# Patient Record
Sex: Male | Born: 1935 | Race: White | Hispanic: No | Marital: Married | Smoking: Former smoker
Health system: Southern US, Community
[De-identification: ages and names within clinical notes are randomized; demographics above are authoritative.]

## PROBLEM LIST (undated history)

## (undated) DIAGNOSIS — M1990 Unspecified osteoarthritis, unspecified site: Secondary | ICD-10-CM

## (undated) DIAGNOSIS — N401 Enlarged prostate with lower urinary tract symptoms: Secondary | ICD-10-CM

## (undated) DIAGNOSIS — F4321 Adjustment disorder with depressed mood: Secondary | ICD-10-CM

## (undated) DIAGNOSIS — E785 Hyperlipidemia, unspecified: Secondary | ICD-10-CM

## (undated) DIAGNOSIS — K254 Chronic or unspecified gastric ulcer with hemorrhage: Secondary | ICD-10-CM

## (undated) DIAGNOSIS — Z8719 Personal history of other diseases of the digestive system: Secondary | ICD-10-CM

## (undated) DIAGNOSIS — G20A1 Parkinson's disease without dyskinesia, without mention of fluctuations: Secondary | ICD-10-CM

## (undated) DIAGNOSIS — M459 Ankylosing spondylitis of unspecified sites in spine: Secondary | ICD-10-CM

## (undated) DIAGNOSIS — K2211 Ulcer of esophagus with bleeding: Secondary | ICD-10-CM

## (undated) DIAGNOSIS — K219 Gastro-esophageal reflux disease without esophagitis: Secondary | ICD-10-CM

## (undated) DIAGNOSIS — K264 Chronic or unspecified duodenal ulcer with hemorrhage: Secondary | ICD-10-CM

## (undated) DIAGNOSIS — Z9289 Personal history of other medical treatment: Secondary | ICD-10-CM

## (undated) DIAGNOSIS — K279 Peptic ulcer, site unspecified, unspecified as acute or chronic, without hemorrhage or perforation: Secondary | ICD-10-CM

## (undated) DIAGNOSIS — R7309 Other abnormal glucose: Secondary | ICD-10-CM

## (undated) DIAGNOSIS — G459 Transient cerebral ischemic attack, unspecified: Secondary | ICD-10-CM

## (undated) DIAGNOSIS — N200 Calculus of kidney: Secondary | ICD-10-CM

## (undated) DIAGNOSIS — I10 Essential (primary) hypertension: Secondary | ICD-10-CM

## (undated) HISTORY — DX: Hyperlipidemia, unspecified: E78.5

## (undated) HISTORY — DX: Transient cerebral ischemic attack, unspecified: G45.9

## (undated) HISTORY — DX: Other abnormal glucose: R73.09

## (undated) HISTORY — DX: Gastro-esophageal reflux disease without esophagitis: K21.9

## (undated) HISTORY — DX: Unspecified osteoarthritis, unspecified site: M19.90

## (undated) HISTORY — DX: Essential (primary) hypertension: I10

## (undated) HISTORY — DX: Peptic ulcer, site unspecified, unspecified as acute or chronic, without hemorrhage or perforation: K27.9

## (undated) HISTORY — DX: Benign prostatic hyperplasia with lower urinary tract symptoms: N40.1

---

## 1986-08-26 DIAGNOSIS — Z9289 Personal history of other medical treatment: Secondary | ICD-10-CM

## 1986-08-26 HISTORY — DX: Personal history of other medical treatment: Z92.89

## 1998-04-17 ENCOUNTER — Ambulatory Visit (HOSPITAL_COMMUNITY): Admission: RE | Admit: 1998-04-17 | Discharge: 1998-04-17 | Payer: Self-pay | Admitting: Gastroenterology

## 2002-01-10 ENCOUNTER — Encounter: Payer: Self-pay | Admitting: Emergency Medicine

## 2002-01-10 ENCOUNTER — Emergency Department (HOSPITAL_COMMUNITY): Admission: EM | Admit: 2002-01-10 | Discharge: 2002-01-10 | Payer: Self-pay | Admitting: Emergency Medicine

## 2002-01-12 ENCOUNTER — Encounter: Payer: Self-pay | Admitting: Neurology

## 2002-01-12 ENCOUNTER — Ambulatory Visit (HOSPITAL_COMMUNITY): Admission: RE | Admit: 2002-01-12 | Discharge: 2002-01-12 | Payer: Self-pay | Admitting: Neurology

## 2003-03-28 ENCOUNTER — Ambulatory Visit (HOSPITAL_COMMUNITY): Admission: RE | Admit: 2003-03-28 | Discharge: 2003-03-28 | Payer: Self-pay | Admitting: Gastroenterology

## 2005-07-12 ENCOUNTER — Ambulatory Visit: Payer: Self-pay | Admitting: Internal Medicine

## 2005-09-02 ENCOUNTER — Ambulatory Visit: Payer: Self-pay | Admitting: Internal Medicine

## 2006-03-25 ENCOUNTER — Ambulatory Visit: Payer: Self-pay | Admitting: Internal Medicine

## 2006-03-31 ENCOUNTER — Ambulatory Visit: Payer: Self-pay | Admitting: Internal Medicine

## 2006-04-24 ENCOUNTER — Ambulatory Visit: Payer: Self-pay | Admitting: Internal Medicine

## 2006-10-17 ENCOUNTER — Ambulatory Visit: Payer: Self-pay | Admitting: Internal Medicine

## 2007-11-04 ENCOUNTER — Ambulatory Visit: Payer: Self-pay | Admitting: Vascular Surgery

## 2007-11-04 ENCOUNTER — Inpatient Hospital Stay (HOSPITAL_COMMUNITY): Admission: EM | Admit: 2007-11-04 | Discharge: 2007-11-04 | Payer: Self-pay | Admitting: Emergency Medicine

## 2007-11-04 ENCOUNTER — Encounter (INDEPENDENT_AMBULATORY_CARE_PROVIDER_SITE_OTHER): Payer: Self-pay | Admitting: Pediatrics

## 2007-11-04 ENCOUNTER — Ambulatory Visit: Payer: Self-pay | Admitting: Cardiology

## 2007-11-17 DIAGNOSIS — Z87442 Personal history of urinary calculi: Secondary | ICD-10-CM | POA: Insufficient documentation

## 2007-11-17 DIAGNOSIS — K279 Peptic ulcer, site unspecified, unspecified as acute or chronic, without hemorrhage or perforation: Secondary | ICD-10-CM

## 2007-11-17 HISTORY — DX: Peptic ulcer, site unspecified, unspecified as acute or chronic, without hemorrhage or perforation: K27.9

## 2007-11-18 ENCOUNTER — Ambulatory Visit: Payer: Self-pay | Admitting: Internal Medicine

## 2007-11-18 DIAGNOSIS — R7309 Other abnormal glucose: Secondary | ICD-10-CM

## 2007-11-18 HISTORY — DX: Other abnormal glucose: R73.09

## 2007-11-20 ENCOUNTER — Ambulatory Visit: Payer: Self-pay | Admitting: Internal Medicine

## 2007-11-23 LAB — CONVERTED CEMR LAB
ALT: 27 units/L (ref 0–53)
AST: 31 units/L (ref 0–37)
Albumin: 4.1 g/dL (ref 3.5–5.2)
Alkaline Phosphatase: 48 units/L (ref 39–117)
BUN: 17 mg/dL (ref 6–23)
Bilirubin, Direct: 0.2 mg/dL (ref 0.0–0.3)
CO2: 30 meq/L (ref 19–32)
Calcium: 9.6 mg/dL (ref 8.4–10.5)
Chloride: 105 meq/L (ref 96–112)
Cholesterol: 177 mg/dL (ref 0–200)
Creatinine, Ser: 1 mg/dL (ref 0.4–1.5)
GFR calc Af Amer: 95 mL/min
GFR calc non Af Amer: 79 mL/min
Glucose, Bld: 100 mg/dL — ABNORMAL HIGH (ref 70–99)
HDL: 33.1 mg/dL — ABNORMAL LOW (ref >39.0)
Hgb A1c MFr Bld: 5.6 % (ref 4.6–6.0)
LDL Cholesterol: 127 mg/dL — ABNORMAL HIGH (ref 0–99)
PSA: 0.27 ng/mL (ref 0.10–4.00)
Potassium: 5 meq/L (ref 3.5–5.1)
Sodium: 140 meq/L (ref 135–145)
Total Bilirubin: 1 mg/dL (ref 0.3–1.2)
Total CHOL/HDL Ratio: 5.3
Total Protein: 6.2 g/dL (ref 6.0–8.3)
Triglycerides: 83 mg/dL (ref 0–149)
VLDL: 17 mg/dL (ref 0–40)

## 2007-12-21 ENCOUNTER — Telehealth (INDEPENDENT_AMBULATORY_CARE_PROVIDER_SITE_OTHER): Payer: Self-pay | Admitting: *Deleted

## 2008-02-08 ENCOUNTER — Ambulatory Visit: Payer: Self-pay | Admitting: Internal Medicine

## 2008-02-08 LAB — CONVERTED CEMR LAB
ALT: 24 units/L (ref 0–53)
AST: 20 units/L (ref 0–37)
Albumin: 4 g/dL (ref 3.5–5.2)
Alkaline Phosphatase: 39 units/L (ref 39–117)
Bilirubin, Direct: 0.2 mg/dL (ref 0.0–0.3)
Cholesterol: 105 mg/dL (ref 0–200)
HDL: 35.4 mg/dL — ABNORMAL LOW (ref >39.0)
LDL Cholesterol: 57 mg/dL (ref 0–99)
Total Bilirubin: 1.2 mg/dL (ref 0.3–1.2)
Total CHOL/HDL Ratio: 3
Total Protein: 5.9 g/dL — ABNORMAL LOW (ref 6.0–8.3)
Triglycerides: 61 mg/dL (ref 0–149)
VLDL: 12 mg/dL (ref 0–40)

## 2008-02-22 ENCOUNTER — Ambulatory Visit: Payer: Self-pay | Admitting: Internal Medicine

## 2008-02-22 DIAGNOSIS — E785 Hyperlipidemia, unspecified: Secondary | ICD-10-CM

## 2008-02-22 DIAGNOSIS — K219 Gastro-esophageal reflux disease without esophagitis: Secondary | ICD-10-CM | POA: Insufficient documentation

## 2008-02-22 HISTORY — DX: Gastro-esophageal reflux disease without esophagitis: K21.9

## 2008-02-22 HISTORY — DX: Hyperlipidemia, unspecified: E78.5

## 2008-02-23 ENCOUNTER — Ambulatory Visit: Payer: Self-pay | Admitting: Internal Medicine

## 2008-07-26 ENCOUNTER — Encounter: Payer: Self-pay | Admitting: Internal Medicine

## 2008-08-09 ENCOUNTER — Ambulatory Visit: Payer: Self-pay | Admitting: Internal Medicine

## 2008-08-09 LAB — CONVERTED CEMR LAB
ALT: 26 units/L (ref 0–53)
AST: 25 units/L (ref 0–37)
Albumin: 4.2 g/dL (ref 3.5–5.2)
Alkaline Phosphatase: 48 units/L (ref 39–117)
BUN: 20 mg/dL (ref 6–23)
Bilirubin, Direct: 0.2 mg/dL (ref 0.0–0.3)
CO2: 31 meq/L (ref 19–32)
Calcium: 9.6 mg/dL (ref 8.4–10.5)
Chloride: 106 meq/L (ref 96–112)
Cholesterol: 120 mg/dL (ref 0–200)
Creatinine, Ser: 1 mg/dL (ref 0.4–1.5)
GFR calc Af Amer: 95 mL/min
GFR calc non Af Amer: 79 mL/min
Glucose, Bld: 98 mg/dL (ref 70–99)
HDL: 45.4 mg/dL (ref >39.0)
Hgb A1c MFr Bld: 5.7 % (ref 4.6–6.0)
LDL Cholesterol: 63 mg/dL (ref 0–99)
Potassium: 4.7 meq/L (ref 3.5–5.1)
Sodium: 143 meq/L (ref 135–145)
Total Bilirubin: 1.1 mg/dL (ref 0.3–1.2)
Total CHOL/HDL Ratio: 2.6
Total Protein: 6.3 g/dL (ref 6.0–8.3)
Triglycerides: 57 mg/dL (ref 0–149)
VLDL: 11 mg/dL (ref 0–40)

## 2008-08-29 ENCOUNTER — Ambulatory Visit: Payer: Self-pay | Admitting: Internal Medicine

## 2008-08-29 LAB — HM COLONOSCOPY

## 2008-09-15 ENCOUNTER — Encounter: Payer: Self-pay | Admitting: Internal Medicine

## 2009-02-28 ENCOUNTER — Ambulatory Visit: Payer: Self-pay | Admitting: Internal Medicine

## 2009-02-28 LAB — CONVERTED CEMR LAB
ALT: 20 units/L (ref 0–53)
AST: 17 units/L (ref 0–37)
Albumin: 4 g/dL (ref 3.5–5.2)
Alkaline Phosphatase: 47 units/L (ref 39–117)
BUN: 15 mg/dL (ref 6–23)
Bilirubin, Direct: 0.2 mg/dL (ref 0.0–0.3)
CO2: 29 meq/L (ref 19–32)
Calcium: 9.2 mg/dL (ref 8.4–10.5)
Chloride: 105 meq/L (ref 96–112)
Cholesterol: 113 mg/dL (ref 0–200)
Creatinine, Ser: 1 mg/dL (ref 0.4–1.5)
GFR calc non Af Amer: 78.21 mL/min (ref >60)
Glucose, Bld: 88 mg/dL (ref 70–99)
HDL: 43.4 mg/dL (ref >39.00)
Hgb A1c MFr Bld: 5.6 % (ref 4.6–6.5)
LDL Cholesterol: 60 mg/dL (ref 0–99)
Potassium: 4 meq/L (ref 3.5–5.1)
Sodium: 140 meq/L (ref 135–145)
Total Bilirubin: 1.5 mg/dL — ABNORMAL HIGH (ref 0.3–1.2)
Total CHOL/HDL Ratio: 3
Total Protein: 6.3 g/dL (ref 6.0–8.3)
Triglycerides: 46 mg/dL (ref 0.0–149.0)
VLDL: 9.2 mg/dL (ref 0.0–40.0)

## 2009-03-08 ENCOUNTER — Ambulatory Visit: Payer: Self-pay | Admitting: Internal Medicine

## 2009-05-26 ENCOUNTER — Telehealth (INDEPENDENT_AMBULATORY_CARE_PROVIDER_SITE_OTHER): Payer: Self-pay | Admitting: *Deleted

## 2009-08-24 ENCOUNTER — Telehealth: Payer: Self-pay | Admitting: Internal Medicine

## 2009-09-07 ENCOUNTER — Ambulatory Visit: Payer: Self-pay | Admitting: Internal Medicine

## 2009-09-07 LAB — CONVERTED CEMR LAB
ALT: 29 units/L (ref 0–53)
AST: 23 units/L (ref 0–37)
Albumin: 4.5 g/dL (ref 3.5–5.2)
Alkaline Phosphatase: 50 units/L (ref 39–117)
BUN: 14 mg/dL (ref 6–23)
Bilirubin, Direct: 0.2 mg/dL (ref 0.0–0.3)
CO2: 29 meq/L (ref 19–32)
Calcium: 9.3 mg/dL (ref 8.4–10.5)
Chloride: 100 meq/L (ref 96–112)
Cholesterol: 116 mg/dL (ref 0–200)
Creatinine, Ser: 1 mg/dL (ref 0.4–1.5)
GFR calc non Af Amer: 78.1 mL/min (ref >60)
Glucose, Bld: 94 mg/dL (ref 70–99)
HDL: 41.7 mg/dL (ref >39.00)
Hgb A1c MFr Bld: 5.5 % (ref 4.6–6.5)
LDL Cholesterol: 61 mg/dL (ref 0–99)
Potassium: 4.3 meq/L (ref 3.5–5.1)
Sodium: 135 meq/L (ref 135–145)
Total Bilirubin: 1.3 mg/dL — ABNORMAL HIGH (ref 0.3–1.2)
Total CHOL/HDL Ratio: 3
Total Protein: 7.1 g/dL (ref 6.0–8.3)
Triglycerides: 67 mg/dL (ref 0.0–149.0)
VLDL: 13.4 mg/dL (ref 0.0–40.0)

## 2009-09-14 ENCOUNTER — Ambulatory Visit: Payer: Self-pay | Admitting: Internal Medicine

## 2009-09-18 ENCOUNTER — Telehealth (INDEPENDENT_AMBULATORY_CARE_PROVIDER_SITE_OTHER): Payer: Self-pay | Admitting: *Deleted

## 2009-09-19 ENCOUNTER — Ambulatory Visit: Payer: Self-pay | Admitting: Cardiovascular Disease

## 2009-09-19 ENCOUNTER — Encounter (HOSPITAL_COMMUNITY): Admission: RE | Admit: 2009-09-19 | Discharge: 2009-11-29 | Payer: Self-pay | Admitting: Internal Medicine

## 2009-09-19 ENCOUNTER — Ambulatory Visit: Payer: Self-pay

## 2009-10-17 ENCOUNTER — Ambulatory Visit: Payer: Self-pay | Admitting: Cardiology

## 2009-10-24 ENCOUNTER — Telehealth: Payer: Self-pay | Admitting: Cardiology

## 2009-10-24 ENCOUNTER — Ambulatory Visit (HOSPITAL_COMMUNITY): Admission: RE | Admit: 2009-10-24 | Discharge: 2009-10-24 | Payer: Self-pay | Admitting: Cardiology

## 2009-10-25 ENCOUNTER — Telehealth: Payer: Self-pay | Admitting: Cardiology

## 2009-11-01 ENCOUNTER — Telehealth: Payer: Self-pay | Admitting: Cardiology

## 2009-11-03 ENCOUNTER — Ambulatory Visit: Payer: Self-pay | Admitting: Cardiology

## 2009-11-03 ENCOUNTER — Ambulatory Visit: Payer: Self-pay

## 2009-11-03 ENCOUNTER — Ambulatory Visit (HOSPITAL_COMMUNITY): Admission: RE | Admit: 2009-11-03 | Discharge: 2009-11-03 | Payer: Self-pay | Admitting: Cardiology

## 2009-11-03 ENCOUNTER — Encounter: Payer: Self-pay | Admitting: Cardiology

## 2009-11-05 LAB — CONVERTED CEMR LAB
BUN: 18 mg/dL (ref 6–23)
CO2: 28 meq/L (ref 19–32)
Calcium: 9.8 mg/dL (ref 8.4–10.5)
Chloride: 105 meq/L (ref 96–112)
Creatinine, Ser: 1.2 mg/dL (ref 0.4–1.5)
GFR calc non Af Amer: 63.25 mL/min (ref >60)
Glucose, Bld: 107 mg/dL — ABNORMAL HIGH (ref 70–99)
Potassium: 4 meq/L (ref 3.5–5.1)
Sodium: 139 meq/L (ref 135–145)

## 2009-11-06 ENCOUNTER — Ambulatory Visit: Payer: Self-pay | Admitting: Internal Medicine

## 2009-11-08 ENCOUNTER — Telehealth: Payer: Self-pay | Admitting: Cardiology

## 2009-11-10 ENCOUNTER — Ambulatory Visit: Payer: Self-pay | Admitting: Cardiology

## 2009-11-10 DIAGNOSIS — I10 Essential (primary) hypertension: Secondary | ICD-10-CM

## 2009-11-10 HISTORY — DX: Essential (primary) hypertension: I10

## 2009-11-23 ENCOUNTER — Telehealth: Payer: Self-pay | Admitting: Cardiology

## 2010-01-15 ENCOUNTER — Ambulatory Visit: Payer: Self-pay | Admitting: Internal Medicine

## 2010-01-15 DIAGNOSIS — N401 Enlarged prostate with lower urinary tract symptoms: Secondary | ICD-10-CM

## 2010-01-15 DIAGNOSIS — N138 Other obstructive and reflux uropathy: Secondary | ICD-10-CM | POA: Insufficient documentation

## 2010-01-15 HISTORY — DX: Other obstructive and reflux uropathy: N13.8

## 2010-01-15 HISTORY — DX: Benign prostatic hyperplasia with lower urinary tract symptoms: N40.1

## 2010-01-15 LAB — CONVERTED CEMR LAB
Bilirubin Urine: NEGATIVE
Blood in Urine, dipstick: NEGATIVE
Glucose, Urine, Semiquant: NEGATIVE
Ketones, urine, test strip: NEGATIVE
Nitrite: NEGATIVE
Protein, U semiquant: NEGATIVE
Specific Gravity, Urine: 1.02
Urobilinogen, UA: 0.2
WBC Urine, dipstick: NEGATIVE
pH: 7

## 2010-01-16 ENCOUNTER — Encounter: Payer: Self-pay | Admitting: Internal Medicine

## 2010-01-16 LAB — CONVERTED CEMR LAB: PSA: 0.38 ng/mL (ref 0.10–4.00)

## 2010-02-16 ENCOUNTER — Ambulatory Visit: Payer: Self-pay | Admitting: Internal Medicine

## 2010-04-02 ENCOUNTER — Encounter: Payer: Self-pay | Admitting: Internal Medicine

## 2010-04-06 ENCOUNTER — Encounter: Admission: RE | Admit: 2010-04-06 | Discharge: 2010-04-06 | Payer: Self-pay | Admitting: Gastroenterology

## 2010-05-01 ENCOUNTER — Encounter: Payer: Self-pay | Admitting: Internal Medicine

## 2010-05-04 ENCOUNTER — Telehealth: Payer: Self-pay | Admitting: Cardiology

## 2010-05-04 ENCOUNTER — Ambulatory Visit: Payer: Self-pay | Admitting: Cardiology

## 2010-06-22 ENCOUNTER — Ambulatory Visit: Payer: Self-pay | Admitting: Internal Medicine

## 2010-06-22 LAB — CONVERTED CEMR LAB
ALT: 20 units/L (ref 0–53)
AST: 19 units/L (ref 0–37)
Albumin: 4.4 g/dL (ref 3.5–5.2)
Alkaline Phosphatase: 50 units/L (ref 39–117)
BUN: 26 mg/dL — ABNORMAL HIGH (ref 6–23)
Bilirubin, Direct: 0.1 mg/dL (ref 0.0–0.3)
CO2: 29 meq/L (ref 19–32)
Calcium: 9.8 mg/dL (ref 8.4–10.5)
Chloride: 103 meq/L (ref 96–112)
Cholesterol: 165 mg/dL (ref 0–200)
Creatinine, Ser: 1.1 mg/dL (ref 0.4–1.5)
GFR calc non Af Amer: 69.08 mL/min (ref >60)
Glucose, Bld: 94 mg/dL (ref 70–99)
HDL: 50.3 mg/dL (ref >39.00)
Hgb A1c MFr Bld: 5.5 % (ref 4.6–6.5)
LDL Cholesterol: 104 mg/dL — ABNORMAL HIGH (ref 0–99)
Potassium: 4.7 meq/L (ref 3.5–5.1)
Sodium: 138 meq/L (ref 135–145)
TSH: 4.64 microintl units/mL (ref 0.35–5.50)
Total Bilirubin: 1.2 mg/dL (ref 0.3–1.2)
Total CHOL/HDL Ratio: 3
Total Protein: 6.8 g/dL (ref 6.0–8.3)
Triglycerides: 55 mg/dL (ref 0.0–149.0)
VLDL: 11 mg/dL (ref 0.0–40.0)

## 2010-09-11 ENCOUNTER — Inpatient Hospital Stay (HOSPITAL_COMMUNITY)
Admission: EM | Admit: 2010-09-11 | Discharge: 2010-09-13 | Payer: Self-pay | Source: Home / Self Care | Attending: Internal Medicine | Admitting: Internal Medicine

## 2010-09-12 ENCOUNTER — Encounter (INDEPENDENT_AMBULATORY_CARE_PROVIDER_SITE_OTHER): Payer: Self-pay | Admitting: Internal Medicine

## 2010-09-12 LAB — COMPREHENSIVE METABOLIC PANEL
ALT: 17 U/L (ref 0–53)
AST: 20 U/L (ref 0–37)
Albumin: 4.3 g/dL (ref 3.5–5.2)
Alkaline Phosphatase: 48 U/L (ref 39–117)
BUN: 15 mg/dL (ref 6–23)
CO2: 27 mEq/L (ref 19–32)
Calcium: 9.8 mg/dL (ref 8.4–10.5)
Chloride: 99 mEq/L (ref 96–112)
Creatinine, Ser: 1.15 mg/dL (ref 0.4–1.5)
GFR calc Af Amer: >60 mL/min (ref >60)
GFR calc non Af Amer: >60 mL/min (ref >60)
Glucose, Bld: 109 mg/dL — ABNORMAL HIGH (ref 70–99)
Potassium: 4 mEq/L (ref 3.5–5.1)
Sodium: 136 mEq/L (ref 135–145)
Total Bilirubin: 1.8 mg/dL — ABNORMAL HIGH (ref 0.3–1.2)
Total Protein: 6.7 g/dL (ref 6.0–8.3)

## 2010-09-12 LAB — POCT CARDIAC MARKERS
CKMB, poc: <1 ng/mL — ABNORMAL LOW (ref 1.0–8.0)
Myoglobin, poc: 70.9 ng/mL (ref 12–200)
Troponin i, poc: <0.05 ng/mL (ref 0.00–0.09)

## 2010-09-12 LAB — DIFFERENTIAL
Basophils Absolute: 0 10*3/uL (ref 0.0–0.1)
Basophils Relative: 0 % (ref 0–1)
Eosinophils Absolute: 0 10*3/uL (ref 0.0–0.7)
Eosinophils Relative: 0 % (ref 0–5)
Lymphocytes Relative: 22 % (ref 12–46)
Lymphs Abs: 1.7 10*3/uL (ref 0.7–4.0)
Monocytes Absolute: 0.6 10*3/uL (ref 0.1–1.0)
Monocytes Relative: 8 % (ref 3–12)
Neutro Abs: 5.2 10*3/uL (ref 1.7–7.7)
Neutrophils Relative %: 69 % (ref 43–77)

## 2010-09-12 LAB — CBC
HCT: 40.5 % (ref 39.0–52.0)
Hemoglobin: 13.8 g/dL (ref 13.0–17.0)
MCH: 32 pg (ref 26.0–34.0)
MCHC: 34.1 g/dL (ref 30.0–36.0)
MCV: 94 fL (ref 78.0–100.0)
Platelets: 224 10*3/uL (ref 150–400)
RBC: 4.31 MIL/uL (ref 4.22–5.81)
RDW: 12.1 % (ref 11.5–15.5)
WBC: 7.5 10*3/uL (ref 4.0–10.5)

## 2010-09-12 LAB — LIPASE, BLOOD: Lipase: 21 U/L (ref 11–59)

## 2010-09-13 ENCOUNTER — Encounter: Payer: Self-pay | Admitting: Internal Medicine

## 2010-09-17 LAB — DIFFERENTIAL
Basophils Absolute: 0 10*3/uL (ref 0.0–0.1)
Basophils Relative: 0 % (ref 0–1)
Eosinophils Absolute: 0 10*3/uL (ref 0.0–0.7)
Eosinophils Relative: 1 % (ref 0–5)
Lymphocytes Relative: 30 % (ref 12–46)
Lymphs Abs: 1.8 10*3/uL (ref 0.7–4.0)
Monocytes Absolute: 0.6 10*3/uL (ref 0.1–1.0)
Monocytes Relative: 10 % (ref 3–12)
Neutro Abs: 3.5 10*3/uL (ref 1.7–7.7)
Neutrophils Relative %: 59 % (ref 43–77)

## 2010-09-17 LAB — CARDIAC PANEL(CRET KIN+CKTOT+MB+TROPI)
CK, MB: 2.5 ng/mL (ref 0.3–4.0)
CK, MB: 2.2 ng/mL (ref 0.3–4.0)
Relative Index: INVALID (ref 0.0–2.5)
Relative Index: INVALID (ref 0.0–2.5)
Total CK: 60 U/L (ref 7–232)
Total CK: 58 U/L (ref 7–232)
Troponin I: 0.01 ng/mL (ref 0.00–0.06)
Troponin I: 0.01 ng/mL (ref 0.00–0.06)

## 2010-09-17 LAB — LIPID PANEL
Cholesterol: 161 mg/dL (ref 0–200)
HDL: 50 mg/dL (ref >39)
LDL Cholesterol: 100 mg/dL — ABNORMAL HIGH (ref 0–99)
Total CHOL/HDL Ratio: 3.2 RATIO
Triglycerides: 55 mg/dL (ref <150)
VLDL: 11 mg/dL (ref 0–40)

## 2010-09-17 LAB — OCCULT BLOOD, POC DEVICE
Fecal Occult Bld: NEGATIVE
Fecal Occult Bld: NEGATIVE

## 2010-09-17 LAB — COMPREHENSIVE METABOLIC PANEL
ALT: 16 U/L (ref 0–53)
AST: 18 U/L (ref 0–37)
Albumin: 3.9 g/dL (ref 3.5–5.2)
Alkaline Phosphatase: 43 U/L (ref 39–117)
BUN: 14 mg/dL (ref 6–23)
CO2: 27 mEq/L (ref 19–32)
Calcium: 9.3 mg/dL (ref 8.4–10.5)
Chloride: 106 mEq/L (ref 96–112)
Creatinine, Ser: 1.06 mg/dL (ref 0.4–1.5)
GFR calc Af Amer: >60 mL/min (ref >60)
GFR calc non Af Amer: >60 mL/min (ref >60)
Glucose, Bld: 91 mg/dL (ref 70–99)
Potassium: 4.4 mEq/L (ref 3.5–5.1)
Sodium: 139 mEq/L (ref 135–145)
Total Bilirubin: 1.9 mg/dL — ABNORMAL HIGH (ref 0.3–1.2)
Total Protein: 6 g/dL (ref 6.0–8.3)

## 2010-09-17 LAB — BASIC METABOLIC PANEL
BUN: 13 mg/dL (ref 6–23)
CO2: 27 mEq/L (ref 19–32)
Calcium: 8.7 mg/dL (ref 8.4–10.5)
Chloride: 109 mEq/L (ref 96–112)
Creatinine, Ser: 1.02 mg/dL (ref 0.4–1.5)
GFR calc Af Amer: >60 mL/min (ref >60)
GFR calc non Af Amer: >60 mL/min (ref >60)
Glucose, Bld: 94 mg/dL (ref 70–99)
Potassium: 4.6 mEq/L (ref 3.5–5.1)
Sodium: 139 mEq/L (ref 135–145)

## 2010-09-17 LAB — CBC
HCT: 38.7 % — ABNORMAL LOW (ref 39.0–52.0)
HCT: 32.7 % — ABNORMAL LOW (ref 39.0–52.0)
Hemoglobin: 13.2 g/dL (ref 13.0–17.0)
Hemoglobin: 11.3 g/dL — ABNORMAL LOW (ref 13.0–17.0)
MCH: 32.1 pg (ref 26.0–34.0)
MCH: 32.8 pg (ref 26.0–34.0)
MCHC: 34.1 g/dL (ref 30.0–36.0)
MCHC: 34.6 g/dL (ref 30.0–36.0)
MCV: 94.2 fL (ref 78.0–100.0)
MCV: 94.8 fL (ref 78.0–100.0)
Platelets: 202 10*3/uL (ref 150–400)
Platelets: 177 10*3/uL (ref 150–400)
RBC: 4.11 MIL/uL — ABNORMAL LOW (ref 4.22–5.81)
RBC: 3.45 MIL/uL — ABNORMAL LOW (ref 4.22–5.81)
RDW: 12.3 % (ref 11.5–15.5)
RDW: 12.6 % (ref 11.5–15.5)
WBC: 5.9 10*3/uL (ref 4.0–10.5)
WBC: 4.7 10*3/uL (ref 4.0–10.5)

## 2010-09-17 LAB — TSH: TSH: 5.587 u[IU]/mL — ABNORMAL HIGH (ref 0.350–4.500)

## 2010-09-17 LAB — HEMOGLOBIN A1C
Hgb A1c MFr Bld: 5.6 % (ref <5.7)
Mean Plasma Glucose: 114 mg/dL (ref <117)

## 2010-09-17 LAB — PHOSPHORUS: Phosphorus: 3.3 mg/dL (ref 2.3–4.6)

## 2010-09-17 LAB — CK TOTAL AND CKMB (NOT AT ARMC)
CK, MB: 2.7 ng/mL (ref 0.3–4.0)
Relative Index: INVALID (ref 0.0–2.5)
Total CK: 68 U/L (ref 7–232)

## 2010-09-17 LAB — MAGNESIUM: Magnesium: 2 mg/dL (ref 1.5–2.5)

## 2010-09-17 LAB — TROPONIN I: Troponin I: <0.01 ng/mL (ref 0.00–0.06)

## 2010-09-22 NOTE — H&P (Signed)
[name], [name]                 ACCOUNT NO.:  [id number]  MEDICAL RECORD NO.:  [id number]          PATIENT TYPE:  EMS  LOCATION:  MAJO                         FACILITY:  [facility]  PHYSICIAN:  Michiel Cowboy, MDDATE OF BIRTH:  08-05-1936  DATE OF ADMISSION:  09/11/2010 DATE OF DISCHARGE:                             HISTORY & PHYSICAL   PRIMARY CARE PROVIDER:  Valetta Mole. Swords, MD  CHIEF COMPLAINT:  Chest pressure.  The patient is a 75 year old gentleman who for the past 1 year having intermittent episodes of nausea, early satiety, dyspepsia that go on for couple of days and then resolve.  This happens every other week or so every couple weeks.  He thinks it is secondary to this.  He has lost almost 30 pounds in the past year.  He was supposed to go see Dr. Corliss Marcus about this and have an appointment scheduled for tomorrow. Regarding his chest pressure, this started only today.  He has not had any shortness of breath.  It is completely resolved now.  It was nonexertional, occurred at rest, and was mild, but it got him scared so he went to the emergency department.  Otherwise, no diarrhea.  Nausea is currently improved, although he does not still have much of an appetite. Otherwise, review of systems is negative.  No fevers, no chills, no chest pain per se.  Otherwise, review of systems is negative.  PAST MEDICAL HISTORY:  Significant for: 1. Hypertension. 2. GERD. 3. Remote GI bleeding in 1988 secondary to peptic ulcer disease and     history of esophagitis.  SOCIAL HISTORY:  The patient used to smoke but quit 30 years ago. Drinks very rarely alcohol.  Does not abuse drugs.  FAMILY HISTORY:  Significant for father with diabetes and colon cancer.  ALLERGIES:  None.  MEDICATIONS: 1. Lisinopril 20 mg daily. 2. Prilosec 20 mg daily. 3. Aspirin 81 mg a day.  PHYSICAL EXAMINATION:  VITAL SIGNS:  Temperature 98.1; blood pressure 165/72; pulse initially was 113, now  down to 94; respirations 20; saturating 94% on room air. GENERAL:  The patient appears to be currently in no acute distress. HEENT:  Head is nontraumatic.  Dry mucous membranes.  Decreased skin turgor. LUNGS:  Good aeration bilaterally.  Clear to auscultation bilaterally. HEART:  Regular rate and rhythm.  No murmurs appreciated. ABDOMEN:  Soft, nontender, and nondistended. EXTREMITIES:  Lower extremities without clubbing, cyanosis, or edema. NEUROLOGIC:  Grossly intact.  LABORATORY DATA:  White blood cell count 7.5 and hemoglobin 13.8. Sodium 136, potassium 4.0, creatinine 1.15.  Total bilirubin slightly elevated at 1.8, but otherwise LFTs within normal limits.  Albumin 4.3 and lipase 21.  Cardiac enzymes unremarkable.  Chest x-ray is not showing abnormalities.  Hemoccult negative.  EKG initially shows heart rate of 122, but no evidence of ischemia.  ASSESSMENT AND PLAN:  This is a 75 year old gentleman with intermittent nausea of unclear etiology and now chest pain.  The patient does appear to be also mildly dehydrated. 1. Chest pressure.  given his age and history of hypertension, we will     cycle cardiac  enzymes.  He has had a stress test done by [facility]     Cardiology in February which he is not sure of results.  He never     had a cardiac catheterization done.  I unfortunately did not have     access to the stress test, can recommend Cardiology consult or     follow up to see if they would like to do more studies.  For now,     we will further risk stratify with fasting lipid panel and     hemoglobin A1c. 2. Nausea, unknown etiology.  This has been recurrent.  I would     recommend for him to follow up with GI as he was put in the past.     He had had a negative colonoscopy.  Right now, nausea is improved.     We will try to continue to control with Zofran.  This course has     been recurrent and self resolving.  It may be diet related.  He has     had some heavy meat intake  in the past few days which may have     initiated this.  It is possible as he may need further GI workup.     For now, as his symptoms are improving, we will hold off. 3. Hypertension.  Continue lisinopril for now.  His blood pressure is     improving. 4. Prophylaxis.  Protonix and SCDs given his history of     gastrointestinal bleed.     Michiel Cowboy, MD     AVD/MEDQ  D:  09/12/2010  T:  09/12/2010  Job:  [id number]  cc:   Valetta Mole. Swords, MD  Electronically Signed by Therisa Doyne MD on 09/22/2010 06:22:12 AM

## 2010-09-23 LAB — CONVERTED CEMR LAB
BUN: 15 mg/dL (ref 6–23)
CO2: 31 meq/L (ref 19–32)
Calcium: 9.8 mg/dL (ref 8.4–10.5)
Chloride: 105 meq/L (ref 96–112)
Creatinine, Ser: 1 mg/dL (ref 0.4–1.5)
GFR calc non Af Amer: 78.07 mL/min (ref >60)
Glucose, Bld: 103 mg/dL — ABNORMAL HIGH (ref 70–99)
Potassium: 4.9 meq/L (ref 3.5–5.1)
Sodium: 142 meq/L (ref 135–145)

## 2010-09-24 NOTE — Discharge Summary (Addendum)
[name], [name]                 ACCOUNT NO.:  [id number]  MEDICAL RECORD NO.:  [id number]          PATIENT TYPE:  INP  LOCATION:  4731                         FACILITY:  [facility]  PHYSICIAN:  Peggye Pitt, M.D. DATE OF BIRTH:  08-08-1936  DATE OF ADMISSION:  09/12/2010 DATE OF DISCHARGE:  09/13/2010                              DISCHARGE SUMMARY   PRIMARY CARE PHYSICIAN:  Valetta Mole. Swords, MD  GASTROENTEROLOGIST:  Dr. Evette Georges  DISCHARGE DIAGNOSES: 1. Chest pressure, ruled out for acute coronary syndrome by the way of     3 sets of negative cardiac enzymes and EKGs.  Believe likely     secondary to gastrointestinal etiology. 2. History of hypertension. 3. Gastroesophageal reflux disease. 4. History of peptic ulcer disease and esophagitis in 1988. 5. Chronic diastolic congestive heart failure.  DISCHARGE MEDICATIONS: 1. Aspirin 81 mg daily. 2. Krill oil over-the-counter 1 tablet daily. 3. Lisinopril 20/hydrochlorothiazide 12.5 mg 1 tablet daily. 4. Multivitamin over-the-counter 1 tablet daily. 5. Prilosec 20 mg twice daily. 6. Vitamin D3 1000 units over-the-counter daily.  DISPOSITION AND FOLLOWUP:  [name] will be discharged home today in stable condition.  He is instructed to follow up with his GI physician and his PCP as previously scheduled.  CONSULTATION THIS HOSPITALIZATION:  None.  IMAGES AND PROCEDURES: 1. Chest x-ray on September 11, 2010, that showed no infiltrate,     congestive heart failure, or pneumothorax. 2. A 2-D echocardiogram on September 12, 2010, that showed mild LVH with     an ejection fraction of 55-60% and grade 2 diastolic dysfunction.  HISTORY OF PRESENT ILLNESS:  For details, please refer to dictation on September 12, 2009, by Dr. Adela Glimpse, but in brief [name] is a very pleasant 75 year old Caucasian gentleman who for the past year has been having episodes of nausea, early satiety, and dyspepsia and has currently been undergoing work up  with Dr. Evette Georges with GI.  He came into the hospital because of chest pressure without shortness of breath that had completely resolved by the time of his arrival to the emergency department.  HOSPITAL COURSE BY PROBLEM: 1. Chest pressure:  He has ruled out for acute coronary syndrome.  I     believe at this point that his chest pressure is most likely     related to a GI etiology.  He has been instructed to follow up with     his GI doctor, Dr. Randa Evens in the outpatient setting for further     diagnosis and treatment.  His only coronary artery disease risk     factor is hypertension, I do not believe at this point that he     requires any further cardiac workup unless his chest pain symptoms     were to continue. 2. Hypertension:  His blood pressure has been well controlled while in     the hospital.  We have continued his home medications. 3. History of GERD and peptic ulcer disease:  He is to continue his     home regimen of Prilosec. 4. Other rest of his chronic conditions have been stable.  VITAL SIGNS ON DAY OF DISCHARGE:  Blood pressure 112/54, heart rate 85, respirations 16, sats of 100% on room air, and a temp of 97.8.     Peggye Pitt, M.D.     EH/MEDQ  D:  09/13/2010  T:  09/14/2010  Job:  [id number]  cc:   Valetta Mole. Swords, MD Dr. Evette Georges  Electronically Signed by Peggye Pitt M.D. on 09/24/2010 07:52:49 AM

## 2010-09-27 NOTE — Progress Notes (Signed)
Summary: FYI-Podiatry referral  Phone Note Call from Patient   Summary of Call: Podiatry referral on 09/15/2008-patient was seen and treated at Dr.Eagerton's office. They will fax a copy of report. Initial call taken by: Darra Lis RMA,  May 26, 2009 1:16 PM

## 2010-09-27 NOTE — Assessment & Plan Note (Signed)
Summary: 6 month rov/njr   Vital Signs:  Patient Profile:   75 Years Old Male Weight:      174 pounds Temp:     98 degrees F Pulse rate:   88 / minute BP sitting:   124 / 66  (left arm)  Vitals Entered By: Gladis Riffle, RN (August 29, 2008 8:51 AM)                 Chief Complaint:  6 month rov and labs done.  History of Present Illness:  Follow-Up Visit      This is a 75 year old man who presents for Follow-up visit.  The patient denies chest pain, palpitations, dizziness, syncope, low blood sugar symptoms, high blood sugar symptoms, edema, SOB, DOE, PND, and orthopnea.  Since the last visit the patient notes no new problems or concerns.  The patient reports taking meds as prescribed.  When questioned about possible medication side effects, the patient notes none.    Past Medical History: Nephrolithiasis, hx of Peptic ulcer disease---led to transfusion Hyperlipidemia GERD  Past Surgical History: blood transfusion from bleeding ulcer Social History: Retired Married Former Smoker Alcohol use-yes Regular exercise-no 3 children 1 son with OSA, obese, 1 son healthy, 1 daughter breast ca age 10 Family History: Father: father died colon ca age 58, DM II Mother: died complications appendicitis age 80 Siblings: 1 brother alive BCCE, 1 sisiter died age 9 lupus Family History of Colon CA 1st degree relative <60 Family History Diabetes 1st degree relative  no other complaints in a complete ROS   Colonoscopy Result Date:  07/26/2008 Colonoscopy Result:  normal-pt's report Colonoscopy Next Due:  5 yr    Updated Prior Medication List: PRILOSEC OTC 20 MG  TBEC (OMEPRAZOLE MAGNESIUM) once daily every morning SIMVASTATIN 40 MG  TABS (SIMVASTATIN) Take 1 tablet by mouth at bedtime ASPIRIN 81 MG TBEC (ASPIRIN) once daily  Current Allergies (reviewed today): No known allergies     Risk Factors:  Colonoscopy History:     Date of Last Colonoscopy:  07/26/2008   Colonoscopy  History:     Date of Last Colonoscopy:  07/26/2008    Results:  normal-pt's report     Physical Exam  General:     Well-developed,well-nourished,in no acute distress; alert,appropriate and cooperative throughout examination Head:     normocephalic and atraumatic.   Eyes:     pupils equal and pupils round.   Ears:     R ear normal and L ear normal.   Neck:     No deformities, masses, or tenderness noted. Chest Wall:     No deformities, masses, tenderness or gynecomastia noted. Lungs:     Normal respiratory effort, chest expands symmetrically. Lungs are clear to auscultation, no crackles or wheezes. Heart:     Normal rate and regular rhythm. S1 and S2 normal without gallop, murmur, click, rub or other extra sounds. Pulses:     R radial normal and L radial normal.   Neurologic:     cranial nerves II-XII intact and gait normal.   Skin:     turgor normal and color normal.    thickened nails bilaterally Psych:     good eye contact and not anxious appearing.      Impression & Recommendations:  Problem # 1:  HYPERLIPIDEMIA (ICD-272.4)  His updated medication list for this problem includes:    Simvastatin 40 Mg Tabs (Simvastatin) .Marland Kitchen... Take 1 tablet by mouth at bedtime  Labs Reviewed:  Chol: 120 (08/09/2008)   HDL: 45.4 (08/09/2008)   LDL: 63 (08/09/2008)   TG: 57 (08/09/2008) SGOT: 25 (08/09/2008)   SGPT: 26 (08/09/2008)   Problem # 2:  GERD (ICD-530.81)  His updated medication list for this problem includes:    Prilosec Otc 20 Mg Tbec (Omeprazole magnesium) ..... 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 Once daily every morning 2)  Simvastatin 40 Mg Tabs (Simvastatin) .... Take 1 tablet by mouth at bedtime 3)  Aspirin 81 Mg Tbec (Aspirin) .... Once  daily   Patient Instructions: 1)  Please schedule a follow-up appointment in 6 months. 2)  labs one week prior to visit 3)  lipids---272.4 4)  lfts-995.2 5)  bmet-995.2 6)  A1C-250.02 7)      ]

## 2010-09-27 NOTE — Assessment & Plan Note (Signed)
Summary: 6 month rov/njr   Vital Signs:  Patient profile:   75 year old male Weight:      166 pounds Temp:     98.4 degrees F Pulse rate:   88 / minute Resp:     14 per minute BP sitting:   150 / 64  (left arm)  Vitals Entered By: Gladis Riffle, RN (March 08, 2009 9:37 AM)  Serial Vital Signs/Assessments:  Time      Position  BP       Pulse  Resp  Temp     By                     125/70                         Birdie Sons MD   History of Present Illness:  Follow-Up Visit      This is a 75 year old man who presents for Follow-up visit.  The patient denies chest pain, palpitations, dizziness, syncope, low blood sugar symptoms, high blood sugar symptoms, edema, SOB, DOE, PND, and orthopnea.  Since the last visit the patient notes no new problems or concerns.  The patient reports taking meds as prescribed.  When questioned about possible medication side effects, the patient notes none.    All other systems reviewed and were negative   Current Problems (verified): 1)  Gerd  (ICD-530.81) 2)  Hyperlipidemia  (ICD-272.4) 3)  Hyperglycemia  (ICD-790.29) 4)  Visual Impairment  (ICD-368.10) 5)  Family History Diabetes 1st Degree Relative  (ICD-V18.0) 6)  Family History of Colon Ca 1st Degree Relative <60  (ICD-V16.0) 7)  Peptic Ulcer Disease  (ICD-533.90) 8)  Nephrolithiasis, Hx of  (ICD-V13.01)  Current Medications (verified): 1)  Prilosec Otc 20 Mg  Tbec (Omeprazole Magnesium) .... Once Daily Every Morning 2)  Simvastatin 40 Mg  Tabs (Simvastatin) .... Take 1 Tablet By Mouth At Bedtime 3)  Aspirin 81 Mg Tbec (Aspirin) .... Once Daily  Allergies (verified): No Known Drug Allergies  Past History:  Past Medical History: Last updated: 02/22/2008 Nephrolithiasis, hx of Peptic ulcer disease---led to transfusion Hyperlipidemia GERD  Past Surgical History: Last updated: 2007-12-13 blood transfusion from bleeding ulcer  Family History: Last updated: 12/13/07 Father: father  died colon ca age 77, DM II Mother: died complications appendicitis age 27 Siblings: 1 brother alive BCCE, 1 sisiter died age 19 lupus Family History of Colon CA 1st degree relative <60 Family History Diabetes 1st degree relative  Social History: Last updated: 13-Dec-2007 Retired Married Former Smoker Alcohol use-yes Regular exercise-no 3 children 1 son with OSA, obese, 1 son healthy, 1 daughter breast ca age 32  Risk Factors: Exercise: no (December 13, 2007)  Risk Factors: Smoking Status: quit (Dec 13, 2007)  Physical Exam  General:  Well-developed,well-nourished,in no acute distress; alert,appropriate and cooperative throughout examination Head:  normocephalic and atraumatic.   Eyes:  pupils equal and pupils round.   Ears:  R ear normal and L ear normal.   Neck:  No deformities, masses, or tenderness noted. Lungs:  normal respiratory effort and no intercostal retractions.   Heart:  regular rhythm and no murmur.   Abdomen:  soft and non-tender.   Genitalia:  scrotal skin tag---perineum Msk:  No deformity or scoliosis noted of thoracic or lumbar spine.   Pulses:  R radial normal and L radial normal.   Neurologic:  cranial nerves II-XII intact and gait normal.   Skin:  turgor normal  and color normal.   Psych:  normally interactive and good eye contact.     Impression & Recommendations:  Problem # 1:  HYPERLIPIDEMIA (ICD-272.4) well controlled continue current medications  His updated medication list for this problem includes:    Simvastatin 40 Mg Tabs (Simvastatin) .Marland Kitchen... Take 1/2  tablet by mouth at bedtime  Labs Reviewed: SGOT: 17 (02/28/2009)   SGPT: 20 (02/28/2009)   HDL:43.40 (02/28/2009), 45.4 (08/09/2008)  LDL:60 (02/28/2009), 63 (08/09/2008)  Chol:113 (02/28/2009), 120 (08/09/2008)  Trig:46.0 (02/28/2009), 57 (08/09/2008)  Problem # 2:  GERD (ICD-530.81) well controlled continue current medications  His updated medication list for this problem includes:    Prilosec  Otc 20 Mg Tbec (Omeprazole magnesium) ..... Once daily every morning  Problem # 3:  VISUAL IMPAIRMENT (ICD-368.10) reviewed record from 2009 (hospital record) He tells me a situation about insurance company and refusal to insure regarding hospitalization. pt given copy of dc summary and mri report  Complete Medication List: 1)  Prilosec Otc 20 Mg Tbec (Omeprazole magnesium) .... Once daily every morning 2)  Simvastatin 40 Mg Tabs (Simvastatin) .... Take 1/2  tablet by mouth at bedtime 3)  Aspirin 81 Mg Tbec (Aspirin) .... Once daily  Patient Instructions: 1)  Please schedule a follow-up appointment in 6 months.

## 2010-09-27 NOTE — Assessment & Plan Note (Signed)
Summary: ?uti/njr   Vital Signs:  Patient profile:   75 year old male Weight:      157 pounds Temp:     98.4 degrees F oral Pulse rate:   74 / minute Pulse rhythm:   regular Resp:     12 per minute BP sitting:   126 / 64  (left arm) Cuff size:   regular  Vitals Entered By: Gladis Riffle, RN (Jan 15, 2010 11:23 AM) CC: c/o episodes of low grade dysuria, interuption of stream, and flank pain--not so much at present Is Patient Diabetic? No Comments discuss UTI and kidney stone   Primary Care Provider:  Dr. Cato Mulligan  CC:  c/o episodes of low grade dysuria, interuption of stream, and and flank pain--not so much at present.  History of Present Illness: 3 month hx of intermittent dysuria and urgency no fever or chills no sweats minimal flank pain nocturia as frequently as q 3 hours.   Hx of kidney stones  Preventive Screening-Counseling & Management  Alcohol-Tobacco     Smoking Status: never     Year Quit: 1950  Current Medications (verified): 1)  Prilosec Otc 20 Mg  Tbec (Omeprazole Magnesium) .... Once Daily Every Morning 2)  Simvastatin 20 Mg Tabs (Simvastatin) .... Take One Tablet Every Other Evening 3)  Aspirin 81 Mg Tbec (Aspirin) .... Once Daily 4)  Nitrostat 0.4 Mg Subl (Nitroglycerin) .Marland Kitchen.. 1 Tablet Under Tongue At Onset of Chest Pain; You May Repeat Every 5 Minutes For Up To 3 Doses. 5)  Lisinopril-Hydrochlorothiazide 20-12.5 Mg Tabs (Lisinopril-Hydrochlorothiazide) .... One Tablet Daily  Allergies (verified): No Known Drug Allergies  Past History:  Past Medical History: Last updated: 11/10/2009 1. Nephrolithiasis, hx of 2. Peptic ulcer disease with upper GI bleed in 2009---led to transfusion 3. Hyperlipidemia 4. GERD 5. transient ischemic attack - 2009 6. hypertension  7. C-spine osteoarthritis 8. Chest pain: ETT-myoview (2/11) pt exercised 4', stopped due to fatigue but had bilateral shoulder pain, BP increased to 206/60.  1 mm ST depression in inferolateral  leads.  Nuclear images with no ischemia or infarction. Unable to do coronary CTA as unable to beta block HR below 70.  ETT-echo was then done, showing hypertenstive BP response and no evidence for ischemia or infarction.  Suspect shoulder pain was not ischemic.   Past Surgical History: Last updated: 11/17/2007 blood transfusion from bleeding ulcer  Family History: Last updated: 10-29-09 Father: father died colon ca age 84, DM II Mother: died complications appendicitis age 6 Siblings: 1 brother alive BCCE, 1 sisiter died age 1 lupus Family History of Colon CA 1st degree relative <60 Family History Diabetes 1st degree relative No premature CAD  Social History: Last updated: 11/17/2007 Retired Married Former Smoker Alcohol use-yes Regular exercise-no 3 children 1 son with OSA, obese, 1 son healthy, 1 daughter breast ca age 60  Risk Factors: Exercise: no (11/17/2007)  Risk Factors: Smoking Status: never (01/15/2010)  Physical Exam  General:  Well-developed,well-nourished,in no acute distress; alert,appropriate and cooperative throughout examination Head:  normocephalic and atraumatic.   Abdomen:  soft and non-tender.  no cva tenderness   Impression & Recommendations:  Problem # 1:  DYSURIA (ICD-788.1) i suspect more bph related check labs Orders: UA Dipstick w/o Micro (automated)  (81003) Venipuncture (16109) T-Culture, Urine ([id number]) TLB-PSA (Prostate Specific Antigen) (84153-PSA)  Problem # 2:  BENIGN PROSTATIC HYPERTROPHY, WITH OBSTRUCTION (ICD-600.01) sxs more consistent with BPH if labs normal will start tamsulosin His updated medication list for this problem includes:  Tamsulosin Hcl 0.4 Mg Caps (Tamsulosin hcl) .Marland Kitchen... Take 1 tab by mouth at bedtime  Complete Medication List: 1)  Prilosec Otc 20 Mg Tbec (Omeprazole magnesium) .... Once daily every morning 2)  Simvastatin 20 Mg Tabs (Simvastatin) .... Take one tablet every other evening 3)  Aspirin  81 Mg Tbec (Aspirin) .... Once daily 4)  Nitrostat 0.4 Mg Subl (Nitroglycerin) .Marland Kitchen.. 1 tablet under tongue at onset of chest pain; you may repeat every 5 minutes for up to 3 doses. 5)  Lisinopril-hydrochlorothiazide 20-12.5 Mg Tabs (Lisinopril-hydrochlorothiazide) .... One tablet daily 6)  Tamsulosin Hcl 0.4 Mg Caps (Tamsulosin hcl) .... Take 1 tab by mouth at bedtime  Patient Instructions: 1)  Please schedule a follow-up appointment in 1 month. Prescriptions: TAMSULOSIN HCL 0.4 MG CAPS (TAMSULOSIN HCL) Take 1 tab by mouth at bedtime  #30 x 3   Entered and Authorized by:   Birdie Sons MD   Signed by:   Birdie Sons MD on 01/15/2010   Method used:   Faxed to ...       Hospital doctor (retail)       [address]       [place], [place]  [address]       Ph: [phone] or [phone]       Fax: [phone]   RxID:   [id number]   Laboratory Results   Urine Tests    Routine Urinalysis   Color: yellow Appearance: Clear Glucose: negative   (Normal Range: Negative) Bilirubin: negative   (Normal Range: Negative) Ketone: negative   (Normal Range: Negative) Spec. Gravity: 1.020   (Normal Range: 1.003-1.035) Blood: negative   (Normal Range: Negative) pH: 7.0   (Normal Range: 5.0-8.0) Protein: negative   (Normal Range: Negative) Urobilinogen: 0.2   (Normal Range: 0-1) Nitrite: negative   (Normal Range: Negative) Leukocyte Esterace: negative   (Normal Range: Negative)    Comments: Rita Ohara  Jan 15, 2010 3:14 PM

## 2010-09-27 NOTE — Progress Notes (Signed)
Summary: B/P readings  Phone Note Outgoing Call   Call placed by: Katina Dung, RN, BSN,  November 23, 2009 1:49 PM Call placed to: Patient Summary of Call: B/P readings  Follow-up for Phone Call        [facility] for pt to call me to get B/P readings from pt --Lisinopril 20/12.5mg  started 11-10-09 Katina Dung, RN, BSN  November 23, 2009 1:59 PM   returning call, please call back @ [phone] or [phone], Migdalia Dk  November 24, 2009 10:51 AM   Additional Follow-up for Phone Call Additional follow up Details #1::        spoke with pt, he reports the following bp readings     BEFORE MEDS                 AFTER MEDS 3/18-126/63  101               102/57     94 3/19-145/62  96                 107/54     90 3/20-131/63  82                 111/51     80 3/21-136/60  86                 120/55     83 3/22-148/62  99                 125/60     92 3/23-121/54  89 3/24-134/57  89                 119/59     82 3/25-130/59  88                 128/59     80 3/26-119/59  86                 124/64     72 3/27-141/72  94                 119/62     82 3/28-118/54  81 3/29-121/60  92 3/30-118/51  81                 112/55     86 3/31-120/57  93                 120/58     86 pt states he is having no problems. he questioned when his follow up will be. will foward to dr Shirlee Latch for his review. Deliah Goody, RN  November 24, 2009 11:39 AM\par     Appended Document: B/P readings labs look good. followup was to be in september  Appended Document: B/P readings pt aware

## 2010-09-27 NOTE — Progress Notes (Signed)
Summary: BP F/U  [facility])  Phone Note Outgoing Call   Call placed by: Sherri Rad, RN, BSN,  November 01, 2009 10:07 AM Call placed to: Patient Summary of Call: I attempted to call the pt at his home # and cell #- I left a message at both numbers to call back regarding BP f/u. Sherri Rad, RN, BSN  November 01, 2009 10:08 AM   Follow-up for Phone Call        returning cal,please call back @ [phone], Migdalia Dk  November 01, 2009 11:30 AM   Additional Follow-up for Phone Call Additional follow up Details #1::        Called patient back and he gave me a list of BP readingsof the following:3/1 830 am151/72 hr77,1015 pm 138/67 hr67,3/2 10am139/69 hr74, 3/3 11am 155/70 hr88, 315 pm 151/75 hr83, 8 pm 141/64 hr72,3/4 11am 144/64 hr87 430pm 143/64 hr83 10pm 142/76 hr72, 3/5 845 am 151/75 hr72 1230pm 130/64 hr72,11pm 123/63 hr73 ,3/6 945 am 143/71 hr79  630pm 143/74 hr87, 3/7 830am 124/63 hr84 8pm 126/64 hr82, 3/8 815am 135/69 hr79 815pm 124/64 hr78. Basically, he started Lisinopril/hctz 20/12.5mg   1 tablet every day on 10/25/2009. Scheduled for stress echo and labs on 3/11. Will foward BP and HR readings to Dr.Nichols Corter. Additional Follow-up by: J REISS RN     Appended Document: BP F/U  stress echo 11-03-09//appt Dr Shirlee Latch 11-10-09 BP seemed to improve as time passed after starting lisinopril.  Will see how pressure is doing at followup.   Appended Document: BP F/U  [facility]) appt with Dr Shirlee Latch 11-10-09

## 2010-09-27 NOTE — Assessment & Plan Note (Signed)
Summary: 4 month rov/njr   Vital Signs:  Patient profile:   75 year old male Weight:      148 pounds Temp:     98 degrees F oral Pulse rate:   78 / minute Pulse rhythm:   regular BP sitting:   114 / 56  Vitals Entered By: Lynann Beaver CMA AAMA (June 22, 2010 9:10 AM) CC: rov Is Patient Diabetic? No Pain Assessment Patient in pain? no        Primary Care Provider:  Dr. Cato Mulligan  CC:  rov.  History of Present Illness:  Follow-Up Visit      This is a 75 year old man who presents for Follow-up visit.  The patient denies chest pain and palpitations.  Since the last visit the patient notes no new problems or concerns.  The patient reports taking meds as prescribed.  When questioned about possible medication side effects, the patient notes none.  Says he is feeling well.  All other systems reviewed and were negative   Current Medications (verified): 1)  Prilosec Otc 20 Mg  Tbec (Omeprazole Magnesium) .... Once Daily Every Morning 2)  Aspirin 81 Mg Tbec (Aspirin) .... Once Daily 3)  Nitrostat 0.4 Mg Subl (Nitroglycerin) .Marland Kitchen.. 1 Tablet Under Tongue At Onset of Chest Pain; You May Repeat Every 5 Minutes For Up To 3 Doses. 4)  Lisinopril-Hydrochlorothiazide 20-12.5 Mg Tabs (Lisinopril-Hydrochlorothiazide) .... One Tablet Daily 5)  Tamsulosin Hcl 0.4 Mg Caps (Tamsulosin Hcl) .... Take 1 Tab By Mouth At Bedtime  Allergies (verified): No Known Drug Allergies  Physical Exam  General:  well-developed well-nourished male in no acute distress. He is thin. HEENT exam atraumatic, normocephalic. Neck is supple. Chest clear to auscultation. Cardiac exam S1-S2 are regular. Abdominal exam active bowel sounds, soft, nontender. Extremities there is no clubbing cyanosis or edema. Neurologic exam is alert gait is normal.   Impression & Recommendations:  Problem # 1:  BENIGN PROSTATIC HYPERTROPHY, WITH OBSTRUCTION (ICD-600.01) sxs resolved His updated medication list for this problem  includes:    Tamsulosin Hcl 0.4 Mg Caps (Tamsulosin hcl) .Marland Kitchen... Take 1 tab by mouth at bedtime  Problem # 2:  HYPERLIPIDEMIA (ICD-272.4)  he self d/cd simvastatin he thinks simvastain caused a lot of GI issues--- note weight loss over past year he blames on simvastatin stopped simvastatin and GI sxs resolved---weight loss stabilized  Orders: TLB-Lipid Panel (80061-LIPID) TLB-Hepatic/Liver Function Pnl (80076-HEPATIC) TLB-TSH (Thyroid Stimulating Hormone) (84443-TSH)  Problem # 3:  GERD (ICD-530.81) controlled continue current medications  His updated medication list for this problem includes:    Prilosec Otc 20 Mg Tbec (Omeprazole magnesium) ..... Once daily every morning  Problem # 4:  HYPERTENSION, UNSPECIFIED (ICD-401.9)  controlled continue current medications  His updated medication list for this problem includes:    Lisinopril-hydrochlorothiazide 20-12.5 Mg Tabs (Lisinopril-hydrochlorothiazide) ..... One tablet daily  BP today: 114/56 Prior BP: 132/74 (05/04/2010)  Labs Reviewed: K+: 4.0 (11/03/2009) Creat: : 1.2 (11/03/2009)   Chol: 116 (09/07/2009)   HDL: 41.70 (09/07/2009)   LDL: 61 (09/07/2009)   TG: 67.0 (09/07/2009)  Orders: Venipuncture ([address]) TLB-BMP (Basic Metabolic Panel-BMET) (80048-METABOL)  Complete Medication List: 1)  Prilosec Otc 20 Mg Tbec (Omeprazole magnesium) .... Once daily every morning 2)  Aspirin 81 Mg Tbec (Aspirin) .... Once daily 3)  Nitrostat 0.4 Mg Subl (Nitroglycerin) .Marland Kitchen.. 1 tablet under tongue at onset of chest pain; you may repeat every 5 minutes for up to 3 doses. 4)  Lisinopril-hydrochlorothiazide 20-12.5 Mg Tabs (Lisinopril-hydrochlorothiazide) .Marland KitchenMarland KitchenMarland Kitchen  One tablet daily 5)  Tamsulosin Hcl 0.4 Mg Caps (Tamsulosin hcl) .... Take 1 tab by mouth at bedtime  Other Orders: TLB-A1C / Hgb A1C (Glycohemoglobin) (83036-A1C)   Orders Added: 1)  Est. Patient Level IV [04540] 2)  Venipuncture [98119] 3)  TLB-BMP (Basic Metabolic  Panel-BMET) [80048-METABOL] 4)  TLB-Lipid Panel [80061-LIPID] 5)  TLB-Hepatic/Liver Function Pnl [80076-HEPATIC] 6)  TLB-TSH (Thyroid Stimulating Hormone) [84443-TSH] 7)  TLB-A1C / Hgb A1C (Glycohemoglobin) [83036-A1C]  Appended Document: Orders Update    Clinical Lists Changes  Orders: Added new Service order of Specimen Handling (14782) - Signed

## 2010-09-27 NOTE — Progress Notes (Signed)
Summary: stress echo  Phone Note Outgoing Call   Call placed by: Katina Dung, RN, BSN,  October 24, 2009 5:53 PM Call placed to: Patient Summary of Call: pt unable to have CTA today  Follow-up for Phone Call        pt unable to have coronary CTA today--Dr Shirlee Latch recommended pt start Lisinopril 20/12.5mg  daily--take and record B/P--I will ask Mess RN call pt 11-01-09 to get B/P readings prior to stress echo 11-03-09-pt to also have BMP 11-03-09 and will see Dr Shirlee Latch 11-10-09    New/Updated Medications: LISINOPRIL-HYDROCHLOROTHIAZIDE 20-12.5 MG TABS (LISINOPRIL-HYDROCHLOROTHIAZIDE) one tablet daily Prescriptions: LISINOPRIL-HYDROCHLOROTHIAZIDE 20-12.5 MG TABS (LISINOPRIL-HYDROCHLOROTHIAZIDE) one tablet daily  #30 x 3   Entered and Authorized by:   Katina Dung, RN, BSN   Signed by:   Katina Dung, RN, BSN on 10/24/2009   Method used:   Print then Give to Patient   RxID:   [id number]    Current Medications (verified): 1)  Prilosec Otc 20 Mg  Tbec (Omeprazole Magnesium) .... Once Daily Every Morning 2)  Simvastatin 20 Mg Tabs (Simvastatin) .... Take One Tablet Every Other Evening 3)  Aspirin 81 Mg Tbec (Aspirin) .... Once Daily 4)  Nitrostat 0.4 Mg Subl (Nitroglycerin) .Marland Kitchen.. 1 Tablet Under Tongue At Onset of Chest Pain; You May Repeat Every 5 Minutes For Up To 3 Doses. 5)  Lisinopril-Hydrochlorothiazide 20-12.5 Mg Tabs (Lisinopril-Hydrochlorothiazide) .... One Tablet Daily  Allergies: No Known Drug Allergies

## 2010-09-27 NOTE — Progress Notes (Signed)
Summary: NEED REFILLS  Phone Note From Pharmacy Call back at [phone]   Caller: [facility] Summary of Call: PHARMACY NEED REFILLS FOR PT Initial call taken by: Judie Grieve,  October 25, 2009 4:58 PM  Follow-up for Phone Call        Called and spoke with pharmacist.  Pharmacist reports the patient already contacted Dr. Shirlee Latch on his cell phone to get authorization for his medications.  Rx filled and picked up.  No follow up needed.   Follow-up by: Judithe Modest CMA,  October 26, 2009 11:17 AM

## 2010-09-27 NOTE — Progress Notes (Signed)
  Phone Note Call from Patient Call back at Home Phone [phone]   Caller: Patient Summary of Call: Pt calling to inform that he has been having an issue with Medicare denying some of his claims because of invalid information Medicare had in their system.  Was advised by Medicare to have provider to resubmit claims after May 8th, if they were previously denied. Initial call taken by: Trixie Dredge,  December 21, 2007 10:56 AM  Follow-up for Phone Call        Advsd pt I would review his account and if any of his claims with Dr. Cato Mulligan had been denied I would notify profee and advise them to resubmit these claims to [facility] on his behalf, after May 8th, as stated. Follow-up by: Trixie Dredge,  December 21, 2007 11:03 AM  Additional Follow-up for Phone Call Additional follow up Details #1::        Reviewed all dos filed with Medicare and they are all still pending,  insurance.  No need to refile any claims at this time. Additional Follow-up by: Trixie Dredge,  Jan 04, 2008 8:21 AM

## 2010-09-27 NOTE — Assessment & Plan Note (Signed)
Summary: np6/chest pain/abn ekg   Primary Provider:  Dr. Cato Mulligan  CC:  new patient.  Chest pain.  Follow up from Stress test.  Pt has an elevated BP today.  He states that it is always elevated at the Drs office and that it is usually in the 130 to 140 range at home for the systolic.  He questions the BP taken in January.  History of Present Illness: 75 yo with history of hyperlipidemia and GERD presents for evaluation of chest pain.  For the last couple of months, [name] has had pain in both shoulders after walking 1/4 to 1/3 of a mile.  If he keeps walking, the pain will actually decrease.  It is also associated with mild shortness of breath.  No pain in his chest.  He does not tend to note symptoms with other activities.  He has fallen on both shoulders before and he has some arthritis at baseline, but this has felt different than past arthritis.  More recently, the symptoms seem to have dissipated somewhat.   Patient had an ETT-myoview done this month.  He exercised 4 minutes and stopped it appears mainly due to hypertensive BP response. He says he was tired but could have kept going a bit.  They stopped him due to high BP.  He did have some pain in his shoulders bilaterally with walking on the treadmill.  There was 1 mm ST depression in the inferolateral leads but the perfusion images were normal with no evidence of ischemia or infarction.   BP today is high at 160/90.   ECG: NSR, left atrial enlargement  Labs (1/11): LDL 61, HDL 42, creatinine 1.0  Current Medications (verified): 1)  Prilosec Otc 20 Mg  Tbec (Omeprazole Magnesium) .... Once Daily Every Morning 2)  Simvastatin 20 Mg Tabs (Simvastatin) .... Take One Tablet Every Other Evening 3)  Aspirin 81 Mg Tbec (Aspirin) .... Once Daily 4)  Nitrostat 0.4 Mg Subl (Nitroglycerin) .Marland Kitchen.. 1 Tablet Under Tongue At Onset of Chest Pain; You May Repeat Every 5 Minutes For Up To 3 Doses. 5)  Metoprolol Tartrate 50 Mg Tabs (Metoprolol Tartrate)  .... Take One Tablet By Mouth The Evening of February 28 and The Morning of March 1  Allergies (verified): No Known Drug Allergies  Past History:  Past Medical History: 1. Nephrolithiasis, hx of 2. Peptic ulcer disease with upper GI bleed in 2009---led to transfusion 3. Hyperlipidemia 4. GERD 5. transient ischemic attack - 2009 6. hypertension  7. C-spine osteoarthritis 8. Chest pain: ETT-myoview (2/11) pt exercised 4', stopped due to fatigue but had bilateral shoulder pain, BP increased to 206/60.  1 mm ST depression in inferolateral leads.  Nuclear images with no ischemia or infarction.   Family History: Father: father died colon ca age 95, DM II Mother: died complications appendicitis age 15 Siblings: 1 brother alive BCCE, 1 sisiter died age 76 lupus Family History of Colon CA 1st degree relative <60 Family History Diabetes 1st degree relative No premature CAD  Social History: Reviewed history from 11/17/2007 and no changes required. Retired Married Former Smoker Alcohol use-yes Regular exercise-no 3 children 1 son with OSA, obese, 1 son healthy, 1 daughter breast ca age 24  Review of Systems       All systems reviewed and negative except as per HPI.   Vital Signs:  Patient profile:   75 year old male Height:      67 inches Weight:      166 pounds BMI:  26.09 Pulse rate:   87 / minute Pulse rhythm:   regular BP sitting:   160 / 90  (left arm) Cuff size:   regular  Vitals Entered By: Judithe Modest CMA (October 17, 2009 11:52 AM)  Physical Exam  General:  Well developed, well nourished, in no acute distress. Head:  normocephalic and atraumatic Nose:  no deformity, discharge, inflammation, or lesions Mouth:  Teeth, gums and palate normal. Oral mucosa normal. Neck:  Neck supple, no JVD. No masses, thyromegaly or abnormal cervical nodes. Lungs:  Clear bilaterally to auscultation and percussion. Heart:  Non-displaced PMI, chest non-tender; regular rate and  rhythm, S1, S2 without rubs or gallops. There was a 1/6 systolic murmur at the RUSB.  Carotid upstroke normal, no bruit.  Pedals normal pulses. No edema, no varicosities. Abdomen:  Bowel sounds positive; abdomen soft and non-tender without masses, organomegaly, or hernias noted. No hepatosplenomegaly. Msk:  Back normal, normal gait. Muscle strength and tone normal. Extremities:  No clubbing or cyanosis. Neurologic:  Alert and oriented x 3. Skin:  Intact without lesions or rashes. Psych:  Normal affect.   Impression & Recommendations:  Problem # 1:  CHEST PAIN (ICD-786.50) Patient gets bilateral shoulder discomfort after walking 1/4-1/3.  He had the same symptoms on the treadmill.  The pain actually seems to improve when he continues to exert himself.  It is associated with shortness of breath.  His myoview showed no infarction or ischemia by perfusion images but the ECG was positive with 1 mm ST depression in several leads in association with shoulder pain and poor exercise tolerance (only 4', but may have been stopped primarily due to high BP).  He has hyperlipidemia but the lipids are well-controlled.  He does not have a definite HTN diagnosis but BP is elevated.  Given his exertional symptoms and somewhat equivocal stress test, we discussed further evaluation.  We talked about both catheterization for definitive diagnosis and further noninvasive evaluation with CT coronary angiography.  [name] opted for a coronary CTA, so I will arrange for this to be done soon. He should continue ASA, and I will give him a prescription for NTG sublingual.    Problem # 2:  ELEVATED BLOOD PRESSURE Patient is not on medication for HTN and review of past appointments with Dr. Cato Mulligan shows that his BP has tended to be within normal range.  It is elevated today, and he had a hypertensive BP response on the treadmill.  He has a home cuff and will check BP daily.  I will go over his readings, and if they tend to be  elevated, will start a BP medication.    Problem # 3:  HYPERLIPIDEMIA (ICD-272.4) Well-controlled when checked in 1/11.   Other Orders: Cardiac CTA (Cardiac CTA) TLB-BMP (Basic Metabolic Panel-BMET) (80048-METABOL)  Patient Instructions: 1)  Your physician has requested that you have a cardiac CT.  Cardiac computed tomography (CT) is a painless test that uses an x-ray machine to take clear, detailed pictures of your heart.  For further information please visit [email or web address]  Please follow instruction sheet as given. 2)  Take Metoprolol 50mg  Monday night February 28 and Tuesday morning March 1 prior to the cardiac CTA March 1 at 1pm. 3)  Your physician has recommended you make the following change in your medication: use Nitroglycerin as needed for chest pain-- 4)  Your physician recommended you take 1 tablet under tongue at onset of chest pain; you may repeat every 5 minutes for up to  3 doses. If 3 or more doses are required, call 911 and proceed to the ER immediately. 5)  Your physician recommends that you have lab work today BMP---786.50 401.9 6)  Your physician recommends that you schedule a follow-up appointment in: 2 weeks with Dr Shirlee Latch Prescriptions: METOPROLOL TARTRATE 50 MG TABS (METOPROLOL TARTRATE) Take one tablet by mouth the evening of February 28 and the morning of March 1  #2 x 0   Entered by:   Katina Dung, RN, BSN   Authorized by:   Marca Ancona, MD   Signed by:   Katina Dung, RN, BSN on 10/17/2009   Method used:   Faxed to ...       [facility]       [address]       [place], [place]  [address]       Ph: [phone] or [phone]       Fax: [phone]   RxID:   [id number] NITROSTAT 0.4 MG SUBL (NITROGLYCERIN) 1 tablet under tongue at onset of chest pain; you may repeat every 5 minutes for up to 3 doses.  #100 x 3   Entered by:   Katina Dung, RN, BSN   Authorized by:   Marca Ancona, MD   Signed  by:   Katina Dung, RN, BSN on 10/17/2009   Method used:   Faxed to ...       Hospital doctor (retail)       [address]       [place], [place]  [address]       Ph: [phone] or [phone]       Fax: [phone]   RxID:   [id number]

## 2010-09-27 NOTE — Letter (Signed)
Summary: [facility]  [facility]   Imported By: Maryln Gottron 04/25/2010 15:43:17  _____________________________________________________________________  External Attachment:    Type:   Image     Comment:   External Document

## 2010-09-27 NOTE — Assessment & Plan Note (Signed)
Summary: 6 mo rov/mm---PT RSC (BMP) // RS   Vital Signs:  Patient profile:   75 year old male Weight:      169 pounds Temp:     97.6 degrees F Pulse rate:   72 / minute BP sitting:   120 / 66  (left arm)  Vitals Entered By: Gladis Riffle, RN (September 14, 2009 10:08 AM)   History of Present Illness:  Follow-Up Visit      This is a 75 year old man who presents for Follow-up visit.  The patientadmits to  chest pain,  but denies palpitations, dizziness, syncope, DOE, PND, and orthopnea.  Since the last visit the patient notes no new problems or concerns---see below.  The patient reports taking meds as prescribed.  When questioned about possible medication side effects, the patient notes none.    he describes bilateral shoulder pain with exertion---resolves with continued exertion  All other systems reviewed and were negative   Preventive Screening-Counseling & Management  Alcohol-Tobacco     Smoking Status: quit     Year Quit: 1950  Allergies (verified): No Known Drug Allergies  Comments:  Nurse/Medical Assistant: 6 month rov, labs done  The patient's medications and allergies were reviewed with the patient and were updated in the Medication and Allergy Lists. Gladis Riffle, RN (September 14, 2009 10:09 AM)  Flu Vaccine Consent Questions     Do you have a history of severe allergic reactions to this vaccine? no    Any prior history of allergic reactions to egg and/or gelatin? no    Do you have a sensitivity to the preservative Thimersol? no    Do you have a past history of Guillan-Barre Syndrome? no    Do you currently have an acute febrile illness? no    Have you ever had a severe reaction to latex? no    Vaccine information given and explained to patient? yes    Are you currently pregnant? no    Lot Number:[id number]   Exp Date:02/22/2010   Site Given  Left Deltoid IM   Past History:  Past Medical History: Last updated: 02/22/2008 Nephrolithiasis, hx of Peptic ulcer  disease---led to transfusion Hyperlipidemia GERD  Past Surgical History: Last updated: 2007/11/18 blood transfusion from bleeding ulcer  Family History: Last updated: November 18, 2007 Father: father died colon ca age 53, DM II Mother: died complications appendicitis age 19 Siblings: 1 brother alive BCCE, 1 sisiter died age 10 lupus Family History of Colon CA 1st degree relative <60 Family History Diabetes 1st degree relative  Social History: Last updated: 11/18/2007 Retired Married Former Smoker Alcohol use-yes Regular exercise-no 3 children 1 son with OSA, obese, 1 son healthy, 1 daughter breast ca age 63  Risk Factors: Exercise: no (11/18/07)  Risk Factors: Smoking Status: quit (09/14/2009)  Review of Systems       All other systems reviewed and were negative   Physical Exam  General:  Well-developed,well-nourished,in no acute distress; alert,appropriate and cooperative throughout examination Head:  normocephalic and atraumatic.   Eyes:  pupils equal and pupils round.   Ears:  R ear normal and L ear normal.   Nose:  no external deformity and no external erythema.   Neck:  No deformities, masses, or tenderness noted. Lungs:  normal respiratory effort and no intercostal retractions.   Heart:  regular rhythm and no murmur.   Abdomen:  soft and non-tender.   Msk:  No deformity or scoliosis noted of thoracic or lumbar spine.   Pulses:  R radial normal and L radial normal.   Neurologic:  cranial nerves II-XII intact and gait normal.   Skin:  turgor normal and color normal.   Psych:  memory intact for recent and remote and good eye contact.     Impression & Recommendations:  Problem # 1:  HYPERLIPIDEMIA (ICD-272.4) controlled continue current medications  His updated medication list for this problem includes:    Simvastatin 20 Mg Tabs (Simvastatin) .Marland Kitchen... 1 by mouth at bedtime  Labs Reviewed: SGOT: 23 (09/07/2009)   SGPT: 29 (09/07/2009)   HDL:41.70 (09/07/2009),  43.40 (02/28/2009)  LDL:61 (09/07/2009), 60 (02/28/2009)  Chol:116 (09/07/2009), 113 (02/28/2009)  Trig:67.0 (09/07/2009), 46.0 (02/28/2009)  Problem # 2:  HYPERGLYCEMIA (ICD-790.29) not a current issue reviewed A1C Labs Reviewed: Creat: 1.0 (09/07/2009)     Problem # 3:  GERD (ICD-530.81) suspcet the cause of chest pain side effects discussed His updated medication list for this problem includes:    Prilosec Otc 20 Mg Tbec (Omeprazole magnesium) ..... Once daily every morning  Problem # 4:  CHEST PAIN (ICD-786.50)  I doubt this is of significance but has risk factors---needs further evaluation  Orders: Cardiolite (Cardiolite)  Complete Medication List: 1)  Prilosec Otc 20 Mg Tbec (Omeprazole magnesium) .... Once daily every morning 2)  Simvastatin 20 Mg Tabs (Simvastatin) .Marland Kitchen.. 1 by mouth at bedtime 3)  Aspirin 81 Mg Tbec (Aspirin) .... Once daily  Other Orders: Flu Vaccine 30yrs + (40347) Administration Flu vaccine - MCR (G0008) TD Toxoids IM 7 YR + (42595) Admin 1st Vaccine (63875) Admin of Any Addtl Vaccine (64332) Prescriptions: PRILOSEC OTC 20 MG  TBEC (OMEPRAZOLE MAGNESIUM) once daily every morning  #90 x 3   Entered and Authorized by:   Birdie Sons MD   Signed by:   Birdie Sons MD on 09/14/2009   Method used:   Print then Give to Patient   RxID:   [id number] SIMVASTATIN 20 MG TABS (SIMVASTATIN) 1 by mouth at bedtime  #90 x 3   Entered and Authorized by:   Birdie Sons MD   Signed by:   Birdie Sons MD on 09/14/2009   Method used:   Print then Give to Patient   RxID:   [id number]    Immunizations Administered:  Tetanus Vaccine:    Vaccine Type: Td    Site: right deltoid    Mfr: Sanofi Pasteur    Dose: 0.5 ml    Route: IM    Given by: Gladis Riffle, RN    Exp. Date: 11/29/2010    Lot #: [id number]

## 2010-09-27 NOTE — Progress Notes (Signed)
Summary: Nuclear Pre-Procedure  Phone Note Outgoing Call Call back at [facility] Phone [phone]   Call placed by: Stanton Kidney, EMT-P,  September 18, 2009 3:28 PM Call placed to: Patient Action Taken: Phone Call Completed Summary of Call: Reviewed information on Myoview Information Sheet (see scanned document for further details).  Spoke with Patient.    Nuclear Med Background Indications for Stress Test: Evaluation for Ischemia   History: CT/MRI, Echo  History Comments: 11/04/07 Echo: EF= 70-75% 11/04/07 CT: (-)  Symptoms: Chest Pain, Chest Pain with Exertion    Nuclear Pre-Procedure Cardiac Risk Factors: History of Smoking, Lipids

## 2010-09-27 NOTE — Progress Notes (Signed)
Summary: b/p issues  with new meds LISINOPRIL-HYDROCHLOROTHIAZIDE   Phone Note Call from Patient Call back at Home Phone [phone]   Caller: Patient Reason for Call: Refill Medication, Talk to Nurse Details for Reason: Per pt called, just started new med LISINOPRIL-HYDROCHLOROTHIAZIDE 20-12.5 MG TABS one tablet daily.c/o115/19 pulse 100. pt haven't taken his med.  Initial call taken by: Lorne Skeens,  November 08, 2009 11:42 AM  Follow-up for Phone Call        BP usually 140s before taking Lisinopril/hctz, today at 10am it was 115/59 wanted to make sure it was ok to take med, reassured pt it was ok, he has a f/u appt w/Dr Shirlee Latch on Fri 3/18 and will bring his BP readings for review Meredith Staggers, RN  November 08, 2009 11:59 AM

## 2010-09-27 NOTE — Progress Notes (Signed)
Summary: Phone Call  Phone Note Call from Patient   Caller: Patient @ [phone] Reason for Call: Talk to Nurse, Talk to Doctor Summary of Call: Pt has OV scheduled for Jan. 20, 2011 @ 9:45am with Dr Cato Mulligan...Marland KitchenMarland Kitchen Pt is wanting to know if he will have labs done prior to appt date/time... Pt was adv that there are no orders in EMR listed from last OV, only to schedule a 6 mth f/u appt.... Pt adv that he normally has bldwrk/labs done prior to his appt.... Can you advise?  Initial call taken by: Debbra Riding,  August 24, 2009 11:41 AM  Follow-up for Phone Call        labs one week prior to visit lipids---272.4 lfts-995.2 bmet-995.2 A1C-250.02    Follow-up by: Birdie Sons MD,  August 24, 2009 2:10 PM  Additional Follow-up for Phone Call Additional follow up Details #1::        Phone Call Completed--------Contacted pt at [phone].... Adv pt of Dr Cato Mulligan orders.... Pt made appt at [facility] for same on Jan. 12, 2011 @ 8:40am.  Additional Follow-up by: Debbra Riding,  August 24, 2009 3:33 PM

## 2010-09-27 NOTE — Letter (Signed)
Summary: [facility]  [facility]   Imported By: Maryln Gottron 05/08/2010 15:07:42  _____________________________________________________________________  External Attachment:    Type:   Image     Comment:   External Document

## 2010-09-27 NOTE — Assessment & Plan Note (Signed)
Summary: 1 month rov/njr   Vital Signs:  Patient profile:   75 year old male Weight:      156 pounds BMI:     24.52 Temp:     97.8 degrees F oral Pulse rate:   70 / minute Pulse rhythm:   regular Resp:     12 per minute BP sitting:   108 / 68  (left arm) Cuff size:   regular  Vitals Entered By: Gladis Riffle, RN (February 16, 2010 11:42 AM) CC: 1 month rov Is Patient Diabetic? No Comments does not check BP at home   Primary Care Provider:  Dr. Cato Mulligan  CC:  1 month rov.  History of Present Illness:  Follow-Up Visit      This is a 75 year old man who presents for Follow-up visit.  The patient denies chest pain, palpitations, and syncope.  Since the last visit the patient notes no new problems or concerns.  The patient reports taking meds as prescribed.  When questioned about possible medication side effects, the patient notes none.   BPH sxs are much better.   All other systems reviewed and were negative   Preventive Screening-Counseling & Management  Alcohol-Tobacco     Smoking Status: never     Year Quit: 1950  Current Medications (verified): 1)  Prilosec Otc 20 Mg  Tbec (Omeprazole Magnesium) .... Once Daily Every Morning 2)  Simvastatin 20 Mg Tabs (Simvastatin) .... Take One Tablet Every Other Evening 3)  Aspirin 81 Mg Tbec (Aspirin) .... Once Daily 4)  Nitrostat 0.4 Mg Subl (Nitroglycerin) .Marland Kitchen.. 1 Tablet Under Tongue At Onset of Chest Pain; You May Repeat Every 5 Minutes For Up To 3 Doses. 5)  Lisinopril-Hydrochlorothiazide 20-12.5 Mg Tabs (Lisinopril-Hydrochlorothiazide) .... One Tablet Daily 6)  Tamsulosin Hcl 0.4 Mg Caps (Tamsulosin Hcl) .... Take 1 Tab By Mouth At Bedtime  Allergies (verified): No Known Drug Allergies  Past History:  Past Medical History: Last updated: 11/10/2009 1. Nephrolithiasis, hx of 2. Peptic ulcer disease with upper GI bleed in 2009---led to transfusion 3. Hyperlipidemia 4. GERD 5. transient ischemic attack - 2009 6. hypertension  7.  C-spine osteoarthritis 8. Chest pain: ETT-myoview (2/11) pt exercised 4', stopped due to fatigue but had bilateral shoulder pain, BP increased to 206/60.  1 mm ST depression in inferolateral leads.  Nuclear images with no ischemia or infarction. Unable to do coronary CTA as unable to beta block HR below 70.  ETT-echo was then done, showing hypertenstive BP response and no evidence for ischemia or infarction.  Suspect shoulder pain was not ischemic.   Past Surgical History: Last updated: 11/17/2007 blood transfusion from bleeding ulcer  Family History: Last updated: Nov 02, 2009 Father: father died colon ca age 5, DM II Mother: died complications appendicitis age 65 Siblings: 1 brother alive BCCE, 1 sisiter died age 66 lupus Family History of Colon CA 1st degree relative <60 Family History Diabetes 1st degree relative No premature CAD  Social History: Last updated: 11/17/2007 Retired Married Former Smoker Alcohol use-yes Regular exercise-no 3 children 1 son with OSA, obese, 1 son healthy, 1 daughter breast ca age 64  Risk Factors: Exercise: no (11/17/2007)  Risk Factors: Smoking Status: never (02/16/2010)  Physical Exam  General:  Well-developed,well-nourished,in no acute distress; alert,appropriate and cooperative throughout examination Head:  normocephalic and atraumatic.   Eyes:  pupils equal and pupils round.   Ears:  R ear normal and L ear normal.   Neck:  No deformities, masses, or tenderness noted. Lungs:  normal respiratory effort and no intercostal retractions.   Heart:  normal rate and regular rhythm.   Abdomen:  soft and non-tender.  no cva tenderness   Impression & Recommendations:  Problem # 1:  BENIGN PROSTATIC HYPERTROPHY, WITH OBSTRUCTION (ICD-600.01) says he is at least 75 % better has had a few nights without any nocturia His updated medication list for this problem includes:    Tamsulosin Hcl 0.4 Mg Caps (Tamsulosin hcl) .Marland Kitchen... Take 1 tab by mouth at  bedtime  Problem # 2:  HYPERTENSION, UNSPECIFIED (ICD-401.9) controlled continue current medications  His updated medication list for this problem includes:    Lisinopril-hydrochlorothiazide 20-12.5 Mg Tabs (Lisinopril-hydrochlorothiazide) ..... One tablet daily  BP today: 108/68 Prior BP: 126/64 (01/15/2010)  Labs Reviewed: K+: 4.0 (11/03/2009) Creat: : 1.2 (11/03/2009)   Chol: 116 (09/07/2009)   HDL: 41.70 (09/07/2009)   LDL: 61 (09/07/2009)   TG: 67.0 (09/07/2009)  Complete Medication List: 1)  Prilosec Otc 20 Mg Tbec (Omeprazole magnesium) .... Once daily every morning 2)  Simvastatin 20 Mg Tabs (Simvastatin) .... Take one tablet every other evening 3)  Aspirin 81 Mg Tbec (Aspirin) .... Once daily 4)  Nitrostat 0.4 Mg Subl (Nitroglycerin) .Marland Kitchen.. 1 tablet under tongue at onset of chest pain; you may repeat every 5 minutes for up to 3 doses. 5)  Lisinopril-hydrochlorothiazide 20-12.5 Mg Tabs (Lisinopril-hydrochlorothiazide) .... One tablet daily 6)  Tamsulosin Hcl 0.4 Mg Caps (Tamsulosin hcl) .... Take 1 tab by mouth at bedtime  Patient Instructions: 1)  Please schedule a follow-up appointment in 4 months.

## 2010-09-27 NOTE — Assessment & Plan Note (Signed)
Summary: f4m   Primary Provider:  Dr. Cato Mulligan   History of Present Illness: 75 yo with history of hyperlipidemia and GERD returns for cardiology followup.  Since last appointment he has had some trouble with GI discomfort: nausea, vomiting, and epigastric pain.  He had a CT of his abdomen, which he tells me showed only some abdominal arterial atherosclerosis. No further chest pain. He did stop his simvastation as he thought that it was causing his GI symptoms.  He does feel better after stopping it. BP is good today.   Labs (1/11): LDL 61, HDL 42, creatinine 1.0 Labs (3/11): creatinine 1.2, K 4  Current Medications (verified): 1)  Prilosec Otc 20 Mg  Tbec (Omeprazole Magnesium) .... Once Daily Every Morning 2)  Aspirin 81 Mg Tbec (Aspirin) .... Once Daily 3)  Nitrostat 0.4 Mg Subl (Nitroglycerin) .Marland Kitchen.. 1 Tablet Under Tongue At Onset of Chest Pain; You May Repeat Every 5 Minutes For Up To 3 Doses. 4)  Lisinopril-Hydrochlorothiazide 20-12.5 Mg Tabs (Lisinopril-Hydrochlorothiazide) .... One Tablet Daily 5)  Tamsulosin Hcl 0.4 Mg Caps (Tamsulosin Hcl) .... Take 1 Tab By Mouth At Bedtime  Allergies (verified): No Known Drug Allergies  Past History:  Past Medical History: Reviewed history from 11/10/2009 and no changes required. 1. Nephrolithiasis, hx of 2. Peptic ulcer disease with upper GI bleed in 2009---led to transfusion 3. Hyperlipidemia 4. GERD 5. transient ischemic attack - 2009 6. hypertension  7. C-spine osteoarthritis 8. Chest pain: ETT-myoview (2/11) pt exercised 4', stopped due to fatigue but had bilateral shoulder pain, BP increased to 206/60.  1 mm ST depression in inferolateral leads.  Nuclear images with no ischemia or infarction. Unable to do coronary CTA as unable to beta block HR below 70.  ETT-echo was then done, showing hypertenstive BP response and no evidence for ischemia or infarction.  Suspect shoulder pain was not ischemic.   Family History: Reviewed history  from 10/17/2009 and no changes required. Father: father died colon ca age 48, DM II Mother: died complications appendicitis age 86 Siblings: 1 brother alive BCCE, 1 sisiter died age 64 lupus Family History of Colon CA 1st degree relative <60 Family History Diabetes 1st degree relative No premature CAD  Social History: Reviewed history from 11/17/2007 and no changes required. Retired Married Former Smoker Alcohol use-yes Regular exercise-no 3 children 1 son with OSA, obese, 1 son healthy, 1 daughter breast ca age 7  Review of Systems       All systems were reviewed and negative except as per HPI.   Vital Signs:  Patient profile:   75 year old male Height:      67 inches Weight:      151 pounds BMI:     23.74 Pulse rate:   78 / minute Resp:     16 per minute BP sitting:   132 / 74  (left arm)  Vitals Entered By: Marrion Coy, CNA (May 04, 2010 8:22 AM)  Physical Exam  General:  Well developed, well nourished, in no acute distress. Neck:  Neck supple, no JVD. No masses, thyromegaly or abnormal cervical nodes. Lungs:  Clear bilaterally to auscultation and percussion. Heart:  Non-displaced PMI, chest non-tender; regular rate and rhythm, S1, S2 without murmurs, rubs or gallops. Carotid upstroke normal, no bruit. Pedals normal pulses. No edema, no varicosities. Abdomen:  Bowel sounds positive; abdomen soft and non-tender without masses, organomegaly, or hernias noted. No hepatosplenomegaly. Extremities:  No clubbing or cyanosis. Neurologic:  Alert and oriented x 3.  Psych:  Normal affect.   Impression & Recommendations:  Problem # 1:  HYPERTENSION, UNSPECIFIED (ICD-401.9) BP seems well-controlled on lisinopril/HCT, continue.   Problem # 2:  CHEST PAIN (ICD-786.50) Patient had atypical chest pain but this seems to have resolved.  Continue ASA 81 mg daily.   Problem # 3:  HYPERLIPIDEMIA (ICD-272.4) I suggested that the patient start pravastatin 20 mg daily (very  favorable side effect profile).  He would like to discuss this with Dr. Cato Mulligan.   Patient Instructions: 1)  Your physician recommends that you continue on your current medications as directed. Please refer to the Current Medication list given to you today. 2)  Your physician wants you to follow-up in:  1 year with Dr Shirlee Latch. You will receive a reminder letter in the mail two months in advance. If you don't receive a letter, please call our office to schedule the follow-up appointment.

## 2010-09-27 NOTE — Assessment & Plan Note (Signed)
Summary: 2WK F/U SL OK PER ANNE   Primary Provider:  Dr. Cato Mulligan  CC:  2 week follow up.  Pt states he has not felt well after walks and he feels it has been due to gastritis.  Pt has not taken Simvastatin in about a week.  History of Present Illness: 75 yo with history of hyperlipidemia and GERD returns for evaluation of chest pain.  For the last couple of months, Mr Osterhout had had pain in both shoulders after walking 1/4 to 1/3 of a mile.  If he kept walking, the pain would actually decrease.  It was also associated with mild shortness of breath.  No pain in his chest.  He did not tend to note symptoms with other activities.    Patient had an ETT-myoview done in 2/11.  He exercised 4 minutes and stopped it appears mainly due to hypertensive BP response. He says he was tired but could have kept going a bit.  They stopped him due to high BP.  He did have some pain in his shoulders bilaterally with walking on the treadmill.  There was 1 mm ST depression in the inferolateral leads but the perfusion images were normal with no evidence of ischemia or infarction. To follow up this equivocal study (positive ECG with symptoms but normal perfusion images), we tried to do a coronary CTA.  Unfortunately, he was very anxious on the day of the CT and his heart rate could not be beta blocked below 70 so we switched him over to a stress echo. Stress echo was done in 3/11 with hypertensive BP response but no evidence for ischemia or infarction.  He was started on lisinopril/HCT for HTN.  Also recently, he has been having periumbilical and epigastric pain.  It was thought that this might be gastritis.  Increasing omeprazole to two times a day seems to have mostly resolved the symptoms.  Additionally, he has not had any more of the shoulder pain recently.    Labs (1/11): LDL 61, HDL 42, creatinine 1.0 Labs (3/11): creatinine 1.2, K 4  Current Medications (verified): 1)  Prilosec Otc 20 Mg  Tbec (Omeprazole Magnesium)  .... Once Daily Every Morning and Evening 2)  Simvastatin 20 Mg Tabs (Simvastatin) .... Take One Tablet Every Other Evening 3)  Aspirin 81 Mg Tbec (Aspirin) .... Once Daily 4)  Nitrostat 0.4 Mg Subl (Nitroglycerin) .Marland Kitchen.. 1 Tablet Under Tongue At Onset of Chest Pain; You May Repeat Every 5 Minutes For Up To 3 Doses. 5)  Lisinopril-Hydrochlorothiazide 20-12.5 Mg Tabs (Lisinopril-Hydrochlorothiazide) .... One Tablet Daily  Allergies (verified): No Known Drug Allergies  Past History:  Past Medical History: 1. Nephrolithiasis, hx of 2. Peptic ulcer disease with upper GI bleed in 2009---led to transfusion 3. Hyperlipidemia 4. GERD 5. transient ischemic attack - 2009 6. hypertension  7. C-spine osteoarthritis 8. Chest pain: ETT-myoview (2/11) pt exercised 4', stopped due to fatigue but had bilateral shoulder pain, BP increased to 206/60.  1 mm ST depression in inferolateral leads.  Nuclear images with no ischemia or infarction. Unable to do coronary CTA as unable to beta block HR below 70.  ETT-echo was then done, showing hypertenstive BP response and no evidence for ischemia or infarction.  Suspect shoulder pain was not ischemic.   Family History: Reviewed history from 10/17/2009 and no changes required. Father: father died colon ca age 32, DM II Mother: died complications appendicitis age 80 Siblings: 1 brother alive BCCE, 1 sisiter died age 50 lupus Family History  of Colon CA 1st degree relative <60 Family History Diabetes 1st degree relative No premature CAD  Social History: Reviewed history from 11/17/2007 and no changes required. Retired Married Former Smoker Alcohol use-yes Regular exercise-no 3 children 1 son with OSA, obese, 1 son healthy, 1 daughter breast ca age 73  Review of Systems       All systems reviewed and negative except as per HPI.   Vital Signs:  Patient profile:   75 year old male Height:      67 inches Weight:      158 pounds BMI:     24.84 Pulse rate:    88 / minute Pulse rhythm:   regular BP sitting:   146 / 68  (left arm) Cuff size:   regular  Vitals Entered By: Judithe Modest CMA (November 10, 2009 9:58 AM)  Physical Exam  General:  Well developed, well nourished, in no acute distress. Neck:  Neck supple, no JVD. No masses, thyromegaly or abnormal cervical nodes. Lungs:  Clear bilaterally to auscultation and percussion. Heart:  Non-displaced PMI, chest non-tender; regular rate and rhythm, S1, S2 without murmurs, rubs or gallops. Carotid upstroke normal, no bruit.  Pedals normal pulses. No edema, no varicosities. Abdomen:  Bowel sounds positive; abdomen soft and non-tender without masses, organomegaly, or hernias noted. No hepatosplenomegaly. Extremities:  No clubbing or cyanosis. Neurologic:  Alert and oriented x 3. Psych:  Normal affect.   Impression & Recommendations:  Problem # 1:  CHEST PAIN (ICD-786.50) Patient had bilateral shoulder pain with exertion.  This has now resolved.  I suspect this was musculoskeletal.  He had some ECG changes with ETT-myoview but normal perfusion images.  As this was somewhat equivocal, I tried to do coronary CTA but was unable to beta block heart rate below 70 (probably due to anxiety) so could not do this study.  Instead, we did a stress echo which was negative for ischemia or infarction.  He should continue risk factor modification and take ASA 81 mg daily and statin.   Problem # 2:  HYPERTENSION, UNSPECIFIED (ICD-401.9) Patient's BP has been high at all recent appointments and he had a hypertensive BP response on both treadmill tests.  I started him on lisinopril/HCT 20/12.5 daily.  BP is mildly elevated today but better than prior.  He will take his BP at home and record it.  We will call him in 2 wks to see what it is running.  If still high, will increase meds.    Patient Instructions: 1)  Take and record your blood pressure and pulse--I will call you in 2 weeks to get the readings  Luana Shu [phone]   2)  Your physician wants you to follow-up in: 6 months with Dr Marca Ancona.  You will receive a reminder letter in the mail two months in advance. If you don't receive a letter, please call our office to schedule the follow-up appointment.

## 2010-09-27 NOTE — Progress Notes (Signed)
Summary: Patient's Vitals  Patient's Vitals   Imported By: Marylou Mccoy 02/02/2010 14:33:07  _____________________________________________________________________  External Attachment:    Type:   Image     Comment:   External Document

## 2010-09-27 NOTE — Letter (Signed)
Summary: Donzetta Kohut, DPM  Podiatry-Kathryn Irving Shows, DPM   Imported By: Maryln Gottron 06/13/2009 09:01:40  _____________________________________________________________________  External Attachment:    Type:   Image     Comment:   External Document

## 2010-09-27 NOTE — Assessment & Plan Note (Signed)
Summary: hospital follow up/et   Vital Signs:  Patient Profile:   75 Years Old Male Weight:      174 pounds Temp:     98.2 degrees F Pulse rate:   84 / minute Resp:     14 per minute BP sitting:   132 / 68                 Chief Complaint:  f/u hosp.  History of Present Illness: ?TIA-patient evaluated in hospital.  Records reviewed.  His symptoms have completely resolved.  He denies headache, neurologic deficits.     Current Allergies: No known allergies   Past Medical History:    Reviewed history from 11/17/2007 and no changes required:       Nephrolithiasis, hx of       Peptic ulcer disease---led to transfusion  Past Surgical History:    Reviewed history from 11/17/2007 and no changes required:       blood transfusion from bleeding ulcer   Family History:    Reviewed history from 11/17/2007 and no changes required:       Father: father died colon ca age 51, DM II       Mother: died complications appendicitis age 51       Siblings: 1 brother alive BCCE, 1 sisiter died age 11 lupus       Family History of Colon CA 1st degree relative <60       Family History Diabetes 1st degree relative  Social History:    Reviewed history from 11/17/2007 and no changes required:       Retired       Married       Former Smoker       Alcohol use-yes       Regular exercise-no       3 children 1 son with OSA, obese, 1 son healthy, 1 daughter breast ca age 11    Review of Systems       no other complaints in a complete ROS    Physical Exam  General:     Well-developed,well-nourished,in no acute distress; alert,appropriate and cooperative throughout examination Head:     normocephalic and atraumatic.   Eyes:     pupils equal and pupils round.   Ears:     R ear normal and L ear normal.   Neck:     No deformities, masses, or tenderness noted. Chest Wall:     No deformities, masses, tenderness or gynecomastia noted. Lungs:     Normal respiratory effort, chest  expands symmetrically. Lungs are clear to auscultation, no crackles or wheezes. Heart:     Normal rate and regular rhythm. S1 and S2 normal without gallop, murmur, click, rub or other extra sounds.    Impression & Recommendations:  Problem # 1:  VISUAL IMPAIRMENT (ICD-368.10) unclear etiology. Possible stroke.  Reviewed hospital evaluation.  Will schedule laboratories.  Complete Medication List: 1)  Prilosec Otc 20 Mg Tbec (Omeprazole magnesium) .... Once daily   Patient Instructions: 1)  schedule fasting labs at pt's convenience 2)  labs one week prior to visit 3)  lipids---272.4 4)  lfts-995.2 5)  bmet-995.2 6)  A1C-790.6 7)       ]  Clinical Data: Episode of blurred vision. High blood pressure.   MRI BRAIN WITHOUT CONTRAST:   Technique:  Multiplanar and multiecho pulse sequences of the brain   and surrounding structures were obtained according to standard   protocol without IV contrast.  Comparison: 01/12/02. Prior CT 11/04/07.   Findings: No acute infarct.  No hydrocephalus. No intracranial mass   detected on this unenhanced exam.   IMPRESSION:   No acute infarct.   MR ANGIOGRAPHY OF HEAD:   Technique:  3-D time of flight pulse sequence was performed to   examine the cerebral vasculature, centered at the circle of Willis,   without IV contrast.  Multiplanar MR image reconstructions were   generated to evaluate the vascular anatomy.   Findings: Suggestion of mild narrowing of the cavernous   segment/supraclinoid segment of the internal carotid arteries   bilaterally that has an appearance suggesting this is artifactual in   origin rather than representing true stenosis.  Left PICA is not well   visualized. Mild ectasia of the vertebrobasilar system. Slight   narrowing mid aspect of the basilar artery.  Mild branch vessel   irregularity may represent limitation of present exam or mild   atherosclerotic type changes.  No aneurysm noted.   IMPRESSION:   No medium  or large sized vessel significant stenosis or occlusion.   Please see above.    Read By:  Fuller Canada,  M.D.   Released By:  Fuller Canada,  M.D.  Additional Information  HL7 RESULT STATUS : F  External image : [id number]  External IF Update Timestamp : 2007-11-07:13:49:[id number]

## 2010-09-27 NOTE — Assessment & Plan Note (Signed)
Summary: Cardiology Nuclear Study  Nuclear Med Background Indications for Stress Test: Evaluation for Ischemia   History: Echo  History Comments: 11/04/07 Echo:EF=70-75%  Symptoms: Chest Pressure, Chest Pressure with Exertion, Diaphoresis  Symptoms Comments: Last episode of CP:2 weeks ago.   Nuclear Pre-Procedure Cardiac Risk Factors: Carotid Disease, History of Smoking, Lipids Caffeine/Decaff Intake: None NPO After: 8:00 PM Lungs: Clear IV 0.9% NS with Angio Cath: 22g     IV Site: (L) Hand IV Started by: Irean Hong RN Chest Size (in) 42     Height (in): 67 Weight (lb): 167 BMI: 26.25  Nuclear Med Study 1 or 2 day study:  1 day     Stress Test Type:  Stress Reading MD:  Charlton Haws, MD     Referring MD:  Birdie Sons, MD Resting Radionuclide:  Technetium 44m Tetrofosmin     Resting Radionuclide Dose:  10.0 mCi  Stress Radionuclide:  Technetium 57m Tetrofosmin     Stress Radionuclide Dose:  33.0 mCi   Stress Protocol Exercise Time (min):  4:00 min     Max HR:  151 bpm     Predicted Max HR:  149 bpm  Max Systolic BP: 206 mm Hg     Percent Max HR:  101.34 %     METS: 4.8 Rate Pressure Product:  84696    Stress Test Technologist:  Rea [employer] CMA-N     Nuclear Technologist:  Burna Mortimer Deal RT-N  Rest Procedure  Myocardial perfusion imaging was performed at rest 45 minutes following the intravenous administration of Myoview Technetium 30m Tetrofosmin.  Stress Procedure  The patient exercised for four minutes.  The patient stopped due to fatigue.  He denied any chest pain, but did c/o slight pressure in both shoulders.  There were ST-T wave changes with exercise.  He had a hypertensive response to exercise, 206/60.  Myoview was injected at peak exercise and myocardial perfusion imaging was performed after a brief delay.  QPS Raw Data Images:  Normal; no motion artifact; normal heart/lung ratio. Stress Images:  NI: Uniform and normal uptake of tracer in all myocardial  segments. Rest Images:  Normal homogeneous uptake in all areas of the myocardium. Subtraction (SDS):  Normal Transient Ischemic Dilatation:  .95  (Normal <1.22)  Lung/Heart Ratio:  .20  (Normal <0.45)  Quantitative Gated Spect Images QGS EDV:  52 ml QGS ESV:  10 ml QGS EF:  82 % QGS cine images:  normal  Findings Low risk nuclear study Clinically Abnormal (chest pain, ST abnormality, hypotension)      Overall Impression  Exercise Capacity: Poor exercise capacity. BP Response: Hypertensive blood pressure response. Clinical Symptoms: Dyspnea ECG Impression: 1mm ST segment depression in inferolateral leads Overall Impression: No ischemia or infarction on nuclear images Overall Impression Comments: Clinically abnormal with poor exercise tolerance, positive ECG, hypertensive response to exercise and dyspnea  Appended Document: Cardiology Nuclear Study call patient. pictures look normal so overall nuclear study is ok BUT poor exercise tolerance and EKG changes with exercise---I'd like him to see cardiology  Appended Document: Cardiology Nuclear Study Left message to call back   Appended Document: Orders Update    Clinical Lists Changes  Orders: Added new Referral order of Cardiology Referral (Cardiology) - Signed      Appended Document: Orders Update    Clinical Lists Changes  Orders: Added new Referral order of Cardiology Referral (Cardiology) - Signed

## 2010-09-27 NOTE — Procedures (Signed)
Summary: Colonoscopy Report/[facility]  Colonoscopy Report/[facility]   Imported By: Maryln Gottron 08/09/2008 15:06:28  _____________________________________________________________________  External Attachment:    Type:   Image     Comment:   External Document

## 2010-09-27 NOTE — Assessment & Plan Note (Signed)
Summary: ab pain//ccm   Vital Signs:  Patient profile:   75 year old male Weight:      160 pounds Temp:     98.5 degrees F oral BP sitting:   130 / 68  (left arm) Cuff size:   regular  Vitals Entered By: Duard Brady LPN (November 06, 2009 8:36 AM) CC: c/o abd pain - on/off x2wks , upper abd and (R)  flank , had stress test fri , after which he had increased abd pain  - last colo 3 yrs ago Is Patient Diabetic? No   Primary Care Provider:  Dr. Cato Mulligan  CC:  c/o abd pain - on/off x2wks , upper abd and (R)  flank , had stress test fri , and after which he had increased abd pain  - last colo 3 yrs ago.  History of Present Illness: 75 year old patient who presents with a 3 week history of epigastric and right upper quadrant discomfort.  He describes a sense of fullness and gaseousness is relieved by belching.  He does have a remote history of peptic ulcer disease and has been on chronic Prilosec.  Has occasional nausea, but no vomiting.  His bowel habits have been stable.  His last colonoscopy was 3 years ago.  He has had a recent cardiac evaluation.  Preventive Screening-Counseling & Management  Alcohol-Tobacco     Smoking Status: never  Allergies (verified): No Known Drug Allergies  Past History:  Past Medical History: Reviewed history from 10/17/2009 and no changes required. 1. Nephrolithiasis, hx of 2. Peptic ulcer disease with upper GI bleed in 2009---led to transfusion 3. Hyperlipidemia 4. GERD 5. transient ischemic attack - 2009 6. hypertension  7. C-spine osteoarthritis 8. Chest pain: ETT-myoview (2/11) pt exercised 4', stopped due to fatigue but had bilateral shoulder pain, BP increased to 206/60.  1 mm ST depression in inferolateral leads.  Nuclear images with no ischemia or infarction.   Social History: Smoking Status:  never  Review of Systems       The patient complains of abdominal pain.  The patient denies anorexia, fever, weight loss, weight gain, vision  loss, decreased hearing, hoarseness, chest pain, syncope, dyspnea on exertion, peripheral edema, prolonged cough, headaches, hemoptysis, melena, hematochezia, severe indigestion/heartburn, hematuria, incontinence, genital sores, muscle weakness, suspicious skin lesions, transient blindness, difficulty walking, depression, unusual weight change, abnormal bleeding, enlarged lymph nodes, angioedema, breast masses, and testicular masses.    Physical Exam  General:  Well-developed,well-nourished,in no acute distress; alert,appropriate and cooperative throughout examination Head:  Normocephalic and atraumatic without obvious abnormalities. No apparent alopecia or balding. Eyes:  No corneal or conjunctival inflammation noted. EOMI. Perrla. Funduscopic exam benign, without hemorrhages, exudates or papilledema. Vision grossly normal. Mouth:  Oral mucosa and oropharynx without lesions or exudates.  Teeth in good repair. Neck:  No deformities, masses, or tenderness noted. Lungs:  Normal respiratory effort, chest expands symmetrically. Lungs are clear to auscultation, no crackles or wheezes. Heart:  Normal rate and regular rhythm. S1 and S2 normal without gallop, murmur, click, rub or other extra sounds. rate 90 to 95 Abdomen:  Bowel sounds positive,abdomen soft and non-tender without masses, organomegaly or hernias noted. Msk:  No deformity or scoliosis noted of thoracic or lumbar spine.     Impression & Recommendations:  Problem # 1:  GERD (ICD-530.81)  His updated medication list for this problem includes:    Prilosec Otc 20 Mg Tbec (Omeprazole magnesium) ..... Once daily every morning and evening  His updated medication list for  this problem includes:    Prilosec Otc 20 Mg Tbec (Omeprazole magnesium) ..... Once daily every morning and evening  Problem # 2:  PEPTIC ULCER DISEASE (ICD-533.90)  His updated medication list for this problem includes:    Prilosec Otc 20 Mg Tbec (Omeprazole magnesium)  ..... Once daily every morning and evening  His updated medication list for this problem includes:    Prilosec Otc 20 Mg Tbec (Omeprazole magnesium) ..... Once daily every morning and evening  Complete Medication List: 1)  Prilosec Otc 20 Mg Tbec (Omeprazole magnesium) .... Once daily every morning and evening 2)  Simvastatin 20 Mg Tabs (Simvastatin) .... Take one tablet every other evening 3)  Aspirin 81 Mg Tbec (Aspirin) .... Once daily 4)  Nitrostat 0.4 Mg Subl (Nitroglycerin) .Marland Kitchen.. 1 tablet under tongue at onset of chest pain; you may repeat every 5 minutes for up to 3 doses. 5)  Lisinopril-hydrochlorothiazide 20-12.5 Mg Tabs (Lisinopril-hydrochlorothiazide) .... One tablet daily  Patient Instructions: 1)  Avoid foods high in acid (tomatoes, citrus juices, spicy foods). Avoid eating within two hours of lying down or before exercising. Do not over eat; try smaller more frequent meals. Elevate head of bed twelve inches when sleeping. Prescriptions: PRILOSEC OTC 20 MG  TBEC (OMEPRAZOLE MAGNESIUM) once daily every morning and evening  #180 x 4   Entered and Authorized by:   Gordy Savers  MD   Signed by:   Gordy Savers  MD on 11/06/2009   Method used:   Print then Give to Patient   RxID:   [id number]

## 2010-09-27 NOTE — Assessment & Plan Note (Signed)
Summary: go over labs/jls   Vital Signs:  Patient Profile:   75 Years Old Male Temp:     98 degrees F Pulse rate:   84 / minute Resp:     16 per minute BP sitting:   124 / 66                 Chief Complaint:  f/u.    Current Allergies: No known allergies   Past Medical History:    Nephrolithiasis, hx of    Peptic ulcer disease---led to transfusion    Hyperlipidemia    GERD        Impression & Recommendations:  Problem # 1:  HYPERGLYCEMIA (ICD-790.29) no need for meds Labs Reviewed: HgBA1c: 5.6 (11/20/2007)   Creat: 1.0 (11/20/2007)      Problem # 2:  GERD (ICD-530.81) continue current meds His updated medication list for this problem includes:    Prilosec Otc 20 Mg Tbec (Omeprazole magnesium) ..... Once daily   Problem # 3:  HYPERLIPIDEMIA (ICD-272.4) great control His updated medication list for this problem includes:    Simvastatin 40 Mg Tabs (Simvastatin) ..... One by mouth daily  Labs Reviewed: Chol: 105 (02/08/2008)   HDL: 35.4 (02/08/2008)   LDL: 57 (02/08/2008)   TG: 61 (02/08/2008) SGOT: 20 (02/08/2008)   SGPT: 24 (02/08/2008)   Complete Medication List: 1)  Prilosec Otc 20 Mg Tbec (Omeprazole magnesium) .... Once daily 2)  Simvastatin 40 Mg Tabs (Simvastatin) .... One by mouth daily   Patient Instructions: 1)  Please schedule a follow-up appointment in 6 months. 2)  labs one week prior to visit 3)  lipids---272.4 4)  lfts-995.2 5)  bmet-995.2 6)  A1C-250.02 7)      Prescriptions: SIMVASTATIN 40 MG  TABS (SIMVASTATIN) one by mouth daily  #90 x 3   Entered and Authorized by:   Birdie Sons MD   Signed by:   Birdie Sons MD on 02/22/2008   Method used:   Print then Give to Patient   RxID:   [id number]  ]

## 2010-09-27 NOTE — Progress Notes (Signed)
Summary: REFILL MEDS  Phone Note Refill Request Call back at Home Phone [phone] Message from:  Patient on May 04, 2010 11:47 AM  Refills Requested: Medication #1:  LISINOPRIL-HYDROCHLOROTHIAZIDE 20-12.5 MG TABS one tablet daily [facility]  [phone]. 90 DAY SUPPLY WITH 3 REFILL    Method Requested: Fax to [facility] Initial call taken by: Lorne Skeens,  May 04, 2010 11:49 AM Caller: Patient  Follow-up for Phone Call       Follow-up by: Judithe Modest CMA,  May 04, 2010 2:29 PM    Prescriptions: LISINOPRIL-HYDROCHLOROTHIAZIDE 20-12.5 MG TABS (LISINOPRIL-HYDROCHLOROTHIAZIDE) one tablet daily  #90 x 3   Entered by:   Judithe Modest CMA   Authorized by:   Marca Ancona, MD   Signed by:   Judithe Modest CMA on 05/04/2010   Method used:   Electronically to        [facility]* (mail-order)       [address]       [place], [place]  [address]       Ph: [phone]       Fax: [phone]   RxID:   [id number]

## 2010-10-03 NOTE — Letter (Signed)
Summary: [facility]  [facility]   Imported By: Maryln Gottron 09/25/2010 12:35:26  _____________________________________________________________________  External Attachment:    Type:   Image     Comment:   External Document

## 2010-10-05 ENCOUNTER — Other Ambulatory Visit: Payer: Self-pay | Admitting: Gastroenterology

## 2010-10-05 DIAGNOSIS — R110 Nausea: Secondary | ICD-10-CM

## 2010-10-09 ENCOUNTER — Ambulatory Visit
Admission: RE | Admit: 2010-10-09 | Discharge: 2010-10-09 | Disposition: A | Discharge status: Home / Self Care | Payer: PRIVATE HEALTH INSURANCE | Source: Ambulatory Visit | Attending: Gastroenterology | Admitting: Gastroenterology

## 2010-10-09 DIAGNOSIS — R110 Nausea: Secondary | ICD-10-CM

## 2010-10-15 ENCOUNTER — Other Ambulatory Visit (HOSPITAL_COMMUNITY): Payer: Self-pay | Admitting: Gastroenterology

## 2010-10-15 DIAGNOSIS — R110 Nausea: Secondary | ICD-10-CM

## 2010-10-24 ENCOUNTER — Ambulatory Visit (HOSPITAL_COMMUNITY)
Admission: RE | Admit: 2010-10-24 | Discharge: 2010-10-24 | Disposition: A | Discharge status: Home / Self Care | Payer: Medicare Other | Source: Ambulatory Visit | Attending: Gastroenterology | Admitting: Gastroenterology

## 2010-10-24 DIAGNOSIS — R110 Nausea: Secondary | ICD-10-CM | POA: Insufficient documentation

## 2010-10-24 DIAGNOSIS — R1011 Right upper quadrant pain: Secondary | ICD-10-CM | POA: Insufficient documentation

## 2010-10-24 MED ORDER — TECHNETIUM TC 99M MEBROFENIN IV KIT
5.0000 | PACK | Freq: Once | INTRAVENOUS | Status: AC | PRN
  Administered 2010-10-24: 5 via INTRAVENOUS

## 2010-11-26 ENCOUNTER — Ambulatory Visit (INDEPENDENT_AMBULATORY_CARE_PROVIDER_SITE_OTHER): Payer: Medicare Other | Admitting: Internal Medicine

## 2010-11-26 ENCOUNTER — Encounter: Payer: Self-pay | Admitting: Internal Medicine

## 2010-11-26 DIAGNOSIS — R5383 Other fatigue: Secondary | ICD-10-CM

## 2010-11-26 DIAGNOSIS — R5381 Other malaise: Secondary | ICD-10-CM

## 2010-11-26 DIAGNOSIS — Z2911 Encounter for prophylactic immunotherapy for respiratory syncytial virus (RSV): Secondary | ICD-10-CM

## 2010-11-26 DIAGNOSIS — R634 Abnormal weight loss: Secondary | ICD-10-CM

## 2010-11-26 DIAGNOSIS — Z0000 Encounter for general adult medical examination without abnormal findings: Secondary | ICD-10-CM

## 2010-11-26 DIAGNOSIS — I10 Essential (primary) hypertension: Secondary | ICD-10-CM

## 2010-11-26 LAB — TSH: TSH: 2.3 u[IU]/mL (ref 0.35–5.50)

## 2010-11-26 NOTE — Progress Notes (Signed)
  Subjective:    Patient ID: [name], male    DOB: [birth year], 75 y.o.   MRN: [id number]  HPI  75 year old patient who is seen today for followup. He has multiple somatic complaints including bitemporal pressure sensation. He is treated hypertension but held his medication yesterday due to 2 a slightly low blood pressure reading. His main complaints are GI related. He has seen Dr. Randa Evens and has had a full GI evaluation. He was hospitalized in January. Complaints include anorexia ongoing weight loss and periods of extreme fatigue and weakness. He is accompanied by his wife today who states that he has periods of approximately 7 days that  he is quite weak and anorexic.    Review of Systems  Constitutional: Positive for fatigue and unexpected weight change. Negative for fever, chills and appetite change.  HENT: Positive for congestion. Negative for hearing loss, ear pain, sore throat, trouble swallowing, neck stiffness, dental problem, voice change and tinnitus.   Eyes: Negative for pain, discharge and visual disturbance.  Respiratory: Negative for cough, chest tightness, wheezing and stridor.   Cardiovascular: Negative for chest pain, palpitations and leg swelling.  Gastrointestinal: Negative for nausea, vomiting, abdominal pain, diarrhea, constipation, blood in stool and abdominal distention.  Genitourinary: Negative for urgency, hematuria, flank pain, discharge, difficulty urinating and genital sores.  Musculoskeletal: Negative for myalgias, back pain, joint swelling, arthralgias and gait problem.  Skin: Negative for rash.  Neurological: Positive for weakness and light-headedness. Negative for dizziness, syncope, speech difficulty, numbness and headaches.  Hematological: Negative for adenopathy. Does not bruise/bleed easily.  Psychiatric/Behavioral: Negative for behavioral problems and dysphoric mood. The patient is not nervous/anxious.        Objective:   Physical Exam    Constitutional: He is oriented to person, place, and time. He appears well-developed.  HENT:  Head: Normocephalic.  Right Ear: External ear normal.  Left Ear: External ear normal.  Eyes: Conjunctivae and EOM are normal.  Neck: Normal range of motion.  Cardiovascular: Normal rate and normal heart sounds.   Pulmonary/Chest: Effort normal and breath sounds normal. No respiratory distress. He has no wheezes.  Abdominal: Soft. Bowel sounds are normal. He exhibits no distension. There is no tenderness. There is no rebound and no guarding.  Musculoskeletal: Normal range of motion. He exhibits no edema and no tenderness.  Neurological: He is alert and oriented to person, place, and time.  Psychiatric: He has a normal mood and affect. His behavior is normal.          Assessment & Plan:  Malaise weight loss.  Patient has had a full GI evaluation. His TSH was borderline elevated we'll recheck and also check a sedimentation rate. Wonder about the possibility of depression. Will followup in one month to consider SSRI at that time Hypertension stable History of gastroesophageal reflux disease and remote peptic ulcer disease. Will continue omeprazole

## 2010-11-26 NOTE — Patient Instructions (Signed)
It is important that you exercise regularly, at least 20 minutes 3 to 4 times per week.  If you develop chest pain or shortness of breath seek  medical attention.  Return in one month for follow-up  For  followup with Dr. Cato Mulligan

## 2010-11-27 LAB — SEDIMENTATION RATE: Sed Rate: 5 mm/hr (ref 0–22)

## 2010-12-24 ENCOUNTER — Telehealth: Payer: Self-pay | Admitting: Internal Medicine

## 2010-12-24 NOTE — Telephone Encounter (Signed)
Pt had to cancel his appt for this Wednesday  because of unforeseen issues. However, he would like for the nurse to call him with his lab results.

## 2010-12-25 NOTE — Telephone Encounter (Signed)
Left message for pt re labs.

## 2010-12-25 NOTE — Telephone Encounter (Signed)
Labs look okay.

## 2010-12-26 ENCOUNTER — Ambulatory Visit: Payer: PRIVATE HEALTH INSURANCE | Admitting: Internal Medicine

## 2011-01-08 NOTE — H&P (Signed)
[name], [name]                 ACCOUNT NO.:  [id number]   MEDICAL RECORD NO.:  [id number]          PATIENT TYPE:  INP   LOCATION:  1827                         FACILITY:  [facility]   PHYSICIAN:  Kela Millin, M.D.DATE OF BIRTH:  09-28-1935   DATE OF ADMISSION:  11/03/2007  DATE OF DISCHARGE:                              HISTORY & PHYSICAL   PRIMARY CARE PHYSICIAN:  Casey Mole. Swords, MD.   CHIEF COMPLAINT:  Transient blurred vision in the left eye.   HISTORY OF PRESENT ILLNESS:  The patient is a 75 year old former smoker  who presents with the above complaints.  He states that he was in his  usual state of health and actually just prior to this episode was  working in his garden moving rocks and thereafter he was grilling and  suddenly noticed that he was unable to see the full length of grill  green rods with his left eye.  He states that this lasted for about 2  hours.  By the time he was seen in the ER, his vision was back to  normal.  He admits to some dizziness/lightheadedness.  He denies focal  weakness, slurred speech, dysphagia, palpitations, cough, fevers, chest  pain, melena, and no dysuria.   The patient was seen in the ER and a CT scan of his brain revealed  increased density in proximal basilar artery that might be an artifact  and was similar in 2007, but per radiologist cannot exclude a clot given  his symptoms.  No cortically-based infarct identified and otherwise  stable brain and chronic small-vessel disease.  His blood pressure on  arrival was 193/93 initially and on recheck 158/85.  He denies any  history of hypertension.  He is admitted for further evaluation and  management.   PAST MEDICAL HISTORY:  History of ulcers.   MEDICATIONS:  Prilosec.   ALLERGIES:  No known drug allergies.   SOCIAL HISTORY:  He quit tobacco 25-30 years ago.  He drinks one to two  glasses of wine per week.   FAMILY HISTORY:  His father had diabetes and he died at age 87  with  colon cancer.   REVIEW OF SYSTEMS:  As per HPI, other review of systems negative.   PHYSICAL EXAM:  GENERAL:  The patient is an elderly white male, in no  apparent distress.  Well-developed, well-nourished.  VITAL SIGNS:  His temperature is 97.8, blood pressure 158/85, initially  193/93, his pulse is 83, respiratory rate 16, O2 saturation of 97%.  HEENT:  PERRL, EOMI, sclerae anicteric, moist mucous membranes.  No oral  exudates.  No facial asymmetry.  NECK:  Supple, no adenopathy and no carotid bruits appreciated.  No  thyromegaly.  LUNGS:  Moderate air movement, no wheezes.  CARDIOVASCULAR:  Regular rate and rhythm.  Normal S1 and S2.  No S3  appreciated.  ABDOMEN:  Soft, bowel sounds present, nontender, nondistended.  No  organomegaly and no masses palpable.  EXTREMITIES:  No cyanosis and no edema.  NEUROLOGIC:  He is alert and oriented x3.  Cranial nerves II-XII are  grossly  intact.  His strength is 5/5 and symmetric.  Sensory grossly  intact.  Nonfocal exam.   LABORATORY DATA:  CT scan of head as above.  His white cell count is 8.8  with a hemoglobin of 15.2, hematocrit of 43.9, platelet count is 206.  Sodium is 138, potassium 4.1, chloride is 105 with a CO2 of 25, glucose  135, BUN of 15, creatinine 0.97, calcium is 10.2, total protein is 7.0,  albumin is 4.5, AST is 28, ALT is 22.  INR is 0.9.   ASSESSMENT AND PLAN:  1. Left blurry vision, question visual field cuts, transient:      Probable transient ischemic attack.  Will obtain MRI/MRA, 2-D echo,      carotid Doppler ultrasound.  Start the patient on aspirin.  Follow      and consider a neuro consult pending above.  2. Elevated blood pressure:  As noted above, no prior history of      hypertension.  Monitor and start antihypertensives if remaining      elevated.  3. History of ulcer:  Continue PPI.      Kela Millin, M.D.  Electronically Signed     ACV/MEDQ  D:  11/04/2007  T:  11/04/2007  Job:   [id number]   cc:   Casey Mole. Swords, MD  [address] [place]  [place] [address]

## 2011-01-10 ENCOUNTER — Ambulatory Visit: Payer: PRIVATE HEALTH INSURANCE | Admitting: Internal Medicine

## 2011-01-11 ENCOUNTER — Ambulatory Visit (INDEPENDENT_AMBULATORY_CARE_PROVIDER_SITE_OTHER): Payer: Medicare Other | Admitting: Internal Medicine

## 2011-01-11 ENCOUNTER — Ambulatory Visit: Payer: PRIVATE HEALTH INSURANCE | Admitting: Internal Medicine

## 2011-01-11 DIAGNOSIS — E785 Hyperlipidemia, unspecified: Secondary | ICD-10-CM

## 2011-01-11 DIAGNOSIS — I10 Essential (primary) hypertension: Secondary | ICD-10-CM

## 2011-01-11 NOTE — Discharge Summary (Signed)
[name], [name]                 ACCOUNT NO.:  [id number]   MEDICAL RECORD NO.:  [id number]          PATIENT TYPE:  INP   LOCATION:  5524                         FACILITY:  [facility]   PHYSICIAN:  Valerie A. Felicity Coyer, MDDATE OF BIRTH:  Aug 29, 1935   DATE OF ADMISSION:  11/03/2007  DATE OF DISCHARGE:  11/04/2007                               DISCHARGE SUMMARY   DISCHARGE DIAGNOSIS:  Transient blurred vision in left eye.   HISTORY OF PRESENT ILLNESS:  [name] is a 75 year old white male, who  presented to the emergency room on the day of admission with transient  blurred vision in the left eye.  The patient reported he was in his  usual state of health and actually working in the garden and outside  grilling just prior to onset of symptoms.  The patient states that he  noticed he was unable to be see full length of the grill with green rod  in his left eye.  The patient reports symptoms lasted approximately 2  hours and had subsided upon evaluation in the ER.  The patient also  admitted to some dizziness and lightheadedness.  He denied any focal  weakness, slurred speech, dysphagia, palpitations, cough, fevers, or  chest pain.  Upon evaluation in the ER, the patient's CT of the head  revealed increased density in proximal basilar artery consistent with  artifact and similar to 2007, however, radiologist could not exclude  clot given the patient's symptoms.  The patient was admitted at that  time for further evaluation and complete stroke workup.   PAST MEDICAL HISTORY:  1. History of gastric ulcers.  2. Former tobacco abuse.   COURSE OF HOSPITALIZATION:  Transient blurred vision of left eye.  As  mentioned above, CT scan could not rule out a clot given the patient's  symptoms.  The patient was admitted to telemetry unit.  MRI/MRA of the  brain was obtained, which was negative for any acute findings.  A 2-D  echo revealed LVEF of 70%-75% consistent with mild diastolic  dysfunction.   Bilateral carotid Dopplers with 40%-60% right ICA stenosis  and no left ICA stenosis.  The patient with no signs or symptoms of  infection.  Cardiac enzymes were negative x1.  Total CK was slightly  elevated at 381, which may be related to the patient's working in the  yard just prior to admission.  As mentioned above, the patient's  symptoms had resolved upon his evaluation in the ER.  He denied any  recurrent headache, eye pain, or vision changes.  Physical exam revealed  no neurological deficits.  Question of whether or not symptoms due to  TIA versus foreign objects in eyes, the patient was working the yard at  the time of incidence.  The patient was not placed on aspirin secondary  to history of peptic ulcer disease with history of GI bleed.   MEDICATIONS:  At the time of discharge, the patient on Prilosec 20 mg  p.o. daily.   LABORATORY DATA:  Pertinent lab work at the time of discharge, white  cell count 8.8, platelets  216, hemoglobin 15.2, hematocrit 43.9.  Sodium  138, potassium 4.1, BUN 15, creatinine 0.97.  ESR 5.  CK-MB and troponin  negative x1.   DISPOSITION:  The patient with vital signs stable throughout  hospitalization.  He is felt medically stable for discharge home.  He is  instructed to follow up with his primary care physician, Dr. Birdie Sons, on November 18, 2007, at 4:15 p.m.  The patient is instructed to  return to the ER or call the office for any recurrent blurred vision,  any focal weakness, unsteady gait, or slurred speech.      Cordelia Pen, NP      Raenette Rover. Felicity Coyer, MD  Electronically Signed    LE/MEDQ  D:  11/30/2007  T:  12/01/2007  Job:  [id number]   cc:   Valetta Mole. Swords, MD

## 2011-01-11 NOTE — Progress Notes (Signed)
  Subjective:    Patient ID: [name], male    DOB: 10/30/1935, 75 y.o.   MRN: [id number]  HPI Sharp pains in legs and knee-he thinks related to travel and caring for ill mother in law---started at least one month ago  Has noted a vein in temporal area---has lost weight (purposeful)  Lesions on scalp---he will see dermatology  Past Medical History  Diagnosis Date  . BENIGN PROSTATIC HYPERTROPHY, WITH OBSTRUCTION 01/15/2010  . GERD 02/22/2008  . HYPERGLYCEMIA 11/18/2007  . HYPERLIPIDEMIA 02/22/2008  . HYPERTENSION, UNSPECIFIED 11/10/2009  . NEPHROLITHIASIS, HX OF 11/17/2007  . PEPTIC ULCER DISEASE 11/17/2007  . TIA (transient ischemic attack)   . Arthritis     osteo - c-spine  . Transfusion history     bleeding ulcer   No past surgical history on file.  reports that he quit smoking about 32 years ago. He has never used smokeless tobacco. He reports that he drinks alcohol. He reports that he does not use illicit drugs. family history is not on file. No Known Allergies    Review of Systems  patient denies chest pain, shortness of breath, orthopnea. Denies lower extremity edema, abdominal pain, change in appetite, change in bowel movements. Patient denies rashes, musculoskeletal complaints. No other specific complaints in a complete review of systems.      Objective:   Physical Exam  well-developed well-nourished male in no acute distress. HEENT exam atraumatic, normocephalic, neck supple without jugular venous distention. Chest clear to auscultation cardiac exam S1-S2 are regular. Abdominal exam overweight with bowel sounds, soft and nontender. Extremities no edema. Neurologic exam is alert with a normal gait.        Assessment & Plan:

## 2011-01-11 NOTE — Assessment & Plan Note (Signed)
Reviewed labs controlled

## 2011-01-11 NOTE — Op Note (Signed)
   NAME:  [name], [name]                           ACCOUNT NO.:  [id number]   MEDICAL RECORD NO.:  [id number]                   PATIENT TYPE:  AMB   LOCATION:  ENDO                                 FACILITY:  [facility]   PHYSICIAN:  James L. Malon Kindle., M.D.          DATE OF BIRTH:  07/12/1936   DATE OF PROCEDURE:  03/28/2003  DATE OF DISCHARGE:                                 OPERATIVE REPORT   PROCEDURE:  Colonoscopy.   MEDICATIONS:  Fentanyl 50 mcg, Versed 5 mg IV.   INDICATIONS:  The patient has a strong family history of colon cancer.  This  is done as a five-year followup.   DESCRIPTION OF PROCEDURE:  The procedure had been explained to the patient  and consent obtained.  With the patient in the left lateral decubitus  position, the Olympus adjustable pediatric colonoscope was inserted and  advanced.  Prep was good.  Using abdominal pressure and position change, we  were able the advance to the cecum.  The ileocecal valve and appendiceal  orifice were seen.  The scope was withdrawn into the cecum, ascending colon,  hepatic flexure, transverse colon, splenic flexure, descending, and sigmoid  colon were seen well upon removal.  No polyps or other lesions were seen.  The rectum was free of polyps.  The scope was withdrawn.  The patient  tolerated the procedure well.   ASSESSMENT:  Strong family history of colon cancer.  There were no polyps on  colonoscopy.   PLAN:  Will recommend repeating in five years.                                                James L. Malon Kindle., M.D.    [name]  D:  03/28/2003  T:  03/28/2003  Job:  [id number]   cc:   Gaynelle Cage, MD  [phone] [address]  [place]  [place] [address]  Fax: [phone]

## 2011-01-11 NOTE — Assessment & Plan Note (Signed)
Fair control Continue current meds 

## 2011-01-29 ENCOUNTER — Encounter: Payer: Self-pay | Admitting: Cardiology

## 2011-02-21 ENCOUNTER — Ambulatory Visit (INDEPENDENT_AMBULATORY_CARE_PROVIDER_SITE_OTHER): Payer: Medicare Other | Admitting: Cardiology

## 2011-02-21 ENCOUNTER — Encounter: Payer: Self-pay | Admitting: Cardiology

## 2011-02-21 DIAGNOSIS — I10 Essential (primary) hypertension: Secondary | ICD-10-CM

## 2011-02-21 DIAGNOSIS — M79606 Pain in leg, unspecified: Secondary | ICD-10-CM

## 2011-02-21 DIAGNOSIS — I739 Peripheral vascular disease, unspecified: Secondary | ICD-10-CM

## 2011-02-21 DIAGNOSIS — M79609 Pain in unspecified limb: Secondary | ICD-10-CM

## 2011-02-21 DIAGNOSIS — E785 Hyperlipidemia, unspecified: Secondary | ICD-10-CM

## 2011-02-21 NOTE — Patient Instructions (Signed)
Your physician recommends that you schedule a follow-up appointment in: YEAR WITH DR [facility]  Your physician recommends that you continue on your current medications as directed. Please refer to the Current Medication list given to you today.  Your physician has requested that you have an ankle brachial index (ABI). During this test an ultrasound and blood pressure cuff are used to evaluate the arteries that supply the arms and legs with blood. Allow thirty minutes for this exam. There are no restrictions or special instructions.  DX CLAUDICATION

## 2011-02-23 DIAGNOSIS — M79606 Pain in leg, unspecified: Secondary | ICD-10-CM | POA: Insufficient documentation

## 2011-02-23 NOTE — Assessment & Plan Note (Signed)
BP well controlled.

## 2011-02-23 NOTE — Assessment & Plan Note (Signed)
Lipids well-controlled when checked in 1/12.

## 2011-02-23 NOTE — Assessment & Plan Note (Signed)
Patient has right thigh pain that is not classic for claudication.  However, he does have a diminished dorsalis pedis pulse on that side.  I will get peripheral arterial dopplers to assess.

## 2011-02-23 NOTE — Progress Notes (Signed)
PCP: Dr. Cato Mulligan  75 yo with history of hyperlipidemia, HTN, and GERD returns for cardiology followup.  BP remains well-controlled, 110/59 today.  No exertional chest pain or shortness of breath. Has had a lot of stress recently: mother-in-law had a prolonged illness and eventually passed away.   He is concerned about the circulation in his right leg as he has been having pain in the right thigh.  This does not seem to be exertional.  ECG: NSR, normal  Labs (1/11): LDL 61, HDL 42, creatinine 1.0 Labs (3/11): creatinine 1.2, K 4 Labs (1/12): LDL 100, HDL 50  Allergies (verified):  No Known Drug Allergies  Past Medical History: 1. Nephrolithiasis, hx of 2. Peptic ulcer disease with upper GI bleed in 2009---led to transfusion 3. Hyperlipidemia 4. GERD 5. transient ischemic attack - 2009 6. hypertension  7. C-spine osteoarthritis 8. Chest pain: ETT-myoview (2/11) pt exercised 4', stopped due to fatigue but had bilateral shoulder pain, BP increased to 206/60.  1 mm ST depression in inferolateral leads.  Nuclear images with no ischemia or infarction. Unable to do coronary CTA as unable to beta block HR below 70.  ETT-echo was then done, showing hypertenstive BP response and no evidence for ischemia or infarction.  Suspect shoulder pain was not ischemic.   Family History: Father: father died colon ca age 47, DM II Mother: died complications appendicitis age 63 Siblings: 1 brother alive BCCE, 1 sisiter died age 17 lupus Family History of Colon CA 1st degree relative <60 Family History Diabetes 1st degree relative No premature CAD  Social History: Retired Married Former Smoker Alcohol use-yes Regular exercise-no 3 children 1 son with OSA, obese, 1 son healthy, 1 daughter breast ca age 56  Current Outpatient Prescriptions  Medication Sig Dispense Refill  . aspirin 81 MG tablet Take 81 mg by mouth daily.        Marland Kitchen lisinopril-hydrochlorothiazide (PRINZIDE,ZESTORETIC) 20-12.5 MG per tablet  Take 1 tablet by mouth daily.        . nitroGLYCERIN (NITROSTAT) 0.4 MG SL tablet Place 0.4 mg under the tongue every 5 (five) minutes as needed.        . pantoprazole (PROTONIX) 40 MG tablet Take 40 mg by mouth daily.          Blood pressure 110/59, pulse 85, height 5\' 7"  (1.702 m), weight 136 lb (61.689 kg). s General: NAD Neck: No JVD, no thyromegaly or thyroid nodule.  Lungs: Clear to auscultation bilaterally with normal respiratory effort. CV: Nondisplaced PMI.  Heart regular S1/S2, no S3/S4, no murmur.  No peripheral edema.  No carotid bruit.  Unable to palpate PT pulse on right.  Bilateral 2+ DPs.  Abdomen: Soft, nontender, no hepatosplenomegaly, no distention.  Neurologic: Alert and oriented x 3.  Psych: Normal affect. Extremities: No clubbing or cyanosis

## 2011-03-08 ENCOUNTER — Encounter (INDEPENDENT_AMBULATORY_CARE_PROVIDER_SITE_OTHER): Payer: Medicare Other | Admitting: *Deleted

## 2011-03-08 DIAGNOSIS — I739 Peripheral vascular disease, unspecified: Secondary | ICD-10-CM

## 2011-03-11 ENCOUNTER — Encounter: Payer: Self-pay | Admitting: Cardiology

## 2011-04-08 ENCOUNTER — Other Ambulatory Visit: Payer: Self-pay | Admitting: Cardiology

## 2011-04-08 IMAGING — NM NM HEPATO W/GB/PHARM/[PERSON_NAME]
2 series · 12 of 12 positions shown · non-contrast
Comparison: none

CLINICAL DATA: Nausea, right upper quadrant pain.  Negative
ultrasound.

[he hepatobiliary · 3.43mm/px · 6 of 45 frames shown (1 of 2)]
[frame 4/45]
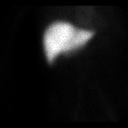
[frame 12/45]
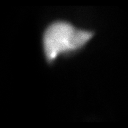
[frame 19/45]
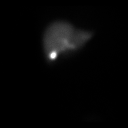
[frame 27/45]
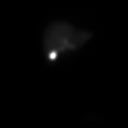
[frame 34/45]
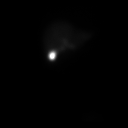
[frame 42/45]
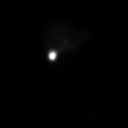

[he hepatobiliary · 3.43mm/px · 6 of 30 frames shown (2 of 2)]
[frame 3/30]
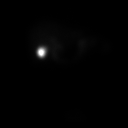
[frame 8/30]
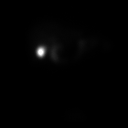
[frame 13/30]
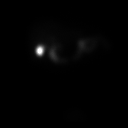
[frame 18/30]
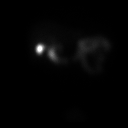
[frame 23/30]
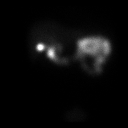
[frame 28/30]
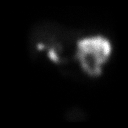

[12 of 12 positions shown; findings below may reference images not displayed]

HEPATOBILIARY SCINTIGRAPHY WITH EJECTION FRACTION

Anterior imaging afterV.Um8i 2c337 Choletec IV. There is prompt
clearance of the radiopharmaceutical from the blood pool. Timely
visualization of activity in central bile ducts, small bowel, and
gallbladder.
After 1 hour,1.23 mcg CCK was infused intravenously and imaging
continued. No symptoms during infusion. The calculated gallbladder
minutes (Ziessman et al., 0225 J. Nuclear Med).

IMPRESSION
1. Patency of cystic and common bile ducts.
2. Normal gallbladder ejection fraction.

## 2011-04-10 MED ORDER — LISINOPRIL-HYDROCHLOROTHIAZIDE 20-12.5 MG PO TABS: 1.0000 | ORAL_TABLET | Freq: Every day | ORAL | Status: DC

## 2011-05-14 ENCOUNTER — Encounter: Payer: Self-pay | Admitting: Internal Medicine

## 2011-05-14 ENCOUNTER — Ambulatory Visit (INDEPENDENT_AMBULATORY_CARE_PROVIDER_SITE_OTHER): Payer: Medicare Other | Admitting: Internal Medicine

## 2011-05-14 VITALS — BP 102/66 | HR 76 | Temp 97.7°F | Ht 67.0 in | Wt 134.0 lb

## 2011-05-14 DIAGNOSIS — R7309 Other abnormal glucose: Secondary | ICD-10-CM

## 2011-05-14 DIAGNOSIS — Z23 Encounter for immunization: Secondary | ICD-10-CM

## 2011-05-14 DIAGNOSIS — E785 Hyperlipidemia, unspecified: Secondary | ICD-10-CM

## 2011-05-14 DIAGNOSIS — I10 Essential (primary) hypertension: Secondary | ICD-10-CM

## 2011-05-20 LAB — DIFFERENTIAL
Basophils Absolute: 0
Basophils Relative: 0
Eosinophils Absolute: 0
Eosinophils Relative: 0
Lymphocytes Relative: 14
Lymphs Abs: 1.2
Monocytes Absolute: 0.3
Monocytes Relative: 4
Neutro Abs: 7.2
Neutrophils Relative %: 82 — ABNORMAL HIGH

## 2011-05-20 LAB — CK TOTAL AND CKMB (NOT AT ARMC)
CK, MB: 8.4 — ABNORMAL HIGH
Relative Index: 2.2
Total CK: 381 — ABNORMAL HIGH

## 2011-05-20 LAB — CBC
HCT: 43.9
Hemoglobin: 15.2
MCHC: 34.5
MCV: 95.9
Platelets: 206
RBC: 4.58
RDW: 12.9
WBC: 8.8

## 2011-05-20 LAB — COMPREHENSIVE METABOLIC PANEL
ALT: 22
AST: 28
Albumin: 4.5
Alkaline Phosphatase: 54
BUN: 15
CO2: 25
Calcium: 10.2
Chloride: 105
Creatinine, Ser: 0.97
GFR calc Af Amer: >60
GFR calc non Af Amer: >60
Glucose, Bld: 135 — ABNORMAL HIGH
Potassium: 4.1
Sodium: 138
Total Bilirubin: 0.8
Total Protein: 7

## 2011-05-20 LAB — PROTIME-INR
INR: 0.9
Prothrombin Time: 12.5

## 2011-05-20 LAB — APTT: aPTT: 29

## 2011-05-20 LAB — TROPONIN I: Troponin I: 0.01

## 2011-05-20 LAB — SEDIMENTATION RATE: Sed Rate: 5

## 2011-05-22 NOTE — Assessment & Plan Note (Signed)
BP Readings from Last 3 Encounters:  05/14/11 102/66  02/21/11 110/59  01/11/11 140/70   Well controlled Continue meds

## 2011-05-22 NOTE — Assessment & Plan Note (Signed)
Has resolved with weight loss Will remove from problem list

## 2011-05-22 NOTE — Progress Notes (Signed)
  Subjective:    Patient ID: [name], male    DOB: [birth year], 75 y.o.   MRN: [id number]  HPI  Patient Active Problem List  Diagnoses  . HYPERLIPIDEMIA---currently no meds  . HYPERTENSION, UNSPECIFIED---no home bps, tolerating meds  . GERD---no sxs on meds  .   .   . HYPERGLYCEMIA---needs f/u  . NEPHROLITHIASIS, HX OF  .     Past Medical History  Diagnosis Date  . BENIGN PROSTATIC HYPERTROPHY, WITH OBSTRUCTION 01/15/2010  . GERD 02/22/2008  . HYPERGLYCEMIA 11/18/2007  . HYPERLIPIDEMIA 02/22/2008  . HYPERTENSION, UNSPECIFIED 11/10/2009  . NEPHROLITHIASIS, HX OF 11/17/2007  . PEPTIC ULCER DISEASE 11/17/2007  . TIA (transient ischemic attack)   . Arthritis     osteo - c-spine  . Transfusion history     bleeding ulcer   Past Surgical History  Procedure Date  . Fetal blood transfusion     from bleeding ulcer    reports that he quit smoking about 32 years ago. He has never used smokeless tobacco. He reports that he drinks alcohol. He reports that he does not use illicit drugs. family history includes Appendicitis in his mother; Colon cancer in his father and other; Diabetes type II in his father; and Lupus in his sister. No Known Allergies  Review of Systems  patient denies chest pain, shortness of breath, orthopnea. Denies lower extremity edema, abdominal pain, change in appetite, change in bowel movements. Patient denies rashes, musculoskeletal complaints. No other specific complaints in a complete review of systems.      Objective:   Physical Exam  well-developed well-nourished male in no acute distress. HEENT exam atraumatic, normocephalic, neck supple without jugular venous distention. Chest clear to auscultation cardiac exam S1-S2 are regular. Abdominal exam overweight with bowel sounds, soft and nontender. Extremities no edema. Neurologic exam is alert with a normal gait.        Assessment & Plan:

## 2011-05-22 NOTE — Assessment & Plan Note (Signed)
Resolved with weight loss.   

## 2011-06-04 ENCOUNTER — Telehealth: Payer: Self-pay | Admitting: Internal Medicine

## 2011-06-04 DIAGNOSIS — R110 Nausea: Secondary | ICD-10-CM

## 2011-06-04 NOTE — Telephone Encounter (Signed)
Pt called and is req to get a GI referral to Dr Asencion Partridge [facility] office. Pt contacted Dr Aurora Mask office and was told that this would require referral from pts pcp and also would need to get a copy of pts medical records sent over to Dr [facility] office, prior to getting the referral.

## 2011-06-06 NOTE — Telephone Encounter (Signed)
LMTCB

## 2011-06-06 NOTE — Telephone Encounter (Signed)
Pt needs a referral for chronic nausea.  Referral order placed, pt aware

## 2011-08-27 HISTORY — PX: CATARACT EXTRACTION W/ INTRAOCULAR LENS  IMPLANT, BILATERAL: SHX1307

## 2011-10-22 DIAGNOSIS — D485 Neoplasm of uncertain behavior of skin: Secondary | ICD-10-CM | POA: Diagnosis not present

## 2011-10-22 DIAGNOSIS — D235 Other benign neoplasm of skin of trunk: Secondary | ICD-10-CM | POA: Diagnosis not present

## 2011-10-22 DIAGNOSIS — L570 Actinic keratosis: Secondary | ICD-10-CM | POA: Diagnosis not present

## 2011-11-11 ENCOUNTER — Ambulatory Visit (INDEPENDENT_AMBULATORY_CARE_PROVIDER_SITE_OTHER): Payer: Medicare Other | Admitting: Internal Medicine

## 2011-11-11 ENCOUNTER — Encounter: Payer: Self-pay | Admitting: Internal Medicine

## 2011-11-11 VITALS — BP 110/64 | Temp 97.8°F | Wt 144.0 lb

## 2011-11-11 DIAGNOSIS — E785 Hyperlipidemia, unspecified: Secondary | ICD-10-CM

## 2011-11-11 DIAGNOSIS — I10 Essential (primary) hypertension: Secondary | ICD-10-CM | POA: Diagnosis not present

## 2011-11-11 DIAGNOSIS — K219 Gastro-esophageal reflux disease without esophagitis: Secondary | ICD-10-CM | POA: Diagnosis not present

## 2011-11-11 LAB — BASIC METABOLIC PANEL
BUN: 17 mg/dL (ref 6–23)
CO2: 28 mEq/L (ref 19–32)
Calcium: 9.6 mg/dL (ref 8.4–10.5)
Chloride: 103 mEq/L (ref 96–112)
Creatinine, Ser: 1 mg/dL (ref 0.4–1.5)
GFR: 77.63 mL/min (ref >60.00)
Glucose, Bld: 82 mg/dL (ref 70–99)
Potassium: 4.3 mEq/L (ref 3.5–5.1)
Sodium: 139 mEq/L (ref 135–145)

## 2011-11-11 LAB — LIPID PANEL
Cholesterol: 181 mg/dL (ref 0–200)
HDL: 67.3 mg/dL (ref >39.00)
LDL Cholesterol: 103 mg/dL — ABNORMAL HIGH (ref 0–99)
Total CHOL/HDL Ratio: 3
Triglycerides: 55 mg/dL (ref 0.0–149.0)
VLDL: 11 mg/dL (ref 0.0–40.0)

## 2011-11-11 MED ORDER — LISINOPRIL-HYDROCHLOROTHIAZIDE 20-12.5 MG PO TABS: 0.5000 | ORAL_TABLET | Freq: Every day | ORAL | Status: DC

## 2011-11-11 NOTE — Progress Notes (Signed)
Patient ID: [name], male   DOB: 02-25-1936, 76 y.o.   MRN: [id number] htn---tolerating meds without difficulty  GERD--well controlled on PPI  Lipids---off simvastatin.   Past Medical History  Diagnosis Date  . BENIGN PROSTATIC HYPERTROPHY, WITH OBSTRUCTION 01/15/2010  . GERD 02/22/2008  . HYPERGLYCEMIA 11/18/2007  . HYPERLIPIDEMIA 02/22/2008  . HYPERTENSION, UNSPECIFIED 11/10/2009  . NEPHROLITHIASIS, HX OF 11/17/2007  . PEPTIC ULCER DISEASE 11/17/2007  . TIA (transient ischemic attack)   . Arthritis     osteo - c-spine  . Transfusion history     bleeding ulcer    History   Social History  . Marital Status: Married    Spouse Name: N/A    Number of Children: N/A  . Years of Education: N/A   Occupational History  . Not on file.   Social History Main Topics  . Smoking status: Former Smoker    Quit date: 08/26/1978  . Smokeless tobacco: Never Used  . Alcohol Use: Yes     rarley  . Drug Use: No  . Sexually Active: Not on file   Other Topics Concern  . Not on file   Social History Narrative   Retired and married.     Past Surgical History  Procedure Date  . Fetal blood transfusion     from bleeding ulcer    Family History  Problem Relation Age of Onset  . Appendicitis Mother     complications  . Colon cancer Father   . Diabetes type II Father   . Lupus Sister   . Colon cancer Other     1st degree relative <60    No Known Allergies  Current Outpatient Prescriptions on File Prior to Visit  Medication Sig Dispense Refill  . aspirin 81 MG tablet Take 81 mg by mouth daily.        . fish oil-omega-3 fatty acids 1000 MG capsule Take 2 g by mouth daily.        Marland Kitchen lisinopril-hydrochlorothiazide (PRINZIDE,ZESTORETIC) 20-12.5 MG per tablet Take 1 tablet by mouth daily.  30 tablet  6  . nitroGLYCERIN (NITROSTAT) 0.4 MG SL tablet Place 0.4 mg under the tongue every 5 (five) minutes as needed.        Marland Kitchen omeprazole (PRILOSEC) 20 MG capsule Take 20 mg by mouth daily.            patient denies chest pain, shortness of breath, orthopnea. Denies lower extremity edema, abdominal pain, change in appetite, change in bowel movements. Patient denies rashes, musculoskeletal complaints. No other specific complaints in a complete review of systems.   BP 110/64  Temp(Src) 97.8 F (36.6 C) (Oral)  Wt 144 lb (65.318 kg) Well-developed male in no acute distress. HEENT exam atraumatic, normocephalic, extraocular muscles are intact. Conjunctivae are pink without exudate. Neck is supple without lymphadenopathy, thyromegaly, jugular venous distention. Chest is clear to auscultation without increased work of breathing. Cardiac exam S1-S2 are regular.

## 2011-11-11 NOTE — Assessment & Plan Note (Signed)
Well controlled on 1/2 of lisinopril/hctz 20/12.5 Continue same dose

## 2011-11-11 NOTE — Assessment & Plan Note (Signed)
Well controlled off meds 

## 2011-11-25 ENCOUNTER — Telehealth: Payer: Self-pay | Admitting: Internal Medicine

## 2011-11-25 DIAGNOSIS — I10 Essential (primary) hypertension: Secondary | ICD-10-CM

## 2011-11-25 MED ORDER — LISINOPRIL-HYDROCHLOROTHIAZIDE 20-12.5 MG PO TABS: 0.5000 | ORAL_TABLET | Freq: Every day | ORAL | Status: DC

## 2011-11-25 NOTE — Telephone Encounter (Signed)
Pt called req lab results. Pls call.  °

## 2011-11-25 NOTE — Telephone Encounter (Signed)
Pt given normal lab results, also wanted lisinopril rx sent in to [facility], sent in electronically and mailed lab results

## 2011-12-09 ENCOUNTER — Other Ambulatory Visit: Payer: Self-pay | Admitting: Cardiology

## 2011-12-09 DIAGNOSIS — I10 Essential (primary) hypertension: Secondary | ICD-10-CM

## 2011-12-09 MED ORDER — LISINOPRIL-HYDROCHLOROTHIAZIDE 20-12.5 MG PO TABS: 0.5000 | ORAL_TABLET | Freq: Every day | ORAL | Status: DC

## 2011-12-09 NOTE — Telephone Encounter (Signed)
Pt needs refill sent in 

## 2012-01-29 DIAGNOSIS — L570 Actinic keratosis: Secondary | ICD-10-CM | POA: Diagnosis not present

## 2012-02-20 ENCOUNTER — Ambulatory Visit (INDEPENDENT_AMBULATORY_CARE_PROVIDER_SITE_OTHER): Payer: Medicare Other | Admitting: Cardiology

## 2012-02-20 ENCOUNTER — Encounter: Payer: Self-pay | Admitting: Cardiology

## 2012-02-20 VITALS — BP 124/66 | HR 68 | Ht 67.0 in | Wt 143.0 lb

## 2012-02-20 DIAGNOSIS — E785 Hyperlipidemia, unspecified: Secondary | ICD-10-CM

## 2012-02-20 DIAGNOSIS — I10 Essential (primary) hypertension: Secondary | ICD-10-CM

## 2012-02-20 DIAGNOSIS — R079 Chest pain, unspecified: Secondary | ICD-10-CM

## 2012-02-20 NOTE — Patient Instructions (Addendum)
Your physician wants you to follow-up in: 1 year with Dr McLean. You will receive a reminder letter in the mail two months in advance. If you don't receive a letter, please call our office to schedule the follow-up appointment.  

## 2012-02-21 DIAGNOSIS — R079 Chest pain, unspecified: Secondary | ICD-10-CM | POA: Insufficient documentation

## 2012-02-21 DIAGNOSIS — E785 Hyperlipidemia, unspecified: Secondary | ICD-10-CM | POA: Insufficient documentation

## 2012-02-21 NOTE — Assessment & Plan Note (Signed)
No further chest pain.  Continue ASA 81 mg daily.

## 2012-02-21 NOTE — Assessment & Plan Note (Signed)
Lipids look good off simvastatin.  Continue to watch diet closely.

## 2012-02-21 NOTE — Progress Notes (Signed)
Patient ID: [name], male   DOB: [birth year], 76 y.o.   MRN: [id number] PCP: Dr. Cato Mulligan  76 yo with history of hyperlipidemia, HTN, and GERD returns for cardiology followup.  BP remains well-controlled.  No exertional chest pain or shortness of breath. He is not on a statin now as he had some GI side effects from simvastatin.  Lipids looked ok in 3/13.  He is walking 2 miles/day with his wife.   ECG: NSR, early repolarization  Labs (1/11): LDL 61, HDL 42, creatinine 1.0 Labs (3/11): creatinine 1.2, K 4 Labs (1/12): LDL 100, HDL 50 Labs (3/13): K 4.3, creatinine 1.0, LDL 103, HDL 67  Allergies (verified):  No Known Drug Allergies  Past Medical History: 1. Nephrolithiasis, hx of 2. Peptic ulcer disease with upper GI bleed in 2009---led to transfusion 3. Hyperlipidemia 4. GERD 5. transient ischemic attack - 2009 6. hypertension  7. C-spine osteoarthritis 8. Chest pain: ETT-myoview (2/11) pt exercised 4', stopped due to fatigue but had bilateral shoulder pain, BP increased to 206/60.  1 mm ST depression in inferolateral leads.  Nuclear images with no ischemia or infarction. Unable to do coronary CTA as unable to beta block HR below 70.  ETT-echo was then done, showing hypertenstive BP response and no evidence for ischemia or infarction.  Suspect shoulder pain was not ischemic.  9. Leg pain: ABIs normal in 7/12.   Family History: Father: father died colon ca age 31, DM II Mother: died complications appendicitis age 35 Siblings: 1 brother alive BCCE, 1 sisiter died age 42 lupus Family History of Colon CA 1st degree relative <60 Family History Diabetes 1st degree relative No premature CAD  Social History: Retired Married Former Smoker Alcohol use-yes Regular exercise-no 3 children 1 son with OSA, obese, 1 son healthy, 1 daughter breast ca age 78  Current Outpatient Prescriptions  Medication Sig Dispense Refill  . aspirin 81 MG tablet Take 81 mg by mouth daily.        . fish  oil-omega-3 fatty acids 1000 MG capsule Take 1 g by mouth daily.       Marland Kitchen lisinopril-hydrochlorothiazide (PRINZIDE,ZESTORETIC) 20-12.5 MG per tablet Take 0.5 tablets by mouth daily.  90 tablet  0  . MULTIPLE VITAMIN PO Take by mouth daily.      . nitroGLYCERIN (NITROSTAT) 0.4 MG SL tablet Place 0.4 mg under the tongue every 5 (five) minutes as needed.        Marland Kitchen DISCONTD: omeprazole (PRILOSEC) 20 MG capsule Take 20 mg by mouth daily.          Blood pressure 124/66, pulse 68, height 5\' 7"  (1.702 m), weight 64.864 kg (143 lb).  General: NAD Neck: No JVD, no thyromegaly or thyroid nodule.  Lungs: Clear to auscultation bilaterally with normal respiratory effort. CV: Nondisplaced PMI.  Heart regular S1/S2, no S3/S4, no murmur.  No peripheral edema.  No carotid bruit.  Bilateral 2+ DPs.  Abdomen: Soft, nontender, no hepatosplenomegaly, no distention.  Neurologic: Alert and oriented x 3.  Psych: Normal affect. Extremities: No clubbing or cyanosis

## 2012-02-21 NOTE — Assessment & Plan Note (Signed)
BP controlled on current regimen.

## 2012-02-26 NOTE — Addendum Note (Signed)
Addended by: Early Chars on: 02/26/2012 09:56 AM   Modules accepted: Orders

## 2012-08-25 ENCOUNTER — Encounter: Payer: Self-pay | Admitting: Family Medicine

## 2012-08-25 ENCOUNTER — Ambulatory Visit (INDEPENDENT_AMBULATORY_CARE_PROVIDER_SITE_OTHER): Payer: Medicare Other | Admitting: Family Medicine

## 2012-08-25 VITALS — BP 118/70 | HR 79 | Temp 97.7°F | Wt 150.0 lb

## 2012-08-25 DIAGNOSIS — T148XXA Other injury of unspecified body region, initial encounter: Secondary | ICD-10-CM | POA: Diagnosis not present

## 2012-08-25 DIAGNOSIS — Z23 Encounter for immunization: Secondary | ICD-10-CM | POA: Diagnosis not present

## 2012-08-25 NOTE — Progress Notes (Signed)
Chief Complaint  Patient presents with  . Fall    x 2 weeks ago; soreness and pain in mid back and lowr back and on right side by ribs     HPI:  Acute visit for rib pain s/p fall: -fell 2 weeks ago - was carrying crate into basement and fell on carpeted stairs -fell backwards and hit low and mid back on the R -hurts some initially - but not too back, but then pain increased over the next week -pain is in lower rib cage area R mid back and R side with certain movements and can be worse at times -breathing is fine, no pain with breathing -concerned about rib fx and wonders if needs xray -1 tylenol taken a few times and helps -did not have much bruising when this happened  ROS: See pertinent positives and negatives per HPI.  Past Medical History  Diagnosis Date  . BENIGN PROSTATIC HYPERTROPHY, WITH OBSTRUCTION 01/15/2010  . GERD 02/22/2008  . HYPERGLYCEMIA 11/18/2007  . HYPERLIPIDEMIA 02/22/2008  . HYPERTENSION, UNSPECIFIED 11/10/2009  . NEPHROLITHIASIS, HX OF 11/17/2007  . PEPTIC ULCER DISEASE 11/17/2007  . TIA (transient ischemic attack)   . Arthritis     osteo - c-spine  . Transfusion history     bleeding ulcer    Family History  Problem Relation Age of Onset  . Appendicitis Mother     complications  . Colon cancer Father   . Diabetes type II Father   . Lupus Sister   . Colon cancer Other     1st degree relative <60    History   Social History  . Marital Status: Married    Spouse Name: N/A    Number of Children: N/A  . Years of Education: N/A   Social History Main Topics  . Smoking status: Former Smoker    Quit date: 08/26/1978  . Smokeless tobacco: Never Used  . Alcohol Use: Yes     Comment: rarley  . Drug Use: No  . Sexually Active: None   Other Topics Concern  . None   Social History Narrative   Retired and married.     Current outpatient prescriptions:aspirin 81 MG tablet, Take 81 mg by mouth daily.  , Disp: , Rfl: ;  fish oil-omega-3 fatty acids  1000 MG capsule, Take 1 g by mouth daily. , Disp: , Rfl: ;  lisinopril-hydrochlorothiazide (PRINZIDE,ZESTORETIC) 20-12.5 MG per tablet, Take 0.5 tablets by mouth daily., Disp: 90 tablet, Rfl: 0;  MULTIPLE VITAMIN PO, Take by mouth daily., Disp: , Rfl:  nitroGLYCERIN (NITROSTAT) 0.4 MG SL tablet, Place 0.4 mg under the tongue every 5 (five) minutes as needed.  , Disp: , Rfl: ;  [DISCONTINUED] omeprazole (PRILOSEC) 20 MG capsule, Take 20 mg by mouth daily.  , Disp: , Rfl:   EXAM:  Filed Vitals:   08/25/12 1007  BP: 118/70  Pulse: 79  Temp: 97.7 F (36.5 C)    There is no height on file to calculate BMI.  GENERAL: vitals reviewed and listed above, alert, oriented, appears well hydrated and in no acute distress  HEENT: atraumatic, conjunttiva clear, no obvious abnormalities on inspection of external nose and ears  MS: moves all extremities without noticeable abnormality -TTP in R thoracic paraspinal muscles that reproduce his pain, NO bony spinal, scapular or rib TTP -no winging scapula -no ecchymosis or swelling  PSYCH: pleasant and cooperative, no obvious depression or anxiety  ASSESSMENT AND PLAN:  Discussed the following assessment and plan:  1. Muscle strain    -Patient advised to return or notify a doctor immediately if symptoms worsen or persist or new concerns arise.  Patient Instructions  Muscle Strain: -tylenol if needed according to instructions  -heat for 15 minutes 2 x daily  -keep moving with gentle activity  -follow up in 3-4 weeks with your doctor,      Kriste Basque R.

## 2012-08-25 NOTE — Patient Instructions (Signed)
Muscle Strain: -tylenol if needed according to instructions  -heat for 15 minutes 2 x daily  -keep moving with gentle activity  -follow up in 3-4 weeks with your doctor,

## 2012-09-02 ENCOUNTER — Other Ambulatory Visit: Payer: Self-pay | Admitting: *Deleted

## 2012-09-02 DIAGNOSIS — I10 Essential (primary) hypertension: Secondary | ICD-10-CM

## 2012-09-02 MED ORDER — LISINOPRIL-HYDROCHLOROTHIAZIDE 20-12.5 MG PO TABS: 0.5000 | ORAL_TABLET | Freq: Every day | ORAL | Status: DC

## 2012-09-15 ENCOUNTER — Ambulatory Visit: Payer: Medicare Other | Admitting: Internal Medicine

## 2012-09-18 DIAGNOSIS — H2510 Age-related nuclear cataract, unspecified eye: Secondary | ICD-10-CM | POA: Diagnosis not present

## 2012-10-22 DIAGNOSIS — H2589 Other age-related cataract: Secondary | ICD-10-CM | POA: Diagnosis not present

## 2012-10-22 DIAGNOSIS — H2510 Age-related nuclear cataract, unspecified eye: Secondary | ICD-10-CM | POA: Diagnosis not present

## 2012-11-12 DIAGNOSIS — H2589 Other age-related cataract: Secondary | ICD-10-CM | POA: Diagnosis not present

## 2012-11-12 DIAGNOSIS — H2510 Age-related nuclear cataract, unspecified eye: Secondary | ICD-10-CM | POA: Diagnosis not present

## 2012-11-18 ENCOUNTER — Ambulatory Visit (INDEPENDENT_AMBULATORY_CARE_PROVIDER_SITE_OTHER): Payer: Medicare Other | Admitting: Internal Medicine

## 2012-11-18 ENCOUNTER — Encounter: Payer: Self-pay | Admitting: Internal Medicine

## 2012-11-18 VITALS — BP 150/70 | HR 78 | Temp 97.8°F | Ht 67.5 in | Wt 149.0 lb

## 2012-11-18 DIAGNOSIS — E785 Hyperlipidemia, unspecified: Secondary | ICD-10-CM | POA: Diagnosis not present

## 2012-11-18 DIAGNOSIS — I10 Essential (primary) hypertension: Secondary | ICD-10-CM | POA: Diagnosis not present

## 2012-11-18 DIAGNOSIS — K219 Gastro-esophageal reflux disease without esophagitis: Secondary | ICD-10-CM | POA: Diagnosis not present

## 2012-11-18 DIAGNOSIS — D649 Anemia, unspecified: Secondary | ICD-10-CM

## 2012-11-18 LAB — FERRITIN: Ferritin: 99 ng/mL (ref 22.0–322.0)

## 2012-11-18 LAB — CBC WITH DIFFERENTIAL/PLATELET
Basophils Absolute: 0 10*3/uL (ref 0.0–0.1)
Basophils Relative: 0.3 % (ref 0.0–3.0)
Eosinophils Absolute: 0 10*3/uL (ref 0.0–0.7)
Eosinophils Relative: 0.7 % (ref 0.0–5.0)
HCT: 41 % (ref 39.0–52.0)
Hemoglobin: 13.6 g/dL (ref 13.0–17.0)
Lymphocytes Relative: 18.5 % (ref 12.0–46.0)
Lymphs Abs: 1.3 10*3/uL (ref 0.7–4.0)
MCHC: 33.2 g/dL (ref 30.0–36.0)
MCV: 98.2 fl (ref 78.0–100.0)
Monocytes Absolute: 0.5 10*3/uL (ref 0.1–1.0)
Monocytes Relative: 7.3 % (ref 3.0–12.0)
Neutro Abs: 5.1 10*3/uL (ref 1.4–7.7)
Neutrophils Relative %: 73.2 % (ref 43.0–77.0)
Platelets: 198 10*3/uL (ref 150.0–400.0)
RBC: 4.17 Mil/uL — ABNORMAL LOW (ref 4.22–5.81)
RDW: 13.7 % (ref 11.5–14.6)
WBC: 7 10*3/uL (ref 4.5–10.5)

## 2012-11-18 LAB — LIPID PANEL
Cholesterol: 171 mg/dL (ref 0–200)
HDL: 56.7 mg/dL (ref >39.00)
LDL Cholesterol: 99 mg/dL (ref 0–99)
Total CHOL/HDL Ratio: 3
Triglycerides: 77 mg/dL (ref 0.0–149.0)
VLDL: 15.4 mg/dL (ref 0.0–40.0)

## 2012-11-18 LAB — BASIC METABOLIC PANEL
BUN: 17 mg/dL (ref 6–23)
CO2: 27 mEq/L (ref 19–32)
Calcium: 10.1 mg/dL (ref 8.4–10.5)
Chloride: 100 mEq/L (ref 96–112)
Creatinine, Ser: 1.1 mg/dL (ref 0.4–1.5)
GFR: 68.63 mL/min (ref >60.00)
Glucose, Bld: 93 mg/dL (ref 70–99)
Potassium: 4.2 mEq/L (ref 3.5–5.1)
Sodium: 139 mEq/L (ref 135–145)

## 2012-11-18 LAB — TSH: TSH: 3.85 u[IU]/mL (ref 0.35–5.50)

## 2012-11-19 NOTE — Progress Notes (Signed)
Patient ID: [name], male   DOB: 1936/06/17, 77 y.o.   MRN: [id number] Patient comes in for evaluation. Patient has known benign prostatic hypertrophy with obstruction. His symptoms are reasonably well controlled. He has nocturia x2.  Patient had gastroesophageal reflux disease which is well-controlled on no medications at this time.  Patient has hypertension and is tolerating medications without difficulty.  Hyperlipidemia currently on no medications.  I have reviewed past medical history, family history, surgical history.  Review of systems patient denies any specific complaints in a complete review of systems.  Physical exam: Thin male in no acute distress. HEENT exam atraumatic, normocephalic symmetric her muscles are intact. Neck is supple without lymphadenopathy or thyromegaly. Chest clear to auscultation cardiac exam S1-S2 are regular. Abdominal exam active bowel sounds, soft and nontender. Muscles: Exam he does appear somewhat kyphotic.

## 2012-11-19 NOTE — Assessment & Plan Note (Signed)
Currently not on any medications. I'll recheck lipid panel.

## 2012-11-19 NOTE — Assessment & Plan Note (Signed)
BP Readings from Last 3 Encounters:  11/18/12 150/70  08/25/12 118/70  02/20/12 124/66   Not as well-controlled as usual. I've asked and monitor his blood pressure at home., His blood pressure is consistently above 140/90.

## 2012-11-19 NOTE — Assessment & Plan Note (Signed)
No symptoms and currently not on any medications.

## 2013-02-17 DIAGNOSIS — B351 Tinea unguium: Secondary | ICD-10-CM | POA: Diagnosis not present

## 2013-02-17 DIAGNOSIS — D235 Other benign neoplasm of skin of trunk: Secondary | ICD-10-CM | POA: Diagnosis not present

## 2013-02-17 DIAGNOSIS — L570 Actinic keratosis: Secondary | ICD-10-CM | POA: Diagnosis not present

## 2013-05-17 DIAGNOSIS — H43819 Vitreous degeneration, unspecified eye: Secondary | ICD-10-CM | POA: Diagnosis not present

## 2013-05-25 ENCOUNTER — Other Ambulatory Visit: Payer: Self-pay | Admitting: Cardiology

## 2013-05-31 DIAGNOSIS — H43819 Vitreous degeneration, unspecified eye: Secondary | ICD-10-CM | POA: Diagnosis not present

## 2013-06-02 ENCOUNTER — Other Ambulatory Visit: Payer: Self-pay | Admitting: *Deleted

## 2013-06-02 DIAGNOSIS — I10 Essential (primary) hypertension: Secondary | ICD-10-CM

## 2013-06-02 MED ORDER — LISINOPRIL-HYDROCHLOROTHIAZIDE 20-12.5 MG PO TABS: 0.5000 | ORAL_TABLET | Freq: Every day | ORAL | Status: DC

## 2013-06-03 DIAGNOSIS — Z23 Encounter for immunization: Secondary | ICD-10-CM | POA: Diagnosis not present

## 2013-06-22 DIAGNOSIS — H43819 Vitreous degeneration, unspecified eye: Secondary | ICD-10-CM | POA: Diagnosis not present

## 2013-06-28 ENCOUNTER — Ambulatory Visit (HOSPITAL_COMMUNITY): Payer: Medicare Other | Attending: Nurse Practitioner

## 2013-06-28 ENCOUNTER — Ambulatory Visit (INDEPENDENT_AMBULATORY_CARE_PROVIDER_SITE_OTHER): Payer: Medicare Other | Admitting: Nurse Practitioner

## 2013-06-28 ENCOUNTER — Encounter: Payer: Self-pay | Admitting: Nurse Practitioner

## 2013-06-28 VITALS — BP 150/74 | HR 82 | Ht 67.0 in | Wt 145.8 lb

## 2013-06-28 DIAGNOSIS — R209 Unspecified disturbances of skin sensation: Secondary | ICD-10-CM | POA: Diagnosis not present

## 2013-06-28 DIAGNOSIS — R0989 Other specified symptoms and signs involving the circulatory and respiratory systems: Secondary | ICD-10-CM

## 2013-06-28 DIAGNOSIS — I10 Essential (primary) hypertension: Secondary | ICD-10-CM | POA: Diagnosis not present

## 2013-06-28 DIAGNOSIS — I6529 Occlusion and stenosis of unspecified carotid artery: Secondary | ICD-10-CM | POA: Diagnosis not present

## 2013-06-28 DIAGNOSIS — E785 Hyperlipidemia, unspecified: Secondary | ICD-10-CM | POA: Insufficient documentation

## 2013-06-28 DIAGNOSIS — Z87891 Personal history of nicotine dependence: Secondary | ICD-10-CM | POA: Insufficient documentation

## 2013-06-28 DIAGNOSIS — Z8673 Personal history of transient ischemic attack (TIA), and cerebral infarction without residual deficits: Secondary | ICD-10-CM | POA: Insufficient documentation

## 2013-06-28 DIAGNOSIS — H53129 Transient visual loss, unspecified eye: Secondary | ICD-10-CM | POA: Insufficient documentation

## 2013-06-28 MED ORDER — LISINOPRIL-HYDROCHLOROTHIAZIDE 20-12.5 MG PO TABS: 0.5000 | ORAL_TABLET | Freq: Every day | ORAL | Status: DC

## 2013-06-28 NOTE — Patient Instructions (Signed)
We need to check a doppler on your carotids  Monitor your blood pressure at home - let us know if your readings are staying above 150 consistently - go back to keeping your BP diary  See Dr. Shirlee Latch in a year  Call the [facility] office at [phone] if you have any questions, problems or concerns.

## 2013-06-28 NOTE — Progress Notes (Signed)
[name] Date of Birth: October 15, 1935 Medical Record #[id number]  History of Present Illness: Mr. Soltys is seen back today for a follow up visit. Seen for Dr. Shirlee Latch. He has HLD, HTN and GERD. Had TIA back in 2009. Negative Myoview for ischemia/infarction in 2011 but had poor exercise tolerance, bilateral shoulder pain and 1 mm ST depression - this was followed by an ETT-echo - showing hypertensive BP response.   Last seen here in June of 2013.   Comes back today. Here alone. Needs medicine refilled. Feels ok. No chest pain. Not short of breath. Enjoys traveling - [place] in [place] 2 months last spring. Has had his cataracts done - now with some floaters - no other neuro symptoms. Has stopped checking his BP at home.   Current Outpatient Prescriptions  Medication Sig Dispense Refill  . aspirin 81 MG tablet Take 81 mg by mouth daily.        . fish oil-omega-3 fatty acids 1000 MG capsule Take 1 g by mouth daily.       Marland Kitchen lisinopril-hydrochlorothiazide (PRINZIDE,ZESTORETIC) 20-12.5 MG per tablet Take 0.5 tablets by mouth daily.  45 tablet  0  . MULTIPLE VITAMIN PO Take by mouth daily.      . nitroGLYCERIN (NITROSTAT) 0.4 MG SL tablet Place 0.4 mg under the tongue every 5 (five) minutes as needed.        . [DISCONTINUED] omeprazole (PRILOSEC) 20 MG capsule Take 20 mg by mouth daily.         No current facility-administered medications for this visit.    No Known Allergies  Past Medical History:  1. Nephrolithiasis, hx of  2. Peptic ulcer disease with upper GI bleed in 2009---led to transfusion  3. Hyperlipidemia  4. GERD  5. transient ischemic attack - 2009  6. hypertension  7. C-spine osteoarthritis  8. Chest pain: ETT-myoview (2/11) pt exercised 4', stopped due to fatigue but had bilateral shoulder pain, BP increased to 206/60. 1 mm ST depression in inferolateral leads. Nuclear images with no ischemia or infarction. Unable to do coronary CTA as unable to beta block HR below  70. ETT-echo was then done, showing hypertenstive BP response and no evidence for ischemia or infarction. Suspect shoulder pain was not ischemic.  9. Leg pain: ABIs normal in 7/12.    Past Medical History  Diagnosis Date  . BENIGN PROSTATIC HYPERTROPHY, WITH OBSTRUCTION 01/15/2010  . GERD 02/22/2008  . HYPERGLYCEMIA 11/18/2007  . HYPERLIPIDEMIA 02/22/2008  . HYPERTENSION, UNSPECIFIED 11/10/2009  . NEPHROLITHIASIS, HX OF 11/17/2007  . PEPTIC ULCER DISEASE 11/17/2007  . TIA (transient ischemic attack)   . Arthritis     osteo - c-spine  . Transfusion history     bleeding ulcer    Past Surgical History  Procedure Laterality Date  . Fetal blood transfusion      from bleeding ulcer  . Cataract extraction, bilateral  2013    History  Smoking status  . Former Smoker  . Quit date: 08/26/1978  Smokeless tobacco  . Never Used    History  Alcohol Use  . Yes    Comment: rarley    Family History  Problem Relation Age of Onset  . Appendicitis Mother     complications  . Colon cancer Father   . Diabetes type II Father   . Lupus Sister   . Colon cancer Other     1st degree relative <60    Review of Systems: The review of  systems is per the HPI.  All other systems were reviewed and are negative.  Physical Exam: BP 150/74  Pulse 82  Ht 5\' 7"  (1.702 m)  Wt 145 lb 12.8 oz (66.134 kg)  BMI 22.83 kg/m2  SpO2 96% Patient is very pleasant and in no acute distress. Skin is warm and dry. Color is normal.  HEENT is unremarkable but has left carotid bruit. Normocephalic/atraumatic. PERRL. Sclera are nonicteric. Neck is supple. No masses. No JVD. Lungs are clear. Cardiac exam shows a regular rate and rhythm. Abdomen is soft. Extremities are without edema. Gait and ROM are intact. No gross neurologic deficits noted.  LABORATORY DATA: Lab Results  Component Value Date   WBC 7.0 11/18/2012   HGB 13.6 11/18/2012   HCT 41.0 11/18/2012   PLT 198.0 11/18/2012   GLUCOSE 93 11/18/2012   CHOL 171  11/18/2012   TRIG 77.0 11/18/2012   HDL 56.70 11/18/2012   LDLCALC 99 11/18/2012   ALT 16 09/12/2010   AST 18 09/12/2010   NA 139 11/18/2012   K 4.2 11/18/2012   CL 100 11/18/2012   CREATININE 1.1 11/18/2012   BUN 17 11/18/2012   CO2 27 11/18/2012   TSH 3.85 11/18/2012   PSA 0.38 01/15/2010   INR 0.9 11/03/2007   HGBA1C  Value: 5.6 (NOTE)                                                                       According to the ADA Clinical Practice Recommendations for 2011, when HbA1c is used as a screening test:   >=6.5%   Diagnostic of Diabetes Mellitus           (if abnormal result  is confirmed)  5.7-6.4%   Increased risk of developing Diabetes Mellitus  References:Diagnosis and Classification of Diabetes Mellitus,Diabetes Care,2011,34(Suppl 1):S62-S69 and Standards of Medical Care in         Diabetes - 2011,Diabetes Care,2011,34  (Suppl 1):S11-S61. 09/12/2010     Assessment / Plan: 1. HTN - BP by me is 170/70 - he says he has some degree of white coat syndrome - not really wanting to increase his medicines back - but agreeable to checking his BP at home - going back to keeping a BP diary - he will call us if he has consistent readings above 150 so we can adjust his medicines  2. Left carotid bruit - complaints of floaters - will check doppler  Tentatively see Dr. Shirlee Latch in a year. Labs are with Dr. Cato Mulligan.   Patient is agreeable to this plan and will call if any problems develop in the interim.   Rosalio Macadamia, RN, ANP-C [facility] [address] [place], [place]  [address]

## 2013-06-29 ENCOUNTER — Other Ambulatory Visit: Payer: Self-pay

## 2013-06-29 DIAGNOSIS — I6523 Occlusion and stenosis of bilateral carotid arteries: Secondary | ICD-10-CM

## 2013-07-01 ENCOUNTER — Other Ambulatory Visit: Payer: Self-pay

## 2013-07-09 ENCOUNTER — Encounter (HOSPITAL_COMMUNITY): Payer: Medicare Other

## 2013-07-20 DIAGNOSIS — H43819 Vitreous degeneration, unspecified eye: Secondary | ICD-10-CM | POA: Diagnosis not present

## 2013-09-14 DIAGNOSIS — Z1211 Encounter for screening for malignant neoplasm of colon: Secondary | ICD-10-CM | POA: Diagnosis not present

## 2013-09-14 DIAGNOSIS — K449 Diaphragmatic hernia without obstruction or gangrene: Secondary | ICD-10-CM | POA: Diagnosis not present

## 2013-09-14 DIAGNOSIS — K3189 Other diseases of stomach and duodenum: Secondary | ICD-10-CM | POA: Diagnosis not present

## 2013-09-14 DIAGNOSIS — Z800 Family history of malignant neoplasm of digestive organs: Secondary | ICD-10-CM | POA: Diagnosis not present

## 2013-09-14 DIAGNOSIS — K219 Gastro-esophageal reflux disease without esophagitis: Secondary | ICD-10-CM | POA: Diagnosis not present

## 2013-09-26 ENCOUNTER — Emergency Department (INDEPENDENT_AMBULATORY_CARE_PROVIDER_SITE_OTHER)
Admission: EM | Admit: 2013-09-26 | Discharge: 2013-09-26 | Disposition: A | Discharge status: Home / Self Care | Payer: Medicare Other | Source: Home / Self Care | Attending: Emergency Medicine | Admitting: Emergency Medicine

## 2013-09-26 ENCOUNTER — Emergency Department (INDEPENDENT_AMBULATORY_CARE_PROVIDER_SITE_OTHER): Payer: Medicare Other

## 2013-09-26 ENCOUNTER — Encounter (HOSPITAL_COMMUNITY): Payer: Self-pay | Admitting: Emergency Medicine

## 2013-09-26 DIAGNOSIS — J111 Influenza due to unidentified influenza virus with other respiratory manifestations: Secondary | ICD-10-CM

## 2013-09-26 DIAGNOSIS — R69 Illness, unspecified: Principal | ICD-10-CM

## 2013-09-26 DIAGNOSIS — J438 Other emphysema: Secondary | ICD-10-CM | POA: Diagnosis not present

## 2013-09-26 LAB — POCT URINALYSIS DIP (DEVICE)
Bilirubin Urine: NEGATIVE
Glucose, UA: NEGATIVE mg/dL
Ketones, ur: NEGATIVE mg/dL
Leukocytes, UA: NEGATIVE
Nitrite: NEGATIVE
Protein, ur: NEGATIVE mg/dL
Specific Gravity, Urine: 1.01 (ref 1.005–1.030)
Urobilinogen, UA: 0.2 mg/dL (ref 0.0–1.0)
pH: 6 (ref 5.0–8.0)

## 2013-09-26 LAB — POCT I-STAT, CHEM 8
BUN: 22 mg/dL (ref 6–23)
Calcium, Ion: 1.2 mmol/L (ref 1.13–1.30)
Chloride: 97 mEq/L (ref 96–112)
Creatinine, Ser: 1.2 mg/dL (ref 0.50–1.35)
Glucose, Bld: 118 mg/dL — ABNORMAL HIGH (ref 70–99)
HCT: 43 % (ref 39.0–52.0)
Hemoglobin: 14.6 g/dL (ref 13.0–17.0)
Potassium: 4 mEq/L (ref 3.7–5.3)
Sodium: 133 mEq/L — ABNORMAL LOW (ref 137–147)
TCO2: 26 mmol/L (ref 0–100)

## 2013-09-26 MED ORDER — ACETAMINOPHEN 325 MG PO TABS
650.0000 mg | ORAL_TABLET | Freq: Once | ORAL | Status: AC
  Administered 2013-09-26: 650 mg via ORAL

## 2013-09-26 MED ORDER — OSELTAMIVIR PHOSPHATE 75 MG PO CAPS: 75.0000 mg | ORAL_CAPSULE | Freq: Two times a day (BID) | ORAL | Status: DC

## 2013-09-26 MED ORDER — GUAIFENESIN-CODEINE 100-10 MG/5ML PO SYRP: 10.0000 mL | ORAL_SOLUTION | Freq: Four times a day (QID) | ORAL | Status: DC | PRN

## 2013-09-26 MED ORDER — ONDANSETRON 8 MG PO TBDP: 8.0000 mg | ORAL_TABLET | Freq: Three times a day (TID) | ORAL | Status: DC | PRN

## 2013-09-26 MED ORDER — ACETAMINOPHEN 325 MG PO TABS
ORAL_TABLET | ORAL | Status: AC
  Filled 2013-09-26: qty 2

## 2013-09-26 NOTE — ED Notes (Signed)
PT   REPORTS     SYMPTOMS  OF   FEVER  BODY  ACHES     CHILLS         SOME  NAUSEA          AND  DIARRHEA  AS  WELL  PT  REPORTS  THE  SYMPTOMS  BEGAN ABOUT  4  DAYS  AGO           HE  HAS  BEEN  WEAK  AS  AS  WELL

## 2013-09-26 NOTE — ED Provider Notes (Signed)
Chief Complaint   Chief Complaint  Patient presents with  . Fever    History of Present Illness   [name] is a 78 year old gentleman who has had a four-day history of nasal congestion, rhinorrhea, weakness, chills, fever of up to 103.1, nausea, diarrhea, frequent urination, cough productive yellow sputum, chest tightness, and sore throat. He denies any wheezing, difficulty breathing, vomiting, abdominal pain, dysuria, or hematuria. He has had no specific sick exposures.  Review of Systems   Other than as noted above, the patient denies any of the following symptoms: Systemic:  No fevers, chills, sweats, or myalgias. Eye:  No redness or discharge. ENT:  No ear pain, headache, nasal congestion, drainage, sinus pressure, or sore throat. Neck:  No neck pain, stiffness, or swollen glands. Lungs:  No cough, sputum production, hemoptysis, wheezing, chest tightness, shortness of breath or chest pain. GI:  No abdominal pain, nausea, vomiting or diarrhea.  [place]   Past medical history, family history, social history, meds, and allergies were reviewed. He has no medication allergies. He takes lisinopril and Pepcid for high blood pressure and gastroesophageal reflux respectively.  Physical exam   Vital signs:  BP 146/62  Pulse 107  Temp(Src) 100.4 F (38 C) (Oral)  Resp 21  SpO2 99% General:  Alert and oriented.  In no distress.  Skin warm and dry. Eye:  No conjunctival injection or drainage. Lids were normal. ENT:  TMs and canals were normal, without erythema or inflammation.  Nasal mucosa was clear and uncongested, without drainage.  Mucous membranes were moist.  Pharynx was clear with no exudate or drainage.  There were no oral ulcerations or lesions. Neck:  Supple, no adenopathy, tenderness or mass. Lungs:  No respiratory distress.  Lungs were clear to auscultation, without wheezes, rales or rhonchi.  Breath sounds were clear and equal bilaterally.  Heart:  Regular rhythm,  without gallops, murmers or rubs. Skin:  Clear, warm, and dry, without rash or lesions.   Labs   Results for orders placed during the hospital encounter of 09/26/13  POCT URINALYSIS DIP (DEVICE)      Result Value Range   Glucose, UA NEGATIVE  NEGATIVE mg/dL   Bilirubin Urine NEGATIVE  NEGATIVE   Ketones, ur NEGATIVE  NEGATIVE mg/dL   Specific Gravity, Urine 1.010  1.005 - 1.030   Hgb urine dipstick SMALL (*) NEGATIVE   pH 6.0  5.0 - 8.0   Protein, ur NEGATIVE  NEGATIVE mg/dL   Urobilinogen, UA 0.2  0.0 - 1.0 mg/dL   Nitrite NEGATIVE  NEGATIVE   Leukocytes, UA NEGATIVE  NEGATIVE  POCT I-STAT, CHEM 8      Result Value Range   Sodium 133 (*) 137 - 147 mEq/L   Potassium 4.0  3.7 - 5.3 mEq/L   Chloride 97  96 - 112 mEq/L   BUN 22  6 - 23 mg/dL   Creatinine, Ser 1.20  0.50 - 1.35 mg/dL   Glucose, Bld 118 (*) 70 - 99 mg/dL   Calcium, Ion 1.20  1.13 - 1.30 mmol/L   TCO2 26  0 - 100 mmol/L   Hemoglobin 14.6  13.0 - 17.0 g/dL   HCT 43.0  39.0 - 52.0 %   The urine was cultured.  Radiology   Dg Chest 2 View  09/26/2013   CLINICAL DATA:  Cough, fever, weakness, hypertension, former smoker  EXAM: CHEST  2 VIEW  COMPARISON:  [facility] hundred [age] 2012  FINDINGS: Upper normal heart size.  Mediastinal  contours and pulmonary vascularity normal.  Emphysematous changes without infiltrate, pleural effusion or pneumothorax.  Calcification of the anterior longitudinal ligament of the thoracic spine.  Minimal scattered endplate spur formation.  IMPRESSION: COPD changes.  No acute abnormalities.   Electronically Signed   By: Lavonia Dana M.D.   On: 09/26/2013 15:21    Course in [facility] [place]   Was given Tylenol 650 mg by mouth and his temperature came down from 101.5 to 100.4.  Assessment     The encounter diagnosis was Influenza-like illness.  He is at somewhat high risk, but there is no indication for hospitalization at this time. He will need to be rechecked in 48 hours. I impressed upon  him and his wife the importance of returning immediately if he should feel like he was getting any worse.  Plan    1.  Meds:  The following meds were prescribed:   Discharge Medication List as of 09/26/2013  3:37 PM    START taking these medications   Details  guaiFENesin-codeine (GUIATUSS AC) 100-10 MG/5ML syrup Take 10 mLs by mouth 4 (four) times daily as needed for cough., Starting 09/26/2013, Until Discontinued, Print    ondansetron (ZOFRAN ODT) 8 MG disintegrating tablet Take 1 tablet (8 mg total) by mouth every 8 (eight) hours as needed for nausea., Starting 09/26/2013, Until Discontinued, Normal    oseltamivir (TAMIFLU) 75 MG capsule Take 1 capsule (75 mg total) by mouth every 12 (twelve) hours., Starting 09/26/2013, Until Discontinued, Normal        2.  Patient Education/Counseling:  The patient was given appropriate handouts, self care instructions, and instructed in symptomatic relief.  Instructed to get extra fluids, rest, and use a cool mist vaporizer.    3.  Follow up:  The patient was told to follow up here if no better in 3 to 4 days, or sooner if becoming worse in any way, and given some red flag symptoms such as increasing fever, difficulty breathing, chest pain, or persistent vomiting which would prompt immediate return.  Follow up with his primary care physician in 48 hours or here if he can't get an appointment.      Harden Mo, MD 09/26/13 [phone]

## 2013-09-26 NOTE — Discharge Instructions (Signed)
Most upper respiratory infections are caused by viruses and do not require antibiotics.  We try to save the antibiotics for when we really need them to prevent bacteria from developing resistance to them.  Here are a few hints about things that can be done at home to help get over an upper respiratory infection quicker: ° °Get extra sleep and extra fluids.  Get 7 to 9 hours of sleep per night and 6 to 8 glasses of water a day.  Getting extra sleep keeps the immune system from getting run down.  Most people with an upper respiratory infection are a little dehydrated.  The extra fluids also keep the secretions liquified and easier to deal with.  Also, get extra vitamin C.  4000 mg per day is the recommended dose. °For the aches, headache, and fever, acetaminophen or ibuprofen are helpful.  These can be alternated every 4 hours.  People with liver disease should avoid large amounts of acetaminophen, and people with ulcer disease, gastroesophageal reflux, gastritis, congestive heart failure, chronic kidney disease, coronary artery disease and the elderly should avoid ibuprofen. °For nasal congestion try Mucinex-D, or if you're having lots of sneezing or clear nasal drainage use Zyrtec-D. People with high blood pressure can take these if their blood pressure is controlled, if not, it's best to avoid the forms with a "D" (decongestants).  You can use the plain Mucinex, Allegra, Claritin, or Zyrtec even if your blood pressure is not controlled.   °A Saline nasal spray such as Ocean Spray can also help.  You can add a decongestant sprays such as Afrin, but you should not use the decongestant sprays for more than 3 or 4 days since they can be habituating.  Breathe Rite nasal strips can also offer a non-drug alternative treatment to nasal congestion, especially at night. °For people with symptoms of sinusitis, sleeping with your head elevated can be helpful.  For sinus pain, moist, hot compresses to the face may provide some  relief.  Many people find that inhaling steam as in a shower or from a pot of steaming water can help. °For any viral infection, zinc containing lozenges such as Cold-Eze or Zicam are helpful.  Zinc helps to fight viral infection.  Hot salt water gargles (8 oz of hot water, 1/2 tsp of table salt, and a pinch of baking soda) can give relief as well as hot beverages such as hot tea.  Sucrets extra strength lozenges will help the sore throat.  °For the cough, take Delsym 2 tsp every 12 hours.  It has also been found recently that Aleve can help control a cough.  The dose is 1 to 2 tablets twice daily with food.  This can be combined with Delsym. (Note, if you are taking ibuprofen, you should not take Aleve as well--take one or the other.) °A cool mist vaporizer will help keep your mucous membranes from drying out.  ° °It's important when you have an upper respiratory infection not to pass the infection to others.  This involves being very careful about the following: ° °Frequent hand washing or use of hand sanitizer, especially after coughing, sneezing, blowing your nose or touching your face, nose or eyes. °Do not shake hands or touch anyone and try to avoid touching surfaces that other people use such as doorknobs, shopping carts, telephones and computer keyboards. °Use tissues and dispose of them properly in a garbage can or ziplock bag. °Cough into your sleeve. °Do not let others eat or   drink after you. ° °It's also important to recognize the signs of serious illness and get evaluated if they occur: °Any respiratory infection that lasts more than 7 to 10 days.  Yellow nasal drainage and sputum are not reliable indicators of a bacterial infection, but if they last for more than 1 week, see your doctor. °Fever and sore throat can indicate strep. °Fever and cough can indicate influenza or pneumonia. °Any kind of severe symptom such as difficulty breathing, intractable vomiting, or severe pain should prompt you to see  a doctor as soon as possible. ° ° °Your body's immune system is really the thing that will get rid of this infection.  Your immune system is comprised of 2 types of specialized cells called T cells and B cells.  T cells coordinate the array of cells in your body that engulf invading bacteria or viruses while B cells orchestrate the production of antibodies that neutralize infection.  Anything we do or any medications we give you, will just strengthen your immune system or help it clear up the infection quicker.  Here are a few helpful hints to improve your immune system to help overcome this illness or to prevent future infections: °· A few vitamins can improve the health of your immune system.  That's why your diet should include plenty of fruits, vegetables, fish, nuts, and whole grains. °· Vitamin A and bet-carotene can increase the cells that fight infections (T cells and B cells).  Vitamin A is abundant in dark greens and orange vegetables such as spinach, greens, sweet potatoes, and carrots. °· Vitamin B6 contributes to the maturation of white blood cells, the cells that fight disease.  Foods with vitamin B6 include cold cereal and bananas. °· Vitamin C is credited with preventing colds because it increases white blood cells and also prevents cellular damage.  Citrus fruits, peaches and green and red bell peppers are all hight in vitamin C. °· Vitamin E is an anti-oxidant that encourages the production of natural killer cells which reject foreign invaders and B cells that produce antibodies.  Foods high in vitamin E include wheat germ, nuts and seeds. °· Foods high in omega-3 fatty acids found in foods like salmon, tuna and mackerel boost your immune system and help cells to engulf and absorb germs. °· Probiotics are good bacteria that increase your T cells.  These can be found in yogurt and are available in supplements such as Culturelle or Align. °· Moderate exercise increases the strength of your immune  system and your ability to recover from illness.  I suggest 3 to 5 moderate intensity 30 minute workouts per week.   °· Sleep is another component of maintaining a strong immune system.  It enables your body to recuperate from the day's activities, stress and work.  My recommendation is to get between 7 and 9 hours of sleep per night. °· If you smoke, try to quit completely or at least cut down.  Drink alcohol only in moderation if at all.  No more than 2 drinks daily for men or 1 for women. °· Get a flu vaccine early in the fall or if you have not gotten one yet, once this illness has run its course.  If you are over 65, a smoker, or an asthmatic, get a pneumococcal vaccine. °· My final recommendation is to maintain a healthy weight.  Excess weight can impair the immune system by interfering with the way the immune system deals with invading viruses or   bacteria. ° ° ° °Influenza, Adult °Influenza ("the flu") is a viral infection of the respiratory tract. It occurs more often in winter months because people spend more time in close contact with one another. Influenza can make you feel very sick. Influenza easily spreads from person to person (contagious). °CAUSES  °Influenza is caused by a virus that infects the respiratory tract. You can catch the virus by breathing in droplets from an infected person's cough or sneeze. You can also catch the virus by touching something that was recently contaminated with the virus and then touching your mouth, nose, or eyes. °SYMPTOMS  °Symptoms typically last 4 to 10 days and may include: °· Fever. °· Chills. °· Headache, body aches, and muscle aches. °· Sore throat. °· Chest discomfort and cough. °· Poor appetite. °· Weakness or feeling tired. °· Dizziness. °· Nausea or vomiting. °DIAGNOSIS  °Diagnosis of influenza is often made based on your history and a physical exam. A nose or throat swab test can be done to confirm the diagnosis. °RISKS AND COMPLICATIONS °You may be at risk  for a more severe case of influenza if you smoke cigarettes, have diabetes, have chronic heart disease (such as heart failure) or lung disease (such as asthma), or if you have a weakened immune system. Elderly people and pregnant women are also at risk for more serious infections. The most common complication of influenza is a lung infection (pneumonia). Sometimes, this complication can require emergency medical care and may be life-threatening. °PREVENTION  °An annual influenza vaccination (flu shot) is the best way to avoid getting influenza. An annual flu shot is now routinely recommended for all adults in the [place] °TREATMENT  °In mild cases, influenza goes away on its own. Treatment is directed at relieving symptoms. For more severe cases, your caregiver may prescribe antiviral medicines to shorten the sickness. Antibiotic medicines are not effective, because the infection is caused by a virus, not by bacteria. °HOME CARE INSTRUCTIONS °· Only take over-the-counter or prescription medicines for pain, discomfort, or fever as directed by your caregiver. °· Use a cool mist humidifier to make breathing easier. °· Get plenty of rest until your temperature returns to normal. This usually takes 3 to 4 days. °· Drink enough fluids to keep your urine clear or pale yellow. °· Cover your mouth and nose when coughing or sneezing, and wash your hands well to avoid spreading the virus. °· Stay home from work or school until your fever has been gone for at least 1 full day. °SEEK MEDICAL CARE IF:  °· You have chest pain or a deep cough that worsens or produces more mucus. °· You have nausea, vomiting, or diarrhea. °SEEK IMMEDIATE MEDICAL CARE IF:  °· You have difficulty breathing, shortness of breath, or your skin or nails turn bluish. °· You have severe neck pain or stiffness. °· You have a severe headache, facial pain, or earache. °· You have a worsening or recurring fever. °· You have nausea or vomiting that cannot be  controlled. °MAKE SURE YOU: °· Understand these instructions. °· Will watch your condition. °· Will get help right away if you are not doing well or get worse. °Document Released: 08/09/2000 Document Revised: 02/11/2012 Document Reviewed: 11/11/2011 °[employer]® Patient Information ©2014 [employer]. ° °

## 2013-09-27 LAB — URINE CULTURE
Colony Count: NO GROWTH
Culture: NO GROWTH
Special Requests: NORMAL

## 2013-09-28 ENCOUNTER — Encounter: Payer: Self-pay | Admitting: Internal Medicine

## 2013-09-28 ENCOUNTER — Ambulatory Visit (INDEPENDENT_AMBULATORY_CARE_PROVIDER_SITE_OTHER): Payer: Medicare Other | Admitting: Internal Medicine

## 2013-09-28 VITALS — BP 126/60 | HR 75 | Temp 97.8°F | Resp 20 | Ht 67.0 in | Wt 148.0 lb

## 2013-09-28 DIAGNOSIS — I10 Essential (primary) hypertension: Secondary | ICD-10-CM

## 2013-09-28 DIAGNOSIS — E785 Hyperlipidemia, unspecified: Secondary | ICD-10-CM | POA: Diagnosis not present

## 2013-09-28 DIAGNOSIS — J069 Acute upper respiratory infection, unspecified: Secondary | ICD-10-CM | POA: Diagnosis not present

## 2013-09-28 DIAGNOSIS — B9789 Other viral agents as the cause of diseases classified elsewhere: Secondary | ICD-10-CM

## 2013-09-28 NOTE — Progress Notes (Signed)
Pre-visit discussion using our clinic review tool. No additional management support is needed unless otherwise documented below in the visit note.  

## 2013-09-28 NOTE — Progress Notes (Signed)
Subjective:    Patient ID: [name], male    DOB: [birth year], 78 y.o.   MRN: [id number]  HPI 78 year old patient who has a history of hypertension and dyslipidemia. He was seen 2 days ago in the urgent care do to an acute febrile illness with productive cough. He was treated for a flulike illness with Tamiflu and has had a very nice clinical response over the past 2 days. His cough has improved he feels better and is now afebrile.  Urgent care records reviewed. This included labs and a chest x-ray  Past Medical History  Diagnosis Date  . BENIGN PROSTATIC HYPERTROPHY, WITH OBSTRUCTION 01/15/2010  . GERD 02/22/2008  . HYPERGLYCEMIA 11/18/2007  . HYPERLIPIDEMIA 02/22/2008  . HYPERTENSION, UNSPECIFIED 11/10/2009  . NEPHROLITHIASIS, HX OF 11/17/2007  . PEPTIC ULCER DISEASE 11/17/2007  . TIA (transient ischemic attack)   . Arthritis     osteo - c-spine  . Transfusion history     bleeding ulcer    History   Social History  . Marital Status: Married    Spouse Name: N/A    Number of Children: N/A  . Years of Education: N/A   Occupational History  . Not on file.   Social History Main Topics  . Smoking status: Former Smoker    Quit date: 08/26/1978  . Smokeless tobacco: Never Used  . Alcohol Use: Yes     Comment: rarley  . Drug Use: No  . Sexual Activity: Yes   Other Topics Concern  . Not on file   Social History Narrative   Retired and married.     Past Surgical History  Procedure Laterality Date  . Fetal blood transfusion      from bleeding ulcer  . Cataract extraction, bilateral  2013    Family History  Problem Relation Age of Onset  . Appendicitis Mother     complications  . Colon cancer Father   . Diabetes type II Father   . Lupus Sister   . Colon cancer Other     1st degree relative <60    No Known Allergies  Current Outpatient Prescriptions on File Prior to Visit  Medication Sig Dispense Refill  . aspirin 81 MG tablet Take 81 mg by mouth daily.         . fish oil-omega-3 fatty acids 1000 MG capsule Take 1 g by mouth daily.       Marland Kitchen guaiFENesin-codeine (GUIATUSS AC) 100-10 MG/5ML syrup Take 10 mLs by mouth 4 (four) times daily as needed for cough.  120 mL  0  . lisinopril-hydrochlorothiazide (PRINZIDE,ZESTORETIC) 20-12.5 MG per tablet Take 0.5 tablets by mouth daily.  45 tablet  3  . MULTIPLE VITAMIN PO Take by mouth daily.      . nitroGLYCERIN (NITROSTAT) 0.4 MG SL tablet Place 0.4 mg under the tongue every 5 (five) minutes as needed.        . ondansetron (ZOFRAN ODT) 8 MG disintegrating tablet Take 1 tablet (8 mg total) by mouth every 8 (eight) hours as needed for nausea.  20 tablet  0  . oseltamivir (TAMIFLU) 75 MG capsule Take 1 capsule (75 mg total) by mouth every 12 (twelve) hours.  10 capsule  0  . [DISCONTINUED] omeprazole (PRILOSEC) 20 MG capsule Take 20 mg by mouth daily.         No current facility-administered medications on file prior to visit.    BP 126/60  Pulse 75  Temp(Src) 97.8 F (36.6 C) (Oral)  Resp 20  Ht 5\' 7"  (1.702 m)  Wt 148 lb (67.132 kg)  BMI 23.17 kg/m2  SpO2 98%         Review of Systems  Constitutional: Positive for fever, activity change, appetite change and fatigue. Negative for chills.  HENT: Negative for congestion, dental problem, ear pain, hearing loss, sore throat, tinnitus, trouble swallowing and voice change.   Eyes: Negative for pain, discharge and visual disturbance.  Respiratory: Positive for cough. Negative for chest tightness, wheezing and stridor.   Cardiovascular: Negative for chest pain, palpitations and leg swelling.  Gastrointestinal: Negative for nausea, vomiting, abdominal pain, diarrhea, constipation, blood in stool and abdominal distention.  Genitourinary: Negative for urgency, hematuria, flank pain, discharge, difficulty urinating and genital sores.  Musculoskeletal: Negative for arthralgias, back pain, gait problem, joint swelling, myalgias and neck stiffness.  Skin:  Negative for rash.  Neurological: Negative for dizziness, syncope, speech difficulty, weakness, numbness and headaches.  Hematological: Negative for adenopathy. Does not bruise/bleed easily.  Psychiatric/Behavioral: Negative for behavioral problems and dysphoric mood. The patient is not nervous/anxious.        Objective:   Physical Exam  Constitutional: He is oriented to person, place, and time. He appears well-developed and well-nourished. No distress.  No distress Afebrile No paroxysms of coughing  HENT:  Head: Normocephalic.  Right Ear: External ear normal.  Left Ear: External ear normal.  Eyes: Conjunctivae and EOM are normal.  Neck: Normal range of motion.  Cardiovascular: Normal rate, regular rhythm and normal heart sounds.   Pulmonary/Chest: Breath sounds normal. No respiratory distress. He has no wheezes. He has no rales.  O2 saturation 98%  Abdominal: Bowel sounds are normal.  Musculoskeletal: Normal range of motion. He exhibits no edema and no tenderness.  Neurological: He is alert and oriented to person, place, and time.  Psychiatric: He has a normal mood and affect. His behavior is normal.          Assessment & Plan:  Status post acute febrile illness. Patient is much improved and now afebrile. Cough has largely resolved. He will continue Tamiflu until completed. He does have a annual exam scheduled with his PCP next month. He has been urged to keep this appointment Hypertension well controlled History of dyslipidemia. This be rechecked next month. CHD risk is 20%. We'll consider statin therapy

## 2013-09-28 NOTE — Patient Instructions (Signed)
Acute bronchitis symptoms for less than 10 days are generally not helped by antibiotics.  Take over-the-counter expectorants and cough medications such as  Mucinex DM.  Call if there is no improvement in 5 to 7 days or if he developed worsening cough, fever, or new symptoms, such as shortness of breath or chest pain.  Office followup next month as scheduled  Call or return to clinic prn if these symptoms worsen or fail to improve as anticipated.

## 2013-09-29 ENCOUNTER — Telehealth: Payer: Self-pay | Admitting: Internal Medicine

## 2013-09-29 NOTE — Telephone Encounter (Signed)
Relevant patient education assigned to patient using Emmi. ° °

## 2013-10-04 DIAGNOSIS — B351 Tinea unguium: Secondary | ICD-10-CM | POA: Diagnosis not present

## 2013-11-01 DIAGNOSIS — H52209 Unspecified astigmatism, unspecified eye: Secondary | ICD-10-CM | POA: Diagnosis not present

## 2013-11-01 DIAGNOSIS — B351 Tinea unguium: Secondary | ICD-10-CM | POA: Diagnosis not present

## 2013-11-01 DIAGNOSIS — H43819 Vitreous degeneration, unspecified eye: Secondary | ICD-10-CM | POA: Diagnosis not present

## 2013-11-01 DIAGNOSIS — Z961 Presence of intraocular lens: Secondary | ICD-10-CM | POA: Diagnosis not present

## 2013-12-06 DIAGNOSIS — K219 Gastro-esophageal reflux disease without esophagitis: Secondary | ICD-10-CM | POA: Diagnosis not present

## 2014-01-03 DIAGNOSIS — B351 Tinea unguium: Secondary | ICD-10-CM | POA: Diagnosis not present

## 2014-01-31 DIAGNOSIS — M47814 Spondylosis without myelopathy or radiculopathy, thoracic region: Secondary | ICD-10-CM | POA: Diagnosis not present

## 2014-01-31 DIAGNOSIS — M546 Pain in thoracic spine: Secondary | ICD-10-CM | POA: Diagnosis not present

## 2014-01-31 DIAGNOSIS — M5030 Other cervical disc degeneration, unspecified cervical region: Secondary | ICD-10-CM | POA: Diagnosis not present

## 2014-01-31 DIAGNOSIS — M542 Cervicalgia: Secondary | ICD-10-CM | POA: Diagnosis not present

## 2014-03-21 ENCOUNTER — Telehealth: Payer: Self-pay | Admitting: Internal Medicine

## 2014-03-21 NOTE — Telephone Encounter (Signed)
Pt scheduled to come in for follow up with pcp on 7/29, requesting to have labs drawn prior to appt if possible.  If not he will wait until appt to get labs.  Pt states she usually gets a bmet.  He also wants to check liver since he was prescribed a medicine from dermatologist for toenail fungas that can effect the liver.  Please advise if orders can be placed today for labs or if pt should wait until appt tomorrow to have labs drawn.

## 2014-03-22 NOTE — Telephone Encounter (Signed)
Pt has already eaten today, he will come in fasting tomorrow

## 2014-03-23 ENCOUNTER — Ambulatory Visit (INDEPENDENT_AMBULATORY_CARE_PROVIDER_SITE_OTHER): Payer: Medicare Other | Admitting: Internal Medicine

## 2014-03-23 ENCOUNTER — Encounter: Payer: Self-pay | Admitting: Internal Medicine

## 2014-03-23 VITALS — BP 154/62 | Temp 98.4°F | Ht 67.0 in | Wt 140.0 lb

## 2014-03-23 DIAGNOSIS — R5381 Other malaise: Secondary | ICD-10-CM | POA: Diagnosis not present

## 2014-03-23 DIAGNOSIS — R110 Nausea: Secondary | ICD-10-CM

## 2014-03-23 DIAGNOSIS — R5383 Other fatigue: Secondary | ICD-10-CM | POA: Diagnosis not present

## 2014-03-23 LAB — POCT URINALYSIS DIPSTICK
Bilirubin, UA: NEGATIVE
Glucose, UA: NEGATIVE
Ketones, UA: NEGATIVE
Leukocytes, UA: NEGATIVE
Nitrite, UA: NEGATIVE
Protein, UA: NEGATIVE
Spec Grav, UA: <=1.005
Urobilinogen, UA: 0.2
pH, UA: 6

## 2014-03-23 LAB — HEPATIC FUNCTION PANEL
ALT: 22 U/L (ref 0–53)
AST: 18 U/L (ref 0–37)
Albumin: 4.7 g/dL (ref 3.5–5.2)
Alkaline Phosphatase: 53 U/L (ref 39–117)
Bilirubin, Direct: 0.3 mg/dL (ref 0.0–0.3)
Total Bilirubin: 1.7 mg/dL — ABNORMAL HIGH (ref 0.2–1.2)
Total Protein: 7.1 g/dL (ref 6.0–8.3)

## 2014-03-23 LAB — CBC WITH DIFFERENTIAL/PLATELET
Basophils Absolute: 0 10*3/uL (ref 0.0–0.1)
Basophils Relative: 0.1 % (ref 0.0–3.0)
Eosinophils Absolute: 0 10*3/uL (ref 0.0–0.7)
Eosinophils Relative: 0.2 % (ref 0.0–5.0)
HCT: 39.4 % (ref 39.0–52.0)
Hemoglobin: 13.3 g/dL (ref 13.0–17.0)
Lymphocytes Relative: 10.1 % — ABNORMAL LOW (ref 12.0–46.0)
Lymphs Abs: 0.8 10*3/uL (ref 0.7–4.0)
MCHC: 33.7 g/dL (ref 30.0–36.0)
MCV: 99.5 fl (ref 78.0–100.0)
Monocytes Absolute: 0.5 10*3/uL (ref 0.1–1.0)
Monocytes Relative: 6.8 % (ref 3.0–12.0)
Neutro Abs: 6.2 10*3/uL (ref 1.4–7.7)
Neutrophils Relative %: 82.8 % — ABNORMAL HIGH (ref 43.0–77.0)
Platelets: 249 10*3/uL (ref 150.0–400.0)
RBC: 3.96 Mil/uL — ABNORMAL LOW (ref 4.22–5.81)
RDW: 13.6 % (ref 11.5–15.5)
WBC: 7.5 10*3/uL (ref 4.0–10.5)

## 2014-03-23 LAB — BASIC METABOLIC PANEL
BUN: 13 mg/dL (ref 6–23)
CO2: 29 mEq/L (ref 19–32)
Calcium: 10.2 mg/dL (ref 8.4–10.5)
Chloride: 102 mEq/L (ref 96–112)
Creatinine, Ser: 1 mg/dL (ref 0.4–1.5)
GFR: 78.95 mL/min (ref >60.00)
Glucose, Bld: 99 mg/dL (ref 70–99)
Potassium: 4 mEq/L (ref 3.5–5.1)
Sodium: 139 mEq/L (ref 135–145)

## 2014-03-23 LAB — SEDIMENTATION RATE: Sed Rate: 5 mm/hr (ref 0–22)

## 2014-03-23 LAB — TSH: TSH: 4.29 u[IU]/mL (ref 0.35–4.50)

## 2014-03-23 NOTE — Progress Notes (Signed)
Pre visit review using our clinic review tool, if applicable. No additional management support is needed unless otherwise documented below in the visit note. 

## 2014-03-23 NOTE — Progress Notes (Signed)
Nausea x 6 months associated with decreased appetite. He has been able to maintain weight.  He has been taking lamisil for his toes.   htn- tolerating meds  Has noted some urinary frequency  Past Medical History  Diagnosis Date  . BENIGN PROSTATIC HYPERTROPHY, WITH OBSTRUCTION 01/15/2010  . GERD 02/22/2008  . HYPERGLYCEMIA 11/18/2007  . HYPERLIPIDEMIA 02/22/2008  . HYPERTENSION, UNSPECIFIED 11/10/2009  . NEPHROLITHIASIS, HX OF 11/17/2007  . PEPTIC ULCER DISEASE 11/17/2007  . TIA (transient ischemic attack)   . Arthritis     osteo - c-spine  . Transfusion history     bleeding ulcer    History   Social History  . Marital Status: Married    Spouse Name: N/A    Number of Children: N/A  . Years of Education: N/A   Occupational History  . Not on file.   Social History Main Topics  . Smoking status: Former Smoker    Quit date: 08/26/1978  . Smokeless tobacco: Never Used  . Alcohol Use: Yes     Comment: rarley  . Drug Use: No  . Sexual Activity: Yes   Other Topics Concern  . Not on file   Social History Narrative   Retired and married.     Past Surgical History  Procedure Laterality Date  . Fetal blood transfusion      from bleeding ulcer  . Cataract extraction, bilateral  2013    Family History  Problem Relation Age of Onset  . Appendicitis Mother     complications  . Colon cancer Father   . Diabetes type II Father   . Lupus Sister   . Colon cancer Other     1st degree relative <60    No Known Allergies  Current Outpatient Prescriptions on File Prior to Visit  Medication Sig Dispense Refill  . aspirin 81 MG tablet Take 81 mg by mouth daily.        . fish oil-omega-3 fatty acids 1000 MG capsule Take 1 g by mouth daily.       Marland Kitchen lisinopril-hydrochlorothiazide (PRINZIDE,ZESTORETIC) 20-12.5 MG per tablet Take 0.5 tablets by mouth daily.  45 tablet  3  . MULTIPLE VITAMIN PO Take by mouth daily.      . nitroGLYCERIN (NITROSTAT) 0.4 MG SL tablet Place 0.4 mg  under the tongue every 5 (five) minutes as needed.        Marland Kitchen guaiFENesin-codeine (GUIATUSS AC) 100-10 MG/5ML syrup Take 10 mLs by mouth 4 (four) times daily as needed for cough.  120 mL  0  . ondansetron (ZOFRAN ODT) 8 MG disintegrating tablet Take 1 tablet (8 mg total) by mouth every 8 (eight) hours as needed for nausea.  20 tablet  0  . [DISCONTINUED] omeprazole (PRILOSEC) 20 MG capsule Take 20 mg by mouth daily.         No current facility-administered medications on file prior to visit.     patient denies chest pain, shortness of breath, orthopnea. Denies lower extremity edema, abdominal pain, change in appetite, change in bowel movements. Patient denies rashes, musculoskeletal complaints. No other specific complaints in a complete review of systems.   BP 154/62  Temp(Src) 98.4 F (36.9 C) (Oral)  Ht 5\' 7"  (1.702 m)  Wt 140 lb (63.504 kg)  BMI 21.92 kg/m2  thin  male in no acute distress. HEENT exam atraumatic, normocephalic, neck supple without jugular venous distention. Chest clear to auscultation cardiac exam S1-S2 are regular. Abdominal exam overweight with bowel sounds, soft and  nontender. Extremities no edema. Neurologic exam is alert with a normal gait.    A/p- fatigue with N/V- ? lamisil related- stop lamisil Check labs

## 2014-03-25 ENCOUNTER — Telehealth: Payer: Self-pay | Admitting: Internal Medicine

## 2014-03-25 DIAGNOSIS — R110 Nausea: Secondary | ICD-10-CM

## 2014-03-25 NOTE — Telephone Encounter (Signed)
Pt requesting to transfer from [facility] to Dr. Raliegh Ip, he wants to continue seeing an internist at [facility].

## 2014-03-25 NOTE — Telephone Encounter (Signed)
Pt is eager to get lab results from earlier this week.  Requesting a callback today.  He is taking a long trip tomorrow and needs to know results before driving.  Advised pt to I would send request for call back today.

## 2014-03-25 NOTE — Telephone Encounter (Signed)
Told pt that Dr Leanne Chang has not reviewed labs yet.  He was wondering about the nausea and what he should do.

## 2014-03-28 NOTE — Telephone Encounter (Signed)
Pt will call back later to schedule appt with Dr. Raliegh Ip.

## 2014-03-28 NOTE — Telephone Encounter (Signed)
ok 

## 2014-03-29 NOTE — Telephone Encounter (Signed)
See result note.  

## 2014-05-17 ENCOUNTER — Other Ambulatory Visit: Payer: Self-pay

## 2014-05-17 MED ORDER — LISINOPRIL-HYDROCHLOROTHIAZIDE 20-12.5 MG PO TABS: 0.5000 | ORAL_TABLET | Freq: Every day | ORAL | Status: DC

## 2014-06-01 DIAGNOSIS — Z23 Encounter for immunization: Secondary | ICD-10-CM | POA: Diagnosis not present

## 2014-06-10 ENCOUNTER — Other Ambulatory Visit: Payer: Self-pay

## 2014-06-16 DIAGNOSIS — D225 Melanocytic nevi of trunk: Secondary | ICD-10-CM | POA: Diagnosis not present

## 2014-06-16 DIAGNOSIS — L570 Actinic keratosis: Secondary | ICD-10-CM | POA: Diagnosis not present

## 2014-06-16 DIAGNOSIS — L821 Other seborrheic keratosis: Secondary | ICD-10-CM | POA: Diagnosis not present

## 2014-06-16 DIAGNOSIS — B351 Tinea unguium: Secondary | ICD-10-CM | POA: Diagnosis not present

## 2014-07-06 ENCOUNTER — Other Ambulatory Visit (HOSPITAL_COMMUNITY): Payer: Self-pay | Admitting: *Deleted

## 2014-07-06 DIAGNOSIS — I6523 Occlusion and stenosis of bilateral carotid arteries: Secondary | ICD-10-CM

## 2014-07-27 ENCOUNTER — Ambulatory Visit (HOSPITAL_COMMUNITY): Payer: Medicare Other | Attending: Cardiology | Admitting: *Deleted

## 2014-07-27 ENCOUNTER — Encounter (HOSPITAL_COMMUNITY): Payer: Medicare Other

## 2014-07-27 DIAGNOSIS — I6523 Occlusion and stenosis of bilateral carotid arteries: Secondary | ICD-10-CM | POA: Diagnosis not present

## 2014-07-27 DIAGNOSIS — I6529 Occlusion and stenosis of unspecified carotid artery: Secondary | ICD-10-CM | POA: Insufficient documentation

## 2014-07-27 DIAGNOSIS — Z87891 Personal history of nicotine dependence: Secondary | ICD-10-CM | POA: Insufficient documentation

## 2014-07-27 DIAGNOSIS — Z8673 Personal history of transient ischemic attack (TIA), and cerebral infarction without residual deficits: Secondary | ICD-10-CM | POA: Diagnosis not present

## 2014-07-27 DIAGNOSIS — E785 Hyperlipidemia, unspecified: Secondary | ICD-10-CM | POA: Insufficient documentation

## 2014-07-27 DIAGNOSIS — I10 Essential (primary) hypertension: Secondary | ICD-10-CM | POA: Diagnosis not present

## 2014-07-27 NOTE — Progress Notes (Signed)
Carotid duplex complete 

## 2014-08-03 ENCOUNTER — Encounter: Payer: Self-pay | Admitting: Cardiology

## 2014-08-03 ENCOUNTER — Encounter: Payer: Self-pay | Admitting: *Deleted

## 2014-08-03 ENCOUNTER — Ambulatory Visit (INDEPENDENT_AMBULATORY_CARE_PROVIDER_SITE_OTHER): Payer: Medicare Other | Admitting: Cardiology

## 2014-08-03 VITALS — BP 124/70 | HR 78 | Ht 67.0 in | Wt 144.0 lb

## 2014-08-03 DIAGNOSIS — R0789 Other chest pain: Secondary | ICD-10-CM | POA: Diagnosis not present

## 2014-08-03 DIAGNOSIS — I6521 Occlusion and stenosis of right carotid artery: Secondary | ICD-10-CM | POA: Diagnosis not present

## 2014-08-03 DIAGNOSIS — I6529 Occlusion and stenosis of unspecified carotid artery: Secondary | ICD-10-CM

## 2014-08-03 DIAGNOSIS — I10 Essential (primary) hypertension: Secondary | ICD-10-CM

## 2014-08-03 DIAGNOSIS — I6523 Occlusion and stenosis of bilateral carotid arteries: Secondary | ICD-10-CM

## 2014-08-03 DIAGNOSIS — E785 Hyperlipidemia, unspecified: Secondary | ICD-10-CM

## 2014-08-03 MED ORDER — ATORVASTATIN CALCIUM 10 MG PO TABS: 10.0000 mg | ORAL_TABLET | Freq: Every day | ORAL | Status: DC

## 2014-08-03 NOTE — Patient Instructions (Signed)
Start lipitor 10mg  daily.  Your physician recommends that you return for a FASTING lipid profile /liver profile in 2 months.   Your physician has requested that you have a carotid duplex. This test is an ultrasound of the carotid arteries in your neck. It looks at blood flow through these arteries that supply the brain with blood. Allow one hour for this exam. There are no restrictions or special instructions. December 2016  Your physician wants you to follow-up in: 1 year with Dr Aundra Dubin. (December 2016). You will receive a reminder letter in the mail two months in advance. If you don't receive a letter, please call our office to schedule the follow-up appointment.

## 2014-08-04 DIAGNOSIS — R079 Chest pain, unspecified: Secondary | ICD-10-CM | POA: Insufficient documentation

## 2014-08-04 DIAGNOSIS — I6529 Occlusion and stenosis of unspecified carotid artery: Secondary | ICD-10-CM | POA: Insufficient documentation

## 2014-08-04 NOTE — Progress Notes (Signed)
Patient ID: [name], male   DOB: 02/09/1936, 78 y.o.   MRN: [id number] PCP: Dr. Leanne Chang  78 yo with history of hyperlipidemia, HTN, and GERD returns for cardiology followup.  BP remains well-controlled.  No exertional chest pain.  Mild dyspnea walking up a hill, no problems on flat ground. He is not on a statin now as he had some GI side effects from simvastatin.  He is walking 2 miles/day with his wife. SBP runs 120s-130s at home.   Labs (1/11): LDL 61, HDL 42, creatinine 1.0 Labs (3/11): creatinine 1.2, K 4 Labs (1/12): LDL 100, HDL 50 Labs (3/13): K 4.3, creatinine 1.0, LDL 103, HDL 67  ECG: NSR, normal  Allergies (verified):  No Known Drug Allergies  Past Medical History: 1. Nephrolithiasis, hx of 2. Peptic ulcer disease with upper GI bleed in 2009---led to transfusion 3. Hyperlipidemia 4. GERD 5. Transient ischemic attack - 2009.  Carotid dopplers (12/15) with 40-59% RICA stenosis.  6. hypertension  7. C-spine osteoarthritis 8. Chest pain: ETT-myoview (2/11) pt exercised 4', stopped due to fatigue but had bilateral shoulder pain, BP increased to 206/60.  1 mm ST depression in inferolateral leads.  Nuclear images with no ischemia or infarction. Unable to do coronary CTA as unable to beta block HR below 70.  ETT-echo was then done, showing hypertensive BP response and no evidence for ischemia or infarction.  Suspect shoulder pain was not ischemic.  9. Leg pain: ABIs normal in 7/12.   Family History: Father: father died colon ca age 80, DM II Mother: died complications appendicitis age 53 Siblings: 22 brother alive BCCE, 1 sisiter died age 63 lupus Family History of Colon CA 1st degree relative <60 Family History Diabetes 1st degree relative No premature CAD  Social History: Retired Married Former Smoker Alcohol use-yes Regular exercise-no 3 children 1 son with OSA, obese, 1 son healthy, 1 daughter breast ca age 54  ROS: All systems reviewed and negative except as per  HPI.   Current Outpatient Prescriptions  Medication Sig Dispense Refill  . aspirin 81 MG tablet Take 81 mg by mouth daily.      . famotidine (PEPCID) 40 MG tablet Take 40 mg by mouth 2 (two) times daily.    . fish oil-omega-3 fatty acids 1000 MG capsule Take 1 g by mouth daily.     Marland Kitchen lisinopril-hydrochlorothiazide (PRINZIDE,ZESTORETIC) 20-12.5 MG per tablet Take 0.5 tablets by mouth daily. 45 tablet 0  . MULTIPLE VITAMIN PO Take by mouth daily.    . nitroGLYCERIN (NITROSTAT) 0.4 MG SL tablet Place 0.4 mg under the tongue every 5 (five) minutes as needed.      . terbinafine (LAMISIL) 250 MG tablet Take 1 tablet by mouth daily.    Marland Kitchen atorvastatin (LIPITOR) 10 MG tablet Take 1 tablet (10 mg total) by mouth daily. 30 tablet 3  . [DISCONTINUED] omeprazole (PRILOSEC) 20 MG capsule Take 20 mg by mouth daily.       No current facility-administered medications for this visit.    Blood pressure 124/70, pulse 78, height 5\' 7"  (1.702 m), weight 144 lb (65.318 kg).  General: NAD Neck: No JVD, no thyromegaly or thyroid nodule.  Lungs: Clear to auscultation bilaterally with normal respiratory effort. CV: Nondisplaced PMI.  Heart regular S1/S2, no S3/S4, no murmur.  No peripheral edema.  No carotid bruit.  Bilateral 2+ DPs.  Abdomen: Soft, nontender, no hepatosplenomegaly, no distention.  Neurologic: Alert and oriented x 3.  Psych: Normal affect. Extremities: No clubbing  or cyanosis  Assessment/Plan: 1. Carotid stenosis: Repeat carotid dopplers to be one in 12/16.  Given carotid disease, he will need to continue ASA 81 daily.  I am also going to have him start a statin, will use atorvastatin 10 mg daily with lipids/LFTs in 2 months.  2. Hyperlipidemia: As above, starting statin.  Goal LDL < 70. He has had trouble with simvastatin in the past so started atorvastatin.  3. HTN: BP is controlled.  4. Chest pain: No recurrence.   Loralie Champagne 08/04/2014

## 2014-08-22 DIAGNOSIS — Z23 Encounter for immunization: Secondary | ICD-10-CM | POA: Diagnosis not present

## 2014-09-01 ENCOUNTER — Other Ambulatory Visit: Payer: Self-pay | Admitting: *Deleted

## 2014-09-01 MED ORDER — LISINOPRIL-HYDROCHLOROTHIAZIDE 20-12.5 MG PO TABS: 0.5000 | ORAL_TABLET | Freq: Every day | ORAL | Status: DC

## 2014-10-04 ENCOUNTER — Other Ambulatory Visit (INDEPENDENT_AMBULATORY_CARE_PROVIDER_SITE_OTHER): Payer: Medicare Other | Admitting: *Deleted

## 2014-10-04 DIAGNOSIS — I6521 Occlusion and stenosis of right carotid artery: Secondary | ICD-10-CM

## 2014-10-04 DIAGNOSIS — E785 Hyperlipidemia, unspecified: Secondary | ICD-10-CM | POA: Diagnosis not present

## 2014-10-04 LAB — LIPID PANEL
Cholesterol: 133 mg/dL (ref 0–200)
HDL: 64.6 mg/dL (ref >39.00)
LDL Cholesterol: 56 mg/dL (ref 0–99)
NonHDL: 68.4
Total CHOL/HDL Ratio: 2
Triglycerides: 61 mg/dL (ref 0.0–149.0)
VLDL: 12.2 mg/dL (ref 0.0–40.0)

## 2014-10-04 LAB — HEPATIC FUNCTION PANEL
ALT: 19 U/L (ref 0–53)
AST: 19 U/L (ref 0–37)
Albumin: 4.4 g/dL (ref 3.5–5.2)
Alkaline Phosphatase: 45 U/L (ref 39–117)
Bilirubin, Direct: 0.2 mg/dL (ref 0.0–0.3)
Total Bilirubin: 0.7 mg/dL (ref 0.2–1.2)
Total Protein: 6.6 g/dL (ref 6.0–8.3)

## 2014-10-12 DIAGNOSIS — M79675 Pain in left toe(s): Secondary | ICD-10-CM | POA: Diagnosis not present

## 2014-10-12 DIAGNOSIS — M79674 Pain in right toe(s): Secondary | ICD-10-CM | POA: Diagnosis not present

## 2014-10-12 DIAGNOSIS — B351 Tinea unguium: Secondary | ICD-10-CM | POA: Diagnosis not present

## 2014-10-25 ENCOUNTER — Other Ambulatory Visit: Payer: Self-pay

## 2014-10-25 ENCOUNTER — Telehealth: Payer: Self-pay | Admitting: Cardiology

## 2014-10-25 DIAGNOSIS — I6521 Occlusion and stenosis of right carotid artery: Secondary | ICD-10-CM

## 2014-10-25 DIAGNOSIS — E785 Hyperlipidemia, unspecified: Secondary | ICD-10-CM

## 2014-10-25 MED ORDER — ATORVASTATIN CALCIUM 10 MG PO TABS: 10.0000 mg | ORAL_TABLET | Freq: Every day | ORAL | Status: DC

## 2014-10-25 NOTE — Telephone Encounter (Signed)
New message      Refill lipitor 10mg  --90day supply to [facility] (new pharmacy)

## 2014-11-07 ENCOUNTER — Ambulatory Visit (INDEPENDENT_AMBULATORY_CARE_PROVIDER_SITE_OTHER): Payer: Medicare Other | Admitting: Internal Medicine

## 2014-11-07 ENCOUNTER — Encounter: Payer: Self-pay | Admitting: Internal Medicine

## 2014-11-07 VITALS — BP 140/70 | HR 77 | Temp 97.6°F | Resp 20 | Ht 66.0 in | Wt 148.0 lb

## 2014-11-07 DIAGNOSIS — E785 Hyperlipidemia, unspecified: Secondary | ICD-10-CM | POA: Diagnosis not present

## 2014-11-07 DIAGNOSIS — I10 Essential (primary) hypertension: Secondary | ICD-10-CM

## 2014-11-07 DIAGNOSIS — K219 Gastro-esophageal reflux disease without esophagitis: Secondary | ICD-10-CM

## 2014-11-07 DIAGNOSIS — I6529 Occlusion and stenosis of unspecified carotid artery: Secondary | ICD-10-CM

## 2014-11-07 DIAGNOSIS — I6521 Occlusion and stenosis of right carotid artery: Secondary | ICD-10-CM

## 2014-11-07 DIAGNOSIS — Z0000 Encounter for general adult medical examination without abnormal findings: Secondary | ICD-10-CM | POA: Diagnosis not present

## 2014-11-07 MED ORDER — NITROGLYCERIN 0.4 MG SL SUBL: 0.4000 mg | SUBLINGUAL_TABLET | SUBLINGUAL | Status: DC | PRN

## 2014-11-07 NOTE — Patient Instructions (Addendum)
Limit your sodium (Salt) intake    It is important that you exercise regularly, at least 20 minutes 3 to 4 times per week.  If you develop chest pain or shortness of breath seek  medical attention.  Return in one year for follow-up   Health Maintenance A healthy lifestyle and preventative care can promote health and wellness.  Maintain regular health, dental, and eye exams.  Eat a healthy diet. Foods like vegetables, fruits, whole grains, low-fat dairy products, and lean protein foods contain the nutrients you need and are low in calories. Decrease your intake of foods high in solid fats, added sugars, and salt. Get information about a proper diet from your health care provider, if necessary.  Regular physical exercise is one of the most important things you can do for your health. Most adults should get at least 150 minutes of moderate-intensity exercise (any activity that increases your heart rate and causes you to sweat) each week. In addition, most adults need muscle-strengthening exercises on 2 or more days a week.   Maintain a healthy weight. The body mass index (BMI) is a screening tool to identify possible weight problems. It provides an estimate of body fat based on height and weight. Your health care provider can find your BMI and can help you achieve or maintain a healthy weight. For males 20 years and older:  A BMI below 18.5 is considered underweight.  A BMI of 18.5 to 24.9 is normal.  A BMI of 25 to 29.9 is considered overweight.  A BMI of 30 and above is considered obese.  Maintain normal blood lipids and cholesterol by exercising and minimizing your intake of saturated fat. Eat a balanced diet with plenty of fruits and vegetables. Blood tests for lipids and cholesterol should begin at age 30 and be repeated every 5 years. If your lipid or cholesterol levels are high, you are over age 39, or you are at high risk for heart disease, you may need your cholesterol levels checked  more frequently.Ongoing high lipid and cholesterol levels should be treated with medicines if diet and exercise are not working.  If you smoke, find out from your health care provider how to quit. If you do not use tobacco, do not start.  Lung cancer screening is recommended for adults aged 54-80 years who are at high risk for developing lung cancer because of a history of smoking. A yearly low-dose CT scan of the lungs is recommended for people who have at least a 30-pack-year history of smoking and are current smokers or have quit within the past 15 years. A pack year of smoking is smoking an average of 1 pack of cigarettes a day for 1 year (for example, a 30-pack-year history of smoking could mean smoking 1 pack a day for 30 years or 2 packs a day for 15 years). Yearly screening should continue until the smoker has stopped smoking for at least 15 years. Yearly screening should be stopped for people who develop a health problem that would prevent them from having lung cancer treatment.  If you choose to drink alcohol, do not have more than 2 drinks per day. One drink is considered to be 12 oz (360 mL) of beer, 5 oz (150 mL) of wine, or 1.5 oz (45 mL) of liquor.  Avoid the use of street drugs. Do not share needles with anyone. Ask for help if you need support or instructions about stopping the use of drugs.  High blood pressure causes  heart disease and increases the risk of stroke. Blood pressure should be checked at least every 1-2 years. Ongoing high blood pressure should be treated with medicines if weight loss and exercise are not effective.  If you are 36-22 years old, ask your health care provider if you should take aspirin to prevent heart disease.  Diabetes screening involves taking a blood sample to check your fasting blood sugar level. This should be done once every 3 years after age 52 if you are at a normal weight and without risk factors for diabetes. Testing should be considered at a  younger age or be carried out more frequently if you are overweight and have at least 1 risk factor for diabetes.  Colorectal cancer can be detected and often prevented. Most routine colorectal cancer screening begins at the age of 43 and continues through age 40. However, your health care provider may recommend screening at an earlier age if you have risk factors for colon cancer. On a yearly basis, your health care provider may provide home test kits to check for hidden blood in the stool. A small camera at the end of a tube may be used to directly examine the colon (sigmoidoscopy or colonoscopy) to detect the earliest forms of colorectal cancer. Talk to your health care provider about this at age 62 when routine screening begins. A direct exam of the colon should be repeated every 5-10 years through age 65, unless early forms of precancerous polyps or small growths are found.  People who are at an increased risk for hepatitis B should be screened for this virus. You are considered at high risk for hepatitis B if:  You were born in a country where hepatitis B occurs often. Talk with your health care provider about which countries are considered high risk.  Your parents were born in a high-risk country and you have not received a shot to protect against hepatitis B (hepatitis B vaccine).  You have HIV or AIDS.  You use needles to inject street drugs.  You live with, or have sex with, someone who has hepatitis B.  You are a man who has sex with other men (MSM).  You get hemodialysis treatment.  You take certain medicines for conditions like cancer, organ transplantation, and autoimmune conditions.  Hepatitis C blood testing is recommended for all people born from [birth year] through 1963 and any individual with known risk factors for hepatitis C.  Healthy men should no longer receive prostate-specific antigen (PSA) blood tests as part of routine cancer screening. Talk to your health care provider  about prostate cancer screening.  Testicular cancer screening is not recommended for adolescents or adult males who have no symptoms. Screening includes self-exam, a health care provider exam, and other screening tests. Consult with your health care provider about any symptoms you have or any concerns you have about testicular cancer.  Practice safe sex. Use condoms and avoid high-risk sexual practices to reduce the spread of sexually transmitted infections (STIs).  You should be screened for STIs, including gonorrhea and chlamydia if:  You are sexually active and are younger than 24 years.  You are older than 24 years, and your health care provider tells you that you are at risk for this type of infection.  Your sexual activity has changed since you were last screened, and you are at an increased risk for chlamydia or gonorrhea. Ask your health care provider if you are at risk.  If you are at risk of being  infected with HIV, it is recommended that you take a prescription medicine daily to prevent HIV infection. This is called pre-exposure prophylaxis (PrEP). You are considered at risk if:  You are a man who has sex with other men (MSM).  You are a heterosexual man who is sexually active with multiple partners.  You take drugs by injection.  You are sexually active with a partner who has HIV.  Talk with your health care provider about whether you are at high risk of being infected with HIV. If you choose to begin PrEP, you should first be tested for HIV. You should then be tested every 3 months for as long as you are taking PrEP.  Use sunscreen. Apply sunscreen liberally and repeatedly throughout the day. You should seek shade when your shadow is shorter than you. Protect yourself by wearing long sleeves, pants, a wide-brimmed hat, and sunglasses year round whenever you are outdoors.  Tell your health care provider of new moles or changes in moles, especially if there is a change in shape  or color. Also, tell your health care provider if a mole is larger than the size of a pencil eraser.  A one-time screening for abdominal aortic aneurysm (AAA) and surgical repair of large AAAs by ultrasound is recommended for men aged 86-75 years who are current or former smokers.  Stay current with your vaccines (immunizations). Document Released: 02/08/2008 Document Revised: 08/17/2013 Document Reviewed: 01/07/2011 [facility] Patient Information 2015 [place], [place]. This information is not intended to replace advice given to you by your health care provider. Make sure you discuss any questions you have with your health care provider.

## 2014-11-07 NOTE — Progress Notes (Signed)
Pre visit review using our clinic review tool, if applicable. No additional management support is needed unless otherwise documented below in the visit note. 

## 2014-11-07 NOTE — Progress Notes (Signed)
Subjective:    Patient ID: [name], male    DOB: 03-Jan-1936, 79 y.o.   MRN: [id number]  HPI  79 year old patient who is seen today for a preventive health examination.  He is followed by cardiology.  He has a history of carotid stenosis and dyslipidemia.  He does quite well.  He has treated hypertension.  He is also followed by GI for GERD.  He is had both upper and lower endoscopy in January 2015.  The last carotid artery Doppler study was December 2015.  Allergies (verified):  No Known Drug Allergies  Past Medical History: 1. Nephrolithiasis, hx of 2. Peptic ulcer disease with upper GI bleed in 2009---led to transfusion;  States he had major upper GI bleed in 1988 3. Hyperlipidemia 4. GERD 5. Transient ischemic attack - 2009. Carotid dopplers (12/15) with 40-59% RICA stenosis.  6. hypertension  7. C-spine osteoarthritis 8. Chest pain: ETT-myoview (2/11) pt exercised 4', stopped due to fatigue but had bilateral shoulder pain, BP increased to 206/60. 1 mm ST depression in inferolateral leads. Nuclear images with no ischemia or infarction. Unable to do coronary CTA as unable to beta block HR below 70. ETT-echo was then done, showing hypertensive BP response and no evidence for ischemia or infarction. Suspect shoulder pain was not ischemic.  9. Leg pain: ABIs normal in 7/12.   Family History: Father: father died colon ca age 63, DM II Mother: died complications appendicitis age 46 Siblings: 75 brother alive BCCE, 1 sisiter died age 64 lupus Family History of Colon CA 1st degree relative <60 Family History Diabetes 1st degree relative No premature CAD  Social History: Retired Married Former Smoker Alcohol use-yes Regular exercise-no 3 children 1 son with OSA, obese, 1 son healthy, 1 daughter breast ca age 76   Past Medical History  Diagnosis Date  . BENIGN PROSTATIC HYPERTROPHY, WITH OBSTRUCTION 01/15/2010  . GERD 02/22/2008  . HYPERGLYCEMIA 11/18/2007  .  HYPERLIPIDEMIA 02/22/2008  . HYPERTENSION, UNSPECIFIED 11/10/2009  . NEPHROLITHIASIS, HX OF 11/17/2007  . PEPTIC ULCER DISEASE 11/17/2007  . TIA (transient ischemic attack)   . Arthritis     osteo - c-spine  . Transfusion history     bleeding ulcer    History   Social History  . Marital Status: Married    Spouse Name: N/A  . Number of Children: N/A  . Years of Education: N/A   Occupational History  . Not on file.   Social History Main Topics  . Smoking status: Former Smoker    Quit date: 08/26/1978  . Smokeless tobacco: Never Used  . Alcohol Use: Yes     Comment: rarley  . Drug Use: No  . Sexual Activity: Yes   Other Topics Concern  . Not on file   Social History Narrative   Retired and married.     Past Surgical History  Procedure Laterality Date  . Fetal blood transfusion      from bleeding ulcer  . Cataract extraction, bilateral  2013    Family History  Problem Relation Age of Onset  . Appendicitis Mother     complications  . Colon cancer Father   . Diabetes type II Father   . Lupus Sister   . Colon cancer Other     1st degree relative <60    No Known Allergies  Current Outpatient Prescriptions on File Prior to Visit  Medication Sig Dispense Refill  . aspirin 81 MG tablet Take 81 mg by mouth daily.      Marland Kitchen  atorvastatin (LIPITOR) 10 MG tablet Take 1 tablet (10 mg total) by mouth daily. 90 tablet 1  . famotidine (PEPCID) 40 MG tablet Take 40 mg by mouth 2 (two) times daily.    . fish oil-omega-3 fatty acids 1000 MG capsule Take 1 g by mouth daily.     Marland Kitchen lisinopril-hydrochlorothiazide (PRINZIDE,ZESTORETIC) 20-12.5 MG per tablet Take 0.5 tablets by mouth daily. 45 tablet 3  . MULTIPLE VITAMIN PO Take by mouth daily.    . [DISCONTINUED] omeprazole (PRILOSEC) 20 MG capsule Take 20 mg by mouth daily.       No current facility-administered medications on file prior to visit.    BP 140/70 mmHg  Pulse 77  Temp(Src) 97.6 F (36.4 C) (Oral)  Resp 20  Ht  5\' 6"  (1.676 m)  Wt 148 lb (67.132 kg)  BMI 23.90 kg/m2  SpO2 98%   1. Risk factors, based on past  M,S,F history.  Cardiovascular risk factors include hypertension , dyslipidemia and peripheral vascular disease. Patient does have a history of carotid artery stenosis. No exertional chest pain  2.  Physical activities: walks daily without exercise limitations  3.  Depression/mood: no history depression or mood disorder  4.  Hearing: no deficits  5.  ADL's: independent in all aspects of daily living  6.  Fall risk: low  7.  Home safety: no problems identified  8.  Height weight, and visual acuity; patient states that he has lost 2 inches in height.  Due to AS.  No change in visual acuity.  Quite pleased with bilateral cataract extraction surgery  9.  Counseling: heart healthy diet regular exercise encouraged  10. Lab orders based on risk factors: laboratory profile reviewed from the past year  78. Referral : follow-up cardiology  12. Care plan: heart healthy diet regular exercise recommended  13. Cognitive assessment:  Alert and oriented with normal affect.  No cognitive dysfunction  14. Screening: we'll continue to have annual health examinations with screening lab Patient was provided with a written and personalized care plan  15. Provider List Update:   Primary care GI ophthalmology, cardiology dermatology    Review of Systems  Constitutional: Negative for fever, chills, activity change, appetite change and fatigue.  HENT: Negative for congestion, dental problem, ear pain, hearing loss, mouth sores, rhinorrhea, sinus pressure, sneezing, tinnitus, trouble swallowing and voice change.   Eyes: Negative for photophobia, pain, redness and visual disturbance.  Respiratory: Negative for apnea, cough, choking, chest tightness, shortness of breath and wheezing.   Cardiovascular: Negative for chest pain, palpitations and leg swelling.  Gastrointestinal: Negative for nausea,  vomiting, abdominal pain, diarrhea, constipation, blood in stool, abdominal distention, anal bleeding and rectal pain.  Genitourinary: Negative for dysuria, urgency, frequency, hematuria, flank pain, decreased urine volume, discharge, penile swelling, scrotal swelling, difficulty urinating, genital sores and testicular pain.  Musculoskeletal: Negative for myalgias, back pain, joint swelling, arthralgias, gait problem, neck pain and neck stiffness.  Skin: Negative for color change, rash and wound.  Neurological: Negative for dizziness, tremors, seizures, syncope, facial asymmetry, speech difficulty, weakness, light-headedness, numbness and headaches.  Hematological: Negative for adenopathy. Does not bruise/bleed easily.  Psychiatric/Behavioral: Negative for suicidal ideas, hallucinations, behavioral problems, confusion, sleep disturbance, self-injury, dysphoric mood, decreased concentration and agitation. The patient is not nervous/anxious.        Objective:   Physical Exam  Constitutional: He appears well-developed and well-nourished.  HENT:  Head: Normocephalic and atraumatic.  Right Ear: External ear normal.  Left Ear: External ear  normal.  Nose: Nose normal.  Mouth/Throat: Oropharynx is clear and moist.  Eyes: Conjunctivae and EOM are normal. Pupils are equal, round, and reactive to light. No scleral icterus.  Neck: Normal range of motion. Neck supple. No JVD present. No thyromegaly present.  Cardiovascular: Regular rhythm and intact distal pulses.  Exam reveals no gallop and no friction rub.   Murmur heard. Grade 2/6 systolic murmur  Dorsalis pedis pulses full.  Posterior tibial pulses not easily palpable   Pulmonary/Chest: Effort normal and breath sounds normal. He exhibits no tenderness.  Abdominal: Soft. Bowel sounds are normal. He exhibits no distension and no mass. There is no tenderness.  Genitourinary: Prostate normal and penis normal. Guaiac negative stool.  Musculoskeletal:  Normal range of motion. He exhibits no edema or tenderness.  Osteoarthritic changes of the small joints of the hands  Lymphadenopathy:    He has no cervical adenopathy.  Neurological: He is alert. He has normal reflexes. No cranial nerve deficit. Coordination normal.  Skin: Skin is warm and dry. No rash noted.  Psychiatric: He has a normal mood and affect. His behavior is normal.          Assessment & Plan:     Preventive health care Hypertension, well-controlled Dyslipidemia.  Continue statin therapy Carotid artery stenosis.  We'll continue serial carotid artery Doppler studies.  Follow-up cardiology  Heart healthy diet regular.  Exercise encouraged Recheck one year or as needed

## 2014-11-08 DIAGNOSIS — H52203 Unspecified astigmatism, bilateral: Secondary | ICD-10-CM | POA: Diagnosis not present

## 2014-11-08 DIAGNOSIS — H1789 Other corneal scars and opacities: Secondary | ICD-10-CM | POA: Diagnosis not present

## 2014-11-08 DIAGNOSIS — Z961 Presence of intraocular lens: Secondary | ICD-10-CM | POA: Diagnosis not present

## 2014-11-28 DIAGNOSIS — L821 Other seborrheic keratosis: Secondary | ICD-10-CM | POA: Diagnosis not present

## 2014-11-28 DIAGNOSIS — L219 Seborrheic dermatitis, unspecified: Secondary | ICD-10-CM | POA: Diagnosis not present

## 2014-11-28 DIAGNOSIS — L570 Actinic keratosis: Secondary | ICD-10-CM | POA: Diagnosis not present

## 2014-11-28 DIAGNOSIS — B351 Tinea unguium: Secondary | ICD-10-CM | POA: Diagnosis not present

## 2015-01-09 DIAGNOSIS — B351 Tinea unguium: Secondary | ICD-10-CM | POA: Diagnosis not present

## 2015-01-09 DIAGNOSIS — M79675 Pain in left toe(s): Secondary | ICD-10-CM | POA: Diagnosis not present

## 2015-01-09 DIAGNOSIS — M79674 Pain in right toe(s): Secondary | ICD-10-CM | POA: Diagnosis not present

## 2015-01-16 ENCOUNTER — Encounter: Payer: Self-pay | Admitting: Internal Medicine

## 2015-01-16 ENCOUNTER — Ambulatory Visit (INDEPENDENT_AMBULATORY_CARE_PROVIDER_SITE_OTHER): Payer: Medicare Other | Admitting: Internal Medicine

## 2015-01-16 VITALS — BP 140/60 | HR 85 | Temp 97.8°F | Resp 18 | Ht 66.0 in | Wt 147.0 lb

## 2015-01-16 DIAGNOSIS — I6521 Occlusion and stenosis of right carotid artery: Secondary | ICD-10-CM | POA: Diagnosis not present

## 2015-01-16 DIAGNOSIS — E785 Hyperlipidemia, unspecified: Secondary | ICD-10-CM

## 2015-01-16 DIAGNOSIS — I10 Essential (primary) hypertension: Secondary | ICD-10-CM

## 2015-01-16 DIAGNOSIS — B029 Zoster without complications: Secondary | ICD-10-CM

## 2015-01-16 MED ORDER — TRIAMCINOLONE ACETONIDE 0.1 % EX CREA: 1.0000 "application " | TOPICAL_CREAM | Freq: Two times a day (BID) | CUTANEOUS | Status: DC

## 2015-01-16 NOTE — Patient Instructions (Signed)
Call or return to clinic prn if these symptoms worsen or fail to improve as anticipated.  Shingles Shingles (herpes zoster) is an infection that is caused by the same virus that causes chickenpox (varicella). The infection causes a painful skin rash and fluid-filled blisters, which eventually break open, crust over, and heal. It may occur in any area of the body, but it usually affects only one side of the body or face. The pain of shingles usually lasts about 1 month. However, some people with shingles may develop long-term (chronic) pain in the affected area of the body. Shingles often occurs many years after the person had chickenpox. It is more common:  In people older than 50 years.  In people with weakened immune systems, such as those with HIV, AIDS, or cancer.  In people taking medicines that weaken the immune system, such as transplant medicines.  In people under great stress. CAUSES  Shingles is caused by the varicella zoster virus (VZV), which also causes chickenpox. After a person is infected with the virus, it can remain in the person's body for years in an inactive state (dormant). To cause shingles, the virus reactivates and breaks out as an infection in a nerve root. The virus can be spread from person to person (contagious) through contact with open blisters of the shingles rash. It will only spread to people who have not had chickenpox. When these people are exposed to the virus, they may develop chickenpox. They will not develop shingles. Once the blisters scab over, the person is no longer contagious and cannot spread the virus to others. SIGNS AND SYMPTOMS  Shingles shows up in stages. The initial symptoms may be pain, itching, and tingling in an area of the skin. This pain is usually described as burning, stabbing, or throbbing.In a few days or weeks, a painful red rash will appear in the area where the pain, itching, and tingling were felt. The rash is usually on one side of  the body in a band or belt-like pattern. Then, the rash usually turns into fluid-filled blisters. They will scab over and dry up in approximately 2-3 weeks. Flu-like symptoms may also occur with the initial symptoms, the rash, or the blisters. These may include:  Fever.  Chills.  Headache.  Upset stomach. DIAGNOSIS  Your health care provider will perform a skin exam to diagnose shingles. Skin scrapings or fluid samples may also be taken from the blisters. This sample will be examined under a microscope or sent to a lab for further testing. TREATMENT  There is no specific cure for shingles. Your health care provider will likely prescribe medicines to help you manage the pain, recover faster, and avoid long-term problems. This may include antiviral drugs, anti-inflammatory drugs, and pain medicines. HOME CARE INSTRUCTIONS   Take a cool bath or apply cool compresses to the area of the rash or blisters as directed. This may help with the pain and itching.   Take medicines only as directed by your health care provider.   Rest as directed by your health care provider.  Keep your rash and blisters clean with mild soap and cool water or as directed by your health care provider.  Do not pick your blisters or scratch your rash. Apply an anti-itch cream or numbing creams to the affected area as directed by your health care provider.  Keep your shingles rash covered with a loose bandage (dressing).  Avoid skin contact with:  Babies.   Pregnant women.   Children  with eczema.   Elderly people with transplants.   People with chronic illnesses, such as leukemia or AIDS.   Wear loose-fitting clothing to help ease the pain of material rubbing against the rash.  Keep all follow-up visits as directed by your health care provider.If the area involved is on your face, you may receive a referral for a specialist, such as an eye doctor (ophthalmologist) or an ear, nose, and throat (ENT)  doctor. Keeping all follow-up visits will help you avoid eye problems, chronic pain, or disability.  SEEK IMMEDIATE MEDICAL CARE IF:   You have facial pain, pain around the eye area, or loss of feeling on one side of your face.  You have ear pain or ringing in your ear.  You have loss of taste.  Your pain is not relieved with prescribed medicines.   Your redness or swelling spreads.   You have more pain and swelling.  Your condition is worsening or has changed.   You have a fever. MAKE SURE YOU:  Understand these instructions.  Will watch your condition.  Will get help right away if you are not doing well or get worse. Document Released: 08/12/2005 Document Revised: 12/27/2013 Document Reviewed: 03/26/2012 [facility] Patient Information 2015 [place], [place]. This information is not intended to replace advice given to you by your health care provider. Make sure you discuss any questions you have with your health care provider.

## 2015-01-16 NOTE — Progress Notes (Signed)
Subjective:    Patient ID: [name], male    DOB: 07-17-1936, 79 y.o.   MRN: [id number]  HPI  79 year old patient who presents with a chief complaint of a rash involving his right chest wall area.  This began 5 days ago.  He does have a prior history of a shingles vaccine.  Rash is not painful and mildly pruritic.  Past Medical History  Diagnosis Date  . BENIGN PROSTATIC HYPERTROPHY, WITH OBSTRUCTION 01/15/2010  . GERD 02/22/2008  . HYPERGLYCEMIA 11/18/2007  . HYPERLIPIDEMIA 02/22/2008  . HYPERTENSION, UNSPECIFIED 11/10/2009  . NEPHROLITHIASIS, HX OF 11/17/2007  . PEPTIC ULCER DISEASE 11/17/2007  . TIA (transient ischemic attack)   . Arthritis     osteo - c-spine  . Transfusion history     bleeding ulcer    History   Social History  . Marital Status: Married    Spouse Name: N/A  . Number of Children: N/A  . Years of Education: N/A   Occupational History  . Not on file.   Social History Main Topics  . Smoking status: Former Smoker    Quit date: 08/26/1978  . Smokeless tobacco: Never Used  . Alcohol Use: Yes     Comment: rarley  . Drug Use: No  . Sexual Activity: Yes   Other Topics Concern  . Not on file   Social History Narrative   Retired and married.     Past Surgical History  Procedure Laterality Date  . Fetal blood transfusion      from bleeding ulcer  . Cataract extraction, bilateral  2013    Family History  Problem Relation Age of Onset  . Appendicitis Mother     complications  . Colon cancer Father   . Diabetes type II Father   . Lupus Sister   . Colon cancer Other     1st degree relative <60    No Known Allergies  Current Outpatient Prescriptions on File Prior to Visit  Medication Sig Dispense Refill  . aspirin 81 MG tablet Take 81 mg by mouth daily.      Marland Kitchen atorvastatin (LIPITOR) 10 MG tablet Take 1 tablet (10 mg total) by mouth daily. 90 tablet 1  . famotidine (PEPCID) 40 MG tablet Take 40 mg by mouth 2 (two) times daily.    . fish  oil-omega-3 fatty acids 1000 MG capsule Take 1 g by mouth daily.     Marland Kitchen lisinopril-hydrochlorothiazide (PRINZIDE,ZESTORETIC) 20-12.5 MG per tablet Take 0.5 tablets by mouth daily. 45 tablet 3  . MULTIPLE VITAMIN PO Take by mouth daily.    . nitroGLYCERIN (NITROSTAT) 0.4 MG SL tablet Place 1 tablet (0.4 mg total) under the tongue every 5 (five) minutes as needed. 10 tablet 1  . [DISCONTINUED] omeprazole (PRILOSEC) 20 MG capsule Take 20 mg by mouth daily.       No current facility-administered medications on file prior to visit.    BP 140/60 mmHg  Pulse 85  Temp(Src) 97.8 F (36.6 C) (Oral)  Resp 18  Ht 5\' 6"  (1.676 m)  Wt 147 lb (66.679 kg)  BMI 23.74 kg/m2  SpO2 98%     Review of Systems  Skin: Positive for rash.       Objective:   Physical Exam  Constitutional: He appears well-developed and well-nourished. No distress.  Blood pressure 126/64  Skin:  Patchy Herpetic rash involving the right chest wall area from the midline anteriorly to the midline posteriorly  Assessment & Plan:   Shingles.  Local wound care discussed.  Doubtful will benefit from antiviral therapy at this late date

## 2015-01-16 NOTE — Progress Notes (Signed)
Pre visit review using our clinic review tool, if applicable. No additional management support is needed unless otherwise documented below in the visit note. 

## 2015-02-24 ENCOUNTER — Other Ambulatory Visit: Payer: Self-pay | Admitting: Cardiology

## 2015-05-29 DIAGNOSIS — M459 Ankylosing spondylitis of unspecified sites in spine: Secondary | ICD-10-CM | POA: Diagnosis not present

## 2015-05-29 DIAGNOSIS — M455 Ankylosing spondylitis of thoracolumbar region: Secondary | ICD-10-CM | POA: Diagnosis not present

## 2015-05-29 DIAGNOSIS — M452 Ankylosing spondylitis of cervical region: Secondary | ICD-10-CM | POA: Diagnosis not present

## 2015-05-31 DIAGNOSIS — M436 Torticollis: Secondary | ICD-10-CM | POA: Diagnosis not present

## 2015-05-31 DIAGNOSIS — M454 Ankylosing spondylitis of thoracic region: Secondary | ICD-10-CM | POA: Diagnosis not present

## 2015-05-31 DIAGNOSIS — I2510 Atherosclerotic heart disease of native coronary artery without angina pectoris: Secondary | ICD-10-CM | POA: Diagnosis not present

## 2015-06-02 ENCOUNTER — Other Ambulatory Visit: Payer: Self-pay | Admitting: *Deleted

## 2015-06-02 MED ORDER — ATORVASTATIN CALCIUM 10 MG PO TABS: ORAL_TABLET | ORAL | Status: DC

## 2015-06-06 ENCOUNTER — Ambulatory Visit: Payer: PRIVATE HEALTH INSURANCE | Admitting: Physical Therapy

## 2015-06-19 ENCOUNTER — Ambulatory Visit: Payer: Medicare Other | Attending: Orthopaedic Surgery | Admitting: Physical Therapy

## 2015-06-19 ENCOUNTER — Ambulatory Visit: Payer: PRIVATE HEALTH INSURANCE | Admitting: Cardiology

## 2015-06-19 DIAGNOSIS — R293 Abnormal posture: Secondary | ICD-10-CM | POA: Diagnosis not present

## 2015-06-19 NOTE — Therapy (Signed)
[facility] [place], [place], [address] Phone: [phone]   Fax:  [phone]  Physical Therapy Evaluation  Patient Details  Name: [name] MRN: [id number] Date of Birth: [birth year] Referring Provider: Dereck Ligas MD.  Encounter Date: 06/19/2015      PT End of Session - 06/19/15 1230    Visit Number 1   Number of Visits 18   Date for PT Re-Evaluation 08/14/15   PT Start Time 1050   PT Stop Time 1129   PT Time Calculation (min) 39 min      Past Medical History  Diagnosis Date  . BENIGN PROSTATIC HYPERTROPHY, WITH OBSTRUCTION 01/15/2010  . GERD 02/22/2008  . HYPERGLYCEMIA 11/18/2007  . HYPERLIPIDEMIA 02/22/2008  . HYPERTENSION, UNSPECIFIED 11/10/2009  . NEPHROLITHIASIS, HX OF 11/17/2007  . PEPTIC ULCER DISEASE 11/17/2007  . TIA (transient ischemic attack)   . Arthritis     osteo - c-spine  . Transfusion history     bleeding ulcer    Past Surgical History  Procedure Laterality Date  . Fetal blood transfusion      from bleeding ulcer  . Cataract extraction, bilateral  2013    There were no vitals filed for this visit.  Visit Diagnosis:  Abnormal posture - Plan: PT plan of care cert/re-cert      Subjective Assessment - 06/19/15 1239    Patient Stated Goals Regain motion.  "Especially my neck."   Currently in Pain? No/denies            [facility] PT Assessment - 06/19/15 0001    Assessment   Medical Diagnosis Ankylosing spondylits.   Referring Provider Dereck Ligas MD.   Onset Date/Surgical Date --  Ongoing.   Precautions   Precautions None   Restrictions   Weight Bearing Restrictions No   Balance Screen   Has the patient fallen in the past 6 months No   Has the patient had a decrease in activity level because of a fear of falling?  No   Is the patient reluctant to leave their home because of a fear of falling?  No   Home Environment   Living Environment Private residence   Prior Function   Level of Independence Independent   Posture/Postural Control   Posture/Postural Control Postural limitations   Postural Limitations Rounded Shoulders;Forward head;Decreased lumbar lordosis;Increased thoracic kyphosis   ROM / Strength   AROM / PROM / Strength AROM;Strength   AROM   Overall AROM Comments Left active cervical rotation= 48 degrees and right= 30 degrees.  Active lumbar extension= 12 degrees and flexion is nearly full range.  Left shoulder elevation= 92 degrees and right= 104 degrees.   Strength   Overall Strength Comments Right ankle weakness and decreased range of motion due to a childhood injury.                   [facility] Adult PT Treatment/Exercise - 06/19/15 0001    Exercises   Exercises Knee/Hip   Knee/Hip Exercises: Aerobic   Nustep Level x 8 minutes.                  PT Short Term Goals - 06/19/15 1244    PT SHORT TERM GOAL #1   Title Ind with an HEP.   Time 3   Period Weeks   Status New           PT Long Term Goals - 06/19/15 1244    PT LONG TERM GOAL #  1   Title Bilateral active cervical rotation= 55-60. degrees so he can turn his head more easiliy while driving.   Time 8   Period Weeks   Status New               Plan - Jul 11, 2015 1239    Clinical Impression Statement The ptient has a long h/o worsenening posture over many years.  He states that his pain is cyclical and is not of concern right now.  He wants to work on imroving his posture.   Pt will benefit from skilled therapeutic intervention in order to improve on the following deficits Pain;Decreased activity tolerance  Anormal posture.   Rehab Potential Good   PT Frequency 2x / week   PT Duration 8 weeks   PT Treatment/Interventions Therapeutic activities;Therapeutic exercise;Patient/family education   PT Next Visit Plan Postural exercise to improve cervical and thoracic ROM.   Consulted and Agree with Plan of Care Patient          G-Codes -  11-Jul-2015 1245    Functional Assessment Tool Used FOTO.   Functional Limitation Mobility: Walking and moving around   Mobility: Walking and Moving Around Current Status [phone]) At least 40 percent but less than 60 percent impaired, limited or restricted   Mobility: Walking and Moving Around Goal Status [phone]) At least 20 percent but less than 40 percent impaired, limited or restricted       Problem List Patient Active Problem List   Diagnosis Date Noted  . Chest pain 08/04/2014  . Carotid stenosis 08/04/2014  . Hyperlipidemia 02/21/2012  . BENIGN PROSTATIC HYPERTROPHY, WITH OBSTRUCTION 01/15/2010  . Essential hypertension 11/10/2009  . GERD 02/22/2008  . NEPHROLITHIASIS, HX OF 11/17/2007    Halford Goetzke, [name] MPT 07/11/15, 12:50 PM  [facility] [address] [place], [place], [address] Phone: [phone]   Fax:  [phone]  Name: [name] MRN: [id number] Date of Birth: [birth year]

## 2015-06-22 ENCOUNTER — Ambulatory Visit: Payer: Medicare Other | Admitting: Physical Therapy

## 2015-06-22 DIAGNOSIS — R293 Abnormal posture: Secondary | ICD-10-CM

## 2015-06-22 NOTE — Therapy (Signed)
[facility] [place], [place], [address] Phone: [phone]   Fax:  [phone]  Physical Therapy Treatment  Patient Details  Name: [name] MRN: [id number] Date of Birth: 1935/10/12 Referring Provider: Dereck Ligas MD.  Encounter Date: 06/22/2015      PT End of Session - 06/22/15 1129    Visit Number 3   Number of Visits 18   Date for PT Re-Evaluation 08/14/15   PT Start Time 1030   PT Stop Time 1122   PT Time Calculation (min) 52 min   Activity Tolerance Patient tolerated treatment well      Past Medical History  Diagnosis Date  . BENIGN PROSTATIC HYPERTROPHY, WITH OBSTRUCTION 01/15/2010  . GERD 02/22/2008  . HYPERGLYCEMIA 11/18/2007  . HYPERLIPIDEMIA 02/22/2008  . HYPERTENSION, UNSPECIFIED 11/10/2009  . NEPHROLITHIASIS, HX OF 11/17/2007  . PEPTIC ULCER DISEASE 11/17/2007  . TIA (transient ischemic attack)   . Arthritis     osteo - c-spine  . Transfusion history     bleeding ulcer    Past Surgical History  Procedure Laterality Date  . Fetal blood transfusion      from bleeding ulcer  . Cataract extraction, bilateral  2013    There were no vitals filed for this visit.  Visit Diagnosis:  Abnormal posture      Subjective Assessment - 06/22/15 1046    Subjective No new complaints.                                   PT Short Term Goals - 06/19/15 1244    PT SHORT TERM GOAL #1   Title Ind with an HEP.   Time 3   Period Weeks   Status New           PT Long Term Goals - 06/19/15 1244    PT LONG TERM GOAL #1   Title Bilateral active cervical rotation= 55-60. degrees so he can turn his head more easiliy while driving.   Time 8   Period Weeks   Status New               Problem List Patient Active Problem List   Diagnosis Date Noted  . Chest pain 08/04/2014  . Carotid stenosis 08/04/2014  . Hyperlipidemia 02/21/2012  . BENIGN PROSTATIC  HYPERTROPHY, WITH OBSTRUCTION 01/15/2010  . Essential hypertension 11/10/2009  . GERD 02/22/2008  . NEPHROLITHIASIS, HX OF 11/17/2007   Treatment: Nustep level 5 x 15 minutes; green XTS scapular retractor and bilateral shoulder extension; then Red T-band scapular retraction and the band was provided for HEP.  Seated chin tucks; gentle extension; bilateral rotation and thoracic extension and shoulder rools into scapular retraction f/b Rockerboard in parallel bars x 7 minutes.  Patient did well with treatment.  Please provide patient with pictures next visit.  Seger Jani, [name] MPT 06/22/2015, 11:30 AM  [facility] [address] [place], [place], [address] Phone: [phone]   Fax:  [phone]  Name: [name] MRN: [id number] Date of Birth: 04-Dec-1935

## 2015-06-27 ENCOUNTER — Encounter: Payer: Self-pay | Admitting: Physical Therapy

## 2015-06-27 ENCOUNTER — Ambulatory Visit: Payer: Medicare Other | Attending: Orthopaedic Surgery | Admitting: Physical Therapy

## 2015-06-27 DIAGNOSIS — R293 Abnormal posture: Secondary | ICD-10-CM | POA: Diagnosis not present

## 2015-06-27 NOTE — Patient Instructions (Signed)
PNF Strengthening: Resisted    Standing with resistive band around each hand, bring right arm up and away, thumb back. Repeat __10__ times per set. Do __3__ sets per session. Do __2__ sessions per day.  http://orth.exer.us/918   Copyright  VHI. All rights reserved.  Rowing: Resisted (Sitting)    Long-sit with resistive band around feet, hands firmly holding ends. Pull elbows back. Repeat __10__ times per set. Do __3__ sets per session. Do ___2_ sessions per day.  http://orth.exer.us/184   Copyright  VHI. All rights reserved.  Flexibility: Neck Retraction    Pull head straight back, keeping eyes and jaw level. Repeat __10__ times per set. Do __3__ sets per session. Do __2__ sessions per day.  http://orth.exer.us/344   Copyright  VHI. All rights reserved.

## 2015-06-27 NOTE — Therapy (Signed)
[facility] [place], [place], [address] Phone: [phone]   Fax:  [phone]  Physical Therapy Treatment  Patient Details  Name: [name] MRN: [id number] Date of Birth: [birth year] Referring Provider: Dereck Ligas MD.  Encounter Date: 06/27/2015      PT End of Session - 06/27/15 1120    Visit Number 4   Number of Visits 18   Date for PT Re-Evaluation 08/14/15   PT Start Time 1116   PT Stop Time 1208   PT Time Calculation (min) 52 min   Activity Tolerance Patient tolerated treatment well   Behavior During Therapy [facility] for tasks assessed/performed      Past Medical History  Diagnosis Date  . BENIGN PROSTATIC HYPERTROPHY, WITH OBSTRUCTION 01/15/2010  . GERD 02/22/2008  . HYPERGLYCEMIA 11/18/2007  . HYPERLIPIDEMIA 02/22/2008  . HYPERTENSION, UNSPECIFIED 11/10/2009  . NEPHROLITHIASIS, HX OF 11/17/2007  . PEPTIC ULCER DISEASE 11/17/2007  . TIA (transient ischemic attack)   . Arthritis     osteo - c-spine  . Transfusion history     bleeding ulcer    Past Surgical History  Procedure Laterality Date  . Fetal blood transfusion      from bleeding ulcer  . Cataract extraction, bilateral  2013    There were no vitals filed for this visit.  Visit Diagnosis:  Abnormal posture      Subjective Assessment - 06/27/15 1120    Subjective No new complaints.   Patient Stated Goals Regain motion.  "Especially my neck."   Currently in Pain? No/denies            [facility] PT Assessment - 06/27/15 0001    Assessment   Medical Diagnosis Ankylosing spondylits.                     [facility] Adult PT Treatment/Exercise - 06/27/15 0001    Exercises   Exercises Neck   Neck Exercises: Theraband   Scapula Retraction Green;Other (comment)  XTS x30 reps   Shoulder Extension Green;Other (comment)  XTS x30 reps   Neck Exercises: Seated   Neck Retraction Other reps (comment)  x30 reps   Cervical Rotation  Both;20 reps   W Back 20 reps   Shoulder Rolls Backwards;Other reps (comment)  x30 reps   Upper Extremity D2 Extension;20 reps;Theraband  BUE   Theraband Level (UE D2) Level 2 (Red)   Knee/Hip Exercises: Aerobic   Nustep L5 x15 min             Balance Exercises - 06/27/15 1210    Balance Exercises: Standing   Standing Eyes Opened Narrow base of support (BOS);Wide (BOA);Foam/compliant surface;Other reps (comment)  x5 min   Rockerboard Anterior/posterior;Other time (comment);Intermittent UE support  x5 min           PT Education - 06/27/15 1143    Education provided Yes   Education Details HEP- PNF strengthening, rows, chin tucks with red therabnd and yellow and wrote in instructions of W back.   Person(s) Educated Patient   Methods Explanation;Demonstration;Verbal cues;Handout   Comprehension Verbalized understanding;Returned demonstration;Verbal cues required          PT Short Term Goals - 06/19/15 1244    PT SHORT TERM GOAL #1   Title Ind with an HEP.   Time 3   Period Weeks   Status New           PT Long Term Goals - 06/19/15 1244    PT  LONG TERM GOAL #1   Title Bilateral active cervical rotation= 55-60. degrees so he can turn his head more easiliy while driving.   Time 8   Period Weeks   Status New               Plan - 06/27/15 1212    Clinical Impression Statement Patient tolerated today's treatment well with no complaints of pain or difficulty. Tolerated new postural exercises well and responded well to demonstration for HEP exercises at home to ensure proper form. Accepted postural HEP without questions and was given red and yellow theraband to progress the resistance at home per PTA and MPT instructions. Patient tolerated balance exercises well with varying degrees of UE support although patient is aware of increased difficulty with no UE support and needing to begin with at least 1 UE for safety.   Pt will benefit from skilled therapeutic  intervention in order to improve on the following deficits Pain;Decreased activity tolerance   Rehab Potential Good   PT Frequency 2x / week   PT Duration 8 weeks   PT Treatment/Interventions Therapeutic activities;Therapeutic exercise;Patient/family education   PT Next Visit Plan Continue postural exercises and balance exercises per MPT POC.   PT Home Exercise Plan W back, B scapular rows, PNF D2 strengthening and chin tucks 06/27/2015   Consulted and Agree with Plan of Care Patient        Problem List Patient Active Problem List   Diagnosis Date Noted  . Chest pain 08/04/2014  . Carotid stenosis 08/04/2014  . Hyperlipidemia 02/21/2012  . BENIGN PROSTATIC HYPERTROPHY, WITH OBSTRUCTION 01/15/2010  . Essential hypertension 11/10/2009  . GERD 02/22/2008  . NEPHROLITHIASIS, HX OF 11/17/2007    Ahmed Prima, PTA 06/27/2015 12:17 PM  [facility] [place], [place], [address] Phone: [phone]   Fax:  [phone]  Name: SHAKIM FAITH MRN: [id number] Date of Birth: [birth year]

## 2015-06-30 ENCOUNTER — Ambulatory Visit: Payer: Medicare Other | Admitting: Physical Therapy

## 2015-06-30 ENCOUNTER — Encounter: Payer: Self-pay | Admitting: Physical Therapy

## 2015-06-30 DIAGNOSIS — R293 Abnormal posture: Secondary | ICD-10-CM | POA: Diagnosis not present

## 2015-06-30 NOTE — Therapy (Signed)
[facility] [place], [place], [address] Phone: [phone]   Fax:  [phone]  Physical Therapy Treatment  Patient Details  Name: [name] MRN: [id number] Date of Birth: [birth year] Referring Provider: Dereck Ligas MD.  Encounter Date: 06/30/2015      PT End of Session - 06/30/15 1035    Visit Number 5   Number of Visits 18   Date for PT Re-Evaluation 08/14/15   PT Start Time 1033   PT Stop Time 1134   PT Time Calculation (min) 61 min   Activity Tolerance Patient tolerated treatment well   Behavior During Therapy [facility] for tasks assessed/performed      Past Medical History  Diagnosis Date  . BENIGN PROSTATIC HYPERTROPHY, WITH OBSTRUCTION 01/15/2010  . GERD 02/22/2008  . HYPERGLYCEMIA 11/18/2007  . HYPERLIPIDEMIA 02/22/2008  . HYPERTENSION, UNSPECIFIED 11/10/2009  . NEPHROLITHIASIS, HX OF 11/17/2007  . PEPTIC ULCER DISEASE 11/17/2007  . TIA (transient ischemic attack)   . Arthritis     osteo - c-spine  . Transfusion history     bleeding ulcer    Past Surgical History  Procedure Laterality Date  . Fetal blood transfusion      from bleeding ulcer  . Cataract extraction, bilateral  2013    There were no vitals filed for this visit.  Visit Diagnosis:  Abnormal posture      Subjective Assessment - 06/30/15 1035    Subjective Reports no significant pain.   Patient Stated Goals Regain motion.  "Especially my neck."   Currently in Pain? No/denies            [facility] PT Assessment - 06/30/15 0001    Assessment   Medical Diagnosis Ankylosing spondylits.                     [facility] Adult PT Treatment/Exercise - 06/30/15 0001    Neck Exercises: Theraband   Scapula Retraction Other (comment)  x30 reps with orange XTS   Shoulder Extension Other (comment)  x30 reps with Orange XTS   Neck Exercises: Standing   UE D2 Limitations Yellow theraband bilaterally x20 reps   Other Standing  Exercises B shoulder ER yellow theraband x20 reps   Other Standing Exercises W backs AROM x30 reps   Neck Exercises: Seated   Neck Retraction Other reps (comment)  x30 reps with tactile feedback with pillow behind neck   Cervical Rotation Both;20 reps   Other Seated Exercise Seated AROM cervical ext and flexion x20 reps   Knee/Hip Exercises: Aerobic   Nustep L5 x17 min             Balance Exercises - 06/30/15 1116    Balance Exercises: Standing   Standing Eyes Opened Narrow base of support (BOS);Wide (BOA);Foam/compliant surface;Other reps (comment)  x4 min; x2 min with head turns   Tandem Stance Eyes open;Intermittent upper extremity support  Bilaterally x2 min each   Rockerboard Anterior/posterior;EO  x4 min             PT Short Term Goals - 06/19/15 1244    PT SHORT TERM GOAL #1   Title Ind with an HEP.   Time 3   Period Weeks   Status New           PT Long Term Goals - 06/19/15 1244    PT LONG TERM GOAL #1   Title Bilateral active cervical rotation= 55-60. degrees so he can turn his head more easiliy while  driving.   Time 8   Period Weeks   Status New               Plan - 06/30/15 1140    Clinical Impression Statement Patient tolerated today's treatment well with no complaints of pain or soreness. Tolerated postural exercises and adjustments well with no complaints regarding pain or difficulty. Requires multiple VCs for erect stance and correct cervical posture with therapeutic exercises as well as balance exercises. Patient did well with balance exercises and has minimal wavering with tandem stance and toes on foam exercise. Close supervision was utilized during balance exercises.   Pt will benefit from skilled therapeutic intervention in order to improve on the following deficits Pain;Decreased activity tolerance   Rehab Potential Good   PT Frequency 2x / week   PT Duration 8 weeks   PT Treatment/Interventions Therapeutic activities;Therapeutic  exercise;Patient/family education   PT Next Visit Plan Continue postural exercises and balance exercises per MPT POC.   PT Home Exercise Plan W back, B scapular rows, PNF D2 strengthening and chin tucks 06/27/2015   Consulted and Agree with Plan of Care Patient        Problem List Patient Active Problem List   Diagnosis Date Noted  . Chest pain 08/04/2014  . Carotid stenosis 08/04/2014  . Hyperlipidemia 02/21/2012  . BENIGN PROSTATIC HYPERTROPHY, WITH OBSTRUCTION 01/15/2010  . Essential hypertension 11/10/2009  . GERD 02/22/2008  . NEPHROLITHIASIS, HX OF 11/17/2007    [name], PTA 06/30/2015, 11:44 AM  [facility] [address] [place], [place], [address] Phone: [phone]   Fax:  [phone]  Name: [name] MRN: [id number] Date of Birth: [birth year]

## 2015-07-03 ENCOUNTER — Ambulatory Visit (INDEPENDENT_AMBULATORY_CARE_PROVIDER_SITE_OTHER): Payer: Medicare Other

## 2015-07-03 ENCOUNTER — Ambulatory Visit: Payer: Medicare Other | Admitting: Physical Therapy

## 2015-07-03 DIAGNOSIS — R293 Abnormal posture: Secondary | ICD-10-CM

## 2015-07-03 DIAGNOSIS — Z23 Encounter for immunization: Secondary | ICD-10-CM | POA: Diagnosis not present

## 2015-07-03 NOTE — Therapy (Signed)
[facility] [place], [place], [address] Phone: [phone]   Fax:  [phone]  Physical Therapy Treatment  Patient Details  Name: [name] MRN: [id number] Date of Birth: 03-31-1936 Referring Provider: Dereck Ligas MD.  Encounter Date: 07/03/2015      PT End of Session - 07/03/15 1040    Visit Number 6   Number of Visits 18   Date for PT Re-Evaluation 08/14/15   PT Start Time 1031   PT Stop Time 1119   PT Time Calculation (min) 48 min   Activity Tolerance Patient tolerated treatment well   Behavior During Therapy [facility] for tasks assessed/performed      Past Medical History  Diagnosis Date  . BENIGN PROSTATIC HYPERTROPHY, WITH OBSTRUCTION 01/15/2010  . GERD 02/22/2008  . HYPERGLYCEMIA 11/18/2007  . HYPERLIPIDEMIA 02/22/2008  . HYPERTENSION, UNSPECIFIED 11/10/2009  . NEPHROLITHIASIS, HX OF 11/17/2007  . PEPTIC ULCER DISEASE 11/17/2007  . TIA (transient ischemic attack)   . Arthritis     osteo - c-spine  . Transfusion history     bleeding ulcer    Past Surgical History  Procedure Laterality Date  . Fetal blood transfusion      from bleeding ulcer  . Cataract extraction, bilateral  2013    There were no vitals filed for this visit.  Visit Diagnosis:  Abnormal posture      Subjective Assessment - 07/03/15 1208    Subjective no new complaints from patient   Patient Stated Goals Regain motion.  "Especially my neck."   Currently in Pain? No/denies                         [facility] Adult PT Treatment/Exercise - 07/03/15 0001    Neck Exercises: Theraband   Scapula Retraction Other (comment)  x30 reps with orange XTS   Shoulder Extension Other (comment)  x30 reps with Orange XTS   Neck Exercises: Standing   UE D2 Limitations Yellow theraband bilaterally x20 reps   Other Standing Exercises B shoulder ER yellow theraband x20 reps   Other Standing Exercises W backs AROM x30 reps   against wall   Neck Exercises: Supine   Other Supine Exercise thoracic extension over rolled towel  with B shoulder flexion, then horizontal ABD yellow TB x 30   Other Supine Exercise Lying on foam roller  to stretch pecs  uncomfortable for patient   Knee/Hip Exercises: Aerobic   Nustep L5 x 15   Neck Exercises: Stretches   Chest Stretch 2 reps;30 seconds  in doorway                PT Education - 07/03/15 1212    Education provided Yes   Education Details HEP   Person(s) Educated Patient   Methods Explanation;Demonstration;Handout   Comprehension Verbalized understanding;Returned demonstration          PT Short Term Goals - 06/19/15 1244    PT SHORT TERM GOAL #1   Title Ind with an HEP.   Time 3   Period Weeks   Status New           PT Long Term Goals - 06/19/15 1244    PT LONG TERM GOAL #1   Title Bilateral active cervical rotation= 55-60. degrees so he can turn his head more easiliy while driving.   Time 8   Period Weeks   Status New  Plan - 07/03/15 1212    Clinical Impression Statement Patient tolerated therex well today including thoracic extension exercises although foam roller became uncomfortable. He catches himself putting head down during TE and self corrects. He felt a good stretch with doorway stretch and will benefit from continued stretching.   PT Next Visit Plan Continue postural exercises and return to balance exercises.   PT Home Exercise Plan doorway stretch and thoracic extension    Consulted and Agree with Plan of Care Patient        Problem List Patient Active Problem List   Diagnosis Date Noted  . Chest pain 08/04/2014  . Carotid stenosis 08/04/2014  . Hyperlipidemia 02/21/2012  . BENIGN PROSTATIC HYPERTROPHY, WITH OBSTRUCTION 01/15/2010  . Essential hypertension 11/10/2009  . GERD 02/22/2008  . NEPHROLITHIASIS, HX OF 11/17/2007    Madelyn Flavors PT  07/03/2015, 12:18 PM  [facility] [address] [place], [place], [address] Phone: [phone]   Fax:  [phone]  Name: [name] MRN: [id number] Date of Birth: 04-26-1936

## 2015-07-06 ENCOUNTER — Ambulatory Visit: Payer: Medicare Other | Admitting: Physical Therapy

## 2015-07-06 ENCOUNTER — Encounter: Payer: Self-pay | Admitting: Physical Therapy

## 2015-07-06 DIAGNOSIS — R293 Abnormal posture: Secondary | ICD-10-CM

## 2015-07-06 NOTE — Therapy (Signed)
[name], [place], [address] Phone: [phone]   Fax:  [phone]  Physical Therapy Treatment  Patient Details  Name: [name] MRN: [id number] Date of Birth: Apr 01, 1936 Referring Provider: Dereck Ligas MD.  Encounter Date: 07/06/2015      PT End of Session - 07/06/15 1032    Visit Number 7   Number of Visits 18   Date for PT Re-Evaluation 08/14/15   PT Start Time 1030   PT Stop Time 1115   PT Time Calculation (min) 45 min   Activity Tolerance Patient tolerated treatment well   Behavior During Therapy [facility] for tasks assessed/performed      Past Medical History  Diagnosis Date  . BENIGN PROSTATIC HYPERTROPHY, WITH OBSTRUCTION 01/15/2010  . GERD 02/22/2008  . HYPERGLYCEMIA 11/18/2007  . HYPERLIPIDEMIA 02/22/2008  . HYPERTENSION, UNSPECIFIED 11/10/2009  . NEPHROLITHIASIS, HX OF 11/17/2007  . PEPTIC ULCER DISEASE 11/17/2007  . TIA (transient ischemic attack)   . Arthritis     osteo - c-spine  . Transfusion history     bleeding ulcer    Past Surgical History  Procedure Laterality Date  . Fetal blood transfusion      from bleeding ulcer  . Cataract extraction, bilateral  2013    There were no vitals filed for this visit.  Visit Diagnosis:  Abnormal posture      Subjective Assessment - 07/06/15 1031    Subjective Reports that he had a little L low back pain Tuesday and Wednesay and thought it may be from use of foam roller.   Patient Stated Goals Regain motion.  "Especially my neck."   Currently in Pain? No/denies            [facility] PT Assessment - 07/06/15 0001    Assessment   Medical Diagnosis Ankylosing spondylits.                     [facility] Adult PT Treatment/Exercise - 07/06/15 0001    Neck Exercises: Theraband   Scapula Retraction Other (comment)  x30 reps orange XTS   Shoulder Extension Other (comment)  x30 reps orange XTS   Neck Exercises: Standing   UE  D2 Limitations Yellow theraband bilaterally x30 reps   Other Standing Exercises B shoulder ER yellow theraband x30 reps   Other Standing Exercises W backs AROM x30 reps   Neck Exercises: Supine   Other Supine Exercise thoracic extension over rolled towel  with B shoulder flexion, then horizontal ABD yellow TB x 30   Knee/Hip Exercises: Aerobic   Nustep L6 x15 min   Neck Exercises: Stretches   Corner Stretch 3 reps;30 seconds             Balance Exercises - 07/06/15 1117    Balance Exercises: Standing   Standing Eyes Opened Narrow base of support (BOS);Foam/compliant surface;Other reps (comment)  x4 min with head turns   Rockerboard Anterior/posterior;EO;Intermittent UE support  x4 min             PT Short Term Goals - 07/06/15 1225    PT SHORT TERM GOAL #1   Title Ind with an HEP.   Time 3   Period Weeks   Status Achieved           PT Long Term Goals - 06/19/15 1244    PT LONG TERM GOAL #1   Title Bilateral active cervical rotation= 55-60. degrees so he can turn his head more easiliy while  driving.   Time 8   Period Weeks   Status New               Plan - 07/06/15 1218    Clinical Impression Statement Patient tolerated today's treatment well although he continues to required frequent VCs for proper head posture during exercises. Demonstrates tightness in pectorals and chest musculature during corner stretch with only leaning forward slightly and feeling a stretch in his chest. Continues to present with thoracic kyphosis, forward head, rounded shoulders. Tolerated thoracic extension exercise better today over foam roller without belt for comfort. Continues to show improvements regarding balance and balance activities with head turns to assist with cervical ROM. Noted he feels a 20-25% improvement since beginning PT. Denied pain following today's treatment.   Pt will benefit from skilled therapeutic intervention in order to improve on the following deficits  Pain;Decreased activity tolerance   Rehab Potential Good   PT Frequency 2x / week   PT Duration 8 weeks   PT Treatment/Interventions Therapeutic activities;Therapeutic exercise;Patient/family education   PT Next Visit Plan Continue postural exercises and return to balance exercises.   Consulted and Agree with Plan of Care Patient        Problem List Patient Active Problem List   Diagnosis Date Noted  . Chest pain 08/04/2014  . Carotid stenosis 08/04/2014  . Hyperlipidemia 02/21/2012  . BENIGN PROSTATIC HYPERTROPHY, WITH OBSTRUCTION 01/15/2010  . Essential hypertension 11/10/2009  . GERD 02/22/2008  . NEPHROLITHIASIS, HX OF 11/17/2007    Kalliopi Coupland, [name] MPT 07/06/2015, 1:32 PM  [facility] [address] [place], [place], [address] Phone: [phone]   Fax:  [phone]  Name: [name] MRN: [id number] Date of Birth: [birth year]

## 2015-07-06 NOTE — Therapy (Signed)
[facility] [place], [place], [address] Phone: [phone]   Fax:  [phone]  Physical Therapy Treatment  Patient Details  Name: [name] MRN: [id number] Date of Birth: [birth year] Referring Provider: Dereck Ligas MD.  Encounter Date: 07/06/2015      PT End of Session - 07/06/15 1032    Visit Number 7   Number of Visits 18   Date for PT Re-Evaluation 08/14/15   PT Start Time 1030   PT Stop Time 1115   PT Time Calculation (min) 45 min   Activity Tolerance Patient tolerated treatment well   Behavior During Therapy [facility] for tasks assessed/performed      Past Medical History  Diagnosis Date  . BENIGN PROSTATIC HYPERTROPHY, WITH OBSTRUCTION 01/15/2010  . GERD 02/22/2008  . HYPERGLYCEMIA 11/18/2007  . HYPERLIPIDEMIA 02/22/2008  . HYPERTENSION, UNSPECIFIED 11/10/2009  . NEPHROLITHIASIS, HX OF 11/17/2007  . PEPTIC ULCER DISEASE 11/17/2007  . TIA (transient ischemic attack)   . Arthritis     osteo - c-spine  . Transfusion history     bleeding ulcer    Past Surgical History  Procedure Laterality Date  . Fetal blood transfusion      from bleeding ulcer  . Cataract extraction, bilateral  2013    There were no vitals filed for this visit.  Visit Diagnosis:  Abnormal posture      Subjective Assessment - 07/06/15 1031    Subjective Reports that he had a little L low back pain Tuesday and Wednesay and thought it may be from use of foam roller.   Patient Stated Goals Regain motion.  "Especially my neck."   Currently in Pain? No/denies            [facility] PT Assessment - 07/06/15 0001    Assessment   Medical Diagnosis Ankylosing spondylits.                     [facility] Adult PT Treatment/Exercise - 07/06/15 0001    Neck Exercises: Theraband   Scapula Retraction Other (comment)  x30 reps orange XTS   Shoulder Extension Other (comment)  x30 reps orange XTS   Neck Exercises: Standing   UE  D2 Limitations Yellow theraband bilaterally x30 reps   Other Standing Exercises B shoulder ER yellow theraband x30 reps   Other Standing Exercises W backs AROM x30 reps   Neck Exercises: Supine   Other Supine Exercise thoracic extension over rolled towel  with B shoulder flexion, then horizontal ABD yellow TB x 30   Knee/Hip Exercises: Aerobic   Nustep L6 x15 min   Neck Exercises: Stretches   Corner Stretch 3 reps;30 seconds             Balance Exercises - 07/06/15 1117    Balance Exercises: Standing   Standing Eyes Opened Narrow base of support (BOS);Foam/compliant surface;Other reps (comment)  x4 min with head turns   Rockerboard Anterior/posterior;EO;Intermittent UE support  x4 min             PT Short Term Goals - 07/06/15 1225    PT SHORT TERM GOAL #1   Title Ind with an HEP.   Time 3   Period Weeks   Status Achieved           PT Long Term Goals - 06/19/15 1244    PT LONG TERM GOAL #1   Title Bilateral active cervical rotation= 55-60. degrees so he can turn his head more easiliy while  driving.   Time 8   Period Weeks   Status New               Plan - 07/06/15 1218    Clinical Impression Statement Patient tolerated today's treatment well although he continues to required frequent VCs for proper head posture during exercises. Demonstrates tightness in pectorals and chest musculature during corner stretch with only leaning forward slightly and feeling a stretch in his chest. Continues to present with thoracic kyphosis, forward head, rounded shoulders. Tolerated thoracic extension exercise better today over foam roller without belt for comfort. Continues to show improvements regarding balance and balance activities with head turns to assist with cervical ROM. Noted he feels a 20-25% improvement since beginning PT. Denied pain following today's treatment.   Pt will benefit from skilled therapeutic intervention in order to improve on the following deficits  Pain;Decreased activity tolerance   Rehab Potential Good   PT Frequency 2x / week   PT Duration 8 weeks   PT Treatment/Interventions Therapeutic activities;Therapeutic exercise;Patient/family education   PT Next Visit Plan Continue postural exercises and return to balance exercises.   Consulted and Agree with Plan of Care Patient        Problem List Patient Active Problem List   Diagnosis Date Noted  . Chest pain 08/04/2014  . Carotid stenosis 08/04/2014  . Hyperlipidemia 02/21/2012  . BENIGN PROSTATIC HYPERTROPHY, WITH OBSTRUCTION 01/15/2010  . Essential hypertension 11/10/2009  . GERD 02/22/2008  . NEPHROLITHIASIS, HX OF 11/17/2007    Ahmed Prima, PTA 07/06/2015 12:27 PM  [facility] [place], [place], [address] Phone: [phone]   Fax:  [phone]  Name: [name] MRN: [id number] Date of Birth: 01-Dec-1935

## 2015-07-09 DIAGNOSIS — W5501XA Bitten by cat, initial encounter: Secondary | ICD-10-CM | POA: Diagnosis not present

## 2015-07-09 DIAGNOSIS — L03011 Cellulitis of right finger: Secondary | ICD-10-CM | POA: Diagnosis not present

## 2015-07-10 ENCOUNTER — Encounter: Payer: PRIVATE HEALTH INSURANCE | Admitting: Physical Therapy

## 2015-07-10 DIAGNOSIS — M452 Ankylosing spondylitis of cervical region: Secondary | ICD-10-CM | POA: Diagnosis not present

## 2015-07-10 DIAGNOSIS — M455 Ankylosing spondylitis of thoracolumbar region: Secondary | ICD-10-CM | POA: Diagnosis not present

## 2015-07-10 DIAGNOSIS — M459 Ankylosing spondylitis of unspecified sites in spine: Secondary | ICD-10-CM | POA: Diagnosis not present

## 2015-07-11 ENCOUNTER — Ambulatory Visit: Payer: Medicare Other | Admitting: Physical Therapy

## 2015-07-11 DIAGNOSIS — R293 Abnormal posture: Secondary | ICD-10-CM | POA: Diagnosis not present

## 2015-07-11 NOTE — Therapy (Signed)
[facility] [place], [place], [address] Phone: [phone]   Fax:  [phone]  Physical Therapy Treatment  Patient Details  Name: [name] MRN: [id number] Date of Birth: [birth year] Referring Provider: Dereck Ligas MD.  Encounter Date: 07/11/2015      PT End of Session - 07/11/15 1328    Visit Number 8   Number of Visits 18   Date for PT Re-Evaluation 08/14/15   PT Start Time 1030   PT Stop Time 1119   PT Time Calculation (min) 49 min   Activity Tolerance Patient tolerated treatment well   Behavior During Therapy [facility] for tasks assessed/performed      Past Medical History  Diagnosis Date  . BENIGN PROSTATIC HYPERTROPHY, WITH OBSTRUCTION 01/15/2010  . GERD 02/22/2008  . HYPERGLYCEMIA 11/18/2007  . HYPERLIPIDEMIA 02/22/2008  . HYPERTENSION, UNSPECIFIED 11/10/2009  . NEPHROLITHIASIS, HX OF 11/17/2007  . PEPTIC ULCER DISEASE 11/17/2007  . TIA (transient ischemic attack)   . Arthritis     osteo - c-spine  . Transfusion history     bleeding ulcer    Past Surgical History  Procedure Laterality Date  . Fetal blood transfusion      from bleeding ulcer  . Cataract extraction, bilateral  2013    There were no vitals filed for this visit.  Visit Diagnosis:  Abnormal posture      Subjective Assessment - 07/11/15 1327    Subjective My cat bitme and I'm on antibiotics.                                   PT Short Term Goals - 07/06/15 1225    PT SHORT TERM GOAL #1   Title Ind with an HEP.   Time 3   Period Weeks   Status Achieved           PT Long Term Goals - 06/19/15 1244    PT LONG TERM GOAL #1   Title Bilateral active cervical rotation= 55-60. degrees so he can turn his head more easiliy while driving.   Time 8   Period Weeks   Status New               Problem List Patient Active Problem List   Diagnosis Date Noted  . Chest pain 08/04/2014  .  Carotid stenosis 08/04/2014  . Hyperlipidemia 02/21/2012  . BENIGN PROSTATIC HYPERTROPHY, WITH OBSTRUCTION 01/15/2010  . Essential hypertension 11/10/2009  . GERD 02/22/2008  . NEPHROLITHIASIS, HX OF 11/17/2007   Nustep x 20 minutes f/b Orange XTS scapular strengthening exercises f/b lat pulldown with 20# wide; mid and narrow grip to fatigue f/b chest press at last chest setting to avoid too much shoulder extension 10# with 3 different grip positions to fatigue f/b Rockerboard and Airex and verbally cueing to look at multiple locations on walls and looking at ceiling and floor and also manual challenges (8 minutes).  APPLEGATE, [name] MPT 07/11/2015, 1:42 PM  [facility] [address] [place], [place], [address] Phone: [phone]   Fax:  [phone]  Name: [name] MRN: [id number] Date of Birth: May 07, 1936

## 2015-07-13 ENCOUNTER — Ambulatory Visit: Payer: Medicare Other | Admitting: Physical Therapy

## 2015-07-13 DIAGNOSIS — R293 Abnormal posture: Secondary | ICD-10-CM

## 2015-07-13 NOTE — Therapy (Signed)
[facility] [place], [place], [address] Phone: [phone]   Fax:  [phone]  Physical Therapy Treatment  Patient Details  Name: [name] MRN: [id number] Date of Birth: August 22, 1936 Referring Provider: Dereck Ligas MD.  Encounter Date: 07/13/2015      PT End of Session - 07/13/15 1035    Visit Number 9   Number of Visits 18   Date for PT Re-Evaluation 08/14/15   PT Start Time [id number]   PT Stop Time 1116   PT Time Calculation (min) 41 min   Activity Tolerance Patient tolerated treatment well   Behavior During Therapy [facility] for tasks assessed/performed      Past Medical History  Diagnosis Date  . BENIGN PROSTATIC HYPERTROPHY, WITH OBSTRUCTION 01/15/2010  . GERD 02/22/2008  . HYPERGLYCEMIA 11/18/2007  . HYPERLIPIDEMIA 02/22/2008  . HYPERTENSION, UNSPECIFIED 11/10/2009  . NEPHROLITHIASIS, HX OF 11/17/2007  . PEPTIC ULCER DISEASE 11/17/2007  . TIA (transient ischemic attack)   . Arthritis     osteo - c-spine  . Transfusion history     bleeding ulcer    Past Surgical History  Procedure Laterality Date  . Fetal blood transfusion      from bleeding ulcer  . Cataract extraction, bilateral  2013    There were no vitals filed for this visit.  Visit Diagnosis:  Abnormal posture      Subjective Assessment - 07/13/15 1036    Subjective I'm still feeling the side effects of the medicine I'm on for my cat bite. I'm a little fatigued.   Currently in Pain? No/denies                         [facility] Adult PT Treatment/Exercise - 07/13/15 0001    Neck Exercises: Machines for Strengthening   Cybex Chest Press 10# 15 reps x 3 positions   Other Machines for Strengthening lats wide to narrow x 30 reps ea 20#   Neck Exercises: Theraband   Rows 15 reps  20#   Knee/Hip Exercises: Aerobic   Nustep L5 x 15 min             Balance Exercises - 07/13/15 1125    Balance Exercises: Standing   Standing Eyes Opened Foam/compliant surface;Head turns  with marching x 2 min each (head turns/no head turns)   Standing Eyes Closed Foam/compliant surface;20 secs  Good!   Rockerboard Anterior/posterior;EO;Intermittent UE support  x3 min   Balance Beam toes on beam working COG             PT Short Term Goals - 07/06/15 1225    PT SHORT TERM GOAL #1   Title Ind with an HEP.   Time 3   Period Weeks   Status Achieved           PT Long Term Goals - 07/13/15 1129    PT LONG TERM GOAL #1   Title Bilateral active cervical rotation= 55-60. degrees so he can turn his head more easiliy while driving.   Period Weeks   Status On-going               Plan - 07/13/15 1217    Clinical Impression Statement Patient did well with therapy today. He still is very limited with cervical rotation. He is requires less VC's this visit for keeping head in neutral. He is progressing with balance activities on compliant surfaces.   Pt will benefit from skilled therapeutic intervention  in order to improve on the following deficits Pain;Decreased activity tolerance   Rehab Potential Good   PT Frequency 2x / week   PT Duration 8 weeks   PT Treatment/Interventions Therapeutic activities;Therapeutic exercise;Patient/family education   PT Next Visit Plan FOTO; progress note to MD; work on cervical rotation; continue Upper body strengthening and challenging COG   Consulted and Agree with Plan of Care Patient        Problem List Patient Active Problem List   Diagnosis Date Noted  . Chest pain 08/04/2014  . Carotid stenosis 08/04/2014  . Hyperlipidemia 02/21/2012  . BENIGN PROSTATIC HYPERTROPHY, WITH OBSTRUCTION 01/15/2010  . Essential hypertension 11/10/2009  . GERD 02/22/2008  . NEPHROLITHIASIS, HX OF 11/17/2007   Madelyn Flavors PT  07/13/2015, 12:21 PM  [facility] [place], [place], [address] Phone: [phone]   Fax:   [phone]  Name: [name] MRN: [id number] Date of Birth: 29-Sep-1935

## 2015-07-17 ENCOUNTER — Other Ambulatory Visit: Payer: Self-pay | Admitting: Cardiology

## 2015-07-17 ENCOUNTER — Ambulatory Visit: Payer: Medicare Other | Admitting: Physical Therapy

## 2015-07-17 DIAGNOSIS — I6523 Occlusion and stenosis of bilateral carotid arteries: Secondary | ICD-10-CM

## 2015-07-17 DIAGNOSIS — R293 Abnormal posture: Secondary | ICD-10-CM | POA: Diagnosis not present

## 2015-07-17 NOTE — Therapy (Signed)
[facility] [place], [place], [address] Phone: [phone]   Fax:  [phone]  Physical Therapy Treatment  Patient Details  Name: [name] MRN: [id number] Date of Birth: Feb 05, 1936 Referring Provider: Jean Rosenthal MD  Encounter Date: 07/17/2015      PT End of Session - 07/17/15 1034    Visit Number 10   Number of Visits 18   Date for PT Re-Evaluation 08/14/15   PT Start Time 1033   PT Stop Time 1126   PT Time Calculation (min) 53 min   Activity Tolerance Patient tolerated treatment well   Behavior During Therapy [facility] for tasks assessed/performed      Past Medical History  Diagnosis Date  . BENIGN PROSTATIC HYPERTROPHY, WITH OBSTRUCTION 01/15/2010  . GERD 02/22/2008  . HYPERGLYCEMIA 11/18/2007  . HYPERLIPIDEMIA 02/22/2008  . HYPERTENSION, UNSPECIFIED 11/10/2009  . NEPHROLITHIASIS, HX OF 11/17/2007  . PEPTIC ULCER DISEASE 11/17/2007  . TIA (transient ischemic attack)   . Arthritis     osteo - c-spine  . Transfusion history     bleeding ulcer    Past Surgical History  Procedure Laterality Date  . Fetal blood transfusion      from bleeding ulcer  . Cataract extraction, bilateral  2013    There were no vitals filed for this visit.  Visit Diagnosis:  Abnormal posture      Subjective Assessment - 07/17/15 1035    Subjective No complaints today.   Currently in Pain? No/denies            [facility] PT Assessment - 07/17/15 0001    Assessment   Medical Diagnosis Ankylosing spondylits.   Referring Provider Jean Rosenthal MD   Precautions   Precautions None   AROM   Overall AROM Comments cervical active Rotation L 52 deg and R 54 deg                     [facility] Adult PT Treatment/Exercise - 07/17/15 0001    Neck Exercises: Machines for Strengthening   Cybex Chest Press 10# 20 reps x 3 positions   Other Machines for Strengthening lats wide to narrow x 30 reps ea 20#   Neck  Exercises: Theraband   Rows Other (comment)  20# x 40 reps   Neck Exercises: Seated   Other Seated Exercise cervical diagonals x 10 B   Knee/Hip Exercises: Aerobic   Nustep L6 x 20 min   Neck Exercises: Stretches   Upper Trapezius Stretch 1 rep;20 seconds             Balance Exercises - 07/17/15 1112    Balance Exercises: Standing   Balance Beam toes on beam working COG  with head turns, arms OH; no LOB             PT Short Term Goals - 07/06/15 1225    PT SHORT TERM GOAL #1   Title Ind with an HEP.   Time 3   Period Weeks   Status Achieved           PT Long Term Goals - 07/17/15 1115    PT LONG TERM GOAL #1   Title Bilateral active cervical rotation= 55-60. degrees so he can turn his head more easiliy while driving.   Time 8   Period Weeks   Status On-going               Plan - 07/17/15 1139    Clinical Impression  Statement Patient did exceptionally well with balance today challenging his COG. No LOB. His cervical ROM has improved and he is close to meeting his goal for this. He is also progressing with upper back strengthening.   Pt will benefit from skilled therapeutic intervention in order to improve on the following deficits Pain;Decreased activity tolerance   Rehab Potential Good   PT Frequency 2x / week   PT Duration 8 weeks   PT Treatment/Interventions Therapeutic activities;Therapeutic exercise;Patient/family education   PT Next Visit Plan continue Upper body strengthening and challenging COG; higher level balance; bosu?   Consulted and Agree with Plan of Care Patient          G-Codes - 2015/08/09 1142    Functional Assessment Tool Used FOTO.  41% limited vs. 51% at intake   Functional Limitation Mobility: Walking and moving around   Mobility: Walking and Moving Around Current Status [phone]) At least 40 percent but less than 60 percent impaired, limited or restricted   Mobility: Walking and Moving Around Goal Status [phone]) At least 40  percent but less than 60 percent impaired, limited or restricted      Problem List Patient Active Problem List   Diagnosis Date Noted  . Chest pain 08/04/2014  . Carotid stenosis 08/04/2014  . Hyperlipidemia 02/21/2012  . BENIGN PROSTATIC HYPERTROPHY, WITH OBSTRUCTION 01/15/2010  . Essential hypertension 11/10/2009  . GERD 02/22/2008  . NEPHROLITHIASIS, HX OF 11/17/2007    Madelyn Flavors PT  2015-08-09, 11:43 AM  [facility] [address] [place], [place], [address] Phone: [phone]   Fax:  [phone]  Name: [name] MRN: [id number] Date of Birth: [birth year]

## 2015-07-25 ENCOUNTER — Encounter: Payer: Self-pay | Admitting: Physical Therapy

## 2015-07-25 ENCOUNTER — Ambulatory Visit: Payer: Medicare Other | Admitting: Physical Therapy

## 2015-07-25 DIAGNOSIS — R293 Abnormal posture: Secondary | ICD-10-CM | POA: Diagnosis not present

## 2015-07-25 NOTE — Therapy (Signed)
[facility] [place], [place], [address] Phone: [phone]   Fax:  [phone]  Physical Therapy Treatment  Patient Details  Name: [name] MRN: [id number] Date of Birth: 09/11/1935 Referring Provider: Jean Rosenthal MD  Encounter Date: 07/25/2015      PT End of Session - 07/25/15 1128    Visit Number 11   Number of Visits 18   Date for PT Re-Evaluation 08/14/15   PT Start Time [id number]   PT Stop Time 1122   PT Time Calculation (min) 47 min   Activity Tolerance Patient tolerated treatment well   Behavior During Therapy [facility] for tasks assessed/performed      Past Medical History  Diagnosis Date  . BENIGN PROSTATIC HYPERTROPHY, WITH OBSTRUCTION 01/15/2010  . GERD 02/22/2008  . HYPERGLYCEMIA 11/18/2007  . HYPERLIPIDEMIA 02/22/2008  . HYPERTENSION, UNSPECIFIED 11/10/2009  . NEPHROLITHIASIS, HX OF 11/17/2007  . PEPTIC ULCER DISEASE 11/17/2007  . TIA (transient ischemic attack)   . Arthritis     osteo - c-spine  . Transfusion history     bleeding ulcer    Past Surgical History  Procedure Laterality Date  . Fetal blood transfusion      from bleeding ulcer  . Cataract extraction, bilateral  2013    There were no vitals filed for this visit.  Visit Diagnosis:  Abnormal posture      Subjective Assessment - 07/25/15 1037    Subjective No complaints today.   Patient Stated Goals Regain motion.  "Especially my neck."   Currently in Pain? No/denies            [facility] PT Assessment - 07/25/15 0001    Assessment   Medical Diagnosis Ankylosing spondylits.   Precautions   Precautions None                     [facility] Adult PT Treatment/Exercise - 07/25/15 0001    Neck Exercises: Machines for Strengthening   Cybex Row 20# x30 reps   Cybex Chest Press 20# low grip and superior vertical grip x30 reps   Other Machines for Strengthening lats wide to narrow x 30 reps ea 20#   Knee/Hip Exercises:  Aerobic   Nustep L7-6 x20 min   Neck Exercises: Stretches   Chest Stretch 3 reps;30 seconds  corner stretch             Balance Exercises - 07/25/15 1110    Balance Exercises: Standing   Tandem Stance Eyes open;Foam/compliant surface;Intermittent upper extremity support;Other reps (comment)  x1 min each LE   Balance Beam Toes on beam x2 min   Sidestepping Foam/compliant support;Head turns;Other reps (comment)  x4 min             PT Short Term Goals - 07/06/15 1225    PT SHORT TERM GOAL #1   Title Ind with an HEP.   Time 3   Period Weeks   Status Achieved           PT Long Term Goals - 07/17/15 1115    PT LONG TERM GOAL #1   Title Bilateral active cervical rotation= 55-60. degrees so he can turn his head more easiliy while driving.   Time 8   Period Weeks   Status On-going               Plan - 07/25/15 1214    Clinical Impression Statement Patient continues to do very well with both therapeutic exercises and balance  exercises in which COG is challenged. Patient was able to tolerate increasing chest press resistance without verbalization of increased pain. Continues to required minimal intermittant UE support with COG challenging exercises. Continues to weightshift well with head turn exercises on compliant surfaces. Goal regarding cervical ROM continues to be on-going at this time.   Pt will benefit from skilled therapeutic intervention in order to improve on the following deficits Pain;Decreased activity tolerance   Rehab Potential Good   PT Frequency 2x / week   PT Duration 8 weeks   PT Treatment/Interventions Therapeutic activities;Therapeutic exercise;Patient/family education;Neuromuscular re-education  NMR added with permission of MPT co-sign   PT Next Visit Plan continue Upper body strengthening and challenging COG; higher level balance; bosu?   PT Home Exercise Plan doorway stretch and thoracic extension    Consulted and Agree with Plan of Care  Patient        Problem List Patient Active Problem List   Diagnosis Date Noted  . Chest pain 08/04/2014  . Carotid stenosis 08/04/2014  . Hyperlipidemia 02/21/2012  . BENIGN PROSTATIC HYPERTROPHY, WITH OBSTRUCTION 01/15/2010  . Essential hypertension 11/10/2009  . GERD 02/22/2008  . NEPHROLITHIASIS, HX OF 11/17/2007    Wynelle Fanny, PTA 07/25/2015, 12:18 PM  [facility] [address] [place], [place], [address] Phone: [phone]   Fax:  [phone]  Name: [name] MRN: [id number] Date of Birth: April 12, 1936

## 2015-07-27 ENCOUNTER — Encounter: Payer: PRIVATE HEALTH INSURANCE | Admitting: Physical Therapy

## 2015-07-28 ENCOUNTER — Ambulatory Visit: Payer: Medicare Other | Attending: Orthopaedic Surgery | Admitting: Physical Therapy

## 2015-07-28 DIAGNOSIS — R293 Abnormal posture: Secondary | ICD-10-CM | POA: Diagnosis not present

## 2015-07-28 NOTE — Therapy (Signed)
[facility] [place], [place], [address] Phone: [phone]   Fax:  [phone]  Physical Therapy Treatment  Patient Details  Name: [name] MRN: [id number] Date of Birth: 06-06-1936 Referring Provider: Jean Rosenthal MD  Encounter Date: 07/28/2015      PT End of Session - 07/28/15 1031    Visit Number 12   Number of Visits 18   Date for PT Re-Evaluation 08/14/15   PT Start Time 1031   PT Stop Time 1116   PT Time Calculation (min) 45 min   Activity Tolerance Patient tolerated treatment well   Behavior During Therapy [facility] for tasks assessed/performed      Past Medical History  Diagnosis Date  . BENIGN PROSTATIC HYPERTROPHY, WITH OBSTRUCTION 01/15/2010  . GERD 02/22/2008  . HYPERGLYCEMIA 11/18/2007  . HYPERLIPIDEMIA 02/22/2008  . HYPERTENSION, UNSPECIFIED 11/10/2009  . NEPHROLITHIASIS, HX OF 11/17/2007  . PEPTIC ULCER DISEASE 11/17/2007  . TIA (transient ischemic attack)   . Arthritis     osteo - c-spine  . Transfusion history     bleeding ulcer    Past Surgical History  Procedure Laterality Date  . Fetal blood transfusion      from bleeding ulcer  . Cataract extraction, bilateral  2013    There were no vitals filed for this visit.  Visit Diagnosis:  Abnormal posture      Subjective Assessment - 07/28/15 1032    Subjective Patient states his neck has improved. Mainly just somewhat limiited to the R.    Patient Stated Goals Regain motion.  "Especially my neck."   Currently in Pain? No/denies            [facility] PT Assessment - 07/28/15 0001    Assessment   Medical Diagnosis Ankylosing spondylits.   AROM   Overall AROM Comments B cerv rot 55 degrees  In standing L rot decreased to 51, R increased to 62 deg                     [facility] Adult PT Treatment/Exercise - 07/28/15 0001    Neck Exercises: Machines for Strengthening   Cybex Row 30#x30 rows ; 20# x 30 shoulder extension   Cybex Chest Press 20# low grip and superior vertical grip x30 reps   Other Machines for Strengthening lats wide to narrow x 30 reps ea 20#   Neck Exercises: Seated   Other Seated Exercise cervical diagonals x 10 B  R rot increased by 5 degrees after diagonals, L no change   Knee/Hip Exercises: Aerobic   Nustep L 7 x 17 min; 1,001 steps   Neck Exercises: Stretches   Chest Stretch 3 reps;30 seconds  corner stretch             Balance Exercises - 07/28/15 1214    Balance Exercises: Standing   Standing Eyes Opened Foam/compliant surface;Head turns  with marching x 2 min each (head turns/no head turns)   Standing Eyes Closed Foam/compliant surface;Head turns           PT Education - 07/28/15 1209    Education provided Yes   Education Details reviewed seated neck stretches   Person(s) Educated Patient   Methods Explanation;Demonstration   Comprehension Verbalized understanding;Returned demonstration          PT Short Term Goals - 07/06/15 1225    PT SHORT TERM GOAL #1   Title Ind with an HEP.   Time 3   Period Weeks  Status Achieved           PT Long Term Goals - 07/28/15 1208    PT LONG TERM GOAL #1   Title Bilateral active cervical rotation= 55-60. degrees so he can turn his head more easiliy while driving.   Time 8   Period Weeks   Status Partially Met               Plan - 07/28/15 1144    Clinical Impression Statement Patient continues to progress with ROM of cervical spine, however it is somewhat inconsistent with sitting vs standing. Patient continues to progress with upper and lower body strengthening.   Pt will benefit from skilled therapeutic intervention in order to improve on the following deficits Pain;Decreased activity tolerance   Rehab Potential Good   PT Frequency 2x / week   PT Duration 8 weeks   PT Treatment/Interventions Therapeutic activities;Therapeutic exercise;Patient/family education;Neuromuscular re-education   PT Next  Visit Plan continue Upper body strengthening and challenging COG; higher level balance; bosu?        Problem List Patient Active Problem List   Diagnosis Date Noted  . Chest pain 08/04/2014  . Carotid stenosis 08/04/2014  . Hyperlipidemia 02/21/2012  . BENIGN PROSTATIC HYPERTROPHY, WITH OBSTRUCTION 01/15/2010  . Essential hypertension 11/10/2009  . GERD 02/22/2008  . NEPHROLITHIASIS, HX OF 11/17/2007   Madelyn Flavors PT  07/28/2015, 12:17 PM  [facility] [address] [place], [place], [address] Phone: [phone]   Fax:  [phone]  Name: EDEM TIEGS MRN: [id number] Date of Birth: 05-24-1936

## 2015-08-01 ENCOUNTER — Encounter: Payer: Self-pay | Admitting: Physical Therapy

## 2015-08-01 ENCOUNTER — Ambulatory Visit: Payer: Medicare Other | Admitting: Physical Therapy

## 2015-08-01 DIAGNOSIS — R293 Abnormal posture: Secondary | ICD-10-CM | POA: Diagnosis not present

## 2015-08-01 NOTE — Therapy (Signed)
[facility] [place], [place], [address] Phone: [phone]   Fax:  [phone]  Physical Therapy Treatment  Patient Details  Name: [name] MRN: [id number] Date of Birth: August 20, 1936 Referring Provider: Jean Rosenthal MD  Encounter Date: 08/01/2015      PT End of Session - 08/01/15 1033    Visit Number 13   Number of Visits 18   Date for PT Re-Evaluation 08/14/15   PT Start Time 1032   PT Stop Time 1117   PT Time Calculation (min) 45 min   Activity Tolerance Patient tolerated treatment well   Behavior During Therapy [facility] for tasks assessed/performed      Past Medical History  Diagnosis Date  . BENIGN PROSTATIC HYPERTROPHY, WITH OBSTRUCTION 01/15/2010  . GERD 02/22/2008  . HYPERGLYCEMIA 11/18/2007  . HYPERLIPIDEMIA 02/22/2008  . HYPERTENSION, UNSPECIFIED 11/10/2009  . NEPHROLITHIASIS, HX OF 11/17/2007  . PEPTIC ULCER DISEASE 11/17/2007  . TIA (transient ischemic attack)   . Arthritis     osteo - c-spine  . Transfusion history     bleeding ulcer    Past Surgical History  Procedure Laterality Date  . Fetal blood transfusion      from bleeding ulcer  . Cataract extraction, bilateral  2013    There were no vitals filed for this visit.  Visit Diagnosis:  Abnormal posture      Subjective Assessment - 08/01/15 1032    Subjective Reported R hand numbness following NuStep today due to "arthritis."   Patient Stated Goals Regain motion.  "Especially my neck."   Currently in Pain? No/denies            [facility] PT Assessment - 08/01/15 0001    Assessment   Medical Diagnosis Ankylosing spondylits.                     [facility] Adult PT Treatment/Exercise - 08/01/15 0001    Neck Exercises: Machines for Strengthening   Cybex Row 30# rows and ext x30 reps   Cybex Chest Press 20# B horizontal grip x15 reps each and B vertical grip x10 reps   Other Machines for Strengthening lats wide to narrow x 30  reps ea 20#   Neck Exercises: Seated   Other Seated Exercise cervical diagonals x 15 B   Knee/Hip Exercises: Aerobic   Nustep L7 x17 min' 1,017 steps             Balance Exercises - 08/01/15 1109    Balance Exercises: Standing   Rockerboard Anterior/posterior;EO;Other time (comment);Intermittent UE support  x2 min   Balance Beam Toes on foam beam with head turns x3 min   Marching Limitations on airex x65mn             PT Short Term Goals - 07/06/15 1225    PT SHORT TERM GOAL #1   Title Ind with an HEP.   Time 3   Period Weeks   Status Achieved           PT Long Term Goals - 07/28/15 1208    PT LONG TERM GOAL #1   Title Bilateral active cervical rotation= 55-60. degrees so he can turn his head more easiliy while driving.   Time 8   Period Weeks   Status Partially Met               Plan - 08/01/15 1122    Clinical Impression Statement Patient continues to tolerate treatments well with upper  body strengthening and balance exericses. Patient did well with all exercises completed but grew fatigued with many of the chest exercises today. Had 2 episodes of posterior LOB but regaied balance by catching himself. Denied pain following today's treatment.   Pt will benefit from skilled therapeutic intervention in order to improve on the following deficits Pain;Decreased activity tolerance   Rehab Potential Good   PT Frequency 2x / week   PT Duration 8 weeks   PT Treatment/Interventions Therapeutic activities;Therapeutic exercise;Patient/family education;Neuromuscular re-education   PT Next Visit Plan continue Upper body strengthening and challenging COG; higher level balance; bosu?   PT Home Exercise Plan doorway stretch and thoracic extension    Consulted and Agree with Plan of Care Patient        Problem List Patient Active Problem List   Diagnosis Date Noted  . Chest pain 08/04/2014  . Carotid stenosis 08/04/2014  . Hyperlipidemia 02/21/2012  .  BENIGN PROSTATIC HYPERTROPHY, WITH OBSTRUCTION 01/15/2010  . Essential hypertension 11/10/2009  . GERD 02/22/2008  . NEPHROLITHIASIS, HX OF 11/17/2007    Wynelle Fanny, PTA 08/01/2015, 11:27 AM  [facility] [address] [place], [place], [address] Phone: [phone]   Fax:  [phone]  Name: [name] MRN: [id number] Date of Birth: Nov 26, 1935

## 2015-08-02 ENCOUNTER — Ambulatory Visit (INDEPENDENT_AMBULATORY_CARE_PROVIDER_SITE_OTHER): Payer: Medicare Other | Admitting: Nurse Practitioner

## 2015-08-02 ENCOUNTER — Encounter: Payer: Self-pay | Admitting: Nurse Practitioner

## 2015-08-02 ENCOUNTER — Ambulatory Visit (HOSPITAL_COMMUNITY)
Admission: RE | Admit: 2015-08-02 | Discharge: 2015-08-02 | Disposition: A | Discharge status: Home / Self Care | Payer: Medicare Other | Source: Ambulatory Visit | Attending: Cardiovascular Disease | Admitting: Cardiovascular Disease

## 2015-08-02 ENCOUNTER — Encounter: Payer: Medicare Other | Admitting: Podiatry

## 2015-08-02 ENCOUNTER — Encounter: Payer: Self-pay | Admitting: Podiatry

## 2015-08-02 ENCOUNTER — Encounter (HOSPITAL_COMMUNITY): Payer: PRIVATE HEALTH INSURANCE

## 2015-08-02 VITALS — BP 122/68 | HR 78 | Ht 67.0 in | Wt 146.0 lb

## 2015-08-02 DIAGNOSIS — E785 Hyperlipidemia, unspecified: Secondary | ICD-10-CM | POA: Diagnosis not present

## 2015-08-02 DIAGNOSIS — I6523 Occlusion and stenosis of bilateral carotid arteries: Secondary | ICD-10-CM

## 2015-08-02 DIAGNOSIS — I10 Essential (primary) hypertension: Secondary | ICD-10-CM

## 2015-08-02 DIAGNOSIS — I6529 Occlusion and stenosis of unspecified carotid artery: Secondary | ICD-10-CM | POA: Diagnosis not present

## 2015-08-02 LAB — CBC
HCT: 38.1 % — ABNORMAL LOW (ref 39.0–52.0)
Hemoglobin: 12.7 g/dL — ABNORMAL LOW (ref 13.0–17.0)
MCH: 32.2 pg (ref 26.0–34.0)
MCHC: 33.3 g/dL (ref 30.0–36.0)
MCV: 96.7 fL (ref 78.0–100.0)
MPV: 9.2 fL (ref 8.6–12.4)
Platelets: 212 10*3/uL (ref 150–400)
RBC: 3.94 MIL/uL — ABNORMAL LOW (ref 4.22–5.81)
RDW: 13.3 % (ref 11.5–15.5)
WBC: 5.7 10*3/uL (ref 4.0–10.5)

## 2015-08-02 LAB — LIPID PANEL
Cholesterol: 127 mg/dL (ref 125–200)
HDL: 74 mg/dL (ref >=40)
LDL Cholesterol: 42 mg/dL (ref <130)
Total CHOL/HDL Ratio: 1.7 Ratio (ref <=5.0)
Triglycerides: 56 mg/dL (ref <150)
VLDL: 11 mg/dL (ref <30)

## 2015-08-02 LAB — BASIC METABOLIC PANEL
BUN: 18 mg/dL (ref 7–25)
CO2: 29 mmol/L (ref 20–31)
Calcium: 9.7 mg/dL (ref 8.6–10.3)
Chloride: 103 mmol/L (ref 98–110)
Creat: 1 mg/dL (ref 0.70–1.18)
Glucose, Bld: 81 mg/dL (ref 65–99)
Potassium: 4.3 mmol/L (ref 3.5–5.3)
Sodium: 139 mmol/L (ref 135–146)

## 2015-08-02 LAB — HEPATIC FUNCTION PANEL
ALT: 18 U/L (ref 9–46)
AST: 19 U/L (ref 10–35)
Albumin: 4.5 g/dL (ref 3.6–5.1)
Alkaline Phosphatase: 56 U/L (ref 40–115)
Bilirubin, Direct: 0.2 mg/dL (ref <=0.2)
Indirect Bilirubin: 0.7 mg/dL (ref 0.2–1.2)
Total Bilirubin: 0.9 mg/dL (ref 0.2–1.2)
Total Protein: 6.5 g/dL (ref 6.1–8.1)

## 2015-08-02 MED ORDER — NITROGLYCERIN 0.4 MG SL SUBL: 0.4000 mg | SUBLINGUAL_TABLET | SUBLINGUAL | Status: DC | PRN

## 2015-08-02 MED ORDER — LISINOPRIL-HYDROCHLOROTHIAZIDE 20-12.5 MG PO TABS: 0.5000 | ORAL_TABLET | Freq: Every day | ORAL | Status: DC

## 2015-08-02 MED ORDER — ATORVASTATIN CALCIUM 10 MG PO TABS: ORAL_TABLET | ORAL | Status: DC

## 2015-08-02 NOTE — Progress Notes (Signed)
CARDIOLOGY OFFICE NOTE  Date:  08/02/2015    [name] Date of Birth: 05/17/1936 Medical Record [id number]  PCP:  Nyoka Cowden, MD  Cardiologist:  Aundra Dubin    Chief Complaint  Patient presents with  . Hyperlipidemia    One year check - seen for Dr. Aundra Dubin  . Hypertension  . Shortness of Breath    History of Present Illness: [name] is a 79 y.o. male who presents today for a one year check. Seen for Dr. Aundra Dubin.  He has a history of hyperlipidemia, HTN, and GERD. He has no known CAD. Negative stress echo in 2011. Does have known carotid disease - last study in 07/2014 with 40 to 59% RICA stenosis.   Last seen last December and was doing well. Does have some chronic dyspnea.   Comes back today. Here alone. Doing ok. Says he is doing ok. No chest pain. Took a 60 day RV trip around the [place] trail this year - that was fun for him. He is having some arthritis issues but trying to remain active. BP is good. He is pleased with how he is doing.   Past Medical History: 1. Nephrolithiasis, hx of 2. Peptic ulcer disease with upper GI bleed in 2009---led to transfusion 3. Hyperlipidemia 4. GERD 5. Transient ischemic attack - 2009. Carotid dopplers (12/15) with 40-59% RICA stenosis.  6. hypertension  7. C-spine osteoarthritis 8. Chest pain: ETT-myoview (2/11) pt exercised 4', stopped due to fatigue but had bilateral shoulder pain, BP increased to 206/60. 1 mm ST depression in inferolateral leads. Nuclear images with no ischemia or infarction. Unable to do coronary CTA as unable to beta block HR below 70. ETT-echo was then done, showing hypertensive BP response and no evidence for ischemia or infarction. Suspect shoulder pain was not ischemic.  9. Leg pain: ABIs normal in 7/12.    Past Medical History  Diagnosis Date  . BENIGN PROSTATIC HYPERTROPHY, WITH OBSTRUCTION 01/15/2010  . GERD 02/22/2008  . HYPERGLYCEMIA 11/18/2007  . HYPERLIPIDEMIA 02/22/2008  .  HYPERTENSION, UNSPECIFIED 11/10/2009  . NEPHROLITHIASIS, HX OF 11/17/2007  . PEPTIC ULCER DISEASE 11/17/2007  . TIA (transient ischemic attack)   . Arthritis     osteo - c-spine  . Transfusion history     bleeding ulcer    Past Surgical History  Procedure Laterality Date  . Fetal blood transfusion      from bleeding ulcer  . Cataract extraction, bilateral  2013     Medications: Current Outpatient Prescriptions  Medication Sig Dispense Refill  . aspirin 81 MG tablet Take 81 mg by mouth daily.      Marland Kitchen atorvastatin (LIPITOR) 10 MG tablet Take 1 tablet by mouth  daily 90 tablet 3  . famotidine (PEPCID) 40 MG tablet Take 40 mg by mouth 2 (two) times daily.    . fish oil-omega-3 fatty acids 1000 MG capsule Take 1 g by mouth daily.     Marland Kitchen lisinopril-hydrochlorothiazide (PRINZIDE,ZESTORETIC) 20-12.5 MG tablet Take 0.5 tablets by mouth daily. 45 tablet 3  . MULTIPLE VITAMIN PO Take 1,000 mg by mouth daily.     . nitroGLYCERIN (NITROSTAT) 0.4 MG SL tablet Place 1 tablet (0.4 mg total) under the tongue every 5 (five) minutes as needed. 25 tablet 6  . triamcinolone cream (KENALOG) 0.1 % Apply 1 application topically 2 (two) times daily. 45 g 2  . [DISCONTINUED] omeprazole (PRILOSEC) 20 MG capsule Take 20 mg by mouth daily.  No current facility-administered medications for this visit.    Allergies: No Known Allergies  Social History: The patient  reports that he quit smoking about 36 years ago. He has never used smokeless tobacco. He reports that he drinks alcohol. He reports that he does not use illicit drugs.   Family History: The patient's family history includes Appendicitis in his mother; Colon cancer in his father and other; Diabetes type II in his father; Lupus in his sister.   Review of Systems: Please see the history of present illness.   Otherwise, the review of systems is positive for back pain, muscle pain, snoring and joint swelling.   All other systems are reviewed and  negative.   Physical Exam: VS:  BP 122/68 mmHg  Pulse 78  Ht 5\' 7"  (1.702 m)  Wt 146 lb (66.225 kg)  BMI 22.86 kg/m2 .  BMI Body mass index is 22.86 kg/(m^2).  Wt Readings from Last 3 Encounters:  08/02/15 146 lb (66.225 kg)  01/16/15 147 lb (66.679 kg)  11/07/14 148 lb (67.132 kg)    General: Pleasant. Well developed, well nourished and in no acute distress.  HEENT: Normal. Neck: Supple, no JVD, carotid bruits, or masses noted.  Cardiac: Regular rate and rhythm. No murmurs, rubs, or gallops. No edema.  Respiratory:  Lungs are clear to auscultation bilaterally with normal work of breathing.  GI: Soft and nontender.  MS: No deformity or atrophy. Gait and ROM intact. Skin: Warm and dry. Color is normal.  Neuro:  Strength and sensation are intact and no gross focal deficits noted.  Psych: Alert, appropriate and with normal affect.   LABORATORY DATA:  EKG:  EKG is ordered today. This demonstrates NSR.  Lab Results  Component Value Date   WBC 7.5 03/23/2014   HGB 13.3 03/23/2014   HCT 39.4 03/23/2014   PLT 249.0 03/23/2014   GLUCOSE 99 03/23/2014   CHOL 133 10/04/2014   TRIG 61.0 10/04/2014   HDL 64.60 10/04/2014   LDLCALC 56 10/04/2014   ALT 19 10/04/2014   AST 19 10/04/2014   NA 139 03/23/2014   K 4.0 03/23/2014   CL 102 03/23/2014   CREATININE 1.0 03/23/2014   BUN 13 03/23/2014   CO2 29 03/23/2014   TSH 4.29 03/23/2014   PSA 0.38 01/15/2010   INR 0.9 11/03/2007   HGBA1C  09/12/2010    5.6 (NOTE)                                                                       According to the ADA Clinical Practice Recommendations for 2011, when HbA1c is used as a screening test:   >=6.5%   Diagnostic of Diabetes Mellitus           (if abnormal result  is confirmed)  5.7-6.4%   Increased risk of developing Diabetes Mellitus  References:Diagnosis and Classification of Diabetes Mellitus,Diabetes [id number] 1):S62-S69 and Standards of Medical Care in         Diabetes -  2011,Diabetes Care,2011,34  (Suppl 1):S11-S61.    BNP (last 3 results) No results for input(s): BNP in the last 8760 hours.  ProBNP (last 3 results) No results for input(s): PROBNP in the last 8760 hours.   Other Studies Reviewed  Today:   Assessment/Plan: 1. Carotid stenosis: for repeat study later today.   2. Hyperlipidemia: on statin therapy  3. HTN: BP is controlled.   4. Chest pain: No recurrence.   5. Chronic dyspnea - unchanged. I do not think he needs further work up at this time.   Current medicines are reviewed with the patient today.  The patient does not have concerns regarding medicines other than what has been noted above.  The following changes have been made:  See above.  Labs/ tests ordered today include:    Orders Placed This Encounter  Procedures  . Basic metabolic panel  . CBC  . Hepatic function panel  . Lipid panel  . EKG 12-Lead     Disposition:   FU with Dr. Aundra Dubin in one year.   Patient is agreeable to this plan and will call if any problems develop in the interim.   Signed: Burtis Junes, RN, ANP-C 08/02/2015 9:41 AM  [facility] [address] [place], [place]  [address] Phone: [phone] Fax: [phone]

## 2015-08-02 NOTE — Patient Instructions (Addendum)
We will be checking the following labs today - BMET, CBC, HPF, lipids   Medication Instructions:    Continue with your current medicines.   I refilled your BP and cholesterol medicine to your mail order  I refilled your NTG to your local drug store.     Testing/Procedures To Be Arranged:  N/A  Follow-Up:   See Dr. Aundra Dubin in one year    Other Special Instructions:   N/A    If you need a refill on your cardiac medications before your next appointment, please call your pharmacy.   Call the [place] office at [phone] if you have any questions, problems or concerns.

## 2015-08-03 ENCOUNTER — Ambulatory Visit: Payer: Medicare Other | Admitting: Physical Therapy

## 2015-08-03 DIAGNOSIS — R293 Abnormal posture: Secondary | ICD-10-CM

## 2015-08-03 NOTE — Therapy (Signed)
[facility] [place], [place], [address] Phone: [phone]   Fax:  [phone]  Physical Therapy Treatment  Patient Details  Name: [name] MRN: [id number] Date of Birth: 11-26-1935 Referring Provider: Jean Rosenthal MD  Encounter Date: 08/03/2015      PT End of Session - 08/03/15 1034    Visit Number 14   Number of Visits 18   Date for PT Re-Evaluation 08/14/15   PT Start Time 1030   PT Stop Time 1117   PT Time Calculation (min) 47 min   Activity Tolerance Patient tolerated treatment well   Behavior During Therapy [facility] for tasks assessed/performed      Past Medical History  Diagnosis Date  . BENIGN PROSTATIC HYPERTROPHY, WITH OBSTRUCTION 01/15/2010  . GERD 02/22/2008  . HYPERGLYCEMIA 11/18/2007  . HYPERLIPIDEMIA 02/22/2008  . HYPERTENSION, UNSPECIFIED 11/10/2009  . NEPHROLITHIASIS, HX OF 11/17/2007  . PEPTIC ULCER DISEASE 11/17/2007  . TIA (transient ischemic attack)   . Arthritis     osteo - c-spine  . Transfusion history     bleeding ulcer    Past Surgical History  Procedure Laterality Date  . Fetal blood transfusion      from bleeding ulcer  . Cataract extraction, bilateral  2013    There were no vitals filed for this visit.  Visit Diagnosis:  Abnormal posture          [facility] PT Assessment - 08/03/15 0001    Assessment   Medical Diagnosis Ankylosing spondylits.                     [facility] Adult PT Treatment/Exercise - 08/03/15 0001    Neck Exercises: Machines for Strengthening   Cybex Chest Press 20# B horizontal grip, superior vertical grip x25 reps, lower vertical grip x10 reps   Other Machines for Strengthening lats wide to narrow x 30 reps ea 30#   Neck Exercises: Standing   Other Standing Exercises B shoulder row, ext Pink XTS x30 reps each   Neck Exercises: Stretches   Chest Stretch 3 reps;30 seconds;Other (comment)  Corner stretch             Balance  Exercises - 08/03/15 1215    Balance Exercises: Standing   Balance Beam Toes on foam beam, DLS stance sideways on beam with head turns x5 min   Marching Limitations on airex x3mn             PT Short Term Goals - 07/06/15 1225    PT SHORT TERM GOAL #1   Title Ind with an HEP.   Time 3   Period Weeks   Status Achieved           PT Long Term Goals - 07/28/15 1208    PT LONG TERM GOAL #1   Title Bilateral active cervical rotation= 55-60. degrees so he can turn his head more easiliy while driving.   Time 8   Period Weeks   Status Partially Met               Plan - 08/03/15 1216    Clinical Impression Statement Patient continues to do very well with treatments regarding upper body strengthening and balance activities as well. Was able to progress without pain to 30# Lat pulldowns but was unable to progress in weights with other chest sxercises. Continues to have to LOB posteriorly during balance exercises although he is able to regain balance with UE support. Denied pain  following today's treatment.   Pt will benefit from skilled therapeutic intervention in order to improve on the following deficits Pain;Decreased activity tolerance   Rehab Potential Good   PT Frequency 2x / week   PT Duration 8 weeks   PT Treatment/Interventions Therapeutic activities;Therapeutic exercise;Patient/family education;Neuromuscular re-education   PT Next Visit Plan continue Upper body strengthening and challenging COG; higher level balance; bosu?   PT Home Exercise Plan doorway stretch and thoracic extension    Consulted and Agree with Plan of Care Patient        Problem List Patient Active Problem List   Diagnosis Date Noted  . Chest pain 08/04/2014  . Carotid stenosis 08/04/2014  . Hyperlipidemia 02/21/2012  . BENIGN PROSTATIC HYPERTROPHY, WITH OBSTRUCTION 01/15/2010  . Essential hypertension 11/10/2009  . GERD 02/22/2008  . NEPHROLITHIASIS, HX OF 11/17/2007    Wynelle Fanny, PTA 08/03/2015, 12:21 PM  [facility] [address] [place], [place], [address] Phone: [phone]   Fax:  [phone]  Name: [name] MRN: [id number] Date of Birth: [birth year]

## 2015-08-08 ENCOUNTER — Ambulatory Visit: Payer: Medicare Other | Admitting: Physical Therapy

## 2015-08-08 DIAGNOSIS — R293 Abnormal posture: Secondary | ICD-10-CM

## 2015-08-08 NOTE — Therapy (Signed)
[facility] [place], [place], [address] Phone: [phone]   Fax:  [phone]  Physical Therapy Treatment  Patient Details  Name: [name] MRN: [id number] Date of Birth: 06-07-1936 Referring Provider: Jean Rosenthal MD  Encounter Date: 08/08/2015      PT End of Session - 08/08/15 1036    Visit Number 15   Number of Visits 18   Date for PT Re-Evaluation 08/14/15   PT Start Time 1033   PT Stop Time 1115   PT Time Calculation (min) 42 min   Activity Tolerance Patient tolerated treatment well   Behavior During Therapy [facility] for tasks assessed/performed      Past Medical History  Diagnosis Date  . BENIGN PROSTATIC HYPERTROPHY, WITH OBSTRUCTION 01/15/2010  . GERD 02/22/2008  . HYPERGLYCEMIA 11/18/2007  . HYPERLIPIDEMIA 02/22/2008  . HYPERTENSION, UNSPECIFIED 11/10/2009  . NEPHROLITHIASIS, HX OF 11/17/2007  . PEPTIC ULCER DISEASE 11/17/2007  . TIA (transient ischemic attack)   . Arthritis     osteo - c-spine  . Transfusion history     bleeding ulcer    Past Surgical History  Procedure Laterality Date  . Fetal blood transfusion      from bleeding ulcer  . Cataract extraction, bilateral  2013    There were no vitals filed for this visit.  Visit Diagnosis:  Abnormal posture      Subjective Assessment - 08/08/15 1037    Subjective Patient reports improvement in R hand numbness.   Currently in Pain? No/denies                         [facility] Adult PT Treatment/Exercise - 08/08/15 0001    Neck Exercises: Machines for Strengthening   Cybex Row 30# rows and ext x30 reps   Cybex Chest Press 20# B horizontal grip, B vertical grip x20 reps   Other Machines for Strengthening lats wide to narrow x 20 reps ea 30#   Knee/Hip Exercises: Aerobic   Nustep L7 x16.5 min' 1,000 steps   Neck Exercises: Stretches   Chest Stretch 4 reps;30 seconds  after Nu step and after cybex               Balance Exercises - 08/08/15 1119    Balance Exercises: Standing   Balance Beam Toes on foam beam (more of foot on beam)   Other Standing Exercises On bosu x 4 minutes with intermittent BUE support; some head turns  difficult             PT Short Term Goals - 07/06/15 1225    PT SHORT TERM GOAL #1   Title Ind with an HEP.   Time 3   Period Weeks   Status Achieved           PT Long Term Goals - 07/28/15 1208    PT LONG TERM GOAL #1   Title Bilateral active cervical rotation= 55-60. degrees so he can turn his head more easiliy while driving.   Time 8   Period Weeks   Status Partially Met               Plan - 08/08/15 1604    Clinical Impression Statement Patient continues to progress with strengthening and balance therex. The bosu work was challenging. He reported some pain with lower vertical chest press.   PT Next Visit Plan continue Upper body strengthening and challenging COG;continue with Bosu balance. 2 more visits then  d/c to hep.   Consulted and Agree with Plan of Care Patient        Problem List Patient Active Problem List   Diagnosis Date Noted  . Chest pain 08/04/2014  . Carotid stenosis 08/04/2014  . Hyperlipidemia 02/21/2012  . BENIGN PROSTATIC HYPERTROPHY, WITH OBSTRUCTION 01/15/2010  . Essential hypertension 11/10/2009  . GERD 02/22/2008  . NEPHROLITHIASIS, HX OF 11/17/2007    Madelyn Flavors PT  08/08/2015, 4:07 PM  [facility] [place], [place], [address] Phone: [phone]   Fax:  [phone]  Name: KEYLEN ECKENRODE MRN: [id number] Date of Birth: [birth year]

## 2015-08-10 ENCOUNTER — Ambulatory Visit: Payer: Medicare Other | Admitting: *Deleted

## 2015-08-10 ENCOUNTER — Encounter (HOSPITAL_COMMUNITY): Payer: Self-pay

## 2015-08-10 ENCOUNTER — Inpatient Hospital Stay (HOSPITAL_COMMUNITY)
Admission: EM | Admit: 2015-08-10 | Discharge: 2015-08-12 | DRG: 069 | Disposition: A | Discharge status: Home Health | Payer: Medicare Other | Attending: Internal Medicine | Admitting: Internal Medicine

## 2015-08-10 ENCOUNTER — Inpatient Hospital Stay (HOSPITAL_COMMUNITY): Payer: Medicare Other

## 2015-08-10 ENCOUNTER — Emergency Department (HOSPITAL_COMMUNITY): Payer: Medicare Other

## 2015-08-10 DIAGNOSIS — Z8711 Personal history of peptic ulcer disease: Secondary | ICD-10-CM

## 2015-08-10 DIAGNOSIS — G454 Transient global amnesia: Secondary | ICD-10-CM | POA: Diagnosis not present

## 2015-08-10 DIAGNOSIS — Z87891 Personal history of nicotine dependence: Secondary | ICD-10-CM | POA: Diagnosis not present

## 2015-08-10 DIAGNOSIS — R0682 Tachypnea, not elsewhere classified: Secondary | ICD-10-CM | POA: Diagnosis not present

## 2015-08-10 DIAGNOSIS — M459 Ankylosing spondylitis of unspecified sites in spine: Secondary | ICD-10-CM | POA: Diagnosis present

## 2015-08-10 DIAGNOSIS — R413 Other amnesia: Secondary | ICD-10-CM

## 2015-08-10 DIAGNOSIS — E785 Hyperlipidemia, unspecified: Secondary | ICD-10-CM | POA: Diagnosis present

## 2015-08-10 DIAGNOSIS — R4182 Altered mental status, unspecified: Secondary | ICD-10-CM

## 2015-08-10 DIAGNOSIS — I10 Essential (primary) hypertension: Secondary | ICD-10-CM | POA: Diagnosis not present

## 2015-08-10 DIAGNOSIS — Z7982 Long term (current) use of aspirin: Secondary | ICD-10-CM | POA: Diagnosis not present

## 2015-08-10 DIAGNOSIS — R000 Tachycardia, unspecified: Secondary | ICD-10-CM | POA: Diagnosis present

## 2015-08-10 DIAGNOSIS — F05 Delirium due to known physiological condition: Secondary | ICD-10-CM | POA: Insufficient documentation

## 2015-08-10 DIAGNOSIS — G459 Transient cerebral ischemic attack, unspecified: Principal | ICD-10-CM | POA: Diagnosis present

## 2015-08-10 DIAGNOSIS — K219 Gastro-esophageal reflux disease without esophagitis: Secondary | ICD-10-CM | POA: Diagnosis present

## 2015-08-10 DIAGNOSIS — Z79899 Other long term (current) drug therapy: Secondary | ICD-10-CM | POA: Diagnosis not present

## 2015-08-10 DIAGNOSIS — R4701 Aphasia: Secondary | ICD-10-CM | POA: Diagnosis not present

## 2015-08-10 DIAGNOSIS — N400 Enlarged prostate without lower urinary tract symptoms: Secondary | ICD-10-CM | POA: Diagnosis present

## 2015-08-10 DIAGNOSIS — R293 Abnormal posture: Secondary | ICD-10-CM

## 2015-08-10 DIAGNOSIS — I6529 Occlusion and stenosis of unspecified carotid artery: Secondary | ICD-10-CM | POA: Diagnosis present

## 2015-08-10 DIAGNOSIS — R410 Disorientation, unspecified: Secondary | ICD-10-CM | POA: Diagnosis not present

## 2015-08-10 DIAGNOSIS — F04 Amnestic disorder due to known physiological condition: Secondary | ICD-10-CM | POA: Diagnosis not present

## 2015-08-10 HISTORY — DX: Chronic or unspecified duodenal ulcer with hemorrhage: K26.4

## 2015-08-10 HISTORY — DX: Unspecified osteoarthritis, unspecified site: M19.90

## 2015-08-10 HISTORY — DX: Personal history of other diseases of the digestive system: Z87.19

## 2015-08-10 HISTORY — DX: Personal history of other medical treatment: Z92.89

## 2015-08-10 HISTORY — DX: Chronic or unspecified gastric ulcer with hemorrhage: K25.4

## 2015-08-10 HISTORY — DX: Adjustment disorder with depressed mood: F43.21

## 2015-08-10 HISTORY — DX: Ankylosing spondylitis of unspecified sites in spine: M45.9

## 2015-08-10 HISTORY — DX: Ulcer of esophagus with bleeding: K22.11

## 2015-08-10 HISTORY — DX: Calculus of kidney: N20.0

## 2015-08-10 LAB — CBC WITH DIFFERENTIAL/PLATELET
Basophils Absolute: 0 10*3/uL (ref 0.0–0.1)
Basophils Relative: 0 %
Eosinophils Absolute: 0 10*3/uL (ref 0.0–0.7)
Eosinophils Relative: 0 %
HCT: 42.3 % (ref 39.0–52.0)
Hemoglobin: 14.1 g/dL (ref 13.0–17.0)
Lymphocytes Relative: 11 %
Lymphs Abs: 0.8 10*3/uL (ref 0.7–4.0)
MCH: 32.6 pg (ref 26.0–34.0)
MCHC: 33.3 g/dL (ref 30.0–36.0)
MCV: 97.9 fL (ref 78.0–100.0)
Monocytes Absolute: 0.4 10*3/uL (ref 0.1–1.0)
Monocytes Relative: 6 %
Neutro Abs: 5.4 10*3/uL (ref 1.7–7.7)
Neutrophils Relative %: 83 %
Platelets: 206 10*3/uL (ref 150–400)
RBC: 4.32 MIL/uL (ref 4.22–5.81)
RDW: 12.9 % (ref 11.5–15.5)
WBC: 6.6 10*3/uL (ref 4.0–10.5)

## 2015-08-10 LAB — COMPREHENSIVE METABOLIC PANEL
ALT: 27 U/L (ref 17–63)
AST: 27 U/L (ref 15–41)
Albumin: 4.4 g/dL (ref 3.5–5.0)
Alkaline Phosphatase: 53 U/L (ref 38–126)
Anion gap: 9 (ref 5–15)
BUN: 16 mg/dL (ref 6–20)
CO2: 27 mmol/L (ref 22–32)
Calcium: 10.2 mg/dL (ref 8.9–10.3)
Chloride: 103 mmol/L (ref 101–111)
Creatinine, Ser: 1.21 mg/dL (ref 0.61–1.24)
GFR calc Af Amer: >60 mL/min (ref >60)
GFR calc non Af Amer: 56 mL/min — ABNORMAL LOW (ref >60)
Glucose, Bld: 117 mg/dL — ABNORMAL HIGH (ref 65–99)
Potassium: 4.3 mmol/L (ref 3.5–5.1)
Sodium: 139 mmol/L (ref 135–145)
Total Bilirubin: 2 mg/dL — ABNORMAL HIGH (ref 0.3–1.2)
Total Protein: 6.7 g/dL (ref 6.5–8.1)

## 2015-08-10 LAB — I-STAT TROPONIN, ED: Troponin i, poc: 0 ng/mL (ref 0.00–0.08)

## 2015-08-10 LAB — URINALYSIS, ROUTINE W REFLEX MICROSCOPIC
Bilirubin Urine: NEGATIVE
Glucose, UA: NEGATIVE mg/dL
Hgb urine dipstick: NEGATIVE
Ketones, ur: NEGATIVE mg/dL
Leukocytes, UA: NEGATIVE
Nitrite: NEGATIVE
Protein, ur: NEGATIVE mg/dL
Specific Gravity, Urine: 1.005 (ref 1.005–1.030)
pH: 6 (ref 5.0–8.0)

## 2015-08-10 MED ORDER — ASPIRIN 81 MG PO TABS: 81.0000 mg | ORAL_TABLET | Freq: Every day | ORAL | Status: DC

## 2015-08-10 MED ORDER — HYDROCHLOROTHIAZIDE 10 MG/ML ORAL SUSPENSION
6.2500 mg | Freq: Every day | ORAL | Status: DC
  Administered 2015-08-11: 6.25 mg via ORAL
  Filled 2015-08-10: qty 1.25

## 2015-08-10 MED ORDER — ATORVASTATIN CALCIUM 10 MG PO TABS
10.0000 mg | ORAL_TABLET | Freq: Every day | ORAL | Status: DC
  Administered 2015-08-11: 10 mg via ORAL
  Filled 2015-08-10: qty 1

## 2015-08-10 MED ORDER — ACETAMINOPHEN 325 MG PO TABS: 650.0000 mg | ORAL_TABLET | Freq: Four times a day (QID) | ORAL | Status: DC | PRN

## 2015-08-10 MED ORDER — ASPIRIN EC 325 MG PO TBEC
325.0000 mg | DELAYED_RELEASE_TABLET | Freq: Every day | ORAL | Status: DC
  Administered 2015-08-11 – 2015-08-12 (×3): 325 mg via ORAL
  Filled 2015-08-10 (×3): qty 1

## 2015-08-10 MED ORDER — OMEGA-3-ACID ETHYL ESTERS 1 G PO CAPS
1.0000 g | ORAL_CAPSULE | Freq: Every day | ORAL | Status: DC
  Administered 2015-08-11 – 2015-08-12 (×2): 1 g via ORAL
  Filled 2015-08-10 (×2): qty 1

## 2015-08-10 MED ORDER — NITROGLYCERIN 0.4 MG SL SUBL
0.4000 mg | SUBLINGUAL_TABLET | SUBLINGUAL | Status: DC | PRN
Start: 2015-08-10 — End: 2015-08-12

## 2015-08-10 MED ORDER — LISINOPRIL 10 MG PO TABS
10.0000 mg | ORAL_TABLET | Freq: Every day | ORAL | Status: DC
  Administered 2015-08-11: 10 mg via ORAL
  Filled 2015-08-10: qty 1

## 2015-08-10 MED ORDER — FAMOTIDINE 20 MG PO TABS
40.0000 mg | ORAL_TABLET | Freq: Two times a day (BID) | ORAL | Status: DC
  Administered 2015-08-11 – 2015-08-12 (×4): 40 mg via ORAL
  Filled 2015-08-10 (×4): qty 2

## 2015-08-10 MED ORDER — KETOROLAC TROMETHAMINE 15 MG/ML IJ SOLN: 15.0000 mg | Freq: Once | INTRAMUSCULAR | Status: DC

## 2015-08-10 MED ORDER — SENNOSIDES-DOCUSATE SODIUM 8.6-50 MG PO TABS: 1.0000 | ORAL_TABLET | Freq: Every evening | ORAL | Status: DC | PRN

## 2015-08-10 MED ORDER — ENOXAPARIN SODIUM 40 MG/0.4ML ~~LOC~~ SOLN
40.0000 mg | Freq: Every day | SUBCUTANEOUS | Status: DC
  Administered 2015-08-11 (×2): 40 mg via SUBCUTANEOUS
  Filled 2015-08-10 (×2): qty 0.4

## 2015-08-10 MED ORDER — PANTOPRAZOLE SODIUM 40 MG PO TBEC
40.0000 mg | DELAYED_RELEASE_TABLET | Freq: Every day | ORAL | Status: DC
  Administered 2015-08-11 – 2015-08-12 (×3): 40 mg via ORAL
  Filled 2015-08-10 (×3): qty 1

## 2015-08-10 MED ORDER — ADULT MULTIVITAMIN W/MINERALS CH
1.0000 | ORAL_TABLET | Freq: Every day | ORAL | Status: DC
  Administered 2015-08-11 – 2015-08-12 (×2): 1 via ORAL
  Filled 2015-08-10 (×2): qty 1

## 2015-08-10 MED ORDER — STROKE: EARLY STAGES OF RECOVERY BOOK
Freq: Once | Status: AC
  Administered 2015-08-11
  Filled 2015-08-10: qty 1

## 2015-08-10 MED ORDER — SODIUM CHLORIDE 0.9 % IJ SOLN
3.0000 mL | Freq: Two times a day (BID) | INTRAMUSCULAR | Status: DC
  Administered 2015-08-11 – 2015-08-12 (×4): 3 mL via INTRAVENOUS

## 2015-08-10 MED ORDER — ALPRAZOLAM 0.5 MG PO TABS
0.2500 mg | ORAL_TABLET | Freq: Once | ORAL | Status: AC
  Administered 2015-08-10: 0.25 mg via ORAL
  Filled 2015-08-10: qty 1

## 2015-08-10 MED ORDER — OMEGA-3 FATTY ACIDS 1000 MG PO CAPS: 1.0000 g | ORAL_CAPSULE | Freq: Every day | ORAL | Status: DC

## 2015-08-10 MED ORDER — LISINOPRIL-HYDROCHLOROTHIAZIDE 20-12.5 MG PO TABS: 0.5000 | ORAL_TABLET | Freq: Every day | ORAL | Status: DC

## 2015-08-10 NOTE — ED Provider Notes (Signed)
CSN: [id number]     Arrival date & time 08/10/15  1445 History   First MD Initiated Contact with Patient 08/10/15 1454     Chief Complaint  Patient presents with  . Altered Mental Status     The history is provided by the patient. No language interpreter was used.   [name] is a 79 y.o. male who presents to the Emergency Department complaining of AMS.  Level V caveat due to mild confusion.  Hx is provided by the patient.  He states he feels slightly confused since performing physical therapy today, he thinks he may have pushed it too far.  Sxs started this morning.  He reports chest congestion for the last several months.  No fevers, chest pain, SOB, abdominal pain, vomiting, diarrhea, dysuria, numbness, weakness.  Sxs are mild and constant.    Past Medical History  Diagnosis Date  . BENIGN PROSTATIC HYPERTROPHY, WITH OBSTRUCTION 01/15/2010  . GERD 02/22/2008  . HYPERGLYCEMIA 11/18/2007  . HYPERLIPIDEMIA 02/22/2008  . HYPERTENSION, UNSPECIFIED 11/10/2009  . NEPHROLITHIASIS, HX OF 11/17/2007  . PEPTIC ULCER DISEASE 11/17/2007  . TIA (transient ischemic attack)   . Arthritis     osteo - c-spine  . Transfusion history     bleeding ulcer   Past Surgical History  Procedure Laterality Date  . Fetal blood transfusion      from bleeding ulcer  . Cataract extraction, bilateral  2013   Family History  Problem Relation Age of Onset  . Appendicitis Mother     complications  . Colon cancer Father   . Diabetes type II Father   . Lupus Sister   . Colon cancer Other     1st degree relative <60   Social History  Substance Use Topics  . Smoking status: Former Smoker    Quit date: 08/26/1978  . Smokeless tobacco: Never Used  . Alcohol Use: Yes     Comment: rarley    Review of Systems  All other systems reviewed and are negative.     Allergies  Review of patient's allergies indicates no known allergies.  Home Medications   Prior to Admission medications   Medication Sig  Start Date End Date Taking? Authorizing Provider  aspirin 81 MG tablet Take 81 mg by mouth daily.     Yes Historical Provider, MD  atorvastatin (LIPITOR) 10 MG tablet Take 1 tablet by mouth  daily 08/02/15  Yes Burtis Junes, NP  famotidine (PEPCID) 40 MG tablet Take 40 mg by mouth 2 (two) times daily.   Yes Historical Provider, MD  fish oil-omega-3 fatty acids 1000 MG capsule Take 1 g by mouth daily.    Yes Historical Provider, MD  lisinopril-hydrochlorothiazide (PRINZIDE,ZESTORETIC) 20-12.5 MG tablet Take 0.5 tablets by mouth daily. 08/02/15  Yes Burtis Junes, NP  Multiple Vitamin (MULTIVITAMIN WITH MINERALS) TABS tablet Take 1 tablet by mouth daily.   Yes Historical Provider, MD  nitroGLYCERIN (NITROSTAT) 0.4 MG SL tablet Place 1 tablet (0.4 mg total) under the tongue every 5 (five) minutes as needed. Patient taking differently: Place 0.4 mg under the tongue every 5 (five) minutes as needed for chest pain.  08/02/15  Yes Burtis Junes, NP  triamcinolone cream (KENALOG) 0.1 % Apply 1 application topically 2 (two) times daily. Patient not taking: Reported on 08/10/2015 01/16/15   Marletta Lor, MD   BP 168/72 mmHg  Pulse 119  Temp(Src) 98.6 F (37 C) (Oral)  Resp 21  SpO2 100% Physical Exam  Constitutional: He is oriented to person, place, and time. He appears well-developed and well-nourished.  HENT:  Head: Normocephalic and atraumatic.  Cardiovascular: Regular rhythm.   No murmur heard. tachycardic  Pulmonary/Chest: Effort normal and breath sounds normal. No respiratory distress.  Abdominal: Soft. There is no tenderness. There is no rebound and no guarding.  Musculoskeletal: He exhibits no edema or tenderness.  Neurological: He is alert and oriented to person, place, and time.  5/5 strength in all four extremities.  Sensation to light touch intact in all four extremities  Skin: Skin is warm and dry.  Psychiatric: He has a normal mood and affect. His behavior is normal.   Nursing note and vitals reviewed.   ED Course  Procedures (including critical care time) Labs Review Labs Reviewed  COMPREHENSIVE METABOLIC PANEL - Abnormal; Notable for the following:    Glucose, Bld 117 (*)    Total Bilirubin 2.0 (*)    GFR calc non Af Amer 56 (*)    All other components within normal limits  URINALYSIS, ROUTINE W REFLEX MICROSCOPIC (NOT AT [facility])  CBC WITH DIFFERENTIAL/PLATELET  Randolm Idol, ED    Imaging Review Dg Chest 2 View  08/10/2015  CLINICAL DATA:  One-day history of altered mental status. EXAM: CHEST  2 VIEW COMPARISON:  09/26/2013. FINDINGS: The cardiac silhouette, mediastinal and hilar contours are within normal limits and stable. There is mild tortuosity of the thoracic aorta. The lungs are clear. No pleural effusion. The bony thorax is intact. Stable degenerative changes involving the thoracic spine. IMPRESSION: No acute cardiopulmonary findings. Electronically Signed   By: Marijo Sanes M.D.   On: 08/10/2015 17:07   Ct Head Wo Contrast  08/10/2015  CLINICAL DATA:  Confusion. EXAM: CT HEAD WITHOUT CONTRAST TECHNIQUE: Contiguous axial images were obtained from the base of the skull through the vertex without intravenous contrast. COMPARISON:  11/04/2007 head CT. FINDINGS: No evidence of parenchymal hemorrhage or extra-axial fluid collection. No mass lesion, mass effect, or midline shift. No CT evidence of acute infarction. Intracranial atherosclerosis. Nonspecific mild subcortical and periventricular white matter hypodensity, most in keeping with chronic small vessel ischemic change. Cerebral volume is age appropriate. No ventriculomegaly. The visualized paranasal sinuses are essentially clear. The mastoid air cells are unopacified. No evidence of calvarial fracture. IMPRESSION: 1. No evidence of acute intracranial abnormality. 2. Intracranial atherosclerosis and mild chronic small vessel ischemic white matter change . Electronically Signed   By: Ilona Sorrel M.D.   On: 08/10/2015 16:26   I have personally reviewed and evaluated these images and lab results as part of my medical decision-making.   EKG Interpretation   Date/Time:  Thursday August 10 2015 15:07:02 EST Ventricular Rate:  116 PR Interval:  179 QRS Duration: 87 QT Interval:  312 QTC Calculation: 433 R Axis:   75 Text Interpretation:  Sinus tachycardia Biatrial enlargement RSR' in V1 or  V2, right VCD or RVH Confirmed by Hazle Coca [phone]) on 08/10/2015 3:21:55  PM      MDM   Final diagnoses:  Acute confusional state   Pt here for acute confusion/memory loss that started some time today while doing PT.  On repeat evaluation in ED pt does not recall initial evaluation and repeats his initial story, adding in that he could not recall the names of the ladies at his PT appointment.  He has a nonfocal neurologic examination.  He does have persistent tachycardia in the ED, no known hx/o tachycardia.  His wife states that all  this behavior is very different for him and quite concerning.  No recent medication changes or missed medication doses.     Quintella Reichert, MD 08/10/15 [phone]

## 2015-08-10 NOTE — ED Notes (Signed)
Phlebotomy at the bedside  

## 2015-08-10 NOTE — ED Notes (Signed)
MD Rees at the bedside  

## 2015-08-10 NOTE — Therapy (Signed)
[facility] [place], [place], [address] Phone: [phone]   Fax:  [phone]  Physical Therapy Treatment  Patient Details  Name: [name] MRN: [id number] Date of Birth: June 09, 1936 Referring Provider: Jean Rosenthal MD  Encounter Date: 08/10/2015      PT End of Session - 08/10/15 1208    Visit Number 16   Number of Visits 18   Date for PT Re-Evaluation 08/14/15   PT Start Time 1030   PT Stop Time 1117   PT Time Calculation (min) 47 min      Past Medical History  Diagnosis Date  . BENIGN PROSTATIC HYPERTROPHY, WITH OBSTRUCTION 01/15/2010  . GERD 02/22/2008  . HYPERGLYCEMIA 11/18/2007  . HYPERLIPIDEMIA 02/22/2008  . HYPERTENSION, UNSPECIFIED 11/10/2009  . NEPHROLITHIASIS, HX OF 11/17/2007  . PEPTIC ULCER DISEASE 11/17/2007  . TIA (transient ischemic attack)   . Arthritis     osteo - c-spine  . Transfusion history     bleeding ulcer    Past Surgical History  Procedure Laterality Date  . Fetal blood transfusion      from bleeding ulcer  . Cataract extraction, bilateral  2013    There were no vitals filed for this visit.  Visit Diagnosis:  Abnormal posture      Subjective Assessment - 08/10/15 1048    Subjective Patient reports improvement in R hand numbness.                         Normal Adult PT Treatment/Exercise - 08/10/15 0001    Neck Exercises: Machines for Strengthening   Cybex Row 30# rows and ext x30 reps   Cybex Chest Press 20# B horizontal grip, B vertical grip x20 reps   Other Machines for Strengthening lats wide to narrow x 20 reps ea 30#   Neck Exercises: Standing   Other Standing Exercises Apollo pulley Rows 30#s x30 high and low pull   Other Standing Exercises Doorway stretcing x 5 hold 30 secs   Knee/Hip Exercises: Aerobic   Nustep L8 x16.5 min' 1,000 steps             Balance Exercises - 08/10/15 1126    Balance Exercises: Standing   Rockerboard  Anterior/posterior;EO;Other time (comment);Intermittent UE support  x2 min   Marching Limitations on airex x58mn,  also performed 3x10 diagonal reaching   Other Standing Exercises On Inverted bosu x 4 minutes with intermittent BUE support; some head turns             PT Short Term Goals - 07/06/15 1225    PT SHORT TERM GOAL #1   Title Ind with an HEP.   Time 3   Period Weeks   Status Achieved           PT Long Term Goals - 07/28/15 1208    PT LONG TERM GOAL #1   Title Bilateral active cervical rotation= 55-60. degrees so he can turn his head more easiliy while driving.   Time 8   Period Weeks   Status Partially Met               Plan - 08/10/15 1253    Clinical Impression Statement Pt did fairly well today and was able to perform all exs and act's. for posture without increased pain. His posture continues to improve with stretching and strengthening.   Pt will benefit from skilled therapeutic intervention in order to improve on the following deficits  Pain;Decreased activity tolerance   Rehab Potential Good   PT Frequency 2x / week   PT Treatment/Interventions Therapeutic activities;Therapeutic exercise;Patient/family education;Neuromuscular re-education   PT Next Visit Plan continue Upper body strengthening and challenging COG;continue with Bosu balance. 2 more visits then d/c to hep.   PT Home Exercise Plan doorway stretch and thoracic extension    Consulted and Agree with Plan of Care Patient        Problem List Patient Active Problem List   Diagnosis Date Noted  . Chest pain 08/04/2014  . Carotid stenosis 08/04/2014  . Hyperlipidemia 02/21/2012  . BENIGN PROSTATIC HYPERTROPHY, WITH OBSTRUCTION 01/15/2010  . Essential hypertension 11/10/2009  . GERD 02/22/2008  . NEPHROLITHIASIS, HX OF 11/17/2007    RAMSEUR,CHRIS, PTA 08/10/2015, 1:03 PM  [facility] [place], [place], [address] Phone:  [phone]   Fax:  [phone]  Name: [name] MRN: [id number] Date of Birth: Jan 18, 1936

## 2015-08-10 NOTE — H&P (Signed)
[facility] History and Physical  [name] [id number] DOB: 1935-10-10 DOA: 08/10/2015  Referring physician: Quintella Reichert, M.D. PCP: Nyoka Cowden, MD   Chief Complaint: Short-term memory trouble.  HPI: [name] is a 79 y.o. male  with a past medical history of ankylosing spondylitis, hypertension, hyperlipidemia, peptic ulcer disease and TIA who came to the emergency department due to confusion, difficulty finding words and short term memory trouble. Per patient and his wife, he went to physical therapy this morning and forgot the names of the two therapists that have been working with him for multiple sessions. He felt very bothered by him, because he usually has a good memory. He states that he continued to have trouble after he went home and he decided to call his wife at work. She went home and found him to be somewhat confused, forgetful of recent events and repeating things multiple times. She states that this is not like him. While we were interviewing the patient, he had some instances where he repeated the same events again. He denies slurred speech, headache, double or blurred vision, numbness, weakness or any history of head trauma or loss consciousness. CT scan is negative for acute events.   Review of Systems:  Constitutional:  No weight loss, night sweats, Fevers, chills, fatigue.  HEENT:  No headaches, Difficulty swallowing,Tooth/dental problems,Sore throat,  No sneezing, itching, ear ache, nasal congestion, post nasal drip,  Cardio-vascular:  No chest pain, Orthopnea, PND, swelling in lower extremities, anasarca, dizziness, palpitations  GI:  No heartburn, indigestion, abdominal pain, nausea, vomiting, diarrhea, change in bowel habits, loss of appetite  Resp:  No shortness of breath with exertion or at rest. No excess mucus, no productive cough, No non-productive cough, No coughing up of blood.No change in color of mucus.No wheezing.No chest  wall deformity  Skin:  no rash or lesions.  GU:  no dysuria, change in color of urine, no urgency or frequency. No flank pain.  Musculoskeletal:  No joint pain or swelling. No decreased range of motion. No back pain.  Psych:  Positive short-term memory loss.   Past Medical History  Diagnosis Date  . BENIGN PROSTATIC HYPERTROPHY, WITH OBSTRUCTION 01/15/2010  . GERD 02/22/2008  . HYPERGLYCEMIA 11/18/2007  . HYPERLIPIDEMIA 02/22/2008  . HYPERTENSION, UNSPECIFIED 11/10/2009  . NEPHROLITHIASIS, HX OF 11/17/2007  . PEPTIC ULCER DISEASE 11/17/2007  . TIA (transient ischemic attack)   . Arthritis     osteo - c-spine  . Transfusion history     bleeding ulcer   Past Surgical History  Procedure Laterality Date  . Fetal blood transfusion      from bleeding ulcer  . Cataract extraction, bilateral  2013   Social History:  reports that he quit smoking about 36 years ago. He has never used smokeless tobacco. He reports that he drinks alcohol. He reports that he does not use illicit drugs.  No Known Allergies  Family History  Problem Relation Age of Onset  . Appendicitis Mother     complications  . Colon cancer Father   . Diabetes type II Father   . Lupus Sister   . Colon cancer Other     1st degree relative <60    Prior to Admission medications   Medication Sig Start Date End Date Taking? Authorizing Provider  aspirin 81 MG tablet Take 81 mg by mouth daily.     Yes Historical Provider, MD  atorvastatin (LIPITOR) 10 MG tablet Take 1 tablet by mouth  daily  08/02/15  Yes Burtis Junes, NP  famotidine (PEPCID) 40 MG tablet Take 40 mg by mouth 2 (two) times daily.   Yes Historical Provider, MD  fish oil-omega-3 fatty acids 1000 MG capsule Take 1 g by mouth daily.    Yes Historical Provider, MD  lisinopril-hydrochlorothiazide (PRINZIDE,ZESTORETIC) 20-12.5 MG tablet Take 0.5 tablets by mouth daily. 08/02/15  Yes Burtis Junes, NP  Multiple Vitamin (MULTIVITAMIN WITH MINERALS) TABS tablet  Take 1 tablet by mouth daily.   Yes Historical Provider, MD  nitroGLYCERIN (NITROSTAT) 0.4 MG SL tablet Place 1 tablet (0.4 mg total) under the tongue every 5 (five) minutes as needed. Patient taking differently: Place 0.4 mg under the tongue every 5 (five) minutes as needed for chest pain.  08/02/15  Yes Burtis Junes, NP  triamcinolone cream (KENALOG) 0.1 % Apply 1 application topically 2 (two) times daily. Patient not taking: Reported on 08/10/2015 01/16/15   Marletta Lor, MD   Physical Exam: Filed Vitals:   08/10/15 1900 08/10/15 1915 08/10/15 1930 08/10/15 1945  BP: 129/57 161/63 174/71 181/85  Pulse: 104 117 114 117  Temp:      TempSrc:      Resp: 19 16 18 21   SpO2: 96% 98% 98% 98%    Wt Readings from Last 3 Encounters:  08/02/15 66.225 kg (146 lb)  01/16/15 66.679 kg (147 lb)  11/07/14 67.132 kg (148 lb)    General:  Appears mildly anxious. Eyes: PERRL, normal lids, irises & conjunctiva ENT: grossly normal hearing, lips & tongue Neck: no LAD, masses or thyromegaly Cardiovascular: Tachycardic, no m/r/g. No LE edema. Telemetry: Sinus tachycardia at 108 bpm  Respiratory: CTA bilaterally, no w/r/r. Normal respiratory effort. Abdomen: soft, ntnd Skin: no rash or induration seen on limited exam Musculoskeletal: grossly normal tone BUE/BLE Psychiatric: grossly normal mood and affect, speech fluent and appropriate Neurologic: Awake alert oriented 3, no cranial nerves, no sensory or motor deficits, normal nose to finger, mild intention tremor.           Labs on Admission:  Basic Metabolic Panel:  Recent Labs Lab 08/10/15 1532  NA 139  K 4.3  CL 103  CO2 27  GLUCOSE 117*  BUN 16  CREATININE 1.21  CALCIUM 10.2   Liver Function Tests:  Recent Labs Lab 08/10/15 1532  AST 27  ALT 27  ALKPHOS 53  BILITOT 2.0*  PROT 6.7  ALBUMIN 4.4   CBC:  Recent Labs Lab 08/10/15 1532  WBC 6.6  NEUTROABS 5.4  HGB 14.1  HCT 42.3  MCV 97.9  PLT 206     Radiological Exams on Admission: Dg Chest 2 View  08/10/2015  CLINICAL DATA:  One-day history of altered mental status. EXAM: CHEST  2 VIEW COMPARISON:  09/26/2013. FINDINGS: The cardiac silhouette, mediastinal and hilar contours are within normal limits and stable. There is mild tortuosity of the thoracic aorta. The lungs are clear. No pleural effusion. The bony thorax is intact. Stable degenerative changes involving the thoracic spine. IMPRESSION: No acute cardiopulmonary findings. Electronically Signed   By: Marijo Sanes M.D.   On: 08/10/2015 17:07   Ct Head Wo Contrast  08/10/2015  CLINICAL DATA:  Confusion. EXAM: CT HEAD WITHOUT CONTRAST TECHNIQUE: Contiguous axial images were obtained from the base of the skull through the vertex without intravenous contrast. COMPARISON:  11/04/2007 head CT. FINDINGS: No evidence of parenchymal hemorrhage or extra-axial fluid collection. No mass lesion, mass effect, or midline shift. No CT evidence of acute infarction.  Intracranial atherosclerosis. Nonspecific mild subcortical and periventricular white matter hypodensity, most in keeping with chronic small vessel ischemic change. Cerebral volume is age appropriate. No ventriculomegaly. The visualized paranasal sinuses are essentially clear. The mastoid air cells are unopacified. No evidence of calvarial fracture. IMPRESSION: 1. No evidence of acute intracranial abnormality. 2. Intracranial atherosclerosis and mild chronic small vessel ischemic white matter change . Electronically Signed   By: Ilona Sorrel M.D.   On: 08/10/2015 16:26    EKG: Independently reviewed. Vent. rate 116 BPM PR interval 179 ms QRS duration 87 ms QT/QTc 312/433 ms P-R-T axes 80 75 68 Sinus tachycardia Biatrial enlargement RSR' in V1 or V2, right VCD or RVH  Assessment/Plan Principal Problem:   TIA (transient ischemic attack) Admit to telemetry. Frequent neuro checks. Check fasting lipids, hemoglobin A1c, TSH and  B12. Check echocardiogram and carotid Doppler. Consult neurology. Will try to get MRI to MRA pre-medicating the patient for anxiety and his back pain secondary to his ankylosing spondylitis prior to this.  Active Problems:   Essential hypertension Continue lisinopril and hydrochlorothiazide. Monitor blood pressure.    GERD Continue famotidine 40 mg by mouth twice a day    Hyperlipidemia Continue atorvastatin 10 mg by mouth daily and monitor LFTs periodically.   Neurology was consulted.   Code Status: Full code. DVT Prophylaxis: Lovenox SQ. Family Communication: His wife was present at bedside. Disposition Plan: Admit to telemetry, consult neurology.  Time spent: Over 70 minutes were spent during the process of this admission.  Reubin Milan [facility] Pager [phone]

## 2015-08-10 NOTE — ED Notes (Signed)
Pt in MRI.

## 2015-08-10 NOTE — Progress Notes (Signed)
Attempted Report. RN will call back.

## 2015-08-10 NOTE — Consult Note (Signed)
Admission H&P    Chief Complaint: Altered mental status with confusion.  HPI: [name] is an 79 y.o. male with a history of ankylosing spondylitis, hypertension, hyperlipidemia, peptic ulcer disease and TIA, presenting with altered mental status with confusion and memory difficulty. Each and had difficulty remembering names of persons with whom he was familiar. His wife indicated that he has repeatedly asked the same questions that the sleep answered. He is also shown loss of memory for events prior to and after arriving in the emergency room. He had some difficulty with word finding. No focal weakness was noted. He's been taking aspirin 81 mg per day. CT scan of his head showed no acute intracranial abnormality. MRI of his brain is pending.  Past Medical History  Diagnosis Date  . BENIGN PROSTATIC HYPERTROPHY, WITH OBSTRUCTION 01/15/2010  . GERD 02/22/2008  . HYPERGLYCEMIA 11/18/2007  . HYPERLIPIDEMIA 02/22/2008  . HYPERTENSION, UNSPECIFIED 11/10/2009  . NEPHROLITHIASIS, HX OF 11/17/2007  . PEPTIC ULCER DISEASE 11/17/2007  . TIA (transient ischemic attack)   . Arthritis     osteo - c-spine  . Transfusion history     bleeding ulcer    Past Surgical History  Procedure Laterality Date  . Fetal blood transfusion      from bleeding ulcer  . Cataract extraction, bilateral  2013    Family History  Problem Relation Age of Onset  . Appendicitis Mother     complications  . Colon cancer Father   . Diabetes type II Father   . Lupus Sister   . Colon cancer Other     1st degree relative <60   Social History:  reports that he quit smoking about 36 years ago. He has never used smokeless tobacco. He reports that he drinks alcohol. He reports that he does not use illicit drugs.  Allergies: No Known Allergies  Medications: Patient's preadmission medications were reviewed by me.  ROS: History obtained from spouse  General ROS: negative for - chills, fatigue, fever, night sweats, weight  gain or weight loss Psychological ROS: negative for - behavioral disorder, hallucinations, memory difficulties, mood swings or suicidal ideation Ophthalmic ROS: negative for - blurry vision, double vision, eye pain or loss of vision ENT ROS: negative for - epistaxis, nasal discharge, oral lesions, sore throat, tinnitus or vertigo Allergy and Immunology ROS: negative for - hives or itchy/watery eyes Hematological and Lymphatic ROS: negative for - bleeding problems, bruising or swollen lymph nodes Endocrine ROS: negative for - galactorrhea, hair pattern changes, polydipsia/polyuria or temperature intolerance Respiratory ROS: negative for - cough, hemoptysis, shortness of breath or wheezing Cardiovascular ROS: negative for - chest pain, dyspnea on exertion, edema or irregular heartbeat Gastrointestinal ROS: negative for - abdominal pain, diarrhea, hematemesis, nausea/vomiting or stool incontinence Genito-Urinary ROS: negative for - dysuria, hematuria, incontinence or urinary frequency/urgency Musculoskeletal ROS: negative for - joint swelling or muscular weakness Neurological ROS: as noted in HPI Dermatological ROS: negative for rash and skin lesion changes  Physical Examination: Blood pressure 128/73, pulse 109, temperature 98.6 F (37 C), temperature source Oral, resp. rate 13, SpO2 98 %.  HEENT-  Normocephalic, no lesions, without obvious abnormality.  Normal external eye and conjunctiva.  Normal TM's bilaterally.  Normal auditory canals and external ears. Normal external nose, mucus membranes and septum.  Normal pharynx. Neck supple with no masses, nodes, nodules or enlargement. Cardiovascular - regular rate and rhythm, S1, S2 normal, no murmur, click, rub or gallop Lungs - chest clear, no wheezing, rales, normal symmetric  air entry Abdomen - soft, non-tender; bowel sounds normal; no masses,  no organomegaly Extremities - no joint deformities, effusion, or inflammation and no  edema  Neurologic Examination: Mental Status: Alert, oriented to time and place, no acute distress.  Short-term memory difficulty noted. Speech fluent without evidence of aphasia. Able to follow commands without difficulty. Cranial Nerves: II-Visual fields were normal. III/IV/VI-Pupils were equal and reacted normally to light. Extraocular movements were full and conjugate.    V/VII-no facial numbness and no facial weakness. VIII-normal. X-normal speech and symmetrical palatal movement. XI: trapezius strength/neck flexion strength normal bilaterally XII-midline tongue extension with normal strength. Motor: 5/5 bilaterally with normal tone and bulk Sensory: Normal throughout. Deep Tendon Reflexes: 1+ and symmetric in upper extremities and absent in lower extremities. Plantars: Mute bilaterally Cerebellar: Normal finger-to-nose testing. Carotid auscultation: Normal  Results for orders placed or performed during the hospital encounter of 08/10/15 (from the past 48 hour(s))  Comprehensive metabolic panel     Status: Abnormal   Collection Time: 08/10/15  3:32 PM  Result Value Ref Range   Sodium 139 135 - 145 mmol/L   Potassium 4.3 3.5 - 5.1 mmol/L   Chloride 103 101 - 111 mmol/L   CO2 27 22 - 32 mmol/L   Glucose, Bld 117 (H) 65 - 99 mg/dL   BUN 16 6 - 20 mg/dL   Creatinine, Ser 1.21 0.61 - 1.24 mg/dL   Calcium 10.2 8.9 - 10.3 mg/dL   Total Protein 6.7 6.5 - 8.1 g/dL   Albumin 4.4 3.5 - 5.0 g/dL   AST 27 15 - 41 U/L   ALT 27 17 - 63 U/L   Alkaline Phosphatase 53 38 - 126 U/L   Total Bilirubin 2.0 (H) 0.3 - 1.2 mg/dL   GFR calc non Af Amer 56 (L) >60 mL/min   GFR calc Af Amer >60 >60 mL/min    Comment: (NOTE) The eGFR has been calculated using the CKD EPI equation. This calculation has not been validated in all clinical situations. eGFR's persistently <60 mL/min signify possible Chronic Kidney Disease.    Anion gap 9 5 - 15  CBC with Differential     Status: None   Collection  Time: 08/10/15  3:32 PM  Result Value Ref Range   WBC 6.6 4.0 - 10.5 K/uL   RBC 4.32 4.22 - 5.81 MIL/uL   Hemoglobin 14.1 13.0 - 17.0 g/dL   HCT 42.3 39.0 - 52.0 %   MCV 97.9 78.0 - 100.0 fL   MCH 32.6 26.0 - 34.0 pg   MCHC 33.3 30.0 - 36.0 g/dL   RDW 12.9 11.5 - 15.5 %   Platelets 206 150 - 400 K/uL   Neutrophils Relative % 83 %   Neutro Abs 5.4 1.7 - 7.7 K/uL   Lymphocytes Relative 11 %   Lymphs Abs 0.8 0.7 - 4.0 K/uL   Monocytes Relative 6 %   Monocytes Absolute 0.4 0.1 - 1.0 K/uL   Eosinophils Relative 0 %   Eosinophils Absolute 0.0 0.0 - 0.7 K/uL   Basophils Relative 0 %   Basophils Absolute 0.0 0.0 - 0.1 K/uL  Urinalysis, Routine w reflex microscopic     Status: None   Collection Time: 08/10/15  3:49 PM  Result Value Ref Range   Color, Urine YELLOW YELLOW   APPearance CLEAR CLEAR   Specific Gravity, Urine 1.005 1.005 - 1.030   pH 6.0 5.0 - 8.0   Glucose, UA NEGATIVE NEGATIVE mg/dL   Hgb urine  dipstick NEGATIVE NEGATIVE   Bilirubin Urine NEGATIVE NEGATIVE   Ketones, ur NEGATIVE NEGATIVE mg/dL   Protein, ur NEGATIVE NEGATIVE mg/dL   Nitrite NEGATIVE NEGATIVE   Leukocytes, UA NEGATIVE NEGATIVE    Comment: MICROSCOPIC NOT DONE ON URINES WITH NEGATIVE PROTEIN, BLOOD, LEUKOCYTES, NITRITE, OR GLUCOSE <1000 mg/dL.  I-stat troponin, ED     Status: None   Collection Time: 08/10/15  3:53 PM  Result Value Ref Range   Troponin i, poc 0.00 0.00 - 0.08 ng/mL   Comment 3            Comment: Due to the release kinetics of cTnI, a negative result within the first hours of the onset of symptoms does not rule out myocardial infarction with certainty. If myocardial infarction is still suspected, repeat the test at appropriate intervals.    Dg Chest 2 View  08/10/2015  CLINICAL DATA:  One-day history of altered mental status. EXAM: CHEST  2 VIEW COMPARISON:  09/26/2013. FINDINGS: The cardiac silhouette, mediastinal and hilar contours are within normal limits and stable. There is  mild tortuosity of the thoracic aorta. The lungs are clear. No pleural effusion. The bony thorax is intact. Stable degenerative changes involving the thoracic spine. IMPRESSION: No acute cardiopulmonary findings. Electronically Signed   By: Marijo Sanes M.D.   On: 08/10/2015 17:07   Ct Head Wo Contrast  08/10/2015  CLINICAL DATA:  Confusion. EXAM: CT HEAD WITHOUT CONTRAST TECHNIQUE: Contiguous axial images were obtained from the base of the skull through the vertex without intravenous contrast. COMPARISON:  11/04/2007 head CT. FINDINGS: No evidence of parenchymal hemorrhage or extra-axial fluid collection. No mass lesion, mass effect, or midline shift. No CT evidence of acute infarction. Intracranial atherosclerosis. Nonspecific mild subcortical and periventricular white matter hypodensity, most in keeping with chronic small vessel ischemic change. Cerebral volume is age appropriate. No ventriculomegaly. The visualized paranasal sinuses are essentially clear. The mastoid air cells are unopacified. No evidence of calvarial fracture. IMPRESSION: 1. No evidence of acute intracranial abnormality. 2. Intracranial atherosclerosis and mild chronic small vessel ischemic white matter change . Electronically Signed   By: Ilona Sorrel M.D.   On: 08/10/2015 16:26    Assessment/Plan 79 year old man presenting with altered mental status with confusion and short-term memory difficulty with features suggestive of transient global amnesia, but significantly less profound than is typical for TGA. TIA somewhat less likely but cannot be completely ruled out.  Recommendations: 1. Continue aspirin 81 mg per day 2. Stroke risk assessment, including MRI and MRA of the brain, carotid Doppler, echocardiogram, hemoglobin A1c and fasting lipid panel. 3. EEG routine adult study X  We will continue to follow this patient with you.  C.R. Nicole Kindred, [place] [facility] [phone]  08/10/2015, 8:50 PM

## 2015-08-10 NOTE — ED Notes (Addendum)
Per EMS, Patient is having increased trouble recalling past occurences. Pt was at rehab for neurological changes and started to have trouble remembering past exercises and people's names. Wife reports this has been going on since 0900. EMS reported pt has no stroke deficits. Reports no pain. Pt has Hx of HTN. Vitals per EMS: 203/112, 114 ST. CBG 145

## 2015-08-10 NOTE — ED Notes (Signed)
MD at bedside. 

## 2015-08-10 NOTE — ED Notes (Signed)
Pt returned from X-ray.  

## 2015-08-10 NOTE — Progress Notes (Signed)
Received report from Rod Holler, RN in ED for transfer of pt to [phone].

## 2015-08-11 ENCOUNTER — Inpatient Hospital Stay (HOSPITAL_COMMUNITY): Payer: Medicare Other

## 2015-08-11 DIAGNOSIS — F05 Delirium due to known physiological condition: Secondary | ICD-10-CM

## 2015-08-11 DIAGNOSIS — I10 Essential (primary) hypertension: Secondary | ICD-10-CM

## 2015-08-11 LAB — COMPREHENSIVE METABOLIC PANEL
ALT: 22 U/L (ref 17–63)
AST: 21 U/L (ref 15–41)
Albumin: 3.6 g/dL (ref 3.5–5.0)
Alkaline Phosphatase: 46 U/L (ref 38–126)
Anion gap: 7 (ref 5–15)
BUN: 15 mg/dL (ref 6–20)
CO2: 28 mmol/L (ref 22–32)
Calcium: 9.3 mg/dL (ref 8.9–10.3)
Chloride: 104 mmol/L (ref 101–111)
Creatinine, Ser: 1.16 mg/dL (ref 0.61–1.24)
GFR calc Af Amer: >60 mL/min (ref >60)
GFR calc non Af Amer: 59 mL/min — ABNORMAL LOW (ref >60)
Glucose, Bld: 90 mg/dL (ref 65–99)
Potassium: 3.8 mmol/L (ref 3.5–5.1)
Sodium: 139 mmol/L (ref 135–145)
Total Bilirubin: 2.2 mg/dL — ABNORMAL HIGH (ref 0.3–1.2)
Total Protein: 5.8 g/dL — ABNORMAL LOW (ref 6.5–8.1)

## 2015-08-11 LAB — CBC
HCT: 37.9 % — ABNORMAL LOW (ref 39.0–52.0)
Hemoglobin: 12.7 g/dL — ABNORMAL LOW (ref 13.0–17.0)
MCH: 33.2 pg (ref 26.0–34.0)
MCHC: 33.5 g/dL (ref 30.0–36.0)
MCV: 99 fL (ref 78.0–100.0)
Platelets: 178 10*3/uL (ref 150–400)
RBC: 3.83 MIL/uL — ABNORMAL LOW (ref 4.22–5.81)
RDW: 13.2 % (ref 11.5–15.5)
WBC: 5.1 10*3/uL (ref 4.0–10.5)

## 2015-08-11 LAB — LIPID PANEL
Cholesterol: 121 mg/dL (ref 0–200)
HDL: 64 mg/dL (ref >40)
LDL Cholesterol: 48 mg/dL (ref 0–99)
Total CHOL/HDL Ratio: 1.9 RATIO
Triglycerides: 43 mg/dL (ref <150)
VLDL: 9 mg/dL (ref 0–40)

## 2015-08-11 LAB — MAGNESIUM: Magnesium: 1.9 mg/dL (ref 1.7–2.4)

## 2015-08-11 LAB — TSH: TSH: 6.699 u[IU]/mL — ABNORMAL HIGH (ref 0.350–4.500)

## 2015-08-11 LAB — VITAMIN B12: Vitamin B-12: 621 pg/mL (ref 180–914)

## 2015-08-11 MED ORDER — SODIUM CHLORIDE 0.9 % IV SOLN
INTRAVENOUS | Status: DC
  Administered 2015-08-11 – 2015-08-12 (×2): via INTRAVENOUS

## 2015-08-11 MED ORDER — SODIUM CHLORIDE 0.9 % IV BOLUS (SEPSIS)
500.0000 mL | Freq: Once | INTRAVENOUS | Status: AC
  Administered 2015-08-11: 500 mL via INTRAVENOUS

## 2015-08-11 NOTE — Progress Notes (Signed)
Echocardiogram 2D Echocardiogram has been performed.  Tresa Res 08/11/2015, 10:54 AM

## 2015-08-11 NOTE — Progress Notes (Signed)
Subjective: Back to baseline.   Exam: Filed Vitals:   08/11/15 0615 08/11/15 0800  BP: 100/55 112/61  Pulse: 75 83  Temp: 97.6 F (36.4 C) 98.3 F (36.8 C)  Resp: 16 16    HEENT-  Normocephalic, no lesions, without obvious abnormality.  Normal external eye and conjunctiva.  Normal TM's bilaterally.  Normal auditory canals and external ears. Normal external nose, mucus membranes and septum.  Normal pharynx. Cardiovascular- S1, S2 normal, pulses palpable throughout   Lungs- chest clear, no wheezing, rales, normal symmetric air entry Abdomen- normal findings: bowel sounds normal Extremities- no edema     Gen: In bed, NAD MS: alert and oriented, speech clear, follows commands.  CN: grossly intact, PERRLA, EOMI, TML, face symmetrical, vision intact.  Motor: moving all extremities 5/5 Sensory: intact throughout DTR: 1+ in UE and KJ no AJ  Pertinent Labs: MRI brain is WNL/MRA brain WNL ECHO/Carotid/ LDL/A1c pending   Impression: Transient global amnesia most likely.  Will finish TIA work up to be complete.    Recommendations: 1) EEG, Carotid doppler, Echo, A1c, LDL 2) if above studies normal no further neurology recommendations and neurology will S/O  Etta Quill PA-C [facility] [phone]   08/11/2015, 8:54 AM Patient seen and examined together with physician assistant and I concur with the assessment and plan.  Dorian Pod, MD

## 2015-08-11 NOTE — Evaluation (Signed)
Speech Language Pathology Evaluation Patient Details Name: [name] MRN: [id number] DOB: 01/03/1936 Today's Date: 08/11/2015 Time: [id number] SLP Time Calculation (min) (ACUTE ONLY): 28 min  Problem List:  Patient Active Problem List   Diagnosis Date Noted  . Acute confusional state   . TIA (transient ischemic attack) 08/10/2015  . Chest pain 08/04/2014  . Carotid stenosis 08/04/2014  . Hyperlipidemia 02/21/2012  . BENIGN PROSTATIC HYPERTROPHY, WITH OBSTRUCTION 01/15/2010  . Essential hypertension 11/10/2009  . GERD 02/22/2008  . NEPHROLITHIASIS, HX OF 11/17/2007   Past Medical History:  Past Medical History  Diagnosis Date  . BENIGN PROSTATIC HYPERTROPHY, WITH OBSTRUCTION 01/15/2010  . GERD 02/22/2008  . HYPERGLYCEMIA 11/18/2007  . HYPERLIPIDEMIA 02/22/2008  . HYPERTENSION, UNSPECIFIED 11/10/2009  . PEPTIC ULCER DISEASE 11/17/2007  . TIA (transient ischemic attack)     "not that I know of in the past; they are trying to determine if I've had one today (08/10/2015)"  . History of blood transfusion 1988    "lost ~ 1/2 of my blood volume from multiple bleeding ulcers"  . Bleeding stomach ulcer   . Bleeding esophageal ulcer   . Bleeding duodenal ulcer   . Kidney stones   . History of hiatal hernia   . Osteoarthritis     c-spine  . Arthritis     "back" (08/10/2015)  . Ankylosing spondylitis ([place]) dx'd ~ 1974  . Situational depression     "son died in [place] 11-Jun-2015"   Past Surgical History:  Past Surgical History  Procedure Laterality Date  . Cataract extraction w/ intraocular lens  implant, bilateral Bilateral 2013   HPI:  79 y.o. male with a past medical history of ankylosing spondylitis, GERD, hypertension, hyperlipidemia, peptic ulcer disease and TIA who came to the emergency department due to confusion, difficulty finding words and short term memory trouble. MRI negative brain MRI with no acute intracranial infarct or other process identified. Pt reports episodes of  sudden memory loss.   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 He states he has episodes of being unable to recall familiar information (declarative or episodic memory). SLP administerd the Cognistat Neuro which he scored within average range on all subtest except memory which fell into the mild impairment range. Once cued with a semantic or choice cue pt recalled word from list. SLP printed information re: TGA for pt and educated/provided written instructions on memory strategies to implement. Wife not present at time of evaluation. Pt receiving PT currently in [place]. Follow up Speech Pathology would be helpful, however not crucial at discharge. Case manager stated she would inquire about ST in [place](this SLP suspects they do not offer).      SLP Assessment  All further Speech Lanaguage Pathology  needs can be addressed in the next venue of care    Follow Up Recommendations   (outpt is available)    Frequency and Duration           SLP Evaluation Prior Functioning  Cognitive/Linguistic Baseline: Baseline deficits Baseline deficit details:  (intermittent difficulty with episodic memory)  Lives With: Spouse Vocation: Retired   Associate Professor  Overall Cognitive Status: History of cognitive impairments - at baseline Arousal/Alertness: Awake/alert Orientation Level: Oriented X4 Attention: Sustained Sustained Attention: Appears intact Memory: Impaired Memory Impairment: Decreased recall of new information;Retrieval deficit Awareness: Appears intact Problem Solving: Appears intact Safety/Judgment: Appears intact    Comprehension  Auditory Comprehension Overall Auditory Comprehension: Appears within functional  limits for tasks assessed Commands: Within Functional Limits Conversation: Complex Interfering Components: Working Curator: Not tested Reading  Comprehension Reading Status: Not tested    Expression Expression Primary Mode of Expression: Verbal Verbal Expression Overall Verbal Expression: Appears within functional limits for tasks assessed Initiation: No impairment Level of Generative/Spontaneous Verbalization: Conversation Repetition: No impairment Naming: No impairment Pragmatics: No impairment Written Expression Dominant Hand: Right Written Expression: Not tested   Oral / Motor Oral Motor/Sensory Function Overall Oral Motor/Sensory Function: Within functional limits Motor Speech Overall Motor Speech: Appears within functional limits for tasks assessed Respiration: Within functional limits Phonation: Normal Resonance: Within functional limits Articulation: Within functional limitis Intelligibility: Intelligible Motor Planning: Witnin functional limits    [name] 08/11/2015, 2:21 PM  [name] M.Ed [employer] [phone]

## 2015-08-11 NOTE — Progress Notes (Signed)
[facility] PROGRESS NOTE  SEDDRICK MORASKI [id number] DOB: Feb 08, 1936 DOA: 08/10/2015 PCP: Nyoka Cowden, MD  Length of stay: 1   Assessment/Plan: Principal Problem:   TIA (transient ischemic attack) Active Problems:   Essential hypertension   GERD   Hyperlipidemia   Carotid stenosis   Acute confusional state   HPI 79 y.o. male with a history of ankylosing spondylitis, hypertension, hyperlipidemia, peptic ulcer disease and TIA, presenting with altered mental status with confusion and memory difficulty. Each and had difficulty remembering names of persons with whom he was familiar. His wife indicated that he has repeatedly asked the same questions that the sleep answered. He is also shown loss of memory for events prior to and after arriving in the emergency room. He had some difficulty with word finding. No focal weakness was noted. He's been taking aspirin 81 mg per day. CT scan of his head showed no acute intracranial abnormality. MRI of his brain is pending.   TIA (transient ischemic attack) Telemetry NSR, EEG pending,echo pending,MRI negative  Stable  neuro checks. Nl  fasting lipids, hemoglobin A1c, TSH  6.7 and B12 621. Neurology follow up.  PT eval pending    :  Essential hypertension Continue lisinopril and DC  Hydrochlorothiazide,soft BP. Monitor blood pressure overnight    GERD Continue famotidine 40 mg by mouth twice a day   Hyperlipidemia Continue atorvastatin 10 mg by mouth daily and monitor LFTs periodically.       DVT prophylaxsis lovenox   Code Status:      Code Status Orders        Start     Ordered   08/10/15 2314  Full code   Continuous     08/10/15 2313    Advance Directive Documentation        Most Recent Value   Type of Advance Directive  Healthcare Power of Attorney, Living will   Pre-existing out of facility DNR order (yellow form or pink MOST form)     "MOST" Form in Place?        Family Communication:  Discussed in detail with the patient, all imaging results, lab results explained to the patient   Disposition Plan:  In am      Consultants:  neurology  Procedures:  None   Antibiotics: Anti-infectives    None         HPI/Subjective: Back to baseline  Objective: Filed Vitals:   08/11/15 0615 08/11/15 0800 08/11/15 1020 08/11/15 1022  BP: 100/55 112/61 106/53 108/58  Pulse: 75 83 84   Temp: 97.6 F (36.4 C) 98.3 F (36.8 C) 97.8 F (36.6 C)   TempSrc: Oral Oral Oral   Resp: 16 16 18    Height:      Weight:      SpO2: 100% 98% 100%     Intake/Output Summary (Last 24 hours) at 08/11/15 1251 Last data filed at 08/11/15 1249  Gross per 24 hour  Intake    363 ml  Output   1000 ml  Net   -637 ml    Exam:  General: No acute respiratory distress Lungs: Clear to auscultation bilaterally without wheezes or crackles Cardiovascular: Regular rate and rhythm without murmur gallop or rub normal S1 and S2 Abdomen: Nontender, nondistended, soft, bowel sounds positive, no rebound, no ascites, no appreciable mass Extremities: No significant cyanosis, clubbing, or edema bilateral lower extremities     Data Review   Micro Results No results found for this  or any previous visit (from the past 240 hour(s)).  Radiology Reports Dg Chest 2 View  08/10/2015  CLINICAL DATA:  One-day history of altered mental status. EXAM: CHEST  2 VIEW COMPARISON:  09/26/2013. FINDINGS: The cardiac silhouette, mediastinal and hilar contours are within normal limits and stable. There is mild tortuosity of the thoracic aorta. The lungs are clear. No pleural effusion. The bony thorax is intact. Stable degenerative changes involving the thoracic spine. IMPRESSION: No acute cardiopulmonary findings. Electronically Signed   By: Marijo Sanes M.D.   On: 08/10/2015 17:07   Ct Head Wo Contrast  08/10/2015  CLINICAL DATA:  Confusion. EXAM: CT HEAD WITHOUT CONTRAST TECHNIQUE: Contiguous axial images  were obtained from the base of the skull through the vertex without intravenous contrast. COMPARISON:  11/04/2007 head CT. FINDINGS: No evidence of parenchymal hemorrhage or extra-axial fluid collection. No mass lesion, mass effect, or midline shift. No CT evidence of acute infarction. Intracranial atherosclerosis. Nonspecific mild subcortical and periventricular white matter hypodensity, most in keeping with chronic small vessel ischemic change. Cerebral volume is age appropriate. No ventriculomegaly. The visualized paranasal sinuses are essentially clear. The mastoid air cells are unopacified. No evidence of calvarial fracture. IMPRESSION: 1. No evidence of acute intracranial abnormality. 2. Intracranial atherosclerosis and mild chronic small vessel ischemic white matter change . Electronically Signed   By: Ilona Sorrel M.D.   On: 08/10/2015 16:26   Mr Jodene Nam Head Wo Contrast  08/10/2015  CLINICAL DATA:  Initial evaluation for acute confusion, memory difficulty. EXAM: MRI HEAD WITHOUT CONTRAST MRA HEAD WITHOUT CONTRAST TECHNIQUE: Multiplanar, multiecho pulse sequences of the brain and surrounding structures were obtained without intravenous contrast. Angiographic images of the head were obtained using MRA technique without contrast. COMPARISON:  Prior CT from earlier the same day. FINDINGS: MRI HEAD FINDINGS Age appropriate cerebral atrophy present. No significant white matter disease present for patient age. No areas of chronic infarction. No abnormal foci of restricted diffusion to suggest acute intracranial infarct. Gray-white matter differentiation maintained. Normal intravascular flow voids are preserved. No acute or chronic intracranial hemorrhage. No mass lesion, midline shift, or mass effect. No hydrocephalus. No extra-axial fluid collection. Craniocervical junction within normal limits. Mild thickening of the tectorial membrane noted without stenosis. Pituitary gland normal. No acute abnormality about  the orbits. Sequela prior bilateral lens extraction noted. Axial mild myopia noted. Paranasal sinuses are clear. Mild scattered opacity within the left mastoid air cells. Right mastoid air cells are clear. Inner ear structures normal. Bone marrow signal intensity within normal limits. No scalp soft tissue abnormality. MRA HEAD FINDINGS ANTERIOR CIRCULATION: Visualized distal cervical segments of the internal carotid arteries are patent with antegrade flow. Petrous, cavernous, and supraclinoid segments are widely patent bilaterally. A1 segments, anterior communicating artery, and anterior cerebral arteries well opacified. M1 segments widely patent without stenosis or occlusion. MCA bifurcations normal. Distal MCA branches well opacified. POSTERIOR CIRCULATION: Vertebral arteries patent to the vertebrobasilar junction. Posterior inferior cerebral arteries patent. Basilar artery well opacified. Superior cerebral arteries and posterior cerebral arteries widely patent. No aneurysm or vascular malformation. IMPRESSION: MRI HEAD IMPRESSION: Negative brain MRI with no acute intracranial infarct or other process identified. MRA HEAD IMPRESSION: Negative intracranial MRA. No large or proximal arterial branch occlusion. No high-grade or correctable stenosis. Electronically Signed   By: Jeannine Boga M.D.   On: 08/10/2015 23:33   Mr Brain Wo Contrast  08/10/2015  CLINICAL DATA:  Initial evaluation for acute confusion, memory difficulty. EXAM: MRI HEAD WITHOUT CONTRAST MRA  HEAD WITHOUT CONTRAST TECHNIQUE: Multiplanar, multiecho pulse sequences of the brain and surrounding structures were obtained without intravenous contrast. Angiographic images of the head were obtained using MRA technique without contrast. COMPARISON:  Prior CT from earlier the same day. FINDINGS: MRI HEAD FINDINGS Age appropriate cerebral atrophy present. No significant white matter disease present for patient age. No areas of chronic infarction.  No abnormal foci of restricted diffusion to suggest acute intracranial infarct. Gray-white matter differentiation maintained. Normal intravascular flow voids are preserved. No acute or chronic intracranial hemorrhage. No mass lesion, midline shift, or mass effect. No hydrocephalus. No extra-axial fluid collection. Craniocervical junction within normal limits. Mild thickening of the tectorial membrane noted without stenosis. Pituitary gland normal. No acute abnormality about the orbits. Sequela prior bilateral lens extraction noted. Axial mild myopia noted. Paranasal sinuses are clear. Mild scattered opacity within the left mastoid air cells. Right mastoid air cells are clear. Inner ear structures normal. Bone marrow signal intensity within normal limits. No scalp soft tissue abnormality. MRA HEAD FINDINGS ANTERIOR CIRCULATION: Visualized distal cervical segments of the internal carotid arteries are patent with antegrade flow. Petrous, cavernous, and supraclinoid segments are widely patent bilaterally. A1 segments, anterior communicating artery, and anterior cerebral arteries well opacified. M1 segments widely patent without stenosis or occlusion. MCA bifurcations normal. Distal MCA branches well opacified. POSTERIOR CIRCULATION: Vertebral arteries patent to the vertebrobasilar junction. Posterior inferior cerebral arteries patent. Basilar artery well opacified. Superior cerebral arteries and posterior cerebral arteries widely patent. No aneurysm or vascular malformation. IMPRESSION: MRI HEAD IMPRESSION: Negative brain MRI with no acute intracranial infarct or other process identified. MRA HEAD IMPRESSION: Negative intracranial MRA. No large or proximal arterial branch occlusion. No high-grade or correctable stenosis. Electronically Signed   By: Jeannine Boga M.D.   On: 08/10/2015 23:33     CBC  Recent Labs Lab 08/10/15 1532 08/11/15 0530  WBC 6.6 5.1  HGB 14.1 12.7*  HCT 42.3 37.9*  PLT 206 178   MCV 97.9 99.0  MCH 32.6 33.2  MCHC 33.3 33.5  RDW 12.9 13.2  LYMPHSABS 0.8  --   MONOABS 0.4  --   EOSABS 0.0  --   BASOSABS 0.0  --     Chemistries   Recent Labs Lab 08/10/15 0026 08/10/15 1532 08/11/15 0530  NA  --  139 139  K  --  4.3 3.8  CL  --  103 104  CO2  --  27 28  GLUCOSE  --  117* 90  BUN  --  16 15  CREATININE  --  1.21 1.16  CALCIUM  --  10.2 9.3  MG 1.9  --   --   AST  --  27 21  ALT  --  27 22  ALKPHOS  --  53 46  BILITOT  --  2.0* 2.2*   ------------------------------------------------------------------------------------------------------------------ estimated creatinine clearance is 48 mL/min (by C-G formula based on Cr of 1.16). ------------------------------------------------------------------------------------------------------------------ No results for input(s): HGBA1C in the last 72 hours. ------------------------------------------------------------------------------------------------------------------  Recent Labs  08/11/15 1045  CHOL 121  HDL 64  LDLCALC 48  TRIG 43  CHOLHDL 1.9   ------------------------------------------------------------------------------------------------------------------  Recent Labs  08/11/15 0700  TSH 6.699*   ------------------------------------------------------------------------------------------------------------------  Recent Labs  08/11/15 0700  VITAMINB12 621    Coagulation profile No results for input(s): INR, PROTIME in the last 168 hours.  No results for input(s): DDIMER in the last 72 hours.  Cardiac Enzymes No results for input(s): CKMB, TROPONINI, MYOGLOBIN in the last  168 hours.  Invalid input(s): CK ------------------------------------------------------------------------------------------------------------------ Invalid input(s): POCBNP   CBG: No results for input(s): GLUCAP in the last 168 hours.     Studies: Dg Chest 2 View  08/10/2015  CLINICAL DATA:  One-day  history of altered mental status. EXAM: CHEST  2 VIEW COMPARISON:  09/26/2013. FINDINGS: The cardiac silhouette, mediastinal and hilar contours are within normal limits and stable. There is mild tortuosity of the thoracic aorta. The lungs are clear. No pleural effusion. The bony thorax is intact. Stable degenerative changes involving the thoracic spine. IMPRESSION: No acute cardiopulmonary findings. Electronically Signed   By: Marijo Sanes M.D.   On: 08/10/2015 17:07   Ct Head Wo Contrast  08/10/2015  CLINICAL DATA:  Confusion. EXAM: CT HEAD WITHOUT CONTRAST TECHNIQUE: Contiguous axial images were obtained from the base of the skull through the vertex without intravenous contrast. COMPARISON:  11/04/2007 head CT. FINDINGS: No evidence of parenchymal hemorrhage or extra-axial fluid collection. No mass lesion, mass effect, or midline shift. No CT evidence of acute infarction. Intracranial atherosclerosis. Nonspecific mild subcortical and periventricular white matter hypodensity, most in keeping with chronic small vessel ischemic change. Cerebral volume is age appropriate. No ventriculomegaly. The visualized paranasal sinuses are essentially clear. The mastoid air cells are unopacified. No evidence of calvarial fracture. IMPRESSION: 1. No evidence of acute intracranial abnormality. 2. Intracranial atherosclerosis and mild chronic small vessel ischemic white matter change . Electronically Signed   By: Ilona Sorrel M.D.   On: 08/10/2015 16:26   Mr Jodene Nam Head Wo Contrast  08/10/2015  CLINICAL DATA:  Initial evaluation for acute confusion, memory difficulty. EXAM: MRI HEAD WITHOUT CONTRAST MRA HEAD WITHOUT CONTRAST TECHNIQUE: Multiplanar, multiecho pulse sequences of the brain and surrounding structures were obtained without intravenous contrast. Angiographic images of the head were obtained using MRA technique without contrast. COMPARISON:  Prior CT from earlier the same day. FINDINGS: MRI HEAD FINDINGS Age  appropriate cerebral atrophy present. No significant white matter disease present for patient age. No areas of chronic infarction. No abnormal foci of restricted diffusion to suggest acute intracranial infarct. Gray-white matter differentiation maintained. Normal intravascular flow voids are preserved. No acute or chronic intracranial hemorrhage. No mass lesion, midline shift, or mass effect. No hydrocephalus. No extra-axial fluid collection. Craniocervical junction within normal limits. Mild thickening of the tectorial membrane noted without stenosis. Pituitary gland normal. No acute abnormality about the orbits. Sequela prior bilateral lens extraction noted. Axial mild myopia noted. Paranasal sinuses are clear. Mild scattered opacity within the left mastoid air cells. Right mastoid air cells are clear. Inner ear structures normal. Bone marrow signal intensity within normal limits. No scalp soft tissue abnormality. MRA HEAD FINDINGS ANTERIOR CIRCULATION: Visualized distal cervical segments of the internal carotid arteries are patent with antegrade flow. Petrous, cavernous, and supraclinoid segments are widely patent bilaterally. A1 segments, anterior communicating artery, and anterior cerebral arteries well opacified. M1 segments widely patent without stenosis or occlusion. MCA bifurcations normal. Distal MCA branches well opacified. POSTERIOR CIRCULATION: Vertebral arteries patent to the vertebrobasilar junction. Posterior inferior cerebral arteries patent. Basilar artery well opacified. Superior cerebral arteries and posterior cerebral arteries widely patent. No aneurysm or vascular malformation. IMPRESSION: MRI HEAD IMPRESSION: Negative brain MRI with no acute intracranial infarct or other process identified. MRA HEAD IMPRESSION: Negative intracranial MRA. No large or proximal arterial branch occlusion. No high-grade or correctable stenosis. Electronically Signed   By: Jeannine Boga M.D.   On: 08/10/2015  23:33   Mr Brain Wo Contrast  08/10/2015  CLINICAL DATA:  Initial evaluation for acute confusion, memory difficulty. EXAM: MRI HEAD WITHOUT CONTRAST MRA HEAD WITHOUT CONTRAST TECHNIQUE: Multiplanar, multiecho pulse sequences of the brain and surrounding structures were obtained without intravenous contrast. Angiographic images of the head were obtained using MRA technique without contrast. COMPARISON:  Prior CT from earlier the same day. FINDINGS: MRI HEAD FINDINGS Age appropriate cerebral atrophy present. No significant white matter disease present for patient age. No areas of chronic infarction. No abnormal foci of restricted diffusion to suggest acute intracranial infarct. Gray-white matter differentiation maintained. Normal intravascular flow voids are preserved. No acute or chronic intracranial hemorrhage. No mass lesion, midline shift, or mass effect. No hydrocephalus. No extra-axial fluid collection. Craniocervical junction within normal limits. Mild thickening of the tectorial membrane noted without stenosis. Pituitary gland normal. No acute abnormality about the orbits. Sequela prior bilateral lens extraction noted. Axial mild myopia noted. Paranasal sinuses are clear. Mild scattered opacity within the left mastoid air cells. Right mastoid air cells are clear. Inner ear structures normal. Bone marrow signal intensity within normal limits. No scalp soft tissue abnormality. MRA HEAD FINDINGS ANTERIOR CIRCULATION: Visualized distal cervical segments of the internal carotid arteries are patent with antegrade flow. Petrous, cavernous, and supraclinoid segments are widely patent bilaterally. A1 segments, anterior communicating artery, and anterior cerebral arteries well opacified. M1 segments widely patent without stenosis or occlusion. MCA bifurcations normal. Distal MCA branches well opacified. POSTERIOR CIRCULATION: Vertebral arteries patent to the vertebrobasilar junction. Posterior inferior cerebral  arteries patent. Basilar artery well opacified. Superior cerebral arteries and posterior cerebral arteries widely patent. No aneurysm or vascular malformation. IMPRESSION: MRI HEAD IMPRESSION: Negative brain MRI with no acute intracranial infarct or other process identified. MRA HEAD IMPRESSION: Negative intracranial MRA. No large or proximal arterial branch occlusion. No high-grade or correctable stenosis. Electronically Signed   By: Jeannine Boga M.D.   On: 08/10/2015 23:33      Lab Results  Component Value Date   HGBA1C  09/12/2010    5.6 (NOTE)                                                                       According to the ADA Clinical Practice Recommendations for 2011, when HbA1c is used as a screening test:   >=6.5%   Diagnostic of Diabetes Mellitus           (if abnormal result  is confirmed)  5.7-6.4%   Increased risk of developing Diabetes Mellitus  References:Diagnosis and Classification of Diabetes Mellitus,Diabetes [id number] 1):S62-S69 and Standards of Medical Care in         Diabetes - 2011,Diabetes [id number]  (Suppl 1):S11-S61.   HGBA1C 5.5 06/22/2010   HGBA1C 5.5 09/07/2009   Lab Results  Component Value Date   LDLCALC 48 08/11/2015   CREATININE 1.16 08/11/2015       Scheduled Meds: . aspirin EC  325 mg Oral Daily  . atorvastatin  10 mg Oral q1800  . enoxaparin (LOVENOX) injection  40 mg Subcutaneous QHS  . famotidine  40 mg Oral BID  . lisinopril  10 mg Oral Daily   And  . hydrochlorothiazide  6.25 mg Oral Daily  . ketorolac  15 mg Intravenous Once  . multivitamin with minerals  1 tablet Oral Daily  . omega-3 acid ethyl esters  1 g Oral Daily  . pantoprazole  40 mg Oral Daily  . sodium chloride  3 mL Intravenous Q12H   Continuous Infusions:   Principal Problem:   TIA (transient ischemic attack) Active Problems:   Essential hypertension   GERD   Hyperlipidemia   Carotid stenosis   Acute confusional state    Time spent: 45  minutes   [facility] Pager [phone]. If 7PM-7AM, please contact night-coverage at [email or web address], password [facility] 08/11/2015, 12:51 PM  LOS: 1 day

## 2015-08-11 NOTE — Progress Notes (Signed)
EEG completed; results pending.    

## 2015-08-11 NOTE — Progress Notes (Addendum)
MD paged about orthostatic VS. Supine 99/55 HR 89 Sitting 83/54 HR 90. Nurse [employer] stated she did not feel comfortable having patient stand for last set of BP check. Patient denied feeling associated symptoms. Patient had recently walked hall with physical therapy and patient stated he did not feel dizzy on ambulation either. Dr. Allyson Sabal made aware.   3:46 PM Orders placed

## 2015-08-11 NOTE — Evaluation (Signed)
Physical Therapy Evaluation Patient Details Name: [name] MRN: [id number] DOB: 01-09-1936 Today's Date: 08/11/2015   History of Present Illness  Pt adm with acute confusion which is now resolved. MRI negative. Pt with possible TIA.  Clinical Impression  Pt at baseline with mobility. No further acute therapy needed.    Follow Up Recommendations Outpatient PT (resume in [place])    Equipment Recommendations       Recommendations for Other Services       Precautions / Restrictions Precautions Precautions: Fall      Mobility  Bed Mobility Overal bed mobility: Modified Independent                Transfers Overall transfer level: Modified independent                  Ambulation/Gait Ambulation/Gait assistance: Supervision Ambulation Distance (Feet): 1000 Feet Assistive device: None Gait Pattern/deviations: Step-through pattern;Decreased step length - left     General Gait Details: Steady gait and no loss of balance  Stairs            Wheelchair Mobility    Modified Rankin (Stroke Patients Only)       Balance Overall balance assessment: Needs assistance         Standing balance support: No upper extremity supported;During functional activity Standing balance-Leahy Scale: Good                               Pertinent Vitals/Pain Pain Assessment: No/denies pain    Home Living Family/patient expects to be discharged to:: Private residence Living Arrangements: Spouse/significant other Available Help at Discharge: Family Type of Home: House Home Access: Stairs to enter     Home Layout: Multi-level;Laundry or work area in basement        Prior Function Level of Independence: Independent         Comments: Participating in [place] for his ankylosing spondylosis     Hand Dominance   Dominant Hand: Right    Extremity/Trunk Assessment   Upper Extremity Assessment: Defer to OT evaluation           Lower  Extremity Assessment: Overall WFL for tasks assessed         Communication   Communication: No difficulties  Cognition Arousal/Alertness: Awake/alert Behavior During Therapy: WFL for tasks assessed/performed Overall Cognitive Status: Within Functional Limits for tasks assessed                      General Comments      Exercises        Assessment/Plan    PT Assessment All further PT needs can be met in the next venue of care  PT Diagnosis Abnormality of gait   PT Problem List Decreased balance;Decreased mobility  PT Treatment Interventions     PT Goals (Current goals can be found in the Care Plan section) Acute Rehab PT Goals PT Goal Formulation: All assessment and education complete, DC therapy    Frequency     Barriers to discharge        Co-evaluation               End of Session   Activity Tolerance: Patient tolerated treatment well Patient left: in bed;with call bell/phone within reach;with family/visitor present Nurse Communication: Mobility status         Time: 1415-1435 PT Time Calculation (min) (ACUTE ONLY): 20 min   Charges:  PT Evaluation $Initial PT Evaluation Tier I: 1 Procedure     PT G Codes:        Sarahi Borland September 09, 2015, 3:05 PM [facility] PT [phone]

## 2015-08-11 NOTE — Progress Notes (Signed)
Utilization review completed. Rosha Cocker, RN, BSN. 

## 2015-08-11 NOTE — Progress Notes (Signed)
NURSING PROGRESS NOTE  [name] [id number] Admission Data: 08/10/15 11:13 PM Attending Provider: Reubin Milan, MD [id number] Pilar Plate, MD Code Status: Full  Allergies:  Review of patient's allergies indicates no known allergies. Past Medical History:   has a past medical history of BENIGN PROSTATIC HYPERTROPHY, WITH OBSTRUCTION (01/15/2010); GERD (02/22/2008); HYPERGLYCEMIA (11/18/2007); HYPERLIPIDEMIA (02/22/2008); HYPERTENSION, UNSPECIFIED (11/10/2009); PEPTIC ULCER DISEASE (11/17/2007); TIA (transient ischemic attack); History of blood transfusion (1988); Bleeding stomach ulcer; Bleeding esophageal ulcer; Bleeding duodenal ulcer; Kidney stones; History of hiatal hernia; Osteoarthritis; Arthritis; Ankylosing spondylitis ([place]) (dx'd ~ 1974); and Situational depression. Past Surgical History:   has past surgical history that includes Cataract extraction w/ intraocular lens  implant, bilateral (Bilateral, 2013). Social History:   reports that he quit smoking about 36 years ago. His smoking use included Cigars and Pipe. He has never used smokeless tobacco. He reports that he drinks alcohol. He reports that he does not use illicit drugs.  [name] is a 79 y.o. male patient admitted from ED:   Last Documented Vital Signs: Blood pressure 151/59, pulse 88, temperature 98.1 F (36.7 C), temperature source Oral, resp. rate 16, SpO2 99 %.  Cardiac Monitoring: Box # 07 in place. Cardiac monitor yields:normal sinus rhythm.  IV Fluids:  IV in place, occlusive dsg intact without redness, IV cath hand left, condition patent and no redness none.   Skin: WDL  Patient/Family orientated to room. Information packet given to patient/family. Admission inpatient armband information verified with patient/family to include name and date of birth and placed on patient arm. Side rails up x 2, fall assessment and education completed with patient/family. Patient/family able to verbalize understanding  of risk associated with falls and verbalized understanding to call for assistance before getting out of bed. Call light within reach. Patient/family able to voice and demonstrate understanding of unit orientation instructions.    Will continue to evaluate and treat per MD orders.   Amaryllis Dyke, RN

## 2015-08-11 NOTE — Procedures (Signed)
ELECTROENCEPHALOGRAM REPORT  Date of Study: 08/11/2015  Patient's Name: [name] MRN: [id number] Date of Birth: 1936/05/03  Referring Provider: Dr. Dorian Pod  Clinical History: This is a 79 year old man with altered mental status with confusion and memory difficulty.   Medications: acetaminophen (TYLENOL) tablet 650 mg aspirin EC tablet 325 mg atorvastatin (LIPITOR) tablet 10 mg enoxaparin (LOVENOX) injection 40 mg famotidine (PEPCID) tablet 40 mg hydrochlorothiazide 10 mg/mL oral suspension 6.25 mg ketorolac (TORADOL) 15 MG/ML injection 15 mg lisinopril (PRINIVIL,ZESTRIL) tablet 10 mg pantoprazole (PROTONIX) EC tablet 40 mg  Technical Summary: A multichannel digital EEG recording measured by the international 10-20 system with electrodes applied with paste and impedances below 5000 ohms performed in our laboratory with EKG monitoring in an awake and asleep patient.  Hyperventilation was not performed. Photic stimulation was performed.  The digital EEG was referentially recorded, reformatted, and digitally filtered in a variety of bipolar and referential montages for optimal display.    Description: The patient is awake and asleep during the recording.  During maximal wakefulness, there is a symmetric, medium voltage 10 Hz posterior dominant rhythm that attenuates with eye opening.  The record is symmetric.  During drowsiness and sleep, there is an increase in theta slowing of the background.  Vertex waves and symmetric sleep spindles were seen. Photic stimulation did not elicit any abnormalities.  There were no epileptiform discharges or electrographic seizures seen.    EKG lead was unremarkable.  Impression: This awake and asleep EEG is normal.    Clinical Correlation: A normal EEG does not exclude a clinical diagnosis of epilepsy. Clinical correlation is advised.   Ellouise Newer, M.D.

## 2015-08-11 NOTE — Care Management Note (Addendum)
Case Management Note  Patient Details  Name: [name] MRN: [id number] Date of Birth: 09-07-1935  Subjective/Objective:      Date: 08/11/15 Spoke with patient at the bedside.  Introduced self as Tourist information centre manager and explained role in discharge planning and how to be reached.  Verified patient lives in [place], alone with spouse, he has been getting outpatient pt at [facility] and would like to resume for pt, ot and st,  NCM notified MD, NCM made referral through epic to resume pt/ot and add st. Expressed potential need for no other DME.  Verified patient anticipates to go home with family, at time of discharge and will have full-time  supervision by family friends neighbors at this time to best of their knowledge.  Patient denied needing help with their medication.  Patient usually  drives but at discharge wife will drive him to  to MD appointments.  Verified patient has PCP Bluford Kaufmann.   Plan: CM will continue to follow for discharge planning and [facility] resources.               Action/Plan:   Expected Discharge Date:                  Expected Discharge Plan:  Home/Self Care  In-House Referral:     Discharge planning Services  CM Consult  Post Acute Care Choice:    Choice offered to:     DME Arranged:    DME Agency:     HH Arranged:    [place] Agency:     Status of Service:  Completed, signed off  Medicare Important Message Given:    Date Medicare IM Given:    Medicare IM give by:    Date Additional Medicare IM Given:    Additional Medicare Important Message give by:     If discussed at [place] of Stay Meetings, dates discussed:    Additional Comments:  Zenon Mayo, RN 08/11/2015, 3:52 PM

## 2015-08-12 DIAGNOSIS — F04 Amnestic disorder due to known physiological condition: Secondary | ICD-10-CM

## 2015-08-12 DIAGNOSIS — I10 Essential (primary) hypertension: Secondary | ICD-10-CM

## 2015-08-12 DIAGNOSIS — G454 Transient global amnesia: Secondary | ICD-10-CM

## 2015-08-12 LAB — COMPREHENSIVE METABOLIC PANEL
ALT: 19 U/L (ref 17–63)
AST: 18 U/L (ref 15–41)
Albumin: 3.2 g/dL — ABNORMAL LOW (ref 3.5–5.0)
Alkaline Phosphatase: 43 U/L (ref 38–126)
Anion gap: 7 (ref 5–15)
BUN: 15 mg/dL (ref 6–20)
CO2: 26 mmol/L (ref 22–32)
Calcium: 9 mg/dL (ref 8.9–10.3)
Chloride: 109 mmol/L (ref 101–111)
Creatinine, Ser: 1.02 mg/dL (ref 0.61–1.24)
GFR calc Af Amer: >60 mL/min (ref >60)
GFR calc non Af Amer: >60 mL/min (ref >60)
Glucose, Bld: 94 mg/dL (ref 65–99)
Potassium: 3.7 mmol/L (ref 3.5–5.1)
Sodium: 142 mmol/L (ref 135–145)
Total Bilirubin: 1.5 mg/dL — ABNORMAL HIGH (ref 0.3–1.2)
Total Protein: 5.1 g/dL — ABNORMAL LOW (ref 6.5–8.1)

## 2015-08-12 LAB — CBC
HCT: 36 % — ABNORMAL LOW (ref 39.0–52.0)
Hemoglobin: 11.7 g/dL — ABNORMAL LOW (ref 13.0–17.0)
MCH: 32.3 pg (ref 26.0–34.0)
MCHC: 32.5 g/dL (ref 30.0–36.0)
MCV: 99.4 fL (ref 78.0–100.0)
Platelets: 170 10*3/uL (ref 150–400)
RBC: 3.62 MIL/uL — ABNORMAL LOW (ref 4.22–5.81)
RDW: 13.1 % (ref 11.5–15.5)
WBC: 4.8 10*3/uL (ref 4.0–10.5)

## 2015-08-12 LAB — HEMOGLOBIN A1C
Hgb A1c MFr Bld: 5.9 % — ABNORMAL HIGH (ref 4.8–5.6)
Mean Plasma Glucose: 123 mg/dL

## 2015-08-12 NOTE — Progress Notes (Signed)
[name] to be D/C'd Home per MD order.  Discussed with the patient and all questions fully answered.  VSS, Skin clean, dry and intact without evidence of skin break down, no evidence of skin tears noted. IV catheter discontinued intact. Site without signs and symptoms of complications. Dressing and pressure applied.  An After Visit Summary was printed and given to the patient. Patient received prescription.  D/c education completed with patient/family including follow up instructions, medication list, d/c activities limitations if indicated, with other d/c instructions as indicated by MD - patient able to verbalize understanding, all questions fully answered.   Patient instructed to return to ED, call 911, or call MD for any changes in condition.   Patient escorted via [employer], and D/C home via private auto.  [name] 08/12/2015 11:27 AM

## 2015-08-12 NOTE — Discharge Summary (Signed)
Physician Discharge Summary  [name] [id number] DOB: [birth year] DOA: 08/10/2015  PCP: Nyoka Cowden, MD  Admit date: 08/10/2015 Discharge date: 08/12/2015  Time spent: >35 minutes  Recommendations for Outpatient Follow-up:  F/u with PCP in 3-7 days as needed  Discharge Diagnoses:  Principal Problem:   TIA (transient ischemic attack) Active Problems:   Essential hypertension   GERD   Hyperlipidemia   Carotid stenosis   Acute confusional state   Discharge Condition: stable   Diet recommendation: low sodium   Filed Weights   08/11/15 0110  Weight: 63.685 kg (140 lb 6.4 oz)    History of present illness:  79 y.o. male with a history of ankylosing spondylitis, hypertension, hyperlipidemia, peptic ulcer disease and TIA, presenting with altered mental status with confusion and memory difficulty. Patient had difficulty remembering names of persons with whom he was familiar. His wife indicated that he has repeatedly asked the same questions that the sleep answered. He is also shown loss of memory for events prior to and after arriving in the emergency room. He had some difficulty with word finding. No focal weakness was noted.  Hospital Course:  1. Probable TIA (transient ischemic attack)Telemetry NSR, EEG: no epileptiform waves, echo-LVEF 60-65%. MRI-no acute infarcts. TSH 6.7 and B12 621. LDL-48. HDL-64. ha1c-5.9 -symptoms have resolved. Neuro exam is non focal. No further or new neurological symptoms. Patient is recommended to cont ASA, statin and outpatient follow up : 2. Essential hypertension. Continue lisinopril, Hydrochlorothiazide. Cont outpatient titration meds as needed    Procedures:  Echo: Study Conclusions  - Left ventricle: The cavity size was normal. Wall thickness was normal. Systolic function was normal. The estimated ejection fraction was in the range of 60% to 65%. - Right ventricle: The cavity size was mildly dilated.  Wall thickness was normal. (i.e. Studies not automatically included, echos, thoracentesis, etc; not x-rays)  Consultations:  Neurology   Discharge Exam: Filed Vitals:   08/12/15 0020 08/12/15 0444  BP: 146/65 114/62  Pulse: 98 89  Temp: 97.6 F (36.4 C) 97.6 F (36.4 C)  Resp: 18 18    General: alert. No distress  Cardiovascular: s1,s2 rrr Respiratory: CTA BL  Discharge Instructions  Discharge Instructions    Diet - low sodium heart healthy    Complete by:  As directed      Discharge instructions    Complete by:  As directed   Please follow up with primary care doctor in 3-7 days as needed     Increase activity slowly    Complete by:  As directed             Medication List    TAKE these medications        aspirin 81 MG tablet  Take 81 mg by mouth daily.     atorvastatin 10 MG tablet  Commonly known as:  LIPITOR  Take 1 tablet by mouth  daily     famotidine 40 MG tablet  Commonly known as:  PEPCID  Take 40 mg by mouth 2 (two) times daily.     fish oil-omega-3 fatty acids 1000 MG capsule  Take 1 g by mouth daily.     lisinopril-hydrochlorothiazide 20-12.5 MG tablet  Commonly known as:  PRINZIDE,ZESTORETIC  Take 0.5 tablets by mouth daily.     multivitamin with minerals Tabs tablet  Take 1 tablet by mouth daily.     nitroGLYCERIN 0.4 MG SL tablet  Commonly known as:  NITROSTAT  Place 1 tablet (0.4 mg  total) under the tongue every 5 (five) minutes as needed.     triamcinolone cream 0.1 %  Commonly known as:  KENALOG  Apply 1 application topically 2 (two) times daily.       No Known Allergies     Follow-up Information    Follow up with [facility].   Specialty:  Rehabilitation   Why:  will need to resume outpatient physical therapy, occupational therapy and speech therapy   Contact information:   [address] [id number] [place] [phone]       The results of  significant diagnostics from this hospitalization (including imaging, microbiology, ancillary and laboratory) are listed below for reference.    Significant Diagnostic Studies: Dg Chest 2 View  08/10/2015  CLINICAL DATA:  One-day history of altered mental status. EXAM: CHEST  2 VIEW COMPARISON:  09/26/2013. FINDINGS: The cardiac silhouette, mediastinal and hilar contours are within normal limits and stable. There is mild tortuosity of the thoracic aorta. The lungs are clear. No pleural effusion. The bony thorax is intact. Stable degenerative changes involving the thoracic spine. IMPRESSION: No acute cardiopulmonary findings. Electronically Signed   By: Marijo Sanes M.D.   On: 08/10/2015 17:07   Ct Head Wo Contrast  08/10/2015  CLINICAL DATA:  Confusion. EXAM: CT HEAD WITHOUT CONTRAST TECHNIQUE: Contiguous axial images were obtained from the base of the skull through the vertex without intravenous contrast. COMPARISON:  11/04/2007 head CT. FINDINGS: No evidence of parenchymal hemorrhage or extra-axial fluid collection. No mass lesion, mass effect, or midline shift. No CT evidence of acute infarction. Intracranial atherosclerosis. Nonspecific mild subcortical and periventricular white matter hypodensity, most in keeping with chronic small vessel ischemic change. Cerebral volume is age appropriate. No ventriculomegaly. The visualized paranasal sinuses are essentially clear. The mastoid air cells are unopacified. No evidence of calvarial fracture. IMPRESSION: 1. No evidence of acute intracranial abnormality. 2. Intracranial atherosclerosis and mild chronic small vessel ischemic white matter change . Electronically Signed   By: Ilona Sorrel M.D.   On: 08/10/2015 16:26   Mr Jodene Nam Head Wo Contrast  08/10/2015  CLINICAL DATA:  Initial evaluation for acute confusion, memory difficulty. EXAM: MRI HEAD WITHOUT CONTRAST MRA HEAD WITHOUT CONTRAST TECHNIQUE: Multiplanar, multiecho pulse sequences of the brain and  surrounding structures were obtained without intravenous contrast. Angiographic images of the head were obtained using MRA technique without contrast. COMPARISON:  Prior CT from earlier the same day. FINDINGS: MRI HEAD FINDINGS Age appropriate cerebral atrophy present. No significant white matter disease present for patient age. No areas of chronic infarction. No abnormal foci of restricted diffusion to suggest acute intracranial infarct. Gray-white matter differentiation maintained. Normal intravascular flow voids are preserved. No acute or chronic intracranial hemorrhage. No mass lesion, midline shift, or mass effect. No hydrocephalus. No extra-axial fluid collection. Craniocervical junction within normal limits. Mild thickening of the tectorial membrane noted without stenosis. Pituitary gland normal. No acute abnormality about the orbits. Sequela prior bilateral lens extraction noted. Axial mild myopia noted. Paranasal sinuses are clear. Mild scattered opacity within the left mastoid air cells. Right mastoid air cells are clear. Inner ear structures normal. Bone marrow signal intensity within normal limits. No scalp soft tissue abnormality. MRA HEAD FINDINGS ANTERIOR CIRCULATION: Visualized distal cervical segments of the internal carotid arteries are patent with antegrade flow. Petrous, cavernous, and supraclinoid segments are widely patent bilaterally. A1 segments, anterior communicating artery, and anterior cerebral arteries well opacified. M1 segments widely patent without stenosis or occlusion.  MCA bifurcations normal. Distal MCA branches well opacified. POSTERIOR CIRCULATION: Vertebral arteries patent to the vertebrobasilar junction. Posterior inferior cerebral arteries patent. Basilar artery well opacified. Superior cerebral arteries and posterior cerebral arteries widely patent. No aneurysm or vascular malformation. IMPRESSION: MRI HEAD IMPRESSION: Negative brain MRI with no acute intracranial infarct or  other process identified. MRA HEAD IMPRESSION: Negative intracranial MRA. No large or proximal arterial branch occlusion. No high-grade or correctable stenosis. Electronically Signed   By: Jeannine Boga M.D.   On: 08/10/2015 23:33   Mr Brain Wo Contrast  08/10/2015  CLINICAL DATA:  Initial evaluation for acute confusion, memory difficulty. EXAM: MRI HEAD WITHOUT CONTRAST MRA HEAD WITHOUT CONTRAST TECHNIQUE: Multiplanar, multiecho pulse sequences of the brain and surrounding structures were obtained without intravenous contrast. Angiographic images of the head were obtained using MRA technique without contrast. COMPARISON:  Prior CT from earlier the same day. FINDINGS: MRI HEAD FINDINGS Age appropriate cerebral atrophy present. No significant white matter disease present for patient age. No areas of chronic infarction. No abnormal foci of restricted diffusion to suggest acute intracranial infarct. Gray-white matter differentiation maintained. Normal intravascular flow voids are preserved. No acute or chronic intracranial hemorrhage. No mass lesion, midline shift, or mass effect. No hydrocephalus. No extra-axial fluid collection. Craniocervical junction within normal limits. Mild thickening of the tectorial membrane noted without stenosis. Pituitary gland normal. No acute abnormality about the orbits. Sequela prior bilateral lens extraction noted. Axial mild myopia noted. Paranasal sinuses are clear. Mild scattered opacity within the left mastoid air cells. Right mastoid air cells are clear. Inner ear structures normal. Bone marrow signal intensity within normal limits. No scalp soft tissue abnormality. MRA HEAD FINDINGS ANTERIOR CIRCULATION: Visualized distal cervical segments of the internal carotid arteries are patent with antegrade flow. Petrous, cavernous, and supraclinoid segments are widely patent bilaterally. A1 segments, anterior communicating artery, and anterior cerebral arteries well opacified.  M1 segments widely patent without stenosis or occlusion. MCA bifurcations normal. Distal MCA branches well opacified. POSTERIOR CIRCULATION: Vertebral arteries patent to the vertebrobasilar junction. Posterior inferior cerebral arteries patent. Basilar artery well opacified. Superior cerebral arteries and posterior cerebral arteries widely patent. No aneurysm or vascular malformation. IMPRESSION: MRI HEAD IMPRESSION: Negative brain MRI with no acute intracranial infarct or other process identified. MRA HEAD IMPRESSION: Negative intracranial MRA. No large or proximal arterial branch occlusion. No high-grade or correctable stenosis. Electronically Signed   By: Jeannine Boga M.D.   On: 08/10/2015 23:33    Microbiology: No results found for this or any previous visit (from the past 240 hour(s)).   Labs: Basic Metabolic Panel:  Recent Labs Lab 08/10/15 0026 08/10/15 1532 08/11/15 0530 08/12/15 0600  NA  --  139 139 142  K  --  4.3 3.8 3.7  CL  --  103 104 109  CO2  --  27 28 26   GLUCOSE  --  117* 90 94  BUN  --  16 15 15   CREATININE  --  1.21 1.16 1.02  CALCIUM  --  10.2 9.3 9.0  MG 1.9  --   --   --    Liver Function Tests:  Recent Labs Lab 08/10/15 1532 08/11/15 0530 08/12/15 0600  AST 27 21 18   ALT 27 22 19   ALKPHOS 53 46 43  BILITOT 2.0* 2.2* 1.5*  PROT 6.7 5.8* 5.1*  ALBUMIN 4.4 3.6 3.2*   No results for input(s): LIPASE, AMYLASE in the last 168 hours. No results for input(s): AMMONIA in the last  168 hours. CBC:  Recent Labs Lab 08/10/15 1532 08/11/15 0530 08/12/15 0600  WBC 6.6 5.1 4.8  NEUTROABS 5.4  --   --   HGB 14.1 12.7* 11.7*  HCT 42.3 37.9* 36.0*  MCV 97.9 99.0 99.4  PLT 206 178 170   Cardiac Enzymes: No results for input(s): CKTOTAL, CKMB, CKMBINDEX, TROPONINI in the last 168 hours. BNP: BNP (last 3 results) No results for input(s): BNP in the last 8760 hours.  ProBNP (last 3 results) No results for input(s): PROBNP in the last 8760  hours.  CBG: No results for input(s): GLUCAP in the last 168 hours.     SignedKinnie Feil  [facility] 08/12/2015, 10:25 AM

## 2015-08-12 NOTE — Progress Notes (Addendum)
OT Eval    08/12/15 1100  OT Visit Information  Last OT Received On 08/12/15  Assistance Needed +1  History of Present Illness Pt adm with acute confusion which is now resolved. MRI negative. Pt with possible TIA.  Precautions  Precautions Fall  Home Living  Family/patient expects to be discharged to: Private residence  Living Arrangements Spouse/significant other  Available Help at Discharge Family  Type of [place] to enter  Home Layout Multi-level;Laundry or work area in [place] unit;Walk-in shower  Bathroom Accessibility Yes  How Accessible Accessible via walker  Home Equipment None  Prior Function  Level of Independence Independent  Comments Participating in [place] for his ankylosing spondylosis  Communication  Communication No difficulties  Cognition  Arousal/Alertness Awake/alert  Behavior During Therapy WFL for tasks assessed/performed  Overall Cognitive Status Within Functional Limits for tasks assessed  Memory Decreased short-term memory  Upper Extremity Assessment  Upper Extremity Assessment Overall WFL for tasks assessed [facility] but limited ROM makes UB dressing difficult at times)  Lower Extremity Assessment  Lower Extremity Assessment Defer to PT evaluation  Cervical / Trunk Assessment  Cervical / Trunk Assessment Other exceptions (spondylosis)  ADL  Overall ADL's  Needs assistance/impaired  Functional mobility during ADLs Supervision/safety  General ADL Comments overall S for ADL. Wife assist as needed and states UB ADL is difficult for her husband at times. Discussed concern over difficulty with memory and pt stated thta his sone waskilled in a car accident this October adn feels that stress could also paly a factor in his current condition. Pt became tearful. Recommended for pt to see counseling to help cope with loss if he felt this was needed. Wife agreed. Also discussed concern over returning to driving.  discussed option of havinga driving specialist assess his ability to safely return to driving if concerns contiinue after 2-3 weeks.   Vision- Assessment  Vision Assessment? No apparent visual deficits  Bed Mobility  Overal bed mobility Modified Independent  Transfers  Overall transfer level Modified independent  Balance  Standing balance-Leahy Scale Good  OT - End of Session  Activity Tolerance Patient tolerated treatment well  Patient left in bed;with call bell/phone within reach;with family/visitor present  Nurse Communication Mobility status  OT Assessment  OT Therapy Diagnosis  Altered mental status  OT Recommendation/Assessment All further OT needs can be met in the next venue of care  OT Problem List Decreased range of motion;Decreased knowledge of use of DME or AE;Impaired UE functional use;Pain  OT Recommendation  Follow Up Recommendations Outpatient OT;Supervision - Intermittent  OT Equipment None recommended by OT  Individuals Consulted  Consulted and Agree with Results and Recommendations Patient;Family member/caregiver  Family Member Consulted wife  Acute Rehab OT Goals  Patient Stated Goal to be independent and deal with loss of son  OT Goal Formulation All assessment and education complete, DC therapy  OT Time Calculation  OT Start Time (ACUTE ONLY) 1130  OT Stop Time (ACUTE ONLY) 1143  OT Time Calculation (min) 13 min  OT General Charges  $OT Visit 1 Procedure  OT Evaluation  $Initial OT Evaluation Tier I 1 Procedure  Written Expression  Dominant Hand Right  Maurie Boettcher, OTR/L  [phone] 08/12/2015

## 2015-08-14 ENCOUNTER — Encounter: Payer: PRIVATE HEALTH INSURANCE | Admitting: Physical Therapy

## 2015-08-15 ENCOUNTER — Encounter: Payer: Self-pay | Admitting: Family Medicine

## 2015-08-15 ENCOUNTER — Ambulatory Visit (INDEPENDENT_AMBULATORY_CARE_PROVIDER_SITE_OTHER): Payer: Medicare Other | Admitting: Family Medicine

## 2015-08-15 VITALS — BP 126/64 | HR 88 | Temp 98.3°F | Ht 67.0 in | Wt 144.8 lb

## 2015-08-15 DIAGNOSIS — E785 Hyperlipidemia, unspecified: Secondary | ICD-10-CM

## 2015-08-15 DIAGNOSIS — I10 Essential (primary) hypertension: Secondary | ICD-10-CM | POA: Diagnosis not present

## 2015-08-15 DIAGNOSIS — F05 Delirium due to known physiological condition: Secondary | ICD-10-CM

## 2015-08-15 DIAGNOSIS — R946 Abnormal results of thyroid function studies: Secondary | ICD-10-CM

## 2015-08-15 DIAGNOSIS — F4321 Adjustment disorder with depressed mood: Secondary | ICD-10-CM | POA: Diagnosis not present

## 2015-08-15 NOTE — Progress Notes (Signed)
Pre visit review using our clinic review tool, if applicable. No additional management support is needed unless otherwise documented below in the visit note. 

## 2015-08-15 NOTE — Progress Notes (Addendum)
HPI:  [name] is a pleasant 79 yo patient of Simonne Martinet here for a transition of care visit following a recent hospitalization. See phone note following hospitalization. Per review of discharge documentation, he was hospitalized 08/10/15-08/12/15 for a brief episode of confusion and word finding difficulty. He had trouble recalling the names of an acquaintance and this was very upsetting and stressful and wife reports he kept asking the same questions.  He had an extensive evaluation with labs, EKG, echo, CT, MRI, carotid dopplers, EEG and per neuro notes most likely transient global amnesia with no further evaluation or treatment advised. He continues his asa, statin and blood pressure medications prescribed by his PCP and cardiologist for HTN, HLD. His labs were stable other then a very mild elevation of TSH. Reports: has been back at baseline since. He and wife wonder why speech outpt PT needed as does not feel has had an residual symptoms. He is dealing with bereavment at the loss of his sone in MVA 2 months ago and he and his wife have both been struggling with this and anxiety. No SI or panic. Denies:Ha, CP, SOB, DOE, any recurrent symptoms.  ROS: See pertinent positives and negatives per HPI.  Past Medical History  Diagnosis Date  . BENIGN PROSTATIC HYPERTROPHY, WITH OBSTRUCTION 01/15/2010  . GERD 02/22/2008  . HYPERGLYCEMIA 11/18/2007  . HYPERLIPIDEMIA 02/22/2008  . HYPERTENSION, UNSPECIFIED 11/10/2009  . PEPTIC ULCER DISEASE 11/17/2007  . TIA (transient ischemic attack)     "not that I know of in the past; they are trying to determine if I've had one today (08/10/2015)"  . History of blood transfusion 1988    "lost ~ 1/2 of my blood volume from multiple bleeding ulcers"  . Bleeding stomach ulcer   . Bleeding esophageal ulcer   . Bleeding duodenal ulcer   . Kidney stones   . History of hiatal hernia   . Osteoarthritis     c-spine  . Arthritis     "back" (08/10/2015)  .  Ankylosing spondylitis ([place]) dx'd ~ 1974  . Situational depression     "son died in [place] 06-05-15"    Past Surgical History  Procedure Laterality Date  . Cataract extraction w/ intraocular lens  implant, bilateral Bilateral 2013    Family History  Problem Relation Age of Onset  . Appendicitis Mother     complications  . Colon cancer Father   . Diabetes type II Father   . Lupus Sister   . Colon cancer Other     1st degree relative <60    Social History   Social History  . Marital Status: Married    Spouse Name: N/A  . Number of Children: N/A  . Years of Education: N/A   Social History Main Topics  . Smoking status: Former Smoker -- 15 years    Types: 23, Pipe    Quit date: 08/26/1978  . Smokeless tobacco: Never Used  . Alcohol Use: Yes     Comment: 08/10/2015 "glass of wine ~ month or so; occasionally a can of beer"  . Drug Use: No  . Sexual Activity: Yes   Other Topics Concern  . None   Social History Narrative   Retired and married.      Current outpatient prescriptions:  .  aspirin 81 MG tablet, Take 81 mg by mouth daily.  , Disp: , Rfl:  .  atorvastatin (LIPITOR) 10 MG tablet, Take 1 tablet by mouth  daily, Disp: 90 tablet, Rfl:  3 .  Cholecalciferol (VITAMIN D PO), Take 1,000 Units by mouth daily., Disp: , Rfl:  .  famotidine (PEPCID) 40 MG tablet, Take 40 mg by mouth 2 (two) times daily., Disp: , Rfl:  .  fish oil-omega-3 fatty acids 1000 MG capsule, Take 1 g by mouth daily. , Disp: , Rfl:  .  lisinopril-hydrochlorothiazide (PRINZIDE,ZESTORETIC) 20-12.5 MG tablet, Take 0.5 tablets by mouth daily., Disp: 45 tablet, Rfl: 3 .  Multiple Vitamin (MULTIVITAMIN WITH MINERALS) TABS tablet, Take 1 tablet by mouth daily., Disp: , Rfl:  .  nitroGLYCERIN (NITROSTAT) 0.4 MG SL tablet, Place 1 tablet (0.4 mg total) under the tongue every 5 (five) minutes as needed. (Patient taking differently: Place 0.4 mg under the tongue every 5 (five) minutes as needed for chest  pain. ), Disp: 25 tablet, Rfl: 6 .  [DISCONTINUED] omeprazole (PRILOSEC) 20 MG capsule, Take 20 mg by mouth daily.  , Disp: , Rfl:   EXAM:  Filed Vitals:   08/15/15 1424  BP: 126/64  Pulse: 88  Temp: 98.3 F (36.8 C)    Body mass index is 22.67 kg/(m^2).  GENERAL: vitals reviewed and listed above, alert, oriented, appears well hydrated and in no acute distress  HEENT: atraumatic, conjunttiva clear, no obvious abnormalities on inspection of external nose and ears  NECK: no obvious masses on inspection  LUNGS: clear to auscultation bilaterally, no wheezes, rales or rhonchi, good air movement  CV: HRRR, no peripheral edema  MS: moves all extremities without noticeable abnormality  PSYCH: pleasant and cooperative,tearful when discussion the loss of his son, somewhat flat affect, speech is somewhat slow though wife reports this is normal for him, thought processing and speech o/w normal, gait normal  ASSESSMENT AND PLAN:  Discussed the following assessment and plan:  Acute confusional state  Essential hypertension  Hyperlipidemia  Grief  Borderline abnormal TFTs  -Lifestyle recs and reductions of CV risks discussed -advised grief counseling and they will consider -continue current medications for reduction CV risks  -follow up with PCP in 3 months, plan on recheck TSH then -Patient advised to return or notify a doctor immediately if symptoms worsen or persist or new concerns arise.  Patient Instructions  BEFORE YOU LEAVE: -schedule follow up with your doctor in 2-3 months  Continue current medications  Please consider grief counseling - thank you for sharing today.     Colin Benton R.

## 2015-08-15 NOTE — Patient Instructions (Signed)
BEFORE YOU LEAVE: -schedule follow up with your doctor in 2-3 months  Continue current medications  Please consider grief counseling - thank you for sharing today.

## 2015-08-22 ENCOUNTER — Ambulatory Visit: Payer: Medicare Other | Admitting: Physical Therapy

## 2015-08-22 DIAGNOSIS — R293 Abnormal posture: Secondary | ICD-10-CM

## 2015-08-22 NOTE — Therapy (Addendum)
[facility] [place], [place], [address] Phone: [phone]   Fax:  [phone]  Physical Therapy Treatment  Patient Details  Name: [name] MRN: [id number] Date of Birth: 1935/11/11 Referring Provider: Jean Rosenthal MD  Encounter Date: 08/22/2015    Past Medical History:  Diagnosis Date  . Ankylosing spondylitis ([place]) dx'd ~ 1974  . Arthritis    "back" (08/10/2015)  . BENIGN PROSTATIC HYPERTROPHY, WITH OBSTRUCTION 01/15/2010  . Bleeding duodenal ulcer   . Bleeding esophageal ulcer   . Bleeding stomach ulcer   . GERD 02/22/2008  . History of blood transfusion 1988   "lost ~ 1/2 of my blood volume from multiple bleeding ulcers"  . History of hiatal hernia   . HYPERGLYCEMIA 11/18/2007  . HYPERLIPIDEMIA 02/22/2008  . HYPERTENSION, UNSPECIFIED 11/10/2009  . Kidney stones   . Osteoarthritis    c-spine  . PEPTIC ULCER DISEASE 11/17/2007  . Situational depression    "son died in [place] June 17, 2015"  . TIA (transient ischemic attack)    "not that I know of in the past; they are trying to determine if I've had one today (08/10/2015)"    Past Surgical History:  Procedure Laterality Date  . CATARACT EXTRACTION W/ INTRAOCULAR LENS  IMPLANT, BILATERAL Bilateral 2013    There were no vitals filed for this visit.  Visit Diagnosis:  Abnormal posture                                 PT Short Term Goals - 07/06/15 1225      PT SHORT TERM GOAL #1   Title Ind with an HEP.   Time 3   Period Weeks   Status Achieved           PT Long Term Goals - 08/22/15 1239      PT LONG TERM GOAL #1   Title Bilateral active cervical rotation= 55-60. degrees so he can turn his head more easiliy while driving.   Time 8   Period Weeks   Status Partially Met               Problem List Patient Active Problem List   Diagnosis Date Noted  . Acute confusional state   . TIA (transient ischemic  attack) 08/10/2015  . Chest pain 08/04/2014  . Carotid stenosis 08/04/2014  . Hyperlipidemia 02/21/2012  . BENIGN PROSTATIC HYPERTROPHY, WITH OBSTRUCTION 01/15/2010  . Essential hypertension 11/10/2009  . GERD 02/22/2008  . NEPHROLITHIASIS, HX OF 11/17/2007   Madelyn Flavors PT  05/21/2016, 10:25 AM  [facility] [address] [place], [place], [address] Phone: [phone]   Fax:  [phone]  Name: [name] MRN: [id number] Date of Birth: 1936-07-04  PHYSICAL THERAPY DISCHARGE SUMMARY  Visits from Start of Care: 17.  Current functional level related to goals / functional outcomes: Please see above.   Remaining deficits: Decreased cervical ROM.   Education / Equipment: HEP. Plan: Patient agrees to discharge.  Patient goals were partially met. Patient is being discharged due to being pleased with the current functional level.  ?????        [name] Applegate MPT

## 2015-08-24 ENCOUNTER — Encounter: Payer: PRIVATE HEALTH INSURANCE | Admitting: Physical Therapy

## 2015-09-15 NOTE — Progress Notes (Signed)
This encounter was created in error - please disregard.

## 2015-10-25 ENCOUNTER — Ambulatory Visit (INDEPENDENT_AMBULATORY_CARE_PROVIDER_SITE_OTHER): Payer: Medicare Other | Admitting: Podiatry

## 2015-10-25 ENCOUNTER — Encounter: Payer: Self-pay | Admitting: Podiatry

## 2015-10-25 DIAGNOSIS — B351 Tinea unguium: Secondary | ICD-10-CM | POA: Diagnosis not present

## 2015-10-25 DIAGNOSIS — M79676 Pain in unspecified toe(s): Secondary | ICD-10-CM

## 2015-10-25 NOTE — Progress Notes (Signed)
Patient ID: [name], male   DOB: [birth year], 80 y.o.   MRN: [id number] Complaint:  Visit Type: Patient returns to my office for continued preventative foot care services. Complaint: Patient states" my nails have grown long and thick and become painful to walk and wear shoes" . The patient presents for preventative foot care services. No changes to ROS  Podiatric Exam: Vascular: dorsalis pedis and posterior tibial pulses are palpable bilateral. Capillary return is immediate. Temperature gradient is WNL. Skin turgor WNL  Sensorium: Normal Semmes Weinstein monofilament test. Normal tactile sensation bilaterally. Nail Exam: Pt has thick disfigured discolored nails with subungual debris noted bilateral entire nail hallux through fifth toenails Ulcer Exam: There is no evidence of ulcer or pre-ulcerative changes or infection. Orthopedic Exam: Muscle tone and strength are WNL. No limitations in general ROM. No crepitus or effusions noted. Foot type and digits show no abnormalities. Bony prominences are unremarkable. Skin: No Porokeratosis. No infection or ulcers  Diagnosis:  Onychomycosis, , Pain in right toe, pain in left toes  Treatment & Plan Procedures and Treatment: Consent by patient was obtained for treatment procedures. The patient understood the discussion of treatment and procedures well. All questions were answered thoroughly reviewed. Debridement of mycotic and hypertrophic toenails, 1 through 5 bilateral and clearing of subungual debris. No ulceration, no infection noted. Patient requests an ankle brace.                   Return Visit-Office Procedure: Patient instructed to return to the office for a follow up visit 3 months for continued evaluation and treatment.    Gardiner Barefoot DPM

## 2015-11-09 ENCOUNTER — Encounter: Payer: PRIVATE HEALTH INSURANCE | Admitting: Internal Medicine

## 2015-11-13 ENCOUNTER — Encounter: Payer: PRIVATE HEALTH INSURANCE | Admitting: Internal Medicine

## 2015-11-13 DIAGNOSIS — H1859 Other hereditary corneal dystrophies: Secondary | ICD-10-CM | POA: Diagnosis not present

## 2015-11-13 DIAGNOSIS — H26493 Other secondary cataract, bilateral: Secondary | ICD-10-CM | POA: Diagnosis not present

## 2015-11-13 DIAGNOSIS — Z961 Presence of intraocular lens: Secondary | ICD-10-CM | POA: Diagnosis not present

## 2015-11-13 DIAGNOSIS — H52203 Unspecified astigmatism, bilateral: Secondary | ICD-10-CM | POA: Diagnosis not present

## 2015-11-15 ENCOUNTER — Encounter: Payer: Self-pay | Admitting: Internal Medicine

## 2015-11-15 ENCOUNTER — Ambulatory Visit (INDEPENDENT_AMBULATORY_CARE_PROVIDER_SITE_OTHER): Payer: Medicare Other | Admitting: Internal Medicine

## 2015-11-15 VITALS — BP 130/70 | HR 78 | Temp 97.9°F | Resp 18 | Ht 66.0 in | Wt 149.0 lb

## 2015-11-15 DIAGNOSIS — I10 Essential (primary) hypertension: Secondary | ICD-10-CM | POA: Diagnosis not present

## 2015-11-15 DIAGNOSIS — Z87442 Personal history of urinary calculi: Secondary | ICD-10-CM

## 2015-11-15 DIAGNOSIS — Z0000 Encounter for general adult medical examination without abnormal findings: Secondary | ICD-10-CM

## 2015-11-15 DIAGNOSIS — E785 Hyperlipidemia, unspecified: Secondary | ICD-10-CM

## 2015-11-15 DIAGNOSIS — I6529 Occlusion and stenosis of unspecified carotid artery: Secondary | ICD-10-CM | POA: Diagnosis not present

## 2015-11-15 MED ORDER — TAMSULOSIN HCL 0.4 MG PO CAPS: 0.4000 mg | ORAL_CAPSULE | Freq: Every day | ORAL | Status: DC

## 2015-11-15 NOTE — Patient Instructions (Signed)
Limit your sodium (Salt) intake  Please check your blood pressure on a regular basis.  If it is consistently greater than 150/90, please make an office appointment.    It is important that you exercise regularly, at least 20 minutes 3 to 4 times per week.  If you develop chest pain or shortness of breath seek  medical attention.  Return in one year for follow-up  

## 2015-11-15 NOTE — Progress Notes (Signed)
Pre visit review using our clinic review tool, if applicable. No additional management support is needed unless otherwise documented below in the visit note. 

## 2015-11-15 NOTE — Progress Notes (Signed)
Subjective:    Patient ID: [name], male    DOB: August 22, 1936, 80 y.o.   MRN: [id number]  HPI  80 year old patient who is seen today for a preventive health examination.  He is followed by cardiology.  He has a history of carotid stenosis and dyslipidemia.  He does quite well.  He has treated hypertension.  He is also followed by GI for GERD.  He is had both upper and lower endoscopy in January 2015.  The last carotid artery Doppler study was December 2016.  The patient was hospitalized mid December 2016 for a TIA associated with an acute confusional state  Allergies (verified):  No Known Drug Allergies  Past Medical History: 1. Nephrolithiasis, hx of 2. Peptic ulcer disease with upper GI bleed in 2009---led to transfusion;  States he had major upper GI bleed in 1988 3. Hyperlipidemia 4. GERD 5. Transient ischemic attack - 2009. Carotid dopplers (12/15) with 40-59% RICA stenosis.  6. hypertension  7. C-spine osteoarthritis 8. Chest pain: ETT-myoview (2/11) pt exercised 4', stopped due to fatigue but had bilateral shoulder pain, BP increased to 206/60. 1 mm ST depression in inferolateral leads. Nuclear images with no ischemia or infarction. Unable to do coronary CTA as unable to beta block HR below 70. ETT-echo was then done, showing hypertensive BP response and no evidence for ischemia or infarction. Suspect shoulder pain was not ischemic.  9. Leg pain: ABIs normal in 7/12.  10.  Ankylosing spondylitis  Family History: Father: father died colon ca age 39, DM II Mother: died complications appendicitis age 42 Siblings: 60 brother alive BCCE, 1 sisiter died age 78 lupus Family History of Colon CA 1st degree relative <60 Family History Diabetes 1st degree relative No premature CAD  Social History: Retired Married Former Smoker Alcohol use-yes Regular exercise-no 3 children 1 son with OSA, obese, 1 son healthy, 1 daughter breast ca age 63   Past Medical History    Diagnosis Date  . BENIGN PROSTATIC HYPERTROPHY, WITH OBSTRUCTION 01/15/2010  . GERD 02/22/2008  . HYPERGLYCEMIA 11/18/2007  . HYPERLIPIDEMIA 02/22/2008  . HYPERTENSION, UNSPECIFIED 11/10/2009  . PEPTIC ULCER DISEASE 11/17/2007  . TIA (transient ischemic attack)     "not that I know of in the past; they are trying to determine if I've had one today (08/10/2015)"  . History of blood transfusion 1988    "lost ~ 1/2 of my blood volume from multiple bleeding ulcers"  . Bleeding stomach ulcer   . Bleeding esophageal ulcer   . Bleeding duodenal ulcer   . Kidney stones   . History of hiatal hernia   . Osteoarthritis     c-spine  . Arthritis     "back" (08/10/2015)  . Ankylosing spondylitis ([place]) dx'd ~ 1974  . Situational depression     "son died in MVA Jun 10, 2015"    Social History   Social History  . Marital Status: Married    Spouse Name: N/A  . Number of Children: N/A  . Years of Education: N/A   Occupational History  . Not on file.   Social History Main Topics  . Smoking status: Former Smoker -- 15 years    Types: 41, Pipe    Quit date: 08/26/1978  . Smokeless tobacco: Never Used  . Alcohol Use: Yes     Comment: 08/10/2015 "glass of wine ~ month or so; occasionally a can of beer"  . Drug Use: No  . Sexual Activity: Yes   Other Topics Concern  .  Not on file   Social History Narrative   Retired and married.     Past Surgical History  Procedure Laterality Date  . Cataract extraction w/ intraocular lens  implant, bilateral Bilateral 2013    Family History  Problem Relation Age of Onset  . Appendicitis Mother     complications  . Colon cancer Father   . Diabetes type II Father   . Lupus Sister   . Colon cancer Other     1st degree relative <60    No Known Allergies  Current Outpatient Prescriptions on File Prior to Visit  Medication Sig Dispense Refill  . aspirin 81 MG tablet Take 81 mg by mouth daily.      Marland Kitchen atorvastatin (LIPITOR) 10 MG tablet Take 1  tablet by mouth  daily 90 tablet 3  . Cholecalciferol (VITAMIN D PO) Take 1,000 Units by mouth daily.    . famotidine (PEPCID) 40 MG tablet Take 40 mg by mouth 2 (two) times daily.    . fish oil-omega-3 fatty acids 1000 MG capsule Take 1 g by mouth daily.     Marland Kitchen lisinopril-hydrochlorothiazide (PRINZIDE,ZESTORETIC) 20-12.5 MG tablet Take 0.5 tablets by mouth daily. 45 tablet 3  . Multiple Vitamin (MULTIVITAMIN WITH MINERALS) TABS tablet Take 1 tablet by mouth daily.    . nitroGLYCERIN (NITROSTAT) 0.4 MG SL tablet Place 1 tablet (0.4 mg total) under the tongue every 5 (five) minutes as needed. (Patient taking differently: Place 0.4 mg under the tongue every 5 (five) minutes as needed for chest pain. ) 25 tablet 6  . [DISCONTINUED] omeprazole (PRILOSEC) 20 MG capsule Take 20 mg by mouth daily.       No current facility-administered medications on file prior to visit.    BP 130/70 mmHg  Pulse 78  Temp(Src) 97.9 F (36.6 C) (Oral)  Resp 18  Ht 5\' 6"  (1.676 m)  Wt 149 lb (67.586 kg)  BMI 24.06 kg/m2  SpO2 99%   1. Risk factors, based on past  M,S,F history.  Cardiovascular risk factors include hypertension , dyslipidemia and peripheral vascular disease. Patient does have a history of carotid artery stenosis. No exertional chest pain  2.  Physical activities: walks daily without exercise limitations;  goes to a rehabilitation center 3 times per week for exercise  3.  Depression/mood: no history depression or mood disorder  4.  Hearing: no deficits  5.  ADL's: independent in all aspects of daily living  6.  Fall risk: low  7.  Home safety: no problems identified  8.  Height weight, and visual acuity; patient states that he has lost 2 inches in height.  Due to AS.  No change in visual acuity.  Quite pleased with bilateral cataract extraction surgery  9.  Counseling: heart healthy diet regular exercise encouraged  10. Lab orders based on risk factors: laboratory profile reviewed from  the past year  42. Referral : follow-up cardiology  12. Care plan: heart healthy diet regular exercise recommended  13. Cognitive assessment:  Alert and oriented with normal affect.  No cognitive dysfunction  14. Screening: we'll continue to have annual health examinations with screening lab Patient was provided with a written and personalized care plan  15. Provider List Update:   Primary care GI ophthalmology, cardiology dermatology.  Orthopedics and podiatry    Review of Systems  Constitutional: Negative for fever, chills, activity change, appetite change and fatigue.  HENT: Negative for congestion, dental problem, ear pain, hearing loss, mouth sores, rhinorrhea,  sinus pressure, sneezing, tinnitus, trouble swallowing and voice change.   Eyes: Negative for photophobia, pain, redness and visual disturbance.  Respiratory: Negative for apnea, cough, choking, chest tightness, shortness of breath and wheezing.   Cardiovascular: Negative for chest pain, palpitations and leg swelling.  Gastrointestinal: Negative for nausea, vomiting, abdominal pain, diarrhea, constipation, blood in stool, abdominal distention, anal bleeding and rectal pain.  Genitourinary: Positive for frequency. Negative for dysuria, urgency, hematuria, flank pain, decreased urine volume, discharge, penile swelling, scrotal swelling, difficulty urinating, genital sores and testicular pain.       Nocturia times 4  Musculoskeletal: Negative for myalgias, back pain, joint swelling, arthralgias, gait problem, neck pain and neck stiffness.  Skin: Negative for color change, rash and wound.  Neurological: Negative for dizziness, tremors, seizures, syncope, facial asymmetry, speech difficulty, weakness, light-headedness, numbness and headaches.  Hematological: Negative for adenopathy. Does not bruise/bleed easily.  Psychiatric/Behavioral: Negative for suicidal ideas, hallucinations, behavioral problems, confusion, sleep disturbance,  self-injury, dysphoric mood, decreased concentration and agitation. The patient is not nervous/anxious.        Objective:   Physical Exam  Constitutional: He appears well-developed and well-nourished.  HENT:  Head: Normocephalic and atraumatic.  Right Ear: External ear normal.  Left Ear: External ear normal.  Nose: Nose normal.  Mouth/Throat: Oropharynx is clear and moist.  Eyes: Conjunctivae and EOM are normal. Pupils are equal, round, and reactive to light. No scleral icterus.  Neck: Normal range of motion. Neck supple. No JVD present. No thyromegaly present.  Cardiovascular: Regular rhythm and intact distal pulses.  Exam reveals no gallop and no friction rub.   Murmur heard. Grade 2/6 systolic murmur  Dorsalis pedis pulses full.  Posterior tibial pulses not easily palpable   Pulmonary/Chest: Effort normal and breath sounds normal. He exhibits no tenderness.  Abdominal: Soft. Bowel sounds are normal. He exhibits no distension and no mass. There is no tenderness.  Genitourinary: Prostate normal and penis normal. Guaiac negative stool.  Musculoskeletal: Normal range of motion. He exhibits no edema or tenderness.  Osteoarthritic changes of the small joints of the hands  Lymphadenopathy:    He has no cervical adenopathy.  Neurological: He is alert. He has normal reflexes. No cranial nerve deficit. Coordination normal.  Skin: Skin is warm and dry. No rash noted.  Psychiatric: He has a normal mood and affect. His behavior is normal.          Assessment & Plan:     Preventive health care Hypertension, well-controlled Dyslipidemia.  Continue statin therapy Carotid artery stenosis.  We'll continue serial carotid artery Doppler studies.  Follow-up cardiology History of TIA.  We'll continue aggressive risk factor modification BPH with urinary frequency and nocturia.  Trial Flomax  Heart healthy diet regular.  Exercise encouraged Recheck one year or as needed   the

## 2015-11-21 DIAGNOSIS — D1801 Hemangioma of skin and subcutaneous tissue: Secondary | ICD-10-CM | POA: Diagnosis not present

## 2015-11-21 DIAGNOSIS — L821 Other seborrheic keratosis: Secondary | ICD-10-CM | POA: Diagnosis not present

## 2015-11-21 DIAGNOSIS — L570 Actinic keratosis: Secondary | ICD-10-CM | POA: Diagnosis not present

## 2016-01-25 ENCOUNTER — Ambulatory Visit: Payer: Medicare Other | Admitting: Podiatry

## 2016-03-14 ENCOUNTER — Other Ambulatory Visit: Payer: Self-pay | Admitting: Nurse Practitioner

## 2016-05-30 DIAGNOSIS — M454 Ankylosing spondylitis of thoracic region: Secondary | ICD-10-CM | POA: Diagnosis not present

## 2016-05-30 DIAGNOSIS — I2510 Atherosclerotic heart disease of native coronary artery without angina pectoris: Secondary | ICD-10-CM | POA: Diagnosis not present

## 2016-05-30 DIAGNOSIS — M436 Torticollis: Secondary | ICD-10-CM | POA: Diagnosis not present

## 2016-06-27 DIAGNOSIS — Z23 Encounter for immunization: Secondary | ICD-10-CM | POA: Diagnosis not present

## 2016-08-12 ENCOUNTER — Telehealth: Payer: Self-pay | Admitting: Cardiology

## 2016-08-12 MED ORDER — ATORVASTATIN CALCIUM 10 MG PO TABS: 10.0000 mg | ORAL_TABLET | Freq: Every day | ORAL | 0 refills | Status: DC

## 2016-08-12 MED ORDER — LISINOPRIL-HYDROCHLOROTHIAZIDE 20-12.5 MG PO TABS: 0.5000 | ORAL_TABLET | Freq: Every day | ORAL | 0 refills | Status: DC

## 2016-08-12 NOTE — Telephone Encounter (Signed)
°*  STAT* If patient is at the pharmacy, call can be transferred to refill team.   1. Which medications need to be refilled? (please list name of each medication and dose if known) Lisinopril and Lipitor  2. Which pharmacy/location (including street and city if local pharmacy) is medication to be sent to? [facility]  3. Do they need a 30 day or 90 day supply?  90 days

## 2016-08-20 ENCOUNTER — Other Ambulatory Visit: Payer: Self-pay | Admitting: Internal Medicine

## 2016-10-03 ENCOUNTER — Encounter: Payer: Self-pay | Admitting: Podiatry

## 2016-10-03 ENCOUNTER — Ambulatory Visit (INDEPENDENT_AMBULATORY_CARE_PROVIDER_SITE_OTHER): Payer: Medicare Other | Admitting: Podiatry

## 2016-10-03 VITALS — Ht 66.0 in | Wt 149.0 lb

## 2016-10-03 DIAGNOSIS — M79676 Pain in unspecified toe(s): Secondary | ICD-10-CM | POA: Diagnosis not present

## 2016-10-03 DIAGNOSIS — B351 Tinea unguium: Secondary | ICD-10-CM | POA: Diagnosis not present

## 2016-10-03 NOTE — Progress Notes (Signed)
Patient ID: [name], male   DOB: 02-15-1936, 81 y.o.   MRN: [id number] Complaint:  Visit Type: Patient returns to my office for continued preventative foot care services. Complaint: Patient states" my nails have grown long and thick and become painful to walk and wear shoes" . The patient presents for preventative foot care services. No changes to ROS  Podiatric Exam: Vascular: dorsalis pedis and posterior tibial pulses are palpable bilateral. Capillary return is immediate. Temperature gradient is WNL. Skin turgor WNL  Sensorium: Normal Semmes Weinstein monofilament test. Normal tactile sensation bilaterally. Nail Exam: Pt has thick disfigured discolored nails with subungual debris noted bilateral entire nail hallux through fifth toenails Ulcer Exam: There is no evidence of ulcer or pre-ulcerative changes or infection. Orthopedic Exam: Muscle tone and strength are WNL. No limitations in general ROM. No crepitus or effusions noted. Foot type and digits show no abnormalities. Bony prominences are unremarkable. Skin: No Porokeratosis. No infection or ulcers  Diagnosis:  Onychomycosis, , Pain in right toe, pain in left toes  Treatment & Plan Procedures and Treatment: Consent by patient was obtained for treatment procedures. The patient understood the discussion of treatment and procedures well. All questions were answered thoroughly reviewed. Debridement of mycotic and hypertrophic toenails, 1 through 5 bilateral and clearing of subungual debris. No ulceration, no infection noted.                     Return Visit-Office Procedure: Patient instructed to return to the office for a follow up visit 3 months for continued evaluation and treatment.    Gardiner Barefoot DPM

## 2016-10-11 ENCOUNTER — Encounter: Payer: Self-pay | Admitting: Cardiology

## 2016-10-16 ENCOUNTER — Ambulatory Visit (INDEPENDENT_AMBULATORY_CARE_PROVIDER_SITE_OTHER): Payer: Medicare Other

## 2016-10-16 ENCOUNTER — Ambulatory Visit (INDEPENDENT_AMBULATORY_CARE_PROVIDER_SITE_OTHER): Payer: Medicare Other | Admitting: Orthopaedic Surgery

## 2016-10-16 DIAGNOSIS — M453 Ankylosing spondylitis of cervicothoracic region: Secondary | ICD-10-CM | POA: Insufficient documentation

## 2016-10-16 DIAGNOSIS — M25511 Pain in right shoulder: Secondary | ICD-10-CM | POA: Diagnosis not present

## 2016-10-16 DIAGNOSIS — M549 Dorsalgia, unspecified: Secondary | ICD-10-CM

## 2016-10-16 DIAGNOSIS — M542 Cervicalgia: Secondary | ICD-10-CM

## 2016-10-16 DIAGNOSIS — G8929 Other chronic pain: Secondary | ICD-10-CM | POA: Diagnosis not present

## 2016-10-16 MED ORDER — MELOXICAM 15 MG PO TABS: 15.0000 mg | ORAL_TABLET | Freq: Every day | ORAL | 0 refills | Status: DC

## 2016-10-16 MED ORDER — LIDOCAINE HCL 1 % IJ SOLN
3.0000 mL | INTRAMUSCULAR | Status: AC | PRN
  Administered 2016-10-16: 3 mL

## 2016-10-16 MED ORDER — METHYLPREDNISOLONE ACETATE 40 MG/ML IJ SUSP
40.0000 mg | INTRAMUSCULAR | Status: AC | PRN
  Administered 2016-10-16: 40 mg via INTRA_ARTICULAR

## 2016-10-16 NOTE — Progress Notes (Signed)
Office Visit Note   Patient: [name]           Date of Birth: [birth year]           MRN: [id number] Visit Date: 10/16/2016              Requested by: Casey Lor, MD [place], [place] [address] PCP: Casey Cowden, MD   Assessment & Plan: Visit Diagnoses:  1. Neck pain   2. Upper back pain   3. Chronic right shoulder pain   4. Ankylosing spondylitis of cervicothoracic region [facility])     Plan: I spoke with him about trying physical therapy for his neck he is agreeing with this. I'm unable try meloxicam as an anti-inflammatory since he's not on any traditional anti-inflammatories. I'll like to see him back in a month to see how is doing overall.  Follow-Up Instructions: Return in about 4 weeks (around 11/13/2016).   Orders:  Orders Placed This Encounter  Procedures  . Large Joint Injection/Arthrocentesis  . XR Cervical Spine 2 or 3 views  . XR Thoracic Spine 2 View   Meds ordered this encounter  Medications  . meloxicam (MOBIC) 15 MG tablet    Sig: Take 1 tablet (15 mg total) by mouth daily.    Dispense:  30 tablet    Refill:  0      Procedures: Large Joint Inj Date/Time: 10/16/2016 1:55 PM Performed by: [name] Authorized by: [name]   Location:  Shoulder Site:  R subacromial bursa Ultrasound Guidance: No   Fluoroscopic Guidance: No   Arthrogram: No   Medications:  3 mL lidocaine 1 %; 40 mg methylPREDNISolone acetate 40 MG/ML     Clinical Data: No additional findings.   Subjective: No chief complaint on file. [name] is a 81 year old gentleman we've seen before. He has a history of ankylosing spondylitis. He only takes Tylenol arthritis for this. He does feel like his neck and dressed spine and slowly worsened in terms of his stiffness and he had does feel like a "hot poker" is bothering his left trapezius areas where he points. He is also has some right shoulder pain with activities. He  denies any nubs and tingling in his hands or weakness in his hands.  HPI  Review of Systems He currently denies any shortness of breath. He denies any fever, chills, nausea, vomiting headache or chest pain  Objective: Vital Signs: There were no vitals taken for this visit.  Physical Exam He is alert and oriented 3 and moves around slowly. Ortho Exam His cervical spine lateral rotation and bending are definitely limited and stiff. His flexion extension is also significantly limited. He had shows signs of impingement on the right shoulder on exam. He has a positive Neer and Hawkins signs and a weak rotator cuff. Specialty Comments:  No specialty comments available.  Imaging: Xr Thoracic Spine 2 View  Result Date: 10/16/2016 2 views of his thoracic spine show evidence of ankylosing spondylitis with bridging osteophytes. He has significant degenerative disc disease and an increase in his kyphosis  Xr Cervical Spine 2 Or 3 Views  Result Date: 10/16/2016 2 views of his cervical spine show mild degenerative changes of the lower aspect of the cervical spine but otherwise an accentuation of his cervical lordosis. There is no evidence of any acute injury    PMFS History: Patient Active Problem List   Diagnosis Date Noted  . Ankylosing spondylitis of cervicothoracic region (  [place]) 10/16/2016  . Acute confusional state   . TIA (transient ischemic attack) 08/10/2015  . Chest pain 08/04/2014  . Carotid stenosis 08/04/2014  . Hyperlipidemia 02/21/2012  . BENIGN PROSTATIC HYPERTROPHY, WITH OBSTRUCTION 01/15/2010  . Essential hypertension 11/10/2009  . GERD 02/22/2008  . NEPHROLITHIASIS, HX OF 11/17/2007   Past Medical History:  Diagnosis Date  . Ankylosing spondylitis ([place]) dx'd ~ 1974  . Arthritis    "back" (08/10/2015)  . BENIGN PROSTATIC HYPERTROPHY, WITH OBSTRUCTION 01/15/2010  . Bleeding duodenal ulcer   . Bleeding esophageal ulcer   . Bleeding stomach ulcer   . GERD 02/22/2008    . History of blood transfusion 1988   "lost ~ 1/2 of my blood volume from multiple bleeding ulcers"  . History of hiatal hernia   . HYPERGLYCEMIA 11/18/2007  . HYPERLIPIDEMIA 02/22/2008  . HYPERTENSION, UNSPECIFIED 11/10/2009  . Kidney stones   . Osteoarthritis    c-spine  . PEPTIC ULCER DISEASE 11/17/2007  . Situational depression    "son died in [place] 07/13/15"  . TIA (transient ischemic attack)    "not that I know of in the past; they are trying to determine if I've had one today (08/10/2015)"    Family History  Problem Relation Age of Onset  . Appendicitis Mother     complications  . Colon cancer Father   . Diabetes type II Father   . Lupus Sister   . Colon cancer Other     1st degree relative <60    Past Surgical History:  Procedure Laterality Date  . CATARACT EXTRACTION W/ INTRAOCULAR LENS  IMPLANT, BILATERAL Bilateral 2013   Social History   Occupational History  . Not on file.   Social History Main Topics  . Smoking status: Former Smoker    Years: 15.00    Types: Cigars, Pipe    Quit date: 08/26/1978  . Smokeless tobacco: Never Used  . Alcohol use Yes     Comment: 08/10/2015 "glass of wine ~ month or so; occasionally a can of beer"  . Drug use: No  . Sexual activity: Yes

## 2016-10-19 NOTE — Progress Notes (Signed)
CARDIOLOGY OFFICE NOTE  Date:  10/21/2016    [name] Date of Birth: [birth year] Medical Record [id number]  PCP:  Nyoka Cowden, MD  Cardiologist:  Peter Martinique MD   Chief Complaint  Patient presents with  . Establish Care    est care, no concerns   . Hypertension  . Hyperlipidemia    History of Present Illness: [name] is a 81 y.o. male who presents today for cardiac follow up. He is a former patient of Dr. Aundra Dubin.  He has a history of hyperlipidemia, HTN, and GERD. He has no known CAD. Negative stress echo in 2011. Does have known carotid disease -  study in 07/2014 with 40 to 59% RICA stenosis. Admitted in December 2016 with possible TIA. Echo was unremarkable. Carotid dopplers then showed only mild disease.  On follow up today he states he is doing well.  He denies any chest pain, SOB, or palpitations. He walks 2 mi/day. He does have neck pain secondary to ankylosing spondylitis and is starting on Mobic. He enjoys traveling in his RV.   Past Medical History: 1. Nephrolithiasis, hx of 2. Peptic ulcer disease with upper GI bleed in 2009---led to transfusion 3. Hyperlipidemia 4. GERD 5. Transient ischemic attack - 2009. Carotid dopplers (12/15) with 40-59% RICA stenosis.  6. hypertension  7. C-spine osteoarthritis 8. Chest pain: ETT-myoview (2/11) pt exercised 4', stopped due to fatigue but had bilateral shoulder pain, BP increased to 206/60. 1 mm ST depression in inferolateral leads. Nuclear images with no ischemia or infarction. Unable to do coronary CTA as unable to beta block HR below 70. ETT-echo was then done, showing hypertensive BP response and no evidence for ischemia or infarction. Suspect shoulder pain was not ischemic.  9. Leg pain: ABIs normal in 7/12.    Past Medical History:  Diagnosis Date  . Ankylosing spondylitis ([place]) dx'd ~ 1974  . Arthritis    "back" (08/10/2015)  . BENIGN PROSTATIC HYPERTROPHY, WITH OBSTRUCTION  01/15/2010  . Bleeding duodenal ulcer   . Bleeding esophageal ulcer   . Bleeding stomach ulcer   . GERD 02/22/2008  . History of blood transfusion 1988   "lost ~ 1/2 of my blood volume from multiple bleeding ulcers"  . History of hiatal hernia   . HYPERGLYCEMIA 11/18/2007  . HYPERLIPIDEMIA 02/22/2008  . HYPERTENSION, UNSPECIFIED 11/10/2009  . Kidney stones   . Osteoarthritis    c-spine  . PEPTIC ULCER DISEASE 11/17/2007  . Situational depression    "son died in [place] 07/10/2015"  . TIA (transient ischemic attack)    "not that I know of in the past; they are trying to determine if I've had one today (08/10/2015)"    Past Surgical History:  Procedure Laterality Date  . CATARACT EXTRACTION W/ INTRAOCULAR LENS  IMPLANT, BILATERAL Bilateral 2013     Medications: Current Outpatient Prescriptions  Medication Sig Dispense Refill  . aspirin 81 MG tablet Take 81 mg by mouth daily.      Marland Kitchen atorvastatin (LIPITOR) 10 MG tablet Take 1 tablet (10 mg total) by mouth daily. 30 tablet 0  . Cholecalciferol (VITAMIN D PO) Take 1,000 Units by mouth daily.    . famotidine (PEPCID) 20 MG tablet Take 20 mg by mouth 2 (two) times daily.     . fish oil-omega-3 fatty acids 1000 MG capsule Take 1 g by mouth daily.     Marland Kitchen lisinopril-hydrochlorothiazide (PRINZIDE,ZESTORETIC) 20-12.5 MG tablet Take 0.5 tablets by mouth daily. 15 tablet  0  . meloxicam (MOBIC) 15 MG tablet Take 1 tablet (15 mg total) by mouth daily. 30 tablet 0  . Multiple Vitamin (MULTIVITAMIN WITH MINERALS) TABS tablet Take 1 tablet by mouth daily.    . nitroGLYCERIN (NITROSTAT) 0.4 MG SL tablet Place 1 tablet (0.4 mg total) under the tongue every 5 (five) minutes as needed. (Patient taking differently: Place 0.4 mg under the tongue every 5 (five) minutes as needed for chest pain. ) 25 tablet 6  . tamsulosin (FLOMAX) 0.4 MG CAPS capsule TAKE 1 CAPSULE BY MOUTH  DAILY 90 capsule 0   No current facility-administered medications for this visit.      Allergies: No Known Allergies  Social History: The patient  reports that he quit smoking about 38 years ago. His smoking use included Cigars and Pipe. He quit after 15.00 years of use. He has never used smokeless tobacco. He reports that he drinks alcohol. He reports that he does not use drugs.   Family History: The patient's family history includes Appendicitis in his mother; Colon cancer in his father and other; Diabetes type II in his father; Lupus in his sister.   Review of Systems: Please see the history of present illness.   Otherwise, the review of systems is positive for back pain, muscle pain, snoring and joint swelling.   All other systems are reviewed and negative.   Physical Exam: VS:  BP 136/66   Pulse 72   Ht 5' 6.5" (1.689 m)   Wt 148 lb 8 oz (67.4 kg)   BMI 23.61 kg/m  .  BMI Body mass index is 23.61 kg/m.  Wt Readings from Last 3 Encounters:  10/21/16 148 lb 8 oz (67.4 kg)  10/03/16 149 lb (67.6 kg)  11/15/15 149 lb (67.6 kg)    General: Pleasant. Well developed, well nourished and in no acute distress.   HEENT: Normal.  Neck: Supple, no JVD, carotid bruits, or masses noted.  Cardiac: Regular rate and rhythm. No murmurs, rubs, or gallops. No edema.  Respiratory:  Lungs are clear to auscultation bilaterally with normal work of breathing.  GI: Soft and nontender.  MS: No deformity or atrophy. Gait and ROM intact. He has a stooped posture with kyphosis. Skin: Warm and dry. Color is normal.  Neuro:  Strength and sensation are intact and no gross focal deficits noted.  Psych: Alert, appropriate and with normal affect.   LABORATORY DATA:  EKG:  EKG is not ordered today.   Lab Results  Component Value Date   WBC 4.8 08/12/2015   HGB 11.7 (L) 08/12/2015   HCT 36.0 (L) 08/12/2015   PLT 170 08/12/2015   GLUCOSE 94 08/12/2015   CHOL 121 08/11/2015   TRIG 43 08/11/2015   HDL 64 08/11/2015   LDLCALC 48 08/11/2015   ALT 19 08/12/2015   AST 18 08/12/2015    NA 142 08/12/2015   K 3.7 08/12/2015   CL 109 08/12/2015   CREATININE 1.02 08/12/2015   BUN 15 08/12/2015   CO2 26 08/12/2015   TSH 6.699 (H) 08/11/2015   PSA 0.38 01/15/2010   INR 0.9 11/03/2007   HGBA1C 5.9 (H) 08/11/2015    BNP (last 3 results) No results for input(s): BNP in the last 8760 hours.  ProBNP (last 3 results) No results for input(s): PROBNP in the last 8760 hours.   Other Studies Reviewed Today:   Assessment/Plan: 1. Carotid stenosis: mild by doppler in 2016.   2. Hyperlipidemia: on statin therapy. Will check  fasting lab today.  3. HTN: BP is controlled.   4.. Ankylosing spondylitis. If renal function Ok then he may try Mobic.   Current medicines are reviewed with the patient today.  The patient does not have concerns regarding medicines other than what has been noted above.  The following changes have been made:  See above.  Labs/ tests ordered today include:    Orders Placed This Encounter  Procedures  . Basic metabolic panel  . Lipid panel  . Hepatic function panel  . CBC w/Diff/Platelet     Signed: Peter Martinique MD, [facility]  10/21/2016 1:07 PM  [place]

## 2016-10-21 ENCOUNTER — Ambulatory Visit (INDEPENDENT_AMBULATORY_CARE_PROVIDER_SITE_OTHER): Payer: Medicare Other | Admitting: Cardiology

## 2016-10-21 ENCOUNTER — Ambulatory Visit: Payer: Medicare Other | Attending: Orthopaedic Surgery | Admitting: Physical Therapy

## 2016-10-21 ENCOUNTER — Encounter: Payer: Self-pay | Admitting: Cardiology

## 2016-10-21 VITALS — BP 136/66 | HR 72 | Ht 66.5 in | Wt 148.5 lb

## 2016-10-21 DIAGNOSIS — E7800 Pure hypercholesterolemia, unspecified: Secondary | ICD-10-CM

## 2016-10-21 DIAGNOSIS — I10 Essential (primary) hypertension: Secondary | ICD-10-CM | POA: Diagnosis not present

## 2016-10-21 DIAGNOSIS — G543 Thoracic root disorders, not elsewhere classified: Secondary | ICD-10-CM | POA: Insufficient documentation

## 2016-10-21 DIAGNOSIS — M542 Cervicalgia: Secondary | ICD-10-CM | POA: Diagnosis not present

## 2016-10-21 DIAGNOSIS — M546 Pain in thoracic spine: Secondary | ICD-10-CM | POA: Diagnosis not present

## 2016-10-21 DIAGNOSIS — I6523 Occlusion and stenosis of bilateral carotid arteries: Secondary | ICD-10-CM

## 2016-10-21 DIAGNOSIS — R293 Abnormal posture: Secondary | ICD-10-CM | POA: Diagnosis not present

## 2016-10-21 NOTE — Therapy (Signed)
[facility] [place], [place], [address] Phone: [phone]   Fax:  [phone]  Physical Therapy Evaluation  Patient Details  Name: [name] MRN: [id number] Date of Birth: 1935-11-21 Referring Provider: Jean Rosenthal MD.  Encounter Date: 10/21/2016      PT End of Session - 10/21/16 1454    Visit Number 1   Number of Visits 16   Date for PT Re-Evaluation 12/20/16   PT Start Time 0230   PT Stop Time 0322   PT Time Calculation (min) 52 min   Activity Tolerance Patient tolerated treatment well   Behavior During Therapy [facility] for tasks assessed/performed      Past Medical History:  Diagnosis Date  . Ankylosing spondylitis ([place]) dx'd ~ 1974  . Arthritis    "back" (08/10/2015)  . BENIGN PROSTATIC HYPERTROPHY, WITH OBSTRUCTION 01/15/2010  . Bleeding duodenal ulcer   . Bleeding esophageal ulcer   . Bleeding stomach ulcer   . GERD 02/22/2008  . History of blood transfusion 1988   "lost ~ 1/2 of my blood volume from multiple bleeding ulcers"  . History of hiatal hernia   . HYPERGLYCEMIA 11/18/2007  . HYPERLIPIDEMIA 02/22/2008  . HYPERTENSION, UNSPECIFIED 11/10/2009  . Kidney stones   . Osteoarthritis    c-spine  . PEPTIC ULCER DISEASE 11/17/2007  . Situational depression    "son died in [place] 07/19/15"  . TIA (transient ischemic attack)    "not that I know of in the past; they are trying to determine if I've had one today (08/10/2015)"    Past Surgical History:  Procedure Laterality Date  . CATARACT EXTRACTION W/ INTRAOCULAR LENS  IMPLANT, BILATERAL Bilateral 2013    There were no vitals filed for this visit.       Subjective Assessment - 10/21/16 1522    Subjective the patient reports worsening neck, mid-back and bilateral shoulder pain (rigth > left) over the last year.  An injection in is rigth shoulder was effective.  His resting pain-level tody is a 4/10 but increases to 6-7/10 with movement.  He  reports that sometimes it feels like a hot poker being "jabbed" into the left side of his neck.  Rest decreases his pain.  His sleep is disturbed by pain.  Patient has headaches on occasions.            [facility] PT Assessment - 10/21/16 0001      Assessment   Medical Diagnosis Ankylosing Spondylitis.   Referring Provider Jean Rosenthal MD.   Onset Date/Surgical Date --  One year+.     Precautions   Precautions None     Restrictions   Weight Bearing Restrictions No     Balance Screen   Has the patient fallen in the past 6 months No   Has the patient had a decrease in activity level because of a fear of falling?  No   Is the patient reluctant to leave their home because of a fear of falling?  No     Home Environment   Living Environment Private residence     Prior Function   Level of Independence Independent     Posture/Postural Control   Posture/Postural Control Postural limitations   Postural Limitations Rounded Shoulders;Forward head;Increased thoracic kyphosis     ROM / Strength   AROM / PROM / Strength AROM;Strength     AROM   Overall AROM Comments Bilateral active shoulder elevation= 90 degrees.  Bilateral active cervical rotation= 50 degrees; bilateral  SB= 5 degrees and extension= 15 degrees.     Strength   Overall Strength Comments Bilateral shoulder strength= 4 to 4+/5 and bilateral elbow strength= 5/5.     Palpation   Palpation comment Diffuse bilateral cervical paraspinal musculature and upper thoracic pain.  He has radiation of pain into bilateral shoulders.     Special Tests    Special Tests --  1+/4+ bil UE DTR's. (-) RT shoulder Impingement test.                   [facility] Adult PT Treatment/Exercise - 10/21/16 0001      Modalities   Modalities Electrical Stimulation;Moist Heat     Moist Heat Therapy   Number Minutes Moist Heat 15 Minutes   Moist Heat Location --  Cervical region.     Civil engineer, contracting Location --  Cervical region.   Electrical Stimulation Action IFC   Electrical Stimulation Parameters 80-150 Hz at 100% scan x 15 minutes.   Electrical Stimulation Goals Pain                  PT Short Term Goals - 10/21/16 1554      PT SHORT TERM GOAL #1   Title STG's=LTG's.           PT Long Term Goals - 10/21/16 1554      PT LONG TERM GOAL #1   Title Bilateral active cervical rotation= 60-65. degrees so he can turn his head more easiliy while driving.   Time 8   Period Weeks   Status New     PT LONG TERM GOAL #2   Title Sleep 6 hours undisturbed.   Time 8   Period Weeks   Status New     PT LONG TERM GOAL #3   Title Perform ADL's with pain not > 3/10.   Time 8   Period Weeks   Status New     PT LONG TERM GOAL #4   Title Eliminate headaches.   Time 8   Period Weeks   Status New               Plan - 10/21/16 1537    Clinical Impression Statement The patient presents with c/o bilateral shoulder and worsening neck and mid-back pain.  He has significant losses of neck and bilateral shoulder range of motion.  The patient's limitations decrease his function and ability to perform ADL's.  She sleep is disturbed by pain.  The patient will benefit from skilled physical therapy to include modalities, possibily dry needling and postural exercises.   Rehab Potential Good   PT Frequency 2x / week   PT Duration 8 weeks   PT Treatment/Interventions ADLs/Self Care Home Management;Cryotherapy;Electrical Stimulation;Moist Heat;Ultrasound;Therapeutic activities;Therapeutic exercise;Patient/family education;Passive range of motion;Manual techniques;Dry needling   PT Next Visit Plan STW/M to thoracic and cervical musculature; Modalites and dry needling PRN; cervical active range of motion; pulleys; postural exercises to include scapular retraction.      Patient will benefit from skilled therapeutic intervention in order to improve the following deficits  and impairments:  Pain, Decreased activity tolerance, Decreased range of motion, Postural dysfunction, Decreased strength  Visit Diagnosis: Pain in thoracic spine - Plan: PT plan of care cert/re-cert  Cervicalgia - Plan: PT plan of care cert/re-cert  Abnormal posture - Plan: PT plan of care cert/re-cert      G-Codes - [id number]    Functional Assessment Tool Used (Outpatient Only) FOTO....47% limitation.  Functional Limitation Other PT primary   Other PT Primary Current Status [id number]) At least 40 percent but less than 60 percent impaired, limited or restricted   Other PT Primary Goal Status [id number]) At least 20 percent but less than 40 percent impaired, limited or restricted       Problem List Patient Active Problem List   Diagnosis Date Noted  . Ankylosing spondylitis of cervicothoracic region ([place]) 10/16/2016  . Acute confusional state   . TIA (transient ischemic attack) 08/10/2015  . Chest pain 08/04/2014  . Carotid stenosis 08/04/2014  . Hyperlipidemia 02/21/2012  . BENIGN PROSTATIC HYPERTROPHY, WITH OBSTRUCTION 01/15/2010  . Essential hypertension 11/10/2009  . GERD 02/22/2008  . NEPHROLITHIASIS, HX OF 11/17/2007    [place], [name] MPT 10/21/2016, 3:58 PM  [facility] [address] [place], [place], [address] Phone: [phone]   Fax:  [phone]  Name: [name] MRN: [id number] Date of Birth: Jul 09, 1936

## 2016-10-21 NOTE — Patient Instructions (Signed)
We will check blood work today  Continue your current therapy  I will see you in one year  

## 2016-10-22 DIAGNOSIS — E7800 Pure hypercholesterolemia, unspecified: Secondary | ICD-10-CM | POA: Diagnosis not present

## 2016-10-22 DIAGNOSIS — I6523 Occlusion and stenosis of bilateral carotid arteries: Secondary | ICD-10-CM | POA: Diagnosis not present

## 2016-10-22 DIAGNOSIS — I10 Essential (primary) hypertension: Secondary | ICD-10-CM | POA: Diagnosis not present

## 2016-10-22 LAB — LIPID PANEL
Cholesterol: 126 mg/dL (ref <200)
HDL: 79 mg/dL (ref >40)
LDL Cholesterol: 39 mg/dL (ref <100)
Total CHOL/HDL Ratio: 1.6 Ratio (ref <5.0)
Triglycerides: 38 mg/dL (ref <150)
VLDL: 8 mg/dL (ref <30)

## 2016-10-22 LAB — CBC WITH DIFFERENTIAL/PLATELET
Basophils Absolute: 0 cells/uL (ref 0–200)
Basophils Relative: 0 %
Eosinophils Absolute: 91 cells/uL (ref 15–500)
Eosinophils Relative: 1 %
HCT: 40.3 % (ref 38.5–50.0)
Hemoglobin: 13.6 g/dL (ref 13.2–17.1)
Lymphocytes Relative: 15 %
Lymphs Abs: 1365 cells/uL (ref 850–3900)
MCH: 32.9 pg (ref 27.0–33.0)
MCHC: 33.7 g/dL (ref 32.0–36.0)
MCV: 97.3 fL (ref 80.0–100.0)
MPV: 9.3 fL (ref 7.5–12.5)
Monocytes Absolute: 637 cells/uL (ref 200–950)
Monocytes Relative: 7 %
Neutro Abs: 7007 cells/uL (ref 1500–7800)
Neutrophils Relative %: 77 %
Platelets: 221 10*3/uL (ref 140–400)
RBC: 4.14 MIL/uL — ABNORMAL LOW (ref 4.20–5.80)
RDW: 13.7 % (ref 11.0–15.0)
WBC: 9.1 10*3/uL (ref 3.8–10.8)

## 2016-10-22 LAB — HEPATIC FUNCTION PANEL
ALT: 18 U/L (ref 9–46)
AST: 18 U/L (ref 10–35)
Albumin: 4.5 g/dL (ref 3.6–5.1)
Alkaline Phosphatase: 60 U/L (ref 40–115)
Bilirubin, Direct: 0.3 mg/dL — ABNORMAL HIGH (ref <=0.2)
Indirect Bilirubin: 0.8 mg/dL (ref 0.2–1.2)
Total Bilirubin: 1.1 mg/dL (ref 0.2–1.2)
Total Protein: 6.9 g/dL (ref 6.1–8.1)

## 2016-10-22 LAB — BASIC METABOLIC PANEL
BUN: 23 mg/dL (ref 7–25)
CO2: 26 mmol/L (ref 20–31)
Calcium: 9.8 mg/dL (ref 8.6–10.3)
Chloride: 101 mmol/L (ref 98–110)
Creat: 1.19 mg/dL — ABNORMAL HIGH (ref 0.70–1.18)
Glucose, Bld: 92 mg/dL (ref 65–99)
Potassium: 4.4 mmol/L (ref 3.5–5.3)
Sodium: 138 mmol/L (ref 135–146)

## 2016-10-23 ENCOUNTER — Telehealth: Payer: Self-pay | Admitting: Cardiology

## 2016-10-23 ENCOUNTER — Ambulatory Visit: Payer: PRIVATE HEALTH INSURANCE | Admitting: Cardiology

## 2016-10-23 MED ORDER — ATORVASTATIN CALCIUM 10 MG PO TABS: 10.0000 mg | ORAL_TABLET | Freq: Every day | ORAL | 3 refills | Status: DC

## 2016-10-23 MED ORDER — LISINOPRIL-HYDROCHLOROTHIAZIDE 20-12.5 MG PO TABS: 0.5000 | ORAL_TABLET | Freq: Every day | ORAL | 1 refills | Status: DC

## 2016-10-23 MED ORDER — LISINOPRIL-HYDROCHLOROTHIAZIDE 20-12.5 MG PO TABS: 0.5000 | ORAL_TABLET | Freq: Every day | ORAL | 3 refills | Status: DC

## 2016-10-23 MED ORDER — ATORVASTATIN CALCIUM 10 MG PO TABS: 10.0000 mg | ORAL_TABLET | Freq: Every day | ORAL | 1 refills | Status: DC

## 2016-10-23 NOTE — Telephone Encounter (Signed)
Per result note:  Chemistries, CBC, and lipids all look very good. Pt notified of results

## 2016-10-23 NOTE — Telephone Encounter (Signed)
F/u Message ° °Pt returning RN call about lab work. Please call back to discuss  °

## 2016-10-23 NOTE — Telephone Encounter (Signed)
Pt's Rx was sent to pt's pharmacy as requested. Confirmation received.  °

## 2016-10-23 NOTE — Telephone Encounter (Signed)
New Message   *STAT* If patient is at the pharmacy, call can be transferred to refill team.   1. Which medications need to be refilled? (please list name of each medication and dose if known) Lisinopril 20-12.5mg .... Atorvastatin 10mg   2. Which pharmacy/location (including street and city if local pharmacy) is medication to be sent to? [facility]  3. Do they need a 30 day or 90 day supply? [place]

## 2016-10-25 ENCOUNTER — Ambulatory Visit: Payer: Medicare Other | Attending: Orthopaedic Surgery | Admitting: Physical Therapy

## 2016-10-25 DIAGNOSIS — M546 Pain in thoracic spine: Secondary | ICD-10-CM

## 2016-10-25 DIAGNOSIS — R293 Abnormal posture: Secondary | ICD-10-CM | POA: Diagnosis not present

## 2016-10-25 DIAGNOSIS — M542 Cervicalgia: Secondary | ICD-10-CM | POA: Diagnosis not present

## 2016-10-25 NOTE — Therapy (Signed)
[facility] [place], [place], [address] Phone: [phone]   Fax:  [phone]  Physical Therapy Treatment  Patient Details  Name: [name] MRN: [id number] Date of Birth: [birth year] Referring Provider: Jean Rosenthal MD.  Encounter Date: 10/25/2016      PT End of Session - 10/25/16 0951    Visit Number 2   Number of Visits 16   Date for PT Re-Evaluation 12/20/16   PT Start Time 0906   PT Stop Time 1003   PT Time Calculation (min) 57 min   Activity Tolerance Patient tolerated treatment well   Behavior During Therapy [facility] for tasks assessed/performed      Past Medical History:  Diagnosis Date  . Ankylosing spondylitis ([place]) dx'd ~ 1974  . Arthritis    "back" (08/10/2015)  . BENIGN PROSTATIC HYPERTROPHY, WITH OBSTRUCTION 01/15/2010  . Bleeding duodenal ulcer   . Bleeding esophageal ulcer   . Bleeding stomach ulcer   . GERD 02/22/2008  . History of blood transfusion 1988   "lost ~ 1/2 of my blood volume from multiple bleeding ulcers"  . History of hiatal hernia   . HYPERGLYCEMIA 11/18/2007  . HYPERLIPIDEMIA 02/22/2008  . HYPERTENSION, UNSPECIFIED 11/10/2009  . Kidney stones   . Osteoarthritis    c-spine  . PEPTIC ULCER DISEASE 11/17/2007  . Situational depression    "son died in [place] Jun 14, 2015"  . TIA (transient ischemic attack)    "not that I know of in the past; they are trying to determine if I've had one today (08/10/2015)"    Past Surgical History:  Procedure Laterality Date  . CATARACT EXTRACTION W/ INTRAOCULAR LENS  IMPLANT, BILATERAL Bilateral 2013    There were no vitals filed for this visit.      Subjective Assessment - 10/25/16 0910    Subjective Patient reports intermittent pain into L shoulder and stiffness in B shoulders.   Limitations Sitting   How long can you sit comfortably? 20 minutes.   Patient Stated Goals Reduce pain and sleep better.   Currently in Pain? No/denies                          [facility] Adult PT Treatment/Exercise - 10/25/16 0001      Exercises   Exercises Neck     Neck Exercises: Machines for Strengthening   UBE (Upper Arm Bike) 120 RPM x 10 min alternating direction every min     Neck Exercises: Seated   Upper Extremity D1 Flexion;Extension;10 reps  B with head turns to follow to increase range; PT assist      Neck Exercises: Supine   Neck Retraction Other (comment)  see below   Neck Retraction Limitations small movement but able to do   Upper Extremity Flexion with Stabilization Flexion;20 reps  using cane; hooklying position   UE Flexion with Stabilization Limitations prolonged hold at end range   Other Supine Exercise Lying in cross position x 10 min for pec stretch and doing chin tucks  for cervical retraction      Modalities   Modalities Electrical Stimulation;Moist Heat     Moist Heat Therapy   Number Minutes Moist Heat 15 Minutes   Moist Heat Location Cervical     Electrical Stimulation   Electrical Stimulation Location cervical   Electrical Stimulation Action IFC   Electrical Stimulation Parameters 80-150 Hz x 15 min   Electrical Stimulation Goals Pain  PT Education - 10/25/16 0951    Education provided Yes   Education Details HeP   Person(s) Educated Patient   Methods Explanation;Demonstration   Comprehension Verbalized understanding;Returned demonstration          PT Short Term Goals - 10/21/16 1554      PT SHORT TERM GOAL #1   Title STG's=LTG's.           PT Long Term Goals - 10/21/16 1554      PT LONG TERM GOAL #1   Title Bilateral active cervical rotation= 60-65. degrees so he can turn his head more easiliy while driving.   Time 8   Period Weeks   Status New     PT LONG TERM GOAL #2   Title Sleep 6 hours undisturbed.   Time 8   Period Weeks   Status New     PT LONG TERM GOAL #3   Title Perform ADL's with pain not > 3/10.   Time 8   Period  Weeks   Status New     PT LONG TERM GOAL #4   Title Eliminate headaches.   Time 8   Period Weeks   Status New               Plan - 10/25/16 6606    Clinical Impression Statement Patient did very well with treatment today. He requested increased time on UBE reporting that it felt it was loosening him up. He tolerated prolonged pec stretching well without increased pain. He will benefit from further full body rotational exercises to increase thoracic extension and neck and spinal rotation. Normal response to modalities. No goals met as only second visit.   PT Treatment/Interventions ADLs/Self Care Home Management;Cryotherapy;Electrical Stimulation;Moist Heat;Ultrasound;Therapeutic activities;Therapeutic exercise;Patient/family education;Passive range of motion;Manual techniques;Dry needling   PT Next Visit Plan STW/M to thoracic and cervical musculature; Modalites and dry needling PRN; cervical active range of motion; pulleys; postural exercises to include scapular retraction.   PT Home Exercise Plan supine lying in cross position with chin tucks      Patient will benefit from skilled therapeutic intervention in order to improve the following deficits and impairments:  Pain, Decreased activity tolerance, Decreased range of motion, Postural dysfunction, Decreased strength  Visit Diagnosis: Cervicalgia  Pain in thoracic spine  Abnormal posture     Problem List Patient Active Problem List   Diagnosis Date Noted  . Ankylosing spondylitis of cervicothoracic region ([place]) 10/16/2016  . Acute confusional state   . TIA (transient ischemic attack) 08/10/2015  . Chest pain 08/04/2014  . Carotid stenosis 08/04/2014  . Hyperlipidemia 02/21/2012  . BENIGN PROSTATIC HYPERTROPHY, WITH OBSTRUCTION 01/15/2010  . Essential hypertension 11/10/2009  . GERD 02/22/2008  . NEPHROLITHIASIS, HX OF 11/17/2007    Madelyn Flavors PT 10/25/2016, 9:57 AM  [facility] [address] [place], [place], [address] Phone: [phone]   Fax:  [phone]  Name: ANDI MAHAFFY MRN: [id number] Date of Birth: October 01, 1935

## 2016-10-28 ENCOUNTER — Encounter: Payer: Self-pay | Admitting: Internal Medicine

## 2016-10-29 ENCOUNTER — Ambulatory Visit: Payer: Medicare Other | Admitting: Physical Therapy

## 2016-10-29 ENCOUNTER — Encounter: Payer: Self-pay | Admitting: Physical Therapy

## 2016-10-29 DIAGNOSIS — R293 Abnormal posture: Secondary | ICD-10-CM

## 2016-10-29 DIAGNOSIS — M542 Cervicalgia: Secondary | ICD-10-CM | POA: Diagnosis not present

## 2016-10-29 DIAGNOSIS — M546 Pain in thoracic spine: Secondary | ICD-10-CM | POA: Diagnosis not present

## 2016-10-29 NOTE — Therapy (Signed)
[facility] [place], [place], [address] Phone: [phone]   Fax:  [phone]  Physical Therapy Treatment  Patient Details  Name: [name] MRN: [id number] Date of Birth: [birth year] Referring Provider: Jean Rosenthal MD.  Encounter Date: 10/29/2016      PT End of Session - 10/29/16 0904    Visit Number 3   Number of Visits 16   Date for PT Re-Evaluation 12/20/16   PT Start Time 0902   PT Stop Time 0946   PT Time Calculation (min) 44 min   Activity Tolerance Patient tolerated treatment well   Behavior During Therapy [facility] for tasks assessed/performed      Past Medical History:  Diagnosis Date  . Ankylosing spondylitis ([place]) dx'd ~ 1974  . Arthritis    "back" (08/10/2015)  . BENIGN PROSTATIC HYPERTROPHY, WITH OBSTRUCTION 01/15/2010  . Bleeding duodenal ulcer   . Bleeding esophageal ulcer   . Bleeding stomach ulcer   . GERD 02/22/2008  . History of blood transfusion 1988   "lost ~ 1/2 of my blood volume from multiple bleeding ulcers"  . History of hiatal hernia   . HYPERGLYCEMIA 11/18/2007  . HYPERLIPIDEMIA 02/22/2008  . HYPERTENSION, UNSPECIFIED 11/10/2009  . Kidney stones   . Osteoarthritis    c-spine  . PEPTIC ULCER DISEASE 11/17/2007  . Situational depression    "son died in [place] 2015/07/08"  . TIA (transient ischemic attack)    "not that I know of in the past; they are trying to determine if I've had one today (08/10/2015)"    Past Surgical History:  Procedure Laterality Date  . CATARACT EXTRACTION W/ INTRAOCULAR LENS  IMPLANT, BILATERAL Bilateral 2013    There were no vitals filed for this visit.      Subjective Assessment - 10/29/16 0903    Subjective Patient reports a sore spot in L flank from overactivity yesterday at home.   Limitations Sitting   How long can you sit comfortably? 20 minutes.   Patient Stated Goals Reduce pain and sleep better.   Currently in Pain? No/denies            [facility] PT Assessment - 10/29/16 0001      Assessment   Medical Diagnosis Ankylosing Spondylitis.     Precautions   Precautions None     Restrictions   Weight Bearing Restrictions No                     [facility] Adult PT Treatment/Exercise - 10/29/16 0001      Exercises   Exercises Neck;Lumbar     Neck Exercises: Machines for Strengthening   UBE (Upper Arm Bike) 120 RPM x5 min with forward and backward     Neck Exercises: Standing   Other Standing Exercises Wall walks red theraband x1 RT     Neck Exercises: Seated   Shoulder ABduction Both;20 reps   Shoulder Abduction Limitations horizontal abduction red theraband    Other Seated Exercise 90/90 shoulder strengtheninig attempted although      Neck Exercises: Supine   Neck Retraction 20 reps;5 secs   Cervical Rotation Both;10 reps   Cervical Rotation Limitations with 5 sec hold   Shoulder Flexion Both;15 reps   Shoulder Flexion Limitations Red theraband     Lumbar Exercises: Aerobic   Stationary Bike NuStep L5 x10 min     Lumbar Exercises: Standing   Row Strengthening;Both;20 reps   Row Limitations Pink XTS     Lumbar  Exercises: Seated   Other Seated Lumbar Exercises Seated B D2 strengthening with trunk rotation 2# x15 reps each                  PT Short Term Goals - 10/21/16 1554      PT SHORT TERM GOAL #1   Title STG's=LTG's.           PT Long Term Goals - 10/21/16 1554      PT LONG TERM GOAL #1   Title Bilateral active cervical rotation= 60-65. degrees so he can turn his head more easiliy while driving.   Time 8   Period Weeks   Status New     PT LONG TERM GOAL #2   Title Sleep 6 hours undisturbed.   Time 8   Period Weeks   Status New     PT LONG TERM GOAL #3   Title Perform ADL's with pain not > 3/10.   Time 8   Period Weeks   Status New     PT LONG TERM GOAL #4   Title Eliminate headaches.   Time 8   Period Weeks   Status New               Plan -  10/29/16 [id number]    Clinical Impression Statement Patient tolerated today's treatment well as he had no reports of any increased back or neck pain today. Patient able to tolerate all postural strengthening exercises completed today. Lack of full cervical rotation is a big concern for patient as he has to drive motor home. Cervical rotation in supine with holds at end range were completed today to assist in improving cervical rotation. Lack of trunk rotation noted today as well especially to the left.   Rehab Potential Good   PT Frequency 2x / week   PT Duration 8 weeks   PT Treatment/Interventions ADLs/Self Care Home Management;Cryotherapy;Electrical Stimulation;Moist Heat;Ultrasound;Therapeutic activities;Therapeutic exercise;Patient/family education;Passive range of motion;Manual techniques;Dry needling   PT Next Visit Plan Continue postural strengthening and stretching as well as cervical and trunk rotation exercises per MPT POC.   PT Home Exercise Plan supine lying in cross position with chin tucks   Consulted and Agree with Plan of Care Patient      Patient will benefit from skilled therapeutic intervention in order to improve the following deficits and impairments:  Pain, Decreased activity tolerance, Decreased range of motion, Postural dysfunction, Decreased strength  Visit Diagnosis: Cervicalgia  Pain in thoracic spine  Abnormal posture     Problem List Patient Active Problem List   Diagnosis Date Noted  . Ankylosing spondylitis of cervicothoracic region ([place]) 10/16/2016  . Acute confusional state   . TIA (transient ischemic attack) 08/10/2015  . Chest pain 08/04/2014  . Carotid stenosis 08/04/2014  . Hyperlipidemia 02/21/2012  . BENIGN PROSTATIC HYPERTROPHY, WITH OBSTRUCTION 01/15/2010  . Essential hypertension 11/10/2009  . GERD 02/22/2008  . NEPHROLITHIASIS, HX OF 11/17/2007    Wynelle Fanny, PTA 10/29/2016, 9:59 AM  [facility] [address] [place], [place], [address] Phone: [phone]   Fax:  [phone]  Name: STEVEN SLEPPY MRN: [id number] Date of Birth: [birth year]

## 2016-10-31 ENCOUNTER — Encounter: Payer: Self-pay | Admitting: Internal Medicine

## 2016-10-31 ENCOUNTER — Ambulatory Visit (INDEPENDENT_AMBULATORY_CARE_PROVIDER_SITE_OTHER): Payer: Medicare Other | Admitting: Internal Medicine

## 2016-10-31 VITALS — BP 142/68 | HR 93 | Temp 97.4°F | Ht 66.5 in | Wt 150.8 lb

## 2016-10-31 DIAGNOSIS — I6529 Occlusion and stenosis of unspecified carotid artery: Secondary | ICD-10-CM

## 2016-10-31 DIAGNOSIS — K219 Gastro-esophageal reflux disease without esophagitis: Secondary | ICD-10-CM | POA: Diagnosis not present

## 2016-10-31 DIAGNOSIS — I10 Essential (primary) hypertension: Secondary | ICD-10-CM

## 2016-10-31 DIAGNOSIS — Z0000 Encounter for general adult medical examination without abnormal findings: Secondary | ICD-10-CM

## 2016-10-31 DIAGNOSIS — E7800 Pure hypercholesterolemia, unspecified: Secondary | ICD-10-CM

## 2016-10-31 DIAGNOSIS — I6523 Occlusion and stenosis of bilateral carotid arteries: Secondary | ICD-10-CM | POA: Diagnosis not present

## 2016-10-31 NOTE — Progress Notes (Signed)
Subjective:    Patient ID: [name], male    DOB: 06/07/1936, 81 y.o.   MRN: [id number]  HPI  43 -year-old patient who is seen today for a preventive health examination.   He is followed by cardiology.  He has a history of carotid stenosis and dyslipidemia.  He does quite well.  He has treated hypertension.   He is also followed by GI for GERD.  He is had both upper and lower endoscopy in January 2015.   The last carotid artery Doppler study was December 2016.  The patient was hospitalized mid December 2016 for a TIA associated with an acute confusional state  Allergies (verified):  No Known Drug Allergies  Past Medical History: 1. Nephrolithiasis, hx of 2. Peptic ulcer disease with upper GI bleed in 2009---led to transfusion;  States he had major upper GI bleed in 1988 3. Hyperlipidemia 4. GERD 5. Transient ischemic attack - 2009. Carotid dopplers (12/15) with 40-59% RICA stenosis.  6. hypertension  7. C-spine osteoarthritis 8. Chest pain: ETT-myoview (2/11) pt exercised 4', stopped due to fatigue but had bilateral shoulder pain, BP increased to 206/60. 1 mm ST depression in inferolateral leads. Nuclear images with no ischemia or infarction. Unable to do coronary CTA as unable to beta block HR below 70. ETT-echo was then done, showing hypertensive BP response and no evidence for ischemia or infarction. Suspect shoulder pain was not ischemic.  9. Leg pain: ABIs normal in 7/12.  10.  Ankylosing spondylitis  Family History: Father: father died colon ca age 48, DM II Mother: died complications appendicitis age 25 Siblings: 54 brother alive BCCE, 1 sisiter died age 52 lupus Family History of Colon CA 1st degree relative <60 Family History Diabetes 1st degree relative No premature CAD  Social History: Retired Married Former Smoker Alcohol use-yes Regular exercise-no 3 children 1 son with OSA, obese, 1 son healthy, 1 daughter breast ca age 76  Review of  Systems  Constitutional: Negative for appetite change, chills, fatigue and fever.  HENT: Negative for congestion, dental problem, ear pain, hearing loss, sore throat, tinnitus, trouble swallowing and voice change.   Eyes: Negative for pain, discharge and visual disturbance.  Respiratory: Negative for cough, chest tightness, wheezing and stridor.   Cardiovascular: Negative for chest pain, palpitations and leg swelling.  Gastrointestinal: Negative for abdominal distention, abdominal pain, blood in stool, constipation, diarrhea, nausea and vomiting.  Genitourinary: Negative for difficulty urinating, discharge, flank pain, genital sores, hematuria and urgency.  Musculoskeletal: Positive for arthralgias, back pain, neck pain and neck stiffness. Negative for gait problem, joint swelling and myalgias.  Skin: Negative for rash.  Neurological: Negative for dizziness, syncope, speech difficulty, weakness, numbness and headaches.  Hematological: Negative for adenopathy. Does not bruise/bleed easily.  Psychiatric/Behavioral: Negative for behavioral problems and dysphoric mood. The patient is not nervous/anxious.    Medicare wellness visit  1. Risk factors, based on past  M,S,F history.  Risk factors include hypertension and dyslipidemia.  He has a history of peripheral vascular disease and carotid artery stenosis.  History of TIA  2.  Physical activities:walks daily.  Exercises at least 3 times per week  3.  Depression/mood:no history of major depression or mood disorder  4.  Hearing:no deficits  5.  ADL's:independent  6.  Fall risk:low  7.  Home safety:no problems identified  8.  Height weight, and visual acuity;height and weight stable no change in visual acuity is planning to see ophthalmology later this month  9.  Counseling:continue heart healthy diet and regular exercise  10. Lab orders based on risk factors:laboratory studies from cardiology reviewed  11. Referral :needs carotid  artery Doppler studies  12. Care plan:continue efforts at aggressive risk factor modification  13. Cognitive assessment: alert and oriented with normal affect no cognitive dysfunction  14. Screening: Patient provided with a written and personalized 5-10 year screening schedule in the AVS.    15. Provider List Update: primary care cardiology, ophthalmology and orthopedics as well as dermatology and podiatry      Objective:   Physical Exam  Constitutional: He appears well-developed and well-nourished.  HENT:  Head: Normocephalic and atraumatic.  Right Ear: External ear normal.  Left Ear: External ear normal.  Nose: Nose normal.  Mouth/Throat: Oropharynx is clear and moist.  Cerumen left canal  Eyes: Conjunctivae and EOM are normal. Pupils are equal, round, and reactive to light. No scleral icterus.  Neck: Neck supple. No JVD present. No thyromegaly present.  Marked decreased range of motion  Cardiovascular: Regular rhythm, normal heart sounds and intact distal pulses.  Exam reveals no gallop and no friction rub.   No murmur heard. Dorsalis pedis pulses intact.  Posterior tibial pulses not easily palpable  Pulmonary/Chest: Effort normal and breath sounds normal. He exhibits no tenderness.  Abdominal: Soft. Bowel sounds are normal. He exhibits no distension and no mass. There is no tenderness.  Genitourinary: Prostate normal and penis normal.  Musculoskeletal: Normal range of motion. He exhibits no edema or tenderness.  Lymphadenopathy:    He has no cervical adenopathy.  Neurological: He is alert. He has normal reflexes. No cranial nerve deficit. Coordination normal.  Skin: Skin is warm and dry. No rash noted.  onychomycotic toenail changes  Psychiatric: He has a normal mood and affect. His behavior is normal.          Assessment & Plan:   Medicare wellness visit Dyslipidemia.  Continue  atorvastatin Essential hypertension, stable BPH Osteoarthritis Ankylosing  spondylitis   Continue home blood pressure monitoring.  Low-salt diet  Cardiology follow-up Recheck here one year  Nyoka Cowden

## 2016-10-31 NOTE — Progress Notes (Signed)
Pre visit review using our clinic review tool, if applicable. No additional management support is needed unless otherwise documented below in the visit note. 

## 2016-10-31 NOTE — Patient Instructions (Signed)
Limit your sodium (Salt) intake    It is important that you exercise regularly, at least 20 minutes 3 to 4 times per week.  If you develop chest pain or shortness of breath seek  medical attention.  Carotid artery Doppler study as discussed  Follow-up here one year or as needed  Please check your blood pressure on a regular basis.  If it is consistently greater than 150/90, please make an office appointment.

## 2016-11-01 ENCOUNTER — Ambulatory Visit: Payer: Medicare Other | Admitting: *Deleted

## 2016-11-01 DIAGNOSIS — M542 Cervicalgia: Secondary | ICD-10-CM | POA: Diagnosis not present

## 2016-11-01 DIAGNOSIS — R293 Abnormal posture: Secondary | ICD-10-CM | POA: Diagnosis not present

## 2016-11-01 DIAGNOSIS — M546 Pain in thoracic spine: Secondary | ICD-10-CM

## 2016-11-01 NOTE — Therapy (Signed)
[facility] [place], [place], [address] Phone: [phone]   Fax:  [phone]  Physical Therapy Treatment  Patient Details  Name: [name] MRN: [id number] Date of Birth: 05-11-1936 Referring Provider: Jean Rosenthal MD.  Encounter Date: 11/01/2016      PT End of Session - 11/01/16 1027    Visit Number 4   Number of Visits 16   Date for PT Re-Evaluation 12/20/16   PT Start Time 0902   PT Stop Time 0952   PT Time Calculation (min) 50 min      Past Medical History:  Diagnosis Date  . Ankylosing spondylitis ([place]) dx'd ~ 1974  . Arthritis    "back" (08/10/2015)  . BENIGN PROSTATIC HYPERTROPHY, WITH OBSTRUCTION 01/15/2010  . Bleeding duodenal ulcer   . Bleeding esophageal ulcer   . Bleeding stomach ulcer   . GERD 02/22/2008  . History of blood transfusion 1988   "lost ~ 1/2 of my blood volume from multiple bleeding ulcers"  . History of hiatal hernia   . HYPERGLYCEMIA 11/18/2007  . HYPERLIPIDEMIA 02/22/2008  . HYPERTENSION, UNSPECIFIED 11/10/2009  . Kidney stones   . Osteoarthritis    c-spine  . PEPTIC ULCER DISEASE 11/17/2007  . Situational depression    "son died in [place] 07/18/15"  . TIA (transient ischemic attack)    "not that I know of in the past; they are trying to determine if I've had one today (08/10/2015)"    Past Surgical History:  Procedure Laterality Date  . CATARACT EXTRACTION W/ INTRAOCULAR LENS  IMPLANT, BILATERAL Bilateral 2013    There were no vitals filed for this visit.      Subjective Assessment - 11/01/16 0906    Subjective Patient reports a sore spot in L flank from overactivity yesterday at home.   Limitations Sitting   How long can you sit comfortably? 20 minutes.   Patient Stated Goals Reduce pain and sleep better.   Currently in Pain? Yes   Pain Score 3    Pain Location Neck   Pain Descriptors / Indicators Aching   Pain Type Chronic pain   Pain Frequency Constant                          [facility] Adult PT Treatment/Exercise - 11/01/16 0001      Exercises   Exercises Neck;Lumbar;Shoulder     Neck Exercises: Machines for Strengthening   UBE (Upper Arm Bike) 120 RPM x5 min with forward and backward     Neck Exercises: Seated   Shoulder ABduction Both;20 reps  horizontal  yellow     Neck Exercises: Supine   Neck Retraction 20 reps;5 secs   Cervical Rotation Both;10 reps   Cervical Rotation Limitations with 5 sec hold     Shoulder Exercises: Standing   External Rotation Strengthening;Both;20 reps  yellow tband same time   Row Strengthening;Both;20 reps;Theraband  yellow     Shoulder Exercises: Pulleys   Flexion --  5 mins     Shoulder Exercises: Stretch   Other Shoulder Stretches Standing posture stretch in door Jam  with UEs reaching back     Modalities   Modalities Electrical Stimulation;Moist Heat     Moist Heat Therapy   Number Minutes Moist Heat 15 Minutes   Moist Heat Location Cervical     Electrical Stimulation   Electrical Stimulation Location cervical  IFC x 15 mins 80-150hz    Electrical Stimulation Goals Pain  PT Short Term Goals - 10/21/16 1554      PT SHORT TERM GOAL #1   Title STG's=LTG's.           PT Long Term Goals - 10/21/16 1554      PT LONG TERM GOAL #1   Title Bilateral active cervical rotation= 60-65. degrees so he can turn his head more easiliy while driving.   Time 8   Period Weeks   Status New     PT LONG TERM GOAL #2   Title Sleep 6 hours undisturbed.   Time 8   Period Weeks   Status New     PT LONG TERM GOAL #3   Title Perform ADL's with pain not > 3/10.   Time 8   Period Weeks   Status New     PT LONG TERM GOAL #4   Title Eliminate headaches.   Time 8   Period Weeks   Status New               Plan - 11/01/16 0920    Clinical Impression Statement Pt arrived today doing a little better with posture awareness and reminds himself  to keep his head up. He was able to perform all posture exs with minimal increase in discomfort. He needs VC's for technique during Exs.   Rehab Potential Good   PT Frequency 2x / week   PT Duration 8 weeks   PT Treatment/Interventions ADLs/Self Care Home Management;Cryotherapy;Electrical Stimulation;Moist Heat;Ultrasound;Therapeutic activities;Therapeutic exercise;Patient/family education;Passive range of motion;Manual techniques;Dry needling   PT Next Visit Plan Continue postural strengthening and stretching as well as cervical and trunk rotation exercises per MPT POC.   PT Home Exercise Plan supine lying in cross position with chin tucks   Consulted and Agree with Plan of Care Patient      Patient will benefit from skilled therapeutic intervention in order to improve the following deficits and impairments:  Pain, Decreased activity tolerance, Decreased range of motion, Postural dysfunction, Decreased strength  Visit Diagnosis: Cervicalgia  Pain in thoracic spine  Abnormal posture     Problem List Patient Active Problem List   Diagnosis Date Noted  . Ankylosing spondylitis of cervicothoracic region ([place]) 10/16/2016  . TIA (transient ischemic attack) 08/10/2015  . Carotid stenosis 08/04/2014  . Hyperlipidemia 02/21/2012  . BENIGN PROSTATIC HYPERTROPHY, WITH OBSTRUCTION 01/15/2010  . Essential hypertension 11/10/2009  . GERD 02/22/2008  . NEPHROLITHIASIS, HX OF 11/17/2007    Amadi Frady,CHRIS, PTA 11/01/2016, 10:28 AM  [facility] [place], [place], [address] Phone: [phone]   Fax:  [phone]  Name: BERNIS SCHREUR MRN: [id number] Date of Birth: 29-Dec-1935

## 2016-11-05 ENCOUNTER — Ambulatory Visit: Payer: Medicare Other | Admitting: Physical Therapy

## 2016-11-05 ENCOUNTER — Encounter: Payer: Self-pay | Admitting: Physical Therapy

## 2016-11-05 ENCOUNTER — Encounter: Payer: Medicare Other | Admitting: *Deleted

## 2016-11-05 DIAGNOSIS — M542 Cervicalgia: Secondary | ICD-10-CM | POA: Diagnosis not present

## 2016-11-05 DIAGNOSIS — M546 Pain in thoracic spine: Secondary | ICD-10-CM

## 2016-11-05 DIAGNOSIS — R293 Abnormal posture: Secondary | ICD-10-CM | POA: Diagnosis not present

## 2016-11-05 NOTE — Therapy (Signed)
[facility] [place], [place], [address] Phone: [phone]   Fax:  [phone]  Physical Therapy Treatment  Patient Details  Name: [name] MRN: [id number] Date of Birth: [birth year] Referring Provider: Jean Rosenthal MD.  Encounter Date: 11/05/2016      PT End of Session - 11/05/16 1559    Visit Number 5   Number of Visits 16   Date for PT Re-Evaluation 12/20/16   PT Start Time 0630   PT Stop Time 1642   PT Time Calculation (min) 48 min   Activity Tolerance Patient tolerated treatment well   Behavior During Therapy [facility] for tasks assessed/performed      Past Medical History:  Diagnosis Date  . Ankylosing spondylitis ([place]) dx'd ~ 1974  . Arthritis    "back" (08/10/2015)  . BENIGN PROSTATIC HYPERTROPHY, WITH OBSTRUCTION 01/15/2010  . Bleeding duodenal ulcer   . Bleeding esophageal ulcer   . Bleeding stomach ulcer   . GERD 02/22/2008  . History of blood transfusion 1988   "lost ~ 1/2 of my blood volume from multiple bleeding ulcers"  . History of hiatal hernia   . HYPERGLYCEMIA 11/18/2007  . HYPERLIPIDEMIA 02/22/2008  . HYPERTENSION, UNSPECIFIED 11/10/2009  . Kidney stones   . Osteoarthritis    c-spine  . PEPTIC ULCER DISEASE 11/17/2007  . Situational depression    "son died in [place] 06/29/2015"  . TIA (transient ischemic attack)    "not that I know of in the past; they are trying to determine if I've had one today (08/10/2015)"    Past Surgical History:  Procedure Laterality Date  . CATARACT EXTRACTION W/ INTRAOCULAR LENS  IMPLANT, BILATERAL Bilateral 2013    There were no vitals filed for this visit.      Subjective Assessment - 11/05/16 1559    Subjective Reports intermittant L sided neck pain with no known cause. Reports greatest limitation with B cervical rotations and cervical extension.   Limitations Sitting   How long can you sit comfortably? 20 minutes.   Patient Stated Goals Reduce pain and  sleep better.   Currently in Pain? No/denies            [facility] PT Assessment - 11/05/16 0001      Assessment   Medical Diagnosis Ankylosing Spondylitis.     Precautions   Precautions None     Restrictions   Weight Bearing Restrictions No                     [facility] Adult PT Treatment/Exercise - 11/05/16 0001      Neck Exercises: Standing   Wall Push Ups 20 reps   Other Standing Exercises Wall walks yellow theraband x1 RT     Neck Exercises: Supine   Cervical Rotation Both;15 reps   Cervical Rotation Limitations with 5 sec hold   Shoulder Flexion Both;15 reps   Shoulder Flexion Limitations Yellow theraband   Upper Extremity Flexion with Stabilization Flexion;20 reps   UE Flexion with Stabilization Limitations prolonged hold at end range     Lumbar Exercises: Aerobic   Stationary Bike NuStep L7 x15 min     Shoulder Exercises: Standing   Horizontal ABduction Strengthening;Both;20 reps;Theraband   Theraband Level (Shoulder Horizontal ABduction) Level 1 (Yellow)   External Rotation Strengthening;Both;20 reps;Theraband   Theraband Level (Shoulder External Rotation) Level 1 (Yellow)     Shoulder Exercises: Stretch   Corner Stretch 3 reps;30 seconds  PT Short Term Goals - 10/21/16 1554      PT SHORT TERM GOAL #1   Title STG's=LTG's.           PT Long Term Goals - 10/21/16 1554      PT LONG TERM GOAL #1   Title Bilateral active cervical rotation= 60-65. degrees so he can turn his head more easiliy while driving.   Time 8   Period Weeks   Status New     PT LONG TERM GOAL #2   Title Sleep 6 hours undisturbed.   Time 8   Period Weeks   Status New     PT LONG TERM GOAL #3   Title Perform ADL's with pain not > 3/10.   Time 8   Period Weeks   Status New     PT LONG TERM GOAL #4   Title Eliminate headaches.   Time 8   Period Weeks   Status New               Plan - 11/05/16 1653    Clinical Impression  Statement Patient tolerated today's treatment well with no reports of any increased pain. Patient was guided through postural strengthening with varying resistance bands. Patient required moderate multimodal cueing for proper exercise technique. Patient encouraged to complete supine cervcial rotation to improve cervical rotation for ADLs such as driving.  Patient demonstrates greater limitation with L cervical rotation than R cervical rotation today.     Rehab Potential Good   PT Frequency 2x / week   PT Duration 8 weeks   PT Treatment/Interventions ADLs/Self Care Home Management;Cryotherapy;Electrical Stimulation;Moist Heat;Ultrasound;Therapeutic activities;Therapeutic exercise;Patient/family education;Passive range of motion;Manual techniques;Dry needling   PT Next Visit Plan Continue postural strengthening and stretching as well as cervical and trunk rotation exercises per MPT POC.   PT Home Exercise Plan supine lying in cross position with chin tucks   Consulted and Agree with Plan of Care Patient      Patient will benefit from skilled therapeutic intervention in order to improve the following deficits and impairments:  Pain, Decreased activity tolerance, Decreased range of motion, Postural dysfunction, Decreased strength  Visit Diagnosis: Cervicalgia  Pain in thoracic spine  Abnormal posture     Problem List Patient Active Problem List   Diagnosis Date Noted  . Ankylosing spondylitis of cervicothoracic region ([place]) 10/16/2016  . TIA (transient ischemic attack) 08/10/2015  . Carotid stenosis 08/04/2014  . Hyperlipidemia 02/21/2012  . BENIGN PROSTATIC HYPERTROPHY, WITH OBSTRUCTION 01/15/2010  . Essential hypertension 11/10/2009  . GERD 02/22/2008  . NEPHROLITHIASIS, HX OF 11/17/2007    Wynelle Fanny, PTA 11/05/2016, 5:07 PM  [facility] [place], [place], [address] Phone: [phone]   Fax:  [phone]  Name:  [name] MRN: [id number] Date of Birth: [birth year]

## 2016-11-08 ENCOUNTER — Ambulatory Visit: Payer: Medicare Other | Admitting: *Deleted

## 2016-11-08 DIAGNOSIS — R293 Abnormal posture: Secondary | ICD-10-CM | POA: Diagnosis not present

## 2016-11-08 DIAGNOSIS — M546 Pain in thoracic spine: Secondary | ICD-10-CM | POA: Diagnosis not present

## 2016-11-08 DIAGNOSIS — M542 Cervicalgia: Secondary | ICD-10-CM | POA: Diagnosis not present

## 2016-11-08 NOTE — Therapy (Signed)
[facility] [place], [place], [address] Phone: [phone]   Fax:  [phone]  Physical Therapy Treatment  Patient Details  Name: [name] MRN: [id number] Date of Birth: [birth year] Referring Provider: Jean Rosenthal MD.  Encounter Date: 11/08/2016      PT End of Session - 11/08/16 0910    Visit Number 6   Number of Visits 16   Date for PT Re-Evaluation 12/20/16   PT Start Time 0906   PT Stop Time 0953   PT Time Calculation (min) 47 min      Past Medical History:  Diagnosis Date  . Ankylosing spondylitis ([place]) dx'd ~ 1974  . Arthritis    "back" (08/10/2015)  . BENIGN PROSTATIC HYPERTROPHY, WITH OBSTRUCTION 01/15/2010  . Bleeding duodenal ulcer   . Bleeding esophageal ulcer   . Bleeding stomach ulcer   . GERD 02/22/2008  . History of blood transfusion 1988   "lost ~ 1/2 of my blood volume from multiple bleeding ulcers"  . History of hiatal hernia   . HYPERGLYCEMIA 11/18/2007  . HYPERLIPIDEMIA 02/22/2008  . HYPERTENSION, UNSPECIFIED 11/10/2009  . Kidney stones   . Osteoarthritis    c-spine  . PEPTIC ULCER DISEASE 11/17/2007  . Situational depression    "son died in [place] Jul 05, 2015"  . TIA (transient ischemic attack)    "not that I know of in the past; they are trying to determine if I've had one today (08/10/2015)"    Past Surgical History:  Procedure Laterality Date  . CATARACT EXTRACTION W/ INTRAOCULAR LENS  IMPLANT, BILATERAL Bilateral 2013    There were no vitals filed for this visit.      Subjective Assessment - 11/08/16 0910    Subjective Reports intermittant L sided neck pain with no known cause. Reports greatest limitation with B cervical rotations and cervical extension. Did good after last Rx   Limitations Sitting   How long can you sit comfortably? 20 minutes.   Patient Stated Goals Reduce pain and sleep better.   Currently in Pain? No/denies                          [facility] Adult PT Treatment/Exercise - 11/08/16 0001      Exercises   Exercises Neck;Lumbar;Shoulder     Neck Exercises: Machines for Strengthening   UBE (Upper Arm Bike) 120 RPM x 6 min with forward and backward     Neck Exercises: Standing   Wall Push Ups --     Neck Exercises: Supine   Cervical Rotation Both;15 reps     Lumbar Exercises: Aerobic   Stationary Bike NuStep L7 x15 min     Lumbar Exercises: Standing   Row Strengthening;Both;20 reps  Pink XTS 4x10  cues for posture     Shoulder Exercises: Standing   Horizontal ABduction Strengthening;Both;20 reps;Theraband;10 reps   Theraband Level (Shoulder Horizontal ABduction) Level 1 (Yellow)   External Rotation Strengthening;Both;20 reps;Theraband;10 reps   Theraband Level (Shoulder External Rotation) Level 1 (Yellow)     Shoulder Exercises: Pulleys   Flexion --  5 mins   focus on posture     Shoulder Exercises: Stretch   Corner Stretch --                  PT Short Term Goals - 10/21/16 1554      PT SHORT TERM GOAL #1   Title STG's=LTG's.  PT Long Term Goals - 11/08/16 8182      PT LONG TERM GOAL #1   Title Bilateral active cervical rotation= 60-65. degrees so he can turn his head more easiliy while driving.   Time 8   Period Weeks   Status On-going     PT LONG TERM GOAL #2   Title Sleep 6 hours undisturbed.   Time 8   Period Weeks   Status On-going     PT LONG TERM GOAL #3   Title Perform ADL's with pain not > 3/10.   Time 8   Period Weeks   Status On-going     PT LONG TERM GOAL #4   Title Eliminate headaches.   Time 8   Period Weeks   Status Achieved               Plan - 11/08/16 0957    Clinical Impression Statement  Pt arrived to clinic today feeling fairly well and doing better. He was able to perform all postural exs without complaints of increased pain.  He continue to  require V/Cs for correct posture during some exs.  His cervical rot ROM was 58 degrees LT  and 55 degrees RT and was unable to meet LTG yet.   Rehab Potential Good   PT Frequency 2x / week   PT Duration 8 weeks   PT Treatment/Interventions ADLs/Self Care Home Management;Cryotherapy;Electrical Stimulation;Moist Heat;Ultrasound;Therapeutic activities;Therapeutic exercise;Patient/family education;Passive range of motion;Manual techniques;Dry needling   PT Next Visit Plan Continue postural strengthening and stretching as well as cervical and trunk rotation exercises per MPT POC.   PT Home Exercise Plan supine lying in cross position with chin tucks   Consulted and Agree with Plan of Care Patient      Patient will benefit from skilled therapeutic intervention in order to improve the following deficits and impairments:  Pain, Decreased activity tolerance, Decreased range of motion, Postural dysfunction, Decreased strength  Visit Diagnosis: Cervicalgia  Pain in thoracic spine  Abnormal posture     Problem List Patient Active Problem List   Diagnosis Date Noted  . Ankylosing spondylitis of cervicothoracic region ([place]) 10/16/2016  . TIA (transient ischemic attack) 08/10/2015  . Carotid stenosis 08/04/2014  . Hyperlipidemia 02/21/2012  . BENIGN PROSTATIC HYPERTROPHY, WITH OBSTRUCTION 01/15/2010  . Essential hypertension 11/10/2009  . GERD 02/22/2008  . NEPHROLITHIASIS, HX OF 11/17/2007    Fuad Forget,CHRIS, PTA 11/08/2016, 10:29 AM  [facility] [place], [place], [address] Phone: [phone]   Fax:  [phone]  Name: TIRTH COTHRON MRN: [id number] Date of Birth: 1936/02/10

## 2016-11-11 ENCOUNTER — Ambulatory Visit: Payer: Medicare Other | Admitting: Physical Therapy

## 2016-11-11 DIAGNOSIS — R293 Abnormal posture: Secondary | ICD-10-CM

## 2016-11-11 DIAGNOSIS — M546 Pain in thoracic spine: Secondary | ICD-10-CM | POA: Diagnosis not present

## 2016-11-11 DIAGNOSIS — M542 Cervicalgia: Secondary | ICD-10-CM | POA: Diagnosis not present

## 2016-11-11 NOTE — Therapy (Signed)
[facility] [place], [place], [address] Phone: [phone]   Fax:  [phone]  Physical Therapy Treatment  Patient Details  Name: [name] MRN: [id number] Date of Birth: 05/21/1936 Referring Provider: Jean Rosenthal MD.  Encounter Date: 11/11/2016      PT End of Session - 11/11/16 1646    Visit Number 7   Number of Visits 16   Date for PT Re-Evaluation 12/20/16   PT Start Time 0400   PT Stop Time 0452   PT Time Calculation (min) 52 min   Activity Tolerance Patient tolerated treatment well   Behavior During Therapy [facility] for tasks assessed/performed      Past Medical History:  Diagnosis Date  . Ankylosing spondylitis ([place]) dx'd ~ 1974  . Arthritis    "back" (08/10/2015)  . BENIGN PROSTATIC HYPERTROPHY, WITH OBSTRUCTION 01/15/2010  . Bleeding duodenal ulcer   . Bleeding esophageal ulcer   . Bleeding stomach ulcer   . GERD 02/22/2008  . History of blood transfusion 1988   "lost ~ 1/2 of my blood volume from multiple bleeding ulcers"  . History of hiatal hernia   . HYPERGLYCEMIA 11/18/2007  . HYPERLIPIDEMIA 02/22/2008  . HYPERTENSION, UNSPECIFIED 11/10/2009  . Kidney stones   . Osteoarthritis    c-spine  . PEPTIC ULCER DISEASE 11/17/2007  . Situational depression    "son died in [place] 07-20-2015"  . TIA (transient ischemic attack)    "not that I know of in the past; they are trying to determine if I've had one today (08/10/2015)"    Past Surgical History:  Procedure Laterality Date  . CATARACT EXTRACTION W/ INTRAOCULAR LENS  IMPLANT, BILATERAL Bilateral 2013    There were no vitals filed for this visit.      Subjective Assessment - 11/11/16 1647    Subjective I'm doing better but I may undome what your doing if I have to lift a compressor at home.  I have a flat tire on my RV that I need to change.   Pain Score 3    Pain Location Neck   Pain Orientation Right;Left   Pain Descriptors / Indicators  Aching   Pain Onset More than a month ago                         [facility] Adult PT Treatment/Exercise - 11/11/16 0001      Exercises   Exercises Shoulder;Knee/Hip     Neck Exercises: Machines for Strengthening   UBE (Upper Arm Bike) 120 RPM's x 8 minutes.     Lumbar Exercises: Aerobic   Stationary Bike Nustep level 7 x 15 minutes.     Shoulder Exercises: Pulleys   Flexion Limitations 5 minutes.     Modalities   Modalities Electrical Stimulation;Moist Heat     Moist Heat Therapy   Number Minutes Moist Heat 19 Minutes   Moist Heat Location --  Upper thoracic and bilateral UT's.     Acupuncturist Location Upper thoracic and cervical.   Electrical Stimulation Action IFC   Electrical Stimulation Parameters 80-150 Hz x 19 minutes.   Electrical Stimulation Goals Pain                  PT Short Term Goals - 10/21/16 1554      PT SHORT TERM GOAL #1   Title STG's=LTG's.           PT Long Term Goals -  11/08/16 0958      PT LONG TERM GOAL #1   Title Bilateral active cervical rotation= 60-65. degrees so he can turn his head more easiliy while driving.   Time 8   Period Weeks   Status On-going     PT LONG TERM GOAL #2   Title Sleep 6 hours undisturbed.   Time 8   Period Weeks   Status On-going     PT LONG TERM GOAL #3   Title Perform ADL's with pain not > 3/10.   Time 8   Period Weeks   Status On-going     PT LONG TERM GOAL #4   Title Eliminate headaches.   Time 8   Period Weeks   Status Achieved               Plan - 11/11/16 1649    Clinical Impression Statement Patient pleased with his progress thus far.      Patient will benefit from skilled therapeutic intervention in order to improve the following deficits and impairments:  Pain, Decreased activity tolerance, Decreased range of motion, Postural dysfunction, Decreased strength  Visit Diagnosis: Cervicalgia  Pain in thoracic  spine  Abnormal posture     Problem List Patient Active Problem List   Diagnosis Date Noted  . Ankylosing spondylitis of cervicothoracic region ([place]) 10/16/2016  . TIA (transient ischemic attack) 08/10/2015  . Carotid stenosis 08/04/2014  . Hyperlipidemia 02/21/2012  . BENIGN PROSTATIC HYPERTROPHY, WITH OBSTRUCTION 01/15/2010  . Essential hypertension 11/10/2009  . GERD 02/22/2008  . NEPHROLITHIASIS, HX OF 11/17/2007    Jakai Onofre, [name] MPT 11/11/2016, 5:05 PM  [facility] [address] [place], [place], [address] Phone: [phone]   Fax:  [phone]  Name: [name] MRN: [id number] Date of Birth: [birth year]

## 2016-11-13 ENCOUNTER — Ambulatory Visit (INDEPENDENT_AMBULATORY_CARE_PROVIDER_SITE_OTHER): Payer: Medicare Other | Admitting: Orthopaedic Surgery

## 2016-11-14 ENCOUNTER — Ambulatory Visit (INDEPENDENT_AMBULATORY_CARE_PROVIDER_SITE_OTHER): Payer: Medicare Other | Admitting: Orthopaedic Surgery

## 2016-11-14 ENCOUNTER — Encounter (INDEPENDENT_AMBULATORY_CARE_PROVIDER_SITE_OTHER): Payer: Self-pay | Admitting: Orthopaedic Surgery

## 2016-11-14 DIAGNOSIS — I6523 Occlusion and stenosis of bilateral carotid arteries: Secondary | ICD-10-CM | POA: Diagnosis not present

## 2016-11-14 DIAGNOSIS — M453 Ankylosing spondylitis of cervicothoracic region: Secondary | ICD-10-CM | POA: Diagnosis not present

## 2016-11-14 MED ORDER — MELOXICAM 15 MG PO TABS: 15.0000 mg | ORAL_TABLET | Freq: Every day | ORAL | 0 refills | Status: DC

## 2016-11-14 NOTE — Progress Notes (Signed)
The patient is well-known to me. He is a 81 year old gentleman with ankylosing spondylitis. We saw him at last visit for his cervical spine. We've had him on meloxicam daily and it's in her to physical therapy. Is been going twice a week. He says that the therapy and meloxicam helped him quite a bit and is doing much better overall.  He does have improved range of motion of the cervical spine. It is certainly stiff due to the disease process of ankylosing spondylitis but is much less so. His pain is minimal.  He would like a 90 day supply of the meloxicam. He understands that he should take this as needed and have days where he gets his body rest from this medication. He is on Pepcid twice a day as well. He would like to consider continuing physical therapy for up to 16 weeks which I think is certainly reasonable given his disease process and how well therapy is done for him. I've told with the therapist know if anything we need to do 2 extending continues physical therapy patient not hesitate to let us know so we can do the appropriate orders for this.

## 2016-11-15 ENCOUNTER — Ambulatory Visit: Payer: Medicare Other | Admitting: Physical Therapy

## 2016-11-15 ENCOUNTER — Encounter: Payer: Self-pay | Admitting: Physical Therapy

## 2016-11-15 DIAGNOSIS — M542 Cervicalgia: Secondary | ICD-10-CM

## 2016-11-15 DIAGNOSIS — R293 Abnormal posture: Secondary | ICD-10-CM

## 2016-11-15 DIAGNOSIS — M546 Pain in thoracic spine: Secondary | ICD-10-CM | POA: Diagnosis not present

## 2016-11-15 NOTE — Therapy (Signed)
[facility] [place], [place], [address] Phone: [phone]   Fax:  [phone]  Physical Therapy Treatment  Patient Details  Name: [name] MRN: [id number] Date of Birth: September 23, 1935 Referring Provider: Jean Rosenthal MD.  Encounter Date: 11/15/2016      PT End of Session - 11/15/16 1257    Visit Number 8   Number of Visits 16   Date for PT Re-Evaluation 12/20/16   PT Start Time 1200   PT Stop Time 1250   PT Time Calculation (min) 50 min   Activity Tolerance Patient tolerated treatment well   Behavior During Therapy [facility] for tasks assessed/performed      Past Medical History:  Diagnosis Date  . Ankylosing spondylitis ([place]) dx'd ~ 1974  . Arthritis    "back" (08/10/2015)  . BENIGN PROSTATIC HYPERTROPHY, WITH OBSTRUCTION 01/15/2010  . Bleeding duodenal ulcer   . Bleeding esophageal ulcer   . Bleeding stomach ulcer   . GERD 02/22/2008  . History of blood transfusion 1988   "lost ~ 1/2 of my blood volume from multiple bleeding ulcers"  . History of hiatal hernia   . HYPERGLYCEMIA 11/18/2007  . HYPERLIPIDEMIA 02/22/2008  . HYPERTENSION, UNSPECIFIED 11/10/2009  . Kidney stones   . Osteoarthritis    c-spine  . PEPTIC ULCER DISEASE 11/17/2007  . Situational depression    "son died in [place] July 03, 2015"  . TIA (transient ischemic attack)    "not that I know of in the past; they are trying to determine if I've had one today (08/10/2015)"    Past Surgical History:  Procedure Laterality Date  . CATARACT EXTRACTION W/ INTRAOCULAR LENS  IMPLANT, BILATERAL Bilateral 2013    There were no vitals filed for this visit.      Subjective Assessment - 11/15/16 1255    Subjective Pt arriving to therapy reportin 2/10 pain in his neck.    Limitations Sitting   How long can you sit comfortably? 20 minutes.   Patient Stated Goals Reduce pain and sleep better.   Pain Score 2    Pain Location Neck   Pain Orientation  Right;Left   Pain Descriptors / Indicators Aching   Pain Type Chronic pain   Pain Onset More than a month ago   Aggravating Factors  sitting, lifting   Pain Relieving Factors after finishing with therapy                         [facility] Adult PT Treatment/Exercise - 11/15/16 0001      Neck Exercises: Machines for Strengthening   UBE (Upper Arm Bike) 120 RPM's x 5 minutes.     Neck Exercises: Standing   Other Standing Exercises Standing Rows with orange cords x 20 reps holding 3 seconds each   Other Standing Exercises wall angles x 5 reps     Neck Exercises: Supine   Other Supine Exercise Lying in open book position holding elbows and arms at 90 degrees for 3 minutes.      Lumbar Exercises: Aerobic   Stationary Bike Nustep level 7 x 15 minutes.     Shoulder Exercises: Pulleys   Flexion Limitations 4 minutes     Modalities   Modalities Electrical Stimulation     Moist Heat Therapy   Number Minutes Moist Heat 19 Minutes   Moist Heat Location Cervical     Electrical Stimulation   Electrical Stimulation Location upper thoracic   Electrical Stimulation Action IFC  Electrical Stimulation Parameters 50-150 Hz x 15 minutes   Electrical Stimulation Goals Pain                PT Education - 11/15/16 1256    Education Details Open book stretch in supine with towel roll under pt's spine   Person(s) Educated Patient   Methods Explanation;Demonstration   Comprehension Verbalized understanding;Returned demonstration          PT Short Term Goals - 10/21/16 1554      PT SHORT TERM GOAL #1   Title STG's=LTG's.           PT Long Term Goals - 11/15/16 1259      PT LONG TERM GOAL #1   Title Bilateral active cervical rotation= 60-65. degrees so he can turn his head more easiliy while driving.   Time 8   Period Weeks   Status On-going     PT LONG TERM GOAL #2   Title Sleep 6 hours undisturbed.   Time 8   Period Weeks   Status On-going     PT  LONG TERM GOAL #3   Title Perform ADL's with pain not > 3/10.   Time 8   Period Weeks   Status On-going     PT LONG TERM GOAL #4   Title Eliminate headaches.   Time 8   Period Weeks   Status Achieved               Plan - 11/15/16 1257    Clinical Impression Statement Patient tolerated therapy session well. Pt was issued a new thoroacic spine open book stretch this visit. Pt requiring no rest breaks during session. Pt tolerated IFC at end of treatment and reported no pain following session.    Rehab Potential Good   PT Frequency 2x / week   PT Duration 8 weeks   PT Treatment/Interventions ADLs/Self Care Home Management;Cryotherapy;Electrical Stimulation;Moist Heat;Ultrasound;Therapeutic activities;Therapeutic exercise;Patient/family education;Passive range of motion;Manual techniques;Dry needling   PT Next Visit Plan Continue postural strengthening and stretching as well as cervical and trunk rotation exercises per MPT POC.   PT Home Exercise Plan supine lying in cross position with chin tucks, open book thoracic stretch with towel roll under spine in supine position   Consulted and Agree with Plan of Care Patient      Patient will benefit from skilled therapeutic intervention in order to improve the following deficits and impairments:  Pain, Decreased activity tolerance, Decreased range of motion, Postural dysfunction, Decreased strength  Visit Diagnosis: Pain in thoracic spine  Cervicalgia  Abnormal posture     Problem List Patient Active Problem List   Diagnosis Date Noted  . Ankylosing spondylitis of cervicothoracic region ([place]) 10/16/2016  . TIA (transient ischemic attack) 08/10/2015  . Carotid stenosis 08/04/2014  . Hyperlipidemia 02/21/2012  . BENIGN PROSTATIC HYPERTROPHY, WITH OBSTRUCTION 01/15/2010  . Essential hypertension 11/10/2009  . GERD 02/22/2008  . NEPHROLITHIASIS, HX OF 11/17/2007    [name], MPT 11/15/2016, 1:01 PM  [facility] [place], [place], [address] Phone: [phone]   Fax:  [phone]  Name: [name] MRN: [id number] Date of Birth: 11/18/1935

## 2016-11-18 ENCOUNTER — Ambulatory Visit: Payer: Medicare Other | Admitting: Physical Therapy

## 2016-11-18 ENCOUNTER — Encounter: Payer: Self-pay | Admitting: Physical Therapy

## 2016-11-18 DIAGNOSIS — R293 Abnormal posture: Secondary | ICD-10-CM | POA: Diagnosis not present

## 2016-11-18 DIAGNOSIS — M546 Pain in thoracic spine: Secondary | ICD-10-CM | POA: Diagnosis not present

## 2016-11-18 DIAGNOSIS — M542 Cervicalgia: Secondary | ICD-10-CM | POA: Diagnosis not present

## 2016-11-18 NOTE — Therapy (Signed)
[facility] [place], [place], [address] Phone: [phone]   Fax:  [phone]  Physical Therapy Treatment  Patient Details  Name: [name] MRN: [id number] Date of Birth: 01/18/1936 Referring Provider: Jean Rosenthal MD.  Encounter Date: 11/18/2016      PT End of Session - 11/18/16 1030    Visit Number 9   Number of Visits 16   Date for PT Re-Evaluation 12/20/16   PT Start Time 0945   PT Stop Time 3382   PT Time Calculation (min) 59 min   Activity Tolerance Patient tolerated treatment well   Behavior During Therapy [facility] for tasks assessed/performed      Past Medical History:  Diagnosis Date  . Ankylosing spondylitis ([place]) dx'd ~ 1974  . Arthritis    "back" (08/10/2015)  . BENIGN PROSTATIC HYPERTROPHY, WITH OBSTRUCTION 01/15/2010  . Bleeding duodenal ulcer   . Bleeding esophageal ulcer   . Bleeding stomach ulcer   . GERD 02/22/2008  . History of blood transfusion 1988   "lost ~ 1/2 of my blood volume from multiple bleeding ulcers"  . History of hiatal hernia   . HYPERGLYCEMIA 11/18/2007  . HYPERLIPIDEMIA 02/22/2008  . HYPERTENSION, UNSPECIFIED 11/10/2009  . Kidney stones   . Osteoarthritis    c-spine  . PEPTIC ULCER DISEASE 11/17/2007  . Situational depression    "son died in [place] 2015-07-12"  . TIA (transient ischemic attack)    "not that I know of in the past; they are trying to determine if I've had one today (08/10/2015)"    Past Surgical History:  Procedure Laterality Date  . CATARACT EXTRACTION W/ INTRAOCULAR LENS  IMPLANT, BILATERAL Bilateral 2013    There were no vitals filed for this visit.      Subjective Assessment - 11/18/16 0954    Subjective Patient reported some stiffness after prolong bending position with RV tire   Limitations Sitting   How long can you sit comfortably? 20 minutes.   Patient Stated Goals Reduce pain and sleep better.   Currently in Pain? No/denies                          [facility] Adult PT Treatment/Exercise - 11/18/16 0001      Neck Exercises: Machines for Strengthening   UBE (Upper Arm Bike) 120 RPM's x 5 minutes.     Neck Exercises: Standing   Other Standing Exercises Standing Rows with orange cords x 20 reps holding 3 seconds each     Neck Exercises: Seated   Other Seated Exercise seated scap retration with soft boulster 2x10   Other Seated Exercise seated cervical rotation self stretch bil sides     Lumbar Exercises: Aerobic   Stationary Bike Nustep level 7 x 15 1/2 1000 steps     Shoulder Exercises: Pulleys   Flexion Limitations 4 minutes     Moist Heat Therapy   Number Minutes Moist Heat 15 Minutes   Moist Heat Location Cervical     Electrical Stimulation   Electrical Stimulation Location upper thoracic   Electrical Stimulation Action IFC   Electrical Stimulation Parameters 80-150hz  x52min   Electrical Stimulation Goals Pain                  PT Short Term Goals - 10/21/16 1554      PT SHORT TERM GOAL #1   Title STG's=LTG's.           PT Long  Term Goals - 11/15/16 1259      PT LONG TERM GOAL #1   Title Bilateral active cervical rotation= 60-65. degrees so he can turn his head more easiliy while driving.   Time 8   Period Weeks   Status On-going     PT LONG TERM GOAL #2   Title Sleep 6 hours undisturbed.   Time 8   Period Weeks   Status On-going     PT LONG TERM GOAL #3   Title Perform ADL's with pain not > 3/10.   Time 8   Period Weeks   Status On-going     PT LONG TERM GOAL #4   Title Eliminate headaches.   Time 8   Period Weeks   Status Achieved               Plan - 11/18/16 1031    Clinical Impression Statement Patient reported good response to therapy thus far. Patient tolerated treatment well overall and improving with activity tolerance. Patient able to complete all exercises with good technique. Patient continues to have lack of range of motion in  cervical spine. Today educated patient on gentle self streches to improve ROM and functional independence. Patient goals progressing yet ongoing due to ROM and posture deficts.    Rehab Potential Good   PT Frequency 2x / week   PT Duration 8 weeks   PT Treatment/Interventions ADLs/Self Care Home Management;Cryotherapy;Electrical Stimulation;Moist Heat;Ultrasound;Therapeutic activities;Therapeutic exercise;Patient/family education;Passive range of motion;Manual techniques;Dry needling   PT Next Visit Plan Continue postural strengthening and stretching as well as cervical and trunk rotation exercises per MPT POC.   Consulted and Agree with Plan of Care Patient      Patient will benefit from skilled therapeutic intervention in order to improve the following deficits and impairments:  Pain, Decreased activity tolerance, Decreased range of motion, Postural dysfunction, Decreased strength  Visit Diagnosis: Pain in thoracic spine  Cervicalgia  Abnormal posture     Problem List Patient Active Problem List   Diagnosis Date Noted  . Ankylosing spondylitis of cervicothoracic region ([place]) 10/16/2016  . TIA (transient ischemic attack) 08/10/2015  . Carotid stenosis 08/04/2014  . Hyperlipidemia 02/21/2012  . BENIGN PROSTATIC HYPERTROPHY, WITH OBSTRUCTION 01/15/2010  . Essential hypertension 11/10/2009  . GERD 02/22/2008  . NEPHROLITHIASIS, HX OF 11/17/2007    Phillips Climes, PTA 11/18/2016, 10:46 AM  [facility] [place], [place], [address] Phone: [phone]   Fax:  [phone]  Name: [name] MRN: [id number] Date of Birth: 10-27-1935

## 2016-11-19 ENCOUNTER — Encounter: Payer: PRIVATE HEALTH INSURANCE | Admitting: Internal Medicine

## 2016-11-19 DIAGNOSIS — Z961 Presence of intraocular lens: Secondary | ICD-10-CM | POA: Diagnosis not present

## 2016-11-19 DIAGNOSIS — H26492 Other secondary cataract, left eye: Secondary | ICD-10-CM | POA: Diagnosis not present

## 2016-11-19 DIAGNOSIS — H52203 Unspecified astigmatism, bilateral: Secondary | ICD-10-CM | POA: Diagnosis not present

## 2016-11-20 DIAGNOSIS — L570 Actinic keratosis: Secondary | ICD-10-CM | POA: Diagnosis not present

## 2016-11-20 DIAGNOSIS — D1801 Hemangioma of skin and subcutaneous tissue: Secondary | ICD-10-CM | POA: Diagnosis not present

## 2016-11-20 DIAGNOSIS — L821 Other seborrheic keratosis: Secondary | ICD-10-CM | POA: Diagnosis not present

## 2016-11-20 DIAGNOSIS — L812 Freckles: Secondary | ICD-10-CM | POA: Diagnosis not present

## 2016-11-20 DIAGNOSIS — D225 Melanocytic nevi of trunk: Secondary | ICD-10-CM | POA: Diagnosis not present

## 2016-11-20 DIAGNOSIS — L959 Vasculitis limited to the skin, unspecified: Secondary | ICD-10-CM | POA: Diagnosis not present

## 2016-11-21 ENCOUNTER — Encounter: Payer: Self-pay | Admitting: Physical Therapy

## 2016-11-21 ENCOUNTER — Ambulatory Visit: Payer: Medicare Other | Admitting: Physical Therapy

## 2016-11-21 DIAGNOSIS — M542 Cervicalgia: Secondary | ICD-10-CM | POA: Diagnosis not present

## 2016-11-21 DIAGNOSIS — M546 Pain in thoracic spine: Secondary | ICD-10-CM

## 2016-11-21 DIAGNOSIS — R293 Abnormal posture: Secondary | ICD-10-CM | POA: Diagnosis not present

## 2016-11-21 NOTE — Therapy (Signed)
[facility] [place], [place], [address] Phone: [phone]   Fax:  [phone]  Physical Therapy Treatment  Patient Details  Name: [name] MRN: [id number] Date of Birth: 08/04/1936 Referring Provider: Jean Rosenthal MD.  Encounter Date: 11/21/2016      PT End of Session - 11/21/16 1049    Visit Number 10   Number of Visits 16   Date for PT Re-Evaluation 12/20/16   PT Start Time 2423   PT Stop Time 1126   PT Time Calculation (min) 57 min   Activity Tolerance Patient tolerated treatment well   Behavior During Therapy [facility] for tasks assessed/performed      Past Medical History:  Diagnosis Date  . Ankylosing spondylitis ([place]) dx'd ~ 1974  . Arthritis    "back" (08/10/2015)  . BENIGN PROSTATIC HYPERTROPHY, WITH OBSTRUCTION 01/15/2010  . Bleeding duodenal ulcer   . Bleeding esophageal ulcer   . Bleeding stomach ulcer   . GERD 02/22/2008  . History of blood transfusion 1988   "lost ~ 1/2 of my blood volume from multiple bleeding ulcers"  . History of hiatal hernia   . HYPERGLYCEMIA 11/18/2007  . HYPERLIPIDEMIA 02/22/2008  . HYPERTENSION, UNSPECIFIED 11/10/2009  . Kidney stones   . Osteoarthritis    c-spine  . PEPTIC ULCER DISEASE 11/17/2007  . Situational depression    "son died in [place] 06/25/2015"  . TIA (transient ischemic attack)    "not that I know of in the past; they are trying to determine if I've had one today (08/10/2015)"    Past Surgical History:  Procedure Laterality Date  . CATARACT EXTRACTION W/ INTRAOCULAR LENS  IMPLANT, BILATERAL Bilateral 2013    There were no vitals filed for this visit.      Subjective Assessment - 11/21/16 1033    Subjective Patient reported doing well after last treatment today and overall good today   Limitations Sitting   How long can you sit comfortably? 20 minutes.   Patient Stated Goals Reduce pain and sleep better.   Currently in Pain? No/denies                          [facility] Adult PT Treatment/Exercise - 11/21/16 0001      Neck Exercises: Machines for Strengthening   UBE (Upper Arm Bike) 120 RPM's x 5 minutes.     Neck Exercises: Standing   Other Standing Exercises Standing Rows with pink XTS x 20 reps holding 3 seconds each   Other Standing Exercises open book stretch with grey ball behing thoracic 3x30sec     Lumbar Exercises: Aerobic   Stationary Bike Nustep level 7 x 15 1/2 1000 steps     Shoulder Exercises: Pulleys   Flexion Limitations 4 minutes     Moist Heat Therapy   Number Minutes Moist Heat 15 Minutes   Moist Heat Location Cervical     Electrical Stimulation   Electrical Stimulation Location upper thoracic   Electrical Stimulation Action IFC   Electrical Stimulation Parameters 80-'150hz'  x88mn   Electrical Stimulation Goals Pain                  PT Short Term Goals - 10/21/16 1554      PT SHORT TERM GOAL #1   Title STG's=LTG's.           PT Long Term Goals - 11/21/16 1114      PT LONG TERM GOAL #1  Title Bilateral active cervical rotation= 60-65. degrees so he can turn his head more easiliy while driving.   Time 8   Period Weeks   Status On-going     PT LONG TERM GOAL #2   Title Sleep 6 hours undisturbed.   Time 8   Period Weeks   Status Achieved     PT LONG TERM GOAL #3   Title Perform ADL's with pain not > 3/10.   Time 8   Period Weeks   Status Achieved     PT LONG TERM GOAL #4   Title Eliminate headaches.   Period Weeks   Status Achieved               Plan - 11/21/16 1104    Clinical Impression Statement Patient continues to progress with little to no pain complaints. Patient able to complete his daily task without a problem. Patient has reported improvement with posture activities. Patient has improved FOTO to 36% limitation (initial 47%) Patient met LTG #2 and #3 today ans other goal ongoing due to cervical rotation limitations.    Rehab  Potential Good   PT Frequency 2x / week   PT Duration 8 weeks   PT Treatment/Interventions ADLs/Self Care Home Management;Cryotherapy;Electrical Stimulation;Moist Heat;Ultrasound;Therapeutic activities;Therapeutic exercise;Patient/family education;Passive range of motion;Manual techniques;Dry needling   PT Next Visit Plan Continue postural strengthening and stretching as well as cervical and trunk rotation exercises per MPT POC.   Consulted and Agree with Plan of Care Patient      Patient will benefit from skilled therapeutic intervention in order to improve the following deficits and impairments:  Pain, Decreased activity tolerance, Decreased range of motion, Postural dysfunction, Decreased strength  Visit Diagnosis: Pain in thoracic spine  Cervicalgia  Abnormal posture     Problem List Patient Active Problem List   Diagnosis Date Noted  . Ankylosing spondylitis of cervicothoracic region ([place]) 10/16/2016  . TIA (transient ischemic attack) 08/10/2015  . Carotid stenosis 08/04/2014  . Hyperlipidemia 02/21/2012  . BENIGN PROSTATIC HYPERTROPHY, WITH OBSTRUCTION 01/15/2010  . Essential hypertension 11/10/2009  . GERD 02/22/2008  . NEPHROLITHIASIS, HX OF 11/17/2007    [name], PTA 11/21/16 11:28 AM  [facility] [place], [place], [address] Phone: [phone]   Fax:  [phone]  Name: [name] MRN: [id number] Date of Birth: Sep 06, 1935

## 2016-11-25 ENCOUNTER — Ambulatory Visit: Payer: Medicare Other | Attending: Orthopaedic Surgery | Admitting: Physical Therapy

## 2016-11-25 ENCOUNTER — Encounter: Payer: Self-pay | Admitting: Physical Therapy

## 2016-11-25 DIAGNOSIS — M542 Cervicalgia: Secondary | ICD-10-CM | POA: Insufficient documentation

## 2016-11-25 DIAGNOSIS — R293 Abnormal posture: Secondary | ICD-10-CM | POA: Diagnosis not present

## 2016-11-25 DIAGNOSIS — M546 Pain in thoracic spine: Secondary | ICD-10-CM | POA: Diagnosis not present

## 2016-11-25 NOTE — Therapy (Signed)
[facility] [place], [place], [address] Phone: [phone]   Fax:  [phone]  Physical Therapy Treatment  Patient Details  Name: [name] MRN: [id number] Date of Birth: 06/09/1936 Referring Provider: Jean Rosenthal MD.  Encounter Date: 11/25/2016      PT End of Session - 11/25/16 1534    Visit Number 11   Number of Visits 16   Date for PT Re-Evaluation 12/20/16   PT Start Time 1528   PT Stop Time 1612   PT Time Calculation (min) 44 min   Activity Tolerance Patient tolerated treatment well   Behavior During Therapy [facility] for tasks assessed/performed      Past Medical History:  Diagnosis Date  . Ankylosing spondylitis ([place]) dx'd ~ 1974  . Arthritis    "back" (08/10/2015)  . BENIGN PROSTATIC HYPERTROPHY, WITH OBSTRUCTION 01/15/2010  . Bleeding duodenal ulcer   . Bleeding esophageal ulcer   . Bleeding stomach ulcer   . GERD 02/22/2008  . History of blood transfusion 1988   "lost ~ 1/2 of my blood volume from multiple bleeding ulcers"  . History of hiatal hernia   . HYPERGLYCEMIA 11/18/2007  . HYPERLIPIDEMIA 02/22/2008  . HYPERTENSION, UNSPECIFIED 11/10/2009  . Kidney stones   . Osteoarthritis    c-spine  . PEPTIC ULCER DISEASE 11/17/2007  . Situational depression    "son died in [place] 07-17-15"  . TIA (transient ischemic attack)    "not that I know of in the past; they are trying to determine if I've had one today (08/10/2015)"    Past Surgical History:  Procedure Laterality Date  . CATARACT EXTRACTION W/ INTRAOCULAR LENS  IMPLANT, BILATERAL Bilateral 2013    There were no vitals filed for this visit.      Subjective Assessment - 11/25/16 1531    Subjective Reports that he was late secondary to having plumbers at his home. Reports only stiffness upon arrival today. Reports seeing some improvement in regards to cervical rotation.   Limitations Sitting   How long can you sit comfortably? 20 minutes.   Patient Stated Goals Reduce pain and sleep better.   Currently in Pain? No/denies            [facility] PT Assessment - 11/25/16 0001      Assessment   Medical Diagnosis Ankylosing Spondylitis.   Next MD Visit PRN     Precautions   Precautions None     Restrictions   Weight Bearing Restrictions No     ROM / Strength   AROM / PROM / Strength AROM     AROM   Overall AROM  Deficits   AROM Assessment Site Cervical   Cervical - Right Rotation 50   Cervical - Left Rotation 50                     [facility] Adult PT Treatment/Exercise - 11/25/16 0001      Neck Exercises: Machines for Strengthening   UBE (Upper Arm Bike) 120 RPM's x 5 minutes.     Neck Exercises: Supine   Cervical Rotation Both;20 reps   Cervical Rotation Limitations 5 sec hold to each side     Lumbar Exercises: Aerobic   Stationary Bike NuStep L7 x16:14 min     Shoulder Exercises: Supine   Other Supine Exercises Supine open book stretch with bolster x3 min, horizontal abduction in open book position yellow theraband x20 reps   Other Supine Exercises B shoulder diagonals yellow  theraband x20 reps each     Shoulder Exercises: Standing   Horizontal ABduction Strengthening;Both;20 reps;Theraband   Horizontal ABduction Limitations Pink XTS   Row Strengthening;Both;20 reps   Row Limitations Pink XTS     Shoulder Exercises: Pulleys   Flexion 3 minutes                  PT Short Term Goals - 10/21/16 1554      PT SHORT TERM GOAL #1   Title STG's=LTG's.           PT Long Term Goals - 11/21/16 1114      PT LONG TERM GOAL #1   Title Bilateral active cervical rotation= 60-65. degrees so he can turn his head more easiliy while driving.   Time 8   Period Weeks   Status On-going     PT LONG TERM GOAL #2   Title Sleep 6 hours undisturbed.   Time 8   Period Weeks   Status Achieved     PT LONG TERM GOAL #3   Title Perform ADL's with pain not > 3/10.   Time 8   Period Weeks    Status Achieved     PT LONG TERM GOAL #4   Title Eliminate headaches.   Period Weeks   Status Achieved               Plan - 11/25/16 1615    Clinical Impression Statement Patient tolerated today's treatment well with only reports of stiffness upon arrival. Patient guided through various postural strengthening exercises with resistance to strengthen into proper erect posture. Open book stretch completed in supine today with bolster to further enhance chest stretch. AROM of B cervical rotation measured at 50 deg each way. Patient interested in dry needling to assess whether it could benefit him.   Rehab Potential Good   PT Frequency 2x / week   PT Duration 8 weeks   PT Treatment/Interventions ADLs/Self Care Home Management;Cryotherapy;Electrical Stimulation;Moist Heat;Ultrasound;Therapeutic activities;Therapeutic exercise;Patient/family education;Passive range of motion;Manual techniques;Dry needling   PT Next Visit Plan Continue postural strengthening and stretching as well as cervical and trunk rotation exercises per MPT POC.   PT Home Exercise Plan supine lying in cross position with chin tucks, open book thoracic stretch with towel roll under spine in supine position   Consulted and Agree with Plan of Care Patient      Patient will benefit from skilled therapeutic intervention in order to improve the following deficits and impairments:  Pain, Decreased activity tolerance, Decreased range of motion, Postural dysfunction, Decreased strength  Visit Diagnosis: Pain in thoracic spine  Cervicalgia  Abnormal posture     Problem List Patient Active Problem List   Diagnosis Date Noted  . Ankylosing spondylitis of cervicothoracic region ([place]) 10/16/2016  . TIA (transient ischemic attack) 08/10/2015  . Carotid stenosis 08/04/2014  . Hyperlipidemia 02/21/2012  . BENIGN PROSTATIC HYPERTROPHY, WITH OBSTRUCTION 01/15/2010  . Essential hypertension 11/10/2009  . GERD 02/22/2008  .  NEPHROLITHIASIS, HX OF 11/17/2007    Wynelle Fanny, PTA 11/25/2016, 4:20 PM  [facility] [address] [place], [place], [address] Phone: [phone]   Fax:  [phone]  Name: [name] MRN: [id number] Date of Birth: 07/05/1936

## 2016-11-28 ENCOUNTER — Encounter: Payer: Self-pay | Admitting: Physical Therapy

## 2016-11-28 ENCOUNTER — Ambulatory Visit: Payer: Medicare Other | Admitting: Physical Therapy

## 2016-11-28 DIAGNOSIS — M546 Pain in thoracic spine: Secondary | ICD-10-CM | POA: Diagnosis not present

## 2016-11-28 DIAGNOSIS — R293 Abnormal posture: Secondary | ICD-10-CM | POA: Diagnosis not present

## 2016-11-28 DIAGNOSIS — M542 Cervicalgia: Secondary | ICD-10-CM

## 2016-11-28 NOTE — Therapy (Signed)
[facility] [place], [place], [address] Phone: [phone]   Fax:  [phone]  Physical Therapy Treatment  Patient Details  Name: [name] MRN: [id number] Date of Birth: 09/25/1935 Referring Provider: Jean Rosenthal MD.  Encounter Date: 11/28/2016      PT End of Session - 11/28/16 0911    Visit Number 12   Number of Visits 16   Date for PT Re-Evaluation 12/20/16   PT Start Time 0906   PT Stop Time 1008   PT Time Calculation (min) 62 min   Activity Tolerance Patient tolerated treatment well   Behavior During Therapy [facility] for tasks assessed/performed      Past Medical History:  Diagnosis Date  . Ankylosing spondylitis ([place]) dx'd ~ 1974  . Arthritis    "back" (08/10/2015)  . BENIGN PROSTATIC HYPERTROPHY, WITH OBSTRUCTION 01/15/2010  . Bleeding duodenal ulcer   . Bleeding esophageal ulcer   . Bleeding stomach ulcer   . GERD 02/22/2008  . History of blood transfusion 1988   "lost ~ 1/2 of my blood volume from multiple bleeding ulcers"  . History of hiatal hernia   . HYPERGLYCEMIA 11/18/2007  . HYPERLIPIDEMIA 02/22/2008  . HYPERTENSION, UNSPECIFIED 11/10/2009  . Kidney stones   . Osteoarthritis    c-spine  . PEPTIC ULCER DISEASE 11/17/2007  . Situational depression    "son died in [place] July 13, 2015"  . TIA (transient ischemic attack)    "not that I know of in the past; they are trying to determine if I've had one today (08/10/2015)"    Past Surgical History:  Procedure Laterality Date  . CATARACT EXTRACTION W/ INTRAOCULAR LENS  IMPLANT, BILATERAL Bilateral 2013    There were no vitals filed for this visit.      Subjective Assessment - 11/28/16 0908    Subjective Reports that he has days where he has no pain and days where he has pain for no known reason.   Limitations Sitting   How long can you sit comfortably? 20 minutes.   Patient Stated Goals Reduce pain and sleep better.   Currently in Pain?  No/denies            [facility] PT Assessment - 11/28/16 0001      Assessment   Medical Diagnosis Ankylosing Spondylitis.   Next MD Visit PRN     Precautions   Precautions None     Restrictions   Weight Bearing Restrictions No                     [facility] Adult PT Treatment/Exercise - 11/28/16 0001      Exercises   Exercises Shoulder;Neck     Neck Exercises: Machines for Strengthening   UBE (Upper Arm Bike) 90 RPM x5 min   Cybex Row 30 x20 reps     Neck Exercises: Standing   Wall Push Ups 20 reps   UE D2 Limitations x20 reps BUE green theraband    Other Standing Exercises Standing Rows, extension with pink XTS x 20 reps holding 3 seconds each   Other Standing Exercises Wall walks green theraband x1 RT     Lumbar Exercises: Aerobic   Stationary Bike NuStep L7 x16:14 min  1000 steps     Shoulder Exercises: Standing   Horizontal ABduction Strengthening;Both;20 reps;Theraband   Theraband Level (Shoulder Horizontal ABduction) Level 3 (Green)   External Rotation Strengthening;Both;20 reps;Theraband   Theraband Level (Shoulder External Rotation) Level 3 (Green)   Flexion Strengthening;Both;20  reps;Weights   Shoulder Flexion Weight (lbs) 4  4# ball with cervical extension     Shoulder Exercises: Pulleys   Flexion Other (comment)  x40 reps     Shoulder Exercises: Stretch   Corner Stretch 3 reps;30 seconds     Modalities   Modalities Social worker Location B UT   Electrical Stimulation Action Pre-mod   Electrical Stimulation Parameters 80-150 hz x15 min   Electrical Stimulation Goals Pain                  PT Short Term Goals - 10/21/16 1554      PT SHORT TERM GOAL #1   Title STG's=LTG's.           PT Long Term Goals - 11/21/16 1114      PT LONG TERM GOAL #1   Title Bilateral active cervical rotation= 60-65. degrees so he can turn his head more easiliy while driving.   Time  8   Period Weeks   Status On-going     PT LONG TERM GOAL #2   Title Sleep 6 hours undisturbed.   Time 8   Period Weeks   Status Achieved     PT LONG TERM GOAL #3   Title Perform ADL's with pain not > 3/10.   Time 8   Period Weeks   Status Achieved     PT LONG TERM GOAL #4   Title Eliminate headaches.   Period Weeks   Status Achieved               Plan - 11/28/16 1018    Clinical Impression Statement Patient tolerated today's treatment well with no reports of any increased pain during treatment. Patient guided through postural and shoulder strengthening exercises with VCs throughout the treatment for erect stance and proper posture. Machine back strengthening initiated today with light weight and tactile cues to avoid lumbar extension for compensation. Electrical stimulation conducted per patient request to B UT with normal response noted following removal of the modality.   Rehab Potential Good   PT Frequency 2x / week   PT Duration 8 weeks   PT Treatment/Interventions ADLs/Self Care Home Management;Cryotherapy;Electrical Stimulation;Moist Heat;Ultrasound;Therapeutic activities;Therapeutic exercise;Patient/family education;Passive range of motion;Manual techniques;Dry needling   PT Next Visit Plan Continue postural strengthening and stretching as well as cervical and trunk rotation exercises per MPT POC.   PT Home Exercise Plan supine lying in cross position with chin tucks, open book thoracic stretch with towel roll under spine in supine position   Consulted and Agree with Plan of Care Patient      Patient will benefit from skilled therapeutic intervention in order to improve the following deficits and impairments:  Pain, Decreased activity tolerance, Decreased range of motion, Postural dysfunction, Decreased strength  Visit Diagnosis: Pain in thoracic spine  Cervicalgia  Abnormal posture     Problem List Patient Active Problem List   Diagnosis Date Noted  .  Ankylosing spondylitis of cervicothoracic region ([place]) 10/16/2016  . TIA (transient ischemic attack) 08/10/2015  . Carotid stenosis 08/04/2014  . Hyperlipidemia 02/21/2012  . BENIGN PROSTATIC HYPERTROPHY, WITH OBSTRUCTION 01/15/2010  . Essential hypertension 11/10/2009  . GERD 02/22/2008  . NEPHROLITHIASIS, HX OF 11/17/2007    Wynelle Fanny, PTA 11/28/2016, 10:26 AM  [facility] [address] [place], [place], [address] Phone: [phone]   Fax:  [phone]  Name: [name] MRN: [id number] Date of  Birth: 08-Jun-1936

## 2016-12-02 ENCOUNTER — Ambulatory Visit: Payer: Medicare Other | Admitting: Physical Therapy

## 2016-12-02 ENCOUNTER — Encounter: Payer: Self-pay | Admitting: Physical Therapy

## 2016-12-02 DIAGNOSIS — R293 Abnormal posture: Secondary | ICD-10-CM | POA: Diagnosis not present

## 2016-12-02 DIAGNOSIS — M542 Cervicalgia: Secondary | ICD-10-CM | POA: Diagnosis not present

## 2016-12-02 DIAGNOSIS — M546 Pain in thoracic spine: Secondary | ICD-10-CM | POA: Diagnosis not present

## 2016-12-02 NOTE — Therapy (Signed)
[facility] [place], [place], [address] Phone: [phone]   Fax:  [phone]  Physical Therapy Treatment  Patient Details  Name: [name] MRN: [id number] Date of Birth: 1936-06-10 Referring Provider: Jean Rosenthal MD.  Encounter Date: 12/02/2016      PT End of Session - 12/02/16 1040    Visit Number 13   Number of Visits 16   Date for PT Re-Evaluation 12/20/16   PT Start Time 1034   PT Stop Time 1129   PT Time Calculation (min) 55 min   Activity Tolerance Patient tolerated treatment well   Behavior During Therapy [facility] for tasks assessed/performed      Past Medical History:  Diagnosis Date  . Ankylosing spondylitis ([place]) dx'd ~ 1974  . Arthritis    "back" (08/10/2015)  . BENIGN PROSTATIC HYPERTROPHY, WITH OBSTRUCTION 01/15/2010  . Bleeding duodenal ulcer   . Bleeding esophageal ulcer   . Bleeding stomach ulcer   . GERD 02/22/2008  . History of blood transfusion 1988   "lost ~ 1/2 of my blood volume from multiple bleeding ulcers"  . History of hiatal hernia   . HYPERGLYCEMIA 11/18/2007  . HYPERLIPIDEMIA 02/22/2008  . HYPERTENSION, UNSPECIFIED 11/10/2009  . Kidney stones   . Osteoarthritis    c-spine  . PEPTIC ULCER DISEASE 11/17/2007  . Situational depression    "son died in [place] 2015/07/24"  . TIA (transient ischemic attack)    "not that I know of in the past; they are trying to determine if I've had one today (08/10/2015)"    Past Surgical History:  Procedure Laterality Date  . CATARACT EXTRACTION W/ INTRAOCULAR LENS  IMPLANT, BILATERAL Bilateral 2013    There were no vitals filed for this visit.      Subjective Assessment - 12/02/16 1038    Subjective Reports that he is has intermittant pain along the L cervical region but not currently.   Limitations Sitting   How long can you sit comfortably? 20 minutes.   Patient Stated Goals Reduce pain and sleep better.   Currently in Pain? No/denies             [facility] PT Assessment - 12/02/16 0001      Assessment   Medical Diagnosis Ankylosing Spondylitis.   Next MD Visit PRN     Precautions   Precautions None     Restrictions   Weight Bearing Restrictions No                     [facility] Adult PT Treatment/Exercise - 12/02/16 0001      Neck Exercises: Machines for Strengthening   UBE (Upper Arm Bike) 90 RPM x5 min   Cybex Row 40# 4x10 reps   Other Machines for Strengthening Lat pulldown 40# x20 reps     Lumbar Exercises: Aerobic   Stationary Bike NuStep L7 x16:14 min     Knee/Hip Exercises: Aerobic   Nustep NuStep L7 x15:43 min for 1000 steps     Shoulder Exercises: Standing   Horizontal ABduction Strengthening;Both;20 reps;Theraband   Theraband Level (Shoulder Horizontal ABduction) Level 2 (Red)   External Rotation Strengthening;Both;20 reps;Theraband   Theraband Level (Shoulder External Rotation) Level 2 (Red)   Row Strengthening;Both;20 reps   Row Limitations Pink XTS; high row     Shoulder Exercises: Pulleys   Flexion 2 minutes     Modalities   Modalities Electrical Stimulation     Electrical Stimulation   Electrical Stimulation Location B  UT   Electrical Stimulation Action Pre-Mod   Electrical Stimulation Parameters 80-150 hz x15 min   Electrical Stimulation Goals Tone;Pain                  PT Short Term Goals - 10/21/16 1554      PT SHORT TERM GOAL #1   Title STG's=LTG's.           PT Long Term Goals - 11/21/16 1114      PT LONG TERM GOAL #1   Title Bilateral active cervical rotation= 60-65. degrees so he can turn his head more easiliy while driving.   Time 8   Period Weeks   Status On-going     PT LONG TERM GOAL #2   Title Sleep 6 hours undisturbed.   Time 8   Period Weeks   Status Achieved     PT LONG TERM GOAL #3   Title Perform ADL's with pain not > 3/10.   Time 8   Period Weeks   Status Achieved     PT LONG TERM GOAL #4   Title Eliminate headaches.    Period Weeks   Status Achieved               Plan - 12/02/16 1115    Clinical Impression Statement Patient tolerated today's treatment well with no reports of increased pain although he reported pain intermittantly in L UT region. Patient requested electrical stimulation secondary to pain and tone.  Patient required VCs and tactile cues to avoid lumbar extension with machine row. Normal electrical stimulation response noted following removal of the modality.    Rehab Potential Good   PT Frequency 2x / week   PT Duration 8 weeks   PT Treatment/Interventions ADLs/Self Care Home Management;Cryotherapy;Electrical Stimulation;Moist Heat;Ultrasound;Therapeutic activities;Therapeutic exercise;Patient/family education;Passive range of motion;Manual techniques;Dry needling   PT Next Visit Plan Continue postural strengthening and stretching as well as cervical and trunk rotation exercises per MPT POC.   PT Home Exercise Plan supine lying in cross position with chin tucks, open book thoracic stretch with towel roll under spine in supine position   Consulted and Agree with Plan of Care Patient      Patient will benefit from skilled therapeutic intervention in order to improve the following deficits and impairments:  Pain, Decreased activity tolerance, Decreased range of motion, Postural dysfunction, Decreased strength  Visit Diagnosis: Pain in thoracic spine  Cervicalgia  Abnormal posture     Problem List Patient Active Problem List   Diagnosis Date Noted  . Ankylosing spondylitis of cervicothoracic region ([place]) 10/16/2016  . TIA (transient ischemic attack) 08/10/2015  . Carotid stenosis 08/04/2014  . Hyperlipidemia 02/21/2012  . BENIGN PROSTATIC HYPERTROPHY, WITH OBSTRUCTION 01/15/2010  . Essential hypertension 11/10/2009  . GERD 02/22/2008  . NEPHROLITHIASIS, HX OF 11/17/2007    Wynelle Fanny, PTA 12/02/2016, 11:35 AM  [facility] [address] [place], [place], [address] Phone: [phone]   Fax:  [phone]  Name: NICHOLIS STEPANEK MRN: [id number] Date of Birth: [birth year]

## 2016-12-03 ENCOUNTER — Ambulatory Visit (HOSPITAL_COMMUNITY)
Admission: RE | Admit: 2016-12-03 | Discharge: 2016-12-03 | Disposition: A | Discharge status: Home / Self Care | Payer: Medicare Other | Source: Ambulatory Visit | Attending: Vascular Surgery | Admitting: Vascular Surgery

## 2016-12-03 DIAGNOSIS — I6523 Occlusion and stenosis of bilateral carotid arteries: Secondary | ICD-10-CM | POA: Diagnosis not present

## 2016-12-05 ENCOUNTER — Encounter: Payer: Self-pay | Admitting: Physical Therapy

## 2016-12-05 ENCOUNTER — Ambulatory Visit: Payer: Medicare Other | Admitting: Physical Therapy

## 2016-12-05 DIAGNOSIS — M542 Cervicalgia: Secondary | ICD-10-CM | POA: Diagnosis not present

## 2016-12-05 DIAGNOSIS — R293 Abnormal posture: Secondary | ICD-10-CM | POA: Diagnosis not present

## 2016-12-05 DIAGNOSIS — M546 Pain in thoracic spine: Secondary | ICD-10-CM

## 2016-12-05 NOTE — Therapy (Signed)
[facility] [place], [place], [address] Phone: [phone]   Fax:  [phone]  Physical Therapy Treatment  Patient Details  Name: [name] MRN: [id number] Date of Birth: 05-Oct-1935 Referring Provider: Jean Rosenthal MD.  Encounter Date: 12/05/2016      PT End of Session - 12/05/16 1054    Visit Number 14   Number of Visits 16   Date for PT Re-Evaluation 12/20/16   PT Start Time 1036   PT Stop Time 1130   PT Time Calculation (min) 54 min   Activity Tolerance Patient tolerated treatment well   Behavior During Therapy [facility] for tasks assessed/performed      Past Medical History:  Diagnosis Date  . Ankylosing spondylitis ([place]) dx'd ~ 1974  . Arthritis    "back" (08/10/2015)  . BENIGN PROSTATIC HYPERTROPHY, WITH OBSTRUCTION 01/15/2010  . Bleeding duodenal ulcer   . Bleeding esophageal ulcer   . Bleeding stomach ulcer   . GERD 02/22/2008  . History of blood transfusion 1988   "lost ~ 1/2 of my blood volume from multiple bleeding ulcers"  . History of hiatal hernia   . HYPERGLYCEMIA 11/18/2007  . HYPERLIPIDEMIA 02/22/2008  . HYPERTENSION, UNSPECIFIED 11/10/2009  . Kidney stones   . Osteoarthritis    c-spine  . PEPTIC ULCER DISEASE 11/17/2007  . Situational depression    "son died in [place] 07/20/2015"  . TIA (transient ischemic attack)    "not that I know of in the past; they are trying to determine if I've had one today (08/10/2015)"    Past Surgical History:  Procedure Laterality Date  . CATARACT EXTRACTION W/ INTRAOCULAR LENS  IMPLANT, BILATERAL Bilateral 2013    There were no vitals filed for this visit.      Subjective Assessment - 12/05/16 1042    Subjective Patient reported ongoing progress overall   Limitations Sitting   How long can you sit comfortably? 20 minutes.   Patient Stated Goals Reduce pain and sleep better.   Currently in Pain? No/denies            [facility] PT Assessment - 12/05/16  0001      AROM   Overall AROM  Deficits   AROM Assessment Site Cervical   Cervical - Right Rotation 45   Cervical - Left Rotation 55                     [facility] Adult PT Treatment/Exercise - 12/05/16 0001      Neck Exercises: Machines for Strengthening   UBE (Upper Arm Bike) 90 RPM x6 min   Other Machines for Strengthening Lat pulldown 40# x20 reps   Other Machines for Strengthening rows 30#x20     Lumbar Exercises: Aerobic   Stationary Bike NuStep L7 x16.66min 1000 steps goal     Shoulder Exercises: Standing   Row Strengthening;Both;20 reps   Row Limitations Pink XTS; high row     Shoulder Exercises: Pulleys   Flexion 3 minutes     Moist Heat Therapy   Number Minutes Moist Heat 15 Minutes   Moist Heat Location Cervical     Electrical Stimulation   Electrical Stimulation Location B UT   Electrical Stimulation Action premod   Electrical Stimulation Parameters 80-150hz  x25min   Electrical Stimulation Goals Tone;Pain                  PT Short Term Goals - 10/21/16 1554      PT SHORT  TERM GOAL #1   Title STG's=LTG's.           PT Long Term Goals - 11/21/16 1114      PT LONG TERM GOAL #1   Title Bilateral active cervical rotation= 60-65. degrees so he can turn his head more easiliy while driving.   Time 8   Period Weeks   Status On-going     PT LONG TERM GOAL #2   Title Sleep 6 hours undisturbed.   Time 8   Period Weeks   Status Achieved     PT LONG TERM GOAL #3   Title Perform ADL's with pain not > 3/10.   Time 8   Period Weeks   Status Achieved     PT LONG TERM GOAL #4   Title Eliminate headaches.   Period Weeks   Status Achieved               Plan - 12/05/16 1055    Clinical Impression Statement Patient tolerated treatment well today. Patient had no reported increased pain during exercises. Patient has somereported discomfort on occasion in left UT area with certain movements. Patient requested modalities after  exercises today. Patient progressing toward goals yet ongoing due to cercvical ROM deficts.    Rehab Potential Good   PT Frequency 2x / week   PT Duration 8 weeks   PT Treatment/Interventions ADLs/Self Care Home Management;Cryotherapy;Electrical Stimulation;Moist Heat;Ultrasound;Therapeutic activities;Therapeutic exercise;Patient/family education;Passive range of motion;Manual techniques;Dry needling   PT Next Visit Plan Continue postural strengthening and stretching as well as cervical and trunk rotation exercises per MPT POC for 2 visits   Consulted and Agree with Plan of Care Patient      Patient will benefit from skilled therapeutic intervention in order to improve the following deficits and impairments:  Pain, Decreased activity tolerance, Decreased range of motion, Postural dysfunction, Decreased strength  Visit Diagnosis: Pain in thoracic spine  Cervicalgia  Abnormal posture     Problem List Patient Active Problem List   Diagnosis Date Noted  . Ankylosing spondylitis of cervicothoracic region ([place]) 10/16/2016  . TIA (transient ischemic attack) 08/10/2015  . Carotid stenosis 08/04/2014  . Hyperlipidemia 02/21/2012  . BENIGN PROSTATIC HYPERTROPHY, WITH OBSTRUCTION 01/15/2010  . Essential hypertension 11/10/2009  . GERD 02/22/2008  . NEPHROLITHIASIS, HX OF 11/17/2007    Phillips Climes, PTA 12/05/2016, 11:38 AM  [facility] [place], [place], [address] Phone: [phone]   Fax:  [phone]  Name: [name] MRN: [id number] Date of Birth: Jan 21, 1936

## 2016-12-16 ENCOUNTER — Ambulatory Visit: Payer: Medicare Other | Admitting: Physical Therapy

## 2016-12-16 ENCOUNTER — Encounter: Payer: Self-pay | Admitting: Physical Therapy

## 2016-12-16 DIAGNOSIS — M542 Cervicalgia: Secondary | ICD-10-CM

## 2016-12-16 DIAGNOSIS — M546 Pain in thoracic spine: Secondary | ICD-10-CM

## 2016-12-16 DIAGNOSIS — R293 Abnormal posture: Secondary | ICD-10-CM | POA: Diagnosis not present

## 2016-12-16 NOTE — Therapy (Addendum)
[facility] [place], [place], [address] Phone: [phone]   Fax:  [phone]  Physical Therapy Treatment  Patient Details  Name: [name] MRN: [id number] Date of Birth: 18-Oct-1935 Referring Provider: Jean Rosenthal MD.  Encounter Date: 12/16/2016      PT End of Session - 12/16/16 1114    Visit Number 15   Number of Visits 16   Date for PT Re-Evaluation 12/20/16   PT Start Time 4801   PT Stop Time 1120   PT Time Calculation (min) 44 min   Activity Tolerance Patient tolerated treatment well   Behavior During Therapy [facility] for tasks assessed/performed      Past Medical History:  Diagnosis Date  . Ankylosing spondylitis ([place]) dx'd ~ 1974  . Arthritis    "back" (08/10/2015)  . BENIGN PROSTATIC HYPERTROPHY, WITH OBSTRUCTION 01/15/2010  . Bleeding duodenal ulcer   . Bleeding esophageal ulcer   . Bleeding stomach ulcer   . GERD 02/22/2008  . History of blood transfusion 1988   "lost ~ 1/2 of my blood volume from multiple bleeding ulcers"  . History of hiatal hernia   . HYPERGLYCEMIA 11/18/2007  . HYPERLIPIDEMIA 02/22/2008  . HYPERTENSION, UNSPECIFIED 11/10/2009  . Kidney stones   . Osteoarthritis    c-spine  . PEPTIC ULCER DISEASE 11/17/2007  . Situational depression    "son died in [place] 2015/06/07"  . TIA (transient ischemic attack)    "not that I know of in the past; they are trying to determine if I've had one today (08/10/2015)"    Past Surgical History:  Procedure Laterality Date  . CATARACT EXTRACTION W/ INTRAOCULAR LENS  IMPLANT, BILATERAL Bilateral 2013    There were no vitals filed for this visit.      Subjective Assessment - 12/16/16 1042    Subjective Patient arrived with pain in right UE after working on RV and air compressor slipped and fell hurting right UE   Limitations Sitting   How long can you sit comfortably? 20 minutes.   Patient Stated Goals Reduce pain and sleep better.   Currently in Pain? Yes   Pain Score 1    Pain Location Neck  right shoulder bicep area   Pain Orientation Right;Left   Pain Descriptors / Indicators Aching;Sore   Pain Type Chronic pain   Pain Onset More than a month ago   Pain Frequency Constant   Aggravating Factors  prolong activity   Pain Relieving Factors rest                         [facility] Adult PT Treatment/Exercise - 12/16/16 0001      Neck Exercises: Machines for Strengthening   UBE (Upper Arm Bike) 120 RPM x6 min (only one direction to avoid soreness in shoulder)     Lumbar Exercises: Aerobic   Stationary Bike NuStep L7 x16 min 1000 steps goal     Shoulder Exercises: Standing   Row Strengthening;Both;20 reps     Shoulder Exercises: Pulleys   Flexion 3 minutes     Moist Heat Therapy   Number Minutes Moist Heat 15 Minutes   Moist Heat Location Cervical     Electrical Stimulation   Electrical Stimulation Location B UT   Electrical Stimulation Action premod   Electrical Stimulation Parameters 80-_0  x72mn   Electrical Stimulation Goals Tone;Pain                  PT Short  Term Goals - 10/21/16 1554      PT SHORT TERM GOAL #1   Title STG's=LTG's.           PT Long Term Goals - 11/21/16 1114      PT LONG TERM GOAL #1   Title Bilateral active cervical rotation= 60-65. degrees so he can turn his head more easiliy while driving.   Time 8   Period Weeks   Status On-going     PT LONG TERM GOAL #2   Title Sleep 6 hours undisturbed.   Time 8   Period Weeks   Status Achieved     PT LONG TERM GOAL #3   Title Perform ADL's with pain not > 3/10.   Time 8   Period Weeks   Status Achieved     PT LONG TERM GOAL #4   Title Eliminate headaches.   Period Weeks   Status Achieved               Plan - 12/16/16 1115    Clinical Impression Statement Patient tolerated treatment well today with some soreness in right UE due to recent injury. Today focused on lower level  exercises to avoid pain in right UE. Monitored patient throughout to avoid pain. Patient remaining goals ongoing due to cervical ROM deficts.   Rehab Potential Good   PT Frequency 2x / week   PT Duration 8 weeks   PT Treatment/Interventions ADLs/Self Care Home Management;Cryotherapy;Electrical Stimulation;Moist Heat;Ultrasound;Therapeutic activities;Therapeutic exercise;Patient/family education;Passive range of motion;Manual techniques;Dry needling   PT Next Visit Plan DC after next visit   Consulted and Agree with Plan of Care Patient      Patient will benefit from skilled therapeutic intervention in order to improve the following deficits and impairments:  Pain, Decreased activity tolerance, Decreased range of motion, Postural dysfunction, Decreased strength  Visit Diagnosis: Pain in thoracic spine  Cervicalgia  Abnormal posture     Problem List Patient Active Problem List   Diagnosis Date Noted  . Ankylosing spondylitis of cervicothoracic region ([place]) 10/16/2016  . TIA (transient ischemic attack) 08/10/2015  . Carotid stenosis 08/04/2014  . Hyperlipidemia 02/21/2012  . BENIGN PROSTATIC HYPERTROPHY, WITH OBSTRUCTION 01/15/2010  . Essential hypertension 11/10/2009  . GERD 02/22/2008  . NEPHROLITHIASIS, HX OF 11/17/2007    Phillips Climes, PTA 12/16/2016, 11:26 AM  [facility] [place], [place], [address] Phone: [phone]   Fax:  [phone]  Name: [name] MRN: [id number] Date of Birth: 1935/12/09  PHYSICAL THERAPY DISCHARGE SUMMARY  Visits from Start of Care: 15.  Current functional level related to goals / functional outcomes: See above.   Remaining deficits: Goal #1 met.   Education / Equipment: HEP. Plan: Patient agrees to discharge.  Patient goals were partially met. Patient is being discharged due to being pleased with the current functional level.  ?????         [name] Applegate  MPT

## 2016-12-19 DIAGNOSIS — H26492 Other secondary cataract, left eye: Secondary | ICD-10-CM | POA: Diagnosis not present

## 2016-12-20 ENCOUNTER — Encounter: Payer: Medicare Other | Admitting: Physical Therapy

## 2016-12-27 ENCOUNTER — Encounter: Payer: Self-pay | Admitting: Podiatry

## 2016-12-27 ENCOUNTER — Ambulatory Visit (INDEPENDENT_AMBULATORY_CARE_PROVIDER_SITE_OTHER): Payer: Medicare Other | Admitting: Podiatry

## 2016-12-27 DIAGNOSIS — B351 Tinea unguium: Secondary | ICD-10-CM

## 2016-12-27 DIAGNOSIS — M79676 Pain in unspecified toe(s): Secondary | ICD-10-CM | POA: Diagnosis not present

## 2016-12-27 NOTE — Progress Notes (Signed)
Patient ID: [name], male   DOB: [birth year], 81 y.o.   MRN: [id number] Complaint:  Visit Type: Patient returns to my office for continued preventative foot care services. Complaint: Patient states" my nails have grown long and thick and become painful to walk and wear shoes" . The patient presents for preventative foot care services. No changes to ROS  Podiatric Exam: Vascular: dorsalis pedis and posterior tibial pulses are palpable bilateral. Capillary return is immediate. Temperature gradient is WNL. Skin turgor WNL  Sensorium: Normal Semmes Weinstein monofilament test. Normal tactile sensation bilaterally. Nail Exam: Pt has thick disfigured discolored nails with subungual debris noted bilateral entire nail hallux through fifth toenails Ulcer Exam: There is no evidence of ulcer or pre-ulcerative changes or infection. Orthopedic Exam: Muscle tone and strength are WNL. No limitations in general ROM. No crepitus or effusions noted. Foot type and digits show no abnormalities. Bony prominences are unremarkable. Skin: No Porokeratosis. No infection or ulcers  Diagnosis:  Onychomycosis, , Pain in right toe, pain in left toes  Treatment & Plan Procedures and Treatment: Consent by patient was obtained for treatment procedures. The patient understood the discussion of treatment and procedures well. All questions were answered thoroughly reviewed. Debridement of mycotic and hypertrophic toenails, 1 through 5 bilateral and clearing of subungual debris. No ulceration, no infection noted.                     Return Visit-Office Procedure: Patient instructed to return to the office for a follow up visit 3 months for continued evaluation and treatment.    Gardiner Barefoot DPM

## 2017-01-02 ENCOUNTER — Other Ambulatory Visit (INDEPENDENT_AMBULATORY_CARE_PROVIDER_SITE_OTHER): Payer: Self-pay | Admitting: Orthopaedic Surgery

## 2017-01-02 ENCOUNTER — Other Ambulatory Visit: Payer: Self-pay | Admitting: Internal Medicine

## 2017-01-06 ENCOUNTER — Telehealth: Payer: Self-pay | Admitting: Internal Medicine

## 2017-01-06 NOTE — Telephone Encounter (Signed)
° °  Pt call to say he had the following test done CAROTID back in April  and never received the results. He would like a call back     [phone]

## 2017-01-07 NOTE — Telephone Encounter (Signed)
Called and left messages on answering machine

## 2017-01-07 NOTE — Telephone Encounter (Signed)
I see where procedure has been done, but I do not see any results. Would you mind reviewing this please?

## 2017-04-02 ENCOUNTER — Encounter: Payer: Self-pay | Admitting: Podiatry

## 2017-04-02 ENCOUNTER — Ambulatory Visit (INDEPENDENT_AMBULATORY_CARE_PROVIDER_SITE_OTHER): Payer: Medicare Other | Admitting: Podiatry

## 2017-04-02 DIAGNOSIS — B351 Tinea unguium: Secondary | ICD-10-CM

## 2017-04-02 DIAGNOSIS — M79676 Pain in unspecified toe(s): Secondary | ICD-10-CM

## 2017-04-02 NOTE — Progress Notes (Signed)
Patient ID: [name], male   DOB: 11/02/1935, 81 y.o.   MRN: [id number] Complaint:  Visit Type: Patient returns to my office for continued preventative foot care services. Complaint: Patient states" my nails have grown long and thick and become painful to walk and wear shoes" . The patient presents for preventative foot care services. No changes to ROS.  Patient is unable to self treat due to ankylosing spondylitis.  Podiatric Exam: Vascular: dorsalis pedis and posterior tibial pulses are palpable bilateral. Capillary return is immediate. Temperature gradient is WNL. Skin turgor WNL  Sensorium: Normal Semmes Weinstein monofilament test. Normal tactile sensation bilaterally. Nail Exam: Pt has thick disfigured discolored nails with subungual debris noted bilateral entire nail hallux through fifth toenails Ulcer Exam: There is no evidence of ulcer or pre-ulcerative changes or infection. Orthopedic Exam: Muscle tone and strength are WNL. No limitations in general ROM. No crepitus or effusions noted. Foot type and digits show no abnormalities. Bony prominences are unremarkable. Skin: No Porokeratosis. No infection or ulcers  Diagnosis:  Onychomycosis, , Pain in right toe, pain in left toes  Treatment & Plan Procedures and Treatment: Consent by patient was obtained for treatment procedures. The patient understood the discussion of treatment and procedures well. All questions were answered thoroughly reviewed. Debridement of mycotic and hypertrophic toenails, 1 through 5 bilateral and clearing of subungual debris. No ulceration, no infection noted.                     Return Visit-Office Procedure: Patient instructed to return to the office for a follow up visit 3 months for continued evaluation and treatment.    Keano Guggenheim DPM 

## 2017-05-15 ENCOUNTER — Encounter: Payer: Self-pay | Admitting: Internal Medicine

## 2017-05-27 ENCOUNTER — Telehealth (INDEPENDENT_AMBULATORY_CARE_PROVIDER_SITE_OTHER): Payer: Self-pay | Admitting: Orthopaedic Surgery

## 2017-05-27 ENCOUNTER — Other Ambulatory Visit (INDEPENDENT_AMBULATORY_CARE_PROVIDER_SITE_OTHER): Payer: Self-pay

## 2017-05-27 MED ORDER — MELOXICAM 7.5 MG PO TABS: 7.5000 mg | ORAL_TABLET | Freq: Two times a day (BID) | ORAL | 3 refills | Status: DC

## 2017-05-27 NOTE — Telephone Encounter (Signed)
Please advise 

## 2017-05-27 NOTE — Telephone Encounter (Signed)
He has been on meloxicam in the past so we should continue that 7.5 mg twice daily as needed. #60 with 6 refills.

## 2017-05-27 NOTE — Telephone Encounter (Signed)
Called into pharmacy

## 2017-05-27 NOTE — Telephone Encounter (Signed)
Pt needs pain med refill

## 2017-05-29 DIAGNOSIS — Z6823 Body mass index (BMI) 23.0-23.9, adult: Secondary | ICD-10-CM | POA: Diagnosis not present

## 2017-05-29 DIAGNOSIS — M436 Torticollis: Secondary | ICD-10-CM | POA: Diagnosis not present

## 2017-05-29 DIAGNOSIS — R5383 Other fatigue: Secondary | ICD-10-CM | POA: Diagnosis not present

## 2017-05-29 DIAGNOSIS — M459 Ankylosing spondylitis of unspecified sites in spine: Secondary | ICD-10-CM | POA: Diagnosis not present

## 2017-05-29 DIAGNOSIS — G5601 Carpal tunnel syndrome, right upper limb: Secondary | ICD-10-CM | POA: Diagnosis not present

## 2017-05-29 DIAGNOSIS — M150 Primary generalized (osteo)arthritis: Secondary | ICD-10-CM | POA: Diagnosis not present

## 2017-05-29 DIAGNOSIS — M545 Low back pain: Secondary | ICD-10-CM | POA: Diagnosis not present

## 2017-06-04 ENCOUNTER — Ambulatory Visit (INDEPENDENT_AMBULATORY_CARE_PROVIDER_SITE_OTHER): Payer: Medicare Other | Admitting: Orthopaedic Surgery

## 2017-06-04 DIAGNOSIS — I6523 Occlusion and stenosis of bilateral carotid arteries: Secondary | ICD-10-CM

## 2017-06-04 DIAGNOSIS — M453 Ankylosing spondylitis of cervicothoracic region: Secondary | ICD-10-CM

## 2017-06-04 DIAGNOSIS — Z23 Encounter for immunization: Secondary | ICD-10-CM | POA: Diagnosis not present

## 2017-06-04 MED ORDER — MELOXICAM 15 MG PO TABS: 15.0000 mg | ORAL_TABLET | Freq: Every day | ORAL | 0 refills | Status: DC

## 2017-06-04 NOTE — Progress Notes (Signed)
Mr. Tober is generally well known to me. He is now 81 years old and has severe ankylosing spondylitis. His rheumatologist is Dr. Lenna Gilford. She is again recommended physical therapy for him. He went to physical therapy last year and this is mainly for his neck and back. Both of [place] feel that he would benefit from guided physical therapy to work on upper and lower extremity strengthening as well as core strengthening. He is on meloxicam 7.5 mg twice a day and probably change that to 15 mg once a day. He has had no other changes noted status of the he's having a harder time getting up from a sitting position and is feeling weak overall. He does feel like his get a tremor in his left hand. He says this only occurs that when he is carrying an object.  On examination he has limited motion of the cervical and thoracic spines. He has good mobility of the shoulders and legs but certainly just weakness in general and his core strength is significantly weak. I can mobilize his joints easily. He has no tremor in his left hand today. He's got good grip and pinch strength bilaterally in his upper extremities and hands.  [facility] to focus on helping him with his activities daily living with helping him be able to get up from a sitting position easier. This will take strengthening of the upper and lower extremities as well as core strengthening. I'll see him back myself in about 6 weeks see how is doing overall. If any of his pain is worsening in his neck I will like to have an AP and lateral of the cervical spine.

## 2017-06-05 ENCOUNTER — Other Ambulatory Visit: Payer: Self-pay | Admitting: Internal Medicine

## 2017-06-16 ENCOUNTER — Ambulatory Visit: Payer: Medicare Other | Attending: Rheumatology | Admitting: Physical Therapy

## 2017-06-16 DIAGNOSIS — R293 Abnormal posture: Secondary | ICD-10-CM | POA: Insufficient documentation

## 2017-06-16 DIAGNOSIS — M546 Pain in thoracic spine: Secondary | ICD-10-CM | POA: Diagnosis not present

## 2017-06-16 DIAGNOSIS — M542 Cervicalgia: Secondary | ICD-10-CM | POA: Diagnosis not present

## 2017-06-16 NOTE — Therapy (Signed)
[facility] [place], [place], [address] Phone: [phone]   Fax:  [phone]  Physical Therapy Evaluation  Patient Details  Name: [name] MRN: [id number] Date of Birth: 1936/04/10 Referring Provider: Gavin Pound MD.  Encounter Date: 06/16/2017      PT End of Session - 06/16/17 1157    Visit Number 1   Number of Visits 16   Date for PT Re-Evaluation 07/28/17   PT Start Time 0955   PT Stop Time 1026   PT Time Calculation (min) 31 min   Activity Tolerance Patient tolerated treatment well   Behavior During Therapy [facility] for tasks assessed/performed      Past Medical History:  Diagnosis Date  . Ankylosing spondylitis ([place]) dx'd ~ 1974  . Arthritis    "back" (08/10/2015)  . BENIGN PROSTATIC HYPERTROPHY, WITH OBSTRUCTION 01/15/2010  . Bleeding duodenal ulcer   . Bleeding esophageal ulcer   . Bleeding stomach ulcer   . GERD 02/22/2008  . History of blood transfusion 1988   "lost ~ 1/2 of my blood volume from multiple bleeding ulcers"  . History of hiatal hernia   . HYPERGLYCEMIA 11/18/2007  . HYPERLIPIDEMIA 02/22/2008  . HYPERTENSION, UNSPECIFIED 11/10/2009  . Kidney stones   . Osteoarthritis    c-spine  . PEPTIC ULCER DISEASE 11/17/2007  . Situational depression    "son died in [place] 07-04-2015"  . TIA (transient ischemic attack)    "not that I know of in the past; they are trying to determine if I've had one today (08/10/2015)"    Past Surgical History:  Procedure Laterality Date  . CATARACT EXTRACTION W/ INTRAOCULAR LENS  IMPLANT, BILATERAL Bilateral 2013    There were no vitals filed for this visit.       Subjective Assessment - 06/16/17 1158    Subjective The patient presents to OPPT with c/o a decrease in mobility.  He wants to move better and get in and out of chairs easier.  he is reporting very little spinal pain today.   Pertinent History OA; TIA.   Limitations Sitting   How long can you sit  comfortably? 20 minutes.   Patient Stated Goals Move better.   Currently in Pain? Yes   Pain Score 1    Pain Location Back   Pain Orientation Right;Left;Mid   Pain Descriptors / Indicators Aching;Dull;Discomfort   Pain Type Chronic pain   Pain Onset More than a month ago   Pain Frequency Intermittent   Aggravating Factors  Prolonged activity.   Pain Relieving Factors rest.            [facility] PT Assessment - 06/16/17 0001      Assessment   Medical Diagnosis Ankylosing Spondylilis   Referring Provider Gavin Pound MD.   Onset Date/Surgical Date --  Ongoing.     Precautions   Precautions None     Restrictions   Weight Bearing Restrictions No     Balance Screen   Has the patient fallen in the past 6 months No   Has the patient had a decrease in activity level because of a fear of falling?  No   Is the patient reluctant to leave their home because of a fear of falling?  No     Home Environment   Living Environment Private residence     Prior Function   Level of Independence Independent     Posture/Postural Control   Postural Limitations Rounded Shoulders;Forward head;Decreased lumbar lordosis;Increased thoracic  kyphosis;Flexed trunk   Posture Comments Forward head 5 cms of rounded shoulders.     ROM / Strength   AROM / PROM / Strength AROM;Strength     AROM   Overall AROM Comments Left shoulder elevation= 100 degrees; right= 107 degrees and bilateral ER= 45 degrees.  10 degrees of lumbar extension and flexion occuraing at hip to 50%.  Active cervical extension= 17 degrees.   Cervical - Right Rotation 45   Cervical - Left Rotation 35     Strength   Overall Strength Comments Bilateral hip and knee strength= 4 to 4+/5; mid-traps and rhomboids= 4+/5; shoulder strength bilaterally= 4/5.     Palpation   Palpation comment No c/o palpable pain today.     Special Tests    Special Tests --  (-) Romberg test.     Ambulation/Gait   Gait Comments Slow and purposeful  with mild shuffle and right LE remarkable for increased toe out.  The patient walks in trunk flexion.            Objective measurements completed on examination: See above findings.                    PT Short Term Goals - 10/21/16 1554      PT SHORT TERM GOAL #1   Title STG's=LTG's.           PT Long Term Goals - 11/21/16 1114      PT LONG TERM GOAL #1   Title Bilateral active cervical rotation= 60-65. degrees so he can turn his head more easiliy while driving.   Time 8   Period Weeks   Status On-going     PT LONG TERM GOAL #2   Title Sleep 6 hours undisturbed.   Time 8   Period Weeks   Status Achieved     PT LONG TERM GOAL #3   Title Perform ADL's with pain not > 3/10.   Time 8   Period Weeks   Status Achieved     PT LONG TERM GOAL #4   Title Eliminate headaches.   Period Weeks   Status Achieved                Plan - 06/16/17 1216    Clinical Impression Statement The patient presents to OPPT with with a loss of strength and range of motion in spine and bilateral shoulders.  His deficits impair his functional mobility. He has multiple postural abnormalites and gait deviations.  Patient will benefit from skilled physcial therapy.    History and Personal Factors relevant to plan of care: OA; TIA.   Clinical Presentation Evolving   Clinical Decision Making Moderate   Rehab Potential Good   PT Frequency 2x / week   PT Duration 8 weeks   PT Treatment/Interventions ADLs/Self Care Home Management;Cryotherapy;Electrical Stimulation;Moist Heat;Ultrasound;Therapeutic activities;Therapeutic exercise;Patient/family education;Passive range of motion;Manual techniques;Dry needling   PT Next Visit Plan Comprehensive strengthening to include core exercises; balance and gait activities.     Consulted and Agree with Plan of Care Patient      Patient will benefit from skilled therapeutic intervention in order to improve the following deficits and  impairments:  Pain, Decreased activity tolerance, Decreased range of motion, Postural dysfunction, Decreased strength  Visit Diagnosis: Pain in thoracic spine - Plan: PT plan of care cert/re-cert  Cervicalgia - Plan: PT plan of care cert/re-cert  Abnormal posture - Plan: PT plan of care cert/re-cert      G-Codes -  06/16/17 1223    Functional Assessment Tool Used (Outpatient Only) FOTO.Marland [email or web address]% limitation.   Functional Limitation Other PT primary   Other PT Primary Current Status (M7867) At least 20 percent but less than 40 percent impaired, limited or restricted   Other PT Primary Goal Status (J4492) At least 20 percent but less than 40 percent impaired, limited or restricted       Problem List Patient Active Problem List   Diagnosis Date Noted  . Ankylosing spondylitis of cervicothoracic region ([place]) 10/16/2016  . TIA (transient ischemic attack) 08/10/2015  . Carotid stenosis 08/04/2014  . Hyperlipidemia 02/21/2012  . BENIGN PROSTATIC HYPERTROPHY, WITH OBSTRUCTION 01/15/2010  . Essential hypertension 11/10/2009  . GERD 02/22/2008  . NEPHROLITHIASIS, HX OF 11/17/2007    APPLEGATE, [name] MPT 06/16/2017, 12:25 PM  [facility] [address] [place], [place], [address] Phone: [phone]   Fax:  [phone]  Name: [name] MRN: [id number] Date of Birth: 1936-01-27

## 2017-06-19 ENCOUNTER — Encounter: Payer: Self-pay | Admitting: Physical Therapy

## 2017-06-19 ENCOUNTER — Ambulatory Visit: Payer: Medicare Other | Admitting: Physical Therapy

## 2017-06-19 DIAGNOSIS — R293 Abnormal posture: Secondary | ICD-10-CM | POA: Diagnosis not present

## 2017-06-19 DIAGNOSIS — M542 Cervicalgia: Secondary | ICD-10-CM

## 2017-06-19 DIAGNOSIS — M546 Pain in thoracic spine: Secondary | ICD-10-CM | POA: Diagnosis not present

## 2017-06-19 NOTE — Therapy (Signed)
[facility] [place], [place], [address] Phone: [phone]   Fax:  [phone]  Physical Therapy Treatment  Patient Details  Name: [name] MRN: [id number] Date of Birth: 01-26-1936 Referring Provider: Gavin Pound MD.  Encounter Date: 06/19/2017      PT End of Session - 06/19/17 1017    Visit Number 2   Number of Visits 16   Date for PT Re-Evaluation 07/28/17   PT Start Time 0949   PT Stop Time 1030   PT Time Calculation (min) 41 min   Activity Tolerance Patient tolerated treatment well   Behavior During Therapy [facility] for tasks assessed/performed      Past Medical History:  Diagnosis Date  . Ankylosing spondylitis ([place]) dx'd ~ 1974  . Arthritis    "back" (08/10/2015)  . BENIGN PROSTATIC HYPERTROPHY, WITH OBSTRUCTION 01/15/2010  . Bleeding duodenal ulcer   . Bleeding esophageal ulcer   . Bleeding stomach ulcer   . GERD 02/22/2008  . History of blood transfusion 1988   "lost ~ 1/2 of my blood volume from multiple bleeding ulcers"  . History of hiatal hernia   . HYPERGLYCEMIA 11/18/2007  . HYPERLIPIDEMIA 02/22/2008  . HYPERTENSION, UNSPECIFIED 11/10/2009  . Kidney stones   . Osteoarthritis    c-spine  . PEPTIC ULCER DISEASE 11/17/2007  . Situational depression    "son died in [place] 07-01-2015"  . TIA (transient ischemic attack)    "not that I know of in the past; they are trying to determine if I've had one today (08/10/2015)"    Past Surgical History:  Procedure Laterality Date  . CATARACT EXTRACTION W/ INTRAOCULAR LENS  IMPLANT, BILATERAL Bilateral 2013    There were no vitals filed for this visit.      Subjective Assessment - 06/19/17 0957    Subjective Patient arrived with little discomfort and would like to get stronger   Pertinent History OA; TIA.   Limitations Sitting   How long can you sit comfortably? 20 minutes.   Patient Stated Goals Move better.   Currently in Pain? Yes   Pain Score 1    Pain Location Back   Pain Orientation Right;Left;Mid   Pain Descriptors / Indicators Discomfort   Pain Type Chronic pain   Pain Onset More than a month ago   Pain Frequency Intermittent   Aggravating Factors  prolong activity   Pain Relieving Factors rest                         [facility] Adult PT Treatment/Exercise - 06/19/17 0001      Lumbar Exercises: Aerobic   Stationary Bike nustep L5 x18min, UE/LE activity with posture focus     Lumbar Exercises: Standing   Other Standing Lumbar Exercises lat pull with pink XTS x20   Other Standing Lumbar Exercises hip flexor stretch bil LE x2 each side     Lumbar Exercises: Supine   Bridge 20 reps;3 seconds     Knee/Hip Exercises: Machines for Strengthening   Cybex Knee Extension 10# 3x10   Cybex Knee Flexion 20# 3x10     Knee/Hip Exercises: Standing   Rocker Board 3 minutes   Other Standing Knee Exercises balance on airex x51min     Shoulder Exercises: Pulleys   Flexion 3 minutes                  PT Short Term Goals - 10/21/16 1554  PT SHORT TERM GOAL #1   Title STG's=LTG's.           PT Long Term Goals - 11/21/16 1114      PT LONG TERM GOAL #1   Title Bilateral active cervical rotation= 60-65. degrees so he can turn his head more easiliy while driving.   Time 8   Period Weeks   Status On-going     PT LONG TERM GOAL #2   Title Sleep 6 hours undisturbed.   Time 8   Period Weeks   Status Achieved     PT LONG TERM GOAL #3   Title Perform ADL's with pain not > 3/10.   Time 8   Period Weeks   Status Achieved     PT LONG TERM GOAL #4   Title Eliminate headaches.   Period Weeks   Status Achieved               Plan - 06/19/17 1030    Clinical Impression Statement Patient tolerated treatment well today. Patient able to progress with all exercises today with focus on activity tolerance, strength, posture/core, and balance. Patient has limitations with ROM in cervical rotation and  bil UE movement. Today SBA for safety. Will progress per patient tolerance.   Rehab Potential Good   PT Frequency 2x / week   PT Duration 8 weeks   PT Treatment/Interventions ADLs/Self Care Home Management;Cryotherapy;Electrical Stimulation;Moist Heat;Ultrasound;Therapeutic activities;Therapeutic exercise;Patient/family education;Passive range of motion;Manual techniques;Dry needling   PT Next Visit Plan cont with POC for Comprehensive strengthening to include core exercises; balance and gait activities.     Consulted and Agree with Plan of Care Patient      Patient will benefit from skilled therapeutic intervention in order to improve the following deficits and impairments:  Pain, Decreased activity tolerance, Decreased range of motion, Postural dysfunction, Decreased strength  Visit Diagnosis: Pain in thoracic spine  Cervicalgia  Abnormal posture     Problem List Patient Active Problem List   Diagnosis Date Noted  . Ankylosing spondylitis of cervicothoracic region ([place]) 10/16/2016  . TIA (transient ischemic attack) 08/10/2015  . Carotid stenosis 08/04/2014  . Hyperlipidemia 02/21/2012  . BENIGN PROSTATIC HYPERTROPHY, WITH OBSTRUCTION 01/15/2010  . Essential hypertension 11/10/2009  . GERD 02/22/2008  . NEPHROLITHIASIS, HX OF 11/17/2007    Phillips Climes, PTA 06/19/2017, 10:34 AM  [facility] [place], [place], [address] Phone: [phone]   Fax:  [phone]  Name: [name] MRN: [id number] Date of Birth: 01-27-1936

## 2017-06-23 ENCOUNTER — Encounter: Payer: Self-pay | Admitting: Physical Therapy

## 2017-06-23 ENCOUNTER — Ambulatory Visit: Payer: Medicare Other | Admitting: Physical Therapy

## 2017-06-23 DIAGNOSIS — M546 Pain in thoracic spine: Secondary | ICD-10-CM | POA: Diagnosis not present

## 2017-06-23 DIAGNOSIS — M542 Cervicalgia: Secondary | ICD-10-CM | POA: Diagnosis not present

## 2017-06-23 DIAGNOSIS — R293 Abnormal posture: Secondary | ICD-10-CM | POA: Diagnosis not present

## 2017-06-23 NOTE — Patient Instructions (Signed)
   Laying on your back: press your head down in the pillow, tucking your chin toward your chest. Hold 5 seconds Repeat 10 times, twice a day    Bring your ear toward your chest, holding with the same side arm Hold 5 seconds, repeat 10 times, twice a day   Hold the side of the chair to keep one shoulder down while you turn your head toward the arm you are holding the chair with, hold 5 seconds, repeat 10 times, twice a day

## 2017-06-23 NOTE — Therapy (Signed)
[facility] [place], [place], [address] Phone: [phone]   Fax:  [phone]  Physical Therapy Treatment  Patient Details  Name: [name] MRN: [id number] Date of Birth: 1936-02-17 Referring Provider: Gavin Pound, MD  Encounter Date: June 27, 2017      PT End of Session - 06/27/17 1001    Visit Number 3   Number of Visits 16   Date for PT Re-Evaluation 01-Aug-2017   Authorization Type G-codes every 10th visit, KX modifier at visit 15   PT Start Time 0945   PT Stop Time 1030   PT Time Calculation (min) 45 min   Activity Tolerance Patient tolerated treatment well   Behavior During Therapy [facility] for tasks assessed/performed      Past Medical History:  Diagnosis Date  . Ankylosing spondylitis ([place]) dx'd ~ 1974  . Arthritis    "back" (08/10/2015)  . BENIGN PROSTATIC HYPERTROPHY, WITH OBSTRUCTION 01/15/2010  . Bleeding duodenal ulcer   . Bleeding esophageal ulcer   . Bleeding stomach ulcer   . GERD 02/22/2008  . History of blood transfusion 1988   "lost ~ 1/2 of my blood volume from multiple bleeding ulcers"  . History of hiatal hernia   . HYPERGLYCEMIA 11/18/2007  . HYPERLIPIDEMIA 02/22/2008  . HYPERTENSION, UNSPECIFIED 11/10/2009  . Kidney stones   . Osteoarthritis    c-spine  . PEPTIC ULCER DISEASE 11/17/2007  . Situational depression    "son died in [place] 06-28-15"  . TIA (transient ischemic attack)    "not that I know of in the past; they are trying to determine if I've had one today (08/10/2015)"    Past Surgical History:  Procedure Laterality Date  . CATARACT EXTRACTION W/ INTRAOCULAR LENS  IMPLANT, BILATERAL Bilateral 2013    There were no vitals filed for this visit.      Subjective Assessment - June 27, 2017 0959    Subjective Pt arriving to therapy with no pain. Still wanting to progress with strength.   Pertinent History OA; TIA.   Limitations Sitting   How long can you sit comfortably? 20 minutes.    Patient Stated Goals Move better.   Currently in Pain? No/denies            [facility] PT Assessment - 2017/06/27 0001      Assessment   Medical Diagnosis Ankylosing Spondylilis   Referring Provider Gavin Pound, MD     Precautions   Precautions None     Restrictions   Weight Bearing Restrictions No     Balance Screen   Has the patient fallen in the past 6 months No                     [facility] Adult PT Treatment/Exercise - June 27, 2017 0001      Lumbar Exercises: Aerobic   Stationary Bike nustep L5 x87min, UE/LE activity with posture focus     Lumbar Exercises: Supine   Bridge 20 reps;3 seconds   Other Supine Lumbar Exercises shoulder flexion with cane x 15 reps   Other Supine Lumbar Exercises Cervical retra     Shoulder Exercises: Supine   Flexion AAROM;15 reps;Limitations   Flexion Limitations with cane pt limited to 110 degrees   Other Supine Exercises shoulder presses with cane bringing elbows to table for pect stretch holding 5 seconds each rep x 15     Shoulder Exercises: Seated   Other Seated Exercises cervical rotation: x 3 each side, holding 10 seconds each, Upper  Trap stretch x 3 reps each side holding 10 seconds     Shoulder Exercises: Pulleys   Flexion 3 minutes     Shoulder Exercises: Stretch   Corner Stretch 2 reps;20 seconds                PT Education - 06/23/17 0959    Education provided Yes   Education Details Reviewed HEP, supine open book stretch with towel roll   Person(s) Educated Patient   Methods Explanation;Demonstration   Comprehension Verbalized understanding;Returned demonstration          PT Short Term Goals - 10/21/16 1554      PT SHORT TERM GOAL #1   Title STG's=LTG's.           PT Long Term Goals - 06/23/17 1003      PT LONG TERM GOAL #1   Title Bilateral active cervical rotation= 60-65. degrees so he can turn his head more easiliy while driving.   Period Weeks   Status New     PT LONG TERM GOAL #2    Title Sleep 6 hours undisturbed.   Time 8   Period Weeks   Status New     PT LONG TERM GOAL #3   Title Perform ADL's with pain not > 3/10.   Time 8   Period Weeks     PT LONG TERM GOAL #4   Title Eliminate headaches.   Time 8   Period Weeks   Status Achieved               Plan - 06/23/17 1002    Clinical Impression Statement Pt tolertating treatment well today. Pt progressing toward strengthening exercises to improve core and balance. Skilled PT needed to progress pt toward his LTG's.    Rehab Potential Good   PT Frequency 2x / week   PT Duration 8 weeks   PT Treatment/Interventions ADLs/Self Care Home Management;Cryotherapy;Electrical Stimulation;Moist Heat;Ultrasound;Therapeutic activities;Therapeutic exercise;Patient/family education;Passive range of motion;Manual techniques;Dry needling   PT Next Visit Plan cont with POC for Comprehensive strengthening to include core exercises; balance and gait activities.     PT Home Exercise Plan supine lying in cross position with chin tucks, open book thoracic stretch with towel roll under spine in supine position   Consulted and Agree with Plan of Care Patient      Patient will benefit from skilled therapeutic intervention in order to improve the following deficits and impairments:  Pain, Decreased activity tolerance, Decreased range of motion, Postural dysfunction, Decreased strength  Visit Diagnosis: Pain in thoracic spine  Cervicalgia  Abnormal posture     Problem List Patient Active Problem List   Diagnosis Date Noted  . Ankylosing spondylitis of cervicothoracic region ([place]) 10/16/2016  . TIA (transient ischemic attack) 08/10/2015  . Carotid stenosis 08/04/2014  . Hyperlipidemia 02/21/2012  . BENIGN PROSTATIC HYPERTROPHY, WITH OBSTRUCTION 01/15/2010  . Essential hypertension 11/10/2009  . GERD 02/22/2008  . NEPHROLITHIASIS, HX OF 11/17/2007    [name], MPT 06/23/2017, 10:50 AM  [facility] [address] [place], [place], [address] Phone: [phone]   Fax:  [phone]  Name: [name] MRN: [id number] Date of Birth: Dec 07, 1935

## 2017-06-26 ENCOUNTER — Ambulatory Visit: Payer: Medicare Other | Attending: Rheumatology | Admitting: Physical Therapy

## 2017-06-26 ENCOUNTER — Encounter: Payer: Self-pay | Admitting: Physical Therapy

## 2017-06-26 DIAGNOSIS — M542 Cervicalgia: Secondary | ICD-10-CM | POA: Insufficient documentation

## 2017-06-26 DIAGNOSIS — R293 Abnormal posture: Secondary | ICD-10-CM | POA: Diagnosis not present

## 2017-06-26 DIAGNOSIS — M546 Pain in thoracic spine: Secondary | ICD-10-CM | POA: Diagnosis not present

## 2017-06-26 NOTE — Therapy (Signed)
[facility] [place], [place], [address] Phone: [phone]   Fax:  [phone]  Physical Therapy Treatment  Patient Details  Name: [name] MRN: [id number] Date of Birth: 1935/11/29 Referring Provider: Gavin Pound, MD  Encounter Date: 06/26/2017      PT End of Session - 06/26/17 1031    Visit Number 4   Number of Visits 16   Date for PT Re-Evaluation 2017/08/04   Authorization Type G-codes every 10th visit, KX modifier at visit 15   PT Start Time 0954   PT Stop Time 1034   PT Time Calculation (min) 40 min   Activity Tolerance Patient tolerated treatment well   Behavior During Therapy [facility] for tasks assessed/performed      Past Medical History:  Diagnosis Date  . Ankylosing spondylitis ([place]) dx'd ~ 1974  . Arthritis    "back" (08/10/2015)  . BENIGN PROSTATIC HYPERTROPHY, WITH OBSTRUCTION 01/15/2010  . Bleeding duodenal ulcer   . Bleeding esophageal ulcer   . Bleeding stomach ulcer   . GERD 02/22/2008  . History of blood transfusion 1988   "lost ~ 1/2 of my blood volume from multiple bleeding ulcers"  . History of hiatal hernia   . HYPERGLYCEMIA 11/18/2007  . HYPERLIPIDEMIA 02/22/2008  . HYPERTENSION, UNSPECIFIED 11/10/2009  . Kidney stones   . Osteoarthritis    c-spine  . PEPTIC ULCER DISEASE 11/17/2007  . Situational depression    "son died in [place] 07/01/15"  . TIA (transient ischemic attack)    "not that I know of in the past; they are trying to determine if I've had one today (08/10/2015)"    Past Surgical History:  Procedure Laterality Date  . CATARACT EXTRACTION W/ INTRAOCULAR LENS  IMPLANT, BILATERAL Bilateral 2013    There were no vitals filed for this visit.      Subjective Assessment - 06/26/17 1001    Subjective Pt arriving to therapy with no pain. Still wanting to progress with strength.   Pertinent History OA; TIA.   Limitations Sitting   How long can you sit comfortably? 20 minutes.   Patient Stated Goals Move better.   Currently in Pain? No/denies                         [facility] Adult PT Treatment/Exercise - 06/26/17 0001      Exercises   Exercises Neck;Shoulder;Lumbar;Knee/Hip     Neck Exercises: Supine   Neck Retraction 20 reps     Lumbar Exercises: Stretches   Prone on Elbows Stretch 10 seconds  x10-15     Lumbar Exercises: Aerobic   Stationary Bike nustep L5-6 x16 min (1000 steps), UE/LE activity with posture focus     Lumbar Exercises: Standing   Other Standing Lumbar Exercises rows with green XTS 2x10     Lumbar Exercises: Supine   Bridge 20 reps;3 seconds     Knee/Hip Exercises: Standing   Hip Extension AROM;Knee straight;2 sets;10 reps  for hip stretch   Rocker Board 3 minutes     Shoulder Exercises: Pulleys   Flexion 3 minutes  with towel roll for posture     Shoulder Exercises: Stretch   Corner Stretch 10 seconds  x15                  PT Short Term Goals - 10/21/16 1554      PT SHORT TERM GOAL #1   Title STG's=LTG's.  PT Long Term Goals - 06/23/17 1003      PT LONG TERM GOAL #1   Title Bilateral active cervical rotation= 60-65. degrees so he can turn his head more easiliy while driving.   Period Weeks   Status New     PT LONG TERM GOAL #2   Title Sleep 6 hours undisturbed.   Time 8   Period Weeks   Status New     PT LONG TERM GOAL #3   Title Perform ADL's with pain not > 3/10.   Time 8   Period Weeks     PT LONG TERM GOAL #4   Title Eliminate headaches.   Time 8   Period Weeks   Status Achieved               Plan - 06/26/17 1032    Clinical Impression Statement Patient tolrated treatment well today. Patient able to progress with upper body stretches, core strengthening and activity tolerance today. Patient reported no pain pre or post treatment. Goals progressing. Patient able to put on jacket after treatment with greater ease today.    Rehab Potential Good   PT  Frequency 2x / week   PT Duration 8 weeks   PT Treatment/Interventions ADLs/Self Care Home Management;Cryotherapy;Electrical Stimulation;Moist Heat;Ultrasound;Therapeutic activities;Therapeutic exercise;Patient/family education;Passive range of motion;Manual techniques;Dry needling   PT Next Visit Plan cont with POC for Comprehensive strengthening/corner stretch/prone press/ core exercises; balance and gait activities.     Consulted and Agree with Plan of Care Patient      Patient will benefit from skilled therapeutic intervention in order to improve the following deficits and impairments:  Pain, Decreased activity tolerance, Decreased range of motion, Postural dysfunction, Decreased strength  Visit Diagnosis: Pain in thoracic spine  Cervicalgia  Abnormal posture     Problem List Patient Active Problem List   Diagnosis Date Noted  . Ankylosing spondylitis of cervicothoracic region ([place]) 10/16/2016  . TIA (transient ischemic attack) 08/10/2015  . Carotid stenosis 08/04/2014  . Hyperlipidemia 02/21/2012  . BENIGN PROSTATIC HYPERTROPHY, WITH OBSTRUCTION 01/15/2010  . Essential hypertension 11/10/2009  . GERD 02/22/2008  . NEPHROLITHIASIS, HX OF 11/17/2007    Phillips Climes, PTA 06/26/2017, 10:36 AM  [facility] [place], [place], [address] Phone: [phone]   Fax:  [phone]  Name: FERAS GARDELLA MRN: [id number] Date of Birth: 1935/09/12

## 2017-06-30 ENCOUNTER — Ambulatory Visit: Payer: Medicare Other | Admitting: Physical Therapy

## 2017-06-30 ENCOUNTER — Encounter: Payer: Self-pay | Admitting: Physical Therapy

## 2017-06-30 DIAGNOSIS — M546 Pain in thoracic spine: Secondary | ICD-10-CM

## 2017-06-30 DIAGNOSIS — R293 Abnormal posture: Secondary | ICD-10-CM | POA: Diagnosis not present

## 2017-06-30 DIAGNOSIS — M542 Cervicalgia: Secondary | ICD-10-CM | POA: Diagnosis not present

## 2017-06-30 NOTE — Therapy (Signed)
[facility] [place], [place], [address] Phone: [phone]   Fax:  [phone]  Physical Therapy Treatment  Patient Details  Name: [name] MRN: [id number] Date of Birth: Nov 04, 1935 Referring Provider: Gavin Pound, MD   Encounter Date: 06/30/2017  PT End of Session - 06/30/17 1002    Visit Number  5    Number of Visits  16    Date for PT Re-Evaluation  07/28/17    Authorization Type  G-codes every 10th visit, KX modifier at visit 15    PT Start Time  0945    PT Stop Time  1030    PT Time Calculation (min)  45 min    Activity Tolerance  Patient tolerated treatment well    Behavior During Therapy  [facility] for tasks assessed/performed       Past Medical History:  Diagnosis Date  . Ankylosing spondylitis ([place]) dx'd ~ 1974  . Arthritis    "back" (08/10/2015)  . BENIGN PROSTATIC HYPERTROPHY, WITH OBSTRUCTION 01/15/2010  . Bleeding duodenal ulcer   . Bleeding esophageal ulcer   . Bleeding stomach ulcer   . GERD 02/22/2008  . History of blood transfusion 1988   "lost ~ 1/2 of my blood volume from multiple bleeding ulcers"  . History of hiatal hernia   . HYPERGLYCEMIA 11/18/2007  . HYPERLIPIDEMIA 02/22/2008  . HYPERTENSION, UNSPECIFIED 11/10/2009  . Kidney stones   . Osteoarthritis    c-spine  . PEPTIC ULCER DISEASE 11/17/2007  . Situational depression    "son died in [place] 2015/07/10"  . TIA (transient ischemic attack)    "not that I know of in the past; they are trying to determine if I've had one today (08/10/2015)"    Past Surgical History:  Procedure Laterality Date  . CATARACT EXTRACTION W/ INTRAOCULAR LENS  IMPLANT, BILATERAL Bilateral 2013    There were no vitals filed for this visit.  Subjective Assessment - 06/30/17 0957    Subjective  Pt arriving today with no pain reported. Pt still reporting weakness.    Pertinent History  OA; TIA.    Patient Stated Goals  Move better.    Currently in Pain?  No/denies          [facility] PT Assessment - 06/30/17 0001      Assessment   Medical Diagnosis  Ankylosing Spondylilis    Referring Provider  Gavin Pound, MD      Precautions   Precautions  None      Restrictions   Weight Bearing Restrictions  No      Balance Screen   Has the patient fallen in the past 6 months  No      ROM / Strength   AROM / PROM / Strength  AROM;Strength      AROM   Overall AROM Comments  forward head limited cervical rotation    Cervical - Right Rotation  45    Cervical - Left Rotation  50                  [facility] Adult PT Treatment/Exercise - 06/30/17 0001      Exercises   Exercises  Neck;Shoulder;Lumbar;Knee/Hip      Neck Exercises: Supine   Other Supine Exercise  supine: open book with towel roll under spine, supine marching with towel roll under spine, attempted shoulder's at 45degrees and ER but pt unable to hold range due to discomfort and ROM limtations.       Lumbar Exercises:  Sidelying   Clam  15 reps;2 seconds    Other Sidelying Lumbar Exercises  Trunk Rotation the pelvis stablization to prevent compensation during stretch, holding 60 seconds x 3 on each side.       Knee/Hip Exercises: Aerobic   Nustep  L6 x 15 minutes (1436 steps      Knee/Hip Exercises: Supine   Straight Leg Raises  Strengthening;15 reps      Shoulder Exercises: Pulleys   Flexion  3 minutes             PT Education - 06/30/17 1000    Education provided  Yes    Education Details  Posture correction at wall and reviewed supine open book stretch    Person(s) Educated  Patient    Methods  Explanation;Demonstration    Comprehension  Verbalized understanding          PT Long Term Goals - 06/30/17 1008      PT LONG TERM GOAL #1   Title  Bilateral active cervical rotation= 60-65. degrees so he can turn his head more easiliy while driving.    Period  Weeks    Status  On-going      PT LONG TERM GOAL #2   Title  Sleep 6 hours undisturbed.    Time  8     Period  Weeks    Status  New      PT LONG TERM GOAL #3   Title  Perform ADL's with pain not > 3/10.    Time  8    Period  Weeks    Status  New      PT LONG TERM GOAL #4   Title  Eliminate headaches.    Time  8    Period  Weeks    Status  Achieved              Patient will benefit from skilled therapeutic intervention in order to improve the following deficits and impairments:     Visit Diagnosis: Pain in thoracic spine  Cervicalgia  Abnormal posture     Problem List Patient Active Problem List   Diagnosis Date Noted  . Ankylosing spondylitis of cervicothoracic region ([place]) 10/16/2016  . TIA (transient ischemic attack) 08/10/2015  . Carotid stenosis 08/04/2014  . Hyperlipidemia 02/21/2012  . BENIGN PROSTATIC HYPERTROPHY, WITH OBSTRUCTION 01/15/2010  . Essential hypertension 11/10/2009  . GERD 02/22/2008  . NEPHROLITHIASIS, HX OF 11/17/2007    [name], MPT 06/30/2017, 11:04 AM  [facility] [address] [place], [place], [address] Phone: [phone]   Fax:  [phone]  Name: LINN GOETZE MRN: [id number] Date of Birth: 1935-11-27

## 2017-07-03 ENCOUNTER — Encounter: Payer: Self-pay | Admitting: Physical Therapy

## 2017-07-03 ENCOUNTER — Ambulatory Visit: Payer: Medicare Other | Admitting: Physical Therapy

## 2017-07-03 DIAGNOSIS — M542 Cervicalgia: Secondary | ICD-10-CM | POA: Diagnosis not present

## 2017-07-03 DIAGNOSIS — R293 Abnormal posture: Secondary | ICD-10-CM | POA: Diagnosis not present

## 2017-07-03 DIAGNOSIS — M546 Pain in thoracic spine: Secondary | ICD-10-CM | POA: Diagnosis not present

## 2017-07-03 NOTE — Therapy (Signed)
[facility] [place], [place], [address] Phone: [phone]   Fax:  [phone]  Physical Therapy Treatment  Patient Details  Name: [name] MRN: [id number] Date of Birth: 07-06-1936 Referring Provider: Gavin Pound, MD   Encounter Date: 07/03/2017  PT End of Session - 07/03/17 1025    Visit Number  6    Number of Visits  16    Date for PT Re-Evaluation  07/28/17    Authorization Type  G-codes every 10th visit, KX modifier at visit 15    PT Start Time  0948    PT Stop Time  1030    PT Time Calculation (min)  42 min    Activity Tolerance  Patient tolerated treatment well    Behavior During Therapy  [facility] for tasks assessed/performed       Past Medical History:  Diagnosis Date  . Ankylosing spondylitis ([place]) dx'd ~ 1974  . Arthritis    "back" (08/10/2015)  . BENIGN PROSTATIC HYPERTROPHY, WITH OBSTRUCTION 01/15/2010  . Bleeding duodenal ulcer   . Bleeding esophageal ulcer   . Bleeding stomach ulcer   . GERD 02/22/2008  . History of blood transfusion 1988   "lost ~ 1/2 of my blood volume from multiple bleeding ulcers"  . History of hiatal hernia   . HYPERGLYCEMIA 11/18/2007  . HYPERLIPIDEMIA 02/22/2008  . HYPERTENSION, UNSPECIFIED 11/10/2009  . Kidney stones   . Osteoarthritis    c-spine  . PEPTIC ULCER DISEASE 11/17/2007  . Situational depression    "son died in [place] 07/07/2015"  . TIA (transient ischemic attack)    "not that I know of in the past; they are trying to determine if I've had one today (08/10/2015)"    Past Surgical History:  Procedure Laterality Date  . CATARACT EXTRACTION W/ INTRAOCULAR LENS  IMPLANT, BILATERAL Bilateral 2013    There were no vitals filed for this visit.  Subjective Assessment - 07/03/17 0956    Subjective  Pt arriving today with no pain reported. Pt still reporting weakness.    Pertinent History  OA; TIA.    Limitations  Sitting    How long can you sit comfortably?  20  minutes.    Patient Stated Goals  Move better.    Currently in Pain?  No/denies                      [facility] Adult PT Treatment/Exercise - 07/03/17 0001      Neck Exercises: Machines for Strengthening   UBE (Upper Arm Bike)  3min at 120rpm      Lumbar Exercises: Aerobic   Stationary Bike  nustep L6 x16 min (1000 steps), UE/LE activity with posture focus      Shoulder Exercises: Seated   Other Seated Exercises  seated scap retrations with boulster 5-10sec hold with cervical rotations      Shoulder Exercises: Prone   Other Prone Exercises  prone press up on elbows 2x10 cues required      Shoulder Exercises: Pulleys   Flexion  3 minutes with towel roll behind back for posture cues   with towel roll behind back for posture cues     Shoulder Exercises: ROM/Strengthening   Wall Pushups  20 reps with cervical rotation   with cervical rotation   Other ROM/Strengthening Exercises  wall angel modified 2x10      Shoulder Exercises: Stretch   Corner Stretch  4 reps;30 seconds  PT Long Term Goals - 06/30/17 1008      PT LONG TERM GOAL #1   Title  Bilateral active cervical rotation= 60-65. degrees so he can turn his head more easiliy while driving.    Period  Weeks    Status  On-going      PT LONG TERM GOAL #2   Title  Sleep 6 hours undisturbed.    Time  8    Period  Weeks    Status  New      PT LONG TERM GOAL #3   Title  Perform ADL's with pain not > 3/10.    Time  8    Period  Weeks    Status  New      PT LONG TERM GOAL #4   Title  Eliminate headaches.    Time  8    Period  Weeks    Status  Achieved            Plan - 07/03/17 1027    Clinical Impression Statement  Patient tolerated treatment well today. Today focused on posture and cervical rotation today. Patient progressing well and feels some improvement with function and posture. Current goals ongoing.     Rehab Potential  Good    PT Frequency  2x / week    PT  Duration  8 weeks    PT Treatment/Interventions  ADLs/Self Care Home Management;Cryotherapy;Electrical Stimulation;Moist Heat;Ultrasound;Therapeutic activities;Therapeutic exercise;Patient/family education;Passive range of motion;Manual techniques;Dry needling    PT Next Visit Plan  cont with POC for Comprehensive strengthening/corner stretch/prone press/ core exercises; balance and gait activities.      Consulted and Agree with Plan of Care  Patient       Patient will benefit from skilled therapeutic intervention in order to improve the following deficits and impairments:  Pain, Decreased activity tolerance, Decreased range of motion, Postural dysfunction, Decreased strength  Visit Diagnosis: Pain in thoracic spine  Cervicalgia  Abnormal posture     Problem List Patient Active Problem List   Diagnosis Date Noted  . Ankylosing spondylitis of cervicothoracic region ([place]) 10/16/2016  . TIA (transient ischemic attack) 08/10/2015  . Carotid stenosis 08/04/2014  . Hyperlipidemia 02/21/2012  . BENIGN PROSTATIC HYPERTROPHY, WITH OBSTRUCTION 01/15/2010  . Essential hypertension 11/10/2009  . GERD 02/22/2008  . NEPHROLITHIASIS, HX OF 11/17/2007    Phillips Climes, PTA 07/03/2017, 10:38 AM  [facility] [place], [place], [address] Phone: [phone]   Fax:  [phone]  Name: KEIYON PLACK MRN: [id number] Date of Birth: 1935/12/11

## 2017-07-07 ENCOUNTER — Encounter: Payer: Self-pay | Admitting: Physical Therapy

## 2017-07-07 ENCOUNTER — Ambulatory Visit: Payer: Medicare Other | Admitting: Physical Therapy

## 2017-07-07 DIAGNOSIS — M546 Pain in thoracic spine: Secondary | ICD-10-CM

## 2017-07-07 DIAGNOSIS — M542 Cervicalgia: Secondary | ICD-10-CM

## 2017-07-07 DIAGNOSIS — R293 Abnormal posture: Secondary | ICD-10-CM

## 2017-07-07 NOTE — Therapy (Signed)
[facility] [place], [place], [address] Phone: [phone]   Fax:  [phone]  Physical Therapy Treatment  Patient Details  Name: [name] MRN: [id number] Date of Birth: 1935-12-25 Referring Provider: Gavin Pound, MD   Encounter Date: 07/07/2017  PT End of Session - 07/07/17 0925    Visit Number  7    Number of Visits  16    Date for PT Re-Evaluation  07/28/17    Authorization Type  G-codes every 10th visit, KX modifier at visit 15    PT Start Time  0900    PT Stop Time  0948    PT Time Calculation (min)  48 min    Activity Tolerance  Patient tolerated treatment well    Behavior During Therapy  [facility] for tasks assessed/performed       Past Medical History:  Diagnosis Date  . Ankylosing spondylitis ([place]) dx'd ~ 1974  . Arthritis    "back" (08/10/2015)  . BENIGN PROSTATIC HYPERTROPHY, WITH OBSTRUCTION 01/15/2010  . Bleeding duodenal ulcer   . Bleeding esophageal ulcer   . Bleeding stomach ulcer   . GERD 02/22/2008  . History of blood transfusion 1988   "lost ~ 1/2 of my blood volume from multiple bleeding ulcers"  . History of hiatal hernia   . HYPERGLYCEMIA 11/18/2007  . HYPERLIPIDEMIA 02/22/2008  . HYPERTENSION, UNSPECIFIED 11/10/2009  . Kidney stones   . Osteoarthritis    c-spine  . PEPTIC ULCER DISEASE 11/17/2007  . Situational depression    "son died in [place] 07-02-2015"  . TIA (transient ischemic attack)    "not that I know of in the past; they are trying to determine if I've had one today (08/10/2015)"    Past Surgical History:  Procedure Laterality Date  . CATARACT EXTRACTION W/ INTRAOCULAR LENS  IMPLANT, BILATERAL Bilateral 2013    There were no vitals filed for this visit.  Subjective Assessment - 07/07/17 0925    Subjective  No new complaints.    Pain Score  1     Pain Location  Back    Pain Orientation  Right;Left;Mid    Pain Onset  More than a month ago                       [facility] Adult PT Treatment/Exercise - 07/07/17 0001      Exercises   Exercises  Knee/Hip      Lumbar Exercises: Aerobic   Stationary Bike  Nustep level 6 x 15 minutes.    UBE (Upper Arm Bike)  In standing 3 minutes forward and 3 minutes backward (6 minutes total).      Lumbar Exercises: Standing   Other Standing Lumbar Exercises  All performed to fatigue x 2:  Green XTS scap squeezes with elbows high; bilateral shoulder extension and rows and then forward facing punches.      Shoulder Exercises: Pulleys   Flexion  3 minutes          Balance Exercises - 07/07/17 0953      Balance Exercises: Standing   Other Standing Exercises  In parallel bars on inverted BOSU ball x 5 minutes.             PT Long Term Goals - 06/30/17 1008      PT LONG TERM GOAL #1   Title  Bilateral active cervical rotation= 60-65. degrees so he can turn his head more easiliy while driving.    Period  Weeks    Status  On-going      PT LONG TERM GOAL #2   Title  Sleep 6 hours undisturbed.    Time  8    Period  Weeks    Status  New      PT LONG TERM GOAL #3   Title  Perform ADL's with pain not > 3/10.    Time  8    Period  Weeks    Status  New      PT LONG TERM GOAL #4   Title  Eliminate headaches.    Time  8    Period  Weeks    Status  Achieved              Patient will benefit from skilled therapeutic intervention in order to improve the following deficits and impairments:     Visit Diagnosis: Pain in thoracic spine  Cervicalgia  Abnormal posture     Problem List Patient Active Problem List   Diagnosis Date Noted  . Ankylosing spondylitis of cervicothoracic region ([place]) 10/16/2016  . TIA (transient ischemic attack) 08/10/2015  . Carotid stenosis 08/04/2014  . Hyperlipidemia 02/21/2012  . BENIGN PROSTATIC HYPERTROPHY, WITH OBSTRUCTION 01/15/2010  . Essential hypertension 11/10/2009  . GERD 02/22/2008  . NEPHROLITHIASIS, HX OF  11/17/2007    APPLEGATE, [name] MPT 07/07/2017, 9:57 AM  [facility] [address] [place], [place], [address] Phone: [phone]   Fax:  [phone]  Name: [name] MRN: [id number] Date of Birth: 01/15/1936

## 2017-07-10 ENCOUNTER — Ambulatory Visit: Payer: Medicare Other | Admitting: Physical Therapy

## 2017-07-10 ENCOUNTER — Encounter: Payer: Self-pay | Admitting: Physical Therapy

## 2017-07-10 DIAGNOSIS — R293 Abnormal posture: Secondary | ICD-10-CM

## 2017-07-10 DIAGNOSIS — M546 Pain in thoracic spine: Secondary | ICD-10-CM

## 2017-07-10 DIAGNOSIS — M542 Cervicalgia: Secondary | ICD-10-CM | POA: Diagnosis not present

## 2017-07-10 NOTE — Therapy (Signed)
[facility] [place], [place], [address] Phone: [phone]   Fax:  [phone]  Physical Therapy Treatment  Patient Details  Name: [name] MRN: [id number] Date of Birth: [birth year] Referring Provider: Gavin Pound, MD   Encounter Date: 07/10/2017  PT End of Session - 07/10/17 1029    Visit Number  8    Number of Visits  16    Date for PT Re-Evaluation  07/28/17    Authorization Type  G-codes every 10th visit, KX modifier at visit 15    PT Start Time  0945    PT Stop Time  1030    PT Time Calculation (min)  45 min       Past Medical History:  Diagnosis Date  . Ankylosing spondylitis ([place]) dx'd ~ 1974  . Arthritis    "back" (08/10/2015)  . BENIGN PROSTATIC HYPERTROPHY, WITH OBSTRUCTION 01/15/2010  . Bleeding duodenal ulcer   . Bleeding esophageal ulcer   . Bleeding stomach ulcer   . GERD 02/22/2008  . History of blood transfusion 1988   "lost ~ 1/2 of my blood volume from multiple bleeding ulcers"  . History of hiatal hernia   . HYPERGLYCEMIA 11/18/2007  . HYPERLIPIDEMIA 02/22/2008  . HYPERTENSION, UNSPECIFIED 11/10/2009  . Kidney stones   . Osteoarthritis    c-spine  . PEPTIC ULCER DISEASE 11/17/2007  . Situational depression    "son died in [place] Jul 13, 2015"  . TIA (transient ischemic attack)    "not that I know of in the past; they are trying to determine if I've had one today (08/10/2015)"    Past Surgical History:  Procedure Laterality Date  . CATARACT EXTRACTION W/ INTRAOCULAR LENS  IMPLANT, BILATERAL Bilateral 2013    There were no vitals filed for this visit.  Subjective Assessment - 07/10/17 1147    Subjective  Pt arriving to therapy reporting no new complaints    Pertinent History  OA; TIA.    Limitations  Sitting    How long can you sit comfortably?  20 minutes.    Patient Stated Goals  Move better.    Currently in Pain?  No/denies    Pain Score  0-No pain         [facility] PT Assessment -  07/10/17 0001      Assessment   Medical Diagnosis  Ankylosing Spondylilis    Referring Provider  Gavin Pound, MD      Precautions   Precautions  None      Restrictions   Weight Bearing Restrictions  No      Balance Screen   Has the patient fallen in the past 6 months  No      AROM   Cervical - Right Rotation  45    Cervical - Left Rotation  50                  [facility] Adult PT Treatment/Exercise - 07/10/17 0001      Exercises   Exercises  Knee/Hip      Neck Exercises: Supine   Neck Retraction  20 reps      Lumbar Exercises: Aerobic   Stationary Bike  Nustep level 6 x 15 minutes.    UBE (Upper Arm Bike)  In standing 3 minutes forward and 3 minutes backward (6 minutes total).      Knee/Hip Exercises: Standing   Heel Raises  Both;15 reps    Heel Raises Limitations  unable to df R foot due  to past injury    Hip ADduction  Strengthening;15 reps    Hip Extension  Stengthening;15 reps      Shoulder Exercises: Pulleys   Flexion  3 minutes      Manual Therapy   Manual Therapy  Other (comment)    Manual therapy comments  supine manual pect stretch x 5 holding 30 seconds on bilateral sides.     Other Manual Therapy  mild overpressure with supine shoulder flexion using a cane holding 10 seocnds each x 5 reps             PT Education - 07/10/17 1149    Education provided  Yes    Education Details  Importance of daily stretching and exercises     Person(s) Educated  Patient    Methods  Explanation    Comprehension  Verbalized understanding          PT Long Term Goals - 07/10/17 1155      PT LONG TERM GOAL #1   Title  Bilateral active cervical rotation= 60-65. degrees so he can turn his head more easiliy while driving.    Time  8    Period  Weeks    Status  On-going      PT LONG TERM GOAL #2   Title  Sleep 6 hours undisturbed.    Time  8    Period  Weeks      PT LONG TERM GOAL #3   Title  Perform ADL's with pain not > 3/10.    Time  8     Period  Weeks      PT LONG TERM GOAL #4   Title  Eliminate headaches.    Time  8    Period  Weeks    Status  Achieved            Plan - 07/10/17 1152    Clinical Impression Statement  Patient tolerated treatment well. Treatment focused on posture. Pt tolerating manual pect stretch well. At end of session pt reporting feeling better after his stretch.     Rehab Potential  Good    PT Frequency  2x / week    PT Duration  8 weeks    PT Treatment/Interventions  ADLs/Self Care Home Management;Cryotherapy;Electrical Stimulation;Moist Heat;Ultrasound;Therapeutic activities;Therapeutic exercise;Patient/family education;Passive range of motion;Manual techniques;Dry needling    PT Next Visit Plan  cont with POC for Comprehensive strengthening/corner stretch/prone press/ core exercises; balance and gait activities.      PT Home Exercise Plan  supine lying in cross position with chin tucks, open book thoracic stretch with towel roll under spine in supine position    Consulted and Agree with Plan of Care  Patient       Patient will benefit from skilled therapeutic intervention in order to improve the following deficits and impairments:  Pain, Decreased activity tolerance, Decreased range of motion, Postural dysfunction, Decreased strength  Visit Diagnosis: Pain in thoracic spine  Cervicalgia  Abnormal posture     Problem List Patient Active Problem List   Diagnosis Date Noted  . Ankylosing spondylitis of cervicothoracic region ([place]) 10/16/2016  . TIA (transient ischemic attack) 08/10/2015  . Carotid stenosis 08/04/2014  . Hyperlipidemia 02/21/2012  . BENIGN PROSTATIC HYPERTROPHY, WITH OBSTRUCTION 01/15/2010  . Essential hypertension 11/10/2009  . GERD 02/22/2008  . NEPHROLITHIASIS, HX OF 11/17/2007    [name], MPT 07/10/2017, 12:03 PM  [facility] [address] [place], [place], [address] Phone: [phone]  Fax:   [phone]  Name: [name] MRN: [id number] Date of Birth: 31-Jan-1936

## 2017-07-14 ENCOUNTER — Ambulatory Visit: Payer: Medicare Other | Admitting: Physical Therapy

## 2017-07-14 DIAGNOSIS — R293 Abnormal posture: Secondary | ICD-10-CM | POA: Diagnosis not present

## 2017-07-14 DIAGNOSIS — M542 Cervicalgia: Secondary | ICD-10-CM

## 2017-07-14 DIAGNOSIS — M546 Pain in thoracic spine: Secondary | ICD-10-CM

## 2017-07-14 NOTE — Therapy (Signed)
[facility] [place], [place], [address] Phone: [phone]   Fax:  [phone]  Physical Therapy Treatment  Patient Details  Name: [name] MRN: [id number] Date of Birth: [birth year] Referring Provider: Gavin Pound, MD   Encounter Date: 07/14/2017  PT End of Session - 07/14/17 1444    Visit Number  9    Number of Visits  16    Date for PT Re-Evaluation  07/28/17    Authorization Type  G-codes every 10th visit, KX modifier at visit 15    PT Start Time  1434    PT Stop Time  1523    PT Time Calculation (min)  49 min    Activity Tolerance  Patient tolerated treatment well    Behavior During Therapy  [facility] for tasks assessed/performed       Past Medical History:  Diagnosis Date  . Ankylosing spondylitis ([place]) dx'd ~ 1974  . Arthritis    "back" (08/10/2015)  . BENIGN PROSTATIC HYPERTROPHY, WITH OBSTRUCTION 01/15/2010  . Bleeding duodenal ulcer   . Bleeding esophageal ulcer   . Bleeding stomach ulcer   . GERD 02/22/2008  . History of blood transfusion 1988   "lost ~ 1/2 of my blood volume from multiple bleeding ulcers"  . History of hiatal hernia   . HYPERGLYCEMIA 11/18/2007  . HYPERLIPIDEMIA 02/22/2008  . HYPERTENSION, UNSPECIFIED 11/10/2009  . Kidney stones   . Osteoarthritis    c-spine  . PEPTIC ULCER DISEASE 11/17/2007  . Situational depression    "son died in [place] 07/15/2015"  . TIA (transient ischemic attack)    "not that I know of in the past; they are trying to determine if I've had one today (08/10/2015)"    Past Surgical History:  Procedure Laterality Date  . CATARACT EXTRACTION W/ INTRAOCULAR LENS  IMPLANT, BILATERAL Bilateral 2013    There were no vitals filed for this visit.  Subjective Assessment - 07/14/17 1439    Subjective  Pt arriving to therapy reporting no new complaints and did well after last treatment    Pertinent History  OA; TIA.    Limitations  Sitting    How long can you sit  comfortably?  20 minutes.    Patient Stated Goals  Move better.    Currently in Pain?  No/denies                      [facility] Adult PT Treatment/Exercise - 07/14/17 0001      Lumbar Exercises: Aerobic   Stationary Bike  Nustep level 7 x 73min    UBE (Upper Arm Bike)   3 minutes forward and 3 minutes backward (6 minutes total).      Knee/Hip Exercises: Machines for Strengthening   Other Machine  scap retractions 30# with cues for stabilization and technique 3x10      Shoulder Exercises: Supine   Other Supine Exercises  lay over towel roll for thoracic stretch      Shoulder Exercises: Prone   Other Prone Exercises  prone press up on elbows 2x10 cues required      Shoulder Exercises: Standing   Other Standing Exercises  red swiss ball behind back with cane UE flexion 2x10      Shoulder Exercises: Pulleys   Flexion  3 minutes with towel roll for behind thoracic      Shoulder Exercises: Stretch   Corner Stretch  4 reps;30 seconds      Manual Therapy  Manual therapy comments  supine manual pect stretch x 5 holding 30 seconds on bilateral sides.                   PT Long Term Goals - 07/10/17 1155      PT LONG TERM GOAL #1   Title  Bilateral active cervical rotation= 60-65. degrees so he can turn his head more easiliy while driving.    Time  8    Period  Weeks    Status  On-going      PT LONG TERM GOAL #2   Title  Sleep 6 hours undisturbed.    Time  8    Period  Weeks      PT LONG TERM GOAL #3   Title  Perform ADL's with pain not > 3/10.    Time  8    Period  Weeks      PT LONG TERM GOAL #4   Title  Eliminate headaches.    Time  8    Period  Weeks    Status  Achieved            Plan - 07/14/17 1509    Clinical Impression Statement  Patient tolerated treatment welll today. Today focused on thoracis stretching followed by posture and strengthening upper back. Patient continues to report improvement and has been doing exercises at home  daily. Patient progressing toward goals.     Rehab Potential  Good    PT Frequency  2x / week    PT Duration  8 weeks    PT Treatment/Interventions  ADLs/Self Care Home Management;Cryotherapy;Electrical Stimulation;Moist Heat;Ultrasound;Therapeutic activities;Therapeutic exercise;Patient/family education;Passive range of motion;Manual techniques;Dry needling    PT Next Visit Plan  cont with POC for Comprehensive strengthening/corner stretch/prone press/ core exercises; balance and gait activities.      Consulted and Agree with Plan of Care  Patient       Patient will benefit from skilled therapeutic intervention in order to improve the following deficits and impairments:  Pain, Decreased activity tolerance, Decreased range of motion, Postural dysfunction, Decreased strength  Visit Diagnosis: Pain in thoracic spine  Cervicalgia  Abnormal posture     Problem List Patient Active Problem List   Diagnosis Date Noted  . Ankylosing spondylitis of cervicothoracic region ([place]) 10/16/2016  . TIA (transient ischemic attack) 08/10/2015  . Carotid stenosis 08/04/2014  . Hyperlipidemia 02/21/2012  . BENIGN PROSTATIC HYPERTROPHY, WITH OBSTRUCTION 01/15/2010  . Essential hypertension 11/10/2009  . GERD 02/22/2008  . NEPHROLITHIASIS, HX OF 11/17/2007    Phillips Climes, PTA 07/14/2017, 3:23 PM  [facility] [place], [place], [address] Phone: [phone]   Fax:  [phone]  Name: [name] MRN: [id number] Date of Birth: 07/20/1936

## 2017-07-16 ENCOUNTER — Ambulatory Visit (INDEPENDENT_AMBULATORY_CARE_PROVIDER_SITE_OTHER): Payer: Medicare Other | Admitting: Orthopaedic Surgery

## 2017-07-21 ENCOUNTER — Ambulatory Visit (INDEPENDENT_AMBULATORY_CARE_PROVIDER_SITE_OTHER): Payer: Medicare Other | Admitting: Orthopaedic Surgery

## 2017-07-21 ENCOUNTER — Encounter (INDEPENDENT_AMBULATORY_CARE_PROVIDER_SITE_OTHER): Payer: Self-pay | Admitting: Orthopaedic Surgery

## 2017-07-21 ENCOUNTER — Ambulatory Visit (INDEPENDENT_AMBULATORY_CARE_PROVIDER_SITE_OTHER): Payer: Medicare Other

## 2017-07-21 ENCOUNTER — Encounter: Payer: Self-pay | Admitting: Physical Therapy

## 2017-07-21 ENCOUNTER — Telehealth (INDEPENDENT_AMBULATORY_CARE_PROVIDER_SITE_OTHER): Payer: Self-pay | Admitting: Orthopaedic Surgery

## 2017-07-21 ENCOUNTER — Ambulatory Visit: Payer: Medicare Other | Admitting: Physical Therapy

## 2017-07-21 ENCOUNTER — Encounter: Payer: Medicare Other | Admitting: Physical Therapy

## 2017-07-21 DIAGNOSIS — M546 Pain in thoracic spine: Secondary | ICD-10-CM

## 2017-07-21 DIAGNOSIS — M453 Ankylosing spondylitis of cervicothoracic region: Secondary | ICD-10-CM

## 2017-07-21 DIAGNOSIS — M542 Cervicalgia: Secondary | ICD-10-CM | POA: Diagnosis not present

## 2017-07-21 DIAGNOSIS — I6523 Occlusion and stenosis of bilateral carotid arteries: Secondary | ICD-10-CM

## 2017-07-21 DIAGNOSIS — R293 Abnormal posture: Secondary | ICD-10-CM | POA: Diagnosis not present

## 2017-07-21 NOTE — Therapy (Signed)
[facility] [place], [place], [address] Phone: [phone]   Fax:  [phone]  Physical Therapy Treatment  Patient Details  Name: [name] MRN: [id number] Date of Birth: 20-Jun-1936 Referring Provider: Gavin Pound, MD   Encounter Date: 07/21/2017  PT End of Session - 07/21/17 0928    Visit Number  10    Number of Visits  16    Date for PT Re-Evaluation  07/28/17    Authorization Type  G-codes every 10th visit, KX modifier at visit 15    PT Start Time  0901    PT Stop Time  0944    PT Time Calculation (min)  43 min    Activity Tolerance  Patient tolerated treatment well    Behavior During Therapy  [facility] for tasks assessed/performed       Past Medical History:  Diagnosis Date  . Ankylosing spondylitis ([place]) dx'd ~ 1974  . Arthritis    "back" (08/10/2015)  . BENIGN PROSTATIC HYPERTROPHY, WITH OBSTRUCTION 01/15/2010  . Bleeding duodenal ulcer   . Bleeding esophageal ulcer   . Bleeding stomach ulcer   . GERD 02/22/2008  . History of blood transfusion 1988   "lost ~ 1/2 of my blood volume from multiple bleeding ulcers"  . History of hiatal hernia   . HYPERGLYCEMIA 11/18/2007  . HYPERLIPIDEMIA 02/22/2008  . HYPERTENSION, UNSPECIFIED 11/10/2009  . Kidney stones   . Osteoarthritis    c-spine  . PEPTIC ULCER DISEASE 11/17/2007  . Situational depression    "son died in [place] 2015-06-12"  . TIA (transient ischemic attack)    "not that I know of in the past; they are trying to determine if I've had one today (08/10/2015)"    Past Surgical History:  Procedure Laterality Date  . CATARACT EXTRACTION W/ INTRAOCULAR LENS  IMPLANT, BILATERAL Bilateral 2013    There were no vitals filed for this visit.  Subjective Assessment - 07/21/17 0908    Subjective  Pt arriving to therapy reporting no new complaints and did well after last treatment    Pertinent History  OA; TIA.    Limitations  Sitting    How long can you sit  comfortably?  20 minutes.    Patient Stated Goals  Move better.    Currently in Pain?  No/denies         [facility] PT Assessment - 07/21/17 0001      AROM   AROM Assessment Site  Cervical    Cervical - Right Rotation  50    Cervical - Left Rotation  40                  [facility] Adult PT Treatment/Exercise - 07/21/17 0001      Neck Exercises: Standing   Other Standing Exercises  scap retractions x20 with red swiss ball for educational cues      Lumbar Exercises: Aerobic   Stationary Bike  Nustep level 7 x 16.20 min (1000 steps)    UBE (Upper Arm Bike)   3 minutes forward and 3 minutes backward (6 minutes total).      Lumbar Exercises: Seated   Other Seated Lumbar Exercises  seated ext for posture x20 with small round bolster      Knee/Hip Exercises: Machines for Strengthening   Other Machine  scap retractions 30# with cues for stabilization and technique 3x10      Shoulder Exercises: Stretch   Corner Stretch  4 reps;30 seconds  Manual Therapy   Manual therapy comments  supine manual pect stretch x 5 holding 30 seconds on bilateral sides.                   PT Long Term Goals - 07/21/17 0939      PT LONG TERM GOAL #1   Title  Bilateral active cervical rotation= 60-65. degrees so he can turn his head more easiliy while driving.    Time  8    Period  Weeks    Status  On-going      PT LONG TERM GOAL #2   Title  Sleep 6 hours undisturbed.    Time  8    Period  Weeks    Status  Achieved      PT LONG TERM GOAL #3   Title  Perform ADL's with pain not > 3/10.    Time  8    Period  Weeks    Status  On-going      PT LONG TERM GOAL #4   Title  Eliminate headaches.    Time  8    Period  Weeks    Status  Achieved            Plan - 07/21/17 5750    Clinical Impression Statement  Patient tolerated treatment well today. Patient progressing with posture/thoracic exercises yet has ongoing cervical rotation limitations. Patient able to perform  ADL's with greater ease yet some limitatuons ongoing. Patient FOTO 29% limitation (initial 38%) Patient able to sleep 6 hours and met goals today. Cervical rotation goal ongoing.      Rehab Potential  Good    PT Frequency  2x / week    PT Duration  8 weeks    PT Treatment/Interventions  ADLs/Self Care Home Management;Cryotherapy;Electrical Stimulation;Moist Heat;Ultrasound;Therapeutic activities;Therapeutic exercise;Patient/family education;Passive range of motion;Manual techniques;Dry needling    PT Next Visit Plan  cont with POC for Comprehensive strengthening/corner stretch/prone press/ core exercises; balance and gait activities.  MD. Ninfa Linden appt (note sent today)    Consulted and Agree with Plan of Care  Patient       Patient will benefit from skilled therapeutic intervention in order to improve the following deficits and impairments:  Pain, Decreased activity tolerance, Decreased range of motion, Postural dysfunction, Decreased strength  Visit Diagnosis: Pain in thoracic spine  Cervicalgia  Abnormal posture     Problem List Patient Active Problem List   Diagnosis Date Noted  . Ankylosing spondylitis of cervicothoracic region ([place]) 10/16/2016  . TIA (transient ischemic attack) 08/10/2015  . Carotid stenosis 08/04/2014  . Hyperlipidemia 02/21/2012  . BENIGN PROSTATIC HYPERTROPHY, WITH OBSTRUCTION 01/15/2010  . Essential hypertension 11/10/2009  . GERD 02/22/2008  . NEPHROLITHIASIS, HX OF 11/17/2007    [name], PTA 07/21/17 10:18 AM  [facility] [place], [place], [address] Phone: [phone]   Fax:  [phone]  Name: [name] MRN: [id number] Date of Birth: [birth year]

## 2017-07-21 NOTE — Telephone Encounter (Signed)
Patient requests copy of X-rays from appt 11/26. Please mail these to patient.   [place], [place] [id number]

## 2017-07-21 NOTE — Telephone Encounter (Signed)
DONE

## 2017-07-21 NOTE — Progress Notes (Signed)
The patient is here with continued follow-up due to known ankylosing spondylitis involving his spine.  We have had him in physical therapy for cervical spine he says that is helped significantly in terms of his mobility of his neck.  He would like to continue therapy for another 4-6 weeks I do feel this is helped him.  He is been having trouble getting in and out of chairs as well.  He denies any worsening pain.  He does report a tremor of his left upper extremity has been going on for a while but when he grips an object more tightly that lessens.  He denies any other acute changes in upper or lower extremities.  On examination of the cervical spine he does have improved lateral rotation lateral bending and flexion and extension.  X-rays of the cervical spine compared to films from earlier this year showed no significant change in his alignment.  The facet joints do not show significant arthritic changes from what I can see.  There is accentuation of his cervical lordosis from his disease of ankylosis spondylitis.  I gave him a note to give a therapy to extend his therapy.  I do not need to see him back for 6 months.  I would like to repeat AP and lateral cervical spine at that visit.

## 2017-07-23 ENCOUNTER — Encounter: Payer: Self-pay | Admitting: Podiatry

## 2017-07-23 ENCOUNTER — Ambulatory Visit (INDEPENDENT_AMBULATORY_CARE_PROVIDER_SITE_OTHER): Payer: Medicare Other | Admitting: Podiatry

## 2017-07-23 DIAGNOSIS — B351 Tinea unguium: Secondary | ICD-10-CM

## 2017-07-23 DIAGNOSIS — M79676 Pain in unspecified toe(s): Secondary | ICD-10-CM | POA: Diagnosis not present

## 2017-07-23 NOTE — Progress Notes (Signed)
Patient ID: [name], male   DOB: Feb 28, 1936, 81 y.o.   MRN: [id number] Complaint:  Visit Type: Patient returns to my office for continued preventative foot care services. Complaint: Patient states" my nails have grown long and thick and become painful to walk and wear shoes" . The patient presents for preventative foot care services. No changes to ROS.  Patient is unable to self treat due to ankylosing spondylitis.  Podiatric Exam: Vascular: dorsalis pedis and posterior tibial pulses are palpable bilateral. Capillary return is immediate. Temperature gradient is WNL. Skin turgor WNL  Sensorium: Normal Semmes Weinstein monofilament test. Normal tactile sensation bilaterally. Nail Exam: Pt has thick disfigured discolored nails with subungual debris noted bilateral entire nail hallux through fifth toenails Ulcer Exam: There is no evidence of ulcer or pre-ulcerative changes or infection. Orthopedic Exam: Muscle tone and strength are WNL. No limitations in general ROM. No crepitus or effusions noted. Foot type and digits show no abnormalities. Bony prominences are unremarkable. Skin: No Porokeratosis. No infection or ulcers  Diagnosis:  Onychomycosis, , Pain in right toe, pain in left toes  Treatment & Plan Procedures and Treatment: Consent by patient was obtained for treatment procedures. The patient understood the discussion of treatment and procedures well. All questions were answered thoroughly reviewed. Debridement of mycotic and hypertrophic toenails, 1 through 5 bilateral and clearing of subungual debris. No ulceration, no infection noted.                     Return Visit-Office Procedure: Patient instructed to return to the office for a follow up visit 3 months for continued evaluation and treatment.    Gardiner Barefoot DPM

## 2017-07-24 ENCOUNTER — Encounter: Payer: Self-pay | Admitting: Physical Therapy

## 2017-07-24 ENCOUNTER — Ambulatory Visit: Payer: Medicare Other | Admitting: Physical Therapy

## 2017-07-24 DIAGNOSIS — M546 Pain in thoracic spine: Secondary | ICD-10-CM

## 2017-07-24 DIAGNOSIS — M542 Cervicalgia: Secondary | ICD-10-CM

## 2017-07-24 DIAGNOSIS — R293 Abnormal posture: Secondary | ICD-10-CM | POA: Diagnosis not present

## 2017-07-24 NOTE — Therapy (Signed)
[facility] [place], [place], [address] Phone: [phone]   Fax:  [phone]  Physical Therapy Treatment  Patient Details  Name: [name] MRN: [id number] Date of Birth: 09-11-1935 Referring Provider: Gavin Pound, MD   Encounter Date: 07/24/2017  PT End of Session - 07/24/17 1055    Visit Number  11    Number of Visits  24    Date for PT Re-Evaluation  09/04/17    Authorization Type  G-codes every 10th visit, KX modifier at visit 15    PT Start Time  1030    PT Stop Time  1114    PT Time Calculation (min)  44 min    Activity Tolerance  Patient tolerated treatment well    Behavior During Therapy  [facility] for tasks assessed/performed       Past Medical History:  Diagnosis Date  . Ankylosing spondylitis ([place]) dx'd ~ 1974  . Arthritis    "back" (08/10/2015)  . BENIGN PROSTATIC HYPERTROPHY, WITH OBSTRUCTION 01/15/2010  . Bleeding duodenal ulcer   . Bleeding esophageal ulcer   . Bleeding stomach ulcer   . GERD 02/22/2008  . History of blood transfusion 1988   "lost ~ 1/2 of my blood volume from multiple bleeding ulcers"  . History of hiatal hernia   . HYPERGLYCEMIA 11/18/2007  . HYPERLIPIDEMIA 02/22/2008  . HYPERTENSION, UNSPECIFIED 11/10/2009  . Kidney stones   . Osteoarthritis    c-spine  . PEPTIC ULCER DISEASE 11/17/2007  . Situational depression    "son died in [place] 2015/07/18"  . TIA (transient ischemic attack)    "not that I know of in the past; they are trying to determine if I've had one today (08/10/2015)"    Past Surgical History:  Procedure Laterality Date  . CATARACT EXTRACTION W/ INTRAOCULAR LENS  IMPLANT, BILATERAL Bilateral 2013    There were no vitals filed for this visit.  Subjective Assessment - 07/24/17 1050    Subjective  Patient arrived and reported he had a fall landed on right wrist and hip right head yet ok today. new order for therapy.    Pertinent History  OA; TIA.    Limitations   Sitting    How long can you sit comfortably?  20 minutes.    Patient Stated Goals  Move better.    Currently in Pain?  No/denies                      [facility] Adult PT Treatment/Exercise - 07/24/17 0001      Neck Exercises: Standing   Other Standing Exercises  cervical isometrics back side to side x10 each standing with grey ball      Neck Exercises: Supine   Neck Retraction  20 reps      Lumbar Exercises: Aerobic   Stationary Bike  Nustep level 7 x 17 min (1000 steps)    UBE (Upper Arm Bike)   4 minutes forward and 4 minutes backward (6 minutes total).      Knee/Hip Exercises: Machines for Strengthening   Other Machine  scap retractions 30# with cues for stabilization and technique 3x10      Shoulder Exercises: Supine   Other Supine Exercises  cervical rotation stretch x10 10sec each    Other Supine Exercises  lay over towel roll for thoracic stretch                  PT Long Term Goals - 07/21/17 5400  PT LONG TERM GOAL #1   Title  Bilateral active cervical rotation= 60-65. degrees so he can turn his head more easiliy while driving.    Time  8    Period  Weeks    Status  On-going      PT LONG TERM GOAL #2   Title  Sleep 6 hours undisturbed.    Time  8    Period  Weeks    Status  Achieved      PT LONG TERM GOAL #3   Title  Perform ADL's with pain not > 3/10.    Time  8    Period  Weeks    Status  On-going      PT LONG TERM GOAL #4   Title  Eliminate headaches.    Time  8    Period  Weeks    Status  Achieved            Plan - 07/24/17 1108    Clinical Impression Statement  Patient tolerated treatment well today. Focused on cervical stretchng/strengthening and posture stretching/strengthening. Patient progressing toward goals with ongoing posture limitations and cervical ROM deficts.     Rehab Potential  Good    PT Frequency  2x / week    PT Duration  8 weeks    PT Treatment/Interventions  ADLs/Self Care Home  Management;Cryotherapy;Electrical Stimulation;Moist Heat;Ultrasound;Therapeutic activities;Therapeutic exercise;Patient/family education;Passive range of motion;Manual techniques;Dry needling    PT Next Visit Plan  cont with POC for Comprehensive strengthening/corner stretch/prone press/ core exercises; balance and gait activities. and C-spine per MD    Consulted and Agree with Plan of Care  Patient       Patient will benefit from skilled therapeutic intervention in order to improve the following deficits and impairments:  Pain, Decreased activity tolerance, Decreased range of motion, Postural dysfunction, Decreased strength  Visit Diagnosis: Pain in thoracic spine  Cervicalgia  Abnormal posture     Problem List Patient Active Problem List   Diagnosis Date Noted  . Ankylosing spondylitis of cervicothoracic region ([place]) 10/16/2016  . TIA (transient ischemic attack) 08/10/2015  . Carotid stenosis 08/04/2014  . Hyperlipidemia 02/21/2012  . BENIGN PROSTATIC HYPERTROPHY, WITH OBSTRUCTION 01/15/2010  . Essential hypertension 11/10/2009  . GERD 02/22/2008  . NEPHROLITHIASIS, HX OF 11/17/2007    Phillips Climes, PTA 07/24/2017, 11:29 AM  [facility] [place], [place], [address] Phone: [phone]   Fax:  [phone]  Name: [name] MRN: [id number] Date of Birth: 13-Feb-1936

## 2017-07-28 ENCOUNTER — Ambulatory Visit: Payer: Medicare Other | Attending: Rheumatology | Admitting: Physical Therapy

## 2017-07-28 DIAGNOSIS — M546 Pain in thoracic spine: Secondary | ICD-10-CM

## 2017-07-28 DIAGNOSIS — M542 Cervicalgia: Secondary | ICD-10-CM | POA: Diagnosis not present

## 2017-07-28 DIAGNOSIS — R293 Abnormal posture: Secondary | ICD-10-CM

## 2017-07-28 NOTE — Therapy (Signed)
[facility] [place], [place], [address] Phone: [phone]   Fax:  [phone]  Physical Therapy Treatment  Patient Details  Name: [name] MRN: [id number] Date of Birth: 1936-04-02 Referring Provider: Gavin Pound, MD   Encounter Date: 07/28/2017  PT End of Session - 07/28/17 1131    Visit Number  12    Number of Visits  24    Date for PT Re-Evaluation  09/04/17    Authorization Type  G-codes every 10th visit, KX modifier at visit 15    PT Start Time  [phone]    PT Stop Time  1032    PT Time Calculation (min)  44 min    Activity Tolerance  Patient tolerated treatment well    Behavior During Therapy  [facility] for tasks assessed/performed       Past Medical History:  Diagnosis Date  . Ankylosing spondylitis ([place]) dx'd ~ 1974  . Arthritis    "back" (08/10/2015)  . BENIGN PROSTATIC HYPERTROPHY, WITH OBSTRUCTION 01/15/2010  . Bleeding duodenal ulcer   . Bleeding esophageal ulcer   . Bleeding stomach ulcer   . GERD 02/22/2008  . History of blood transfusion 1988   "lost ~ 1/2 of my blood volume from multiple bleeding ulcers"  . History of hiatal hernia   . HYPERGLYCEMIA 11/18/2007  . HYPERLIPIDEMIA 02/22/2008  . HYPERTENSION, UNSPECIFIED 11/10/2009  . Kidney stones   . Osteoarthritis    c-spine  . PEPTIC ULCER DISEASE 11/17/2007  . Situational depression    "son died in [place] 2015-07-04"  . TIA (transient ischemic attack)    "not that I know of in the past; they are trying to determine if I've had one today (08/10/2015)"    Past Surgical History:  Procedure Laterality Date  . CATARACT EXTRACTION W/ INTRAOCULAR LENS  IMPLANT, BILATERAL Bilateral 2013    There were no vitals filed for this visit.  Subjective Assessment - 07/28/17 1132    Subjective  No new complaints.    Currently in Pain?  No/denies                                   PT Long Term Goals - 07/21/17 0939      PT LONG TERM  GOAL #1   Title  Bilateral active cervical rotation= 60-65. degrees so he can turn his head more easiliy while driving.    Time  8    Period  Weeks    Status  On-going      PT LONG TERM GOAL #2   Title  Sleep 6 hours undisturbed.    Time  8    Period  Weeks    Status  Achieved      PT LONG TERM GOAL #3   Title  Perform ADL's with pain not > 3/10.    Time  8    Period  Weeks    Status  On-going      PT LONG TERM GOAL #4   Title  Eliminate headaches.    Time  8    Period  Weeks    Status  Achieved            Plan - 07/28/17 1132    Clinical Impression Statement  Excellent jod today.  Patient performed exercises slowly with excellent technqiue.       Patient will benefit from skilled therapeutic intervention in order to  improve the following deficits and impairments:  Pain, Decreased activity tolerance, Decreased range of motion, Postural dysfunction, Decreased strength  Visit Diagnosis: Pain in thoracic spine  Cervicalgia  Abnormal posture     Problem List Patient Active Problem List   Diagnosis Date Noted  . Ankylosing spondylitis of cervicothoracic region ([place]) 10/16/2016  . TIA (transient ischemic attack) 08/10/2015  . Carotid stenosis 08/04/2014  . Hyperlipidemia 02/21/2012  . BENIGN PROSTATIC HYPERTROPHY, WITH OBSTRUCTION 01/15/2010  . Essential hypertension 11/10/2009  . GERD 02/22/2008  . NEPHROLITHIASIS, HX OF 11/17/2007   Treatment:  Nustep level 17 minutes f/b standing UBE at 90 RPM's with draw-in 8 minutes total (4 minutes forward and 4 minutes backward) f/b XTS orange band forward punches and scapular retraction performed to fatigue f/b hip bridges, hip bridges with steps and mini-crunches to fatigue.  Excellent job today. Shlomie Romig, [name]  MPT 07/28/2017, 11:34 AM  [facility] [address] [place], [place], [address] Phone: [phone]   Fax:  [phone]  Name: [name] MRN:  [id number] Date of Birth: [birth year]

## 2017-07-31 ENCOUNTER — Encounter: Payer: Self-pay | Admitting: Physical Therapy

## 2017-07-31 ENCOUNTER — Ambulatory Visit: Payer: Medicare Other | Admitting: Physical Therapy

## 2017-07-31 DIAGNOSIS — M542 Cervicalgia: Secondary | ICD-10-CM

## 2017-07-31 DIAGNOSIS — R293 Abnormal posture: Secondary | ICD-10-CM | POA: Diagnosis not present

## 2017-07-31 DIAGNOSIS — M546 Pain in thoracic spine: Secondary | ICD-10-CM | POA: Diagnosis not present

## 2017-07-31 NOTE — Therapy (Signed)
[facility] [place], [place], [address] Phone: [phone]   Fax:  [phone]  Physical Therapy Treatment  Patient Details  Name: [name] MRN: [id number] Date of Birth: 11/27/1935 Referring Provider: Gavin Pound, MD   Encounter Date: 07/31/2017  PT End of Session - 07/31/17 1026    Visit Number  13    Number of Visits  24    Date for PT Re-Evaluation  09/04/17    Authorization Type  G-codes every 10th visit, KX modifier at visit 15    PT Start Time  0950    PT Stop Time  1040    PT Time Calculation (min)  50 min    Activity Tolerance  Patient tolerated treatment well    Behavior During Therapy  [facility] for tasks assessed/performed       Past Medical History:  Diagnosis Date  . Ankylosing spondylitis ([place]) dx'd ~ 1974  . Arthritis    "back" (08/10/2015)  . BENIGN PROSTATIC HYPERTROPHY, WITH OBSTRUCTION 01/15/2010  . Bleeding duodenal ulcer   . Bleeding esophageal ulcer   . Bleeding stomach ulcer   . GERD 02/22/2008  . History of blood transfusion 1988   "lost ~ 1/2 of my blood volume from multiple bleeding ulcers"  . History of hiatal hernia   . HYPERGLYCEMIA 11/18/2007  . HYPERLIPIDEMIA 02/22/2008  . HYPERTENSION, UNSPECIFIED 11/10/2009  . Kidney stones   . Osteoarthritis    c-spine  . PEPTIC ULCER DISEASE 11/17/2007  . Situational depression    "son died in [place] 2015-07-13"  . TIA (transient ischemic attack)    "not that I know of in the past; they are trying to determine if I've had one today (08/10/2015)"    Past Surgical History:  Procedure Laterality Date  . CATARACT EXTRACTION W/ INTRAOCULAR LENS  IMPLANT, BILATERAL Bilateral 2013    There were no vitals filed for this visit.  Subjective Assessment - 07/31/17 0951    Subjective  No new complaints.    Pertinent History  OA; TIA.    Limitations  Sitting    How long can you sit comfortably?  20 minutes.    Patient Stated Goals  Move better.    Currently in Pain?  No/denies         [facility] PT Assessment - 07/31/17 0001      AROM   AROM Assessment Site  Cervical    Cervical - Right Rotation  40    Cervical - Left Rotation  55                  [facility] Adult PT Treatment/Exercise - 07/31/17 0001      Neck Exercises: Standing   Wall Push Ups  20 reps    Other Standing Exercises  cervical isometrics back side to side x10 each standing with grey ball      Lumbar Exercises: Aerobic   Stationary Bike  Nustep level 7 x 17 min (1000 steps)    UBE (Upper Arm Bike)   standing 3 minutes forward and 3 minutes backward (6 minutes total).      Knee/Hip Exercises: Machines for Strengthening   Other Machine  scap retractions 30# with cues for stabilization and technique 3x10      Knee/Hip Exercises: Standing   Rocker Board  5 minutes    Other Standing Knee Exercises  standing pink XTS for scap retraction and latt pull x25 each      Shoulder Exercises: Standing  Other Standing Exercises  red swiss ball behind back with cane UE flexion 2x10      Shoulder Exercises: Stretch   Corner Stretch  4 reps;30 seconds                  PT Long Term Goals - 07/21/17 0939      PT LONG TERM GOAL #1   Title  Bilateral active cervical rotation= 60-65. degrees so he can turn his head more easiliy while driving.    Time  8    Period  Weeks    Status  On-going      PT LONG TERM GOAL #2   Title  Sleep 6 hours undisturbed.    Time  8    Period  Weeks    Status  Achieved      PT LONG TERM GOAL #3   Title  Perform ADL's with pain not > 3/10.    Time  8    Period  Weeks    Status  On-going      PT LONG TERM GOAL #4   Title  Eliminate headaches.    Time  8    Period  Weeks    Status  Achieved            Plan - 07/31/17 1028    Clinical Impression Statement  Patient reported improvement overall and tolerated treatment well. Patient able to perform ADL's with some greater ease and some increased posture. Patient  comtinues to have difficulty with cervical rotation and goals ongoing.     Rehab Potential  Good    PT Frequency  2x / week    PT Duration  8 weeks    PT Treatment/Interventions  ADLs/Self Care Home Management;Cryotherapy;Electrical Stimulation;Moist Heat;Ultrasound;Therapeutic activities;Therapeutic exercise;Patient/family education;Passive range of motion;Manual techniques;Dry needling    PT Next Visit Plan  cont with POC for Comprehensive strengthening/corner stretch/prone press/ core exercises; balance and gait activities. and C-spine per MD    Consulted and Agree with Plan of Care  Patient       Patient will benefit from skilled therapeutic intervention in order to improve the following deficits and impairments:  Pain, Decreased activity tolerance, Decreased range of motion, Postural dysfunction, Decreased strength  Visit Diagnosis: Pain in thoracic spine  Abnormal posture  Cervicalgia     Problem List Patient Active Problem List   Diagnosis Date Noted  . Ankylosing spondylitis of cervicothoracic region ([place]) 10/16/2016  . TIA (transient ischemic attack) 08/10/2015  . Carotid stenosis 08/04/2014  . Hyperlipidemia 02/21/2012  . BENIGN PROSTATIC HYPERTROPHY, WITH OBSTRUCTION 01/15/2010  . Essential hypertension 11/10/2009  . GERD 02/22/2008  . NEPHROLITHIASIS, HX OF 11/17/2007    Phillips Climes, PTA 07/31/2017, 10:43 AM  [facility] [place], [place], [address] Phone: [phone]   Fax:  [phone]  Name: [name] MRN: [id number] Date of Birth: [birth year]

## 2017-08-04 ENCOUNTER — Encounter: Payer: Medicare Other | Admitting: Physical Therapy

## 2017-08-07 ENCOUNTER — Encounter: Payer: Medicare Other | Admitting: Physical Therapy

## 2017-08-07 ENCOUNTER — Other Ambulatory Visit: Payer: Self-pay | Admitting: Cardiology

## 2017-08-07 ENCOUNTER — Other Ambulatory Visit (INDEPENDENT_AMBULATORY_CARE_PROVIDER_SITE_OTHER): Payer: Self-pay | Admitting: Orthopaedic Surgery

## 2017-08-11 ENCOUNTER — Encounter: Payer: Self-pay | Admitting: Physical Therapy

## 2017-08-11 ENCOUNTER — Ambulatory Visit: Payer: Medicare Other | Admitting: Physical Therapy

## 2017-08-11 DIAGNOSIS — M546 Pain in thoracic spine: Secondary | ICD-10-CM

## 2017-08-11 DIAGNOSIS — M542 Cervicalgia: Secondary | ICD-10-CM | POA: Diagnosis not present

## 2017-08-11 DIAGNOSIS — R293 Abnormal posture: Secondary | ICD-10-CM

## 2017-08-11 NOTE — Therapy (Signed)
[facility] [place], [place], [address] Phone: [phone]   Fax:  [phone]  Physical Therapy Treatment  Patient Details  Name: [name] MRN: [id number] Date of Birth: 1935/10/11 Referring Provider: Gavin Pound, MD   Encounter Date: 08/11/2017  PT End of Session - 08/11/17 1006    Visit Number  14    Number of Visits  24    Date for PT Re-Evaluation  09/04/17    Authorization Type  G-codes every 10th visit, KX modifier at visit 15    PT Start Time  [phone]    PT Stop Time  1031    PT Time Calculation (min)  43 min    Activity Tolerance  Patient tolerated treatment well    Behavior During Therapy  [facility] for tasks assessed/performed       Past Medical History:  Diagnosis Date  . Ankylosing spondylitis ([place]) dx'd ~ 1974  . Arthritis    "back" (08/10/2015)  . BENIGN PROSTATIC HYPERTROPHY, WITH OBSTRUCTION 01/15/2010  . Bleeding duodenal ulcer   . Bleeding esophageal ulcer   . Bleeding stomach ulcer   . GERD 02/22/2008  . History of blood transfusion 1988   "lost ~ 1/2 of my blood volume from multiple bleeding ulcers"  . History of hiatal hernia   . HYPERGLYCEMIA 11/18/2007  . HYPERLIPIDEMIA 02/22/2008  . HYPERTENSION, UNSPECIFIED 11/10/2009  . Kidney stones   . Osteoarthritis    c-spine  . PEPTIC ULCER DISEASE 11/17/2007  . Situational depression    "son died in [place] 2015/07/24"  . TIA (transient ischemic attack)    "not that I know of in the past; they are trying to determine if I've had one today (08/10/2015)"    Past Surgical History:  Procedure Laterality Date  . CATARACT EXTRACTION W/ INTRAOCULAR LENS  IMPLANT, BILATERAL Bilateral 2013    There were no vitals filed for this visit.  Subjective Assessment - 08/11/17 0950    Subjective  Some fatigue due to shoveling snow    Pertinent History  OA; TIA.    Limitations  Sitting    How long can you sit comfortably?  20 minutes.    Patient Stated Goals  Move  better.    Currently in Pain?  No/denies                      [facility] Adult PT Treatment/Exercise - 08/11/17 0001      Neck Exercises: Standing   Wall Push Ups  20 reps    Other Standing Exercises  cervical isometrics back side to side x10 each standing with grey ball      Lumbar Exercises: Aerobic   Stationary Bike  Nustep level 7 x 16 min (1000 steps)    UBE (Upper Arm Bike)   standing 3 minutes forward and 3 minutes backward (6 minutes total).      Knee/Hip Exercises: Standing   Rocker Board  5 minutes    Other Standing Knee Exercises  standing pink XTS for scap retraction and latt pull x30 each      Shoulder Exercises: Standing   Other Standing Exercises  red swiss ball behind back with "W" UE  2x10                  PT Long Term Goals - 07/21/17 5361      PT LONG TERM GOAL #1   Title  Bilateral active cervical rotation= 60-65. degrees so he can  turn his head more easiliy while driving.    Time  8    Period  Weeks    Status  On-going      PT LONG TERM GOAL #2   Title  Sleep 6 hours undisturbed.    Time  8    Period  Weeks    Status  Achieved      PT LONG TERM GOAL #3   Title  Perform ADL's with pain not > 3/10.    Time  8    Period  Weeks    Status  On-going      PT LONG TERM GOAL #4   Title  Eliminate headaches.    Time  8    Period  Weeks    Status  Achieved            Plan - 08/11/17 1020    Clinical Impression Statement  Patient tolerated treatment well today. Patient reported some fatigue due to shoveling snow. Patient has some ongoing posture deficts and ROM limitations. Remaining goals ongoing.     Rehab Potential  Good    PT Frequency  2x / week    PT Duration  8 weeks    PT Treatment/Interventions  ADLs/Self Care Home Management;Cryotherapy;Electrical Stimulation;Moist Heat;Ultrasound;Therapeutic activities;Therapeutic exercise;Patient/family education;Passive range of motion;Manual techniques;Dry needling    PT Next  Visit Plan  cont with POC for Comprehensive strengthening/corner stretch/prone press/ core exercises; balance and gait activities. and C-spine per MD    Consulted and Agree with Plan of Care  Patient       Patient will benefit from skilled therapeutic intervention in order to improve the following deficits and impairments:  Pain, Decreased activity tolerance, Decreased range of motion, Postural dysfunction, Decreased strength  Visit Diagnosis: Pain in thoracic spine  Abnormal posture  Cervicalgia     Problem List Patient Active Problem List   Diagnosis Date Noted  . Ankylosing spondylitis of cervicothoracic region ([place]) 10/16/2016  . TIA (transient ischemic attack) 08/10/2015  . Carotid stenosis 08/04/2014  . Hyperlipidemia 02/21/2012  . BENIGN PROSTATIC HYPERTROPHY, WITH OBSTRUCTION 01/15/2010  . Essential hypertension 11/10/2009  . GERD 02/22/2008  . NEPHROLITHIASIS, HX OF 11/17/2007    Phillips Climes, PTA 08/11/2017, 10:32 AM  [facility] [place], [place], [address] Phone: [phone]   Fax:  [phone]  Name: [name] MRN: [id number] Date of Birth: 1936-02-21

## 2017-08-14 ENCOUNTER — Ambulatory Visit: Payer: Medicare Other | Admitting: Physical Therapy

## 2017-08-14 ENCOUNTER — Encounter: Payer: Self-pay | Admitting: Physical Therapy

## 2017-08-14 DIAGNOSIS — R293 Abnormal posture: Secondary | ICD-10-CM | POA: Diagnosis not present

## 2017-08-14 DIAGNOSIS — M542 Cervicalgia: Secondary | ICD-10-CM

## 2017-08-14 DIAGNOSIS — M546 Pain in thoracic spine: Secondary | ICD-10-CM

## 2017-08-14 NOTE — Therapy (Signed)
[facility] [place], [place], [address] Phone: [phone]   Fax:  [phone]  Physical Therapy Treatment  Patient Details  Name: [name] MRN: [id number] Date of Birth: 1936-07-08 Referring Provider: Gavin Pound, MD   Encounter Date: 08/14/2017  PT End of Session - 08/14/17 1029    Visit Number  15    Number of Visits  24    Date for PT Re-Evaluation  09/04/17    Authorization Type  G-codes every 10th visit, KX modifier at visit 15    PT Start Time  0951    PT Stop Time  1032    PT Time Calculation (min)  41 min    Activity Tolerance  Patient tolerated treatment well    Behavior During Therapy  [facility] for tasks assessed/performed       Past Medical History:  Diagnosis Date  . Ankylosing spondylitis ([place]) dx'd ~ 1974  . Arthritis    "back" (08/10/2015)  . BENIGN PROSTATIC HYPERTROPHY, WITH OBSTRUCTION 01/15/2010  . Bleeding duodenal ulcer   . Bleeding esophageal ulcer   . Bleeding stomach ulcer   . GERD 02/22/2008  . History of blood transfusion 1988   "lost ~ 1/2 of my blood volume from multiple bleeding ulcers"  . History of hiatal hernia   . HYPERGLYCEMIA 11/18/2007  . HYPERLIPIDEMIA 02/22/2008  . HYPERTENSION, UNSPECIFIED 11/10/2009  . Kidney stones   . Osteoarthritis    c-spine  . PEPTIC ULCER DISEASE 11/17/2007  . Situational depression    "son died in [place] 2015-06-29"  . TIA (transient ischemic attack)    "not that I know of in the past; they are trying to determine if I've had one today (08/10/2015)"    Past Surgical History:  Procedure Laterality Date  . CATARACT EXTRACTION W/ INTRAOCULAR LENS  IMPLANT, BILATERAL Bilateral 2013    There were no vitals filed for this visit.  Subjective Assessment - 08/14/17 0954    Subjective  Patient reported some shoulder soreness from shoveling snow    Pertinent History  OA; TIA.    Limitations  Sitting    How long can you sit comfortably?  20 minutes.    Patient Stated Goals  Move better.    Currently in Pain?  No/denies                      [facility] Adult PT Treatment/Exercise - 08/14/17 0001      Neck Exercises: Standing   Other Standing Exercises  cervical rotation with ball for resistance L/R      Lumbar Exercises: Aerobic   Stationary Bike  Nustep level 7 x 17 min (1000 steps)    UBE (Upper Arm Bike)   standing 4 minutes forward and 4 minutes backward (6 minutes total).      Knee/Hip Exercises: Standing   Rocker Board  5 minutes    Other Standing Knee Exercises  standing pink XTS for scap retraction and latt pull x30 each      Shoulder Exercises: Pulleys   Flexion  Other (comment) 34min                  PT Long Term Goals - 07/21/17 0939      PT LONG TERM GOAL #1   Title  Bilateral active cervical rotation= 60-65. degrees so he can turn his head more easiliy while driving.    Time  8    Period  Weeks  Status  On-going      PT LONG TERM GOAL #2   Title  Sleep 6 hours undisturbed.    Time  8    Period  Weeks    Status  Achieved      PT LONG TERM GOAL #3   Title  Perform ADL's with pain not > 3/10.    Time  8    Period  Weeks    Status  On-going      PT LONG TERM GOAL #4   Title  Eliminate headaches.    Time  8    Period  Weeks    Status  Achieved            Plan - 08/14/17 1029    Clinical Impression Statement  Patient tolerated treatment well today. Patient able to complete all exercises today and progressing cervical exercises today. Patient progressing toward goals with cervical limitations.     Rehab Potential  Good    PT Frequency  2x / week    PT Duration  8 weeks    PT Treatment/Interventions  ADLs/Self Care Home Management;Cryotherapy;Electrical Stimulation;Moist Heat;Ultrasound;Therapeutic activities;Therapeutic exercise;Patient/family education;Passive range of motion;Manual techniques;Dry needling    PT Next Visit Plan  cont with POC for Comprehensive  strengthening/corner stretch/prone press/ core exercises; balance and gait activities. and C-spine per MD    Consulted and Agree with Plan of Care  Patient       Patient will benefit from skilled therapeutic intervention in order to improve the following deficits and impairments:  Pain, Decreased activity tolerance, Decreased range of motion, Postural dysfunction, Decreased strength  Visit Diagnosis: Pain in thoracic spine  Abnormal posture  Cervicalgia     Problem List Patient Active Problem List   Diagnosis Date Noted  . Ankylosing spondylitis of cervicothoracic region ([place]) 10/16/2016  . TIA (transient ischemic attack) 08/10/2015  . Carotid stenosis 08/04/2014  . Hyperlipidemia 02/21/2012  . BENIGN PROSTATIC HYPERTROPHY, WITH OBSTRUCTION 01/15/2010  . Essential hypertension 11/10/2009  . GERD 02/22/2008  . NEPHROLITHIASIS, HX OF 11/17/2007    Phillips Climes, PTA 08/14/2017, 10:32 AM  [facility] [place], [place], [address] Phone: [phone]   Fax:  [phone]  Name: [name] MRN: [id number] Date of Birth: 01-19-1936

## 2017-08-21 ENCOUNTER — Ambulatory Visit: Payer: Medicare Other | Admitting: Physical Therapy

## 2017-08-21 ENCOUNTER — Encounter: Payer: Self-pay | Admitting: Physical Therapy

## 2017-08-21 DIAGNOSIS — M546 Pain in thoracic spine: Secondary | ICD-10-CM | POA: Diagnosis not present

## 2017-08-21 DIAGNOSIS — R293 Abnormal posture: Secondary | ICD-10-CM

## 2017-08-21 DIAGNOSIS — M542 Cervicalgia: Secondary | ICD-10-CM

## 2017-08-21 NOTE — Therapy (Signed)
[facility] [place], [place], [address] Phone: [phone]   Fax:  [phone]  Physical Therapy Treatment  Patient Details  Name: [name] MRN: [id number] Date of Birth: 10-17-1935 Referring Provider: Gavin Pound, MD   Encounter Date: 08/21/2017  PT End of Session - 08/21/17 1003    Visit Number  16    Number of Visits  24    Date for PT Re-Evaluation  09/04/17    Authorization Type  G-codes every 10th visit, KX modifier at visit 15    PT Start Time  0957    PT Stop Time  1040    PT Time Calculation (min)  43 min    Activity Tolerance  Patient tolerated treatment well    Behavior During Therapy  [facility] for tasks assessed/performed       Past Medical History:  Diagnosis Date  . Ankylosing spondylitis ([place]) dx'd ~ 1974  . Arthritis    "back" (08/10/2015)  . BENIGN PROSTATIC HYPERTROPHY, WITH OBSTRUCTION 01/15/2010  . Bleeding duodenal ulcer   . Bleeding esophageal ulcer   . Bleeding stomach ulcer   . GERD 02/22/2008  . History of blood transfusion 1988   "lost ~ 1/2 of my blood volume from multiple bleeding ulcers"  . History of hiatal hernia   . HYPERGLYCEMIA 11/18/2007  . HYPERLIPIDEMIA 02/22/2008  . HYPERTENSION, UNSPECIFIED 11/10/2009  . Kidney stones   . Osteoarthritis    c-spine  . PEPTIC ULCER DISEASE 11/17/2007  . Situational depression    "son died in [place] Jun 05, 2015"  . TIA (transient ischemic attack)    "not that I know of in the past; they are trying to determine if I've had one today (08/10/2015)"    Past Surgical History:  Procedure Laterality Date  . CATARACT EXTRACTION W/ INTRAOCULAR LENS  IMPLANT, BILATERAL Bilateral 2013    There were no vitals filed for this visit.  Subjective Assessment - 08/21/17 1001    Subjective  Reports improvement overall but still has trouble finding a comfortable position for his shoulder at night to sleep.    Pertinent History  OA; TIA.    Limitations  Sitting     How long can you sit comfortably?  20 minutes.    Patient Stated Goals  Move better.    Currently in Pain?  Other (Comment) No pain assessment provided by patient upon arrival         [facility] PT Assessment - 08/21/17 0001      Assessment   Medical Diagnosis  Ankylosing Spondylilis    Next MD Visit  09/01/2016      Precautions   Precautions  None      Restrictions   Weight Bearing Restrictions  No      ROM / Strength   AROM / PROM / Strength  AROM      AROM   Overall AROM   Deficits    AROM Assessment Site  Cervical    Cervical - Right Rotation  60    Cervical - Left Rotation  38                  [facility] Adult PT Treatment/Exercise - 08/21/17 0001      Neck Exercises: Machines for Strengthening   UBE (Upper Arm Bike)  6 min at 120 RPM (3 min forward, 3 min backward)      Neck Exercises: Standing   Neck Retraction  15 reps;5 secs with pillow  Neck Exercises: Seated   Cervical Rotation  15 reps;Both with overpressure      Lumbar Exercises: Aerobic   Stationary Bike  Nustep level 8 x 15 min (825 steps)      Shoulder Exercises: Pulleys   Flexion  3 minutes      Shoulder Exercises: Stretch   Corner Stretch  3 reps;30 seconds                  PT Long Term Goals - 08/21/17 1018      PT LONG TERM GOAL #1   Title  Bilateral active cervical rotation= 60-65. degrees so he can turn his head more easiliy while driving.    Time  8    Period  Weeks    Status  Partially Met R rotation 60 deg, L rotation 38 deg 08/21/2017      PT LONG TERM GOAL #2   Title  Sleep 6 hours undisturbed.    Time  8    Period  Weeks    Status  Achieved      PT LONG TERM GOAL #3   Title  Perform ADL's with pain not > 3/10.    Time  8    Period  Weeks    Status  Partially Met Sleeping is painful still per patient report 08/21/2017      PT LONG TERM GOAL #4   Title  Eliminate headaches.    Time  8    Period  Weeks    Status  Achieved            Plan -  08/21/17 1134    Clinical Impression Statement  Patient continues to tolerate treatment well and reports improvement in function with reduced pain although more pain at night and difficulty with comfortable sleeping position. VCs and demo completed throughout treatment for posture correctionduring exercises. AROM of L cervical rotation still limited whereas R cervical rotation within goal status. Cervical rotation exercises completed in sitting and standing with overpressure to increase ROM.    Rehab Potential  Good    PT Frequency  2x / week    PT Duration  8 weeks    PT Treatment/Interventions  ADLs/Self Care Home Management;Cryotherapy;Electrical Stimulation;Moist Heat;Ultrasound;Therapeutic activities;Therapeutic exercise;Patient/family education;Passive range of motion;Manual techniques;Dry needling    PT Next Visit Plan  cont with POC for Comprehensive strengthening/corner stretch/prone press/ core exercises; balance and gait activities. and C-spine per MD    PT Home Exercise Plan  supine lying in cross position with chin tucks, open book thoracic stretch with towel roll under spine in supine position    Consulted and Agree with Plan of Care  Patient       Patient will benefit from skilled therapeutic intervention in order to improve the following deficits and impairments:  Pain, Decreased activity tolerance, Decreased range of motion, Postural dysfunction, Decreased strength  Visit Diagnosis: Pain in thoracic spine  Abnormal posture  Cervicalgia     Problem List Patient Active Problem List   Diagnosis Date Noted  . Ankylosing spondylitis of cervicothoracic region ([place]) 10/16/2016  . TIA (transient ischemic attack) 08/10/2015  . Carotid stenosis 08/04/2014  . Hyperlipidemia 02/21/2012  . BENIGN PROSTATIC HYPERTROPHY, WITH OBSTRUCTION 01/15/2010  . Essential hypertension 11/10/2009  . GERD 02/22/2008  . NEPHROLITHIASIS, HX OF 11/17/2007    Standley Brooking, PTA 08/21/2017,  11:45 AM  [facility] [address] [place], [place], [address] Phone: [phone]   Fax:  [phone]  Name: [name] MRN: [id number] Date of Birth: [birth year]

## 2017-08-28 ENCOUNTER — Ambulatory Visit: Payer: Medicare Other | Attending: Rheumatology | Admitting: Physical Therapy

## 2017-08-28 ENCOUNTER — Encounter: Payer: Self-pay | Admitting: Physical Therapy

## 2017-08-28 DIAGNOSIS — R293 Abnormal posture: Secondary | ICD-10-CM | POA: Diagnosis not present

## 2017-08-28 DIAGNOSIS — M546 Pain in thoracic spine: Secondary | ICD-10-CM

## 2017-08-28 DIAGNOSIS — M542 Cervicalgia: Secondary | ICD-10-CM | POA: Diagnosis not present

## 2017-08-28 NOTE — Therapy (Signed)
[facility] [place], [place], [address] Phone: [phone]   Fax:  [phone]  Physical Therapy Treatment  Patient Details  Name: [name] MRN: [id number] Date of Birth: 07-May-1936 Referring Provider: Gavin Pound, MD   Encounter Date: 08/28/2017  PT End of Session - 08/28/17 1041    Visit Number  17    Number of Visits  24    Date for PT Re-Evaluation  09/04/17    Authorization Type  G-codes every 10th visit, KX modifier at visit 15    PT Start Time  0945    PT Stop Time  1030    PT Time Calculation (min)  45 min    Activity Tolerance  Patient tolerated treatment well    Behavior During Therapy  [facility] for tasks assessed/performed       Past Medical History:  Diagnosis Date  . Ankylosing spondylitis ([place]) dx'd ~ 1974  . Arthritis    "back" (08/10/2015)  . BENIGN PROSTATIC HYPERTROPHY, WITH OBSTRUCTION 01/15/2010  . Bleeding duodenal ulcer   . Bleeding esophageal ulcer   . Bleeding stomach ulcer   . GERD 02/22/2008  . History of blood transfusion 1988   "lost ~ 1/2 of my blood volume from multiple bleeding ulcers"  . History of hiatal hernia   . HYPERGLYCEMIA 11/18/2007  . HYPERLIPIDEMIA 02/22/2008  . HYPERTENSION, UNSPECIFIED 11/10/2009  . Kidney stones   . Osteoarthritis    c-spine  . PEPTIC ULCER DISEASE 11/17/2007  . Situational depression    "son died in [place] 06-08-15"  . TIA (transient ischemic attack)    "not that I know of in the past; they are trying to determine if I've had one today (08/10/2015)"    Past Surgical History:  Procedure Laterality Date  . CATARACT EXTRACTION W/ INTRAOCULAR LENS  IMPLANT, BILATERAL Bilateral 2013    There were no vitals filed for this visit.  Subjective Assessment - 08/28/17 0951    Subjective  Patient reported ongoing improvement with some UE/postural and cervical limitations    Pertinent History  OA; TIA.    Limitations  Sitting    How long can you sit  comfortably?  20 minutes.    Patient Stated Goals  Move better.    Currently in Pain?  No/denies                      [facility] Adult PT Treatment/Exercise - 08/28/17 0001      Neck Exercises: Standing   Wall Push Ups  20 reps      Neck Exercises: Seated   Other Seated Exercise  cervical stretches with towel x3 each way HEP provided      Lumbar Exercises: Aerobic   Stationary Bike  Nustep level 7 x 16 min (1000 steps)    UBE (Upper Arm Bike)   standing 3 minutes forward and 3 minutes backward (6 minutes total).      Knee/Hip Exercises: Standing   Rocker Board  5 minutes    Other Standing Knee Exercises  standing pink XTS for scap retraction and latt pull x30 each      Shoulder Exercises: Pulleys   Flexion  3 minutes      Shoulder Exercises: Stretch   Corner Stretch  3 reps;30 seconds             PT Education - 08/28/17 1037    Education provided  Yes    Education Details  HEP  Person(s) Educated  Patient    Methods  Explanation;Demonstration;Handout    Comprehension  Verbalized understanding;Returned demonstration          PT Long Term Goals - 08/21/17 1018      PT LONG TERM GOAL #1   Title  Bilateral active cervical rotation= 60-65. degrees so he can turn his head more easiliy while driving.    Time  8    Period  Weeks    Status  Partially Met R rotation 60 deg, L rotation 38 deg 08/21/2017      PT LONG TERM GOAL #2   Title  Sleep 6 hours undisturbed.    Time  8    Period  Weeks    Status  Achieved      PT LONG TERM GOAL #3   Title  Perform ADL's with pain not > 3/10.    Time  8    Period  Weeks    Status  Partially Met Sleeping is painful still per patient report 08/21/2017      PT LONG TERM GOAL #4   Title  Eliminate headaches.    Time  8    Period  Weeks    Status  Achieved            Plan - 08/28/17 1042    Clinical Impression Statement  Patient tolerted treatment well today. Patient able to progress with all exercises  today. HEP provided for cervical rotation self stretches. Patient able to reach overhead to hang up coat today with some limitations. Patient would like to DC to gym program and HEP after next week.     Rehab Potential  Good    PT Frequency  2x / week    PT Duration  8 weeks    PT Treatment/Interventions  ADLs/Self Care Home Management;Cryotherapy;Electrical Stimulation;Moist Heat;Ultrasound;Therapeutic activities;Therapeutic exercise;Patient/family education;Passive range of motion;Manual techniques;Dry needling    PT Next Visit Plan  cont with POC for Comprehensive strengthening/corner stretch/prone press/ core exercises; balance and gait activities. and C-spine and may DC after next week    Consulted and Agree with Plan of Care  Patient       Patient will benefit from skilled therapeutic intervention in order to improve the following deficits and impairments:  Pain, Decreased activity tolerance, Decreased range of motion, Postural dysfunction, Decreased strength  Visit Diagnosis: Pain in thoracic spine  Abnormal posture  Cervicalgia     Problem List Patient Active Problem List   Diagnosis Date Noted  . Ankylosing spondylitis of cervicothoracic region ([place]) 10/16/2016  . TIA (transient ischemic attack) 08/10/2015  . Carotid stenosis 08/04/2014  . Hyperlipidemia 02/21/2012  . BENIGN PROSTATIC HYPERTROPHY, WITH OBSTRUCTION 01/15/2010  . Essential hypertension 11/10/2009  . GERD 02/22/2008  . NEPHROLITHIASIS, HX OF 11/17/2007    Phillips Climes, PTA 08/28/2017, 10:46 AM  [facility] [place], [place], [address] Phone: [phone]   Fax:  [phone]  Name: [name] MRN: [id number] Date of Birth: 05-24-1936

## 2017-08-28 NOTE — Patient Instructions (Signed)
  CERVICAL TOWEL ROTATION STRETCH  Hold the ends of a small folded bath towel and wrap it around your head and neck as shown. Place the towel on your face so as to minimize placing pressure on your jaw. Pressure should be placed on the side of your face/cheek bone.   Use your bottom most arm to anchor the towel in place. Use your top most arm to pull the towel to cause a gentle rotational stretch in your neck. Hold, then return to starting position and repeat.      Seated Cervical Rotation  Seated in a chair with upright posture. Turn your head toward your shoulder and add slight overpressure on your chin (as shown above) to increase the stretch.   Perform each stretch 3x with 10-20 sec holds

## 2017-09-01 DIAGNOSIS — G5601 Carpal tunnel syndrome, right upper limb: Secondary | ICD-10-CM | POA: Diagnosis not present

## 2017-09-01 DIAGNOSIS — R5383 Other fatigue: Secondary | ICD-10-CM | POA: Diagnosis not present

## 2017-09-01 DIAGNOSIS — M545 Low back pain: Secondary | ICD-10-CM | POA: Diagnosis not present

## 2017-09-01 DIAGNOSIS — M5382 Other specified dorsopathies, cervical region: Secondary | ICD-10-CM | POA: Diagnosis not present

## 2017-09-01 DIAGNOSIS — M459 Ankylosing spondylitis of unspecified sites in spine: Secondary | ICD-10-CM | POA: Diagnosis not present

## 2017-09-01 DIAGNOSIS — Z6823 Body mass index (BMI) 23.0-23.9, adult: Secondary | ICD-10-CM | POA: Diagnosis not present

## 2017-09-01 DIAGNOSIS — M436 Torticollis: Secondary | ICD-10-CM | POA: Diagnosis not present

## 2017-09-01 DIAGNOSIS — M150 Primary generalized (osteo)arthritis: Secondary | ICD-10-CM | POA: Diagnosis not present

## 2017-09-02 ENCOUNTER — Ambulatory Visit: Payer: Medicare Other | Admitting: *Deleted

## 2017-09-02 DIAGNOSIS — M546 Pain in thoracic spine: Secondary | ICD-10-CM

## 2017-09-02 DIAGNOSIS — M542 Cervicalgia: Secondary | ICD-10-CM

## 2017-09-02 DIAGNOSIS — R293 Abnormal posture: Secondary | ICD-10-CM

## 2017-09-02 NOTE — Therapy (Signed)
[facility] [place], [place], [address] Phone: [phone]   Fax:  [phone]  Physical Therapy Treatment  Patient Details  Name: [name] MRN: [id number] Date of Birth: 1936-02-06 Referring Provider: Gavin Pound, MD   Encounter Date: 09/02/2017  PT End of Session - 09/02/17 1021    Visit Number  18    Number of Visits  24    Date for PT Re-Evaluation  09/04/17    Authorization Type  G-codes every 10th visit, KX modifier at visit 15    PT Start Time  0945    PT Stop Time  1035    PT Time Calculation (min)  50 min       Past Medical History:  Diagnosis Date  . Ankylosing spondylitis ([place]) dx'd ~ 1974  . Arthritis    "back" (08/10/2015)  . BENIGN PROSTATIC HYPERTROPHY, WITH OBSTRUCTION 01/15/2010  . Bleeding duodenal ulcer   . Bleeding esophageal ulcer   . Bleeding stomach ulcer   . GERD 02/22/2008  . History of blood transfusion 1988   "lost ~ 1/2 of my blood volume from multiple bleeding ulcers"  . History of hiatal hernia   . HYPERGLYCEMIA 11/18/2007  . HYPERLIPIDEMIA 02/22/2008  . HYPERTENSION, UNSPECIFIED 11/10/2009  . Kidney stones   . Osteoarthritis    c-spine  . PEPTIC ULCER DISEASE 11/17/2007  . Situational depression    "son died in [place] 06-08-15"  . TIA (transient ischemic attack)    "not that I know of in the past; they are trying to determine if I've had one today (08/10/2015)"    Past Surgical History:  Procedure Laterality Date  . CATARACT EXTRACTION W/ INTRAOCULAR LENS  IMPLANT, BILATERAL Bilateral 2013    There were no vitals filed for this visit.  Subjective Assessment - 09/02/17 0958    Subjective  Patient reported ongoing improvement with some UE/postural and cervical limitations    Pertinent History  OA; TIA.    Limitations  Sitting    How long can you sit comfortably?  20 minutes.    Patient Stated Goals  Move better.    Pain Location  Back    Pain Orientation  Right;Left;Mid     Pain Descriptors / Indicators  Discomfort    Pain Type  Chronic pain    Pain Frequency  Intermittent                      [facility] Adult PT Treatment/Exercise - 09/02/17 0001      Exercises   Exercises  Knee/Hip      Neck Exercises: Standing   Wall Push Ups  20 reps      Neck Exercises: Seated   Cervical Rotation  15 reps;Both with overpressure ball on wall    Other Seated Exercise  cervical stretches with towel x3 each way HEP provided      Lumbar Exercises: Aerobic   Stationary Bike  Nustep level 7 x 16 min (1000 steps)    UBE (Upper Arm Bike)   standing 3 minutes forward and 3 minutes backward (6 minutes total).      Lumbar Exercises: Seated   Sit to Stand  10 reps with use of Bil. UEs      Knee/Hip Exercises: Standing   Rocker Board  5 minutes    Other Standing Knee Exercises  standing pink XTS for scap retraction and latt pull x30 each      Shoulder Exercises: Pulleys  Flexion  3 minutes                  PT Long Term Goals - 08/21/17 1018      PT LONG TERM GOAL #1   Title  Bilateral active cervical rotation= 60-65. degrees so he can turn his head more easiliy while driving.    Time  8    Period  Weeks    Status  Partially Met R rotation 60 deg, L rotation 38 deg 08/21/2017      PT LONG TERM GOAL #2   Title  Sleep 6 hours undisturbed.    Time  8    Period  Weeks    Status  Achieved      PT LONG TERM GOAL #3   Title  Perform ADL's with pain not > 3/10.    Time  8    Period  Weeks    Status  Partially Met Sleeping is painful still per patient report 08/21/2017      PT LONG TERM GOAL #4   Title  Eliminate headaches.    Time  8    Period  Weeks    Status  Achieved            Plan - 09/02/17 1022    Clinical Impression Statement  Pt arrived today doing a little better with movements/ transitions. He reports that going from sit to stand is still very challenging for him at home. This was practiced in the clinic and was given  for HEP as well to practice 30x daily. He did fairly well with other act.'s for shldr and neck stiffness and feels better after Rx.Possible DC after next visit to [facility] program.    Clinical Presentation  Evolving    Clinical Decision Making  Moderate    Rehab Potential  Good    PT Frequency  2x / week    PT Duration  8 weeks    PT Treatment/Interventions  ADLs/Self Care Home Management;Cryotherapy;Electrical Stimulation;Moist Heat;Ultrasound;Therapeutic activities;Therapeutic exercise;Patient/family education;Passive range of motion;Manual techniques;Dry needling    PT Next Visit Plan  cont with POC for Comprehensive strengthening/corner stretch/prone press/ core exercises; balance and gait activities. and C-spine and may DC after next week    PT Home Exercise Plan  supine lying in cross position with chin tucks, open book thoracic stretch with towel roll under spine in supine position    Consulted and Agree with Plan of Care  Patient       Patient will benefit from skilled therapeutic intervention in order to improve the following deficits and impairments:  Pain, Decreased activity tolerance, Decreased range of motion, Postural dysfunction, Decreased strength  Visit Diagnosis: Pain in thoracic spine  Abnormal posture  Cervicalgia     Problem List Patient Active Problem List   Diagnosis Date Noted  . Ankylosing spondylitis of cervicothoracic region ([place]) 10/16/2016  . TIA (transient ischemic attack) 08/10/2015  . Carotid stenosis 08/04/2014  . Hyperlipidemia 02/21/2012  . BENIGN PROSTATIC HYPERTROPHY, WITH OBSTRUCTION 01/15/2010  . Essential hypertension 11/10/2009  . GERD 02/22/2008  . NEPHROLITHIASIS, HX OF 11/17/2007    RAMSEUR,CHRIS, PTA 09/02/2017, 11:49 AM  [facility] [place], [place], [address] Phone: [phone]   Fax:  [phone]  Name: [name] MRN: [id number] Date of Birth: 08-08-1936

## 2017-09-04 ENCOUNTER — Ambulatory Visit: Payer: Medicare Other | Admitting: Physical Therapy

## 2017-09-04 ENCOUNTER — Encounter: Payer: Self-pay | Admitting: Physical Therapy

## 2017-09-04 DIAGNOSIS — R293 Abnormal posture: Secondary | ICD-10-CM

## 2017-09-04 DIAGNOSIS — M546 Pain in thoracic spine: Secondary | ICD-10-CM

## 2017-09-04 DIAGNOSIS — M542 Cervicalgia: Secondary | ICD-10-CM | POA: Diagnosis not present

## 2017-09-04 NOTE — Therapy (Addendum)
[facility] [place], [place], [address] Phone: [phone]   Fax:  [phone]  Physical Therapy Treatment  Patient Details  Name: [name] MRN: [id number] Date of Birth: 09/09/1935 Referring Provider: Gavin Pound, MD   Encounter Date: 09/04/2017  PT End of Session - 09/04/17 0950    Visit Number  19    Number of Visits  24    Date for PT Re-Evaluation  09/04/17    Authorization Type  G-codes every 10th visit, KX modifier at visit 15    PT Start Time  0949    PT Stop Time  1033    PT Time Calculation (min)  44 min    Activity Tolerance  Patient tolerated treatment well    Behavior During Therapy  [facility] for tasks assessed/performed       Past Medical History:  Diagnosis Date  . Ankylosing spondylitis ([place]) dx'd ~ 1974  . Arthritis    "back" (08/10/2015)  . BENIGN PROSTATIC HYPERTROPHY, WITH OBSTRUCTION 01/15/2010  . Bleeding duodenal ulcer   . Bleeding esophageal ulcer   . Bleeding stomach ulcer   . GERD 02/22/2008  . History of blood transfusion 1988   "lost ~ 1/2 of my blood volume from multiple bleeding ulcers"  . History of hiatal hernia   . HYPERGLYCEMIA 11/18/2007  . HYPERLIPIDEMIA 02/22/2008  . HYPERTENSION, UNSPECIFIED 11/10/2009  . Kidney stones   . Osteoarthritis    c-spine  . PEPTIC ULCER DISEASE 11/17/2007  . Situational depression    "son died in [place] 06/04/15"  . TIA (transient ischemic attack)    "not that I know of in the past; they are trying to determine if I've had one today (08/10/2015)"    Past Surgical History:  Procedure Laterality Date  . CATARACT EXTRACTION W/ INTRAOCULAR LENS  IMPLANT, BILATERAL Bilateral 2013    There were no vitals filed for this visit.  Subjective Assessment - 09/04/17 0949    Subjective  Reports continued limitation with sleeping at night as well as cervical extension and B rotations. "I will start the gym program next week."    Pertinent History  OA; TIA.     Limitations  Sitting    How long can you sit comfortably?  20 minutes.    Patient Stated Goals  Move better.    Currently in Pain?  No/denies         [facility] PT Assessment - 09/04/17 0001      Assessment   Medical Diagnosis  Ankylosing Spondylilis      Precautions   Precautions  None      Restrictions   Weight Bearing Restrictions  No      ROM / Strength   AROM / PROM / Strength  AROM      AROM   AROM Assessment Site  Cervical    Cervical - Right Rotation  60    Cervical - Left Rotation  45                  [facility] Adult PT Treatment/Exercise - 09/04/17 0001      Neck Exercises: Standing   Upper Extremity D2  Flexion;10 reps;Theraband    Theraband Level (UE D2)  Level 2 (Red)      Lumbar Exercises: Aerobic   Stationary Bike  Nustep level 7 x 16 min (1000 steps)    UBE (Upper Arm Bike)   standing 3 minutes forward and 3 minutes backward (6 minutes total).  Lumbar Exercises: Seated   Sit to Stand  15 reps with UE support      Shoulder Exercises: Standing   Extension  Strengthening;Both;20 reps;Limitations    Extension Limitations  Pink XTS      Shoulder Exercises: Pulleys   Flexion  Other (comment) x5 min                  PT Long Term Goals - 09/04/17 1024      PT LONG TERM GOAL #1   Title  Bilateral active cervical rotation= 60-65. degrees so he can turn his head more easiliy while driving.    Time  8    Period  Weeks    Status  Partially Met R rotation 60 deg, L rotation 45 deg 09/04/2017      PT LONG TERM GOAL #2   Title  Sleep 6 hours undisturbed.    Time  8    Period  Weeks    Status  Achieved      PT LONG TERM GOAL #3   Title  Perform ADL's with pain not > 3/10.    Time  8    Period  Weeks    Status  Achieved Still has pain at night 09/04/2017      PT LONG TERM GOAL #4   Title  Eliminate headaches.    Time  8    Period  Weeks    Status  Achieved            Plan - 09/04/17 1042    Clinical Impression  Statement  Patient arrived to clinic for discharge from PT and progression to facility's self directed gym program. Patient able to complete exercises as directed with emphasis on UE mobilty and back strengthening for erect stance. Patient limited with cervical extension as well as B cervical rotation but able to achieve all other goals. Final FOTO score of 44%.    Rehab Potential  Good    PT Frequency  2x / week    PT Duration  8 weeks    PT Treatment/Interventions  ADLs/Self Care Home Management;Cryotherapy;Electrical Stimulation;Moist Heat;Ultrasound;Therapeutic activities;Therapeutic exercise;Patient/family education;Passive range of motion;Manual techniques;Dry needling    PT Next Visit Plan  D/C summary required.    PT Home Exercise Plan  supine lying in cross position with chin tucks, open book thoracic stretch with towel roll under spine in supine position    Consulted and Agree with Plan of Care  Patient       Patient will benefit from skilled therapeutic intervention in order to improve the following deficits and impairments:  Pain, Decreased activity tolerance, Decreased range of motion, Postural dysfunction, Decreased strength  Visit Diagnosis: Pain in thoracic spine  Abnormal posture     Problem List Patient Active Problem List   Diagnosis Date Noted  . Ankylosing spondylitis of cervicothoracic region ([place]) 10/16/2016  . TIA (transient ischemic attack) 08/10/2015  . Carotid stenosis 08/04/2014  . Hyperlipidemia 02/21/2012  . BENIGN PROSTATIC HYPERTROPHY, WITH OBSTRUCTION 01/15/2010  . Essential hypertension 11/10/2009  . GERD 02/22/2008  . NEPHROLITHIASIS, HX OF 11/17/2007   Standley Brooking, PTA 09/04/17 10:50 AM   [facility] [address] [place], [place], [address] Phone: [phone]   Fax:  [phone]  Name: [name] MRN: [id number] Date of Birth: [birth year]  PHYSICAL THERAPY DISCHARGE SUMMARY  Visits from  Start of Care: 19.  Current functional level related to goals / functional outcomes: See above.  Remaining deficits: All but goal #1 not met and it was partially met.   Education / Equipment: HEP. Plan: Patient agrees to discharge.  Patient goals were partially met. Patient is being discharged due to being pleased with the current functional level.  ?????         [name] Applegate MPT

## 2017-09-09 ENCOUNTER — Ambulatory Visit: Payer: Self-pay | Admitting: *Deleted

## 2017-09-09 ENCOUNTER — Encounter (HOSPITAL_COMMUNITY): Payer: Self-pay | Admitting: Emergency Medicine

## 2017-09-09 ENCOUNTER — Ambulatory Visit (INDEPENDENT_AMBULATORY_CARE_PROVIDER_SITE_OTHER): Payer: Medicare Other

## 2017-09-09 ENCOUNTER — Ambulatory Visit (HOSPITAL_COMMUNITY)
Admission: EM | Admit: 2017-09-09 | Discharge: 2017-09-09 | Disposition: A | Discharge status: Home / Self Care | Payer: Medicare Other | Attending: Family Medicine | Admitting: Family Medicine

## 2017-09-09 ENCOUNTER — Encounter: Payer: Self-pay | Admitting: *Deleted

## 2017-09-09 ENCOUNTER — Telehealth: Payer: Self-pay | Admitting: Internal Medicine

## 2017-09-09 DIAGNOSIS — M25511 Pain in right shoulder: Secondary | ICD-10-CM | POA: Diagnosis not present

## 2017-09-09 DIAGNOSIS — M25512 Pain in left shoulder: Secondary | ICD-10-CM

## 2017-09-09 DIAGNOSIS — S4991XA Unspecified injury of right shoulder and upper arm, initial encounter: Secondary | ICD-10-CM | POA: Diagnosis not present

## 2017-09-09 DIAGNOSIS — M25551 Pain in right hip: Secondary | ICD-10-CM | POA: Diagnosis not present

## 2017-09-09 DIAGNOSIS — S0990XA Unspecified injury of head, initial encounter: Secondary | ICD-10-CM

## 2017-09-09 DIAGNOSIS — W1800XA Striking against unspecified object with subsequent fall, initial encounter: Secondary | ICD-10-CM

## 2017-09-09 DIAGNOSIS — S4992XA Unspecified injury of left shoulder and upper arm, initial encounter: Secondary | ICD-10-CM | POA: Diagnosis not present

## 2017-09-09 NOTE — Telephone Encounter (Signed)
I spoke with [facility] they were going to advise ER or Urgent care.

## 2017-09-09 NOTE — Telephone Encounter (Signed)
This encounter was created in error - please disregard.

## 2017-09-09 NOTE — Discharge Instructions (Signed)
You may use over the counter ibuprofen or acetaminophen as needed.  Follow up with your primary care doctor or here within 48-72 hours. Do your best to ensure adequate rest. Take acetaminophen (Tylenol) every 4-6 hours as needed for pain. Often individuals will develop a headache associated with mild nausea in the days or hours after a head injury. This is called a concussion and may require further follow up.  Please seek prompt medical care if: You have: A very bad (severe) headache that is not helped by medicine. Trouble walking or weakness in your arms and legs. Clear or bloody fluid coming from your nose or ears. Changes in your seeing (vision). Jerky movements that you cannot control (seizure). You throw up (vomit). Your symptoms get worse. You lose balance. Your speech is slurred. You pass out. You are sleepier and have trouble staying awake. The black centers of your eyes (pupils) change in size.  These symptoms may be an emergency. Do not wait to see if the symptoms will go away. Get medical help right away. Call your local emergency services. Do not drive yourself to the hospital.

## 2017-09-09 NOTE — Telephone Encounter (Signed)
Pt   [name]   While   Walking    And  Putting  On his  Coat     Hit  Head   On  Door  Frame  May  Have  Hit  Floor  As   Well     - He  Also   Reports  Injuring  His  Lower tailbone   As   Well he  Is  Ambulatory   He  Was  At  Physical  Therapy    Today .    He  Is awake   And   Alert  And  Oriented   No   Availability  At  PCP   Today       Reason for Disposition . Dangerous injury (e.g., MVA, diving, trampoline, contact sports, fall > 10 feet or 3 meters) or severe blow from hard object (e.g., golf club or baseball bat)  Answer Assessment - Initial Assessment Questions 1. MECHANISM: "How did the injury happen?" For falls, ask: "What height did you fall from?" and "What surface did you fall against?"       Pt  While  Putting  On jacket  And walking   Fell  From  Standing  posistion         Head  May have  Hit   Carpeted  Floor   Also   Hit tailbone   On    Carpeted        Surface   l   Shoulder  Was  Jarred       2. ONSET: "When did the injury happen?" (Minutes or hours ago)        approx  1  Hour  Ago     3. NEUROLOGIC SYMPTOMS: "Was there any loss of consciousness?" "Are there any other neurological symptoms?"          Does  Not  Think  So    4. MENTAL STATUS: "Does the person know who he is, who you are, and where he is?"           Awake  And  Alert      5. LOCATION: "What part of the head was hit?"            Back  Of  Head    6. SCALP APPEARANCE: "What does the scalp look like? Is it bleeding now?" If so, ask: "Is it difficult to stop?"             No  Obvious    Injury   Red   Spot  At  [facility] of skull    7. SIZE: For cuts, bruises, or swelling, ask: "How large is it?" (e.g., inches or centimeters)                Nothing   8. PAIN: "Is there any pain?" If so, ask: "How bad is it?"  (e.g., Scale 1-10; or mild, moderate, severe)               Moderate pain  9. TETANUS: For any breaks in the skin, ask: "When was the last tetanus booster?"                  Thinks    Within  5  Years   10.  OTHER SYMPTOMS: "Do you have any other symptoms?" (e.g., neck pain, vomiting)               No  11. PREGNANCY: "Is there any chance you are pregnant?" "When was your last menstrual period?"      n/a  Protocols used: HEAD INJURY-A-AH

## 2017-09-09 NOTE — ED Provider Notes (Signed)
[facility]   [id number] 09/09/17 Arrival Time: 1740  ASSESSMENT & PLAN:  1. Acute pain of left shoulder   2. Fall against object   3. Right hip pain   4. Injury of head, initial encounter     Imaging: Dg Shoulder Left  Result Date: 09/09/2017 CLINICAL DATA:  Patient states that he lost balance today and fell backwards injuring his left shoulder and his rt hip. Hx of ankylosis spondylosis. EXAM: LEFT SHOULDER - 2+ VIEW COMPARISON:  None. FINDINGS: No fracture.  No bone lesion.  Bones are demineralized. The glenohumeral and AC joints are normally spaced and aligned. No significant degenerative/arthropathic change. Soft tissues are unremarkable. IMPRESSION: No fracture or dislocation. Electronically Signed   By: Lajean Manes M.D.   On: 09/09/2017 15:47   Dg Hip Unilat With Pelvis 2-3 Views Right  Result Date: 09/09/2017 CLINICAL DATA:  Patient states that he lost balance today and fell backwards injuring his left shoulder and his rt hip. Hx of ankylosis spondylosis. EXAM: DG HIP (WITH OR WITHOUT PELVIS) 2-3V RIGHT COMPARISON:  None. FINDINGS: No fracture.  No bone lesion. The hip joints, SI joints and symphysis pubis are normally spaced and aligned. No significant arthropathic change. There is bony bridging across the superior margin of the symphysis pubis. Soft tissues are unremarkable. IMPRESSION: No fracture, dislocation or acute finding. Electronically Signed   By: Lajean Manes M.D.   On: 09/09/2017 15:48   Prefers OTC analgesics as needed.  Written head injury precautions given. Will f/u with his PCP if not showing improvement over the next several days.  Reviewed expectations re: course of current medical issues. Questions answered. Outlined signs and symptoms indicating need for more acute intervention. Patient verbalized understanding. After Visit Summary given.  SUBJECTIVE: History from: patient.  [name] is a 82 y.o. male who reports:  Pain/Discomfort  of: left shoulder and right hip Frequency: fairly persistent in both areas Onset gradual, approximately several hours ago. Injury/trama: yes, reports falling at his PT office; "fell straight on my backside"; questions if shoulder hit wall; thinks back of head hit wall; no LOC; no neck pain Describes as: L shoulder "dull ache" without radiation; "back of head a little sore" Severity: mild Progression: no worsening Relieved by: nothing in particular Worsened by: movement Associated symptoms: no n/v/visual changes/headache Extremity sensation changes or weakness: none. Self treatment: has not tried OTCs for relief of pain.  History of similar: no  ROS: As per HPI. All other systems negative.   OBJECTIVE:  Vitals:   09/09/17 1504 09/09/17 1507  BP:  (!) 184/63  Pulse:  80  Resp:  16  Temp:  98 F (36.7 C)  TempSrc:  Oral  SpO2:  100%  Weight: 145 lb (65.8 kg)     General appearance: alert; no distress Neck: supple Extremities: no cyanosis or edema; symmetrical with no gross deformities; generalized tenderness over his left shoulder with no swelling and no bruising; ROM: limited by pain; LE with FROM R Hip: FROM; no bony tenderness; more tender towards buttock on R CV: normal extremity capillary refill Skin: warm and dry Neurologic: CN 2-12 grossly intact; normal gait; normal symmetric reflexes in all extremities; normal sensation in all extremities Psychological: alert and cooperative; normal mood and affect  No Known Allergies  Past Medical History:  Diagnosis Date  . Ankylosing spondylitis ([place]) dx'd ~ 1974  . Arthritis    "back" (08/10/2015)  . BENIGN PROSTATIC HYPERTROPHY, WITH OBSTRUCTION 01/15/2010  . Bleeding  duodenal ulcer   . Bleeding esophageal ulcer   . Bleeding stomach ulcer   . GERD 02/22/2008  . History of blood transfusion 1988   "lost ~ 1/2 of my blood volume from multiple bleeding ulcers"  . History of hiatal hernia   . HYPERGLYCEMIA 11/18/2007  .  HYPERLIPIDEMIA 02/22/2008  . HYPERTENSION, UNSPECIFIED 11/10/2009  . Kidney stones   . Osteoarthritis    c-spine  . PEPTIC ULCER DISEASE 11/17/2007  . Situational depression    "son died in [place] 07-13-2015"  . TIA (transient ischemic attack)    "not that I know of in the past; they are trying to determine if I've had one today (08/10/2015)"   Social History   Socioeconomic History  . Marital status: Married    Spouse name: Not on file  . Number of children: Not on file  . Years of education: Not on file  . Highest education level: Not on file  Social Needs  . Financial resource strain: Not on file  . Food insecurity - worry: Not on file  . Food insecurity - inability: Not on file  . Transportation needs - medical: Not on file  . Transportation needs - non-medical: Not on file  Occupational History  . Not on file  Tobacco Use  . Smoking status: Former Smoker    Years: 15.00    Types: Cigars, Pipe    Last attempt to quit: 08/26/1978    Years since quitting: 39.0  . Smokeless tobacco: Never Used  Substance and Sexual Activity  . Alcohol use: Yes    Comment: 08/10/2015 "glass of wine ~ month or so; occasionally a can of beer"  . Drug use: No  . Sexual activity: Yes  Other Topics Concern  . Not on file  Social History Narrative   Retired and married.    Family History  Problem Relation Age of Onset  . Appendicitis Mother        complications  . Colon cancer Father   . Diabetes type II Father   . Lupus Sister   . Colon cancer Other        1st degree relative <60   Past Surgical History:  Procedure Laterality Date  . CATARACT EXTRACTION W/ INTRAOCULAR LENS  IMPLANT, BILATERAL Bilateral 2013      Vanessa Kick, MD 09/13/17 1210

## 2017-09-09 NOTE — Telephone Encounter (Signed)
Patient has arrived in the ER now.

## 2017-09-09 NOTE — Telephone Encounter (Signed)
I spoke with [facility], they are going to advise Urgent Care.

## 2017-09-09 NOTE — Telephone Encounter (Signed)
Monitor for ER or Urgent care arrival.

## 2017-09-09 NOTE — Telephone Encounter (Signed)
Pt fell in P.T..According to therapist pt lost footing and fell backwards, hit head, tailbone and lt shoulder. No LOC, no dizziness r/t fall.  Reports 5/10 pain at shoulder "comes and goes." Tailbone area discomfort, unable to rate or describe. No open areas, bumps, no abrasions. B/P 166/65. Therapist asking if pt should be seen at practice. Note routed high priority to provider.

## 2017-09-09 NOTE — ED Triage Notes (Signed)
PT reports he has been in rehab for a while. PT just started "gym sessions" today. PT used a treadmill earlier. While trying to put on his jacket, he fell to the floor. PT fell on his bottom and believes he hit a door jam with the back of his head. PT reports left shoulder pain as well.   PT reports tailbone pain and hip pain as well.   PT is taking 81 mg ASA per day. PT did not lose consciousness.

## 2017-09-22 ENCOUNTER — Ambulatory Visit (HOSPITAL_COMMUNITY): Payer: Medicare Other | Attending: Rheumatology

## 2017-09-22 ENCOUNTER — Other Ambulatory Visit: Payer: Self-pay

## 2017-09-22 DIAGNOSIS — M25612 Stiffness of left shoulder, not elsewhere classified: Secondary | ICD-10-CM | POA: Insufficient documentation

## 2017-09-22 DIAGNOSIS — R29898 Other symptoms and signs involving the musculoskeletal system: Secondary | ICD-10-CM | POA: Insufficient documentation

## 2017-09-22 DIAGNOSIS — R278 Other lack of coordination: Secondary | ICD-10-CM

## 2017-09-22 DIAGNOSIS — M542 Cervicalgia: Secondary | ICD-10-CM | POA: Insufficient documentation

## 2017-09-22 DIAGNOSIS — M25611 Stiffness of right shoulder, not elsewhere classified: Secondary | ICD-10-CM

## 2017-09-22 NOTE — Progress Notes (Signed)
CARDIOLOGY OFFICE NOTE  Date:  09/22/2017    [name] Date of Birth: 28-Apr-1936 Medical Record #[id number]  PCP:  Marletta Lor, MD  Cardiologist:  Peter Martinique MD   No chief complaint on file.   History of Present Illness: [name] is a 82 y.o. male who presents today for cardiac follow up.   He has a history of hyperlipidemia, HTN, and GERD. He has no known CAD. Negative stress echo in 2011. Does have known carotid disease -  study in 07/2014 with 40 to 59% RICA stenosis. Admitted in December 2016 with possible TIA. Echo was unremarkable. Carotid dopplers then showed only mild disease.  On follow up today he states he is doing OK. Reports BP typically in 140s.   He denies any chest pain, SOB, or palpitations. He walks in the park and goes to [place] in [place].  His biggest problem is ankylosing spondylitis and he is seeing a Rheumatologist.  He enjoys traveling in his RV.    Past Medical History:  Diagnosis Date  . Ankylosing spondylitis ([place]) dx'd ~ 1974  . Arthritis    "back" (08/10/2015)  . BENIGN PROSTATIC HYPERTROPHY, WITH OBSTRUCTION 01/15/2010  . Bleeding duodenal ulcer   . Bleeding esophageal ulcer   . Bleeding stomach ulcer   . GERD 02/22/2008  . History of blood transfusion 1988   "lost ~ 1/2 of my blood volume from multiple bleeding ulcers"  . History of hiatal hernia   . HYPERGLYCEMIA 11/18/2007  . HYPERLIPIDEMIA 02/22/2008  . HYPERTENSION, UNSPECIFIED 11/10/2009  . Kidney stones   . Osteoarthritis    c-spine  . PEPTIC ULCER DISEASE 11/17/2007  . Situational depression    "son died in [place] 14-Jul-2015"  . TIA (transient ischemic attack)    "not that I know of in the past; they are trying to determine if I've had one today (08/10/2015)"    Past Surgical History:  Procedure Laterality Date  . CATARACT EXTRACTION W/ INTRAOCULAR LENS  IMPLANT, BILATERAL Bilateral 2013     Medications: Current Outpatient Medications  Medication Sig Dispense  Refill  . aspirin 81 MG tablet Take 81 mg by mouth daily.      Marland Kitchen atorvastatin (LIPITOR) 10 MG tablet Take 1 tablet (10 mg total) by mouth daily. 90 tablet 3  . atorvastatin (LIPITOR) 10 MG tablet TAKE 1 TABLET BY MOUTH  DAILY 90 tablet 1  . Cholecalciferol (VITAMIN D PO) Take 1,000 Units by mouth daily.    . famotidine (PEPCID) 20 MG tablet Take 20 mg by mouth 2 (two) times daily.     . fish oil-omega-3 fatty acids 1000 MG capsule Take 1 g by mouth daily.     Marland Kitchen lisinopril-hydrochlorothiazide (PRINZIDE,ZESTORETIC) 20-12.5 MG tablet Take 0.5 tablets by mouth daily. 90 tablet 3  . meloxicam (MOBIC) 15 MG tablet TAKE 1 TABLET BY MOUTH  DAILY 90 tablet 0  . Multiple Vitamin (MULTIVITAMIN WITH MINERALS) TABS tablet Take 1 tablet by mouth daily.    . nitroGLYCERIN (NITROSTAT) 0.4 MG SL tablet Place 1 tablet (0.4 mg total) under the tongue every 5 (five) minutes as needed. (Patient taking differently: Place 0.4 mg under the tongue every 5 (five) minutes as needed for chest pain. ) 25 tablet 6  . tamsulosin (FLOMAX) 0.4 MG CAPS capsule TAKE 1 CAPSULE BY MOUTH  DAILY 90 capsule 3   No current facility-administered medications for this visit.     Allergies: No Known Allergies  Social History:  The patient  reports that he quit smoking about 39 years ago. His smoking use included cigars and pipe. He quit after 15.00 years of use. he has never used smokeless tobacco. He reports that he drinks alcohol. He reports that he does not use drugs.   Family History: The patient's family history includes Appendicitis in his mother; Colon cancer in his father and other; Diabetes type II in his father; Lupus in his sister.   Review of Systems: Please see the history of present illness.   All other systems are reviewed and negative.   Physical Exam: VS:  There were no vitals taken for this visit. Marland Kitchen  BMI There is no height or weight on file to calculate BMI.  Wt Readings from Last 3 Encounters:  09/09/17 145 lb  (65.8 kg)  10/31/16 150 lb 12.8 oz (68.4 kg)  10/21/16 148 lb 8 oz (67.4 kg)   GENERAL:  Well appearing WM in NAD HEENT:  PERRL, EOMI, sclera are clear. Oropharynx is clear. NECK:  No jugular venous distention, carotid upstroke brisk and symmetric, no bruits, no thyromegaly or adenopathy LUNGS:  Clear to auscultation bilaterally CHEST:  Unremarkable HEART:  RRR,  PMI not displaced or sustained,S1 and S2 within normal limits, no S3, no S4: no clicks, no rubs, no murmurs ABD:  Soft, nontender. BS +, no masses or bruits. No hepatomegaly, no splenomegaly EXT:  2 + pulses throughout, no edema, no cyanosis no clubbing SKIN:  Warm and dry.  No rashes NEURO:  Alert and oriented x 3. Cranial nerves II through XII intact. PSYCH:  Cognitively intact      LABORATORY DATA:  EKG:  EKG is  ordered today. It shows NSR rate 82. Normal Ecg. I have personally reviewed and interpreted this study.   Lab Results  Component Value Date   WBC 9.1 10/21/2016   HGB 13.6 10/21/2016   HCT 40.3 10/21/2016   PLT 221 10/21/2016   GLUCOSE 92 10/21/2016   CHOL 126 10/21/2016   TRIG 38 10/21/2016   HDL 79 10/21/2016   LDLCALC 39 10/21/2016   ALT 18 10/21/2016   AST 18 10/21/2016   NA 138 10/21/2016   K 4.4 10/21/2016   CL 101 10/21/2016   CREATININE 1.19 (H) 10/21/2016   BUN 23 10/21/2016   CO2 26 10/21/2016   TSH 6.699 (H) 08/11/2015   PSA 0.38 01/15/2010   INR 0.9 11/03/2007   HGBA1C 5.9 (H) 08/11/2015   Dated 05/29/17: creatinine 1.17. Hgb 12.4.   BNP (last 3 results) No results for input(s): BNP in the last 8760 hours.  ProBNP (last 3 results) No results for input(s): PROBNP in the last 8760 hours.   Other Studies Reviewed Today: Carotid dopplers 12/03/16: 1-39% bilateral carotid disease.  Assessment/Plan: 1. Carotid stenosis: mild by doppler in 2018.   2. Hyperlipidemia: on statin therapy. Excellent control last year. Scheduled for lab work next month with primary care.   3. HTN: BP  is elevated today. Will monitor readings at home. If still elevated when he sees PCP next month may need to adjust meds.  4.. Ankylosing spondylitis  Current medicines are reviewed with the patient today.  The patient does not have concerns regarding medicines other than what has been noted above.  The following changes have been made:  See above.  Labs/ tests ordered today include:    No orders of the defined types were placed in this encounter.    Signed: Peter Martinique MD, [facility]  09/22/2017  7:38 AM  [facility]

## 2017-09-22 NOTE — Patient Instructions (Signed)

## 2017-09-23 ENCOUNTER — Encounter (HOSPITAL_COMMUNITY): Payer: Self-pay

## 2017-09-23 NOTE — Therapy (Signed)
[facility] [address] [place], [place], [address] Phone: [phone]   Fax:  [phone]  Occupational Therapy Evaluation  Patient Details  Name: [name] MRN: [id number] Date of Birth: 02-16-1936 Referring Provider: Gavin Pound, MD   Encounter Date: 09/22/2017  OT End of Session - 09/23/17 0939    Visit Number  1    Number of Visits  1    Authorization Type  1) Medicare 2) AARP    OT Start Time  6270    OT Stop Time  1718    OT Time Calculation (min)  73 min    Activity Tolerance  Patient tolerated treatment well    Behavior During Therapy  [facility] for tasks assessed/performed       Past Medical History:  Diagnosis Date  . Ankylosing spondylitis ([place]) dx'd ~ 1974  . Arthritis    "back" (08/10/2015)  . BENIGN PROSTATIC HYPERTROPHY, WITH OBSTRUCTION 01/15/2010  . Bleeding duodenal ulcer   . Bleeding esophageal ulcer   . Bleeding stomach ulcer   . GERD 02/22/2008  . History of blood transfusion 1988   "lost ~ 1/2 of my blood volume from multiple bleeding ulcers"  . History of hiatal hernia   . HYPERGLYCEMIA 11/18/2007  . HYPERLIPIDEMIA 02/22/2008  . HYPERTENSION, UNSPECIFIED 11/10/2009  . Kidney stones   . Osteoarthritis    c-spine  . PEPTIC ULCER DISEASE 11/17/2007  . Situational depression    "son died in [place] 06/02/2015"  . TIA (transient ischemic attack)    "not that I know of in the past; they are trying to determine if I've had one today (08/10/2015)"    Past Surgical History:  Procedure Laterality Date  . CATARACT EXTRACTION W/ INTRAOCULAR LENS  IMPLANT, BILATERAL Bilateral 2013    There were no vitals filed for this visit.  Subjective Assessment - 09/22/17 1619    Subjective   S: I'm having trouble with my buttons and getting my shirt on.    Pertinent History  Patient is a 82 y/o male S/P Ankylosing Spondylitis which is causing progressive neck weakness and and difficulty with ADL tasks. Pt had just finished PT  services at [facility] and was discharged. He met all his goals. Pt is now requesting OT services for difficulty with ADL performance. Dr. Trudie Reed has referred  patient for occupational therapy for evaluation and treatment.     Patient Stated Goals  To be cured.     Currently in Pain?  Yes    Pain Score  3     Pain Location  Shoulder    Pain Orientation  Right;Left    Pain Descriptors / Indicators  Sharp    Pain Type  Chronic pain    Pain Radiating Towards  sometimes up the neck    Pain Onset  Other (comment) less than 2 years ago    Pain Frequency  Intermittent    Aggravating Factors   certain movements, lifting    Pain Relieving Factors  rest, tylenol, heat    Effect of Pain on Daily Activities  Pt tries to push through it.    Multiple Pain Sites  No        [facility] OT Assessment - 09/22/17 1621      Assessment   Medical Diagnosis  Progressive Neck weakness and ADL difficulty    Referring Provider  Gavin Pound, MD    Onset Date/Surgical Date  -- Diagnosed 40 years ago. Experiencing issues <2 years  ago.    Hand Dominance  Right    Next MD Visit  -- 3-4 months    Prior Therapy  Pt was receiving PT for 16 sessions       Precautions   Precautions  None      Restrictions   Weight Bearing Restrictions  No      Balance Screen   Has the patient fallen in the past 6 months  Yes    How many times?  1    Has the patient had a decrease in activity level because of a fear of falling?   No    Is the patient reluctant to leave their home because of a fear of falling?   No      Home  Environment   Family/patient expects to be discharged to:  Private residence    Living Arrangements  Spouse/significant other      Prior Function   Level of [place]  Retired      ADL   ADL comments  Pt reports difficulty completing daily tasks such as donning his shirts/coats, buttons, and occassionally putting socks on.      Mobility   Mobility Status   Independent      Written Expression   Dominant Hand  Right      Vision - History   Baseline Vision  Wears glasses all the time      Cognition   Overall Cognitive Status  Within Functional Limits for tasks assessed      Posture/Postural Control   Posture/Postural Control  Postural limitations    Postural Limitations  Rounded Shoulders;Forward head;Increased lumbar lordosis;Flexed trunk      ROM / Strength   AROM / PROM / Strength  PROM;AROM      AROM   Overall AROM Comments  Assessed seated. IR/er abducted    AROM Assessment Site  Cervical;Shoulder    Right/Left Shoulder  Right;Left    Right Shoulder Flexion  120 Degrees    Right Shoulder ABduction  105 Degrees    Right Shoulder Internal Rotation  -- WFL    Right Shoulder External Rotation  -- [facility]    Left Shoulder Flexion  100 Degrees    Left Shoulder ABduction  80 Degrees    Left Shoulder Internal Rotation  -- decreased    Left Shoulder External Rotation  -- decreased    Cervical Flexion  WFL    Cervical Extension  20    Cervical - Right Side Bend  -- unable    Cervical - Left Side Bend  -- unable    Cervical - Right Rotation  60    Cervical - Left Rotation  45      PROM   Overall PROM Comments  Assessed supine. IR/er abducted    PROM Assessment Site  Shoulder    Right/Left Shoulder  Right;Left    Right Shoulder Flexion  143 Degrees    Right Shoulder ABduction  155 Degrees    Right Shoulder Internal Rotation  90 Degrees    Right Shoulder External Rotation  45 Degrees    Left Shoulder Flexion  120 Degrees    Left Shoulder ABduction  105 Degrees    Left Shoulder Internal Rotation  90 Degrees    Left Shoulder External Rotation  45 Degrees                      OT Education - 09/23/17 9355  Education provided  Yes    Education Details  Scapular strengthening with red theraband. Educated patient on various AE available including: button hook, dressing stick, sock aid, etc. Provide patient with  resources on places to purchase online and in person.     Person(s) Educated  Patient    Methods  Explanation;Demonstration;Tactile cues;Verbal cues;Handout    Comprehension  Returned demonstration;Verbalized understanding;Verbal cues required;Tactile cues required       OT Short Term Goals - 09/23/17 0948      OT SHORT TERM GOAL #1   Title  patient will be educated on available adaptive equipment available and compensatory techniques to increase functional performance during ADL tasks.    Time  1    Period  Weeks    Status  Achieved    Target Date  09/22/17      OT SHORT TERM GOAL #2   Title  Patient will be educated and independent with HEP to focus on scapular strengthening to help increase posture awareness and provide stability to cervical region.    Time  1    Period  Weeks    Status  Achieved    Target Date  09/22/17               Plan - 09/23/17 0940    Clinical Impression Statement  A: Patient is a 82 y/o male S/P ankylosing spondylitis causing increased difficulty with ADL performance due to limited joint mobility from arthritis. Patient recently was discharged from PT services from [facility] in which he met all his goals. Pt requested an OT evaluation due to difficulty with ADL performance. OT spent time going over available AE that is available to increase functional performance during ADL tasks, patient given location of places for purchase both online and in person . patient was given 3 exercises for scapular strengthening with red theraband. All education complete and patient was encouraged to continue completing neck stretches from PT as well as new scapular strengthening exercises while attending the gym in [place]. Discussed that the best thing for this condition is to continue moving for as long as possible. Pt agreed with recommendations.     Occupational Profile and client history currently impacting functional performance  Pt is motivated and eager  for education.    Occupational performance deficits (Please refer to evaluation for details):  ADL's;IADL's;Rest and Sleep    Rehab Potential  Good    Current Impairments/barriers affecting progress:  Chronic progressive condition of ankylosing spondylitis.    OT Frequency  One time visit    OT Treatment/Interventions  Patient/family education    Plan  P: One time visit for AE education and scapular strengthening HEP.    Clinical Decision Making  Several treatment options, min-mod task modification necessary    Consulted and Agree with Plan of Care  Patient       Patient will benefit from skilled therapeutic intervention in order to improve the following deficits and impairments:     Visit Diagnosis: Other symptoms and signs involving the musculoskeletal system - Plan: Ot plan of care cert/re-cert  Other lack of coordination - Plan: Ot plan of care cert/re-cert  Cervicalgia - Plan: Ot plan of care cert/re-cert  Stiffness of left shoulder, not elsewhere classified - Plan: Ot plan of care cert/re-cert  Stiffness of right shoulder, not elsewhere classified - Plan: Ot plan of care cert/re-cert    Problem List Patient Active Problem List   Diagnosis Date Noted  . Ankylosing spondylitis of  cervicothoracic region ([place]) 10/16/2016  . TIA (transient ischemic attack) 08/10/2015  . Carotid stenosis 08/04/2014  . Hyperlipidemia 02/21/2012  . BENIGN PROSTATIC HYPERTROPHY, WITH OBSTRUCTION 01/15/2010  . Essential hypertension 11/10/2009  . GERD 02/22/2008  . NEPHROLITHIASIS, HX OF 11/17/2007   Ailene Ravel, OTR/L,CBIS  [phone]  09/23/2017, 9:59 AM  [facility] [address] [place], [place], [address] Phone: [phone]   Fax:  [phone]  Name: CHRISTPHOR GROFT MRN: [id number] Date of Birth: February 05, 1936

## 2017-09-30 ENCOUNTER — Encounter: Payer: Self-pay | Admitting: Cardiology

## 2017-09-30 ENCOUNTER — Ambulatory Visit (INDEPENDENT_AMBULATORY_CARE_PROVIDER_SITE_OTHER): Payer: Medicare Other | Admitting: Cardiology

## 2017-09-30 VITALS — BP 164/58 | HR 82 | Ht 66.5 in | Wt 148.6 lb

## 2017-09-30 DIAGNOSIS — I10 Essential (primary) hypertension: Secondary | ICD-10-CM | POA: Diagnosis not present

## 2017-09-30 DIAGNOSIS — E7800 Pure hypercholesterolemia, unspecified: Secondary | ICD-10-CM | POA: Diagnosis not present

## 2017-09-30 NOTE — Patient Instructions (Addendum)
Continue your current therapy  I will see you in one year   

## 2017-10-09 ENCOUNTER — Other Ambulatory Visit (INDEPENDENT_AMBULATORY_CARE_PROVIDER_SITE_OTHER): Payer: Self-pay | Admitting: Orthopaedic Surgery

## 2017-10-22 ENCOUNTER — Ambulatory Visit (INDEPENDENT_AMBULATORY_CARE_PROVIDER_SITE_OTHER): Payer: Medicare Other | Admitting: Podiatry

## 2017-10-22 ENCOUNTER — Encounter: Payer: Self-pay | Admitting: Podiatry

## 2017-10-22 DIAGNOSIS — B351 Tinea unguium: Secondary | ICD-10-CM | POA: Diagnosis not present

## 2017-10-22 DIAGNOSIS — M79676 Pain in unspecified toe(s): Secondary | ICD-10-CM

## 2017-10-22 NOTE — Progress Notes (Addendum)
Patient ID: [name], male   DOB: 1936-07-22, 82 y.o.   MRN: [id number] Complaint:  Visit Type: Patient returns to my office for continued preventative foot care services. Complaint: Patient states" my nails have grown long and thick and become painful to walk and wear shoes" . The patient presents for preventative foot care services. No changes to ROS.  Patient is unable to self treat due to ankylosing spondylitis.  Podiatric Exam: Vascular: dorsalis pedis and posterior tibial pulses are palpable bilateral. Capillary return is immediate. Temperature gradient is WNL. Skin turgor WNL  Sensorium: Normal Semmes Weinstein monofilament test. Normal tactile sensation bilaterally. Nail Exam: Pt has thick disfigured discolored nails with subungual debris noted bilateral entire nail hallux through fifth toenails Ulcer Exam: There is no evidence of ulcer or pre-ulcerative changes or infection. Orthopedic Exam: Muscle tone and strength are WNL. No limitations in general ROM. No crepitus or effusions noted. Foot type and digits show no abnormalities. Bony prominences are unremarkable. Skin: No Porokeratosis. No infection or ulcers  Diagnosis:  Onychomycosis, , Pain in right toe, pain in left toes  Treatment & Plan Procedures and Treatment: Consent by patient was obtained for treatment procedures. The patient understood the discussion of treatment and procedures well. All questions were answered thoroughly reviewed. Debridement of mycotic and hypertrophic toenails, 1 through 5 bilateral and clearing of subungual debris. No ulceration, no infection noted.   ABN signed for 2019.                  Return Visit-Office Procedure: Patient instructed to return to the office for a follow up visit 3 months for continued evaluation and treatment.    Gardiner Barefoot DPM

## 2017-11-03 ENCOUNTER — Other Ambulatory Visit: Payer: Self-pay | Admitting: Cardiology

## 2017-11-04 ENCOUNTER — Encounter: Payer: Self-pay | Admitting: Internal Medicine

## 2017-11-04 ENCOUNTER — Ambulatory Visit (INDEPENDENT_AMBULATORY_CARE_PROVIDER_SITE_OTHER): Payer: Medicare Other | Admitting: Internal Medicine

## 2017-11-04 VITALS — BP 138/60 | HR 86 | Temp 97.9°F | Wt 151.0 lb

## 2017-11-04 DIAGNOSIS — I10 Essential (primary) hypertension: Secondary | ICD-10-CM | POA: Diagnosis not present

## 2017-11-04 DIAGNOSIS — M453 Ankylosing spondylitis of cervicothoracic region: Secondary | ICD-10-CM

## 2017-11-04 DIAGNOSIS — E782 Mixed hyperlipidemia: Secondary | ICD-10-CM

## 2017-11-04 DIAGNOSIS — I6529 Occlusion and stenosis of unspecified carotid artery: Secondary | ICD-10-CM | POA: Diagnosis not present

## 2017-11-04 LAB — COMPREHENSIVE METABOLIC PANEL
ALT: 29 U/L (ref 0–53)
AST: 23 U/L (ref 0–37)
Albumin: 4.5 g/dL (ref 3.5–5.2)
Alkaline Phosphatase: 51 U/L (ref 39–117)
BUN: 17 mg/dL (ref 6–23)
CO2: 29 mEq/L (ref 19–32)
Calcium: 10 mg/dL (ref 8.4–10.5)
Chloride: 103 mEq/L (ref 96–112)
Creatinine, Ser: 1.11 mg/dL (ref 0.40–1.50)
GFR: 67.74 mL/min (ref >60.00)
Glucose, Bld: 94 mg/dL (ref 70–99)
Potassium: 4.3 mEq/L (ref 3.5–5.1)
Sodium: 140 mEq/L (ref 135–145)
Total Bilirubin: 1.3 mg/dL — ABNORMAL HIGH (ref 0.2–1.2)
Total Protein: 6.5 g/dL (ref 6.0–8.3)

## 2017-11-04 LAB — TSH: TSH: 5.91 u[IU]/mL — ABNORMAL HIGH (ref 0.35–4.50)

## 2017-11-04 LAB — CBC WITH DIFFERENTIAL/PLATELET
Basophils Absolute: 0 10*3/uL (ref 0.0–0.1)
Basophils Relative: 0.2 % (ref 0.0–3.0)
Eosinophils Absolute: 0.1 10*3/uL (ref 0.0–0.7)
Eosinophils Relative: 1.2 % (ref 0.0–5.0)
HCT: 36 % — ABNORMAL LOW (ref 39.0–52.0)
Hemoglobin: 12.4 g/dL — ABNORMAL LOW (ref 13.0–17.0)
Lymphocytes Relative: 17.8 % (ref 12.0–46.0)
Lymphs Abs: 1 10*3/uL (ref 0.7–4.0)
MCHC: 34.5 g/dL (ref 30.0–36.0)
MCV: 98.1 fl (ref 78.0–100.0)
Monocytes Absolute: 0.4 10*3/uL (ref 0.1–1.0)
Monocytes Relative: 7.9 % (ref 3.0–12.0)
Neutro Abs: 4.1 10*3/uL (ref 1.4–7.7)
Neutrophils Relative %: 72.9 % (ref 43.0–77.0)
Platelets: 189 10*3/uL (ref 150.0–400.0)
RBC: 3.67 Mil/uL — ABNORMAL LOW (ref 4.22–5.81)
RDW: 13.7 % (ref 11.5–15.5)
WBC: 5.6 10*3/uL (ref 4.0–10.5)

## 2017-11-04 LAB — LIPID PANEL
Cholesterol: 118 mg/dL (ref 0–200)
HDL: 68.7 mg/dL (ref >39.00)
LDL Cholesterol: 42 mg/dL (ref 0–99)
NonHDL: 49.14
Total CHOL/HDL Ratio: 2
Triglycerides: 35 mg/dL (ref 0.0–149.0)
VLDL: 7 mg/dL (ref 0.0–40.0)

## 2017-11-04 MED ORDER — LEVOTHYROXINE SODIUM 25 MCG PO TABS: 25.0000 ug | ORAL_TABLET | Freq: Every day | ORAL | 0 refills | Status: DC

## 2017-11-04 NOTE — Progress Notes (Signed)
Subjective:    Patient ID: [name], male    DOB: [birth year], 82 y.o.   MRN: [id number]  HPI 82 year-old patient who is seen today in follow-up.  He has been seen by cardiology recently and has a history of mild bilateral carotid artery stenosis.  He has had a TIA in the past.  Denies any focal neurological symptoms. His main issue is ankylosing spondylitis.  He is followed by rheumatology and is being considered for a new biological agent.  He is trying to get this approved through their patient assistance program due to cost. He has essential hypertension which has been stable.  He has BPH and remains on tamsulosin. His only other complaint is a several month history of chronic cough.  This occurs mainly at night and is associated with sputum production.  Rheumatology has done a chest x-ray and screened  for tuberculosis.  He is a former smoker but discontinued in the early 90s.  Antihypertensive medication does include lisinopril   Past Medical History:  Diagnosis Date  . Ankylosing spondylitis ([place]) dx'd ~ 1974  . Arthritis    "back" (08/10/2015)  . BENIGN PROSTATIC HYPERTROPHY, WITH OBSTRUCTION 01/15/2010  . Bleeding duodenal ulcer   . Bleeding esophageal ulcer   . Bleeding stomach ulcer   . GERD 02/22/2008  . History of blood transfusion 1988   "lost ~ 1/2 of my blood volume from multiple bleeding ulcers"  . History of hiatal hernia   . HYPERGLYCEMIA 11/18/2007  . HYPERLIPIDEMIA 02/22/2008  . HYPERTENSION, UNSPECIFIED 11/10/2009  . Kidney stones   . Osteoarthritis    c-spine  . PEPTIC ULCER DISEASE 11/17/2007  . Situational depression    "son died in [place] July 17, 2015"  . TIA (transient ischemic attack)    "not that I know of in the past; they are trying to determine if I've had one today (08/10/2015)"     Social History   Socioeconomic History  . Marital status: Married    Spouse name: Not on file  . Number of children: Not on file  . Years of education: Not on file  .  Highest education level: Not on file  Social Needs  . Financial resource strain: Not on file  . Food insecurity - worry: Not on file  . Food insecurity - inability: Not on file  . Transportation needs - medical: Not on file  . Transportation needs - non-medical: Not on file  Occupational History  . Not on file  Tobacco Use  . Smoking status: Former Smoker    Years: 15.00    Types: Cigars, Pipe    Last attempt to quit: 08/26/1978    Years since quitting: 39.2  . Smokeless tobacco: Never Used  Substance and Sexual Activity  . Alcohol use: Yes    Comment: 08/10/2015 "glass of wine ~ month or so; occasionally a can of beer"  . Drug use: No  . Sexual activity: Yes  Other Topics Concern  . Not on file  Social History Narrative   Retired and married.     Past Surgical History:  Procedure Laterality Date  . CATARACT EXTRACTION W/ INTRAOCULAR LENS  IMPLANT, BILATERAL Bilateral 2013    Family History  Problem Relation Age of Onset  . Appendicitis Mother        complications  . Colon cancer Father   . Diabetes type II Father   . Lupus Sister   . Colon cancer Other  1st degree relative <60    No Known Allergies  Current Outpatient Medications on File Prior to Visit  Medication Sig Dispense Refill  . aspirin 81 MG tablet Take 81 mg by mouth daily.      Marland Kitchen atorvastatin (LIPITOR) 10 MG tablet Take 1 tablet (10 mg total) by mouth daily. 90 tablet 3  . Cholecalciferol (VITAMIN D PO) Take 1,000 Units by mouth daily.    . famotidine (PEPCID) 20 MG tablet Take 20 mg by mouth 2 (two) times daily.     . fish oil-omega-3 fatty acids 1000 MG capsule Take 1 g by mouth daily.     Marland Kitchen lisinopril-hydrochlorothiazide (PRINZIDE,ZESTORETIC) 20-12.5 MG tablet Take 0.5 tablets by mouth daily. 90 tablet 3  . lisinopril-hydrochlorothiazide (PRINZIDE,ZESTORETIC) 20-12.5 MG tablet TAKE ONE-HALF TABLET BY  MOUTH DAILY 45 tablet 10  . meloxicam (MOBIC) 15 MG tablet TAKE 1 TABLET BY MOUTH  DAILY 90  tablet 0  . Multiple Vitamin (MULTIVITAMIN WITH MINERALS) TABS tablet Take 1 tablet by mouth daily.    . nitroGLYCERIN (NITROSTAT) 0.4 MG SL tablet Place 1 tablet (0.4 mg total) under the tongue every 5 (five) minutes as needed. 25 tablet 6  . tamsulosin (FLOMAX) 0.4 MG CAPS capsule TAKE 1 CAPSULE BY MOUTH  DAILY 90 capsule 3  . [DISCONTINUED] omeprazole (PRILOSEC) 20 MG capsule Take 20 mg by mouth daily.       No current facility-administered medications on file prior to visit.     BP 138/60 (BP Location: Right Arm, Patient Position: Sitting, Cuff Size: Large)   Pulse 86   Temp 97.9 F (36.6 C) (Oral)   Wt 151 lb (68.5 kg)   SpO2 100%   BMI 24.01 kg/m      Review of Systems  Constitutional: Negative for appetite change, chills, fatigue and fever.  HENT: Negative for congestion, dental problem, ear pain, hearing loss, sore throat, tinnitus, trouble swallowing and voice change.   Eyes: Negative for pain, discharge and visual disturbance.  Respiratory: Positive for cough. Negative for chest tightness, wheezing and stridor.   Cardiovascular: Negative for chest pain, palpitations and leg swelling.  Gastrointestinal: Negative for abdominal distention, abdominal pain, blood in stool, constipation, diarrhea, nausea and vomiting.  Genitourinary: Negative for difficulty urinating, discharge, flank pain, genital sores, hematuria and urgency.  Musculoskeletal: Positive for arthralgias, back pain, neck pain and neck stiffness. Negative for gait problem, joint swelling and myalgias.  Skin: Negative for rash.  Neurological: Negative for dizziness, syncope, speech difficulty, weakness, numbness and headaches.  Hematological: Negative for adenopathy. Does not bruise/bleed easily.  Psychiatric/Behavioral: Negative for behavioral problems and dysphoric mood. The patient is not nervous/anxious.        Objective:   Physical Exam  Constitutional: He is oriented to person, place, and time. He  appears well-developed.  Blood pressure 130/64  HENT:  Head: Normocephalic.  Right Ear: External ear normal.  Left Ear: External ear normal.  Eyes: Conjunctivae and EOM are normal.  Neck:  Stooped posture with decreased range of motion of the neck and shoulders  Cardiovascular: Normal rate and normal heart sounds.  Pulmonary/Chest: Breath sounds normal. No respiratory distress. He has no wheezes. He has no rales.  Abdominal: Bowel sounds are normal.  Musculoskeletal: Normal range of motion. He exhibits no edema or tenderness.  Neurological: He is alert and oriented to person, place, and time.  Psychiatric: He has a normal mood and affect. His behavior is normal.  Assessment & Plan:   Chronic cough associated with sputum production.  Patient has had a chest x-ray performed per rheumatology.  This does not sound like an ACE associated cough but is more intermittent and associated with sputum production.   Essential hypertension stable Ankylosing spondylitis.  Follow-up rheumatology BPH.  Continue Flomax  carotid artery stenosis.  This is mild.  Continue low-dose aspirin and statin therapy  Follow-up rheumatology and cardiology Follow-up your 1 year or as needed  Nyoka Cowden

## 2017-11-04 NOTE — Addendum Note (Signed)
Addended by: Gwenyth Ober R on: 11/04/2017 04:47 PM   Modules accepted: Orders

## 2017-11-04 NOTE — Progress Notes (Signed)
Spoke to patient and he verbalized understanding. Patient will call back tomorrow to schedule an appointment.

## 2017-11-04 NOTE — Patient Instructions (Signed)
Limit your sodium (Salt) intake  Please check your blood pressure on a regular basis.  If it is consistently greater than 150/90, please make an office appointment.    It is important that you exercise regularly, at least 20 minutes 3 to 4 times per week.  If you develop chest pain or shortness of breath seek  medical attention.  Return in one year for follow-up  

## 2017-11-19 DIAGNOSIS — L821 Other seborrheic keratosis: Secondary | ICD-10-CM | POA: Diagnosis not present

## 2017-11-19 DIAGNOSIS — L578 Other skin changes due to chronic exposure to nonionizing radiation: Secondary | ICD-10-CM | POA: Diagnosis not present

## 2017-11-19 DIAGNOSIS — L814 Other melanin hyperpigmentation: Secondary | ICD-10-CM | POA: Diagnosis not present

## 2017-11-19 DIAGNOSIS — D1801 Hemangioma of skin and subcutaneous tissue: Secondary | ICD-10-CM | POA: Diagnosis not present

## 2017-11-19 DIAGNOSIS — L570 Actinic keratosis: Secondary | ICD-10-CM | POA: Diagnosis not present

## 2017-12-01 DIAGNOSIS — M545 Low back pain: Secondary | ICD-10-CM | POA: Diagnosis not present

## 2017-12-01 DIAGNOSIS — G5601 Carpal tunnel syndrome, right upper limb: Secondary | ICD-10-CM | POA: Diagnosis not present

## 2017-12-01 DIAGNOSIS — M459 Ankylosing spondylitis of unspecified sites in spine: Secondary | ICD-10-CM | POA: Diagnosis not present

## 2017-12-01 DIAGNOSIS — M5382 Other specified dorsopathies, cervical region: Secondary | ICD-10-CM | POA: Diagnosis not present

## 2017-12-01 DIAGNOSIS — R5383 Other fatigue: Secondary | ICD-10-CM | POA: Diagnosis not present

## 2017-12-01 DIAGNOSIS — Z6824 Body mass index (BMI) 24.0-24.9, adult: Secondary | ICD-10-CM | POA: Diagnosis not present

## 2017-12-01 DIAGNOSIS — M150 Primary generalized (osteo)arthritis: Secondary | ICD-10-CM | POA: Diagnosis not present

## 2017-12-01 DIAGNOSIS — M436 Torticollis: Secondary | ICD-10-CM | POA: Diagnosis not present

## 2017-12-25 ENCOUNTER — Encounter: Payer: Self-pay | Admitting: Internal Medicine

## 2017-12-25 ENCOUNTER — Ambulatory Visit (INDEPENDENT_AMBULATORY_CARE_PROVIDER_SITE_OTHER): Payer: Medicare Other | Admitting: Internal Medicine

## 2017-12-25 VITALS — BP 122/60 | HR 81 | Temp 97.7°F | Wt 148.0 lb

## 2017-12-25 DIAGNOSIS — M453 Ankylosing spondylitis of cervicothoracic region: Secondary | ICD-10-CM | POA: Diagnosis not present

## 2017-12-25 DIAGNOSIS — I6529 Occlusion and stenosis of unspecified carotid artery: Secondary | ICD-10-CM | POA: Diagnosis not present

## 2017-12-25 DIAGNOSIS — I10 Essential (primary) hypertension: Secondary | ICD-10-CM | POA: Diagnosis not present

## 2017-12-25 DIAGNOSIS — L570 Actinic keratosis: Secondary | ICD-10-CM | POA: Diagnosis not present

## 2017-12-25 DIAGNOSIS — E039 Hypothyroidism, unspecified: Secondary | ICD-10-CM | POA: Diagnosis not present

## 2017-12-25 LAB — TSH: TSH: 3.29 u[IU]/mL (ref 0.35–4.50)

## 2017-12-25 NOTE — Progress Notes (Signed)
Subjective:    Patient ID: [name], male    DOB: [birth year], 82 y.o.   MRN: [id number]  HPI  82 year old patient who is followed by rheumatology with ankylosing spondylitis.  He has essential hypertension.  Recent laboratory screen revealed an elevated TSH and he is now on levothyroxine 0.025 mg daily. He has been taking this medicine fasting in the morning. Blood pressure well controlled today  Past Medical History:  Diagnosis Date  . Ankylosing spondylitis ([place]) dx'd ~ 1974  . Arthritis    "back" (08/10/2015)  . BENIGN PROSTATIC HYPERTROPHY, WITH OBSTRUCTION 01/15/2010  . Bleeding duodenal ulcer   . Bleeding esophageal ulcer   . Bleeding stomach ulcer   . GERD 02/22/2008  . History of blood transfusion 1988   "lost ~ 1/2 of my blood volume from multiple bleeding ulcers"  . History of hiatal hernia   . HYPERGLYCEMIA 11/18/2007  . HYPERLIPIDEMIA 02/22/2008  . HYPERTENSION, UNSPECIFIED 11/10/2009  . Kidney stones   . Osteoarthritis    c-spine  . PEPTIC ULCER DISEASE 11/17/2007  . Situational depression    "son died in [place] July 02, 2015"  . TIA (transient ischemic attack)    "not that I know of in the past; they are trying to determine if I've had one today (08/10/2015)"     Social History   Socioeconomic History  . Marital status: Married    Spouse name: Not on file  . Number of children: Not on file  . Years of education: Not on file  . Highest education level: Not on file  Occupational History  . Not on file  Social Needs  . Financial resource strain: Not on file  . Food insecurity:    Worry: Not on file    Inability: Not on file  . Transportation needs:    Medical: Not on file    Non-medical: Not on file  Tobacco Use  . Smoking status: Former Smoker    Years: 15.00    Types: Cigars, Pipe    Last attempt to quit: 08/26/1978    Years since quitting: 39.3  . Smokeless tobacco: Never Used  Substance and Sexual Activity  . Alcohol use: Yes    Comment: 08/10/2015  "glass of wine ~ month or so; occasionally a can of beer"  . Drug use: No  . Sexual activity: Yes  Lifestyle  . Physical activity:    Days per week: Not on file    Minutes per session: Not on file  . Stress: Not on file  Relationships  . Social connections:    Talks on phone: Not on file    Gets together: Not on file    Attends religious service: Not on file    Active member of club or organization: Not on file    Attends meetings of clubs or organizations: Not on file    Relationship status: Not on file  . Intimate partner violence:    Fear of current or ex partner: Not on file    Emotionally abused: Not on file    Physically abused: Not on file    Forced sexual activity: Not on file  Other Topics Concern  . Not on file  Social History Narrative   Retired and married.     Past Surgical History:  Procedure Laterality Date  . CATARACT EXTRACTION W/ INTRAOCULAR LENS  IMPLANT, BILATERAL Bilateral 2013    Family History  Problem Relation Age of Onset  . Appendicitis Mother  complications  . Colon cancer Father   . Diabetes type II Father   . Lupus Sister   . Colon cancer Other        1st degree relative <60    No Known Allergies  Current Outpatient Medications on File Prior to Visit  Medication Sig Dispense Refill  . aspirin 81 MG tablet Take 81 mg by mouth daily.      Marland Kitchen atorvastatin (LIPITOR) 10 MG tablet Take 1 tablet (10 mg total) by mouth daily. 90 tablet 3  . Cholecalciferol (VITAMIN D PO) Take 1,000 Units by mouth daily.    . famotidine (PEPCID) 20 MG tablet Take 20 mg by mouth 2 (two) times daily.     . fish oil-omega-3 fatty acids 1000 MG capsule Take 1 g by mouth daily.     Marland Kitchen levothyroxine (SYNTHROID, LEVOTHROID) 25 MCG tablet Take 1 tablet (25 mcg total) by mouth daily before breakfast. 90 tablet 0  . lisinopril-hydrochlorothiazide (PRINZIDE,ZESTORETIC) 20-12.5 MG tablet Take 0.5 tablets by mouth daily. 90 tablet 3  . meloxicam (MOBIC) 15 MG tablet  TAKE 1 TABLET BY MOUTH  DAILY 90 tablet 0  . Multiple Vitamin (MULTIVITAMIN WITH MINERALS) TABS tablet Take 1 tablet by mouth daily.    . nitroGLYCERIN (NITROSTAT) 0.4 MG SL tablet Place 1 tablet (0.4 mg total) under the tongue every 5 (five) minutes as needed. 25 tablet 6  . tamsulosin (FLOMAX) 0.4 MG CAPS capsule TAKE 1 CAPSULE BY MOUTH  DAILY 90 capsule 3  . [DISCONTINUED] omeprazole (PRILOSEC) 20 MG capsule Take 20 mg by mouth daily.       No current facility-administered medications on file prior to visit.     BP 122/60 (BP Location: Right Arm, Patient Position: Sitting, Cuff Size: Large)   Pulse 81   Temp 97.7 F (36.5 C) (Oral)   Wt 148 lb (67.1 kg)   SpO2 99%   BMI 23.53 kg/m     Review of Systems  Constitutional: Negative for appetite change, chills, fatigue and fever.  HENT: Negative for congestion, dental problem, ear pain, hearing loss, sore throat, tinnitus, trouble swallowing and voice change.   Eyes: Negative for pain, discharge and visual disturbance.  Respiratory: Negative for cough, chest tightness, wheezing and stridor.   Cardiovascular: Negative for chest pain, palpitations and leg swelling.  Gastrointestinal: Negative for abdominal distention, abdominal pain, blood in stool, constipation, diarrhea, nausea and vomiting.  Genitourinary: Negative for difficulty urinating, discharge, flank pain, genital sores, hematuria and urgency.  Musculoskeletal: Positive for back pain and gait problem. Negative for arthralgias, joint swelling, myalgias and neck stiffness.  Skin: Negative for rash.  Neurological: Negative for dizziness, syncope, speech difficulty, weakness, numbness and headaches.  Hematological: Negative for adenopathy. Does not bruise/bleed easily.  Psychiatric/Behavioral: Negative for behavioral problems and dysphoric mood. The patient is not nervous/anxious.        Objective:   Physical Exam  Constitutional: He is oriented to person, place, and time. He  appears well-developed.  HENT:  Head: Normocephalic.  Right Ear: External ear normal.  Left Ear: External ear normal.  Eyes: Conjunctivae and EOM are normal.  Neck: Normal range of motion.  Cardiovascular: Normal rate and normal heart sounds.  Pulmonary/Chest: Breath sounds normal.  Abdominal: Bowel sounds are normal.  Musculoskeletal: Normal range of motion. He exhibits no edema or tenderness.  Neurological: He is alert and oriented to person, place, and time.  Psychiatric: He has a normal mood and affect. His behavior is normal.  Assessment & Plan:   Hypothyroidism.  We will check a follow-up TSH Essential hypertension well-controlled Ankylosing spondylitis.  Follow-up rheumatology  Follow-up here 6 months  [id number],PETER Pilar Plate

## 2017-12-25 NOTE — Patient Instructions (Signed)
Limit your sodium (Salt) intake  Please check your blood pressure on a regular basis.  If it is consistently greater than 150/90, please make an office appointment.  Return in 6 months for follow-up   

## 2018-01-13 DIAGNOSIS — H52203 Unspecified astigmatism, bilateral: Secondary | ICD-10-CM | POA: Diagnosis not present

## 2018-01-13 DIAGNOSIS — Z961 Presence of intraocular lens: Secondary | ICD-10-CM | POA: Diagnosis not present

## 2018-01-13 DIAGNOSIS — H5213 Myopia, bilateral: Secondary | ICD-10-CM | POA: Diagnosis not present

## 2018-01-13 DIAGNOSIS — H26491 Other secondary cataract, right eye: Secondary | ICD-10-CM | POA: Diagnosis not present

## 2018-01-20 ENCOUNTER — Ambulatory Visit (INDEPENDENT_AMBULATORY_CARE_PROVIDER_SITE_OTHER): Payer: Medicare Other

## 2018-01-20 ENCOUNTER — Encounter (INDEPENDENT_AMBULATORY_CARE_PROVIDER_SITE_OTHER): Payer: Self-pay | Admitting: Orthopaedic Surgery

## 2018-01-20 ENCOUNTER — Ambulatory Visit (INDEPENDENT_AMBULATORY_CARE_PROVIDER_SITE_OTHER): Payer: Medicare Other | Admitting: Orthopaedic Surgery

## 2018-01-20 DIAGNOSIS — M542 Cervicalgia: Secondary | ICD-10-CM

## 2018-01-20 DIAGNOSIS — I6529 Occlusion and stenosis of unspecified carotid artery: Secondary | ICD-10-CM | POA: Diagnosis not present

## 2018-01-20 DIAGNOSIS — M453 Ankylosing spondylitis of cervicothoracic region: Secondary | ICD-10-CM | POA: Diagnosis not present

## 2018-01-20 NOTE — Progress Notes (Signed)
The patient has known significant ankylosing spondylitis.  He is now undergoing infusion treatments through his rheumatologist.  He is known to try to work as best as he can on posture with his cervical spine.  He says sometimes if he sitting in certain chairs that it does change his voice.  We wanted to repeat x-rays today and compared today's x-rays to cervical spine films from 6 months ago.  He has had no other acute changes in medical status.  On examination he holds his neck in more forward flexed position.  He does have improved range of motion to therapy in terms of lateral rotation and bending and forward flexion and some extension.  The x-rays are seen today and compared to previous films.  The AP view cannot be visualized as well due to the position of his head to the lateral view shows no significant bridging osteophytes and show Well-Maintained Disc Hts. and Vertebral Body Hts. compared to films from 6 months ago.  The facet joints are also seen from the lateral view and appear well-maintained.  At this point he wants to try some type of collar at the cervical spine that may help just a little bit with his posture.  We will start with a soft collar which he can come in and out as comfort allows.  The next step would be potentially some type of rigid collar.  I will see him back in 4 weeks to see how is doing overall but no x-rays are needed.

## 2018-01-21 ENCOUNTER — Ambulatory Visit: Payer: Medicare Other | Admitting: Podiatry

## 2018-01-22 ENCOUNTER — Other Ambulatory Visit (INDEPENDENT_AMBULATORY_CARE_PROVIDER_SITE_OTHER): Payer: Self-pay | Admitting: Orthopaedic Surgery

## 2018-01-22 ENCOUNTER — Other Ambulatory Visit: Payer: Self-pay | Admitting: Cardiology

## 2018-01-23 ENCOUNTER — Encounter: Payer: Self-pay | Admitting: Podiatry

## 2018-01-23 ENCOUNTER — Ambulatory Visit (INDEPENDENT_AMBULATORY_CARE_PROVIDER_SITE_OTHER): Payer: Medicare Other | Admitting: Podiatry

## 2018-01-23 DIAGNOSIS — M453 Ankylosing spondylitis of cervicothoracic region: Secondary | ICD-10-CM | POA: Diagnosis not present

## 2018-01-23 DIAGNOSIS — M79676 Pain in unspecified toe(s): Secondary | ICD-10-CM | POA: Diagnosis not present

## 2018-01-23 DIAGNOSIS — B351 Tinea unguium: Secondary | ICD-10-CM

## 2018-01-23 NOTE — Progress Notes (Signed)
Patient ID: [name], male   DOB: 1936-08-14, 82 y.o.   MRN: [id number] Complaint:  Visit Type: Patient returns to my office for continued preventative foot care services. Complaint: Patient states" my nails have grown long and thick and become painful to walk and wear shoes" . The patient presents for preventative foot care services. No changes to ROS.  Patient is unable to self treat due to ankylosing spondylitis.  Podiatric Exam: Vascular: dorsalis pedis and posterior tibial pulses are palpable bilateral. Capillary return is immediate. Temperature gradient is WNL. Skin turgor WNL  Sensorium: Normal Semmes Weinstein monofilament test. Normal tactile sensation bilaterally. Nail Exam: Pt has thick disfigured discolored nails with subungual debris noted bilateral entire nail hallux through fifth toenails Ulcer Exam: There is no evidence of ulcer or pre-ulcerative changes or infection. Orthopedic Exam: Muscle tone and strength are WNL. No limitations in general ROM. No crepitus or effusions noted. Foot type and digits show no abnormalities. Bony prominences are unremarkable. Skin: No Porokeratosis. No infection or ulcers  Diagnosis:  Onychomycosis, , Pain in right toe, pain in left toes  Treatment & Plan Procedures and Treatment: Consent by patient was obtained for treatment procedures. The patient understood the discussion of treatment and procedures well. All questions were answered thoroughly reviewed. Debridement of mycotic and hypertrophic toenails, 1 through 5 bilateral and clearing of subungual debris. No ulceration, no infection noted.   ABN signed for 2019.                  Return Visit-Office Procedure: Patient instructed to return to the office for a follow up visit 3 months for continued evaluation and treatment.    Gardiner Barefoot DPM

## 2018-02-19 ENCOUNTER — Ambulatory Visit (INDEPENDENT_AMBULATORY_CARE_PROVIDER_SITE_OTHER): Payer: Medicare Other | Admitting: Orthopaedic Surgery

## 2018-02-19 ENCOUNTER — Encounter (INDEPENDENT_AMBULATORY_CARE_PROVIDER_SITE_OTHER): Payer: Self-pay | Admitting: Orthopaedic Surgery

## 2018-02-19 ENCOUNTER — Other Ambulatory Visit (INDEPENDENT_AMBULATORY_CARE_PROVIDER_SITE_OTHER): Payer: Self-pay | Admitting: Physician Assistant

## 2018-02-19 DIAGNOSIS — M453 Ankylosing spondylitis of cervicothoracic region: Secondary | ICD-10-CM | POA: Diagnosis not present

## 2018-02-19 DIAGNOSIS — I6529 Occlusion and stenosis of unspecified carotid artery: Secondary | ICD-10-CM | POA: Diagnosis not present

## 2018-02-19 MED ORDER — MELOXICAM 15 MG PO TABS: 15.0000 mg | ORAL_TABLET | Freq: Every day | ORAL | 4 refills | Status: DC

## 2018-02-19 NOTE — Progress Notes (Signed)
HPI: Mr. Uhde returns today for follow-up of his neck.  He states that his pain is not any worse but it really has not gotten any better.  He did not wear the soft collar for a week while he was on vacation.  Feels like the Mobic is helping in regards to his neck.  He is having no radicular symptoms down either arm.  He is undergoing infusion treatments with his rheumatologist and is not sure if these are helping as of yet.  Physical exam has full flexion of the cervical spine.  Is good motion from left to right.  Extension he has somewhat limited motion.    Impression: Cervical ankylosis spondylitis   Plan: At this point time we will continue exercises as taught by therapy continue the soft collar as tolerated.  We will continue his Mobic.  He will follow-up with [place] on as-needed basis.  Questions encouraged and answered by Dr. Ninfa Linden and myself today.

## 2018-03-09 DIAGNOSIS — E039 Hypothyroidism, unspecified: Secondary | ICD-10-CM | POA: Diagnosis not present

## 2018-03-09 DIAGNOSIS — M436 Torticollis: Secondary | ICD-10-CM | POA: Diagnosis not present

## 2018-03-09 DIAGNOSIS — Z6823 Body mass index (BMI) 23.0-23.9, adult: Secondary | ICD-10-CM | POA: Diagnosis not present

## 2018-03-09 DIAGNOSIS — M150 Primary generalized (osteo)arthritis: Secondary | ICD-10-CM | POA: Diagnosis not present

## 2018-03-09 DIAGNOSIS — M459 Ankylosing spondylitis of unspecified sites in spine: Secondary | ICD-10-CM | POA: Diagnosis not present

## 2018-03-09 DIAGNOSIS — M545 Low back pain: Secondary | ICD-10-CM | POA: Diagnosis not present

## 2018-03-09 DIAGNOSIS — Z79899 Other long term (current) drug therapy: Secondary | ICD-10-CM | POA: Diagnosis not present

## 2018-03-19 ENCOUNTER — Encounter: Payer: Self-pay | Admitting: Physical Therapy

## 2018-03-19 ENCOUNTER — Other Ambulatory Visit: Payer: Self-pay

## 2018-03-19 ENCOUNTER — Ambulatory Visit: Payer: Medicare Other | Attending: Rheumatology | Admitting: Physical Therapy

## 2018-03-19 DIAGNOSIS — M542 Cervicalgia: Secondary | ICD-10-CM | POA: Insufficient documentation

## 2018-03-19 DIAGNOSIS — R293 Abnormal posture: Secondary | ICD-10-CM

## 2018-03-19 NOTE — Therapy (Signed)
[facility], [place], [address] Phone: [phone]   Fax:  [phone]  Physical Therapy Evaluation  Patient Details  Name: [name] MRN: [id number] Date of Birth: 1935/09/21 Referring Provider: Nancy Nordmann MD   Encounter Date: 03/19/2018  PT End of Session - 03/19/18 1232    Visit Number  1    Number of Visits  3    Date for PT Re-Evaluation  04/30/18    PT Start Time  1030    PT Stop Time  1104    PT Time Calculation (min)  34 min    Activity Tolerance  Patient tolerated treatment well    Behavior During Therapy  [facility] for tasks assessed/performed       Past Medical History:  Diagnosis Date  . Ankylosing spondylitis ([place]) dx'd ~ 1974  . Arthritis    "back" (08/10/2015)  . BENIGN PROSTATIC HYPERTROPHY, WITH OBSTRUCTION 01/15/2010  . Bleeding duodenal ulcer   . Bleeding esophageal ulcer   . Bleeding stomach ulcer   . GERD 02/22/2008  . History of blood transfusion 1988   "lost ~ 1/2 of my blood volume from multiple bleeding ulcers"  . History of hiatal hernia   . HYPERGLYCEMIA 11/18/2007  . HYPERLIPIDEMIA 02/22/2008  . HYPERTENSION, UNSPECIFIED 11/10/2009  . Kidney stones   . Osteoarthritis    c-spine  . PEPTIC ULCER DISEASE 11/17/2007  . Situational depression    "son died in [place] 2015-07-09"  . TIA (transient ischemic attack)    "not that I know of in the past; they are trying to determine if I've had one today (08/10/2015)"    Past Surgical History:  Procedure Laterality Date  . CATARACT EXTRACTION W/ INTRAOCULAR LENS  IMPLANT, BILATERAL Bilateral 2013    There were no vitals filed for this visit.   Subjective Assessment - 03/19/18 1237    Subjective  The patient returns to PT with a referral for 3 visits for neck pain and to work on posture.  Patient in for evaluation today and then will return around mid-August as he will be out of town.  Patient reports no falls but has has "to be careful."   He really hopes to improve his posture a bit so he can turn better while driving.    Pertinent History  Arhtritis; anklosing spondylitis; possible TIA.  Long term right ankle injury.    How long can you walk comfortably?  Short distances around community.    Patient Stated Goals  Improve posture.         [facility] PT Assessment - 03/19/18 0001      Assessment   Medical Diagnosis  Ankylosing spondylitid.    Referring Provider  Nancy Nordmann MD    Onset Date/Surgical Date  -- Ongoing.      Precautions   Precautions  Fall      Restrictions   Weight Bearing Restrictions  No    Other Position/Activity Restrictions  -- 1.      Balance Screen   Has the patient fallen in the past 6 months  Yes    How many times?  -- 1.    Has the patient had a decrease in activity level because of a fear of falling?   Yes    Is the patient reluctant to leave their home because of a fear of falling?   No      Home Film/video editor residence  Prior Function   Level of Independence  Independent      Posture/Postural Control   Posture/Postural Control  Postural limitations    Postural Limitations  Rounded Shoulders;Forward head;Decreased lumbar lordosis;Decreased thoracic kyphosis;Flexed trunk    Posture Comments  Head forward 2 inches of rounded shoulders.      ROM / Strength   AROM / PROM / Strength  AROM;Strength      AROM   Overall AROM Comments  Bilateral active cervical rotation= 45 degrees and bilateral active cervical SBing= 5 degrees.  Left shoulder flexion= 95 degrees and right shoulder flexion= 105 degrees.  Left forearm supination= 30 degrees.      Strength   Overall Strength Comments  Bilateral shoulder strength= 4 to 4+/5.      Palpation   Palpation comment  Great deal of tone diffusely over patient's cervical musculature.  He has a palpable lipoma on the right side of his neck which he states has been there for years.      Special Tests   Other special  tests  (-) Romberg test but needs supervision for safety.      Transfers   Comments  Sit to stand with use of armrests.      Ambulation/Gait   Gait Comments  Slow and cautious gait pattern with mild shuffling and increased right foot toe out.  The patient walks in some spinal flexion.                Objective measurements completed on examination: See above findings.                PT Short Term Goals - 03/19/18 1300      PT SHORT TERM GOAL #1   Title  STG's=LTG's.        PT Long Term Goals - 03/19/18 1300      PT LONG TERM GOAL #1   Title  Bilateral active cervical rotation= 55. degrees so he can turn his head more easiliy while driving.    Time  6    Period  Weeks    Status  New      PT LONG TERM GOAL #2   Title  Patient able to improve forward head posture by 1/2 to 1 inch.    Time  8    Period  Weeks    Status  New             Plan - 03/19/18 1256    Clinical Impression Statement  The patient presents to OPPT with multiple postural abnormalities.  He hopes to improve his posture.  His gait is slow and cautious.  He reports one fall in the last 6 months.  He demonstrated a negative Romberg test today but required supervision at all times when he is on his feet.  Recommended that patient use cane.  Patient expected to improve with skilled PT to improve posture.    History and Personal Factors relevant to plan of care:  Arhtritis; anklosing spondylitis; possible TIA.  Long term right ankle injury.    Clinical Presentation  Evolving    Clinical Decision Making  Moderate    Rehab Potential  Good    PT Frequency  -- 3 visits.    PT Treatment/Interventions  ADLs/Self Care Home Management;Therapeutic activities;Therapeutic exercise;Balance training;Neuromuscular re-education;Patient/family education    PT Next Visit Plan  Work on improving posture; scapular strengthening.    Consulted and Agree with Plan of Care  Patient  Patient will  benefit from skilled therapeutic intervention in order to improve the following deficits and impairments:  Abnormal gait, Decreased activity tolerance, Decreased range of motion, Decreased strength, Postural dysfunction, Decreased balance  Visit Diagnosis: Cervicalgia - Plan: PT plan of care cert/re-cert  Abnormal posture - Plan: PT plan of care cert/re-cert     Problem List Patient Active Problem List   Diagnosis Date Noted  . Ankylosing spondylitis of cervicothoracic region ([place]) 10/16/2016  . TIA (transient ischemic attack) 08/10/2015  . Carotid stenosis 08/04/2014  . Hyperlipidemia 02/21/2012  . BENIGN PROSTATIC HYPERTROPHY, WITH OBSTRUCTION 01/15/2010  . Essential hypertension 11/10/2009  . GERD 02/22/2008  . NEPHROLITHIASIS, HX OF 11/17/2007    [name], [name] MPT 03/19/2018, 1:04 PM  [facility] [address] [place], [place], [address] Phone: [phone]   Fax:  [phone]  Name: [name] MRN: [id number] Date of Birth: 1936-04-05

## 2018-03-26 ENCOUNTER — Telehealth: Payer: Self-pay | Admitting: Internal Medicine

## 2018-03-26 ENCOUNTER — Other Ambulatory Visit: Payer: Self-pay | Admitting: *Deleted

## 2018-03-26 MED ORDER — LEVOTHYROXINE SODIUM 25 MCG PO TABS: 25.0000 ug | ORAL_TABLET | Freq: Every day | ORAL | 0 refills | Status: DC

## 2018-03-26 NOTE — Telephone Encounter (Signed)
Rx refilled per protocol. LOV/lab: 12/25/17

## 2018-03-26 NOTE — Telephone Encounter (Signed)
Copied from [place] [id number]. Topic: Quick Communication - See Telephone Encounter >> Mar 26, 2018 11:51 AM Mylinda Latina, NT wrote: CRM for notification. See Telephone encounter for: 03/26/18. Patient called and states he needs a refill of his levothyroxine (SYNTHROID, LEVOTHROID) 25 MCG tablet   [facility] [place] [place] [address] [phone] (Phone) [phone] (Fax)

## 2018-03-30 ENCOUNTER — Other Ambulatory Visit: Payer: Self-pay

## 2018-04-10 ENCOUNTER — Other Ambulatory Visit (INDEPENDENT_AMBULATORY_CARE_PROVIDER_SITE_OTHER): Payer: Self-pay | Admitting: Orthopaedic Surgery

## 2018-04-15 ENCOUNTER — Ambulatory Visit: Payer: Medicare Other | Attending: Rheumatology | Admitting: Physical Therapy

## 2018-04-15 DIAGNOSIS — M542 Cervicalgia: Secondary | ICD-10-CM | POA: Diagnosis not present

## 2018-04-15 DIAGNOSIS — R293 Abnormal posture: Secondary | ICD-10-CM | POA: Diagnosis not present

## 2018-04-15 NOTE — Therapy (Signed)
[facility] [place], [place], [address] Phone: [phone]   Fax:  [phone]  Physical Therapy Treatment  Patient Details  Name: [name] MRN: [id number] Date of Birth: Jun 20, 1936 Referring Provider: Nancy Nordmann MD   Encounter Date: 04/15/2018  PT End of Session - 04/15/18 1034    Visit Number  2    Number of Visits  3    Date for PT Re-Evaluation  04/30/18    PT Start Time  1030    PT Stop Time  1120    PT Time Calculation (min)  50 min    Activity Tolerance  Patient tolerated treatment well    Behavior During Therapy  [facility] for tasks assessed/performed       Past Medical History:  Diagnosis Date  . Ankylosing spondylitis ([place]) dx'd ~ 1974  . Arthritis    "back" (08/10/2015)  . BENIGN PROSTATIC HYPERTROPHY, WITH OBSTRUCTION 01/15/2010  . Bleeding duodenal ulcer   . Bleeding esophageal ulcer   . Bleeding stomach ulcer   . GERD 02/22/2008  . History of blood transfusion 1988   "lost ~ 1/2 of my blood volume from multiple bleeding ulcers"  . History of hiatal hernia   . HYPERGLYCEMIA 11/18/2007  . HYPERLIPIDEMIA 02/22/2008  . HYPERTENSION, UNSPECIFIED 11/10/2009  . Kidney stones   . Osteoarthritis    c-spine  . PEPTIC ULCER DISEASE 11/17/2007  . Situational depression    "son died in [place] 19-Jul-2015"  . TIA (transient ischemic attack)    "not that I know of in the past; they are trying to determine if I've had one today (08/10/2015)"    Past Surgical History:  Procedure Laterality Date  . CATARACT EXTRACTION W/ INTRAOCULAR LENS  IMPLANT, BILATERAL Bilateral 2013    There were no vitals filed for this visit.                    [place] [facility] Adult PT Treatment/Exercise - 04/15/18 0001      Exercises   Exercises  Neck;Lumbar      Neck Exercises: Machines for Strengthening   UBE (Upper Arm Bike)  120 RPM x8 minutes (x4 fwd, x4 bwd) standing      Neck Exercises: Theraband   Rows  20 reps;Red      Neck Exercises: Seated   Cervical Rotation  Both;20 reps   2" hold with finger overpressure   X to V  20 reps      Lumbar Exercises: Seated   Hip Flexion on Ball  AROM;Right;20 reps   on plinth   Sit to Stand  20 reps    Other Seated Lumbar Exercises  chair crunches x20, followed by thoracic extension x20 with pillow    Other Seated Lumbar Exercises  clam shells x20 red theraband               PT Short Term Goals - 03/19/18 1300      PT SHORT TERM GOAL #1   Title  STG's=LTG's.        PT Long Term Goals - 03/19/18 1300      PT LONG TERM GOAL #1   Title  Bilateral active cervical rotation= 55. degrees so he can turn his head more easiliy while driving.    Time  6    Period  Weeks    Status  New      PT LONG TERM GOAL #2   Title  Patient able to improve forward head  posture by 1/2 to 1 inch.    Time  8    Period  Weeks    Status  New            Plan - 04/15/18 1127    Clinical Impression Statement  Patient was able to tolerate treatment well with minimal reports of muscle fatigue. Patient required intermittent verbal cues for proper form and to prevent leaning backward while performing exercises. Patient was able to sit to stand with minimal loss of balance. Patient provided with HEP of gentle AROM and strengthening exercises to be performed at home. Patient reported understanding.    Clinical Presentation  Evolving    Clinical Decision Making  Moderate    Rehab Potential  Good    PT Frequency  --   3 visits   PT Treatment/Interventions  ADLs/Self Care Home Management;Therapeutic activities;Therapeutic exercise;Balance training;Neuromuscular re-education;Patient/family education    PT Next Visit Plan  Work on improving posture; scapular strengthening.    Consulted and Agree with Plan of Care  Patient       Patient will benefit from skilled therapeutic intervention in order to improve the following deficits and impairments:  Abnormal gait, Decreased  activity tolerance, Decreased range of motion, Decreased strength, Postural dysfunction, Decreased balance  Visit Diagnosis: Cervicalgia  Abnormal posture     Problem List Patient Active Problem List   Diagnosis Date Noted  . Ankylosing spondylitis of cervicothoracic region ([place]) 10/16/2016  . TIA (transient ischemic attack) 08/10/2015  . Carotid stenosis 08/04/2014  . Hyperlipidemia 02/21/2012  . BENIGN PROSTATIC HYPERTROPHY, WITH OBSTRUCTION 01/15/2010  . Essential hypertension 11/10/2009  . GERD 02/22/2008  . NEPHROLITHIASIS, HX OF 11/17/2007   [name], PT, DPT 04/15/2018, 11:59 AM  [facility] [address] [place], [place], [address] Phone: [phone]   Fax:  [phone]  Name: [name] MRN: [id number] Date of Birth: 16-Apr-1936

## 2018-04-20 ENCOUNTER — Ambulatory Visit: Payer: Medicare Other | Admitting: Physical Therapy

## 2018-04-20 ENCOUNTER — Encounter: Payer: Self-pay | Admitting: Physical Therapy

## 2018-04-20 DIAGNOSIS — R293 Abnormal posture: Secondary | ICD-10-CM

## 2018-04-20 DIAGNOSIS — M542 Cervicalgia: Secondary | ICD-10-CM | POA: Diagnosis not present

## 2018-04-20 NOTE — Therapy (Signed)
[facility] [place], [place], [address] Phone: [phone]   Fax:  [phone]  Physical Therapy Treatment  Patient Details  Name: [name] MRN: [id number] Date of Birth: 09-Dec-1935 Referring Provider: Nancy Nordmann MD   Encounter Date: 04/20/2018  PT End of Session - 04/20/18 1047    Visit Number  3    Number of Visits  6    Date for PT Re-Evaluation  05/15/18    PT Start Time  1030    PT Stop Time  1120    PT Time Calculation (min)  50 min    Activity Tolerance  Patient tolerated treatment well    Behavior During Therapy  [facility] for tasks assessed/performed       Past Medical History:  Diagnosis Date  . Ankylosing spondylitis ([place]) dx'd ~ 1974  . Arthritis    "back" (08/10/2015)  . BENIGN PROSTATIC HYPERTROPHY, WITH OBSTRUCTION 01/15/2010  . Bleeding duodenal ulcer   . Bleeding esophageal ulcer   . Bleeding stomach ulcer   . GERD 02/22/2008  . History of blood transfusion 1988   "lost ~ 1/2 of my blood volume from multiple bleeding ulcers"  . History of hiatal hernia   . HYPERGLYCEMIA 11/18/2007  . HYPERLIPIDEMIA 02/22/2008  . HYPERTENSION, UNSPECIFIED 11/10/2009  . Kidney stones   . Osteoarthritis    c-spine  . PEPTIC ULCER DISEASE 11/17/2007  . Situational depression    "son died in [place] Jul 04, 2015"  . TIA (transient ischemic attack)    "not that I know of in the past; they are trying to determine if I've had one today (08/10/2015)"    Past Surgical History:  Procedure Laterality Date  . CATARACT EXTRACTION W/ INTRAOCULAR LENS  IMPLANT, BILATERAL Bilateral 2013    There were no vitals filed for this visit.  Subjective Assessment - 04/20/18 1046    Subjective  Patient reports feeling "stiff" but no pain.    Pertinent History  Arhtritis; anklosing spondylitis; possible TIA.  Long term right ankle injury.    How long can you walk comfortably?  Short distances around community.    Patient Stated Goals   Improve posture.    Currently in Pain?  No/denies         [facility] PT Assessment - 04/20/18 0001      Assessment   Medical Diagnosis  Ankylosing spondylitid.      ROM / Strength   AROM / PROM / Strength  AROM      AROM   AROM Assessment Site  Cervical    Cervical Flexion  40    Cervical Extension  5    Cervical - Right Side Bend  5    Cervical - Left Side Bend  5    Cervical - Right Rotation  40    Cervical - Left Rotation  42                   [facility] Adult PT Treatment/Exercise - 04/20/18 0001      Exercises   Exercises  Neck;Lumbar      Neck Exercises: Machines for Strengthening   UBE (Upper Arm Bike)  120 RPM x8 minutes (x4 fwd, x4 bwd) standing      Neck Exercises: Theraband   Horizontal ABduction  10 reps;Red      Neck Exercises: Standing   Neck Retraction  10 reps;3 secs;Other (comment)   with folded pillow for tactile cue     Neck Exercises: Seated  Cervical Rotation  Both;20 reps   3 second hold   Upper Extremity D2  Flexion;20 reps   following with head, bilaterally     Lumbar Exercises: Seated   Sit to Stand  20 reps   2x10 1 set with 1 UE support, then no UE support   Other Seated Lumbar Exercises  thoracic extension x20 with pillow      Neck Exercises: Stretches   Corner Stretch  30 seconds;5 reps               PT Short Term Goals - 03/19/18 1300      PT SHORT TERM GOAL #1   Title  STG's=LTG's.        PT Long Term Goals - 04/20/18 1047      PT LONG TERM GOAL #1   Title  Bilateral active cervical rotation= 55. degrees so he can turn his head more easiliy while driving.    Time  6    Period  Weeks    Status  On-going   42 degrees right, 40 degreees left     PT LONG TERM GOAL #2   Title  Patient able to improve forward head posture by 1/2 to 1 inch.    Time  8    Period  Weeks    Status  On-going   2 inches     PT LONG TERM GOAL #3   Status  --            Plan - 04/20/18 1102    Clinical Impression  Statement  Patient was able to tolerate treatment well with no reports of decreased stiffness at end of session. Patient required intermittent tactile cues for form. Patient required CG assist to prevent loss of balance during sit to stands without UE support. Patient's goals are ongoing; requesting 3 additional visits to address ongoing deficits and for patient to be independent with HEP. Patient reported understanding.     Clinical Presentation  Evolving    Clinical Decision Making  Moderate    Rehab Potential  Good    PT Frequency  --   3 visits   PT Treatment/Interventions  ADLs/Self Care Home Management;Therapeutic activities;Therapeutic exercise;Balance training;Neuromuscular re-education;Patient/family education    PT Next Visit Plan  Work on improving posture; scapular strengthening.    Consulted and Agree with Plan of Care  Patient       Patient will benefit from skilled therapeutic intervention in order to improve the following deficits and impairments:  Abnormal gait, Decreased activity tolerance, Decreased range of motion, Decreased strength, Postural dysfunction, Decreased balance  Visit Diagnosis: Cervicalgia  Abnormal posture     Problem List Patient Active Problem List   Diagnosis Date Noted  . Ankylosing spondylitis of cervicothoracic region ([place]) 10/16/2016  . TIA (transient ischemic attack) 08/10/2015  . Carotid stenosis 08/04/2014  . Hyperlipidemia 02/21/2012  . BENIGN PROSTATIC HYPERTROPHY, WITH OBSTRUCTION 01/15/2010  . Essential hypertension 11/10/2009  . GERD 02/22/2008  . NEPHROLITHIASIS, HX OF 11/17/2007   Gabriela Eves, PT, DPT 04/20/2018, 12:32 PM  [facility] [address] [place], [place], [address] Phone: [phone]   Fax:  [phone]  Name: [name] MRN: [id number] Date of Birth: 1936/08/06

## 2018-04-30 ENCOUNTER — Encounter: Payer: Self-pay | Admitting: Physical Therapy

## 2018-04-30 ENCOUNTER — Ambulatory Visit: Payer: Medicare Other | Attending: Rheumatology | Admitting: Physical Therapy

## 2018-04-30 DIAGNOSIS — R278 Other lack of coordination: Secondary | ICD-10-CM | POA: Diagnosis not present

## 2018-04-30 DIAGNOSIS — M25612 Stiffness of left shoulder, not elsewhere classified: Secondary | ICD-10-CM | POA: Diagnosis not present

## 2018-04-30 DIAGNOSIS — R29898 Other symptoms and signs involving the musculoskeletal system: Secondary | ICD-10-CM | POA: Diagnosis not present

## 2018-04-30 DIAGNOSIS — M542 Cervicalgia: Secondary | ICD-10-CM | POA: Insufficient documentation

## 2018-04-30 DIAGNOSIS — R293 Abnormal posture: Secondary | ICD-10-CM | POA: Insufficient documentation

## 2018-04-30 DIAGNOSIS — M546 Pain in thoracic spine: Secondary | ICD-10-CM

## 2018-04-30 DIAGNOSIS — M25611 Stiffness of right shoulder, not elsewhere classified: Secondary | ICD-10-CM | POA: Diagnosis not present

## 2018-04-30 NOTE — Therapy (Signed)
[facility] [place], [place], [address] Phone: [phone]   Fax:  [phone]  Physical Therapy Treatment  Patient Details  Name: [name] MRN: [id number] Date of Birth: [birth year] Referring Provider: Nancy Nordmann MD   Encounter Date: 04/30/2018  PT End of Session - 04/30/18 1039    Visit Number  4    Number of Visits  6    Date for PT Re-Evaluation  05/15/18    PT Start Time  1030    PT Stop Time  1115    PT Time Calculation (min)  45 min    Activity Tolerance  Patient tolerated treatment well    Behavior During Therapy  [facility] for tasks assessed/performed       Past Medical History:  Diagnosis Date  . Ankylosing spondylitis ([place]) dx'd ~ 1974  . Arthritis    "back" (08/10/2015)  . BENIGN PROSTATIC HYPERTROPHY, WITH OBSTRUCTION 01/15/2010  . Bleeding duodenal ulcer   . Bleeding esophageal ulcer   . Bleeding stomach ulcer   . GERD 02/22/2008  . History of blood transfusion 1988   "lost ~ 1/2 of my blood volume from multiple bleeding ulcers"  . History of hiatal hernia   . HYPERGLYCEMIA 11/18/2007  . HYPERLIPIDEMIA 02/22/2008  . HYPERTENSION, UNSPECIFIED 11/10/2009  . Kidney stones   . Osteoarthritis    c-spine  . PEPTIC ULCER DISEASE 11/17/2007  . Situational depression    "son died in [place] 06-30-2015"  . TIA (transient ischemic attack)    "not that I know of in the past; they are trying to determine if I've had one today (08/10/2015)"    Past Surgical History:  Procedure Laterality Date  . CATARACT EXTRACTION W/ INTRAOCULAR LENS  IMPLANT, BILATERAL Bilateral 2013    There were no vitals filed for this visit.  Subjective Assessment - 04/30/18 1038    Subjective  No pain reported only stiffness noted in his neck    Pertinent History  Arhtritis; anklosing spondylitis; possible TIA.  Long term right ankle injury.    How long can you walk comfortably?  Short distances around community.    Patient Stated Goals   Improve posture.    Currently in Pain?  No/denies                       [facility] Adult PT Treatment/Exercise - 04/30/18 0001      Exercises   Exercises  Neck;Lumbar      Neck Exercises: Machines for Strengthening   UBE (Upper Arm Bike)  120 RPM x8 minutes (x4 fwd, x4 bwd) standing      Neck Exercises: Theraband   Rows  20 reps;Red    Horizontal ABduction  10 reps;Red      Neck Exercises: Standing   Neck Retraction  10 reps;3 secs;Other (comment)   with folded pillow for tactile cue     Neck Exercises: Seated   Cervical Rotation  Both;20 reps   3 second hold   Upper Extremity D2  Flexion;20 reps   following with head, bilaterally     Lumbar Exercises: Seated   Sit to Stand  20 reps   2x10 1 set with 1 UE support, then no UE support   Other Seated Lumbar Exercises  thoracic extension x20 with pillow      Manual Therapy   Manual Therapy  Soft tissue mobilization;Scapular mobilization    Soft tissue mobilization  IASTM  to levator, medial scapular border  Scapular Mobilization  medial / lateral, anterior/superiorly      Neck Exercises: Stretches   Corner Stretch  5 reps;20 seconds             PT Education - 04/30/18 1039    Education Details  posture correction, corner stretches    Person(s) Educated  Patient    Methods  Explanation;Demonstration    Comprehension  Verbalized understanding;Returned demonstration       PT Short Term Goals - 04/30/18 1041      PT SHORT TERM GOAL #1   Title  STG's=LTG's.        PT Long Term Goals - 04/30/18 1041      PT LONG TERM GOAL #1   Title  Bilateral active cervical rotation= 55. degrees so he can turn his head more easiliy while driving.    Time  6    Period  Weeks    Status  On-going      PT LONG TERM GOAL #2   Title  Patient able to improve forward head posture by 1/2 to 1 inch.    Time  8    Period  Weeks    Status  On-going      PT LONG TERM GOAL #3   Title  Perform ADL's with pain not >  3/10.    Time  8    Period  Weeks    Status  Achieved      PT LONG TERM GOAL #4   Title  Eliminate headaches.    Time  8    Period  Weeks    Status  Achieved            Plan - 04/30/18 1040    Clinical Impression Statement  Pt able to tolerate all treatment well with no reports on increased pain. Pt did report less stiffness at the end of session. Pt still requiring verbal instructions for posture corrections and exercises. Continue skilled PT to progress toward goals set.     Rehab Potential  Good    PT Treatment/Interventions  ADLs/Self Care Home Management;Therapeutic activities;Therapeutic exercise;Balance training;Neuromuscular re-education;Patient/family education    PT Next Visit Plan  Work on improving posture; scapular strengthening.    Consulted and Agree with Plan of Care  Patient       Patient will benefit from skilled therapeutic intervention in order to improve the following deficits and impairments:  Abnormal gait, Decreased activity tolerance, Decreased range of motion, Decreased strength, Postural dysfunction, Decreased balance  Visit Diagnosis: Cervicalgia  Abnormal posture  Other symptoms and signs involving the musculoskeletal system  Other lack of coordination  Stiffness of left shoulder, not elsewhere classified  Stiffness of right shoulder, not elsewhere classified  Pain in thoracic spine     Problem List Patient Active Problem List   Diagnosis Date Noted  . Ankylosing spondylitis of cervicothoracic region ([place]) 10/16/2016  . TIA (transient ischemic attack) 08/10/2015  . Carotid stenosis 08/04/2014  . Hyperlipidemia 02/21/2012  . BENIGN PROSTATIC HYPERTROPHY, WITH OBSTRUCTION 01/15/2010  . Essential hypertension 11/10/2009  . GERD 02/22/2008  . NEPHROLITHIASIS, HX OF 11/17/2007    [name] 04/30/2018, 11:31 AM  [facility] [address] [place], [place], [address] Phone:  [phone]   Fax:  [phone]  Name: [name] MRN: [id number] Date of Birth: [birth year]

## 2018-05-06 ENCOUNTER — Encounter: Payer: Self-pay | Admitting: Physical Therapy

## 2018-05-06 ENCOUNTER — Ambulatory Visit: Payer: Medicare Other | Admitting: Physical Therapy

## 2018-05-06 DIAGNOSIS — M542 Cervicalgia: Secondary | ICD-10-CM | POA: Diagnosis not present

## 2018-05-06 DIAGNOSIS — R293 Abnormal posture: Secondary | ICD-10-CM | POA: Diagnosis not present

## 2018-05-06 DIAGNOSIS — R278 Other lack of coordination: Secondary | ICD-10-CM | POA: Diagnosis not present

## 2018-05-06 DIAGNOSIS — M25612 Stiffness of left shoulder, not elsewhere classified: Secondary | ICD-10-CM | POA: Diagnosis not present

## 2018-05-06 DIAGNOSIS — R29898 Other symptoms and signs involving the musculoskeletal system: Secondary | ICD-10-CM | POA: Diagnosis not present

## 2018-05-06 DIAGNOSIS — M25611 Stiffness of right shoulder, not elsewhere classified: Secondary | ICD-10-CM | POA: Diagnosis not present

## 2018-05-06 NOTE — Therapy (Signed)
[facility] [place], [place], [address] Phone: [phone]   Fax:  [phone]  Physical Therapy Treatment  Patient Details  Name: [name] MRN: [id number] Date of Birth: 1936-01-13 Referring Provider: Nancy Nordmann MD   Encounter Date: 05/06/2018  PT End of Session - 05/06/18 1055    Visit Number  5    Number of Visits  6    Date for PT Re-Evaluation  05/15/18    PT Start Time  1031    PT Stop Time  1113    PT Time Calculation (min)  42 min    Activity Tolerance  Patient tolerated treatment well    Behavior During Therapy  [facility] for tasks assessed/performed       Past Medical History:  Diagnosis Date  . Ankylosing spondylitis ([place]) dx'd ~ 1974  . Arthritis    "back" (08/10/2015)  . BENIGN PROSTATIC HYPERTROPHY, WITH OBSTRUCTION 01/15/2010  . Bleeding duodenal ulcer   . Bleeding esophageal ulcer   . Bleeding stomach ulcer   . GERD 02/22/2008  . History of blood transfusion 1988   "lost ~ 1/2 of my blood volume from multiple bleeding ulcers"  . History of hiatal hernia   . HYPERGLYCEMIA 11/18/2007  . HYPERLIPIDEMIA 02/22/2008  . HYPERTENSION, UNSPECIFIED 11/10/2009  . Kidney stones   . Osteoarthritis    c-spine  . PEPTIC ULCER DISEASE 11/17/2007  . Situational depression    "son died in [place] 2015-07-05"  . TIA (transient ischemic attack)    "not that I know of in the past; they are trying to determine if I've had one today (08/10/2015)"    Past Surgical History:  Procedure Laterality Date  . CATARACT EXTRACTION W/ INTRAOCULAR LENS  IMPLANT, BILATERAL Bilateral 2013    There were no vitals filed for this visit.  Subjective Assessment - 05/06/18 1034    Subjective  some ongoing stiffness per patient    Pertinent History  Arhtritis; anklosing spondylitis; possible TIA.  Long term right ankle injury.    How long can you walk comfortably?  Short distances around community.    Patient Stated Goals  Improve  posture.    Currently in Pain?  No/denies         [facility] PT Assessment - 05/06/18 0001      AROM   AROM Assessment Site  Cervical    Cervical - Right Rotation  42    Cervical - Left Rotation  49                   [facility] Adult PT Treatment/Exercise - 05/06/18 0001      Exercises   Exercises  Neck;Lumbar;Shoulder      Neck Exercises: Machines for Strengthening   UBE (Upper Arm Bike)  120 RPM x8 minutes (x4 fwd, x4 bwd) standing      Neck Exercises: Standing   Neck Retraction  20 reps;Other reps (comment);Limitations;Other (comment)    Neck Retraction Limitations  with towel behind head for cues and scap retraction with exercise      Neck Exercises: Supine   Neck Retraction  Other reps (comment);Limitations;Other (comment)    Neck Retraction Limitations  supine with towel for cues under thoracic and neck for stretch and heat on front shoulders with manual cues and overpressure stretch x59min      Shoulder Exercises: Seated   Other Seated Exercises  seated ext with boulster for cues x30    Other Seated Exercises  seated retraction  with towel for cues x30      Shoulder Exercises: Standing   Horizontal ABduction  Strengthening;Both;20 reps;Limitations;Other (comment)    Theraband Level (Shoulder Horizontal ABduction)  Level 2 (Red)    Horizontal ABduction Limitations  with cervical retraction     Diagonals  Strengthening;Both;20 reps;Theraband;Limitations;Other (comment)    Theraband Level (Shoulder Diagonals)  Level 1 (Yellow)    Diagonals Limitations  with cervical retraction      Shoulder Exercises: ROM/Strengthening   Wall Pushups  20 reps      Shoulder Exercises: Stretch   Corner Stretch  5 reps;10 seconds;3 reps               PT Short Term Goals - 04/30/18 1041      PT SHORT TERM GOAL #1   Title  STG's=LTG's.        PT Long Term Goals - 05/06/18 1054      PT LONG TERM GOAL #1   Title  Bilateral active cervical rotation= 55. degrees so he  can turn his head more easiliy while driving.    Time  6    Period  Weeks    Status  On-going      PT LONG TERM GOAL #2   Title  Patient able to improve forward head posture by 1/2 to 1 inch.    Time  8    Period  Weeks    Status  On-going      PT LONG TERM GOAL #3   Title  Perform ADL's with pain not > 3/10.    Time  8    Period  Weeks    Status  Achieved      PT LONG TERM GOAL #4   Title  Eliminate headaches.    Time  8    Period  Weeks    Status  Achieved            Plan - 05/06/18 1107    Clinical Impression Statement  Patient tolerated treatment well today. Patient able to progress with cervical and posture exercises today. Patient required educational cues for technique. Patient has improved with bil cervical rotation today for right and left. Goals ongoing at this time. DC next visit to HEP.    Rehab Potential  Good    PT Treatment/Interventions  ADLs/Self Care Home Management;Therapeutic activities;Therapeutic exercise;Balance training;Neuromuscular re-education;Patient/family education    PT Next Visit Plan  HEP for improving posture; scapular strengthening and DC to HEP/gym program ( review gym equiptment)    Consulted and Agree with Plan of Care  Patient       Patient will benefit from skilled therapeutic intervention in order to improve the following deficits and impairments:  Abnormal gait, Decreased activity tolerance, Decreased range of motion, Decreased strength, Postural dysfunction, Decreased balance  Visit Diagnosis: Cervicalgia  Abnormal posture     Problem List Patient Active Problem List   Diagnosis Date Noted  . Ankylosing spondylitis of cervicothoracic region ([place]) 10/16/2016  . TIA (transient ischemic attack) 08/10/2015  . Carotid stenosis 08/04/2014  . Hyperlipidemia 02/21/2012  . BENIGN PROSTATIC HYPERTROPHY, WITH OBSTRUCTION 01/15/2010  . Essential hypertension 11/10/2009  . GERD 02/22/2008  . NEPHROLITHIASIS, HX OF 11/17/2007     Phillips Climes, PTA 05/06/2018, 11:17 AM  [facility] [place], [place], [address] Phone: [phone]   Fax:  [phone]  Name: COREON SIMKINS MRN: [id number] Date of Birth: 1936/05/31

## 2018-05-08 ENCOUNTER — Telehealth: Payer: Self-pay | Admitting: Internal Medicine

## 2018-05-08 NOTE — Telephone Encounter (Signed)
Left detailed message informing pt of update. 

## 2018-05-08 NOTE — Telephone Encounter (Signed)
Copied from [place] [id number]. Topic: Quick Communication - Rx Refill/Question >> May 08, 2018  3:50 PM Neva Seat wrote: Pt needing to know if Dr. Raliegh Ip can prescribe him 3 months supply of levothyroxine (SYNTHROID, LEVOTHROID) 25 MCG tablet.  He will not be able to find another doctor until after January. Please call pt back to let him know on this asap.

## 2018-05-11 MED ORDER — LEVOTHYROXINE SODIUM 25 MCG PO TABS: 25.0000 ug | ORAL_TABLET | Freq: Every day | ORAL | 3 refills | Status: DC

## 2018-05-11 NOTE — Telephone Encounter (Signed)
Levothyroxine 0.025 mg #90 refill x3

## 2018-05-11 NOTE — Telephone Encounter (Signed)
Rx sent in and pt notified 

## 2018-05-14 ENCOUNTER — Encounter: Payer: Self-pay | Admitting: Physical Therapy

## 2018-05-14 ENCOUNTER — Ambulatory Visit: Payer: Medicare Other | Admitting: Physical Therapy

## 2018-05-14 DIAGNOSIS — M542 Cervicalgia: Secondary | ICD-10-CM

## 2018-05-14 DIAGNOSIS — R29898 Other symptoms and signs involving the musculoskeletal system: Secondary | ICD-10-CM | POA: Diagnosis not present

## 2018-05-14 DIAGNOSIS — M25612 Stiffness of left shoulder, not elsewhere classified: Secondary | ICD-10-CM | POA: Diagnosis not present

## 2018-05-14 DIAGNOSIS — R278 Other lack of coordination: Secondary | ICD-10-CM | POA: Diagnosis not present

## 2018-05-14 DIAGNOSIS — R293 Abnormal posture: Secondary | ICD-10-CM

## 2018-05-14 DIAGNOSIS — M25611 Stiffness of right shoulder, not elsewhere classified: Secondary | ICD-10-CM | POA: Diagnosis not present

## 2018-05-14 NOTE — Therapy (Signed)
[facility] [place], [place], [address] Phone: [phone]   Fax:  [phone]  Physical Therapy Treatment/Discharge  Patient Details  Name: [name] MRN: [id number] Date of Birth: 17-Mar-1936 Referring Provider: Nancy Nordmann MD   Encounter Date: 05/14/2018  PT End of Session - 05/14/18 1034    Visit Number  6    Number of Visits  6    Date for PT Re-Evaluation  05/15/18    PT Start Time  1033    Activity Tolerance  Patient tolerated treatment well    Behavior During Therapy  [facility] for tasks assessed/performed       Past Medical History:  Diagnosis Date  . Ankylosing spondylitis ([place]) dx'd ~ 1974  . Arthritis    "back" (08/10/2015)  . BENIGN PROSTATIC HYPERTROPHY, WITH OBSTRUCTION 01/15/2010  . Bleeding duodenal ulcer   . Bleeding esophageal ulcer   . Bleeding stomach ulcer   . GERD 02/22/2008  . History of blood transfusion 1988   "lost ~ 1/2 of my blood volume from multiple bleeding ulcers"  . History of hiatal hernia   . HYPERGLYCEMIA 11/18/2007  . HYPERLIPIDEMIA 02/22/2008  . HYPERTENSION, UNSPECIFIED 11/10/2009  . Kidney stones   . Osteoarthritis    c-spine  . PEPTIC ULCER DISEASE 11/17/2007  . Situational depression    "son died in [place] 2015/06/28"  . TIA (transient ischemic attack)    "not that I know of in the past; they are trying to determine if I've had one today (08/10/2015)"    Past Surgical History:  Procedure Laterality Date  . CATARACT EXTRACTION W/ INTRAOCULAR LENS  IMPLANT, BILATERAL Bilateral 2013    There were no vitals filed for this visit.  Subjective Assessment - 05/14/18 1034    Subjective  Patient reported feeling "alright"    Pertinent History  Arhtritis; anklosing spondylitis; possible TIA.  Long term right ankle injury.    How long can you walk comfortably?  Short distances around community.    Patient Stated Goals  Improve posture.    Currently in Pain?  No/denies          [facility] PT Assessment - 05/14/18 0001      Assessment   Medical Diagnosis  Ankylosing spondylitid.      AROM   AROM Assessment Site  Cervical    Cervical - Right Rotation  44    Cervical - Left Rotation  48                   [facility] Adult PT Treatment/Exercise - 05/14/18 0001      Exercises   Exercises  Neck;Lumbar;Shoulder      Neck Exercises: Machines for Strengthening   UBE (Upper Arm Bike)  120 RPM x10 minutes (x4 fwd, x4 bwd) standing      Neck Exercises: Theraband   Rows  20 reps    Rows Limitations  Pink XTS      Shoulder Exercises: ROM/Strengthening   Wall Pushups  20 reps      Shoulder Exercises: Stretch   Other Shoulder Stretches  doorway stretch with arms down 15" hold x10      Neck Exercises: Land  5 reps;Other (comment)   15" hold x10              PT Short Term Goals - 04/30/18 1041      PT SHORT TERM GOAL #1   Title  STG's=LTG's.  PT Long Term Goals - 05/14/18 1055      PT LONG TERM GOAL #1   Title  Bilateral active cervical rotation= 55. degrees so he can turn his head more easiliy while driving.    Time  6    Period  Weeks    Status  Not Met      PT LONG TERM GOAL #2   Title  Patient able to improve forward head posture by 1/2 to 1 inch.    Time  8    Period  Weeks    Status  Not Met            Plan - 05/14/18 1105    Clinical Impression Statement  Patient was able to complete treatment well. Patient was able to perform exercises without cuing. Patient's goals were not met today. Patient instructed to continue HEP to maintain gains from physical therapy. Patient provided with overview of exercise equipment provided in the gym program. Patient reported understanding. Patient discharged at this time.    Clinical Presentation  Evolving    Clinical Decision Making  Moderate    Rehab Potential  Good    PT Treatment/Interventions  ADLs/Self Care Home Management;Therapeutic  activities;Therapeutic exercise;Balance training;Neuromuscular re-education;Patient/family education    PT Next Visit Plan  DC at this time    Consulted and Agree with Plan of Care  Patient       Patient will benefit from skilled therapeutic intervention in order to improve the following deficits and impairments:  Abnormal gait, Decreased activity tolerance, Decreased range of motion, Decreased strength, Postural dysfunction, Decreased balance  Visit Diagnosis: Cervicalgia  Abnormal posture     Problem List Patient Active Problem List   Diagnosis Date Noted  . Ankylosing spondylitis of cervicothoracic region ([place]) 10/16/2016  . TIA (transient ischemic attack) 08/10/2015  . Carotid stenosis 08/04/2014  . Hyperlipidemia 02/21/2012  . BENIGN PROSTATIC HYPERTROPHY, WITH OBSTRUCTION 01/15/2010  . Essential hypertension 11/10/2009  . GERD 02/22/2008  . NEPHROLITHIASIS, HX OF 11/17/2007   PHYSICAL THERAPY DISCHARGE SUMMARY  Visits from Start of Care: 6  Current functional level related to goals / functional outcomes: See above   Remaining deficits: Goals not met   Education / Equipment: HEP Plan: Patient agrees to discharge.  Patient goals were met. Patient is being discharged due to lack of progress.  ?????       Gabriela Eves, PT, DPT 05/14/2018, 12:43 PM  [facility] [address] [place], [place], [address] Phone: [phone]   Fax:  [phone]  Name: [name] MRN: [id number] Date of Birth: 1936/05/02

## 2018-05-21 ENCOUNTER — Other Ambulatory Visit: Payer: Self-pay | Admitting: Internal Medicine

## 2018-07-02 DIAGNOSIS — C4442 Squamous cell carcinoma of skin of scalp and neck: Secondary | ICD-10-CM | POA: Diagnosis not present

## 2018-07-02 DIAGNOSIS — D485 Neoplasm of uncertain behavior of skin: Secondary | ICD-10-CM | POA: Diagnosis not present

## 2018-07-02 DIAGNOSIS — L819 Disorder of pigmentation, unspecified: Secondary | ICD-10-CM | POA: Diagnosis not present

## 2018-07-02 DIAGNOSIS — L570 Actinic keratosis: Secondary | ICD-10-CM | POA: Diagnosis not present

## 2018-07-10 DIAGNOSIS — R1319 Other dysphagia: Secondary | ICD-10-CM | POA: Diagnosis not present

## 2018-07-10 DIAGNOSIS — M150 Primary generalized (osteo)arthritis: Secondary | ICD-10-CM | POA: Diagnosis not present

## 2018-07-10 DIAGNOSIS — M436 Torticollis: Secondary | ICD-10-CM | POA: Diagnosis not present

## 2018-07-10 DIAGNOSIS — Z79899 Other long term (current) drug therapy: Secondary | ICD-10-CM | POA: Diagnosis not present

## 2018-07-10 DIAGNOSIS — Z6823 Body mass index (BMI) 23.0-23.9, adult: Secondary | ICD-10-CM | POA: Diagnosis not present

## 2018-07-10 DIAGNOSIS — M545 Low back pain: Secondary | ICD-10-CM | POA: Diagnosis not present

## 2018-07-10 DIAGNOSIS — M459 Ankylosing spondylitis of unspecified sites in spine: Secondary | ICD-10-CM | POA: Diagnosis not present

## 2018-07-10 DIAGNOSIS — E039 Hypothyroidism, unspecified: Secondary | ICD-10-CM | POA: Diagnosis not present

## 2018-07-10 DIAGNOSIS — R251 Tremor, unspecified: Secondary | ICD-10-CM | POA: Diagnosis not present

## 2018-07-13 DIAGNOSIS — Z23 Encounter for immunization: Secondary | ICD-10-CM | POA: Diagnosis not present

## 2018-08-04 DIAGNOSIS — M79676 Pain in unspecified toe(s): Secondary | ICD-10-CM | POA: Diagnosis not present

## 2018-08-04 DIAGNOSIS — B351 Tinea unguium: Secondary | ICD-10-CM | POA: Diagnosis not present

## 2018-08-28 DIAGNOSIS — H6121 Impacted cerumen, right ear: Secondary | ICD-10-CM | POA: Diagnosis not present

## 2018-08-28 DIAGNOSIS — Z1389 Encounter for screening for other disorder: Secondary | ICD-10-CM | POA: Diagnosis not present

## 2018-08-28 DIAGNOSIS — R05 Cough: Secondary | ICD-10-CM | POA: Diagnosis not present

## 2018-08-28 DIAGNOSIS — K219 Gastro-esophageal reflux disease without esophagitis: Secondary | ICD-10-CM | POA: Diagnosis not present

## 2018-08-28 DIAGNOSIS — M459 Ankylosing spondylitis of unspecified sites in spine: Secondary | ICD-10-CM | POA: Diagnosis not present

## 2018-08-28 DIAGNOSIS — I10 Essential (primary) hypertension: Secondary | ICD-10-CM | POA: Diagnosis not present

## 2018-08-28 DIAGNOSIS — I6523 Occlusion and stenosis of bilateral carotid arteries: Secondary | ICD-10-CM | POA: Diagnosis not present

## 2018-08-28 DIAGNOSIS — Z6827 Body mass index (BMI) 27.0-27.9, adult: Secondary | ICD-10-CM | POA: Diagnosis not present

## 2018-08-28 DIAGNOSIS — E7849 Other hyperlipidemia: Secondary | ICD-10-CM | POA: Diagnosis not present

## 2018-08-28 DIAGNOSIS — N400 Enlarged prostate without lower urinary tract symptoms: Secondary | ICD-10-CM | POA: Diagnosis not present

## 2018-08-31 DIAGNOSIS — L570 Actinic keratosis: Secondary | ICD-10-CM | POA: Diagnosis not present

## 2018-08-31 DIAGNOSIS — L819 Disorder of pigmentation, unspecified: Secondary | ICD-10-CM | POA: Diagnosis not present

## 2018-09-16 ENCOUNTER — Telehealth: Payer: Self-pay | Admitting: Neurology

## 2018-09-16 ENCOUNTER — Encounter

## 2018-09-16 ENCOUNTER — Telehealth: Payer: Self-pay

## 2018-09-16 ENCOUNTER — Encounter: Payer: Self-pay | Admitting: Neurology

## 2018-09-16 ENCOUNTER — Ambulatory Visit (INDEPENDENT_AMBULATORY_CARE_PROVIDER_SITE_OTHER): Payer: Medicare Other | Admitting: Neurology

## 2018-09-16 VITALS — BP 176/77 | HR 81 | Ht 66.5 in | Wt 148.0 lb

## 2018-09-16 DIAGNOSIS — G20C Parkinsonism, unspecified: Secondary | ICD-10-CM

## 2018-09-16 DIAGNOSIS — G20 Parkinson's disease: Secondary | ICD-10-CM

## 2018-09-16 NOTE — Telephone Encounter (Signed)
DAT scan consent for insurance authorization was completed and signed by the pt. Please process as appropriate.

## 2018-09-16 NOTE — Patient Instructions (Addendum)
Fall risk is real! Please remember to stand up slowly and get your bearings first turn slowly, no bending down to pick anything, no heavy lifting, be extra careful at night and first thing in the morning. Also, be careful in the Bathroom and the kitchen, especially at night.  I think you have signs and symptoms of mild parkinsonism, possibly left sided Parkinson's disease.   Please continue with your medications and follow up with rheumatology and your meds through them.   I may ask you to start a new medication for parkinson's, we will await your scan.  I do want to suggest a few things today:  Please be really proactive about constipation issues.   Remember to drink plenty of fluid at least 6 glasses (8 oz each), eat healthy meals and do not skip any meals. Try to eat protein with a every meal and eat a healthy snack such as fruit or nuts in between meals. Try to keep a regular sleep-wake schedule and try to exercise daily, particularly in the form of walking, 20-30 minutes a day, if you can.   Try to stay active physically and mentally. Engage in social activities in your community and with your family and try to keep up with current events by reading the newspaper or watching the news. Try to do word puzzles and you may like to do puzzles and brain games on the computer such as on [email or web address]   As far as diagnostic testing, I would like for your to consider a DaT scan: This is a specialized brain scan designed to help with diagnosis of tremor disorders. A radioactive marker gets injected and the uptake is measured in the brain and compared to normal controls and right side is compared to the left, a change in uptake can help with diagnosis of certain tremor disorders. A brain MRI on the other hand is a brain scan that helps look at the brain structure in more detail overall and look for age-related changes, blood vessel related changes and look for stroke and volume loss which we call atrophy.    You had a brain MRI in the past.   I would like to see you back in 3 months, sooner if we need to. Please call us with any interim questions, concerns, problems, updates or refill requests.  Our phone number is [phone]. We also have an after hours call service for urgent matters and there is a physician on-call for urgent questions, that cannot wait till the next work day. For any emergencies you know to call 911 or go to the nearest emergency room.

## 2018-09-16 NOTE — Progress Notes (Signed)
Subjective:    Patient ID: [name] is a 83 y.o. male.  HPI     Casey Age, MD, PhD [facility] [address], [address] [place], [place] [address]  Dear [name],  I saw your patient, [name], upon your kind request in my neurologic clinic today for initial consultation of his tremors. The patient is accompanied by his wife today. As you know, [name] is a 83 year old right-handed gentleman with an underlying complex medical history of peptic ulcer disease with history of upper GI bleed, TIA, depression, degenerative disc disease in the neck, osteoarthritis, kidney stones, hyperlipidemia, hypertension, history of hiatal hernia, BPH, arthritis, including ankylosing spondylitis, for which he sees you in rheumatology, who reports bilateral hand tremor, left more than right for the past at least one year with some progression noted. His wife has noted significant impairment in his mobility this time. He has a left more than right tremor, the left hand is the most significant. He has not noticed any lower extremity tremor. He does have restriction in his shoulder mobility, left more than right. He has no family history of Parkinson's disease, does not know much about his mother's history her family history, she died at 21, when he was 83 years old. His paternal grandmother lived to be above 78 years old. He had a sister who died young at 31 from lupus, his brother has significant arthritis issues at [name] 88. He injured his right ankle and foot when he was a child and continues to have restricted movements in his right ankle. He has fallen a few times. He fell one or 2 times in the last 6 months, one time he fell in the bathroom and one time he fell in the closet. He has had intermittent numbness in both hands, particularly the right hand, particularly with certain movements or posture, such as while holding the steering wheel.   I reviewed your office note from  07/10/2018, which you kindly included. He has also had some balance issues. For his arthritis he takes Cosentyx injections monthly. Of note, he had a Casey MRI without contrast as well as MRA head without contrast on 08/10/2015 and I reviewed the results: IMPRESSION: MRI HEAD IMPRESSION:   Negative Casey MRI with no acute intracranial infarct or other process identified.   MRA HEAD IMPRESSION:   Negative intracranial MRA. No large or proximal arterial branch occlusion. No high-grade or correctable stenosis.  He is retired, lives with his wife, he quit smoking and drinks alcohol about once a week, caffeine in the form of coffee, 2-3 cups per day on average.   His Past Medical History Is Significant For: Past Medical History:  Diagnosis Date  . Ankylosing spondylitis ([name]) dx'd ~ 1974  . Arthritis    "back" (08/10/2015)  . BENIGN PROSTATIC HYPERTROPHY, WITH OBSTRUCTION 01/15/2010  . Bleeding duodenal ulcer   . Bleeding esophageal ulcer   . Bleeding stomach ulcer   . GERD 02/22/2008  . History of blood transfusion 1988   "lost ~ 1/2 of my blood volume from multiple bleeding ulcers"  . History of hiatal hernia   . HYPERGLYCEMIA 11/18/2007  . HYPERLIPIDEMIA 02/22/2008  . HYPERTENSION, UNSPECIFIED 11/10/2009  . Kidney stones   . Osteoarthritis    c-spine  . PEPTIC ULCER DISEASE 11/17/2007  . Situational depression    "son died in [place] 2015/07/07"  . TIA (transient ischemic attack)    "not that I know of in the past;  they are trying to determine if I've had one today (08/10/2015)"    His Past Surgical History Is Significant For: Past Surgical History:  Procedure Laterality Date  . CATARACT EXTRACTION W/ INTRAOCULAR LENS  IMPLANT, BILATERAL Bilateral 2013    His Family History Is Significant For: Family History  Problem Relation [name] of Onset  . Appendicitis Mother        complications  . Colon cancer Father   . Diabetes type II Father   . Lupus Sister   . Colon cancer Other         1st degree relative <60    His Social History Is Significant For: Social History   Socioeconomic History  . Marital status: Married    Spouse name: Not on file  . Number of children: Not on file  . Years of education: Not on file  . Highest education level: Not on file  Occupational History  . Not on file  Social Needs  . Financial resource strain: Not on file  . Food insecurity:    Worry: Not on file    Inability: Not on file  . Transportation needs:    Medical: Not on file    Non-medical: Not on file  Tobacco Use  . Smoking status: Former Smoker    Years: 15.00    Types: Cigars, Pipe    Last attempt to quit: 08/26/1978    Years since quitting: 40.0  . Smokeless tobacco: Never Used  Substance and Sexual Activity  . Alcohol use: Yes    Comment: 08/10/2015 "glass of wine ~ month or so; occasionally a can of beer"  . Drug use: No  . Sexual activity: Yes  Lifestyle  . Physical activity:    Days per week: Not on file    Minutes per session: Not on file  . Stress: Not on file  Relationships  . Social connections:    Talks on phone: Not on file    Gets together: Not on file    Attends religious service: Not on file    Active member of club or organization: Not on file    Attends meetings of clubs or organizations: Not on file    Relationship status: Not on file  Other Topics Concern  . Not on file  Social History Narrative   Retired and married.     His Allergies Are:  No Known Allergies:   His Current Medications Are:  Outpatient Encounter Medications as of 09/16/2018  Medication Sig  . aspirin 81 MG tablet Take 81 mg by mouth daily.    Marland Kitchen atorvastatin (LIPITOR) 10 MG tablet TAKE 1 TABLET BY MOUTH  DAILY  . Cholecalciferol (VITAMIN D PO) Take 1,000 Units by mouth daily.  . famotidine (PEPCID) 20 MG tablet Take 20 mg by mouth 2 (two) times daily.   . fluorouracil (EFUDEX) 5 % cream Apply topically.  Marland Kitchen levothyroxine (SYNTHROID, LEVOTHROID) 25 MCG tablet Take 1  tablet (25 mcg total) by mouth daily before breakfast.  . lisinopril-hydrochlorothiazide (PRINZIDE,ZESTORETIC) 20-12.5 MG tablet Take 0.5 tablets by mouth daily.  . meloxicam (MOBIC) 15 MG tablet TAKE 1 TABLET BY MOUTH  DAILY  . Multiple Vitamin (MULTIVITAMIN WITH MINERALS) TABS tablet Take 1 tablet by mouth daily.  . nitroGLYCERIN (NITROSTAT) 0.4 MG SL tablet Place 1 tablet (0.4 mg total) under the tongue every 5 (five) minutes as needed.  . Secukinumab (COSENTYX [place]) Inject into the skin.  . [DISCONTINUED] fish oil-omega-3 fatty acids 1000 MG capsule Take 1  g by mouth daily.   . [DISCONTINUED] omeprazole (PRILOSEC) 20 MG capsule Take 20 mg by mouth daily.    . [DISCONTINUED] tamsulosin (FLOMAX) 0.4 MG CAPS capsule TAKE 1 CAPSULE BY MOUTH  DAILY   No facility-administered encounter medications on file as of 09/16/2018.   : Review of Systems:  Out of a complete 14 point review of systems, all are reviewed and negative with the exception of these symptoms as listed below: Review of Systems  Neurological:       Pt presents today to discuss his tremors. Pt is right handed but notices the tremor more on his left hand.     Objective:  Neurological Exam  Physical Exam Physical Examination:   Vitals:   09/16/18 1000  BP: (!) 176/77  Pulse: 81    General Examination: The patient is a very pleasant 83 y.o. male in no acute distress. He appears well-developed and well-nourished and well groomed.   HEENT: Normocephalic, atraumatic, pupils are equal, round and reactive to light and accommodation. Extraocular tracking is preserved fairly well. He has no nystagmus, hearing is mildly impaired. He has perhaps mild facial masking. He has moderate to significant nuchal rigidity. He has restricted range of motion in the neck. He has an intermittent lower jaw tremor. Maybe mild hypophonia. There is sialorrhea. There are no carotid bruits on auscultation. Oropharynx exam reveals: adequate dental hygiene,  tongue protrudes centrally and palate elevates symmetrically.   Chest: Clear to auscultation without wheezing, rhonchi or crackles noted.  Heart: S1+S2+0, regular and normal without murmurs, rubs or gallops noted.   Abdomen: Soft, non-tender and non-distended with normal bowel sounds appreciated on auscultation.  Extremities: There is no pitting edema in the distal lower extremities bilaterally. Pedal pulses are intact.  Skin: Warm and dry without trophic changes noted.  Musculoskeletal: exam reveals lower back stiffness, upper back stiffness, restricted neck mobility, significantly restricted mobility in the left shoulder, arthritic changes in both hands.   Neurologically:  Mental status: The patient is awake, alert and oriented in all 4 spheres. His immediate and remote memory, attention, language skills and fund of knowledge are appropriate. There is no evidence of aphasia, agnosia, apraxia or anomia. Speech is clear with normal prosody and enunciation. Thought process is linear. Mood is normal and affect is normal.  Cranial nerves II - XII are as described above under HEENT exam. In addition: shoulder shrug is normal with equal shoulder height noted. Motor exam: Normal bulk, strength and mildly increased tone noted in the left more than right upper extremity.  On 09/16/2018: on Archimedes spiral drawing he has moderate difficulty with the left hand, mild difficulty with right hand. Handwriting with his dominant, right hand is somewhat difficult to read, on the smaller side, not particularly tremulous. He has a mild but intermittent resting tremor in the left hand, at times it is slight. He has no other resting tremor. He does have a slight postural tremor in the left more than right hand, no significant action tremor. He has no intention tremor. Romberg is not tested due to safety concerns. Reflexes are 1+ in the upper extremities, trace in the knees and absent in the ankles. Fine motor skills  and coordination: on finger taps he has moderate difficulty with the left and mildly so with the right hand, rapid alternating patting is moderately impaired on the left and mildly so on the right. Hand movements are mildly impaired on the left and fairly normal on the right. On foot  agility and foot taps he has difficulty with the right foot because it has limited mobility in the ankle, left foot shows mild difficulty.  intact with normal finger taps, normal hand movements, normal rapid alternating patting, normal foot taps and normal foot agility.  Cerebellar testing: No dysmetria or intention tremor on finger to nose testing. Heel to shin is unremarkable bilaterally. There is no truncal or gait ataxia.  Sensory exam: intact to light touch in the upper and lower extremities.  Gait, station and balance: He stands with difficulty. No veering to one side is noted. No leaning to one side is noted. Posture is moderately stooped. He walks without a walking aid, slowly and cautiously: Decreased arm swing on the left noted. He has a more noticeable tremor in the left hand while walking.  Assessment and Plan:   In summary, [name] is a very pleasant 83 y.o.-year old male with an underlying complex medical history of peptic ulcer disease with history of upper GI bleed, TIA, depression, degenerative disc disease in the neck, osteoarthritis, kidney stones, hyperlipidemia, hypertension, history of hiatal hernia, BPH, arthritis, including ankylosing spondylitis, for which he sees you in rheumatology, who presents for evaluation of his tremor, gait disorder, and balance issues. His history and examination are complicated. He has multiple issues including significant arthritic issues. Nevertheless, there are some findings in keeping with parkinsonism, possibly left-sided predominant Parkinson's disease. I would like to proceed with a nuclear medicine scan called DaT for help with narrowing down the diagnosis. We  may embark on a trial of Sinemet soon. He is agreeable to pursuing the scan first. I provided information and written informed consent was obtained today. He reports that he is in the process of switching over to another blood pressure medication. He is advised to pursue this with his PCP. He had some cough from what I understand with an ACE inhibitor. I suggested a follow-up in about 3 months, we will try to schedule the scan in the interim and also call him with the results in the interim. We talked about the importance of healthy lifestyle, good hydration, good rest with good sleep habits and trying to stay active physically and mentally. I answered all their questions today and the patient and his wife were in agreement I spent 60 minutes in total face-to-face time with the patient, more than 50% of which was spent in counseling and coordination of care, reviewing test results, reviewing medication and discussing or reviewing the diagnosis of PD or Parkinsonism, its prognosis and treatment options. Pertinent laboratory and imaging test results that were available during this visit with the patient were reviewed by me and considered in my medical decision making (see chart for details). Thank you very much for allowing me to participate in the care of this nice patient. If I can be of any further assistance to you please do not hesitate to call me at [phone].  Sincerely,   Casey Age, MD, PhD

## 2018-09-16 NOTE — Telephone Encounter (Signed)
Patient states that his PCP, Bevelyn Buckles MD has requested a copy of the notes from his visit with [place] today.

## 2018-09-16 NOTE — Telephone Encounter (Signed)
Waiting on Dr. Rexene Alberts to finish her notes.

## 2018-09-17 LAB — COMPREHENSIVE METABOLIC PANEL
ALT: 20 IU/L (ref 0–44)
AST: 22 IU/L (ref 0–40)
Albumin/Globulin Ratio: 2.8 — ABNORMAL HIGH (ref 1.2–2.2)
Albumin: 5 g/dL — ABNORMAL HIGH (ref 3.7–4.7)
Alkaline Phosphatase: 69 IU/L (ref 39–117)
BUN/Creatinine Ratio: 22 (ref 10–24)
BUN: 25 mg/dL (ref 8–27)
Bilirubin Total: 1 mg/dL (ref 0.0–1.2)
CO2: 22 mmol/L (ref 20–29)
Calcium: 10.2 mg/dL (ref 8.6–10.2)
Chloride: 102 mmol/L (ref 96–106)
Creatinine, Ser: 1.16 mg/dL (ref 0.76–1.27)
GFR calc Af Amer: 68 mL/min/{1.73_m2} (ref >59)
GFR calc non Af Amer: 59 mL/min/{1.73_m2} — ABNORMAL LOW (ref >59)
Globulin, Total: 1.8 g/dL (ref 1.5–4.5)
Glucose: 98 mg/dL (ref 65–99)
Potassium: 4.9 mmol/L (ref 3.5–5.2)
Sodium: 141 mmol/L (ref 134–144)
Total Protein: 6.8 g/dL (ref 6.0–8.5)

## 2018-09-17 NOTE — Telephone Encounter (Signed)
Jeannine with [facility] gave me the information about the patient insurance. PA not required. Medicare does provide positive coverage for the procedure per national coverage determination #220.12. IN addition, secondary payer is medicare supplement plan and follows medicare guidelines.   I did leave a vmail on [place] phone at [phone] to let her know the Dat Scan is ready to be scheduled.

## 2018-09-17 NOTE — Telephone Encounter (Signed)
Faxed everything to [facility] it is pending.

## 2018-09-21 DIAGNOSIS — L570 Actinic keratosis: Secondary | ICD-10-CM | POA: Diagnosis not present

## 2018-10-02 NOTE — Progress Notes (Signed)
CARDIOLOGY OFFICE NOTE  Date:  10/06/2018    [name] Date of Birth: Jan 02, 1936 Medical Record #[id number]  PCP:  Leanna Battles, MD  Cardiologist:   [place] MD   Chief Complaint  Patient presents with  . Hypertension  . Hyperlipidemia    History of Present Illness: [name] is a 83 y.o. male who presents today for cardiac follow up.   He has a history of hyperlipidemia, HTN, and GERD. He has no known CAD. Negative stress echo in 2011. Does have known carotid disease -  study in 07/2014 with 40 to 59% RICA stenosis. Admitted in December 2016 with possible TIA. Echo was unremarkable. Carotid dopplers then showed only mild disease.   On follow up today he states he is doing fair. He has developed paroxysms of cough several times at night. Minimal sputum. Not as much of an issue during the day. Still limited by ankylosing spondylitis. No chest pain or dyspnea.,    Past Medical History:  Diagnosis Date  . Ankylosing spondylitis ([place]) dx'd ~ 1974  . Arthritis    "back" (08/10/2015)  . BENIGN PROSTATIC HYPERTROPHY, WITH OBSTRUCTION 01/15/2010  . Bleeding duodenal ulcer   . Bleeding esophageal ulcer   . Bleeding stomach ulcer   . GERD 02/22/2008  . History of blood transfusion 1988   "lost ~ 1/2 of my blood volume from multiple bleeding ulcers"  . History of hiatal hernia   . HYPERGLYCEMIA 11/18/2007  . HYPERLIPIDEMIA 02/22/2008  . HYPERTENSION, UNSPECIFIED 11/10/2009  . Kidney stones   . Osteoarthritis    c-spine  . PEPTIC ULCER DISEASE 11/17/2007  . Situational depression    "son died in [place] 17-Jul-2015"  . TIA (transient ischemic attack)    "not that I know of in the past; they are trying to determine if I've had one today (08/10/2015)"    Past Surgical History:  Procedure Laterality Date  . CATARACT EXTRACTION W/ INTRAOCULAR LENS  IMPLANT, BILATERAL Bilateral 2013     Medications: Current Outpatient Medications  Medication Sig Dispense Refill  .  aspirin 81 MG tablet Take 81 mg by mouth daily.      Marland Kitchen atorvastatin (LIPITOR) 10 MG tablet TAKE 1 TABLET BY MOUTH  DAILY 90 tablet 3  . Cholecalciferol (VITAMIN D PO) Take 1,000 Units by mouth daily.    . famotidine (PEPCID) 20 MG tablet Take 20 mg by mouth 2 (two) times daily.     Marland Kitchen levothyroxine (SYNTHROID, LEVOTHROID) 25 MCG tablet Take 1 tablet (25 mcg total) by mouth daily before breakfast. 90 tablet 3  . meloxicam (MOBIC) 15 MG tablet TAKE 1 TABLET BY MOUTH  DAILY 90 tablet 4  . Multiple Vitamin (MULTIVITAMIN WITH MINERALS) TABS tablet Take 1 tablet by mouth daily.    . Secukinumab (COSENTYX [place]) Inject into the skin.    Marland Kitchen nitroGLYCERIN (NITROSTAT) 0.4 MG SL tablet Place 1 tablet (0.4 mg total) under the tongue every 5 (five) minutes as needed. (Patient not taking: Reported on 10/06/2018) 25 tablet 6   No current facility-administered medications for this visit.     Allergies: No Known Allergies  Social History: The patient  reports that he quit smoking about 40 years ago. His smoking use included cigars and pipe. He quit after 15.00 years of use. He has never used smokeless tobacco. He reports current alcohol use. He reports that he does not use drugs.   Family History: The patient's family history includes Appendicitis in his mother;  Colon cancer in his father and another family member; Diabetes type II in his father; Lupus in his sister.   Review of Systems: Please see the history of present illness.   All other systems are reviewed and negative.   Physical Exam: VS:  BP 132/62   Pulse 81   Ht 5' 6.5" (1.689 m)   Wt 146 lb 9.6 oz (66.5 kg)   BMI 23.31 kg/m  .  BMI Body mass index is 23.31 kg/m.  Wt Readings from Last 3 Encounters:  10/06/18 146 lb 9.6 oz (66.5 kg)  09/16/18 148 lb (67.1 kg)  12/25/17 148 lb (67.1 kg)   GENERAL:  Well appearing WM in NAD HEENT:  PERRL, EOMI, sclera are clear. Oropharynx is clear. NECK:  No jugular venous distention, carotid upstroke  brisk and symmetric, no bruits, no thyromegaly or adenopathy LUNGS:  Clear to auscultation bilaterally CHEST:  Kyphotic.  HEART:  RRR,  PMI not displaced or sustained,S1 and S2 within normal limits, no S3, no S4: no clicks, no rubs, no murmurs ABD:  Soft, nontender. BS +, no masses or bruits. No hepatomegaly, no splenomegaly EXT:  2 + pulses throughout, no edema, no cyanosis no clubbing SKIN:  Warm and dry.  No rashes NEURO:  Alert and oriented x 3. Cranial nerves II through XII intact. PSYCH:  Cognitively intact      LABORATORY DATA:  EKG:  EKG is  ordered today. It shows NSR rate 81. Normal Ecg. I have personally reviewed and interpreted this study.   Lab Results  Component Value Date   WBC 5.6 11/04/2017   HGB 12.4 (L) 11/04/2017   HCT 36.0 (L) 11/04/2017   PLT 189.0 11/04/2017   GLUCOSE 98 09/16/2018   CHOL 118 11/04/2017   TRIG 35.0 11/04/2017   HDL 68.70 11/04/2017   LDLCALC 42 11/04/2017   ALT 20 09/16/2018   AST 22 09/16/2018   NA 141 09/16/2018   K 4.9 09/16/2018   CL 102 09/16/2018   CREATININE 1.16 09/16/2018   BUN 25 09/16/2018   CO2 22 09/16/2018   TSH 3.29 12/25/2017   PSA 0.38 01/15/2010   INR 0.9 11/03/2007   HGBA1C 5.9 (H) 08/11/2015   Dated 05/29/17: creatinine 1.17. Hgb 12.4.   BNP (last 3 results) No results for input(s): BNP in the last 8760 hours.  ProBNP (last 3 results) No results for input(s): PROBNP in the last 8760 hours.   Other Studies Reviewed Today: Carotid dopplers 12/03/16: 1-39% bilateral carotid disease.  Assessment/Plan: 1. Carotid stenosis: mild by doppler in 2018.   2. Hyperlipidemia: on statin therapy. Excellent control last year. Scheduled for lab work next week with primary care.   3. HTN: well controlled on lisinopril HCT but now with nocturnal cough. ACEi may be playing a role. Will switch to losartan HCT 50/12.5 mg 1/2 tablet daily.   4.. Ankylosing spondylitis  Current medicines are reviewed with the patient  today.  The patient does not have concerns regarding medicines other than what has been noted above.  The following changes have been made:  See above.  Labs/ tests ordered today include:    No orders of the defined types were placed in this encounter.    Signed:  [place] MD, [facility]  10/06/2018 10:39 AM  [place]

## 2018-10-06 ENCOUNTER — Ambulatory Visit (INDEPENDENT_AMBULATORY_CARE_PROVIDER_SITE_OTHER): Payer: Medicare Other | Admitting: Cardiology

## 2018-10-06 ENCOUNTER — Encounter: Payer: Self-pay | Admitting: Cardiology

## 2018-10-06 VITALS — BP 132/62 | HR 81 | Ht 66.5 in | Wt 146.6 lb

## 2018-10-06 DIAGNOSIS — I10 Essential (primary) hypertension: Secondary | ICD-10-CM | POA: Diagnosis not present

## 2018-10-06 DIAGNOSIS — E7800 Pure hypercholesterolemia, unspecified: Secondary | ICD-10-CM | POA: Diagnosis not present

## 2018-10-06 MED ORDER — LOSARTAN POTASSIUM-HCTZ 50-12.5 MG PO TABS
0.5000 | ORAL_TABLET | Freq: Every day | ORAL | 3 refills | Status: DC
Start: 2018-10-06 — End: 2019-06-11

## 2018-10-06 NOTE — Patient Instructions (Signed)
We will switch lisinopril HCT to losartan HCT 50/12.5 mg take 1/2 tablet daily

## 2018-10-07 DIAGNOSIS — E038 Other specified hypothyroidism: Secondary | ICD-10-CM | POA: Diagnosis not present

## 2018-10-07 DIAGNOSIS — R82998 Other abnormal findings in urine: Secondary | ICD-10-CM | POA: Diagnosis not present

## 2018-10-07 DIAGNOSIS — Z125 Encounter for screening for malignant neoplasm of prostate: Secondary | ICD-10-CM | POA: Diagnosis not present

## 2018-10-07 DIAGNOSIS — E7849 Other hyperlipidemia: Secondary | ICD-10-CM | POA: Diagnosis not present

## 2018-10-07 DIAGNOSIS — I10 Essential (primary) hypertension: Secondary | ICD-10-CM | POA: Diagnosis not present

## 2018-10-09 ENCOUNTER — Other Ambulatory Visit: Payer: Self-pay | Admitting: *Deleted

## 2018-10-15 DIAGNOSIS — E7849 Other hyperlipidemia: Secondary | ICD-10-CM | POA: Diagnosis not present

## 2018-10-15 DIAGNOSIS — Z1339 Encounter for screening examination for other mental health and behavioral disorders: Secondary | ICD-10-CM | POA: Diagnosis not present

## 2018-10-15 DIAGNOSIS — Z8673 Personal history of transient ischemic attack (TIA), and cerebral infarction without residual deficits: Secondary | ICD-10-CM | POA: Diagnosis not present

## 2018-10-15 DIAGNOSIS — R202 Paresthesia of skin: Secondary | ICD-10-CM | POA: Diagnosis not present

## 2018-10-15 DIAGNOSIS — Z0000 Encounter for general adult medical examination without abnormal findings: Secondary | ICD-10-CM | POA: Diagnosis not present

## 2018-10-15 DIAGNOSIS — N183 Chronic kidney disease, stage 3 (moderate): Secondary | ICD-10-CM | POA: Diagnosis not present

## 2018-10-15 DIAGNOSIS — E038 Other specified hypothyroidism: Secondary | ICD-10-CM | POA: Diagnosis not present

## 2018-10-15 DIAGNOSIS — H6123 Impacted cerumen, bilateral: Secondary | ICD-10-CM | POA: Diagnosis not present

## 2018-10-15 DIAGNOSIS — Z6824 Body mass index (BMI) 24.0-24.9, adult: Secondary | ICD-10-CM | POA: Diagnosis not present

## 2018-10-15 DIAGNOSIS — I6523 Occlusion and stenosis of bilateral carotid arteries: Secondary | ICD-10-CM | POA: Diagnosis not present

## 2018-10-15 DIAGNOSIS — N400 Enlarged prostate without lower urinary tract symptoms: Secondary | ICD-10-CM | POA: Diagnosis not present

## 2018-10-15 DIAGNOSIS — I10 Essential (primary) hypertension: Secondary | ICD-10-CM | POA: Diagnosis not present

## 2018-11-05 DIAGNOSIS — M79676 Pain in unspecified toe(s): Secondary | ICD-10-CM | POA: Diagnosis not present

## 2018-11-05 DIAGNOSIS — B351 Tinea unguium: Secondary | ICD-10-CM | POA: Diagnosis not present

## 2018-11-09 ENCOUNTER — Encounter: Payer: Medicare Other | Admitting: Internal Medicine

## 2018-11-09 DIAGNOSIS — Z79899 Other long term (current) drug therapy: Secondary | ICD-10-CM | POA: Diagnosis not present

## 2018-11-09 DIAGNOSIS — M459 Ankylosing spondylitis of unspecified sites in spine: Secondary | ICD-10-CM | POA: Diagnosis not present

## 2018-11-09 DIAGNOSIS — M436 Torticollis: Secondary | ICD-10-CM | POA: Diagnosis not present

## 2018-11-09 DIAGNOSIS — Z6823 Body mass index (BMI) 23.0-23.9, adult: Secondary | ICD-10-CM | POA: Diagnosis not present

## 2018-11-09 DIAGNOSIS — M545 Low back pain: Secondary | ICD-10-CM | POA: Diagnosis not present

## 2018-11-09 DIAGNOSIS — M150 Primary generalized (osteo)arthritis: Secondary | ICD-10-CM | POA: Diagnosis not present

## 2018-11-19 ENCOUNTER — Ambulatory Visit (HOSPITAL_COMMUNITY): Payer: Medicare Other

## 2018-11-19 ENCOUNTER — Other Ambulatory Visit (HOSPITAL_COMMUNITY): Payer: Medicare Other

## 2018-11-29 ENCOUNTER — Other Ambulatory Visit: Payer: Self-pay | Admitting: Cardiology

## 2018-12-15 ENCOUNTER — Telehealth: Payer: Self-pay

## 2018-12-15 ENCOUNTER — Telehealth: Payer: Self-pay | Admitting: Neurology

## 2018-12-15 NOTE — Telephone Encounter (Signed)
Pt consented to a Tele Visit due to not having the equipment to do a Virtual Visit.  Pt understands that although there may be some limitations with this type of visit, we will take all precautions to reduce any security or privacy concerns.  Pt understands that this will be treated like an in office visit and we will file with pt's insurance, and there may be a patient responsible charge related to this service.

## 2018-12-15 NOTE — Telephone Encounter (Signed)
I called pt to discuss converting his appt on 12/23/2018 to a virtual visit. It is unlikely that pt's DaT scan will be completed prior to his appt next week due to the covid restrictions.  Pt is unavailable right now and will call me back. If pt calls back, please discuss this with him and offer him a virtual visit instead, discuss consent as well.

## 2018-12-16 ENCOUNTER — Ambulatory Visit (HOSPITAL_COMMUNITY): Payer: Medicare Other

## 2018-12-22 NOTE — Telephone Encounter (Signed)
Pt returned my call. I offered him a virtual visit. Pt is insistent that he has a smart phone but does not use the camera, etc and is quite adamant that he does not want to do a virtual visit. I offered pt the opportunity to delay his appt but pt feels that delaying his appt is not appropriate because his tremor is getting worse. He is asking for a call from Dr. Rexene Alberts at his appt time tomorrow at his home number listed.  Pt's meds, allergies, and PMH were updated.

## 2018-12-22 NOTE — Addendum Note (Signed)
Addended by: Lester Demorest A on: 12/22/2018 02:56 PM   Modules accepted: Orders

## 2018-12-22 NOTE — Telephone Encounter (Signed)
I called pt to discuss updating his chart. No answer, left a message asking him to call me back.

## 2018-12-23 ENCOUNTER — Ambulatory Visit (INDEPENDENT_AMBULATORY_CARE_PROVIDER_SITE_OTHER): Payer: Medicare Other | Admitting: Neurology

## 2018-12-23 ENCOUNTER — Encounter: Payer: Self-pay | Admitting: Neurology

## 2018-12-23 ENCOUNTER — Other Ambulatory Visit: Payer: Self-pay

## 2018-12-23 DIAGNOSIS — G20 Parkinson's disease: Secondary | ICD-10-CM

## 2018-12-23 MED ORDER — CARBIDOPA-LEVODOPA 25-100 MG PO TABS: ORAL_TABLET | ORAL | 2 refills | Status: DC

## 2018-12-23 NOTE — Progress Notes (Signed)
Interim history:  [name] is a 83 year old right-handed gentleman with an underlying complex medical history of peptic ulcer disease with history of upper GI bleed, TIA, depression, degenerative disc disease in the neck, osteoarthritis, kidney stones, hyperlipidemia, hypertension, history of hiatal hernia, BPH, arthritis, and ankylosing spondylitis, with whom Im conducting virtual, phone based visit in view of a face-to-face visit for follow-up consultation of his parkinsonism. The patient is unaccompanied today and joins from home. I am in my office.  First met him on 09/16/2018 at the request of his rheumatology PA, at which time he reported a bilateral hand tremor for the past at least one year, left side more noticeable than right. He had also increased difficulty with mobility. He had some findings concerning for parkinsonism. I suggested we proceed with a nuclear medicine DaT scan but this was delayed secondary to the coronavirus pandemic.  Today, 12/23/2018: Please also see below for virtual visit documentation.    Previously:   09/16/2018: (He) reports bilateral hand tremor, left more than right for the past at least one year with some progression noted. His wife has noted significant impairment in his mobility this time. He has a left more than right tremor, the left hand is the most significant. He has not noticed any lower extremity tremor. He does have restriction in his shoulder mobility, left more than right. He has no family history of Parkinson's disease, does not know much about his mother's history her family history, she died at 76, when he was 84 years old. His paternal grandmother lived to be above 18 years old. He had a sister who died young at 47 from lupus, his brother has significant arthritis issues at age 53. He injured his right ankle and foot when he was a child and continues to have restricted movements in his right ankle. He has fallen a few times. He fell one or 2 times  in the last 6 months, one time he fell in the bathroom and one time he fell in the closet. He has had intermittent numbness in both hands, particularly the right hand, particularly with certain movements or posture, such as while holding the steering wheel.    I reviewed your office note from 07/10/2018, which you kindly included. He has also had some balance issues. For his arthritis he takes Cosentyx injections monthly. Of note, he had a brain MRI without contrast as well as MRA head without contrast on 08/10/2015 and I reviewed the results: IMPRESSION: MRI HEAD IMPRESSION:   Negative brain MRI with no acute intracranial infarct or other process identified.   MRA HEAD IMPRESSION:   Negative intracranial MRA. No large or proximal arterial branch occlusion. No high-grade or correctable stenosis.   He is retired, lives with his wife, he quit smoking and drinks alcohol about once a week, caffeine in the form of coffee, 2-3 cups per day on average.   His Past Medical History Is Significant For: Past Medical History:  Diagnosis Date   Ankylosing spondylitis ([place]) dx'd ~ 1974   Arthritis    "back" (08/10/2015)   BENIGN PROSTATIC HYPERTROPHY, WITH OBSTRUCTION 01/15/2010   Bleeding duodenal ulcer    Bleeding esophageal ulcer    Bleeding stomach ulcer    GERD 02/22/2008   History of blood transfusion 1988   "lost ~ 1/2 of my blood volume from multiple bleeding ulcers"   History of hiatal hernia    HYPERGLYCEMIA 11/18/2007   HYPERLIPIDEMIA 02/22/2008   HYPERTENSION, UNSPECIFIED 11/10/2009  Kidney stones    Osteoarthritis    c-spine   PEPTIC ULCER DISEASE 11/17/2007   Situational depression    "son died in [place] 2015-06-21"   TIA (transient ischemic attack)    "not that I know of in the past; they are trying to determine if I've had one today (08/10/2015)"    His Past Surgical History Is Significant For: Past Surgical History:  Procedure Laterality Date   CATARACT  EXTRACTION W/ INTRAOCULAR LENS  IMPLANT, BILATERAL Bilateral 2013    His Family History Is Significant For: Family History  Problem Relation Age of Onset   Appendicitis Mother        complications   Colon cancer Father    Diabetes type II Father    Lupus Sister    Colon cancer Other        1st degree relative <60    His Social History Is Significant For: Social History   Socioeconomic History   Marital status: Married    Spouse name: Not on file   Number of children: Not on file   Years of education: Not on file   Highest education level: Not on file  Occupational History   Not on file  Social Needs   Financial resource strain: Not on file   Food insecurity:    Worry: Not on file    Inability: Not on file   Transportation needs:    Medical: Not on file    Non-medical: Not on file  Tobacco Use   Smoking status: Former Smoker    Years: 15.00    Types: Cigars, Pipe    Last attempt to quit: 08/26/1978    Years since quitting: 40.3   Smokeless tobacco: Never Used  Substance and Sexual Activity   Alcohol use: Yes    Comment: 08/10/2015 "glass of wine ~ month or so; occasionally a can of beer"   Drug use: No   Sexual activity: Yes  Lifestyle   Physical activity:    Days per week: Not on file    Minutes per session: Not on file   Stress: Not on file  Relationships   Social connections:    Talks on phone: Not on file    Gets together: Not on file    Attends religious service: Not on file    Active member of club or organization: Not on file    Attends meetings of clubs or organizations: Not on file    Relationship status: Not on file  Other Topics Concern   Not on file  Social History Narrative   Retired and married.     His Allergies Are:  No Known Allergies:   His Current Medications Are:  Outpatient Encounter Medications as of 12/23/2018  Medication Sig   aspirin 81 MG tablet Take 81 mg by mouth daily.     atorvastatin (LIPITOR)  10 MG tablet TAKE 1 TABLET BY MOUTH  DAILY   Cholecalciferol (VITAMIN D PO) Take 1,000 Units by mouth daily.   famotidine (PEPCID) 20 MG tablet Take 20 mg by mouth 2 (two) times daily.    levothyroxine (SYNTHROID, LEVOTHROID) 25 MCG tablet Take 1 tablet (25 mcg total) by mouth daily before breakfast.   losartan-hydrochlorothiazide (HYZAAR) 50-12.5 MG tablet Take 0.5 tablets by mouth daily.   Multiple Vitamin (MULTIVITAMIN WITH MINERALS) TABS tablet Take 1 tablet by mouth daily.   nitroGLYCERIN (NITROSTAT) 0.4 MG SL tablet Place 1 tablet (0.4 mg total) under the tongue every 5 (five) minutes as  needed.   Secukinumab (COSENTYX [place]) Inject into the skin.   sulindac (CLINORIL) 150 MG tablet Take 150 mg by mouth daily as needed.   No facility-administered encounter medications on file as of 12/23/2018.   :  Review of Systems:  Out of a complete 14 point review of systems, all are reviewed and negative with the exception of these symptoms as listed below:  Virtual Visit via Telephone Note on 12/23/18:   I connected with Mr. Cogburn on 12/23/18 at  1:00 PM EDT by telephone and verified that I am speaking with the correct person using two identifiers.   I discussed the limitations, risks, security and privacy concerns of performing an evaluation and management service by telephone and the availability of in person appointments. I also discussed with the patient that there may be a patient responsible charge related to this service. The patient expressed understanding and agreed to proceed.   History of Present Illness: He reports that his left hand tremor is a little worse. He has noticed that sometimes his right upper extremity has involuntary movements particularly when he is waking up from a nap. He has had some flailing movements that he has no warning 4. He has not noticed a significant increase in tremor in the right hand. His balance has become a little worse, he has not fallen, he tries to  walk on a regular basis, tries to walk outside in the park when possible. DaT scan is delayed, he has not heard back about rescheduling.    Observations/Objective: The most recent vital signs available for my review in his chart are from 10/06/2018: Blood pressure 132/62, pulse 81, weight 146.6 pounds for BMI of 23.31. He is in NAD, pleasant, conversant, speech is mildly hypophonic and slightly slow but easy to understand and no voice tremor noted. Comprehension is very good, no obvious language problems.  Assessment and Plan: In summary, JEDIAH HORGER is an 83 year old male with an underlying complex medical history of peptic ulcer disease with history of upper GI bleed, TIA, depression, degenerative disc disease in the neck, osteoarthritis, kidney stones, hyperlipidemia, hypertension, history of hiatal hernia, BPH, arthritis, including ankylosing spondylitis, who presents for Virtual, phone based follow-up consultation of his tremors. His history is complicated. He has multiple issues including significant arthritic issues. Nevertheless, there have been findings concerning for parkinsonism, there was left-sided lateralization upon my examination in January 2020. I suggested we try him on a cautious trial of Sinemet, he will start with 25-100 milligrams strength half a pill twice a day with gradual increase to 1 pill 3 times a day. We talked about potential side effects, he was given verbal instructions but I also will write down his instructions in the after visit summary and he has access to My Chart. The DaT had to be delayed secondary to the virus pandemic and I explained to him that it can be delayed and we would have to just wait if he can have it done in June or July. I suggested a follow-up in about 3 months, we will try to schedule the scan in the interim and also call him with the results in the interim. We talked about the importance of healthy lifestyle, good hydration, good rest with good sleep  habits and trying to stay active physically and mentally, we talked about the importance of fall prevention. I answered all his questions today and the patient was in agreement.   Follow Up Instructions: 1. Start C/L. 2. Schedule  DaT scan when feasible.  3. FU 3 mon.   I discussed the assessment and treatment plan with the patient. The patient was provided an opportunity to ask questions and all were answered. The patient agreed with the plan and demonstrated an understanding of the instructions.   The patient was advised to call back or seek an in-person evaluation if the symptoms worsen or if the condition fails to improve as anticipated.  I provided 25 minutes of non-face-to-face time during this encounter.   Star Age, MD

## 2018-12-23 NOTE — Patient Instructions (Signed)
Given over the phone during today's phone call virtual visit.  1. Start Sinemet (generic name: carbidopa-levodopa) 25/100 mg: Take half a pill twice daily (8 AM and noon) for one week, then half a pill 3 times a day (8 AM, noon, and 4 PM) for one week, then one pill 3 times a day thereafter. Please try to take the medication away from you mealtimes, that is, ideally either one hour before or 2 hours after your meal to ensure optimal absorption. The medication can interfere with the protein content of your meal and trying to the protein in your food and therefore not get fully absorbed.  Common side effects reported are: Nausea, vomiting, sedation, confusion, lightheadedness. Rare side effects include hallucinations, severe nausea or vomiting, diarrhea and significant drop in blood pressure especially when going from lying to standing or from sitting to standing.   2. Follow-up in clinic in 3 months.  3. Schedule nuclear medicine DaT scan when safe to do so, [facility] should call for rescheduling.

## 2019-01-07 ENCOUNTER — Telehealth: Payer: Self-pay

## 2019-01-07 ENCOUNTER — Ambulatory Visit (HOSPITAL_COMMUNITY)
Admission: RE | Admit: 2019-01-07 | Discharge: 2019-01-07 | Disposition: A | Discharge status: Home / Self Care | Payer: Medicare Other | Source: Ambulatory Visit | Attending: Neurology | Admitting: Neurology

## 2019-01-07 ENCOUNTER — Other Ambulatory Visit: Payer: Self-pay

## 2019-01-07 DIAGNOSIS — R2681 Unsteadiness on feet: Secondary | ICD-10-CM | POA: Diagnosis not present

## 2019-01-07 DIAGNOSIS — G20 Parkinson's disease: Secondary | ICD-10-CM | POA: Diagnosis not present

## 2019-01-07 MED ORDER — IODINE STRONG (LUGOLS) 5 % PO SOLN
0.8000 mL | Freq: Once | ORAL | Status: AC
  Administered 2019-01-07: 0.8 mL via ORAL

## 2019-01-07 MED ORDER — IOFLUPANE I 123 185 MBQ/2.5ML IV SOLN
4.8000 | Freq: Once | INTRAVENOUS | Status: AC | PRN
  Administered 2019-01-07: 4.8 via INTRAVENOUS
  Filled 2019-01-07: qty 5

## 2019-01-07 NOTE — Progress Notes (Signed)
Please call pt regarding the recent DaT scan: the radioactive marker uptake pattern was in keeping with parkinsonism, and supports our approach to use medication for  Parkinson's disease. We had a recent VV and started C/L. Follow-up as scheduled. Star Age, MD, PhD [facility])

## 2019-01-07 NOTE — Telephone Encounter (Signed)
I called pt, discussed his DaT scan results with him and Dr. Guadelupe Sabin recommendations. Pt verbalized understanding of results. Pt had no questions at this time but was encouraged to call back if questions arise.

## 2019-01-07 NOTE — Telephone Encounter (Signed)
-----   Message from Star Age, MD sent at 01/07/2019  4:32 PM EDT ----- Please call pt regarding the recent DaT scan: the radioactive marker uptake pattern was in keeping with parkinsonism, and supports our approach to use medication for  Parkinson's disease. We had a recent VV and started C/L. Follow-up as scheduled. Star Age, MD, PhD [facility])

## 2019-01-12 ENCOUNTER — Telehealth: Payer: Self-pay | Admitting: Neurology

## 2019-01-12 MED ORDER — CARBIDOPA-LEVODOPA 25-100 MG PO TABS: 1.0000 | ORAL_TABLET | Freq: Three times a day (TID) | ORAL | 0 refills | Status: DC

## 2019-01-12 NOTE — Telephone Encounter (Signed)
90 day supply of sinemet sent to [facility] per pt request.

## 2019-01-12 NOTE — Addendum Note (Signed)
Addended by: Lester [place] A on: 01/12/2019 11:19 AM   Modules accepted: Orders

## 2019-01-12 NOTE — Telephone Encounter (Signed)
Pt has called re: his carbidopa-levodopa (SINEMET IR) 25-100 MG tablet he is asking that 270(90 days worth) be called into  [place]

## 2019-01-20 DIAGNOSIS — H52203 Unspecified astigmatism, bilateral: Secondary | ICD-10-CM | POA: Diagnosis not present

## 2019-01-20 DIAGNOSIS — Z961 Presence of intraocular lens: Secondary | ICD-10-CM | POA: Diagnosis not present

## 2019-02-15 ENCOUNTER — Other Ambulatory Visit: Payer: Self-pay | Admitting: Neurology

## 2019-02-18 ENCOUNTER — Other Ambulatory Visit: Payer: Self-pay

## 2019-02-18 MED ORDER — CARBIDOPA-LEVODOPA 25-100 MG PO TABS: 1.0000 | ORAL_TABLET | Freq: Three times a day (TID) | ORAL | 0 refills | Status: DC

## 2019-03-24 ENCOUNTER — Ambulatory Visit (INDEPENDENT_AMBULATORY_CARE_PROVIDER_SITE_OTHER): Payer: Medicare Other | Admitting: Neurology

## 2019-03-24 ENCOUNTER — Other Ambulatory Visit: Payer: Self-pay

## 2019-03-24 ENCOUNTER — Encounter: Payer: Self-pay | Admitting: Neurology

## 2019-03-24 VITALS — BP 173/74 | HR 77 | Ht 66.5 in | Wt 149.0 lb

## 2019-03-24 DIAGNOSIS — G20 Parkinson's disease: Secondary | ICD-10-CM

## 2019-03-24 MED ORDER — CARBIDOPA-LEVODOPA 25-100 MG PO TABS: 1.0000 | ORAL_TABLET | Freq: Three times a day (TID) | ORAL | 3 refills | Status: DC

## 2019-03-24 NOTE — Patient Instructions (Signed)
Your exam is fairly stable.  Please continue with Sinemet 1 tablet 3 times a day, I renewed your 90-day prescription with refills.  Please continue to exercise on a regular basis in the form of walking regularly.  Please try to stay hydrated well with water and be mindful of constipation.  Please also try to add in some resistance or strength training and his regimen.Follow-up in 6 months.

## 2019-03-24 NOTE — Progress Notes (Signed)
Subjective:    Patient ID: [name] is a 83 y.o. male.  HPI     Interim history:  [name] is a 83 year old right-handed gentleman with an underlying complex medical history of peptic ulcer disease with history of upper GI bleed, TIA, depression, degenerative disc disease in the neck, osteoarthritis, kidney stones, hyperlipidemia, hypertension, history of hiatal hernia, BPH, arthritis, and ankylosing spondylitis, who presents for follow-up consultation of his parkinsonism.  The patient is accompanied by his wife today.  I last spoke to him on 12/23/2018 via phone visit, at which time we mutually agreed to start Sinemet with gradual titration.  He had to reschedule his DaTscan secondary to the virus pandemic.  He had his DaTscan in the interim on 01/07/2019 and I reviewed the results: IMPRESSION: Decreased activity within the LEFT and RIGHT posterior striata is a pattern typical of Parkinson's syndrome pathology.   We called him with his test results.  Today, 03/24/2019:   He reports being able to tolerate the Sinemet.  He is currently taking 1 pill 3 times daily.  He has noticed a little bit of improvement in his balance, ability to turn his neck side to side and lift his head up a little better, slight improvement in the left hand tremor but not that much as far as the resting tremor goes in his wife thinks that the tremor is about the same.  Thankfully, he has not fallen.  He has no significant issues with constipation.  He tries to walk every day.  He has a good appetite and weight is stable.  He sleeps fairly well, has the occasional sleep talking and has had some rare instances of dream enactment behavior.  He denies any side effects with the Sinemet.   Previously:  I first met him on 09/16/2018 at the request of his rheumatology PA, at which time he reported a bilateral hand tremor for the past at least one year, left side more noticeable than right. He had also increased difficulty with  mobility. He had some findings concerning for parkinsonism. I suggested we proceed with a nuclear medicine DaT scan but this was delayed secondary to the coronavirus pandemic.    09/16/2018: (He) reports bilateral hand tremor, left more than right for the past at least one year with some progression noted. His wife has noted significant impairment in his mobility this time. He has a left more than right tremor, the left hand is the most significant. He has not noticed any lower extremity tremor. He does have restriction in his shoulder mobility, left more than right. He has no family history of Parkinson's disease, does not know much about his mother's history her family history, she died at 22, when he was 83 years old. His paternal grandmother lived to be above 76 years old. He had a sister who died young at 38 from lupus, his brother has significant arthritis issues at age 66. He injured his right ankle and foot when he was a child and continues to have restricted movements in his right ankle. He has fallen a few times. He fell one or 2 times in the last 6 months, one time he fell in the bathroom and one time he fell in the closet. He has had intermittent numbness in both hands, particularly the right hand, particularly with certain movements or posture, such as while holding the steering wheel.    I reviewed your office note from 07/10/2018, which you kindly included. He has also  had some balance issues. For his arthritis he takes Cosentyx injections monthly. Of note, he had a brain MRI without contrast as well as MRA head without contrast on 08/10/2015 and I reviewed the results: IMPRESSION: MRI HEAD IMPRESSION:   Negative brain MRI with no acute intracranial infarct or other process identified.   MRA HEAD IMPRESSION:   Negative intracranial MRA. No large or proximal arterial branch occlusion. No high-grade or correctable stenosis.   He is retired, lives with his wife, he quit smoking and  drinks alcohol about once a week, caffeine in the form of coffee, 2-3 cups per day on average.   His Past Medical History Is Significant For: Past Medical History:  Diagnosis Date  . Ankylosing spondylitis ([place]) dx'd ~ 1974  . Arthritis    "back" (08/10/2015)  . BENIGN PROSTATIC HYPERTROPHY, WITH OBSTRUCTION 01/15/2010  . Bleeding duodenal ulcer   . Bleeding esophageal ulcer   . Bleeding stomach ulcer   . GERD 02/22/2008  . History of blood transfusion 1988   "lost ~ 1/2 of my blood volume from multiple bleeding ulcers"  . History of hiatal hernia   . HYPERGLYCEMIA 11/18/2007  . HYPERLIPIDEMIA 02/22/2008  . HYPERTENSION, UNSPECIFIED 11/10/2009  . Kidney stones   . Osteoarthritis    c-spine  . PEPTIC ULCER DISEASE 11/17/2007  . Situational depression    "son died in [place] 06-22-2015"  . TIA (transient ischemic attack)    "not that I know of in the past; they are trying to determine if I've had one today (08/10/2015)"    His Past Surgical History Is Significant For: Past Surgical History:  Procedure Laterality Date  . CATARACT EXTRACTION W/ INTRAOCULAR LENS  IMPLANT, BILATERAL Bilateral 2013    His Family History Is Significant For: Family History  Problem Relation Age of Onset  . Appendicitis Mother        complications  . Colon cancer Father   . Diabetes type II Father   . Lupus Sister   . Colon cancer Other        1st degree relative <60    His Social History Is Significant For: Social History   Socioeconomic History  . Marital status: Married    Spouse name: Not on file  . Number of children: Not on file  . Years of education: Not on file  . Highest education level: Not on file  Occupational History  . Not on file  Social Needs  . Financial resource strain: Not on file  . Food insecurity    Worry: Not on file    Inability: Not on file  . Transportation needs    Medical: Not on file    Non-medical: Not on file  Tobacco Use  . Smoking status: Former Smoker     Years: 15.00    Types: Cigars, Pipe    Quit date: 08/26/1978    Years since quitting: 40.6  . Smokeless tobacco: Never Used  Substance and Sexual Activity  . Alcohol use: Yes    Comment: 08/10/2015 "glass of wine ~ month or so; occasionally a can of beer"  . Drug use: No  . Sexual activity: Yes  Lifestyle  . Physical activity    Days per week: Not on file    Minutes per session: Not on file  . Stress: Not on file  Relationships  . Social Herbalist on phone: Not on file    Gets together: Not on file    Attends religious  service: Not on file    Active member of club or organization: Not on file    Attends meetings of clubs or organizations: Not on file    Relationship status: Not on file  Other Topics Concern  . Not on file  Social History Narrative   Retired and married.     His Allergies Are:  No Known Allergies:   His Current Medications Are:  Outpatient Encounter Medications as of 03/24/2019  Medication Sig  . aspirin 81 MG tablet Take 81 mg by mouth daily.    Marland Kitchen atorvastatin (LIPITOR) 10 MG tablet TAKE 1 TABLET BY MOUTH  DAILY  . carbidopa-levodopa (SINEMET IR) 25-100 MG tablet Take 1 tablet by mouth 3 (three) times daily.  . Cholecalciferol (VITAMIN D PO) Take 1,000 Units by mouth daily.  . famotidine (PEPCID) 20 MG tablet Take 20 mg by mouth 2 (two) times daily.   Marland Kitchen levothyroxine (SYNTHROID, LEVOTHROID) 25 MCG tablet Take 1 tablet (25 mcg total) by mouth daily before breakfast.  . losartan-hydrochlorothiazide (HYZAAR) 50-12.5 MG tablet Take 0.5 tablets by mouth daily.  . Multiple Vitamin (MULTIVITAMIN WITH MINERALS) TABS tablet Take 1 tablet by mouth daily.  . nitroGLYCERIN (NITROSTAT) 0.4 MG SL tablet Place 1 tablet (0.4 mg total) under the tongue every 5 (five) minutes as needed.  . Secukinumab (COSENTYX Monserrate) Inject into the skin.  Marland Kitchen sulindac (CLINORIL) 150 MG tablet Take 150 mg by mouth daily as needed.   No facility-administered encounter medications on  file as of 03/24/2019.   :  Review of Systems:  Out of a complete 14 point review of systems, all are reviewed and negative with the exception of these symptoms as listed below: Review of Systems  Neurological:       Pt presents today to discuss his PD. Pt reports that he has been stable.    Objective:  Neurological Exam  Physical Exam   Physical Examination:   Vitals:   03/24/19 1039  BP: (!) 173/74  Pulse: 77    General Examination: The patient is a very pleasant 83 y.o. male in no acute distress. He appears well-developed and well-nourished and well groomed.   HEENT: Normocephalic, atraumatic, pupils are equal, round and reactive to light and accommodation. Extraocular tracking is preserved fairly well. He has no nystagmus, hearing is mildly impaired. He has mild facial masking. He has moderate nuchal rigidity. He has restricted range of motion in the neck. He has no obvious lower jaw tremor. Mild hypophonia. There is no sialorrhea. There are no carotid bruits on auscultation. Oropharynx exam reveals: adequate dental hygiene, tongue protrudes centrally and palate elevates symmetrically.   Chest: Clear to auscultation without wheezing, rhonchi or crackles noted.  Heart: S1+S2+0, regular and normal without murmurs, rubs or gallops noted.   Abdomen: Soft, non-tender and non-distended with normal bowel sounds appreciated on auscultation.  Extremities: There is no pitting edema in the distal lower extremities bilaterally.   Skin: Warm and dry without trophic changes noted.  Musculoskeletal: exam reveals lower back stiffness, reduced ROM in neck, restricted mobility in the left shoulder, arthritic changes in both hands. Restricted mobility in the right ankle from a childhood injury.  Neurologically:  Mental status: The patient is awake, alert and oriented in all 4 spheres. His immediate and remote memory, attention, language skills and fund of knowledge are appropriate.  There is no evidence of aphasia, agnosia, apraxia or anomia. Speech is clear with normal prosody and enunciation. Thought process is linear. Mood is  normal and affect is normal.  Cranial nerves II - XII are as described above under HEENT exam.  Motor exam: Normal bulk, strength and mildly increased tone noted in the left more than right upper extremity.   (On 09/16/2018: on Archimedes spiral drawing he has moderate difficulty with the left hand, mild difficulty with right hand. Handwriting with his dominant, right hand is somewhat difficult to read, on the smaller side, not particularly tremulous.)  He has a mild but intermittent resting tremor in the left hand, at times it is slight. He has no other resting tremor. He does have a slight postural tremor in the left more than right hand, no significant action tremor. He has no intention tremor.  Romberg is not tested due to safety concerns. Reflexes are 1+ in the upper extremities, trace in the knees and absent in the ankles. Fine motor skills and coordination: on finger taps he has moderate difficulty with the left and mildly so with the right hand, rapid alternating patting is moderately impaired on the left and mildly so on the right. Hand movements are mildly impaired on the left and fairly normal on the right. On foot agility and foot taps he has difficulty with the right foot because it has limited mobility in the ankle, left foot shows mild difficulty.  intact with normal finger taps, normal hand movements, normal rapid alternating patting, normal foot taps and normal foot agility.  Cerebellar testing: No dysmetria or intention tremor on finger to nose testing. Heel to shin is unremarkable bilaterally. There is no truncal or gait ataxia.  Sensory exam: intact to light touch in the upper and lower extremities.  Gait, station and balance: He stands with difficulty. No veering to one side is noted. No leaning to one side is noted. Posture is  moderately stooped. He walks without a walking aid, slowly and cautiously with decreased arm swing on the left noted. He has a more noticeable tremor in the left hand while walking.  Assessment and Plan:   In summary, [name] is a very pleasant 83 year old male with an underlying complex medical history of peptic ulcer disease with history of upper GI bleed, TIA, depression, degenerative disc disease in the neck, osteoarthritis, kidney stones, hyperlipidemia, hypertension, history of hiatal hernia, BPH, arthritis, including ankylosing spondylitis, for which he sees you in rheumatology, who presents for Follow-up consultation of his parkinsonism, probable left-sided predominant Parkinson's disease with symptoms dating back a few years, may be as long as 3+ years.  He has infrequent dream enactment behavior, no major issues in that regard, sleeps fairly well.  Since our virtual visit in April he has been on Sinemet successfully, has been able to titrate to 1 pill 3 times daily which he tolerates and has noted some benefit. His recent DaTscan from 01/07/2019 was supportive of parkinsonism, showing decreased activity within the LEFT and RIGHT posterior striata is a pattern typical of Parkinson's syndrome pathology.We talked about the importance of pursuing a healthy lifestyle, including physical activity, healthy nutrition, being proactive about constipation issues, and good hydration with water.  He is exercising in the form of walking nearly daily in the park if possible and weather permitting.  He is advised to continue to pursue social distancing and be cautious with travel.  He is advised to continue with Sinemet 1 pill 3 times daily.  I renewed his prescription for 3 months with refills.  I asked him to follow-up routinely in 6 months, sooner if needed.  I answered all their questions today and the patient and his wife are in agreement  I spent 40 minutes in total face-to-face time with the patient,  more than 50% of which was spent in counseling and coordination of care, reviewing test results, reviewing medication and discussing or reviewing the diagnosis of PD, its prognosis and treatment options. Pertinent laboratory and imaging test results that were available during this visit with the patient were reviewed by me and considered in my medical decision making (see chart for details).

## 2019-04-01 DIAGNOSIS — I8312 Varicose veins of left lower extremity with inflammation: Secondary | ICD-10-CM | POA: Diagnosis not present

## 2019-04-01 DIAGNOSIS — L570 Actinic keratosis: Secondary | ICD-10-CM | POA: Diagnosis not present

## 2019-04-01 DIAGNOSIS — L819 Disorder of pigmentation, unspecified: Secondary | ICD-10-CM | POA: Diagnosis not present

## 2019-04-01 DIAGNOSIS — Z85828 Personal history of other malignant neoplasm of skin: Secondary | ICD-10-CM | POA: Diagnosis not present

## 2019-04-13 DIAGNOSIS — I129 Hypertensive chronic kidney disease with stage 1 through stage 4 chronic kidney disease, or unspecified chronic kidney disease: Secondary | ICD-10-CM | POA: Diagnosis not present

## 2019-04-13 DIAGNOSIS — E785 Hyperlipidemia, unspecified: Secondary | ICD-10-CM | POA: Diagnosis not present

## 2019-04-13 DIAGNOSIS — N183 Chronic kidney disease, stage 3 (moderate): Secondary | ICD-10-CM | POA: Diagnosis not present

## 2019-04-15 DIAGNOSIS — Z23 Encounter for immunization: Secondary | ICD-10-CM | POA: Diagnosis not present

## 2019-05-11 DIAGNOSIS — Z6823 Body mass index (BMI) 23.0-23.9, adult: Secondary | ICD-10-CM | POA: Diagnosis not present

## 2019-05-11 DIAGNOSIS — M459 Ankylosing spondylitis of unspecified sites in spine: Secondary | ICD-10-CM | POA: Diagnosis not present

## 2019-05-11 DIAGNOSIS — M436 Torticollis: Secondary | ICD-10-CM | POA: Diagnosis not present

## 2019-05-11 DIAGNOSIS — M545 Low back pain: Secondary | ICD-10-CM | POA: Diagnosis not present

## 2019-05-11 DIAGNOSIS — Z79899 Other long term (current) drug therapy: Secondary | ICD-10-CM | POA: Diagnosis not present

## 2019-05-11 DIAGNOSIS — Z6824 Body mass index (BMI) 24.0-24.9, adult: Secondary | ICD-10-CM | POA: Diagnosis not present

## 2019-05-11 DIAGNOSIS — Z9181 History of falling: Secondary | ICD-10-CM | POA: Diagnosis not present

## 2019-05-11 DIAGNOSIS — M150 Primary generalized (osteo)arthritis: Secondary | ICD-10-CM | POA: Diagnosis not present

## 2019-06-09 ENCOUNTER — Other Ambulatory Visit: Payer: Self-pay | Admitting: Cardiology

## 2019-08-28 ENCOUNTER — Other Ambulatory Visit: Payer: Self-pay | Admitting: Cardiology

## 2019-09-27 ENCOUNTER — Encounter: Payer: Self-pay | Admitting: Neurology

## 2019-09-27 ENCOUNTER — Ambulatory Visit (INDEPENDENT_AMBULATORY_CARE_PROVIDER_SITE_OTHER): Payer: Medicare Other | Admitting: Neurology

## 2019-09-27 ENCOUNTER — Other Ambulatory Visit: Payer: Self-pay

## 2019-09-27 VITALS — BP 154/74 | HR 85 | Temp 97.7°F | Ht 66.5 in | Wt 156.0 lb

## 2019-09-27 DIAGNOSIS — G20 Parkinson's disease: Secondary | ICD-10-CM | POA: Diagnosis not present

## 2019-09-27 MED ORDER — CARBIDOPA-LEVODOPA 25-100 MG PO TABS: 1.0000 | ORAL_TABLET | Freq: Three times a day (TID) | ORAL | 3 refills | Status: DC

## 2019-09-27 NOTE — Progress Notes (Signed)
Subjective:    Patient ID: [name] is a 84 y.o. male.  HPI     Interim history:   Mr. Fish is a 84 year old right-handed gentleman with an underlying complex medical history of peptic ulcer disease with history of upper GI bleed, TIA, depression, degenerative disc disease in the neck, osteoarthritis, kidney stones, hyperlipidemia, hypertension, history of hiatal hernia, BPH, arthritis, and ankylosing spondylitis, who presents for follow-up consultation of his parkinsonism.  The patient is accompanied by his wife today. I last saw him on 03/24/2019, at which time he reported doing okay with Sinemet, he was taking it 3 times a day.  He had noticed some benefit including improvement in his mobility and balance.  He felt that perhaps his left hand tremor was a little better.  Today, 09/27/2019: He reports overall feeling quite stable but he did have a couple of falls in the past 6 months.  1 fall happened when they were walking outside in the park, it was uphill and uneven ground.  He did not injure himself but needed help getting up.  Another fall happened in the basement when he was helping with the laundry.  He tangled over his own feet.  He has had some confusion when he first wakes up, it takes him about a minute or 2 to get his bearings and feel more oriented.  He has not had any sustained issues with confusion or disorientation otherwise or hallucinations.  Constipation is intermittent but not severe.  He tries to hydrate better.  Wife believes that he could do better with his water intake still.  He tries to walk on a regular basis.  He is on Cosentyx.  He is followed by rheumatology.  He is scheduled to have his first Covid shot tomorrow.   : He reports being able to tolerate the Sinemet.  He is currently taking 1 pill 3 times daily.  He has noticed a little bit of improvement in his balance, ability to turn his neck side to side and lift his head up a little better, slight improvement in the  left hand tremor but not that much as far as the resting tremor goes in his wife thinks that the tremor is about the same.  Thankfully, he has not fallen.  He has no significant issues with constipation.  He tries to walk every day.  He has a good appetite and weight is stable.  He sleeps fairly well, has the occasional sleep talking and has had some rare instances of dream enactment behavior.  He denies any side effects with the Sinemet.    Previously:  I spoke to him on 12/23/2018 via phone visit, at which time we mutually agreed to start Sinemet with gradual titration.  He had to reschedule his DaTscan secondary to the virus pandemic.  He had his DaTscan in the interim on 01/07/2019 and I reviewed the results: IMPRESSION: Decreased activity within the LEFT and RIGHT posterior striata is a pattern typical of Parkinson's syndrome pathology.   We called him with his test results.      I first met him on 09/16/2018 at the request of his rheumatology PA, at which time he reported a bilateral hand tremor for the past at least one year, left side more noticeable than right. He had also increased difficulty with mobility. He had some findings concerning for parkinsonism. I suggested we proceed with a nuclear medicine DaT scan but this was delayed secondary to the coronavirus pandemic.  09/16/2018: (He) reports bilateral hand tremor, left more than right for the past at least one year with some progression noted. His wife has noted significant impairment in his mobility this time. He has a left more than right tremor, the left hand is the most significant. He has not noticed any lower extremity tremor. He does have restriction in his shoulder mobility, left more than right. He has no family history of Parkinson's disease, does not know much about his mother's history her family history, she died at 33, when he was 84 years old. His paternal grandmother lived to be above 28 years old. He had a sister who died  young at 31 from lupus, his brother has significant arthritis issues at age 31. He injured his right ankle and foot when he was a child and continues to have restricted movements in his right ankle. He has fallen a few times. He fell one or 2 times in the last 6 months, one time he fell in the bathroom and one time he fell in the closet. He has had intermittent numbness in both hands, particularly the right hand, particularly with certain movements or posture, such as while holding the steering wheel.    I reviewed your office note from 07/10/2018, which you kindly included. He has also had some balance issues. For his arthritis he takes Cosentyx injections monthly. Of note, he had a brain MRI without contrast as well as MRA head without contrast on 08/10/2015 and I reviewed the results: IMPRESSION: MRI HEAD IMPRESSION:   Negative brain MRI with no acute intracranial infarct or other process identified.   MRA HEAD IMPRESSION:   Negative intracranial MRA. No large or proximal arterial branch occlusion. No high-grade or correctable stenosis.   He is retired, lives with his wife, he quit smoking and drinks alcohol about once a week, caffeine in the form of coffee, 2-3 cups per day on average.   His Past Medical History Is Significant For: Past Medical History:  Diagnosis Date  . Ankylosing spondylitis ([place]) dx'd ~ 1974  . Arthritis    "back" (08/10/2015)  . BENIGN PROSTATIC HYPERTROPHY, WITH OBSTRUCTION 01/15/2010  . Bleeding duodenal ulcer   . Bleeding esophageal ulcer   . Bleeding stomach ulcer   . GERD 02/22/2008  . History of blood transfusion 1988   "lost ~ 1/2 of my blood volume from multiple bleeding ulcers"  . History of hiatal hernia   . HYPERGLYCEMIA 11/18/2007  . HYPERLIPIDEMIA 02/22/2008  . HYPERTENSION, UNSPECIFIED 11/10/2009  . Kidney stones   . Osteoarthritis    c-spine  . PEPTIC ULCER DISEASE 11/17/2007  . Situational depression    "son died in [place] 2015-06-28"  . TIA  (transient ischemic attack)    "not that I know of in the past; they are trying to determine if I've had one today (08/10/2015)"    His Past Surgical History Is Significant For: Past Surgical History:  Procedure Laterality Date  . CATARACT EXTRACTION W/ INTRAOCULAR LENS  IMPLANT, BILATERAL Bilateral 2013    His Family History Is Significant For: Family History  Problem Relation Age of Onset  . Appendicitis Mother        complications  . Colon cancer Father   . Diabetes type II Father   . Lupus Sister   . Colon cancer Other        1st degree relative <60    His Social History Is Significant For: Social History   Socioeconomic History  . Marital status:  Married    Spouse name: Not on file  . Number of children: Not on file  . Years of education: Not on file  . Highest education level: Not on file  Occupational History  . Not on file  Tobacco Use  . Smoking status: Former Smoker    Years: 15.00    Types: Cigars, Pipe    Quit date: 08/26/1978    Years since quitting: 41.1  . Smokeless tobacco: Never Used  Substance and Sexual Activity  . Alcohol use: Yes    Comment: 08/10/2015 "glass of wine ~ month or so; occasionally a can of beer"  . Drug use: No  . Sexual activity: Yes  Other Topics Concern  . Not on file  Social History Narrative   Retired and married.    Social Determinants of Health   Financial Resource Strain:   . Difficulty of Paying Living Expenses: Not on file  Food Insecurity:   . Worried About Charity fundraiser in the Last Year: Not on file  . Ran Out of Food in the Last Year: Not on file  Transportation Needs:   . Lack of Transportation (Medical): Not on file  . Lack of Transportation (Non-Medical): Not on file  Physical Activity:   . Days of Exercise per Week: Not on file  . Minutes of Exercise per Session: Not on file  Stress:   . Feeling of Stress : Not on file  Social Connections:   . Frequency of Communication with Friends and Family:  Not on file  . Frequency of Social Gatherings with Friends and Family: Not on file  . Attends Religious Services: Not on file  . Active Member of Clubs or Organizations: Not on file  . Attends Archivist Meetings: Not on file  . Marital Status: Not on file    His Allergies Are:  No Known Allergies:   His Current Medications Are:  Outpatient Encounter Medications as of 09/27/2019  Medication Sig  . aspirin 81 MG tablet Take 81 mg by mouth daily.    Marland Kitchen atorvastatin (LIPITOR) 10 MG tablet TAKE 1 TABLET BY MOUTH  DAILY  . carbidopa-levodopa (SINEMET IR) 25-100 MG tablet Take 1 tablet by mouth 3 (three) times daily.  . Cholecalciferol (VITAMIN D PO) Take 1,000 Units by mouth daily.  . famotidine (PEPCID) 20 MG tablet Take 20 mg by mouth 2 (two) times daily.   Marland Kitchen levothyroxine (SYNTHROID, LEVOTHROID) 25 MCG tablet Take 1 tablet (25 mcg total) by mouth daily before breakfast.  . losartan-hydrochlorothiazide (HYZAAR) 50-12.5 MG tablet TAKE ONE-HALF TABLET BY  MOUTH DAILY  . Multiple Vitamin (MULTIVITAMIN WITH MINERALS) TABS tablet Take 1 tablet by mouth daily.  . nitroGLYCERIN (NITROSTAT) 0.4 MG SL tablet Place 1 tablet (0.4 mg total) under the tongue every 5 (five) minutes as needed.  . Secukinumab (COSENTYX [place]) Inject into the skin.  Marland Kitchen sulindac (CLINORIL) 150 MG tablet Take 150 mg by mouth daily as needed.   No facility-administered encounter medications on file as of 09/27/2019.  :  Review of Systems:  Out of a complete 14 point review of systems, all are reviewed and negative with the exception of these symptoms as listed below:      Review of Systems  Neurological:       Here for 6 month f/u. Reports manuel dexterity has improved since last visit. He does report 2 falls since last visit but no injuries.     Objective:  Neurological Exam  Physical  Exam Physical Examination:   Vitals:   09/27/19 1446  BP: (!) 154/74  Pulse: 85  Temp: 97.7 F (36.5 C)    General  Examination: The patient is a very pleasant 84 y.o. male in no acute distress. He appears well-developed and well-nourished and well groomed.   HEENT:Normocephalic, atraumatic, pupils are equal, round and reactive to light and accommodation. Extraocular tracking is preserved fairly well. He has no nystagmus, hearing is mildly impaired. He has mild facial masking. He has moderate nuchal rigidity. He has restricted range of motion in the neck. He has no obvious lower jaw tremor. Mildhypophonia. There is no sialorrhea.There are no carotid bruits on auscultation. Oropharynx exam reveals:adequatedental hygiene, tongue protrudes centrally and palate elevates symmetrically.   Chest:Clear to auscultation without wheezing, rhonchi or crackles noted.  Heart:S1+S2+0, regular and normal without murmurs, rubs or gallops noted.   Abdomen:Soft, non-tender and non-distended with normal bowel sounds appreciated on auscultation.  Extremities:There isnopitting edema in the distal lower extremities bilaterally.   Skin: Warm and dry without trophic changes noted.  Musculoskeletal: exam revealslower back stiffness, reduced ROM in neck, restricted mobility in the left shoulder, arthritic changes in both hands.Restricted mobility in the right ankle from a childhood injury, all fairly stable.  Neurologically:  Mental status: The patient is awake, alert and oriented in all 4 spheres.Hisimmediate and remote memory, attention, language skills and fund of knowledge are appropriate. There is no evidence of aphasia, agnosia, apraxia or anomia. Speech is clear with normal prosody and enunciation. Thought process is linear. Mood is normaland affect is normal.  Cranial nerves II - XII are as described above under HEENT exam.  Motor exam: Normal bulk, strength andmildly increased tone noted in the left more than right upper extremity.  (On1/22/2020: on Archimedes spiral drawing he has moderate  difficulty with the left hand, mild difficulty with right hand. Handwriting with his dominant, right hand is somewhat difficult to read, on the smaller side, not particularly tremulous.)  He has amild but intermittent resting tremor in the left hand, at times it is slight. He has no other resting tremor. He does have a slight postural tremor in the left more than right hand, no significant action tremor. He has no intention tremor.  Romberg isnot tested due to safety concerns. Fine motor skills and coordination:on finger taps he has moderate difficulty with the left and mildly so with the right hand, rapid alternating patting is moderately impaired on the left and mildly so on the right. Hand movements are mildly impaired on the left and fairly normal on the right. On foot agility and foot taps he has difficulty with the right foot because it has limited mobility in the ankle, left foot shows mild difficulty.  intact with normal finger taps, normal hand movements, normal rapid alternating patting, normal foot taps and normal foot agility.  Cerebellar testing: No dysmetria or intention tremor on finger to nose testing. Heel to shin is unremarkable bilaterally. There is no truncal or gait ataxia.  Sensory exam: intact to light touch in the upper and lower extremities.  Gait, station and balance:Hestands with difficulty. No veering to one side is noted. No leaning to one side is noted. Posture ismoderately stooped. He walks without a walking aid, slowly and cautiously with decreased arm swing on the left noted. He has a more noticeable tremor in the left hand while walking. Gait and posture stable.  Assessmentand Plan:   In summary,Tymar F Mossis a very pleasant 85 year oldmalewith an  underlying complex medical history of peptic ulcer disease with history of upper GI bleed, TIA, depression, degenerative disc disease in the neck, osteoarthritis, kidney stones, hyperlipidemia, hypertension,  history of hiatal hernia, BPH, arthritis, including ankylosing spondylitis (followed by rheumatology), whopresents for Follow-up consultation of his parkinsonism, probable left-sided predominant Parkinson's disease with symptoms dating back a few years, may be as long as 3 to 4 years.  He has infrequent dream enactment behavior, no major issues in that regard, sleeps fairly well, Also intermittent issues with constipation.  He has been on Sinemet for nearly 1 year, has been able to tolerate 1 pill 3 times daily.  He has noted benefit from it.  He had a DaTscan in May 2020 which was supportive of an underlying parkinsonian syndrome with decreased activity within the left and right posterior striata. We talked todayAbout the importance of fall prevention.  He is advised to stay well-hydrated and well rested and stand up slowly and get his bearings first.  He is advised to continue with Sinemet 1 pill 3 times daily.  I renewed his prescription.  He is trying to stay active physically, we may be able to start therapy this summer.  We will talk about it next time.  He is due for his first Covid shot tomorrow.  He is advised to follow-up routinely in 6 months, sooner if needed.  I answered all their questions today and the patient and his wife are in agreement.  I spent 30 minutes in total face-to-face time and in reviewing records during pre-charting, more than 50% of which was spent in counseling and coordination of care, reviewing test results, reviewing medication and discussing or reviewing the diagnosis of PD, the prognosis and treatment options. Pertinent laboratory and imaging test results that were available during this visit with the patient were reviewed by me and considered in my medical decision making (see chart for details).

## 2019-09-27 NOTE — Patient Instructions (Signed)
Fall risk is real! Please remember to stand up slowly and get your bearings first turn slowly, no bending down to pick anything, no heavy lifting, be extra careful at night and first thing in the morning. Also, be careful in the Bathroom and the kitchen. Your exam is stable thankfully.  Please continue with your generic Sinemet 1 pill 3 times a day as you are.  Please follow-up routinely in 6 months, we will think about referral to therapy at the time.

## 2019-10-04 DIAGNOSIS — D485 Neoplasm of uncertain behavior of skin: Secondary | ICD-10-CM | POA: Diagnosis not present

## 2019-10-04 DIAGNOSIS — Z85828 Personal history of other malignant neoplasm of skin: Secondary | ICD-10-CM | POA: Diagnosis not present

## 2019-10-04 DIAGNOSIS — L905 Scar conditions and fibrosis of skin: Secondary | ICD-10-CM | POA: Diagnosis not present

## 2019-10-04 DIAGNOSIS — L218 Other seborrheic dermatitis: Secondary | ICD-10-CM | POA: Diagnosis not present

## 2019-10-04 DIAGNOSIS — L570 Actinic keratosis: Secondary | ICD-10-CM | POA: Diagnosis not present

## 2019-10-13 DIAGNOSIS — E038 Other specified hypothyroidism: Secondary | ICD-10-CM | POA: Diagnosis not present

## 2019-10-13 DIAGNOSIS — Z125 Encounter for screening for malignant neoplasm of prostate: Secondary | ICD-10-CM | POA: Diagnosis not present

## 2019-10-13 DIAGNOSIS — E7849 Other hyperlipidemia: Secondary | ICD-10-CM | POA: Diagnosis not present

## 2019-10-17 NOTE — Progress Notes (Signed)
CARDIOLOGY OFFICE NOTE  Date:  10/20/2019    [name] Date of Birth: Jul 21, 1936 Medical Record [id number]  PCP:  Leanna Battles, MD  Cardiologist:  Ivonne Freeburg Martin[place] MD   No chief complaint on file.   History of Present Illness: [name] is a 84 y.o. male who presents today for cardiac follow up.   He has a history of hyperlipidemia, HTN, and GERD. He has no known CAD. Negative stress echo in 2011. Does have known carotid disease -  study in 07/2014 with 40 to 59% RICA stenosis. Admitted in December 2016 with possible TIA. Echo was unremarkable. Carotid dopplers then showed only mild disease.   On follow up today he states he is doing fair. He still has paroxysms of cough several times at night. This did not improve with stopping ACEi. Dr Philip Aspen has increased his acid reflux therapy. He has been diagnosed with Parkinson's disease and is on Cosentyx.  No chest pain or dyspnea.,    Past Medical History:  Diagnosis Date  . Ankylosing spondylitis ([place]) dx'd ~ 1974  . Arthritis    "back" (08/10/2015)  . BENIGN PROSTATIC HYPERTROPHY, WITH OBSTRUCTION 01/15/2010  . Bleeding duodenal ulcer   . Bleeding esophageal ulcer   . Bleeding stomach ulcer   . GERD 02/22/2008  . History of blood transfusion 1988   "lost ~ 1/2 of my blood volume from multiple bleeding ulcers"  . History of hiatal hernia   . HYPERGLYCEMIA 11/18/2007  . HYPERLIPIDEMIA 02/22/2008  . HYPERTENSION, UNSPECIFIED 11/10/2009  . Kidney stones   . Osteoarthritis    c-spine  . PEPTIC ULCER DISEASE 11/17/2007  . Situational depression    "son died in [place] July 01, 2015"  . TIA (transient ischemic attack)    "not that I know of in the past; they are trying to determine if I've had one today (08/10/2015)"    Past Surgical History:  Procedure Laterality Date  . CATARACT EXTRACTION W/ INTRAOCULAR LENS  IMPLANT, BILATERAL Bilateral 2013     Medications: Current Outpatient Medications  Medication Sig Dispense Refill   . aspirin 81 MG tablet Take 81 mg by mouth daily.      Marland Kitchen atorvastatin (LIPITOR) 10 MG tablet TAKE 1 TABLET BY MOUTH  DAILY 90 tablet 3  . carbidopa-levodopa (SINEMET IR) 25-100 MG tablet Take 1 tablet by mouth 3 (three) times daily. 270 tablet 3  . Cholecalciferol (VITAMIN D PO) Take 1,000 Units by mouth daily.    . famotidine (PEPCID) 20 MG tablet Take 20 mg by mouth 2 (two) times daily.     Marland Kitchen levothyroxine (SYNTHROID, LEVOTHROID) 25 MCG tablet Take 1 tablet (25 mcg total) by mouth daily before breakfast. 90 tablet 3  . losartan-hydrochlorothiazide (HYZAAR) 50-12.5 MG tablet TAKE ONE-HALF TABLET BY  MOUTH DAILY 45 tablet 3  . Multiple Vitamin (MULTIVITAMIN WITH MINERALS) TABS tablet Take 1 tablet by mouth daily.    . nitroGLYCERIN (NITROSTAT) 0.4 MG SL tablet Place 1 tablet (0.4 mg total) under the tongue every 5 (five) minutes as needed. 25 tablet 6  . Secukinumab (COSENTYX [place]) Inject into the skin.    Marland Kitchen sulindac (CLINORIL) 150 MG tablet Take 150 mg by mouth daily as needed.     No current facility-administered medications for this visit.    Allergies: No Known Allergies  Social History: The patient  reports that he quit smoking about 41 years ago. His smoking use included cigars and pipe. He quit after 15.00 years of  use. He has never used smokeless tobacco. He reports current alcohol use. He reports that he does not use drugs.   Family History: The patient's family history includes Appendicitis in his mother; Colon cancer in his father and another family member; Diabetes type II in his father; Lupus in his sister.   Review of Systems: Please see the history of present illness.   All other systems are reviewed and negative.   Physical Exam: VS:  There were no vitals taken for this visit. Marland Kitchen  BMI There is no height or weight on file to calculate BMI.  Wt Readings from Last 3 Encounters:  09/27/19 156 lb (70.8 kg)  03/24/19 149 lb (67.6 kg)  10/06/18 146 lb 9.6 oz (66.5 kg)    GENERAL:  Well appearing WM in NAD HEENT:  PERRL, EOMI, sclera are clear. Oropharynx is clear. NECK:  No jugular venous distention, carotid upstroke brisk and symmetric, no bruits, no thyromegaly or adenopathy LUNGS:  Clear to auscultation bilaterally CHEST:  Kyphotic.  HEART:  RRR,  PMI not displaced or sustained,S1 and S2 within normal limits, no S3, no S4: no clicks, no rubs, no murmurs ABD:  Soft, nontender. BS +, no masses or bruits. No hepatomegaly, no splenomegaly EXT:  2 + pulses throughout, no edema, no cyanosis no clubbing SKIN:  Warm and dry.  No rashes NEURO:  Alert and oriented x 3. Cranial nerves II through XII intact. PSYCH:  Cognitively intact   LABORATORY DATA:  EKG:  EKG is  ordered today. It shows NSR rate 91. Normal Ecg. I have personally reviewed and interpreted this study.   Lab Results  Component Value Date   WBC 5.6 11/04/2017   HGB 12.4 (L) 11/04/2017   HCT 36.0 (L) 11/04/2017   PLT 189.0 11/04/2017   GLUCOSE 98 09/16/2018   CHOL 118 11/04/2017   TRIG 35.0 11/04/2017   HDL 68.70 11/04/2017   LDLCALC 42 11/04/2017   ALT 20 09/16/2018   AST 22 09/16/2018   NA 141 09/16/2018   K 4.9 09/16/2018   CL 102 09/16/2018   CREATININE 1.16 09/16/2018   BUN 25 09/16/2018   CO2 22 09/16/2018   TSH 3.29 12/25/2017   PSA 0.38 01/15/2010   INR 0.9 11/03/2007   HGBA1C 5.9 (H) 08/11/2015   Dated 05/29/17: creatinine 1.17. Hgb 12.4.  Dated 10/13/19: cholesterol 141, triglycerides 76, HDL 59, LDL 67. Creatinine 1.2. otherwise CMET, CBC, TFTs normal.  BNP (last 3 results) No results for input(s): BNP in the last 8760 hours.  ProBNP (last 3 results) No results for input(s): PROBNP in the last 8760 hours.   Other Studies Reviewed Today: Carotid dopplers 12/03/16: 1-39% bilateral carotid disease.  Assessment/Plan: 1. Carotid stenosis: mild by doppler in 2018.   2. Hyperlipidemia: on statin therapy. Excellent control    3. HTN: well controlled on current  medication. Continue.  4.. Ankylosing spondylitis  5. Parkinson's disease. Followed by Neuro.  Current medicines are reviewed with the patient today.  The patient does not have concerns regarding medicines other than what has been noted above.  The following changes have been made:  See above.  Labs/ tests ordered today include:    No orders of the defined types were placed in this encounter.  Follow up in one year  Signed: Jamin Humphries [place] MD, [facility]  10/20/2019 12:47 PM  [place]

## 2019-10-21 ENCOUNTER — Encounter: Payer: Self-pay | Admitting: Cardiology

## 2019-10-21 ENCOUNTER — Ambulatory Visit (INDEPENDENT_AMBULATORY_CARE_PROVIDER_SITE_OTHER): Payer: Medicare Other | Admitting: Cardiology

## 2019-10-21 ENCOUNTER — Other Ambulatory Visit: Payer: Self-pay

## 2019-10-21 VITALS — BP 126/60 | HR 91 | Temp 97.4°F | Ht 66.5 in

## 2019-10-21 DIAGNOSIS — I10 Essential (primary) hypertension: Secondary | ICD-10-CM

## 2019-10-21 DIAGNOSIS — I6523 Occlusion and stenosis of bilateral carotid arteries: Secondary | ICD-10-CM | POA: Diagnosis not present

## 2019-10-21 DIAGNOSIS — Z0000 Encounter for general adult medical examination without abnormal findings: Secondary | ICD-10-CM | POA: Diagnosis not present

## 2019-10-21 DIAGNOSIS — G20 Parkinson's disease: Secondary | ICD-10-CM | POA: Diagnosis not present

## 2019-10-21 DIAGNOSIS — E7800 Pure hypercholesterolemia, unspecified: Secondary | ICD-10-CM | POA: Diagnosis not present

## 2019-10-21 DIAGNOSIS — M459 Ankylosing spondylitis of unspecified sites in spine: Secondary | ICD-10-CM | POA: Diagnosis not present

## 2019-10-21 DIAGNOSIS — E039 Hypothyroidism, unspecified: Secondary | ICD-10-CM | POA: Diagnosis not present

## 2019-10-21 DIAGNOSIS — N1831 Chronic kidney disease, stage 3a: Secondary | ICD-10-CM | POA: Diagnosis not present

## 2019-10-21 DIAGNOSIS — I129 Hypertensive chronic kidney disease with stage 1 through stage 4 chronic kidney disease, or unspecified chronic kidney disease: Secondary | ICD-10-CM | POA: Diagnosis not present

## 2019-10-21 DIAGNOSIS — R05 Cough: Secondary | ICD-10-CM | POA: Diagnosis not present

## 2019-10-21 DIAGNOSIS — K219 Gastro-esophageal reflux disease without esophagitis: Secondary | ICD-10-CM | POA: Diagnosis not present

## 2019-10-26 DIAGNOSIS — D044 Carcinoma in situ of skin of scalp and neck: Secondary | ICD-10-CM | POA: Diagnosis not present

## 2019-11-08 DIAGNOSIS — M150 Primary generalized (osteo)arthritis: Secondary | ICD-10-CM | POA: Diagnosis not present

## 2019-11-08 DIAGNOSIS — E663 Overweight: Secondary | ICD-10-CM | POA: Diagnosis not present

## 2019-11-08 DIAGNOSIS — Z79899 Other long term (current) drug therapy: Secondary | ICD-10-CM | POA: Diagnosis not present

## 2019-11-08 DIAGNOSIS — Z6825 Body mass index (BMI) 25.0-25.9, adult: Secondary | ICD-10-CM | POA: Diagnosis not present

## 2019-11-08 DIAGNOSIS — M2550 Pain in unspecified joint: Secondary | ICD-10-CM | POA: Diagnosis not present

## 2019-11-08 DIAGNOSIS — M459 Ankylosing spondylitis of unspecified sites in spine: Secondary | ICD-10-CM | POA: Diagnosis not present

## 2019-11-10 ENCOUNTER — Encounter: Payer: Self-pay | Admitting: Physical Therapy

## 2019-11-10 ENCOUNTER — Ambulatory Visit: Payer: Medicare Other | Attending: Rheumatology | Admitting: Physical Therapy

## 2019-11-10 ENCOUNTER — Other Ambulatory Visit: Payer: Self-pay

## 2019-11-10 DIAGNOSIS — M542 Cervicalgia: Secondary | ICD-10-CM | POA: Diagnosis not present

## 2019-11-10 DIAGNOSIS — R293 Abnormal posture: Secondary | ICD-10-CM | POA: Insufficient documentation

## 2019-11-10 NOTE — Therapy (Signed)
[facility] [place], [place], [address] Phone: [phone]   Fax:  [phone]  Physical Therapy Evaluation  Patient Details  Name: [name] MRN: [id number] Date of Birth: 04/28/1936 Referring Provider (PT): Gavin Pound, MD   Encounter Date: 11/10/2019  PT End of Session - 11/10/19 1834    Visit Number  1    Number of Visits  6    Date for PT Re-Evaluation  12/29/19    PT Start Time  1030    PT Stop Time  1115    PT Time Calculation (min)  45 min    Activity Tolerance  Patient tolerated treatment well    Behavior During Therapy  [facility] for tasks assessed/performed       Past Medical History:  Diagnosis Date  . Ankylosing spondylitis ([place]) dx'd ~ 1974  . Arthritis    "back" (08/10/2015)  . BENIGN PROSTATIC HYPERTROPHY, WITH OBSTRUCTION 01/15/2010  . Bleeding duodenal ulcer   . Bleeding esophageal ulcer   . Bleeding stomach ulcer   . GERD 02/22/2008  . History of blood transfusion 1988   "lost ~ 1/2 of my blood volume from multiple bleeding ulcers"  . History of hiatal hernia   . HYPERGLYCEMIA 11/18/2007  . HYPERLIPIDEMIA 02/22/2008  . HYPERTENSION, UNSPECIFIED 11/10/2009  . Kidney stones   . Osteoarthritis    c-spine  . PEPTIC ULCER DISEASE 11/17/2007  . Situational depression    "son died in [place] Jun 20, 2015"  . TIA (transient ischemic attack)    "not that I know of in the past; they are trying to determine if I've had one today (08/10/2015)"    Past Surgical History:  Procedure Laterality Date  . CATARACT EXTRACTION W/ INTRAOCULAR LENS  IMPLANT, BILATERAL Bilateral 2013    There were no vitals filed for this visit.   Subjective Assessment - 11/10/19 1829    Subjective  COVID-19 screening performed upon arrival.Patient arrives to physical therapy with reports of cervical and upper thoracic pain and loss of range of motion that limits his ability to perform ADLs. Patient has a longstanding history of cervical  pain due to ankylosing spondylitis. Patient reports increasing "popping and cracking" sensation in cervical spine with motion. Patient reports having an HEP from last therapy session however he is not consistent with performing. Patient reports pain at worst as 7/10 and pain at best as 2/10. Patient's goals are to decrease pain, improve movement, and continue exercise.    Pertinent History  HTN, Parkinson's disease, ankylosing spondylitis    Patient Stated Goals  return to exercising    Currently in Pain?  Yes    Pain Score  5     Pain Location  Neck    Pain Orientation  Posterior    Pain Type  Chronic pain    Pain Onset  More than a month ago    Pain Frequency  Constant    Aggravating Factors   extensive movements    Pain Relieving Factors  rest, hot and cold packs, pain medication         [facility] PT Assessment - 11/10/19 0001      Assessment   Medical Diagnosis  Ankylosing spondylitis    Referring Provider (PT)  Gavin Pound, MD    Onset Date/Surgical Date  --   ongoing   Hand Dominance  Right    Next MD Visit  September 2021    Prior Therapy  yes      Precautions  Precautions  Fall      Restrictions   Weight Bearing Restrictions  No      Balance Screen   Has the patient fallen in the past 6 months  Yes    How many times?  2    Has the patient had a decrease in activity level because of a fear of falling?   No    Is the patient reluctant to leave their home because of a fear of falling?   No      Home Environment   Living Environment  Private residence    Living Arrangements  Spouse/significant other      Prior Function   Level of Independence  Independent      Posture/Postural Control   Posture/Postural Control  Postural limitations    Postural Limitations  Rounded Shoulders;Forward head;Flexed trunk      ROM / Strength   AROM / PROM / Strength  AROM      AROM   AROM Assessment Site  Cervical    Cervical Flexion  45    Cervical Extension  15    Cervical -  Right Side Bend  unable    Cervical - Left Side Bend  unable    Cervical - Right Rotation  44    Cervical - Left Rotation  41      Ambulation/Gait   Gait Pattern  Step-to pattern;Step-through pattern;Decreased stride length;Shuffle;Trunk flexed                Objective measurements completed on examination: See above findings.              PT Education - 11/10/19 1836    Education Details  chin tuck. corner stretch, T stretch no foam roll, doorway stretch, rows, horizontal abduction    Person(s) Educated  Patient    Methods  Explanation;Demonstration;Handout    Comprehension  Verbalized understanding;Returned demonstration          PT Long Term Goals - 11/10/19 1850      PT LONG TERM GOAL #1   Title  Patient will be independent with HEP    Time  6    Period  Weeks    Status  New      PT LONG TERM GOAL #2   Title  Patient will demonstrate 50 degrees or greater of bilateral cervical rotation to improve ability to scan environment    Time  6    Period  Weeks    Status  New      PT LONG TERM GOAL #3   Title  Patient will report ability to perform ADLs with cervical pain less than or equal to 3/10    Time  6    Period  Weeks    Status  New             Plan - 11/10/19 1909    Clinical Impression Statement  Patient is an 84 year old male who presents to physical therapy with decreased cervical motion and cervicothoracic pain secondary to a chronic history of ankylosing spondylitis. Patient noted with forward head, rounded shoulders, and a forward flexed trunk. Patient noted with decreased shoulder AROM as well.  Patient and PT discussed HEP as well as plan of care for skilled physical therapy. Patient and PT discussed looking for a gym to enroll with at discharge as patient was inquiring about our gym program that has closed due to COVID-19.  Patient would benefit from skilled physical therapy to  address deficits and address patient's goals.     Personal Factors and Comorbidities  Age;Comorbidity 3+;Time since onset of injury/illness/exacerbation    Comorbidities  HTN, ankylosing spondylitis, Parkinson's Disease    Examination-Activity Limitations  Lift    Examination-Participation Restrictions  Driving    Stability/Clinical Decision Making  Stable/Uncomplicated    Clinical Decision Making  Low    Rehab Potential  Poor    PT Frequency  1x / week    PT Duration  6 weeks    PT Treatment/Interventions  ADLs/Self Care Home Management;Electrical Stimulation;Moist Heat;Therapeutic exercise;Neuromuscular re-education;Passive range of motion;Therapeutic activities;Functional mobility training;Patient/family education    PT Next Visit Plan  [place], UBE, cervical ROM TEs, postural TEs, stretching    PT Home Exercise Plan  see patient education section    Consulted and Agree with Plan of Care  Patient       Patient will benefit from skilled therapeutic intervention in order to improve the following deficits and impairments:  Decreased activity tolerance, Decreased strength, Decreased range of motion, Pain, Postural dysfunction  Visit Diagnosis: Cervicalgia  Abnormal posture     Problem List Patient Active Problem List   Diagnosis Date Noted  . Ankylosing spondylitis of cervicothoracic region ([place]) 10/16/2016  . TIA (transient ischemic attack) 08/10/2015  . Carotid stenosis 08/04/2014  . Hyperlipidemia 02/21/2012  . BENIGN PROSTATIC HYPERTROPHY, WITH OBSTRUCTION 01/15/2010  . Essential hypertension 11/10/2009  . GERD 02/22/2008  . NEPHROLITHIASIS, HX OF 11/17/2007    Gabriela Eves, PT, DPT 11/10/2019, 8:23 PM  [facility] [place], [place], [address] Phone: [phone]   Fax:  [phone]  Name: [name] MRN: [id number] Date of Birth: [birth year]

## 2019-11-17 ENCOUNTER — Encounter: Payer: Self-pay | Admitting: Physical Therapy

## 2019-11-17 ENCOUNTER — Ambulatory Visit: Payer: Medicare Other | Admitting: Physical Therapy

## 2019-11-17 ENCOUNTER — Other Ambulatory Visit: Payer: Self-pay

## 2019-11-17 DIAGNOSIS — M542 Cervicalgia: Secondary | ICD-10-CM | POA: Diagnosis not present

## 2019-11-17 DIAGNOSIS — R293 Abnormal posture: Secondary | ICD-10-CM

## 2019-11-17 NOTE — Therapy (Signed)
[facility] [place], [place], [address] Phone: [phone]   Fax:  [phone]  Physical Therapy Treatment  Patient Details  Name: [name] MRN: [id number] Date of Birth: July 06, 1936 Referring Provider (PT): Gavin Pound, MD   Encounter Date: 11/17/2019  PT End of Session - 11/17/19 1049    Visit Number  2    Number of Visits  6    Date for PT Re-Evaluation  12/29/19    PT Start Time  1030    PT Stop Time  1114    PT Time Calculation (min)  44 min    Activity Tolerance  Patient tolerated treatment well    Behavior During Therapy  [facility] for tasks assessed/performed       Past Medical History:  Diagnosis Date  . Ankylosing spondylitis ([place]) dx'd ~ 1974  . Arthritis    "back" (08/10/2015)  . BENIGN PROSTATIC HYPERTROPHY, WITH OBSTRUCTION 01/15/2010  . Bleeding duodenal ulcer   . Bleeding esophageal ulcer   . Bleeding stomach ulcer   . GERD 02/22/2008  . History of blood transfusion 1988   "lost ~ 1/2 of my blood volume from multiple bleeding ulcers"  . History of hiatal hernia   . HYPERGLYCEMIA 11/18/2007  . HYPERLIPIDEMIA 02/22/2008  . HYPERTENSION, UNSPECIFIED 11/10/2009  . Kidney stones   . Osteoarthritis    c-spine  . PEPTIC ULCER DISEASE 11/17/2007  . Situational depression    "son died in [place] 2015/06/21"  . TIA (transient ischemic attack)    "not that I know of in the past; they are trying to determine if I've had one today (08/10/2015)"    Past Surgical History:  Procedure Laterality Date  . CATARACT EXTRACTION W/ INTRAOCULAR LENS  IMPLANT, BILATERAL Bilateral 2013    There were no vitals filed for this visit.  Subjective Assessment - 11/17/19 1032    Subjective  COVID-19 screening performed upon arrival. Reports getting a shingles shot yesterday therefore left arm is a little sore.    Pertinent History  HTN, Parkinson's disease, ankylosing spondylitis    Patient Stated Goals  return to exercising     Currently in Pain?  Yes   did not provide number on pain scale        [facility] PT Assessment - 11/17/19 0001      Assessment   Medical Diagnosis  Ankylosing spondylitis    Referring Provider (PT)  Gavin Pound, MD    Hand Dominance  Right    Next MD Visit  September 2021    Prior Therapy  yes      Precautions   Precautions  [facility] Adult PT Treatment/Exercise - 11/17/19 0001      Exercises   Exercises  Neck;Shoulder      Neck Exercises: Machines for Strengthening   UBE (Upper Arm Bike)  120 RPM x8 mins    Nustep  level 4 x10 mins UE and LEs      Neck Exercises: Theraband   Shoulder Extension  20 reps    Shoulder Extension Limitations  Green XTS    Rows  20 reps    Rows Limitations  Green XTS    Other Theraband Exercises  chest press green XTS 2x10      Shoulder Exercises: ROM/Strengthening   "W" Arms  2x10 with ball for chin tuck    X to V Arms  2x10 with ball for chin tuck    Other ROM/Strengthening Exercises  horizontal abduction with ball for chin tuck 2x10      Shoulder Exercises: Stretch   Corner Stretch  3 reps;30 seconds                  PT Long Term Goals - 11/10/19 1850      PT LONG TERM GOAL #1   Title  Patient will be independent with HEP    Time  6    Period  Weeks    Status  New      PT LONG TERM GOAL #2   Title  Patient will demonstrate 50 degrees or greater of bilateral cervical rotation to improve ability to scan environment    Time  6    Period  Weeks    Status  New      PT LONG TERM GOAL #3   Title  Patient will report ability to perform ADLs with cervical pain less than or equal to 3/10    Time  6    Period  Weeks    Status  New            Plan - 11/17/19 1054    Clinical Impression Statement  Patient responded fairly well with therapy session despite pain in left shoulder secondary to shingles vaccine. Patient required verbal and tacile cuing for form and technique. Patient's  carryover of cuing is fair for remaining reps. Patient instructed to continue HEP provided to which he reported understanding.    Personal Factors and Comorbidities  Age;Comorbidity 3+;Time since onset of injury/illness/exacerbation    Comorbidities  HTN, ankylosing spondylitis, Parkinson's Disease    Examination-Activity Limitations  Lift    Examination-Participation Restrictions  Driving    Stability/Clinical Decision Making  Stable/Uncomplicated    Clinical Decision Making  Low    Rehab Potential  Poor    PT Frequency  1x / week    PT Duration  6 weeks    PT Treatment/Interventions  ADLs/Self Care Home Management;Electrical Stimulation;Moist Heat;Therapeutic exercise;Neuromuscular re-education;Passive range of motion;Therapeutic activities;Functional mobility training;Patient/family education    PT Next Visit Plan  Nustep, UBE, cervical ROM TEs, postural TEs, stretching    Consulted and Agree with Plan of Care  Patient       Patient will benefit from skilled therapeutic intervention in order to improve the following deficits and impairments:  Decreased activity tolerance, Decreased strength, Decreased range of motion, Pain, Postural dysfunction  Visit Diagnosis: Cervicalgia  Abnormal posture     Problem List Patient Active Problem List   Diagnosis Date Noted  . Ankylosing spondylitis of cervicothoracic region ([place]) 10/16/2016  . TIA (transient ischemic attack) 08/10/2015  . Carotid stenosis 08/04/2014  . Hyperlipidemia 02/21/2012  . BENIGN PROSTATIC HYPERTROPHY, WITH OBSTRUCTION 01/15/2010  . Essential hypertension 11/10/2009  . GERD 02/22/2008  . NEPHROLITHIASIS, HX OF 11/17/2007    Gabriela Eves, PT, DPT 11/17/2019, 12:47 PM  [facility] [place], [place], [address] Phone: [phone]   Fax:  [phone]  Name: [name] MRN: [id number] Date of Birth: 10-03-1935

## 2019-11-24 ENCOUNTER — Other Ambulatory Visit: Payer: Self-pay

## 2019-11-24 ENCOUNTER — Ambulatory Visit: Payer: Medicare Other | Admitting: Physical Therapy

## 2019-11-24 DIAGNOSIS — R293 Abnormal posture: Secondary | ICD-10-CM | POA: Diagnosis not present

## 2019-11-24 DIAGNOSIS — M542 Cervicalgia: Secondary | ICD-10-CM

## 2019-11-24 NOTE — Therapy (Signed)
[facility] [place], [place], [address] Phone: [phone]   Fax:  [phone]  Physical Therapy Treatment  Patient Details  Name: [name] MRN: [id number] Date of Birth: Oct 11, 1935 Referring Provider (PT): Gavin Pound, MD   Encounter Date: 11/24/2019  PT End of Session - 11/24/19 1107    Visit Number  3    Number of Visits  6    Date for PT Re-Evaluation  12/29/19    PT Start Time  1030    PT Stop Time  1116    PT Time Calculation (min)  46 min    Activity Tolerance  Patient tolerated treatment well    Behavior During Therapy  [facility] for tasks assessed/performed       Past Medical History:  Diagnosis Date  . Ankylosing spondylitis ([place]) dx'd ~ 1974  . Arthritis    "back" (08/10/2015)  . BENIGN PROSTATIC HYPERTROPHY, WITH OBSTRUCTION 01/15/2010  . Bleeding duodenal ulcer   . Bleeding esophageal ulcer   . Bleeding stomach ulcer   . GERD 02/22/2008  . History of blood transfusion 1988   "lost ~ 1/2 of my blood volume from multiple bleeding ulcers"  . History of hiatal hernia   . HYPERGLYCEMIA 11/18/2007  . HYPERLIPIDEMIA 02/22/2008  . HYPERTENSION, UNSPECIFIED 11/10/2009  . Kidney stones   . Osteoarthritis    c-spine  . PEPTIC ULCER DISEASE 11/17/2007  . Situational depression    "son died in [place] 07-01-2015"  . TIA (transient ischemic attack)    "not that I know of in the past; they are trying to determine if I've had one today (08/10/2015)"    Past Surgical History:  Procedure Laterality Date  . CATARACT EXTRACTION W/ INTRAOCULAR LENS  IMPLANT, BILATERAL Bilateral 2013    There were no vitals filed for this visit.  Subjective Assessment - 11/24/19 1055    Subjective  COVID-19 screening performed upon arrival. Patient arrives with no new complaints , with a little bit of pain in the neck    Pertinent History  HTN, Parkinson's disease, ankylosing spondylitis    Patient Stated Goals  return to exercising     Currently in Pain?  Yes   did not provide number on pain scale        [facility] PT Assessment - 11/24/19 0001      Assessment   Medical Diagnosis  Ankylosing spondylitis    Referring Provider (PT)  Gavin Pound, MD    Hand Dominance  Right    Next MD Visit  September 2021    Prior Therapy  yes      Precautions   Precautions  [facility] Adult PT Treatment/Exercise - 11/24/19 0001      Exercises   Exercises  Neck;Shoulder      Neck Exercises: Machines for Strengthening   UBE (Upper Arm Bike)  120 RPM x8 mins    Nustep  level 4 x10 mins UE and LEs      Neck Exercises: Theraband   Shoulder Extension  20 reps;10 reps    Shoulder Extension Limitations  Green XTS 2x15    Rows  20 reps;10 reps    Rows Limitations  Green XTS 2x15    Other Theraband Exercises  chest press green XTS 2x15    Other Theraband Exercises  chop/lift 2x15 each green XTS      Shoulder Exercises:  Standing   Other Standing Exercises  step outs with 4# ball 2x15 in parallel bars for safety      Shoulder Exercises: Pulleys   Flexion  3 minutes                  PT Long Term Goals - 11/10/19 1850      PT LONG TERM GOAL #1   Title  Patient will be independent with HEP    Time  6    Period  Weeks    Status  New      PT LONG TERM GOAL #2   Title  Patient will demonstrate 50 degrees or greater of bilateral cervical rotation to improve ability to scan environment    Time  6    Period  Weeks    Status  New      PT LONG TERM GOAL #3   Title  Patient will report ability to perform ADLs with cervical pain less than or equal to 3/10    Time  6    Period  Weeks    Status  New            Plan - 11/24/19 1131    Clinical Impression Statement  Patient responded fairly well to therapy session. Patient required cuing for form and technique but limited secondary posture. Patient guided through TEs with no complaints. Step outs with 4# medicine ball performed in  parallel bars for safety.    Personal Factors and Comorbidities  Age;Comorbidity 3+;Time since onset of injury/illness/exacerbation    Comorbidities  HTN, ankylosing spondylitis, Parkinson's Disease    Examination-Activity Limitations  Lift    Examination-Participation Restrictions  Driving    Stability/Clinical Decision Making  Stable/Uncomplicated    Clinical Decision Making  Low    Rehab Potential  Poor    PT Frequency  1x / week    PT Duration  6 weeks    PT Treatment/Interventions  ADLs/Self Care Home Management;Electrical Stimulation;Moist Heat;Therapeutic exercise;Neuromuscular re-education;Passive range of motion;Therapeutic activities;Functional mobility training;Patient/family education    PT Next Visit Plan  Nustep, UBE, cervical ROM TEs, postural TEs, stretching    PT Home Exercise Plan  see patient education section    Consulted and Agree with Plan of Care  Patient       Patient will benefit from skilled therapeutic intervention in order to improve the following deficits and impairments:  Decreased activity tolerance, Decreased strength, Decreased range of motion, Pain, Postural dysfunction  Visit Diagnosis: Cervicalgia  Abnormal posture     Problem List Patient Active Problem List   Diagnosis Date Noted  . Ankylosing spondylitis of cervicothoracic region ([place]) 10/16/2016  . TIA (transient ischemic attack) 08/10/2015  . Carotid stenosis 08/04/2014  . Hyperlipidemia 02/21/2012  . BENIGN PROSTATIC HYPERTROPHY, WITH OBSTRUCTION 01/15/2010  . Essential hypertension 11/10/2009  . GERD 02/22/2008  . NEPHROLITHIASIS, HX OF 11/17/2007    Gabriela Eves, PT, DPT 11/24/2019, 11:34 AM  [facility] [address] [place], [place], [address] Phone: [phone]   Fax:  [phone]  Name: [name] MRN: [id number] Date of Birth: Apr 05, 1936

## 2019-12-01 ENCOUNTER — Other Ambulatory Visit: Payer: Self-pay

## 2019-12-01 ENCOUNTER — Encounter: Payer: Self-pay | Admitting: Physical Therapy

## 2019-12-01 ENCOUNTER — Ambulatory Visit: Payer: Medicare Other | Attending: Rheumatology | Admitting: Physical Therapy

## 2019-12-01 DIAGNOSIS — R293 Abnormal posture: Secondary | ICD-10-CM

## 2019-12-01 DIAGNOSIS — M542 Cervicalgia: Secondary | ICD-10-CM | POA: Insufficient documentation

## 2019-12-01 NOTE — Therapy (Signed)
[facility] [place], [place], [address] Phone: [phone]   Fax:  [phone]  Physical Therapy Treatment  Patient Details  Name: [name] MRN: [id number] Date of Birth: December 08, 1935 Referring Provider (PT): Gavin Pound, MD   Encounter Date: 12/01/2019  PT End of Session - 12/01/19 1050    Visit Number  4    Number of Visits  6    Date for PT Re-Evaluation  12/29/19    PT Start Time  1030    PT Stop Time  1116    PT Time Calculation (min)  46 min    Activity Tolerance  Patient tolerated treatment well    Behavior During Therapy  [facility] for tasks assessed/performed       Past Medical History:  Diagnosis Date  . Ankylosing spondylitis ([place]) dx'd ~ 1974  . Arthritis    "back" (08/10/2015)  . BENIGN PROSTATIC HYPERTROPHY, WITH OBSTRUCTION 01/15/2010  . Bleeding duodenal ulcer   . Bleeding esophageal ulcer   . Bleeding stomach ulcer   . GERD 02/22/2008  . History of blood transfusion 1988   "lost ~ 1/2 of my blood volume from multiple bleeding ulcers"  . History of hiatal hernia   . HYPERGLYCEMIA 11/18/2007  . HYPERLIPIDEMIA 02/22/2008  . HYPERTENSION, UNSPECIFIED 11/10/2009  . Kidney stones   . Osteoarthritis    c-spine  . PEPTIC ULCER DISEASE 11/17/2007  . Situational depression    "son died in [place] Jul 03, 2015"  . TIA (transient ischemic attack)    "not that I know of in the past; they are trying to determine if I've had one today (08/10/2015)"    Past Surgical History:  Procedure Laterality Date  . CATARACT EXTRACTION W/ INTRAOCULAR LENS  IMPLANT, BILATERAL Bilateral 2013    There were no vitals filed for this visit.  Subjective Assessment - 12/01/19 1043    Subjective  COVID-19 screening performed upon arrival. Patient reports feeling "fine" today.    Pertinent History  HTN, Parkinson's disease, ankylosing spondylitis    Patient Stated Goals  return to exercising         [facility] PT Assessment - 12/01/19  0001      Assessment   Medical Diagnosis  Ankylosing spondylitis    Referring Provider (PT)  Gavin Pound, MD    Hand Dominance  Right    Next MD Visit  September 2021    Prior Therapy  yes      Precautions   Precautions  [facility] Adult PT Treatment/Exercise - 12/01/19 0001      Exercises   Exercises  Neck;Shoulder      Neck Exercises: Machines for Strengthening   UBE (Upper Arm Bike)  120 RPM x10 mins 5' fwd, 5' bwd    Nustep  level 5 x10 mins UE and LEs      Neck Exercises: Theraband   Shoulder Extension  20 reps;10 reps    Shoulder Extension Limitations  Blue XTS 2x20    Rows  20 reps;10 reps    Rows Limitations  Blue XTS 2x20    Other Theraband Exercises  chest press BlueXTS 2x20    Other Theraband Exercises  chop/lift 2x20 each blueXTS      Shoulder Exercises: Pulleys   Flexion  3 minutes      Shoulder Exercises: ROM/Strengthening   Other ROM/Strengthening Exercises  horizontal abduction with bolster  2x20 yellow theraband                  PT Long Term Goals - 11/10/19 1850      PT LONG TERM GOAL #1   Title  Patient will be independent with HEP    Time  6    Period  Weeks    Status  New      PT LONG TERM GOAL #2   Title  Patient will demonstrate 50 degrees or greater of bilateral cervical rotation to improve ability to scan environment    Time  6    Period  Weeks    Status  New      PT LONG TERM GOAL #3   Title  Patient will report ability to perform ADLs with cervical pain less than or equal to 3/10    Time  6    Period  Weeks    Status  New            Plan - 12/01/19 1105    Clinical Impression Statement  Patient responded well to the progression of reps and resistance. Patient provided with intermittent cuing for form; carryover throughout exercise was fair. Patient responded better to tacile cuing than visual cuing.    Personal Factors and Comorbidities  Age;Comorbidity 3+;Time since onset of  injury/illness/exacerbation    Comorbidities  HTN, ankylosing spondylitis, Parkinson's Disease    Examination-Activity Limitations  Lift    Examination-Participation Restrictions  Driving    Stability/Clinical Decision Making  Stable/Uncomplicated    Clinical Decision Making  Low    Rehab Potential  Poor    PT Frequency  1x / week    PT Duration  6 weeks    PT Treatment/Interventions  ADLs/Self Care Home Management;Electrical Stimulation;Moist Heat;Therapeutic exercise;Neuromuscular re-education;Passive range of motion;Therapeutic activities;Functional mobility training;Patient/family education    PT Next Visit Plan  Nustep, UBE, cervical ROM TEs, postural TEs, stretching    PT Home Exercise Plan  see patient education section    Consulted and Agree with Plan of Care  Patient       Patient will benefit from skilled therapeutic intervention in order to improve the following deficits and impairments:  Decreased activity tolerance, Decreased strength, Decreased range of motion, Pain, Postural dysfunction  Visit Diagnosis: Cervicalgia  Abnormal posture     Problem List Patient Active Problem List   Diagnosis Date Noted  . Ankylosing spondylitis of cervicothoracic region ([place]) 10/16/2016  . TIA (transient ischemic attack) 08/10/2015  . Carotid stenosis 08/04/2014  . Hyperlipidemia 02/21/2012  . BENIGN PROSTATIC HYPERTROPHY, WITH OBSTRUCTION 01/15/2010  . Essential hypertension 11/10/2009  . GERD 02/22/2008  . NEPHROLITHIASIS, HX OF 11/17/2007    Gabriela Eves, PT, DPT 12/01/2019, 11:38 AM  [facility] [address] [place], [place], [address] Phone: [phone]   Fax:  [phone]  Name: [name] MRN: [id number] Date of Birth: 10-03-1935

## 2019-12-07 DIAGNOSIS — L905 Scar conditions and fibrosis of skin: Secondary | ICD-10-CM | POA: Diagnosis not present

## 2019-12-07 DIAGNOSIS — Z85828 Personal history of other malignant neoplasm of skin: Secondary | ICD-10-CM | POA: Diagnosis not present

## 2019-12-08 ENCOUNTER — Other Ambulatory Visit: Payer: Self-pay

## 2019-12-08 ENCOUNTER — Ambulatory Visit: Payer: Medicare Other | Admitting: Physical Therapy

## 2019-12-08 DIAGNOSIS — R293 Abnormal posture: Secondary | ICD-10-CM | POA: Diagnosis not present

## 2019-12-08 DIAGNOSIS — M542 Cervicalgia: Secondary | ICD-10-CM

## 2019-12-08 NOTE — Therapy (Addendum)
[facility] [place], [place], [address] Phone: [phone]   Fax:  [phone]  Physical Therapy Treatment  Patient Details  Name: [name] MRN: [id number] Date of Birth: 1936-03-12 Referring Provider (PT): Gavin Pound, MD   Encounter Date: 12/08/2019  PT End of Session - 12/08/19 1033    Visit Number  5    Number of Visits  6    Date for PT Re-Evaluation  12/29/19    PT Start Time  5170    PT Stop Time  1116    PT Time Calculation (min)  39 min    Activity Tolerance  Patient tolerated treatment well    Behavior During Therapy  [facility] for tasks assessed/performed       Past Medical History:  Diagnosis Date  . Ankylosing spondylitis ([place]) dx'd ~ 1974  . Arthritis    "back" (08/10/2015)  . BENIGN PROSTATIC HYPERTROPHY, WITH OBSTRUCTION 01/15/2010  . Bleeding duodenal ulcer   . Bleeding esophageal ulcer   . Bleeding stomach ulcer   . GERD 02/22/2008  . History of blood transfusion 1988   "lost ~ 1/2 of my blood volume from multiple bleeding ulcers"  . History of hiatal hernia   . HYPERGLYCEMIA 11/18/2007  . HYPERLIPIDEMIA 02/22/2008  . HYPERTENSION, UNSPECIFIED 11/10/2009  . Kidney stones   . Osteoarthritis    c-spine  . PEPTIC ULCER DISEASE 11/17/2007  . Situational depression    "son died in [place] Jun 19, 2015"  . TIA (transient ischemic attack)    "not that I know of in the past; they are trying to determine if I've had one today (08/10/2015)"    Past Surgical History:  Procedure Laterality Date  . CATARACT EXTRACTION W/ INTRAOCULAR LENS  IMPLANT, BILATERAL Bilateral 2013    There were no vitals filed for this visit.  Subjective Assessment - 12/08/19 1409    Subjective  COVID-19 screening performed upon arrival. Patient arrives with no new complaints    Pertinent History  HTN, Parkinson's disease, ankylosing spondylitis    Patient Stated Goals  return to exercising    Currently in Pain?  Yes   did not  provide number on pain scale        [facility] PT Assessment - 12/08/19 0001      Assessment   Medical Diagnosis  Ankylosing spondylitis    Referring Provider (PT)  Gavin Pound, MD    Hand Dominance  Right    Next MD Visit  September 2021    Prior Therapy  yes      Precautions   Precautions  [facility] Adult PT Treatment/Exercise - 12/08/19 0001      Neck Exercises: Machines for Strengthening   UBE (Upper Arm Bike)  90 RPM x10 mins 5' fwd, 5' bwd    Nustep  level 5 x13 mins UE and LEs      Neck Exercises: Theraband   Other Theraband Exercises  chop/lift 2x25 each blueXTS      Shoulder Exercises: Seated   Other Seated Exercises  lat pull down blue XTS 2x20      Shoulder Exercises: Pulleys   Flexion  3 minutes      Shoulder Exercises: ROM/Strengthening   Cybex Press  2 plate;20 reps    Cybex Row  2 plate;20 reps  PT Long Term Goals - 12/08/19 1059      PT LONG TERM GOAL #1   Title  Patient will be independent with HEP    Time  6    Period  Weeks    Status  Achieved      PT LONG TERM GOAL #2   Title  Patient will demonstrate 50 degrees or greater of bilateral cervical rotation to improve ability to scan environment    Time  6    Period  Weeks    Status  Not Met      PT LONG TERM GOAL #3   Title  Patient will report ability to perform ADLs with cervical pain less than or equal to 3/10    Time  6    Period  Weeks    Status  Partially Met            Plan - 12/08/19 1256    Clinical Impression Statement  Patient responded well to therapy with the addition of strengthening machines. Patient provided with intermittent cuing for form and technique. Patient's goals partially met. Patient to be discharged next visit with HEP.    Personal Factors and Comorbidities  Age;Comorbidity 3+;Time since onset of injury/illness/exacerbation    Comorbidities  HTN, ankylosing spondylitis, Parkinson's Disease     Examination-Activity Limitations  Lift    Examination-Participation Restrictions  Driving    Stability/Clinical Decision Making  Stable/Uncomplicated    Clinical Decision Making  Low    Rehab Potential  Poor    PT Frequency  1x / week    PT Duration  6 weeks    PT Treatment/Interventions  ADLs/Self Care Home Management;Electrical Stimulation;Moist Heat;Therapeutic exercise;Neuromuscular re-education;Passive range of motion;Therapeutic activities;Functional mobility training;Patient/family education    PT Next Visit Plan  Review HEP for independent gym program    PT Home Exercise Plan  see patient education section    Consulted and Agree with Plan of Care  Patient       Patient will benefit from skilled therapeutic intervention in order to improve the following deficits and impairments:  Decreased activity tolerance, Decreased strength, Decreased range of motion, Pain, Postural dysfunction  Visit Diagnosis: Cervicalgia  Abnormal posture     Problem List Patient Active Problem List   Diagnosis Date Noted  . Ankylosing spondylitis of cervicothoracic region ([place]) 10/16/2016  . TIA (transient ischemic attack) 08/10/2015  . Carotid stenosis 08/04/2014  . Hyperlipidemia 02/21/2012  . BENIGN PROSTATIC HYPERTROPHY, WITH OBSTRUCTION 01/15/2010  . Essential hypertension 11/10/2009  . GERD 02/22/2008  . NEPHROLITHIASIS, HX OF 11/17/2007    Gabriela Eves, PT, DPT 12/08/2019, 2:10 PM  [facility] [address] [place], [place], [address] Phone: [phone]   Fax:  [phone]  Name: [name] MRN: [id number] Date of Birth: July 12, 1936

## 2019-12-15 ENCOUNTER — Other Ambulatory Visit: Payer: Self-pay

## 2019-12-15 ENCOUNTER — Ambulatory Visit: Payer: Medicare Other | Admitting: Physical Therapy

## 2019-12-15 ENCOUNTER — Encounter: Payer: Self-pay | Admitting: Physical Therapy

## 2019-12-15 DIAGNOSIS — M542 Cervicalgia: Secondary | ICD-10-CM

## 2019-12-15 DIAGNOSIS — R293 Abnormal posture: Secondary | ICD-10-CM | POA: Diagnosis not present

## 2019-12-15 NOTE — Therapy (Addendum)
[facility] [place], [place], [address] Phone: [phone]   Fax:  [phone]  Physical Therapy Treatment  Patient Details  Name: [name] MRN: [id number] Date of Birth: [birth year]  Referring Provider (PT): Gavin Pound, MD    Encounter Date: 12/15/2019  PT End of Session - 12/15/19 1151    Visit Number  6    Number of Visits  6    Date for PT Re-Evaluation  12/29/19    PT Start Time  1150    PT Stop Time  1230    PT Time Calculation (min)  40 min    Activity Tolerance  Patient tolerated treatment well    Behavior During Therapy  [facility] for tasks assessed/performed       Past Medical History:  Diagnosis Date  . Ankylosing spondylitis ([place]) dx'd ~ 1974  . Arthritis    "back" (08/10/2015)  . BENIGN PROSTATIC HYPERTROPHY, WITH OBSTRUCTION 01/15/2010  . Bleeding duodenal ulcer   . Bleeding esophageal ulcer   . Bleeding stomach ulcer   . GERD 02/22/2008  . History of blood transfusion 1988   "lost ~ 1/2 of my blood volume from multiple bleeding ulcers"  . History of hiatal hernia   . HYPERGLYCEMIA 11/18/2007  . HYPERLIPIDEMIA 02/22/2008  . HYPERTENSION, UNSPECIFIED 11/10/2009  . Kidney stones   . Osteoarthritis    c-spine  . PEPTIC ULCER DISEASE 11/17/2007  . Situational depression    "son died in [place] 06/07/2015"  . TIA (transient ischemic attack)    "not that I know of in the past; they are trying to determine if I've had one today (08/10/2015)"    Past Surgical History:  Procedure Laterality Date  . CATARACT EXTRACTION W/ INTRAOCULAR LENS  IMPLANT, BILATERAL Bilateral 2013    There were no vitals filed for this visit.  Subjective Assessment - 12/15/19 1152    Subjective  COVID-19 screening performed upon arrival. Patient arrives feeling good.    Pertinent History  HTN, Parkinson's disease, ankylosing spondylitis    Patient Stated Goals  return to exercising    Currently in Pain?  No/denies          [facility] PT Assessment - 12/15/19 0001      Assessment   Medical Diagnosis  Ankylosing spondylitis    Referring Provider (PT)  Gavin Pound, MD    Hand Dominance  Right    Next MD Visit  September 2021    Prior Therapy  yes      Precautions   Precautions  [facility] Adult PT Treatment/Exercise - 12/15/19 0001      Exercises   Exercises  Neck;Shoulder      Neck Exercises: Machines for Strengthening   UBE (Upper Arm Bike)  60 RPM x10 mins 5' fwd, 5' bwd    Nustep  level 5 x15 mins UE and LEs      Shoulder Exercises: Seated   Other Seated Exercises  lat pull down blue XTS 2x20      Shoulder Exercises: Pulleys   Flexion  3 minutes      Shoulder Exercises: ROM/Strengthening   Cybex Press  2 plate;25 reps    Cybex Row  2 plate;25 reps                  PT Long Term Goals - 12/08/19 1059  PT LONG TERM GOAL #1   Title  Patient will be independent with HEP    Time  6    Period  Weeks    Status  Achieved      PT LONG TERM GOAL #2   Title  Patient will demonstrate 50 degrees or greater of bilateral cervical rotation to improve ability to scan environment    Time  6    Period  Weeks    Status  Not Met      PT LONG TERM GOAL #3   Title  Patient will report ability to perform ADLs with cervical pain less than or equal to 3/10    Time  6    Period  Weeks    Status  Partially Met            Plan - 12/15/19 1228    Clinical Impression Statement  Patient was able to tolerate treatment well with just reports of fatigue. Patient noted with improved form and only need one verbal cue for form and technique. Patient and PT discussed HEP and gym program and the best way to progress program. Patient reported understanding. Patient to be discharged    Personal Factors and Comorbidities  Age;Comorbidity 3+;Time since onset of injury/illness/exacerbation    Comorbidities  HTN, ankylosing spondylitis, Parkinson's Disease     Examination-Activity Limitations  Lift    Examination-Participation Restrictions  Driving    Stability/Clinical Decision Making  Stable/Uncomplicated    Clinical Decision Making  Low    Rehab Potential  Poor    PT Frequency  1x / week    PT Duration  6 weeks    PT Treatment/Interventions  ADLs/Self Care Home Management;Electrical Stimulation;Moist Heat;Therapeutic exercise;Neuromuscular re-education;Passive range of motion;Therapeutic activities;Functional mobility training;Patient/family education    PT Next Visit Plan  DC    PT Home Exercise Plan  see patient education section    Consulted and Agree with Plan of Care  Patient       Patient will benefit from skilled therapeutic intervention in order to improve the following deficits and impairments:  Decreased activity tolerance, Decreased strength, Decreased range of motion, Pain, Postural dysfunction  Visit Diagnosis: Cervicalgia  Abnormal posture     Problem List Patient Active Problem List   Diagnosis Date Noted  . Ankylosing spondylitis of cervicothoracic region ([place]) 10/16/2016  . TIA (transient ischemic attack) 08/10/2015  . Carotid stenosis 08/04/2014  . Hyperlipidemia 02/21/2012  . BENIGN PROSTATIC HYPERTROPHY, WITH OBSTRUCTION 01/15/2010  . Essential hypertension 11/10/2009  . GERD 02/22/2008  . NEPHROLITHIASIS, HX OF 11/17/2007    Gabriela Eves, PT, DPT 12/15/2019, 12:47 PM  [facility] [place], [place], [address] Phone: [phone]   Fax:  [phone]  Name: [name] MRN: [id number] Date of Birth: Nov 06, 1935

## 2020-01-06 DIAGNOSIS — M79676 Pain in unspecified toe(s): Secondary | ICD-10-CM | POA: Diagnosis not present

## 2020-01-06 DIAGNOSIS — B351 Tinea unguium: Secondary | ICD-10-CM | POA: Diagnosis not present

## 2020-01-14 DIAGNOSIS — Z961 Presence of intraocular lens: Secondary | ICD-10-CM | POA: Diagnosis not present

## 2020-01-14 DIAGNOSIS — H52203 Unspecified astigmatism, bilateral: Secondary | ICD-10-CM | POA: Diagnosis not present

## 2020-02-07 DIAGNOSIS — L570 Actinic keratosis: Secondary | ICD-10-CM | POA: Diagnosis not present

## 2020-02-07 DIAGNOSIS — Z85828 Personal history of other malignant neoplasm of skin: Secondary | ICD-10-CM | POA: Diagnosis not present

## 2020-02-07 DIAGNOSIS — L905 Scar conditions and fibrosis of skin: Secondary | ICD-10-CM | POA: Diagnosis not present

## 2020-02-07 DIAGNOSIS — L819 Disorder of pigmentation, unspecified: Secondary | ICD-10-CM | POA: Diagnosis not present

## 2020-02-07 DIAGNOSIS — L298 Other pruritus: Secondary | ICD-10-CM | POA: Diagnosis not present

## 2020-03-20 ENCOUNTER — Other Ambulatory Visit: Payer: Self-pay | Admitting: Cardiology

## 2020-03-27 ENCOUNTER — Ambulatory Visit: Payer: Medicare Other | Admitting: Neurology

## 2020-03-30 DIAGNOSIS — M79676 Pain in unspecified toe(s): Secondary | ICD-10-CM | POA: Diagnosis not present

## 2020-03-30 DIAGNOSIS — B351 Tinea unguium: Secondary | ICD-10-CM | POA: Diagnosis not present

## 2020-04-17 ENCOUNTER — Ambulatory Visit (INDEPENDENT_AMBULATORY_CARE_PROVIDER_SITE_OTHER): Payer: Medicare Other | Admitting: Neurology

## 2020-04-17 ENCOUNTER — Encounter: Payer: Self-pay | Admitting: Neurology

## 2020-04-17 ENCOUNTER — Other Ambulatory Visit: Payer: Self-pay

## 2020-04-17 VITALS — BP 122/64 | HR 89 | Ht 67.0 in | Wt 160.0 lb

## 2020-04-17 DIAGNOSIS — R0681 Apnea, not elsewhere classified: Secondary | ICD-10-CM

## 2020-04-17 DIAGNOSIS — G20 Parkinson's disease: Secondary | ICD-10-CM

## 2020-04-17 DIAGNOSIS — Z820 Family history of epilepsy and other diseases of the nervous system: Secondary | ICD-10-CM

## 2020-04-17 DIAGNOSIS — G4752 REM sleep behavior disorder: Secondary | ICD-10-CM

## 2020-04-17 DIAGNOSIS — R0683 Snoring: Secondary | ICD-10-CM | POA: Diagnosis not present

## 2020-04-17 MED ORDER — CARBIDOPA-LEVODOPA 25-100 MG PO TABS: 1.0000 | ORAL_TABLET | Freq: Four times a day (QID) | ORAL | 3 refills | Status: DC

## 2020-04-17 NOTE — Patient Instructions (Signed)
As discussed, I will order a sleep study to evaluate your sleep for obstructive sleep apnea.  If you have obstructive sleep apnea, you will likely benefit from using a CPAP machine.  We can schedule you at your convenience for the month of September or even October if you like.  We need to get insurance approval for it and we will take care of the authorization process for you.  At this juncture, we will try to increase your Sinemet to 1 pill 4 times daily, namely at 6 AM, 10 AM, 2 PM and 6 PM.  You can take the last dose with some flexibility, about an hour before your dinner time.  Please follow-up routinely in 6 months, sooner if needed.  Please talk to your rheumatologist or primary care physician about your eligibility for your third dose for the Covid vaccine.

## 2020-04-17 NOTE — Progress Notes (Signed)
Subjective:    Patient ID: [name] is a 84 y.o. male.  HPI     Interim history:   [name] is an 84 year old right-handed gentleman with an underlying complex medical history of peptic ulcer disease with history of upper GI bleed, TIA, depression, degenerative disc disease in the neck, osteoarthritis, kidney stones, hyperlipidemia, hypertension, history of hiatal hernia, BPH, arthritis, and ankylosing spondylitis, who presents for follow-up consultation of his parkinsonism.  The patient is accompanied by his wife today. I last saw him on 09/27/2019, at which time he reported 2 falls, he had fallen on uneven ground, thankfully no injuries were sustained.  He reported some confusion when he would first wake up, lasting for a minute or 2.  No significant hallucinations.  He felt that Sinemet was beneficial and he was advised to continue with 1 pill 3 times daily.  Today, 04/17/2020: He reports overall doing fairly well, has had some decline in his mobility, has not been able to walk as much, in part, because of the weather being too hot and also because his wife had to be hospitalized recently.  She is feeling better.  He is trying to get more active again.  He is not sure if he is eligible for the third dose of the Covid vaccine.  He is on Cosentyx.  He has not had any recent falls thankfully.  He does have a history of dream enactment behaviors, typically infrequently.  He also snores, his son had sleep apnea and had a CPAP machine.  His wife has noticed some irregularities in his breathing, particularly in the early morning hours, sometimes he sounds like he is choking and sometimes he sounds like he has excess mucus.  He does report feeling congestion and mucus first thing in the morning.  He tolerates the Sinemet.  He has had some increase in his tremor.  He takes the first dose typically around 630, second dose around 11:30 PM last dose around 530.  Bedtime is generally between 1030 and 11.  They  try to eat well, try to avoid fried food.   Previously:   I saw him on 03/24/2019, at which time he reported doing okay with Sinemet, he was taking it 3 times a day.  He had noticed some benefit including improvement in his mobility and balance.  He felt that perhaps his left hand tremor was a little better.     I spoke to him on 12/23/2018 via phone visit, at which time we mutually agreed to start Sinemet with gradual titration.  He had to reschedule his DaTscan secondary to the virus pandemic.  He had his DaTscan in the interim on 01/07/2019 and I reviewed the results: IMPRESSION: Decreased activity within the LEFT and RIGHT posterior striata is a pattern typical of Parkinson's syndrome pathology.   We called him with his test results.       I first met him on 09/16/2018 at the request of his rheumatology PA, at which time he reported a bilateral hand tremor for the past at least one year, left side more noticeable than right. He had also increased difficulty with mobility. He had some findings concerning for parkinsonism. I suggested we proceed with a nuclear medicine DaT scan but this was delayed secondary to the coronavirus pandemic.    09/16/2018: (He) reports bilateral hand tremor, left more than right for the past at least one year with some progression noted. His wife has noted significant impairment in his mobility  this time. He has a left more than right tremor, the left hand is the most significant. He has not noticed any lower extremity tremor. He does have restriction in his shoulder mobility, left more than right. He has no family history of Parkinson's disease, does not know much about his mother's history her family history, she died at 53, when he was 84 years old. His paternal grandmother lived to be above 45 years old. He had a sister who died young at 74 from lupus, his brother has significant arthritis issues at age 61. He injured his right ankle and foot when he was a child and  continues to have restricted movements in his right ankle. He has fallen a few times. He fell one or 2 times in the last 6 months, one time he fell in the bathroom and one time he fell in the closet. He has had intermittent numbness in both hands, particularly the right hand, particularly with certain movements or posture, such as while holding the steering wheel.    I reviewed your office note from 07/10/2018, which you kindly included. He has also had some balance issues. For his arthritis he takes Cosentyx injections monthly. Of note, he had a brain MRI without contrast as well as MRA head without contrast on 08/10/2015 and I reviewed the results: IMPRESSION: MRI HEAD IMPRESSION:   Negative brain MRI with no acute intracranial infarct or other process identified.   MRA HEAD IMPRESSION:   Negative intracranial MRA. No large or proximal arterial branch occlusion. No high-grade or correctable stenosis.   He is retired, lives with his wife, he quit smoking and drinks alcohol about once a week, caffeine in the form of coffee, 2-3 cups per day on average.    His Past Medical History Is Significant For: Past Medical History:  Diagnosis Date  . Ankylosing spondylitis ([place]) dx'd ~ 1974  . Arthritis    "back" (08/10/2015)  . BENIGN PROSTATIC HYPERTROPHY, WITH OBSTRUCTION 01/15/2010  . Bleeding duodenal ulcer   . Bleeding esophageal ulcer   . Bleeding stomach ulcer   . GERD 02/22/2008  . History of blood transfusion 1988   "lost ~ 1/2 of my blood volume from multiple bleeding ulcers"  . History of hiatal hernia   . HYPERGLYCEMIA 11/18/2007  . HYPERLIPIDEMIA 02/22/2008  . HYPERTENSION, UNSPECIFIED 11/10/2009  . Kidney stones   . Osteoarthritis    c-spine  . PEPTIC ULCER DISEASE 11/17/2007  . Situational depression    "son died in [place] June 21, 2015"  . TIA (transient ischemic attack)    "not that I know of in the past; they are trying to determine if I've had one today (08/10/2015)"    His Past  Surgical History Is Significant For: Past Surgical History:  Procedure Laterality Date  . CATARACT EXTRACTION W/ INTRAOCULAR LENS  IMPLANT, BILATERAL Bilateral 2013    His Family History Is Significant For: Family History  Problem Relation Age of Onset  . Appendicitis Mother        complications  . Colon cancer Father   . Diabetes type II Father   . Lupus Sister   . Colon cancer Other        1st degree relative <60    His Social History Is Significant For: Social History   Socioeconomic History  . Marital status: Married    Spouse name: Not on file  . Number of children: Not on file  . Years of education: Not on file  . Highest education  level: Not on file  Occupational History  . Not on file  Tobacco Use  . Smoking status: Former Smoker    Years: 15.00    Types: Cigars, Pipe    Quit date: 08/26/1978    Years since quitting: 41.6  . Smokeless tobacco: Never Used  Substance and Sexual Activity  . Alcohol use: Yes    Comment: 08/10/2015 "glass of wine ~ month or so; occasionally a can of beer"  . Drug use: No  . Sexual activity: Yes  Other Topics Concern  . Not on file  Social History Narrative   Retired and married.    Social Determinants of Health   Financial Resource Strain:   . Difficulty of Paying Living Expenses: Not on file  Food Insecurity:   . Worried About Charity fundraiser in the Last Year: Not on file  . Ran Out of Food in the Last Year: Not on file  Transportation Needs:   . Lack of Transportation (Medical): Not on file  . Lack of Transportation (Non-Medical): Not on file  Physical Activity:   . Days of Exercise per Week: Not on file  . Minutes of Exercise per Session: Not on file  Stress:   . Feeling of Stress : Not on file  Social Connections:   . Frequency of Communication with Friends and Family: Not on file  . Frequency of Social Gatherings with Friends and Family: Not on file  . Attends Religious Services: Not on file  . Active Member  of Clubs or Organizations: Not on file  . Attends Archivist Meetings: Not on file  . Marital Status: Not on file    His Allergies Are:  No Known Allergies:   His Current Medications Are:  Outpatient Encounter Medications as of 04/17/2020  Medication Sig  . aspirin 81 MG tablet Take 81 mg by mouth daily.    Marland Kitchen atorvastatin (LIPITOR) 10 MG tablet TAKE 1 TABLET BY MOUTH  DAILY  . carbidopa-levodopa (SINEMET IR) 25-100 MG tablet Take 1 tablet by mouth 3 (three) times daily.  . Cholecalciferol (VITAMIN D PO) Take 1,000 Units by mouth daily.  Marland Kitchen levothyroxine (SYNTHROID, LEVOTHROID) 25 MCG tablet Take 1 tablet (25 mcg total) by mouth daily before breakfast.  . losartan-hydrochlorothiazide (HYZAAR) 50-12.5 MG tablet TAKE ONE-HALF TABLET BY  MOUTH DAILY  . Multiple Vitamin (MULTIVITAMIN WITH MINERALS) TABS tablet Take 1 tablet by mouth daily.  . nitroGLYCERIN (NITROSTAT) 0.4 MG SL tablet Place 1 tablet (0.4 mg total) under the tongue every 5 (five) minutes as needed.  . pantoprazole (PROTONIX) 20 MG tablet Take 20 mg by mouth daily.  . Secukinumab (COSENTYX ) Inject into the skin.  Marland Kitchen sulindac (CLINORIL) 150 MG tablet Take 150 mg by mouth daily as needed.  . [DISCONTINUED] famotidine (PEPCID) 20 MG tablet Take 20 mg by mouth 2 (two) times daily.  (Patient not taking: Reported on 04/17/2020)   No facility-administered encounter medications on file as of 04/17/2020.  :  Review of Systems:  Out of a complete 14 point review of systems, all are reviewed and negative with the exception of these symptoms as listed below: Review of Systems  Neurological:       Here for 6 month f/u. Pt reports he has been fairly stable since last visit. Denies any fall. He does state his left handed tremor has increased since his last visit and sometimes he feels unsteady but overall he feels like he has been doing well.  Objective:  Neurological Exam  Physical Exam Physical Examination:   Vitals:    04/17/20 1504  BP: 122/64  Pulse: 89  SpO2: 98%    General Examination: The patient is a very pleasant 84 y.o. male in no acute distress. He appears well-developed and well-nourished and well groomed.   HEENT:Normocephalic, atraumatic, pupils are equal, round and reactive to light, extraocular tracking is preserved fairly well. He has no nystagmus, hearing is mildly impaired. He hasmild facial masking. He has moderate nuchal rigidity. He has restricted range of motion in the neck. He hasno obviouslower jaw tremor. Mildhypophonia. There isnosialorrhea.There are no carotid bruits on auscultation. Oropharynx exam reveals:adequatedental hygiene, tongue protrudes centrally and palate elevates symmetrically.   Chest:Clear to auscultation without wheezing, rhonchi or crackles noted.  Heart:S1+S2+0, regular and normal without murmurs, rubs or gallops noted.   Abdomen:Soft, non-tender and non-distended with normal bowel sounds appreciated on auscultation.  Extremities:There isnopitting edema in the distal lower extremities bilaterally.   Skin: Warm and dry without trophic changes noted.  Musculoskeletal: exam revealslower back stiffness,reduced ROM inneck, restricted mobility in the left shoulder, arthritic changes in both hands.Restricted mobility in the right ankle from a childhood injury, all fairly stable.  Neurologically:  Mental status: The patient is awake, alert and oriented in all 4 spheres.Hisimmediate and remote memory, attention, language skills and fund of knowledge are appropriate. There is no evidence of aphasia, agnosia, apraxia or anomia. Speech is clear with normal prosody and enunciation. Thought process is linear. Mood is normaland affect is normal.  Cranial nerves II - XII are as described above under HEENT exam.  Motor exam: Normal bulk, strength andmildly increased tone noted in the left more than right upper extremity.  (On1/22/2020: on  [name] spiral drawing he has moderate difficulty with the left hand, mild difficulty with right hand. Handwriting with his dominant, right hand is somewhat difficult to read, on the smaller side, not particularly tremulous.)  He has amild but intermittent resting tremor in the left hand, stable. He has no other resting tremor. He does have a slight postural tremor in the left more than right hand, no significant action tremor. He has no intention tremor.  Romberg isnot tested due to safety concerns. Fine motor skills and coordination:on finger taps he has moderate difficulty with the left and mildly so with the right hand, rapid alternating patting is moderately impaired on the left and mildly so on the right. Hand movements are mildly impaired on the left and fairly normal on the right. On foot agility and foot taps he has difficulty with the right foot because it has limited mobility in the ankle, left foot shows mild difficulty.  Cerebellar testing: No dysmetria or intention tremor on finger to nose testing. Heel to shin is unremarkable bilaterally. There is no truncal or gait ataxia.  Sensory exam: intact to light touch in the upper and lower extremities.  Gait, station and balance:Hestands with difficulty. No veering to one side is noted. No leaning to one side is noted. Posture ismoderately stooped. He walks without a walking aid, slowly and cautiouslywith decreased arm swing on the left noted. He has a more noticeable tremor in the left hand while walking. Gait and posture stable.  Assessmentand Plan:   In summary,Randen F Mossis a very pleasant 64 year oldmalewith an underlying complex medical history of peptic ulcer disease with history of upper GI bleed, TIA, depression, degenerative disc disease in the neck, osteoarthritis, kidney stones, hyperlipidemia, hypertension, history of hiatal hernia, BPH,  arthritis, including ankylosing spondylitis (followed by rheumatology),  whopresents forfollow-up consultation of his parkinsonism, probable left-sided predominant Parkinson's disease with symptoms dating back a few years, may be as long as 4 years. He has infrequent dream enactment behavior, no major issues in that regard in the recent past but has had some snoring and breathing irregularities.  Given his son's history of sleep apnea, would like to pursue a sleep study at this time.  We talked about obstructive sleep apnea and treatment options.  He is advised that the #1 treatment for sleep apnea, generally speaking is a CPAP or similar machine.  Has been able to tolerate Sinemet 3 times daily.  He has had some decline in his mobility and at times feels that the tremors worse.  I suggested we increase his Sinemet to 1 pill 4 times daily at 6 AM, 10 AM, 2 PM and 6 PM.  He is advised to take his last dose about an hour before his dinnertime with some fluctuation in the schedule if needed.  He is agreeable to this change.  We will call him to schedule his sleep study hopefully soon.  We can schedule that his convenience in the month of September or October even.  We talked about the importance of healthy lifestyle, good nutrition, good hydration and physical activity.  He has had both Covid shots, he may be eligible for a booster shot given his rheumatological medication.  He is advised to check with his rheumatologist or primary care physician.  He is advised to follow-up routinely in 6 months, sooner if needed.  I answered all their questions today and the patient and his wife were in agreement.  I spent 40 minutes in total face-to-face time and in reviewing records during pre-charting, more than 50% of which was spent in counseling and coordination of care, reviewing test results, reviewing medications and treatment regimen and/or in discussing or reviewing the diagnosis of PD, snoring, sleep apnea, the prognosis and treatment options. Pertinent laboratory and imaging test results  that were available during this visit with the patient were reviewed by me and considered in my medical decision making (see chart for details).

## 2020-04-20 ENCOUNTER — Telehealth: Payer: Self-pay

## 2020-04-20 NOTE — Telephone Encounter (Signed)
LVM for pt to call me back to schedule sleep study  

## 2020-04-26 ENCOUNTER — Other Ambulatory Visit: Payer: Self-pay

## 2020-04-26 ENCOUNTER — Ambulatory Visit (INDEPENDENT_AMBULATORY_CARE_PROVIDER_SITE_OTHER): Payer: Medicare Other | Admitting: Neurology

## 2020-04-26 DIAGNOSIS — G20 Parkinson's disease: Secondary | ICD-10-CM

## 2020-04-26 DIAGNOSIS — G4761 Periodic limb movement disorder: Secondary | ICD-10-CM

## 2020-04-26 DIAGNOSIS — R0681 Apnea, not elsewhere classified: Secondary | ICD-10-CM

## 2020-04-26 DIAGNOSIS — R0683 Snoring: Secondary | ICD-10-CM

## 2020-04-26 DIAGNOSIS — G4720 Circadian rhythm sleep disorder, unspecified type: Secondary | ICD-10-CM

## 2020-04-26 DIAGNOSIS — G4752 REM sleep behavior disorder: Secondary | ICD-10-CM

## 2020-04-26 DIAGNOSIS — G4733 Obstructive sleep apnea (adult) (pediatric): Secondary | ICD-10-CM | POA: Diagnosis not present

## 2020-04-26 DIAGNOSIS — Z820 Family history of epilepsy and other diseases of the nervous system: Secondary | ICD-10-CM

## 2020-05-04 NOTE — Addendum Note (Signed)
Addended by: Star Age on: 05/04/2020 06:35 PM   Modules accepted: Orders

## 2020-05-04 NOTE — Progress Notes (Signed)
Patient last seen for his parkinsonism on 04/17/20 and reported sleep disturbance, including snoring and FHx of OSA. He had a PSG on 04/26/20.   Please call and notify the patient that the recent sleep study showed evidence of moderate obstructive sleep apnea.  I would like to recommend we start him on treatment at home in the form of an AutoPap machine.  I have placed an order in the chart.  Overall, he did not have very good sleep, had a lot of interruptions, achieved very little dream sleep, had some leg twitching at night.  Given his medical history, his sleep-related complaints and evidence of moderate obstructive sleep apnea would recommend treatment.  To that end I recommend treatment for this in the form of autoPAP, which means, that we don't have to bring him back for a second sleep study with CPAP, but will let him try an autoPAP machine at home, through a DME company (of his choice, or as per insurance requirement). The DME representative will educate him on how to use the machine, how to put the mask on, etc. I have placed an order in the chart. Please send referral, talk to patient, send report to referring MD. We will need a FU in sleep clinic for 10 weeks post-PAP set up, please arrange that with me or one of our NPs. Thanks,   Star Age, MD, PhD [facility])

## 2020-05-04 NOTE — Procedures (Signed)
PATIENT'S NAME:  [name], [name] DOB:      05/01/1936      MR#:    [id number]     DATE OF RECORDING: 04/26/2020 REFERRING M.D.:  Dr. Sheran Fava Performed:   Baseline Polysomnogram HISTORY: 84 year old man with a history of peptic ulcer disease with history of upper GI bleed, TIA, depression, degenerative disc disease in the neck, osteoarthritis, kidney stones, hyperlipidemia, hypertension, history of hiatal hernia, BPH, arthritis, and ankylosing spondylitis, and parkinsonism, who reports snoring and dream enactment behaviors, some irregularities in his breathing. The patient's weight 170 pounds with a height of 67 (inches), resulting in a BMI of 26.6 kg/m2.   CURRENT MEDICATIONS: Aspirin, Lipitor, Sinemet, Vitamin D, Synthroid, Hyzaar, Multivitamins, Nitrostat, Protonix, Cosentyx, Clinoril   PROCEDURE:  This is a multichannel digital polysomnogram utilizing the Somnostar 11.2 system.  Electrodes and sensors were applied and monitored per AASM Specifications.   EEG, EOG, Chin and Limb EMG, were sampled at 200 Hz.  ECG, Snore and Nasal Pressure, Thermal Airflow, Respiratory Effort, CPAP Flow and Pressure, Oximetry was sampled at 50 Hz. Digital video and audio were recorded.      BASELINE STUDY  Lights Out was at 21:43 and Lights On at 05:03.  Total recording time (TRT) was 440.5 minutes, with a total sleep time (TST) of 282 minutes.   The patient's sleep latency was 43.5 minutes, which is delayed. REM latency was 209.5 minutes, which is delayed. The sleep efficiency was 64%, which is reduced.     SLEEP ARCHITECTURE: WASO (Wake after sleep onset) was 114 minutes with moderate to severe sleep fragmentation noted. There were 31.5 minutes in Stage N1, 200.5 minutes Stage N2, 32.5 minutes Stage N3 and 17.5 minutes in Stage REM.  The percentage of Stage N1 was 11.2%, which is increased, Stage N2 was 71.1%, which is increased, Stage N3 was 11.5% and Stage R (REM sleep) was 6.2%, which is markedly reduced. The  arousals were noted as: 38 were spontaneous, 0 were associated with PLMs, 60 were associated with respiratory events.  RESPIRATORY ANALYSIS:  There were a total of 86 respiratory events:  82 obstructive apneas, 0 central apneas and 1 mixed apneas with a total of 83 apneas and an apnea index (AI) of 17.7 /hour. There were 3 hypopneas with a hypopnea index of .6 /hour. The patient also had 0 respiratory event related arousals (RERAs).      The total APNEA/HYPOPNEA INDEX (AHI) was 18.3/hour and the total RESPIRATORY DISTURBANCE INDEX was  18.3 /hour.  2 events occurred in REM sleep and 7 events in NREM. The REM AHI was  6.9 /hour, versus a non-REM AHI of 19.1. The patient spent 257.5 minutes of total sleep time in the supine position and 25 minutes in non-supine.. The supine AHI was 19.8 versus a non-supine AHI of 2.4.  OXYGEN SATURATION & C02:  The Wake baseline 02 saturation was 98%, with the lowest being 87%. Time spent below 89% saturation equaled 1 minutes.  PERIODIC LIMB MOVEMENTS: The patient had a total of 87 Periodic Limb Movements.  The Periodic Limb Movement (PLM) index was 18.5 and the PLM Arousal index was 0/hour.  Audio and video analysis did not show any abnormal or unusual movements, behaviors, phonations or vocalizations. No obvious changed in keeping with REM behavior disorder were seen. The patient took 2 bathroom breaks. Mild snoring was noted. The EKG was in keeping with normal sinus rhythm (NSR).  Post-study, the patient indicated that sleep was the same as usual.  IMPRESSION: 1. Obstructive Sleep Apnea (OSA) 2. Periodic Limb Movement Disorder (PLMD) 3. Dysfunctions associated with sleep stages or arousal from sleep  RECOMMENDATIONS: 1. This study demonstrates moderate obstructive sleep apnea, with a total AHI of 18.3/hour, and O2 nadir of 87%. Treatment with positive airway pressure is recommended and can be attempted with autoPAP therapy for now. A full night titration study  can be considered to optimize therapy down the road, if needed. Other treatment options - generally speaking - may include avoidance of supine sleep position along with weight loss, upper airway or jaw surgery in selected patients or the use of an oral appliance in certain patients. ENT evaluation and/or consultation with a maxillofacial surgeon or dentist may be feasible in some instances.    2. Please note that untreated obstructive sleep apnea may carry additional perioperative morbidity. Patients with significant obstructive sleep apnea should receive perioperative PAP therapy and the surgeons and particularly the anesthesiologist should be informed of the diagnosis and the severity of the sleep disordered breathing. 3. Mild PLMs (periodic limb movements of sleep) were noted during this study with no significant arousals; clinical correlation is recommended. PLMs may improve with OSA treatment.  4. This study shows sleep fragmentation and abnormal sleep stage percentages; these are nonspecific findings and per se do not signify an intrinsic sleep disorder or a cause for the patient's sleep-related symptoms. Causes include (but are not limited to) the first night effect of the sleep study, circadian rhythm disturbances, medication effect or an underlying mood disorder or medical problem.  5. The patient should be cautioned not to drive, work at heights, or operate dangerous or heavy equipment when tired or sleepy. Review and reiteration of good sleep hygiene measures should be pursued with any patient. 6. The patient will be seen in follow-up in the sleep clinic at [facility] for discussion of the test results, symptom and treatment compliance review, further management strategies, etc. The referring provider will be notified of the test results.  I certify that I have reviewed the entire raw data recording prior to the issuance of this report in accordance with the Standards of Accreditation of the American  Academy of Sleep Medicine (AASM)  Star Age, MD, PhD Diplomat, American Board of Neurology and Sleep Medicine (Neurology and Sleep Medicine)

## 2020-05-09 ENCOUNTER — Telehealth: Payer: Self-pay

## 2020-05-09 DIAGNOSIS — L298 Other pruritus: Secondary | ICD-10-CM | POA: Diagnosis not present

## 2020-05-09 DIAGNOSIS — L304 Erythema intertrigo: Secondary | ICD-10-CM | POA: Diagnosis not present

## 2020-05-09 DIAGNOSIS — L570 Actinic keratosis: Secondary | ICD-10-CM | POA: Diagnosis not present

## 2020-05-09 DIAGNOSIS — Z85828 Personal history of other malignant neoplasm of skin: Secondary | ICD-10-CM | POA: Diagnosis not present

## 2020-05-09 DIAGNOSIS — L905 Scar conditions and fibrosis of skin: Secondary | ICD-10-CM | POA: Diagnosis not present

## 2020-05-09 NOTE — Telephone Encounter (Signed)
I called the pt back and we discussed the results. Pt has several questions he would like to address directly with the MD before agreeing to start the autopap.   Pt as been scheduled for 05/10/2020 at 1 pm with Dr. Rexene Alberts to review.

## 2020-05-09 NOTE — Telephone Encounter (Signed)
Pt is returning call from this morning with Verlin Grills, RN.

## 2020-05-09 NOTE — Telephone Encounter (Signed)
-----   Message from Star Age, MD sent at 05/04/2020  6:35 PM EDT ----- Patient last seen for his parkinsonism on 04/17/20 and reported sleep disturbance, including snoring and FHx of OSA. He had a PSG on 04/26/20.   Please call and notify the patient that the recent sleep study showed evidence of moderate obstructive sleep apnea.  I would like to recommend we start him on treatment at home in the form of an AutoPap machine.  I have placed an order in the chart.  Overall, he did not have very good sleep, had a lot of interruptions, achieved very little dream sleep, had some leg twitching at night.  Given his medical history, his sleep-related complaints and evidence of moderate obstructive sleep apnea would recommend treatment.  To that end I recommend treatment for this in the form of autoPAP, which means, that we don't have to bring him back for a second sleep study with CPAP, but will let him try an autoPAP machine at home, through a DME company (of his choice, or as per insurance requirement). The DME representative will educate him on how to use the machine, how to put the mask on, etc. I have placed an order in the chart. Please send referral, talk to patient, send report to referring MD. We will need a FU in sleep clinic for 10 weeks post-PAP set up, please arrange that with me or one of our NPs. Thanks,   Star Age, MD, PhD [facility])

## 2020-05-09 NOTE — Telephone Encounter (Signed)
See separate telephone note from 05/09/2020.

## 2020-05-09 NOTE — Telephone Encounter (Signed)
I called the pt and spoke with the pt and his wife. I attempted to go over the sleep studies with both parties and was informed at first the pt would like to have a f/u to discuss results directly with Dr. Rexene Alberts. I advised I could help set this up. Pt then stated he had another doctors appt to get to and would need to call back.  Pt was advised he could call back and ask to speak with the schedulers if he wanted a f/u with the MD or he could call back and ask to speak with me and I would be happy to review his results and the Dr's recommendation. Pt stated he would have to get back with [place] after his appointment this morning.

## 2020-05-10 ENCOUNTER — Ambulatory Visit (INDEPENDENT_AMBULATORY_CARE_PROVIDER_SITE_OTHER): Payer: Medicare Other | Admitting: Neurology

## 2020-05-10 ENCOUNTER — Encounter: Payer: Self-pay | Admitting: Neurology

## 2020-05-10 ENCOUNTER — Other Ambulatory Visit: Payer: Self-pay

## 2020-05-10 VITALS — BP 128/72 | HR 69 | Ht 67.0 in | Wt 159.0 lb

## 2020-05-10 DIAGNOSIS — M2550 Pain in unspecified joint: Secondary | ICD-10-CM | POA: Diagnosis not present

## 2020-05-10 DIAGNOSIS — G4733 Obstructive sleep apnea (adult) (pediatric): Secondary | ICD-10-CM | POA: Diagnosis not present

## 2020-05-10 DIAGNOSIS — E663 Overweight: Secondary | ICD-10-CM | POA: Diagnosis not present

## 2020-05-10 DIAGNOSIS — M150 Primary generalized (osteo)arthritis: Secondary | ICD-10-CM | POA: Diagnosis not present

## 2020-05-10 DIAGNOSIS — Z6825 Body mass index (BMI) 25.0-25.9, adult: Secondary | ICD-10-CM | POA: Diagnosis not present

## 2020-05-10 DIAGNOSIS — G20 Parkinson's disease: Secondary | ICD-10-CM

## 2020-05-10 DIAGNOSIS — M459 Ankylosing spondylitis of unspecified sites in spine: Secondary | ICD-10-CM | POA: Diagnosis not present

## 2020-05-10 DIAGNOSIS — Z79899 Other long term (current) drug therapy: Secondary | ICD-10-CM | POA: Diagnosis not present

## 2020-05-10 NOTE — Patient Instructions (Signed)
We will set you up at home with a so called autoPAP machine for treatment of your obstructive sleep apnea.  As discussed, you will need a follow up appointment in about 2 to 3 months to see how you are doing with the machine and how you are numbers are looking, we will pull a so-called compliance download at the time.  Please schedule with the nurse practitioner on your way out.

## 2020-05-10 NOTE — Progress Notes (Signed)
Subjective:    Patient ID: [name] is a 84 y.o. male.  HPI     Interim history:   [name] is an 84 year old right-handed gentleman with an underlying complex medical history of peptic ulcer disease with history of upper GI bleed, TIA, depression, degenerative disc disease in the neck, osteoarthritis, kidney stones, hyperlipidemia, hypertension, history of hiatal hernia, BPH, arthritis, and ankylosing spondylitis, and parkinsonism, who presents for follow-up of his sleep disturbance after recent sleep testing.  The patient is accompanied by his wife today.  I last saw him about 3 weeks ago on 04/17/2020, at which time he reported some decline in his mobility.  His wife reported that he had some irregularities in his breathing while asleep, also some snoring, he reported congestion and feeling of excess mucus.  He was advised to proceed with a sleep study.  He had a baseline sleep study on 04/26/2020 and we reviewed test results today.  Sleep efficiency was reduced at 64%, sleep latency 43.5 minutes, REM latency 209.5 minutes.  He had a significantly reduced percentage of REM sleep at 6.2, he had moderate to severe sleep fragmentation, wake after sleep onset was 140 minutes.  Total AHI was 18.3/h.  Supine AHI was 19.8/h.  Average oxygen saturation was 98%, nadir was 87%.  He had mild PLM's no significant arousals.  He was advised to proceed with treatment at home in the form of AutoPap.  He requested a follow-up appointment to discuss.  Today, 05/10/2020: He reports no new symptoms.  He has several questions regarding his sleep study.  I answered all his questions to the best of my abilities.  He did report difficulty falling asleep at the time of the sleep study due to noise.  He was wondering what the noise from was from.  I explained to him that it could be 1 of several issues including noise from other patients, noise from the computer station, also noise from the road.  I reassured him that we try  to keep the sleep lab as quiet as possible.  He has not had any telltale symptoms of restless legs, very rare instances of yelling out while asleep.  His snoring per wife's report can fluctuate from very quiet now.  Sometimes she cannot sleep in the same bed despite using ear plugs.  Previously:   I saw him on 03/24/2019, at which time he reported doing okay with Sinemet, he was taking it 3 times a day.  He had noticed some benefit including improvement in his mobility and balance.  He felt that perhaps his left hand tremor was a little better.     I spoke to him on 12/23/2018 via phone visit, at which time we mutually agreed to start Sinemet with gradual titration.  He had to reschedule his DaTscan secondary to the virus pandemic.  He had his DaTscan in the interim on 01/07/2019 and I reviewed the results: IMPRESSION: Decreased activity within the LEFT and RIGHT posterior striata is a pattern typical of Parkinson's syndrome pathology.   We called him with his test results.       I first met him on 09/16/2018 at the request of his rheumatology PA, at which time he reported a bilateral hand tremor for the past at least one year, left side more noticeable than right. He had also increased difficulty with mobility. He had some findings concerning for parkinsonism. I suggested we proceed with a nuclear medicine DaT scan but this was delayed secondary  to the coronavirus pandemic.    09/16/2018: (He) reports bilateral hand tremor, left more than right for the past at least one year with some progression noted. His wife has noted significant impairment in his mobility this time. He has a left more than right tremor, the left hand is the most significant. He has not noticed any lower extremity tremor. He does have restriction in his shoulder mobility, left more than right. He has no family history of Parkinson's disease, does not know much about his mother's history her family history, she died at 86, when he  was 84 years old. His paternal grandmother lived to be above 68 years old. He had a sister who died young at 53 from lupus, his brother has significant arthritis issues at age 33. He injured his right ankle and foot when he was a child and continues to have restricted movements in his right ankle. He has fallen a few times. He fell one or 2 times in the last 6 months, one time he fell in the bathroom and one time he fell in the closet. He has had intermittent numbness in both hands, particularly the right hand, particularly with certain movements or posture, such as while holding the steering wheel.    I reviewed your office note from 07/10/2018, which you kindly included. He has also had some balance issues. For his arthritis he takes Cosentyx injections monthly. Of note, he had a brain MRI without contrast as well as MRA head without contrast on 08/10/2015 and I reviewed the results: IMPRESSION: MRI HEAD IMPRESSION:   Negative brain MRI with no acute intracranial infarct or other process identified.   MRA HEAD IMPRESSION:   Negative intracranial MRA. No large or proximal arterial branch occlusion. No high-grade or correctable stenosis.   He is retired, lives with his wife, he quit smoking and drinks alcohol about once a week, caffeine in the form of coffee, 2-3 cups per day on average.   His Past Medical History Is Significant For: Past Medical History:  Diagnosis Date  . Ankylosing spondylitis ([place]) dx'd ~ 1974  . Arthritis    "back" (08/10/2015)  . BENIGN PROSTATIC HYPERTROPHY, WITH OBSTRUCTION 01/15/2010  . Bleeding duodenal ulcer   . Bleeding esophageal ulcer   . Bleeding stomach ulcer   . GERD 02/22/2008  . History of blood transfusion 1988   "lost ~ 1/2 of my blood volume from multiple bleeding ulcers"  . History of hiatal hernia   . HYPERGLYCEMIA 11/18/2007  . HYPERLIPIDEMIA 02/22/2008  . HYPERTENSION, UNSPECIFIED 11/10/2009  . Kidney stones   . Osteoarthritis    c-spine  .  PEPTIC ULCER DISEASE 11/17/2007  . Situational depression    "son died in [place] 06-28-15"  . TIA (transient ischemic attack)    "not that I know of in the past; they are trying to determine if I've had one today (08/10/2015)"    His Past Surgical History Is Significant For: Past Surgical History:  Procedure Laterality Date  . CATARACT EXTRACTION W/ INTRAOCULAR LENS  IMPLANT, BILATERAL Bilateral 2013    His Family History Is Significant For: Family History  Problem Relation Age of Onset  . Appendicitis Mother        complications  . Colon cancer Father   . Diabetes type II Father   . Lupus Sister   . Colon cancer Other        1st degree relative <60    His Social History Is Significant For: Social History  Socioeconomic History  . Marital status: Married    Spouse name: Not on file  . Number of children: Not on file  . Years of education: Not on file  . Highest education level: Not on file  Occupational History  . Not on file  Tobacco Use  . Smoking status: Former Smoker    Years: 15.00    Types: Cigars, Pipe    Quit date: 08/26/1978    Years since quitting: 41.7  . Smokeless tobacco: Never Used  Substance and Sexual Activity  . Alcohol use: Yes    Comment: 08/10/2015 "glass of wine ~ month or so; occasionally a can of beer"  . Drug use: No  . Sexual activity: Yes  Other Topics Concern  . Not on file  Social History Narrative   Retired and married.    Social Determinants of Health   Financial Resource Strain:   . Difficulty of Paying Living Expenses: Not on file  Food Insecurity:   . Worried About Charity fundraiser in the Last Year: Not on file  . Ran Out of Food in the Last Year: Not on file  Transportation Needs:   . Lack of Transportation (Medical): Not on file  . Lack of Transportation (Non-Medical): Not on file  Physical Activity:   . Days of Exercise per Week: Not on file  . Minutes of Exercise per Session: Not on file  Stress:   . Feeling of  Stress : Not on file  Social Connections:   . Frequency of Communication with Friends and Family: Not on file  . Frequency of Social Gatherings with Friends and Family: Not on file  . Attends Religious Services: Not on file  . Active Member of Clubs or Organizations: Not on file  . Attends Archivist Meetings: Not on file  . Marital Status: Not on file    His Allergies Are:  No Known Allergies:   His Current Medications Are:  Outpatient Encounter Medications as of 05/10/2020  Medication Sig  . aspirin 81 MG tablet Take 81 mg by mouth daily.    Marland Kitchen atorvastatin (LIPITOR) 10 MG tablet TAKE 1 TABLET BY MOUTH  DAILY  . carbidopa-levodopa (SINEMET IR) 25-100 MG tablet Take 1 tablet by mouth 4 (four) times daily.  . Cholecalciferol (VITAMIN D PO) Take 1,000 Units by mouth daily.  Marland Kitchen levothyroxine (SYNTHROID, LEVOTHROID) 25 MCG tablet Take 1 tablet (25 mcg total) by mouth daily before breakfast.  . losartan-hydrochlorothiazide (HYZAAR) 50-12.5 MG tablet TAKE ONE-HALF TABLET BY  MOUTH DAILY  . Multiple Vitamin (MULTIVITAMIN WITH MINERALS) TABS tablet Take 1 tablet by mouth daily.  . nitroGLYCERIN (NITROSTAT) 0.4 MG SL tablet Place 1 tablet (0.4 mg total) under the tongue every 5 (five) minutes as needed.  . pantoprazole (PROTONIX) 20 MG tablet Take 20 mg by mouth daily.  . Secukinumab (COSENTYX [place]) Inject into the skin.  Marland Kitchen sulindac (CLINORIL) 150 MG tablet Take 150 mg by mouth daily as needed.   No facility-administered encounter medications on file as of 05/10/2020.  :  Review of Systems:  Out of a complete 14 point review of systems, all are reviewed and negative with the exception of these symptoms as listed below: Review of Systems  Neurological:       Here to f/u on sleep study pt reports he has several questions for the MD before starting autopap therapy.     Objective:  Neurological Exam  Physical Exam Physical Examination:   Vitals:  05/10/20 1257  BP: 128/72   Pulse: 69    General Examination: The patient is a very pleasant 84 y.o. male in no acute distress. He appears well-developed and well-nourished and well groomed.   HEENT:Normocephalic, atraumatic, pupils are equal, round and reactive to light, extraocular tracking is preserved fairly well. He has no nystagmus, hearing is mildly impaired. He hasmild facial masking. Mildhypophonia. There isnosialorrhea.There are no carotid bruits on auscultation. Oropharynx exam reveals:adequatedental hygiene, tongue protrudes centrally and palate elevates symmetrically.  Small airway entry, overall mild airway crowding.  Chest:Clear to auscultation without wheezing, rhonchi or crackles noted.  Heart:S1+S2+0, regular and normal without murmurs, rubs or gallops noted.   Abdomen:Soft, non-tender and non-distended with normal bowel sounds appreciated on auscultation.  Extremities:There isnopitting edema in the distal lower extremities bilaterally.   Skin: Warm and dry without trophic changes noted.  Musculoskeletal: exam revealslower back stiffness,reduced ROM inneck, restricted mobility in the left shoulder, arthritic changes in both hands.Restricted mobility in the right ankle from a childhood injury, all fairly stable.  Neurologically:  Mental status: The patient is awake, alert and oriented in all 4 spheres.Hisimmediate and remote memory, attention, language skills and fund of knowledge are appropriate. There is no evidence of aphasia, agnosia, apraxia or anomia.  Cranial nerves II - XII are as described above under HEENT exam.  Motor exam: no focal atrophy.  (On1/22/2020: on Archimedes spiral drawing he has moderate difficulty with the left hand, mild difficulty with right hand. Handwriting with his dominant, right hand is somewhat difficult to read, on the smaller side, not particularly tremulous.)  He has amild but intermittent resting tremor in the left hand, stable. He  has no other resting tremor.   Cerebellar testing: No dysmetria or intention tremor.  Sensory exam: intact to light touch.  Gait, station and balance:Hestands with difficulty. No veering to one side is noted. No leaning to one side is noted. Posture ismoderately stooped. He walks without a walking aid, slowly and cautiously.  Assessmentand Plan:   In summary,Makar F Mossis a very pleasant 39 year oldmalewith an underlying complex medical history of peptic ulcer disease with history of upper GI bleed, TIA, depression, degenerative disc disease in the neck, osteoarthritis, kidney stones, hyperlipidemia, hypertension, history of hiatal hernia, BPH, arthritis, including ankylosing spondylitis(followed byrheumatology), and parkinsonism, with complete left-sided findings, who presents for follow-up consultation of his sleep disturbance, after a diagnostic sleep study on 04/26/2020.  His sleep study showed moderate obstructive sleep apnea by a number of events with an AHI of 18.3/h, but milder desaturations, O2 nadir of 87%.  We talked about treatment options.  His sleep study also showed mild.  Neck leg movements without significant arousals, no telltale evidence of REM behavior disorder.  He is willing to proceed with AutoPap therapy.  We can also consider bringing him in for second sleep study in the future if the need arises to optimize treatment with CPAP.  For now, he is agreeable to proceeding with AutoPap at home, explained this option to them and the difference between AutoPap with CPAP.  He is advised to follow-up in the sleep clinic in about 3 months to see the nurse practitioner, this is to ensure that he is doing well with AutoPap therapy and that he has improved symptoms and that his apnea scores and compliance scores look good.  I answered all the questions today and the patient and his wife were in agreement.   I spent 30 minutes in total face-to-face time and in  reviewing records  during pre-charting, more than 50% of which was spent in counseling and coordination of care, reviewing test results, reviewing medications and treatment regimen and/or in discussing or reviewing the diagnosis of OSA, the prognosis and treatment options. Pertinent laboratory and imaging test results that were available during this visit with the patient were reviewed by me and considered in my medical decision making (see chart for details).

## 2020-05-11 ENCOUNTER — Telehealth: Payer: Self-pay | Admitting: Neurology

## 2020-05-11 NOTE — Telephone Encounter (Signed)
Pt called concerned appointment scheduled  by front desk, yesterday maybe out of timeframe for Medicare or [place] to cover auto pap equipment. Would like a call from the nurse.

## 2020-05-11 NOTE — Telephone Encounter (Signed)
I called the pt and advised the 08/23/2020 appt should be appropriate to keep. Pt was advised once he received his start date from the DME we would need to see him 31-90 days after starting his machine. Pt verbalized understanding and will keep appt as scheduled.  Pt was advised we could always move his appt up or back if need be.

## 2020-05-24 ENCOUNTER — Other Ambulatory Visit: Payer: Self-pay | Admitting: Cardiology

## 2020-05-25 ENCOUNTER — Encounter: Payer: Self-pay | Admitting: Physical Therapy

## 2020-05-25 ENCOUNTER — Ambulatory Visit: Payer: Medicare Other | Attending: Physician Assistant | Admitting: Physical Therapy

## 2020-05-25 ENCOUNTER — Other Ambulatory Visit: Payer: Self-pay

## 2020-05-25 DIAGNOSIS — R293 Abnormal posture: Secondary | ICD-10-CM | POA: Insufficient documentation

## 2020-05-25 DIAGNOSIS — M542 Cervicalgia: Secondary | ICD-10-CM | POA: Diagnosis not present

## 2020-05-25 NOTE — Patient Instructions (Signed)
[facility]  DRY NEEDLING CONSENT FORM   Trigger point dry needling is a physical therapy approach to treat Myofascial Pain and Dysfunction.  Dry Needling (DN) is a valuable and effective way to deactivate myofascial trigger points (muscle knots/pain). It is skilled intervention that uses a thin filiform needle to penetrate the skin and stimulate underlying myofascial trigger points, muscular, and connective tissues for the management of neuromusculoskeletal pain and movement impairments.  A local twitch response (LTR) will be elicited.  This can sometimes feel like a deep ache in the muscle during the procedure. Multiple trigger points in multiple muscles can be treated during each treatment.  No medication of any kind is injected.   As with any medical treatment and procedure, there are possible adverse events.  While significant adverse events are uncommon, they do sometimes occur and must be considered prior to giving consent.  1. Dry needling often causes a "post needling soreness".  There can be an increase in pain from a couple of hours to 2-3 days, followed by an improvement in the overall pain state. 2. Any time a needle is used there is a risk of infection.  However, we are using new, sterile, and disposable needles; infections are extremely rare. 3. There is a possibility that you may bleed or bruise.  You may feel tired and some nausea following treatment. 4. There is a rare possibility of a pneumothorax (air in the chest cavity). 5. Allergic reaction to nickel in the stainless steel needle. 6. If a nerve is touched, it may cause paresthesia (a prickling/shock sensation) which is usually brief, but may continue for a couple of days.  Following treatment stay hydrated.  Continue regular activities but not too vigorous initially after treatment for 24-48 hours.  Dry Needling is best when combined with other physical therapy interventions such as  strengthening, stretching and other therapeutic modalities.   PLEASE ANSWER THE FOLLOWING QUESTIONS:  Do you have a lack of sensation?   Y/N  Do you have a phobia or fear of needles  Y/N  Are you pregnant?    Y/N If yes:  How many weeks? __________ Do you have any implanted devices?  Y/N If yes:  Pacemaker/Spinal Cord Stimulator/Deep Brain Stimulator/Insulin Pump/Other: ________________ Do you have any implants?  Y/N If yes: Breast/Facial/Pecs/Buttocks/Calves/Hip  Replacement/ Knee Replacement/Other: _________ Do you take any blood thinners?   Y/N If yes: Coumadin (Warfarin)/Other: ___________________ Do you have a bleeding disorder?   Y/N If yes: What kind: _________________________________ Do you take any immunosuppressants?  Y/N If yes:   What kind: _________________________________ Do you take anti-inflammatories?   Y/N If yes: What kind: Advil/Aspirin/Other: ________________ Have you ever been diagnosed with Scoliosis? Y/N Have you had back surgery?   Y/N If yes:  Laminectomy/Fusion/Other: ___________________   I have read, or had read to me, the above.  I have had the opportunity to ask any questions.  All of my questions have been answered to my satisfaction and I understand the risks involved with dry needling.  I consent to [facility] and treatment [facility], including dry needling, of any and all of my involved and affected muscles.  

## 2020-05-25 NOTE — Therapy (Signed)
[facility] [place], [place], [address] Phone: [phone]   Fax:  [phone]  Physical Therapy Evaluation  Patient Details  Name: [name] MRN: [id number] Date of Birth: [birth year] Referring Provider (PT): Rosita Kea PA-C.   Encounter Date: 05/25/2020   PT End of Session - 05/25/20 1016    Visit Number 1    Number of Visits 16    Date for PT Re-Evaluation 08/23/20    PT Start Time 0900    PT Stop Time 1000    PT Time Calculation (min) 60 min           Past Medical History:  Diagnosis Date  . Ankylosing spondylitis ([place]) dx'd ~ 1974  . Arthritis    "back" (08/10/2015)  . BENIGN PROSTATIC HYPERTROPHY, WITH OBSTRUCTION 01/15/2010  . Bleeding duodenal ulcer   . Bleeding esophageal ulcer   . Bleeding stomach ulcer   . GERD 02/22/2008  . History of blood transfusion 1988   "lost ~ 1/2 of my blood volume from multiple bleeding ulcers"  . History of hiatal hernia   . HYPERGLYCEMIA 11/18/2007  . HYPERLIPIDEMIA 02/22/2008  . HYPERTENSION, UNSPECIFIED 11/10/2009  . Kidney stones   . Osteoarthritis    c-spine  . PEPTIC ULCER DISEASE 11/17/2007  . Situational depression    "son died in [place] 06/26/15"  . TIA (transient ischemic attack)    "not that I know of in the past; they are trying to determine if I've had one today (08/10/2015)"    Past Surgical History:  Procedure Laterality Date  . CATARACT EXTRACTION W/ INTRAOCULAR LENS  IMPLANT, BILATERAL Bilateral 2013    There were no vitals filed for this visit.    Subjective Assessment - 05/25/20 1019    Subjective COVID-19 screen performed prior to patient entering clinic.  The patient presents to physical therapy today with c/o upper back and neck pain and a desire to improve his general mobility.  He states that since last seen he has been diagnosed with Parkinsonism.  His neck/upper pain-level is rated at a 6/10 today.  Exercise makes him feel better.     Pertinent History OA, HTN, Right ankle injury.    How long can you walk comfortably? Been walking about a mile recently.    Patient Stated Goals Decrease pain and move better.    Currently in Pain? Yes    Pain Score 6     Pain Location Neck    Pain Orientation Right;Left    Pain Descriptors / Indicators Sore    Pain Type Chronic pain    Pain Onset More than a month ago    Pain Frequency Constant              [facility] PT Assessment - 05/25/20 0001      Assessment   Medical Diagnosis Ankylosing Spondylitis.    Referring Provider (PT) Rosita Kea PA-C.    Onset Date/Surgical Date --   Ongoing.     Precautions   Precaution Comments Monitored gait for additional safety.      Restrictions   Weight Bearing Restrictions No      Balance Screen   Has the patient fallen in the past 6 months No    Has the patient had a decrease in activity level because of a fear of falling?  No    Is the patient reluctant to leave their home because of a fear of falling?  No  [place] residence      Prior Function   Level of Independence Independent      Posture/Postural Control   Posture/Postural Control Postural limitations    Postural Limitations Rounded Shoulders;Forward head;Increased lumbar lordosis;Increased thoracic kyphosis      Deep Tendon Reflexes   DTR Assessment Site Biceps;Brachioradialis;Triceps;Patella;Achilles    Biceps DTR 0    Brachioradialis DTR --   RT= 1+/4+; 0/4+.   Triceps DTR 0    Patella DTR 0    Achilles DTR 0      ROM / Strength   AROM / PROM / Strength AROM;Strength      AROM   Overall AROM Comments Right active shoulder elevation= 110 degrees and left= 105 degrees.  Right active cervical rotation= 48 degrees and left rotation= 55 degrees.  Right hip flexion assessed in supine= 100 degrees and left= 90 degrees.        Strength   Overall Strength Comments Bilateral deltoid strength= 4+/5.  Bilateral hip abduction  strength is 4- to 4/5.  Normal bilateral quadriceps strength.      Palpation   Palpation comment Tender to palpation over bilateral UT's that also exhibit a great deal of tone.  He also reports pain in his upper thoracic region.  He reports some generalized pain across his lower lumbar region as well.      Special Tests   Other special tests (-) Romberg test though supervision recommended.      Transfers   Comments Sit to stand easily perfomed with UE usage.      Ambulation/Gait   Gait Pattern Decreased step length - left;Decreased stride length;Trunk flexed                      Objective measurements completed on examination: See above findings.       [facility] Adult PT Treatment/Exercise - 05/25/20 0001      Modalities   Modalities Electrical Stimulation;Moist Heat      Moist Heat Therapy   Number Minutes Moist Heat 20 Minutes    Moist Heat Location Cervical      Electrical Stimulation   Electrical Stimulation Location UT and upper thoracic    Electrical Stimulation Action IFC at 80-150 Hz x 20 minutes on 100% scan    Electrical Stimulation Goals Tone;Pain                       PT Long Term Goals - 05/25/20 1258      PT LONG TERM GOAL #1   Title Patient will be independent with HEP    Time 8    Period Weeks    Status New      PT LONG TERM GOAL #2   Title Patient will demonstrate 60 degrees or greater of bilateral cervical rotation to improve ability to scan environment    Time 8    Period Weeks    Status New      PT LONG TERM GOAL #3   Title Patient will report ability to perform ADLs with cervical pain less than or equal to 3/10    Time 8    Period Weeks    Status New                  Plan - 05/25/20 1251    Clinical Impression Statement The patient presenst to OPPT with c/o neck and upper thoracic pain and a desire to improve  his mobility.  He has been trying to stay active at home and has been walking daily with his wife.   His cervical range of motion is limited and his bilateral UT's present with a significant amount of tone.  He has bilateral hip abduction weakness.  His gait is slow and cautious with decreased step and stride lengths.  Patient will benefit from skilled physical therapy intervention to address deficits and pain.    Personal Factors and Comorbidities Comorbidity 1;Comorbidity 2    Comorbidities OA, HTN, Right ankle injury.    Examination-Activity Limitations Locomotion Level;Other    Examination-Participation Restrictions Other    Stability/Clinical Decision Making Evolving/Moderate complexity    Clinical Decision Making Low    Rehab Potential Fair    PT Frequency 2x / week    PT Duration 8 weeks    PT Treatment/Interventions ADLs/Self Care Home Management;Cryotherapy;Electrical Stimulation;Ultrasound;Moist Heat;Therapeutic activities;Therapeutic exercise;Manual techniques;Patient/family education;Passive range of motion;Dry needling    PT Next Visit Plan UBE, Nustep, pulleys, generalized U and LE ther ex, modalities as need to neck and upper back, dry needling.    Consulted and Agree with Plan of Care Patient           Patient will benefit from skilled therapeutic intervention in order to improve the following deficits and impairments:  Pain, Abnormal gait, Increased muscle spasms, Postural dysfunction, Decreased strength, Decreased mobility, Decreased activity tolerance, Decreased range of motion  Visit Diagnosis: Cervicalgia - Plan: PT plan of care cert/re-cert  Abnormal posture - Plan: PT plan of care cert/re-cert     Problem List Patient Active Problem List   Diagnosis Date Noted  . Ankylosing spondylitis of cervicothoracic region ([place]) 10/16/2016  . TIA (transient ischemic attack) 08/10/2015  . Carotid stenosis 08/04/2014  . Hyperlipidemia 02/21/2012  . BENIGN PROSTATIC HYPERTROPHY, WITH OBSTRUCTION 01/15/2010  . Essential hypertension 11/10/2009  . GERD 02/22/2008  .  NEPHROLITHIASIS, HX OF 11/17/2007    Elic Vencill, [name] MPT 05/25/2020, 1:01 PM  [facility] [address] [place], [place], [address] Phone: [phone]   Fax:  [phone]  Name: SUVAN STCYR MRN: [id number] Date of Birth: Jul 26, 1936

## 2020-05-29 ENCOUNTER — Encounter: Payer: Self-pay | Admitting: Physical Therapy

## 2020-05-29 ENCOUNTER — Ambulatory Visit: Payer: Medicare Other | Attending: Physician Assistant | Admitting: Physical Therapy

## 2020-05-29 ENCOUNTER — Other Ambulatory Visit: Payer: Self-pay

## 2020-05-29 DIAGNOSIS — M542 Cervicalgia: Secondary | ICD-10-CM | POA: Diagnosis not present

## 2020-05-29 DIAGNOSIS — R293 Abnormal posture: Secondary | ICD-10-CM | POA: Diagnosis not present

## 2020-05-29 NOTE — Therapy (Signed)
[name], [place], [address] Phone: [phone]   Fax:  [phone]  Physical Therapy Treatment  Patient Details  Name: [name] MRN: [id number] Date of Birth: August 20, 1936 Referring Provider (PT): Rosita Kea PA-C.   Encounter Date: 05/29/2020   PT End of Session - 05/29/20 1207    Visit Number 2    Number of Visits 16    Date for PT Re-Evaluation 08/23/20    PT Start Time 1115    PT Stop Time 1201    PT Time Calculation (min) 46 min    Activity Tolerance Patient tolerated treatment well    Behavior During Therapy [facility] for tasks assessed/performed           Past Medical History:  Diagnosis Date  . Ankylosing spondylitis ([place]) dx'd ~ 1974  . Arthritis    "back" (08/10/2015)  . BENIGN PROSTATIC HYPERTROPHY, WITH OBSTRUCTION 01/15/2010  . Bleeding duodenal ulcer   . Bleeding esophageal ulcer   . Bleeding stomach ulcer   . GERD 02/22/2008  . History of blood transfusion 1988   "lost ~ 1/2 of my blood volume from multiple bleeding ulcers"  . History of hiatal hernia   . HYPERGLYCEMIA 11/18/2007  . HYPERLIPIDEMIA 02/22/2008  . HYPERTENSION, UNSPECIFIED 11/10/2009  . Kidney stones   . Osteoarthritis    c-spine  . PEPTIC ULCER DISEASE 11/17/2007  . Situational depression    "son died in [place] 06/22/2015"  . TIA (transient ischemic attack)    "not that I know of in the past; they are trying to determine if I've had one today (08/10/2015)"    Past Surgical History:  Procedure Laterality Date  . CATARACT EXTRACTION W/ INTRAOCULAR LENS  IMPLANT, BILATERAL Bilateral 2013    There were no vitals filed for this visit.   Subjective Assessment - 05/29/20 1124    Subjective COVID-19 screen performed prior to patient entering clinic. Patient arrives with ongoing upper back and neck pain.    Pertinent History OA, HTN, Right ankle injury.    How long can you walk comfortably? Been walking about a mile recently.     Patient Stated Goals Decrease pain and move better.    Currently in Pain? Yes    Pain Score 4     Pain Location Neck    Pain Orientation Right;Left;Lower    Pain Descriptors / Indicators Sore    Pain Type Chronic pain    Pain Onset More than a month ago    Pain Frequency Constant              [facility] PT Assessment - 05/29/20 0001      Assessment   Medical Diagnosis Ankylosing Spondylitis.    Referring Provider (PT) Mar[place]e Staff                         [facility] Adult PT Treatment/Exercise - 05/29/20 0001      Exercises   Exercises Shoulder;Knee/Hip      Shoulder Exercises: Seated   Horizontal ABduction Strengthening;Both;20 reps;Theraband    Theraband Level (Shoulder Horizontal ABduction) Level 2 (Red)      Shoulder Exercises: Pulleys   Flexion 5 minutes      Shoulder Exercises: ROM/Strengthening   UBE (Upper Arm Bike) 120 RPM 8 mins standing      Shoulder Exercises: Stretch   Corner Stretch 3 reps;30 seconds      Modalities   Modalities Electrical  Stimulation;Moist Heat      Moist Heat Therapy   Number Minutes Moist Heat 10 Minutes    Moist Heat Location Cervical      Electrical Stimulation   Electrical Stimulation Location UT and upper thoracic    Electrical Stimulation Action IFC    Electrical Stimulation Parameters 80-150 hz x10 mins    Electrical Stimulation Goals Tone;Pain                       PT Long Term Goals - 05/25/20 1258      PT LONG TERM GOAL #1   Title Patient will be independent with HEP    Time 8    Period Weeks    Status New      PT LONG TERM GOAL #2   Title Patient will demonstrate 60 degrees or greater of bilateral cervical rotation to improve ability to scan environment    Time 8    Period Weeks    Status New      PT LONG TERM GOAL #3   Title Patient will report ability to perform ADLs with cervical pain less than or equal to 3/10    Time 8    Period Weeks    Status New                  Plan - 05/29/20 1207    Clinical Impression Statement Patient responded well to therapy session with reports of fatigue. Patient provided with verbal cuing for technique with improved form for remaining reps. Patient and PT discussed slow progression to 15 mins on nustep and 10 mins on UBE to prevent over exertion and increase muscle fatigue. Patient reported understanding. No adverse affects upon removal of modalities. Dry needling contraindicated due to medications, [name] Applegate, MPT discussed removing dry needling from plan of care with patient.    Personal Factors and Comorbidities Comorbidity 1;Comorbidity 2    Comorbidities OA, HTN, Right ankle injury.    Examination-Activity Limitations Locomotion Level;Other    Examination-Participation Restrictions Other    Stability/Clinical Decision Making Evolving/Moderate complexity    Clinical Decision Making Low    Rehab Potential Fair    PT Frequency 2x / week    PT Duration 8 weeks    PT Treatment/Interventions ADLs/Self Care Home Management;Cryotherapy;Electrical Stimulation;Ultrasound;Moist Heat;Therapeutic activities;Therapeutic exercise;Manual techniques;Patient/family education;Passive range of motion;Dry needling    PT Next Visit Plan UBE, Nustep, pulleys, generalized U and LE ther ex, modalities as need to neck and upper back.    Consulted and Agree with Plan of Care Patient           Patient will benefit from skilled therapeutic intervention in order to improve the following deficits and impairments:  Pain, Abnormal gait, Increased muscle spasms, Postural dysfunction, Decreased strength, Decreased mobility, Decreased activity tolerance, Decreased range of motion  Visit Diagnosis: Cervicalgia  Abnormal posture     Problem List Patient Active Problem List   Diagnosis Date Noted  . Ankylosing spondylitis of cervicothoracic region ([place]) 10/16/2016  . TIA (transient ischemic attack) 08/10/2015  . Carotid  stenosis 08/04/2014  . Hyperlipidemia 02/21/2012  . BENIGN PROSTATIC HYPERTROPHY, WITH OBSTRUCTION 01/15/2010  . Essential hypertension 11/10/2009  . GERD 02/22/2008  . NEPHROLITHIASIS, HX OF 11/17/2007    [name], PT, DPT 05/29/2020, 12:13 PM  [facility] [address] [place], [place], [address] Phone: [phone]   Fax:  [phone]  Name: KACY CONELY MRN: [id number] Date of Birth: Oct 21, 1935

## 2020-05-31 ENCOUNTER — Other Ambulatory Visit: Payer: Self-pay

## 2020-05-31 ENCOUNTER — Ambulatory Visit: Payer: Medicare Other | Admitting: Physical Therapy

## 2020-05-31 DIAGNOSIS — R293 Abnormal posture: Secondary | ICD-10-CM | POA: Diagnosis not present

## 2020-05-31 DIAGNOSIS — M542 Cervicalgia: Secondary | ICD-10-CM

## 2020-05-31 NOTE — Therapy (Signed)
[facility] [place], [place], [address] Phone: [phone]   Fax:  [phone]  Physical Therapy Treatment  Patient Details  Name: [name] MRN: [id number] Date of Birth: 1936/01/18 Referring Provider (PT): Rosita Kea PA-C.   Encounter Date: 05/31/2020   PT End of Session - 05/31/20 1155    Visit Number 3    Number of Visits 16    Date for PT Re-Evaluation 08/23/20    PT Start Time 1030    PT Stop Time 1123    PT Time Calculation (min) 53 min    Activity Tolerance Patient tolerated treatment well    Behavior During Therapy WFL for tasks assessed/performed           Past Medical History:  Diagnosis Date  . Ankylosing spondylitis ([place]) dx'd ~ 1974  . Arthritis    "back" (08/10/2015)  . BENIGN PROSTATIC HYPERTROPHY, WITH OBSTRUCTION 01/15/2010  . Bleeding duodenal ulcer   . Bleeding esophageal ulcer   . Bleeding stomach ulcer   . GERD 02/22/2008  . History of blood transfusion 1988   "lost ~ 1/2 of my blood volume from multiple bleeding ulcers"  . History of hiatal hernia   . HYPERGLYCEMIA 11/18/2007  . HYPERLIPIDEMIA 02/22/2008  . HYPERTENSION, UNSPECIFIED 11/10/2009  . Kidney stones   . Osteoarthritis    c-spine  . PEPTIC ULCER DISEASE 11/17/2007  . Situational depression    "son died in [place] 06-30-15"  . TIA (transient ischemic attack)    "not that I know of in the past; they are trying to determine if I've had one today (08/10/2015)"    Past Surgical History:  Procedure Laterality Date  . CATARACT EXTRACTION W/ INTRAOCULAR LENS  IMPLANT, BILATERAL Bilateral 2013    There were no vitals filed for this visit.   Subjective Assessment - 05/31/20 1109    Subjective COVID-19 screen performed prior to patient entering clinic.  Neck pain in a moderate range today.    Pertinent History OA, HTN, Right ankle injury.    How long can you walk comfortably? Been walking about a mile recently.    Patient Stated  Goals Decrease pain and move better.    Currently in Pain? Yes    Pain Location Neck    Pain Orientation Right;Left    Pain Descriptors / Indicators Sore    Pain Type Chronic pain    Pain Onset More than a month ago                             [facility] Adult PT Treatment/Exercise - 05/31/20 0001      Exercises   Exercises Shoulder      Shoulder Exercises: ROM/Strengthening   UBE (Upper Arm Bike) 120 RPM's x 8 minutes with patient performing while standing.      Modalities   Modalities Electrical Stimulation;Moist Heat;Ultrasound      Moist Heat Therapy   Number Minutes Moist Heat 19 Minutes    Moist Heat Location Cervical      Electrical Stimulation   Electrical Stimulation Location Bilateral UT's.    Electrical Stimulation Action IFC    Electrical Stimulation Parameters 80-150 Hz x 19 minutes.    Electrical Stimulation Goals Tone;Pain      Ultrasound   Ultrasound Location Bilateral     Ultrasound Parameters Combo e'stim/US at 1.50 W/CM2 x 8 minutes.      Manual Therapy   Manual  Therapy Soft tissue mobilization    Soft tissue mobilization STW/M x 7 minutes to patient's bilateral UT's to redue tone.                       PT Long Term Goals - 05/25/20 1258      PT LONG TERM GOAL #1   Title Patient will be independent with HEP    Time 8    Period Weeks    Status New      PT LONG TERM GOAL #2   Title Patient will demonstrate 60 degrees or greater of bilateral cervical rotation to improve ability to scan environment    Time 8    Period Weeks    Status New      PT LONG TERM GOAL #3   Title Patient will report ability to perform ADLs with cervical pain less than or equal to 3/10    Time 8    Period Weeks    Status New                 Plan - 05/31/20 1112    Clinical Impression Statement The patient is very motivated to improve.  Focused treatment on neck pain today.  Patient did well with treatment today.    Personal Factors  and Comorbidities Comorbidity 1;Comorbidity 2    Comorbidities OA, HTN, Right ankle injury.    Examination-Activity Limitations Locomotion Level;Other    Examination-Participation Restrictions Other    Stability/Clinical Decision Making Evolving/Moderate complexity    Rehab Potential Fair    PT Frequency 2x / week    PT Duration 8 weeks    PT Treatment/Interventions ADLs/Self Care Home Management;Cryotherapy;Electrical Stimulation;Ultrasound;Moist Heat;Therapeutic activities;Therapeutic exercise;Manual techniques;Patient/family education;Passive range of motion;Dry needling    PT Next Visit Plan UBE, Nustep, pulleys, generalized U and LE ther ex, modalities as need to neck and upper back.    Consulted and Agree with Plan of Care Patient           Patient will benefit from skilled therapeutic intervention in order to improve the following deficits and impairments:  Pain, Abnormal gait, Increased muscle spasms, Postural dysfunction, Decreased strength, Decreased mobility, Decreased activity tolerance, Decreased range of motion  Visit Diagnosis: Cervicalgia  Abnormal posture     Problem List Patient Active Problem List   Diagnosis Date Noted  . Ankylosing spondylitis of cervicothoracic region ([place]) 10/16/2016  . TIA (transient ischemic attack) 08/10/2015  . Carotid stenosis 08/04/2014  . Hyperlipidemia 02/21/2012  . BENIGN PROSTATIC HYPERTROPHY, WITH OBSTRUCTION 01/15/2010  . Essential hypertension 11/10/2009  . GERD 02/22/2008  . NEPHROLITHIASIS, HX OF 11/17/2007    Tajana Crotteau, [name] MPT 05/31/2020, 11:56 AM  [facility] [address] [place], [place], [address] Phone: [phone]   Fax:  [phone]  Name: [name] MRN: [id number] Date of Birth: Nov 13, 1935

## 2020-06-05 ENCOUNTER — Ambulatory Visit: Payer: Medicare Other | Admitting: Physical Therapy

## 2020-06-05 ENCOUNTER — Other Ambulatory Visit: Payer: Self-pay

## 2020-06-05 DIAGNOSIS — M542 Cervicalgia: Secondary | ICD-10-CM

## 2020-06-05 DIAGNOSIS — R293 Abnormal posture: Secondary | ICD-10-CM

## 2020-06-05 NOTE — Therapy (Signed)
[facility] [place], [place], [address] Phone: [phone]   Fax:  [phone]  Physical Therapy Treatment  Patient Details  Name: [name] MRN: [id number] Date of Birth: [birth year] Referring Provider (PT): Rosita Kea PA-C.   Encounter Date: 06/05/2020   PT End of Session - 06/05/20 1032    Visit Number 4    Number of Visits 16    Date for PT Re-Evaluation 08/23/20    PT Start Time 1030    PT Stop Time 1123    PT Time Calculation (min) 53 min    Activity Tolerance Patient tolerated treatment well    Behavior During Therapy WFL for tasks assessed/performed           Past Medical History:  Diagnosis Date  . Ankylosing spondylitis ([place]) dx'd ~ 1974  . Arthritis    "back" (08/10/2015)  . BENIGN PROSTATIC HYPERTROPHY, WITH OBSTRUCTION 01/15/2010  . Bleeding duodenal ulcer   . Bleeding esophageal ulcer   . Bleeding stomach ulcer   . GERD 02/22/2008  . History of blood transfusion 1988   "lost ~ 1/2 of my blood volume from multiple bleeding ulcers"  . History of hiatal hernia   . HYPERGLYCEMIA 11/18/2007  . HYPERLIPIDEMIA 02/22/2008  . HYPERTENSION, UNSPECIFIED 11/10/2009  . Kidney stones   . Osteoarthritis    c-spine  . PEPTIC ULCER DISEASE 11/17/2007  . Situational depression    "son died in [place] 06/25/15"  . TIA (transient ischemic attack)    "not that I know of in the past; they are trying to determine if I've had one today (08/10/2015)"    Past Surgical History:  Procedure Laterality Date  . CATARACT EXTRACTION W/ INTRAOCULAR LENS  IMPLANT, BILATERAL Bilateral 2013    There were no vitals filed for this visit.   Subjective Assessment - 06/05/20 1034    Subjective COVID-19 screening performed upon arrival. Reports feeling stiff today. "Moderate" pain    Pertinent History OA, HTN, Right ankle injury.    How long can you walk comfortably? Been walking about a mile recently.    Patient Stated Goals  Decrease pain and move better.    Currently in Pain? Yes    Pain Score --   "moderate pain" no number provided on pain scale             [facility] PT Assessment - 06/05/20 0001      Assessment   Medical Diagnosis Ankylosing Spondylitis.    Referring Provider (PT) Marvene Staff                         [facility] Adult PT Treatment/Exercise - 06/05/20 0001      Exercises   Exercises Shoulder      Knee/Hip Exercises: Aerobic   Nustep level 4 x10 mins with UE support      Shoulder Exercises: Pulleys   Flexion 5 minutes      Shoulder Exercises: ROM/Strengthening   UBE (Upper Arm Bike) 120 RPM's x 10 minutes standing.      Modalities   Modalities Electrical Stimulation;Moist Heat;Ultrasound      Moist Heat Therapy   Number Minutes Moist Heat 10 Minutes    Moist Heat Location Cervical      Electrical Stimulation   Electrical Stimulation Location Bilateral UT's.    Electrical Stimulation Action IFC    Electrical Stimulation Parameters 80-150 hz x10 mins    Electrical Stimulation Goals  Tone;Pain      Ultrasound   Ultrasound Location Bilateral UTs    Ultrasound Parameters combo e-stim/US 1.5 w/cm2 50mhz x8 mins    Ultrasound Goals Pain                       PT Long Term Goals - 05/25/20 1258      PT LONG TERM GOAL #1   Title Patient will be independent with HEP    Time 8    Period Weeks    Status New      PT LONG TERM GOAL #2   Title Patient will demonstrate 60 degrees or greater of bilateral cervical rotation to improve ability to scan environment    Time 8    Period Weeks    Status New      PT LONG TERM GOAL #3   Title Patient will report ability to perform ADLs with cervical pain less than or equal to 3/10    Time 8    Period Weeks    Status New                 Plan - 06/05/20 1114    Clinical Impression Statement Patient responded well to therapy session. Patient noted a decrease in stiffness in cervical spine after TEs  and a decrease in pain after combo US/E-stim. Patient and PT discussed plan of care of both TEs for strengthening and mobility and modalities for pain relief. Patient reported understanding. No adverse affects upon removal of modalities.    Personal Factors and Comorbidities Comorbidity 1;Comorbidity 2    Comorbidities OA, HTN, Right ankle injury.    Examination-Activity Limitations Locomotion Level;Other    Examination-Participation Restrictions Other    Stability/Clinical Decision Making Evolving/Moderate complexity    Clinical Decision Making Low    Rehab Potential Fair    PT Frequency 2x / week    PT Duration 8 weeks    PT Treatment/Interventions ADLs/Self Care Home Management;Cryotherapy;Electrical Stimulation;Ultrasound;Moist Heat;Therapeutic activities;Therapeutic exercise;Manual techniques;Patient/family education;Passive range of motion;Dry needling    PT Next Visit Plan UBE, Nustep, pulleys, generalized U and LE ther ex, modalities as need to neck and upper back.    Consulted and Agree with Plan of Care Patient           Patient will benefit from skilled therapeutic intervention in order to improve the following deficits and impairments:  Pain, Abnormal gait, Increased muscle spasms, Postural dysfunction, Decreased strength, Decreased mobility, Decreased activity tolerance, Decreased range of motion  Visit Diagnosis: Cervicalgia  Abnormal posture     Problem List Patient Active Problem List   Diagnosis Date Noted  . Ankylosing spondylitis of cervicothoracic region ([place]) 10/16/2016  . TIA (transient ischemic attack) 08/10/2015  . Carotid stenosis 08/04/2014  . Hyperlipidemia 02/21/2012  . BENIGN PROSTATIC HYPERTROPHY, WITH OBSTRUCTION 01/15/2010  . Essential hypertension 11/10/2009  . GERD 02/22/2008  . NEPHROLITHIASIS, HX OF 11/17/2007    Gabriela Eves, PT, DPT 06/05/2020, 12:25 PM  [facility] [address] [place], [place], [address] Phone: [phone]   Fax:  [phone]  Name: BADR PIEDRA MRN: [id number] Date of Birth: 1936-05-07

## 2020-06-12 ENCOUNTER — Other Ambulatory Visit: Payer: Self-pay

## 2020-06-12 ENCOUNTER — Ambulatory Visit: Payer: Medicare Other | Admitting: Physical Therapy

## 2020-06-12 DIAGNOSIS — M542 Cervicalgia: Secondary | ICD-10-CM | POA: Diagnosis not present

## 2020-06-12 DIAGNOSIS — R293 Abnormal posture: Secondary | ICD-10-CM | POA: Diagnosis not present

## 2020-06-12 NOTE — Therapy (Signed)
[facility] [place], [place], [address] Phone: [phone]   Fax:  [phone]  Physical Therapy Treatment  Patient Details  Name: [name] MRN: [id number] Date of Birth: 02/06/1936 Referring Provider (PT): Rosita Kea PA-C.   Encounter Date: 06/12/2020   PT End of Session - 06/12/20 1038    Visit Number 5    Number of Visits 16    Date for PT Re-Evaluation 08/23/20    PT Start Time 1031    PT Stop Time 1124    PT Time Calculation (min) 53 min    Activity Tolerance Patient tolerated treatment well    Behavior During Therapy WFL for tasks assessed/performed           Past Medical History:  Diagnosis Date  . Ankylosing spondylitis ([place]) dx'd ~ 1974  . Arthritis    "back" (08/10/2015)  . BENIGN PROSTATIC HYPERTROPHY, WITH OBSTRUCTION 01/15/2010  . Bleeding duodenal ulcer   . Bleeding esophageal ulcer   . Bleeding stomach ulcer   . GERD 02/22/2008  . History of blood transfusion 1988   "lost ~ 1/2 of my blood volume from multiple bleeding ulcers"  . History of hiatal hernia   . HYPERGLYCEMIA 11/18/2007  . HYPERLIPIDEMIA 02/22/2008  . HYPERTENSION, UNSPECIFIED 11/10/2009  . Kidney stones   . Osteoarthritis    c-spine  . PEPTIC ULCER DISEASE 11/17/2007  . Situational depression    "son died in [place] 07-13-15"  . TIA (transient ischemic attack)    "not that I know of in the past; they are trying to determine if I've had one today (08/10/2015)"    Past Surgical History:  Procedure Laterality Date  . CATARACT EXTRACTION W/ INTRAOCULAR LENS  IMPLANT, BILATERAL Bilateral 2013    There were no vitals filed for this visit.   Subjective Assessment - 06/12/20 1032    Subjective COVID-19 screen performed prior to patient entering clinic.  No new complaints.    Pertinent History OA, HTN, Right ankle injury.    How long can you walk comfortably? Been walking about a mile recently.    Patient Stated Goals Decrease  pain and move better.    Pain Location Neck    Pain Orientation Left    Pain Descriptors / Indicators Sore    Pain Onset More than a month ago                             [facility] Adult PT Treatment/Exercise - 06/12/20 0001      Exercises   Exercises Knee/Hip      Knee/Hip Exercises: Aerobic   Nustep Level 5 x 10 minutes.      Shoulder Exercises: Pulleys   Flexion 5 minutes      Shoulder Exercises: ROM/Strengthening   UBE (Upper Arm Bike) 90 RPM's x 10 minutes.      Modalities   Modalities Electrical Stimulation;Moist Heat      Moist Heat Therapy   Number Minutes Moist Heat 20 Minutes    Moist Heat Location Cervical      Electrical Stimulation   Electrical Stimulation Location Bil UT's.    Electrical Stimulation Action IFC    Electrical Stimulation Parameters 80-150 Hz at 100% scan x 20 minutes.    Electrical Stimulation Goals Tone                       PT Long Term Goals -  05/25/20 1258      PT LONG TERM GOAL #1   Title Patient will be independent with HEP    Time 8    Period Weeks    Status New      PT LONG TERM GOAL #2   Title Patient will demonstrate 60 degrees or greater of bilateral cervical rotation to improve ability to scan environment    Time 8    Period Weeks    Status New      PT LONG TERM GOAL #3   Title Patient will report ability to perform ADLs with cervical pain less than or equal to 3/10    Time 8    Period Weeks    Status New                 Plan - 06/12/20 1106    Clinical Impression Statement Patient highly motivated.  Progressed exercise with UBE at 90 RPM's and level 5 on Nustep without complaint.    Personal Factors and Comorbidities Comorbidity 1;Comorbidity 2    Comorbidities OA, HTN, Right ankle injury.    Examination-Activity Limitations Locomotion Level;Other    Examination-Participation Restrictions Other    Stability/Clinical Decision Making Evolving/Moderate complexity    Rehab  Potential Fair    PT Frequency 2x / week    PT Duration 8 weeks    PT Treatment/Interventions ADLs/Self Care Home Management;Cryotherapy;Electrical Stimulation;Ultrasound;Moist Heat;Therapeutic activities;Therapeutic exercise;Manual techniques;Patient/family education;Passive range of motion;Dry needling    PT Next Visit Plan UBE, Nustep, pulleys, generalized U and LE ther ex, modalities as need to neck and upper back.    Consulted and Agree with Plan of Care Patient           Patient will benefit from skilled therapeutic intervention in order to improve the following deficits and impairments:     Visit Diagnosis: Cervicalgia  Abnormal posture     Problem List Patient Active Problem List   Diagnosis Date Noted  . Ankylosing spondylitis of cervicothoracic region ([place]) 10/16/2016  . TIA (transient ischemic attack) 08/10/2015  . Carotid stenosis 08/04/2014  . Hyperlipidemia 02/21/2012  . BENIGN PROSTATIC HYPERTROPHY, WITH OBSTRUCTION 01/15/2010  . Essential hypertension 11/10/2009  . GERD 02/22/2008  . NEPHROLITHIASIS, HX OF 11/17/2007    Darlinda Bellows, [name] MPT 06/12/2020, 11:28 AM  [facility] [address] [place], [place], [address] Phone: [phone]   Fax:  [phone]  Name: [name] MRN: [id number] Date of Birth: 1936-02-09

## 2020-06-15 DIAGNOSIS — M79676 Pain in unspecified toe(s): Secondary | ICD-10-CM | POA: Diagnosis not present

## 2020-06-15 DIAGNOSIS — B351 Tinea unguium: Secondary | ICD-10-CM | POA: Diagnosis not present

## 2020-06-16 DIAGNOSIS — Z23 Encounter for immunization: Secondary | ICD-10-CM | POA: Diagnosis not present

## 2020-06-19 ENCOUNTER — Ambulatory Visit: Payer: Medicare Other | Admitting: Physical Therapy

## 2020-06-19 ENCOUNTER — Other Ambulatory Visit: Payer: Self-pay

## 2020-06-19 ENCOUNTER — Encounter: Payer: Self-pay | Admitting: Physical Therapy

## 2020-06-19 DIAGNOSIS — R293 Abnormal posture: Secondary | ICD-10-CM

## 2020-06-19 DIAGNOSIS — M542 Cervicalgia: Secondary | ICD-10-CM

## 2020-06-19 NOTE — Therapy (Signed)
[facility] [place], [place], [address] Phone: [phone]   Fax:  [phone]  Physical Therapy Treatment  Patient Details  Name: [name] MRN: [id number] Date of Birth: 08/12/1936 Referring Provider (PT): Rosita Kea PA-C.   Encounter Date: 06/19/2020   PT End of Session - 06/19/20 1108    Visit Number 6    Number of Visits 16    Date for PT Re-Evaluation 08/23/20    PT Start Time 1030    PT Stop Time 1119    PT Time Calculation (min) 49 min    Activity Tolerance Patient tolerated treatment well    Behavior During Therapy WFL for tasks assessed/performed           Past Medical History:  Diagnosis Date  . Ankylosing spondylitis ([place]) dx'd ~ 1974  . Arthritis    "back" (08/10/2015)  . BENIGN PROSTATIC HYPERTROPHY, WITH OBSTRUCTION 01/15/2010  . Bleeding duodenal ulcer   . Bleeding esophageal ulcer   . Bleeding stomach ulcer   . GERD 02/22/2008  . History of blood transfusion 1988   "lost ~ 1/2 of my blood volume from multiple bleeding ulcers"  . History of hiatal hernia   . HYPERGLYCEMIA 11/18/2007  . HYPERLIPIDEMIA 02/22/2008  . HYPERTENSION, UNSPECIFIED 11/10/2009  . Kidney stones   . Osteoarthritis    c-spine  . PEPTIC ULCER DISEASE 11/17/2007  . Situational depression    "son died in [place] 2015/07/04"  . TIA (transient ischemic attack)    "not that I know of in the past; they are trying to determine if I've had one today (08/10/2015)"    Past Surgical History:  Procedure Laterality Date  . CATARACT EXTRACTION W/ INTRAOCULAR LENS  IMPLANT, BILATERAL Bilateral 2013    There were no vitals filed for this visit.   Subjective Assessment - 06/19/20 1051    Subjective COVID-19 screen performed prior to patient entering clinic.  Patient arrives doing fairly well.    Pertinent History OA, HTN, Right ankle injury.    How long can you walk comfortably? Been walking about a mile recently.    Patient Stated  Goals Decrease pain and move better.    Currently in Pain? Yes   did not provide number on pain scale             [facility] PT Assessment - 06/19/20 0001      Assessment   Medical Diagnosis Ankylosing Spondylitis.    Referring Provider (PT) Marvene Staff                         [facility] Adult PT Treatment/Exercise - 06/19/20 0001      Exercises   Exercises Shoulder      Knee/Hip Exercises: Aerobic   Nustep Level 4-5 x 10 minutes.      Shoulder Exercises: Standing   Diagonals Strengthening;Both;20 reps   Blue XTS     Shoulder Exercises: Pulleys   Flexion 5 minutes      Shoulder Exercises: ROM/Strengthening   UBE (Upper Arm Bike) 90 RPM's x 10 mins standing      Modalities   Modalities Electrical Stimulation;Moist Heat      Moist Heat Therapy   Number Minutes Moist Heat 15 Minutes    Moist Heat Location Cervical      Electrical Stimulation   Electrical Stimulation Location bilateral UTs    Electrical Stimulation Action IFC    Electrical Stimulation Parameters  80-150 hz x 15 mins    Electrical Stimulation Goals Tone                       PT Long Term Goals - 05/25/20 1258      PT LONG TERM GOAL #1   Title Patient will be independent with HEP    Time 8    Period Weeks    Status New      PT LONG TERM GOAL #2   Title Patient will demonstrate 60 degrees or greater of bilateral cervical rotation to improve ability to scan environment    Time 8    Period Weeks    Status New      PT LONG TERM GOAL #3   Title Patient will report ability to perform ADLs with cervical pain less than or equal to 3/10    Time 8    Period Weeks    Status New                 Plan - 06/19/20 1100    Clinical Impression Statement Patient responded well to therapy session with no reports of increased pain, just fatigue. Chop/lift diagnonal performed which required verbal cuing for technique. Patient demonstrated good form for about 85% of the reps.  Patient with difficulties with eccentric control. No adverse affects upon removal of modalities.    Personal Factors and Comorbidities Comorbidity 1;Comorbidity 2    Comorbidities OA, HTN, Right ankle injury.    Examination-Activity Limitations Locomotion Level;Other    Examination-Participation Restrictions Other    Stability/Clinical Decision Making Evolving/Moderate complexity    Clinical Decision Making Low    Rehab Potential Fair    PT Frequency 2x / week    PT Duration 8 weeks    PT Treatment/Interventions ADLs/Self Care Home Management;Cryotherapy;Electrical Stimulation;Ultrasound;Moist Heat;Therapeutic activities;Therapeutic exercise;Manual techniques;Patient/family education;Passive range of motion;Dry needling    PT Next Visit Plan UBE, Nustep, pulleys, generalized U and LE ther ex, modalities as need to neck and upper back.    Consulted and Agree with Plan of Care Patient           Patient will benefit from skilled therapeutic intervention in order to improve the following deficits and impairments:  Pain, Abnormal gait, Increased muscle spasms, Postural dysfunction, Decreased strength, Decreased mobility, Decreased activity tolerance, Decreased range of motion  Visit Diagnosis: Cervicalgia  Abnormal posture     Problem List Patient Active Problem List   Diagnosis Date Noted  . Ankylosing spondylitis of cervicothoracic region ([place]) 10/16/2016  . TIA (transient ischemic attack) 08/10/2015  . Carotid stenosis 08/04/2014  . Hyperlipidemia 02/21/2012  . BENIGN PROSTATIC HYPERTROPHY, WITH OBSTRUCTION 01/15/2010  . Essential hypertension 11/10/2009  . GERD 02/22/2008  . NEPHROLITHIASIS, HX OF 11/17/2007    Gabriela Eves, PT, DPT 06/19/2020, 11:10 AM  [facility] [address] [place], [place], [address] Phone: [phone]   Fax:  [phone]  Name: [name] MRN: [id number] Date of Birth: September 04, 1935

## 2020-06-21 ENCOUNTER — Other Ambulatory Visit: Payer: Self-pay

## 2020-06-21 ENCOUNTER — Ambulatory Visit: Payer: Medicare Other | Admitting: Physical Therapy

## 2020-06-21 DIAGNOSIS — M542 Cervicalgia: Secondary | ICD-10-CM

## 2020-06-21 DIAGNOSIS — R293 Abnormal posture: Secondary | ICD-10-CM | POA: Diagnosis not present

## 2020-06-21 NOTE — Therapy (Signed)
[facility] [place], [place], [address] Phone: [phone]   Fax:  [phone]  Physical Therapy Treatment  Patient Details  Name: [name] MRN: [id number] Date of Birth: 20-Aug-1936 Referring Provider (PT): Rosita Kea PA-C.   Encounter Date: 06/21/2020   PT End of Session - 06/21/20 1135    Visit Number 7    Number of Visits 16    Date for PT Re-Evaluation 08/23/20    PT Start Time 1030    PT Stop Time 1117    PT Time Calculation (min) 47 min    Activity Tolerance Patient tolerated treatment well    Behavior During Therapy [facility] for tasks assessed/performed           Past Medical History:  Diagnosis Date  . Ankylosing spondylitis ([place]) dx'd ~ 1974  . Arthritis    "back" (08/10/2015)  . BENIGN PROSTATIC HYPERTROPHY, WITH OBSTRUCTION 01/15/2010  . Bleeding duodenal ulcer   . Bleeding esophageal ulcer   . Bleeding stomach ulcer   . GERD 02/22/2008  . History of blood transfusion 1988   "lost ~ 1/2 of my blood volume from multiple bleeding ulcers"  . History of hiatal hernia   . HYPERGLYCEMIA 11/18/2007  . HYPERLIPIDEMIA 02/22/2008  . HYPERTENSION, UNSPECIFIED 11/10/2009  . Kidney stones   . Osteoarthritis    c-spine  . PEPTIC ULCER DISEASE 11/17/2007  . Situational depression    "son died in [place] 2015/07/10"  . TIA (transient ischemic attack)    "not that I know of in the past; they are trying to determine if I've had one today (08/10/2015)"    Past Surgical History:  Procedure Laterality Date  . CATARACT EXTRACTION W/ INTRAOCULAR LENS  IMPLANT, BILATERAL Bilateral 2013    There were no vitals filed for this visit.   Subjective Assessment - 06/21/20 1107    Subjective COVID-19 screen performed prior to patient entering clinic.  No new complaints.    Pertinent History OA, HTN, Right ankle injury.    How long can you walk comfortably? Been walking about a mile recently.    Patient Stated Goals Decrease  pain and move better.    Currently in Pain? Yes    Pain Score --   moderate.   Pain Location Neck    Pain Orientation Right;Left    Pain Descriptors / Indicators Sore    Pain Type Chronic pain    Pain Onset More than a month ago                             [facility] Adult PT Treatment/Exercise - 06/21/20 0001      Exercises   Exercises Knee/Hip      Knee/Hip Exercises: Aerobic   Nustep Level 5 x 10 minutes.      Shoulder Exercises: Standing   Other Standing Exercises Blue XTS scap retraction with low and then high elbows f/b forward punches to fatigue.      Shoulder Exercises: Pulleys   Flexion 5 minutes      Shoulder Exercises: ROM/Strengthening   UBE (Upper Arm Bike) 90 RPM's in standing x 8 minutes.      Moist Heat Therapy   Number Minutes Moist Heat 15 Minutes    Moist Heat Location Cervical      Electrical Stimulation   Electrical Stimulation Location Bil UT's.    Electrical Stimulation Action IFC    Electrical Stimulation Parameters 80-150  Hz x 15 minutes on 100% scan.    Electrical Stimulation Goals Tone;Pain                       PT Long Term Goals - 05/25/20 1258      PT LONG TERM GOAL #1   Title Patient will be independent with HEP    Time 8    Period Weeks    Status New      PT LONG TERM GOAL #2   Title Patient will demonstrate 60 degrees or greater of bilateral cervical rotation to improve ability to scan environment    Time 8    Period Weeks    Status New      PT LONG TERM GOAL #3   Title Patient will report ability to perform ADLs with cervical pain less than or equal to 3/10    Time 8    Period Weeks    Status New                 Plan - 06/21/20 1159    Clinical Impression Statement Patient remains very motivated during his therapy sessions.  Good form and technique with ther ex.    Personal Factors and Comorbidities Comorbidity 1;Comorbidity 2    Comorbidities OA, HTN, Right ankle injury.     Examination-Activity Limitations Locomotion Level;Other    Examination-Participation Restrictions Other    Stability/Clinical Decision Making Evolving/Moderate complexity    Rehab Potential Fair    PT Frequency 2x / week    PT Duration 8 weeks    PT Treatment/Interventions ADLs/Self Care Home Management;Cryotherapy;Electrical Stimulation;Ultrasound;Moist Heat;Therapeutic activities;Therapeutic exercise;Manual techniques;Patient/family education;Passive range of motion;Dry needling    PT Next Visit Plan UBE, Nustep, pulleys, generalized U and LE ther ex, modalities as need to neck and upper back.    Consulted and Agree with Plan of Care Patient           Patient will benefit from skilled therapeutic intervention in order to improve the following deficits and impairments:  Pain, Abnormal gait, Increased muscle spasms, Postural dysfunction, Decreased strength, Decreased mobility, Decreased activity tolerance, Decreased range of motion  Visit Diagnosis: Cervicalgia  Abnormal posture     Problem List Patient Active Problem List   Diagnosis Date Noted  . Ankylosing spondylitis of cervicothoracic region ([place]) 10/16/2016  . TIA (transient ischemic attack) 08/10/2015  . Carotid stenosis 08/04/2014  . Hyperlipidemia 02/21/2012  . BENIGN PROSTATIC HYPERTROPHY, WITH OBSTRUCTION 01/15/2010  . Essential hypertension 11/10/2009  . GERD 02/22/2008  . NEPHROLITHIASIS, HX OF 11/17/2007    Roseline Ebarb, [name] MPT 06/21/2020, 12:02 PM  [facility] [address] [place], [place], [address] Phone: [phone]   Fax:  [phone]  Name: [name] MRN: [id number] Date of Birth: 1936/01/23

## 2020-06-26 ENCOUNTER — Ambulatory Visit: Payer: Medicare Other | Attending: Physician Assistant | Admitting: Physical Therapy

## 2020-06-26 ENCOUNTER — Other Ambulatory Visit: Payer: Self-pay

## 2020-06-26 DIAGNOSIS — R293 Abnormal posture: Secondary | ICD-10-CM | POA: Insufficient documentation

## 2020-06-26 DIAGNOSIS — M542 Cervicalgia: Secondary | ICD-10-CM | POA: Diagnosis not present

## 2020-06-26 NOTE — Therapy (Signed)
[facility] [place], [place], [address] Phone: [phone]   Fax:  [phone]  Physical Therapy Treatment  Patient Details  Name: [name] MRN: [id number] Date of Birth: 06-16-1936 Referring Provider (PT): Rosita Kea PA-C.   Encounter Date: 06/26/2020   PT End of Session - 06/26/20 1114    Visit Number 8    Number of Visits 16    Date for PT Re-Evaluation 08/23/20    PT Start Time 1030    PT Stop Time 1125    PT Time Calculation (min) 55 min    Activity Tolerance Patient tolerated treatment well    Behavior During Therapy WFL for tasks assessed/performed           Past Medical History:  Diagnosis Date  . Ankylosing spondylitis ([place]) dx'd ~ 1974  . Arthritis    "back" (08/10/2015)  . BENIGN PROSTATIC HYPERTROPHY, WITH OBSTRUCTION 01/15/2010  . Bleeding duodenal ulcer   . Bleeding esophageal ulcer   . Bleeding stomach ulcer   . GERD 02/22/2008  . History of blood transfusion 1988   "lost ~ 1/2 of my blood volume from multiple bleeding ulcers"  . History of hiatal hernia   . HYPERGLYCEMIA 11/18/2007  . HYPERLIPIDEMIA 02/22/2008  . HYPERTENSION, UNSPECIFIED 11/10/2009  . Kidney stones   . Osteoarthritis    c-spine  . PEPTIC ULCER DISEASE 11/17/2007  . Situational depression    "son died in [place] 18-Jun-2015"  . TIA (transient ischemic attack)    "not that I know of in the past; they are trying to determine if I've had one today (08/10/2015)"    Past Surgical History:  Procedure Laterality Date  . CATARACT EXTRACTION W/ INTRAOCULAR LENS  IMPLANT, BILATERAL Bilateral 2013    There were no vitals filed for this visit.   Subjective Assessment - 06/26/20 1059    Subjective COVID-19 screen performed prior to patient entering clinic.  No new complaints.    Pertinent History OA, HTN, Right ankle injury.    How long can you walk comfortably? Been walking about a mile recently.    Patient Stated Goals Decrease pain  and move better.    Currently in Pain? Yes    Pain Score --   Moderate.   Pain Location Neck    Pain Orientation Right;Left    Pain Descriptors / Indicators Sore    Pain Type Chronic pain    Pain Onset More than a month ago                             [facility] Adult PT Treatment/Exercise - 06/26/20 0001      Exercises   Exercises Shoulder;Knee/Hip      Knee/Hip Exercises: Aerobic   Nustep Level 5 x 10 minutes.      Shoulder Exercises: Standing   Other Standing Exercises Blue XTS lat PD, scap retraction low elbows and high elbows      Shoulder Exercises: Pulleys   Flexion 5 minutes      Shoulder Exercises: ROM/Strengthening   UBE (Upper Arm Bike) 90 RPM's x 8 minutes.      Modalities   Modalities Electrical Stimulation;Moist Heat      Moist Heat Therapy   Number Minutes Moist Heat 20 Minutes    Moist Heat Location Cervical      Electrical Stimulation   Electrical Stimulation Location Bilateral cervical musculature.    Electrical Stimulation Action IFC  Electrical Stimulation Parameters 80-150 Hz x 20 minutes.    Electrical Stimulation Goals Tone;Pain                       PT Long Term Goals - 05/25/20 1258      PT LONG TERM GOAL #1   Title Patient will be independent with HEP    Time 8    Period Weeks    Status New      PT LONG TERM GOAL #2   Title Patient will demonstrate 60 degrees or greater of bilateral cervical rotation to improve ability to scan environment    Time 8    Period Weeks    Status New      PT LONG TERM GOAL #3   Title Patient will report ability to perform ADLs with cervical pain less than or equal to 3/10    Time 8    Period Weeks    Status New                 Plan - 06/26/20 1111    Clinical Impression Statement The patient is highly motivated and does his exercises with excellent technique.    Personal Factors and Comorbidities Comorbidity 1;Comorbidity 2    Comorbidities OA, HTN, Right ankle  injury.    Examination-Activity Limitations Locomotion Level;Other    Examination-Participation Restrictions Other    Stability/Clinical Decision Making Evolving/Moderate complexity    Rehab Potential Fair    PT Frequency 2x / week    PT Duration 8 weeks    PT Treatment/Interventions ADLs/Self Care Home Management;Cryotherapy;Electrical Stimulation;Ultrasound;Moist Heat;Therapeutic activities;Therapeutic exercise;Manual techniques;Patient/family education;Passive range of motion;Dry needling    PT Next Visit Plan UBE, Nustep, pulleys, generalized U and LE ther ex, modalities as need to neck and upper back.    Consulted and Agree with Plan of Care Patient           Patient will benefit from skilled therapeutic intervention in order to improve the following deficits and impairments:  Pain, Abnormal gait, Increased muscle spasms, Postural dysfunction, Decreased strength, Decreased mobility, Decreased activity tolerance, Decreased range of motion  Visit Diagnosis: Cervicalgia  Abnormal posture     Problem List Patient Active Problem List   Diagnosis Date Noted  . Ankylosing spondylitis of cervicothoracic region ([place]) 10/16/2016  . TIA (transient ischemic attack) 08/10/2015  . Carotid stenosis 08/04/2014  . Hyperlipidemia 02/21/2012  . BENIGN PROSTATIC HYPERTROPHY, WITH OBSTRUCTION 01/15/2010  . Essential hypertension 11/10/2009  . GERD 02/22/2008  . NEPHROLITHIASIS, HX OF 11/17/2007    Baldo Hufnagle, [name] MPT 06/26/2020, 11:25 AM  [facility] [address] [place], [place], [address] Phone: [phone]   Fax:  [phone]  Name: [name] MRN: [id number] Date of Birth: 27-Jun-1936

## 2020-06-28 ENCOUNTER — Ambulatory Visit: Payer: Medicare Other | Admitting: Physical Therapy

## 2020-06-28 ENCOUNTER — Other Ambulatory Visit: Payer: Self-pay

## 2020-06-28 DIAGNOSIS — M542 Cervicalgia: Secondary | ICD-10-CM

## 2020-06-28 DIAGNOSIS — R293 Abnormal posture: Secondary | ICD-10-CM

## 2020-06-28 NOTE — Therapy (Signed)
[facility] [place], [place], [address] Phone: [phone]   Fax:  [phone]  Physical Therapy Treatment  Patient Details  Name: [name] MRN: [id number] Date of Birth: 01-Dec-1935 Referring Provider (PT): Rosita Kea PA-C.   Encounter Date: 06/28/2020   PT End of Session - 06/28/20 1154    Visit Number 9    Number of Visits 16    Date for PT Re-Evaluation 08/23/20    PT Start Time 1030    PT Stop Time 1125    PT Time Calculation (min) 55 min    Activity Tolerance Patient tolerated treatment well    Behavior During Therapy WFL for tasks assessed/performed           Past Medical History:  Diagnosis Date  . Ankylosing spondylitis ([place]) dx'd ~ 1974  . Arthritis    "back" (08/10/2015)  . BENIGN PROSTATIC HYPERTROPHY, WITH OBSTRUCTION 01/15/2010  . Bleeding duodenal ulcer   . Bleeding esophageal ulcer   . Bleeding stomach ulcer   . GERD 02/22/2008  . History of blood transfusion 1988   "lost ~ 1/2 of my blood volume from multiple bleeding ulcers"  . History of hiatal hernia   . HYPERGLYCEMIA 11/18/2007  . HYPERLIPIDEMIA 02/22/2008  . HYPERTENSION, UNSPECIFIED 11/10/2009  . Kidney stones   . Osteoarthritis    c-spine  . PEPTIC ULCER DISEASE 11/17/2007  . Situational depression    "son died in [place] 2015/07/13"  . TIA (transient ischemic attack)    "not that I know of in the past; they are trying to determine if I've had one today (08/10/2015)"    Past Surgical History:  Procedure Laterality Date  . CATARACT EXTRACTION W/ INTRAOCULAR LENS  IMPLANT, BILATERAL Bilateral 2013    There were no vitals filed for this visit.   Subjective Assessment - 06/28/20 1155    Subjective COVID-19 screen performed prior to patient entering clinic.  Doing well.    Pertinent History OA, HTN, Right ankle injury.    How long can you walk comfortably? Been walking about a mile recently.    Patient Stated Goals Decrease pain and  move better.    Currently in Pain? Yes    Pain Score --   Moderate.   Pain Location Neck    Pain Orientation Right;Left    Pain Descriptors / Indicators Sore    Pain Type Chronic pain    Pain Onset More than a month ago                             [facility] Adult PT Treatment/Exercise - 06/28/20 0001      Exercises   Exercises Shoulder;Knee/Hip      Knee/Hip Exercises: Aerobic   Nustep Level 5 x 10 minutes.      Shoulder Exercises: Standing   Other Standing Exercises Blue XTS bilateral "wood chops", seated lat PD, low and high elbow scapular retraction to fatigue for all exercises.      Shoulder Exercises: Pulleys   Flexion 5 minutes      Shoulder Exercises: ROM/Strengthening   UBE (Upper Arm Bike) 90 RPM's x 8 minutes.      Modalities   Modalities Electrical Stimulation;Moist Heat      Moist Heat Therapy   Number Minutes Moist Heat 20 Minutes    Moist Heat Location Cervical      Electrical Stimulation   Electrical Stimulation Location Bilateral cervical musculature.  Electrical Stimulation Action IFC    Electrical Stimulation Parameters 80-150 Hz x 20 minutes.    Electrical Stimulation Goals Tone;Pain                       PT Long Term Goals - 05/25/20 1258      PT LONG TERM GOAL #1   Title Patient will be independent with HEP    Time 8    Period Weeks    Status New      PT LONG TERM GOAL #2   Title Patient will demonstrate 60 degrees or greater of bilateral cervical rotation to improve ability to scan environment    Time 8    Period Weeks    Status New      PT LONG TERM GOAL #3   Title Patient will report ability to perform ADLs with cervical pain less than or equal to 3/10    Time 8    Period Weeks    Status New                 Plan - 06/28/20 1158    Clinical Impression Statement Patient doing very well with ther ex today.  Performed '[place]" today.    Personal Factors and Comorbidities Comorbidity  1;Comorbidity 2    Comorbidities OA, HTN, Right ankle injury.    Examination-Activity Limitations Locomotion Level;Other    Examination-Participation Restrictions Other    Stability/Clinical Decision Making Evolving/Moderate complexity    Rehab Potential Fair    PT Frequency 2x / week    PT Duration 8 weeks    PT Treatment/Interventions ADLs/Self Care Home Management;Cryotherapy;Electrical Stimulation;Ultrasound;Moist Heat;Therapeutic activities;Therapeutic exercise;Manual techniques;Patient/family education;Passive range of motion;Dry needling    PT Next Visit Plan UBE, Nustep, pulleys, generalized U and LE ther ex, modalities as need to neck and upper back.    Consulted and Agree with Plan of Care Patient           Patient will benefit from skilled therapeutic intervention in order to improve the following deficits and impairments:  Pain, Abnormal gait, Increased muscle spasms, Postural dysfunction, Decreased strength, Decreased mobility, Decreased activity tolerance, Decreased range of motion  Visit Diagnosis: Cervicalgia  Abnormal posture     Problem List Patient Active Problem List   Diagnosis Date Noted  . Ankylosing spondylitis of cervicothoracic region ([place]) 10/16/2016  . TIA (transient ischemic attack) 08/10/2015  . Carotid stenosis 08/04/2014  . Hyperlipidemia 02/21/2012  . BENIGN PROSTATIC HYPERTROPHY, WITH OBSTRUCTION 01/15/2010  . Essential hypertension 11/10/2009  . GERD 02/22/2008  . NEPHROLITHIASIS, HX OF 11/17/2007    Bayler Nehring, [name] MPT 06/28/2020, 12:00 PM  [facility] [address] [place], [place], [address] Phone: [phone]   Fax:  [phone]  Name: [name] MRN: [id number] Date of Birth: 1936/05/28

## 2020-07-03 ENCOUNTER — Ambulatory Visit: Payer: Medicare Other | Admitting: Physical Therapy

## 2020-07-03 ENCOUNTER — Other Ambulatory Visit: Payer: Self-pay

## 2020-07-03 DIAGNOSIS — R293 Abnormal posture: Secondary | ICD-10-CM

## 2020-07-03 DIAGNOSIS — M542 Cervicalgia: Secondary | ICD-10-CM | POA: Diagnosis not present

## 2020-07-03 NOTE — Therapy (Signed)
[facility] [place], [place], [address] Phone: [phone]   Fax:  [phone]  Physical Therapy Treatment  Patient Details  Name: [name] MRN: [id number] Date of Birth: 06-Jan-1936 Referring Provider (PT): Rosita Kea PA-C.   Encounter Date: 07/03/2020   PT End of Session - 07/03/20 1112    Visit Number 10    Number of Visits 16    Date for PT Re-Evaluation 08/23/20    PT Start Time 1030    PT Stop Time 1126    PT Time Calculation (min) 56 min    Activity Tolerance Patient tolerated treatment well    Behavior During Therapy WFL for tasks assessed/performed           Past Medical History:  Diagnosis Date  . Ankylosing spondylitis ([place]) dx'd ~ 1974  . Arthritis    "back" (08/10/2015)  . BENIGN PROSTATIC HYPERTROPHY, WITH OBSTRUCTION 01/15/2010  . Bleeding duodenal ulcer   . Bleeding esophageal ulcer   . Bleeding stomach ulcer   . GERD 02/22/2008  . History of blood transfusion 1988   "lost ~ 1/2 of my blood volume from multiple bleeding ulcers"  . History of hiatal hernia   . HYPERGLYCEMIA 11/18/2007  . HYPERLIPIDEMIA 02/22/2008  . HYPERTENSION, UNSPECIFIED 11/10/2009  . Kidney stones   . Osteoarthritis    c-spine  . PEPTIC ULCER DISEASE 11/17/2007  . Situational depression    "son died in [place] June 18, 2015"  . TIA (transient ischemic attack)    "not that I know of in the past; they are trying to determine if I've had one today (08/10/2015)"    Past Surgical History:  Procedure Laterality Date  . CATARACT EXTRACTION W/ INTRAOCULAR LENS  IMPLANT, BILATERAL Bilateral 2013    There were no vitals filed for this visit.   Subjective Assessment - 07/03/20 1037    Subjective COVID-19 screen performed prior to patient entering clinic.  No new complaints.    Pertinent History OA, HTN, Right ankle injury.    How long can you walk comfortably? Been walking about a mile recently.    Currently in Pain? Yes    Pain  Score --   Moderate.   Pain Location Neck    Pain Orientation Right;Left    Pain Descriptors / Indicators Sore    Pain Onset More than a month ago                             [facility] Adult PT Treatment/Exercise - 07/03/20 0001      Exercises   Exercises Shoulder;Knee/Hip      Knee/Hip Exercises: Aerobic   Nustep Level 5 x 10 minutes.      Shoulder Exercises: Seated   Other Seated Exercises Lat PD with 20#, chest press with 20# and scap retraction with 20# on Appollo machine to fatigue.      Shoulder Exercises: Pulleys   Flexion 5 minutes      Shoulder Exercises: ROM/Strengthening   UBE (Upper Arm Bike) 90 RPM's x 10 minutes.      Modalities   Modalities Electrical Stimulation;Moist Heat      Moist Heat Therapy   Number Minutes Moist Heat 20 Minutes    Moist Heat Location Cervical      Electrical Stimulation   Electrical Stimulation Location Bil Cervical    Electrical Stimulation Action IFC    Electrical Stimulation Parameters 80-150 Hz x 20 minutes on  100% scan.    Electrical Stimulation Goals Tone;Pain                       PT Long Term Goals - 05/25/20 1258      PT LONG TERM GOAL #1   Title Patient will be independent with HEP    Time 8    Period Weeks    Status New      PT LONG TERM GOAL #2   Title Patient will demonstrate 60 degrees or greater of bilateral cervical rotation to improve ability to scan environment    Time 8    Period Weeks    Status New      PT LONG TERM GOAL #3   Title Patient will report ability to perform ADLs with cervical pain less than or equal to 3/10    Time 8    Period Weeks    Status New                 Plan - 07/03/20 1110    Clinical Impression Statement patient is very motivated and he did a great job on resisted weight exercises with excellent technique and no complaint.    Personal Factors and Comorbidities Comorbidity 1;Comorbidity 2    Comorbidities OA, HTN, Right ankle injury.     Examination-Activity Limitations Locomotion Level;Other    Examination-Participation Restrictions Other    Stability/Clinical Decision Making Evolving/Moderate complexity    Rehab Potential Fair    PT Frequency 2x / week    PT Duration 8 weeks    PT Treatment/Interventions ADLs/Self Care Home Management;Cryotherapy;Electrical Stimulation;Ultrasound;Moist Heat;Therapeutic activities;Therapeutic exercise;Manual techniques;Patient/family education;Passive range of motion;Dry needling    PT Next Visit Plan UBE, Nustep, pulleys, generalized U and LE ther ex, modalities as need to neck and upper back.    Consulted and Agree with Plan of Care Patient           Patient will benefit from skilled therapeutic intervention in order to improve the following deficits and impairments:  Pain, Abnormal gait, Increased muscle spasms, Postural dysfunction, Decreased strength, Decreased mobility, Decreased activity tolerance, Decreased range of motion  Visit Diagnosis: Cervicalgia  Abnormal posture     Problem List Patient Active Problem List   Diagnosis Date Noted  . Ankylosing spondylitis of cervicothoracic region ([place]) 10/16/2016  . TIA (transient ischemic attack) 08/10/2015  . Carotid stenosis 08/04/2014  . Hyperlipidemia 02/21/2012  . BENIGN PROSTATIC HYPERTROPHY, WITH OBSTRUCTION 01/15/2010  . Essential hypertension 11/10/2009  . GERD 02/22/2008  . NEPHROLITHIASIS, HX OF 11/17/2007   Progress Note Reporting Period 05/25/20 to 07/03/20  See note below for Objective Data and Assessment of Progress/Goals. Patient progressing with there ex and able to perform low resistance weight machine exercises today with excellent technique and no complaints.     [name], [name] MPT 07/03/2020, 11:57 AM  [facility] [address] [place], [place], [address] Phone: [phone]   Fax:  [phone]  Name: [name] MRN: [id number] Date of Birth:  [birth year]

## 2020-07-05 ENCOUNTER — Other Ambulatory Visit: Payer: Self-pay

## 2020-07-05 ENCOUNTER — Ambulatory Visit: Payer: Medicare Other | Admitting: Physical Therapy

## 2020-07-05 DIAGNOSIS — M542 Cervicalgia: Secondary | ICD-10-CM

## 2020-07-05 DIAGNOSIS — R293 Abnormal posture: Secondary | ICD-10-CM | POA: Diagnosis not present

## 2020-07-05 NOTE — Therapy (Signed)
[facility] [place], [place], [address] Phone: [phone]   Fax:  [phone]  Physical Therapy Treatment  Patient Details  Name: [name] MRN: [id number] Date of Birth: 09-04-1935 Referring Provider (PT): Rosita Kea PA-C.   Encounter Date: 07/05/2020   PT End of Session - 07/05/20 1221    Visit Number 11    Number of Visits 16    Date for PT Re-Evaluation 08/23/20    PT Start Time 1115    PT Stop Time 1212    PT Time Calculation (min) 57 min    Activity Tolerance Patient tolerated treatment well    Behavior During Therapy [facility] for tasks assessed/performed           Past Medical History:  Diagnosis Date  . Ankylosing spondylitis ([place]) dx'd ~ 1974  . Arthritis    "back" (08/10/2015)  . BENIGN PROSTATIC HYPERTROPHY, WITH OBSTRUCTION 01/15/2010  . Bleeding duodenal ulcer   . Bleeding esophageal ulcer   . Bleeding stomach ulcer   . GERD 02/22/2008  . History of blood transfusion 1988   "lost ~ 1/2 of my blood volume from multiple bleeding ulcers"  . History of hiatal hernia   . HYPERGLYCEMIA 11/18/2007  . HYPERLIPIDEMIA 02/22/2008  . HYPERTENSION, UNSPECIFIED 11/10/2009  . Kidney stones   . Osteoarthritis    c-spine  . PEPTIC ULCER DISEASE 11/17/2007  . Situational depression    "son died in [place] July 14, 2015"  . TIA (transient ischemic attack)    "not that I know of in the past; they are trying to determine if I've had one today (08/10/2015)"    Past Surgical History:  Procedure Laterality Date  . CATARACT EXTRACTION W/ INTRAOCULAR LENS  IMPLANT, BILATERAL Bilateral 2013    There were no vitals filed for this visit.   Subjective Assessment - 07/05/20 1222    Subjective COVID-19 screen performed prior to patient entering clinic.  Neck pain about a 3/10.    Pertinent History OA, HTN, Right ankle injury.    How long can you walk comfortably? Been walking about a mile recently.    Patient Stated Goals  Decrease pain and move better.    Currently in Pain? Yes    Pain Score 3     Pain Location Neck    Pain Orientation Right;Left    Pain Descriptors / Indicators Sore    Pain Type Chronic pain    Pain Onset More than a month ago                             [facility] Adult PT Treatment/Exercise - 07/05/20 0001      Exercises   Exercises Shoulder;Knee/Hip      Knee/Hip Exercises: Aerobic   Nustep Level 5 x 10 minutes.      Shoulder Exercises: Seated   Other Seated Exercises Lat PD with 20#, chest press, scap retraction and then low elbow scap retraction on cable system with 20#      Shoulder Exercises: Pulleys   Flexion 5 minutes      Shoulder Exercises: ROM/Strengthening   UBE (Upper Arm Bike) 90 RPM's x 10 minutes.      Modalities   Modalities Electrical Stimulation;Moist Heat      Moist Heat Therapy   Number Minutes Moist Heat 20 Minutes    Moist Heat Location Cervical      Electrical Stimulation   Electrical Stimulation Location Bil  cervical.    Electrical Stimulation Action IFC    Electrical Stimulation Parameters 80-150 Hz x 20 minutes.    Electrical Stimulation Goals Tone;Pain                       PT Long Term Goals - 05/25/20 1258      PT LONG TERM GOAL #1   Title Patient will be independent with HEP    Time 8    Period Weeks    Status New      PT LONG TERM GOAL #2   Title Patient will demonstrate 60 degrees or greater of bilateral cervical rotation to improve ability to scan environment    Time 8    Period Weeks    Status New      PT LONG TERM GOAL #3   Title Patient will report ability to perform ADLs with cervical pain less than or equal to 3/10    Time 8    Period Weeks    Status New                 Plan - 07/05/20 1222    Clinical Impression Statement Patient continues to do very well.  Added cable system exercise today which is tolerated without complaint.    Personal Factors and Comorbidities Comorbidity  1;Comorbidity 2    Comorbidities OA, HTN, Right ankle injury.    Examination-Activity Limitations Locomotion Level;Other    Examination-Participation Restrictions Other    Stability/Clinical Decision Making Evolving/Moderate complexity    Rehab Potential Fair    PT Frequency 2x / week    PT Duration 8 weeks    PT Treatment/Interventions ADLs/Self Care Home Management;Cryotherapy;Electrical Stimulation;Ultrasound;Moist Heat;Therapeutic activities;Therapeutic exercise;Manual techniques;Patient/family education;Passive range of motion;Dry needling    PT Next Visit Plan UBE, Nustep, pulleys, generalized U and LE ther ex, modalities as need to neck and upper back.    Consulted and Agree with Plan of Care Patient           Patient will benefit from skilled therapeutic intervention in order to improve the following deficits and impairments:  Pain, Abnormal gait, Increased muscle spasms, Postural dysfunction, Decreased strength, Decreased mobility, Decreased activity tolerance, Decreased range of motion  Visit Diagnosis: Cervicalgia  Abnormal posture     Problem List Patient Active Problem List   Diagnosis Date Noted  . Ankylosing spondylitis of cervicothoracic region ([place]) 10/16/2016  . TIA (transient ischemic attack) 08/10/2015  . Carotid stenosis 08/04/2014  . Hyperlipidemia 02/21/2012  . BENIGN PROSTATIC HYPERTROPHY, WITH OBSTRUCTION 01/15/2010  . Essential hypertension 11/10/2009  . GERD 02/22/2008  . NEPHROLITHIASIS, HX OF 11/17/2007    Hafsah Hendler, [name] MPT 07/05/2020, 12:25 PM  [facility] [address] [place], [place], [address] Phone: [phone]   Fax:  [phone]  Name: [name] MRN: [id number] Date of Birth: 04/26/1936

## 2020-07-10 ENCOUNTER — Other Ambulatory Visit: Payer: Self-pay

## 2020-07-10 ENCOUNTER — Ambulatory Visit: Payer: Medicare Other | Admitting: Physical Therapy

## 2020-07-10 DIAGNOSIS — R293 Abnormal posture: Secondary | ICD-10-CM

## 2020-07-10 DIAGNOSIS — M542 Cervicalgia: Secondary | ICD-10-CM | POA: Diagnosis not present

## 2020-07-10 NOTE — Therapy (Signed)
[facility] [place], [place], [address] Phone: [phone]   Fax:  [phone]  Physical Therapy Treatment  Patient Details  Name: [name] MRN: [id number] Date of Birth: Jan 21, 1936 Referring Provider (PT): Rosita Kea PA-C.   Encounter Date: 07/10/2020   PT End of Session - 07/10/20 1128    Visit Number 12    Number of Visits 16    Date for PT Re-Evaluation 08/23/20    PT Start Time 1030    PT Stop Time 1126    PT Time Calculation (min) 56 min    Activity Tolerance Patient tolerated treatment well    Behavior During Therapy WFL for tasks assessed/performed           Past Medical History:  Diagnosis Date  . Ankylosing spondylitis ([place]) dx'd ~ 1974  . Arthritis    "back" (08/10/2015)  . BENIGN PROSTATIC HYPERTROPHY, WITH OBSTRUCTION 01/15/2010  . Bleeding duodenal ulcer   . Bleeding esophageal ulcer   . Bleeding stomach ulcer   . GERD 02/22/2008  . History of blood transfusion 1988   "lost ~ 1/2 of my blood volume from multiple bleeding ulcers"  . History of hiatal hernia   . HYPERGLYCEMIA 11/18/2007  . HYPERLIPIDEMIA 02/22/2008  . HYPERTENSION, UNSPECIFIED 11/10/2009  . Kidney stones   . Osteoarthritis    c-spine  . PEPTIC ULCER DISEASE 11/17/2007  . Situational depression    "son died in [place] June 30, 2015"  . TIA (transient ischemic attack)    "not that I know of in the past; they are trying to determine if I've had one today (08/10/2015)"    Past Surgical History:  Procedure Laterality Date  . CATARACT EXTRACTION W/ INTRAOCULAR LENS  IMPLANT, BILATERAL Bilateral 2013    There were no vitals filed for this visit.   Subjective Assessment - 07/10/20 1035    Subjective COVID-19 screen performed prior to patient entering clinic.  No new complaints.    Pertinent History OA, HTN, Right ankle injury.    How long can you walk comfortably? Been walking about a mile recently.    Patient Stated Goals Decrease  pain and move better.    Currently in Pain? Yes    Pain Score 3     Pain Location Neck    Pain Orientation Right;Left    Pain Descriptors / Indicators Sore    Pain Type Chronic pain    Pain Onset More than a month ago                             [facility] Adult PT Treatment/Exercise - 07/10/20 0001      Exercises   Exercises Shoulder;Knee/Hip      Knee/Hip Exercises: Aerobic   Nustep Level 5 x 10 minutes.      Shoulder Exercises: Seated   Other Seated Exercises Lat PD with 20# and scap retraction with 20#.      Shoulder Exercises: Pulleys   Flexion 5 minutes      Shoulder Exercises: ROM/Strengthening   UBE (Upper Arm Bike) 90 RPM's x 10 minutes.      Modalities   Modalities Electrical Stimulation;Moist Heat      Moist Heat Therapy   Number Minutes Moist Heat 20 Minutes    Moist Heat Location Cervical      Electrical Stimulation   Electrical Stimulation Location Bil cervical    Electrical Stimulation Action IFC on 40% scan  Electrical Stimulation Parameters 80-150 Hz x 20 minutes.    Electrical Stimulation Goals Tone;Pain                       PT Long Term Goals - 07/10/20 1057      PT LONG TERM GOAL #1   Title Patient will be independent with HEP    Time 8    Period Weeks    Status Achieved      PT LONG TERM GOAL #3   Title Patient will report ability to perform ADLs with cervical pain less than or equal to 3/10    Time 8    Period Weeks    Status Achieved                 Plan - 07/10/20 1125    Clinical Impression Statement Patient's active cervical rotation measured at 53 degrees today.    Personal Factors and Comorbidities Comorbidity 1;Comorbidity 2    Comorbidities OA, HTN, Right ankle injury.    Examination-Activity Limitations Locomotion Level;Other    Examination-Participation Restrictions Other    Stability/Clinical Decision Making Evolving/Moderate complexity    Rehab Potential Fair    PT Frequency 2x /  week    PT Duration 8 weeks    PT Treatment/Interventions ADLs/Self Care Home Management;Cryotherapy;Electrical Stimulation;Ultrasound;Moist Heat;Therapeutic activities;Therapeutic exercise;Manual techniques;Patient/family education;Passive range of motion;Dry needling    PT Next Visit Plan UBE, Nustep, pulleys, generalized U and LE ther ex, modalities as need to neck and upper back.           Patient will benefit from skilled therapeutic intervention in order to improve the following deficits and impairments:  Pain, Abnormal gait, Increased muscle spasms, Postural dysfunction, Decreased strength, Decreased mobility, Decreased activity tolerance, Decreased range of motion  Visit Diagnosis: Cervicalgia  Abnormal posture     Problem List Patient Active Problem List   Diagnosis Date Noted  . Ankylosing spondylitis of cervicothoracic region ([place]) 10/16/2016  . TIA (transient ischemic attack) 08/10/2015  . Carotid stenosis 08/04/2014  . Hyperlipidemia 02/21/2012  . BENIGN PROSTATIC HYPERTROPHY, WITH OBSTRUCTION 01/15/2010  . Essential hypertension 11/10/2009  . GERD 02/22/2008  . NEPHROLITHIASIS, HX OF 11/17/2007    Bexley Laubach, [name] MPT 07/10/2020, 11:30 AM  [facility] [address] [place], [place], [address] Phone: [phone]   Fax:  [phone]  Name: [name] MRN: [id number] Date of Birth: May 22, 1936

## 2020-07-11 DIAGNOSIS — Z23 Encounter for immunization: Secondary | ICD-10-CM | POA: Diagnosis not present

## 2020-07-12 ENCOUNTER — Other Ambulatory Visit: Payer: Self-pay

## 2020-07-12 ENCOUNTER — Ambulatory Visit: Payer: Medicare Other | Admitting: Physical Therapy

## 2020-07-12 DIAGNOSIS — R293 Abnormal posture: Secondary | ICD-10-CM | POA: Diagnosis not present

## 2020-07-12 DIAGNOSIS — M542 Cervicalgia: Secondary | ICD-10-CM

## 2020-07-12 NOTE — Therapy (Signed)
[facility] [place], [place], [address] Phone: [phone]   Fax:  [phone]  Physical Therapy Treatment  Patient Details  Name: [name] MRN: [id number] Date of Birth: December 18, 1935 Referring Provider (PT): Rosita Kea PA-C.   Encounter Date: 07/12/2020   PT End of Session - 07/12/20 1114    Visit Number 13    Number of Visits 16    Date for PT Re-Evaluation 08/23/20    PT Start Time 1030    PT Stop Time 1127    PT Time Calculation (min) 57 min    Activity Tolerance Patient tolerated treatment well    Behavior During Therapy WFL for tasks assessed/performed           Past Medical History:  Diagnosis Date  . Ankylosing spondylitis ([place]) dx'd ~ 1974  . Arthritis    "back" (08/10/2015)  . BENIGN PROSTATIC HYPERTROPHY, WITH OBSTRUCTION 01/15/2010  . Bleeding duodenal ulcer   . Bleeding esophageal ulcer   . Bleeding stomach ulcer   . GERD 02/22/2008  . History of blood transfusion 1988   "lost ~ 1/2 of my blood volume from multiple bleeding ulcers"  . History of hiatal hernia   . HYPERGLYCEMIA 11/18/2007  . HYPERLIPIDEMIA 02/22/2008  . HYPERTENSION, UNSPECIFIED 11/10/2009  . Kidney stones   . Osteoarthritis    c-spine  . PEPTIC ULCER DISEASE 11/17/2007  . Situational depression    "son died in [place] July 08, 2015"  . TIA (transient ischemic attack)    "not that I know of in the past; they are trying to determine if I've had one today (08/10/2015)"    Past Surgical History:  Procedure Laterality Date  . CATARACT EXTRACTION W/ INTRAOCULAR LENS  IMPLANT, BILATERAL Bilateral 2013    There were no vitals filed for this visit.   Subjective Assessment - 07/12/20 1113    Subjective COVID-19 screen performed prior to patient entering clinic.  No new complaints.    Pertinent History OA, HTN, Right ankle injury.    How long can you walk comfortably? Been walking about a mile recently.    Patient Stated Goals Decrease  pain and move better.    Pain Score 3     Pain Location Neck    Pain Orientation Right;Left    Pain Descriptors / Indicators Sore    Pain Type Chronic pain    Pain Onset More than a month ago    Pain Frequency Constant                             [facility] Adult PT Treatment/Exercise - 07/12/20 0001      Exercises   Exercises Shoulder;Knee/Hip      Knee/Hip Exercises: Aerobic   Nustep Level 5 x 10 minutes.      Shoulder Exercises: Seated   Other Seated Exercises Lat PD, scap retraction and chest press with 20# to fatigue.      Shoulder Exercises: Pulleys   Flexion --   4 minutes.     Shoulder Exercises: ROM/Strengthening   UBE (Upper Arm Bike) 90 RPM's x 10 minutes.      Modalities   Modalities Electrical Stimulation      Moist Heat Therapy   Number Minutes Moist Heat 20 Minutes    Moist Heat Location Cervical      Electrical Stimulation   Electrical Stimulation Location Bil Cervical musculature.    Electrical Stimulation Action IFC at  100% scan x 20 minutes.    Electrical Stimulation Goals Tone;Pain                       PT Long Term Goals - 07/10/20 1057      PT LONG TERM GOAL #1   Title Patient will be independent with HEP    Time 8    Period Weeks    Status Achieved      PT LONG TERM GOAL #3   Title Patient will report ability to perform ADLs with cervical pain less than or equal to 3/10    Time 8    Period Weeks    Status Achieved                  Patient will benefit from skilled therapeutic intervention in order to improve the following deficits and impairments:     Visit Diagnosis: Cervicalgia  Abnormal posture     Problem List Patient Active Problem List   Diagnosis Date Noted  . Ankylosing spondylitis of cervicothoracic region ([place]) 10/16/2016  . TIA (transient ischemic attack) 08/10/2015  . Carotid stenosis 08/04/2014  . Hyperlipidemia 02/21/2012  . BENIGN PROSTATIC HYPERTROPHY, WITH OBSTRUCTION  01/15/2010  . Essential hypertension 11/10/2009  . GERD 02/22/2008  . NEPHROLITHIASIS, HX OF 11/17/2007    Derricka Mertz, [name] MPT 07/12/2020, 12:44 PM  [facility] [address] [place], [place], [address] Phone: [phone]   Fax:  [phone]  Name: [name] MRN: [id number] Date of Birth: 1936/04/23

## 2020-07-17 ENCOUNTER — Other Ambulatory Visit: Payer: Self-pay

## 2020-07-17 ENCOUNTER — Ambulatory Visit: Payer: Medicare Other | Admitting: Physical Therapy

## 2020-07-17 DIAGNOSIS — R293 Abnormal posture: Secondary | ICD-10-CM | POA: Diagnosis not present

## 2020-07-17 DIAGNOSIS — M542 Cervicalgia: Secondary | ICD-10-CM

## 2020-07-17 NOTE — Therapy (Signed)
[facility] [place], [place], [address] Phone: [phone]   Fax:  [phone]  Physical Therapy Treatment  Patient Details  Name: [name] MRN: [id number] Date of Birth: 05-Nov-1935 Referring Provider (PT): Rosita Kea PA-C.   Encounter Date: 07/17/2020   PT End of Session - 07/17/20 0955    Visit Number 14    Number of Visits 16    Date for PT Re-Evaluation 08/23/20    PT Start Time 0945    PT Stop Time 6270    PT Time Calculation (min) 62 min    Activity Tolerance Patient tolerated treatment well    Behavior During Therapy [facility] for tasks assessed/performed           Past Medical History:  Diagnosis Date  . Ankylosing spondylitis ([place]) dx'd ~ 1974  . Arthritis    "back" (08/10/2015)  . BENIGN PROSTATIC HYPERTROPHY, WITH OBSTRUCTION 01/15/2010  . Bleeding duodenal ulcer   . Bleeding esophageal ulcer   . Bleeding stomach ulcer   . GERD 02/22/2008  . History of blood transfusion 1988   "lost ~ 1/2 of my blood volume from multiple bleeding ulcers"  . History of hiatal hernia   . HYPERGLYCEMIA 11/18/2007  . HYPERLIPIDEMIA 02/22/2008  . HYPERTENSION, UNSPECIFIED 11/10/2009  . Kidney stones   . Osteoarthritis    c-spine  . PEPTIC ULCER DISEASE 11/17/2007  . Situational depression    "son died in [place] 2015/07/16"  . TIA (transient ischemic attack)    "not that I know of in the past; they are trying to determine if I've had one today (08/10/2015)"    Past Surgical History:  Procedure Laterality Date  . CATARACT EXTRACTION W/ INTRAOCULAR LENS  IMPLANT, BILATERAL Bilateral 2013    There were no vitals filed for this visit.   Subjective Assessment - 07/17/20 0946    Subjective COVID-19 screen performed prior to patient entering clinic.  Patient tolerated treatment well last treatment and doing well today    Pertinent History OA, HTN, Right ankle injury.    How long can you walk comfortably? Been walking about a  mile recently.    Patient Stated Goals Decrease pain and move better.    Currently in Pain? Yes    Pain Score 3     Pain Location Neck    Pain Orientation Right;Left    Pain Descriptors / Indicators Sore;Discomfort    Pain Type Chronic pain    Pain Onset More than a month ago    Pain Frequency Constant    Aggravating Factors  certain movements    Pain Relieving Factors rest and heat              [facility] PT Assessment - 07/17/20 0001      ROM / Strength   AROM / PROM / Strength AROM      AROM   Overall AROM  Deficits    AROM Assessment Site Cervical    Cervical - Right Rotation 48    Cervical - Left Rotation 46                         [facility] Adult PT Treatment/Exercise - 07/17/20 0001      Knee/Hip Exercises: Aerobic   Nustep Level 5 x 11 1/2 min with 20-30SPM posture focus      Shoulder Exercises: Seated   Other Seated Exercises Lat PD, scap retraction and chest press with 20# to  fatigue.    Other Seated Exercises seated scap retraction with chin tuck using grey round boulster for tactiile cues 3x10      Shoulder Exercises: Pulleys   Flexion 3 minutes;1 minute   slow and controlled     Shoulder Exercises: ROM/Strengthening   UBE (Upper Arm Bike) 90 RPM's x 10 min posture focus      Moist Heat Therapy   Number Minutes Moist Heat 20 Minutes    Moist Heat Location Cervical      Electrical Stimulation   Electrical Stimulation Location Bil Cervical musculature.    Electrical Stimulation Action IFC    Electrical Stimulation Parameters 80-150hz  x15min    Electrical Stimulation Goals Tone;Pain                       PT Long Term Goals - 07/17/20 0955      PT LONG TERM GOAL #1   Title Patient will be independent with HEP    Time 8    Period Weeks    Status Achieved      PT LONG TERM GOAL #2   Title Patient will demonstrate 60 degrees or greater of bilateral cervical rotation to improve ability to scan environment    Baseline 48RT/46LT  07/17/20    Time 8    Period Weeks    Status On-going      PT LONG TERM GOAL #3   Title Patient will report ability to perform ADLs with cervical pain less than or equal to 3/10    Time 8    Period Weeks    Status Achieved                 Plan - 07/17/20 1018    Clinical Impression Statement Patient tolerated treatment well today. Patient has ongoing discomfort in cervical spine and some increased tightness with cervical rotation today. Patient continues to do well with all exercises and has reported doing HEP a few times a week. Patient current goals ongoing.    Personal Factors and Comorbidities Comorbidity 1;Comorbidity 2    Comorbidities OA, HTN, Right ankle injury.    Examination-Activity Limitations Locomotion Level;Other    Examination-Participation Restrictions Other    Stability/Clinical Decision Making Evolving/Moderate complexity    Rehab Potential Fair    PT Frequency 2x / week    PT Duration 8 weeks    PT Treatment/Interventions ADLs/Self Care Home Management;Cryotherapy;Electrical Stimulation;Ultrasound;Moist Heat;Therapeutic activities;Therapeutic exercise;Manual techniques;Patient/family education;Passive range of motion;Dry needling    PT Next Visit Plan cont with POC with generalized U and LE ther ex, modalities as need to neck and upper back.    Consulted and Agree with Plan of Care Patient           Patient will benefit from skilled therapeutic intervention in order to improve the following deficits and impairments:  Pain, Abnormal gait, Increased muscle spasms, Postural dysfunction, Decreased strength, Decreased mobility, Decreased activity tolerance, Decreased range of motion  Visit Diagnosis: Cervicalgia  Abnormal posture     Problem List Patient Active Problem List   Diagnosis Date Noted  . Ankylosing spondylitis of cervicothoracic region ([place]) 10/16/2016  . TIA (transient ischemic attack) 08/10/2015  . Carotid stenosis 08/04/2014  .  Hyperlipidemia 02/21/2012  . BENIGN PROSTATIC HYPERTROPHY, WITH OBSTRUCTION 01/15/2010  . Essential hypertension 11/10/2009  . GERD 02/22/2008  . NEPHROLITHIASIS, HX OF 11/17/2007    DUNFORD, CHRISTINA P, PTA 07/17/2020, 10:55 AM  [facility] [place]  [place], [place], [address] Phone: [phone]   Fax:  [phone]  Name: [name] MRN: [id number] Date of Birth: 09-Jun-1936

## 2020-07-19 ENCOUNTER — Telehealth: Payer: Self-pay | Admitting: Neurology

## 2020-07-19 NOTE — Telephone Encounter (Signed)
Received this notice from [employer] manager: "Currently we do not have any Reamed. To the beat of my knowledge there will be some coming in but l don't know the exact date. I will tell you if you find any company that has [place] l will be shocked. Most all companies on have alternative paps to [place]. When Resmed machines come in he will be scheduled right away. Thats all l can do sorry."  I called [facility]. They are booking about 2 weeks out for ResMed machines and have adequate inventory. They will be on the lookout for this pt if he decides to switch to them and get him set up asap. He should ask to speak to [facility]  I called pt and discussed this with him. He would like to use [facility]. I will send the referral to [facility]. He will follow up with [facility] next week and I gave him their phone number. Pt verbalized understanding.

## 2020-07-19 NOTE — Telephone Encounter (Signed)
I called pt. He had an appt with [facility] to get his cpap but it was cancelled because they don't have ResMeds. He not interested in the [facility] because it is does not have wireless capability and he lives [place] hour away. I offered to switch DMEs for him because there may be a DME that can get him set up sooner but for now pt declined. He wanted to make sure Dr. Rexene Alberts is aware of the delay in the start of his cpap. I advised him that his is an issue for nearly all of our [facility] patients waiting for cpap since there is a Producer, television/film/video. There is not anything our office can do change the supply of the cpaps. I will reach out to [facility]'s manager to see if there is anything they can do on their end but it may be worth reaching out to another DME to see if they can get a cpap sooner. I will let the pt know what I hear back. Pt verbalized understanding.

## 2020-07-19 NOTE — Telephone Encounter (Signed)
Pt. requests a call from Dr. for advice on appropriate CPAP machine. Please advise.

## 2020-07-24 ENCOUNTER — Ambulatory Visit: Payer: Medicare Other | Admitting: Physical Therapy

## 2020-07-26 NOTE — Telephone Encounter (Signed)
I called pt. He heard from [facility] to get his cpap set up but he does not have an appointment yet but they are working with insurance auth now. The appt for 12/29 was cancelled and we will keep the appt for 10/18/20 with Dr. Rexene Alberts. Pt will let me know if he has questions or concerns.

## 2020-07-31 ENCOUNTER — Other Ambulatory Visit: Payer: Self-pay

## 2020-07-31 ENCOUNTER — Ambulatory Visit: Payer: Medicare Other | Attending: Physician Assistant | Admitting: Physical Therapy

## 2020-07-31 DIAGNOSIS — R293 Abnormal posture: Secondary | ICD-10-CM | POA: Insufficient documentation

## 2020-07-31 DIAGNOSIS — M542 Cervicalgia: Secondary | ICD-10-CM | POA: Insufficient documentation

## 2020-07-31 NOTE — Therapy (Addendum)
[facility] [place], [place], [address] Phone: [phone]   Fax:  [phone]  Physical Therapy Treatment  Patient Details  Name: [name] MRN: [id number] Date of Birth: 30-Nov-1935 Referring Provider (PT): Rosita Kea PA-C.   Encounter Date: 07/31/2020   PT End of Session - 07/31/20 1220     Visit Number 15    Number of Visits 16    Date for PT Re-Evaluation 08/23/20    PT Start Time 1030    PT Stop Time 1127    PT Time Calculation (min) 57 min    Activity Tolerance Patient tolerated treatment well    Behavior During Therapy WFL for tasks assessed/performed             Past Medical History:  Diagnosis Date   Ankylosing spondylitis ([place]) dx'd ~ 1974   Arthritis    "back" (08/10/2015)   BENIGN PROSTATIC HYPERTROPHY, WITH OBSTRUCTION 01/15/2010   Bleeding duodenal ulcer    Bleeding esophageal ulcer    Bleeding stomach ulcer    GERD 02/22/2008   History of blood transfusion 1988   "lost ~ 1/2 of my blood volume from multiple bleeding ulcers"   History of hiatal hernia    HYPERGLYCEMIA 11/18/2007   HYPERLIPIDEMIA 02/22/2008   HYPERTENSION, UNSPECIFIED 11/10/2009   Kidney stones    Osteoarthritis    c-spine   PEPTIC ULCER DISEASE 11/17/2007   Situational depression    "son died in [place] 05/30/2015"   TIA (transient ischemic attack)    "not that I know of in the past; they are trying to determine if I've had one today (08/10/2015)"    Past Surgical History:  Procedure Laterality Date   CATARACT EXTRACTION W/ INTRAOCULAR LENS  IMPLANT, BILATERAL Bilateral 2013    There were no vitals filed for this visit.   Subjective Assessment - 07/31/20 1111     Subjective COVID-19 screen performed prior to patient entering clinic.  Pain staying around a 3.    Pertinent History OA, HTN, Right ankle injury.    How long can you walk comfortably? Been walking about a mile recently.    Patient Stated Goals Decrease pain  and move better.    Currently in Pain? Yes    Pain Score 3     Pain Location Neck    Pain Orientation Right;Left    Pain Descriptors / Indicators Sore;Discomfort    Pain Type Chronic pain    Pain Onset More than a month ago    Pain Frequency Constant                               [facility] Adult PT Treatment/Exercise - 07/31/20 0001       Exercises   Exercises Shoulder;Knee/Hip      Knee/Hip Exercises: Aerobic   Nustep Level 5 x 10 minutes.      Shoulder Exercises: Seated   Other Seated Exercises Lat PD, Chest press and scap retraction with 20# to fatigue.      Shoulder Exercises: Pulleys   Flexion 5 minutes      Shoulder Exercises: ROM/Strengthening   UBE (Upper Arm Bike) 90 RPM's x 8 minutes.      Modalities   Modalities Electrical Stimulation;Moist Heat      Moist Heat Therapy   Number Minutes Moist Heat 20 Minutes    Moist Heat Location Cervical      Electrical Stimulation  Electrical Stimulation Location Bil C-spine musculature.    Electrical Stimulation Action IFC    Electrical Stimulation Parameters 80-150 Hz x 20 minutes at 40% scan.    Electrical Stimulation Goals Tone;Pain                         PT Long Term Goals - 07/17/20 0955       PT LONG TERM GOAL #1   Title Patient will be independent with HEP    Time 8    Period Weeks    Status Achieved      PT LONG TERM GOAL #2   Title Patient will demonstrate 60 degrees or greater of bilateral cervical rotation to improve ability to scan environment    Baseline 48RT/46LT 07/17/20    Time 8    Period Weeks    Status On-going      PT LONG TERM GOAL #3   Title Patient will report ability to perform ADLs with cervical pain less than or equal to 3/10    Time 8    Period Weeks    Status Achieved                   Plan - 07/31/20 1220     Clinical Impression Statement Patient is very motivated and work hard in therapy.  Cervical spinepain-level staying around  a 3.    Personal Factors and Comorbidities Comorbidity 1;Comorbidity 2    Comorbidities OA, HTN, Right ankle injury.    Examination-Activity Limitations Locomotion Level;Other    Examination-Participation Restrictions Other    Stability/Clinical Decision Making Evolving/Moderate complexity    Rehab Potential Fair    PT Frequency 2x / week    PT Duration 8 weeks    PT Treatment/Interventions ADLs/Self Care Home Management;Cryotherapy;Electrical Stimulation;Ultrasound;Moist Heat;Therapeutic activities;Therapeutic exercise;Manual techniques;Patient/family education;Passive range of motion;Dry needling    PT Next Visit Plan cont with POC with generalized U and LE ther ex, modalities as need to neck and upper back.    Consulted and Agree with Plan of Care Patient             Patient will benefit from skilled therapeutic intervention in order to improve the following deficits and impairments:  Pain, Abnormal gait, Increased muscle spasms, Postural dysfunction, Decreased strength, Decreased mobility, Decreased activity tolerance, Decreased range of motion  Visit Diagnosis: Cervicalgia  Abnormal posture     Problem List Patient Active Problem List   Diagnosis Date Noted   Ankylosing spondylitis of cervicothoracic region ([place]) 10/16/2016   TIA (transient ischemic attack) 08/10/2015   Carotid stenosis 08/04/2014   Hyperlipidemia 02/21/2012   BENIGN PROSTATIC HYPERTROPHY, WITH OBSTRUCTION 01/15/2010   Essential hypertension 11/10/2009   GERD 02/22/2008   NEPHROLITHIASIS, HX OF 11/17/2007   PHYSICAL THERAPY DISCHARGE SUMMARY  Visits from Start of Care: 15.  Current functional level related to goals / functional outcomes: See above.    Remaining deficits: See goal section.   Education / Equipment: HEP.   Patient agrees to discharge. Patient goals were partially met. Patient is being discharged due to being pleased with the current functional level.  [name], [name]  MPT 07/31/2020, 12:22 PM  [facility] [address] [place], [place], [address] Phone: [phone]   Fax:  [phone]  Name: [name] MRN: [id number] Date of Birth: 03-08-1936

## 2020-08-23 ENCOUNTER — Ambulatory Visit: Payer: Medicare Other | Admitting: Family Medicine

## 2020-08-24 DIAGNOSIS — B351 Tinea unguium: Secondary | ICD-10-CM | POA: Diagnosis not present

## 2020-08-24 DIAGNOSIS — M79676 Pain in unspecified toe(s): Secondary | ICD-10-CM | POA: Diagnosis not present

## 2020-10-04 DIAGNOSIS — Z23 Encounter for immunization: Secondary | ICD-10-CM | POA: Diagnosis not present

## 2020-10-04 DIAGNOSIS — S61210A Laceration without foreign body of right index finger without damage to nail, initial encounter: Secondary | ICD-10-CM | POA: Diagnosis not present

## 2020-10-18 ENCOUNTER — Ambulatory Visit (INDEPENDENT_AMBULATORY_CARE_PROVIDER_SITE_OTHER): Payer: Medicare Other | Admitting: Neurology

## 2020-10-18 ENCOUNTER — Encounter: Payer: Self-pay | Admitting: Neurology

## 2020-10-18 VITALS — BP 132/84 | HR 97 | Ht 67.0 in | Wt 159.0 lb

## 2020-10-18 DIAGNOSIS — G20 Parkinson's disease: Secondary | ICD-10-CM

## 2020-10-18 DIAGNOSIS — G4733 Obstructive sleep apnea (adult) (pediatric): Secondary | ICD-10-CM

## 2020-10-18 NOTE — Progress Notes (Signed)
Subjective:    Patient ID: [name] is a 85 y.o. male.  HPI     Interim history:   [name] is an 85 year old right-handed gentleman with an underlying complex medical history of peptic ulcer disease with history of upper GI bleed, TIA, depression, degenerative disc disease in the neck, osteoarthritis, kidney stones, hyperlipidemia, hypertension, history of hiatal hernia, BPH, arthritis, and ankylosing spondylitis, and parkinsonism, who presents for follow-up of his sleep disturbance after recent sleep testing.  The patient is accompanied by his wife again today.  I last saw him on 05/10/2020, at which time we went over his sleep study results.  He was agreeable to starting AutoPap therapy.    Today, 10/18/2020: He reports  feeling stable with regards to his parkinsonism.  He tolerates the Sinemet 1 pill 4 times a day and finds it helpful.  He has not had any side effects.  He has not been set up yet with AutoPap therapy.  He would like to go back to [facility] for this.  He would be willing to start a machine other than the ResMed model. He had one fall about a month ago, he tripped over a chair leg.  He bumped his left knee.  He has had some stressors work with his wife's medical condition.  Thankfully, this has been stabilized.   Previously:   I saw him on 04/17/2020, at which time he reported some decline in his mobility.  He was advised to increase C/L to 4 times a day.  His wife reported that he had some irregularities in his breathing while asleep, also some snoring, he reported congestion and feeling of excess mucus.  He was advised to proceed with a sleep study.  He had a baseline sleep study on 04/26/2020 and we reviewed test results today.  Sleep efficiency was reduced at 64%, sleep latency 43.5 minutes, REM latency 209.5 minutes.  He had a significantly reduced percentage of REM sleep at 6.2, he had moderate to severe sleep fragmentation, wake after sleep onset was 140 minutes.  Total  AHI was 18.3/h.  Supine AHI was 19.8/h.  Average oxygen saturation was 98%, nadir was 87%.  He had mild PLM's no significant arousals.  He was advised to proceed with treatment at home in the form of AutoPap.  He requested a follow-up appointment to discuss.      I saw him on 03/24/2019, at which time he reported doing okay with Sinemet, he was taking it 3 times a day.  He had noticed some benefit including improvement in his mobility and balance.  He felt that perhaps his left hand tremor was a little better.     I spoke to him on 12/23/2018 via phone visit, at which time we mutually agreed to start Sinemet with gradual titration.  He had to reschedule his DaTscan secondary to the virus pandemic.  He had his DaTscan in the interim on 01/07/2019 and I reviewed the results: IMPRESSION: Decreased activity within the LEFT and RIGHT posterior striata is a pattern typical of Parkinson's syndrome pathology.   We called him with his test results.       I first met him on 09/16/2018 at the request of his rheumatology PA, at which time he reported a bilateral hand tremor for the past at least one year, left side more noticeable than right. He had also increased difficulty with mobility. He had some findings concerning for parkinsonism. I suggested we proceed with a nuclear medicine  DaT scan but this was delayed secondary to the coronavirus pandemic.    09/16/2018: (He) reports bilateral hand tremor, left more than right for the past at least one year with some progression noted. His wife has noted significant impairment in his mobility this time. He has a left more than right tremor, the left hand is the most significant. He has not noticed any lower extremity tremor. He does have restriction in his shoulder mobility, left more than right. He has no family history of Parkinson's disease, does not know much about his mother's history her family history, she died at 20, when he was 85 years old. His paternal  grandmother lived to be above 71 years old. He had a sister who died young at 98 from lupus, his brother has significant arthritis issues at age 56. He injured his right ankle and foot when he was a child and continues to have restricted movements in his right ankle. He has fallen a few times. He fell one or 2 times in the last 6 months, one time he fell in the bathroom and one time he fell in the closet. He has had intermittent numbness in both hands, particularly the right hand, particularly with certain movements or posture, such as while holding the steering wheel.    I reviewed your office note from 07/10/2018, which you kindly included. He has also had some balance issues. For his arthritis he takes Cosentyx injections monthly. Of note, he had a brain MRI without contrast as well as MRA head without contrast on 08/10/2015 and I reviewed the results: IMPRESSION: MRI HEAD IMPRESSION:   Negative brain MRI with no acute intracranial infarct or other process identified.   MRA HEAD IMPRESSION:   Negative intracranial MRA. No large or proximal arterial branch occlusion. No high-grade or correctable stenosis.   He is retired, lives with his wife, he quit smoking and drinks alcohol about once a week, caffeine in the form of coffee, 2-3 cups per day on average.   His Past Medical History Is Significant For: Past Medical History:  Diagnosis Date  . Ankylosing spondylitis ([place]) dx'd ~ 1974  . Arthritis    "back" (08/10/2015)  . BENIGN PROSTATIC HYPERTROPHY, WITH OBSTRUCTION 01/15/2010  . Bleeding duodenal ulcer   . Bleeding esophageal ulcer   . Bleeding stomach ulcer   . GERD 02/22/2008  . History of blood transfusion 1988   "lost ~ 1/2 of my blood volume from multiple bleeding ulcers"  . History of hiatal hernia   . HYPERGLYCEMIA 11/18/2007  . HYPERLIPIDEMIA 02/22/2008  . HYPERTENSION, UNSPECIFIED 11/10/2009  . Kidney stones   . Osteoarthritis    c-spine  . PEPTIC ULCER DISEASE 11/17/2007   . Situational depression    "son died in [place] 2015/06/22"  . TIA (transient ischemic attack)    "not that I know of in the past; they are trying to determine if I've had one today (08/10/2015)"    His Past Surgical History Is Significant For: Past Surgical History:  Procedure Laterality Date  . CATARACT EXTRACTION W/ INTRAOCULAR LENS  IMPLANT, BILATERAL Bilateral 2013    His Family History Is Significant For: Family History  Problem Relation Age of Onset  . Appendicitis Mother        complications  . Colon cancer Father   . Diabetes type II Father   . Lupus Sister   . Colon cancer Other        1st degree relative <60    His Social  History Is Significant For: Social History   Socioeconomic History  . Marital status: Married    Spouse name: Not on file  . Number of children: Not on file  . Years of education: Not on file  . Highest education level: Not on file  Occupational History  . Not on file  Tobacco Use  . Smoking status: Former Smoker    Years: 15.00    Types: Cigars, Pipe    Quit date: 08/26/1978    Years since quitting: 42.1  . Smokeless tobacco: Never Used  Substance and Sexual Activity  . Alcohol use: Yes    Comment: 08/10/2015 "glass of wine ~ month or so; occasionally a can of beer"  . Drug use: No  . Sexual activity: Yes  Other Topics Concern  . Not on file  Social History Narrative   Retired and married.    Social Determinants of Health   Financial Resource Strain: Not on file  Food Insecurity: Not on file  Transportation Needs: Not on file  Physical Activity: Not on file  Stress: Not on file  Social Connections: Not on file    His Allergies Are:  No Known Allergies:   His Current Medications Are:  Outpatient Encounter Medications as of 10/18/2020  Medication Sig  . aspirin 81 MG tablet Take 81 mg by mouth daily.    Marland Kitchen atorvastatin (LIPITOR) 10 MG tablet TAKE 1 TABLET BY MOUTH  DAILY  . carbidopa-levodopa (SINEMET IR) 25-100 MG tablet Take  1 tablet by mouth 4 (four) times daily.  . Cholecalciferol (VITAMIN D PO) Take 1,000 Units by mouth daily.  Marland Kitchen levothyroxine (SYNTHROID, LEVOTHROID) 25 MCG tablet Take 1 tablet (25 mcg total) by mouth daily before breakfast.  . losartan-hydrochlorothiazide (HYZAAR) 50-12.5 MG tablet TAKE ONE-HALF TABLET BY  MOUTH DAILY  . Multiple Vitamin (MULTIVITAMIN WITH MINERALS) TABS tablet Take 1 tablet by mouth daily.  . nitroGLYCERIN (NITROSTAT) 0.4 MG SL tablet Place 1 tablet (0.4 mg total) under the tongue every 5 (five) minutes as needed.  . pantoprazole (PROTONIX) 20 MG tablet Take 20 mg by mouth daily.  . Secukinumab (COSENTYX [place]) Inject into the skin.  Marland Kitchen sulindac (CLINORIL) 150 MG tablet Take 150 mg by mouth daily as needed.   No facility-administered encounter medications on file as of 10/18/2020.  :  Review of Systems:  Out of a complete 14 point review of systems, all are reviewed and negative with the exception of these symptoms as listed below: Review of Systems  Neurological:       Here for 6 month f/u, pt has not started on new pap machine yet. Pt does want to start the [place] machine prefers the resmed machine.     Objective:  Neurological Exam  Physical Exam Physical Examination:   Vitals:   10/18/20 1048  BP: 132/84  Pulse: 97  SpO2: 98%   General Examination: The patient is a very pleasant 85 y.o. male in no acute distress. He appears well-developed and well-nourished and well groomed.   HEENT:Normocephalic, atraumatic, pupils are equal, round and reactive to light, extraocular tracking is preserved fairly well. He has no nystagmus, hearing is mildly impaired. He hasmild facial masking and mildhypophonia, stable. There are no carotid bruits on auscultation. Oropharynx exam reveals:adequatedental hygiene, tongue protrudes centrally and palate elevates symmetrically.  Small airway entry, overall mild airway crowding.  Chest:Clear to auscultation without wheezing, rhonchi  or crackles noted.  Heart:S1+S2+0, regular and normal without murmurs, rubs or gallops noted.  Abdomen:Soft, non-tender and non-distended.  Extremities:There isnopitting edema in the distal lower extremities bilaterally.   Skin: Warm and dry without trophic changes noted.  Musculoskeletal: exam revealslower back stiffness,reduced ROM inneck, restricted mobility in the left shoulder, arthritic changes in both hands.Restricted mobility in the right ankle from a childhood injury, all fairly stable.  Neurologically:  Mental status: The patient is awake, alert and oriented in all 4 spheres.Hisimmediate and remote memory, attention, language skills and fund of knowledge are appropriate. There is no evidence of aphasia, agnosia, apraxia or anomia.  Cranial nerves II - XII are as described above under HEENT exam.  Motor exam: no focal atrophy.  Strength is well-preserved, mild increased tone in the left upper extremity only.  (On1/22/2020: on Archimedes spiral drawing he has moderate difficulty with the left hand, mild difficulty with right hand. Handwriting with his dominant, right hand is somewhat difficult to read, on the smaller side, not particularly tremulous.)  He has amild but intermittent resting tremor in the left hand,better.He has no other resting tremor.  Fine motor skills are mildly impaired globally. Cerebellar testing: No dysmetria or intention tremor.  Sensory exam: intact to light touch.  Gait, station and balance:Hestands with difficulty. No veering to one side is noted. No leaning to one side is noted. Posture ismoderately stooped with increase in upper back kyphosis noted. He walks without a walking aid, slowly and cautiously.  Assessmentand Plan:   In summary,Aarian F Mossis an 33 year oldmalewith an underlying complex medical history of peptic ulcer disease with history of upper GI bleed, TIA, depression, degenerative disc disease in the  neck, osteoarthritis, kidney stones, hyperlipidemia, hypertension, history of hiatal hernia, BPH, arthritis, including ankylosing spondylitis(followed byrheumatology), and parkinsonism, with left-sided predominance noted, who presents for follow-up consultation of his parkinsonism as well as sleep apnea.  His DaTscan from 01/07/2019 was supportive for an underlying parkinsonian syndrome.  His diagnostic sleep study on 04/26/2020 showed moderate obstructive sleep apnea by a number of events with an AHI of 18.3/h, but milder desaturations, O2 nadir of 87%.  We previously went over his test results in detail in September 2021.  He also had some leg movements without significant arousals. He has not yet started AutoPap therapy but is willing to start, we will contact [facility] again, he is willing to pursue a machine other than the ResMed model.  He is advised to continue with Sinemet 1 pill 4 times a day, we increased this from 3 times daily in August 2021.  His exam is stable in that regard.  He is advised to follow-up routinely to see one of our nurse practitioners in 3 months, sooner if needed, we may also have to delay the appointment if there is an additional delay in starting AutoPap therapy.  I answered all the questions today and the patient and his wife were in agreement. I spent 20 minutes in total face-to-face time and in reviewing records during pre-charting, more than 50% of which was spent in counseling and coordination of care, reviewing test results, reviewing medications and treatment regimen and/or in discussing or reviewing the diagnosis of PD, OSA, the prognosis and treatment options. Pertinent laboratory and imaging test results that were available during this visit with the patient were reviewed by me and considered in my medical decision making (see chart for details).

## 2020-10-18 NOTE — Patient Instructions (Signed)
We will reach out to your DME company for update on your AutoPap machine, if you are agreeable to starting with the [facility], we can probably get you started within the next month.  As per your request, since you most recently talked to [facility], we will pursue the AutoPap set up through [facility].  Please follow-up in 3 months to see one of our nurse practitioners.  If there is an additional delay in getting you set up with AutoPap within the next month and a half, we may have to push out your follow-up appointment accordingly.  Please continue with Sinemet 1 pill 4 times a day.

## 2020-10-19 DIAGNOSIS — Z125 Encounter for screening for malignant neoplasm of prostate: Secondary | ICD-10-CM | POA: Diagnosis not present

## 2020-10-19 DIAGNOSIS — E039 Hypothyroidism, unspecified: Secondary | ICD-10-CM | POA: Diagnosis not present

## 2020-10-19 DIAGNOSIS — E785 Hyperlipidemia, unspecified: Secondary | ICD-10-CM | POA: Diagnosis not present

## 2020-10-26 DIAGNOSIS — I10 Essential (primary) hypertension: Secondary | ICD-10-CM | POA: Diagnosis not present

## 2020-10-26 DIAGNOSIS — E785 Hyperlipidemia, unspecified: Secondary | ICD-10-CM | POA: Diagnosis not present

## 2020-10-26 DIAGNOSIS — K219 Gastro-esophageal reflux disease without esophagitis: Secondary | ICD-10-CM | POA: Diagnosis not present

## 2020-10-26 DIAGNOSIS — I6523 Occlusion and stenosis of bilateral carotid arteries: Secondary | ICD-10-CM | POA: Diagnosis not present

## 2020-10-26 DIAGNOSIS — R0982 Postnasal drip: Secondary | ICD-10-CM | POA: Diagnosis not present

## 2020-10-26 DIAGNOSIS — R82998 Other abnormal findings in urine: Secondary | ICD-10-CM | POA: Diagnosis not present

## 2020-10-26 DIAGNOSIS — Z0000 Encounter for general adult medical examination without abnormal findings: Secondary | ICD-10-CM | POA: Diagnosis not present

## 2020-10-26 DIAGNOSIS — E039 Hypothyroidism, unspecified: Secondary | ICD-10-CM | POA: Diagnosis not present

## 2020-10-26 DIAGNOSIS — M459 Ankylosing spondylitis of unspecified sites in spine: Secondary | ICD-10-CM | POA: Diagnosis not present

## 2020-10-26 DIAGNOSIS — N1831 Chronic kidney disease, stage 3a: Secondary | ICD-10-CM | POA: Diagnosis not present

## 2020-10-26 DIAGNOSIS — M25512 Pain in left shoulder: Secondary | ICD-10-CM | POA: Diagnosis not present

## 2020-10-26 DIAGNOSIS — R17 Unspecified jaundice: Secondary | ICD-10-CM | POA: Diagnosis not present

## 2020-10-26 DIAGNOSIS — G20 Parkinson's disease: Secondary | ICD-10-CM | POA: Diagnosis not present

## 2020-10-30 ENCOUNTER — Telehealth: Payer: Self-pay | Admitting: Neurology

## 2020-10-30 ENCOUNTER — Other Ambulatory Visit: Payer: Self-pay | Admitting: Internal Medicine

## 2020-10-30 DIAGNOSIS — R17 Unspecified jaundice: Secondary | ICD-10-CM

## 2020-10-30 NOTE — Telephone Encounter (Signed)
I called [name] at [facility] and she verified pt should be starting machine soon did not have a definite date though. I sent pt a my chart message and relayed this information.

## 2020-10-30 NOTE — Telephone Encounter (Signed)
Pt called, checking the status on my CPAP machine. Have not received a call from anyone to get my CPAP machine. Would like a call from the nurse.

## 2020-11-07 DIAGNOSIS — M545 Low back pain, unspecified: Secondary | ICD-10-CM | POA: Diagnosis not present

## 2020-11-07 DIAGNOSIS — Z79899 Other long term (current) drug therapy: Secondary | ICD-10-CM | POA: Diagnosis not present

## 2020-11-07 DIAGNOSIS — M150 Primary generalized (osteo)arthritis: Secondary | ICD-10-CM | POA: Diagnosis not present

## 2020-11-07 DIAGNOSIS — M459 Ankylosing spondylitis of unspecified sites in spine: Secondary | ICD-10-CM | POA: Diagnosis not present

## 2020-11-07 DIAGNOSIS — E663 Overweight: Secondary | ICD-10-CM | POA: Diagnosis not present

## 2020-11-07 DIAGNOSIS — M2550 Pain in unspecified joint: Secondary | ICD-10-CM | POA: Diagnosis not present

## 2020-11-07 DIAGNOSIS — Z6825 Body mass index (BMI) 25.0-25.9, adult: Secondary | ICD-10-CM | POA: Diagnosis not present

## 2020-11-07 DIAGNOSIS — R252 Cramp and spasm: Secondary | ICD-10-CM | POA: Diagnosis not present

## 2020-11-08 ENCOUNTER — Telehealth: Payer: Self-pay | Admitting: Neurology

## 2020-11-08 NOTE — Telephone Encounter (Signed)
I have sent a my chart message about this.

## 2020-11-08 NOTE — Telephone Encounter (Signed)
Pt called to inform RN that he spoke with Ivin Booty @ [facility] and was told they are working on orders from November & have no idea when they will have one for him.  Please call

## 2020-11-08 NOTE — Progress Notes (Signed)
CARDIOLOGY OFFICE NOTE  Date:  11/08/2020    [name] Date of Birth: Mar 15, 1936 Medical Record #[id number]  PCP:  Casey Battles, MD  Cardiologist:  Casey Chason Martinique MD   No chief complaint on file.   History of Present Illness: [name] is a 85 y.o. male who presents today for cardiac follow up.   He has a history of hyperlipidemia, HTN, and GERD. He has no known CAD. Negative stress echo in 2011. Does have known carotid disease -  study in 07/2014 with 40 to 59% RICA stenosis. Admitted in December 2016 with possible TIA. Echo was unremarkable. Carotid dopplers then showed only mild disease.   On follow up today he states he is doing well. Some seasonal allergy.  He has been diagnosed with Parkinson's disease and ankylosing spondilitis is on Cosentyx.  No chest pain or dyspnea.,    Past Medical History:  Diagnosis Date  . Ankylosing spondylitis ([place]) dx'd ~ 1974  . Arthritis    "back" (08/10/2015)  . BENIGN PROSTATIC HYPERTROPHY, WITH OBSTRUCTION 01/15/2010  . Bleeding duodenal ulcer   . Bleeding esophageal ulcer   . Bleeding stomach ulcer   . GERD 02/22/2008  . History of blood transfusion 1988   "lost ~ 1/2 of my blood volume from multiple bleeding ulcers"  . History of hiatal hernia   . HYPERGLYCEMIA 11/18/2007  . HYPERLIPIDEMIA 02/22/2008  . HYPERTENSION, UNSPECIFIED 11/10/2009  . Kidney stones   . Osteoarthritis    c-spine  . PEPTIC ULCER DISEASE 11/17/2007  . Situational depression    "son died in [place] July 06, 2015"  . TIA (transient ischemic attack)    "not that I know of in the past; they are trying to determine if I've had one today (08/10/2015)"    Past Surgical History:  Procedure Laterality Date  . CATARACT EXTRACTION W/ INTRAOCULAR LENS  IMPLANT, BILATERAL Bilateral 2013     Medications: Current Outpatient Medications  Medication Sig Dispense Refill  . aspirin 81 MG tablet Take 81 mg by mouth daily.      Marland Kitchen atorvastatin (LIPITOR) 10 MG tablet TAKE  1 TABLET BY MOUTH  DAILY 90 tablet 3  . carbidopa-levodopa (SINEMET IR) 25-100 MG tablet Take 1 tablet by mouth 4 (four) times daily. 360 tablet 3  . Cholecalciferol (VITAMIN D PO) Take 1,000 Units by mouth daily.    Marland Kitchen levothyroxine (SYNTHROID, LEVOTHROID) 25 MCG tablet Take 1 tablet (25 mcg total) by mouth daily before breakfast. 90 tablet 3  . losartan-hydrochlorothiazide (HYZAAR) 50-12.5 MG tablet TAKE ONE-HALF TABLET BY  MOUTH DAILY 45 tablet 3  . Multiple Vitamin (MULTIVITAMIN WITH MINERALS) TABS tablet Take 1 tablet by mouth daily.    . nitroGLYCERIN (NITROSTAT) 0.4 MG SL tablet Place 1 tablet (0.4 mg total) under the tongue every 5 (five) minutes as needed. 25 tablet 6  . pantoprazole (PROTONIX) 20 MG tablet Take 20 mg by mouth daily.    . Secukinumab (COSENTYX [place]) Inject into the skin.    Marland Kitchen sulindac (CLINORIL) 150 MG tablet Take 150 mg by mouth daily as needed.     No current facility-administered medications for this visit.    Allergies: No Known Allergies  Social History: The patient  reports that he quit smoking about 42 years ago. His smoking use included cigars and pipe. He quit after 15.00 years of use. He has never used smokeless tobacco. He reports current alcohol use. He reports that he does not use drugs.   Family  History: The patient's family history includes Appendicitis in his mother; Colon cancer in his father and another family member; Diabetes type II in his father; Lupus in his sister.   Review of Systems: Please see the history of present illness.   All other systems are reviewed and negative.   Physical Exam: VS:  There were no vitals taken for this visit. Marland Kitchen  BMI There is no height or weight on file to calculate BMI.  Wt Readings from Last 3 Encounters:  10/18/20 159 lb (72.1 kg)  05/10/20 159 lb (72.1 kg)  04/17/20 160 lb (72.6 kg)   GENERAL:  Well appearing WM in NAD HEENT:  PERRL, EOMI, sclera are clear. Oropharynx is clear. NECK:  No jugular venous  distention, carotid upstroke brisk and symmetric, no bruits, no thyromegaly or adenopathy LUNGS:  Clear to auscultation bilaterally CHEST:  Kyphotic.  HEART:  RRR,  PMI not displaced or sustained,S1 and S2 within normal limits, no S3, no S4: no clicks, no rubs, no murmurs ABD:  Soft, nontender. BS +, no masses or bruits. No hepatomegaly, no splenomegaly EXT:  2 + pulses throughout, no edema, no cyanosis no clubbing SKIN:  Warm and dry.  No rashes NEURO:  Alert and oriented x 3. Cranial nerves II through XII intact. PSYCH:  Cognitively intact   LABORATORY DATA:  EKG:  EKG is  ordered today. It shows NSR rate 83. Normal Ecg. I have personally reviewed and interpreted this study.   Lab Results  Component Value Date   WBC 5.6 11/04/2017   HGB 12.4 (L) 11/04/2017   HCT 36.0 (L) 11/04/2017   PLT 189.0 11/04/2017   GLUCOSE 98 09/16/2018   CHOL 118 11/04/2017   TRIG 35.0 11/04/2017   HDL 68.70 11/04/2017   LDLCALC 42 11/04/2017   ALT 20 09/16/2018   AST 22 09/16/2018   NA 141 09/16/2018   K 4.9 09/16/2018   CL 102 09/16/2018   CREATININE 1.16 09/16/2018   BUN 25 09/16/2018   CO2 22 09/16/2018   TSH 3.29 12/25/2017   PSA 0.38 01/15/2010   INR 0.9 11/03/2007   HGBA1C 5.9 (H) 08/11/2015   Dated 05/29/17: creatinine 1.17. Hgb 12.4.  Dated 10/13/19: cholesterol 141, triglycerides 76, HDL 59, LDL 67. Creatinine 1.2. otherwise CMET, CBC, TFTs normal. Dated 10/19/20: cholesterol 133, triglycerides 71, HDL 60, LDL 59. Chemistries and TSH normal  BNP (last 3 results) No results for input(s): BNP in the last 8760 hours.  ProBNP (last 3 results) No results for input(s): PROBNP in the last 8760 hours.   Other Studies Reviewed Today: Carotid dopplers 12/03/16: 1-39% bilateral carotid disease.  Assessment/Plan: 1. Carotid stenosis: mild nonobstructive by doppler in 2018.   2. Hyperlipidemia: on statin therapy. Excellent control    3. HTN: well controlled on current medication.  Continue.  4.. Ankylosing spondylitis  5. Parkinson's disease. Followed by Neuro.  Current medicines are reviewed with the patient today.  The patient does not have concerns regarding medicines other than what has been noted above.  The following changes have been made:  See above.  Labs/ tests ordered today include:    No orders of the defined types were placed in this encounter.  Follow up in one year  Signed: Jaaron Oleson Martinique MD, [facility]  11/08/2020 9:04 AM  [place]

## 2020-11-09 DIAGNOSIS — B351 Tinea unguium: Secondary | ICD-10-CM | POA: Diagnosis not present

## 2020-11-09 DIAGNOSIS — M79676 Pain in unspecified toe(s): Secondary | ICD-10-CM | POA: Diagnosis not present

## 2020-11-10 ENCOUNTER — Encounter: Payer: Self-pay | Admitting: Cardiology

## 2020-11-10 ENCOUNTER — Ambulatory Visit (INDEPENDENT_AMBULATORY_CARE_PROVIDER_SITE_OTHER): Payer: Medicare Other | Admitting: Cardiology

## 2020-11-10 ENCOUNTER — Other Ambulatory Visit: Payer: Self-pay

## 2020-11-10 VITALS — BP 148/76 | HR 83 | Ht 66.0 in | Wt 157.2 lb

## 2020-11-10 DIAGNOSIS — E7800 Pure hypercholesterolemia, unspecified: Secondary | ICD-10-CM

## 2020-11-10 DIAGNOSIS — I10 Essential (primary) hypertension: Secondary | ICD-10-CM | POA: Diagnosis not present

## 2020-11-14 DIAGNOSIS — D1801 Hemangioma of skin and subcutaneous tissue: Secondary | ICD-10-CM | POA: Diagnosis not present

## 2020-11-14 DIAGNOSIS — L814 Other melanin hyperpigmentation: Secondary | ICD-10-CM | POA: Diagnosis not present

## 2020-11-14 DIAGNOSIS — L905 Scar conditions and fibrosis of skin: Secondary | ICD-10-CM | POA: Diagnosis not present

## 2020-11-14 DIAGNOSIS — L0109 Other impetigo: Secondary | ICD-10-CM | POA: Diagnosis not present

## 2020-11-14 DIAGNOSIS — L819 Disorder of pigmentation, unspecified: Secondary | ICD-10-CM | POA: Diagnosis not present

## 2020-11-14 DIAGNOSIS — Z85828 Personal history of other malignant neoplasm of skin: Secondary | ICD-10-CM | POA: Diagnosis not present

## 2020-11-14 DIAGNOSIS — L821 Other seborrheic keratosis: Secondary | ICD-10-CM | POA: Diagnosis not present

## 2020-11-14 DIAGNOSIS — L570 Actinic keratosis: Secondary | ICD-10-CM | POA: Diagnosis not present

## 2020-11-14 DIAGNOSIS — I8393 Asymptomatic varicose veins of bilateral lower extremities: Secondary | ICD-10-CM | POA: Diagnosis not present

## 2020-11-15 ENCOUNTER — Ambulatory Visit
Admission: RE | Admit: 2020-11-15 | Discharge: 2020-11-15 | Disposition: A | Discharge status: Home / Self Care | Payer: Medicare Other | Source: Ambulatory Visit | Attending: Internal Medicine | Admitting: Internal Medicine

## 2020-11-15 DIAGNOSIS — R17 Unspecified jaundice: Secondary | ICD-10-CM

## 2020-11-15 DIAGNOSIS — K7689 Other specified diseases of liver: Secondary | ICD-10-CM | POA: Diagnosis not present

## 2020-12-16 ENCOUNTER — Ambulatory Visit
Admission: EM | Admit: 2020-12-16 | Discharge: 2020-12-16 | Disposition: A | Discharge status: Home / Self Care | Payer: Medicare Other | Attending: Family Medicine | Admitting: Family Medicine

## 2020-12-16 ENCOUNTER — Other Ambulatory Visit: Payer: Self-pay

## 2020-12-16 ENCOUNTER — Encounter: Payer: Self-pay | Admitting: Emergency Medicine

## 2020-12-16 DIAGNOSIS — J0140 Acute pansinusitis, unspecified: Secondary | ICD-10-CM

## 2020-12-16 MED ORDER — AMOXICILLIN 500 MG PO TABS: 500.0000 mg | ORAL_TABLET | Freq: Two times a day (BID) | ORAL | 0 refills | Status: AC

## 2020-12-16 MED ORDER — FLUTICASONE PROPIONATE 50 MCG/ACT NA SUSP: 2.0000 | Freq: Every day | NASAL | 2 refills | Status: DC

## 2020-12-16 NOTE — ED Triage Notes (Signed)
Rapid test on Tuesday was negative.  Has been triple vaccinated.

## 2020-12-16 NOTE — ED Triage Notes (Signed)
Pt has had congestion for about 2 weeks, mucous is bright yellow in color.  No fevers.  Has had some nausea last few days.  Tried zyrtec for about 4 days.

## 2020-12-16 NOTE — ED Provider Notes (Signed)
[facility]   [id number] 12/16/20 Arrival Time: 1108  ZY:SAYT THROAT  SUBJECTIVE: History from: patient.  [name] is a 85 y.o. male who presents with abrupt onset of nasal congestion, headache, fatigue for the last 2 weeks.  Denies sick exposure to Covid, strep, flu or mono, or precipitating event. Has negative history of Covid. Has had Covid vaccines, as well as booster.  Has completed flu vaccine this season has tried Zyrtec without relief.  Reports purulent nasal discharge.  There are no aggravating symptoms. Denies previous symptoms in the past.     Denies fever, chills, ear pain, cough, SOB, wheezing, chest pain, nausea, rash, changes in bowel or bladder habits.    ROS: As per HPI.  All other pertinent ROS negative.     Past Medical History:  Diagnosis Date  . Ankylosing spondylitis ([place]) dx'd ~ 1974  . Arthritis    "back" (08/10/2015)  . BENIGN PROSTATIC HYPERTROPHY, WITH OBSTRUCTION 01/15/2010  . Bleeding duodenal ulcer   . Bleeding esophageal ulcer   . Bleeding stomach ulcer   . GERD 02/22/2008  . History of blood transfusion 1988   "lost ~ 1/2 of my blood volume from multiple bleeding ulcers"  . History of hiatal hernia   . HYPERGLYCEMIA 11/18/2007  . HYPERLIPIDEMIA 02/22/2008  . HYPERTENSION, UNSPECIFIED 11/10/2009  . Kidney stones   . Osteoarthritis    c-spine  . PEPTIC ULCER DISEASE 11/17/2007  . Situational depression    "son died in [place] 06/14/15"  . TIA (transient ischemic attack)    "not that I know of in the past; they are trying to determine if I've had one today (08/10/2015)"   Past Surgical History:  Procedure Laterality Date  . CATARACT EXTRACTION W/ INTRAOCULAR LENS  IMPLANT, BILATERAL Bilateral 2013   No Known Allergies No current facility-administered medications on file prior to encounter.   Current Outpatient Medications on File Prior to Encounter  Medication Sig Dispense Refill  . aspirin 81 MG tablet Take 81 mg by mouth daily.       Marland Kitchen atorvastatin (LIPITOR) 10 MG tablet TAKE 1 TABLET BY MOUTH  DAILY 90 tablet 3  . carbidopa-levodopa (SINEMET IR) 25-100 MG tablet Take 1 tablet by mouth 4 (four) times daily. 360 tablet 3  . Cholecalciferol (VITAMIN D PO) Take 1,000 Units by mouth daily.    Marland Kitchen levothyroxine (SYNTHROID, LEVOTHROID) 25 MCG tablet Take 1 tablet (25 mcg total) by mouth daily before breakfast. 90 tablet 3  . losartan-hydrochlorothiazide (HYZAAR) 50-12.5 MG tablet TAKE ONE-HALF TABLET BY  MOUTH DAILY 45 tablet 3  . Multiple Vitamin (MULTIVITAMIN WITH MINERALS) TABS tablet Take 1 tablet by mouth daily.    . nitroGLYCERIN (NITROSTAT) 0.4 MG SL tablet Place 1 tablet (0.4 mg total) under the tongue every 5 (five) minutes as needed. 25 tablet 6  . pantoprazole (PROTONIX) 20 MG tablet Take 20 mg by mouth daily.    . Secukinumab (COSENTYX [place]) Inject into the skin.    Marland Kitchen sulindac (CLINORIL) 150 MG tablet Take 150 mg by mouth daily as needed.     Social History   Socioeconomic History  . Marital status: Married    Spouse name: Not on file  . Number of children: Not on file  . Years of education: Not on file  . Highest education level: Not on file  Occupational History  . Not on file  Tobacco Use  . Smoking status: Former Smoker    Years: 15.00    Types:  Cigars, Pipe    Quit date: 08/26/1978    Years since quitting: 42.3  . Smokeless tobacco: Never Used  Substance and Sexual Activity  . Alcohol use: Yes    Comment: 08/10/2015 "glass of wine ~ month or so; occasionally a can of beer"  . Drug use: No  . Sexual activity: Yes  Other Topics Concern  . Not on file  Social History Narrative   Retired and married.    Social Determinants of Health   Financial Resource Strain: Not on file  Food Insecurity: Not on file  Transportation Needs: Not on file  Physical Activity: Not on file  Stress: Not on file  Social Connections: Not on file  Intimate Partner Violence: Not on file   Family History  Problem Relation  Age of Onset  . Appendicitis Mother        complications  . Colon cancer Father   . Diabetes type II Father   . Lupus Sister   . Colon cancer Other        1st degree relative <60    OBJECTIVE:  Vitals:   12/16/20 1131 12/16/20 1132  BP: 130/65   Pulse: 86   Resp: 16   Temp: 97.7 F (36.5 C)   TempSrc: Oral   SpO2: 97%   Weight:  155 lb (70.3 kg)     General appearance: alert; appears fatigued, but nontoxic, speaking in full sentences and managing own secretions HEENT: NCAT; Ears: EACs clear, TMs pearly gray with visible cone of light, without erythema; Eyes: PERRL, EOMI grossly; Nose: no obvious rhinorrhea; Throat: oropharynx clear, tonsils 1+ and mildly erythematous without white tonsillar exudates, uvula midline; Sinuses: tender to palpation Neck: supple without LAD Lungs: CTA bilaterally without adventitious breath sounds; cough absent Heart: regular rate and rhythm.  Radial pulses 2+ symmetrical bilaterally Skin: warm and dry Psychological: alert and cooperative; normal mood and affect  LABS: No results found for this or any previous visit (from the past 24 hour(s)).   ASSESSMENT & PLAN:  1. Acute non-recurrent pansinusitis     Meds ordered this encounter  Medications  . amoxicillin (AMOXIL) 500 MG tablet    Sig: Take 1 tablet (500 mg total) by mouth 2 (two) times daily for 7 days.    Dispense:  14 tablet    Refill:  0    Order Specific Question:   Supervising Provider    Answer:   Chase Picket [id number]  . fluticasone (FLONASE) 50 MCG/ACT nasal spray    Sig: Place 2 sprays into both nostrils daily.    Dispense:  9.9 mL    Refill:  2    Order Specific Question:   Supervising Provider    Answer:   Chase Picket [[id number]]    Acute Sinusitis Push fluids and get rest Prescribed amoxicillin twice daily x7 days Prescribed Flonase Take as directed and to completion.  Drink warm or cool liquids, use throat lozenges, or popsicles to help alleviate  symptoms Take OTC ibuprofen or tylenol as needed for pain May use Zyrtec D and flonase to help alleviate symptoms Follow up with PCP if symptoms persist Return or go to ER if you have any new or worsening symptoms such as fever, chills, nausea, vomiting, worsening sore throat, cough, abdominal pain, chest pain, changes in bowel or bladder habits.   Reviewed expectations re: course of current medical issues. Questions answered. Outlined signs and symptoms indicating need for more acute intervention. Patient verbalized understanding. After Visit Summary  given.          Faustino Congress, NP 12/16/20 1149

## 2020-12-16 NOTE — Discharge Instructions (Addendum)
I have sent in Augmentin for you to take twice a day for 7 days.  May use flonase as needed for congestion  Follow up with this office or with primary care if symptoms are persisting.  Follow up in the ER for high fever, trouble swallowing, trouble breathing, other concerning symptoms.

## 2020-12-20 DIAGNOSIS — E039 Hypothyroidism, unspecified: Secondary | ICD-10-CM | POA: Diagnosis not present

## 2020-12-25 DIAGNOSIS — Z23 Encounter for immunization: Secondary | ICD-10-CM | POA: Diagnosis not present

## 2021-01-16 DIAGNOSIS — Z961 Presence of intraocular lens: Secondary | ICD-10-CM | POA: Diagnosis not present

## 2021-01-16 DIAGNOSIS — H52203 Unspecified astigmatism, bilateral: Secondary | ICD-10-CM | POA: Diagnosis not present

## 2021-01-18 DIAGNOSIS — M79676 Pain in unspecified toe(s): Secondary | ICD-10-CM | POA: Diagnosis not present

## 2021-01-18 DIAGNOSIS — B351 Tinea unguium: Secondary | ICD-10-CM | POA: Diagnosis not present

## 2021-01-23 NOTE — Patient Instructions (Addendum)
Please continue using your CPAP regularly. While your insurance requires that you use CPAP at least 4 hours each night on 70% of the nights, I recommend, that you not skip any nights and use it throughout the night if you can. Getting used to CPAP and staying with the treatment long term does take time and patience and discipline. Untreated obstructive sleep apnea when it is moderate to severe can have an adverse impact on cardiovascular health and raise her risk for heart disease, arrhythmias, hypertension, congestive heart failure, stroke and diabetes. Untreated obstructive sleep apnea causes sleep disruption, nonrestorative sleep, and sleep deprivation. This can have an impact on your day to day functioning and cause daytime sleepiness and impairment of cognitive function, memory loss, mood disturbance, and problems focussing. Using CPAP regularly can improve these symptoms.  We will ask the DME company to check on your mask. You are doing great! Follow up in 3 months   Sleep Apnea Sleep apnea affects breathing during sleep. It causes breathing to stop for a short time or to become shallow. It can also increase the risk of:  Heart attack.  Stroke.  Being very overweight (obese).  Diabetes.  Heart failure.  Irregular heartbeat. The goal of treatment is to help you breathe normally again. What are the causes? There are three kinds of sleep apnea:  Obstructive sleep apnea. This is caused by a blocked or collapsed airway.  Central sleep apnea. This happens when the brain does not send the right signals to the muscles that control breathing.  Mixed sleep apnea. This is a combination of obstructive and central sleep apnea. The most common cause of this condition is a collapsed or blocked airway. This can happen if:  Your throat muscles are too relaxed.  Your tongue and tonsils are too large.  You are overweight.  Your airway is too small.   What increases the risk?  Being  overweight.  Smoking.  Having a small airway.  Being older.  Being male.  Drinking alcohol.  Taking medicines to calm yourself (sedatives or tranquilizers).  Having family members with the condition. What are the signs or symptoms?  Trouble staying asleep.  Being sleepy or tired during the day.  Getting angry a lot.  Loud snoring.  Headaches in the morning.  Not being able to focus your mind (concentrate).  Forgetting things.  Less interest in sex.  Mood swings.  Personality changes.  Feelings of sadness (depression).  Waking up a lot during the night to pee (urinate).  Dry mouth.  Sore throat. How is this diagnosed?  Your medical history.  A physical exam.  A test that is done when you are sleeping (sleep study). The test is most often done in a sleep lab but may also be done at home. How is this treated?  Sleeping on your side.  Using a medicine to get rid of mucus in your nose (decongestant).  Avoiding the use of alcohol, medicines to help you relax, or certain pain medicines (narcotics).  Losing weight, if needed.  Changing your diet.  Not smoking.  Using a machine to open your airway while you sleep, such as: ? An oral appliance. This is a mouthpiece that shifts your lower jaw forward. ? A CPAP device. This device blows air through a mask when you breathe out (exhale). ? An EPAP device. This has valves that you put in each nostril. ? A BPAP device. This device blows air through a mask when you breathe in (  inhale) and breathe out.  Having surgery if other treatments do not work. It is important to get treatment for sleep apnea. Without treatment, it can lead to:  High blood pressure.  Coronary artery disease.  In men, not being able to have an erection (impotence).  Reduced thinking ability.   Follow these instructions at home: Lifestyle  Make changes that your doctor recommends.  Eat a healthy diet.  Lose weight if  needed.  Avoid alcohol, medicines to help you relax, and some pain medicines.  Do not use any products that contain nicotine or tobacco, such as cigarettes, e-cigarettes, and chewing tobacco. If you need help quitting, ask your doctor. General instructions  Take over-the-counter and prescription medicines only as told by your doctor.  If you were given a machine to use while you sleep, use it only as told by your doctor.  If you are having surgery, make sure to tell your doctor you have sleep apnea. You may need to bring your device with you.  Keep all follow-up visits as told by your doctor. This is important. Contact a doctor if:  The machine that you were given to use during sleep bothers you or does not seem to be working.  You do not get better.  You get worse. Get help right away if:  Your chest hurts.  You have trouble breathing in enough air.  You have an uncomfortable feeling in your back, arms, or stomach.  You have trouble talking.  One side of your body feels weak.  A part of your face is hanging down. These symptoms may be an emergency. Do not wait to see if the symptoms will go away. Get medical help right away. Call your local emergency services (911 in the [place] Do not drive yourself to the hospital. Summary  This condition affects breathing during sleep.  The most common cause is a collapsed or blocked airway.  The goal of treatment is to help you breathe normally while you sleep. This information is not intended to replace advice given to you by your health care provider. Make sure you discuss any questions you have with your health care provider. Document Revised: 05/29/2018 Document Reviewed: 04/07/2018 Elsevier Patient Education  2021 [place] Disease Parkinson's disease causes problems with movements. It is a long-term condition. It gets worse over time (is progressive). It affects each person in different ways. It makes it  harder for you to:  Control how your body moves.  Move your body normally. The condition can range from mild to very bad (advanced). What are the causes? This condition results from a loss of brain cells called neurons. These brain cells make a chemical called dopamine, which is needed to control body movement. As the condition gets worse, the brain cells make less dopamine. This makes it hard to move or control your movements. The exact cause of this condition is not known. What increases the risk?  Being male.  Being age 58 or older.  Having family members who had Parkinson's disease.  Having had an injury to the brain.  Being very sad (depressed).  Being around things that are harmful or poisonous. What are the signs or symptoms? Symptoms of this condition can vary. The main symptoms have to do with movement. These include:  A tremor or shaking while you are resting that you cannot control.  Stiffness in your neck, arms, and legs.  Slowing of movement. This may include: ? Losing expressions of  the face. ? Having trouble making small movements that are needed to button your clothing or brush your teeth.  Walking in a way that is not normal. You may walk with short, shuffling steps.  Loss of balance when standing. You may sway, fall backward, or have trouble making turns. Other symptoms include:  Being very sad, worried, or confused.  Seeing or hearing things that are not real.  Losing thinking abilities (dementia).  Trouble speaking or swallowing.  Having a hard time pooping (constipation).  Needing to pee right away, peeing often, or not being able to control when you pee or poop.  Sleep problems.   How is this treated? There is no cure. The goal of treatment is to manage your symptoms. Treatment may include:  Medicines.  Therapy to help with talking or movement.  Surgery to reduce shaking and other movements that you cannot control. Follow these  instructions at home: Medicines  Take over-the-counter and prescription medicines only as told by your doctor.  Avoid taking pain or sleeping medicines. Eating and drinking  Follow instructions from your doctor about what you cannot eat or drink.  Do not drink alcohol. Activity  Talk with your doctor about if it is safe for you to drive.  Do exercises as told by your doctor. Lifestyle  Put in grab bars and railings in your home. These help to prevent falls.  Do not use any products that contain nicotine or tobacco, such as cigarettes, e-cigarettes, and chewing tobacco. If you need help quitting, ask your doctor.  Join a support group.      General instructions  Talk with your doctor about what you need help with and what your safety needs are.  Keep all follow-up visits as told by your doctor, including any therapy visits to help with talking or moving. This is important. Contact a doctor if:  Medicines do not help your symptoms.  You feel off-balance.  You fall at home.  You need more help at home.  You have trouble swallowing.  You have a very hard time pooping.  You have a lot of side effects from your medicines.  You feel very sad, worried, or confused. Get help right away if:  You were hurt in a fall.  You see or hear things that are not real.  You cannot swallow without choking.  You have chest pain or trouble breathing.  You do not feel safe at home.  You have thoughts about hurting yourself or others. If you ever feel like you may hurt yourself or others, or have thoughts about taking your own life, get help right away. You can go to your nearest emergency department or call:  Your local emergency services (911 in the [place]  A suicide crisis helpline, such as the [facility] at 601-841-1535. This is open 24 hours a day. Summary  This condition causes problems with movements.  It is a long-term condition. It gets  worse over time.  There is no cure. Treatment focuses on managing your symptoms.  Talk with your doctor about what you need help with and what your safety needs are.  Keep all follow-up visits as told by your doctor. This is important. This information is not intended to replace advice given to you by your health care provider. Make sure you discuss any questions you have with your health care provider. Document Revised: 10/29/2018 Document Reviewed: 10/29/2018 Elsevier Patient Education  [place].

## 2021-01-23 NOTE — Progress Notes (Addendum)
Chief Complaint  Patient presents with   Follow-up    RM 2, with wife [name]. Pt states his CPAP therapies have been helping. States he's getting an error about his mask and has shuts off middle of night.      HISTORY OF PRESENT ILLNESS: 01/25/21 ALL:  [name] is a 85 y.o. male here today for follow up for PD and OSA recently started on AutoPAP. He was last seen in follow up with Dr Rexene Alberts 09/2020 and doing well on carbidopa/levodopa 1 tablet QID. He denies new or worsening symptoms. He is staying active. He has recently returned to the gym and exercising regularly. BP is usually 140's/60-70's at home. Mrs Corman feels like it may be elevated more often than usual. They do have BP cuff at home to monitor.   He is doing fairly well on AutoPAP. He does report feeling better rested and less fatigued on therapy. He has had some difficulty with a high air leak. He is using a nasal pillow and seems to have trouble with mask leaking with position changes. He states that his machine will cut off due to leak in the middle of the night. Otherwise, he is doing very well.     HISTORY (copied from Dr Guadelupe Sabin previous note)  [name] is an 85 year old right-handed gentleman with an underlying complex medical history of peptic ulcer disease with history of upper GI bleed, TIA, depression, degenerative disc disease in the neck, osteoarthritis, kidney stones, hyperlipidemia, hypertension, history of hiatal hernia, BPH, arthritis, and ankylosing spondylitis, and parkinsonism, who presents for follow-up of his sleep disturbance after recent sleep testing.  The patient is accompanied by his wife again today.  I last saw him on 05/10/2020, at which time we went over his sleep study results.  He was agreeable to starting AutoPap therapy.     Today, 10/18/2020: He reports  feeling stable with regards to his parkinsonism.  He tolerates the Sinemet 1 pill 4 times a day and finds it helpful.  He has not had any side  effects.  He has not been set up yet with AutoPap therapy.  He would like to go back to aero care for this.  He would be willing to start a machine other than the ResMed model. He had one fall about a month ago, he tripped over a chair leg.  He bumped his left knee.  He has had some stressors work with his wife's medical condition.  Thankfully, this has been stabilized.   Previously:   I saw him on 04/17/2020, at which time he reported some decline in his mobility.  He was advised to increase C/L to 4 times a day.  His wife reported that he had some irregularities in his breathing while asleep, also some snoring, he reported congestion and feeling of excess mucus.  He was advised to proceed with a sleep study.  He had a baseline sleep study on 04/26/2020 and we reviewed test results today.  Sleep efficiency was reduced at 64%, sleep latency 43.5 minutes, REM latency 209.5 minutes.  He had a significantly reduced percentage of REM sleep at 6.2, he had moderate to severe sleep fragmentation, wake after sleep onset was 140 minutes.  Total AHI was 18.3/h.  Supine AHI was 19.8/h.  Average oxygen saturation was 98%, nadir was 87%.  He had mild PLM's no significant arousals.  He was advised to proceed with treatment at home in the form of AutoPap.  He requested  a follow-up appointment to discuss.    I saw him on 03/24/2019, at which time he reported doing okay with Sinemet, he was taking it 3 times a day.  He had noticed some benefit including improvement in his mobility and balance.  He felt that perhaps his left hand tremor was a little better.   I spoke to him on 12/23/2018 via phone visit, at which time we mutually agreed to start Sinemet with gradual titration.  He had to reschedule his DaTscan secondary to the virus pandemic.  He had his DaTscan in the interim on 01/07/2019 and I reviewed the results: IMPRESSION: Decreased activity within the LEFT and RIGHT posterior striata is a pattern typical of Parkinson's  syndrome pathology.   We called him with his test results.   REVIEW OF SYSTEMS: Out of a complete 14 system review of symptoms, the patient complains only of the following symptoms, fatigue and all other reviewed systems are negative.  ESS: 11 FSS: 35   ALLERGIES: No Known Allergies   HOME MEDICATIONS: Outpatient Medications Prior to Visit  Medication Sig Dispense Refill   aspirin 81 MG tablet Take 81 mg by mouth daily.       atorvastatin (LIPITOR) 10 MG tablet TAKE 1 TABLET BY MOUTH  DAILY 90 tablet 3   carbidopa-levodopa (SINEMET IR) 25-100 MG tablet Take 1 tablet by mouth 4 (four) times daily. 360 tablet 3   Cholecalciferol (VITAMIN D PO) Take 1,000 Units by mouth daily.     cholecalciferol (VITAMIN D3) 25 MCG (1000 UNIT) tablet Take 1 tablet by mouth daily.     losartan-hydrochlorothiazide (HYZAAR) 50-12.5 MG tablet TAKE ONE-HALF TABLET BY  MOUTH DAILY 45 tablet 3   Multiple Vitamin (MULTIVITAMIN WITH MINERALS) TABS tablet Take 1 tablet by mouth daily.     nitroGLYCERIN (NITROSTAT) 0.4 MG SL tablet Place 1 tablet (0.4 mg total) under the tongue every 5 (five) minutes as needed. 25 tablet 6   pantoprazole (PROTONIX) 20 MG tablet Take 20 mg by mouth daily.     Secukinumab (COSENTYX [place]) Inject into the skin.     sulindac (CLINORIL) 150 MG tablet Take 150 mg by mouth daily as needed.     fluticasone (FLONASE) 50 MCG/ACT nasal spray Place 2 sprays into both nostrils daily. 9.9 mL 2   levothyroxine (SYNTHROID, LEVOTHROID) 25 MCG tablet Take 1 tablet (25 mcg total) by mouth daily before breakfast. 90 tablet 3   levothyroxine (SYNTHROID) 50 MCG tablet Take 50 mcg by mouth daily.     No facility-administered medications prior to visit.     PAST MEDICAL HISTORY: Past Medical History:  Diagnosis Date   Ankylosing spondylitis ([place]) dx'd ~ 1974   Arthritis    "back" (08/10/2015)   BENIGN PROSTATIC HYPERTROPHY, WITH OBSTRUCTION 01/15/2010   Bleeding duodenal ulcer    Bleeding  esophageal ulcer    Bleeding stomach ulcer    GERD 02/22/2008   History of blood transfusion 1988   "lost ~ 1/2 of my blood volume from multiple bleeding ulcers"   History of hiatal hernia    HYPERGLYCEMIA 11/18/2007   HYPERLIPIDEMIA 02/22/2008   HYPERTENSION, UNSPECIFIED 11/10/2009   Kidney stones    Osteoarthritis    c-spine   PEPTIC ULCER DISEASE 11/17/2007   Situational depression    "son died in [place] 07-06-2015"   TIA (transient ischemic attack)    "not that I know of in the past; they are trying to determine if I've had one today (08/10/2015)"  PAST SURGICAL HISTORY: Past Surgical History:  Procedure Laterality Date   CATARACT EXTRACTION W/ INTRAOCULAR LENS  IMPLANT, BILATERAL Bilateral 2013     FAMILY HISTORY: Family History  Problem Relation Age of Onset   Appendicitis Mother        complications   Colon cancer Father    Diabetes type II Father    Lupus Sister    Colon cancer Other        1st degree relative <60     SOCIAL HISTORY: Social History   Socioeconomic History   Marital status: Married    Spouse name: Not on file   Number of children: Not on file   Years of education: Not on file   Highest education level: Not on file  Occupational History   Not on file  Tobacco Use   Smoking status: Former Smoker    Years: 15.00    Types: Cigars, Pipe    Quit date: 08/26/1978    Years since quitting: 42.4   Smokeless tobacco: Never Used  Substance and Sexual Activity   Alcohol use: Yes    Comment: 08/10/2015 "glass of wine ~ month or so; occasionally a can of beer"   Drug use: No   Sexual activity: Yes  Other Topics Concern   Not on file  Social History Narrative   Retired and married.    Social Determinants of Health   Financial Resource Strain: Not on file  Food Insecurity: Not on file  Transportation Needs: Not on file  Physical Activity: Not on file  Stress: Not on file  Social Connections: Not on file  Intimate Partner Violence: Not on file       PHYSICAL EXAM  Vitals:   01/25/21 0941  BP: (!) 169/60  Pulse: 84  Weight: 157 lb 3.2 oz (71.3 kg)  Height: 5\' 6"  (1.676 m)   Body mass index is 25.37 kg/m.   Generalized: Well developed, in no acute distress  Cardiology: normal rate and rhythm, no murmur auscultated  Respiratory: clear to auscultation bilaterally    Neurological examination  Mentation: Alert oriented to time, place, history taking. Follows all commands speech and language fluent Cranial nerve II-XII: Pupils were equal round reactive to light. Extraocular movements were full, visual field were full on confrontational test. Facial sensation and strength were normal. Uvula tongue midline. Head turning and shoulder shrug  were normal and symmetric. Motor: The motor testing reveals 5 over 5 strength of all 4 extremities. No tremor noted on exam, today. Gait and station: Gait is slow and cautious but stable without assistive device. Stooped posture     DIAGNOSTIC DATA (LABS, IMAGING, TESTING) - I reviewed patient records, labs, notes, testing and imaging myself where available.  Lab Results  Component Value Date   WBC 5.6 11/04/2017   HGB 12.4 (L) 11/04/2017   HCT 36.0 (L) 11/04/2017   MCV 98.1 11/04/2017   PLT 189.0 11/04/2017      Component Value Date/Time   NA 141 09/16/2018 1115   K 4.9 09/16/2018 1115   CL 102 09/16/2018 1115   CO2 22 09/16/2018 1115   GLUCOSE 98 09/16/2018 1115   GLUCOSE 94 11/04/2017 0934   BUN 25 09/16/2018 1115   CREATININE 1.16 09/16/2018 1115   CREATININE 1.19 (H) 10/21/2016 0953   CALCIUM 10.2 09/16/2018 1115   PROT 6.8 09/16/2018 1115   ALBUMIN 5.0 (H) 09/16/2018 1115   AST 22 09/16/2018 1115   ALT 20 09/16/2018 1115   ALKPHOS 69  09/16/2018 1115   BILITOT 1.0 09/16/2018 1115   GFRNONAA 59 (L) 09/16/2018 1115   GFRAA 68 09/16/2018 1115   Lab Results  Component Value Date   CHOL 118 11/04/2017   HDL 68.70 11/04/2017   LDLCALC 42 11/04/2017   TRIG 35.0  11/04/2017   CHOLHDL 2 11/04/2017   Lab Results  Component Value Date   HGBA1C 5.9 (H) 08/11/2015   Lab Results  Component Value Date   SHFWYOVZ85 885 08/11/2015   Lab Results  Component Value Date   TSH 3.29 12/25/2017    No flowsheet data found.   No flowsheet data found.   ASSESSMENT AND PLAN  85 y.o. year old male  has a past medical history of Ankylosing spondylitis ([place]) (dx'd ~ 1974), Arthritis, BENIGN PROSTATIC HYPERTROPHY, WITH OBSTRUCTION (01/15/2010), Bleeding duodenal ulcer, Bleeding esophageal ulcer, Bleeding stomach ulcer, GERD (02/22/2008), History of blood transfusion (1988), History of hiatal hernia, HYPERGLYCEMIA (11/18/2007), HYPERLIPIDEMIA (02/22/2008), HYPERTENSION, UNSPECIFIED (11/10/2009), Kidney stones, Osteoarthritis, PEPTIC ULCER DISEASE (11/17/2007), Situational depression, and TIA (transient ischemic attack). here with     Primary parkinsonism ([place])  OSA on CPAP - Plan: For home use only DME continuous positive airway pressure (CPAP)  Mr Tipps is doing well, today. He will continue carbidopa/levodopa 25/100mg  1 tablet every 4 hours. Compliance report shows excellent compliance. He does have a significant air leak. I will ask DME to assist with mask refitting and reeducate Mr Trumpower on appropriate fit. He will continue to monitor at home. He was encouraged to continue regular physical and mental activity. Healthy lifestyle habits advised. He will monitor BP closely at home. Fortunately, he is asymptomatic. He was advised to follow up closely with PCP if readings remain elevated. Wife requests follow up with Dr Rexene Alberts in 3 months, last seen in 09/2020. He will call sooner if needed.    Orders Placed This Encounter  Procedures   For home use only DME continuous positive airway pressure (CPAP)    Supplies    Order Specific Question:   Length of Need    Answer:   Lifetime    Order Specific Question:   Patient has OSA or probable OSA    Answer:   Yes    Order  Specific Question:   Is the patient currently using CPAP in the home    Answer:   Yes    Order Specific Question:   Settings    Answer:   Other see comments    Order Specific Question:   CPAP supplies needed    Answer:   Mask, headgear, cushions, filters, heated tubing and water chamber     No orders of the defined types were placed in this encounter.     Debbora Presto, MSN, FNP-C 01/25/2021, 10:11 AM  [facility] [address], [place], [place] [address] [phone]  I reviewed the above note and documentation by the Nurse Practitioner and agree with the history, exam, assessment and plan as outlined above. I was available for consultation. Star Age, MD, PhD [facility])

## 2021-01-25 ENCOUNTER — Encounter: Payer: Self-pay | Admitting: Family Medicine

## 2021-01-25 ENCOUNTER — Ambulatory Visit (INDEPENDENT_AMBULATORY_CARE_PROVIDER_SITE_OTHER): Payer: Medicare Other | Admitting: Family Medicine

## 2021-01-25 VITALS — BP 169/60 | HR 84 | Ht 66.0 in | Wt 157.2 lb

## 2021-01-25 DIAGNOSIS — G4733 Obstructive sleep apnea (adult) (pediatric): Secondary | ICD-10-CM | POA: Diagnosis not present

## 2021-01-25 DIAGNOSIS — Z9989 Dependence on other enabling machines and devices: Secondary | ICD-10-CM | POA: Diagnosis not present

## 2021-01-25 DIAGNOSIS — G20 Parkinson's disease: Secondary | ICD-10-CM

## 2021-01-30 NOTE — Progress Notes (Signed)
I have faxed CPAP order to [facility] and received fax confirmation.

## 2021-02-25 ENCOUNTER — Other Ambulatory Visit: Payer: Self-pay | Admitting: Neurology

## 2021-02-25 DIAGNOSIS — G20 Parkinson's disease: Secondary | ICD-10-CM

## 2021-02-25 DIAGNOSIS — G4752 REM sleep behavior disorder: Secondary | ICD-10-CM

## 2021-03-07 ENCOUNTER — Telehealth: Payer: Self-pay | Admitting: Family Medicine

## 2021-03-07 ENCOUNTER — Other Ambulatory Visit: Payer: Self-pay | Admitting: Neurology

## 2021-03-07 DIAGNOSIS — G4733 Obstructive sleep apnea (adult) (pediatric): Secondary | ICD-10-CM

## 2021-03-07 NOTE — Telephone Encounter (Signed)
Pt called stating he is having issues with his CPAP machine hasn't been able to use it. Pt requesting a call back.

## 2021-03-07 NOTE — Telephone Encounter (Signed)
Called the pt back and he states that he has noticed there are nights where the machine will randomly cut off. He states that he was cleaning the machine and a message appeared high mask leaks. In reviewing the data the patient is experiencing mask leaks. Informed that he should contact [facility] to see if they can schedule an apt where they bring the patient in for a mask refit. Pt verbalized understanding. Provided the number for the patient and advised an order will be forwarded for the patient. Pt verbalized understanding.

## 2021-03-15 ENCOUNTER — Other Ambulatory Visit: Payer: Self-pay | Admitting: Cardiology

## 2021-03-29 DIAGNOSIS — M79676 Pain in unspecified toe(s): Secondary | ICD-10-CM | POA: Diagnosis not present

## 2021-03-29 DIAGNOSIS — B351 Tinea unguium: Secondary | ICD-10-CM | POA: Diagnosis not present

## 2021-05-01 ENCOUNTER — Encounter: Payer: Self-pay | Admitting: Neurology

## 2021-05-03 ENCOUNTER — Ambulatory Visit (INDEPENDENT_AMBULATORY_CARE_PROVIDER_SITE_OTHER): Payer: Medicare Other | Admitting: Neurology

## 2021-05-03 ENCOUNTER — Encounter: Payer: Self-pay | Admitting: Neurology

## 2021-05-03 ENCOUNTER — Other Ambulatory Visit: Payer: Self-pay

## 2021-05-03 VITALS — BP 166/76 | HR 85 | Ht 67.0 in | Wt 153.8 lb

## 2021-05-03 DIAGNOSIS — Z9989 Dependence on other enabling machines and devices: Secondary | ICD-10-CM

## 2021-05-03 DIAGNOSIS — Z789 Other specified health status: Secondary | ICD-10-CM | POA: Diagnosis not present

## 2021-05-03 DIAGNOSIS — G4733 Obstructive sleep apnea (adult) (pediatric): Secondary | ICD-10-CM | POA: Diagnosis not present

## 2021-05-03 DIAGNOSIS — G20 Parkinson's disease: Secondary | ICD-10-CM | POA: Diagnosis not present

## 2021-05-03 NOTE — Progress Notes (Signed)
Subjective:    Patient ID: [name] is a 85 y.o. male.  HPI    Interim history:  Mr. [name] is an 85 year old right-handed gentleman with an underlying complex medical history of peptic ulcer disease with history of upper GI bleed, TIA, depression, degenerative disc disease in the neck, osteoarthritis, kidney stones, hyperlipidemia, hypertension, history of hiatal hernia, BPH, arthritis, and ankylosing spondylitis, and parkinsonism, who presents for follow-up of his OSA and parkinsonism. The patient is accompanied by his wife again today.  I last saw him on 10/18/2020, at which time he reported feeling stable with regards to his parkinsonian symptoms and he was tolerating Sinemet 1 pill 4 times a day, felt that he benefited from it.  He was willing to start AutoPap therapy.  He had an interim appointment with Debbora Presto, NP on 01/25/2021, at which time he was compliant with AutoPap therapy and felt that it was beneficial.   Today, 05/03/21: I reviewed his AutoPap compliance data from 04/02/2021 through 05/01/2021, which is a total of 30 days, during which time he used his machine every night with percent use days greater than 4 hours at 67%, indicating slightly suboptimal compliance with an average usage of 4 hours and 39 minutes, residual AHI elevated at 10.8/h, 95th percentile of pressure at 9.6 cm with a range of 6 to 11 cm, leak rather high consistently with the 95th percentile at 60.1 L/min.  He reports doing fairly well from the Parkinson's standpoint, sometimes his tremor flares up, he knows that it tends to be worse when he is anxious about things.  He is trying to exercise on a regular basis, they have joined a gym that recently opened in their area and they will plan to go about 3 days out of the week.  He is working with a treadmill which is going well.  He is still on Cosentyx.  Constipation is a problem intermittently, generally he has it under control, he tries to hydrate well with water.  He is  still struggling with his AutoPap.  Sometimes the mask is dislodges easily.  The chinstrap did not help.  He has a nasal mask, wife has noticed that it tends to leak consistently the moment he moves.  He has not had a mask refit appointment through his DME provider but would be willing to explore other options.  He is somewhat apprehensive about trying a full facemask but I explained to him that there are different styles for those mouth and nose options as well.  He admits to having fallen a couple of times.  He did not injure himself thankfully.  He does not typically use a walking aid.  He does have some trouble turning.  Previously:   I saw him on 05/10/2020, at which time we went over his sleep study results.  He was agreeable to starting AutoPap therapy.       I saw him on 04/17/2020, at which time he reported some decline in his mobility.  He was advised to increase C/L to 4 times a day.  His wife reported that he had some irregularities in his breathing while asleep, also some snoring, he reported congestion and feeling of excess mucus.  He was advised to proceed with a sleep study.  He had a baseline sleep study on 04/26/2020 and we reviewed test results today.  Sleep efficiency was reduced at 64%, sleep latency 43.5 minutes, REM latency 209.5 minutes.  He had a significantly reduced percentage of REM  sleep at 6.2, he had moderate to severe sleep fragmentation, wake after sleep onset was 140 minutes.  Total AHI was 18.3/h.  Supine AHI was 19.8/h.  Average oxygen saturation was 98%, nadir was 87%.  He had mild PLM's no significant arousals.  He was advised to proceed with treatment at home in the form of AutoPap.  He requested a follow-up appointment to discuss.      I saw him on 03/24/2019, at which time he reported doing okay with Sinemet, he was taking it 3 times a day.  He had noticed some benefit including improvement in his mobility and balance.  He felt that perhaps his left hand tremor was a  little better.     I spoke to him on 12/23/2018 via phone visit, at which time we mutually agreed to start Sinemet with gradual titration.  He had to reschedule his DaTscan secondary to the virus pandemic.  He had his DaTscan in the interim on 01/07/2019 and I reviewed the results: IMPRESSION: Decreased activity within the LEFT and RIGHT posterior striata is a pattern typical of Parkinson's syndrome pathology.   We called him with his test results.       I first met him on 09/16/2018 at the request of his rheumatology PA, at which time he reported a bilateral hand tremor for the past at least one year, left side more noticeable than right. He had also increased difficulty with mobility. He had some findings concerning for parkinsonism. I suggested we proceed with a nuclear medicine DaT scan but this was delayed secondary to the coronavirus pandemic.    09/16/2018: (He) reports bilateral hand tremor, left more than right for the past at least one year with some progression noted. His wife has noted significant impairment in his mobility this time. He has a left more than right tremor, the left hand is the most significant. He has not noticed any lower extremity tremor. He does have restriction in his shoulder mobility, left more than right. He has no family history of Parkinson's disease, does not know much about his mother's history her family history, she died at 68, when he was 85 years old. His paternal grandmother lived to be above 65 years old. He had a sister who died young at 104 from lupus, his brother has significant arthritis issues at age 89. He injured his right ankle and foot when he was a child and continues to have restricted movements in his right ankle. He has fallen a few times. He fell one or 2 times in the last 6 months, one time he fell in the bathroom and one time he fell in the closet. He has had intermittent numbness in both hands, particularly the right hand, particularly with  certain movements or posture, such as while holding the steering wheel.    I reviewed your office note from 07/10/2018, which you kindly included. He has also had some balance issues. For his arthritis he takes Cosentyx injections monthly. Of note, he had a brain MRI without contrast as well as MRA head without contrast on 08/10/2015 and I reviewed the results: IMPRESSION: MRI HEAD IMPRESSION:   Negative brain MRI with no acute intracranial infarct or other process identified.   MRA HEAD IMPRESSION:   Negative intracranial MRA. No large or proximal arterial branch occlusion. No high-grade or correctable stenosis.   He is retired, lives with his wife, he quit smoking and drinks alcohol about once a week, caffeine in the form of coffee,  2-3 cups per day on average.   His Past Medical History Is Significant For: Past Medical History:  Diagnosis Date   Ankylosing spondylitis ([place]) dx'd ~ 1974   Arthritis    "back" (08/10/2015)   BENIGN PROSTATIC HYPERTROPHY, WITH OBSTRUCTION 01/15/2010   Bleeding duodenal ulcer    Bleeding esophageal ulcer    Bleeding stomach ulcer    GERD 02/22/2008   History of blood transfusion 1988   "lost ~ 1/2 of my blood volume from multiple bleeding ulcers"   History of hiatal hernia    HYPERGLYCEMIA 11/18/2007   HYPERLIPIDEMIA 02/22/2008   HYPERTENSION, UNSPECIFIED 11/10/2009   Kidney stones    Osteoarthritis    c-spine   PEPTIC ULCER DISEASE 11/17/2007   Situational depression    "son died in [place] June 25, 2015"   TIA (transient ischemic attack)    "not that I know of in the past; they are trying to determine if I've had one today (08/10/2015)"    His Past Surgical History Is Significant For: Past Surgical History:  Procedure Laterality Date   CATARACT EXTRACTION W/ INTRAOCULAR LENS  IMPLANT, BILATERAL Bilateral 2013    His Family History Is Significant For: Family History  Problem Relation Age of Onset   Appendicitis Mother        complications   Colon  cancer Father    Diabetes type II Father    Lupus Sister    Colon cancer Other        1st degree relative <60   Parkinson's disease Neg Hx     His Social History Is Significant For: Social History   Socioeconomic History   Marital status: Married    Spouse name: Not on file   Number of children: Not on file   Years of education: Not on file   Highest education level: Not on file  Occupational History   Not on file  Tobacco Use   Smoking status: Former    Types: Cigars, Pipe    Quit date: 08/26/1978    Years since quitting: 42.7   Smokeless tobacco: Never  Vaping Use   Vaping Use: Never used  Substance and Sexual Activity   Alcohol use: Yes    Comment: 08/10/2015 "glass of wine ~ month or so; occasionally a can of beer"   Drug use: No   Sexual activity: Yes  Other Topics Concern   Not on file  Social History Narrative   Retired and married.    Social Determinants of Health   Financial Resource Strain: Not on file  Food Insecurity: Not on file  Transportation Needs: Not on file  Physical Activity: Not on file  Stress: Not on file  Social Connections: Not on file    His Allergies Are:  No Known Allergies:   His Current Medications Are:  Outpatient Encounter Medications as of 05/03/2021  Medication Sig   aspirin 81 MG tablet Take 81 mg by mouth daily.     atorvastatin (LIPITOR) 10 MG tablet TAKE 1 TABLET BY MOUTH  DAILY   carbidopa-levodopa (SINEMET IR) 25-100 MG tablet TAKE 1 TABLET BY MOUTH 4  TIMES DAILY   Cholecalciferol (VITAMIN D PO) Take 1,000 Units by mouth daily.   cholecalciferol (VITAMIN D3) 25 MCG (1000 UNIT) tablet Take 1 tablet by mouth daily.   levothyroxine (SYNTHROID) 50 MCG tablet Take 50 mcg by mouth daily.   losartan-hydrochlorothiazide (HYZAAR) 50-12.5 MG tablet TAKE ONE-HALF TABLET BY  MOUTH DAILY   Multiple Vitamin (MULTIVITAMIN WITH MINERALS) TABS tablet  Take 1 tablet by mouth daily.   pantoprazole (PROTONIX) 20 MG tablet Take 20 mg by  mouth daily.   Secukinumab (COSENTYX [place]) Inject into the skin.   sulindac (CLINORIL) 150 MG tablet Take 150 mg by mouth daily as needed.   nitroGLYCERIN (NITROSTAT) 0.4 MG SL tablet Place 1 tablet (0.4 mg total) under the tongue every 5 (five) minutes as needed. (Patient not taking: Reported on 05/03/2021)   No facility-administered encounter medications on file as of 05/03/2021.  :  Review of Systems:  Out of a complete 14 point review of systems, all are reviewed and negative with the exception of these symptoms as listed below:  Review of Systems  Neurological:        Pt is here for parkinson's follow up. Pt states he does have tremors and gait is off also. Pt states tremors are in his left hand. Pt is saying his right foot hurts when he walks on it . Pt states its part of a childhood accident    Objective:  Neurological Exam  Physical Exam Physical Examination:   Vitals:   05/03/21 1025  BP: (!) 166/76  Pulse: 85    General Examination: The patient is a very pleasant 85 y.o. male in no acute distress. He appears well-developed and well-nourished and well groomed.   HEENT: Normocephalic, atraumatic, pupils are equal, round and reactive to light, extraocular tracking is preserved fairly well. He has no nystagmus, hearing is mildly impaired. He has mild facial masking and mild hypophonia, stable. There are no carotid bruits on auscultation. Oropharynx exam reveals: adequate dental hygiene, tongue protrudes centrally and palate elevates symmetrically.  Small airway entry, overall mild airway crowding.   Chest: Clear to auscultation without wheezing, rhonchi or crackles noted.   Heart: S1+S2+0, regular and normal without murmurs, rubs or gallops noted.    Abdomen: Soft, non-tender and non-distended.   Extremities: There is no pitting edema in the distal lower extremities bilaterally.    Skin: Warm and dry without trophic changes noted.   Musculoskeletal: exam reveals lower back  stiffness, reduced ROM in neck, restricted mobility in the left shoulder, arthritic changes in both hands. Restricted mobility in the right ankle from a childhood injury, all appear to be stable.   Neurologically:  Mental status: The patient is awake, alert and oriented in all 4 spheres. His immediate and remote memory, attention, language skills and fund of knowledge are appropriate. There is no evidence of aphasia, agnosia, apraxia or anomia.  Cranial nerves II - XII are as described above under HEENT exam.  Motor exam: no focal atrophy.  Strength is well-preserved, mild increased tone in the left upper extremity only.   (On 09/16/2018: on Archimedes spiral drawing he has moderate difficulty with the left hand, mild difficulty with right hand. Handwriting with his dominant, right hand is somewhat difficult to read, on the smaller side, not particularly tremulous.)   He has a mild but intermittent resting tremor in the left hand. He has no other resting tremor.  Fine motor skills are mildly impaired globally, more so on the left side. Cerebellar testing: No dysmetria or intention tremor.  Sensory exam: intact to light touch.  Gait, station and balance: He stands with difficulty. No veering to one side is noted. No leaning to one side is noted. Posture is moderately stooped with increase in upper back kyphosis noted. He walks without a walking aid, slowly and cautiously.  Slightly more insecure walking today.  No freezing.  No obvious shuffling, but has smaller steps.   Assessment and Plan:    In summary, PAARTH CROPPER is an 85 year old male with an underlying complex medical history of peptic ulcer disease with history of upper GI bleed, TIA, depression, degenerative disc disease in the neck, osteoarthritis, kidney stones, hyperlipidemia, hypertension, history of hiatal hernia, BPH, arthritis, including ankylosing spondylitis (followed by rheumatology), and parkinsonism, with left-sided predominance  noted, who presents for follow-up consultation of his parkinsonism as well as sleep apnea.  His DaTscan from 01/07/2019 was supportive for an underlying parkinsonian syndrome.  His diagnostic sleep study on 04/26/2020 showed moderate obstructive sleep apnea by a number of events with an AHI of 18.3/h, but milder desaturations, O2 nadir of 87%. He has been on AutoPap therapy since 12/07/2020 and is generally compliant with treatment, however, his AHI is suboptimal.  In the past 90 days his AHI has been around 9.5/h, leak has been consistently high.  He has been using a nasal mask.  I requested a mask fit appointment through his DME provider.  He is agreeable to trying a different style of mask, a chinstrap did not help.  He is commended for his treatment adherence.  From the Parkinson's standpoint he is advised to continue with Sinemet 1 pill 4 times daily.  He did not need a refill today.  We talked about the importance of fall prevention.  We talked about the importance of maintaining a healthy lifestyle, exercising on a regular basis, staying well-hydrated and being proactive about constipation control.  He is advised to follow-up to see Debbora Presto, NP in about 3 to 4 months and I will see him in my clinic in about 6 months.  I answered all the questions today and the patient and his wife were in agreement. I spent 40 minutes in total face-to-face time and in reviewing records during pre-charting, more than 50% of which was spent in counseling and coordination of care, reviewing test results, reviewing medications and treatment regimen and/or in discussing or reviewing the diagnosis of PD, OSA, the prognosis and treatment options. Pertinent laboratory and imaging test results that were available during this visit with the patient were reviewed by me and considered in my medical decision making (see chart for details).

## 2021-05-03 NOTE — Progress Notes (Signed)
Fax confirmation received for new cpap orders to [facility] [phone].

## 2021-05-03 NOTE — Patient Instructions (Signed)
It was nice to see you both again today. I think it is a good idea for you to join the gym and work on mild cardio and resistance training. Please continue to stay well-hydrated.  We will keep your Sinemet the same at 1 pill 4 times a day, your prescription is up-to-date. Please be mindful of your fall risk, stand up slowly and get your bearings first.  Please turn slowly and be especially careful in the kitchen and at night, try to have some night lights to you can see better especially if you have to get up to use the bathroom at night. You are compliant with your AutoPap machine but the leak from the mask is too high.  I have requested a mask refit appointment through your DME company, [facility]. Please follow-up to see Debbora Presto, NP in about 3 to 4 months and I will see you in about 6 months from now.

## 2021-05-13 DIAGNOSIS — X58XXXA Exposure to other specified factors, initial encounter: Secondary | ICD-10-CM | POA: Diagnosis not present

## 2021-05-13 DIAGNOSIS — S61411A Laceration without foreign body of right hand, initial encounter: Secondary | ICD-10-CM | POA: Diagnosis not present

## 2021-05-14 DIAGNOSIS — Z79899 Other long term (current) drug therapy: Secondary | ICD-10-CM | POA: Diagnosis not present

## 2021-05-14 DIAGNOSIS — M545 Low back pain, unspecified: Secondary | ICD-10-CM | POA: Diagnosis not present

## 2021-05-14 DIAGNOSIS — M150 Primary generalized (osteo)arthritis: Secondary | ICD-10-CM | POA: Diagnosis not present

## 2021-05-14 DIAGNOSIS — Z6824 Body mass index (BMI) 24.0-24.9, adult: Secondary | ICD-10-CM | POA: Diagnosis not present

## 2021-05-14 DIAGNOSIS — M459 Ankylosing spondylitis of unspecified sites in spine: Secondary | ICD-10-CM | POA: Diagnosis not present

## 2021-05-14 DIAGNOSIS — M542 Cervicalgia: Secondary | ICD-10-CM | POA: Diagnosis not present

## 2021-05-14 DIAGNOSIS — M2550 Pain in unspecified joint: Secondary | ICD-10-CM | POA: Diagnosis not present

## 2021-05-17 DIAGNOSIS — L578 Other skin changes due to chronic exposure to nonionizing radiation: Secondary | ICD-10-CM | POA: Diagnosis not present

## 2021-05-17 DIAGNOSIS — L218 Other seborrheic dermatitis: Secondary | ICD-10-CM | POA: Diagnosis not present

## 2021-05-17 DIAGNOSIS — L570 Actinic keratosis: Secondary | ICD-10-CM | POA: Diagnosis not present

## 2021-06-01 DIAGNOSIS — Z23 Encounter for immunization: Secondary | ICD-10-CM | POA: Diagnosis not present

## 2021-06-05 ENCOUNTER — Other Ambulatory Visit: Payer: Self-pay | Admitting: Cardiology

## 2021-06-07 DIAGNOSIS — M79676 Pain in unspecified toe(s): Secondary | ICD-10-CM | POA: Diagnosis not present

## 2021-06-07 DIAGNOSIS — B351 Tinea unguium: Secondary | ICD-10-CM | POA: Diagnosis not present

## 2021-06-19 DIAGNOSIS — Z23 Encounter for immunization: Secondary | ICD-10-CM | POA: Diagnosis not present

## 2021-07-31 ENCOUNTER — Telehealth: Payer: Self-pay | Admitting: Neurology

## 2021-07-31 NOTE — Telephone Encounter (Signed)
FYI- Pt called state he is having a procedure done on his scalp therefore he will not be using his CPAP for a few weeks. His procedure is scheduled for this Thursday and he will stop using his machine today because he does not want the strap to irritate the area of is scalp. Pt states he will call back once he starts back using his CPAP machine.

## 2021-08-01 NOTE — Telephone Encounter (Signed)
Noted, thank you

## 2021-08-02 DIAGNOSIS — L570 Actinic keratosis: Secondary | ICD-10-CM | POA: Diagnosis not present

## 2021-08-16 ENCOUNTER — Ambulatory Visit: Payer: Medicare Other | Admitting: Family Medicine

## 2021-08-23 DIAGNOSIS — B351 Tinea unguium: Secondary | ICD-10-CM | POA: Diagnosis not present

## 2021-08-23 DIAGNOSIS — M79676 Pain in unspecified toe(s): Secondary | ICD-10-CM | POA: Diagnosis not present

## 2021-09-10 DIAGNOSIS — L821 Other seborrheic keratosis: Secondary | ICD-10-CM | POA: Diagnosis not present

## 2021-09-10 DIAGNOSIS — L819 Disorder of pigmentation, unspecified: Secondary | ICD-10-CM | POA: Diagnosis not present

## 2021-09-10 DIAGNOSIS — L570 Actinic keratosis: Secondary | ICD-10-CM | POA: Diagnosis not present

## 2021-09-10 DIAGNOSIS — L708 Other acne: Secondary | ICD-10-CM | POA: Diagnosis not present

## 2021-09-26 ENCOUNTER — Ambulatory Visit (INDEPENDENT_AMBULATORY_CARE_PROVIDER_SITE_OTHER): Payer: Medicare Other | Admitting: Neurology

## 2021-09-26 VITALS — BP 168/72 | HR 82 | Ht 68.0 in | Wt 158.2 lb

## 2021-09-26 DIAGNOSIS — Z9989 Dependence on other enabling machines and devices: Secondary | ICD-10-CM | POA: Diagnosis not present

## 2021-09-26 DIAGNOSIS — G4733 Obstructive sleep apnea (adult) (pediatric): Secondary | ICD-10-CM

## 2021-09-26 DIAGNOSIS — G20 Parkinson's disease: Secondary | ICD-10-CM

## 2021-09-26 DIAGNOSIS — G4752 REM sleep behavior disorder: Secondary | ICD-10-CM

## 2021-09-26 MED ORDER — CARBIDOPA-LEVODOPA 25-100 MG PO TABS: 1.0000 | ORAL_TABLET | Freq: Four times a day (QID) | ORAL | 3 refills | Status: DC

## 2021-09-26 MED ORDER — CARBIDOPA-LEVODOPA ER 50-200 MG PO TBCR: 1.0000 | EXTENDED_RELEASE_TABLET | Freq: Every day | ORAL | 3 refills | Status: DC

## 2021-09-26 NOTE — Progress Notes (Signed)
Subjective:    Patient ID: [name] is a 86 y.o. male.  HPI    Star Age, MD, PhD [facility] [address] [place], [place] [address]  [name] is an 86 year old right-handed gentleman with an underlying complex medical history of peptic ulcer disease with history of upper GI bleed, TIA, depression, degenerative disc disease in the neck, osteoarthritis, kidney stones, hyperlipidemia, hypertension, history of hiatal hernia, BPH, arthritis, and ankylosing spondylitis, and parkinsonism, who presents for follow-up of [name] OSA and parkinsonism. The patient is accompanied by [name] wife again today.  I last saw [name] on 05/03/21, at which time [name] was slightly suboptimal with [name] AutoPap.  [name] AHI was also elevated around 10/h.  [name] was advised to make an appointment with [name] DME provider for mask refit as [name] leak was also high.  We talked about the importance of staying active and about fall prevention.  [name] was advised to continue with Sinemet 1 pill 4 times daily.  Today, 09/26/2021: [name] reports that [name] tremor is a little worse.  [name] has had a couple of falls, [name] fell shortly after our appointment in September, [name] reports that [name] bumped [name] right hip and skinned [name] right hand, [name] went to urgent care and needed laceration repaired on [name] hand.  [name] has medicine.  [name] has joined a class once a week and also exercises on [name] own.  [name] has had some freezing spells.  [name] takes [name] medication for the first time around 6 AM and then approximately around 3 PM and sometimes at bedtime.  [name] has had some foot numbness.  [name] has had constipation intermittently but not severe.  I reviewed [name] AutoPap compliance data from 08/26/2021 through 09/24/2021, total of 30 days, during which time [name] used [name] machine every night with percent use days greater than 4 hours at 73%, indicating adequate compliance, average usage of 4 hours and 43 minutes, residual AHI elevated at 18.5 with no obvious  significant central component.  Average pressure for the 95th percentile at 7.7 cm with a very high leak consistently at 108 L/min, pressure range of 6 to 11 cm with EPR.    Previously:   I saw [name] on 10/18/2020, at which time [name] reported feeling stable with regards to [name] parkinsonian symptoms and [name] was tolerating Sinemet 1 pill 4 times a day, felt that [name] benefited from it.  [name] was willing to start AutoPap therapy.   [name] had an interim appointment with Debbora Presto, NP on 01/25/2021, at which time [name] was compliant with AutoPap therapy and felt that it was beneficial.   I reviewed [name] AutoPap compliance data from 04/02/2021 through 05/01/2021, which is a total of 30 days, during which time [name] used [name] machine every night with percent use days greater than 4 hours at 67%, indicating slightly suboptimal compliance with an average usage of 4 hours and 39 minutes, residual AHI elevated at 10.8/h, 95th percentile of pressure at 9.6 cm with a range of 6 to 11 cm, leak rather high consistently with the 95th percentile at 60.1 L/min.   I saw [name] on 05/10/2020, at which time we went over [name] sleep study results.  [name] was agreeable to starting AutoPap therapy.       I saw [name] on 04/17/2020, at which time [name] reported some decline in [name] mobility.  [name] was advised to increase C/L to 4 times a day.  [name] wife reported that  [name] had some irregularities in [name] breathing while asleep, also some snoring, [name] reported congestion and feeling of excess mucus.  [name] was advised to proceed with a sleep study.  [name] had a baseline sleep study on 04/26/2020 and we reviewed test results today.  Sleep efficiency was reduced at 64%, sleep latency 43.5 minutes, REM latency 209.5 minutes.  [name] had a significantly reduced percentage of REM sleep at 6.2, [name] had moderate to severe sleep fragmentation, wake after sleep onset was 140 minutes.  Total AHI was 18.3/h.  Supine AHI was 19.8/h.  Average oxygen saturation was 98%, nadir was 87%.  [name] had mild PLM's no  significant arousals.  [name] was advised to proceed with treatment at home in the form of AutoPap.  [name] requested a follow-up appointment to discuss.      I saw [name] on 03/24/2019, at which time [name] reported doing okay with Sinemet, [name] was taking it 3 times a day.  [name] had noticed some benefit including improvement in [name] mobility and balance.  [name] felt that perhaps [name] left hand tremor was a little better.     I spoke to [name] on 12/23/2018 via phone visit, at which time we mutually agreed to start Sinemet with gradual titration.  [name] had to reschedule [name] DaTscan secondary to the virus pandemic.  [name] had [name] DaTscan in the interim on 01/07/2019 and I reviewed the results: IMPRESSION: Decreased activity within the LEFT and RIGHT posterior striata is a pattern typical of Parkinson's syndrome pathology.   We called [name] with [name] test results.       I first met [name] on 09/16/2018 at the request of [name] rheumatology PA, at which time [name] reported a bilateral hand tremor for the past at least one year, left side more noticeable than right. [name] had also increased difficulty with mobility. [name] had some findings concerning for parkinsonism. I suggested we proceed with a nuclear medicine DaT scan but this was delayed secondary to the coronavirus pandemic.    09/16/2018: ([name]) reports bilateral hand tremor, left more than right for the past at least one year with some progression noted. [name] wife has noted significant impairment in [name] mobility this time. [name] has a left more than right tremor, the left hand is the most significant. [name] has not noticed any lower extremity tremor. [name] does have restriction in [name] shoulder mobility, left more than right. [name] has no family history of Parkinson's disease, does not know much about [name] mother's history her family history, she died at 39, when [name] was 86 years old. [name] paternal grandmother lived to be above 52 years old. [name] had a sister who died young at 38 from lupus, [name] brother has  significant arthritis issues at age 87. [name] injured [name] right ankle and foot when [name] was a child and continues to have restricted movements in [name] right ankle. [name] has fallen a few times. [name] fell one or 2 times in the last 6 months, one time [name] fell in the bathroom and one time [name] fell in the closet. [name] has had intermittent numbness in both hands, particularly the right hand, particularly with certain movements or posture, such as while holding the steering wheel.    I reviewed your office note from 07/10/2018, which you kindly included. [name] has also had some balance issues. For [name] arthritis [name] takes Cosentyx injections monthly. Of note, [name] had a brain MRI without contrast as well as MRA head without contrast on 08/10/2015 and  I reviewed the results: IMPRESSION: MRI HEAD IMPRESSION:   Negative brain MRI with no acute intracranial infarct or other process identified.   MRA HEAD IMPRESSION:   Negative intracranial MRA. No large or proximal arterial branch occlusion. No high-grade or correctable stenosis.   [name] is retired, lives with [name] wife, [name] quit smoking and drinks alcohol about once a week, caffeine in the form of coffee, 2-3 cups per day on average.    [name] Past Medical History Is Significant For: Past Medical History:  Diagnosis Date   Ankylosing spondylitis ([place]) dx'd ~ 1974   Arthritis    "back" (08/10/2015)   BENIGN PROSTATIC HYPERTROPHY, WITH OBSTRUCTION 01/15/2010   Bleeding duodenal ulcer    Bleeding esophageal ulcer    Bleeding stomach ulcer    GERD 02/22/2008   History of blood transfusion 1988   "lost ~ 1/2 of my blood volume from multiple bleeding ulcers"   History of hiatal hernia    HYPERGLYCEMIA 11/18/2007   HYPERLIPIDEMIA 02/22/2008   HYPERTENSION, UNSPECIFIED 11/10/2009   Kidney stones    Osteoarthritis    c-spine   PEPTIC ULCER DISEASE 11/17/2007   Situational depression    "son died in [place] 2015/06/07"   TIA (transient ischemic attack)    "not that I know of in the  past; they are trying to determine if I've had one today (08/10/2015)"    [name] Past Surgical History Is Significant For: Past Surgical History:  Procedure Laterality Date   CATARACT EXTRACTION W/ INTRAOCULAR LENS  IMPLANT, BILATERAL Bilateral 2013    [name] Family History Is Significant For: Family History  Problem Relation Age of Onset   Appendicitis Mother        complications   Colon cancer Father    Diabetes type II Father    Lupus Sister    Colon cancer Other        1st degree relative <60   Parkinson's disease Neg Hx     [name] Social History Is Significant For: Social History   Socioeconomic History   Marital status: Married    Spouse name: Not on file   Number of children: Not on file   Years of education: Not on file   Highest education level: Not on file  Occupational History   Not on file  Tobacco Use   Smoking status: Former    Types: Cigars, Pipe    Quit date: 08/26/1978    Years since quitting: 43.1   Smokeless tobacco: Never  Vaping Use   Vaping Use: Never used  Substance and Sexual Activity   Alcohol use: Yes    Comment: 08/10/2015 "glass of wine ~ month or so; occasionally a can of beer"   Drug use: No   Sexual activity: Yes  Other Topics Concern   Not on file  Social History Narrative   Retired and married.    Social Determinants of Health   Financial Resource Strain: Not on file  Food Insecurity: Not on file  Transportation Needs: Not on file  Physical Activity: Not on file  Stress: Not on file  Social Connections: Not on file    [name] Allergies Are:  No Known Allergies:   [name] Current Medications Are:  Outpatient Encounter Medications as of 09/26/2021  Medication Sig   aspirin 81 MG tablet Take 81 mg by mouth daily.     atorvastatin (LIPITOR) 10 MG tablet TAKE 1 TABLET BY MOUTH  DAILY   carbidopa-levodopa (SINEMET IR) 25-100 MG tablet TAKE 1 TABLET  BY MOUTH 4  TIMES DAILY   cholecalciferol (VITAMIN D3) 25 MCG (1000 UNIT) tablet Take 1 tablet  by mouth daily.   levothyroxine (SYNTHROID) 50 MCG tablet Take 50 mcg by mouth daily.   losartan-hydrochlorothiazide (HYZAAR) 50-12.5 MG tablet TAKE ONE-HALF TABLET BY  MOUTH DAILY   Multiple Vitamin (MULTIVITAMIN WITH MINERALS) TABS tablet Take 1 tablet by mouth daily.   nitroGLYCERIN (NITROSTAT) 0.4 MG SL tablet Place 1 tablet (0.4 mg total) under the tongue every 5 (five) minutes as needed.   pantoprazole (PROTONIX) 20 MG tablet Take 20 mg by mouth daily.   Secukinumab (COSENTYX [place]) Inject into the skin.   sulindac (CLINORIL) 150 MG tablet Take 150 mg by mouth daily as needed.   [DISCONTINUED] Cholecalciferol (VITAMIN D PO) Take 1,000 Units by mouth daily.   No facility-administered encounter medications on file as of 09/26/2021.  :   Review of Systems:  Out of a complete 14 point review of systems, all are reviewed and negative with the exception of these symptoms as listed below:  Review of Systems  Neurological:        Tremors worse.  Has had 2 falls, (one landed on R hip and R ahnd.  R foot Leg/ foot numbness/neuropathy. Has a hardtime getting up.     Objective:  Neurological Exam  Physical Exam Physical Examination:   Vitals:   09/26/21 1328  BP: (!) 168/72  Pulse: 82    General Examination: The patient is a very pleasant 86 y.o. male in no acute distress. [name] appears well-developed and well-nourished and well groomed.   HEENT: Normocephalic, atraumatic, pupils are equal, round and reactive to light, extraocular tracking is preserved fairly well. [name] has no nystagmus, hearing is mildly impaired. [name] has mild facial masking and mild hypophonia, stable. There are no carotid bruits on auscultation. Oropharynx exam reveals: Mild to moderate mouth dryness, adequate dental hygiene, tongue protrudes centrally and palate elevates symmetrically.  Small airway entry, overall mild airway crowding.   Chest: Clear to auscultation without wheezing, rhonchi or crackles noted.   Heart:  S1+S2+0, regular and normal without murmurs, rubs or gallops noted.    Abdomen: Soft, non-tender and non-distended.   Extremities: There is no pitting edema in the distal lower extremities bilaterally.    Skin: Warm and dry without trophic changes noted.   Musculoskeletal: exam reveals lower back stiffness, reduced ROM in neck, restricted mobility in the left shoulder, arthritic changes in both hands. Restricted mobility in the right ankle from a childhood injury, all appear to be stable.   Neurologically:  Mental status: The patient is awake, alert and oriented in all 4 spheres. [name] immediate and remote memory, attention, language skills and fund of knowledge are appropriate. There is no evidence of aphasia, agnosia, apraxia or anomia.  Cranial nerves II - XII are as described above under HEENT exam.  Motor exam: no focal atrophy.  Strength is well-preserved, mild increased tone in the left upper extremity only.   (On 09/16/2018: on Archimedes spiral drawing [name] has moderate difficulty with the left hand, mild difficulty with right hand. Handwriting with [name] dominant, right hand is somewhat difficult to read, on the smaller side, not particularly tremulous.)   [name] has a mild but intermittent resting tremor in the left hand. [name] has no other resting tremor.  Fine motor skills are mild to moderately impaired, more pronounced on the left.  Cerebellar testing: No dysmetria or intention tremor.  Sensory exam: intact to light touch.  Gait, station and balance: [name] stands with difficulty. No veering to one side is noted. No leaning to one side is noted. Posture is moderately stooped with increase in upper back kyphosis noted. [name] walks without a walking aid, slowly and cautiously.  Balance is mildly impaired. Assessment and Plan:    In summary, [name] is an 86 year old male with an underlying complex medical history of peptic ulcer disease with history of upper GI bleed, TIA, depression,  degenerative disc disease in the neck, osteoarthritis, kidney stones, hyperlipidemia, hypertension, history of hiatal hernia, BPH, arthritis, including ankylosing spondylitis (followed by rheumatology), and parkinsonism,with left-sided predominance noted, who presents for follow-up consultation of [name] parkinsonism as well as sleep apnea.  [name] DaTscan from 01/07/2019 was supportive for an underlying parkinsonian syndrome.  [name] diagnostic sleep study on 04/26/2020 showed moderate obstructive sleep apnea by a number of events with an AHI of 18.3/h, but milder desaturations, O2 nadir of 87%. [name] has been on AutoPap therapy since 12/07/2020 and is generally compliant with treatment, however, [name] AHI is suboptimal.  In the past 90 days [name] AHI has been around 9.5/h, leak has been consistently high.  [name] has been using a nasal mask.  I requested a mask fit appointment through [name] DME provider.  [name] is agreeable to trying a different style of mask, a chinstrap did not help.  [name] is commended for [name] treatment adherence.  From the Parkinson's standpoint [name] is advised to continue with Sinemet 1 pill 4 times daily.  [name] would like to add a long-acting Sinemet at bedtime, Sinemet CR generic 50-200 mg, around 10 PM [name] did not need a refill today.  We talked about the importance of fall prevention and the importance of maintaining a healthy lifestyle, exercising on a regular basis, staying well-hydrated and being proactive about constipation control.  [name] is advised to stay a little bit better hydrated with water.  [name] has been exercising on a regular basis.  [name] is advised to continue with [name] AutoPap.  Follow-up in this clinic in about 3 to 4 months.  I provided written instructions and a new prescription for the Sinemet CR as well as a refill for the generic Sinemet IR.  I answered all their questions today and they were in agreement. I spent 30 minutes in total face-to-face time and in reviewing records during pre-charting, more than  50% of which was spent in counseling and coordination of care, reviewing test results, reviewing medications and treatment regimen and/or in discussing or reviewing the diagnosis of PD, OSA, the prognosis and treatment options. Pertinent laboratory and imaging test results that were available during this visit with the patient were reviewed by me and considered in my medical decision making (see chart for details).

## 2021-09-26 NOTE — Patient Instructions (Signed)
Please continue with generic Sinemet 25-100 mg 4 times a day at 6 AM, 10 AM, 2 PM and 6 PM daily.   We will add a long-acting Sinemet 50-200 mg strength at bedtime around 10 PM daily.  Please try to stay well-hydrated with water and exercise on a regular basis, continue with your AutoPap.  You will likely need a different or new mask from the mask is very high.  Please contact your DME provider.

## 2021-10-01 ENCOUNTER — Encounter: Payer: Self-pay | Admitting: Neurology

## 2021-10-29 ENCOUNTER — Telehealth: Payer: Self-pay | Admitting: Neurology

## 2021-10-29 NOTE — Telephone Encounter (Signed)
Please ask him to discontinue the long-acting Sinemet 50-200 mg strength and maintain his regimen on the immediate release for now.  We may try the long-acting at a lower dose in the future but for now we will just have him stop it due to side effects. ?

## 2021-10-29 NOTE — Telephone Encounter (Signed)
Need to discuss side effect of nausea from taking Carbidopa/Levodopa 50-200. Pt would like a call from the nurse. ?

## 2021-10-29 NOTE — Telephone Encounter (Signed)
I returned the patient's call.  He is concerned about nausea and dizziness that has been bothersome since he started the carbidopa/levodopa 50-200 mg tablet.  He states the dizziness may be associated with standing too fast but it is more-so a constant feeling.  He feels nauseous even now.  Last night he skipped the fourth IR dose but took the bedtime 50-200 mg dose.  He would like to see what Dr. Rexene Alberts recommends as yesterday he did not even feel like going to church.  He said he may have noticed dizziness or nausea on occasion with being strictly on the IR regimen, but nothing that affected him to this degree since starting the 50-200 mg.  I let him I would call back as soon as I have a recommendation from Dr Rexene Alberts.  ?

## 2021-10-29 NOTE — Telephone Encounter (Signed)
Spoke with patient and advised, per Dr. Rexene Alberts, to discontinue the long-acting Sinemet 50-200 mg strength and maintain his regimen on the immediate release 25-100 mg tablet 4 times a day for now.  Patient aware that Dr Rexene Alberts might have him try the long-acting at a lower dose in the future but for now we will have him stop it due to side effects.  His questions were answered.  He also mentioned he has been experiencing some anxiety for the past month.  I advised him to speak with primary care, Dr. Philip Aspen, about the anxiety.  Also asked him to follow-up if stopping the long-acting Sinemet does not improve his dizziness and nausea. ?

## 2021-11-06 ENCOUNTER — Ambulatory Visit: Payer: Medicare Other | Admitting: Neurology

## 2021-11-13 DIAGNOSIS — M545 Low back pain, unspecified: Secondary | ICD-10-CM | POA: Diagnosis not present

## 2021-11-13 DIAGNOSIS — M1991 Primary osteoarthritis, unspecified site: Secondary | ICD-10-CM | POA: Diagnosis not present

## 2021-11-13 DIAGNOSIS — Z125 Encounter for screening for malignant neoplasm of prostate: Secondary | ICD-10-CM | POA: Diagnosis not present

## 2021-11-13 DIAGNOSIS — E785 Hyperlipidemia, unspecified: Secondary | ICD-10-CM | POA: Diagnosis not present

## 2021-11-13 DIAGNOSIS — R5383 Other fatigue: Secondary | ICD-10-CM | POA: Diagnosis not present

## 2021-11-13 DIAGNOSIS — M542 Cervicalgia: Secondary | ICD-10-CM | POA: Diagnosis not present

## 2021-11-13 DIAGNOSIS — I10 Essential (primary) hypertension: Secondary | ICD-10-CM | POA: Diagnosis not present

## 2021-11-13 DIAGNOSIS — Z79899 Other long term (current) drug therapy: Secondary | ICD-10-CM | POA: Diagnosis not present

## 2021-11-13 DIAGNOSIS — M459 Ankylosing spondylitis of unspecified sites in spine: Secondary | ICD-10-CM | POA: Diagnosis not present

## 2021-11-13 DIAGNOSIS — Z6824 Body mass index (BMI) 24.0-24.9, adult: Secondary | ICD-10-CM | POA: Diagnosis not present

## 2021-11-13 DIAGNOSIS — E039 Hypothyroidism, unspecified: Secondary | ICD-10-CM | POA: Diagnosis not present

## 2021-11-15 DIAGNOSIS — B351 Tinea unguium: Secondary | ICD-10-CM | POA: Diagnosis not present

## 2021-11-15 DIAGNOSIS — M79676 Pain in unspecified toe(s): Secondary | ICD-10-CM | POA: Diagnosis not present

## 2021-11-20 DIAGNOSIS — E785 Hyperlipidemia, unspecified: Secondary | ICD-10-CM | POA: Diagnosis not present

## 2021-11-20 DIAGNOSIS — G609 Hereditary and idiopathic neuropathy, unspecified: Secondary | ICD-10-CM | POA: Diagnosis not present

## 2021-11-20 DIAGNOSIS — Z1331 Encounter for screening for depression: Secondary | ICD-10-CM | POA: Diagnosis not present

## 2021-11-20 DIAGNOSIS — G20 Parkinson's disease: Secondary | ICD-10-CM | POA: Diagnosis not present

## 2021-11-20 DIAGNOSIS — N1831 Chronic kidney disease, stage 3a: Secondary | ICD-10-CM | POA: Diagnosis not present

## 2021-11-20 DIAGNOSIS — E039 Hypothyroidism, unspecified: Secondary | ICD-10-CM | POA: Diagnosis not present

## 2021-11-20 DIAGNOSIS — R82998 Other abnormal findings in urine: Secondary | ICD-10-CM | POA: Diagnosis not present

## 2021-11-20 DIAGNOSIS — I129 Hypertensive chronic kidney disease with stage 1 through stage 4 chronic kidney disease, or unspecified chronic kidney disease: Secondary | ICD-10-CM | POA: Diagnosis not present

## 2021-11-20 DIAGNOSIS — K219 Gastro-esophageal reflux disease without esophagitis: Secondary | ICD-10-CM | POA: Diagnosis not present

## 2021-11-20 DIAGNOSIS — Z1339 Encounter for screening examination for other mental health and behavioral disorders: Secondary | ICD-10-CM | POA: Diagnosis not present

## 2021-11-20 DIAGNOSIS — Z0000 Encounter for general adult medical examination without abnormal findings: Secondary | ICD-10-CM | POA: Diagnosis not present

## 2021-12-04 DIAGNOSIS — M459 Ankylosing spondylitis of unspecified sites in spine: Secondary | ICD-10-CM | POA: Diagnosis not present

## 2021-12-04 DIAGNOSIS — Z79899 Other long term (current) drug therapy: Secondary | ICD-10-CM | POA: Diagnosis not present

## 2021-12-04 DIAGNOSIS — R5383 Other fatigue: Secondary | ICD-10-CM | POA: Diagnosis not present

## 2021-12-18 NOTE — Progress Notes (Signed)
? ? ? ?CARDIOLOGY OFFICE NOTE ? ?Date:  12/20/2021  ? ? ?[name] ?Date of Birth: 1936/03/29 ?Medical Record #[id number] ? ?PCP:  Donnajean Lopes, MD  ?Cardiologist:  Soumya Colson Martinique MD  ? ?Chief Complaint  ?Patient presents with  ? Hypertension  ? Hyperlipidemia  ? ? ?History of Present Illness: ?[name] is a 86 y.o. male who presents today for cardiac follow up.   He has a history of hyperlipidemia, HTN, and GERD. He has no known CAD. Negative stress echo in 2011. Does have known carotid disease -  study in 07/2014 with 40 to 59% RICA stenosis. Admitted in December 2016 with possible TIA. Echo was unremarkable. Carotid dopplers then showed only mild disease.  ? ?On follow up today he states he is doing well.   He has  Parkinson's disease and ankylosing spondilitis is on Cosentyx.  And Sinemet. No chest pain or dyspnea. He does walk regularly when the weather is good. Goes to the gym and doing balance exercises.  ? ? ?Past Medical History:  ?Diagnosis Date  ? Ankylosing spondylitis ([place]) dx'd ~ 1974  ? Arthritis   ? "back" (08/10/2015)  ? BENIGN PROSTATIC HYPERTROPHY, WITH OBSTRUCTION 01/15/2010  ? Bleeding duodenal ulcer   ? Bleeding esophageal ulcer   ? Bleeding stomach ulcer   ? GERD 02/22/2008  ? History of blood transfusion 1988  ? "lost ~ 1/2 of my blood volume from multiple bleeding ulcers"  ? History of hiatal hernia   ? HYPERGLYCEMIA 11/18/2007  ? HYPERLIPIDEMIA 02/22/2008  ? HYPERTENSION, UNSPECIFIED 11/10/2009  ? Kidney stones   ? Osteoarthritis   ? c-spine  ? PEPTIC ULCER DISEASE 11/17/2007  ? Situational depression   ? "son died in [place] 2015-06-20"  ? TIA (transient ischemic attack)   ? "not that I know of in the past; they are trying to determine if I've had one today (08/10/2015)"  ? ? ?Past Surgical History:  ?Procedure Laterality Date  ? CATARACT EXTRACTION W/ INTRAOCULAR LENS  IMPLANT, BILATERAL Bilateral 2013  ? ? ? ?Medications: ?Current Outpatient Medications  ?Medication Sig Dispense Refill  ?  aspirin 81 MG tablet Take 81 mg by mouth daily.      ? atorvastatin (LIPITOR) 10 MG tablet TAKE 1 TABLET BY MOUTH  DAILY 90 tablet 3  ? carbidopa-levodopa (SINEMET IR) 25-100 MG tablet Take 1 tablet by mouth 4 (four) times daily. 360 tablet 3  ? cholecalciferol (VITAMIN D3) 25 MCG (1000 UNIT) tablet Take 1 tablet by mouth daily.    ? levothyroxine (SYNTHROID) 50 MCG tablet Take 50 mcg by mouth daily.    ? losartan-hydrochlorothiazide (HYZAAR) 50-12.5 MG tablet TAKE ONE-HALF TABLET BY  MOUTH DAILY 45 tablet 3  ? Multiple Vitamin (MULTIVITAMIN WITH MINERALS) TABS tablet Take 1 tablet by mouth daily.    ? nitroGLYCERIN (NITROSTAT) 0.4 MG SL tablet Place 1 tablet (0.4 mg total) under the tongue every 5 (five) minutes as needed. 25 tablet 6  ? pantoprazole (PROTONIX) 20 MG tablet Take 20 mg by mouth daily.    ? Secukinumab (COSENTYX [place]) Inject into the skin.    ? sulindac (CLINORIL) 150 MG tablet Take 150 mg by mouth daily as needed.    ? ?No current facility-administered medications for this visit.  ? ? ?Allergies: ?No Known Allergies ? ?Social History: ?The patient  reports that he quit smoking about 43 years ago. His smoking use included cigars and pipe. He has never used smokeless tobacco. He reports  current alcohol use. He reports that he does not use drugs. ?  ?Family History: ?The patient's family history includes Appendicitis in his mother; Colon cancer in his father and another family member; Diabetes type II in his father; Lupus in his sister.  ? ?Review of Systems: ?Please see the history of present illness.   All other systems are reviewed and negative.  ? ?Physical Exam: ?VS:  BP (!) 142/56 (BP Location: Right Arm, Patient Position: Sitting, Cuff Size: Normal)   Pulse 83   Ht '5\' 7"'$  (1.702 m)   Wt 153 lb 3.2 oz (69.5 kg)   BMI 23.99 kg/m?  Marland Kitchen  BMI Body mass index is 23.99 kg/m?. ? ?Wt Readings from Last 3 Encounters:  ?12/20/21 153 lb 3.2 oz (69.5 kg)  ?09/26/21 158 lb 3.2 oz (71.8 kg)  ?05/03/21 153 lb  12.8 oz (69.8 kg)  ? ?GENERAL:  Well appearing WM in NAD ?HEENT:  PERRL, EOMI, sclera are clear. Oropharynx is clear. ?NECK:  No jugular venous distention, carotid upstroke brisk and symmetric, no bruits, no thyromegaly or adenopathy ?LUNGS:  Clear to auscultation bilaterally ?CHEST:  Kyphotic.  ?HEART:  RRR,  PMI not displaced or sustained,S1 and S2 within normal limits, no S3, no S4: no clicks, no rubs, no murmurs ?ABD:  Soft, nontender. BS +, no masses or bruits. No hepatomegaly, no splenomegaly ?EXT:  2 + pulses throughout, no edema, no cyanosis no clubbing ?SKIN:  Warm and dry.  No rashes ?NEURO:  Alert and oriented x 3. Cranial nerves II through XII intact. ?PSYCH:  Cognitively intact ? ? ?LABORATORY DATA: ? ?EKG:  EKG is  ordered today. NSR rate 83. Normal. I have personally reviewed and interpreted this study. ? ? ? ?Lab Results  ?Component Value Date  ? WBC 5.6 11/04/2017  ? HGB 12.4 (L) 11/04/2017  ? HCT 36.0 (L) 11/04/2017  ? PLT 189.0 11/04/2017  ? GLUCOSE 98 09/16/2018  ? CHOL 118 11/04/2017  ? TRIG 35.0 11/04/2017  ? HDL 68.70 11/04/2017  ? [place] 42 11/04/2017  ? ALT 20 09/16/2018  ? AST 22 09/16/2018  ? NA 141 09/16/2018  ? K 4.9 09/16/2018  ? CL 102 09/16/2018  ? CREATININE 1.16 09/16/2018  ? BUN 25 09/16/2018  ? CO2 22 09/16/2018  ? TSH 3.29 12/25/2017  ? PSA 0.38 01/15/2010  ? INR 0.9 11/03/2007  ? HGBA1C 5.9 (H) 08/11/2015  ? ?Dated 05/29/17: creatinine 1.17. Hgb 12.4.  ?Dated 10/13/19: cholesterol 141, triglycerides 76, HDL 59, LDL 67. Creatinine 1.2. otherwise CMET, CBC, TFTs normal. ?Dated 10/19/20: cholesterol 133, triglycerides 71, HDL 60, LDL 59. Chemistries and TSH normal ?Dated 11/13/21: cholesterol 130, triglycerides 55, HDL 63, LDL 56. LFTs and TSH normal.  ? ?BNP (last 3 results) ?No results for input(s): BNP in the last 8760 hours. ? ?ProBNP (last 3 results) ?No results for input(s): PROBNP in the last 8760 hours. ? ? ?Other Studies Reviewed Today: ?Carotid dopplers 12/03/16: 1-39%  bilateral carotid disease. ? ?Assessment/Plan: ?1. Carotid stenosis: mild nonobstructive by doppler in 2018.  ? ?2. Hyperlipidemia: on statin therapy. Excellent control   ? ?3. HTN: well controlled on current medication. Continue. ? ?4.Marland Kitchen Ankylosing spondylitis ? ?5. Parkinson's disease. Followed by Neuro. ? ?Current medicines are reviewed with the patient today.  The patient does not have concerns regarding medicines other than what has been noted above. ? ?The following changes have been made:  See above. ? ?Labs/ tests ordered today include:  ? ? ?No orders of  the defined types were placed in this encounter. ? ?Follow up in one year ? ?Signed: ?Shiane Wenberg Martin[place] MD, [facility] ? ?12/20/2021 1:51 PM ? ?[place] ? ?    ?

## 2021-12-20 ENCOUNTER — Encounter: Payer: Self-pay | Admitting: Cardiology

## 2021-12-20 ENCOUNTER — Ambulatory Visit (INDEPENDENT_AMBULATORY_CARE_PROVIDER_SITE_OTHER): Payer: Medicare Other | Admitting: Cardiology

## 2021-12-20 VITALS — BP 142/56 | HR 83 | Ht 67.0 in | Wt 153.2 lb

## 2021-12-20 DIAGNOSIS — I10 Essential (primary) hypertension: Secondary | ICD-10-CM

## 2021-12-20 DIAGNOSIS — E7800 Pure hypercholesterolemia, unspecified: Secondary | ICD-10-CM | POA: Diagnosis not present

## 2022-01-18 DIAGNOSIS — H5203 Hypermetropia, bilateral: Secondary | ICD-10-CM | POA: Diagnosis not present

## 2022-01-18 DIAGNOSIS — H35371 Puckering of macula, right eye: Secondary | ICD-10-CM | POA: Diagnosis not present

## 2022-01-18 DIAGNOSIS — H26491 Other secondary cataract, right eye: Secondary | ICD-10-CM | POA: Diagnosis not present

## 2022-01-18 DIAGNOSIS — Z961 Presence of intraocular lens: Secondary | ICD-10-CM | POA: Diagnosis not present

## 2022-01-24 ENCOUNTER — Ambulatory Visit (INDEPENDENT_AMBULATORY_CARE_PROVIDER_SITE_OTHER): Payer: Medicare Other | Admitting: Neurology

## 2022-01-24 ENCOUNTER — Encounter: Payer: Self-pay | Admitting: Neurology

## 2022-01-24 VITALS — BP 138/72 | HR 77 | Ht 67.0 in | Wt 153.8 lb

## 2022-01-24 DIAGNOSIS — G20 Parkinson's disease: Secondary | ICD-10-CM | POA: Diagnosis not present

## 2022-01-24 DIAGNOSIS — Z9989 Dependence on other enabling machines and devices: Secondary | ICD-10-CM | POA: Diagnosis not present

## 2022-01-24 DIAGNOSIS — G4733 Obstructive sleep apnea (adult) (pediatric): Secondary | ICD-10-CM | POA: Diagnosis not present

## 2022-01-24 NOTE — Progress Notes (Signed)
Subjective:    Casey [name] ID: Casey [name] is a 86 y.o. male.  HPI    Interim history:   Casey [name] is an 85 year old right-handed Casey [name] with an underlying complex medical history of peptic ulcer disease with history of upper GI bleed, TIA, depression, degenerative disc disease in Casey neck, osteoarthritis, kidney stones, hyperlipidemia, hypertension, history of hiatal hernia, BPH, arthritis, and ankylosing spondylitis, and parkinsonism, who presents for follow-up of Casey [name] OSA and parkinsonism. Casey [name] is accompanied by Casey [name] wife again [name].  I last saw him on 09/26/2021, at which time Casey [name] reported that Casey [name] tremor was worse.  Casey [name] reported having fallen.  Casey [name] was compliant with Casey [name] AutoPap but Casey [name] leak was high.  I requested a mask fit appointment through Casey [name] DME company.  We added Sinemet CR at bedtime and kept him on Sinemet IR 1 pill 4 times a day during Casey day.   Casey [name] had side effects from Casey CR and we discontinued Casey C/L ER in March 2023.  [name], 01/24/2022: I reviewed Casey [name] AutoPap compliance data from 12/24/2021 through 01/22/2022, which is a total of 30 days, during which time Casey [name] used Casey [name] machine every night with percent use days greater than 4 hours at 83%, indicating very good compliance with an average usage of 5 hours and 10 minutes, residual AHI slightly better than last time at 15.8/h but still elevated in Casey moderate range, average pressure for Casey 95th percentile at 8.9 cm with a range of 6 to 11 cm with EPR.  Leak still high consistently, perhaps slightly better compared to last time at 93.7 L/min for Casey 95th percentile.  Casey [name] reports doing okay with Casey [name] AutoPap.  Casey [name] still struggling with mask seal.  Casey [name] is taking Sinemet 4 times a day, generally first dose around 6 AM.  It is difficult for him to keep it on a strict schedule.  Casey [name] had trouble tolerating Casey C/L ER, including nervousness, dizziness, nausea.  Previously:  I saw him on 05/03/21, at which time Casey [name] was slightly suboptimal with Casey [name]  AutoPap.  Casey [name] AHI was also elevated around 10/h.  Casey [name] was advised to make an appointment with Casey [name] DME provider for mask refit as Casey [name] leak was also high.  We talked about Casey importance of staying active and about fall prevention.  Casey [name] was advised to continue with Sinemet 1 pill 4 times daily.    I reviewed Casey [name] AutoPap compliance data from 08/26/2021 through 09/24/2021, total of 30 days, during which time Casey [name] used Casey [name] machine every night with percent use days greater than 4 hours at 73%, indicating adequate compliance, average usage of 4 hours and 43 minutes, residual AHI elevated at 18.5 with no obvious significant central component.  Average pressure for Casey 95th percentile at 7.7 cm with a very high leak consistently at 108 L/min, pressure range of 6 to 11 cm with EPR.     I saw him on 10/18/2020, at which time Casey [name] reported feeling stable with regards to Casey [name] parkinsonian symptoms and Casey [name] was tolerating Sinemet 1 pill 4 times a day, felt that Casey [name] benefited from it.  Casey [name] was willing to start AutoPap therapy.   Casey [name] had an interim appointment with Debbora Presto, NP on 01/25/2021, at which time Casey [name] was compliant with AutoPap therapy and felt that it was beneficial.   I reviewed Casey [name] AutoPap compliance data from 04/02/2021 through 05/01/2021, which is a total of 30 days, during which time Casey [name] used Casey [name] machine every night with percent  use days greater than 4 hours at 67%, indicating slightly suboptimal compliance with an average usage of 4 hours and 39 minutes, residual AHI elevated at 10.8/h, 95th percentile of pressure at 9.6 cm with a range of 6 to 11 cm, leak rather high consistently with Casey 95th percentile at 60.1 L/min.   I saw him on 05/10/2020, at which time we went over Casey [name] sleep study results.  Casey [name] was agreeable to starting AutoPap therapy.       I saw him on 04/17/2020, at which time Casey [name] reported some decline in Casey [name] mobility.  Casey [name] was advised to increase C/L to 4 times a day.  Casey [name] wife reported that Casey [name] had some  irregularities in Casey [name] breathing while asleep, also some snoring, Casey [name] reported congestion and feeling of excess mucus.  Casey [name] was advised to proceed with a sleep study.  Casey [name] had a baseline sleep study on 04/26/2020 and we reviewed test results [name].  Sleep efficiency was reduced at 64%, sleep latency 43.5 minutes, REM latency 209.5 minutes.  Casey [name] had a significantly reduced percentage of REM sleep at 6.2, Casey [name] had moderate to severe sleep fragmentation, wake after sleep onset was 140 minutes.  Total AHI was 18.3/h.  Supine AHI was 19.8/h.  Average oxygen saturation was 98%, nadir was 87%.  Casey [name] had mild PLM's no significant arousals.  Casey [name] was advised to proceed with treatment at home in Casey form of AutoPap.  Casey [name] requested a follow-up appointment to discuss.      I saw him on 03/24/2019, at which time Casey [name] reported doing okay with Sinemet, Casey [name] was taking it 3 times a day.  Casey [name] had noticed some benefit including improvement in Casey [name] mobility and balance.  Casey [name] felt that perhaps Casey [name] left hand tremor was a little better.     I spoke to him on 12/23/2018 via phone visit, at which time we mutually agreed to start Sinemet with gradual titration.  Casey [name] had to reschedule Casey [name] DaTscan secondary to Casey virus pandemic.  Casey [name] had Casey [name] DaTscan in Casey interim on 01/07/2019 and I reviewed Casey results: IMPRESSION: Decreased activity within Casey LEFT and RIGHT posterior striata is a pattern typical of Parkinson's syndrome pathology.   We called him with Casey [name] test results.       I first met him on 09/16/2018 at Casey request of Casey [name] rheumatology PA, at which time Casey [name] reported a bilateral hand tremor for Casey past at least one year, left side more noticeable than right. Casey [name] had also increased difficulty with mobility. Casey [name] had some findings concerning for parkinsonism. I suggested we proceed with a nuclear medicine DaT scan but this was delayed secondary to Casey coronavirus pandemic.    09/16/2018: (Casey [name]) reports bilateral hand tremor, left more than right for Casey  past at least one year with some progression noted. Casey [name] wife has noted significant impairment in Casey [name] mobility this time. Casey [name] has a left more than right tremor, Casey left hand is Casey most significant. Casey [name] has not noticed any lower extremity tremor. Casey [name] does have restriction in Casey [name] shoulder mobility, left more than right. Casey [name] has no family history of Parkinson's disease, does not know much about Casey [name] mother's history her family history, she died at 33, when Casey [name] was 86 years old. Casey [name] paternal grandmother lived to be above 87 years old. Casey [name] had a sister who died young at 33 from lupus, Casey [name] brother has significant arthritis issues at age 71. Casey [name] injured Casey [name] right ankle and foot when Casey [name] was a  child and continues to have restricted movements in Casey [name] right ankle. Casey [name] has fallen a few times. Casey [name] fell one or 2 times in Casey last 6 months, one time Casey [name] fell in Casey bathroom and one time Casey [name] fell in Casey closet. Casey [name] has had intermittent numbness in both hands, particularly Casey right hand, particularly with certain movements or posture, such as while holding Casey steering wheel.    I reviewed your office note from 07/10/2018, which you kindly included. Casey [name] has also had some balance issues. For Casey [name] arthritis Casey [name] takes Cosentyx injections monthly. Of note, Casey [name] had a brain MRI without contrast as well as MRA head without contrast on 08/10/2015 and I reviewed Casey results: IMPRESSION: MRI HEAD IMPRESSION:   Negative brain MRI with no acute intracranial infarct or other process identified.   MRA HEAD IMPRESSION:   Negative intracranial MRA. No large or proximal arterial branch occlusion. No high-grade or correctable stenosis.   Casey [name] is retired, lives with Casey [name] wife, Casey [name] quit smoking and drinks alcohol about once a week, caffeine in Casey form of coffee, 2-3 cups per day on average.     Casey [name] Past Medical History Is Significant For: Past Medical History:  Diagnosis Date   Ankylosing spondylitis ([place]) dx'd ~ 1974   Arthritis    "back"  (08/10/2015)   BENIGN PROSTATIC HYPERTROPHY, WITH OBSTRUCTION 01/15/2010   Bleeding duodenal ulcer    Bleeding esophageal ulcer    Bleeding stomach ulcer    GERD 02/22/2008   History of blood transfusion 1988   "lost ~ 1/2 of my blood volume from multiple bleeding ulcers"   History of hiatal hernia    HYPERGLYCEMIA 11/18/2007   HYPERLIPIDEMIA 02/22/2008   HYPERTENSION, UNSPECIFIED 11/10/2009   Kidney stones    Osteoarthritis    c-spine   PEPTIC ULCER DISEASE 11/17/2007   Situational depression    "son died in [place] 07/10/2015"   TIA (transient ischemic attack)    "not that I know of in Casey past; they are trying to determine if I've had one [name] (08/10/2015)"    Casey [name] Past Surgical History Is Significant For: Past Surgical History:  Procedure Laterality Date   CATARACT EXTRACTION W/ INTRAOCULAR LENS  IMPLANT, BILATERAL Bilateral 2013    Casey [name] Family History Is Significant For: Family History  Problem Relation Age of Onset   Appendicitis Mother        complications   Colon cancer Father    Diabetes type II Father    Lupus Sister    Colon cancer Other        1st degree relative <60   Parkinson's disease Neg Hx     Casey [name] Social History Is Significant For: Social History   Socioeconomic History   Marital status: Married    Spouse name: Not on file   Number of children: Not on file   Years of education: Not on file   Highest education level: Not on file  Occupational History   Not on file  Tobacco Use   Smoking status: Former    Types: Cigars, Pipe    Quit date: 08/26/1978    Years since quitting: 43.4   Smokeless tobacco: Never  Vaping Use   Vaping Use: Never used  Substance and Sexual Activity   Alcohol use: Yes    Comment: 08/10/2015 "glass of wine ~ month or so; occasionally a can of beer"   Drug use: No   Sexual activity: Yes  Other Topics Concern   Not on file  Social History Narrative   Retired and married.    Social Determinants of Health   Financial Resource  Strain: Not on file  Food Insecurity: Not on file  Transportation Needs: Not on file  Physical Activity: Not on file  Stress: Not on file  Social Connections: Not on file    Casey [name] Allergies Are:  No Known Allergies:   Casey [name] Current Medications Are:  Outpatient Encounter Medications as of 01/24/2022  Medication Sig   aspirin 81 MG tablet Take 81 mg by mouth daily.     atorvastatin (LIPITOR) 10 MG tablet TAKE 1 TABLET BY MOUTH  DAILY   carbidopa-levodopa (SINEMET IR) 25-100 MG tablet Take 1 tablet by mouth 4 (four) times daily.   cholecalciferol (VITAMIN D3) 25 MCG (1000 UNIT) tablet Take 1 tablet by mouth daily.   levothyroxine (SYNTHROID) 50 MCG tablet Take 50 mcg by mouth daily.   losartan-hydrochlorothiazide (HYZAAR) 50-12.5 MG tablet TAKE ONE-HALF TABLET BY  MOUTH DAILY   Multiple Vitamin (MULTIVITAMIN WITH MINERALS) TABS tablet Take 1 tablet by mouth daily.   pantoprazole (PROTONIX) 20 MG tablet Take 20 mg by mouth daily.   Secukinumab (COSENTYX [place]) Inject into Casey skin.   sulindac (CLINORIL) 150 MG tablet Take 150 mg by mouth daily as needed.   nitroGLYCERIN (NITROSTAT) 0.4 MG SL tablet Place 1 tablet (0.4 mg total) under Casey tongue every 5 (five) minutes as needed.   No facility-administered encounter medications on file as of 01/24/2022.  :  Review of Systems:  Out of a complete 14 point review of systems, all are reviewed and negative with Casey exception of these symptoms as listed below:    Review of Systems  Neurological:        Pt is here for parkinson's follow up   Pt states Casey [name] has no questions or concerns for this visit .   Objective:  Neurological Exam  Physical Exam Physical Examination:   Vitals:   01/24/22 1258  BP: 138/72  Pulse: 77    General Examination: Casey [name] is a very pleasant 86 y.o. male in no acute distress. Casey [name] appears well-developed and well-nourished and well groomed.   HEENT: Normocephalic, atraumatic, pupils are equal, round and reactive  to light, extraocular tracking is preserved fairly well. Casey [name] has no nystagmus, hearing is mildly impaired. Casey [name] has mild facial masking and mild hypophonia, stable. There are no carotid bruits on auscultation. Oropharynx exam reveals: Mild to moderate mouth dryness, adequate dental hygiene, tongue protrudes centrally and palate elevates symmetrically.  Small airway entry, overall mild airway crowding.   Chest: Clear to auscultation without wheezing, rhonchi or crackles noted.   Heart: S1+S2+0, regular and normal without murmurs, rubs or gallops noted.    Abdomen: Soft, non-tender and non-distended.   Extremities: There is no obvious edema.      Skin: Warm and dry without trophic changes noted.   Musculoskeletal: exam reveals lower back stiffness, reduced ROM in neck, restricted mobility in Casey left shoulder, arthritic changes in both hands. Restricted mobility in Casey right ankle from a childhood injury, all appear to be stable.   Neurologically:  Mental status: Casey [name] is awake, alert and oriented in all 4 spheres. Casey [name] immediate and remote memory, attention, language skills and fund of knowledge are appropriate. There is no evidence of aphasia, agnosia, apraxia or anomia.  Cranial nerves II - XII are as described above under HEENT exam.  Motor exam: no focal atrophy.  Strength is globally well-preserved, mild increased tone in  Casey left upper extremity only.   (On 09/16/2018: on Archimedes spiral drawing Casey [name] has moderate difficulty with Casey left hand, mild difficulty with right hand. Handwriting with Casey [name] dominant, right hand is somewhat difficult to read, on Casey smaller side, not particularly tremulous.)   Casey [name] has a mild but intermittent resting tremor in Casey left hand. Casey [name] has no other resting tremor.  Fine motor skills are mild to moderately impaired, more pronounced on Casey left.  Cerebellar testing: No dysmetria or intention tremor.  Sensory exam: intact to light touch.  Gait, station and  balance: Casey [name] stands with difficulty. Posture is moderately stooped with increase in upper back kyphosis noted. Casey [name] walks without a walking aid, slowly and cautiously. Balance is mildly impaired. Assessment and Plan:    In summary, Casey [name] is an 86 year old male with an underlying complex medical history of peptic ulcer disease with history of upper GI bleed, TIA, depression, degenerative disc disease in Casey neck, osteoarthritis, kidney stones, hyperlipidemia, hypertension, history of hiatal hernia, BPH, arthritis, including ankylosing spondylitis (followed by rheumatology), and parkinsonism,with left-sided predominance noted, who presents for follow-up consultation of Casey [name] parkinsonism as well as sleep apnea.  Casey [name] DaTscan from 01/07/2019 was supportive for an underlying parkinsonian syndrome.  Casey [name] diagnostic sleep study on 04/26/2020 showed moderate obstructive sleep apnea by a number of events with an AHI of 18.3/h, but milder desaturations, O2 nadir of 87%. Casey [name] has been on AutoPap therapy since 12/07/2020 and is generally compliant with treatment, however, Casey [name] AHI continues to be suboptimal and leak from Casey mask has been high. Casey [name] is commended for Casey [name] treatment adherence.  From Casey Parkinson's standpoint Casey [name] is advised to continue with Sinemet 1 pill 4 times daily.  Casey [name] tried long-acting Sinemet at bedtime in February, but could not tolerate it, Casey [name] stopped it in March 2023. Casey [name] is advised to follow-up in this clinic in about 6 months.  I answered all their questions [name] and they were in agreement. I spent 30 minutes in total face-to-face time and in reviewing records during pre-charting, more than 50% of which was spent in counseling and coordination of care, reviewing test results, reviewing medications and treatment regimen and/or in discussing or reviewing Casey diagnosis of PD, OSA, Casey prognosis and treatment options. Pertinent laboratory and imaging test results that were available during this visit with Casey  Casey [name] were reviewed by me and considered in my medical decision making (see chart for details).

## 2022-01-29 DIAGNOSIS — M79676 Pain in unspecified toe(s): Secondary | ICD-10-CM | POA: Diagnosis not present

## 2022-01-29 DIAGNOSIS — B351 Tinea unguium: Secondary | ICD-10-CM | POA: Diagnosis not present

## 2022-02-03 ENCOUNTER — Other Ambulatory Visit: Payer: Self-pay | Admitting: Cardiology

## 2022-02-06 ENCOUNTER — Other Ambulatory Visit: Payer: Self-pay | Admitting: Cardiology

## 2022-04-02 ENCOUNTER — Other Ambulatory Visit: Payer: Self-pay | Admitting: Cardiology

## 2022-04-11 DIAGNOSIS — L570 Actinic keratosis: Secondary | ICD-10-CM | POA: Diagnosis not present

## 2022-04-11 DIAGNOSIS — L814 Other melanin hyperpigmentation: Secondary | ICD-10-CM | POA: Diagnosis not present

## 2022-04-11 DIAGNOSIS — D044 Carcinoma in situ of skin of scalp and neck: Secondary | ICD-10-CM | POA: Diagnosis not present

## 2022-04-11 DIAGNOSIS — D225 Melanocytic nevi of trunk: Secondary | ICD-10-CM | POA: Diagnosis not present

## 2022-04-11 DIAGNOSIS — L821 Other seborrheic keratosis: Secondary | ICD-10-CM | POA: Diagnosis not present

## 2022-04-18 DIAGNOSIS — B351 Tinea unguium: Secondary | ICD-10-CM | POA: Diagnosis not present

## 2022-04-18 DIAGNOSIS — M79676 Pain in unspecified toe(s): Secondary | ICD-10-CM | POA: Diagnosis not present

## 2022-05-16 DIAGNOSIS — Z6824 Body mass index (BMI) 24.0-24.9, adult: Secondary | ICD-10-CM | POA: Diagnosis not present

## 2022-05-16 DIAGNOSIS — M1991 Primary osteoarthritis, unspecified site: Secondary | ICD-10-CM | POA: Diagnosis not present

## 2022-05-16 DIAGNOSIS — M459 Ankylosing spondylitis of unspecified sites in spine: Secondary | ICD-10-CM | POA: Diagnosis not present

## 2022-05-16 DIAGNOSIS — M545 Low back pain, unspecified: Secondary | ICD-10-CM | POA: Diagnosis not present

## 2022-05-16 DIAGNOSIS — M542 Cervicalgia: Secondary | ICD-10-CM | POA: Diagnosis not present

## 2022-05-16 DIAGNOSIS — Z79899 Other long term (current) drug therapy: Secondary | ICD-10-CM | POA: Diagnosis not present

## 2022-05-25 DIAGNOSIS — Z23 Encounter for immunization: Secondary | ICD-10-CM | POA: Diagnosis not present

## 2022-06-06 ENCOUNTER — Emergency Department (HOSPITAL_COMMUNITY): Payer: Medicare Other

## 2022-06-06 ENCOUNTER — Emergency Department (HOSPITAL_COMMUNITY)
Admission: EM | Admit: 2022-06-06 | Discharge: 2022-06-06 | Disposition: A | Discharge status: Home / Self Care | Payer: Medicare Other | Attending: Emergency Medicine | Admitting: Emergency Medicine

## 2022-06-06 ENCOUNTER — Other Ambulatory Visit: Payer: Self-pay

## 2022-06-06 ENCOUNTER — Encounter (HOSPITAL_COMMUNITY): Payer: Self-pay

## 2022-06-06 DIAGNOSIS — Y92481 Parking lot as the place of occurrence of the external cause: Secondary | ICD-10-CM | POA: Insufficient documentation

## 2022-06-06 DIAGNOSIS — Y9301 Activity, walking, marching and hiking: Secondary | ICD-10-CM | POA: Diagnosis not present

## 2022-06-06 DIAGNOSIS — S0101XA Laceration without foreign body of scalp, initial encounter: Secondary | ICD-10-CM | POA: Insufficient documentation

## 2022-06-06 DIAGNOSIS — S0990XA Unspecified injury of head, initial encounter: Secondary | ICD-10-CM | POA: Diagnosis present

## 2022-06-06 DIAGNOSIS — G20A1 Parkinson's disease without dyskinesia, without mention of fluctuations: Secondary | ICD-10-CM | POA: Insufficient documentation

## 2022-06-06 DIAGNOSIS — Z7982 Long term (current) use of aspirin: Secondary | ICD-10-CM | POA: Insufficient documentation

## 2022-06-06 DIAGNOSIS — W01198A Fall on same level from slipping, tripping and stumbling with subsequent striking against other object, initial encounter: Secondary | ICD-10-CM | POA: Insufficient documentation

## 2022-06-06 DIAGNOSIS — I10 Essential (primary) hypertension: Secondary | ICD-10-CM | POA: Diagnosis not present

## 2022-06-06 DIAGNOSIS — W19XXXA Unspecified fall, initial encounter: Secondary | ICD-10-CM

## 2022-06-06 DIAGNOSIS — R52 Pain, unspecified: Secondary | ICD-10-CM | POA: Diagnosis not present

## 2022-06-06 LAB — CBC WITH DIFFERENTIAL/PLATELET
Abs Immature Granulocytes: 0.04 10*3/uL (ref 0.00–0.07)
Basophils Absolute: 0 10*3/uL (ref 0.0–0.1)
Basophils Relative: 0 %
Eosinophils Absolute: 0 10*3/uL (ref 0.0–0.5)
Eosinophils Relative: 0 %
HCT: 35.7 % — ABNORMAL LOW (ref 39.0–52.0)
Hemoglobin: 12.3 g/dL — ABNORMAL LOW (ref 13.0–17.0)
Immature Granulocytes: 0 %
Lymphocytes Relative: 10 %
Lymphs Abs: 1 10*3/uL (ref 0.7–4.0)
MCH: 34.5 pg — ABNORMAL HIGH (ref 26.0–34.0)
MCHC: 34.5 g/dL (ref 30.0–36.0)
MCV: 100 fL (ref 80.0–100.0)
Monocytes Absolute: 0.7 10*3/uL (ref 0.1–1.0)
Monocytes Relative: 7 %
Neutro Abs: 8.6 10*3/uL — ABNORMAL HIGH (ref 1.7–7.7)
Neutrophils Relative %: 83 %
Platelets: 239 10*3/uL (ref 150–400)
RBC: 3.57 MIL/uL — ABNORMAL LOW (ref 4.22–5.81)
RDW: 12.5 % (ref 11.5–15.5)
WBC: 10.3 10*3/uL (ref 4.0–10.5)
nRBC: 0 % (ref 0.0–0.2)

## 2022-06-06 LAB — BASIC METABOLIC PANEL
Anion gap: 8 (ref 5–15)
BUN: 22 mg/dL (ref 8–23)
CO2: 26 mmol/L (ref 22–32)
Calcium: 9.5 mg/dL (ref 8.9–10.3)
Chloride: 101 mmol/L (ref 98–111)
Creatinine, Ser: 1.13 mg/dL (ref 0.61–1.24)
GFR, Estimated: >60 mL/min (ref >60)
Glucose, Bld: 116 mg/dL — ABNORMAL HIGH (ref 70–99)
Potassium: 3.8 mmol/L (ref 3.5–5.1)
Sodium: 135 mmol/L (ref 135–145)

## 2022-06-06 MED ORDER — CEFADROXIL 500 MG PO CAPS: 500.0000 mg | ORAL_CAPSULE | Freq: Two times a day (BID) | ORAL | 0 refills | Status: DC

## 2022-06-06 NOTE — ED Notes (Signed)
Pt was okay with giving his wife his key fob. The cell phone cannot be found. Wife does not have it either. EMS at bedside and reports he never saw it.

## 2022-06-06 NOTE — ED Notes (Signed)
Pt needed assistance using urinal. This nurse tech assisted pt with use of urinal. Pt states "is my pants going with me to CT. If not id like security to lock up my key fob and phone." This nurse tech informed secretary to call security to lock up pts belongings. Nurse informed at this time.

## 2022-06-06 NOTE — ED Triage Notes (Signed)
Patient BIB EMS after falling in parking lot and striking head on pavement. States that he tripped and fell while out for daily walk. EMS reports that patient laid on pavement for 30 minutes or more with moderate blood loss noted. Pressure dressing applied to head with bleeding controlled at this time. C-Collar placed by EMS. Patient complaining of headache. Denies LOC.

## 2022-06-06 NOTE — Discharge Instructions (Signed)
I would like for you to follow-up with your primary care doctor early next week for further evaluation.  I will also write you for a short course of antibiotics as we discussed.  Please keep the wound clean and dry.  You may shower as normal.  Gentle scrubbing over the area.  Tylenol for pain.  I would recommend getting some help for this weekend like we talked about.  Please return to the emergency room for any worsening symptoms or concerns you might have.

## 2022-06-06 NOTE — ED Provider Notes (Signed)
[facility] EMERGENCY DEPARTMENT Provider Note   CSN: [id number] Arrival date & time: 06/06/22  1409     History Chief Complaint  Patient presents with   [name] is a 86 y.o. male with history of Parkinson's and ankylosing spondylitis who presents to the emergency department today for further evaluation of a fall that occurred just prior to arrival.  Patient states [place] walking at the park when he was getting back on the pavement he lost his balance and tripped and fell forward striking his head against the concrete.  He laid there for approximately 30 minutes before EMS arrival.  He does not take any anticoagulation.  Denies any other injury.  Unknown whether or not the patient lost consciousness.   Fall       Home Medications Prior to Admission medications   Medication Sig Start Date End Date Taking? Authorizing Provider  aspirin 81 MG tablet Take 81 mg by mouth daily.     Yes [provider]  atorvastatin (LIPITOR) 10 MG tablet TAKE 1 TABLET BY MOUTH  DAILY 04/03/22  Yes Martinique, Peter M, MD  carbidopa-levodopa (SINEMET CR) 50-200 MG tablet Take 1 tablet by mouth 4 (four) times daily. 06/02/22  Yes [provider]  cefadroxil (DURICEF) 500 MG capsule Take 1 capsule (500 mg total) by mouth 2 (two) times daily. 06/06/22  Yes Raul Del, Jolette Lana M, PA-C  cholecalciferol (VITAMIN D3) 25 MCG (1000 UNIT) tablet Take 1 tablet by mouth daily. 04/13/19  Yes [provider]  levothyroxine (SYNTHROID) 50 MCG tablet Take 50 mcg by mouth daily. 01/23/21  Yes [provider]  losartan-hydrochlorothiazide (HYZAAR) 50-12.5 MG tablet TAKE ONE-HALF TABLET BY  MOUTH DAILY 02/04/22  Yes Martinique, Peter M, MD  Multiple Vitamin (MULTIVITAMIN WITH MINERALS) TABS tablet Take 1 tablet by mouth daily.   Yes [provider]  nitroGLYCERIN (NITROSTAT) 0.4 MG SL tablet Place 1 tablet (0.4 mg total) under the tongue every 5 (five) minutes as needed. 08/02/15   Yes Burtis Junes, NP  pantoprazole (PROTONIX) 20 MG tablet Take 20 mg by mouth daily. 03/09/20  Yes [provider]  Secukinumab (COSENTYX [place]) Inject into the skin every 30 (thirty) days.   Yes [provider]  sulindac (CLINORIL) 150 MG tablet Take 150 mg by mouth daily as needed.   Yes [provider]  famotidine (PEPCID) 40 MG tablet 1 tablet Orally Once a day    [provider]  Secukinumab (COSENTYX) 150 MG/ML SOSY 1 ml Subcutaneous one injection monthly for 90 days    [provider]      Allergies    Patient has no known allergies.    Review of Systems   Review of Systems  All other systems reviewed and are negative.   Physical Exam Updated Vital Signs BP (!) 173/69   Pulse (!) 106   Temp 97.7 F (36.5 C) (Oral)   Resp 18   Ht '5\' 7"'$  (1.702 m)   Wt 69.4 kg   SpO2 100%   BMI 23.96 kg/m  Physical Exam Vitals and nursing note reviewed.  Constitutional:      General: He is not in acute distress.    Appearance: Normal appearance.  HENT:     Head: Normocephalic.   Eyes:     General:        Right eye: No discharge.        Left eye: No discharge.  Cardiovascular:  Comments: Regular rate and rhythm.  S1/S2 are distinct without any evidence of murmur, rubs, or gallops.  Radial pulses are 2+ bilaterally.  Dorsalis pedis pulses are 2+ bilaterally.  No evidence of pedal edema. Pulmonary:     Comments: Clear to auscultation bilaterally.  Normal effort.  No respiratory distress.  No evidence of wheezes, rales, or rhonchi heard throughout. Abdominal:     General: Abdomen is flat. Bowel sounds are normal. There is no distension.     Tenderness: There is no abdominal tenderness. There is no guarding or rebound.  Musculoskeletal:        General: Normal range of motion.     Cervical back: Neck supple.  Skin:    General: Skin is warm and dry.     Findings: No rash.  Neurological:     General: No focal deficit present.      Mental Status: He is alert.  Psychiatric:        Mood and Affect: Mood normal.        Behavior: Behavior normal.     ED Results / Procedures / Treatments   Labs (all labs ordered are listed, but only abnormal results are displayed) Labs Reviewed  CBC WITH DIFFERENTIAL/PLATELET - Abnormal; Notable for the following components:      Result Value   RBC 3.57 (*)    Hemoglobin 12.3 (*)    HCT 35.7 (*)    MCH 34.5 (*)    Neutro Abs 8.6 (*)    All other components within normal limits  BASIC METABOLIC PANEL - Abnormal; Notable for the following components:   Glucose, Bld 116 (*)    All other components within normal limits    EKG None  Radiology CT Head Wo Contrast  Result Date: 06/06/2022 CLINICAL DATA:  Fall from standing with head and neck pain. EXAM: CT HEAD WITHOUT CONTRAST CT CERVICAL SPINE WITHOUT CONTRAST TECHNIQUE: Multidetector CT imaging of the head and cervical spine was performed following the standard protocol without intravenous contrast. Multiplanar CT image reconstructions of the cervical spine were also generated. RADIATION DOSE REDUCTION: This exam was performed according to the departmental dose-optimization program which includes automated exposure control, adjustment of the mA and/or kV according to patient size and/or use of iterative reconstruction technique. COMPARISON:  Cervical spine radiographs dated 01/20/2018 and CT head dated 08/10/2015. FINDINGS: CT HEAD FINDINGS Brain: No evidence of acute infarction, hemorrhage, hydrocephalus, extra-axial collection or mass lesion/mass effect. There is mild cerebral volume loss with associated ex vacuo dilatation. Periventricular white matter hypoattenuation likely represents chronic small vessel ischemic disease. Vascular: There are vascular calcifications in the carotid siphons. Skull: Normal. Negative for fracture or focal lesion. Sinuses/Orbits: No acute finding. Other: There is a scalp soft tissue laceration overlying  the right frontal bone. CT CERVICAL SPINE FINDINGS Alignment: Normal. Skull base and vertebrae: No acute fracture. No primary bone lesion or focal pathologic process. Soft tissues and spinal canal: No prevertebral fluid or swelling. No visible canal hematoma. Disc levels:  Multilevel degenerative disc and joint disease. Upper chest: Negative. Other: None. IMPRESSION: 1. No acute intracranial process. 2. No acute osseous injury in the cervical spine. Electronically Signed   By: Zerita Boers M.D.   On: 06/06/2022 16:40   CT Cervical Spine Wo Contrast  Result Date: 06/06/2022 CLINICAL DATA:  Fall from standing with head and neck pain. EXAM: CT HEAD WITHOUT CONTRAST CT CERVICAL SPINE WITHOUT CONTRAST TECHNIQUE: Multidetector CT imaging of the head and cervical spine was performed following  the standard protocol without intravenous contrast. Multiplanar CT image reconstructions of the cervical spine were also generated. RADIATION DOSE REDUCTION: This exam was performed according to the departmental dose-optimization program which includes automated exposure control, adjustment of the mA and/or kV according to patient size and/or use of iterative reconstruction technique. COMPARISON:  Cervical spine radiographs dated 01/20/2018 and CT head dated 08/10/2015. FINDINGS: CT HEAD FINDINGS Brain: No evidence of acute infarction, hemorrhage, hydrocephalus, extra-axial collection or mass lesion/mass effect. There is mild cerebral volume loss with associated ex vacuo dilatation. Periventricular white matter hypoattenuation likely represents chronic small vessel ischemic disease. Vascular: There are vascular calcifications in the carotid siphons. Skull: Normal. Negative for fracture or focal lesion. Sinuses/Orbits: No acute finding. Other: There is a scalp soft tissue laceration overlying the right frontal bone. CT CERVICAL SPINE FINDINGS Alignment: Normal. Skull base and vertebrae: No acute fracture. No primary bone lesion  or focal pathologic process. Soft tissues and spinal canal: No prevertebral fluid or swelling. No visible canal hematoma. Disc levels:  Multilevel degenerative disc and joint disease. Upper chest: Negative. Other: None. IMPRESSION: 1. No acute intracranial process. 2. No acute osseous injury in the cervical spine. Electronically Signed   By: Zerita Boers M.D.   On: 06/06/2022 16:40    Procedures .Marland KitchenLaceration Repair  Date/Time: 06/06/2022 3:23 PM  Performed by: [name] Limes, PA-C Authorized by: [name] Limes, PA-C   Consent:    Consent obtained:  Emergent situation   Consent given by:  Patient   Risks discussed:  Infection Universal protocol:    Procedure explained and questions answered to patient or proxy's satisfaction: yes     Relevant documents present and verified: yes     Test results available: no     Imaging studies available: no     Required blood products, implants, devices, and special equipment available: yes     Site/side marked: yes     Immediately prior to procedure, a time out was called: no     Patient identity confirmed:  Arm band Anesthesia:    Anesthesia method:  Local infiltration   Local anesthetic:  Lidocaine 1% WITH epi Laceration details:    Location:  Scalp   Scalp location:  R parietal   Length (cm):  18   Depth (mm):  8 Pre-procedure details:    Preparation:  Patient was prepped and draped in usual sterile fashion Exploration:    Limited defect created (wound extended): no     Hemostasis achieved with:  Epinephrine and tied off vessels   Imaging outcome: foreign body not noted     Wound exploration: wound explored through full range of motion and entire depth of wound visualized     Wound extent: areolar tissue violated, fascia violated and vascular damage     Wound extent: no foreign bodies/material noted, no muscle damage noted, no nerve damage noted and no tendon damage noted     Contaminated: yes   Treatment:    Area cleansed with:   Povidone-iodine   Amount of cleaning:  Standard   Debridement:  None   Undermining:  None   Layers/structures repaired:  Deep subcutaneous Deep subcutaneous:    Suture size:  3-0   Suture material:  Vicryl   Suture technique:  Simple interrupted   Number of sutures:  3 Skin repair:    Repair method:  Sutures   Suture size:  5-0   Suture material:  Nylon   Suture technique:  Simple interrupted  Number of sutures:  12 Approximation:    Approximation:  Close Repair type:    Repair type:  Complex Post-procedure details:    Dressing:  Bulky dressing   Procedure completion:  Tolerated well, no immediate complications Comments:     There were 5 small arterial bleeds within the laceration.  2 of which stop bleeding with lidocaine and epinephrine.  3 had to be sutured using a figure 8 stitch.     Medications Ordered in ED Medications - No data to display  ED Course/ Medical Decision Making/ A&P Clinical Course as of 06/06/22 1814  Thu Jun 06, 2022  1807 I had a long conversation with the patient and wife at the bedside.  He is quite concerned after this large fall that he will have more frequent falls.  I reassured him that all of his labs and imaging were normal today.  I also educated the patient extensively on wound care and when he should follow-up with his primary care doctor which will be Monday or Tuesday of next week.  In addition, he did mention that he has a daughter that lives in [place] who could likely come help this weekend.  Patient and wife at the bedside are comfortable with him going home today.  I think this is a reasonable plan.  I educated him on when the sutures need to come out. [CF]  1808 CBC with Differential(!) Mild anemia but seems to be at the patient's baseline.  No evidence of leukocytosis. [CF]  0630 Basic metabolic panel(!) Normal. [CF]  1808 I personally ordered imaging over the head and neck.  I do not see any evidence of intracranial hemorrhage.  I  do not see any evidence of neck fracture.  I do agree with the radiologist interpretation. [CF]    Clinical Course User Index [CF] [name] Limes, PA-C                           Medical Decision Making [name] is a 86 y.o. male patient who presents to the emergency department today for further evaluation of fall and subsequent head injury.  Patient arrived with a head wrap over the right parietal scalp laceration.  Upon removing the headwrap there were several small arterial bleeders.  Myself and Dr. Regenia Skeeter closed the arterial bleeds at the bedside with lidocaine and epinephrine in addition to suture material.  I then personally repaired the laceration at the bedside which is highlighted in the procedure note.  I then ordered CT scans of the head and neck.  Also ordered labs as well.  Overall, patient is well-appearing and in no acute distress.  As highlighted in ED course, I will send patient home with antibiotics and follow-up with his primary care doctor.  Patient and wife are amendable to this plan.  All questions or concerns addressed.  Strict return precautions were discussed.  He is safe for discharge.   Amount and/or Complexity of Data Reviewed Labs: ordered. Decision-making details documented in ED Course. Radiology: ordered.  Risk Prescription drug management.   Final Clinical Impression(s) / ED Diagnoses Final diagnoses:  Fall, initial encounter  Laceration of scalp, initial encounter    Rx / DC Orders ED Discharge Orders          Ordered    cefadroxil (DURICEF) 500 MG capsule  2 times daily        06/06/22 1811  Myna Bright Goodenow, PA-C 06/06/22 1814    Sherwood Gambler, MD 06/07/22 1505

## 2022-06-10 DIAGNOSIS — S0511XA Contusion of eyeball and orbital tissues, right eye, initial encounter: Secondary | ICD-10-CM | POA: Diagnosis not present

## 2022-06-11 DIAGNOSIS — G609 Hereditary and idiopathic neuropathy, unspecified: Secondary | ICD-10-CM | POA: Diagnosis not present

## 2022-06-11 DIAGNOSIS — R269 Unspecified abnormalities of gait and mobility: Secondary | ICD-10-CM | POA: Diagnosis not present

## 2022-06-11 DIAGNOSIS — G20A1 Parkinson's disease without dyskinesia, without mention of fluctuations: Secondary | ICD-10-CM | POA: Diagnosis not present

## 2022-06-11 DIAGNOSIS — W19XXXA Unspecified fall, initial encounter: Secondary | ICD-10-CM | POA: Diagnosis not present

## 2022-06-11 DIAGNOSIS — S0003XA Contusion of scalp, initial encounter: Secondary | ICD-10-CM | POA: Diagnosis not present

## 2022-06-13 DIAGNOSIS — R269 Unspecified abnormalities of gait and mobility: Secondary | ICD-10-CM | POA: Diagnosis not present

## 2022-06-13 DIAGNOSIS — G20A1 Parkinson's disease without dyskinesia, without mention of fluctuations: Secondary | ICD-10-CM | POA: Diagnosis not present

## 2022-06-13 DIAGNOSIS — S0003XA Contusion of scalp, initial encounter: Secondary | ICD-10-CM | POA: Diagnosis not present

## 2022-06-17 ENCOUNTER — Telehealth: Payer: Self-pay | Admitting: Neurology

## 2022-06-17 NOTE — Telephone Encounter (Signed)
I called pt back.  He had fall at the end of mile long walk (using hiking stick). Fell and had scalp laceration requiring stiches, seen in ED at [facility].  R foot caught (feesl realted to PD) mobility issue.  Taking sinemet 25/100 tabs 0600-1000-1500-qhs.  Was not able to tolerate the 50-200 CT tab (questioning taking a 1/2 tab (not sure if scored tab).  Had additional questions about up 4-7 x night urinating, the last 2 months (told to see pcp to make sure not UTI or other issue).  Suggestion for another pcp different from [facility].  I relayed will address the medication, other issues may wait until his appt 07-10-2022.

## 2022-06-17 NOTE — Telephone Encounter (Signed)
I called pt and relayed that per Dr. Rexene Alberts to see him at appt prior to changing medications.  See pcp for urinary frequency. Pt verbalized understanding.  I relayed to use walker, mobility devices to prevent falls.

## 2022-06-17 NOTE — Telephone Encounter (Signed)
Pt is calling. Stated he would like to talk to a nurse about medication carbidopa-levodopa (SINEMET CR) 50-200 MG tablet. Pt said she fell at the park a week ago and feels like he is having issues with his Parkinson. Pt is requesting a call-back from the nurse.

## 2022-06-17 NOTE — Telephone Encounter (Signed)
We will address his PD medications at the next appointment. He will need to see PCP for frequent urination.

## 2022-06-21 DIAGNOSIS — R350 Frequency of micturition: Secondary | ICD-10-CM | POA: Diagnosis not present

## 2022-06-27 DIAGNOSIS — B351 Tinea unguium: Secondary | ICD-10-CM | POA: Diagnosis not present

## 2022-06-27 DIAGNOSIS — M79676 Pain in unspecified toe(s): Secondary | ICD-10-CM | POA: Diagnosis not present

## 2022-06-27 DIAGNOSIS — Z23 Encounter for immunization: Secondary | ICD-10-CM | POA: Diagnosis not present

## 2022-07-01 ENCOUNTER — Encounter: Payer: Self-pay | Admitting: Neurology

## 2022-07-03 ENCOUNTER — Encounter: Payer: Self-pay | Admitting: Neurology

## 2022-07-03 ENCOUNTER — Telehealth: Payer: Self-pay | Admitting: Neurology

## 2022-07-03 ENCOUNTER — Ambulatory Visit (INDEPENDENT_AMBULATORY_CARE_PROVIDER_SITE_OTHER): Payer: Medicare Other | Admitting: Neurology

## 2022-07-03 VITALS — BP 146/75 | HR 89 | Ht 67.0 in | Wt 153.2 lb

## 2022-07-03 DIAGNOSIS — R351 Nocturia: Secondary | ICD-10-CM | POA: Diagnosis not present

## 2022-07-03 DIAGNOSIS — Z9181 History of falling: Secondary | ICD-10-CM | POA: Diagnosis not present

## 2022-07-03 DIAGNOSIS — K5909 Other constipation: Secondary | ICD-10-CM

## 2022-07-03 DIAGNOSIS — G20C Parkinsonism, unspecified: Secondary | ICD-10-CM | POA: Diagnosis not present

## 2022-07-03 DIAGNOSIS — N318 Other neuromuscular dysfunction of bladder: Secondary | ICD-10-CM | POA: Diagnosis not present

## 2022-07-03 DIAGNOSIS — Z789 Other specified health status: Secondary | ICD-10-CM | POA: Diagnosis not present

## 2022-07-03 MED ORDER — CARBIDOPA-LEVODOPA 25-100 MG PO TABS: 1.0000 | ORAL_TABLET | Freq: Every day | ORAL | 3 refills | Status: DC

## 2022-07-03 NOTE — Patient Instructions (Signed)
I am sorry to hear that you have had more trouble with mobility and falls.  As discussed, I will make a referral to neuro rehab at [facility] for outpatient OT and PT.  I will also make a referral to urology.  We will continue with carbidopa-levodopa 1 pill 5 times a day at 6 AM, 10 AM, 2 PM, 6 PM and 9 PM daily.  Please use your walker at all times and try to stay well-hydrated.  MiraLAX every other day or daily if need be for constipation control.  Please follow-up to see Amy Lomax in about 3 months.

## 2022-07-03 NOTE — Telephone Encounter (Signed)
Referral for Urology fax to [facility]. Phone: [phone],  Fax: [phone]

## 2022-07-03 NOTE — Progress Notes (Signed)
Subjective:    Patient ID: [name] is a 86 y.o. male.  HPI    Interim history:   [name] is an 86 year old right-handed gentleman with an underlying complex medical history of peptic ulcer disease with history of upper GI bleed, TIA, depression, degenerative disc disease in the neck, osteoarthritis, kidney stones, hyperlipidemia, hypertension, history of hiatal hernia, BPH, arthritis, and ankylosing spondylitis, and parkinsonism, who presents for follow-up of [name] OSA and parkinsonism. The patient is accompanied by [name] wife again today and presents for a sooner than scheduled appointment after a recent fall with injury.  I last saw [name] on 01/24/2022, at which time he was compliant with [name] AutoPap therapy, residual AHI was still elevated and he was still having trouble with mask seal.  He was using Sinemet 4 times a day.  Today, 07/03/2022: He reports feeling okay, reports feeling weak and having difficulty standing and walking at times, he has had trouble with nighttime urination.  He is currently sleeping in a recliner.  He has not been able to use [name] AutoPap because of the forehead laceration and healing scar which is bothered by the headgear.  [name] wife provides additional details of [name] history and reports that since [name] fall in mid October he has had mobility decline, needing more help with activities of daily living including dressing which was previously independent.  He uses a 2 wheeled walker but sometimes has trouble standing.  He has fallen in the past few weeks a few times, no serious injuries with the exception of the forehead laceration last month.  He had follow-up at the primary care for the stitches, they did not check [name] for [name] frequent urination but he did have an urgent care visit and was checked for urinary tract infection which was negative per wife.  She would like for [name] to see a urologist.  She would like for [name] to have PT and OT.  Of note, he presented to the emergency  room on 06/06/2022 after a fall, he sustained a scalp 3 through the right side, needing stitches.  I reviewed the emergency room records from [facility], ED department from 06/06/2022.  He had a head CT without contrast and cervical spine CT without contrast on 06/06/2022 and I reviewed the results:  IMPRESSION: 1. No acute intracranial process. 2. No acute osseous injury in the cervical spine.  Previously:  I saw [name] on 09/26/2021, at which time he reported that [name] tremor was worse.  He reported having fallen.  He was compliant with [name] AutoPap but [name] leak was high.  I requested a mask fit appointment through [name] D[employer] company.  We added Sinemet CR at bedtime and kept [name] on Sinemet IR 1 pill 4 times a day during the day.    He had side effects from the CR and we discontinued the C/L ER in March 2023.   I reviewed [name] AutoPap compliance data from 12/24/2021 through 01/22/2022, which is a total of 30 days, during which time he used [name] machine every night with percent use days greater than 4 hours at 83%, indicating very good compliance with an average usage of 5 hours and 10 minutes, residual AHI slightly better than last time at 15.8/h but still elevated in the moderate range, average pressure for the 95th percentile at 8.9 cm with a range of 6 to 11 cm with EPR.  Leak still high consistently, perhaps slightly better compared to last time at 93.7 L/min for  the 95th percentile.    I saw [name] on 05/03/21, at which time he was slightly suboptimal with [name] AutoPap.  [name] AHI was also elevated around 10/h.  He was advised to make an appointment with [name] DME provider for mask refit as [name] leak was also high.  We talked about the importance of staying active and about fall prevention.  He was advised to continue with Sinemet 1 pill 4 times daily.     I reviewed [name] AutoPap compliance data from 08/26/2021 through 09/24/2021, total of 30 days, during which time he used [name] machine every night with percent use days  greater than 4 hours at 73%, indicating adequate compliance, average usage of 4 hours and 43 minutes, residual AHI elevated at 18.5 with no obvious significant central component.  Average pressure for the 95th percentile at 7.7 cm with a very high leak consistently at 108 L/min, pressure range of 6 to 11 cm with EPR.     I saw [name] on 10/18/2020, at which time he reported feeling stable with regards to [name] parkinsonian symptoms and he was tolerating Sinemet 1 pill 4 times a day, felt that he benefited from it.  He was willing to start AutoPap therapy.   He had an interim appointment with Debbora Presto, NP on 01/25/2021, at which time he was compliant with AutoPap therapy and felt that it was beneficial.   I reviewed [name] AutoPap compliance data from 04/02/2021 through 05/01/2021, which is a total of 30 days, during which time he used [name] machine every night with percent use days greater than 4 hours at 67%, indicating slightly suboptimal compliance with an average usage of 4 hours and 39 minutes, residual AHI elevated at 10.8/h, 95th percentile of pressure at 9.6 cm with a range of 6 to 11 cm, leak rather high consistently with the 95th percentile at 60.1 L/min.   I saw [name] on 05/10/2020, at which time we went over [name] sleep study results.  He was agreeable to starting AutoPap therapy.       I saw [name] on 04/17/2020, at which time he reported some decline in [name] mobility.  He was advised to increase C/L to 4 times a day.  [name] wife reported that he had some irregularities in [name] breathing while asleep, also some snoring, he reported congestion and feeling of excess mucus.  He was advised to proceed with a sleep study.  He had a baseline sleep study on 04/26/2020 and we reviewed test results today.  Sleep efficiency was reduced at 64%, sleep latency 43.5 minutes, REM latency 209.5 minutes.  He had a significantly reduced percentage of REM sleep at 6.2, he had moderate to severe sleep fragmentation, wake after sleep onset was  140 minutes.  Total AHI was 18.3/h.  Supine AHI was 19.8/h.  Average oxygen saturation was 98%, nadir was 87%.  He had mild PLM's no significant arousals.  He was advised to proceed with treatment at home in the form of AutoPap.  He requested a follow-up appointment to discuss.      I saw [name] on 03/24/2019, at which time he reported doing okay with Sinemet, he was taking it 3 times a day.  He had noticed some benefit including improvement in [name] mobility and balance.  He felt that perhaps [name] left hand tremor was a little better.     I spoke to [name] on 12/23/2018 via phone visit, at which time we mutually agreed to start Sinemet with gradual titration.  He had to reschedule [name] DaTscan secondary to the virus pandemic.  He had [name] DaTscan in the interim on 01/07/2019 and I reviewed the results: IMPRESSION: Decreased activity within the LEFT and RIGHT posterior striata is a pattern typical of Parkinson's syndrome pathology.   We called [name] with [name] test results.       I first met [name] on 09/16/2018 at the request of [name] rheumatology PA, at which time he reported a bilateral hand tremor for the past at least one year, left side more noticeable than right. He had also increased difficulty with mobility. He had some findings concerning for parkinsonism. I suggested we proceed with a nuclear medicine DaT scan but this was delayed secondary to the coronavirus pandemic.    09/16/2018: (He) reports bilateral hand tremor, left more than right for the past at least one year with some progression noted. [name] wife has noted significant impairment in [name] mobility this time. He has a left more than right tremor, the left hand is the most significant. He has not noticed any lower extremity tremor. He does have restriction in [name] shoulder mobility, left more than right. He has no family history of Parkinson's disease, does not know much about [name] mother's history her family history, she died at 54, when he was 86 years  old. [name] paternal grandmother lived to be above 79 years old. He had a sister who died young at 33 from lupus, [name] brother has significant arthritis issues at age 80. He injured [name] right ankle and foot when he was a child and continues to have restricted movements in [name] right ankle. He has fallen a few times. He fell one or 2 times in the last 6 months, one time he fell in the bathroom and one time he fell in the closet. He has had intermittent numbness in both hands, particularly the right hand, particularly with certain movements or posture, such as while holding the steering wheel.    I reviewed your office note from 07/10/2018, which you kindly included. He has also had some balance issues. For [name] arthritis he takes Cosentyx injections monthly. Of note, he had a brain MRI without contrast as well as MRA head without contrast on 08/10/2015 and I reviewed the results: IMPRESSION: MRI HEAD IMPRESSION:   Negative brain MRI with no acute intracranial infarct or other process identified.   MRA HEAD IMPRESSION:   Negative intracranial MRA. No large or proximal arterial branch occlusion. No high-grade or correctable stenosis.   He is retired, lives with [name] wife, he quit smoking and drinks alcohol about once a week, caffeine in the form of coffee, 2-3 cups per day on average.       [name] Past Medical History Is Significant For: Past Medical History:  Diagnosis Date   Ankylosing spondylitis ([place]) dx'd ~ 1974   Arthritis    "back" (08/10/2015)   BENIGN PROSTATIC HYPERTROPHY, WITH OBSTRUCTION 01/15/2010   Bleeding duodenal ulcer    Bleeding esophageal ulcer    Bleeding stomach ulcer    GERD 02/22/2008   History of blood transfusion 1988   "lost ~ 1/2 of my blood volume from multiple bleeding ulcers"   History of hiatal hernia    HYPERGLYCEMIA 11/18/2007   HYPERLIPIDEMIA 02/22/2008   HYPERTENSION, UNSPECIFIED 11/10/2009   Kidney stones    Osteoarthritis    c-spine   PEPTIC ULCER DISEASE  11/17/2007   Situational depression    "son died in [place] 07-07-2015"   TIA (transient ischemic  attack)    "not that I know of in the past; they are trying to determine if I've had one today (08/10/2015)"    [name] Past Surgical History Is Significant For: Past Surgical History:  Procedure Laterality Date   CATARACT EXTRACTION W/ INTRAOCULAR LENS  IMPLANT, BILATERAL Bilateral 2013    [name] Family History Is Significant For: Family History  Problem Relation Age of Onset   Appendicitis Mother        complications   Colon cancer Father    Diabetes type II Father    Lupus Sister    Colon cancer Other        1st degree relative <60   Parkinson's disease Neg Hx     [name] Social History Is Significant For: Social History   Socioeconomic History   Marital status: Married    Spouse name: Not on file   Number of children: Not on file   Years of education: Not on file   Highest education level: Not on file  Occupational History   Not on file  Tobacco Use   Smoking status: Former    Types: Cigars, Pipe    Quit date: 08/26/1978    Years since quitting: 43.8   Smokeless tobacco: Never  Vaping Use   Vaping Use: Never used  Substance and Sexual Activity   Alcohol use: Yes    Comment: 08/10/2015 "glass of wine ~ month or so; occasionally a can of beer"   Drug use: No   Sexual activity: Yes  Other Topics Concern   Not on file  Social History Narrative   Retired and married.    Social Determinants of Health   Financial Resource Strain: Not on file  Food Insecurity: Not on file  Transportation Needs: Not on file  Physical Activity: Not on file  Stress: Not on file  Social Connections: Not on file    [name] Allergies Are:  No Known Allergies:   [name] Current Medications Are:  Outpatient Encounter Medications as of 07/03/2022  Medication Sig   aspirin 81 MG tablet Take 81 mg by mouth daily.     atorvastatin (LIPITOR) 10 MG tablet TAKE 1 TABLET BY MOUTH  DAILY   cefadroxil (DURICEF) 500  MG capsule Take 1 capsule (500 mg total) by mouth 2 (two) times daily.   cholecalciferol (VITAMIN D3) 25 MCG (1000 UNIT) tablet Take 1 tablet by mouth daily.   famotidine (PEPCID) 40 MG tablet 1 tablet Orally Once a day   levothyroxine (SYNTHROID) 50 MCG tablet Take 50 mcg by mouth daily.   losartan-hydrochlorothiazide (HYZAAR) 50-12.5 MG tablet TAKE ONE-HALF TABLET BY  MOUTH DAILY   Multiple Vitamin (MULTIVITAMIN WITH MINERALS) TABS tablet Take 1 tablet by mouth daily.   nitroGLYCERIN (NITROSTAT) 0.4 MG SL tablet Place 1 tablet (0.4 mg total) under the tongue every 5 (five) minutes as needed.   pantoprazole (PROTONIX) 20 MG tablet Take 20 mg by mouth daily.   Secukinumab (COSENTYX [place]) Inject into the skin every 30 (thirty) days.   Secukinumab (COSENTYX) 150 MG/ML SOSY 1 ml Subcutaneous one injection monthly for 90 days   sulindac (CLINORIL) 150 MG tablet Take 150 mg by mouth daily as needed.   [DISCONTINUED] carbidopa-levodopa (SINEMET IR) 25-100 MG tablet 4 (four) times daily. Pt takes 4 tablets daily and takes additional tablet at bedtime   carbidopa-levodopa (SINEMET IR) 25-100 MG tablet Take 1 tablet by mouth 5 (five) times daily. Take at 6, 10, 2 PM, 6 PM, 9 PM daily.   [  DISCONTINUED] carbidopa-levodopa (SINEMET CR) 50-200 MG tablet Take 1 tablet by mouth 4 (four) times daily. (Patient not taking: Reported on 07/03/2022)   No facility-administered encounter medications on file as of 07/03/2022.  :  Review of Systems:  Out of a complete 14 point review of systems, all are reviewed and negative with the exception of these symptoms as listed below:  Review of Systems  Neurological:        Pt here for Parkinson's  f/u   Wife states patient fell last month . Pt hit head . Laceration across forehead  Wife states pt has declined after fall  Pt states is gait is off  Pt states  increased left hand tremors . Wife states patient is very dependent. On her for everything . Pt and wife  states this is  a big change     Objective:  Neurological Exam  Physical Exam Physical Examination:   Vitals:   07/03/22 1319  BP: (!) 146/75  Pulse: 89    General Examination: The patient is a very pleasant 86 y.o. male in no acute distress. He appears frail, well-groomed.   HEENT: Normocephalic, atraumatic, with the exception of a healing large scar right forehead, no sutures any longer.  Pupils are equal, round and reactive to light, extraocular tracking is difficult today.  Hearing is mildly impaired. He has mild to moderate facial masking and mild to moderate hypophonia, very slight dysarthria at times. There are no carotid bruits on auscultation.    Chest: Clear to auscultation without wheezing, rhonchi or crackles noted.   Heart: S1+S2+0, regular and normal without murmurs, rubs or gallops noted.    Abdomen: Soft, non-tender and non-distended.   Extremities: There is trace edema around the ankles.      Skin: Warm and dry without trophic changes noted.   Musculoskeletal: exam reveals lower back stiffness, reduced ROM in neck, restricted mobility in the left shoulder, arthritic changes in both hands. Restricted mobility in the right ankle from a childhood injury, all appear to be stable.   Neurologically:  Mental status: The patient is awake, alert and oriented but history is in large part provided by [name] wife today.  Cranial nerves II - XII are as described above under HEENT exam.  Motor exam: no focal atrophy.  Strength is globally mildly weak, mild increased tone in the left upper extremity only.   (On 09/16/2018: on Archimedes spiral drawing he has moderate difficulty with the left hand, mild difficulty with right hand. Handwriting with [name] dominant, right hand is somewhat difficult to read, on the smaller side, not particularly tremulous.)   He has a mild but intermittent resting tremor in the left hand. He has no other resting tremor.  Fine motor skills are mild to moderately  impaired, more pronounced on the left.  Cerebellar testing: No dysmetria or intention tremor.  Sensory exam: intact to light touch.  Gait, station and balance: He stands with difficulty and needs a little assistance with standing. Posture is moderately stooped with increase in upper back kyphosis noted. He walks with a 2 wheeled walker, slowly and cautiously. Balance is impaired.  Assessment and Plan:    In summary, RASUL DECOLA is an 86 year old male with an underlying complex medical history of peptic ulcer disease with history of upper GI bleed, TIA, depression, degenerative disc disease in the neck, osteoarthritis, kidney stones, hyperlipidemia, hypertension, history of hiatal hernia, BPH, arthritis, including ankylosing spondylitis (followed by rheumatology), and parkinsonism, with left-sided predominance,  as well as sleep apnea on AutoPap therapy, who presents for follow-up consultation of [name] parkinsonism after a recent fall.  He sustained a laceration to the forehead in October 2023, he has fallen a few times in the recent past.  He has had motor decline and difficulty with [name] balance and weakness since the fall.  Head CT was negative for intracranial hemorrhage thankfully.  [name] DaTscan from 01/07/2019 was supportive for an underlying parkinsonian syndrome.  [name] diagnostic sleep study on 04/26/2020 showed moderate obstructive sleep apnea by a number of events with an AHI of 18.3/h, but milder desaturations, O2 nadir of 87%. He has been on AutoPap therapy since 12/07/2020 and has been compliant with treatment generally speaking but has had suboptimal apnea control.  Since he sustained a forehead injury, he has not been able to use [name] AutoPap because the headgear irritates the scar.  He is advised to go back to using AutoPap therapy when he can.   He is advised to continue with a 1 pill 5 times a day at 6 AM, 10 AM, 2 PM, 6 PM and 9 PM daily.  I adjusted [name] prescription in that regard.  He is advised  to use [name] walker at all times.  He is advised to stay well-hydrated and proactive about constipation control, take MiraLAX every other day or daily if needed.  He has not seen a urologist for bladder hyperactivity or frequent nighttime urination.  I suggested a referral to urology.  I also made a referral to neuro rehab for outpatient OT and PT.  He tried long-acting Sinemet in the past but could not tolerate it, he stopped it in March 2023. He is advised to follow-up in this clinic in about 3 months to see Debbora Presto, NP I answered all their questions today and they were in agreement. I spent 40 minutes in total face-to-face time and in reviewing records during pre-charting, more than 50% of which was spent in counseling and coordination of care, reviewing test results, reviewing medications and treatment regimen and/or in discussing or reviewing the diagnosis of PD, the prognosis and treatment options. Pertinent laboratory and imaging test results that were available during this visit with the patient were reviewed by me and considered in my medical decision making (see chart for details).

## 2022-07-08 NOTE — Telephone Encounter (Signed)
Appt. 08/09/22 at 1:30pm

## 2022-07-10 ENCOUNTER — Ambulatory Visit: Payer: Medicare Other | Admitting: Neurology

## 2022-07-16 ENCOUNTER — Ambulatory Visit: Payer: Medicare Other | Attending: Neurology

## 2022-07-16 ENCOUNTER — Ambulatory Visit: Payer: Medicare Other | Admitting: Occupational Therapy

## 2022-07-16 ENCOUNTER — Other Ambulatory Visit: Payer: Self-pay

## 2022-07-16 DIAGNOSIS — M6281 Muscle weakness (generalized): Secondary | ICD-10-CM | POA: Insufficient documentation

## 2022-07-16 DIAGNOSIS — R41840 Attention and concentration deficit: Secondary | ICD-10-CM | POA: Diagnosis not present

## 2022-07-16 DIAGNOSIS — R278 Other lack of coordination: Secondary | ICD-10-CM | POA: Diagnosis not present

## 2022-07-16 DIAGNOSIS — R29818 Other symptoms and signs involving the nervous system: Secondary | ICD-10-CM | POA: Diagnosis not present

## 2022-07-16 DIAGNOSIS — R2681 Unsteadiness on feet: Secondary | ICD-10-CM | POA: Insufficient documentation

## 2022-07-16 DIAGNOSIS — R293 Abnormal posture: Secondary | ICD-10-CM | POA: Insufficient documentation

## 2022-07-16 DIAGNOSIS — R2689 Other abnormalities of gait and mobility: Secondary | ICD-10-CM | POA: Diagnosis not present

## 2022-07-16 DIAGNOSIS — R262 Difficulty in walking, not elsewhere classified: Secondary | ICD-10-CM | POA: Insufficient documentation

## 2022-07-16 NOTE — Therapy (Signed)
OUTPATIENT OCCUPATIONAL THERAPY NEURO EVALUATION  Patient Name: [name] MRN: [id number] DOB:04-21-1936, 86 y.o., male Today's Date: 07/16/2022  PCP: Donnajean Lopes, MD REFERRING PROVIDER: Star Age, MD  END OF SESSION:  OT End of Session - 07/16/22 1400     Visit Number 1    Number of Visits 17    Date for OT Re-Evaluation 09/13/22    Authorization Type Medicare A &B    OT Start Time 1104    OT Stop Time 1146    OT Time Calculation (min) 42 min             Past Medical History:  Diagnosis Date   Ankylosing spondylitis ([place]) dx'd ~ 1974   Arthritis    "back" (08/10/2015)   BENIGN PROSTATIC HYPERTROPHY, WITH OBSTRUCTION 01/15/2010   Bleeding duodenal ulcer    Bleeding esophageal ulcer    Bleeding stomach ulcer    GERD 02/22/2008   History of blood transfusion 1988   "lost ~ 1/2 of my blood volume from multiple bleeding ulcers"   History of hiatal hernia    HYPERGLYCEMIA 11/18/2007   HYPERLIPIDEMIA 02/22/2008   HYPERTENSION, UNSPECIFIED 11/10/2009   Kidney stones    Osteoarthritis    c-spine   PEPTIC ULCER DISEASE 11/17/2007   Situational depression    "son died in [place] 2015/06/22"   TIA (transient ischemic attack)    "not that I know of in the past; they are trying to determine if I've had one today (08/10/2015)"   Past Surgical History:  Procedure Laterality Date   CATARACT EXTRACTION W/ INTRAOCULAR LENS  IMPLANT, BILATERAL Bilateral 2013   Patient Active Problem List   Diagnosis Date Noted   Ankylosing spondylitis of cervicothoracic region ([place]) 10/16/2016   TIA (transient ischemic attack) 08/10/2015   Carotid stenosis 08/04/2014   Hyperlipidemia 02/21/2012   BENIGN PROSTATIC HYPERTROPHY, WITH OBSTRUCTION 01/15/2010   Essential hypertension 11/10/2009   GERD 02/22/2008   NEPHROLITHIASIS, HX OF 11/17/2007    ONSET DATE: referral date 07/04/22   REFERRING DIAG: G20.C (ICD-10-CM) - Parkinsonism, unspecified Z91.81 (ICD-10-CM) - History of  falling  THERAPY DIAG:  Other symptoms and signs involving the nervous system  Muscle weakness (generalized)  Attention and concentration deficit  Other lack of coordination  Unsteadiness on feet  Abnormal posture  Rationale for Evaluation and Treatment: Rehabilitation  SUBJECTIVE:   SUBJECTIVE STATEMENT: Pt states: "If you were to ask me those questions (independence/level of assist needed for ADLs/IADLs) a month ago, the answers would be different.  I had a bad fall in Jun 22, 2023 at [place] after a 1 mile walk." Pt and spouse report noticing general balance issues, possible increase in PD specific issues.  Prior to the fall, he was totally independent except for occasional buttons and getting jacket on/off.  He would use a hiking stick when hiking trails, but otherwise Independent with mobility without AD.  Pt accompanied by: self and significant other  PERTINENT HISTORY: history of Parkinson's and ankylosing spondylitis   PRECAUTIONS: Fall  WEIGHT BEARING RESTRICTIONS: No  PAIN:  Are you having pain? Yes: NPRS scale: 5/10 Pain location: neck and shoulders Pain description: arthritis pain Aggravating factors: varies Relieving factors: "bed buddy" at night time  FALLS: Has patient fallen in last 6 months? Yes. Number of falls 6 (3-4 since "big fall" in Jun 22, 2023)  LIVING ENVIRONMENT: Lives with: lives with their spouse Lives in: House/apartment Stairs: Yes: External: 2-3 steps; has had a ramp built just in last week Internal: steps  to basement and steps to loft - however pt does not need to go up/down those at this time. Has following equipment at home: Gilford Rile - 2 wheeled, bed side commode, Ramped entry, and transport chair  PLOF: Independent  PATIENT GOALS: to be able to feel confident in any sort of movement to resume prior activities  OBJECTIVE:   HAND DOMINANCE: Right  ADLs: Overall ADLs: Prior to fall in October 2023, pt Independent with all ADLs and IADLs  without use of AE/DME, even completing shower in standing in walk-in shower. Transfers/ambulation related to ADLs: fluctuates with mobility; now using RW for all mobility, Supervision with all mobility Eating: Mod I Grooming: wife will assist with opening items; otherwise setup and supervision due to balance UB Dressing: Min-mod assist, buttons/zippers are quite challenging LB Dressing: Mod assist, is able to don socks/shoes with increased time Toileting: Max assist Bathing: sponge baths since fall, max assist from wife at this time Tub Shower transfers: has walk-in shower, but does not feel safe in shower since fall in Oct Equipment: bed side commode  IADLs: Meal Prep: since fall in October 2023, pt has been assisting with 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  Pt currently cutting foods for meal prep in sitting position. Medication management: wife assisting Handwriting:  TBA  MOBILITY STATUS: Needs Assist: CGA with mobility with RW and Hx of falls  POSTURE COMMENTS:  rounded shoulders and forward head  FUNCTIONAL OUTCOME MEASURES: Physical performance test: TBD at next session* Upper Extremity Functional Scale (UEFS): TBD at next session Functional reach: TBD at future session  UPPER EXTREMITY ROM:  all movements are slowed, decreased shoulder flexion d/t ankylosing spondylitis  Active ROM Right eval Left eval  Shoulder flexion 90 90  Shoulder abduction    Shoulder adduction    Shoulder extension    Shoulder internal rotation [facility]  Shoulder external rotation [facility] WFL  Elbow flexion WFL WFL  Elbow extension [facility] WFL  Wrist flexion    Wrist extension    Wrist ulnar deviation    Wrist radial deviation    Wrist pronation    Wrist supination    (Blank rows = not tested)  UPPER EXTREMITY MMT:     MMT Right eval Left eval  Shoulder flexion 4/5 4/5  Shoulder abduction    Shoulder adduction    Shoulder extension    Shoulder internal rotation    Shoulder external  rotation    Middle trapezius    Lower trapezius    Elbow flexion 4/5 4/5  Elbow extension 4/5 4/5  Wrist flexion    Wrist extension    Wrist ulnar deviation    Wrist radial deviation    Wrist pronation    Wrist supination    (Blank rows = not tested)  HAND FUNCTION: TBD  COORDINATION: Slowed movements, TBA at next session  COGNITION: Overall cognitive status:  Pt's spouse reports that his thinking seems slower, but attributes it to stress from all issues presenting s/p fall in October  VISION: Subjective report: glasses broke with recent fall Baseline vision: Wears glasses all the time Visual history: cataracts  VISION ASSESSMENT: Not tested  Patient has difficulty with following activities due to following visual impairments: currently difficult to assess as pt does not have glasses with him, as they broke during fall in October 2023.  OBSERVATIONS: Pt with slowed response time and movements during transfers to/from treatment space and during assessment.   TODAY'S TREATMENT:    Educated on bathroom  DME/AE with plans to further practice and recommend, based on pt needs.                                                                                 PATIENT EDUCATION: Education details: Educated on role and purpose of OT as well as potential interventions and goals for therapy based on initial evaluation findings. Person educated: Patient and Spouse Education method: Explanation Education comprehension: verbalized understanding and needs further education  HOME EXERCISE PROGRAM: TBD   GOALS: Goals reviewed with patient? No  SHORT TERM GOALS: Target date: 08/16/22  Pt and wife will report understanding of adaptive techniques and/or potential DME/AE needs to increase ease, safety, and independence w/ ADLs. Baseline: Goal status: INITIAL  2.  Pt will perform dynamic standing task for 5 mins as needed for simple IADLs w/o LOB using DME and/or countertop support prn   Baseline: decreased standing tolerance Goal status: INITIAL  3.  Pt will demonstrate improved ease with sit > stand to get up from toilet or other seating options in home by improving score on sit > stand to decreased fall risk. Baseline: time TBD Goal status: INITIAL  4.  Pt will complete UB dressing (except clothing fasteners) with supervision. Baseline: wife assists with buttons occasionally prior to fall Goal status: INITIAL   LONG TERM GOALS: Target date: 09/13/22  Pt will demonstrate shower transfers with DME PRN at supervision level. Baseline: currently sponge bathing due to fearfulness of showering, but wants to return to showers.   Goal status: INITIAL  2.  Pt will report increased participation in IADLs (such as cooking) to increase independence with functional IADLs.  Baseline:  Goal status: INITIAL  3.  Pt will complete UB dressing, to include clothing fasteners, at Mod I level with use of AE and/or alternative strategies PRN. Baseline:  Goal status: INITIAL  4.  Pt will complete LB dressing with Supervision/setup with use of AE and/or alternative strategies PRN. Baseline:  Goal status: INITIAL  5.  Pt will demonstrate improved dynamic standing balance for 15 mins as needed to engage in ADLs/IADLs with increased safety/endurance.   Baseline:  Goal status: INITIAL  6. Pt will demonstrate increased ease with dressing as evidenced by decreasing PPT#4(don/ doff jacket) by 10 secs or more.  Baseline: TBD  Goal status: INITIAL  ASSESSMENT:  CLINICAL IMPRESSION: Patient is a 86 y.o. male who was seen today for occupational therapy evaluation for impairments due to PD, recent fall in Oct 2023 exacerbating issues with balance and independence with ADLs/IADLs. Prior to fall in Oct 2023, pt completely independent with ADLs/IADLs and walking without AD.  Pt would occasionally require assistance with buttons and getting jacket on/off.  Pt lives with spouse in home where he  can reside on main floor with 2-3 steps to enter.  Recently had ramp installed and would like to be able to get down to basement for laundry.  Pt and wife report "overwhelmed" with change in pt independence and mobility, lacking information of DME/AE to increase pt independence.  Pt will benefit from skilled occupational therapy services to address strength and coordination, ROM, pain management, balance, GM/FM control, cognition, safety awareness, introduction of  compensatory strategies/AE prn, and implementation of an HEP to improve participation and safety during ADLs and IADLs.   PERFORMANCE DEFICITS: in functional skills including ADLs, IADLs, ROM, strength, pain, flexibility, Fine motor control, Gross motor control, mobility, balance, body mechanics, endurance, decreased knowledge of precautions, decreased knowledge of use of DME, and UE functional use, cognitive skills including attention, memory, safety awareness, sequencing, and thought, and psychosocial skills including environmental adaptation and routines and behaviors.   IMPAIRMENTS: are limiting patient from ADLs and IADLs.   CO-MORBIDITIES: may have co-morbidities  that affects occupational performance. Patient will benefit from skilled OT to address above impairments and improve overall function.  MODIFICATION OR ASSISTANCE TO COMPLETE EVALUATION: Min-Moderate modification of tasks or assist with assess necessary to complete an evaluation.  OT OCCUPATIONAL PROFILE AND HISTORY: Detailed assessment: Review of records and additional review of physical, cognitive, psychosocial history related to current functional performance.  CLINICAL DECISION MAKING: Moderate - several treatment options, min-mod task modification necessary  REHAB POTENTIAL: Good  EVALUATION COMPLEXITY: Moderate    PLAN:  OT FREQUENCY: 2x/week  OT DURATION: 8 weeks  PLANNED INTERVENTIONS: self care/ADL training, therapeutic exercise, therapeutic activity,  neuromuscular re-education, manual therapy, balance training, functional mobility training, ultrasound, biofeedback, compression bandaging, moist heat, cryotherapy, patient/family education, cognitive remediation/compensation, visual/perceptual remediation/compensation, psychosocial skills training, energy conservation, coping strategies training, and DME and/or AE instructions  RECOMMENDED OTHER SERVICES: N/A  CONSULTED AND AGREED WITH PLAN OF CARE: Patient and family member/caregiver  PLAN FOR NEXT SESSION: Complete PPT and functional reach assessments.  Begin educating on DME/AE for bathroom transfers and safety/independence with ADLs/IADLs.   Simonne Come, OTR/L 07/16/2022, 2:02 PM

## 2022-07-16 NOTE — Therapy (Signed)
OUTPATIENT PHYSICAL THERAPY NEURO EVALUATION   Patient Name: [name] MRN: [id number] DOB:[birth year], 86 y.o., male Today's Date: 07/16/2022   PCP: Donnajean Lopes, MD REFERRING PROVIDER: Star Age, MD  END OF SESSION:  PT End of Session - 07/16/22 1153     Visit Number 1    Number of Visits 16    Date for PT Re-Evaluation 09/10/22    Authorization Type Medicare    Progress Note Due on Visit 10    PT Start Time 4081    PT Stop Time 1230    PT Time Calculation (min) 45 min             Past Medical History:  Diagnosis Date   Ankylosing spondylitis ([place]) dx'd ~ 1974   Arthritis    "back" (08/10/2015)   BENIGN PROSTATIC HYPERTROPHY, WITH OBSTRUCTION 01/15/2010   Bleeding duodenal ulcer    Bleeding esophageal ulcer    Bleeding stomach ulcer    GERD 02/22/2008   History of blood transfusion 1988   "lost ~ 1/2 of my blood volume from multiple bleeding ulcers"   History of hiatal hernia    HYPERGLYCEMIA 11/18/2007   HYPERLIPIDEMIA 02/22/2008   HYPERTENSION, UNSPECIFIED 11/10/2009   Kidney stones    Osteoarthritis    c-spine   PEPTIC ULCER DISEASE 11/17/2007   Situational depression    "son died in [place] 06/24/15"   TIA (transient ischemic attack)    "not that I know of in the past; they are trying to determine if I've had one today (08/10/2015)"   Past Surgical History:  Procedure Laterality Date   CATARACT EXTRACTION W/ INTRAOCULAR LENS  IMPLANT, BILATERAL Bilateral 2013   Patient Active Problem List   Diagnosis Date Noted   Ankylosing spondylitis of cervicothoracic region ([place]) 10/16/2016   TIA (transient ischemic attack) 08/10/2015   Carotid stenosis 08/04/2014   Hyperlipidemia 02/21/2012   BENIGN PROSTATIC HYPERTROPHY, WITH OBSTRUCTION 01/15/2010   Essential hypertension 11/10/2009   GERD 02/22/2008   NEPHROLITHIASIS, HX OF 11/17/2007    ONSET DATE: Jun 23, 2022, PD for 4 years  REFERRING DIAG: Z91.81 (ICD-10-CM) - History of recent fall G20.C  (ICD-10-CM) - Primary parkinsonism  THERAPY DIAG:  Unsteadiness on feet  Other abnormalities of gait and mobility  Muscle weakness (generalized)  Difficulty in walking, not elsewhere classified  Abnormal posture  Rationale for Evaluation and Treatment: Rehabilitation  SUBJECTIVE:                                                                                                                                                                                             SUBJECTIVE  STATEMENT: Walking at [facility] in October and suffered a fall and striking right forehead resulting in laceration, imaging does not reveal any abnormalities to brain/c-spine.  Has had a significant decline in function and mobility since this hx of falling. since his fall in mid October he has had mobility decline, needing more help with activities of daily living including dressing which was previously independent. He uses a 2 wheeled walker since this incident Pt accompanied by: significant other  PERTINENT HISTORY: PD, complex medical history of peptic ulcer disease with history of upper GI bleed, TIA, depression, degenerative disc disease in the neck, osteoarthritis, kidney stones, hyperlipidemia, hypertension, history of hiatal hernia, BPH, arthritis, and ankylosing spondylitis, and parkinsonism  PAIN:  Are you having pain? No, none more than usual  PRECAUTIONS: None  WEIGHT BEARING RESTRICTIONS: No  FALLS: Has patient fallen in last 6 months? Yes. Number of falls "several"  LIVING ENVIRONMENT: Lives with: lives with their spouse Lives in: House/apartment Stairs:  stairs inside home to loft, ramp to enter/exit home Has following equipment at home: Gilford Rile - 2 wheeled, since the fall in October  PLOF: [place]: return to PLOF  OBJECTIVE:   DIAGNOSTIC FINDINGS: no intracranial or osseous injuries  COGNITION: Overall cognitive status: Within functional limits for tasks  assessed   SENSATION: WFL  COORDINATION: Alternating movements impaired, right ankle limitations pre-morbid from hx of crush injury in childhood Heel to shin mildly impaired right > left Finger to nose WNL Finger opposition: unable to perform bilaterally  EDEMA:  none  MUSCLE TONE: NT  MUSCLE LENGTH: Presents with bilateral knee flexion contractures, approx 5-10 degrees  DTRs:    POSTURE: rounded shoulders, forward head, and increased thoracic kyphosis  LOWER EXTREMITY ROM:     Active  Right Eval Left Eval  Hip flexion    Hip extension    Hip abduction    Hip adduction    Hip internal rotation    Hip external rotation    Knee flexion    Knee extension -10 -10  Ankle dorsiflexion 5 10  Ankle plantarflexion    Ankle inversion    Ankle eversion     (Blank rows = not tested)  LOWER EXTREMITY MMT:  assessed in sitting  MMT Right Eval Left Eval  Hip flexion 4 4  Hip extension    Hip abduction 3+ 3+  Hip adduction 4- 4-  Hip internal rotation    Hip external rotation    Knee flexion 4 4  Knee extension 4- 4-  Ankle dorsiflexion 2+ 3+  Ankle plantarflexion    Ankle inversion    Ankle eversion    (Blank rows = not tested)  BED MOBILITY:  Mod A  TRANSFERS: Assistive device utilized: Environmental consultant - 2 wheeled  Sit to stand: CGA and Min A Stand to sit: SBA Chair to chair: CGA and Min A Floor:  DNT  RAMP:  NT  CURB:  Level of Assistance: CGA Assistive device utilized: Environmental consultant - 2 wheeled Curb Comments:   STAIRS: NT  GAIT: Gait pattern:  right knee extensor thrust in stance, shuffling, and trunk flexed, deficits during turning Distance walked:  Assistive device utilized: Environmental consultant - 2 wheeled Level of assistance: CGA and Min A Comments: unsteady, deficits during turns  FUNCTIONAL TESTS:  5 times sit to stand: unable to complete, retro-pulsion Timed up and go (TUG): 29 sec Push and release test (# of steps): Anterior: absent  Posterior:  absent  M-CTSIB  Condition 1:  Firm Surface, EO 30 Sec, Mild Sway  Condition 2: Firm Surface, EC 30 Sec, Mild and Moderate Sway  Condition 3: Foam Surface, EO 0 Sec,  unable  Sway  Condition 4: Foam Surface, EC - Sec,  -  Sway     VESTIBULAR ASSESSMENT   GENERAL OBSERVATION: right eyebrow ptosis?    SYMPTOM BEHAVIOR:   Subjective history: fall and strike to anterior right forehead   Non-Vestibular symptoms: denies   Type of dizziness: Imbalance (Disequilibrium) and Lightheadedness/Faint   Frequency: "depends"   Duration: "whenever"   Aggravating factors: No known aggravating factors   Relieving factors: no known relieving factors   Progression of symptoms: unchanged   OCULOMOTOR EXAM:   Ocular Alignment: right eye ptosis   Ocular ROM: No Limitations   Spontaneous Nystagmus: absent   Gaze-Induced Nystagmus: age appropriate nystagmus at end range   Smooth Pursuits: saccades   Saccades: hypometric/undershoots, extra eye movements, and slow   Convergence/Divergence: 10 inches      VESTIBULAR - OCULAR REFLEX:    Slow VOR: Comment: difficulty in tracking   VOR Cancellation: Comment: NT   Head-Impulse Test: NT   Dynamic Visual Acuity: Not able to be assessed    POSITIONAL TESTING: unable to test due to time limitation    MOTION SENSITIVITY:    Motion Sensitivity Quotient  Intensity: 0 = none, 1 = Lightheaded, 2 = Mild, 3 = Moderate, 4 = Severe, 5 = Vomiting  Intensity  1. Sitting to supine   2. Supine to L side   3. Supine to R side   4. Supine to sitting   5. L Hallpike-Dix   6. Up from L    7. R Hallpike-Dix   8. Up from R    9. Sitting, head  tipped to L knee   10. Head up from L  knee   11. Sitting, head  tipped to R knee   12. Head up from R  knee   13. Sitting head turns x5   14.Sitting head nods x5   15. In stance, 180  turn to L    16. In stance, 180  turn to R     OTHOSTATICS: not done  FUNCTIONAL GAIT:    VESTIBULAR  TREATMENT:  [place]al[place] Repositioning:   TBD Gaze Adaptation:   TBD Habituation:   TBD Other:  PATIENT SURVEYS:    PATIENT EDUCATION: Education details: assessment findings/interpretations Person educated: Patient and Spouse Education method: Explanation Education comprehension: verbalized understanding  HOME EXERCISE PROGRAM: Access Code: [email or web address] URL: [email or web address] Date: 07/16/2022 Prepared by: Sherlyn Lees  Exercises - Standing Balance in [place]  - 1 x daily - 7 x weekly - 3 sets - 10-30 sec hold   GOALS: Goals reviewed with patient? Yes  SHORT TERM GOALS: Target date: 08/13/2022    Patient will perform HEP with family/caregiver supervision for improved strength, balance, transfers, gaze adaptation, habituation, balance, and gait  Baseline: Goal status: INITIAL  2.  Demonstrate functional transfers with supervision level with or without AD to improve safety with functional mobility Baseline: CGA-min A Goal status: INITIAL  3.  Manifest improved BLE strength as evidenced by ability to complete 5xSTS test in 25 sec to improve safety with mobility Baseline: unable Goal status: INITIAL   LONG TERM GOALS: Target date: 09/10/2022    Demonstrate reduce risk for falls per time 15 sec TUG test with least restrictive AD Baseline: 29 sec w/ RW Goal status: INITIAL  2.  Demo ability to  transfer and ambulate over various surfaces x 300 ft with modified independence to reduce level of assistance from caregiver Baseline: CGA-min A w/ RW Goal status: INITIAL  3.  Demonstrate improved reactive balance as evidenced by ability to enact 2-3 steps with push-release test to facilitate righting reactions and reduce risk for falls Baseline: Absent, LOB Goal status: INITIAL   ASSESSMENT:  CLINICAL IMPRESSION: Patient is a 86 y.o. male who was seen today for physical therapy evaluation and treatment for mobility, balance, and gait deficits and impairments.  Patient exhibits involvement of multiple systems with significant risk for falls and need for physical assistance in functional mobility and ADL performance.  Patient has had significant decline in functional mobility and activity tolerance indicating need for PT intervention to provide for corrective, compensatory, and adaptive techniques to enhance self-efficacy and reduce need for caregiver assistance and reduce risk for falls to improve safety.   OBJECTIVE IMPAIRMENTS: Abnormal gait, decreased activity tolerance, decreased balance, decreased coordination, decreased knowledge of use of DME, difficulty walking, decreased ROM, decreased strength, dizziness, impaired flexibility, improper body mechanics, and postural dysfunction.   ACTIVITY LIMITATIONS: carrying, lifting, bending, standing, squatting, stairs, transfers, bed mobility, bathing, dressing, reach over head, and locomotion level  PARTICIPATION LIMITATIONS: meal prep, cleaning, laundry, shopping, community activity, and yard work  PERSONAL FACTORS: Age, Time since onset of injury/illness/exacerbation, and 3+ comorbidities: see medical hx  are also affecting patient's functional outcome.   REHAB POTENTIAL: Good  CLINICAL DECISION MAKING: Evolving/moderate complexity  EVALUATION COMPLEXITY: Moderate  PLAN:  PT FREQUENCY: 2x/week  PT DURATION: 8 weeks  PLANNED INTERVENTIONS: Therapeutic exercises, Therapeutic activity, Neuromuscular re-education, Balance training, Gait training, Patient/Family education, Self Care, Joint mobilization, Stair training, Vestibular training, Canalith repositioning, DME instructions, Aquatic Therapy, Dry Needling, Electrical stimulation, Wheelchair mobility training, Spinal mobilization, Cryotherapy, Moist heat, Taping, and Manual therapy  PLAN FOR NEXT SESSION: re-assess for positional dizziness, re-assess oculomotor movements, corner balance, sit-stand from elevated surface   3:33 PM, 07/16/22 M.  Sherlyn Lees, PT, DPT Physical Therapist- [place] Office Number: [phone]

## 2022-07-23 NOTE — Therapy (Signed)
OUTPATIENT PHYSICAL THERAPY NEURO TREATMENT   Patient Name: [name] MRN: [id number] DOB:[birth year], 86 y.o., male Today's Date: 07/24/2022   PCP: Donnajean Lopes, MD REFERRING PROVIDER: Star Age, MD  END OF SESSION:  PT End of Session - 07/24/22 1605     Visit Number 2    Number of Visits 16    Date for PT Re-Evaluation 09/10/22    Authorization Type Medicare    Progress Note Due on Visit 10    PT Start Time 1449    PT Stop Time 1534    PT Time Calculation (min) 45 min    Equipment Utilized During Treatment Gait belt    Activity Tolerance Patient tolerated treatment well    Behavior During Therapy WFL for tasks assessed/performed              Past Medical History:  Diagnosis Date   Ankylosing spondylitis ([place]) dx'd ~ 1974   Arthritis    "back" (08/10/2015)   BENIGN PROSTATIC HYPERTROPHY, WITH OBSTRUCTION 01/15/2010   Bleeding duodenal ulcer    Bleeding esophageal ulcer    Bleeding stomach ulcer    GERD 02/22/2008   History of blood transfusion 1988   "lost ~ 1/2 of my blood volume from multiple bleeding ulcers"   History of hiatal hernia    HYPERGLYCEMIA 11/18/2007   HYPERLIPIDEMIA 02/22/2008   HYPERTENSION, UNSPECIFIED 11/10/2009   Kidney stones    Osteoarthritis    c-spine   PEPTIC ULCER DISEASE 11/17/2007   Situational depression    "son died in [place] 06/23/2015"   TIA (transient ischemic attack)    "not that I know of in the past; they are trying to determine if I've had one today (08/10/2015)"   Past Surgical History:  Procedure Laterality Date   CATARACT EXTRACTION W/ INTRAOCULAR LENS  IMPLANT, BILATERAL Bilateral 2013   Patient Active Problem List   Diagnosis Date Noted   Ankylosing spondylitis of cervicothoracic region ([place]) 10/16/2016   TIA (transient ischemic attack) 08/10/2015   Carotid stenosis 08/04/2014   Hyperlipidemia 02/21/2012   BENIGN PROSTATIC HYPERTROPHY, WITH OBSTRUCTION 01/15/2010   Essential hypertension 11/10/2009   GERD  02/22/2008   NEPHROLITHIASIS, HX OF 11/17/2007    ONSET DATE: June 22, 2022, PD for 4 years  REFERRING DIAG: Z91.81 (ICD-10-CM) - History of recent fall G20.C (ICD-10-CM) - Primary parkinsonism  THERAPY DIAG:  Other symptoms and signs involving the nervous system  Muscle weakness (generalized)  Unsteadiness on feet  Abnormal posture  Other abnormalities of gait and mobility  Difficulty in walking, not elsewhere classified  Rationale for Evaluation and Treatment: Rehabilitation  SUBJECTIVE:  SUBJECTIVE STATEMENT: Had a fall on Friday night, fell backwards. Bruised his back but did not hit his head. Feels a little dizzy when getting out of bed and standing up.     Pt accompanied by: significant other  PERTINENT HISTORY: PD, complex medical history of peptic ulcer disease with history of upper GI bleed, TIA, depression, degenerative disc disease in the neck, osteoarthritis, kidney stones, hyperlipidemia, hypertension, history of hiatal hernia, BPH, arthritis, and ankylosing spondylitis, and parkinsonism  PAIN:  Are you having pain? No, none more than usual  PRECAUTIONS: None  WEIGHT BEARING RESTRICTIONS: No  FALLS: Has patient fallen in last 6 months? Yes. Number of falls "several"  LIVING ENVIRONMENT: Lives with: lives with their spouse Lives in: House/apartment Stairs:  stairs inside home to loft, ramp to enter/exit home Has following equipment at home: Gilford Rile - 2 wheeled, since the fall in October  PLOF: [place]: return to PLOF  OBJECTIVE:    TODAY'S TREATMENT: 07/24/22  COORDINATION Alternating pronation/supination: B mod-severe bradykinesia and dysmetria Alternating toe tap: R LE dysmetria Finger to nose: intact B   VESTIBULAR - OCULAR REFLEX:     VOR Cancellation: corrective saccades B   Head-Impulse Test: unable to effectively test d/t guarding and trouble following instruction      POSITIONAL TESTING:  Right Sidelying: negative Left Sidelying: negative    Activity Comments  Gait training with RW from waiting room and within treatment room  Cueing to increased L>R step length; min A required to maneuver walker  Standing with walker in front, wall behind: Standing balance 30" Romberg 30" Standing EC 30" Standing wt shifts forward 5x Cueing to shift wt over toes, push belly button forward to correct retropulsion   STS with walker in front 6x Cues to scoot forward, tuck feet behind, hand placement; CGA- min A; tendency for retropulsion    HOME EXERCISE PROGRAM Last updated: 07/24/22 Access Code: [email or web address] URL: [email or web address] Date: 07/24/2022 Prepared by: [facility]  Program Notes perform with gait belt and wife's supervision  Exercises - Standing Balance in Corner  - 1 x daily - 7 x weekly - 3 sets - 30 sec hold - Narrow Stance with Counter Support  - 1 x daily - 5 x weekly - 2 sets - 10 reps - 30 sec hold - Standing Balance in Corner with Eyes Closed  - 1 x daily - 5 x weekly - 2 sets - 10 reps - 30 sec hold - Sit to Stand with Counter Support  - 1 x daily - 5 x weekly - 2 sets - 5-10 reps    PATIENT EDUCATION: Education details: review/clarification and update to HEP- to be performed with gait belt, wife's supervision, walker in front, and wall behind for max safety Person educated: Patient and Spouse Education method: Explanation, Demonstration, Tactile cues, Verbal cues, and Handouts Education comprehension: verbalized understanding and returned demonstration    Below measures were taken at time of initial evaluation unless otherwise specified:   DIAGNOSTIC FINDINGS: no intracranial or osseous injuries  COGNITION: Overall cognitive status:  Within functional limits for tasks assessed   SENSATION: WFL  COORDINATION: Alternating movements impaired, right ankle limitations pre-morbid from hx of crush injury in childhood Heel to shin mildly impaired right > left Finger to nose WNL Finger opposition: unable to perform bilaterally  EDEMA:  none  MUSCLE TONE: NT  MUSCLE LENGTH: Presents with bilateral knee flexion contractures, approx 5-10 degrees  DTRs:  POSTURE: rounded shoulders, forward head, and increased thoracic kyphosis  LOWER EXTREMITY ROM:     Active  Right Eval Left Eval  Hip flexion    Hip extension    Hip abduction    Hip adduction    Hip internal rotation    Hip external rotation    Knee flexion    Knee extension -10 -10  Ankle dorsiflexion 5 10  Ankle plantarflexion    Ankle inversion    Ankle eversion     (Blank rows = not tested)  LOWER EXTREMITY MMT:  assessed in sitting  MMT Right Eval Left Eval  Hip flexion 4 4  Hip extension    Hip abduction 3+ 3+  Hip adduction 4- 4-  Hip internal rotation    Hip external rotation    Knee flexion 4 4  Knee extension 4- 4-  Ankle dorsiflexion 2+ 3+  Ankle plantarflexion    Ankle inversion    Ankle eversion    (Blank rows = not tested)  BED MOBILITY:  Mod A  TRANSFERS: Assistive device utilized: Environmental consultant - 2 wheeled  Sit to stand: CGA and Min A Stand to sit: SBA Chair to chair: CGA and Min A Floor:  DNT  RAMP:  NT  CURB:  Level of Assistance: CGA Assistive device utilized: Environmental consultant - 2 wheeled Curb Comments:   STAIRS: NT  GAIT: Gait pattern:  right knee extensor thrust in stance, shuffling, and trunk flexed, deficits during turning Distance walked:  Assistive device utilized: Environmental consultant - 2 wheeled Level of assistance: CGA and Min A Comments: unsteady, deficits during turns  FUNCTIONAL TESTS:  5 times sit to stand: unable to complete, retro-pulsion Timed up and go (TUG): 29 sec Push and release test (# of steps):  Anterior: absent  Posterior: absent  M-CTSIB  Condition 1: Firm Surface, EO 30 Sec, Mild Sway  Condition 2: Firm Surface, EC 30 Sec, Mild and Moderate Sway  Condition 3: Foam Surface, EO 0 Sec,  unable  Sway  Condition 4: Foam Surface, EC - Sec,  -  Sway     VESTIBULAR ASSESSMENT   GENERAL OBSERVATION: right eyebrow ptosis?    SYMPTOM BEHAVIOR:   Subjective history: fall and strike to anterior right forehead   Non-Vestibular symptoms: denies   Type of dizziness: Imbalance (Disequilibrium) and Lightheadedness/Faint   Frequency: "depends"   Duration: "whenever"   Aggravating factors: No known aggravating factors   Relieving factors: no known relieving factors   Progression of symptoms: unchanged   OCULOMOTOR EXAM:   Ocular Alignment: right eye ptosis   Ocular ROM: No Limitations   Spontaneous Nystagmus: absent   Gaze-Induced Nystagmus: age appropriate nystagmus at end range   Smooth Pursuits: saccades   Saccades: hypometric/undershoots, extra eye movements, and slow   Convergence/Divergence: 10 inches      VESTIBULAR - OCULAR REFLEX:    Slow VOR: Comment: difficulty in tracking   VOR Cancellation: Comment: NT   Head-Impulse Test: NT   Dynamic Visual Acuity: Not able to be assessed    POSITIONAL TESTING: unable to test due to time limitation    MOTION SENSITIVITY:    Motion Sensitivity Quotient  Intensity: 0 = none, 1 = Lightheaded, 2 = Mild, 3 = Moderate, 4 = Severe, 5 = Vomiting  Intensity  1. Sitting to supine   2. Supine to L side   3. Supine to R side   4. Supine to sitting   5. L Hallpike-Dix   6. Up from L  7. R Hallpike-Dix   8. Up from R    9. Sitting, head  tipped to L knee   10. Head up from L  knee   11. Sitting, head  tipped to R knee   12. Head up from R  knee   13. Sitting head turns x5   14.Sitting head nods x5   15. In stance, 180  turn to L    16. In stance, 180  turn to R     OTHOSTATICS: not done  FUNCTIONAL GAIT:     VESTIBULAR TREATMENT:  Canalith Repositioning:   TBD Gaze Adaptation:   TBD Habituation:   TBD Other:  PATIENT SURVEYS:    PATIENT EDUCATION: Education details: assessment findings/interpretations Person educated: Patient and Spouse Education method: Explanation Education comprehension: verbalized understanding  HOME EXERCISE PROGRAM: Access Code: [email or web address] URL: [email or web address] Date: 07/16/2022 Prepared by: Sherlyn Lees  Exercises - Standing Balance in [place]  - 1 x daily - 7 x weekly - 3 sets - 10-30 sec hold   GOALS: Goals reviewed with patient? Yes  SHORT TERM GOALS: Target date: 08/13/2022    Patient will perform HEP with family/caregiver supervision for improved strength, balance, transfers, gaze adaptation, habituation, balance, and gait  Baseline: Goal status: IN PROGRESS  2.  Demonstrate functional transfers with supervision level with or without AD to improve safety with functional mobility Baseline: CGA-min A Goal status: IN PROGRESS  3.  Manifest improved BLE strength as evidenced by ability to complete 5xSTS test in 25 sec to improve safety with mobility Baseline: unable Goal status: IN PROGRESS   LONG TERM GOALS: Target date: 09/10/2022    Demonstrate reduce risk for falls per time 15 sec TUG test with least restrictive AD Baseline: 29 sec w/ RW Goal status: IN PROGRESS  2.  Demo ability to transfer and ambulate over various surfaces x 300 ft with modified independence to reduce level of assistance from caregiver Baseline: CGA-min A w/ RW Goal status: IN PROGRESS  3.  Demonstrate improved reactive balance as evidenced by ability to enact 2-3 steps with push-release test to facilitate righting reactions and reduce risk for falls Baseline: Absent, LOB Goal status: IN PROGRESS   ASSESSMENT:  CLINICAL IMPRESSION: Patient arrived to session with report of falling backwards on Friday, bruising his back but without other  injuries. Completed vestibular assessment with oculomotor exam revealing corrective saccades B with VOR cancellation, and HIT inconclusive d/t guarding and poor patient understanding. Positional testing was negative. Coordination testing revealed R LE and B UE dysmetria. Since patient did have clear head imaging after his fall with head trauma, will keep abnormal testing in mind if symptoms worsen as they could have been present at baseline. Worked on standing static balance activities and STS with cueing required to correct retropulsion. Patient and wife received thorough edu on safety with HEP. Both reported understanding and without complaints at end of session.   OBJECTIVE IMPAIRMENTS: Abnormal gait, decreased activity tolerance, decreased balance, decreased coordination, decreased knowledge of use of DME, difficulty walking, decreased ROM, decreased strength, dizziness, impaired flexibility, improper body mechanics, and postural dysfunction.   ACTIVITY LIMITATIONS: carrying, lifting, bending, standing, squatting, stairs, transfers, bed mobility, bathing, dressing, reach over head, and locomotion level  PARTICIPATION LIMITATIONS: meal prep, cleaning, laundry, shopping, community activity, and yard work  PERSONAL FACTORS: Age, Time since onset of injury/illness/exacerbation, and 3+ comorbidities: see medical hx  are also affecting patient's functional outcome.   REHAB POTENTIAL: Good  CLINICAL DECISION MAKING: Evolving/moderate  complexity  EVALUATION COMPLEXITY: Moderate  PLAN:  PT FREQUENCY: 2x/week  PT DURATION: 8 weeks  PLANNED INTERVENTIONS: Therapeutic exercises, Therapeutic activity, Neuromuscular re-education, Balance training, Gait training, Patient/Family education, Self Care, Joint mobilization, Stair training, Vestibular training, Canalith repositioning, DME instructions, Aquatic Therapy, Dry Needling, Electrical stimulation, Wheelchair mobility training, Spinal mobilization,  Cryotherapy, Moist heat, Taping, and Manual therapy  PLAN FOR NEXT SESSION: corner balance, sit-stand from elevated surface; address retropulsion and balance reactions     Janene Harvey, PT, DPT 07/24/22 4:16 PM  [facility] at [facility] [address], [place], [place] [address] Phone # [phone] Fax # [phone]

## 2022-07-24 ENCOUNTER — Ambulatory Visit: Payer: Medicare Other | Admitting: Physical Therapy

## 2022-07-24 ENCOUNTER — Encounter: Payer: Self-pay | Admitting: Physical Therapy

## 2022-07-24 ENCOUNTER — Ambulatory Visit: Payer: Medicare Other | Admitting: Occupational Therapy

## 2022-07-24 DIAGNOSIS — R2689 Other abnormalities of gait and mobility: Secondary | ICD-10-CM

## 2022-07-24 DIAGNOSIS — M6281 Muscle weakness (generalized): Secondary | ICD-10-CM

## 2022-07-24 DIAGNOSIS — R2681 Unsteadiness on feet: Secondary | ICD-10-CM

## 2022-07-24 DIAGNOSIS — R29818 Other symptoms and signs involving the nervous system: Secondary | ICD-10-CM

## 2022-07-24 DIAGNOSIS — R293 Abnormal posture: Secondary | ICD-10-CM | POA: Diagnosis not present

## 2022-07-24 DIAGNOSIS — R262 Difficulty in walking, not elsewhere classified: Secondary | ICD-10-CM | POA: Diagnosis not present

## 2022-07-24 DIAGNOSIS — R41840 Attention and concentration deficit: Secondary | ICD-10-CM

## 2022-07-24 NOTE — Therapy (Signed)
OUTPATIENT OCCUPATIONAL THERAPY Treatment Session  Patient Name: [name] MRN: [id number] DOB:1936/03/24, 86 y.o., male Today's Date: 07/24/2022  PCP: Donnajean Lopes, MD REFERRING PROVIDER: Star Age, MD  END OF SESSION:  OT End of Session - 07/24/22 1632     Visit Number 2    Number of Visits 17    Date for OT Re-Evaluation 09/13/22    Authorization Type Medicare A &B    OT Start Time 63    OT Stop Time 4967    OT Time Calculation (min) 44 min              Past Medical History:  Diagnosis Date   Ankylosing spondylitis ([place]) dx'd ~ 1974   Arthritis    "back" (08/10/2015)   BENIGN PROSTATIC HYPERTROPHY, WITH OBSTRUCTION 01/15/2010   Bleeding duodenal ulcer    Bleeding esophageal ulcer    Bleeding stomach ulcer    GERD 02/22/2008   History of blood transfusion 1988   "lost ~ 1/2 of my blood volume from multiple bleeding ulcers"   History of hiatal hernia    HYPERGLYCEMIA 11/18/2007   HYPERLIPIDEMIA 02/22/2008   HYPERTENSION, UNSPECIFIED 11/10/2009   Kidney stones    Osteoarthritis    c-spine   PEPTIC ULCER DISEASE 11/17/2007   Situational depression    "son died in [place] June 25, 2015"   TIA (transient ischemic attack)    "not that I know of in the past; they are trying to determine if I've had one today (08/10/2015)"   Past Surgical History:  Procedure Laterality Date   CATARACT EXTRACTION W/ INTRAOCULAR LENS  IMPLANT, BILATERAL Bilateral 2013   Patient Active Problem List   Diagnosis Date Noted   Ankylosing spondylitis of cervicothoracic region ([place]) 10/16/2016   TIA (transient ischemic attack) 08/10/2015   Carotid stenosis 08/04/2014   Hyperlipidemia 02/21/2012   BENIGN PROSTATIC HYPERTROPHY, WITH OBSTRUCTION 01/15/2010   Essential hypertension 11/10/2009   GERD 02/22/2008   NEPHROLITHIASIS, HX OF 11/17/2007    ONSET DATE: referral date 07/04/22   REFERRING DIAG: G20.C (ICD-10-CM) - Parkinsonism, unspecified Z91.81 (ICD-10-CM) - History of  falling  THERAPY DIAG:  Other symptoms and signs involving the nervous system  Muscle weakness (generalized)  Attention and concentration deficit  Unsteadiness on feet  Rationale for Evaluation and Treatment: Rehabilitation  SUBJECTIVE:   SUBJECTIVE STATEMENT: Pt's spouse reports that he did better for a couple days, but seems to be more tired today.  Pt reports that it seems like pattern "comes and goes" and stability fluctuates.  Pt accompanied by: self and significant other  PERTINENT HISTORY: history of Parkinson's and ankylosing spondylitis   PRECAUTIONS: Fall  WEIGHT BEARING RESTRICTIONS: No  PAIN:  Are you having pain? Yes: NPRS scale: 3/10 Pain location: L lower back Pain description: arthritis pain Aggravating factors: varies Relieving factors: "bed buddy" at night time  FALLS: Has patient fallen in last 6 months? Yes. Number of falls 6 (3-4 since "big fall" in Jun 25, 2023)  LIVING ENVIRONMENT: Lives with: lives with their spouse Lives in: House/apartment Stairs: Yes: External: 2-3 steps; has had a ramp built just in last week Internal: steps to basement and steps to loft - however pt does not need to go up/down those at this time. Has following equipment at home: Walker - 2 wheeled, bed side commode, Ramped entry, and transport chair  PLOF: Independent  PATIENT GOALS: to be able to feel confident in any sort of movement to resume prior activities  OBJECTIVE:   HAND DOMINANCE:  Right  ADLs: Overall ADLs: Prior to fall in October 2023, pt Independent with all ADLs and IADLs without use of AE/DME, even completing shower in standing in walk-in shower. Transfers/ambulation related to ADLs: fluctuates with mobility; now using RW for all mobility, Supervision with all mobility Eating: Mod I Grooming: wife will assist with opening items; otherwise setup and supervision due to balance UB Dressing: Min-mod assist, buttons/zippers are quite challenging LB Dressing: Mod  assist, is able to don socks/shoes with increased time Toileting: Max assist Bathing: sponge baths since fall, max assist from wife at this time Tub Shower transfers: has walk-in shower, but does not feel safe in shower since fall in Oct Equipment: bed side commode  IADLs: Meal Prep: since fall in October 2023, pt has been assisting with 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  Pt currently cutting foods for meal prep in sitting position. Medication management: wife assisting Handwriting:  TBA  MOBILITY STATUS: Needs Assist: CGA with mobility with RW and Hx of falls  POSTURE COMMENTS:  rounded shoulders and forward head  FUNCTIONAL OUTCOME MEASURES: Physical performance test: PPT #4 donning/doffing jacket: unable to complete due to LOB* 5 time sit > stand:  1:05.94 with up to mod assist and cues to scoot forward towards edge of chair and needing to push up from RW.  UPPER EXTREMITY ROM:  all movements are slowed, decreased shoulder flexion d/t ankylosing spondylitis  Active ROM Right eval Left eval  Shoulder flexion 90 90  Shoulder abduction    Shoulder adduction    Shoulder extension    Shoulder internal rotation [facility]  Shoulder external rotation [facility]  Elbow flexion [facility] WFL  Elbow extension [facility] WFL  Wrist flexion    Wrist extension    Wrist ulnar deviation    Wrist radial deviation    Wrist pronation    Wrist supination    (Blank rows = not tested)  UPPER EXTREMITY MMT:     MMT Right eval Left eval  Shoulder flexion 4/5 4/5  Shoulder abduction    Shoulder adduction    Shoulder extension    Shoulder internal rotation    Shoulder external rotation    Middle trapezius    Lower trapezius    Elbow flexion 4/5 4/5  Elbow extension 4/5 4/5  Wrist flexion    Wrist extension    Wrist ulnar deviation    Wrist radial deviation    Wrist pronation    Wrist supination    (Blank rows = not tested)  HAND FUNCTION: TBD  COORDINATION: Slowed movements, TBA at future  session  COGNITION: Overall cognitive status:  Pt's spouse reports that his thinking seems slower, but attributes it to stress from all issues presenting s/p fall in October  VISION: Subjective report: glasses broke with recent fall Baseline vision: Wears glasses all the time Visual history: cataracts  VISION ASSESSMENT: Not tested  Patient has difficulty with following activities due to following visual impairments: currently difficult to assess as pt does not have glasses with him, as they broke during fall in October 2023.  OBSERVATIONS: Pt with slowed response time and movements during transfers to/from treatment space and during assessment.   TODAY'S TREATMENT:    07/24/22 5 time sit > stand: 1:05.94 with up to mod assist and cues to scoot forward towards edge of chair and needing to push up from RW.  OT provided cues and instruction for hand placement and anterior weight shift during sit > stand to decrease  tendency to push up on RW.  Pt completed with massed practice and decreasing assist to close supervision without arm rests or pushing up from RW. Jacket: Attempted to complete #4 of PPT however pt requiring > 26 seconds before OT stopping timer.  Pt able to thread single UE before losing balance while attempting to thread LUE.  OT providing mod assist to lower pt to chair. Reviewed AE/DME: discussed with pt and wife recommendations for grab bars in bathroom.  Discussed installed grab bars vs suction cup grab bars for increased safety.  Pt's spouse reports bathroom shower may need to be remodeled to allow for installed grab bars.  Discussed shower chair with/without arm rests vs BSC vs tub bench for seat in walk-in shower.  OT provided pt and spouse with pictures of options and encouraged them to look at options and take measurements of shower to allow for appropriate equipment and practice during future sessions.   Eval Educated on bathroom DME/AE with plans to further practice and  recommend, based on pt needs.                                               PATIENT EDUCATION: Education details: Educated on role and purpose of OT as well as potential interventions and goals for therapy based on initial evaluation findings. Person educated: Patient and Spouse Education method: Explanation Education comprehension: verbalized understanding and needs further education  HOME EXERCISE PROGRAM: TBD   GOALS: Goals reviewed with patient? Yes  SHORT TERM GOALS: Target date: 08/16/22  Pt and wife will report understanding of adaptive techniques and/or potential DME/AE needs to increase ease, safety, and independence w/ ADLs. Baseline: Goal status: IN PROGRESS  2.  Pt will perform dynamic standing task for 5 mins as needed for simple IADLs w/o LOB using DME and/or countertop support prn  Baseline: decreased standing tolerance Goal status: IN PROGRESS  3.  Pt will demonstrate improved ease with sit > stand to get up from toilet or other seating options in home by improving score on sit > stand to decreased fall risk. Baseline: 1:05.94 Goal status: IN PROGRESS  4.  Pt will complete UB dressing (except clothing fasteners) with supervision. Baseline: wife assists with buttons occasionally prior to fall Goal status: IN PROGRESS   LONG TERM GOALS: Target date: 09/13/22  Pt will demonstrate shower transfers with DME PRN at supervision level. Baseline: currently sponge bathing due to fearfulness of showering, but wants to return to showers.   Goal status: IN PROGRESS  2.  Pt will report increased participation in IADLs (such as cooking) to increase independence with functional IADLs.  Baseline:  Goal status: IN PROGRESS  3.  Pt will complete UB dressing, to include clothing fasteners, at Mod I level with use of AE and/or alternative strategies PRN. Baseline:  Goal status: INITIAL  4.  Pt will complete LB dressing with Supervision/setup with use of AE and/or alternative  strategies PRN. Baseline:  Goal status: IN PROGRESS  5.  Pt will demonstrate improved dynamic standing balance for 15 mins as needed to engage in ADLs/IADLs with increased safety/endurance.   Baseline:  Goal status: IN PROGRESS  6. Pt will demonstrate increased ease with dressing as evidenced by decreasing PPT#4(don/ doff jacket) by 10 secs or more.  Baseline: TBD  Goal status: IN PROGRESS  ASSESSMENT:  CLINICAL IMPRESSION: Pt present  for first therapy session s/p initial evaluation.  OT reiterated OT POC and discussed goals with pt and pt's spouse, both in agreement.  Pt demonstrating good carryover of education on hand placement and anterior weight shift during sit > stand progressing from mod to CGA for sit > stand without reliance on pushing up through RW.  Pt and spouse receptive to safety concerns with pushing up on RW.  Pt with LOB when attempting to don simulated jacket, requiring assistance to steady and lower to sitting in chair.  Pt will benefit from continued practice to reinforce carryover of education on hand placement during sit > stand and to progress safety and independence with self-care tasks.    PERFORMANCE DEFICITS: in functional skills including ADLs, IADLs, ROM, strength, pain, flexibility, Fine motor control, Gross motor control, mobility, balance, body mechanics, endurance, decreased knowledge of precautions, decreased knowledge of use of DME, and UE functional use, cognitive skills including attention, memory, safety awareness, sequencing, and thought, and psychosocial skills including environmental adaptation and routines and behaviors.   IMPAIRMENTS: are limiting patient from ADLs and IADLs.   CO-MORBIDITIES: may have co-morbidities  that affects occupational performance. Patient will benefit from skilled OT to address above impairments and improve overall function.  MODIFICATION OR ASSISTANCE TO COMPLETE EVALUATION: Min-Moderate modification of tasks or assist with  assess necessary to complete an evaluation.  OT OCCUPATIONAL PROFILE AND HISTORY: Detailed assessment: Review of records and additional review of physical, cognitive, psychosocial history related to current functional performance.  CLINICAL DECISION MAKING: Moderate - several treatment options, min-mod task modification necessary  REHAB POTENTIAL: Good  EVALUATION COMPLEXITY: Moderate    PLAN:  OT FREQUENCY: 2x/week  OT DURATION: 8 weeks  PLANNED INTERVENTIONS: self care/ADL training, therapeutic exercise, therapeutic activity, neuromuscular re-education, manual therapy, balance training, functional mobility training, ultrasound, biofeedback, compression bandaging, moist heat, cryotherapy, patient/family education, cognitive remediation/compensation, visual/perceptual remediation/compensation, psychosocial skills training, energy conservation, coping strategies training, and DME and/or AE instructions  RECOMMENDED OTHER SERVICES: N/A  CONSULTED AND AGREED WITH PLAN OF CARE: Patient and family member/caregiver  PLAN FOR NEXT SESSION:  Reiterate DME/AE for bathroom transfers and safety/independence with ADLs/IADLs. Practice shower transfers with stepping over shower ledge and continue to discuss DME/AE for bathroom transfers and dressing tasks.   Simonne Come, OTR/L 07/24/2022, 4:32 PM

## 2022-07-27 ENCOUNTER — Other Ambulatory Visit: Payer: Self-pay | Admitting: Neurology

## 2022-07-29 ENCOUNTER — Ambulatory Visit: Payer: Medicare Other

## 2022-07-29 ENCOUNTER — Ambulatory Visit: Payer: Medicare Other | Attending: Neurology | Admitting: Occupational Therapy

## 2022-07-29 DIAGNOSIS — R278 Other lack of coordination: Secondary | ICD-10-CM | POA: Insufficient documentation

## 2022-07-29 DIAGNOSIS — M6281 Muscle weakness (generalized): Secondary | ICD-10-CM | POA: Diagnosis not present

## 2022-07-29 DIAGNOSIS — R2689 Other abnormalities of gait and mobility: Secondary | ICD-10-CM

## 2022-07-29 DIAGNOSIS — R29818 Other symptoms and signs involving the nervous system: Secondary | ICD-10-CM | POA: Diagnosis not present

## 2022-07-29 DIAGNOSIS — R262 Difficulty in walking, not elsewhere classified: Secondary | ICD-10-CM | POA: Insufficient documentation

## 2022-07-29 DIAGNOSIS — R293 Abnormal posture: Secondary | ICD-10-CM | POA: Diagnosis not present

## 2022-07-29 DIAGNOSIS — R41840 Attention and concentration deficit: Secondary | ICD-10-CM | POA: Diagnosis not present

## 2022-07-29 DIAGNOSIS — R2681 Unsteadiness on feet: Secondary | ICD-10-CM | POA: Insufficient documentation

## 2022-07-29 NOTE — Therapy (Signed)
OUTPATIENT OCCUPATIONAL THERAPY Treatment Session  Patient Name: [name] MRN: [id number] DOB:04/20/1936, 86 y.o., male Today's Date: 07/29/2022  PCP: Donnajean Lopes, MD REFERRING PROVIDER: Star Age, MD  END OF SESSION:  OT End of Session - 07/29/22 1641     Visit Number 3    Number of Visits 17    Date for OT Re-Evaluation 09/13/22    Authorization Type Medicare A &B    OT Start Time 7829    OT Stop Time 5621    OT Time Calculation (min) 40 min               Past Medical History:  Diagnosis Date   Ankylosing spondylitis ([place]) dx'd ~ 1974   Arthritis    "back" (08/10/2015)   BENIGN PROSTATIC HYPERTROPHY, WITH OBSTRUCTION 01/15/2010   Bleeding duodenal ulcer    Bleeding esophageal ulcer    Bleeding stomach ulcer    GERD 02/22/2008   History of blood transfusion 1988   "lost ~ 1/2 of my blood volume from multiple bleeding ulcers"   History of hiatal hernia    HYPERGLYCEMIA 11/18/2007   HYPERLIPIDEMIA 02/22/2008   HYPERTENSION, UNSPECIFIED 11/10/2009   Kidney stones    Osteoarthritis    c-spine   PEPTIC ULCER DISEASE 11/17/2007   Situational depression    "son died in [place] July 07, 2015"   TIA (transient ischemic attack)    "not that I know of in the past; they are trying to determine if I've had one today (08/10/2015)"   Past Surgical History:  Procedure Laterality Date   CATARACT EXTRACTION W/ INTRAOCULAR LENS  IMPLANT, BILATERAL Bilateral 2013   Patient Active Problem List   Diagnosis Date Noted   Ankylosing spondylitis of cervicothoracic region ([place]) 10/16/2016   TIA (transient ischemic attack) 08/10/2015   Carotid stenosis 08/04/2014   Hyperlipidemia 02/21/2012   BENIGN PROSTATIC HYPERTROPHY, WITH OBSTRUCTION 01/15/2010   Essential hypertension 11/10/2009   GERD 02/22/2008   NEPHROLITHIASIS, HX OF 11/17/2007    ONSET DATE: referral date 07/04/22   REFERRING DIAG: G20.C (ICD-10-CM) - Parkinsonism, unspecified Z91.81 (ICD-10-CM) - History of  falling  THERAPY DIAG:  Other symptoms and signs involving the nervous system  Muscle weakness (generalized)  Unsteadiness on feet  Abnormal posture  Rationale for Evaluation and Treatment: Rehabilitation  SUBJECTIVE:   SUBJECTIVE STATEMENT: Pt reports that his endurance and strength varies day to day and based on certain movements.    Pt accompanied by: self and significant other  PERTINENT HISTORY: history of Parkinson's and ankylosing spondylitis   PRECAUTIONS: Fall  WEIGHT BEARING RESTRICTIONS: No  PAIN:  Are you having pain? Yes: NPRS scale: 3/10 Pain location: L hamstrings Pain description: tight, cramping Aggravating factors: crossing leg into figure 4  Relieving factors: uncrossing leg  FALLS: Has patient fallen in last 6 months? Yes. Number of falls 6 (3-4 since "big fall" in July 07, 2023)  LIVING ENVIRONMENT: Lives with: lives with their spouse Lives in: House/apartment Stairs: Yes: External: 2-3 steps; has had a ramp built just in last week Internal: steps to basement and steps to loft - however pt does not need to go up/down those at this time. Has following equipment at home: [employer] Rile - 2 wheeled, bed side commode, Ramped entry, and transport chair  PLOF: Independent  PATIENT GOALS: to be able to feel confident in any sort of movement to resume prior activities  OBJECTIVE:   HAND DOMINANCE: Right  ADLs: Overall ADLs: Prior to fall in 2022/07/06, pt Independent with  all ADLs and IADLs without use of AE/DME, even completing shower in standing in walk-in shower. Transfers/ambulation related to ADLs: fluctuates with mobility; now using RW for all mobility, Supervision with all mobility Eating: Mod I Grooming: wife will assist with opening items; otherwise setup and supervision due to balance UB Dressing: Min-mod assist, buttons/zippers are quite challenging LB Dressing: Mod assist, is able to don socks/shoes with increased time Toileting: Max assist Bathing:  sponge baths since fall, max assist from wife at this time Tub Shower transfers: has walk-in shower, but does not feel safe in shower since fall in Oct Equipment: bed side commode  IADLs: Meal Prep: since fall in October 2023, pt has been assisting with 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  Pt currently cutting foods for meal prep in sitting position. Medication management: wife assisting Handwriting:  TBA  MOBILITY STATUS: Needs Assist: CGA with mobility with RW and Hx of falls  POSTURE COMMENTS:  rounded shoulders and forward head  FUNCTIONAL OUTCOME MEASURES: Physical performance test: PPT #4 donning/doffing jacket: unable to complete due to LOB* 5 time sit > stand:  1:05.94 with up to mod assist and cues to scoot forward towards edge of chair and needing to push up from RW.  UPPER EXTREMITY ROM:  all movements are slowed, decreased shoulder flexion d/t ankylosing spondylitis  Active ROM Right eval Left eval  Shoulder flexion 90 90  Shoulder abduction    Shoulder adduction    Shoulder extension    Shoulder internal rotation [facility]  Shoulder external rotation [facility]  Elbow flexion [facility] WFL  Elbow extension [facility] WFL  Wrist flexion    Wrist extension    Wrist ulnar deviation    Wrist radial deviation    Wrist pronation    Wrist supination    (Blank rows = not tested)  UPPER EXTREMITY MMT:     MMT Right eval Left eval  Shoulder flexion 4/5 4/5  Shoulder abduction    Shoulder adduction    Shoulder extension    Shoulder internal rotation    Shoulder external rotation    Middle trapezius    Lower trapezius    Elbow flexion 4/5 4/5  Elbow extension 4/5 4/5  Wrist flexion    Wrist extension    Wrist ulnar deviation    Wrist radial deviation    Wrist pronation    Wrist supination    (Blank rows = not tested)  HAND FUNCTION: TBD  COORDINATION: Slowed movements, TBA at future session  COGNITION: Overall cognitive status:  Pt's spouse reports that his thinking  seems slower, but attributes it to stress from all issues presenting s/p fall in October  VISION: Subjective report: glasses broke with recent fall Baseline vision: Wears glasses all the time Visual history: cataracts  VISION ASSESSMENT: Not tested  Patient has difficulty with following activities due to following visual impairments: currently difficult to assess as pt does not have glasses with him, as they broke during fall in October 2023.  OBSERVATIONS: Pt with slowed response time and movements during transfers to/from treatment space and during assessment.   TODAY'S TREATMENT:    07/29/22 Transitional movements: Pt completing all sit <> stand without pulling up on RW this session, with significant improvements from prior session.  OT educating pt on RW placement with transfers as he has tendency to push it too far forward and away during transfer to chair.  OT educated on fall risk and recommending pt maintain RW in front of  self as he turns to sit in chair/toilet, etc.  Pt demonstrating with mod cues to ensure proper technique. LB dressing: OT educated on figure 4 positioning as well as use of reacher for LB dressing.  Pt completed simulated LB dressing with gait belt and then trash bag to practice sequencing.  Pt continues to benefit from mod cues for sequencing and technique, however with repetition pt should demonstrate increased technique and ease.  Pt reports tightness in hamstrings in figure 4 position, but able to complete with min effort.  OT educated on long handled sponge/loofah for bathing, recommending for increased independence and ease with washing legs and back.  Pt demonstrated understanding.   07/24/22 5 time sit > stand: 1:05.94 with up to mod assist and cues to scoot forward towards edge of chair and needing to push up from RW.  OT provided cues and instruction for hand placement and anterior weight shift during sit > stand to decrease tendency to push up on RW.  Pt  completed with massed practice and decreasing assist to close supervision without arm rests or pushing up from RW. Jacket: Attempted to complete #4 of PPT however pt requiring > 26 seconds before OT stopping timer.  Pt able to thread single UE before losing balance while attempting to thread LUE.  OT providing mod assist to lower pt to chair. Reviewed AE/DME: discussed with pt and wife recommendations for grab bars in bathroom.  Discussed installed grab bars vs suction cup grab bars for increased safety.  Pt's spouse reports bathroom shower may need to be remodeled to allow for installed grab bars.  Discussed shower chair with/without arm rests vs BSC vs tub bench for seat in walk-in shower.  OT provided pt and spouse with pictures of options and encouraged them to look at options and take measurements of shower to allow for appropriate equipment and practice during future sessions.   Eval Educated on bathroom DME/AE with plans to further practice and recommend, based on pt needs.                                               PATIENT EDUCATION: Education details: Educated on role and purpose of OT as well as potential interventions and goals for therapy based on initial evaluation findings. Person educated: Patient and Spouse Education method: Explanation Education comprehension: verbalized understanding and needs further education  HOME EXERCISE PROGRAM: TBD   GOALS: Goals reviewed with patient? Yes  SHORT TERM GOALS: Target date: 08/16/22  Pt and wife will report understanding of adaptive techniques and/or potential DME/AE needs to increase ease, safety, and independence w/ ADLs. Baseline: Goal status: IN PROGRESS  2.  Pt will perform dynamic standing task for 5 mins as needed for simple IADLs w/o LOB using DME and/or countertop support prn  Baseline: decreased standing tolerance Goal status: IN PROGRESS  3.  Pt will demonstrate improved ease with sit > stand to get up from toilet or  other seating options in home by improving score on sit > stand to decreased fall risk. Baseline: 1:05.94 Goal status: IN PROGRESS  4.  Pt will complete UB dressing (except clothing fasteners) with supervision. Baseline: wife assists with buttons occasionally prior to fall Goal status: IN PROGRESS   LONG TERM GOALS: Target date: 09/13/22  Pt will demonstrate shower transfers with DME PRN at supervision level. Baseline: currently  sponge bathing due to fearfulness of showering, but wants to return to showers.   Goal status: IN PROGRESS  2.  Pt will report increased participation in IADLs (such as cooking) to increase independence with functional IADLs.  Baseline:  Goal status: IN PROGRESS  3.  Pt will complete UB dressing, to include clothing fasteners, at Mod I level with use of AE and/or alternative strategies PRN. Baseline:  Goal status: INITIAL  4.  Pt will complete LB dressing with Supervision/setup with use of AE and/or alternative strategies PRN. Baseline:  Goal status: IN PROGRESS  5.  Pt will demonstrate improved dynamic standing balance for 15 mins as needed to engage in ADLs/IADLs with increased safety/endurance.   Baseline:  Goal status: IN PROGRESS  6. Pt will demonstrate increased ease with dressing as evidenced by decreasing PPT#4(don/ doff jacket) by 10 secs or more.  Baseline: TBD  Goal status: IN PROGRESS  ASSESSMENT:  CLINICAL IMPRESSION: Pt demonstrating good carryover of education on hand placement and anterior weight shift during sit > stand by standing without any cues or use of RW this session.  Pt and spouse receptive to AE to increase independence and ease with LB bathing and dressing.  Pt will continue to benefit from practice in clinic and at home to facilitate increased carryover and independence.  PERFORMANCE DEFICITS: in functional skills including ADLs, IADLs, ROM, strength, pain, flexibility, Fine motor control, Gross motor control, mobility,  balance, body mechanics, endurance, decreased knowledge of precautions, decreased knowledge of use of DME, and UE functional use, cognitive skills including attention, memory, safety awareness, sequencing, and thought, and psychosocial skills including environmental adaptation and routines and behaviors.   IMPAIRMENTS: are limiting patient from ADLs and IADLs.   CO-MORBIDITIES: may have co-morbidities  that affects occupational performance. Patient will benefit from skilled OT to address above impairments and improve overall function.  MODIFICATION OR ASSISTANCE TO COMPLETE EVALUATION: Min-Moderate modification of tasks or assist with assess necessary to complete an evaluation.  OT OCCUPATIONAL PROFILE AND HISTORY: Detailed assessment: Review of records and additional review of physical, cognitive, psychosocial history related to current functional performance.  CLINICAL DECISION MAKING: Moderate - several treatment options, min-mod task modification necessary  REHAB POTENTIAL: Good  EVALUATION COMPLEXITY: Moderate    PLAN:  OT FREQUENCY: 2x/week  OT DURATION: 8 weeks  PLANNED INTERVENTIONS: self care/ADL training, therapeutic exercise, therapeutic activity, neuromuscular re-education, manual therapy, balance training, functional mobility training, ultrasound, biofeedback, compression bandaging, moist heat, cryotherapy, patient/family education, cognitive remediation/compensation, visual/perceptual remediation/compensation, psychosocial skills training, energy conservation, coping strategies training, and DME and/or AE instructions  RECOMMENDED OTHER SERVICES: N/A  CONSULTED AND AGREED WITH PLAN OF CARE: Patient and family member/caregiver  PLAN FOR NEXT SESSION:  Reiterate DME/AE for bathroom transfers and safety/independence with ADLs/IADLs. Practice shower transfers with stepping over shower ledge and continue to discuss DME/AE for bathroom transfers and dressing tasks.   Simonne Come, OTR/L 07/29/2022, 4:42 PM

## 2022-07-29 NOTE — Therapy (Signed)
OUTPATIENT PHYSICAL THERAPY NEURO TREATMENT   Patient Name: [name] MRN: [id number] DOB:07/03/1936, 86 y.o., male Today's Date: 07/29/2022   PCP: Donnajean Lopes, MD REFERRING PROVIDER: Star Age, MD  END OF SESSION:  PT End of Session - 07/29/22 1451     Visit Number 3    Number of Visits 16    Date for PT Re-Evaluation 09/10/22    Authorization Type Medicare    Progress Note Due on Visit 10    PT Start Time 1445    PT Stop Time 3968    PT Time Calculation (min) 45 min    Equipment Utilized During Treatment Gait belt    Activity Tolerance Patient tolerated treatment well    Behavior During Therapy WFL for tasks assessed/performed              Past Medical History:  Diagnosis Date   Ankylosing spondylitis ([place]) dx'd ~ 1974   Arthritis    "back" (08/10/2015)   BENIGN PROSTATIC HYPERTROPHY, WITH OBSTRUCTION 01/15/2010   Bleeding duodenal ulcer    Bleeding esophageal ulcer    Bleeding stomach ulcer    GERD 02/22/2008   History of blood transfusion 1988   "lost ~ 1/2 of my blood volume from multiple bleeding ulcers"   History of hiatal hernia    HYPERGLYCEMIA 11/18/2007   HYPERLIPIDEMIA 02/22/2008   HYPERTENSION, UNSPECIFIED 11/10/2009   Kidney stones    Osteoarthritis    c-spine   PEPTIC ULCER DISEASE 11/17/2007   Situational depression    "son died in [place] 2015-06-17"   TIA (transient ischemic attack)    "not that I know of in the past; they are trying to determine if I've had one today (08/10/2015)"   Past Surgical History:  Procedure Laterality Date   CATARACT EXTRACTION W/ INTRAOCULAR LENS  IMPLANT, BILATERAL Bilateral 2013   Patient Active Problem List   Diagnosis Date Noted   Ankylosing spondylitis of cervicothoracic region ([place]) 10/16/2016   TIA (transient ischemic attack) 08/10/2015   Carotid stenosis 08/04/2014   Hyperlipidemia 02/21/2012   BENIGN PROSTATIC HYPERTROPHY, WITH OBSTRUCTION 01/15/2010   Essential hypertension 11/10/2009   GERD  02/22/2008   NEPHROLITHIASIS, HX OF 11/17/2007    ONSET DATE: 2022/06/16, PD for 4 years  REFERRING DIAG: Z91.81 (ICD-10-CM) - History of recent fall G20.C (ICD-10-CM) - Primary parkinsonism  THERAPY DIAG:  Muscle weakness (generalized)  Unsteadiness on feet  Abnormal posture  Other abnormalities of gait and mobility  Difficulty in walking, not elsewhere classified  Rationale for Evaluation and Treatment: Rehabilitation  SUBJECTIVE:  SUBJECTIVE STATEMENT: Had a fall on Friday night, fell backwards. Bruised his back but did not hit his head. Feels a little dizzy when getting out of bed and standing up.     Pt accompanied by: significant other  PERTINENT HISTORY: PD, complex medical history of peptic ulcer disease with history of upper GI bleed, TIA, depression, degenerative disc disease in the neck, osteoarthritis, kidney stones, hyperlipidemia, hypertension, history of hiatal hernia, BPH, arthritis, and ankylosing spondylitis, and parkinsonism  PAIN:  Are you having pain? No, none more than usual  PRECAUTIONS: None  WEIGHT BEARING RESTRICTIONS: No  FALLS: Has patient fallen in last 6 months? Yes. Number of falls "several"  LIVING ENVIRONMENT: Lives with: lives with their spouse Lives in: House/apartment Stairs:  stairs inside home to loft, ramp to enter/exit home Has following equipment at home: Gilford Rile - 2 wheeled, since the fall in October  PLOF: [place]: return to PLOF  OBJECTIVE:   TODAY'S TREATMENT: 07/29/22 Activity Comments  Figure 4 ROM in supine For piriformis stretch  DKTC stretch Good ROM and tolerance  Supine PWR! moves -twist 10x -step 2x (experiencing right hamstring cramp -bridge 1x5 to correct form (hamstring cramp)  Supine posture stretch  -thoracic extension mobilization  Seated Shoulder Horizontal Abduction with Resistance 2x10 w/ red band, frequent cues for isolation and form        TODAY'S TREATMENT: 07/24/22  COORDINATION Alternating pronation/supination: B mod-severe bradykinesia and dysmetria Alternating toe tap: R LE dysmetria Finger to nose: intact B   VESTIBULAR - OCULAR REFLEX:    VOR Cancellation: corrective saccades B   Head-Impulse Test: unable to effectively test d/t guarding and trouble following instruction      POSITIONAL TESTING:  Right Sidelying: negative Left Sidelying: negative    Activity Comments  Gait training with RW from waiting room and within treatment room  Cueing to increased L>R step length; min A required to maneuver walker  Standing with walker in front, wall behind: Standing balance 30" Romberg 30" Standing EC 30" Standing wt shifts forward 5x Cueing to shift wt over toes, push belly button forward to correct retropulsion   STS with walker in front 6x Cues to scoot forward, tuck feet behind, hand placement; CGA- min A; tendency for retropulsion    HOME EXERCISE PROGRAM Last updated: 07/24/22 Access Code: [email or web address] URL: [email or web address] Date: 07/24/2022 Prepared by: [facility]  Program Notes perform with gait belt and wife's supervision  Exercises - Standing Balance in Corner  - 1 x daily - 7 x weekly - 3 sets - 30 sec hold - Narrow Stance with Counter Support  - 1 x daily - 5 x weekly - 2 sets - 10 reps - 30 sec hold - Standing Balance in Corner with Eyes Closed  - 1 x daily - 5 x weekly - 2 sets - 10 reps - 30 sec hold - Sit to Stand with Counter Support  - 1 x daily - 5 x weekly - 2 sets - 5-10 reps - Supine Lower Trunk Rotation  - 1 x daily - 7 x weekly - 3 sets - 10 reps - Supine Bridge  - 1 x daily - 7 x weekly - 3 sets - 10 reps - Thoracic Extension Mobilization on Foam Roll  - 1 x daily - 7 x weekly - 3  sets - 10 reps - 3 sec hold - Seated Shoulder Horizontal Abduction with Resistance  - 1 x daily -  7 x weekly - 3 sets - 10 reps    PATIENT EDUCATION: Education details: review/clarification and update to HEP- to be performed with gait belt, wife's supervision, walker in front, and wall behind for max safety Person educated: Patient and Spouse Education method: Explanation, Demonstration, Tactile cues, Verbal cues, and Handouts Education comprehension: verbalized understanding and returned demonstration    Below measures were taken at time of initial evaluation unless otherwise specified:   DIAGNOSTIC FINDINGS: no intracranial or osseous injuries  COGNITION: Overall cognitive status: Within functional limits for tasks assessed   SENSATION: WFL  COORDINATION: Alternating movements impaired, right ankle limitations pre-morbid from hx of crush injury in childhood Heel to shin mildly impaired right > left Finger to nose WNL Finger opposition: unable to perform bilaterally  EDEMA:  none  MUSCLE TONE: NT  MUSCLE LENGTH: Presents with bilateral knee flexion contractures, approx 5-10 degrees  DTRs:    POSTURE: rounded shoulders, forward head, and increased thoracic kyphosis  LOWER EXTREMITY ROM:     Active  Right Eval Left Eval  Hip flexion    Hip extension    Hip abduction    Hip adduction    Hip internal rotation    Hip external rotation    Knee flexion    Knee extension -10 -10  Ankle dorsiflexion 5 10  Ankle plantarflexion    Ankle inversion    Ankle eversion     (Blank rows = not tested)  LOWER EXTREMITY MMT:  assessed in sitting  MMT Right Eval Left Eval  Hip flexion 4 4  Hip extension    Hip abduction 3+ 3+  Hip adduction 4- 4-  Hip internal rotation    Hip external rotation    Knee flexion 4 4  Knee extension 4- 4-  Ankle dorsiflexion 2+ 3+  Ankle plantarflexion    Ankle inversion    Ankle eversion    (Blank rows = not tested)  BED  MOBILITY:  Mod A  TRANSFERS: Assistive device utilized: Environmental consultant - 2 wheeled  Sit to stand: CGA and Min A Stand to sit: SBA Chair to chair: CGA and Min A Floor:  DNT  RAMP:  NT  CURB:  Level of Assistance: CGA Assistive device utilized: Environmental consultant - 2 wheeled Curb Comments:   STAIRS: NT  GAIT: Gait pattern:  right knee extensor thrust in stance, shuffling, and trunk flexed, deficits during turning Distance walked:  Assistive device utilized: Environmental consultant - 2 wheeled Level of assistance: CGA and Min A Comments: unsteady, deficits during turns  FUNCTIONAL TESTS:  5 times sit to stand: unable to complete, retro-pulsion Timed up and go (TUG): 29 sec Push and release test (# of steps): Anterior: absent  Posterior: absent  M-CTSIB  Condition 1: Firm Surface, EO 30 Sec, Mild Sway  Condition 2: Firm Surface, EC 30 Sec, Mild and Moderate Sway  Condition 3: Foam Surface, EO 0 Sec,  unable  Sway  Condition 4: Foam Surface, EC - Sec,  -  Sway     VESTIBULAR ASSESSMENT   GENERAL OBSERVATION: right eyebrow ptosis?    SYMPTOM BEHAVIOR:   Subjective history: fall and strike to anterior right forehead   Non-Vestibular symptoms: denies   Type of dizziness: Imbalance (Disequilibrium) and Lightheadedness/Faint   Frequency: "depends"   Duration: "whenever"   Aggravating factors: No known aggravating factors   Relieving factors: no known relieving factors   Progression of symptoms: unchanged   OCULOMOTOR EXAM:   Ocular Alignment: right eye ptosis  Ocular ROM: No Limitations   Spontaneous Nystagmus: absent   Gaze-Induced Nystagmus: age appropriate nystagmus at end range   Smooth Pursuits: saccades   Saccades: hypometric/undershoots, extra eye movements, and slow   Convergence/Divergence: 10 inches      VESTIBULAR - OCULAR REFLEX:    Slow VOR: Comment: difficulty in tracking   VOR Cancellation: Comment: NT   Head-Impulse Test: NT   Dynamic Visual Acuity: Not able to be  assessed    POSITIONAL TESTING: unable to test due to time limitation    MOTION SENSITIVITY:    Motion Sensitivity Quotient  Intensity: 0 = none, 1 = Lightheaded, 2 = Mild, 3 = Moderate, 4 = Severe, 5 = Vomiting  Intensity  1. Sitting to supine   2. Supine to L side   3. Supine to R side   4. Supine to sitting   5. L Hallpike-Dix   6. Up from L    7. R Hallpike-Dix   8. Up from R    9. Sitting, head  tipped to L knee   10. Head up from L  knee   11. Sitting, head  tipped to R knee   12. Head up from R  knee   13. Sitting head turns x5   14.Sitting head nods x5   15. In stance, 180  turn to L    16. In stance, 180  turn to R     OTHOSTATICS: not done  FUNCTIONAL GAIT:    VESTIBULAR TREATMENT:  Canalith Repositioning:   TBD Gaze Adaptation:   TBD Habituation:   TBD Other:  PATIENT SURVEYS:    PATIENT EDUCATION: Education details: assessment findings/interpretations Person educated: Patient and Spouse Education method: Explanation Education comprehension: verbalized understanding  HOME EXERCISE PROGRAM: Access Code: [email or web address] URL: [email or web address] Date: 07/16/2022 Prepared by: Sherlyn Lees  Exercises - Standing Balance in [place]  - 1 x daily - 7 x weekly - 3 sets - 10-30 sec hold   GOALS: Goals reviewed with patient? Yes  SHORT TERM GOALS: Target date: 08/13/2022    Patient will perform HEP with family/caregiver supervision for improved strength, balance, transfers, gaze adaptation, habituation, balance, and gait  Baseline: Goal status: IN PROGRESS  2.  Demonstrate functional transfers with supervision level with or without AD to improve safety with functional mobility Baseline: CGA-min A Goal status: IN PROGRESS  3.  Manifest improved BLE strength as evidenced by ability to complete 5xSTS test in 25 sec to improve safety with mobility Baseline: unable Goal status: IN PROGRESS   LONG TERM GOALS: Target date:  09/10/2022    Demonstrate reduce risk for falls per time 15 sec TUG test with least restrictive AD Baseline: 29 sec w/ RW Goal status: IN PROGRESS  2.  Demo ability to transfer and ambulate over various surfaces x 300 ft with modified independence to reduce level of assistance from caregiver Baseline: CGA-min A w/ RW Goal status: IN PROGRESS  3.  Demonstrate improved reactive balance as evidenced by ability to enact 2-3 steps with push-release test to facilitate righting reactions and reduce risk for falls Baseline: Absent, LOB Goal status: IN PROGRESS   ASSESSMENT:  CLINICAL IMPRESSION: Activities focus on improving strength, motor control, and coordination with emphasis on positional changes as pt notes difficulty with transitioning in/out of bed due to freezing. Initiated supine activities to address this but difficulty in maintaining activity due to persistent hamstring cramping. Continued with postural re-education activities to improve thoracic extension ROM and then followed with  postural strengthening to improve upright capabilities/posture for improve balance. Continued sessions to address deficits and limitations  OBJECTIVE IMPAIRMENTS: Abnormal gait, decreased activity tolerance, decreased balance, decreased coordination, decreased knowledge of use of DME, difficulty walking, decreased ROM, decreased strength, dizziness, impaired flexibility, improper body mechanics, and postural dysfunction.   ACTIVITY LIMITATIONS: carrying, lifting, bending, standing, squatting, stairs, transfers, bed mobility, bathing, dressing, reach over head, and locomotion level  PARTICIPATION LIMITATIONS: meal prep, cleaning, laundry, shopping, community activity, and yard work  PERSONAL FACTORS: Age, Time since onset of injury/illness/exacerbation, and 3+ comorbidities: see medical hx  are also affecting patient's functional outcome.   REHAB POTENTIAL: Good  CLINICAL DECISION MAKING:  Evolving/moderate complexity  EVALUATION COMPLEXITY: Moderate  PLAN:  PT FREQUENCY: 2x/week  PT DURATION: 8 weeks  PLANNED INTERVENTIONS: Therapeutic exercises, Therapeutic activity, Neuromuscular re-education, Balance training, Gait training, Patient/Family education, Self Care, Joint mobilization, Stair training, Vestibular training, Canalith repositioning, DME instructions, Aquatic Therapy, Dry Needling, Electrical stimulation, Wheelchair mobility training, Spinal mobilization, Cryotherapy, Moist heat, Taping, and Manual therapy  PLAN FOR NEXT SESSION: supine/seated PWR! Moves, corner balance trials    3:47 PM, 07/29/22 M. Sherlyn Lees, PT, DPT Physical Therapist- [place] Office Number: [phone]   [facility] [address] [place], [place] [address] Phone # [phone] Fax # [phone]

## 2022-07-31 ENCOUNTER — Ambulatory Visit: Payer: Medicare Other | Admitting: Occupational Therapy

## 2022-07-31 ENCOUNTER — Ambulatory Visit: Payer: Medicare Other

## 2022-07-31 DIAGNOSIS — R2689 Other abnormalities of gait and mobility: Secondary | ICD-10-CM | POA: Diagnosis not present

## 2022-07-31 DIAGNOSIS — R293 Abnormal posture: Secondary | ICD-10-CM | POA: Diagnosis not present

## 2022-07-31 DIAGNOSIS — M6281 Muscle weakness (generalized): Secondary | ICD-10-CM

## 2022-07-31 DIAGNOSIS — R262 Difficulty in walking, not elsewhere classified: Secondary | ICD-10-CM

## 2022-07-31 DIAGNOSIS — R29818 Other symptoms and signs involving the nervous system: Secondary | ICD-10-CM | POA: Diagnosis not present

## 2022-07-31 DIAGNOSIS — R2681 Unsteadiness on feet: Secondary | ICD-10-CM | POA: Diagnosis not present

## 2022-07-31 NOTE — Therapy (Signed)
OUTPATIENT OCCUPATIONAL THERAPY Treatment Session  Patient Name: [name] MRN: [id number] DOB:June 03, 1936, 86 y.o., male Today's Date: 07/31/2022  PCP: Donnajean Lopes, MD REFERRING PROVIDER: Star Age, MD  END OF SESSION:  OT End of Session - 07/31/22 1623     Visit Number 4    Number of Visits 17    Date for OT Re-Evaluation 09/13/22    Authorization Type Medicare A &B    OT Start Time 1448    OT Stop Time 4481    OT Time Calculation (min) 42 min                Past Medical History:  Diagnosis Date   Ankylosing spondylitis ([place]) dx'd ~ 1974   Arthritis    "back" (08/10/2015)   BENIGN PROSTATIC HYPERTROPHY, WITH OBSTRUCTION 01/15/2010   Bleeding duodenal ulcer    Bleeding esophageal ulcer    Bleeding stomach ulcer    GERD 02/22/2008   History of blood transfusion 1988   "lost ~ 1/2 of my blood volume from multiple bleeding ulcers"   History of hiatal hernia    HYPERGLYCEMIA 11/18/2007   HYPERLIPIDEMIA 02/22/2008   HYPERTENSION, UNSPECIFIED 11/10/2009   Kidney stones    Osteoarthritis    c-spine   PEPTIC ULCER DISEASE 11/17/2007   Situational depression    "son died in [place] 06/23/2015"   TIA (transient ischemic attack)    "not that I know of in the past; they are trying to determine if I've had one today (08/10/2015)"   Past Surgical History:  Procedure Laterality Date   CATARACT EXTRACTION W/ INTRAOCULAR LENS  IMPLANT, BILATERAL Bilateral 2013   Patient Active Problem List   Diagnosis Date Noted   Ankylosing spondylitis of cervicothoracic region ([place]) 10/16/2016   TIA (transient ischemic attack) 08/10/2015   Carotid stenosis 08/04/2014   Hyperlipidemia 02/21/2012   BENIGN PROSTATIC HYPERTROPHY, WITH OBSTRUCTION 01/15/2010   Essential hypertension 11/10/2009   GERD 02/22/2008   NEPHROLITHIASIS, HX OF 11/17/2007    ONSET DATE: referral date 07/04/22   REFERRING DIAG: G20.C (ICD-10-CM) - Parkinsonism, unspecified Z91.81 (ICD-10-CM) - History of  falling  THERAPY DIAG:  Muscle weakness (generalized)  Unsteadiness on feet  Abnormal posture  Other abnormalities of gait and mobility  Rationale for Evaluation and Treatment: Rehabilitation  SUBJECTIVE:   SUBJECTIVE STATEMENT: Pt reports difficulty getting in/out of bed and turning over in bed.  Pt accompanied by: self and significant other  PERTINENT HISTORY: history of Parkinson's and ankylosing spondylitis   PRECAUTIONS: Fall  WEIGHT BEARING RESTRICTIONS: No  PAIN:  Are you having pain? Yes: NPRS scale: 3/10 Pain location: L hamstrings Pain description: tight, cramping Aggravating factors: crossing leg into figure 4  Relieving factors: uncrossing leg  FALLS: Has patient fallen in last 6 months? Yes. Number of falls 6 (3-4 since "big fall" in 23-Jun-2023)  LIVING ENVIRONMENT: Lives with: lives with their spouse Lives in: House/apartment Stairs: Yes: External: 2-3 steps; has had a ramp built just in last week Internal: steps to basement and steps to loft - however pt does not need to go up/down those at this time. Has following equipment at home: Gil[employer] Rile - 2 wheeled, bed side commode, Ramped entry, and transport chair  PLOF: Independent  PATIENT GOALS: to be able to feel confident in any sort of movement to resume prior activities  OBJECTIVE:   HAND DOMINANCE: Right  ADLs: Overall ADLs: Prior to fall in June 22, 2022, pt Independent with all ADLs and IADLs without use of  AE/DME, even completing shower in standing in walk-in shower. Transfers/ambulation related to ADLs: fluctuates with mobility; now using RW for all mobility, Supervision with all mobility Eating: Mod I Grooming: wife will assist with opening items; otherwise setup and supervision due to balance UB Dressing: Min-mod assist, buttons/zippers are quite challenging LB Dressing: Mod assist, is able to don socks/shoes with increased time Toileting: Max assist Bathing: sponge baths since fall, max assist from  wife at this time Tub Shower transfers: has walk-in shower, but does not feel safe in shower since fall in Oct Equipment: bed side commode  IADLs: Meal Prep: since fall in October 2023, pt has been assisting with 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  Pt currently cutting foods for meal prep in sitting position. Medication management: wife assisting Handwriting:  TBA  MOBILITY STATUS: Needs Assist: CGA with mobility with RW and Hx of falls  POSTURE COMMENTS:  rounded shoulders and forward head  FUNCTIONAL OUTCOME MEASURES: Physical performance test: PPT #4 donning/doffing jacket: unable to complete due to LOB* 5 time sit > stand:  1:05.94 with up to mod assist and cues to scoot forward towards edge of chair and needing to push up from RW.  UPPER EXTREMITY ROM:  all movements are slowed, decreased shoulder flexion d/t ankylosing spondylitis  Active ROM Right eval Left eval  Shoulder flexion 90 90  Shoulder abduction    Shoulder adduction    Shoulder extension    Shoulder internal rotation [facility]  Shoulder external rotation [facility]  Elbow flexion [facility] WFL  Elbow extension [facility] WFL  Wrist flexion    Wrist extension    Wrist ulnar deviation    Wrist radial deviation    Wrist pronation    Wrist supination    (Blank rows = not tested)  UPPER EXTREMITY MMT:     MMT Right eval Left eval  Shoulder flexion 4/5 4/5  Shoulder abduction    Shoulder adduction    Shoulder extension    Shoulder internal rotation    Shoulder external rotation    Middle trapezius    Lower trapezius    Elbow flexion 4/5 4/5  Elbow extension 4/5 4/5  Wrist flexion    Wrist extension    Wrist ulnar deviation    Wrist radial deviation    Wrist pronation    Wrist supination    (Blank rows = not tested)  HAND FUNCTION: TBD  COORDINATION: Slowed movements, TBA at future session  COGNITION: Overall cognitive status:  Pt's spouse reports that his thinking seems slower, but attributes it to stress  from all issues presenting s/p fall in October  VISION: Subjective report: glasses broke with recent fall Baseline vision: Wears glasses all the time Visual history: cataracts  VISION ASSESSMENT: Not tested  Patient has difficulty with following activities due to following visual impairments: currently difficult to assess as pt does not have glasses with him, as they broke during fall in October 2023.  OBSERVATIONS: Pt with slowed response time and movements during transfers to/from treatment space and during assessment.   TODAY'S TREATMENT:    07/31/22 Engaged in bed mobility with focus on large amplitude movements with swinging legs in to bed, bridging and scooting hips prior to rolling over, positioning prior to transitioning from sit > supine in bed.  OT providing demonstration and mod verbal and tactile cues for sequencing.  Pt requiring tactile cues at legs for large amplitude stepping/lifting pattern when bringing legs in to bed and when positioning  in bed prior to rolling on to side.  Engaged in bridging and scooting hips R and L prior to rolling to facilitate increased alignment prior to rolling.  Pt demonstrating improved sequencing of sit <> sidelying as well as scooting over in bed with min cues and repetition. AE: discussed 3 in 1 vs shower chair with arm rests for walk-in shower at home.  Pt would benefit form shower hair with arm rests as neither pt nor spouse need to be moving 3 in 1 in/out of shower due to fall risk.  OT demonstrated and educated on side stepping method for shower transfers, will benefit from practice with stepping over ~5 inch ledge.   07/29/22 Transitional movements: Pt completing all sit <> stand without pulling up on RW this session, with significant improvements from prior session.  OT educating pt on RW placement with transfers as he has tendency to push it too far forward and away during transfer to chair.  OT educated on fall risk and recommending pt  maintain RW in front of self as he turns to sit in chair/toilet, etc.  Pt demonstrating with mod cues to ensure proper technique. LB dressing: OT educated on figure 4 positioning as well as use of reacher for LB dressing.  Pt completed simulated LB dressing with gait belt and then trash bag to practice sequencing.  Pt continues to benefit from mod cues for sequencing and technique, however with repetition pt should demonstrate increased technique and ease.  Pt reports tightness in hamstrings in figure 4 position, but able to complete with min effort.  OT educated on long handled sponge/loofah for bathing, recommending for increased independence and ease with washing legs and back.  Pt demonstrated understanding.   07/24/22 5 time sit > stand: 1:05.94 with up to mod assist and cues to scoot forward towards edge of chair and needing to push up from RW.  OT provided cues and instruction for hand placement and anterior weight shift during sit > stand to decrease tendency to push up on RW.  Pt completed with massed practice and decreasing assist to close supervision without arm rests or pushing up from RW. Jacket: Attempted to complete #4 of PPT however pt requiring > 26 seconds before OT stopping timer.  Pt able to thread single UE before losing balance while attempting to thread LUE.  OT providing mod assist to lower pt to chair. Reviewed AE/DME: discussed with pt and wife recommendations for grab bars in bathroom.  Discussed installed grab bars vs suction cup grab bars for increased safety.  Pt's spouse reports bathroom shower may need to be remodeled to allow for installed grab bars.  Discussed shower chair with/without arm rests vs BSC vs tub bench for seat in walk-in shower.  OT provided pt and spouse with pictures of options and encouraged them to look at options and take measurements of shower to allow for appropriate equipment and practice during future sessions.                                           PATIENT EDUCATION: Education details: Educated on large amplitude movements during transitional movements and bed mobility Person educated: Patient and Spouse Education method: Consulting civil engineer, Media planner, and Verbal cues Education comprehension: verbalized understanding and needs further education  HOME EXERCISE PROGRAM: TBD   GOALS: Goals reviewed with patient? Yes  SHORT TERM GOALS: Target date:  08/16/22  Pt and wife will report understanding of adaptive techniques and/or potential DME/AE needs to increase ease, safety, and independence w/ ADLs. Baseline: Goal status: IN PROGRESS  2.  Pt will perform dynamic standing task for 5 mins as needed for simple IADLs w/o LOB using DME and/or countertop support prn  Baseline: decreased standing tolerance Goal status: IN PROGRESS  3.  Pt will demonstrate improved ease with sit > stand to get up from toilet or other seating options in home by improving score on sit > stand to decreased fall risk. Baseline: 1:05.94 Goal status: IN PROGRESS  4.  Pt will complete UB dressing (except clothing fasteners) with supervision. Baseline: wife assists with buttons occasionally prior to fall Goal status: IN PROGRESS   LONG TERM GOALS: Target date: 09/13/22  Pt will demonstrate shower transfers with DME PRN at supervision level. Baseline: currently sponge bathing due to fearfulness of showering, but wants to return to showers.   Goal status: IN PROGRESS  2.  Pt will report increased participation in IADLs (such as cooking) to increase independence with functional IADLs.  Baseline:  Goal status: IN PROGRESS  3.  Pt will complete UB dressing, to include clothing fasteners, at Mod I level with use of AE and/or alternative strategies PRN. Baseline:  Goal status: INITIAL  4.  Pt will complete LB dressing with Supervision/setup with use of AE and/or alternative strategies PRN. Baseline:  Goal status: IN PROGRESS  5.  Pt will demonstrate  improved dynamic standing balance for 15 mins as needed to engage in ADLs/IADLs with increased safety/endurance.   Baseline:  Goal status: IN PROGRESS  6. Pt will demonstrate increased ease with dressing as evidenced by decreasing PPT#4(don/ doff jacket) by 10 secs or more.  Baseline: TBD  Goal status: IN PROGRESS  ASSESSMENT:  CLINICAL IMPRESSION: Pt demonstrating good carryover of education on hand placement and anterior weight shift during sit > stand by standing without any cues or use of RW this session. Pt does demonstrate WB preference through LLE, therefore demonstrating some hesitancy and slippage of RLE during sit > stand due to decreased weight shifting to R. Pt benefiting from massed practice with bed mobility with focus on large amplitude movements when repositioning and advancing legs in to bed.  Pt will continue to benefit from practice in clinic and at home to facilitate increased carryover and independence.  PERFORMANCE DEFICITS: in functional skills including ADLs, IADLs, ROM, strength, pain, flexibility, Fine motor control, Gross motor control, mobility, balance, body mechanics, endurance, decreased knowledge of precautions, decreased knowledge of use of DME, and UE functional use, cognitive skills including attention, memory, safety awareness, sequencing, and thought, and psychosocial skills including environmental adaptation and routines and behaviors.   IMPAIRMENTS: are limiting patient from ADLs and IADLs.   CO-MORBIDITIES: may have co-morbidities  that affects occupational performance. Patient will benefit from skilled OT to address above impairments and improve overall function.  MODIFICATION OR ASSISTANCE TO COMPLETE EVALUATION: Min-Moderate modification of tasks or assist with assess necessary to complete an evaluation.  OT OCCUPATIONAL PROFILE AND HISTORY: Detailed assessment: Review of records and additional review of physical, cognitive, psychosocial history related  to current functional performance.  CLINICAL DECISION MAKING: Moderate - several treatment options, min-mod task modification necessary  REHAB POTENTIAL: Good  EVALUATION COMPLEXITY: Moderate    PLAN:  OT FREQUENCY: 2x/week  OT DURATION: 8 weeks  PLANNED INTERVENTIONS: self care/ADL training, therapeutic exercise, therapeutic activity, neuromuscular re-education, manual therapy, balance training, functional mobility training, ultrasound,  biofeedback, compression bandaging, moist heat, cryotherapy, patient/family education, cognitive remediation/compensation, visual/perceptual remediation/compensation, psychosocial skills training, energy conservation, coping strategies training, and DME and/or AE instructions  RECOMMENDED OTHER SERVICES: N/A  CONSULTED AND AGREED WITH PLAN OF CARE: Patient and family member/caregiver  PLAN FOR NEXT SESSION:  Reiterate DME/AE for bathroom transfers and safety/independence with ADLs/IADLs. Practice shower transfers with stepping over shower ledge and continue to discuss DME/AE for bathroom transfers and dressing tasks.   Simonne Come, OTR/L 07/31/2022, 4:24 PM

## 2022-07-31 NOTE — Therapy (Signed)
OUTPATIENT PHYSICAL THERAPY NEURO TREATMENT   Patient Name: [name] MRN: [id number] DOB:02-May-1936, 86 y.o., male Today's Date: 07/31/2022   PCP: Donnajean Lopes, MD REFERRING PROVIDER: Star Age, MD  END OF SESSION:  PT End of Session - 07/31/22 1409     Visit Number 4    Number of Visits 16    Date for PT Re-Evaluation 09/10/22    Authorization Type Medicare    Progress Note Due on Visit 10    PT Start Time 1400    PT Stop Time 7829    PT Time Calculation (min) 45 min    Equipment Utilized During Treatment Gait belt    Activity Tolerance Patient tolerated treatment well    Behavior During Therapy WFL for tasks assessed/performed              Past Medical History:  Diagnosis Date   Ankylosing spondylitis ([place]) dx'd ~ 1974   Arthritis    "back" (08/10/2015)   BENIGN PROSTATIC HYPERTROPHY, WITH OBSTRUCTION 01/15/2010   Bleeding duodenal ulcer    Bleeding esophageal ulcer    Bleeding stomach ulcer    GERD 02/22/2008   History of blood transfusion 1988   "lost ~ 1/2 of my blood volume from multiple bleeding ulcers"   History of hiatal hernia    HYPERGLYCEMIA 11/18/2007   HYPERLIPIDEMIA 02/22/2008   HYPERTENSION, UNSPECIFIED 11/10/2009   Kidney stones    Osteoarthritis    c-spine   PEPTIC ULCER DISEASE 11/17/2007   Situational depression    "son died in [place] 06/25/15"   TIA (transient ischemic attack)    "not that I know of in the past; they are trying to determine if I've had one today (08/10/2015)"   Past Surgical History:  Procedure Laterality Date   CATARACT EXTRACTION W/ INTRAOCULAR LENS  IMPLANT, BILATERAL Bilateral 2013   Patient Active Problem List   Diagnosis Date Noted   Ankylosing spondylitis of cervicothoracic region ([place]) 10/16/2016   TIA (transient ischemic attack) 08/10/2015   Carotid stenosis 08/04/2014   Hyperlipidemia 02/21/2012   BENIGN PROSTATIC HYPERTROPHY, WITH OBSTRUCTION 01/15/2010   Essential hypertension 11/10/2009   GERD  02/22/2008   NEPHROLITHIASIS, HX OF 11/17/2007    ONSET DATE: 06-24-22, PD for 4 years  REFERRING DIAG: Z91.81 (ICD-10-CM) - History of recent fall G20.C (ICD-10-CM) - Primary parkinsonism  THERAPY DIAG:  Muscle weakness (generalized)  Unsteadiness on feet  Abnormal posture  Other abnormalities of gait and mobility  Difficulty in walking, not elsewhere classified  Rationale for Evaluation and Treatment: Rehabilitation  SUBJECTIVE:  SUBJECTIVE STATEMENT: Had a fall on Friday night, fell backwards. Bruised his back but did not hit his head. Feels a little dizzy when getting out of bed and standing up.     Pt accompanied by: significant other  PERTINENT HISTORY: PD, complex medical history of peptic ulcer disease with history of upper GI bleed, TIA, depression, degenerative disc disease in the neck, osteoarthritis, kidney stones, hyperlipidemia, hypertension, history of hiatal hernia, BPH, arthritis, and ankylosing spondylitis, and parkinsonism  PAIN:  Are you having pain? No, none more than usual  PRECAUTIONS: None  WEIGHT BEARING RESTRICTIONS: No  FALLS: Has patient fallen in last 6 months? Yes. Number of falls "several"  LIVING ENVIRONMENT: Lives with: lives with their spouse Lives in: House/apartment Stairs:  stairs inside home to loft, ramp to enter/exit home Has following equipment at home: Gilford Rile - 2 wheeled, since the fall in October  PLOF: [place]: return to PLOF  OBJECTIVE:   TODAY'S TREATMENT: 07/31/22 Activity Comments  NU-step resistance intervals x 8 min. Cues and assist in right foot position 1:1, descending resist level to improve alternating movements and activity tolerance   Seated PWR! Moves 1x10 -Up, Rock, Twist, Step--hand-over-hand cues  for rock and twist to facilitate coordination  Gait training SBA with RW and verbal cues and visual point for destination and cue to, "make it from here to there in X number of steps" to promote large step length  Strength training Standing high cable row 15# 2x10, need for physical guidance for proper form to improve postural strength and shoulder extension recruitment           TODAY'S TREATMENT: 07/29/22 Activity Comments  Figure 4 ROM in supine For piriformis stretch  DKTC stretch Good ROM and tolerance  Supine PWR! moves -twist 10x -step 2x (experiencing right hamstring cramp -bridge 1x5 to correct form (hamstring cramp)  Supine posture stretch -thoracic extension mobilization  Seated Shoulder Horizontal Abduction with Resistance 2x10 w/ red band, frequent cues for isolation and form          HOME EXERCISE PROGRAM Last updated: 07/24/22 Access Code: [email or web address] URL: [email or web address] Date: 07/24/2022 Prepared by: [facility]  Program Notes perform with gait belt and wife's supervision  Exercises - Standing Balance in Corner  - 1 x daily - 7 x weekly - 3 sets - 30 sec hold - Narrow Stance with Counter Support  - 1 x daily - 5 x weekly - 2 sets - 10 reps - 30 sec hold - Standing Balance in Corner with Eyes Closed  - 1 x daily - 5 x weekly - 2 sets - 10 reps - 30 sec hold - Sit to Stand with Counter Support  - 1 x daily - 5 x weekly - 2 sets - 5-10 reps - Supine Lower Trunk Rotation  - 1 x daily - 7 x weekly - 3 sets - 10 reps - Supine Bridge  - 1 x daily - 7 x weekly - 3 sets - 10 reps - Thoracic Extension Mobilization on Foam Roll  - 1 x daily - 7 x weekly - 3 sets - 10 reps - 3 sec hold - Seated Shoulder Horizontal Abduction with Resistance  - 1 x daily - 7 x weekly - 3 sets - 10 reps    PATIENT EDUCATION: Education details: review/clarification and update to HEP- to be performed with gait belt, wife's supervision,  walker in front, and wall behind for max  safety Person educated: Patient and Spouse Education method: Explanation, Demonstration, Tactile cues, Verbal cues, and Handouts Education comprehension: verbalized understanding and returned demonstration    Below measures were taken at time of initial evaluation unless otherwise specified:   DIAGNOSTIC FINDINGS: no intracranial or osseous injuries  COGNITION: Overall cognitive status: Within functional limits for tasks assessed   SENSATION: WFL  COORDINATION: Alternating movements impaired, right ankle limitations pre-morbid from hx of crush injury in childhood Heel to shin mildly impaired right > left Finger to nose WNL Finger opposition: unable to perform bilaterally  EDEMA:  none  MUSCLE TONE: NT  MUSCLE LENGTH: Presents with bilateral knee flexion contractures, approx 5-10 degrees  DTRs:    POSTURE: rounded shoulders, forward head, and increased thoracic kyphosis  LOWER EXTREMITY ROM:     Active  Right Eval Left Eval  Hip flexion    Hip extension    Hip abduction    Hip adduction    Hip internal rotation    Hip external rotation    Knee flexion    Knee extension -10 -10  Ankle dorsiflexion 5 10  Ankle plantarflexion    Ankle inversion    Ankle eversion     (Blank rows = not tested)  LOWER EXTREMITY MMT:  assessed in sitting  MMT Right Eval Left Eval  Hip flexion 4 4  Hip extension    Hip abduction 3+ 3+  Hip adduction 4- 4-  Hip internal rotation    Hip external rotation    Knee flexion 4 4  Knee extension 4- 4-  Ankle dorsiflexion 2+ 3+  Ankle plantarflexion    Ankle inversion    Ankle eversion    (Blank rows = not tested)  BED MOBILITY:  Mod A  TRANSFERS: Assistive device utilized: Environmental consultant - 2 wheeled  Sit to stand: CGA and Min A Stand to sit: SBA Chair to chair: CGA and Min A Floor:  DNT  RAMP:  NT  CURB:  Level of Assistance: CGA Assistive device utilized: Environmental consultant - 2  wheeled Curb Comments:   STAIRS: NT  GAIT: Gait pattern:  right knee extensor thrust in stance, shuffling, and trunk flexed, deficits during turning Distance walked:  Assistive device utilized: Environmental consultant - 2 wheeled Level of assistance: CGA and Min A Comments: unsteady, deficits during turns  FUNCTIONAL TESTS:  5 times sit to stand: unable to complete, retro-pulsion Timed up and go (TUG): 29 sec Push and release test (# of steps): Anterior: absent  Posterior: absent  M-CTSIB  Condition 1: Firm Surface, EO 30 Sec, Mild Sway  Condition 2: Firm Surface, EC 30 Sec, Mild and Moderate Sway  Condition 3: Foam Surface, EO 0 Sec,  unable  Sway  Condition 4: Foam Surface, EC - Sec,  -  Sway     VESTIBULAR ASSESSMENT   GENERAL OBSERVATION: right eyebrow ptosis?    SYMPTOM BEHAVIOR:   Subjective history: fall and strike to anterior right forehead   Non-Vestibular symptoms: denies   Type of dizziness: Imbalance (Disequilibrium) and Lightheadedness/Faint   Frequency: "depends"   Duration: "whenever"   Aggravating factors: No known aggravating factors   Relieving factors: no known relieving factors   Progression of symptoms: unchanged   OCULOMOTOR EXAM:   Ocular Alignment: right eye ptosis   Ocular ROM: No Limitations   Spontaneous Nystagmus: absent   Gaze-Induced Nystagmus: age appropriate nystagmus at end range   Smooth Pursuits: saccades   Saccades: hypometric/undershoots, extra eye movements, and slow   Convergence/Divergence: 10  inches      VESTIBULAR - OCULAR REFLEX:    Slow VOR: Comment: difficulty in tracking   VOR Cancellation: Comment: NT   Head-Impulse Test: NT   Dynamic Visual Acuity: Not able to be assessed    POSITIONAL TESTING: unable to test due to time limitation    MOTION SENSITIVITY:    Motion Sensitivity Quotient  Intensity: 0 = none, 1 = Lightheaded, 2 = Mild, 3 = Moderate, 4 = Severe, 5 = Vomiting  Intensity  1. Sitting to supine   2. Supine to L  side   3. Supine to R side   4. Supine to sitting   5. L Hallpike-Dix   6. Up from L    7. R Hallpike-Dix   8. Up from R    9. Sitting, head  tipped to L knee   10. Head up from L  knee   11. Sitting, head  tipped to R knee   12. Head up from R  knee   13. Sitting head turns x5   14.Sitting head nods x5   15. In stance, 180  turn to L    16. In stance, 180  turn to R     OTHOSTATICS: not done  FUNCTIONAL GAIT:    VESTIBULAR TREATMENT:  Canalith Repositioning:   TBD Gaze Adaptation:   TBD Habituation:   TBD Other:  PATIENT SURVEYS:    PATIENT EDUCATION: Education details: assessment findings/interpretations Person educated: Patient and Spouse Education method: Explanation Education comprehension: verbalized understanding  HOME EXERCISE PROGRAM: Access Code: [email or web address] URL: [email or web address] Date: 07/16/2022 Prepared by: Sherlyn Lees  Exercises - Standing Balance in [place]  - 1 x daily - 7 x weekly - 3 sets - 10-30 sec hold   GOALS: Goals reviewed with patient? Yes  SHORT TERM GOALS: Target date: 08/13/2022    Patient will perform HEP with family/caregiver supervision for improved strength, balance, transfers, gaze adaptation, habituation, balance, and gait  Baseline: Goal status: IN PROGRESS  2.  Demonstrate functional transfers with supervision level with or without AD to improve safety with functional mobility Baseline: CGA-min A Goal status: IN PROGRESS  3.  Manifest improved BLE strength as evidenced by ability to complete 5xSTS test in 25 sec to improve safety with mobility Baseline: unable Goal status: IN PROGRESS   LONG TERM GOALS: Target date: 09/10/2022    Demonstrate reduce risk for falls per time 15 sec TUG test with least restrictive AD Baseline: 29 sec w/ RW Goal status: IN PROGRESS  2.  Demo ability to transfer and ambulate over various surfaces x 300 ft with modified independence to reduce level of  assistance from caregiver Baseline: CGA-min A w/ RW Goal status: IN PROGRESS  3.  Demonstrate improved reactive balance as evidenced by ability to enact 2-3 steps with push-release test to facilitate righting reactions and reduce risk for falls Baseline: Absent, LOB Goal status: IN PROGRESS   ASSESSMENT:  CLINICAL IMPRESSION: Focus on improving coordination, alternating movements, and large amplitude movements to improve motor planning/coordination to enhance functional mobility and reduce level of assistance and/or risk for falls. Difficulty in sequence and coordination often requiring hand-over-hand cues for proper form and execution.  Continued sessions to advance POC details and provide for additional pt/caregiver training in requisite intervention strategies.  OBJECTIVE IMPAIRMENTS: Abnormal gait, decreased activity tolerance, decreased balance, decreased coordination, decreased knowledge of use of DME, difficulty walking, decreased ROM, decreased strength, dizziness, impaired flexibility, improper body mechanics, and postural dysfunction.  ACTIVITY LIMITATIONS: carrying, lifting, bending, standing, squatting, stairs, transfers, bed mobility, bathing, dressing, reach over head, and locomotion level  PARTICIPATION LIMITATIONS: meal prep, cleaning, laundry, shopping, community activity, and yard work  PERSONAL FACTORS: Age, Time since onset of injury/illness/exacerbation, and 3+ comorbidities: see medical hx  are also affecting patient's functional outcome.   REHAB POTENTIAL: Good  CLINICAL DECISION MAKING: Evolving/moderate complexity  EVALUATION COMPLEXITY: Moderate  PLAN:  PT FREQUENCY: 2x/week  PT DURATION: 8 weeks  PLANNED INTERVENTIONS: Therapeutic exercises, Therapeutic activity, Neuromuscular re-education, Balance training, Gait training, Patient/Family education, Self Care, Joint mobilization, Stair training, Vestibular training, Canalith repositioning, DME  instructions, Aquatic Therapy, Dry Needling, Electrical stimulation, Wheelchair mobility training, Spinal mobilization, Cryotherapy, Moist heat, Taping, and Manual therapy  PLAN FOR NEXT SESSION: supine/seated PWR! Moves, corner balance trials    2:09 PM, 07/31/22 M. Sherlyn Lees, PT, DPT Physical Therapist- [place] Office Number: [phone]   [place] [facility] [address] [place], [place] [address] Phone # [phone] Fax # [phone]

## 2022-08-01 ENCOUNTER — Ambulatory Visit: Payer: Medicare Other | Admitting: Neurology

## 2022-08-05 ENCOUNTER — Ambulatory Visit: Payer: Medicare Other | Admitting: Occupational Therapy

## 2022-08-05 ENCOUNTER — Ambulatory Visit: Payer: Medicare Other

## 2022-08-05 DIAGNOSIS — R2681 Unsteadiness on feet: Secondary | ICD-10-CM | POA: Diagnosis not present

## 2022-08-05 DIAGNOSIS — R2689 Other abnormalities of gait and mobility: Secondary | ICD-10-CM

## 2022-08-05 DIAGNOSIS — M6281 Muscle weakness (generalized): Secondary | ICD-10-CM

## 2022-08-05 DIAGNOSIS — R262 Difficulty in walking, not elsewhere classified: Secondary | ICD-10-CM

## 2022-08-05 DIAGNOSIS — R29818 Other symptoms and signs involving the nervous system: Secondary | ICD-10-CM | POA: Diagnosis not present

## 2022-08-05 DIAGNOSIS — R293 Abnormal posture: Secondary | ICD-10-CM

## 2022-08-05 DIAGNOSIS — R41840 Attention and concentration deficit: Secondary | ICD-10-CM

## 2022-08-05 NOTE — Therapy (Signed)
OUTPATIENT OCCUPATIONAL THERAPY Treatment Session  Patient Name: [name] MRN: [id number] DOB:May 09, 1936, 86 y.o., male Today's Date: 08/05/2022  PCP: Donnajean Lopes, MD REFERRING PROVIDER: Star Age, MD  END OF SESSION:  OT End of Session - 08/05/22 1419     Visit Number 5    Number of Visits 17    Date for OT Re-Evaluation 09/13/22    Authorization Type Medicare A &B    OT Start Time 1406    OT Stop Time 3016    OT Time Calculation (min) 42 min                 Past Medical History:  Diagnosis Date   Ankylosing spondylitis ([place]) dx'd ~ 1974   Arthritis    "back" (08/10/2015)   BENIGN PROSTATIC HYPERTROPHY, WITH OBSTRUCTION 01/15/2010   Bleeding duodenal ulcer    Bleeding esophageal ulcer    Bleeding stomach ulcer    GERD 02/22/2008   History of blood transfusion 1988   "lost ~ 1/2 of my blood volume from multiple bleeding ulcers"   History of hiatal hernia    HYPERGLYCEMIA 11/18/2007   HYPERLIPIDEMIA 02/22/2008   HYPERTENSION, UNSPECIFIED 11/10/2009   Kidney stones    Osteoarthritis    c-spine   PEPTIC ULCER DISEASE 11/17/2007   Situational depression    "son died in [place] 2015-06-20"   TIA (transient ischemic attack)    "not that I know of in the past; they are trying to determine if I've had one today (08/10/2015)"   Past Surgical History:  Procedure Laterality Date   CATARACT EXTRACTION W/ INTRAOCULAR LENS  IMPLANT, BILATERAL Bilateral 2013   Patient Active Problem List   Diagnosis Date Noted   Ankylosing spondylitis of cervicothoracic region ([place]) 10/16/2016   TIA (transient ischemic attack) 08/10/2015   Carotid stenosis 08/04/2014   Hyperlipidemia 02/21/2012   BENIGN PROSTATIC HYPERTROPHY, WITH OBSTRUCTION 01/15/2010   Essential hypertension 11/10/2009   GERD 02/22/2008   NEPHROLITHIASIS, HX OF 11/17/2007    ONSET DATE: referral date 07/04/22   REFERRING DIAG: G20.C (ICD-10-CM) - Parkinsonism, unspecified Z91.81 (ICD-10-CM) - History of  falling  THERAPY DIAG:  Muscle weakness (generalized)  Abnormal posture  Unsteadiness on feet  Other abnormalities of gait and mobility  Other symptoms and signs involving the nervous system  Attention and concentration deficit  Rationale for Evaluation and Treatment: Rehabilitation  SUBJECTIVE:   SUBJECTIVE STATEMENT: Pt reports noticing some improvements with getting in/out of bed.  Pt accompanied by: self  PERTINENT HISTORY: history of Parkinson's and ankylosing spondylitis   PRECAUTIONS: Fall  WEIGHT BEARING RESTRICTIONS: No  PAIN:  Are you having pain? No  FALLS: Has patient fallen in last 6 months? Yes. Number of falls 6 (3-4 since "big fall" in Jun 20, 2023)  LIVING ENVIRONMENT: Lives with: lives with their spouse Lives in: House/apartment Stairs: Yes: External: 2-3 steps; has had a ramp built just in last week Internal: steps to basement and steps to loft - however pt does not need to go up/down those at this time. Has following equipment at home: Gil[employer] Rile - 2 wheeled, bed side commode, Ramped entry, and transport chair  PLOF: Independent  PATIENT GOALS: to be able to feel confident in any sort of movement to resume prior activities  OBJECTIVE:   HAND DOMINANCE: Right  ADLs: Overall ADLs: Prior to fall in 2022-06-19, pt Independent with all ADLs and IADLs without use of AE/DME, even completing shower in standing in walk-in shower. Transfers/ambulation related to ADLs:  fluctuates with mobility; now using RW for all mobility, Supervision with all mobility Eating: Mod I Grooming: wife will assist with opening items; otherwise setup and supervision due to balance UB Dressing: Min-mod assist, buttons/zippers are quite challenging LB Dressing: Mod assist, is able to don socks/shoes with increased time Toileting: Max assist Bathing: sponge baths since fall, max assist from wife at this time Tub Shower transfers: has walk-in shower, but does not feel safe in shower  since fall in Oct Equipment: bed side commode  IADLs: Meal Prep: since fall in October 2023, pt has been assisting with 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  Pt currently cutting foods for meal prep in sitting position. Medication management: wife assisting Handwriting:  TBA  MOBILITY STATUS: Needs Assist: CGA with mobility with RW and Hx of falls  POSTURE COMMENTS:  rounded shoulders and forward head  FUNCTIONAL OUTCOME MEASURES: Physical performance test: PPT #4 donning/doffing jacket: unable to complete due to LOB* 5 time sit > stand:  1:05.94 with up to mod assist and cues to scoot forward towards edge of chair and needing to push up from RW.  UPPER EXTREMITY ROM:  all movements are slowed, decreased shoulder flexion d/t ankylosing spondylitis  Active ROM Right eval Left eval  Shoulder flexion 90 90  Shoulder abduction    Shoulder adduction    Shoulder extension    Shoulder internal rotation [facility]  Shoulder external rotation [facility]  Elbow flexion [facility] WFL  Elbow extension [facility] WFL  Wrist flexion    Wrist extension    Wrist ulnar deviation    Wrist radial deviation    Wrist pronation    Wrist supination    (Blank rows = not tested)  UPPER EXTREMITY MMT:     MMT Right eval Left eval  Shoulder flexion 4/5 4/5  Shoulder abduction    Shoulder adduction    Shoulder extension    Shoulder internal rotation    Shoulder external rotation    Middle trapezius    Lower trapezius    Elbow flexion 4/5 4/5  Elbow extension 4/5 4/5  Wrist flexion    Wrist extension    Wrist ulnar deviation    Wrist radial deviation    Wrist pronation    Wrist supination    (Blank rows = not tested)  HAND FUNCTION: TBD  COORDINATION: Slowed movements, TBA at future session  COGNITION: Overall cognitive status:  Pt's spouse reports that his thinking seems slower, but attributes it to stress from all issues presenting s/p fall in October  VISION: Subjective report: glasses broke  with recent fall Baseline vision: Wears glasses all the time Visual history: cataracts  VISION ASSESSMENT: Not tested  Patient has difficulty with following activities due to following visual impairments: currently difficult to assess as pt does not have glasses with him, as they broke during fall in October 2023.  OBSERVATIONS: Pt with slowed response time and movements during transfers to/from treatment space and during assessment.   TODAY'S TREATMENT:    08/05/22 5x sit > stand: pt attempted sit > stand x5 with pt able to come up to full standing with UE on thighs and/or on chair demonstrating improvements in sit > stand from initial treatment sessions when pt pulling up on RW.  OT providing CGA for sit > stand vs up to mod assist initially.  Pt is demonstrating improved technique and improvements with sit > stand, however still requiring ~1 min due to instability and inability to complete  5 in a row.   Dynamic standing: completed table top task with pt standing 3-4 mins without UE support or assistance.  Pt demonstrating mild sway in all directions but no LOB.   Box and Blocks: R: 37 blocks and L: 38 blocks Pipe tree: completed additional standing task while completing pipe tree puzzle to increase standing tolerance as needed to increase engagement in IADLs.  Pt requiring max multimodal cues for correct sequencing with pipe tree puzzle replication.  Pt tolerated standing 8 mins without LOB or need for rest break. Energy conservation: OT educating on energy conservation strategies for ADLs and IADLs providing specifics for bathing, dressing, meal prep.   Pt able to recognize areas in which he could improve.  OT provided with handout from OT toolkit to take home to discuss further with wife.   07/31/22 Engaged in bed mobility with focus on large amplitude movements with swinging legs in to bed, bridging and scooting hips prior to rolling over, positioning prior to transitioning from sit >  supine in bed.  OT providing demonstration and mod verbal and tactile cues for sequencing.  Pt requiring tactile cues at legs for large amplitude stepping/lifting pattern when bringing legs in to bed and when positioning in bed prior to rolling on to side.  Engaged in bridging and scooting hips R and L prior to rolling to facilitate increased alignment prior to rolling.  Pt demonstrating improved sequencing of sit <> sidelying as well as scooting over in bed with min cues and repetition. AE: discussed 3 in 1 vs shower chair with arm rests for walk-in shower at home.  Pt would benefit form shower hair with arm rests as neither pt nor spouse need to be moving 3 in 1 in/out of shower due to fall risk.  OT demonstrated and educated on side stepping method for shower transfers, will benefit from practice with stepping over ~5 inch ledge.   07/29/22 Transitional movements: Pt completing all sit <> stand without pulling up on RW this session, with significant improvements from prior session.  OT educating pt on RW placement with transfers as he has tendency to push it too far forward and away during transfer to chair.  OT educated on fall risk and recommending pt maintain RW in front of self as he turns to sit in chair/toilet, etc.  Pt demonstrating with mod cues to ensure proper technique. LB dressing: OT educated on figure 4 positioning as well as use of reacher for LB dressing.  Pt completed simulated LB dressing with gait belt and then trash bag to practice sequencing.  Pt continues to benefit from mod cues for sequencing and technique, however with repetition pt should demonstrate increased technique and ease.  Pt reports tightness in hamstrings in figure 4 position, but able to complete with min effort.  OT educated on long handled sponge/loofah for bathing, recommending for increased independence and ease with washing legs and back.  Pt demonstrated understanding.   07/24/22 5 time sit > stand: 1:05.94 with  up to mod assist and cues to scoot forward towards edge of chair and needing to push up from RW.  OT provided cues and instruction for hand placement and anterior weight shift during sit > stand to decrease tendency to push up on RW.  Pt completed with massed practice and decreasing assist to close supervision without arm rests or pushing up from RW. Jacket: Attempted to complete #4 of PPT however pt requiring > 26 seconds before OT stopping timer.  Pt able to thread single UE before losing balance while attempting to thread LUE.  OT providing mod assist to lower pt to chair. Reviewed AE/DME: discussed with pt and wife recommendations for grab bars in bathroom.  Discussed installed grab bars vs suction cup grab bars for increased safety.  Pt's spouse reports bathroom shower may need to be remodeled to allow for installed grab bars.  Discussed shower chair with/without arm rests vs BSC vs tub bench for seat in walk-in shower.  OT provided pt and spouse with pictures of options and encouraged them to look at options and take measurements of shower to allow for appropriate equipment and practice during future sessions.                                          PATIENT EDUCATION: Education details: Educated on energy conservation strategies Person educated: Patient and Spouse Education method: Explanation, Media planner, and Verbal cues Education comprehension: verbalized understanding and needs further education  HOME EXERCISE PROGRAM: TBD   GOALS: Goals reviewed with patient? Yes  SHORT TERM GOALS: Target date: 08/16/22  Pt and wife will report understanding of adaptive techniques and/or potential DME/AE needs to increase ease, safety, and independence w/ ADLs. Baseline: Goal status: IN PROGRESS  2.  Pt will perform dynamic standing task for 5 mins as needed for simple IADLs w/o LOB using DME and/or countertop support prn  Baseline: decreased standing tolerance Goal status: IN PROGRESS  3.   Pt will demonstrate improved ease with sit > stand to get up from toilet or other seating options in home by improving score on sit > stand to decreased fall risk. Baseline: 1:05.94 Goal status: IN PROGRESS  4.  Pt will complete UB dressing (except clothing fasteners) with supervision. Baseline: wife assists with buttons occasionally prior to fall Goal status: IN PROGRESS   LONG TERM GOALS: Target date: 09/13/22  Pt will demonstrate shower transfers with DME PRN at supervision level. Baseline: currently sponge bathing due to fearfulness of showering, but wants to return to showers.   Goal status: IN PROGRESS  2.  Pt will report increased participation in IADLs (such as cooking) to increase independence with functional IADLs.  Baseline:  Goal status: IN PROGRESS  3.  Pt will complete UB dressing, to include clothing fasteners, at Mod I level with use of AE and/or alternative strategies PRN. Baseline:  Goal status: INITIAL  4.  Pt will complete LB dressing with Supervision/setup with use of AE and/or alternative strategies PRN. Baseline:  Goal status: IN PROGRESS  5.  Pt will demonstrate improved dynamic standing balance for 15 mins as needed to engage in ADLs/IADLs with increased safety/endurance.   Baseline:  Goal status: IN PROGRESS  6. Pt will demonstrate increased ease with dressing as evidenced by decreasing PPT#4(don/ doff jacket) by 10 secs or more.  Baseline: TBD  Goal status: IN PROGRESS  ASSESSMENT:  CLINICAL IMPRESSION: Pt demonstrating good carryover of education on hand placement and anterior weight shift during sit > stand by standing without any cues or use of RW this session. Pt demonstrating improved dynamic standing tolerance while completing table top task.  Pt does require increased multimodal cues for multi-step sequencing with pipe tree task.  PERFORMANCE DEFICITS: in functional skills including ADLs, IADLs, ROM, strength, pain, flexibility, Fine motor  control, Gross motor control, mobility, balance, body mechanics, endurance, decreased knowledge of precautions,  decreased knowledge of use of DME, and UE functional use, cognitive skills including attention, memory, safety awareness, sequencing, and thought, and psychosocial skills including environmental adaptation and routines and behaviors.   IMPAIRMENTS: are limiting patient from ADLs and IADLs.   CO-MORBIDITIES: may have co-morbidities  that affects occupational performance. Patient will benefit from skilled OT to address above impairments and improve overall function.  MODIFICATION OR ASSISTANCE TO COMPLETE EVALUATION: Min-Moderate modification of tasks or assist with assess necessary to complete an evaluation.  OT OCCUPATIONAL PROFILE AND HISTORY: Detailed assessment: Review of records and additional review of physical, cognitive, psychosocial history related to current functional performance.  CLINICAL DECISION MAKING: Moderate - several treatment options, min-mod task modification necessary  REHAB POTENTIAL: Good  EVALUATION COMPLEXITY: Moderate    PLAN:  OT FREQUENCY: 2x/week  OT DURATION: 8 weeks  PLANNED INTERVENTIONS: self care/ADL training, therapeutic exercise, therapeutic activity, neuromuscular re-education, manual therapy, balance training, functional mobility training, ultrasound, biofeedback, compression bandaging, moist heat, cryotherapy, patient/family education, cognitive remediation/compensation, visual/perceptual remediation/compensation, psychosocial skills training, energy conservation, coping strategies training, and DME and/or AE instructions  RECOMMENDED OTHER SERVICES: N/A  CONSULTED AND AGREED WITH PLAN OF CARE: Patient and family member/caregiver  PLAN FOR NEXT SESSION:  Reiterate DME/AE for bathroom transfers and safety/independence with ADLs/IADLs. Practice shower transfers with stepping over shower ledge and continue to discuss DME/AE for bathroom  transfers and dressing tasks.  Dynamic standing balance/endurance as well as energy conservation strategies.   Simonne Come, OTR/L 08/05/2022, 4:35 PM

## 2022-08-05 NOTE — Therapy (Signed)
OUTPATIENT PHYSICAL THERAPY NEURO TREATMENT   Patient Name: [name] MRN: [id number] DOB:11/14/1935, 86 y.o., male Today's Date: 08/05/2022   PCP: Donnajean Lopes, MD REFERRING PROVIDER: Star Age, MD  END OF SESSION:  PT End of Session - 08/05/22 1450     Visit Number 5    Number of Visits 16    Date for PT Re-Evaluation 09/10/22    Authorization Type Medicare    Progress Note Due on Visit 10    PT Start Time 1445    PT Stop Time 5361    PT Time Calculation (min) 45 min    Equipment Utilized During Treatment Gait belt    Activity Tolerance Patient tolerated treatment well    Behavior During Therapy WFL for tasks assessed/performed              Past Medical History:  Diagnosis Date   Ankylosing spondylitis ([place]) dx'd ~ 1974   Arthritis    "back" (08/10/2015)   BENIGN PROSTATIC HYPERTROPHY, WITH OBSTRUCTION 01/15/2010   Bleeding duodenal ulcer    Bleeding esophageal ulcer    Bleeding stomach ulcer    GERD 02/22/2008   History of blood transfusion 1988   "lost ~ 1/2 of my blood volume from multiple bleeding ulcers"   History of hiatal hernia    HYPERGLYCEMIA 11/18/2007   HYPERLIPIDEMIA 02/22/2008   HYPERTENSION, UNSPECIFIED 11/10/2009   Kidney stones    Osteoarthritis    c-spine   PEPTIC ULCER DISEASE 11/17/2007   Situational depression    "son died in [place] 06/25/2015"   TIA (transient ischemic attack)    "not that I know of in the past; they are trying to determine if I've had one today (08/10/2015)"   Past Surgical History:  Procedure Laterality Date   CATARACT EXTRACTION W/ INTRAOCULAR LENS  IMPLANT, BILATERAL Bilateral 2013   Patient Active Problem List   Diagnosis Date Noted   Ankylosing spondylitis of cervicothoracic region ([place]) 10/16/2016   TIA (transient ischemic attack) 08/10/2015   Carotid stenosis 08/04/2014   Hyperlipidemia 02/21/2012   BENIGN PROSTATIC HYPERTROPHY, WITH OBSTRUCTION 01/15/2010   Essential hypertension 11/10/2009   GERD  02/22/2008   NEPHROLITHIASIS, HX OF 11/17/2007    ONSET DATE: June 24, 2022, PD for 4 years  REFERRING DIAG: Z91.81 (ICD-10-CM) - History of recent fall G20.C (ICD-10-CM) - Primary parkinsonism  THERAPY DIAG:  Muscle weakness (generalized)  Abnormal posture  Unsteadiness on feet  Other abnormalities of gait and mobility  Difficulty in walking, not elsewhere classified  Rationale for Evaluation and Treatment: Rehabilitation  SUBJECTIVE:  SUBJECTIVE STATEMENT: "No falls per say, but did have some episodes of bouncing off the walls"    Pt accompanied by: significant other  PERTINENT HISTORY: PD, complex medical history of peptic ulcer disease with history of upper GI bleed, TIA, depression, degenerative disc disease in the neck, osteoarthritis, kidney stones, hyperlipidemia, hypertension, history of hiatal hernia, BPH, arthritis, and ankylosing spondylitis, and parkinsonism  PAIN:  Are you having pain? No, none more than usual  PRECAUTIONS: None  WEIGHT BEARING RESTRICTIONS: No  FALLS: Has patient fallen in last 6 months? Yes. Number of falls "several"  LIVING ENVIRONMENT: Lives with: lives with their spouse Lives in: House/apartment Stairs:  stairs inside home to loft, ramp to enter/exit home Has following equipment at home: Gilford Rile - 2 wheeled, since the fall in October  PLOF: [place]: return to PLOF  OBJECTIVE:   TODAY'S TREATMENT: 08/05/22 Activity Comments  Seated PWR! moves Up, rock, twist--difficulty with leaving midline with twist  Cable rows High pull 2x10 15# Low pull 2x10 15# To improve postural strength and improve pulling power  LAQ 3x10 5#  Seated hip flexion 2x10 5#  Standing with weight stack support with chained belt at 50# -weight shift  post-ant 10x -standing hip flexion 2x10 5# ankle weights -postural re-education standing tall 3x30 sec eyes closed  Gait  training Verbal/visual cues for improving step length     TODAY'S TREATMENT: 07/31/22 Activity Comments  NU-step resistance intervals x 8 min. Cues and assist in right foot position 1:1, descending resist level to improve alternating movements and activity tolerance   Seated PWR! Moves 1x10 -Up, Rock, Twist, Step--hand-over-hand cues for rock and twist to facilitate coordination  Gait training SBA with RW and verbal cues and visual point for destination and cue to, "make it from here to there in X number of steps" to promote large step length  Strength training Standing high cable row 15# 2x10, need for physical guidance for proper form to improve postural strength and shoulder extension recruitment           TODAY'S TREATMENT: 07/29/22 Activity Comments  Figure 4 ROM in supine For piriformis stretch  DKTC stretch Good ROM and tolerance  Supine PWR! moves -twist 10x -step 2x (experiencing right hamstring cramp -bridge 1x5 to correct form (hamstring cramp)  Supine posture stretch -thoracic extension mobilization  Seated Shoulder Horizontal Abduction with Resistance 2x10 w/ red band, frequent cues for isolation and form          HOME EXERCISE PROGRAM Last updated: 07/24/22 Access Code: [email or web address] URL: [email or web address] Date: 07/24/2022 Prepared by: [facility]  Program Notes perform with gait belt and wife's supervision  Exercises - Standing Balance in Corner  - 1 x daily - 7 x weekly - 3 sets - 30 sec hold - Narrow Stance with Counter Support  - 1 x daily - 5 x weekly - 2 sets - 10 reps - 30 sec hold - Standing Balance in Corner with Eyes Closed  - 1 x daily - 5 x weekly - 2 sets - 10 reps - 30 sec hold - Sit to Stand with Counter Support  - 1 x daily - 5 x weekly - 2 sets - 5-10 reps - Supine Lower  Trunk Rotation  - 1 x daily - 7 x weekly - 3 sets - 10 reps - Supine Bridge  - 1 x daily - 7 x weekly - 3 sets - 10 reps - Thoracic  Extension Mobilization on Foam Roll  - 1 x daily - 7 x weekly - 3 sets - 10 reps - 3 sec hold - Seated Shoulder Horizontal Abduction with Resistance  - 1 x daily - 7 x weekly - 3 sets - 10 reps    PATIENT EDUCATION: Education details: review/clarification and update to HEP- to be performed with gait belt, wife's supervision, walker in front, and wall behind for max safety Person educated: Patient and Spouse Education method: Explanation, Demonstration, Tactile cues, Verbal cues, and Handouts Education comprehension: verbalized understanding and returned demonstration    Below measures were taken at time of initial evaluation unless otherwise specified:   DIAGNOSTIC FINDINGS: no intracranial or osseous injuries  COGNITION: Overall cognitive status: Within functional limits for tasks assessed   SENSATION: WFL  COORDINATION: Alternating movements impaired, right ankle limitations pre-morbid from hx of crush injury in childhood Heel to shin mildly impaired right > left Finger to nose WNL Finger opposition: unable to perform bilaterally  EDEMA:  none  MUSCLE TONE: NT  MUSCLE LENGTH: Presents with bilateral knee flexion contractures, approx 5-10 degrees  DTRs:    POSTURE: rounded shoulders, forward head, and increased thoracic kyphosis  LOWER EXTREMITY ROM:     Active  Right Eval Left Eval  Hip flexion    Hip extension    Hip abduction    Hip adduction    Hip internal rotation    Hip external rotation    Knee flexion    Knee extension -10 -10  Ankle dorsiflexion 5 10  Ankle plantarflexion    Ankle inversion    Ankle eversion     (Blank rows = not tested)  LOWER EXTREMITY MMT:  assessed in sitting  MMT Right Eval Left Eval  Hip flexion 4 4  Hip extension    Hip abduction 3+ 3+  Hip adduction 4- 4-  Hip internal rotation     Hip external rotation    Knee flexion 4 4  Knee extension 4- 4-  Ankle dorsiflexion 2+ 3+  Ankle plantarflexion    Ankle inversion    Ankle eversion    (Blank rows = not tested)  BED MOBILITY:  Mod A  TRANSFERS: Assistive device utilized: Environmental consultant - 2 wheeled  Sit to stand: CGA and Min A Stand to sit: SBA Chair to chair: CGA and Min A Floor:  DNT  RAMP:  NT  CURB:  Level of Assistance: CGA Assistive device utilized: Environmental consultant - 2 wheeled Curb Comments:   STAIRS: NT  GAIT: Gait pattern:  right knee extensor thrust in stance, shuffling, and trunk flexed, deficits during turning Distance walked:  Assistive device utilized: Environmental consultant - 2 wheeled Level of assistance: CGA and Min A Comments: unsteady, deficits during turns  FUNCTIONAL TESTS:  5 times sit to stand: unable to complete, retro-pulsion Timed up and go (TUG): 29 sec Push and release test (# of steps): Anterior: absent  Posterior: absent  M-CTSIB  Condition 1: Firm Surface, EO 30 Sec, Mild Sway  Condition 2: Firm Surface, EC 30 Sec, Mild and Moderate Sway  Condition 3: Foam Surface, EO 0 Sec,  unable  Sway  Condition 4: Foam Surface, EC - Sec,  -  Sway     VESTIBULAR ASSESSMENT   GENERAL OBSERVATION: right eyebrow ptosis?    SYMPTOM BEHAVIOR:   Subjective history: fall and strike to anterior right forehead   Non-Vestibular symptoms: denies   Type of dizziness: Imbalance (Disequilibrium) and Lightheadedness/Faint   Frequency: "depends"  Duration: "whenever"   Aggravating factors: No known aggravating factors   Relieving factors: no known relieving factors   Progression of symptoms: unchanged   OCULOMOTOR EXAM:   Ocular Alignment: right eye ptosis   Ocular ROM: No Limitations   Spontaneous Nystagmus: absent   Gaze-Induced Nystagmus: age appropriate nystagmus at end range   Smooth Pursuits: saccades   Saccades: hypometric/undershoots, extra eye movements, and slow   Convergence/Divergence: 10 inches       VESTIBULAR - OCULAR REFLEX:    Slow VOR: Comment: difficulty in tracking   VOR Cancellation: Comment: NT   Head-Impulse Test: NT   Dynamic Visual Acuity: Not able to be assessed    POSITIONAL TESTING: unable to test due to time limitation    MOTION SENSITIVITY:    Motion Sensitivity Quotient  Intensity: 0 = none, 1 = Lightheaded, 2 = Mild, 3 = Moderate, 4 = Severe, 5 = Vomiting  Intensity  1. Sitting to supine   2. Supine to L side   3. Supine to R side   4. Supine to sitting   5. L Hallpike-Dix   6. Up from L    7. R Hallpike-Dix   8. Up from R    9. Sitting, head  tipped to L knee   10. Head up from L  knee   11. Sitting, head  tipped to R knee   12. Head up from R  knee   13. Sitting head turns x5   14.Sitting head nods x5   15. In stance, 180  turn to L    16. In stance, 180  turn to R     OTHOSTATICS: not done  FUNCTIONAL GAIT:    VESTIBULAR TREATMENT:  Canalith Repositioning:   TBD Gaze Adaptation:   TBD Habituation:   TBD Other:  PATIENT SURVEYS:    PATIENT EDUCATION: Education details: assessment findings/interpretations Person educated: Patient and Spouse Education method: Explanation Education comprehension: verbalized understanding  HOME EXERCISE PROGRAM: Access Code: [email or web address] URL: [email or web address] Date: 07/16/2022 Prepared by: Sherlyn Lees  Exercises - Standing Balance in [place]  - 1 x daily - 7 x weekly - 3 sets - 10-30 sec hold   GOALS: Goals reviewed with patient? Yes  SHORT TERM GOALS: Target date: 08/13/2022    Patient will perform HEP with family/caregiver supervision for improved strength, balance, transfers, gaze adaptation, habituation, balance, and gait  Baseline: Goal status: IN PROGRESS  2.  Demonstrate functional transfers with supervision level with or without AD to improve safety with functional mobility Baseline: CGA-min A Goal status: IN PROGRESS  3.  Manifest improved BLE  strength as evidenced by ability to complete 5xSTS test in 25 sec to improve safety with mobility Baseline: unable Goal status: IN PROGRESS   LONG TERM GOALS: Target date: 09/10/2022    Demonstrate reduce risk for falls per time 15 sec TUG test with least restrictive AD Baseline: 29 sec w/ RW Goal status: IN PROGRESS  2.  Demo ability to transfer and ambulate over various surfaces x 300 ft with modified independence to reduce level of assistance from caregiver Baseline: CGA-min A w/ RW Goal status: IN PROGRESS  3.  Demonstrate improved reactive balance as evidenced by ability to enact 2-3 steps with push-release test to facilitate righting reactions and reduce risk for falls Baseline: Absent, LOB Goal status: IN PROGRESS   ASSESSMENT:  CLINICAL IMPRESSION: Continued with focus for large amplitude movements and initiated strength training with large ROM to improve strength and motor  control.  Multi-modal cues for desired movement and external support to promote trunk extension and confidence to enable this. Increased cues for facilitating LLE strength in single limb support. Difficulty with gait today demonstrating bradykinetic pattern and very short, shuffled steps with limited success for counting steps. Difficulty with coordination in PWR moves and difficulty leaving from midline with twisting movements. Continued sessions to progress with balance and motor control/coordination  OBJECTIVE IMPAIRMENTS: Abnormal gait, decreased activity tolerance, decreased balance, decreased coordination, decreased knowledge of use of DME, difficulty walking, decreased ROM, decreased strength, dizziness, impaired flexibility, improper body mechanics, and postural dysfunction.   ACTIVITY LIMITATIONS: carrying, lifting, bending, standing, squatting, stairs, transfers, bed mobility, bathing, dressing, reach over head, and locomotion level  PARTICIPATION LIMITATIONS: meal prep, cleaning, laundry, shopping,  community activity, and yard work  PERSONAL FACTORS: Age, Time since onset of injury/illness/exacerbation, and 3+ comorbidities: see medical hx  are also affecting patient's functional outcome.   REHAB POTENTIAL: Good  CLINICAL DECISION MAKING: Evolving/moderate complexity  EVALUATION COMPLEXITY: Moderate  PLAN:  PT FREQUENCY: 2x/week  PT DURATION: 8 weeks  PLANNED INTERVENTIONS: Therapeutic exercises, Therapeutic activity, Neuromuscular re-education, Balance training, Gait training, Patient/Family education, Self Care, Joint mobilization, Stair training, Vestibular training, Canalith repositioning, DME instructions, Aquatic Therapy, Dry Needling, Electrical stimulation, Wheelchair mobility training, Spinal mobilization, Cryotherapy, Moist heat, Taping, and Manual therapy  PLAN FOR NEXT SESSION: supine/seated PWR! Moves, corner balance trials    2:50 PM, 08/05/22 M. Sherlyn Lees, PT, DPT Physical Therapist- [place] Office Number: [phone]   [facility] [address] [place], [place] [address] Phone # [phone] Fax # [phone]

## 2022-08-07 ENCOUNTER — Ambulatory Visit: Payer: Medicare Other | Admitting: Occupational Therapy

## 2022-08-07 ENCOUNTER — Ambulatory Visit: Payer: Medicare Other

## 2022-08-07 DIAGNOSIS — M6281 Muscle weakness (generalized): Secondary | ICD-10-CM | POA: Diagnosis not present

## 2022-08-07 DIAGNOSIS — R2681 Unsteadiness on feet: Secondary | ICD-10-CM

## 2022-08-07 DIAGNOSIS — R262 Difficulty in walking, not elsewhere classified: Secondary | ICD-10-CM

## 2022-08-07 DIAGNOSIS — R2689 Other abnormalities of gait and mobility: Secondary | ICD-10-CM

## 2022-08-07 DIAGNOSIS — R293 Abnormal posture: Secondary | ICD-10-CM

## 2022-08-07 DIAGNOSIS — R29818 Other symptoms and signs involving the nervous system: Secondary | ICD-10-CM

## 2022-08-07 NOTE — Therapy (Signed)
OUTPATIENT OCCUPATIONAL THERAPY Treatment Session  Patient Name: [name] MRN: [id number] DOB:02/16/1936, 86 y.o., male Today's Date: 08/07/2022  PCP: Donnajean Lopes, MD REFERRING PROVIDER: Star Age, MD  END OF SESSION:  OT End of Session - 08/07/22 1626     Visit Number 6    Number of Visits 17    Date for OT Re-Evaluation 09/13/22    Authorization Type Medicare A &B    OT Start Time 2706    OT Stop Time 1448    OT Time Calculation (min) 43 min                  Past Medical History:  Diagnosis Date   Ankylosing spondylitis ([place]) dx'd ~ 1974   Arthritis    "back" (08/10/2015)   BENIGN PROSTATIC HYPERTROPHY, WITH OBSTRUCTION 01/15/2010   Bleeding duodenal ulcer    Bleeding esophageal ulcer    Bleeding stomach ulcer    GERD 02/22/2008   History of blood transfusion 1988   "lost ~ 1/2 of my blood volume from multiple bleeding ulcers"   History of hiatal hernia    HYPERGLYCEMIA 11/18/2007   HYPERLIPIDEMIA 02/22/2008   HYPERTENSION, UNSPECIFIED 11/10/2009   Kidney stones    Osteoarthritis    c-spine   PEPTIC ULCER DISEASE 11/17/2007   Situational depression    "son died in [place] 13-Jun-2015"   TIA (transient ischemic attack)    "not that I know of in the past; they are trying to determine if I've had one today (08/10/2015)"   Past Surgical History:  Procedure Laterality Date   CATARACT EXTRACTION W/ INTRAOCULAR LENS  IMPLANT, BILATERAL Bilateral 2013   Patient Active Problem List   Diagnosis Date Noted   Ankylosing spondylitis of cervicothoracic region ([place]) 10/16/2016   TIA (transient ischemic attack) 08/10/2015   Carotid stenosis 08/04/2014   Hyperlipidemia 02/21/2012   BENIGN PROSTATIC HYPERTROPHY, WITH OBSTRUCTION 01/15/2010   Essential hypertension 11/10/2009   GERD 02/22/2008   NEPHROLITHIASIS, HX OF 11/17/2007    ONSET DATE: referral date 07/04/22   REFERRING DIAG: G20.C (ICD-10-CM) - Parkinsonism, unspecified Z91.81 (ICD-10-CM) - History  of falling  THERAPY DIAG:  Muscle weakness (generalized)  Other symptoms and signs involving the nervous system  Abnormal posture  Unsteadiness on feet  Rationale for Evaluation and Treatment: Rehabilitation  SUBJECTIVE:   SUBJECTIVE STATEMENT: Pt reports that he "tumbled" and fell to the floor when he was trying to get up from recliner to go to bed.    Pt accompanied by: self  PERTINENT HISTORY: history of Parkinson's and ankylosing spondylitis   PRECAUTIONS: Fall  WEIGHT BEARING RESTRICTIONS: No  PAIN:  Are you having pain? No  FALLS: Has patient fallen in last 6 months? Yes. Number of falls 6 (3-4 since "big fall" in 06-13-23)  LIVING ENVIRONMENT: Lives with: lives with their spouse Lives in: House/apartment Stairs: Yes: External: 2-3 steps; has had a ramp built just in last week Internal: steps to basement and steps to loft - however pt does not need to go up/down those at this time. Has following equipment at home: Gilford Rile - 2 wheeled, bed side commode, Ramped entry, and transport chair  PLOF: Independent  PATIENT GOALS: to be able to feel confident in any sort of movement to resume prior activities  OBJECTIVE:   HAND DOMINANCE: Right  ADLs: Overall ADLs: Prior to fall in 2022-06-12, pt Independent with all ADLs and IADLs without use of AE/DME, even completing shower in standing in walk-in shower.  Transfers/ambulation related to ADLs: fluctuates with mobility; now using RW for all mobility, Supervision with all mobility Eating: Mod I Grooming: wife will assist with opening items; otherwise setup and supervision due to balance UB Dressing: Min-mod assist, buttons/zippers are quite challenging LB Dressing: Mod assist, is able to don socks/shoes with increased time Toileting: Max assist Bathing: sponge baths since fall, max assist from wife at this time Tub Shower transfers: has walk-in shower, but does not feel safe in shower since fall in Oct Equipment: bed side  commode  IADLs: Meal Prep: since fall in October 2023, pt has been assisting with 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  Pt currently cutting foods for meal prep in sitting position. Medication management: wife assisting Handwriting:  TBA  MOBILITY STATUS: Needs Assist: CGA with mobility with RW and Hx of falls  POSTURE COMMENTS:  rounded shoulders and forward head  FUNCTIONAL OUTCOME MEASURES: Physical performance test: PPT #4 donning/doffing jacket: unable to complete due to LOB* 5 time sit > stand:  1:05.94 with up to mod assist and cues to scoot forward towards edge of chair and needing to push up from RW.  UPPER EXTREMITY ROM:  all movements are slowed, decreased shoulder flexion d/t ankylosing spondylitis  Active ROM Right eval Left eval  Shoulder flexion 90 90  Shoulder abduction    Shoulder adduction    Shoulder extension    Shoulder internal rotation [facility]  Shoulder external rotation [facility]  Elbow flexion [facility] WFL  Elbow extension [facility] WFL  Wrist flexion    Wrist extension    Wrist ulnar deviation    Wrist radial deviation    Wrist pronation    Wrist supination    (Blank rows = not tested)  UPPER EXTREMITY MMT:     MMT Right eval Left eval  Shoulder flexion 4/5 4/5  Shoulder abduction    Shoulder adduction    Shoulder extension    Shoulder internal rotation    Shoulder external rotation    Middle trapezius    Lower trapezius    Elbow flexion 4/5 4/5  Elbow extension 4/5 4/5  Wrist flexion    Wrist extension    Wrist ulnar deviation    Wrist radial deviation    Wrist pronation    Wrist supination    (Blank rows = not tested)  HAND FUNCTION: TBD  COORDINATION: Slowed movements, TBA at future session  COGNITION: Overall cognitive status:  Pt's spouse reports that his thinking seems slower, but attributes it to stress from all issues presenting s/p fall in October  VISION: Subjective report: glasses broke with recent fall Baseline vision: Wears  glasses all the time Visual history: cataracts  VISION ASSESSMENT: Not tested  Patient has difficulty with following activities due to following visual impairments: currently difficult to assess as pt does not have glasses with him, as they broke during fall in October 2023.  OBSERVATIONS: Pt with slowed response time and movements during transfers to/from treatment space and during assessment.   TODAY'S TREATMENT:    08/07/22 Bag exercises: Attempted to engage in bag exercises with focus on large amplitude UE movements to carry over to functional tasks of bathing and dressing.  Engaged in [place] reaching forward and overhead to simulate donning shirt.  Pt demonstrating decreased B shoulder ROM, L > R, therefore transitioned to single UE ROM.   Large amplitude: OT providing cues for upright posture to facilitate increased UE ROM.  Engaged in forward reach, overhead reach, and  reaching out to side with LUE, RUE, and attempts at [place] with reaching forward and overhead.  Pt benefiting from demonstration, mod-max verbal cues, and increased time for processing. Functional transfers: pt reports fall at home, therefore reiterated RW placement prior to and during transitional movements.  Pt reports that he got tangled up in his feet and/or RW when trying to get up and move from his recliner.  Upon further assessment, pt may have been attempting to stand and then reach across body to retreive RW causing self to lose balance.  Blocked practice with sit <> stand and ambulatory transfers with RW placement, pt still requiring question cues for recall of RW placement.   08/05/22 5x sit > stand: pt attempted sit > stand x5 with pt able to come up to full standing with UE on thighs and/or on chair demonstrating improvements in sit > stand from initial treatment sessions when pt pulling up on RW.  OT providing CGA for sit > stand vs up to mod assist initially.  Pt is demonstrating improved technique and improvements  with sit > stand, however still requiring ~1 min due to instability and inability to complete 5 in a row.   Dynamic standing: completed table top task with pt standing 3-4 mins without UE support or assistance.  Pt demonstrating mild sway in all directions but no LOB.   Box and Blocks: R: 37 blocks and L: 38 blocks Pipe tree: completed additional standing task while completing pipe tree puzzle to increase standing tolerance as needed to increase engagement in IADLs.  Pt requiring max multimodal cues for correct sequencing with pipe tree puzzle replication.  Pt tolerated standing 8 mins without LOB or need for rest break. Energy conservation: OT educating on energy conservation strategies for ADLs and IADLs providing specifics for bathing, dressing, meal prep.   Pt able to recognize areas in which he could improve.  OT provided with handout from OT toolkit to take home to discuss further with wife.   07/31/22 Engaged in bed mobility with focus on large amplitude movements with swinging legs in to bed, bridging and scooting hips prior to rolling over, positioning prior to transitioning from sit > supine in bed.  OT providing demonstration and mod verbal and tactile cues for sequencing.  Pt requiring tactile cues at legs for large amplitude stepping/lifting pattern when bringing legs in to bed and when positioning in bed prior to rolling on to side.  Engaged in bridging and scooting hips R and L prior to rolling to facilitate increased alignment prior to rolling.  Pt demonstrating improved sequencing of sit <> sidelying as well as scooting over in bed with min cues and repetition. AE: discussed 3 in 1 vs shower chair with arm rests for walk-in shower at home.  Pt would benefit form shower hair with arm rests as neither pt nor spouse need to be moving 3 in 1 in/out of shower due to fall risk.  OT demonstrated and educated on side stepping method for shower transfers, will benefit from practice with stepping over  ~5 inch ledge.                                          PATIENT EDUCATION: Education details: Educated on therapeutic exercises for ROM and RW placement during transitional movements. Person educated: Patient and Spouse Education method: Explanation, Demonstration, and Verbal cues Education comprehension: verbalized  understanding and needs further education  HOME EXERCISE PROGRAM: TBD   GOALS: Goals reviewed with patient? Yes  SHORT TERM GOALS: Target date: 08/16/22  Pt and wife will report understanding of adaptive techniques and/or potential DME/AE needs to increase ease, safety, and independence w/ ADLs. Baseline: Goal status: IN PROGRESS  2.  Pt will perform dynamic standing task for 5 mins as needed for simple IADLs w/o LOB using DME and/or countertop support prn  Baseline: decreased standing tolerance Goal status: IN PROGRESS  3.  Pt will demonstrate improved ease with sit > stand to get up from toilet or other seating options in home by improving score on sit > stand to decreased fall risk. Baseline: 1:05.94 Goal status: IN PROGRESS  4.  Pt will complete UB dressing (except clothing fasteners) with supervision. Baseline: wife assists with buttons occasionally prior to fall Goal status: IN PROGRESS   LONG TERM GOALS: Target date: 09/13/22  Pt will demonstrate shower transfers with DME PRN at supervision level. Baseline: currently sponge bathing due to fearfulness of showering, but wants to return to showers.   Goal status: IN PROGRESS  2.  Pt will report increased participation in IADLs (such as cooking) to increase independence with functional IADLs.  Baseline:  Goal status: IN PROGRESS  3.  Pt will complete UB dressing, to include clothing fasteners, at Mod I level with use of AE and/or alternative strategies PRN. Baseline:  Goal status: INITIAL  4.  Pt will complete LB dressing with Supervision/setup with use of AE and/or alternative strategies PRN. Baseline:   Goal status: IN PROGRESS  5.  Pt will demonstrate improved dynamic standing balance for 15 mins as needed to engage in ADLs/IADLs with increased safety/endurance.   Baseline:  Goal status: IN PROGRESS  6. Pt will demonstrate increased ease with dressing as evidenced by decreasing PPT#4(don/ doff jacket) by 10 secs or more.  Baseline: TBD  Goal status: IN PROGRESS  ASSESSMENT:  CLINICAL IMPRESSION: Pt benefiting from multimodal cues with demonstration, verbal cues, and tactile cues for improved carryover of ROM exercises as well as increased wait time and question cues for safety with RW placement prior to and during transitional movements.  Pt will benefit from continued practice and cues due to decreased carryover.  PERFORMANCE DEFICITS: in functional skills including ADLs, IADLs, ROM, strength, pain, flexibility, Fine motor control, Gross motor control, mobility, balance, body mechanics, endurance, decreased knowledge of precautions, decreased knowledge of use of DME, and UE functional use, cognitive skills including attention, memory, safety awareness, sequencing, and thought, and psychosocial skills including environmental adaptation and routines and behaviors.   IMPAIRMENTS: are limiting patient from ADLs and IADLs.   CO-MORBIDITIES: may have co-morbidities  that affects occupational performance. Patient will benefit from skilled OT to address above impairments and improve overall function.  MODIFICATION OR ASSISTANCE TO COMPLETE EVALUATION: Min-Moderate modification of tasks or assist with assess necessary to complete an evaluation.  OT OCCUPATIONAL PROFILE AND HISTORY: Detailed assessment: Review of records and additional review of physical, cognitive, psychosocial history related to current functional performance.  CLINICAL DECISION MAKING: Moderate - several treatment options, min-mod task modification necessary  REHAB POTENTIAL: Good  EVALUATION COMPLEXITY:  Moderate    PLAN:  OT FREQUENCY: 2x/week  OT DURATION: 8 weeks  PLANNED INTERVENTIONS: self care/ADL training, therapeutic exercise, therapeutic activity, neuromuscular re-education, manual therapy, balance training, functional mobility training, ultrasound, biofeedback, compression bandaging, moist heat, cryotherapy, patient/family education, cognitive remediation/compensation, visual/perceptual remediation/compensation, psychosocial skills training, energy conservation, coping strategies training,  and DME and/or AE instructions  RECOMMENDED OTHER SERVICES: N/A  CONSULTED AND AGREED WITH PLAN OF CARE: Patient and family member/caregiver  PLAN FOR NEXT SESSION:  BUE ROM for dressing with focus on large amplitude and posture; Reiterate DME/AE for bathroom transfers and safety/independence with ADLs/IADLs. Practice shower transfers with stepping over shower ledge and continue to discuss DME/AE for bathroom transfers and dressing tasks.  Dynamic standing balance/endurance as well as energy conservation strategies.   Simonne Come, OTR/L 08/07/2022, 4:27 PM

## 2022-08-07 NOTE — Therapy (Signed)
OUTPATIENT PHYSICAL THERAPY NEURO TREATMENT   Patient Name: [name] MRN: [id number] DOB:1935-09-02, 86 y.o., male Today's Date: 08/07/2022   PCP: Donnajean Lopes, MD REFERRING PROVIDER: Star Age, MD  END OF SESSION:  PT End of Session - 08/07/22 1442     Visit Number 6    Number of Visits 16    Date for PT Re-Evaluation 09/10/22    Authorization Type Medicare    Progress Note Due on Visit 10    PT Start Time 1445    PT Stop Time 7616    PT Time Calculation (min) 45 min    Equipment Utilized During Treatment Gait belt    Activity Tolerance Patient tolerated treatment well    Behavior During Therapy WFL for tasks assessed/performed              Past Medical History:  Diagnosis Date   Ankylosing spondylitis ([place]) dx'd ~ 1974   Arthritis    "back" (08/10/2015)   BENIGN PROSTATIC HYPERTROPHY, WITH OBSTRUCTION 01/15/2010   Bleeding duodenal ulcer    Bleeding esophageal ulcer    Bleeding stomach ulcer    GERD 02/22/2008   History of blood transfusion 1988   "lost ~ 1/2 of my blood volume from multiple bleeding ulcers"   History of hiatal hernia    HYPERGLYCEMIA 11/18/2007   HYPERLIPIDEMIA 02/22/2008   HYPERTENSION, UNSPECIFIED 11/10/2009   Kidney stones    Osteoarthritis    c-spine   PEPTIC ULCER DISEASE 11/17/2007   Situational depression    "son died in [place] 06/19/15"   TIA (transient ischemic attack)    "not that I know of in the past; they are trying to determine if I've had one today (08/10/2015)"   Past Surgical History:  Procedure Laterality Date   CATARACT EXTRACTION W/ INTRAOCULAR LENS  IMPLANT, BILATERAL Bilateral 2013   Patient Active Problem List   Diagnosis Date Noted   Ankylosing spondylitis of cervicothoracic region ([place]) 10/16/2016   TIA (transient ischemic attack) 08/10/2015   Carotid stenosis 08/04/2014   Hyperlipidemia 02/21/2012   BENIGN PROSTATIC HYPERTROPHY, WITH OBSTRUCTION 01/15/2010   Essential hypertension 11/10/2009   GERD  02/22/2008   NEPHROLITHIASIS, HX OF 11/17/2007    ONSET DATE: 18-Jun-2022, PD for 4 years  REFERRING DIAG: Z91.81 (ICD-10-CM) - History of recent fall G20.C (ICD-10-CM) - Primary parkinsonism  THERAPY DIAG:  Muscle weakness (generalized)  Abnormal posture  Unsteadiness on feet  Other abnormalities of gait and mobility  Difficulty in walking, not elsewhere classified  Rationale for Evaluation and Treatment: Rehabilitation  SUBJECTIVE:  SUBJECTIVE STATEMENT: "No falls per say, but did have some episodes of bouncing off the walls"    Pt accompanied by: significant other  PERTINENT HISTORY: PD, complex medical history of peptic ulcer disease with history of upper GI bleed, TIA, depression, degenerative disc disease in the neck, osteoarthritis, kidney stones, hyperlipidemia, hypertension, history of hiatal hernia, BPH, arthritis, and ankylosing spondylitis, and parkinsonism  PAIN:  Are you having pain? No, none more than usual  PRECAUTIONS: None  WEIGHT BEARING RESTRICTIONS: No  FALLS: Has patient fallen in last 6 months? Yes. Number of falls "several"  LIVING ENVIRONMENT: Lives with: lives with their spouse Lives in: House/apartment Stairs:  stairs inside home to loft, ramp to enter/exit home Has following equipment at home: Gilford Rile - 2 wheeled, since the fall in October  PLOF: [place]: return to PLOF  OBJECTIVE:   TODAY'S TREATMENT: 08/07/22 Activity Comments  High row 2x10 15# For postural strength and posterior shoulder strength  Low row 1x10 15#,20#,25#, better form and stability  Right reactions -holding BUE, single UE, no UE support and performing hip extension (step foot back) to colored dot for target 1x10  -push-release posterior 10x--cues for large  amplitude and reactive balance strategies  Standing large split stance to stretch hip flex/adductor/gastroc 4x15 sec  Gait training W/ RW and cues for large step length and target for number of steps        TODAY'S TREATMENT: 08/05/22 Activity Comments  Seated PWR! moves Up, rock, twist--difficulty with leaving midline with twist  Cable rows High pull 2x10 15# Low pull 2x10 15# To improve postural strength and improve pulling power  LAQ 3x10 5#  Seated hip flexion 2x10 5#  Standing with weight stack support with chained belt at 50# -weight shift post-ant 10x -standing hip flexion 2x10 5# ankle weights -postural re-education standing tall 3x30 sec eyes closed  Gait  training Verbal/visual cues for improving step length         HOME EXERCISE PROGRAM Last updated: 07/24/22 Access Code: [email or web address] URL: [email or web address] Date: 07/24/2022 Prepared by: [facility]  Program Notes perform with gait belt and wife's supervision  Exercises - Standing Balance in Corner  - 1 x daily - 7 x weekly - 3 sets - 30 sec hold - Narrow Stance with Counter Support  - 1 x daily - 5 x weekly - 2 sets - 10 reps - 30 sec hold - Standing Balance in Corner with Eyes Closed  - 1 x daily - 5 x weekly - 2 sets - 10 reps - 30 sec hold - Sit to Stand with Counter Support  - 1 x daily - 5 x weekly - 2 sets - 5-10 reps - Supine Lower Trunk Rotation  - 1 x daily - 7 x weekly - 3 sets - 10 reps - Supine Bridge  - 1 x daily - 7 x weekly - 3 sets - 10 reps - Thoracic Extension Mobilization on Foam Roll  - 1 x daily - 7 x weekly - 3 sets - 10 reps - 3 sec hold - Seated Shoulder Horizontal Abduction with Resistance  - 1 x daily - 7 x weekly - 3 sets - 10 reps    PATIENT EDUCATION: Education details: review/clarification and update to HEP- to be performed with gait belt, wife's supervision, walker in front, and wall behind for max safety Person educated:  Patient and Spouse Education method: Explanation, Demonstration, Tactile cues, Verbal cues, and  Handouts Education comprehension: verbalized understanding and returned demonstration    Below measures were taken at time of initial evaluation unless otherwise specified:   DIAGNOSTIC FINDINGS: no intracranial or osseous injuries  COGNITION: Overall cognitive status: Within functional limits for tasks assessed   SENSATION: WFL  COORDINATION: Alternating movements impaired, right ankle limitations pre-morbid from hx of crush injury in childhood Heel to shin mildly impaired right > left Finger to nose WNL Finger opposition: unable to perform bilaterally  EDEMA:  none  MUSCLE TONE: NT  MUSCLE LENGTH: Presents with bilateral knee flexion contractures, approx 5-10 degrees  DTRs:    POSTURE: rounded shoulders, forward head, and increased thoracic kyphosis  LOWER EXTREMITY ROM:     Active  Right Eval Left Eval  Hip flexion    Hip extension    Hip abduction    Hip adduction    Hip internal rotation    Hip external rotation    Knee flexion    Knee extension -10 -10  Ankle dorsiflexion 5 10  Ankle plantarflexion    Ankle inversion    Ankle eversion     (Blank rows = not tested)  LOWER EXTREMITY MMT:  assessed in sitting  MMT Right Eval Left Eval  Hip flexion 4 4  Hip extension    Hip abduction 3+ 3+  Hip adduction 4- 4-  Hip internal rotation    Hip external rotation    Knee flexion 4 4  Knee extension 4- 4-  Ankle dorsiflexion 2+ 3+  Ankle plantarflexion    Ankle inversion    Ankle eversion    (Blank rows = not tested)  BED MOBILITY:  Mod A  TRANSFERS: Assistive device utilized: Environmental consultant - 2 wheeled  Sit to stand: CGA and Min A Stand to sit: SBA Chair to chair: CGA and Min A Floor:  DNT  RAMP:  NT  CURB:  Level of Assistance: CGA Assistive device utilized: Environmental consultant - 2 wheeled Curb Comments:   STAIRS: NT  GAIT: Gait pattern:  right knee  extensor thrust in stance, shuffling, and trunk flexed, deficits during turning Distance walked:  Assistive device utilized: Environmental consultant - 2 wheeled Level of assistance: CGA and Min A Comments: unsteady, deficits during turns  FUNCTIONAL TESTS:  5 times sit to stand: unable to complete, retro-pulsion Timed up and go (TUG): 29 sec Push and release test (# of steps): Anterior: absent  Posterior: absent  M-CTSIB  Condition 1: Firm Surface, EO 30 Sec, Mild Sway  Condition 2: Firm Surface, EC 30 Sec, Mild and Moderate Sway  Condition 3: Foam Surface, EO 0 Sec,  unable  Sway  Condition 4: Foam Surface, EC - Sec,  -  Sway     VESTIBULAR ASSESSMENT   GENERAL OBSERVATION: right eyebrow ptosis?    SYMPTOM BEHAVIOR:   Subjective history: fall and strike to anterior right forehead   Non-Vestibular symptoms: denies   Type of dizziness: Imbalance (Disequilibrium) and Lightheadedness/Faint   Frequency: "depends"   Duration: "whenever"   Aggravating factors: No known aggravating factors   Relieving factors: no known relieving factors   Progression of symptoms: unchanged   OCULOMOTOR EXAM:   Ocular Alignment: right eye ptosis   Ocular ROM: No Limitations   Spontaneous Nystagmus: absent   Gaze-Induced Nystagmus: age appropriate nystagmus at end range   Smooth Pursuits: saccades   Saccades: hypometric/undershoots, extra eye movements, and slow   Convergence/Divergence: 10 inches      VESTIBULAR - OCULAR REFLEX:    Slow VOR:  Comment: difficulty in tracking   VOR Cancellation: Comment: NT   Head-Impulse Test: NT   Dynamic Visual Acuity: Not able to be assessed    POSITIONAL TESTING: unable to test due to time limitation    MOTION SENSITIVITY:    Motion Sensitivity Quotient  Intensity: 0 = none, 1 = Lightheaded, 2 = Mild, 3 = Moderate, 4 = Severe, 5 = Vomiting  Intensity  1. Sitting to supine   2. Supine to L side   3. Supine to R side   4. Supine to sitting   5. L Hallpike-Dix    6. Up from L    7. R Hallpike-Dix   8. Up from R    9. Sitting, head  tipped to L knee   10. Head up from L  knee   11. Sitting, head  tipped to R knee   12. Head up from R  knee   13. Sitting head turns x5   14.Sitting head nods x5   15. In stance, 180  turn to L    16. In stance, 180  turn to R     OTHOSTATICS: not done  FUNCTIONAL GAIT:    VESTIBULAR TREATMENT:  Canalith Repositioning:   TBD Gaze Adaptation:   TBD Habituation:   TBD Other:  PATIENT SURVEYS:    PATIENT EDUCATION: Education details: assessment findings/interpretations Person educated: Patient and Spouse Education method: Explanation Education comprehension: verbalized understanding   GOALS: Goals reviewed with patient? Yes  SHORT TERM GOALS: Target date: 08/13/2022    Patient will perform HEP with family/caregiver supervision for improved strength, balance, transfers, gaze adaptation, habituation, balance, and gait  Baseline: Goal status: IN PROGRESS  2.  Demonstrate functional transfers with supervision level with or without AD to improve safety with functional mobility Baseline: CGA-min A Goal status: IN PROGRESS  3.  Manifest improved BLE strength as evidenced by ability to complete 5xSTS test in 25 sec to improve safety with mobility Baseline: unable Goal status: IN PROGRESS   LONG TERM GOALS: Target date: 09/10/2022    Demonstrate reduce risk for falls per time 15 sec TUG test with least restrictive AD Baseline: 29 sec w/ RW Goal status: IN PROGRESS  2.  Demo ability to transfer and ambulate over various surfaces x 300 ft with modified independence to reduce level of assistance from caregiver Baseline: CGA-min A w/ RW Goal status: IN PROGRESS  3.  Demonstrate improved reactive balance as evidenced by ability to enact 2-3 steps with push-release test to facilitate righting reactions and reduce risk for falls Baseline: Absent, LOB Goal status: IN  PROGRESS   ASSESSMENT:  CLINICAL IMPRESSION: Activities to improve motor control and large amplitude movements and promote hip extension to facilitate stepping strategy to improve righting reactions to reduce risk for falls. Able to perform activities with tapered feedback initially requiring hand-over-hand cues progressing to verbal/visual instruction. Continued difficulty with large amplitude stepping strategy. Continued sessions to advance balance, strength, motor control, and postural re-education  OBJECTIVE IMPAIRMENTS: Abnormal gait, decreased activity tolerance, decreased balance, decreased coordination, decreased knowledge of use of DME, difficulty walking, decreased ROM, decreased strength, dizziness, impaired flexibility, improper body mechanics, and postural dysfunction.   ACTIVITY LIMITATIONS: carrying, lifting, bending, standing, squatting, stairs, transfers, bed mobility, bathing, dressing, reach over head, and locomotion level  PARTICIPATION LIMITATIONS: meal prep, cleaning, laundry, shopping, community activity, and yard work  PERSONAL FACTORS: Age, Time since onset of injury/illness/exacerbation, and 3+ comorbidities: see medical hx  are also affecting patient's  functional outcome.   REHAB POTENTIAL: Good  CLINICAL DECISION MAKING: Evolving/moderate complexity  EVALUATION COMPLEXITY: Moderate  PLAN:  PT FREQUENCY: 2x/week  PT DURATION: 8 weeks  PLANNED INTERVENTIONS: Therapeutic exercises, Therapeutic activity, Neuromuscular re-education, Balance training, Gait training, Patient/Family education, Self Care, Joint mobilization, Stair training, Vestibular training, Canalith repositioning, DME instructions, Aquatic Therapy, Dry Needling, Electrical stimulation, Wheelchair mobility training, Spinal mobilization, Cryotherapy, Moist heat, Taping, and Manual therapy  PLAN FOR NEXT SESSION: supine/seated PWR! Moves, corner balance trials    2:42 PM, 08/07/22 M. Sherlyn Lees, PT, DPT Physical Therapist- [place] Office Number: [phone]   [facility] [address], [place], [place] [address] Phone # [phone] Fax # [phone]

## 2022-08-09 DIAGNOSIS — R339 Retention of urine, unspecified: Secondary | ICD-10-CM | POA: Diagnosis not present

## 2022-08-09 DIAGNOSIS — R300 Dysuria: Secondary | ICD-10-CM | POA: Diagnosis not present

## 2022-08-09 DIAGNOSIS — N401 Enlarged prostate with lower urinary tract symptoms: Secondary | ICD-10-CM | POA: Diagnosis not present

## 2022-08-09 DIAGNOSIS — N138 Other obstructive and reflux uropathy: Secondary | ICD-10-CM | POA: Diagnosis not present

## 2022-08-09 DIAGNOSIS — R3589 Other polyuria: Secondary | ICD-10-CM | POA: Diagnosis not present

## 2022-08-12 ENCOUNTER — Ambulatory Visit: Payer: Medicare Other

## 2022-08-12 ENCOUNTER — Ambulatory Visit: Payer: Medicare Other | Admitting: Occupational Therapy

## 2022-08-12 DIAGNOSIS — M6281 Muscle weakness (generalized): Secondary | ICD-10-CM

## 2022-08-12 DIAGNOSIS — R293 Abnormal posture: Secondary | ICD-10-CM

## 2022-08-12 DIAGNOSIS — R2681 Unsteadiness on feet: Secondary | ICD-10-CM | POA: Diagnosis not present

## 2022-08-12 DIAGNOSIS — R2689 Other abnormalities of gait and mobility: Secondary | ICD-10-CM

## 2022-08-12 DIAGNOSIS — R29818 Other symptoms and signs involving the nervous system: Secondary | ICD-10-CM | POA: Diagnosis not present

## 2022-08-12 DIAGNOSIS — R262 Difficulty in walking, not elsewhere classified: Secondary | ICD-10-CM

## 2022-08-12 NOTE — Therapy (Signed)
OUTPATIENT PHYSICAL THERAPY NEURO TREATMENT   Patient Name: [name] MRN: [id number] DOB:1935-10-19, 86 y.o., male Today's Date: 08/12/2022   PCP: Donnajean Lopes, MD REFERRING PROVIDER: Star Age, MD  END OF SESSION:  PT End of Session - 08/12/22 1452     Visit Number 7    Number of Visits 16    Date for PT Re-Evaluation 09/10/22    Authorization Type Medicare    Progress Note Due on Visit 10    PT Start Time 1445    PT Stop Time 4462    PT Time Calculation (min) 45 min    Equipment Utilized During Treatment Gait belt    Activity Tolerance Patient tolerated treatment well    Behavior During Therapy WFL for tasks assessed/performed              Past Medical History:  Diagnosis Date   Ankylosing spondylitis ([place]) dx'd ~ 1974   Arthritis    "back" (08/10/2015)   BENIGN PROSTATIC HYPERTROPHY, WITH OBSTRUCTION 01/15/2010   Bleeding duodenal ulcer    Bleeding esophageal ulcer    Bleeding stomach ulcer    GERD 02/22/2008   History of blood transfusion 1988   "lost ~ 1/2 of my blood volume from multiple bleeding ulcers"   History of hiatal hernia    HYPERGLYCEMIA 11/18/2007   HYPERLIPIDEMIA 02/22/2008   HYPERTENSION, UNSPECIFIED 11/10/2009   Kidney stones    Osteoarthritis    c-spine   PEPTIC ULCER DISEASE 11/17/2007   Situational depression    "son died in [place] 06/23/15"   TIA (transient ischemic attack)    "not that I know of in the past; they are trying to determine if I've had one today (08/10/2015)"   Past Surgical History:  Procedure Laterality Date   CATARACT EXTRACTION W/ INTRAOCULAR LENS  IMPLANT, BILATERAL Bilateral 2013   Patient Active Problem List   Diagnosis Date Noted   Ankylosing spondylitis of cervicothoracic region ([place]) 10/16/2016   TIA (transient ischemic attack) 08/10/2015   Carotid stenosis 08/04/2014   Hyperlipidemia 02/21/2012   BENIGN PROSTATIC HYPERTROPHY, WITH OBSTRUCTION 01/15/2010   Essential hypertension 11/10/2009   GERD  02/22/2008   NEPHROLITHIASIS, HX OF 11/17/2007    ONSET DATE: 06-22-2022, PD for 4 years  REFERRING DIAG: Z91.81 (ICD-10-CM) - History of recent fall G20.C (ICD-10-CM) - Primary parkinsonism  THERAPY DIAG:  Muscle weakness (generalized)  Unsteadiness on feet  Abnormal posture  Other abnormalities of gait and mobility  Difficulty in walking, not elsewhere classified  Rationale for Evaluation and Treatment: Rehabilitation  SUBJECTIVE:  SUBJECTIVE STATEMENT: Feeling pretty good today.     Pt accompanied by: significant other  PERTINENT HISTORY: PD, complex medical history of peptic ulcer disease with history of upper GI bleed, TIA, depression, degenerative disc disease in the neck, osteoarthritis, kidney stones, hyperlipidemia, hypertension, history of hiatal hernia, BPH, arthritis, and ankylosing spondylitis, and parkinsonism  PAIN:  Are you having pain? No, none more than usual  PRECAUTIONS: None  WEIGHT BEARING RESTRICTIONS: No  FALLS: Has patient fallen in last 6 months? Yes. Number of falls "several"  LIVING ENVIRONMENT: Lives with: lives with their spouse Lives in: House/apartment Stairs:  stairs inside home to loft, ramp to enter/exit home Has following equipment at home: Gilford Rile - 2 wheeled, since the fall in October  PLOF: [place]: return to PLOF  OBJECTIVE:   TODAY'S TREATMENT: 08/12/22 Activity Comments  Seated rows with bar 3x12 reps 15-25#  PWR! Up between rows Cues for sequence and amplitude and verbal counting  Standing single arm row 2x10 15# for arm strength/posture correct  Sidestep x 60 sec Along counter, tactile cues for hip alignment  Retrowalk x 60 sec  Along counter, cues for step length  March x 2 min Along counter, cues for step  height  Foot on step Elevated on aerobics step and climbing finger ladder  Gait training -use of visual cues for large step length and negotiating turns with larger/continuous steps x 3 min -retrowalking x 3 min cues for large step for hip extension        TODAY'S TREATMENT: 08/07/22 Activity Comments  High row 2x10 15# For postural strength and posterior shoulder strength  Low row 1x10 15#,20#,25#, better form and stability  Right reactions -holding BUE, single UE, no UE support and performing hip extension (step foot back) to colored dot for target 1x10  -push-release posterior 10x--cues for large amplitude and reactive balance strategies  Standing large split stance to stretch hip flex/adductor/gastroc 4x15 sec  Gait training W/ RW and cues for large step length and target for number of steps         HOME EXERCISE PROGRAM Last updated: 07/24/22 Access Code: [email or web address] URL: [email or web address] Date: 07/24/2022 Prepared by: [facility]  Program Notes perform with gait belt and wife's supervision  Exercises - Standing Balance in Corner  - 1 x daily - 7 x weekly - 3 sets - 30 sec hold - Narrow Stance with Counter Support  - 1 x daily - 5 x weekly - 2 sets - 10 reps - 30 sec hold - Standing Balance in Corner with Eyes Closed  - 1 x daily - 5 x weekly - 2 sets - 10 reps - 30 sec hold - Sit to Stand with Counter Support  - 1 x daily - 5 x weekly - 2 sets - 5-10 reps - Supine Lower Trunk Rotation  - 1 x daily - 7 x weekly - 3 sets - 10 reps - Supine Bridge  - 1 x daily - 7 x weekly - 3 sets - 10 reps - Thoracic Extension Mobilization on Foam Roll  - 1 x daily - 7 x weekly - 3 sets - 10 reps - 3 sec hold - Seated Shoulder Horizontal Abduction with Resistance  - 1 x daily - 7 x weekly - 3 sets - 10 reps    PATIENT EDUCATION: Education details: review/clarification and update to HEP- to be performed with gait belt, wife's  supervision, walker in front,  and wall behind for max safety Person educated: Patient and Spouse Education method: Explanation, Demonstration, Tactile cues, Verbal cues, and Handouts Education comprehension: verbalized understanding and returned demonstration    Below measures were taken at time of initial evaluation unless otherwise specified:   DIAGNOSTIC FINDINGS: no intracranial or osseous injuries  COGNITION: Overall cognitive status: Within functional limits for tasks assessed   SENSATION: WFL  COORDINATION: Alternating movements impaired, right ankle limitations pre-morbid from hx of crush injury in childhood Heel to shin mildly impaired right > left Finger to nose WNL Finger opposition: unable to perform bilaterally  EDEMA:  none  MUSCLE TONE: NT  MUSCLE LENGTH: Presents with bilateral knee flexion contractures, approx 5-10 degrees  DTRs:    POSTURE: rounded shoulders, forward head, and increased thoracic kyphosis  LOWER EXTREMITY ROM:     Active  Right Eval Left Eval  Hip flexion    Hip extension    Hip abduction    Hip adduction    Hip internal rotation    Hip external rotation    Knee flexion    Knee extension -10 -10  Ankle dorsiflexion 5 10  Ankle plantarflexion    Ankle inversion    Ankle eversion     (Blank rows = not tested)  LOWER EXTREMITY MMT:  assessed in sitting  MMT Right Eval Left Eval  Hip flexion 4 4  Hip extension    Hip abduction 3+ 3+  Hip adduction 4- 4-  Hip internal rotation    Hip external rotation    Knee flexion 4 4  Knee extension 4- 4-  Ankle dorsiflexion 2+ 3+  Ankle plantarflexion    Ankle inversion    Ankle eversion    (Blank rows = not tested)  BED MOBILITY:  Mod A  TRANSFERS: Assistive device utilized: Environmental consultant - 2 wheeled  Sit to stand: CGA and Min A Stand to sit: SBA Chair to chair: CGA and Min A Floor:  DNT  RAMP:  NT  CURB:  Level of Assistance: CGA Assistive device utilized: Environmental consultant -  2 wheeled Curb Comments:   STAIRS: NT  GAIT: Gait pattern:  right knee extensor thrust in stance, shuffling, and trunk flexed, deficits during turning Distance walked:  Assistive device utilized: Environmental consultant - 2 wheeled Level of assistance: CGA and Min A Comments: unsteady, deficits during turns  FUNCTIONAL TESTS:  5 times sit to stand: unable to complete, retro-pulsion Timed up and go (TUG): 29 sec Push and release test (# of steps): Anterior: absent  Posterior: absent  M-CTSIB  Condition 1: Firm Surface, EO 30 Sec, Mild Sway  Condition 2: Firm Surface, EC 30 Sec, Mild and Moderate Sway  Condition 3: Foam Surface, EO 0 Sec,  unable  Sway  Condition 4: Foam Surface, EC - Sec,  -  Sway     VESTIBULAR ASSESSMENT   GENERAL OBSERVATION: right eyebrow ptosis?    SYMPTOM BEHAVIOR:   Subjective history: fall and strike to anterior right forehead   Non-Vestibular symptoms: denies   Type of dizziness: Imbalance (Disequilibrium) and Lightheadedness/Faint   Frequency: "depends"   Duration: "whenever"   Aggravating factors: No known aggravating factors   Relieving factors: no known relieving factors   Progression of symptoms: unchanged   OCULOMOTOR EXAM:   Ocular Alignment: right eye ptosis   Ocular ROM: No Limitations   Spontaneous Nystagmus: absent   Gaze-Induced Nystagmus: age appropriate nystagmus at end range   Smooth Pursuits: saccades   Saccades: hypometric/undershoots, extra eye movements, and  slow   Convergence/Divergence: 10 inches      VESTIBULAR - OCULAR REFLEX:    Slow VOR: Comment: difficulty in tracking   VOR Cancellation: Comment: NT   Head-Impulse Test: NT   Dynamic Visual Acuity: Not able to be assessed    POSITIONAL TESTING: unable to test due to time limitation    MOTION SENSITIVITY:    Motion Sensitivity Quotient  Intensity: 0 = none, 1 = Lightheaded, 2 = Mild, 3 = Moderate, 4 = Severe, 5 = Vomiting  Intensity  1. Sitting to supine   2. Supine to L  side   3. Supine to R side   4. Supine to sitting   5. L Hallpike-Dix   6. Up from L    7. R Hallpike-Dix   8. Up from R    9. Sitting, head  tipped to L knee   10. Head up from L  knee   11. Sitting, head  tipped to R knee   12. Head up from R  knee   13. Sitting head turns x5   14.Sitting head nods x5   15. In stance, 180  turn to L    16. In stance, 180  turn to R     OTHOSTATICS: not done  FUNCTIONAL GAIT:    VESTIBULAR TREATMENT:  Canalith Repositioning:   TBD Gaze Adaptation:   TBD Habituation:   TBD Other:  PATIENT SURVEYS:    PATIENT EDUCATION: Education details: assessment findings/interpretations Person educated: Patient and Spouse Education method: Explanation Education comprehension: verbalized understanding   GOALS: Goals reviewed with patient? Yes  SHORT TERM GOALS: Target date: 08/13/2022    Patient will perform HEP with family/caregiver supervision for improved strength, balance, transfers, gaze adaptation, habituation, balance, and gait  Baseline: Goal status: IN PROGRESS  2.  Demonstrate functional transfers with supervision level with or without AD to improve safety with functional mobility Baseline: CGA-min A Goal status: IN PROGRESS  3.  Manifest improved BLE strength as evidenced by ability to complete 5xSTS test in 25 sec to improve safety with mobility Baseline: unable Goal status: IN PROGRESS   LONG TERM GOALS: Target date: 09/10/2022    Demonstrate reduce risk for falls per time 15 sec TUG test with least restrictive AD Baseline: 29 sec w/ RW Goal status: IN PROGRESS  2.  Demo ability to transfer and ambulate over various surfaces x 300 ft with modified independence to reduce level of assistance from caregiver Baseline: CGA-min A w/ RW Goal status: IN PROGRESS  3.  Demonstrate improved reactive balance as evidenced by ability to enact 2-3 steps with push-release test to facilitate righting reactions and reduce risk  for falls Baseline: Absent, LOB Goal status: IN PROGRESS   ASSESSMENT:  CLINICAL IMPRESSION: Emphasis on postural strengthening and interspersing large amplitude PWR movements between sets requiring hand-over-hand guidance for sequence and maintenance. Continued with activities for improved step length/height to improve gait cycle. Improved carryover by end of session although difficulty with negotiating 180 degree turns with step length. Continued sessions to advance motor control, balance, and gait  training ot reduce risk for falls  OBJECTIVE IMPAIRMENTS: Abnormal gait, decreased activity tolerance, decreased balance, decreased coordination, decreased knowledge of use of DME, difficulty walking, decreased ROM, decreased strength, dizziness, impaired flexibility, improper body mechanics, and postural dysfunction.   ACTIVITY LIMITATIONS: carrying, lifting, bending, standing, squatting, stairs, transfers, bed mobility, bathing, dressing, reach over head, and locomotion level  PARTICIPATION LIMITATIONS: meal prep, cleaning, laundry, shopping, community activity,  and yard work  PERSONAL FACTORS: Age, Time since onset of injury/illness/exacerbation, and 3+ comorbidities: see medical hx  are also affecting patient's functional outcome.   REHAB POTENTIAL: Good  CLINICAL DECISION MAKING: Evolving/moderate complexity  EVALUATION COMPLEXITY: Moderate  PLAN:  PT FREQUENCY: 2x/week  PT DURATION: 8 weeks  PLANNED INTERVENTIONS: Therapeutic exercises, Therapeutic activity, Neuromuscular re-education, Balance training, Gait training, Patient/Family education, Self Care, Joint mobilization, Stair training, Vestibular training, Canalith repositioning, DME instructions, Aquatic Therapy, Dry Needling, Electrical stimulation, Wheelchair mobility training, Spinal mobilization, Cryotherapy, Moist heat, Taping, and Manual therapy  PLAN FOR NEXT SESSION: Practice steps!    2:53 PM, 08/12/22 M. Sherlyn Lees, PT, DPT Physical Therapist- [facility] Office Number: [phone]   [place] [facility] [address], [place], [place] [address] Phone # [phone] Fax # [phone]

## 2022-08-12 NOTE — Therapy (Signed)
OUTPATIENT OCCUPATIONAL THERAPY Treatment Session  Patient Name: [name] MRN: [id number] DOB:1936/03/04, 86 y.o., male Today's Date: 08/12/2022  PCP: Donnajean Lopes, MD REFERRING PROVIDER: Star Age, MD  END OF SESSION:  OT End of Session - 08/12/22 1401     Visit Number 7    Number of Visits 17    Date for OT Re-Evaluation 09/13/22    Authorization Type Medicare A &B    OT Start Time 8101    OT Stop Time 7510    OT Time Calculation (min) 40 min                   Past Medical History:  Diagnosis Date   Ankylosing spondylitis ([place]) dx'd ~ 1974   Arthritis    "back" (08/10/2015)   BENIGN PROSTATIC HYPERTROPHY, WITH OBSTRUCTION 01/15/2010   Bleeding duodenal ulcer    Bleeding esophageal ulcer    Bleeding stomach ulcer    GERD 02/22/2008   History of blood transfusion 1988   "lost ~ 1/2 of my blood volume from multiple bleeding ulcers"   History of hiatal hernia    HYPERGLYCEMIA 11/18/2007   HYPERLIPIDEMIA 02/22/2008   HYPERTENSION, UNSPECIFIED 11/10/2009   Kidney stones    Osteoarthritis    c-spine   PEPTIC ULCER DISEASE 11/17/2007   Situational depression    "son died in [place] Jul 05, 2015"   TIA (transient ischemic attack)    "not that I know of in the past; they are trying to determine if I've had one today (08/10/2015)"   Past Surgical History:  Procedure Laterality Date   CATARACT EXTRACTION W/ INTRAOCULAR LENS  IMPLANT, BILATERAL Bilateral 2013   Patient Active Problem List   Diagnosis Date Noted   Ankylosing spondylitis of cervicothoracic region ([place]) 10/16/2016   TIA (transient ischemic attack) 08/10/2015   Carotid stenosis 08/04/2014   Hyperlipidemia 02/21/2012   BENIGN PROSTATIC HYPERTROPHY, WITH OBSTRUCTION 01/15/2010   Essential hypertension 11/10/2009   GERD 02/22/2008   NEPHROLITHIASIS, HX OF 11/17/2007    ONSET DATE: referral date 07/04/22   REFERRING DIAG: G20.C (ICD-10-CM) - Parkinsonism, unspecified Z91.81 (ICD-10-CM) - History  of falling  THERAPY DIAG:  Other symptoms and signs involving the nervous system  Muscle weakness (generalized)  Unsteadiness on feet  Abnormal posture  Rationale for Evaluation and Treatment: Rehabilitation  SUBJECTIVE:   SUBJECTIVE STATEMENT: Pt's spouse reports that she has been looking at shower seat equipment, but has not made any decisions yet.  Pt accompanied by: self  PERTINENT HISTORY: history of Parkinson's and ankylosing spondylitis   PRECAUTIONS: Fall  WEIGHT BEARING RESTRICTIONS: No  PAIN:  Are you having pain? No  FALLS: Has patient fallen in last 6 months? Yes. Number of falls 6 (3-4 since "big fall" in 07/05/2023)  LIVING ENVIRONMENT: Lives with: lives with their spouse Lives in: House/apartment Stairs: Yes: External: 2-3 steps; has had a ramp built just in last week Internal: steps to basement and steps to loft - however pt does not need to go up/down those at this time. Has following equipment at home: [place] Rile - 2 wheeled, bed side commode, Ramped entry, and transport chair  PLOF: Independent  PATIENT GOALS: to be able to feel confident in any sort of movement to resume prior activities  OBJECTIVE:   HAND DOMINANCE: Right  ADLs: Overall ADLs: Prior to fall in July 04, 2022, pt Independent with all ADLs and IADLs without use of AE/DME, even completing shower in standing in walk-in shower. Transfers/ambulation related to ADLs: fluctuates  with mobility; now using RW for all mobility, Supervision with all mobility Eating: Mod I Grooming: wife will assist with opening items; otherwise setup and supervision due to balance UB Dressing: Min-mod assist, buttons/zippers are quite challenging LB Dressing: Mod assist, is able to don socks/shoes with increased time Toileting: Max assist Bathing: sponge baths since fall, max assist from wife at this time Tub Shower transfers: has walk-in shower, but does not feel safe in shower since fall in Oct Equipment: bed side  commode  IADLs: Meal Prep: since fall in October 2023, pt has been assisting with 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  Pt currently cutting foods for meal prep in sitting position. Medication management: wife assisting Handwriting:  TBA  MOBILITY STATUS: Needs Assist: CGA with mobility with RW and Hx of falls  POSTURE COMMENTS:  rounded shoulders and forward head  FUNCTIONAL OUTCOME MEASURES: Physical performance test: PPT #4 donning/doffing jacket: unable to complete due to LOB* 5 time sit > stand:  1:05.94 with up to mod assist and cues to scoot forward towards edge of chair and needing to push up from RW.  UPPER EXTREMITY ROM:  all movements are slowed, decreased shoulder flexion d/t ankylosing spondylitis  Active ROM Right eval Left eval  Shoulder flexion 90 90  Shoulder abduction    Shoulder adduction    Shoulder extension    Shoulder internal rotation [facility]  Shoulder external rotation [facility]  Elbow flexion [facility] WFL  Elbow extension [facility] WFL  Wrist flexion    Wrist extension    Wrist ulnar deviation    Wrist radial deviation    Wrist pronation    Wrist supination    (Blank rows = not tested)  UPPER EXTREMITY MMT:     MMT Right eval Left eval  Shoulder flexion 4/5 4/5  Shoulder abduction    Shoulder adduction    Shoulder extension    Shoulder internal rotation    Shoulder external rotation    Middle trapezius    Lower trapezius    Elbow flexion 4/5 4/5  Elbow extension 4/5 4/5  Wrist flexion    Wrist extension    Wrist ulnar deviation    Wrist radial deviation    Wrist pronation    Wrist supination    (Blank rows = not tested)  HAND FUNCTION: TBD  COORDINATION: Slowed movements, TBA at future session  COGNITION: Overall cognitive status:  Pt's spouse reports that his thinking seems slower, but attributes it to stress from all issues presenting s/p fall in October  VISION: Subjective report: glasses broke with recent fall Baseline vision: Wears  glasses all the time Visual history: cataracts  VISION ASSESSMENT: Not tested  Patient has difficulty with following activities due to following visual impairments: currently difficult to assess as pt does not have glasses with him, as they broke during fall in October 2023.  OBSERVATIONS: Pt with slowed response time and movements during transfers to/from treatment space and during assessment.   TODAY'S TREATMENT:    08/12/22 DME/AE: engaged in discussion, showing pictures and options for shower seat.  Pt's spouse feels that pt would benefit from seat with B arm rests, therefore showed options. Simulated shower transfers: engaged in sidestepping over 6" hurdle to simulate sidestepping in/out of walk-in shower.  OT set up shower seat to simulate home setup.  Pt requiring CGA and max multimodal cues for sequencing.  Pt demonstrating decreased step length, inconsistent hand placement, and decreased sequencing requiring massed practice and demonstration,  hand over hand, and verbal cues for sequencing.  Discussed sidestepping in/out of shower and option of sidestepping in and then stepping forward out of shower.  Pt will continue to benefit from practice in clinic prior to attempting in dry shower at home.     08/07/22 Bag exercises: Attempted to engage in bag exercises with focus on large amplitude UE movements to carry over to functional tasks of bathing and dressing.  Engaged in [place] reaching forward and overhead to simulate donning shirt.  Pt demonstrating decreased B shoulder ROM, L > R, therefore transitioned to single UE ROM.   Large amplitude: OT providing cues for upright posture to facilitate increased UE ROM.  Engaged in forward reach, overhead reach, and reaching out to side with LUE, RUE, and attempts at [place] with reaching forward and overhead.  Pt benefiting from demonstration, mod-max verbal cues, and increased time for processing. Functional transfers: pt reports fall at home, therefore  reiterated RW placement prior to and during transitional movements.  Pt reports that he got tangled up in his feet and/or RW when trying to get up and move from his recliner.  Upon further assessment, pt may have been attempting to stand and then reach across body to retreive RW causing self to lose balance.  Blocked practice with sit <> stand and ambulatory transfers with RW placement, pt still requiring question cues for recall of RW placement.   08/05/22 5x sit > stand: pt attempted sit > stand x5 with pt able to come up to full standing with UE on thighs and/or on chair demonstrating improvements in sit > stand from initial treatment sessions when pt pulling up on RW.  OT providing CGA for sit > stand vs up to mod assist initially.  Pt is demonstrating improved technique and improvements with sit > stand, however still requiring ~1 min due to instability and inability to complete 5 in a row.   Dynamic standing: completed table top task with pt standing 3-4 mins without UE support or assistance.  Pt demonstrating mild sway in all directions but no LOB.   Box and Blocks: R: 37 blocks and L: 38 blocks Pipe tree: completed additional standing task while completing pipe tree puzzle to increase standing tolerance as needed to increase engagement in IADLs.  Pt requiring max multimodal cues for correct sequencing with pipe tree puzzle replication.  Pt tolerated standing 8 mins without LOB or need for rest break. Energy conservation: OT educating on energy conservation strategies for ADLs and IADLs providing specifics for bathing, dressing, meal prep.   Pt able to recognize areas in which he could improve.  OT provided with handout from OT toolkit to take home to discuss further with wife.                                          PATIENT EDUCATION: Education details: Educated on RW placement during transitional movements, shower transfers and DME. Person educated: Patient and Spouse Education method:  Explanation, Demonstration, and Verbal cues Education comprehension: verbalized understanding and needs further education  HOME EXERCISE PROGRAM: TBD   GOALS: Goals reviewed with patient? Yes  SHORT TERM GOALS: Target date: 08/16/22  Pt and wife will report understanding of adaptive techniques and/or potential DME/AE needs to increase ease, safety, and independence w/ ADLs. Baseline: Goal status: IN PROGRESS  2.  Pt will perform dynamic standing task for  5 mins as needed for simple IADLs w/o LOB using DME and/or countertop support prn  Baseline: decreased standing tolerance Goal status: IN PROGRESS  3.  Pt will demonstrate improved ease with sit > stand to get up from toilet or other seating options in home by improving score on sit > stand to decreased fall risk. Baseline: 1:05.94 Goal status: IN PROGRESS  4.  Pt will complete UB dressing (except clothing fasteners) with supervision. Baseline: wife assists with buttons occasionally prior to fall Goal status: IN PROGRESS   LONG TERM GOALS: Target date: 09/13/22  Pt will demonstrate shower transfers with DME PRN at supervision level. Baseline: currently sponge bathing due to fearfulness of showering, but wants to return to showers.   Goal status: IN PROGRESS  2.  Pt will report increased participation in IADLs (such as cooking) to increase independence with functional IADLs.  Baseline:  Goal status: IN PROGRESS  3.  Pt will complete UB dressing, to include clothing fasteners, at Mod I level with use of AE and/or alternative strategies PRN. Baseline:  Goal status: IN PROGRESS  4.  Pt will complete LB dressing with Supervision/setup with use of AE and/or alternative strategies PRN. Baseline:  Goal status: IN PROGRESS  5.  Pt will demonstrate improved dynamic standing balance for 15 mins as needed to engage in ADLs/IADLs with increased safety/endurance.   Baseline:  Goal status: IN PROGRESS  6. Pt will demonstrate  increased ease with dressing as evidenced by decreasing PPT#4(don/ doff jacket) by 10 secs or more.  Baseline: TBD  Goal status: IN PROGRESS  ASSESSMENT:  CLINICAL IMPRESSION: Pt demonstrating decreased step length, inconsistent hand placement, and decreased sequencing during simulated shower transfers; requiring massed practice and demonstration, hand over hand, and verbal cues for sequencing.  Pt will continue to benefit from practice in clinic prior to attempting in dry shower at home.    PERFORMANCE DEFICITS: in functional skills including ADLs, IADLs, ROM, strength, pain, flexibility, Fine motor control, Gross motor control, mobility, balance, body mechanics, endurance, decreased knowledge of precautions, decreased knowledge of use of DME, and UE functional use, cognitive skills including attention, memory, safety awareness, sequencing, and thought, and psychosocial skills including environmental adaptation and routines and behaviors.   IMPAIRMENTS: are limiting patient from ADLs and IADLs.   CO-MORBIDITIES: may have co-morbidities  that affects occupational performance. Patient will benefit from skilled OT to address above impairments and improve overall function.  MODIFICATION OR ASSISTANCE TO COMPLETE EVALUATION: Min-Moderate modification of tasks or assist with assess necessary to complete an evaluation.  OT OCCUPATIONAL PROFILE AND HISTORY: Detailed assessment: Review of records and additional review of physical, cognitive, psychosocial history related to current functional performance.  CLINICAL DECISION MAKING: Moderate - several treatment options, min-mod task modification necessary  REHAB POTENTIAL: Good  EVALUATION COMPLEXITY: Moderate    PLAN:  OT FREQUENCY: 2x/week  OT DURATION: 8 weeks  PLANNED INTERVENTIONS: self care/ADL training, therapeutic exercise, therapeutic activity, neuromuscular re-education, manual therapy, balance training, functional mobility training,  ultrasound, biofeedback, compression bandaging, moist heat, cryotherapy, patient/family education, cognitive remediation/compensation, visual/perceptual remediation/compensation, psychosocial skills training, energy conservation, coping strategies training, and DME and/or AE instructions  RECOMMENDED OTHER SERVICES: N/A  CONSULTED AND AGREED WITH PLAN OF CARE: Patient and family member/caregiver  PLAN FOR NEXT SESSION:  BUE ROM for dressing with focus on large amplitude and posture; Reiterate DME/AE for bathroom transfers and safety/independence with ADLs/IADLs. Practice shower transfers with stepping over shower ledge and continue to discuss DME/AE for bathroom transfers  and dressing tasks.  Dynamic standing balance/endurance as well as energy conservation strategies.   Simonne Come, OTR/L 08/12/2022, 2:09 PM

## 2022-08-14 ENCOUNTER — Ambulatory Visit: Payer: Medicare Other | Admitting: Occupational Therapy

## 2022-08-14 ENCOUNTER — Ambulatory Visit: Payer: Medicare Other

## 2022-08-14 DIAGNOSIS — R2689 Other abnormalities of gait and mobility: Secondary | ICD-10-CM | POA: Diagnosis not present

## 2022-08-14 DIAGNOSIS — M6281 Muscle weakness (generalized): Secondary | ICD-10-CM | POA: Diagnosis not present

## 2022-08-14 DIAGNOSIS — R293 Abnormal posture: Secondary | ICD-10-CM

## 2022-08-14 DIAGNOSIS — R29818 Other symptoms and signs involving the nervous system: Secondary | ICD-10-CM | POA: Diagnosis not present

## 2022-08-14 DIAGNOSIS — R262 Difficulty in walking, not elsewhere classified: Secondary | ICD-10-CM | POA: Diagnosis not present

## 2022-08-14 DIAGNOSIS — R2681 Unsteadiness on feet: Secondary | ICD-10-CM | POA: Diagnosis not present

## 2022-08-14 NOTE — Therapy (Signed)
OUTPATIENT PHYSICAL THERAPY NEURO TREATMENT   Patient Name: [name] MRN: [id number] DOB:[birth year], 86 y.o., male Today's Date: 08/14/2022   PCP: Donnajean Lopes, MD REFERRING PROVIDER: Star Age, MD  END OF SESSION:  PT End of Session - 08/14/22 1454     Visit Number 8    Number of Visits 16    Date for PT Re-Evaluation 09/10/22    Authorization Type Medicare    Progress Note Due on Visit 10    PT Start Time 1445    PT Stop Time 2426    PT Time Calculation (min) 45 min    Equipment Utilized During Treatment Gait belt    Activity Tolerance Patient tolerated treatment well    Behavior During Therapy WFL for tasks assessed/performed              Past Medical History:  Diagnosis Date   Ankylosing spondylitis ([place]) dx'd ~ 1974   Arthritis    "back" (08/10/2015)   BENIGN PROSTATIC HYPERTROPHY, WITH OBSTRUCTION 01/15/2010   Bleeding duodenal ulcer    Bleeding esophageal ulcer    Bleeding stomach ulcer    GERD 02/22/2008   History of blood transfusion 1988   "lost ~ 1/2 of my blood volume from multiple bleeding ulcers"   History of hiatal hernia    HYPERGLYCEMIA 11/18/2007   HYPERLIPIDEMIA 02/22/2008   HYPERTENSION, UNSPECIFIED 11/10/2009   Kidney stones    Osteoarthritis    c-spine   PEPTIC ULCER DISEASE 11/17/2007   Situational depression    "son died in [place] 07-04-2015"   TIA (transient ischemic attack)    "not that I know of in the past; they are trying to determine if I've had one today (08/10/2015)"   Past Surgical History:  Procedure Laterality Date   CATARACT EXTRACTION W/ INTRAOCULAR LENS  IMPLANT, BILATERAL Bilateral 2013   Patient Active Problem List   Diagnosis Date Noted   Ankylosing spondylitis of cervicothoracic region ([place]) 10/16/2016   TIA (transient ischemic attack) 08/10/2015   Carotid stenosis 08/04/2014   Hyperlipidemia 02/21/2012   BENIGN PROSTATIC HYPERTROPHY, WITH OBSTRUCTION 01/15/2010   Essential hypertension 11/10/2009   GERD  02/22/2008   NEPHROLITHIASIS, HX OF 11/17/2007    ONSET DATE: 07-03-2022, PD for 4 years  REFERRING DIAG: Z91.81 (ICD-10-CM) - History of recent fall G20.C (ICD-10-CM) - Primary parkinsonism  THERAPY DIAG:  Muscle weakness (generalized)  Unsteadiness on feet  Abnormal posture  Other abnormalities of gait and mobility  Difficulty in walking, not elsewhere classified  Rationale for Evaluation and Treatment: Rehabilitation  SUBJECTIVE:  SUBJECTIVE STATEMENT: Feeling pretty good today.     Pt accompanied by: significant other  PERTINENT HISTORY: PD, complex medical history of peptic ulcer disease with history of upper GI bleed, TIA, depression, degenerative disc disease in the neck, osteoarthritis, kidney stones, hyperlipidemia, hypertension, history of hiatal hernia, BPH, arthritis, and ankylosing spondylitis, and parkinsonism  PAIN:  Are you having pain? No, none more than usual  PRECAUTIONS: None  WEIGHT BEARING RESTRICTIONS: No  FALLS: Has patient fallen in last 6 months? Yes. Number of falls "several"  LIVING ENVIRONMENT: Lives with: lives with their spouse Lives in: House/apartment Stairs:  stairs inside home to loft, ramp to enter/exit home Has following equipment at home: Gilford Rile - 2 wheeled, since the fall in October  PLOF: [place]: return to PLOF  OBJECTIVE:   TODAY'S TREATMENT: 08/14/22 Activity Comments  NU-step x 8 min For fast, alternating movements  Standing cable row 20# 3x12 reps, floor dots for foot placement  Gait training -assisted reciprocal arm swing -gait with large arm swing -attempted UE/LE large amplitude -retrowalk 2x25 ft -sidestep 2x25 ft  Push-release for righting reactions Ant, post, lateral           TODAY'S TREATMENT:  08/12/22 Activity Comments  Seated rows with bar 3x12 reps 15-25#  PWR! Up between rows Cues for sequence and amplitude and verbal counting  Standing single arm row 2x10 15# for arm strength/posture correct  Sidestep x 60 sec Along counter, tactile cues for hip alignment  Retrowalk x 60 sec  Along counter, cues for step length  March x 2 min Along counter, cues for step height  Foot on step Elevated on aerobics step and climbing finger ladder  Gait training -use of visual cues for large step length and negotiating turns with larger/continuous steps x 3 min -retrowalking x 3 min cues for large step for hip extension  Stair ambulation SBA-CGA, cues for step-to pattern when descending due to right ankle deficits          HOME EXERCISE PROGRAM Last updated: 07/24/22 Access Code: [email or web address] URL: [email or web address] Date: 07/24/2022 Prepared by: [facility]  Program Notes perform with gait belt and wife's supervision  Exercises - Standing Balance in Corner  - 1 x daily - 7 x weekly - 3 sets - 30 sec hold - Narrow Stance with Counter Support  - 1 x daily - 5 x weekly - 2 sets - 10 reps - 30 sec hold - Standing Balance in Corner with Eyes Closed  - 1 x daily - 5 x weekly - 2 sets - 10 reps - 30 sec hold - Sit to Stand with Counter Support  - 1 x daily - 5 x weekly - 2 sets - 5-10 reps - Supine Lower Trunk Rotation  - 1 x daily - 7 x weekly - 3 sets - 10 reps - Supine Bridge  - 1 x daily - 7 x weekly - 3 sets - 10 reps - Thoracic Extension Mobilization on Foam Roll  - 1 x daily - 7 x weekly - 3 sets - 10 reps - 3 sec hold - Seated Shoulder Horizontal Abduction with Resistance  - 1 x daily - 7 x weekly - 3 sets - 10 reps    PATIENT EDUCATION: Education details: review/clarification and update to HEP- to be performed with gait belt, wife's supervision, walker in front, and wall behind for max safety Person educated: Patient and  Spouse Education method:  Explanation, Demonstration, Tactile cues, Verbal cues, and Handouts Education comprehension: verbalized understanding and returned demonstration    Below measures were taken at time of initial evaluation unless otherwise specified:   DIAGNOSTIC FINDINGS: no intracranial or osseous injuries  COGNITION: Overall cognitive status: Within functional limits for tasks assessed   SENSATION: WFL  COORDINATION: Alternating movements impaired, right ankle limitations pre-morbid from hx of crush injury in childhood Heel to shin mildly impaired right > left Finger to nose WNL Finger opposition: unable to perform bilaterally  EDEMA:  none  MUSCLE TONE: NT  MUSCLE LENGTH: Presents with bilateral knee flexion contractures, approx 5-10 degrees  DTRs:    POSTURE: rounded shoulders, forward head, and increased thoracic kyphosis  LOWER EXTREMITY ROM:     Active  Right Eval Left Eval  Hip flexion    Hip extension    Hip abduction    Hip adduction    Hip internal rotation    Hip external rotation    Knee flexion    Knee extension -10 -10  Ankle dorsiflexion 5 10  Ankle plantarflexion    Ankle inversion    Ankle eversion     (Blank rows = not tested)  LOWER EXTREMITY MMT:  assessed in sitting  MMT Right Eval Left Eval  Hip flexion 4 4  Hip extension    Hip abduction 3+ 3+  Hip adduction 4- 4-  Hip internal rotation    Hip external rotation    Knee flexion 4 4  Knee extension 4- 4-  Ankle dorsiflexion 2+ 3+  Ankle plantarflexion    Ankle inversion    Ankle eversion    (Blank rows = not tested)  BED MOBILITY:  Mod A  TRANSFERS: Assistive device utilized: Environmental consultant - 2 wheeled  Sit to stand: CGA and Min A Stand to sit: SBA Chair to chair: CGA and Min A Floor:  DNT  RAMP:  NT  CURB:  Level of Assistance: CGA Assistive device utilized: Environmental consultant - 2 wheeled Curb Comments:   STAIRS: NT  GAIT: Gait pattern:  right knee extensor  thrust in stance, shuffling, and trunk flexed, deficits during turning Distance walked:  Assistive device utilized: Environmental consultant - 2 wheeled Level of assistance: CGA and Min A Comments: unsteady, deficits during turns  FUNCTIONAL TESTS:  5 times sit to stand: unable to complete, retro-pulsion Timed up and go (TUG): 29 sec Push and release test (# of steps): Anterior: absent  Posterior: absent  M-CTSIB  Condition 1: Firm Surface, EO 30 Sec, Mild Sway  Condition 2: Firm Surface, EC 30 Sec, Mild and Moderate Sway  Condition 3: Foam Surface, EO 0 Sec,  unable  Sway  Condition 4: Foam Surface, EC - Sec,  -  Sway     VESTIBULAR ASSESSMENT   GENERAL OBSERVATION: right eyebrow ptosis?    SYMPTOM BEHAVIOR:   Subjective history: fall and strike to anterior right forehead   Non-Vestibular symptoms: denies   Type of dizziness: Imbalance (Disequilibrium) and Lightheadedness/Faint   Frequency: "depends"   Duration: "whenever"   Aggravating factors: No known aggravating factors   Relieving factors: no known relieving factors   Progression of symptoms: unchanged   OCULOMOTOR EXAM:   Ocular Alignment: right eye ptosis   Ocular ROM: No Limitations   Spontaneous Nystagmus: absent   Gaze-Induced Nystagmus: age appropriate nystagmus at end range   Smooth Pursuits: saccades   Saccades: hypometric/undershoots, extra eye movements, and slow   Convergence/Divergence: 10 inches      VESTIBULAR -  OCULAR REFLEX:    Slow VOR: Comment: difficulty in tracking   VOR Cancellation: Comment: NT   Head-Impulse Test: NT   Dynamic Visual Acuity: Not able to be assessed    POSITIONAL TESTING: unable to test due to time limitation    MOTION SENSITIVITY:    Motion Sensitivity Quotient  Intensity: 0 = none, 1 = Lightheaded, 2 = Mild, 3 = Moderate, 4 = Severe, 5 = Vomiting  Intensity  1. Sitting to supine   2. Supine to L side   3. Supine to R side   4. Supine to sitting   5. L Hallpike-Dix   6. Up from  L    7. R Hallpike-Dix   8. Up from R    9. Sitting, head  tipped to L knee   10. Head up from L  knee   11. Sitting, head  tipped to R knee   12. Head up from R  knee   13. Sitting head turns x5   14.Sitting head nods x5   15. In stance, 180  turn to L    16. In stance, 180  turn to R     OTHOSTATICS: not done  FUNCTIONAL GAIT:    VESTIBULAR TREATMENT:  Canalith Repositioning:   TBD Gaze Adaptation:   TBD Habituation:   TBD Other:  PATIENT SURVEYS:    PATIENT EDUCATION: Education details: assessment findings/interpretations Person educated: Patient and Spouse Education method: Explanation Education comprehension: verbalized understanding   GOALS: Goals reviewed with patient? Yes  SHORT TERM GOALS: Target date: 08/13/2022    Patient will perform HEP with family/caregiver supervision for improved strength, balance, transfers, gaze adaptation, habituation, balance, and gait  Baseline: Goal status: IN PROGRESS  2.  Demonstrate functional transfers with supervision level with or without AD to improve safety with functional mobility Baseline: CGA-min A Goal status: IN PROGRESS  3.  Manifest improved BLE strength as evidenced by ability to complete 5xSTS test in 25 sec to improve safety with mobility Baseline: unable Goal status: IN PROGRESS   LONG TERM GOALS: Target date: 09/10/2022    Demonstrate reduce risk for falls per time 15 sec TUG test with least restrictive AD Baseline: 29 sec w/ RW Goal status: IN PROGRESS  2.  Demo ability to transfer and ambulate over various surfaces x 300 ft with modified independence to reduce level of assistance from caregiver Baseline: CGA-min A w/ RW Goal status: IN PROGRESS  3.  Demonstrate improved reactive balance as evidenced by ability to enact 2-3 steps with push-release test to facilitate righting reactions and reduce risk for falls Baseline: Absent, LOB Goal status: IN  PROGRESS   ASSESSMENT:  CLINICAL IMPRESSION: Continued with activities for improved balance and coordination as well as facilitation of righting reactions. Difficulty with large amplitude movements and maintaining coordination for reicprocal pattern. Stair ambulation performed with cues for sequence and safety in descending due to right ankle deficits  OBJECTIVE IMPAIRMENTS: Abnormal gait, decreased activity tolerance, decreased balance, decreased coordination, decreased knowledge of use of DME, difficulty walking, decreased ROM, decreased strength, dizziness, impaired flexibility, improper body mechanics, and postural dysfunction.   ACTIVITY LIMITATIONS: carrying, lifting, bending, standing, squatting, stairs, transfers, bed mobility, bathing, dressing, reach over head, and locomotion level  PARTICIPATION LIMITATIONS: meal prep, cleaning, laundry, shopping, community activity, and yard work  PERSONAL FACTORS: Age, Time since onset of injury/illness/exacerbation, and 3+ comorbidities: see medical hx  are also affecting patient's functional outcome.   REHAB POTENTIAL: Good  CLINICAL DECISION  MAKING: Evolving/moderate complexity  EVALUATION COMPLEXITY: Moderate  PLAN:  PT FREQUENCY: 2x/week  PT DURATION: 8 weeks  PLANNED INTERVENTIONS: Therapeutic exercises, Therapeutic activity, Neuromuscular re-education, Balance training, Gait training, Patient/Family education, Self Care, Joint mobilization, Stair training, Vestibular training, Canalith repositioning, DME instructions, Aquatic Therapy, Dry Needling, Electrical stimulation, Wheelchair mobility training, Spinal mobilization, Cryotherapy, Moist heat, Taping, and Manual therapy  PLAN FOR NEXT SESSION: Practice steps!    2:55 PM, 08/14/22 M. Sherlyn Lees, PT, DPT Physical Therapist- [place] Office Number: [phone]   [facility] [address], [place], [place]  [address] Phone # [phone] Fax # [phone]

## 2022-08-14 NOTE — Therapy (Signed)
OUTPATIENT OCCUPATIONAL THERAPY Treatment Session  Patient Name: [name] MRN: [id number] DOB:Oct 29, 1935, 86 y.o., male Today's Date: 08/14/2022  PCP: Donnajean Lopes, MD REFERRING PROVIDER: Star Age, MD  END OF SESSION:  OT End of Session - 08/14/22 1411     Visit Number 8    Number of Visits 17    Date for OT Re-Evaluation 09/13/22    Authorization Type Medicare A &B    OT Start Time 9892    OT Stop Time 1194    OT Time Calculation (min) 40 min                    Past Medical History:  Diagnosis Date   Ankylosing spondylitis ([place]) dx'd ~ 1974   Arthritis    "back" (08/10/2015)   BENIGN PROSTATIC HYPERTROPHY, WITH OBSTRUCTION 01/15/2010   Bleeding duodenal ulcer    Bleeding esophageal ulcer    Bleeding stomach ulcer    GERD 02/22/2008   History of blood transfusion 1988   "lost ~ 1/2 of my blood volume from multiple bleeding ulcers"   History of hiatal hernia    HYPERGLYCEMIA 11/18/2007   HYPERLIPIDEMIA 02/22/2008   HYPERTENSION, UNSPECIFIED 11/10/2009   Kidney stones    Osteoarthritis    c-spine   PEPTIC ULCER DISEASE 11/17/2007   Situational depression    "son died in [place] 06-18-2015"   TIA (transient ischemic attack)    "not that I know of in the past; they are trying to determine if I've had one today (08/10/2015)"   Past Surgical History:  Procedure Laterality Date   CATARACT EXTRACTION W/ INTRAOCULAR LENS  IMPLANT, BILATERAL Bilateral 2013   Patient Active Problem List   Diagnosis Date Noted   Ankylosing spondylitis of cervicothoracic region ([place]) 10/16/2016   TIA (transient ischemic attack) 08/10/2015   Carotid stenosis 08/04/2014   Hyperlipidemia 02/21/2012   BENIGN PROSTATIC HYPERTROPHY, WITH OBSTRUCTION 01/15/2010   Essential hypertension 11/10/2009   GERD 02/22/2008   NEPHROLITHIASIS, HX OF 11/17/2007    ONSET DATE: referral date 07/04/22   REFERRING DIAG: G20.C (ICD-10-CM) - Parkinsonism, unspecified Z91.81 (ICD-10-CM) -  History of falling  THERAPY DIAG:  Muscle weakness (generalized)  Unsteadiness on feet  Abnormal posture  Rationale for Evaluation and Treatment: Rehabilitation  SUBJECTIVE:   SUBJECTIVE STATEMENT: Pt reports that he is awaiting a medication to arrive via [employer] for his ankylospondilitis.    Pt accompanied by: self  PERTINENT HISTORY: history of Parkinson's and ankylosing spondylitis   PRECAUTIONS: Fall  WEIGHT BEARING RESTRICTIONS: No  PAIN:  Are you having pain? No  FALLS: Has patient fallen in last 6 months? Yes. Number of falls 6 (3-4 since "big fall" in June 18, 2023)  LIVING ENVIRONMENT: Lives with: lives with their spouse Lives in: House/apartment Stairs: Yes: External: 2-3 steps; has had a ramp built just in last week Internal: steps to basement and steps to loft - however pt does not need to go up/down those at this time. Has following equipment at home: [employer] Rile - 2 wheeled, bed side commode, Ramped entry, and transport chair  PLOF: Independent  PATIENT GOALS: to be able to feel confident in any sort of movement to resume prior activities  OBJECTIVE:   HAND DOMINANCE: Right  ADLs: Overall ADLs: Prior to fall in 06-17-2022, pt Independent with all ADLs and IADLs without use of AE/DME, even completing shower in standing in walk-in shower. Transfers/ambulation related to ADLs: fluctuates with mobility; now using RW for all mobility, Supervision with  all mobility Eating: Mod I Grooming: wife will assist with opening items; otherwise setup and supervision due to balance UB Dressing: Min-mod assist, buttons/zippers are quite challenging LB Dressing: Mod assist, is able to don socks/shoes with increased time Toileting: Max assist Bathing: sponge baths since fall, max assist from wife at this time Tub Shower transfers: has walk-in shower, but does not feel safe in shower since fall in Oct Equipment: bed side commode  IADLs: Meal Prep: since fall in October 2023, pt has  been assisting with 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  Pt currently cutting foods for meal prep in sitting position. Medication management: wife assisting Handwriting:  TBA  MOBILITY STATUS: Needs Assist: CGA with mobility with RW and Hx of falls  POSTURE COMMENTS:  rounded shoulders and forward head  FUNCTIONAL OUTCOME MEASURES: Physical performance test: PPT #4 donning/doffing jacket: unable to complete due to LOB* 5 time sit > stand:  1:05.94 with up to mod assist and cues to scoot forward towards edge of chair and needing to push up from RW.  UPPER EXTREMITY ROM:  all movements are slowed, decreased shoulder flexion d/t ankylosing spondylitis  Active ROM Right eval Left eval  Shoulder flexion 90 90  Shoulder abduction    Shoulder adduction    Shoulder extension    Shoulder internal rotation [facility]  Shoulder external rotation [facility]  Elbow flexion [facility] WFL  Elbow extension [facility] WFL  Wrist flexion    Wrist extension    Wrist ulnar deviation    Wrist radial deviation    Wrist pronation    Wrist supination    (Blank rows = not tested)  UPPER EXTREMITY MMT:     MMT Right eval Left eval  Shoulder flexion 4/5 4/5  Shoulder abduction    Shoulder adduction    Shoulder extension    Shoulder internal rotation    Shoulder external rotation    Middle trapezius    Lower trapezius    Elbow flexion 4/5 4/5  Elbow extension 4/5 4/5  Wrist flexion    Wrist extension    Wrist ulnar deviation    Wrist radial deviation    Wrist pronation    Wrist supination    (Blank rows = not tested)  HAND FUNCTION: TBD  COORDINATION: Slowed movements, TBA at future session  COGNITION: Overall cognitive status:  Pt's spouse reports that his thinking seems slower, but attributes it to stress from all issues presenting s/p fall in October  VISION: Subjective report: glasses broke with recent fall Baseline vision: Wears glasses all the time Visual history: cataracts  VISION  ASSESSMENT: Not tested  Patient has difficulty with following activities due to following visual impairments: currently difficult to assess as pt does not have glasses with him, as they broke during fall in October 2023.  OBSERVATIONS: Pt with slowed response time and movements during transfers to/from treatment space and during assessment.   TODAY'S TREATMENT:    08/14/22 5x sit > stand: 43.94 seconds.  Pt continues to demonstrate posterior bias, impacting ability to stand fully uprights on first attempt each time. Pt is able to complete sit > stand without use of UE and no longer pulling up on RW!  OT providing min cues for anterior weight shift and scooting forward prior to standing. ADL: Pt reports still needing assistance with getting shirt on sometimes, however spouse reports noticing significant improvements.  Wife reports that sometimes shirt is still getting caught on shoulders.  Engaged in simulated  UB dressing with hospital gown.  Pt demonstrating difficulty getting second sleeve around body during first attempt, however with improvements on second attempt.  OT providing min cues and assistance for donning simulated button up shirt.  OT providing demonstration and verbal cues for large amplitude movement and cues for hand placement to increase success with doffing shirt.  Discussed and demonstrated hand placement to increase ease with donning pull over shirt.   Transitional movements: pt demonstrating improved sequencing and RW placement during stand pivot transfers mat <> chair. Dynamic standing:  Pt tolerated standing 5 mins during pipe tree puzzle to challenge use of BUE during standing activity.  Pt with no LOB and good endurance during table top tasks.  Pt did require mod cues for sequencing of tasks, discussed other puzzle activities to challenge cognition.   08/12/22 DME/AE: engaged in discussion, showing pictures and options for shower seat.  Pt's spouse feels that pt would  benefit from seat with B arm rests, therefore showed options. Simulated shower transfers: engaged in sidestepping over 6" hurdle to simulate sidestepping in/out of walk-in shower.  OT set up shower seat to simulate home setup.  Pt requiring CGA and max multimodal cues for sequencing.  Pt demonstrating decreased step length, inconsistent hand placement, and decreased sequencing requiring massed practice and demonstration, hand over hand, and verbal cues for sequencing.  Discussed sidestepping in/out of shower and option of sidestepping in and then stepping forward out of shower.  Pt will continue to benefit from practice in clinic prior to attempting in dry shower at home.     08/07/22 Bag exercises: Attempted to engage in bag exercises with focus on large amplitude UE movements to carry over to functional tasks of bathing and dressing.  Engaged in Early reaching forward and overhead to simulate donning shirt.  Pt demonstrating decreased B shoulder ROM, L > R, therefore transitioned to single UE ROM.   Large amplitude: OT providing cues for upright posture to facilitate increased UE ROM.  Engaged in forward reach, overhead reach, and reaching out to side with LUE, RUE, and attempts at [place] with reaching forward and overhead.  Pt benefiting from demonstration, mod-max verbal cues, and increased time for processing. Functional transfers: pt reports fall at home, therefore reiterated RW placement prior to and during transitional movements.  Pt reports that he got tangled up in his feet and/or RW when trying to get up and move from his recliner.  Upon further assessment, pt may have been attempting to stand and then reach across body to retreive RW causing self to lose balance.  Blocked practice with sit <> stand and ambulatory transfers with RW placement, pt still requiring question cues for recall of RW placement.  PATIENT EDUCATION: Education details: ongoing condition specific education.   Person educated:  Patient and Spouse Education method: Explanation, Demonstration, and Verbal cues Education comprehension: verbalized understanding and needs further education  HOME EXERCISE PROGRAM: TBD   GOALS: Goals reviewed with patient? Yes  SHORT TERM GOALS: Target date: 08/16/22  Pt and wife will report understanding of adaptive techniques and/or potential DME/AE needs to increase ease, safety, and independence w/ ADLs. Baseline: Goal status: MET - spouse looking in to shower chair options to return to bathing at shower level on 08/14/22  2.  Pt will perform dynamic standing task for 5 mins as needed for simple IADLs w/o LOB using DME and/or countertop support prn  Baseline: decreased standing tolerance Goal status: MET - 08/14/22  3.  Pt will  demonstrate improved ease with sit > stand to get up from toilet or other seating options in home by improving score on sit > stand to decreased fall risk. Baseline: 1:05.94 Goal status: MET - 43.94 sec on 08/14/22  4.  Pt will complete UB dressing (except clothing fasteners) with supervision. Baseline: wife assists with buttons occasionally prior to fall Goal status: NOT MET - wife still needing to assist some days, but not other days   LONG TERM GOALS: Target date: 09/13/22  Pt will demonstrate shower transfers with DME PRN at supervision level. Baseline: currently sponge bathing due to fearfulness of showering, but wants to return to showers.   Goal status: IN PROGRESS  2.  Pt will report increased participation in IADLs (such as cooking) to increase independence with functional IADLs.  Baseline:  Goal status: IN PROGRESS  3.  Pt will complete UB dressing, to include clothing fasteners, at Mod I level with use of AE and/or alternative strategies PRN. Baseline:  Goal status: IN PROGRESS  4.  Pt will complete LB dressing with Supervision/setup with use of AE and/or alternative strategies PRN. Baseline:  Goal status: IN PROGRESS  5.  Pt  will demonstrate improved dynamic standing balance for 15 mins as needed to engage in ADLs/IADLs with increased safety/endurance.   Baseline:  Goal status: IN PROGRESS  6. Pt will demonstrate increased ease with dressing as evidenced by decreasing PPT#4(don/ doff jacket) by 10 secs or more.  Baseline: TBD  Goal status: IN PROGRESS  ASSESSMENT:  CLINICAL IMPRESSION: Pt demonstrating posterior bias with sit > stand requiring demonstration and verbal cues for anterior weight shift during transitional movements. Pt continues to demonstrate difficulty with pull over and button up shirts due to decreased trunk and shoulder mobility.   PERFORMANCE DEFICITS: in functional skills including ADLs, IADLs, ROM, strength, pain, flexibility, Fine motor control, Gross motor control, mobility, balance, body mechanics, endurance, decreased knowledge of precautions, decreased knowledge of use of DME, and UE functional use, cognitive skills including attention, memory, safety awareness, sequencing, and thought, and psychosocial skills including environmental adaptation and routines and behaviors.   IMPAIRMENTS: are limiting patient from ADLs and IADLs.   CO-MORBIDITIES: may have co-morbidities  that affects occupational performance. Patient will benefit from skilled OT to address above impairments and improve overall function.  MODIFICATION OR ASSISTANCE TO COMPLETE EVALUATION: Min-Moderate modification of tasks or assist with assess necessary to complete an evaluation.  OT OCCUPATIONAL PROFILE AND HISTORY: Detailed assessment: Review of records and additional review of physical, cognitive, psychosocial history related to current functional performance.  CLINICAL DECISION MAKING: Moderate - several treatment options, min-mod task modification necessary  REHAB POTENTIAL: Good  EVALUATION COMPLEXITY: Moderate    PLAN:  OT FREQUENCY: 2x/week  OT DURATION: 8 weeks  PLANNED INTERVENTIONS: self care/ADL  training, therapeutic exercise, therapeutic activity, neuromuscular re-education, manual therapy, balance training, functional mobility training, ultrasound, biofeedback, compression bandaging, moist heat, cryotherapy, patient/family education, cognitive remediation/compensation, visual/perceptual remediation/compensation, psychosocial skills training, energy conservation, coping strategies training, and DME and/or AE instructions  RECOMMENDED OTHER SERVICES: N/A  CONSULTED AND AGREED WITH PLAN OF CARE: Patient and family member/caregiver  PLAN FOR NEXT SESSION:  BUE ROM for dressing with focus on large amplitude and posture; Reiterate DME/AE for bathroom transfers and safety/independence with ADLs/IADLs. Practice shower transfers with stepping over shower ledge and continue to discuss DME/AE for bathroom transfers and dressing tasks.  Dynamic standing balance/endurance as well as energy conservation strategies.   Simonne Come, OTR/L 08/14/2022, 2:12 PM

## 2022-08-21 ENCOUNTER — Ambulatory Visit: Payer: Medicare Other | Admitting: Occupational Therapy

## 2022-08-21 ENCOUNTER — Ambulatory Visit: Payer: Medicare Other

## 2022-08-21 DIAGNOSIS — R262 Difficulty in walking, not elsewhere classified: Secondary | ICD-10-CM

## 2022-08-21 DIAGNOSIS — R2689 Other abnormalities of gait and mobility: Secondary | ICD-10-CM

## 2022-08-21 DIAGNOSIS — R2681 Unsteadiness on feet: Secondary | ICD-10-CM | POA: Diagnosis not present

## 2022-08-21 DIAGNOSIS — R293 Abnormal posture: Secondary | ICD-10-CM

## 2022-08-21 DIAGNOSIS — M6281 Muscle weakness (generalized): Secondary | ICD-10-CM

## 2022-08-21 DIAGNOSIS — R278 Other lack of coordination: Secondary | ICD-10-CM

## 2022-08-21 DIAGNOSIS — R29818 Other symptoms and signs involving the nervous system: Secondary | ICD-10-CM | POA: Diagnosis not present

## 2022-08-21 NOTE — Therapy (Signed)
OUTPATIENT OCCUPATIONAL THERAPY Treatment Session  Patient Name: [name] MRN: [id number] DOB:08/13/1936, 86 y.o., male Today's Date: 08/21/2022  PCP: Donnajean Lopes, MD REFERRING PROVIDER: Star Age, MD  END OF SESSION:  OT End of Session - 08/21/22 1401     Visit Number 9    Number of Visits 17    Date for OT Re-Evaluation 09/13/22    Authorization Type Medicare A &B    OT Start Time 1401    OT Stop Time 7017    OT Time Calculation (min) 45 min                     Past Medical History:  Diagnosis Date   Ankylosing spondylitis ([place]) dx'd ~ 1974   Arthritis    "back" (08/10/2015)   BENIGN PROSTATIC HYPERTROPHY, WITH OBSTRUCTION 01/15/2010   Bleeding duodenal ulcer    Bleeding esophageal ulcer    Bleeding stomach ulcer    GERD 02/22/2008   History of blood transfusion 1988   "lost ~ 1/2 of my blood volume from multiple bleeding ulcers"   History of hiatal hernia    HYPERGLYCEMIA 11/18/2007   HYPERLIPIDEMIA 02/22/2008   HYPERTENSION, UNSPECIFIED 11/10/2009   Kidney stones    Osteoarthritis    c-spine   PEPTIC ULCER DISEASE 11/17/2007   Situational depression    "son died in [place] 01-Jul-2015"   TIA (transient ischemic attack)    "not that I know of in the past; they are trying to determine if I've had one today (08/10/2015)"   Past Surgical History:  Procedure Laterality Date   CATARACT EXTRACTION W/ INTRAOCULAR LENS  IMPLANT, BILATERAL Bilateral 2013   Patient Active Problem List   Diagnosis Date Noted   Ankylosing spondylitis of cervicothoracic region ([place]) 10/16/2016   TIA (transient ischemic attack) 08/10/2015   Carotid stenosis 08/04/2014   Hyperlipidemia 02/21/2012   BENIGN PROSTATIC HYPERTROPHY, WITH OBSTRUCTION 01/15/2010   Essential hypertension 11/10/2009   GERD 02/22/2008   NEPHROLITHIASIS, HX OF 11/17/2007    ONSET DATE: referral date 07/04/22   REFERRING DIAG: G20.C (ICD-10-CM) - Parkinsonism, unspecified Z91.81 (ICD-10-CM) -  History of falling  THERAPY DIAG:  Other symptoms and signs involving the nervous system  Muscle weakness (generalized)  Unsteadiness on feet  Abnormal posture  Other lack of coordination  Rationale for Evaluation and Treatment: Rehabilitation  SUBJECTIVE:   SUBJECTIVE STATEMENT: Pt's spouse reports that she has gotten him to the gym a few times.    Pt accompanied by: self  PERTINENT HISTORY: history of Parkinson's and ankylosing spondylitis   PRECAUTIONS: Fall  WEIGHT BEARING RESTRICTIONS: No  PAIN:  Are you having pain? No  FALLS: Has patient fallen in last 6 months? Yes. Number of falls 6 (3-4 since "big fall" in 07-01-23)  LIVING ENVIRONMENT: Lives with: lives with their spouse Lives in: House/apartment Stairs: Yes: External: 2-3 steps; has had a ramp built just in last week Internal: steps to basement and steps to loft - however pt does not need to go up/down those at this time. Has following equipment at home: [employer] Rile - 2 wheeled, bed side commode, Ramped entry, and transport chair  PLOF: Independent  PATIENT GOALS: to be able to feel confident in any sort of movement to resume prior activities  OBJECTIVE:   HAND DOMINANCE: Right  ADLs: Overall ADLs: Prior to fall in 30-Jun-2022, pt Independent with all ADLs and IADLs without use of AE/DME, even completing shower in standing in walk-in shower. Transfers/ambulation  related to ADLs: fluctuates with mobility; now using RW for all mobility, Supervision with all mobility Eating: Mod I Grooming: wife will assist with opening items; otherwise setup and supervision due to balance UB Dressing: Min-mod assist, buttons/zippers are quite challenging LB Dressing: Mod assist, is able to don socks/shoes with increased time Toileting: Max assist Bathing: sponge baths since fall, max assist from wife at this time Tub Shower transfers: has walk-in shower, but does not feel safe in shower since fall in Oct Equipment: bed side  commode  IADLs: Meal Prep: since fall in October 2023, pt has been assisting with 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  Pt currently cutting foods for meal prep in sitting position. Medication management: wife assisting Handwriting:  TBA  MOBILITY STATUS: Needs Assist: CGA with mobility with RW and Hx of falls  POSTURE COMMENTS:  rounded shoulders and forward head  FUNCTIONAL OUTCOME MEASURES: Physical performance test: PPT #4 donning/doffing jacket: unable to complete due to LOB* 5 time sit > stand:  1:05.94 with up to mod assist and cues to scoot forward towards edge of chair and needing to push up from RW.  UPPER EXTREMITY ROM:  all movements are slowed, decreased shoulder flexion d/t ankylosing spondylitis  Active ROM Right eval Left eval  Shoulder flexion 90 90  Shoulder abduction    Shoulder adduction    Shoulder extension    Shoulder internal rotation [facility]  Shoulder external rotation [facility]  Elbow flexion [facility] WFL  Elbow extension [facility] WFL  Wrist flexion    Wrist extension    Wrist ulnar deviation    Wrist radial deviation    Wrist pronation    Wrist supination    (Blank rows = not tested)  UPPER EXTREMITY MMT:     MMT Right eval Left eval  Shoulder flexion 4/5 4/5  Shoulder abduction    Shoulder adduction    Shoulder extension    Shoulder internal rotation    Shoulder external rotation    Middle trapezius    Lower trapezius    Elbow flexion 4/5 4/5  Elbow extension 4/5 4/5  Wrist flexion    Wrist extension    Wrist ulnar deviation    Wrist radial deviation    Wrist pronation    Wrist supination    (Blank rows = not tested)  HAND FUNCTION: TBD  COORDINATION: Slowed movements, TBA at future session  COGNITION: Overall cognitive status:  Pt's spouse reports that his thinking seems slower, but attributes it to stress from all issues presenting s/p fall in October  VISION: Subjective report: glasses broke with recent fall Baseline vision: Wears  glasses all the time Visual history: cataracts  VISION ASSESSMENT: Not tested  Patient has difficulty with following activities due to following visual impairments: currently difficult to assess as pt does not have glasses with him, as they broke during fall in October 2023.  OBSERVATIONS: Pt with slowed response time and movements during transfers to/from treatment space and during assessment.   TODAY'S TREATMENT:    08/21/22 Buttons: Pt reports difficulty with buttons sometimes, however today he did them without issue when getting dressed.  Pt demonstrating good technique when buttoning, OT still educating on button hook for times when buttons are more challenging.  OT providing hand over hand, demonstration, and verbal cues for technique with use of button hook. Pt may benefit from additional practice with button hook (as he was interested for more difficult days), however did well without it this  session.  LB dressing: simulate LB dressing with use of gait belt. Pt verbalizing good technique with dressing L side first.  Pt demonstrating increased difficulty when attempting to complete in simulated manner.  OT providing demonstration and verbal cues, pt then demonstrating increased understanding and demonstration of LB dressing.  Wife reports that pt sometimes attempts donning pants in standing, educated on fall risk and recommendation to complete at sit > stand level at this time. Bed mobility: Pt demonstrating good technique with getting in/out of bed from mat table, however reports still having difficulty with getting tangled up in sheets.  Discussed untucking foot of bed and/or being more cognizant of blanket placement prior to exiting/entering bed to decrease getting tangled up.    08/14/22 5x sit > stand: 43.94 seconds.  Pt continues to demonstrate posterior bias, impacting ability to stand fully uprights on first attempt each time. Pt is able to complete sit > stand without use of UE  and no longer pulling up on RW!  OT providing min cues for anterior weight shift and scooting forward prior to standing. ADL: Pt reports still needing assistance with getting shirt on sometimes, however spouse reports noticing significant improvements.  Wife reports that sometimes shirt is still getting caught on shoulders.  Engaged in simulated UB dressing with hospital gown.  Pt demonstrating difficulty getting second sleeve around body during first attempt, however with improvements on second attempt.  OT providing min cues and assistance for donning simulated button up shirt.  OT providing demonstration and verbal cues for large amplitude movement and cues for hand placement to increase success with doffing shirt.  Discussed and demonstrated hand placement to increase ease with donning pull over shirt.   Transitional movements: pt demonstrating improved sequencing and RW placement during stand pivot transfers mat <> chair. Dynamic standing:  Pt tolerated standing 5 mins during pipe tree puzzle to challenge use of BUE during standing activity.  Pt with no LOB and good endurance during table top tasks.  Pt did require mod cues for sequencing of tasks, discussed other puzzle activities to challenge cognition.   08/12/22 DME/AE: engaged in discussion, showing pictures and options for shower seat.  Pt's spouse feels that pt would benefit from seat with B arm rests, therefore showed options. Simulated shower transfers: engaged in sidestepping over 6" hurdle to simulate sidestepping in/out of walk-in shower.  OT set up shower seat to simulate home setup.  Pt requiring CGA and max multimodal cues for sequencing.  Pt demonstrating decreased step length, inconsistent hand placement, and decreased sequencing requiring massed practice and demonstration, hand over hand, and verbal cues for sequencing.  Discussed sidestepping in/out of shower and option of sidestepping in and then stepping forward out of shower.  Pt  will continue to benefit from practice in clinic prior to attempting in dry shower at home.     PATIENT EDUCATION: Education details: ongoing condition specific education.   Person educated: Patient and Spouse Education method: Explanation, Demonstration, and Verbal cues Education comprehension: verbalized understanding and needs further education  HOME EXERCISE PROGRAM: TBD   GOALS: Goals reviewed with patient? Yes  SHORT TERM GOALS: Target date: 08/16/22  Pt and wife will report understanding of adaptive techniques and/or potential DME/AE needs to increase ease, safety, and independence w/ ADLs. Baseline: Goal status: MET - spouse looking in to shower chair options to return to bathing at shower level on 08/14/22  2.  Pt will perform dynamic standing task for 5 mins as needed for  simple IADLs w/o LOB using DME and/or countertop support prn  Baseline: decreased standing tolerance Goal status: MET - 08/14/22  3.  Pt will demonstrate improved ease with sit > stand to get up from toilet or other seating options in home by improving score on sit > stand to decreased fall risk. Baseline: 1:05.94 Goal status: MET - 43.94 sec on 08/14/22  4.  Pt will complete UB dressing (except clothing fasteners) with supervision. Baseline: wife assists with buttons occasionally prior to fall Goal status: NOT MET - wife still needing to assist some days, but not other days   LONG TERM GOALS: Target date: 09/13/22  Pt will demonstrate shower transfers with DME PRN at supervision level. Baseline: currently sponge bathing due to fearfulness of showering, but wants to return to showers.   Goal status: IN PROGRESS  2.  Pt will report increased participation in IADLs (such as cooking) to increase independence with functional IADLs.  Baseline:  Goal status: IN PROGRESS  3.  Pt will complete UB dressing, to include clothing fasteners, at Mod I level with use of AE and/or alternative strategies  PRN. Baseline:  Goal status: IN PROGRESS  4.  Pt will complete LB dressing with Supervision/setup with use of AE and/or alternative strategies PRN. Baseline:  Goal status: IN PROGRESS  5.  Pt will demonstrate improved dynamic standing balance for 15 mins as needed to engage in ADLs/IADLs with increased safety/endurance.   Baseline:  Goal status: IN PROGRESS  6. Pt will demonstrate increased ease with dressing as evidenced by decreasing PPT#4(don/ doff jacket) by 10 secs or more.  Baseline: TBD  Goal status: IN PROGRESS  ASSESSMENT:  CLINICAL IMPRESSION: Pt requiring hand over hand, demonstration, and verbal cues for novel tasks this session, benefiting from massed practice for carryover.  Pt demonstrating improvements with supine <> sit on mat table, however still requiring cues for problem solving not getting caught in blankets in bed.  PERFORMANCE DEFICITS: in functional skills including ADLs, IADLs, ROM, strength, pain, flexibility, Fine motor control, Gross motor control, mobility, balance, body mechanics, endurance, decreased knowledge of precautions, decreased knowledge of use of DME, and UE functional use, cognitive skills including attention, memory, safety awareness, sequencing, and thought, and psychosocial skills including environmental adaptation and routines and behaviors.   IMPAIRMENTS: are limiting patient from ADLs and IADLs.   CO-MORBIDITIES: may have co-morbidities  that affects occupational performance. Patient will benefit from skilled OT to address above impairments and improve overall function.  MODIFICATION OR ASSISTANCE TO COMPLETE EVALUATION: Min-Moderate modification of tasks or assist with assess necessary to complete an evaluation.  OT OCCUPATIONAL PROFILE AND HISTORY: Detailed assessment: Review of records and additional review of physical, cognitive, psychosocial history related to current functional performance.  CLINICAL DECISION MAKING: Moderate -  several treatment options, min-mod task modification necessary  REHAB POTENTIAL: Good  EVALUATION COMPLEXITY: Moderate    PLAN:  OT FREQUENCY: 2x/week  OT DURATION: 8 weeks  PLANNED INTERVENTIONS: self care/ADL training, therapeutic exercise, therapeutic activity, neuromuscular re-education, manual therapy, balance training, functional mobility training, ultrasound, biofeedback, compression bandaging, moist heat, cryotherapy, patient/family education, cognitive remediation/compensation, visual/perceptual remediation/compensation, psychosocial skills training, energy conservation, coping strategies training, and DME and/or AE instructions  RECOMMENDED OTHER SERVICES: N/A  CONSULTED AND AGREED WITH PLAN OF CARE: Patient and family member/caregiver  PLAN FOR NEXT SESSION:  BUE ROM for dressing with focus on large amplitude and posture; Reiterate DME/AE for bathroom transfers and safety/independence with ADLs/IADLs. Practice shower transfers with stepping over shower  ledge and continue to discuss DME/AE for bathroom transfers and dressing tasks.  Dynamic standing balance/endurance as well as energy conservation strategies.  Pt to report more concerning/problem areas to focus remaining sessions on.   Simonne Come, OTR/L 08/21/2022, 3:01 PM

## 2022-08-21 NOTE — Therapy (Signed)
OUTPATIENT PHYSICAL THERAPY NEURO TREATMENT   Patient Name: [name] MRN: [id number] DOB:[birth year], 86 y.o., male Today's Date: 08/21/2022   PCP: Donnajean Lopes, MD REFERRING PROVIDER: Star Age, MD  END OF SESSION:  PT End of Session - 08/21/22 1449     Visit Number 9    Number of Visits 16    Date for PT Re-Evaluation 09/10/22    Authorization Type Medicare    Progress Note Due on Visit 10    PT Start Time 1445    PT Stop Time 8250    PT Time Calculation (min) 45 min    Equipment Utilized During Treatment Gait belt    Activity Tolerance Patient tolerated treatment well    Behavior During Therapy WFL for tasks assessed/performed              Past Medical History:  Diagnosis Date   Ankylosing spondylitis ([place]) dx'd ~ 1974   Arthritis    "back" (08/10/2015)   BENIGN PROSTATIC HYPERTROPHY, WITH OBSTRUCTION 01/15/2010   Bleeding duodenal ulcer    Bleeding esophageal ulcer    Bleeding stomach ulcer    GERD 02/22/2008   History of blood transfusion 1988   "lost ~ 1/2 of my blood volume from multiple bleeding ulcers"   History of hiatal hernia    HYPERGLYCEMIA 11/18/2007   HYPERLIPIDEMIA 02/22/2008   HYPERTENSION, UNSPECIFIED 11/10/2009   Kidney stones    Osteoarthritis    c-spine   PEPTIC ULCER DISEASE 11/17/2007   Situational depression    "son died in [place] 07-05-15"   TIA (transient ischemic attack)    "not that I know of in the past; they are trying to determine if I've had one today (08/10/2015)"   Past Surgical History:  Procedure Laterality Date   CATARACT EXTRACTION W/ INTRAOCULAR LENS  IMPLANT, BILATERAL Bilateral 2013   Patient Active Problem List   Diagnosis Date Noted   Ankylosing spondylitis of cervicothoracic region ([place]) 10/16/2016   TIA (transient ischemic attack) 08/10/2015   Carotid stenosis 08/04/2014   Hyperlipidemia 02/21/2012   BENIGN PROSTATIC HYPERTROPHY, WITH OBSTRUCTION 01/15/2010   Essential hypertension 11/10/2009   GERD  02/22/2008   NEPHROLITHIASIS, HX OF 11/17/2007    ONSET DATE: 04-Jul-2022, PD for 4 years  REFERRING DIAG: Z91.81 (ICD-10-CM) - History of recent fall G20.C (ICD-10-CM) - Primary parkinsonism  THERAPY DIAG:  Muscle weakness (generalized)  Unsteadiness on feet  Abnormal posture  Difficulty in walking, not elsewhere classified  Other abnormalities of gait and mobility  Rationale for Evaluation and Treatment: Rehabilitation  SUBJECTIVE:  SUBJECTIVE STATEMENT:  Did well navigating the steps at recent event  Pt accompanied by: significant other  PERTINENT HISTORY: PD, complex medical history of peptic ulcer disease with history of upper GI bleed, TIA, depression, degenerative disc disease in the neck, osteoarthritis, kidney stones, hyperlipidemia, hypertension, history of hiatal hernia, BPH, arthritis, and ankylosing spondylitis, and parkinsonism  PAIN:  Are you having pain? No, none more than usual  PRECAUTIONS: None  WEIGHT BEARING RESTRICTIONS: No  FALLS: Has patient fallen in last 6 months? Yes. Number of falls "several"  LIVING ENVIRONMENT: Lives with: lives with their spouse Lives in: House/apartment Stairs:  stairs inside home to loft, ramp to enter/exit home Has following equipment at home: Gilford Rile - 2 wheeled, since the fall in October  PLOF: Lily: return to PLOF  OBJECTIVE:   TODAY'S TREATMENT: 08/21/22 Activity Comments  PRW moves supine -step -twist  Seated cable row 1x10 10#, 15#, 20#  Sidestepping around mat table Cues for maintaining hip position for abduction  Step ups 8" box Lateral with vertical assist rail to mimic stepping on truck siderail  Sit to stand to PF 5x CGA for sequence        TODAY'S TREATMENT: 08/14/22 Activity Comments   NU-step x 8 min For fast, alternating movements  Standing cable row 20# 3x12 reps, floor dots for foot placement  Gait training -assisted reciprocal arm swing -gait with large arm swing -attempted UE/LE large amplitude -retrowalk 2x25 ft -sidestep 2x25 ft  Push-release for righting reactions Ant, post, lateral           TODAY'S TREATMENT: 08/12/22 Activity Comments  Seated rows with bar 3x12 reps 15-25#  PWR! Up between rows Cues for sequence and amplitude and verbal counting  Standing single arm row 2x10 15# for arm strength/posture correct  Sidestep x 60 sec Along counter, tactile cues for hip alignment  Retrowalk x 60 sec  Along counter, cues for step length  March x 2 min Along counter, cues for step height  Foot on step Elevated on aerobics step and climbing finger ladder  Gait training -use of visual cues for large step length and negotiating turns with larger/continuous steps x 3 min -retrowalking x 3 min cues for large step for hip extension  Stair ambulation SBA-CGA, cues for step-to pattern when descending due to right ankle deficits          HOME EXERCISE PROGRAM Last updated: 07/24/22 Access Code: [email or web address] URL: [email or web address] Date: 07/24/2022 Prepared by: [facility]  Program Notes perform with gait belt and wife's supervision  Exercises - Standing Balance in Corner  - 1 x daily - 7 x weekly - 3 sets - 30 sec hold - Narrow Stance with Counter Support  - 1 x daily - 5 x weekly - 2 sets - 10 reps - 30 sec hold - Standing Balance in Corner with Eyes Closed  - 1 x daily - 5 x weekly - 2 sets - 10 reps - 30 sec hold - Sit to Stand with Counter Support  - 1 x daily - 5 x weekly - 2 sets - 5-10 reps - Supine Lower Trunk Rotation  - 1 x daily - 7 x weekly - 3 sets - 10 reps - Supine Bridge  - 1 x daily - 7 x weekly - 3 sets - 10 reps - Thoracic Extension Mobilization on Foam Roll  - 1 x daily - 7 x weekly -  3  sets - 10 reps - 3 sec hold - Seated Shoulder Horizontal Abduction with Resistance  - 1 x daily - 7 x weekly - 3 sets - 10 reps    PATIENT EDUCATION: Education details: review/clarification and update to HEP- to be performed with gait belt, wife's supervision, walker in front, and wall behind for max safety Person educated: Patient and Spouse Education method: Explanation, Demonstration, Tactile cues, Verbal cues, and Handouts Education comprehension: verbalized understanding and returned demonstration    Below measures were taken at time of initial evaluation unless otherwise specified:   DIAGNOSTIC FINDINGS: no intracranial or osseous injuries  COGNITION: Overall cognitive status: Within functional limits for tasks assessed   SENSATION: WFL  COORDINATION: Alternating movements impaired, right ankle limitations pre-morbid from hx of crush injury in childhood Heel to shin mildly impaired right > left Finger to nose WNL Finger opposition: unable to perform bilaterally  EDEMA:  none  MUSCLE TONE: NT  MUSCLE LENGTH: Presents with bilateral knee flexion contractures, approx 5-10 degrees  DTRs:    POSTURE: rounded shoulders, forward head, and increased thoracic kyphosis  LOWER EXTREMITY ROM:     Active  Right Eval Left Eval  Hip flexion    Hip extension    Hip abduction    Hip adduction    Hip internal rotation    Hip external rotation    Knee flexion    Knee extension -10 -10  Ankle dorsiflexion 5 10  Ankle plantarflexion    Ankle inversion    Ankle eversion     (Blank rows = not tested)  LOWER EXTREMITY MMT:  assessed in sitting  MMT Right Eval Left Eval  Hip flexion 4 4  Hip extension    Hip abduction 3+ 3+  Hip adduction 4- 4-  Hip internal rotation    Hip external rotation    Knee flexion 4 4  Knee extension 4- 4-  Ankle dorsiflexion 2+ 3+  Ankle plantarflexion    Ankle inversion    Ankle eversion    (Blank rows = not tested)  BED  MOBILITY:  Mod A  TRANSFERS: Assistive device utilized: Environmental consultant - 2 wheeled  Sit to stand: CGA and Min A Stand to sit: SBA Chair to chair: CGA and Min A Floor:  DNT  RAMP:  NT  CURB:  Level of Assistance: CGA Assistive device utilized: Environmental consultant - 2 wheeled Curb Comments:   STAIRS: NT  GAIT: Gait pattern:  right knee extensor thrust in stance, shuffling, and trunk flexed, deficits during turning Distance walked:  Assistive device utilized: Environmental consultant - 2 wheeled Level of assistance: CGA and Min A Comments: unsteady, deficits during turns  FUNCTIONAL TESTS:  5 times sit to stand: unable to complete, retro-pulsion Timed up and go (TUG): 29 sec Push and release test (# of steps): Anterior: absent  Posterior: absent  M-CTSIB  Condition 1: Firm Surface, EO 30 Sec, Mild Sway  Condition 2: Firm Surface, EC 30 Sec, Mild and Moderate Sway  Condition 3: Foam Surface, EO 0 Sec,  unable  Sway  Condition 4: Foam Surface, EC - Sec,  -  Sway     VESTIBULAR ASSESSMENT   GENERAL OBSERVATION: right eyebrow ptosis?    SYMPTOM BEHAVIOR:   Subjective history: fall and strike to anterior right forehead   Non-Vestibular symptoms: denies   Type of dizziness: Imbalance (Disequilibrium) and Lightheadedness/Faint   Frequency: "depends"   Duration: "whenever"   Aggravating factors: No known aggravating factors   Relieving factors: no known  relieving factors   Progression of symptoms: unchanged   OCULOMOTOR EXAM:   Ocular Alignment: right eye ptosis   Ocular ROM: No Limitations   Spontaneous Nystagmus: absent   Gaze-Induced Nystagmus: age appropriate nystagmus at end range   Smooth Pursuits: saccades   Saccades: hypometric/undershoots, extra eye movements, and slow   Convergence/Divergence: 10 inches      VESTIBULAR - OCULAR REFLEX:    Slow VOR: Comment: difficulty in tracking   VOR Cancellation: Comment: NT   Head-Impulse Test: NT   Dynamic Visual Acuity: Not able to be  assessed    POSITIONAL TESTING: unable to test due to time limitation    MOTION SENSITIVITY:    Motion Sensitivity Quotient  Intensity: 0 = none, 1 = Lightheaded, 2 = Mild, 3 = Moderate, 4 = Severe, 5 = Vomiting  Intensity  1. Sitting to supine   2. Supine to L side   3. Supine to R side   4. Supine to sitting   5. L Hallpike-Dix   6. Up from L    7. R Hallpike-Dix   8. Up from R    9. Sitting, head  tipped to L knee   10. Head up from L  knee   11. Sitting, head  tipped to R knee   12. Head up from R  knee   13. Sitting head turns x5   14.Sitting head nods x5   15. In stance, 180  turn to L    16. In stance, 180  turn to R     OTHOSTATICS: not done  FUNCTIONAL GAIT:    VESTIBULAR TREATMENT:  Canalith Repositioning:   TBD Gaze Adaptation:   TBD Habituation:   TBD Other:  PATIENT SURVEYS:    PATIENT EDUCATION: Education details: assessment findings/interpretations Person educated: Patient and Spouse Education method: Explanation Education comprehension: verbalized understanding   GOALS: Goals reviewed with patient? Yes  SHORT TERM GOALS: Target date: 08/13/2022    Patient will perform HEP with family/caregiver supervision for improved strength, balance, transfers, gaze adaptation, habituation, balance, and gait  Baseline: Goal status: IN PROGRESS  2.  Demonstrate functional transfers with supervision level with or without AD to improve safety with functional mobility Baseline: CGA-min A Goal status: IN PROGRESS  3.  Manifest improved BLE strength as evidenced by ability to complete 5xSTS test in 25 sec to improve safety with mobility Baseline: unable Goal status: IN PROGRESS   LONG TERM GOALS: Target date: 09/10/2022    Demonstrate reduce risk for falls per time 15 sec TUG test with least restrictive AD Baseline: 29 sec w/ RW Goal status: IN PROGRESS  2.  Demo ability to transfer and ambulate over various surfaces x 300 ft with  modified independence to reduce level of assistance from caregiver Baseline: CGA-min A w/ RW Goal status: IN PROGRESS  3.  Demonstrate improved reactive balance as evidenced by ability to enact 2-3 steps with push-release test to facilitate righting reactions and reduce risk for falls Baseline: Absent, LOB Goal status: IN PROGRESS   ASSESSMENT:  CLINICAL IMPRESSION: Continued wth activities to promote increase amplitude of movement and compound movements to improve coordination and recruitment. Frequent tactile and visual cues for sequence throughout activities. Improving in strength and speed of sit to stand transfers relying less on UE assist. Continued sessions to progress mobility, balance, and coordination to enhance mobility and reduce risk fro falls  OBJECTIVE IMPAIRMENTS: Abnormal gait, decreased activity tolerance, decreased balance, decreased coordination, decreased knowledge of use  of DME, difficulty walking, decreased ROM, decreased strength, dizziness, impaired flexibility, improper body mechanics, and postural dysfunction.   ACTIVITY LIMITATIONS: carrying, lifting, bending, standing, squatting, stairs, transfers, bed mobility, bathing, dressing, reach over head, and locomotion level  PARTICIPATION LIMITATIONS: meal prep, cleaning, laundry, shopping, community activity, and yard work  PERSONAL FACTORS: Age, Time since onset of injury/illness/exacerbation, and 3+ comorbidities: see medical hx  are also affecting patient's functional outcome.   REHAB POTENTIAL: Good  CLINICAL DECISION MAKING: Evolving/moderate complexity  EVALUATION COMPLEXITY: Moderate  PLAN:  PT FREQUENCY: 2x/week  PT DURATION: 8 weeks  PLANNED INTERVENTIONS: Therapeutic exercises, Therapeutic activity, Neuromuscular re-education, Balance training, Gait training, Patient/Family education, Self Care, Joint mobilization, Stair training, Vestibular training, Canalith repositioning, DME instructions,  Aquatic Therapy, Dry Needling, Electrical stimulation, Wheelchair mobility training, Spinal mobilization, Cryotherapy, Moist heat, Taping, and Manual therapy  PLAN FOR NEXT SESSION: Practice steps!    2:50 PM, 08/21/22 M. Sherlyn Lees, PT, DPT Physical Therapist- [place] Office Number: [phone]   [place] at [facility] [address] [place], [place] [address] Phone # [phone] Fax # [phone]

## 2022-08-22 NOTE — Therapy (Signed)
OUTPATIENT PHYSICAL THERAPY NEURO PROGRESS NOTE   Patient Name: [name] MRN: [id number] DOB:[birth year], 86 y.o., male Today's Date: 08/23/2022   PCP: Donnajean Lopes, MD REFERRING PROVIDER: Star Age, MD   Progress Note Reporting Period 07/16/22 to 08/23/22  See note below for Objective Data and Assessment of Progress/Goals.     END OF SESSION:  PT End of Session - 08/23/22 1153     Visit Number 10    Number of Visits 16    Date for PT Re-Evaluation 09/10/22    Authorization Type Medicare    Progress Note Due on Visit 20    PT Start Time 1023   pt in bathroom   PT Stop Time 1054    PT Time Calculation (min) 31 min    Equipment Utilized During Treatment Gait belt    Activity Tolerance Patient tolerated treatment well    Behavior During Therapy WFL for tasks assessed/performed               Past Medical History:  Diagnosis Date   Ankylosing spondylitis ([place]) dx'd ~ 1974   Arthritis    "back" (08/10/2015)   BENIGN PROSTATIC HYPERTROPHY, WITH OBSTRUCTION 01/15/2010   Bleeding duodenal ulcer    Bleeding esophageal ulcer    Bleeding stomach ulcer    GERD 02/22/2008   History of blood transfusion 1988   "lost ~ 1/2 of my blood volume from multiple bleeding ulcers"   History of hiatal hernia    HYPERGLYCEMIA 11/18/2007   HYPERLIPIDEMIA 02/22/2008   HYPERTENSION, UNSPECIFIED 11/10/2009   Kidney stones    Osteoarthritis    c-spine   PEPTIC ULCER DISEASE 11/17/2007   Situational depression    "son died in [place] 2015/06/26"   TIA (transient ischemic attack)    "not that I know of in the past; they are trying to determine if I've had one today (08/10/2015)"   Past Surgical History:  Procedure Laterality Date   CATARACT EXTRACTION W/ INTRAOCULAR LENS  IMPLANT, BILATERAL Bilateral 2013   Patient Active Problem List   Diagnosis Date Noted   Ankylosing spondylitis of cervicothoracic region ([place]) 10/16/2016   TIA (transient ischemic attack) 08/10/2015    Carotid stenosis 08/04/2014   Hyperlipidemia 02/21/2012   BENIGN PROSTATIC HYPERTROPHY, WITH OBSTRUCTION 01/15/2010   Essential hypertension 11/10/2009   GERD 02/22/2008   NEPHROLITHIASIS, HX OF 11/17/2007    ONSET DATE: Jun 25, 2022, PD for 4 years  REFERRING DIAG: Z91.81 (ICD-10-CM) - History of recent fall G20.C (ICD-10-CM) - Primary parkinsonism  THERAPY DIAG:  Muscle weakness (generalized)  Unsteadiness on feet  Abnormal posture  Difficulty in walking, not elsewhere classified  Other abnormalities of gait and mobility  Other symptoms and signs involving the nervous system  Rationale for Evaluation and Treatment: Rehabilitation  SUBJECTIVE:  SUBJECTIVE STATEMENT: Wife reports "he has gotten a lot better" but today is moving slow.  Patient reports working on HEP "some" d/t being busy. Denies questions/concerns on HEP. Reports that he would like to return to driving. Reports that he has not been doing the grocery shopping since [place]- his wife has taken over that errand.   Pt accompanied by: significant other  PERTINENT HISTORY: PD, complex medical history of peptic ulcer disease with history of upper GI bleed, TIA, depression, degenerative disc disease in the neck, osteoarthritis, kidney stones, hyperlipidemia, hypertension, history of hiatal hernia, BPH, arthritis, and ankylosing spondylitis, and parkinsonism  PAIN:  Are you having pain? No PRECAUTIONS: None  WEIGHT BEARING RESTRICTIONS: No  FALLS: Has patient fallen in last 6 months? Yes. Number of falls "several"  LIVING ENVIRONMENT: Lives with: lives with their spouse Lives in: House/apartment Stairs:  stairs inside home to loft, ramp to enter/exit home Has following equipment at home: Gilford Rile - 2 wheeled, since the fall in  October  PLOF: [place]: return to PLOF  OBJECTIVE:     TODAY'S TREATMENT: 08/23/22 Activity Comments  Transfers into/out of chair with RW Required cues to keep walker with him until ready to sit and to reach back for seat before sitting down  5xSTS 20.51 sec with B armrest; posterior LOB on last rep  TUG  31.11 sec with RW; small freezing episode upon turning   Gait training with RW 323 feet with RW completed in 4 min  13 sec; reported pretty mild fatigue; R foot slap and shortened L step length   Push and release in all directions    Multiple steps required to regain balance; required min A in backwards direction; unable to take a step to the L        PATIENT EDUCATION: Education details: discussion on progress towards goals and remaining impairments  Person educated: Patient Education method: Explanation, Demonstration, Tactile cues, and Verbal cues Education comprehension: verbalized understanding    HOME EXERCISE PROGRAM Last updated: 07/24/22 Access Code: [email or web address] URL: [email or web address] Date: 07/24/2022 Prepared by: [facility]  Program Notes perform with gait belt and wife's supervision  Exercises - Standing Balance in Corner  - 1 x daily - 7 x weekly - 3 sets - 30 sec hold - Narrow Stance with Counter Support  - 1 x daily - 5 x weekly - 2 sets - 10 reps - 30 sec hold - Standing Balance in Corner with Eyes Closed  - 1 x daily - 5 x weekly - 2 sets - 10 reps - 30 sec hold - Sit to Stand with Counter Support  - 1 x daily - 5 x weekly - 2 sets - 5-10 reps - Supine Lower Trunk Rotation  - 1 x daily - 7 x weekly - 3 sets - 10 reps - Supine Bridge  - 1 x daily - 7 x weekly - 3 sets - 10 reps - Thoracic Extension Mobilization on Foam Roll  - 1 x daily - 7 x weekly - 3 sets - 10 reps - 3 sec hold - Seated Shoulder Horizontal Abduction with Resistance  - 1 x daily - 7 x weekly - 3 sets - 10  reps   Below measures were taken at time of initial evaluation unless otherwise specified:   DIAGNOSTIC FINDINGS: no intracranial or osseous injuries  COGNITION: Overall cognitive status: Within functional limits for tasks assessed   SENSATION: WFL  COORDINATION:  Alternating movements impaired, right ankle limitations pre-morbid from hx of crush injury in childhood Heel to shin mildly impaired right > left Finger to nose WNL Finger opposition: unable to perform bilaterally  EDEMA:  none  MUSCLE TONE: NT  MUSCLE LENGTH: Presents with bilateral knee flexion contractures, approx 5-10 degrees  DTRs:    POSTURE: rounded shoulders, forward head, and increased thoracic kyphosis  LOWER EXTREMITY ROM:     Active  Right Eval Left Eval  Hip flexion    Hip extension    Hip abduction    Hip adduction    Hip internal rotation    Hip external rotation    Knee flexion    Knee extension -10 -10  Ankle dorsiflexion 5 10  Ankle plantarflexion    Ankle inversion    Ankle eversion     (Blank rows = not tested)  LOWER EXTREMITY MMT:  assessed in sitting  MMT Right Eval Left Eval  Hip flexion 4 4  Hip extension    Hip abduction 3+ 3+  Hip adduction 4- 4-  Hip internal rotation    Hip external rotation    Knee flexion 4 4  Knee extension 4- 4-  Ankle dorsiflexion 2+ 3+  Ankle plantarflexion    Ankle inversion    Ankle eversion    (Blank rows = not tested)  BED MOBILITY:  Mod A  TRANSFERS: Assistive device utilized: Environmental consultant - 2 wheeled  Sit to stand: CGA and Min A Stand to sit: SBA Chair to chair: CGA and Min A Floor:  DNT  RAMP:  NT  CURB:  Level of Assistance: CGA Assistive device utilized: Environmental consultant - 2 wheeled Curb Comments:   STAIRS: NT  GAIT: Gait pattern:  right knee extensor thrust in stance, shuffling, and trunk flexed, deficits during turning Distance walked:  Assistive device utilized: Environmental consultant - 2 wheeled Level of assistance: CGA and Min  A Comments: unsteady, deficits during turns  FUNCTIONAL TESTS:  5 times sit to stand: unable to complete, retro-pulsion Timed up and go (TUG): 29 sec Push and release test (# of steps): Anterior: absent  Posterior: absent  M-CTSIB  Condition 1: Firm Surface, EO 30 Sec, Mild Sway  Condition 2: Firm Surface, EC 30 Sec, Mild and Moderate Sway  Condition 3: Foam Surface, EO 0 Sec,  unable  Sway  Condition 4: Foam Surface, EC - Sec,  -  Sway     VESTIBULAR ASSESSMENT   GENERAL OBSERVATION: right eyebrow ptosis?    SYMPTOM BEHAVIOR:   Subjective history: fall and strike to anterior right forehead   Non-Vestibular symptoms: denies   Type of dizziness: Imbalance (Disequilibrium) and Lightheadedness/Faint   Frequency: "depends"   Duration: "whenever"   Aggravating factors: No known aggravating factors   Relieving factors: no known relieving factors   Progression of symptoms: unchanged   OCULOMOTOR EXAM:   Ocular Alignment: right eye ptosis   Ocular ROM: No Limitations   Spontaneous Nystagmus: absent   Gaze-Induced Nystagmus: age appropriate nystagmus at end range   Smooth Pursuits: saccades   Saccades: hypometric/undershoots, extra eye movements, and slow   Convergence/Divergence: 10 inches      VESTIBULAR - OCULAR REFLEX:    Slow VOR: Comment: difficulty in tracking   VOR Cancellation: Comment: NT   Head-Impulse Test: NT   Dynamic Visual Acuity: Not able to be assessed    POSITIONAL TESTING: unable to test due to time limitation    MOTION SENSITIVITY:    Motion Sensitivity Quotient  Intensity: 0 = none, 1 = Lightheaded, 2 = Mild, 3 = Moderate, 4 = Severe, 5 = Vomiting  Intensity  1. Sitting to supine   2. Supine to L side   3. Supine to R side   4. Supine to sitting   5. L Hallpike-Dix   6. Up from L    7. R Hallpike-Dix   8. Up from R    9. Sitting, head  tipped to L knee   10. Head up from L  knee   11. Sitting, head  tipped to R knee   12. Head up from R   knee   13. Sitting head turns x5   14.Sitting head nods x5   15. In stance, 180  turn to L    16. In stance, 180  turn to R     OTHOSTATICS: not done  FUNCTIONAL GAIT:    VESTIBULAR TREATMENT:  Canalith Repositioning:   TBD Gaze Adaptation:   TBD Habituation:   TBD Other:  PATIENT SURVEYS:    PATIENT EDUCATION: Education details: assessment findings/interpretations Person educated: Patient and Spouse Education method: Explanation Education comprehension: verbalized understanding   GOALS: Goals reviewed with patient? Yes  SHORT TERM GOALS: Target date: 08/13/2022    Patient will perform HEP with family/caregiver supervision for improved strength, balance, transfers, gaze adaptation, habituation, balance, and gait  Baseline: Goal status: MET   2.  Demonstrate functional transfers with supervision level with or without AD to improve safety with functional mobility Baseline: CGA-min A; supervision/verbal cueing 08/23/22 Goal status: MET 08/23/22  3.  Manifest improved BLE strength as evidenced by ability to complete 5xSTS test in 25 sec to improve safety with mobility Baseline: unable; 20.51 sec 08/23/22 Goal status: MET 08/23/22   LONG TERM GOALS: Target date: 09/10/2022    Demonstrate reduce risk for falls per time 15 sec TUG test with least restrictive AD Baseline: 29 sec w/ RW; 31.11 sec with RW 08/23/22 Goal status: IN PROGRESS 08/23/22  2.  Demo ability to transfer and ambulate over various surfaces x 300 ft with modified independence to reduce level of assistance from caregiver Baseline: CGA-min A w/ RW; completed with CGA 08/23/22 Goal status: MET 08/23/22  3.  Demonstrate improved reactive balance as evidenced by ability to enact 2-3 steps with push-release test to facilitate righting reactions and reduce risk for falls Baseline: Absent, LOB; multiple steps required- min A in posterior direction, unable to the L Goal status: IN PROGRESS 08/23/22     ASSESSMENT:  CLINICAL IMPRESSION: Patient arrived to session with wife who reports that the patient is moving slowly today. Patient denies questions/concerns on HEP. Reports that he would like to return to driving. Patient able to perform stand pivot transfers with RW with supervision/verbal cueing for safety. Score on 5xSTS has improved since initial assessment. Still demonstrating decreased gait speed with TUG testing today. Able to walk 347f of with RW today with minimal fatigue. Push and release test revealed considerable difficulty regaining balance in all directions. At this time patient has met several goals. Would benefit from additional skilled PT services to address gait speed and balance recovery.   OBJECTIVE IMPAIRMENTS: Abnormal gait, decreased activity tolerance, decreased balance, decreased coordination, decreased knowledge of use of DME, difficulty walking, decreased ROM, decreased strength, dizziness, impaired flexibility, improper body mechanics, and postural dysfunction.   ACTIVITY LIMITATIONS: carrying, lifting, bending, standing, squatting, stairs, transfers, bed mobility, bathing, dressing, reach over head, and locomotion level  PARTICIPATION LIMITATIONS: meal prep,  cleaning, laundry, shopping, community activity, and yard work  PERSONAL FACTORS: Age, Time since onset of injury/illness/exacerbation, and 3+ comorbidities: see medical hx  are also affecting patient's functional outcome.   REHAB POTENTIAL: Good  CLINICAL DECISION MAKING: Evolving/moderate complexity  EVALUATION COMPLEXITY: Moderate  PLAN:  PT FREQUENCY: 2x/week  PT DURATION: 8 weeks  PLANNED INTERVENTIONS: Therapeutic exercises, Therapeutic activity, Neuromuscular re-education, Balance training, Gait training, Patient/Family education, Self Care, Joint mobilization, Stair training, Vestibular training, Canalith repositioning, DME instructions, Aquatic Therapy, Dry Needling, Electrical stimulation,  Wheelchair mobility training, Spinal mobilization, Cryotherapy, Moist heat, Taping, and Manual therapy  PLAN FOR NEXT SESSION: Practice steps; work on improving gait speed and balance recovery    Janene Harvey, PT, DPT 08/23/22 11:56 AM  [facility] [address] [place], [place] [address] Phone # [phone] Fax # [phone]

## 2022-08-23 ENCOUNTER — Ambulatory Visit: Payer: Medicare Other | Admitting: Occupational Therapy

## 2022-08-23 ENCOUNTER — Ambulatory Visit: Payer: Medicare Other | Admitting: Physical Therapy

## 2022-08-23 ENCOUNTER — Encounter: Payer: Self-pay | Admitting: Physical Therapy

## 2022-08-23 DIAGNOSIS — R29818 Other symptoms and signs involving the nervous system: Secondary | ICD-10-CM | POA: Diagnosis not present

## 2022-08-23 DIAGNOSIS — R2681 Unsteadiness on feet: Secondary | ICD-10-CM

## 2022-08-23 DIAGNOSIS — M6281 Muscle weakness (generalized): Secondary | ICD-10-CM

## 2022-08-23 DIAGNOSIS — R2689 Other abnormalities of gait and mobility: Secondary | ICD-10-CM | POA: Diagnosis not present

## 2022-08-23 DIAGNOSIS — R293 Abnormal posture: Secondary | ICD-10-CM

## 2022-08-23 DIAGNOSIS — R262 Difficulty in walking, not elsewhere classified: Secondary | ICD-10-CM

## 2022-08-23 NOTE — Therapy (Signed)
OUTPATIENT OCCUPATIONAL THERAPY Treatment Session & Progress Note  Patient Name: BIRD SWETZ MRN: [id number] DOB:1935/10/15, 86 y.o., male Today's Date: 08/23/2022  PCP: Donnajean Lopes, MD REFERRING PROVIDER: Star Age, MD  Occupational Therapy Progress Note  Dates of Reporting Period: 07/16/22 to 08/23/22  Objective Reports of Subjective Statement: Pt is demonstrating improvements in sit > stand, with decreased reliance on RW with transitional movements.  Pt is demonstrating carryover of education on RW placement during transfers, especially when approaching chair or mat table.  Pt continues to demonstrate decreased speed and technique with UB and LB dressing, however is demonstrating min improvements with modifications to technique.  Pt will benefit from repetition in therapy sessions as well as at home.  Reason Skilled Services are Required: Pt continues to demonstrate decreased coordination and motor control impacting ease with bed mobility, UB and LB dressing, clothing fasteners, and bathroom transfers.  Pt will benefit from repetition in therapy sessions as well as at home.   END OF SESSION:  OT End of Session - 08/23/22 1103     Visit Number 10    Number of Visits 17    Date for OT Re-Evaluation 09/13/22    Authorization Type Medicare A &B    OT Start Time 16    OT Stop Time 3810    OT Time Calculation (min) 44 min                      Past Medical History:  Diagnosis Date   Ankylosing spondylitis ([facility]) dx'd ~ 1974   Arthritis    "back" (08/10/2015)   BENIGN PROSTATIC HYPERTROPHY, WITH OBSTRUCTION 01/15/2010   Bleeding duodenal ulcer    Bleeding esophageal ulcer    Bleeding stomach ulcer    GERD 02/22/2008   History of blood transfusion 1988   "lost ~ 1/2 of my blood volume from multiple bleeding ulcers"   History of hiatal hernia    HYPERGLYCEMIA 11/18/2007   HYPERLIPIDEMIA 02/22/2008   HYPERTENSION, UNSPECIFIED 11/10/2009   Kidney stones     Osteoarthritis    c-spine   PEPTIC ULCER DISEASE 11/17/2007   Situational depression    "son died in [place] 06-28-2015"   TIA (transient ischemic attack)    "not that I know of in the past; they are trying to determine if I've had one today (08/10/2015)"   Past Surgical History:  Procedure Laterality Date   CATARACT EXTRACTION W/ INTRAOCULAR LENS  IMPLANT, BILATERAL Bilateral 2013   Patient Active Problem List   Diagnosis Date Noted   Ankylosing spondylitis of cervicothoracic region ([place]) 10/16/2016   TIA (transient ischemic attack) 08/10/2015   Carotid stenosis 08/04/2014   Hyperlipidemia 02/21/2012   BENIGN PROSTATIC HYPERTROPHY, WITH OBSTRUCTION 01/15/2010   Essential hypertension 11/10/2009   GERD 02/22/2008   NEPHROLITHIASIS, HX OF 11/17/2007    ONSET DATE: referral date 07/04/22   REFERRING DIAG: G20.C (ICD-10-CM) - Parkinsonism, unspecified Z91.81 (ICD-10-CM) - History of falling  THERAPY DIAG:  Muscle weakness (generalized)  Other symptoms and signs involving the nervous system  Unsteadiness on feet  Abnormal posture  Rationale for Evaluation and Treatment: Rehabilitation  SUBJECTIVE:   SUBJECTIVE STATEMENT: Pt reports sometimes having to change pants due to difficulty with donning certain pants.  Pt accompanied by: self  PERTINENT HISTORY: history of Parkinson's and ankylosing spondylitis   PRECAUTIONS: Fall  WEIGHT BEARING RESTRICTIONS: No  PAIN:  Are you having pain? No  FALLS: Has patient fallen in last 6 months? Yes.  Number of falls 6 (3-4 since "big fall" in Oct)  LIVING ENVIRONMENT: Lives with: lives with their spouse Lives in: House/apartment Stairs: Yes: External: 2-3 steps; has had a ramp built just in last week Internal: steps to basement and steps to loft - however pt does not need to go up/down those at this time. Has following equipment at home: [employer] Rile - 2 wheeled, bed side commode, Ramped entry, and transport chair  PLOF:  Independent  PATIENT GOALS: to be able to feel confident in any sort of movement to resume prior activities  OBJECTIVE:   HAND DOMINANCE: Right  ADLs: Overall ADLs: Prior to fall in October 2023, pt Independent with all ADLs and IADLs without use of AE/DME, even completing shower in standing in walk-in shower. Transfers/ambulation related to ADLs: fluctuates with mobility; now using RW for all mobility, Supervision with all mobility Eating: Mod I Grooming: wife will assist with opening items; otherwise setup and supervision due to balance UB Dressing: Min-mod assist, buttons/zippers are quite challenging LB Dressing: Mod assist, is able to don socks/shoes with increased time Toileting: Max assist Bathing: sponge baths since fall, max assist from wife at this time Tub Shower transfers: has walk-in shower, but does not feel safe in shower since fall in Oct Equipment: bed side commode  IADLs: Meal Prep: since fall in October 2023, pt has been assisting with 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  Pt currently cutting foods for meal prep in sitting position. Medication management: wife assisting Handwriting:  TBA  MOBILITY STATUS: Needs Assist: CGA with mobility with RW and Hx of falls  POSTURE COMMENTS:  rounded shoulders and forward head  FUNCTIONAL OUTCOME MEASURES: Physical performance test: PPT #4 donning/doffing jacket: unable to complete due to LOB* 5 time sit > stand:  1:05.94 with up to mod assist and cues to scoot forward towards edge of chair and needing to push up from RW.  UPPER EXTREMITY ROM:  all movements are slowed, decreased shoulder flexion d/t ankylosing spondylitis  Active ROM Right eval Left eval  Shoulder flexion 90 90  Shoulder abduction    Shoulder adduction    Shoulder extension    Shoulder internal rotation [facility]  Shoulder external rotation [facility]  Elbow flexion [facility] WFL  Elbow extension [facility] WFL  Wrist flexion    Wrist extension    Wrist ulnar  deviation    Wrist radial deviation    Wrist pronation    Wrist supination    (Blank rows = not tested)  UPPER EXTREMITY MMT:     MMT Right eval Left eval  Shoulder flexion 4/5 4/5  Shoulder abduction    Shoulder adduction    Shoulder extension    Shoulder internal rotation    Shoulder external rotation    Middle trapezius    Lower trapezius    Elbow flexion 4/5 4/5  Elbow extension 4/5 4/5  Wrist flexion    Wrist extension    Wrist ulnar deviation    Wrist radial deviation    Wrist pronation    Wrist supination    (Blank rows = not tested)  HAND FUNCTION: TBD  COORDINATION: Slowed movements, TBA at future session  COGNITION: Overall cognitive status:  Pt's spouse reports that his thinking seems slower, but attributes it to stress from all issues presenting s/p fall in October  VISION: Subjective report: glasses broke with recent fall Baseline vision: Wears glasses all the time Visual history: cataracts  VISION ASSESSMENT: Not tested  Patient  has difficulty with following activities due to following visual impairments: currently difficult to assess as pt does not have glasses with him, as they broke during fall in October 2023.  OBSERVATIONS: Pt with slowed response time and movements during transfers to/from treatment space and during assessment.   TODAY'S TREATMENT:    08/23/22 5x sit > stand: 41.75 sec.  Pt still requires multiple attempts 1-2 before coming to full standing with each stand.  Pt demonstrating improvements in carryover of anterior weight shift. Jacket: 57.25 sec.  Pt still with difficulty pulling jacket over shoulders once on BUE (getting caught at midback). Engaged in simulated UB dressing with hospital gown.  OT providing verbal cues and demonstration for hand placement to increase positioning of sleeve on shoulder to allow for decreased reach behind self to obtain other sleeve.  Utilized [employer], engineering for visual feedback for carryover of technique.   OT providing demonstration and verbal cues for large amplitude movement and cues for hand placement to increase success with donning/doffing shirt and jacket.      08/21/22 Buttons: Pt reports difficulty with buttons sometimes, however today he did them without issue when getting dressed.  Pt demonstrating good technique when buttoning, OT still educating on button hook for times when buttons are more challenging.  OT providing hand over hand, demonstration, and verbal cues for technique with use of button hook. Pt may benefit from additional practice with button hook (as he was interested for more difficult days), however did well without it this session.  LB dressing: simulate LB dressing with use of gait belt. Pt verbalizing good technique with dressing L side first.  Pt demonstrating increased difficulty when attempting to complete in simulated manner.  OT providing demonstration and verbal cues, pt then demonstrating increased understanding and demonstration of LB dressing.  Wife reports that pt sometimes attempts donning pants in standing, educated on fall risk and recommendation to complete at sit > stand level at this time. Bed mobility: Pt demonstrating good technique with getting in/out of bed from mat table, however reports still having difficulty with getting tangled up in sheets.  Discussed untucking foot of bed and/or being more cognizant of blanket placement prior to exiting/entering bed to decrease getting tangled up.    08/14/22 5x sit > stand: 43.94 seconds.  Pt continues to demonstrate posterior bias, impacting ability to stand fully uprights on first attempt each time. Pt is able to complete sit > stand without use of UE and no longer pulling up on RW!  OT providing min cues for anterior weight shift and scooting forward prior to standing. ADL: Pt reports still needing assistance with getting shirt on sometimes, however spouse reports noticing significant improvements.  Wife reports  that sometimes shirt is still getting caught on shoulders.  Engaged in simulated UB dressing with hospital gown.  Pt demonstrating difficulty getting second sleeve around body during first attempt, however with improvements on second attempt.  OT providing min cues and assistance for donning simulated button up shirt.  OT providing demonstration and verbal cues for large amplitude movement and cues for hand placement to increase success with doffing shirt.  Discussed and demonstrated hand placement to increase ease with donning pull over shirt.   Transitional movements: pt demonstrating improved sequencing and RW placement during stand pivot transfers mat <> chair. Dynamic standing:  Pt tolerated standing 5 mins during pipe tree puzzle to challenge use of BUE during standing activity.  Pt with no LOB and good endurance during table top  tasks.  Pt did require mod cues for sequencing of tasks, discussed other puzzle activities to challenge cognition.   PATIENT EDUCATION: Education details: ongoing condition specific education.   Person educated: Patient and Spouse Education method: Explanation, Demonstration, and Verbal cues Education comprehension: verbalized understanding and needs further education  HOME EXERCISE PROGRAM: TBD   GOALS: Goals reviewed with patient? Yes  SHORT TERM GOALS: Target date: 08/16/22  Pt and wife will report understanding of adaptive techniques and/or potential DME/AE needs to increase ease, safety, and independence w/ ADLs. Baseline: Goal status: MET - spouse looking in to shower chair options to return to bathing at shower level on 08/14/22  2.  Pt will perform dynamic standing task for 5 mins as needed for simple IADLs w/o LOB using DME and/or countertop support prn  Baseline: decreased standing tolerance Goal status: MET - 08/14/22  3.  Pt will demonstrate improved ease with sit > stand to get up from toilet or other seating options in home by improving score  on sit > stand to decreased fall risk. Baseline: 1:05.94 Goal status: MET - 43.94 sec on 08/14/22  4.  Pt will complete UB dressing (except clothing fasteners) with supervision. Baseline: wife assists with buttons occasionally prior to fall Goal status: NOT MET - wife still needing to assist some days, but not other days   LONG TERM GOALS: Target date: 09/13/22  Pt will demonstrate shower transfers with DME PRN at supervision level. Baseline: currently sponge bathing due to fearfulness of showering, but wants to return to showers.   Goal status: IN PROGRESS  2.  Pt will report increased participation in IADLs (such as cooking) to increase independence with functional IADLs.  Baseline:  Goal status: IN PROGRESS  3.  Pt will complete UB dressing, to include clothing fasteners, at Mod I level with use of AE and/or alternative strategies PRN. Baseline:  Goal status: IN PROGRESS  4.  Pt will complete LB dressing with Supervision/setup with use of AE and/or alternative strategies PRN. Baseline:  Goal status: IN PROGRESS  5.  Pt will demonstrate improved dynamic standing balance for 15 mins as needed to engage in ADLs/IADLs with increased safety/endurance.   Baseline:  Goal status: IN PROGRESS  6. Pt will demonstrate increased ease with dressing as evidenced by decreasing PPT#4(don/ doff jacket) by 10 secs or more.  Baseline: 57.25 sec on 08/23/22  Goal status: IN PROGRESS  ASSESSMENT:  CLINICAL IMPRESSION: Pt continues to benefit from hand over hand, demonstration, and verbal cues for UB dressing tasks this session, benefiting from massed practice for carryover.  Pt benefiting from use of mirror for additional visual feedback on hand placement during UB dressing. OT also providing cues for pt to reset, as he would get overwhelmed with too many steps or increased cues.  PERFORMANCE DEFICITS: in functional skills including ADLs, IADLs, ROM, strength, pain, flexibility, Fine motor  control, Gross motor control, mobility, balance, body mechanics, endurance, decreased knowledge of precautions, decreased knowledge of use of DME, and UE functional use, cognitive skills including attention, memory, safety awareness, sequencing, and thought, and psychosocial skills including environmental adaptation and routines and behaviors.   IMPAIRMENTS: are limiting patient from ADLs and IADLs.   CO-MORBIDITIES: may have co-morbidities  that affects occupational performance. Patient will benefit from skilled OT to address above impairments and improve overall function.  MODIFICATION OR ASSISTANCE TO COMPLETE EVALUATION: Min-Moderate modification of tasks or assist with assess necessary to complete an evaluation.  OT OCCUPATIONAL PROFILE AND HISTORY:  Detailed assessment: Review of records and additional review of physical, cognitive, psychosocial history related to current functional performance.  CLINICAL DECISION MAKING: Moderate - several treatment options, min-mod task modification necessary  REHAB POTENTIAL: Good  EVALUATION COMPLEXITY: Moderate    PLAN:  OT FREQUENCY: 2x/week  OT DURATION: 8 weeks  PLANNED INTERVENTIONS: self care/ADL training, therapeutic exercise, therapeutic activity, neuromuscular re-education, manual therapy, balance training, functional mobility training, ultrasound, biofeedback, compression bandaging, moist heat, cryotherapy, patient/family education, cognitive remediation/compensation, visual/perceptual remediation/compensation, psychosocial skills training, energy conservation, coping strategies training, and DME and/or AE instructions  RECOMMENDED OTHER SERVICES: N/A  CONSULTED AND AGREED WITH PLAN OF CARE: Patient and family member/caregiver  PLAN FOR NEXT SESSION:  BUE ROM for dressing with focus on large amplitude and posture; Reiterate DME/AE for bathroom transfers and safety/independence with ADLs/IADLs. Practice shower transfers with stepping  over shower ledge and continue to discuss DME/AE for bathroom transfers and dressing tasks.  Dynamic standing balance/endurance as well as energy conservation strategies.  Pt to report more concerning/problem areas to focus remaining sessions on.   Sherral Dirocco, [place], OTR/L 08/23/2022, 11:03 AM

## 2022-08-27 ENCOUNTER — Ambulatory Visit: Payer: Medicare Other

## 2022-08-27 ENCOUNTER — Ambulatory Visit: Payer: Medicare Other | Attending: Neurology | Admitting: Occupational Therapy

## 2022-08-27 DIAGNOSIS — R293 Abnormal posture: Secondary | ICD-10-CM

## 2022-08-27 DIAGNOSIS — R262 Difficulty in walking, not elsewhere classified: Secondary | ICD-10-CM

## 2022-08-27 DIAGNOSIS — R2689 Other abnormalities of gait and mobility: Secondary | ICD-10-CM

## 2022-08-27 DIAGNOSIS — R29818 Other symptoms and signs involving the nervous system: Secondary | ICD-10-CM | POA: Diagnosis not present

## 2022-08-27 DIAGNOSIS — R278 Other lack of coordination: Secondary | ICD-10-CM | POA: Diagnosis not present

## 2022-08-27 DIAGNOSIS — R2681 Unsteadiness on feet: Secondary | ICD-10-CM

## 2022-08-27 DIAGNOSIS — M6281 Muscle weakness (generalized): Secondary | ICD-10-CM

## 2022-08-27 NOTE — Therapy (Signed)
OUTPATIENT PHYSICAL THERAPY NEURO TREATMENT   Patient Name: [name] MRN: [id number] DOB:02/05/1936, 87 y.o., male Today's Date: 08/27/2022   PCP: Donnajean Lopes, MD REFERRING PROVIDER: Star Age, MD     END OF SESSION:  PT End of Session - 08/27/22 1441     Visit Number 11    Number of Visits 16    Date for PT Re-Evaluation 09/10/22    Authorization Type Medicare    Progress Note Due on Visit 54    PT Start Time 1445    PT Stop Time 8832    PT Time Calculation (min) 45 min    Equipment Utilized During Treatment Gait belt    Activity Tolerance Patient tolerated treatment well    Behavior During Therapy WFL for tasks assessed/performed               Past Medical History:  Diagnosis Date   Ankylosing spondylitis ([place]) dx'd ~ 1974   Arthritis    "back" (08/10/2015)   BENIGN PROSTATIC HYPERTROPHY, WITH OBSTRUCTION 01/15/2010   Bleeding duodenal ulcer    Bleeding esophageal ulcer    Bleeding stomach ulcer    GERD 02/22/2008   History of blood transfusion 1988   "lost ~ 1/2 of my blood volume from multiple bleeding ulcers"   History of hiatal hernia    HYPERGLYCEMIA 11/18/2007   HYPERLIPIDEMIA 02/22/2008   HYPERTENSION, UNSPECIFIED 11/10/2009   Kidney stones    Osteoarthritis    c-spine   PEPTIC ULCER DISEASE 11/17/2007   Situational depression    "son died in [place] June 14, 2015"   TIA (transient ischemic attack)    "not that I know of in the past; they are trying to determine if I've had one today (08/10/2015)"   Past Surgical History:  Procedure Laterality Date   CATARACT EXTRACTION W/ INTRAOCULAR LENS  IMPLANT, BILATERAL Bilateral 2013   Patient Active Problem List   Diagnosis Date Noted   Ankylosing spondylitis of cervicothoracic region ([place]) 10/16/2016   TIA (transient ischemic attack) 08/10/2015   Carotid stenosis 08/04/2014   Hyperlipidemia 02/21/2012   BENIGN PROSTATIC HYPERTROPHY, WITH OBSTRUCTION 01/15/2010   Essential hypertension 11/10/2009    GERD 02/22/2008   NEPHROLITHIASIS, HX OF 11/17/2007    ONSET DATE: June 13, 2022, PD for 4 years  REFERRING DIAG: Z91.81 (ICD-10-CM) - History of recent fall G20.C (ICD-10-CM) - Primary parkinsonism  THERAPY DIAG:  Muscle weakness (generalized)  Other symptoms and signs involving the nervous system  Unsteadiness on feet  Abnormal posture  Difficulty in walking, not elsewhere classified  Other abnormalities of gait and mobility  Rationale for Evaluation and Treatment: Rehabilitation  SUBJECTIVE:  SUBJECTIVE STATEMENT: Wife reports "he has gotten a lot better" but today is moving slow.  Patient reports working on HEP "some" d/t being busy. Denies questions/concerns on HEP. Reports that he would like to return to driving. Reports that he has not been doing the grocery shopping since [place]- his wife has taken over that errand.   Pt accompanied by: significant other  PERTINENT HISTORY: PD, complex medical history of peptic ulcer disease with history of upper GI bleed, TIA, depression, degenerative disc disease in the neck, osteoarthritis, kidney stones, hyperlipidemia, hypertension, history of hiatal hernia, BPH, arthritis, and ankylosing spondylitis, and parkinsonism  PAIN:  Are you having pain? No PRECAUTIONS: None  WEIGHT BEARING RESTRICTIONS: No  FALLS: Has patient fallen in last 6 months? Yes. Number of falls "several"  LIVING ENVIRONMENT: Lives with: lives with their spouse Lives in: House/apartment Stairs:  stairs inside home to loft, ramp to enter/exit home Has following equipment at home: Gilford Rile - 2 wheeled, since the fall in October  PLOF: [place]: return to PLOF  OBJECTIVE:   TODAY'S TREATMENT: 08/27/22 Activity Comments  Large pivot step 2x10  Transferring cones from counter to mat table and back again turning clockwise/counter  Sidestepping 2x2 min Visual targets  "Stepping stones" Visual targets for step length forwards, side-side, backwards  Gait training Following stepping stone activity to generalize large step length  Standing hip abd and march 2x10 at [employer] rows 3x10 For upper body strength for postural correction         TODAY'S TREATMENT: 08/23/22 Activity Comments  Transfers into/out of chair with RW Required cues to keep walker with him until ready to sit and to reach back for seat before sitting down  5xSTS 20.51 sec with B armrest; posterior LOB on last rep  TUG  31.11 sec with RW; small freezing episode upon turning   Gait training with RW 323 feet with RW completed in 4 min  13 sec; reported pretty mild fatigue; R foot slap and shortened L step length   Push and release in all directions    Multiple steps required to regain balance; required min A in backwards direction; unable to take a step to the L        PATIENT EDUCATION: Education details: discussion on progress towards goals and remaining impairments  Person educated: Patient Education method: Explanation, Demonstration, Tactile cues, and Verbal cues Education comprehension: verbalized understanding    HOME EXERCISE PROGRAM Last updated: 07/24/22 Access Code: [email or web address] URL: [email or web address] Date: 07/24/2022 Prepared by: [facility]  Program Notes perform with gait belt and wife's supervision  Exercises - Standing Balance in Corner  - 1 x daily - 7 x weekly - 3 sets - 30 sec hold - Narrow Stance with Counter Support  - 1 x daily - 5 x weekly - 2 sets - 10 reps - 30 sec hold - Standing Balance in Corner with Eyes Closed  - 1 x daily - 5 x weekly - 2 sets - 10 reps - 30 sec hold - Sit to Stand with Counter Support  - 1 x daily - 5 x weekly - 2 sets - 5-10 reps - Supine Lower  Trunk Rotation  - 1 x daily - 7 x weekly - 3 sets - 10 reps - Supine Bridge  - 1 x daily - 7 x weekly - 3 sets - 10 reps - Thoracic Extension Mobilization on Foam Roll  - 1 x  daily - 7 x weekly - 3 sets - 10 reps - 3 sec hold - Seated Shoulder Horizontal Abduction with Resistance  - 1 x daily - 7 x weekly - 3 sets - 10 reps   Below measures were taken at time of initial evaluation unless otherwise specified:   DIAGNOSTIC FINDINGS: no intracranial or osseous injuries  COGNITION: Overall cognitive status: Within functional limits for tasks assessed   SENSATION: WFL  COORDINATION: Alternating movements impaired, right ankle limitations pre-morbid from hx of crush injury in childhood Heel to shin mildly impaired right > left Finger to nose WNL Finger opposition: unable to perform bilaterally  EDEMA:  none  MUSCLE TONE: NT  MUSCLE LENGTH: Presents with bilateral knee flexion contractures, approx 5-10 degrees  DTRs:    POSTURE: rounded shoulders, forward head, and increased thoracic kyphosis  LOWER EXTREMITY ROM:     Active  Right Eval Left Eval  Hip flexion    Hip extension    Hip abduction    Hip adduction    Hip internal rotation    Hip external rotation    Knee flexion    Knee extension -10 -10  Ankle dorsiflexion 5 10  Ankle plantarflexion    Ankle inversion    Ankle eversion     (Blank rows = not tested)  LOWER EXTREMITY MMT:  assessed in sitting  MMT Right Eval Left Eval  Hip flexion 4 4  Hip extension    Hip abduction 3+ 3+  Hip adduction 4- 4-  Hip internal rotation    Hip external rotation    Knee flexion 4 4  Knee extension 4- 4-  Ankle dorsiflexion 2+ 3+  Ankle plantarflexion    Ankle inversion    Ankle eversion    (Blank rows = not tested)  BED MOBILITY:  Mod A  TRANSFERS: Assistive device utilized: Environmental consultant - 2 wheeled  Sit to stand: CGA and Min A Stand to sit: SBA Chair to chair: CGA and Min A Floor:  DNT  RAMP:  NT  CURB:   Level of Assistance: CGA Assistive device utilized: Environmental consultant - 2 wheeled Curb Comments:   STAIRS: NT  GAIT: Gait pattern:  right knee extensor thrust in stance, shuffling, and trunk flexed, deficits during turning Distance walked:  Assistive device utilized: Environmental consultant - 2 wheeled Level of assistance: CGA and Min A Comments: unsteady, deficits during turns  FUNCTIONAL TESTS:  5 times sit to stand: unable to complete, retro-pulsion Timed up and go (TUG): 29 sec Push and release test (# of steps): Anterior: absent  Posterior: absent  M-CTSIB  Condition 1: Firm Surface, EO 30 Sec, Mild Sway  Condition 2: Firm Surface, EC 30 Sec, Mild and Moderate Sway  Condition 3: Foam Surface, EO 0 Sec,  unable  Sway  Condition 4: Foam Surface, EC - Sec,  -  Sway     VESTIBULAR ASSESSMENT   GENERAL OBSERVATION: right eyebrow ptosis?    SYMPTOM BEHAVIOR:   Subjective history: fall and strike to anterior right forehead   Non-Vestibular symptoms: denies   Type of dizziness: Imbalance (Disequilibrium) and Lightheadedness/Faint   Frequency: "depends"   Duration: "whenever"   Aggravating factors: No known aggravating factors   Relieving factors: no known relieving factors   Progression of symptoms: unchanged   OCULOMOTOR EXAM:   Ocular Alignment: right eye ptosis   Ocular ROM: No Limitations   Spontaneous Nystagmus: absent   Gaze-Induced Nystagmus: age appropriate nystagmus at end range   Smooth Pursuits: saccades  Saccades: hypometric/undershoots, extra eye movements, and slow   Convergence/Divergence: 10 inches      VESTIBULAR - OCULAR REFLEX:    Slow VOR: Comment: difficulty in tracking   VOR Cancellation: Comment: NT   Head-Impulse Test: NT   Dynamic Visual Acuity: Not able to be assessed    POSITIONAL TESTING: unable to test due to time limitation    MOTION SENSITIVITY:    Motion Sensitivity Quotient  Intensity: 0 = none, 1 = Lightheaded, 2 = Mild, 3 = Moderate, 4 = Severe, 5  = Vomiting  Intensity  1. Sitting to supine   2. Supine to L side   3. Supine to R side   4. Supine to sitting   5. L Hallpike-Dix   6. Up from L    7. R Hallpike-Dix   8. Up from R    9. Sitting, head  tipped to L knee   10. Head up from L  knee   11. Sitting, head  tipped to R knee   12. Head up from R  knee   13. Sitting head turns x5   14.Sitting head nods x5   15. In stance, 180  turn to L    16. In stance, 180  turn to R     OTHOSTATICS: not done  FUNCTIONAL GAIT:    VESTIBULAR TREATMENT:  Canalith Repositioning:   TBD Gaze Adaptation:   TBD Habituation:   TBD Other:  PATIENT SURVEYS:    PATIENT EDUCATION: Education details: assessment findings/interpretations Person educated: Patient and Spouse Education method: Explanation Education comprehension: verbalized understanding   GOALS: Goals reviewed with patient? Yes  SHORT TERM GOALS: Target date: 08/13/2022    Patient will perform HEP with family/caregiver supervision for improved strength, balance, transfers, gaze adaptation, habituation, balance, and gait  Baseline: Goal status: MET   2.  Demonstrate functional transfers with supervision level with or without AD to improve safety with functional mobility Baseline: CGA-min A; supervision/verbal cueing 08/23/22 Goal status: MET 08/23/22  3.  Manifest improved BLE strength as evidenced by ability to complete 5xSTS test in 25 sec to improve safety with mobility Baseline: unable; 20.51 sec 08/23/22 Goal status: MET 08/23/22   LONG TERM GOALS: Target date: 09/10/2022    Demonstrate reduce risk for falls per time 15 sec TUG test with least restrictive AD Baseline: 29 sec w/ RW; 31.11 sec with RW 08/23/22 Goal status: IN PROGRESS 08/23/22  2.  Demo ability to transfer and ambulate over various surfaces x 300 ft with modified independence to reduce level of assistance from caregiver Baseline: CGA-min A w/ RW; completed with CGA  08/23/22 Goal status: MET 08/23/22  3.  Demonstrate improved reactive balance as evidenced by ability to enact 2-3 steps with push-release test to facilitate righting reactions and reduce risk for falls Baseline: Absent, LOB; multiple steps required- min A in posterior direction, unable to the L Goal status: IN PROGRESS 08/23/22    ASSESSMENT:  CLINICAL IMPRESSION: Good ability to generalize large step length following practice with visual targets. Difficulty in maintaining step length through turns but improved on straight path.  Facilitation/assist for form in sidestepping and retrowalking requiring tactile cues for form, balance, and stability. Good return demonstrtaion on pivoting activity with emphasis on quick change of direction but requiring several, small steps for 180 degree turns. Continued sessions to advance balance, motor control, and gait training to reduce risk for falls and improve safety with ambulation  OBJECTIVE IMPAIRMENTS: Abnormal gait, decreased activity tolerance,  decreased balance, decreased coordination, decreased knowledge of use of DME, difficulty walking, decreased ROM, decreased strength, dizziness, impaired flexibility, improper body mechanics, and postural dysfunction.   ACTIVITY LIMITATIONS: carrying, lifting, bending, standing, squatting, stairs, transfers, bed mobility, bathing, dressing, reach over head, and locomotion level  PARTICIPATION LIMITATIONS: meal prep, cleaning, laundry, shopping, community activity, and yard work  PERSONAL FACTORS: Age, Time since onset of injury/illness/exacerbation, and 3+ comorbidities: see medical hx  are also affecting patient's functional outcome.   REHAB POTENTIAL: Good  CLINICAL DECISION MAKING: Evolving/moderate complexity  EVALUATION COMPLEXITY: Moderate  PLAN:  PT FREQUENCY: 2x/week  PT DURATION: 8 weeks  PLANNED INTERVENTIONS: Therapeutic exercises, Therapeutic activity, Neuromuscular re-education, Balance  training, Gait training, Patient/Family education, Self Care, Joint mobilization, Stair training, Vestibular training, Canalith repositioning, DME instructions, Aquatic Therapy, Dry Needling, Electrical stimulation, Wheelchair mobility training, Spinal mobilization, Cryotherapy, Moist heat, Taping, and Manual therapy  PLAN FOR NEXT SESSION: Practice steps; work on improving gait speed and balance recovery   3:47 PM, 08/27/22 M. Sherlyn Lees, PT, DPT Physical Therapist- [place] Office Number: [phone]   [facility] [address], [place], [place] [address] Phone # [phone] Fax # [phone]

## 2022-08-27 NOTE — Patient Instructions (Signed)
Bag Exercises:  Small trash bag or towel works best.  For all exercises, sit with big posture (sit up tall with head up, at edge of chair) and use big movements. **It may be helpful to sit in front of a mirror or place a target on a wall to increase upright posture.**  Perform the following exercises 1 times per day.  Hold bag in one hand. Stretch both arms/hands out to the side as big as you can. Then, pass bag from one hand to the other IN FRONT of you. Stretch arms back out big after each pass. Repeat 10 times. Hold bag in one hand. Stretch both arms/hands out to the side as big as you can. Then, pass bag from one hand to the other BEHIND you. Stretch arms back out big after each pass. Repeat 10 times. Hold bag in both hands in front of you with hands/arms shoulder length apart. Move bag behind your head. Repeat 10 times. Hold bag in both hands in front of you with hands/arms shoulder length apart. Lift leg and move bag completely under each foot and back. Repeat 10 times on each side. Hold bag in right hand. Move right hand to reach behind shoulder. Then, reach behind back with left hand to pass bag from right hand to left hand. Switch sides. Repeat 10 times on each side.

## 2022-08-27 NOTE — Therapy (Signed)
OUTPATIENT OCCUPATIONAL THERAPY Treatment Session  Patient Name: [name] MRN: [id number] DOB:[birth year], 87 y.o., male Today's Date: 08/27/2022  PCP: Donnajean Lopes, MD REFERRING PROVIDER: Star Age, MD     END OF SESSION:  OT End of Session - 08/27/22 1406     Visit Number 11    Number of Visits 17    Date for OT Re-Evaluation 09/13/22    Authorization Type Medicare A &B    OT Start Time 6468    OT Stop Time 0321    OT Time Calculation (min) 42 min                       Past Medical History:  Diagnosis Date   Ankylosing spondylitis ([place]) dx'd ~ 1974   Arthritis    "back" (08/10/2015)   BENIGN PROSTATIC HYPERTROPHY, WITH OBSTRUCTION 01/15/2010   Bleeding duodenal ulcer    Bleeding esophageal ulcer    Bleeding stomach ulcer    GERD 02/22/2008   History of blood transfusion 1988   "lost ~ 1/2 of my blood volume from multiple bleeding ulcers"   History of hiatal hernia    HYPERGLYCEMIA 11/18/2007   HYPERLIPIDEMIA 02/22/2008   HYPERTENSION, UNSPECIFIED 11/10/2009   Kidney stones    Osteoarthritis    c-spine   PEPTIC ULCER DISEASE 11/17/2007   Situational depression    "son died in [place] 2015/06/26"   TIA (transient ischemic attack)    "not that I know of in the past; they are trying to determine if I've had one today (08/10/2015)"   Past Surgical History:  Procedure Laterality Date   CATARACT EXTRACTION W/ INTRAOCULAR LENS  IMPLANT, BILATERAL Bilateral 2013   Patient Active Problem List   Diagnosis Date Noted   Ankylosing spondylitis of cervicothoracic region ([place]) 10/16/2016   TIA (transient ischemic attack) 08/10/2015   Carotid stenosis 08/04/2014   Hyperlipidemia 02/21/2012   BENIGN PROSTATIC HYPERTROPHY, WITH OBSTRUCTION 01/15/2010   Essential hypertension 11/10/2009   GERD 02/22/2008   NEPHROLITHIASIS, HX OF 11/17/2007    ONSET DATE: referral date 07/04/22   REFERRING DIAG: G20.C (ICD-10-CM) - Parkinsonism, unspecified Z91.81  (ICD-10-CM) - History of falling  THERAPY DIAG:  Muscle weakness (generalized)  Other symptoms and signs involving the nervous system  Unsteadiness on feet  Abnormal posture  Rationale for Evaluation and Treatment: Rehabilitation  SUBJECTIVE:   SUBJECTIVE STATEMENT: Pt reports going for a walk at a [employer] park.    Pt accompanied by: self  PERTINENT HISTORY: history of Parkinson's and ankylosing spondylitis   PRECAUTIONS: Fall  WEIGHT BEARING RESTRICTIONS: No  PAIN:  Are you having pain? Yes: NPRS scale: 2/10 Pain location: neck Pain description: stiff Aggravating factors: reaching Relieving factors: rest  FALLS: Has patient fallen in last 6 months? Yes. Number of falls 6 (3-4 since "big fall" in June 26, 2023)  LIVING ENVIRONMENT: Lives with: lives with their spouse Lives in: House/apartment Stairs: Yes: External: 2-3 steps; has had a ramp built just in last week Internal: steps to basement and steps to loft - however pt does not need to go up/down those at this time. Has following equipment at home: [employer] Rile - 2 wheeled, bed side commode, Ramped entry, and transport chair  PLOF: Independent  PATIENT GOALS: to be able to feel confident in any sort of movement to resume prior activities  OBJECTIVE:   HAND DOMINANCE: Right  ADLs: Overall ADLs: Prior to fall in Jun 25, 2022, pt Independent with all ADLs and IADLs  without use of AE/DME, even completing shower in standing in walk-in shower. Transfers/ambulation related to ADLs: fluctuates with mobility; now using RW for all mobility, Supervision with all mobility Eating: Mod I Grooming: wife will assist with opening items; otherwise setup and supervision due to balance UB Dressing: Min-mod assist, buttons/zippers are quite challenging LB Dressing: Mod assist, is able to don socks/shoes with increased time Toileting: Max assist Bathing: sponge baths since fall, max assist from wife at this time Tub Shower transfers:  has walk-in shower, but does not feel safe in shower since fall in Oct Equipment: bed side commode  IADLs: Meal Prep: since fall in October 2023, pt has been assisting with 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  Pt currently cutting foods for meal prep in sitting position. Medication management: wife assisting Handwriting:  TBA  MOBILITY STATUS: Needs Assist: CGA with mobility with RW and Hx of falls  POSTURE COMMENTS:  rounded shoulders and forward head  FUNCTIONAL OUTCOME MEASURES: Physical performance test: PPT #4 donning/doffing jacket: unable to complete due to LOB* 5 time sit > stand:  1:05.94 with up to mod assist and cues to scoot forward towards edge of chair and needing to push up from RW.  UPPER EXTREMITY ROM:  all movements are slowed, decreased shoulder flexion d/t ankylosing spondylitis  Active ROM Right eval Left eval  Shoulder flexion 90 90  Shoulder abduction    Shoulder adduction    Shoulder extension    Shoulder internal rotation [facility]  Shoulder external rotation [facility]  Elbow flexion [facility] WFL  Elbow extension [facility] WFL  Wrist flexion    Wrist extension    Wrist ulnar deviation    Wrist radial deviation    Wrist pronation    Wrist supination    (Blank rows = not tested)  UPPER EXTREMITY MMT:     MMT Right eval Left eval  Shoulder flexion 4/5 4/5  Shoulder abduction    Shoulder adduction    Shoulder extension    Shoulder internal rotation    Shoulder external rotation    Middle trapezius    Lower trapezius    Elbow flexion 4/5 4/5  Elbow extension 4/5 4/5  Wrist flexion    Wrist extension    Wrist ulnar deviation    Wrist radial deviation    Wrist pronation    Wrist supination    (Blank rows = not tested)  HAND FUNCTION: TBD  COORDINATION: Slowed movements, TBA at future session  COGNITION: Overall cognitive status:  Pt's spouse reports that his thinking seems slower, but attributes it to stress from all issues presenting s/p fall in  October  VISION: Subjective report: glasses broke with recent fall Baseline vision: Wears glasses all the time Visual history: cataracts  VISION ASSESSMENT: Not tested  Patient has difficulty with following activities due to following visual impairments: currently difficult to assess as pt does not have glasses with him, as they broke during fall in October 2023.  OBSERVATIONS: Pt with slowed response time and movements during transfers to/from treatment space and during assessment.   TODAY'S TREATMENT:    08/27/22 Shower: Pt reports that he does not plan to shower until he gets the appropriate equipment.  Spouse reports increased time to get dressed is impacting their ability to get out to the store to look at recommended equipment.   Bag/towel exercises: engaged in [place] reaching overhead and back to simulate donning/doffing jacket as well as reaching behind shoulder with RUE and then  reach behind back with left hand to pass bag from right hand to left hand, then switch sides.  Pt requiring mod multimodal cues for sequencing, benefiting from demonstration and intermittent tactile cues at hip or trunk to facilitate upright sitting posture.  OT placed mirror infront of pt to further facilitate upright sitting posture with cues to may eye contact with self with moving towel/bag behind head and shoulders to increase upright posture.   Large amplitude reaching: engaged in reaching behind self and in front with focus on large amplitude/full opening of arms between sets.  OT providing intermittent tactile cues and mod verbal cues for upright posture and technique.  Utilized ball for this task to carry over to more familiar task from exercise class.    08/23/22 5x sit > stand: 41.75 sec.  Pt still requires multiple attempts 1-2 before coming to full standing with each stand.  Pt demonstrating improvements in carryover of anterior weight shift. Jacket: 57.25 sec.  Pt still with difficulty pulling  jacket over shoulders once on BUE (getting caught at midback). Engaged in simulated UB dressing with hospital gown.  OT providing verbal cues and demonstration for hand placement to increase positioning of sleeve on shoulder to allow for decreased reach behind self to obtain other sleeve.  Utilized Geologist, engineering for visual feedback for carryover of technique.  OT providing demonstration and verbal cues for large amplitude movement and cues for hand placement to increase success with donning/doffing shirt and jacket.      08/21/22 Buttons: Pt reports difficulty with buttons sometimes, however today he did them without issue when getting dressed.  Pt demonstrating good technique when buttoning, OT still educating on button hook for times when buttons are more challenging.  OT providing hand over hand, demonstration, and verbal cues for technique with use of button hook. Pt may benefit from additional practice with button hook (as he was interested for more difficult days), however did well without it this session.  LB dressing: simulate LB dressing with use of gait belt. Pt verbalizing good technique with dressing L side first.  Pt demonstrating increased difficulty when attempting to complete in simulated manner.  OT providing demonstration and verbal cues, pt then demonstrating increased understanding and demonstration of LB dressing.  Wife reports that pt sometimes attempts donning pants in standing, educated on fall risk and recommendation to complete at sit > stand level at this time. Bed mobility: Pt demonstrating good technique with getting in/out of bed from mat table, however reports still having difficulty with getting tangled up in sheets.  Discussed untucking foot of bed and/or being more cognizant of blanket placement prior to exiting/entering bed to decrease getting tangled up.   PATIENT EDUCATION: Education details: ongoing condition specific education.   Person educated: Patient and  Spouse Education method: Explanation, Demonstration, and Verbal cues Education comprehension: verbalized understanding and needs further education  HOME EXERCISE PROGRAM: TBD   GOALS: Goals reviewed with patient? Yes  SHORT TERM GOALS: Target date: 08/16/22  Pt and wife will report understanding of adaptive techniques and/or potential DME/AE needs to increase ease, safety, and independence w/ ADLs. Baseline: Goal status: MET - spouse looking in to shower chair options to return to bathing at shower level on 08/14/22  2.  Pt will perform dynamic standing task for 5 mins as needed for simple IADLs w/o LOB using DME and/or countertop support prn  Baseline: decreased standing tolerance Goal status: MET - 08/14/22  3.  Pt will demonstrate improved ease with  sit > stand to get up from toilet or other seating options in home by improving score on sit > stand to decreased fall risk. Baseline: 1:05.94 Goal status: MET - 43.94 sec on 08/14/22  4.  Pt will complete UB dressing (except clothing fasteners) with supervision. Baseline: wife assists with buttons occasionally prior to fall Goal status: NOT MET - wife still needing to assist some days, but not other days   LONG TERM GOALS: Target date: 09/13/22  Pt will demonstrate shower transfers with DME PRN at supervision level. Baseline: currently sponge bathing due to fearfulness of showering, but wants to return to showers.   Goal status: IN PROGRESS  2.  Pt will report increased participation in IADLs (such as cooking) to increase independence with functional IADLs.  Baseline:  Goal status: IN PROGRESS  3.  Pt will complete UB dressing, to include clothing fasteners, at Mod I level with use of AE and/or alternative strategies PRN. Baseline:  Goal status: IN PROGRESS  4.  Pt will complete LB dressing with Supervision/setup with use of AE and/or alternative strategies PRN. Baseline:  Goal status: IN PROGRESS  5.  Pt will  demonstrate improved dynamic standing balance for 15 mins as needed to engage in ADLs/IADLs with increased safety/endurance.   Baseline:  Goal status: IN PROGRESS  6. Pt will demonstrate increased ease with dressing as evidenced by decreasing PPT#4(don/ doff jacket) by 10 secs or more.  Baseline: 57.25 sec on 08/23/22  Goal status: IN PROGRESS  ASSESSMENT:  CLINICAL IMPRESSION: Pt continues to benefit from hand over hand, demonstration, and verbal cues for exercises to simulate UB bathing/dressing, benefiting from massed practice for carryover.  Utilized Geologist, engineering for additional visual feedback on body positioning/posture. Tactile cues utilized for upright trunk posture as pt easily distracted by increased verbal cues.  PERFORMANCE DEFICITS: in functional skills including ADLs, IADLs, ROM, strength, pain, flexibility, Fine motor control, Gross motor control, mobility, balance, body mechanics, endurance, decreased knowledge of precautions, decreased knowledge of use of DME, and UE functional use, cognitive skills including attention, memory, safety awareness, sequencing, and thought, and psychosocial skills including environmental adaptation and routines and behaviors.   IMPAIRMENTS: are limiting patient from ADLs and IADLs.   CO-MORBIDITIES: may have co-morbidities  that affects occupational performance. Patient will benefit from skilled OT to address above impairments and improve overall function.  MODIFICATION OR ASSISTANCE TO COMPLETE EVALUATION: Min-Moderate modification of tasks or assist with assess necessary to complete an evaluation.  OT OCCUPATIONAL PROFILE AND HISTORY: Detailed assessment: Review of records and additional review of physical, cognitive, psychosocial history related to current functional performance.  CLINICAL DECISION MAKING: Moderate - several treatment options, min-mod task modification necessary  REHAB POTENTIAL: Good  EVALUATION COMPLEXITY:  Moderate    PLAN:  OT FREQUENCY: 2x/week  OT DURATION: 8 weeks  PLANNED INTERVENTIONS: self care/ADL training, therapeutic exercise, therapeutic activity, neuromuscular re-education, manual therapy, balance training, functional mobility training, ultrasound, biofeedback, compression bandaging, moist heat, cryotherapy, patient/family education, cognitive remediation/compensation, visual/perceptual remediation/compensation, psychosocial skills training, energy conservation, coping strategies training, and DME and/or AE instructions  RECOMMENDED OTHER SERVICES: N/A  CONSULTED AND AGREED WITH PLAN OF CARE: Patient and family member/caregiver  PLAN FOR NEXT SESSION:  BUE ROM for dressing with focus on large amplitude and posture; Reiterate DME/AE for bathroom transfers and safety/independence with ADLs/IADLs. Practice shower transfers with stepping over shower ledge and continue to discuss DME/AE for bathroom transfers and dressing tasks.  Dynamic standing balance/endurance as well as energy conservation strategies.  Pt to report more concerning/problem areas to focus remaining sessions on.   Simonne Come, OTR/L 08/27/2022, 3:12 PM

## 2022-08-29 ENCOUNTER — Ambulatory Visit: Payer: Medicare Other | Admitting: Occupational Therapy

## 2022-08-29 ENCOUNTER — Ambulatory Visit: Payer: Medicare Other

## 2022-08-29 DIAGNOSIS — M6281 Muscle weakness (generalized): Secondary | ICD-10-CM | POA: Diagnosis not present

## 2022-08-29 DIAGNOSIS — R278 Other lack of coordination: Secondary | ICD-10-CM | POA: Diagnosis not present

## 2022-08-29 DIAGNOSIS — R2689 Other abnormalities of gait and mobility: Secondary | ICD-10-CM | POA: Diagnosis not present

## 2022-08-29 DIAGNOSIS — R2681 Unsteadiness on feet: Secondary | ICD-10-CM

## 2022-08-29 DIAGNOSIS — R262 Difficulty in walking, not elsewhere classified: Secondary | ICD-10-CM

## 2022-08-29 DIAGNOSIS — R293 Abnormal posture: Secondary | ICD-10-CM

## 2022-08-29 DIAGNOSIS — R29818 Other symptoms and signs involving the nervous system: Secondary | ICD-10-CM | POA: Diagnosis not present

## 2022-08-29 NOTE — Therapy (Signed)
OUTPATIENT PHYSICAL THERAPY NEURO TREATMENT   Patient Name: [name] MRN: [id number] DOB:April 11, 1936, 87 y.o., male Today's Date: 08/29/2022   PCP: Donnajean Lopes, MD REFERRING PROVIDER: Star Age, MD     END OF SESSION:  PT End of Session - 08/29/22 1452     Visit Number 12    Number of Visits 16    Date for PT Re-Evaluation 09/10/22    Authorization Type Medicare    Progress Note Due on Visit 55    PT Start Time 1445    PT Stop Time 3606    PT Time Calculation (min) 45 min    Equipment Utilized During Treatment Gait belt    Activity Tolerance Patient tolerated treatment well    Behavior During Therapy WFL for tasks assessed/performed               Past Medical History:  Diagnosis Date   Ankylosing spondylitis ([place]) dx'd ~ 1974   Arthritis    "back" (08/10/2015)   BENIGN PROSTATIC HYPERTROPHY, WITH OBSTRUCTION 01/15/2010   Bleeding duodenal ulcer    Bleeding esophageal ulcer    Bleeding stomach ulcer    GERD 02/22/2008   History of blood transfusion 1988   "lost ~ 1/2 of my blood volume from multiple bleeding ulcers"   History of hiatal hernia    HYPERGLYCEMIA 11/18/2007   HYPERLIPIDEMIA 02/22/2008   HYPERTENSION, UNSPECIFIED 11/10/2009   Kidney stones    Osteoarthritis    c-spine   PEPTIC ULCER DISEASE 11/17/2007   Situational depression    "son died in [place] 15-Jun-2015"   TIA (transient ischemic attack)    "not that I know of in the past; they are trying to determine if I've had one today (08/10/2015)"   Past Surgical History:  Procedure Laterality Date   CATARACT EXTRACTION W/ INTRAOCULAR LENS  IMPLANT, BILATERAL Bilateral 2013   Patient Active Problem List   Diagnosis Date Noted   Ankylosing spondylitis of cervicothoracic region ([place]) 10/16/2016   TIA (transient ischemic attack) 08/10/2015   Carotid stenosis 08/04/2014   Hyperlipidemia 02/21/2012   BENIGN PROSTATIC HYPERTROPHY, WITH OBSTRUCTION 01/15/2010   Essential hypertension 11/10/2009    GERD 02/22/2008   NEPHROLITHIASIS, HX OF 11/17/2007    ONSET DATE: 06/14/2022, PD for 4 years  REFERRING DIAG: Z91.81 (ICD-10-CM) - History of recent fall G20.C (ICD-10-CM) - Primary parkinsonism  THERAPY DIAG:  Muscle weakness (generalized)  Other symptoms and signs involving the nervous system  Unsteadiness on feet  Abnormal posture  Difficulty in walking, not elsewhere classified  Other abnormalities of gait and mobility  Rationale for Evaluation and Treatment: Rehabilitation  SUBJECTIVE:  SUBJECTIVE STATEMENT: Feeling pretty tired today  Pt accompanied by: significant other  PERTINENT HISTORY: PD, complex medical history of peptic ulcer disease with history of upper GI bleed, TIA, depression, degenerative disc disease in the neck, osteoarthritis, kidney stones, hyperlipidemia, hypertension, history of hiatal hernia, BPH, arthritis, and ankylosing spondylitis, and parkinsonism  PAIN:  Are you having pain? No PRECAUTIONS: None  WEIGHT BEARING RESTRICTIONS: No  FALLS: Has patient fallen in last 6 months? Yes. Number of falls "several"  LIVING ENVIRONMENT: Lives with: lives with their spouse Lives in: House/apartment Stairs:  stairs inside home to loft, ramp to enter/exit home Has following equipment at home: Gilford Rile - 2 wheeled, since the fall in October  PLOF: [place]: return to PLOF  OBJECTIVE:   TODAY'S TREATMENT: 08/29/22 Activity Comments  Supine PRE -SAQ 3x10 5# - hip add iso 3x10 -PWR! Twist 2x10-tactile faciliatoin PWR! Step 2x5- physical assist for cues -prone hamstring curls 1x10  Transfer/mobility -quadruped on mat table to standing bedside -sit to stand 3x5 from EOM w/ 5# -heavy unilateral cable rows 3x12 reps 20# to improve opening doors                 TODAY'S TREATMENT: 08/27/22 Activity Comments  Large pivot step 2x10 Transferring cones from counter to mat table and back again turning clockwise/counter  Sidestepping 2x2 min Visual targets  "Stepping stones" Visual targets for step length forwards, side-side, backwards  Gait training Following stepping stone activity to generalize large step length  Standing hip abd and march 2x10 at [employer] rows 3x10 For upper body strength for postural correction         TODAY'S TREATMENT: 08/23/22 Activity Comments  Transfers into/out of chair with RW Required cues to keep walker with him until ready to sit and to reach back for seat before sitting down  5xSTS 20.51 sec with B armrest; posterior LOB on last rep  TUG  31.11 sec with RW; small freezing episode upon turning   Gait training with RW 323 feet with RW completed in 4 min  13 sec; reported pretty mild fatigue; R foot slap and shortened L step length   Push and release in all directions    Multiple steps required to regain balance; required min A in backwards direction; unable to take a step to the L        PATIENT EDUCATION: Education details: discussion on progress towards goals and remaining impairments  Person educated: Patient Education method: Explanation, Demonstration, Tactile cues, and Verbal cues Education comprehension: verbalized understanding    HOME EXERCISE PROGRAM Last updated: 07/24/22 Access Code: [email or web address] URL: [email or web address] Date: 07/24/2022 Prepared by: [facility]  Program Notes perform with gait belt and wife's supervision  Exercises - Standing Balance in Corner  - 1 x daily - 7 x weekly - 3 sets - 30 sec hold - Narrow Stance with Counter Support  - 1 x daily - 5 x weekly - 2 sets - 10 reps - 30 sec hold - Standing Balance in Corner with Eyes Closed  - 1 x daily - 5 x weekly - 2 sets - 10 reps - 30 sec hold - Sit to  Stand with Counter Support  - 1 x daily - 5 x weekly - 2 sets - 5-10 reps - Supine Lower Trunk Rotation  - 1 x daily - 7 x weekly - 3 sets - 10 reps - Supine Bridge  - 1 x  daily - 7 x weekly - 3 sets - 10 reps - Thoracic Extension Mobilization on Foam Roll  - 1 x daily - 7 x weekly - 3 sets - 10 reps - 3 sec hold - Seated Shoulder Horizontal Abduction with Resistance  - 1 x daily - 7 x weekly - 3 sets - 10 reps   Below measures were taken at time of initial evaluation unless otherwise specified:   DIAGNOSTIC FINDINGS: no intracranial or osseous injuries  COGNITION: Overall cognitive status: Within functional limits for tasks assessed   SENSATION: WFL  COORDINATION: Alternating movements impaired, right ankle limitations pre-morbid from hx of crush injury in childhood Heel to shin mildly impaired right > left Finger to nose WNL Finger opposition: unable to perform bilaterally  EDEMA:  none  MUSCLE TONE: NT  MUSCLE LENGTH: Presents with bilateral knee flexion contractures, approx 5-10 degrees  DTRs:    POSTURE: rounded shoulders, forward head, and increased thoracic kyphosis  LOWER EXTREMITY ROM:     Active  Right Eval Left Eval  Hip flexion    Hip extension    Hip abduction    Hip adduction    Hip internal rotation    Hip external rotation    Knee flexion    Knee extension -10 -10  Ankle dorsiflexion 5 10  Ankle plantarflexion    Ankle inversion    Ankle eversion     (Blank rows = not tested)  LOWER EXTREMITY MMT:  assessed in sitting  MMT Right Eval Left Eval  Hip flexion 4 4  Hip extension    Hip abduction 3+ 3+  Hip adduction 4- 4-  Hip internal rotation    Hip external rotation    Knee flexion 4 4  Knee extension 4- 4-  Ankle dorsiflexion 2+ 3+  Ankle plantarflexion    Ankle inversion    Ankle eversion    (Blank rows = not tested)  BED MOBILITY:  Mod A  TRANSFERS: Assistive device utilized: Environmental consultant - 2 wheeled  Sit to stand: CGA and  Min A Stand to sit: SBA Chair to chair: CGA and Min A Floor:  DNT  RAMP:  NT  CURB:  Level of Assistance: CGA Assistive device utilized: Environmental consultant - 2 wheeled Curb Comments:   STAIRS: NT  GAIT: Gait pattern:  right knee extensor thrust in stance, shuffling, and trunk flexed, deficits during turning Distance walked:  Assistive device utilized: Environmental consultant - 2 wheeled Level of assistance: CGA and Min A Comments: unsteady, deficits during turns  FUNCTIONAL TESTS:  5 times sit to stand: unable to complete, retro-pulsion Timed up and go (TUG): 29 sec Push and release test (# of steps): Anterior: absent  Posterior: absent  M-CTSIB  Condition 1: Firm Surface, EO 30 Sec, Mild Sway  Condition 2: Firm Surface, EC 30 Sec, Mild and Moderate Sway  Condition 3: Foam Surface, EO 0 Sec,  unable  Sway  Condition 4: Foam Surface, EC - Sec,  -  Sway     VESTIBULAR ASSESSMENT   GENERAL OBSERVATION: right eyebrow ptosis?    SYMPTOM BEHAVIOR:   Subjective history: fall and strike to anterior right forehead   Non-Vestibular symptoms: denies   Type of dizziness: Imbalance (Disequilibrium) and Lightheadedness/Faint   Frequency: "depends"   Duration: "whenever"   Aggravating factors: No known aggravating factors   Relieving factors: no known relieving factors   Progression of symptoms: unchanged   OCULOMOTOR EXAM:   Ocular Alignment: right eye ptosis   Ocular ROM:  No Limitations   Spontaneous Nystagmus: absent   Gaze-Induced Nystagmus: age appropriate nystagmus at end range   Smooth Pursuits: saccades   Saccades: hypometric/undershoots, extra eye movements, and slow   Convergence/Divergence: 10 inches      VESTIBULAR - OCULAR REFLEX:    Slow VOR: Comment: difficulty in tracking   VOR Cancellation: Comment: NT   Head-Impulse Test: NT   Dynamic Visual Acuity: Not able to be assessed    POSITIONAL TESTING: unable to test due to time limitation    MOTION SENSITIVITY:    Motion  Sensitivity Quotient  Intensity: 0 = none, 1 = Lightheaded, 2 = Mild, 3 = Moderate, 4 = Severe, 5 = Vomiting  Intensity  1. Sitting to supine   2. Supine to L side   3. Supine to R side   4. Supine to sitting   5. L Hallpike-Dix   6. Up from L    7. R Hallpike-Dix   8. Up from R    9. Sitting, head  tipped to L knee   10. Head up from L  knee   11. Sitting, head  tipped to R knee   12. Head up from R  knee   13. Sitting head turns x5   14.Sitting head nods x5   15. In stance, 180  turn to L    16. In stance, 180  turn to R     OTHOSTATICS: not done  FUNCTIONAL GAIT:    VESTIBULAR TREATMENT:  Canalith Repositioning:   TBD Gaze Adaptation:   TBD Habituation:   TBD Other:  PATIENT SURVEYS:    PATIENT EDUCATION: Education details: assessment findings/interpretations Person educated: Patient and Spouse Education method: Explanation Education comprehension: verbalized understanding   GOALS: Goals reviewed with patient? Yes  SHORT TERM GOALS: Target date: 08/13/2022    Patient will perform HEP with family/caregiver supervision for improved strength, balance, transfers, gaze adaptation, habituation, balance, and gait  Baseline: Goal status: MET   2.  Demonstrate functional transfers with supervision level with or without AD to improve safety with functional mobility Baseline: CGA-min A; supervision/verbal cueing 08/23/22 Goal status: MET 08/23/22  3.  Manifest improved BLE strength as evidenced by ability to complete 5xSTS test in 25 sec to improve safety with mobility Baseline: unable; 20.51 sec 08/23/22 Goal status: MET 08/23/22   LONG TERM GOALS: Target date: 09/10/2022    Demonstrate reduce risk for falls per time 15 sec TUG test with least restrictive AD Baseline: 29 sec w/ RW; 31.11 sec with RW 08/23/22 Goal status: IN PROGRESS 08/23/22  2.  Demo ability to transfer and ambulate over various surfaces x 300 ft with modified independence to  reduce level of assistance from caregiver Baseline: CGA-min A w/ RW; completed with CGA 08/23/22 Goal status: MET 08/23/22  3.  Demonstrate improved reactive balance as evidenced by ability to enact 2-3 steps with push-release test to facilitate righting reactions and reduce risk for falls Baseline: Absent, LOB; multiple steps required- min A in posterior direction, unable to the L Goal status: IN PROGRESS 08/23/22    ASSESSMENT:  CLINICAL IMPRESSION: Activities initiated with focused, isolated strength to improve activity tolerance and coordination requiring cues for full ROM/effort and facilitation of sequence. Mobility training to improve repertoire of maneuvers from different start positions, e.g. supine to sidelying to prone to quadruped to improve functional mobility and fall recovery abilities. Tolerated tx session well and is observed ambulating with reciprocal step length throughout session today  OBJECTIVE IMPAIRMENTS:  Abnormal gait, decreased activity tolerance, decreased balance, decreased coordination, decreased knowledge of use of DME, difficulty walking, decreased ROM, decreased strength, dizziness, impaired flexibility, improper body mechanics, and postural dysfunction.   ACTIVITY LIMITATIONS: carrying, lifting, bending, standing, squatting, stairs, transfers, bed mobility, bathing, dressing, reach over head, and locomotion level  PARTICIPATION LIMITATIONS: meal prep, cleaning, laundry, shopping, community activity, and yard work  PERSONAL FACTORS: Age, Time since onset of injury/illness/exacerbation, and 3+ comorbidities: see medical hx  are also affecting patient's functional outcome.   REHAB POTENTIAL: Good  CLINICAL DECISION MAKING: Evolving/moderate complexity  EVALUATION COMPLEXITY: Moderate  PLAN:  PT FREQUENCY: 2x/week  PT DURATION: 8 weeks  PLANNED INTERVENTIONS: Therapeutic exercises, Therapeutic activity, Neuromuscular re-education, Balance training, Gait  training, Patient/Family education, Self Care, Joint mobilization, Stair training, Vestibular training, Canalith repositioning, DME instructions, Aquatic Therapy, Dry Needling, Electrical stimulation, Wheelchair mobility training, Spinal mobilization, Cryotherapy, Moist heat, Taping, and Manual therapy  PLAN FOR NEXT SESSION: Practice steps; work on improving gait speed and balance recovery   2:52 PM, 08/29/22 M. Sherlyn Lees, PT, DPT Physical Therapist- [place] Office Number: [phone]   [place] [facility] [address], [place], [place] [address] Phone # [phone] Fax # [phone]

## 2022-08-29 NOTE — Therapy (Signed)
OUTPATIENT OCCUPATIONAL THERAPY Treatment Session  Patient Name: [name] MRN: [id number] DOB:August 20, 1936, 87 y.o., male Today's Date: 08/29/2022  PCP: Donnajean Lopes, MD REFERRING PROVIDER: Star Age, MD     END OF SESSION:  OT End of Session - 08/29/22 1439     Visit Number 12    Number of Visits 17    Date for OT Re-Evaluation 09/13/22    Authorization Type Medicare A &B    OT Start Time 5465    OT Stop Time 6812    OT Time Calculation (min) 42 min                       Past Medical History:  Diagnosis Date   Ankylosing spondylitis ([place]) dx'd ~ 1974   Arthritis    "back" (08/10/2015)   BENIGN PROSTATIC HYPERTROPHY, WITH OBSTRUCTION 01/15/2010   Bleeding duodenal ulcer    Bleeding esophageal ulcer    Bleeding stomach ulcer    GERD 02/22/2008   History of blood transfusion 1988   "lost ~ 1/2 of my blood volume from multiple bleeding ulcers"   History of hiatal hernia    HYPERGLYCEMIA 11/18/2007   HYPERLIPIDEMIA 02/22/2008   HYPERTENSION, UNSPECIFIED 11/10/2009   Kidney stones    Osteoarthritis    c-spine   PEPTIC ULCER DISEASE 11/17/2007   Situational depression    "son died in [place] 06/21/2015"   TIA (transient ischemic attack)    "not that I know of in the past; they are trying to determine if I've had one today (08/10/2015)"   Past Surgical History:  Procedure Laterality Date   CATARACT EXTRACTION W/ INTRAOCULAR LENS  IMPLANT, BILATERAL Bilateral 2013   Patient Active Problem List   Diagnosis Date Noted   Ankylosing spondylitis of cervicothoracic region ([place]) 10/16/2016   TIA (transient ischemic attack) 08/10/2015   Carotid stenosis 08/04/2014   Hyperlipidemia 02/21/2012   BENIGN PROSTATIC HYPERTROPHY, WITH OBSTRUCTION 01/15/2010   Essential hypertension 11/10/2009   GERD 02/22/2008   NEPHROLITHIASIS, HX OF 11/17/2007    ONSET DATE: referral date 07/04/22   REFERRING DIAG: G20.C (ICD-10-CM) - Parkinsonism, unspecified Z91.81  (ICD-10-CM) - History of falling  THERAPY DIAG:  Muscle weakness (generalized)  Other symptoms and signs involving the nervous system  Unsteadiness on feet  Abnormal posture  Rationale for Evaluation and Treatment: Rehabilitation  SUBJECTIVE:   SUBJECTIVE STATEMENT: Pt's spouse reports doing "A little" workout at the gym yesterday.  Pt accompanied by: self  PERTINENT HISTORY: history of Parkinson's and ankylosing spondylitis   PRECAUTIONS: Fall  WEIGHT BEARING RESTRICTIONS: No  PAIN:  Are you having pain? No  FALLS: Has patient fallen in last 6 months? Yes. Number of falls 6 (3-4 since "big fall" in June 21, 2023)  LIVING ENVIRONMENT: Lives with: lives with their spouse Lives in: House/apartment Stairs: Yes: External: 2-3 steps; has had a ramp built just in last week Internal: steps to basement and steps to loft - however pt does not need to go up/down those at this time. Has following equipment at home: [employer] Rile - 2 wheeled, bed side commode, Ramped entry, and transport chair  PLOF: Independent  PATIENT GOALS: to be able to feel confident in any sort of movement to resume prior activities  OBJECTIVE:   HAND DOMINANCE: Right  ADLs: Overall ADLs: Prior to fall in Jun 20, 2022, pt Independent with all ADLs and IADLs without use of AE/DME, even completing shower in standing in walk-in shower. Transfers/ambulation related to ADLs: fluctuates with  mobility; now using RW for all mobility, Supervision with all mobility Eating: Mod I Grooming: wife will assist with opening items; otherwise setup and supervision due to balance UB Dressing: Min-mod assist, buttons/zippers are quite challenging LB Dressing: Mod assist, is able to don socks/shoes with increased time Toileting: Max assist Bathing: sponge baths since fall, max assist from wife at this time Tub Shower transfers: has walk-in shower, but does not feel safe in shower since fall in Oct Equipment: bed side  commode  IADLs: Meal Prep: since fall in October 2023, pt has been assisting with 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  Pt currently cutting foods for meal prep in sitting position. Medication management: wife assisting Handwriting:  TBA  MOBILITY STATUS: Needs Assist: CGA with mobility with RW and Hx of falls  POSTURE COMMENTS:  rounded shoulders and forward head  FUNCTIONAL OUTCOME MEASURES: Physical performance test: PPT #4 donning/doffing jacket: unable to complete due to LOB* 5 time sit > stand:  1:05.94 with up to mod assist and cues to scoot forward towards edge of chair and needing to push up from RW.  UPPER EXTREMITY ROM:  all movements are slowed, decreased shoulder flexion d/t ankylosing spondylitis  Active ROM Right eval Left eval  Shoulder flexion 90 90  Shoulder abduction    Shoulder adduction    Shoulder extension    Shoulder internal rotation [facility]  Shoulder external rotation [facility]  Elbow flexion [facility] WFL  Elbow extension [facility] WFL  Wrist flexion    Wrist extension    Wrist ulnar deviation    Wrist radial deviation    Wrist pronation    Wrist supination    (Blank rows = not tested)  UPPER EXTREMITY MMT:     MMT Right eval Left eval  Shoulder flexion 4/5 4/5  Shoulder abduction    Shoulder adduction    Shoulder extension    Shoulder internal rotation    Shoulder external rotation    Middle trapezius    Lower trapezius    Elbow flexion 4/5 4/5  Elbow extension 4/5 4/5  Wrist flexion    Wrist extension    Wrist ulnar deviation    Wrist radial deviation    Wrist pronation    Wrist supination    (Blank rows = not tested)  HAND FUNCTION: TBD  COORDINATION: Slowed movements, TBA at future session  COGNITION: Overall cognitive status:  Pt's spouse reports that his thinking seems slower, but attributes it to stress from all issues presenting s/p fall in October  VISION: Subjective report: glasses broke with recent fall Baseline vision: Wears  glasses all the time Visual history: cataracts  VISION ASSESSMENT: Not tested  Patient has difficulty with following activities due to following visual impairments: currently difficult to assess as pt does not have glasses with him, as they broke during fall in October 2023.  OBSERVATIONS: Pt with slowed response time and movements during transfers to/from treatment space and during assessment.   TODAY'S TREATMENT:    08/29/22 NMR exercises in supine for increased scapular support and trunk elongation to allow for increased shoulder ROM in gravity-minimized position.  Engaged in shoulder flexion with overhead reaching and chest press while holding ball to facilitate increased symmetry.  OT providing mod verbal cues, demonstration, and tactile cues for target to facilitate increased ROM. Transitioned to sitting upright with focus on carryover of technique in upright sitting.  Pt returns to posterior pelvic tilt and rounded back, benefiting from tactile cues from  OT to facilitate upright sitting posture.  Pt benefits from tactile cues, increased time for processing, and repetition for carryover of all movements (in supine and sitting). Supine <> sit: OT providing mod cues for large amplitude leg swing when transitioning from sitting to supine and to orient self straight on mat in supine.  OT providing mod cues for technique with supine > sit going through sideline for increased amplitude and fluidity of movement.    08/27/22 Shower: Pt reports that he does not plan to shower until he gets the appropriate equipment.  Spouse reports increased time to get dressed is impacting their ability to get out to the store to look at recommended equipment.   Bag/towel exercises: engaged in [place] reaching overhead and back to simulate donning/doffing jacket as well as reaching behind shoulder with RUE and then reach behind back with left hand to pass bag from right hand to left hand, then switch sides.  Pt requiring  mod multimodal cues for sequencing, benefiting from demonstration and intermittent tactile cues at hip or trunk to facilitate upright sitting posture.  OT placed mirror infront of pt to further facilitate upright sitting posture with cues to may eye contact with self with moving towel/bag behind head and shoulders to increase upright posture.   Large amplitude reaching: engaged in reaching behind self and in front with focus on large amplitude/full opening of arms between sets.  OT providing intermittent tactile cues and mod verbal cues for upright posture and technique.  Utilized ball for this task to carry over to more familiar task from exercise class.    08/23/22 5x sit > stand: 41.75 sec.  Pt still requires multiple attempts 1-2 before coming to full standing with each stand.  Pt demonstrating improvements in carryover of anterior weight shift. Jacket: 57.25 sec.  Pt still with difficulty pulling jacket over shoulders once on BUE (getting caught at midback). Engaged in simulated UB dressing with hospital gown.  OT providing verbal cues and demonstration for hand placement to increase positioning of sleeve on shoulder to allow for decreased reach behind self to obtain other sleeve.  Utilized [employer], engineering for visual feedback for carryover of technique.  OT providing demonstration and verbal cues for large amplitude movement and cues for hand placement to increase success with donning/doffing shirt and jacket.     PATIENT EDUCATION: Education details: ongoing condition specific education.   Person educated: Patient and Spouse Education method: Explanation, Demonstration, and Verbal cues Education comprehension: verbalized understanding and needs further education  HOME EXERCISE PROGRAM: TBD   GOALS: Goals reviewed with patient? Yes  SHORT TERM GOALS: Target date: 08/16/22  Pt and wife will report understanding of adaptive techniques and/or potential DME/AE needs to increase ease, safety, and  independence w/ ADLs. Baseline: Goal status: MET - spouse looking in to shower chair options to return to bathing at shower level on 08/14/22  2.  Pt will perform dynamic standing task for 5 mins as needed for simple IADLs w/o LOB using DME and/or countertop support prn  Baseline: decreased standing tolerance Goal status: MET - 08/14/22  3.  Pt will demonstrate improved ease with sit > stand to get up from toilet or other seating options in home by improving score on sit > stand to decreased fall risk. Baseline: 1:05.94 Goal status: MET - 43.94 sec on 08/14/22  4.  Pt will complete UB dressing (except clothing fasteners) with supervision. Baseline: wife assists with buttons occasionally prior to fall Goal status: NOT MET -  wife still needing to assist some days, but not other days   LONG TERM GOALS: Target date: 09/13/22  Pt will demonstrate shower transfers with DME PRN at supervision level. Baseline: currently sponge bathing due to fearfulness of showering, but wants to return to showers.   Goal status: IN PROGRESS  2.  Pt will report increased participation in IADLs (such as cooking) to increase independence with functional IADLs.  Baseline:  Goal status: IN PROGRESS  3.  Pt will complete UB dressing, to include clothing fasteners, at Mod I level with use of AE and/or alternative strategies PRN. Baseline:  Goal status: IN PROGRESS  4.  Pt will complete LB dressing with Supervision/setup with use of AE and/or alternative strategies PRN. Baseline:  Goal status: IN PROGRESS  5.  Pt will demonstrate improved dynamic standing balance for 15 mins as needed to engage in ADLs/IADLs with increased safety/endurance.   Baseline:  Goal status: IN PROGRESS  6. Pt will demonstrate increased ease with dressing as evidenced by decreasing PPT#4(don/ doff jacket) by 10 secs or more.  Baseline: 57.25 sec on 08/23/22  Goal status: IN PROGRESS  ASSESSMENT:  CLINICAL IMPRESSION: Pt  continues to require increased time for processing and initiation of movements.  Pt benefits from hand over hand, demonstration, and verbal cues for exercises for trunk elongation and shoulder ROM to aide in UB dressing.  OT providing verbal and tactile cues for increased feedback on body positioning/posture. Tactile cues utilized for upright trunk posture as pt easily distracted by increased verbal cues.  PERFORMANCE DEFICITS: in functional skills including ADLs, IADLs, ROM, strength, pain, flexibility, Fine motor control, Gross motor control, mobility, balance, body mechanics, endurance, decreased knowledge of precautions, decreased knowledge of use of DME, and UE functional use, cognitive skills including attention, memory, safety awareness, sequencing, and thought, and psychosocial skills including environmental adaptation and routines and behaviors.   IMPAIRMENTS: are limiting patient from ADLs and IADLs.   CO-MORBIDITIES: may have co-morbidities  that affects occupational performance. Patient will benefit from skilled OT to address above impairments and improve overall function.  MODIFICATION OR ASSISTANCE TO COMPLETE EVALUATION: Min-Moderate modification of tasks or assist with assess necessary to complete an evaluation.  OT OCCUPATIONAL PROFILE AND HISTORY: Detailed assessment: Review of records and additional review of physical, cognitive, psychosocial history related to current functional performance.  CLINICAL DECISION MAKING: Moderate - several treatment options, min-mod task modification necessary  REHAB POTENTIAL: Good  EVALUATION COMPLEXITY: Moderate    PLAN:  OT FREQUENCY: 2x/week  OT DURATION: 8 weeks  PLANNED INTERVENTIONS: self care/ADL training, therapeutic exercise, therapeutic activity, neuromuscular re-education, manual therapy, balance training, functional mobility training, ultrasound, biofeedback, compression bandaging, moist heat, cryotherapy, patient/family  education, cognitive remediation/compensation, visual/perceptual remediation/compensation, psychosocial skills training, energy conservation, coping strategies training, and DME and/or AE instructions  RECOMMENDED OTHER SERVICES: N/A  CONSULTED AND AGREED WITH PLAN OF CARE: Patient and family member/caregiver  PLAN FOR NEXT SESSION:  BUE ROM for dressing with focus on large amplitude and posture; Reiterate DME/AE for bathroom transfers and safety/independence with ADLs/IADLs. Practice shower transfers with stepping over shower ledge and continue to discuss DME/AE for bathroom transfers and dressing tasks.  Dynamic standing balance/endurance as well as energy conservation strategies.  Pt to report more concerning/problem areas to focus remaining sessions on.   Simonne Come, OTR/L 08/29/2022, 2:39 PM

## 2022-09-03 ENCOUNTER — Encounter: Payer: Medicare Other | Admitting: Occupational Therapy

## 2022-09-03 ENCOUNTER — Ambulatory Visit: Payer: Medicare Other

## 2022-09-05 ENCOUNTER — Ambulatory Visit: Payer: Medicare Other

## 2022-09-05 ENCOUNTER — Ambulatory Visit: Payer: Medicare Other | Admitting: Occupational Therapy

## 2022-09-05 DIAGNOSIS — R262 Difficulty in walking, not elsewhere classified: Secondary | ICD-10-CM

## 2022-09-05 DIAGNOSIS — R29818 Other symptoms and signs involving the nervous system: Secondary | ICD-10-CM

## 2022-09-05 DIAGNOSIS — M6281 Muscle weakness (generalized): Secondary | ICD-10-CM

## 2022-09-05 DIAGNOSIS — B351 Tinea unguium: Secondary | ICD-10-CM | POA: Diagnosis not present

## 2022-09-05 DIAGNOSIS — R293 Abnormal posture: Secondary | ICD-10-CM | POA: Diagnosis not present

## 2022-09-05 DIAGNOSIS — R2681 Unsteadiness on feet: Secondary | ICD-10-CM | POA: Diagnosis not present

## 2022-09-05 DIAGNOSIS — R278 Other lack of coordination: Secondary | ICD-10-CM | POA: Diagnosis not present

## 2022-09-05 DIAGNOSIS — M79676 Pain in unspecified toe(s): Secondary | ICD-10-CM | POA: Diagnosis not present

## 2022-09-05 DIAGNOSIS — R2689 Other abnormalities of gait and mobility: Secondary | ICD-10-CM

## 2022-09-05 NOTE — Therapy (Signed)
OUTPATIENT PHYSICAL THERAPY NEURO TREATMENT   Patient Name: [name] MRN: [id number] DOB:06-20-1936, 87 y.o., male Today's Date: 09/05/2022   PCP: Donnajean Lopes, MD REFERRING PROVIDER: Star Age, MD     END OF SESSION:  PT End of Session - 09/05/22 1451     Visit Number 13    Number of Visits 16    Date for PT Re-Evaluation 09/10/22    Authorization Type Medicare    Progress Note Due on Visit 60    PT Start Time 1445    PT Stop Time [id number]    PT Time Calculation (min) 45 min    Equipment Utilized During Treatment Gait belt    Activity Tolerance Patient tolerated treatment well    Behavior During Therapy WFL for tasks assessed/performed               Past Medical History:  Diagnosis Date   Ankylosing spondylitis ([place]) dx'd ~ 1974   Arthritis    "back" (08/10/2015)   BENIGN PROSTATIC HYPERTROPHY, WITH OBSTRUCTION 01/15/2010   Bleeding duodenal ulcer    Bleeding esophageal ulcer    Bleeding stomach ulcer    GERD 02/22/2008   History of blood transfusion 1988   "lost ~ 1/2 of my blood volume from multiple bleeding ulcers"   History of hiatal hernia    HYPERGLYCEMIA 11/18/2007   HYPERLIPIDEMIA 02/22/2008   HYPERTENSION, UNSPECIFIED 11/10/2009   Kidney stones    Osteoarthritis    c-spine   PEPTIC ULCER DISEASE 11/17/2007   Situational depression    "son died in [place] 06-27-2015"   TIA (transient ischemic attack)    "not that I know of in the past; they are trying to determine if I've had one today (08/10/2015)"   Past Surgical History:  Procedure Laterality Date   CATARACT EXTRACTION W/ INTRAOCULAR LENS  IMPLANT, BILATERAL Bilateral 2013   Patient Active Problem List   Diagnosis Date Noted   Ankylosing spondylitis of cervicothoracic region ([place]) 10/16/2016   TIA (transient ischemic attack) 08/10/2015   Carotid stenosis 08/04/2014   Hyperlipidemia 02/21/2012   BENIGN PROSTATIC HYPERTROPHY, WITH OBSTRUCTION 01/15/2010   Essential hypertension 11/10/2009    GERD 02/22/2008   NEPHROLITHIASIS, HX OF 11/17/2007    ONSET DATE: 06-26-2022, PD for 4 years  REFERRING DIAG: Z91.81 (ICD-10-CM) - History of recent fall G20.C (ICD-10-CM) - Primary parkinsonism  THERAPY DIAG:  Muscle weakness (generalized)  Other symptoms and signs involving the nervous system  Unsteadiness on feet  Abnormal posture  Difficulty in walking, not elsewhere classified  Other abnormalities of gait and mobility  Rationale for Evaluation and Treatment: Rehabilitation  SUBJECTIVE:  SUBJECTIVE STATEMENT: Feeling pretty good today, good walking day  Pt accompanied by: significant other  PERTINENT HISTORY: PD, complex medical history of peptic ulcer disease with history of upper GI bleed, TIA, depression, degenerative disc disease in the neck, osteoarthritis, kidney stones, hyperlipidemia, hypertension, history of hiatal hernia, BPH, arthritis, and ankylosing spondylitis, and parkinsonism  PAIN:  Are you having pain? No PRECAUTIONS: None  WEIGHT BEARING RESTRICTIONS: No  FALLS: Has patient fallen in last 6 months? Yes. Number of falls "several"  LIVING ENVIRONMENT: Lives with: lives with their spouse Lives in: House/apartment Stairs:  stairs inside home to loft, ramp to enter/exit home Has following equipment at home: Gilford Rile - 2 wheeled, since the fall in October  PLOF: [place]: return to PLOF  OBJECTIVE:    TODAY'S TREATMENT: 09/05/22 Activity Comments  NU-step resistance levels x 9 min Heavy:slow, light:fast; 1:1 to improve response to varied   Stepping strategy -lateral over half roll -forwrad/backward over half roll  Standing on foam -EO/EC 2x30 sec -trunk twists 2x10 -alt taps BUE, single UE support, no UE support  Strength training  -seated cable row 4x12 reps 25# -standing hip abd 2x12 reps  Backwards walk 2x25 ft CGA          PATIENT EDUCATION: Education details: discussion on progress towards goals and remaining impairments  Person educated: Patient Education method: Explanation, Demonstration, Tactile cues, and Verbal cues Education comprehension: verbalized understanding    HOME EXERCISE PROGRAM Last updated: 07/24/22 Access Code: [email or web address] URL: [email or web address] Date: 07/24/2022 Prepared by: [facility]  Program Notes perform with gait belt and wife's supervision  Exercises - Standing Balance in Corner  - 1 x daily - 7 x weekly - 3 sets - 30 sec hold - Narrow Stance with Counter Support  - 1 x daily - 5 x weekly - 2 sets - 10 reps - 30 sec hold - Standing Balance in Corner with Eyes Closed  - 1 x daily - 5 x weekly - 2 sets - 10 reps - 30 sec hold - Sit to Stand with Counter Support  - 1 x daily - 5 x weekly - 2 sets - 5-10 reps - Supine Lower Trunk Rotation  - 1 x daily - 7 x weekly - 3 sets - 10 reps - Supine Bridge  - 1 x daily - 7 x weekly - 3 sets - 10 reps - Thoracic Extension Mobilization on Foam Roll  - 1 x daily - 7 x weekly - 3 sets - 10 reps - 3 sec hold - Seated Shoulder Horizontal Abduction with Resistance  - 1 x daily - 7 x weekly - 3 sets - 10 reps   Below measures were taken at time of initial evaluation unless otherwise specified:   DIAGNOSTIC FINDINGS: no intracranial or osseous injuries  COGNITION: Overall cognitive status: Within functional limits for tasks assessed   SENSATION: WFL  COORDINATION: Alternating movements impaired, right ankle limitations pre-morbid from hx of crush injury in childhood Heel to shin mildly impaired right > left Finger to nose WNL Finger opposition: unable to perform bilaterally  EDEMA:  none  MUSCLE TONE: NT  MUSCLE LENGTH: Presents with bilateral knee flexion contractures,  approx 5-10 degrees  DTRs:    POSTURE: rounded shoulders, forward head, and increased thoracic kyphosis  LOWER EXTREMITY ROM:     Active  Right Eval Left Eval  Hip flexion    Hip extension    Hip abduction  Hip adduction    Hip internal rotation    Hip external rotation    Knee flexion    Knee extension -10 -10  Ankle dorsiflexion 5 10  Ankle plantarflexion    Ankle inversion    Ankle eversion     (Blank rows = not tested)  LOWER EXTREMITY MMT:  assessed in sitting  MMT Right Eval Left Eval  Hip flexion 4 4  Hip extension    Hip abduction 3+ 3+  Hip adduction 4- 4-  Hip internal rotation    Hip external rotation    Knee flexion 4 4  Knee extension 4- 4-  Ankle dorsiflexion 2+ 3+  Ankle plantarflexion    Ankle inversion    Ankle eversion    (Blank rows = not tested)  BED MOBILITY:  Mod A  TRANSFERS: Assistive device utilized: Environmental consultant - 2 wheeled  Sit to stand: CGA and Min A Stand to sit: SBA Chair to chair: CGA and Min A Floor:  DNT  RAMP:  NT  CURB:  Level of Assistance: CGA Assistive device utilized: Environmental consultant - 2 wheeled Curb Comments:   STAIRS: NT  GAIT: Gait pattern:  right knee extensor thrust in stance, shuffling, and trunk flexed, deficits during turning Distance walked:  Assistive device utilized: Environmental consultant - 2 wheeled Level of assistance: CGA and Min A Comments: unsteady, deficits during turns  FUNCTIONAL TESTS:  5 times sit to stand: unable to complete, retro-pulsion Timed up and go (TUG): 29 sec Push and release test (# of steps): Anterior: absent  Posterior: absent  M-CTSIB  Condition 1: Firm Surface, EO 30 Sec, Mild Sway  Condition 2: Firm Surface, EC 30 Sec, Mild and Moderate Sway  Condition 3: Foam Surface, EO 0 Sec,  unable  Sway  Condition 4: Foam Surface, EC - Sec,  -  Sway     VESTIBULAR ASSESSMENT   GENERAL OBSERVATION: right eyebrow ptosis?    SYMPTOM BEHAVIOR:   Subjective history: fall and strike to anterior  right forehead   Non-Vestibular symptoms: denies   Type of dizziness: Imbalance (Disequilibrium) and Lightheadedness/Faint   Frequency: "depends"   Duration: "whenever"   Aggravating factors: No known aggravating factors   Relieving factors: no known relieving factors   Progression of symptoms: unchanged   OCULOMOTOR EXAM:   Ocular Alignment: right eye ptosis   Ocular ROM: No Limitations   Spontaneous Nystagmus: absent   Gaze-Induced Nystagmus: age appropriate nystagmus at end range   Smooth Pursuits: saccades   Saccades: hypometric/undershoots, extra eye movements, and slow   Convergence/Divergence: 10 inches      VESTIBULAR - OCULAR REFLEX:    Slow VOR: Comment: difficulty in tracking   VOR Cancellation: Comment: NT   Head-Impulse Test: NT   Dynamic Visual Acuity: Not able to be assessed    POSITIONAL TESTING: unable to test due to time limitation    MOTION SENSITIVITY:    Motion Sensitivity Quotient  Intensity: 0 = none, 1 = Lightheaded, 2 = Mild, 3 = Moderate, 4 = Severe, 5 = Vomiting  Intensity  1. Sitting to supine   2. Supine to L side   3. Supine to R side   4. Supine to sitting   5. L Hallpike-Dix   6. Up from L    7. R Hallpike-Dix   8. Up from R    9. Sitting, head  tipped to L knee   10. Head up from L  knee   11. Sitting, head  tipped  to R knee   12. Head up from R  knee   13. Sitting head turns x5   14.Sitting head nods x5   15. In stance, 180  turn to L    16. In stance, 180  turn to R     OTHOSTATICS: not done  FUNCTIONAL GAIT:    VESTIBULAR TREATMENT:  Canalith Repositioning:   TBD Gaze Adaptation:   TBD Habituation:   TBD Other:  PATIENT SURVEYS:    PATIENT EDUCATION: Education details: assessment findings/interpretations Person educated: Patient and Spouse Education method: Explanation Education comprehension: verbalized understanding   GOALS: Goals reviewed with patient? Yes  SHORT TERM GOALS: Target date:  08/13/2022    Patient will perform HEP with family/caregiver supervision for improved strength, balance, transfers, gaze adaptation, habituation, balance, and gait  Baseline: Goal status: MET   2.  Demonstrate functional transfers with supervision level with or without AD to improve safety with functional mobility Baseline: CGA-min A; supervision/verbal cueing 08/23/22 Goal status: MET 08/23/22  3.  Manifest improved BLE strength as evidenced by ability to complete 5xSTS test in 25 sec to improve safety with mobility Baseline: unable; 20.51 sec 08/23/22 Goal status: MET 08/23/22   LONG TERM GOALS: Target date: 09/10/2022    Demonstrate reduce risk for falls per time 15 sec TUG test with least restrictive AD Baseline: 29 sec w/ RW; 31.11 sec with RW 08/23/22 Goal status: IN PROGRESS 08/23/22  2.  Demo ability to transfer and ambulate over various surfaces x 300 ft with modified independence to reduce level of assistance from caregiver Baseline: CGA-min A w/ RW; completed with CGA 08/23/22 Goal status: MET 08/23/22  3.  Demonstrate improved reactive balance as evidenced by ability to enact 2-3 steps with push-release test to facilitate righting reactions and reduce risk for falls Baseline: Absent, LOB; multiple steps required- min A in posterior direction, unable to the L Goal status: IN PROGRESS 08/23/22    ASSESSMENT:  CLINICAL IMPRESSION: Progressed activities to promote large amplitude movements and facilitate righting reactions and large step length to promote clearing obstacles and reduce risk for tripping hazards. Work on compliant surfaces and eyes closed conditions for improved safety in dark environs. Increased resistance for upper body rowing motion to improve upper back strength and improved postural stability. Difficulty with retrowalking utilizing small steps.   OBJECTIVE IMPAIRMENTS: Abnormal gait, decreased activity tolerance, decreased balance, decreased  coordination, decreased knowledge of use of DME, difficulty walking, decreased ROM, decreased strength, dizziness, impaired flexibility, improper body mechanics, and postural dysfunction.   ACTIVITY LIMITATIONS: carrying, lifting, bending, standing, squatting, stairs, transfers, bed mobility, bathing, dressing, reach over head, and locomotion level  PARTICIPATION LIMITATIONS: meal prep, cleaning, laundry, shopping, community activity, and yard work  PERSONAL FACTORS: Age, Time since onset of injury/illness/exacerbation, and 3+ comorbidities: see medical hx  are also affecting patient's functional outcome.   REHAB POTENTIAL: Good  CLINICAL DECISION MAKING: Evolving/moderate complexity  EVALUATION COMPLEXITY: Moderate  PLAN:  PT FREQUENCY: 2x/week  PT DURATION: 8 weeks  PLANNED INTERVENTIONS: Therapeutic exercises, Therapeutic activity, Neuromuscular re-education, Balance training, Gait training, Patient/Family education, Self Care, Joint mobilization, Stair training, Vestibular training, Canalith repositioning, DME instructions, Aquatic Therapy, Dry Needling, Electrical stimulation, Wheelchair mobility training, Spinal mobilization, Cryotherapy, Moist heat, Taping, and Manual therapy  PLAN FOR NEXT SESSION: Practice steps; work on improving gait speed and balance recovery   2:51 PM, 09/05/22 M. Sherlyn Lees, PT, DPT Physical Therapist- [place] Office Number: [phone]   [facility]  [place] [address], [place], [place] [address] Phone # [phone] Fax # [phone]
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
## 2022-09-10 ENCOUNTER — Ambulatory Visit: Payer: Medicare Other

## 2022-09-10 ENCOUNTER — Ambulatory Visit: Payer: Medicare Other | Admitting: Occupational Therapy

## 2022-09-10 DIAGNOSIS — R2689 Other abnormalities of gait and mobility: Secondary | ICD-10-CM | POA: Diagnosis not present

## 2022-09-10 DIAGNOSIS — R29818 Other symptoms and signs involving the nervous system: Secondary | ICD-10-CM | POA: Diagnosis not present

## 2022-09-10 DIAGNOSIS — R2681 Unsteadiness on feet: Secondary | ICD-10-CM | POA: Diagnosis not present

## 2022-09-10 DIAGNOSIS — M6281 Muscle weakness (generalized): Secondary | ICD-10-CM | POA: Diagnosis not present

## 2022-09-10 DIAGNOSIS — R293 Abnormal posture: Secondary | ICD-10-CM

## 2022-09-10 DIAGNOSIS — R278 Other lack of coordination: Secondary | ICD-10-CM

## 2022-09-10 DIAGNOSIS — R262 Difficulty in walking, not elsewhere classified: Secondary | ICD-10-CM

## 2022-09-10 NOTE — Therapy (Signed)
OUTPATIENT OCCUPATIONAL THERAPY Treatment Session  Patient Name: [name] MRN: [id number] DOB:09/01/1935, 87 y.o., male Today's Date: 09/10/2022  PCP: Donnajean Lopes, MD REFERRING PROVIDER: Star Age, MD     END OF SESSION:  OT End of Session - 09/10/22 1559     Visit Number 14    Number of Visits 17    Date for OT Re-Evaluation 09/13/22    Authorization Type Medicare A &B    OT Start Time 2878   pt in bathroom at beginning of session   OT Stop Time 6767    OT Time Calculation (min) 40 min                         Past Medical History:  Diagnosis Date   Ankylosing spondylitis ([place]) dx'd ~ 1974   Arthritis    "back" (08/10/2015)   BENIGN PROSTATIC HYPERTROPHY, WITH OBSTRUCTION 01/15/2010   Bleeding duodenal ulcer    Bleeding esophageal ulcer    Bleeding stomach ulcer    GERD 02/22/2008   History of blood transfusion 1988   "lost ~ 1/2 of my blood volume from multiple bleeding ulcers"   History of hiatal hernia    HYPERGLYCEMIA 11/18/2007   HYPERLIPIDEMIA 02/22/2008   HYPERTENSION, UNSPECIFIED 11/10/2009   Kidney stones    Osteoarthritis    c-spine   PEPTIC ULCER DISEASE 11/17/2007   Situational depression    "son died in [place] 06/27/2015"   TIA (transient ischemic attack)    "not that I know of in the past; they are trying to determine if I've had one today (08/10/2015)"   Past Surgical History:  Procedure Laterality Date   CATARACT EXTRACTION W/ INTRAOCULAR LENS  IMPLANT, BILATERAL Bilateral 2013   Patient Active Problem List   Diagnosis Date Noted   Ankylosing spondylitis of cervicothoracic region ([place]) 10/16/2016   TIA (transient ischemic attack) 08/10/2015   Carotid stenosis 08/04/2014   Hyperlipidemia 02/21/2012   BENIGN PROSTATIC HYPERTROPHY, WITH OBSTRUCTION 01/15/2010   Essential hypertension 11/10/2009   GERD 02/22/2008   NEPHROLITHIASIS, HX OF 11/17/2007    ONSET DATE: referral date 07/04/22   REFERRING DIAG: G20.C  (ICD-10-CM) - Parkinsonism, unspecified Z91.81 (ICD-10-CM) - History of falling  THERAPY DIAG:  Other symptoms and signs involving the nervous system  Other lack of coordination  Other abnormalities of gait and mobility  Abnormal posture  Unsteadiness on feet  Muscle weakness (generalized)  Rationale for Evaluation and Treatment: Rehabilitation  SUBJECTIVE:   SUBJECTIVE STATEMENT: Pt's spouse asking about recommendations for pill box options.  Pt accompanied by: self and significant other  PERTINENT HISTORY: history of Parkinson's and ankylosing spondylitis   PRECAUTIONS: Fall  WEIGHT BEARING RESTRICTIONS: No  PAIN:  Are you having pain? No  FALLS: Has patient fallen in last 6 months? Yes. Number of falls 6 (3-4 since "big fall" in 2023-06-27)  LIVING ENVIRONMENT: Lives with: lives with their spouse Lives in: House/apartment Stairs: Yes: External: 2-3 steps; has had a ramp built just in last week Internal: steps to basement and steps to loft - however pt does not need to go up/down those at this time. Has following equipment at home: Gil[employer] Rile - 2 wheeled, bed side commode, Ramped entry, and transport chair  PLOF: Independent  PATIENT GOALS: to be able to feel confident in any sort of movement to resume prior activities  OBJECTIVE:   HAND DOMINANCE: Right  ADLs: Overall ADLs: Prior to fall in 2022-06-26, pt Independent  with all ADLs and IADLs without use of AE/DME, even completing shower in standing in walk-in shower. Transfers/ambulation related to ADLs: fluctuates with mobility; now using RW for all mobility, Supervision with all mobility Eating: Mod I Grooming: wife will assist with opening items; otherwise setup and supervision due to balance UB Dressing: Min-mod assist, buttons/zippers are quite challenging LB Dressing: Mod assist, is able to don socks/shoes with increased time Toileting: Max assist Bathing: sponge baths since fall, max assist from wife at this  time Tub Shower transfers: has walk-in shower, but does not feel safe in shower since fall in Oct Equipment: bed side commode  IADLs: Meal Prep: since fall in October 2023, pt has been assisting with 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  Pt currently cutting foods for meal prep in sitting position. Medication management: wife assisting Handwriting:  TBA  MOBILITY STATUS: Needs Assist: CGA with mobility with RW and Hx of falls  POSTURE COMMENTS:  rounded shoulders and forward head  FUNCTIONAL OUTCOME MEASURES: Physical performance test: PPT #4 donning/doffing jacket: unable to complete due to LOB* 5 time sit > stand:  1:05.94 with up to mod assist and cues to scoot forward towards edge of chair and needing to push up from RW.  UPPER EXTREMITY ROM:  all movements are slowed, decreased shoulder flexion d/t ankylosing spondylitis  Active ROM Right eval Left eval  Shoulder flexion 90 90  Shoulder abduction    Shoulder adduction    Shoulder extension    Shoulder internal rotation [facility]  Shoulder external rotation [facility]  Elbow flexion [facility] WFL  Elbow extension [facility] WFL  Wrist flexion    Wrist extension    Wrist ulnar deviation    Wrist radial deviation    Wrist pronation    Wrist supination    (Blank rows = not tested)  UPPER EXTREMITY MMT:     MMT Right eval Left eval  Shoulder flexion 4/5 4/5  Shoulder abduction    Shoulder adduction    Shoulder extension    Shoulder internal rotation    Shoulder external rotation    Middle trapezius    Lower trapezius    Elbow flexion 4/5 4/5  Elbow extension 4/5 4/5  Wrist flexion    Wrist extension    Wrist ulnar deviation    Wrist radial deviation    Wrist pronation    Wrist supination    (Blank rows = not tested)  HAND FUNCTION: TBD  COORDINATION: Slowed movements, TBA at future session  COGNITION: Overall cognitive status:  Pt's spouse reports that his thinking seems slower, but attributes it to stress from all  issues presenting s/p fall in October  VISION: Subjective report: glasses broke with recent fall Baseline vision: Wears glasses all the time Visual history: cataracts  VISION ASSESSMENT: Not tested  Patient has difficulty with following activities due to following visual impairments: currently difficult to assess as pt does not have glasses with him, as they broke during fall in October 2023.  OBSERVATIONS: Pt with slowed response time and movements during transfers to/from treatment space and during assessment.   TODAY'S TREATMENT:    09/10/22 Medication management: discussed variety of pill box styles with focus on safe closure and pt ability to open.  Discussed push top vs flip top as well as AM/PM vs one spot pill boxes.  Pt trialed the styles in clinic and OT showing additional options while focusing on ease but security of open/close feature. Buttons: pt continues to  demonstrate difficulty with managing buttons.  Pt even reports pants buttons to be challenging which can be an issue with urgency with toileting needs.  Pt demonstrating improved sequencing and technique with managing buttons on own shirt vs shirt placed in front.  Pt demonstrating carryover of education of hand placement and technique when buttoning shirt.  OT demonstrating hand placement and simulating fastening/unfastening button on pants with focus on pull at button hole to open hole and then pushing button down through hole.  Pt benefited from additional cues and demonstration and will benefit from practice at home.   09/05/22 UE exercises: Engaged in UE exercises in sitting at mirror for visual feedback for more upright posture, as able.  Engaged in reaching around back with 2# medicine ball to pass from hand to hand.  Pt able to complete clockwise and counter clockwise with min cues for upright posture.  Attempted to reach around neck as above, however unable to complete, therefore transitioned to reaching up over head  with focus on shoulder flexion and elbow extension. Shower transfers: Pt's spouse reports purchasing shower chair and placing it in shower.  They have completed 2 trial runs with transfers in/out of walk-in shower with pt able to complete with increased ability to step over shower ledge without shoes on.  Pt reports taking a shower yesterday and it went well.  They plan to purchase a grab bar for the shower to aid in transfer in/out. Coordination: w/ LUE/RUE and BUEs as appropriate, including: rotating small ball w/ fingertips, tossing/catching ball, picking up various small objects/coins, picking up coins and stacking them.  Pt with difficulty picking up coins from table, therefore educated pt and spouse on downgrading task to attempting with buttons or checker pieces for increased thickness.  Discussed carryover of ball exercises to larger amplitude and postural control.   08/29/22 NMR exercises in supine for increased scapular support and trunk elongation to allow for increased shoulder ROM in gravity-minimized position.  Engaged in shoulder flexion with overhead reaching and chest press while holding ball to facilitate increased symmetry.  OT providing mod verbal cues, demonstration, and tactile cues for target to facilitate increased ROM. Transitioned to sitting upright with focus on carryover of technique in upright sitting.  Pt returns to posterior pelvic tilt and rounded back, benefiting from tactile cues from OT to facilitate upright sitting posture.  Pt benefits from tactile cues, increased time for processing, and repetition for carryover of all movements (in supine and sitting). Supine <> sit: OT providing mod cues for large amplitude leg swing when transitioning from sitting to supine and to orient self straight on mat in supine.  OT providing mod cues for technique with supine > sit going through sideline for increased amplitude and fluidity of movement.   PATIENT EDUCATION: Education  details: ongoing condition specific education.   Person educated: Patient and Spouse Education method: Explanation, Demonstration, and Verbal cues Education comprehension: verbalized understanding and needs further education  HOME EXERCISE PROGRAM: TBD   GOALS: Goals reviewed with patient? Yes  SHORT TERM GOALS: Target date: 08/16/22  Pt and wife will report understanding of adaptive techniques and/or potential DME/AE needs to increase ease, safety, and independence w/ ADLs. Baseline: Goal status: MET - spouse looking in to shower chair options to return to bathing at shower level on 08/14/22  2.  Pt will perform dynamic standing task for 5 mins as needed for simple IADLs w/o LOB using DME and/or countertop support prn  Baseline: decreased standing tolerance  Goal status: MET - 08/14/22  3.  Pt will demonstrate improved ease with sit > stand to get up from toilet or other seating options in home by improving score on sit > stand to decreased fall risk. Baseline: 1:05.94 Goal status: MET - 43.94 sec on 08/14/22  4.  Pt will complete UB dressing (except clothing fasteners) with supervision. Baseline: wife assists with buttons occasionally prior to fall Goal status: NOT MET - wife still needing to assist some days, but not other days   LONG TERM GOALS: Target date: 09/13/22  Pt will demonstrate shower transfers with DME PRN at supervision level. Baseline: currently sponge bathing due to fearfulness of showering, but wants to return to showers.   Goal status: IN PROGRESS - HHA with transfers out of walk-in shower 09/10/22  2.  Pt will report increased participation in IADLs (such as cooking) to increase independence with functional IADLs.  Baseline:  Goal status: MET - requires increased time, but he is engaging in more 09/10/22  3.  Pt will complete UB dressing, to include clothing fasteners, at Mod I level with use of AE and/or alternative strategies PRN. Baseline:  Goal status:  IN PROGRESS  4.  Pt will complete LB dressing with Supervision/setup with use of AE and/or alternative strategies PRN. Baseline:  Goal status: IN PROGRESS  5.  Pt will demonstrate improved dynamic standing balance for 15 mins as needed to engage in ADLs/IADLs with increased safety/endurance.   Baseline:  Goal status: IN PROGRESS  6. Pt will demonstrate increased ease with dressing as evidenced by decreasing PPT#4(don/ doff jacket) by 10 secs or more.  Baseline: 57.25 sec on 08/23/22  Goal status: IN PROGRESS  ASSESSMENT:  CLINICAL IMPRESSION: Discussed approaching end of certification, with pt and spouse expressing desire to continue on with therapy if warranted.  OT encouraged pt and spouse to observe daily tasks/routines at home and report back next visit areas that are still of concern.  Pt continues to report and demonstrate difficulty with buttons, especially pants buttons.  Pt also with difficulty opening current pill containers, therefore receptive to options with plan to pursue other options based on suggestions during session.   PERFORMANCE DEFICITS: in functional skills including ADLs, IADLs, ROM, strength, pain, flexibility, Fine motor control, Gross motor control, mobility, balance, body mechanics, endurance, decreased knowledge of precautions, decreased knowledge of use of DME, and UE functional use, cognitive skills including attention, memory, safety awareness, sequencing, and thought, and psychosocial skills including environmental adaptation and routines and behaviors.   IMPAIRMENTS: are limiting patient from ADLs and IADLs.   CO-MORBIDITIES: may have co-morbidities  that affects occupational performance. Patient will benefit from skilled OT to address above impairments and improve overall function.  MODIFICATION OR ASSISTANCE TO COMPLETE EVALUATION: Min-Moderate modification of tasks or assist with assess necessary to complete an evaluation.  OT OCCUPATIONAL PROFILE AND  HISTORY: Detailed assessment: Review of records and additional review of physical, cognitive, psychosocial history related to current functional performance.  CLINICAL DECISION MAKING: Moderate - several treatment options, min-mod task modification necessary  REHAB POTENTIAL: Good  EVALUATION COMPLEXITY: Moderate    PLAN:  OT FREQUENCY: 2x/week  OT DURATION: 8 weeks  PLANNED INTERVENTIONS: self care/ADL training, therapeutic exercise, therapeutic activity, neuromuscular re-education, manual therapy, balance training, functional mobility training, ultrasound, biofeedback, compression bandaging, moist heat, cryotherapy, patient/family education, cognitive remediation/compensation, visual/perceptual remediation/compensation, psychosocial skills training, energy conservation, coping strategies training, and DME and/or AE instructions  RECOMMENDED OTHER SERVICES: N/A  CONSULTED AND AGREED  WITH PLAN OF CARE: Patient and family member/caregiver  PLAN FOR NEXT SESSION:  BUE ROM for dressing with focus on large amplitude and posture; Dynamic standing balance/endurance as well as energy conservation strategies.  Coordination activities.  Re-cert if warranted (most likely will be)   Nakiyah Beverley, [place], OTR/L 09/10/2022, 4:00 PM

## 2022-09-10 NOTE — Therapy (Signed)
OUTPATIENT PHYSICAL THERAPY NEURO TREATMENT   Patient Name: [name] MRN: [id number] DOB:1936/02/09, 87 y.o., male Today's Date: 09/10/2022   PCP: Donnajean Lopes, MD REFERRING PROVIDER: Star Age, MD     END OF SESSION:  PT End of Session - 09/10/22 1348     Visit Number 14    Number of Visits 16    Date for PT Re-Evaluation 09/10/22    Authorization Type Medicare    Progress Note Due on Visit 18    PT Start Time 1400    PT Stop Time 1445    PT Time Calculation (min) 45 min    Equipment Utilized During Treatment Gait belt    Activity Tolerance Patient tolerated treatment well    Behavior During Therapy WFL for tasks assessed/performed               Past Medical History:  Diagnosis Date   Ankylosing spondylitis ([place]) dx'd ~ 1974   Arthritis    "back" (08/10/2015)   BENIGN PROSTATIC HYPERTROPHY, WITH OBSTRUCTION 01/15/2010   Bleeding duodenal ulcer    Bleeding esophageal ulcer    Bleeding stomach ulcer    GERD 02/22/2008   History of blood transfusion 1988   "lost ~ 1/2 of my blood volume from multiple bleeding ulcers"   History of hiatal hernia    HYPERGLYCEMIA 11/18/2007   HYPERLIPIDEMIA 02/22/2008   HYPERTENSION, UNSPECIFIED 11/10/2009   Kidney stones    Osteoarthritis    c-spine   PEPTIC ULCER DISEASE 11/17/2007   Situational depression    "son died in [place] 06/24/15"   TIA (transient ischemic attack)    "not that I know of in the past; they are trying to determine if I've had one today (08/10/2015)"   Past Surgical History:  Procedure Laterality Date   CATARACT EXTRACTION W/ INTRAOCULAR LENS  IMPLANT, BILATERAL Bilateral 2013   Patient Active Problem List   Diagnosis Date Noted   Ankylosing spondylitis of cervicothoracic region ([place]) 10/16/2016   TIA (transient ischemic attack) 08/10/2015   Carotid stenosis 08/04/2014   Hyperlipidemia 02/21/2012   BENIGN PROSTATIC HYPERTROPHY, WITH OBSTRUCTION 01/15/2010   Essential hypertension 11/10/2009    GERD 02/22/2008   NEPHROLITHIASIS, HX OF 11/17/2007    ONSET DATE: Jun 23, 2022, PD for 4 years  REFERRING DIAG: Z91.81 (ICD-10-CM) - History of recent fall G20.C (ICD-10-CM) - Primary parkinsonism  THERAPY DIAG:  Muscle weakness (generalized)  Other symptoms and signs involving the nervous system  Unsteadiness on feet  Abnormal posture  Difficulty in walking, not elsewhere classified  Other abnormalities of gait and mobility  Rationale for Evaluation and Treatment: Rehabilitation  SUBJECTIVE:  SUBJECTIVE STATEMENT: Pt notes a backwards fall about a week ago resulting in skin tear left wrist.   Pt accompanied by: significant other  PERTINENT HISTORY: PD, complex medical history of peptic ulcer disease with history of upper GI bleed, TIA, depression, degenerative disc disease in the neck, osteoarthritis, kidney stones, hyperlipidemia, hypertension, history of hiatal hernia, BPH, arthritis, and ankylosing spondylitis, and parkinsonism  PAIN:  Are you having pain? No PRECAUTIONS: None  WEIGHT BEARING RESTRICTIONS: No  FALLS: Has patient fallen in last 6 months? Yes. Number of falls "several"  LIVING ENVIRONMENT: Lives with: lives with their spouse Lives in: House/apartment Stairs:  stairs inside home to loft, ramp to enter/exit home Has following equipment at home: Gilford Rile - 2 wheeled, since the fall in October  PLOF: [place]: return to PLOF  OBJECTIVE:   TODAY'S TREATMENT: 09/10/22 Activity Comments  NU-step level 4 x 5 min For coordination/fast alternating movement  LAQ 3x10 4# Cues for counting out loud  Sit to stand 3x5 Elevated EOM, arms outstretched (wide, large amp)  Sit to stand w/ march 3x5 Elevated EOM, 4# weights for SLS  Gait training -large  amplitude step length with visual target -retrowalk with CGA for support to afford large steps -fast walk with count down and count up timer, "make it from x to x in # seconds" -walking with cog dual tasking--procedural recall and abstract reasoning        TODAY'S TREATMENT: 09/05/22 Activity Comments  NU-step resistance levels x 9 min Heavy:slow, light:fast; 1:1 to improve response to varied   Stepping strategy -lateral over half roll -forwrad/backward over half roll  Standing on foam -EO/EC 2x30 sec -trunk twists 2x10 -alt taps BUE, single UE support, no UE support  Strength training -seated cable row 4x12 reps 25# -standing hip abd 2x12 reps  Backwards walk 2x25 ft CGA          PATIENT EDUCATION: Education details: discussion on progress towards goals and remaining impairments  Person educated: Patient Education method: Explanation, Demonstration, Tactile cues, and Verbal cues Education comprehension: verbalized understanding    HOME EXERCISE PROGRAM Last updated: 07/24/22 Access Code: [email or web address] URL: [email or web address] Date: 07/24/2022 Prepared by: [facility]  Program Notes perform with gait belt and wife's supervision  Exercises - Standing Balance in Corner  - 1 x daily - 7 x weekly - 3 sets - 30 sec hold - Narrow Stance with Counter Support  - 1 x daily - 5 x weekly - 2 sets - 10 reps - 30 sec hold - Standing Balance in Corner with Eyes Closed  - 1 x daily - 5 x weekly - 2 sets - 10 reps - 30 sec hold - Sit to Stand with Counter Support  - 1 x daily - 5 x weekly - 2 sets - 5-10 reps - Supine Lower Trunk Rotation  - 1 x daily - 7 x weekly - 3 sets - 10 reps - Supine Bridge  - 1 x daily - 7 x weekly - 3 sets - 10 reps - Thoracic Extension Mobilization on Foam Roll  - 1 x daily - 7 x weekly - 3 sets - 10 reps - 3 sec hold - Seated Shoulder Horizontal Abduction with Resistance  - 1 x daily - 7 x weekly - 3 sets - 10  reps   Below measures were taken at time of initial evaluation unless otherwise specified:   DIAGNOSTIC FINDINGS: no intracranial or osseous  injuries  COGNITION: Overall cognitive status: Within functional limits for tasks assessed   SENSATION: WFL  COORDINATION: Alternating movements impaired, right ankle limitations pre-morbid from hx of crush injury in childhood Heel to shin mildly impaired right > left Finger to nose WNL Finger opposition: unable to perform bilaterally  EDEMA:  none  MUSCLE TONE: NT  MUSCLE LENGTH: Presents with bilateral knee flexion contractures, approx 5-10 degrees  DTRs:    POSTURE: rounded shoulders, forward head, and increased thoracic kyphosis  LOWER EXTREMITY ROM:     Active  Right Eval Left Eval  Hip flexion    Hip extension    Hip abduction    Hip adduction    Hip internal rotation    Hip external rotation    Knee flexion    Knee extension -10 -10  Ankle dorsiflexion 5 10  Ankle plantarflexion    Ankle inversion    Ankle eversion     (Blank rows = not tested)  LOWER EXTREMITY MMT:  assessed in sitting  MMT Right Eval Left Eval  Hip flexion 4 4  Hip extension    Hip abduction 3+ 3+  Hip adduction 4- 4-  Hip internal rotation    Hip external rotation    Knee flexion 4 4  Knee extension 4- 4-  Ankle dorsiflexion 2+ 3+  Ankle plantarflexion    Ankle inversion    Ankle eversion    (Blank rows = not tested)  BED MOBILITY:  Mod A  TRANSFERS: Assistive device utilized: Environmental consultant - 2 wheeled  Sit to stand: CGA and Min A Stand to sit: SBA Chair to chair: CGA and Min A Floor:  DNT  RAMP:  NT  CURB:  Level of Assistance: CGA Assistive device utilized: Environmental consultant - 2 wheeled Curb Comments:   STAIRS: NT  GAIT: Gait pattern:  right knee extensor thrust in stance, shuffling, and trunk flexed, deficits during turning Distance walked:  Assistive device utilized: Environmental consultant - 2 wheeled Level of assistance: CGA and Min  A Comments: unsteady, deficits during turns  FUNCTIONAL TESTS:  5 times sit to stand: unable to complete, retro-pulsion Timed up and go (TUG): 29 sec Push and release test (# of steps): Anterior: absent  Posterior: absent  M-CTSIB  Condition 1: Firm Surface, EO 30 Sec, Mild Sway  Condition 2: Firm Surface, EC 30 Sec, Mild and Moderate Sway  Condition 3: Foam Surface, EO 0 Sec,  unable  Sway  Condition 4: Foam Surface, EC - Sec,  -  Sway     VESTIBULAR ASSESSMENT   GENERAL OBSERVATION: right eyebrow ptosis?    SYMPTOM BEHAVIOR:   Subjective history: fall and strike to anterior right forehead   Non-Vestibular symptoms: denies   Type of dizziness: Imbalance (Disequilibrium) and Lightheadedness/Faint   Frequency: "depends"   Duration: "whenever"   Aggravating factors: No known aggravating factors   Relieving factors: no known relieving factors   Progression of symptoms: unchanged   OCULOMOTOR EXAM:   Ocular Alignment: right eye ptosis   Ocular ROM: No Limitations   Spontaneous Nystagmus: absent   Gaze-Induced Nystagmus: age appropriate nystagmus at end range   Smooth Pursuits: saccades   Saccades: hypometric/undershoots, extra eye movements, and slow   Convergence/Divergence: 10 inches      VESTIBULAR - OCULAR REFLEX:    Slow VOR: Comment: difficulty in tracking   VOR Cancellation: Comment: NT   Head-Impulse Test: NT   Dynamic Visual Acuity: Not able to be assessed    POSITIONAL TESTING: unable  to test due to time limitation    MOTION SENSITIVITY:    Motion Sensitivity Quotient  Intensity: 0 = none, 1 = Lightheaded, 2 = Mild, 3 = Moderate, 4 = Severe, 5 = Vomiting  Intensity  1. Sitting to supine   2. Supine to L side   3. Supine to R side   4. Supine to sitting   5. L Hallpike-Dix   6. Up from L    7. R Hallpike-Dix   8. Up from R    9. Sitting, head  tipped to L knee   10. Head up from L  knee   11. Sitting, head  tipped to R knee   12. Head up from R   knee   13. Sitting head turns x5   14.Sitting head nods x5   15. In stance, 180  turn to L    16. In stance, 180  turn to R     OTHOSTATICS: not done  FUNCTIONAL GAIT:    VESTIBULAR TREATMENT:  Canalith Repositioning:   TBD Gaze Adaptation:   TBD Habituation:   TBD Other:  PATIENT SURVEYS:    PATIENT EDUCATION: Education details: assessment findings/interpretations Person educated: Patient and Spouse Education method: Explanation Education comprehension: verbalized understanding   GOALS: Goals reviewed with patient? Yes  SHORT TERM GOALS: Target date: 08/13/2022    Patient will perform HEP with family/caregiver supervision for improved strength, balance, transfers, gaze adaptation, habituation, balance, and gait  Baseline: Goal status: MET   2.  Demonstrate functional transfers with supervision level with or without AD to improve safety with functional mobility Baseline: CGA-min A; supervision/verbal cueing 08/23/22 Goal status: MET 08/23/22  3.  Manifest improved BLE strength as evidenced by ability to complete 5xSTS test in 25 sec to improve safety with mobility Baseline: unable; 20.51 sec 08/23/22 Goal status: MET 08/23/22   LONG TERM GOALS: Target date: 09/10/2022    Demonstrate reduce risk for falls per time 15 sec TUG test with least restrictive AD Baseline: 29 sec w/ RW; 31.11 sec with RW 08/23/22 Goal status: IN PROGRESS 08/23/22  2.  Demo ability to transfer and ambulate over various surfaces x 300 ft with modified independence to reduce level of assistance from caregiver Baseline: CGA-min A w/ RW; completed with CGA 08/23/22 Goal status: MET 08/23/22  3.  Demonstrate improved reactive balance as evidenced by ability to enact 2-3 steps with push-release test to facilitate righting reactions and reduce risk for falls Baseline: Absent, LOB; multiple steps required- min A in posterior direction, unable to the L Goal status: IN PROGRESS 08/23/22     ASSESSMENT:  CLINICAL IMPRESSION: Large amplitude activities for dynamic stability and coordination requiring consistent cues/feedback for sequence and technique 10% error correction. Proceeded with activities to enhance coordination and balance with emphasis on single lib support and performing dual task for added challenge and to promote coordination. Able to maintain walking albeit slower with procedual recall vs abstract which caused a complete cessation of movement. Improved gait speed carryover with walking task using counted number of steps or count timer  OBJECTIVE IMPAIRMENTS: Abnormal gait, decreased activity tolerance, decreased balance, decreased coordination, decreased knowledge of use of DME, difficulty walking, decreased ROM, decreased strength, dizziness, impaired flexibility, improper body mechanics, and postural dysfunction.   ACTIVITY LIMITATIONS: carrying, lifting, bending, standing, squatting, stairs, transfers, bed mobility, bathing, dressing, reach over head, and locomotion level  PARTICIPATION LIMITATIONS: meal prep, cleaning, laundry, shopping, community activity, and yard work  PERSONAL FACTORS: Age,  Time since onset of injury/illness/exacerbation, and 3+ comorbidities: see medical hx  are also affecting patient's functional outcome.   REHAB POTENTIAL: Good  CLINICAL DECISION MAKING: Evolving/moderate complexity  EVALUATION COMPLEXITY: Moderate  PLAN:  PT FREQUENCY: 2x/week  PT DURATION: 8 weeks  PLANNED INTERVENTIONS: Therapeutic exercises, Therapeutic activity, Neuromuscular re-education, Balance training, Gait training, Patient/Family education, Self Care, Joint mobilization, Stair training, Vestibular training, Canalith repositioning, DME instructions, Aquatic Therapy, Dry Needling, Electrical stimulation, Wheelchair mobility training, Spinal mobilization, Cryotherapy, Moist heat, Taping, and Manual therapy  PLAN FOR NEXT SESSION: Practice steps; work on  improving gait speed and balance recovery   1:49 PM, 09/10/22 M. Sherlyn Lees, PT, DPT Physical Therapist- [place] Office Number: [phone]   [place] [facility] [address] [place], [place] [address] Phone # [phone] Fax # [phone]

## 2022-09-12 ENCOUNTER — Ambulatory Visit: Payer: Medicare Other

## 2022-09-12 ENCOUNTER — Ambulatory Visit: Payer: Medicare Other | Admitting: Occupational Therapy

## 2022-09-12 DIAGNOSIS — R2681 Unsteadiness on feet: Secondary | ICD-10-CM | POA: Diagnosis not present

## 2022-09-12 DIAGNOSIS — R2689 Other abnormalities of gait and mobility: Secondary | ICD-10-CM

## 2022-09-12 DIAGNOSIS — R262 Difficulty in walking, not elsewhere classified: Secondary | ICD-10-CM

## 2022-09-12 DIAGNOSIS — R278 Other lack of coordination: Secondary | ICD-10-CM | POA: Diagnosis not present

## 2022-09-12 DIAGNOSIS — M6281 Muscle weakness (generalized): Secondary | ICD-10-CM | POA: Diagnosis not present

## 2022-09-12 DIAGNOSIS — R293 Abnormal posture: Secondary | ICD-10-CM

## 2022-09-12 DIAGNOSIS — R29818 Other symptoms and signs involving the nervous system: Secondary | ICD-10-CM | POA: Diagnosis not present

## 2022-09-12 NOTE — Therapy (Signed)
OUTPATIENT OCCUPATIONAL THERAPY Treatment Session  Patient Name: [name] MRN: [id number] DOB:Nov 22, 1935, 87 y.o., male Today's Date: 09/12/2022  PCP: Donnajean Lopes, MD REFERRING PROVIDER: Star Age, MD     END OF SESSION:  OT End of Session - 09/12/22 1327     Visit Number 15    Number of Visits 25    Date for OT Re-Evaluation 10/25/22    Authorization Type Medicare A &B    OT Start Time 2330    OT Stop Time 0762    OT Time Calculation (min) 40 min                         Past Medical History:  Diagnosis Date   Ankylosing spondylitis ([place]) dx'd ~ 1974   Arthritis    "back" (08/10/2015)   BENIGN PROSTATIC HYPERTROPHY, WITH OBSTRUCTION 01/15/2010   Bleeding duodenal ulcer    Bleeding esophageal ulcer    Bleeding stomach ulcer    GERD 02/22/2008   History of blood transfusion 1988   "lost ~ 1/2 of my blood volume from multiple bleeding ulcers"   History of hiatal hernia    HYPERGLYCEMIA 11/18/2007   HYPERLIPIDEMIA 02/22/2008   HYPERTENSION, UNSPECIFIED 11/10/2009   Kidney stones    Osteoarthritis    c-spine   PEPTIC ULCER DISEASE 11/17/2007   Situational depression    "son died in [place] 15-Jun-2015"   TIA (transient ischemic attack)    "not that I know of in the past; they are trying to determine if I've had one today (08/10/2015)"   Past Surgical History:  Procedure Laterality Date   CATARACT EXTRACTION W/ INTRAOCULAR LENS  IMPLANT, BILATERAL Bilateral 2013   Patient Active Problem List   Diagnosis Date Noted   Ankylosing spondylitis of cervicothoracic region ([place]) 10/16/2016   TIA (transient ischemic attack) 08/10/2015   Carotid stenosis 08/04/2014   Hyperlipidemia 02/21/2012   BENIGN PROSTATIC HYPERTROPHY, WITH OBSTRUCTION 01/15/2010   Essential hypertension 11/10/2009   GERD 02/22/2008   NEPHROLITHIASIS, HX OF 11/17/2007    ONSET DATE: referral date 07/04/22   REFERRING DIAG: G20.C (ICD-10-CM) - Parkinsonism, unspecified Z91.81  (ICD-10-CM) - History of falling  THERAPY DIAG:  Other symptoms and signs involving the nervous system  Other lack of coordination  Other abnormalities of gait and mobility  Muscle weakness (generalized)  Rationale for Evaluation and Treatment: Rehabilitation  SUBJECTIVE:   SUBJECTIVE STATEMENT: Pt reporting having difficulty with manipulation of small items.  Pt accompanied by: self and significant other  PERTINENT HISTORY: history of Parkinson's and ankylosing spondylitis   PRECAUTIONS: Fall  WEIGHT BEARING RESTRICTIONS: No  PAIN:  Are you having pain? No  FALLS: Has patient fallen in last 6 months? Yes. Number of falls 6 (3-4 since "big fall" in 2023-06-15)  LIVING ENVIRONMENT: Lives with: lives with their spouse Lives in: House/apartment Stairs: Yes: External: 2-3 steps; has had a ramp built just in last week Internal: steps to basement and steps to loft - however pt does not need to go up/down those at this time. Has following equipment at home: [employer] Rile - 2 wheeled, bed side commode, Ramped entry, and transport chair  PLOF: Independent  PATIENT GOALS: to be able to feel confident in any sort of movement to resume prior activities  OBJECTIVE:   HAND DOMINANCE: Right  ADLs: Overall ADLs: Prior to fall in 06-14-2022, pt Independent with all ADLs and IADLs without use of AE/DME, even completing shower in standing in  walk-in shower. Transfers/ambulation related to ADLs: fluctuates with mobility; now using RW for all mobility, Supervision with all mobility Eating: Mod I Grooming: wife will assist with opening items; otherwise setup and supervision due to balance UB Dressing: Min-mod assist, buttons/zippers are quite challenging LB Dressing: Mod assist, is able to don socks/shoes with increased time Toileting: Max assist Bathing: sponge baths since fall, max assist from wife at this time Tub Shower transfers: has walk-in shower, but does not feel safe in shower since  fall in Oct Equipment: bed side commode  IADLs: Meal Prep: since fall in October 2023, pt has been assisting with 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  Pt currently cutting foods for meal prep in sitting position. Medication management: wife assisting Handwriting:  TBA  MOBILITY STATUS: Needs Assist: CGA with mobility with RW and Hx of falls  POSTURE COMMENTS:  rounded shoulders and forward head  FUNCTIONAL OUTCOME MEASURES: Physical performance test: PPT #4 donning/doffing jacket: unable to complete due to LOB* 5 time sit > stand:  1:05.94 with up to mod assist and cues to scoot forward towards edge of chair and needing to push up from RW.  UPPER EXTREMITY ROM:  all movements are slowed, decreased shoulder flexion d/t ankylosing spondylitis  Active ROM Right eval Left eval  Shoulder flexion 90 90  Shoulder abduction    Shoulder adduction    Shoulder extension    Shoulder internal rotation [facility]  Shoulder external rotation [facility]  Elbow flexion [facility] WFL  Elbow extension [facility] WFL  Wrist flexion    Wrist extension    Wrist ulnar deviation    Wrist radial deviation    Wrist pronation    Wrist supination    (Blank rows = not tested)  UPPER EXTREMITY MMT:     MMT Right eval Left eval  Shoulder flexion 4/5 4/5  Shoulder abduction    Shoulder adduction    Shoulder extension    Shoulder internal rotation    Shoulder external rotation    Middle trapezius    Lower trapezius    Elbow flexion 4/5 4/5  Elbow extension 4/5 4/5  Wrist flexion    Wrist extension    Wrist ulnar deviation    Wrist radial deviation    Wrist pronation    Wrist supination    (Blank rows = not tested)  HAND FUNCTION: TBD  COORDINATION: 09/12/22 9 Hole Peg test: Right: 50.47 sec; Left: able to place 7 pegs in 2 mins time limit ** sec Box and Blocks:  Right 41blocks, Left 36 blocks hitting barrier x2 on RUE and x6 on LUE  COGNITION: Overall cognitive status:  Pt's spouse reports that his  thinking seems slower, but attributes it to stress from all issues presenting s/p fall in October  VISION: Subjective report: glasses broke with recent fall Baseline vision: Wears glasses all the time Visual history: cataracts  VISION ASSESSMENT: Not tested  Patient has difficulty with following activities due to following visual impairments: currently difficult to assess as pt does not have glasses with him, as they broke during fall in October 2023.  OBSERVATIONS: Pt with slowed response time and movements during transfers to/from treatment space and during assessment.   TODAY'S TREATMENT:    09/12/22 UB dressing: Pt able to fasten 50-75% of buttons, especially when wearing larger/looser button shirts.  Pt's wife will assist with top button and/or cuffs, especially as he fatigues.  Pt reports occasional swelling impacting motor control. LB dressing: Pt reports  difficulty with tying shoe laces.  Pt demonstrating difficulty with crossing LLE over opposite knee and difficulty with bending forward.  OT recommended use of step stool to decrease need to bend forward as far.  Pt demonstrating improved ability to reach towards foot when on 6" step.  OT educated on taking upright sitting rest break between tying shoe and completing double knot to allow for "re-set" of posture and allow for deeper reach.   Completed assessment of BUE fine and gross motor control with 9 hole peg test and Box and Blocks (see above coordination section).  Pt demonstration impairments bilaterally L >R.  Discussed functional impact of decreased fine and gross motor control and impact of PD on amplitude of movement (as noted with Box and blocks).     09/10/22 Medication management: discussed variety of pill box styles with focus on safe closure and pt ability to open.  Discussed push top vs flip top as well as AM/PM vs one spot pill boxes.  Pt trialed the styles in clinic and OT showing additional options while focusing on  ease but security of open/close feature. Buttons: pt continues to demonstrate difficulty with managing buttons.  Pt even reports pants buttons to be challenging which can be an issue with urgency with toileting needs.  Pt demonstrating improved sequencing and technique with managing buttons on own shirt vs shirt placed in front.  Pt demonstrating carryover of education of hand placement and technique when buttoning shirt.  OT demonstrating hand placement and simulating fastening/unfastening button on pants with focus on pull at button hole to open hole and then pushing button down through hole.  Pt benefited from additional cues and demonstration and will benefit from practice at home.   09/05/22 UE exercises: Engaged in UE exercises in sitting at mirror for visual feedback for more upright posture, as able.  Engaged in reaching around back with 2# medicine ball to pass from hand to hand.  Pt able to complete clockwise and counter clockwise with min cues for upright posture.  Attempted to reach around neck as above, however unable to complete, therefore transitioned to reaching up over head with focus on shoulder flexion and elbow extension. Shower transfers: Pt's spouse reports purchasing shower chair and placing it in shower.  They have completed 2 trial runs with transfers in/out of walk-in shower with pt able to complete with increased ability to step over shower ledge without shoes on.  Pt reports taking a shower yesterday and it went well.  They plan to purchase a grab bar for the shower to aid in transfer in/out. Coordination: w/ LUE/RUE and BUEs as appropriate, including: rotating small ball w/ fingertips, tossing/catching ball, picking up various small objects/coins, picking up coins and stacking them.  Pt with difficulty picking up coins from table, therefore educated pt and spouse on downgrading task to attempting with buttons or checker pieces for increased thickness.  Discussed carryover of ball  exercises to larger amplitude and postural control.   PATIENT EDUCATION: Education details: ongoing condition specific education.   Person educated: Patient and Spouse Education method: Explanation, Demonstration, and Verbal cues Education comprehension: verbalized understanding and needs further education  HOME EXERCISE PROGRAM: TBD   GOALS: Goals reviewed with patient? Yes  SHORT TERM GOALS: Target date: 08/16/22  Pt and wife will report understanding of adaptive techniques and/or potential DME/AE needs to increase ease, safety, and independence w/ ADLs. Baseline: Goal status: MET - spouse looking in to shower chair options to return to bathing  at shower level on 08/14/22  2.  Pt will perform dynamic standing task for 5 mins as needed for simple IADLs w/o LOB using DME and/or countertop support prn  Baseline: decreased standing tolerance Goal status: MET - 08/14/22  3.  Pt will demonstrate improved ease with sit > stand to get up from toilet or other seating options in home by improving score on sit > stand to decreased fall risk. Baseline: 1:05.94 Goal status: MET - 43.94 sec on 08/14/22  4.  Pt will complete UB dressing (except clothing fasteners) with supervision. Baseline: wife assists with buttons occasionally prior to fall Goal status: NOT MET - wife still needing to assist some days, but not other days  NEW Short Term Goals: 10/04/22 Pt will be independent in full body HEP with focus on large amplitude movements as needed to increase independence with ADLs. Baseline: Goal status: INITIAL   2.  Pt will complete UB dressing (except clothing fasteners) with supervision. Baseline: wife assists with buttons occasionally prior to fall Goal status: INITIAL   LONG TERM GOALS: Target date: 09/13/22  Pt will demonstrate shower transfers with DME PRN at supervision level. Baseline: currently sponge bathing due to fearfulness of showering, but wants to return to showers.    Goal status: NOT MET - HHA with transfers out of walk-in shower 09/10/22  2.  Pt will report increased participation in IADLs (such as cooking) to increase independence with functional IADLs.  Baseline:  Goal status: MET - requires increased time, but he is engaging in more 09/10/22  3.  Pt will complete UB dressing, to include clothing fasteners, at Mod I level with use of AE and/or alternative strategies PRN. Baseline:  Goal status: NOT MET - wife still assists with buttons almost daily  4.  Pt will complete LB dressing with Supervision/setup with use of AE and/or alternative strategies PRN. Baseline:  Goal status: NOT MET - still having difficulty with pants and tying shoes  5.  Pt will demonstrate improved dynamic standing balance for 15 mins as needed to engage in ADLs/IADLs with increased safety/endurance.   Baseline:  Goal status: NOT MET  6. Pt will demonstrate increased ease with dressing as evidenced by decreasing PPT#4(don/ doff jacket) by 10 secs or more.  Baseline: 57.25 sec on 08/23/22  Goal status: NOT MET  NEW Long Term Goals: 10/25/22 1. Pt will demonstrate/report shower transfers with DME PRN at supervision level. Baseline: Pt is now bathing at shower level with use of shower chair, still requiring HHA with transfers out of walk-in shower Goal status: INITIAL  2.  Pt will complete UB dressing, to include clothing fasteners, at Mod I level with use of AE and/or alternative strategies PRN. Baseline: requiring assist for shirt around shoulders occasionally and assist with buttons 25- 50% of time Goal status: INITIAL  3.  Pt will complete LB dressing Mod I with use of AE and/or alternative strategies PRN. Baseline:  still having difficulty with pants and tying shoes Goal status: INITIAL  4. Pt will demonstrate improved UE functional use for ADLs as evidenced by increasing box/ blocks score by 4 blocks with LUE. Baseline: Right 41blocks, Left 36 blocks hitting barrier  x2 on RUE and x6 on LUE Goal status: INITIAL  5.  Pt will demonstrate improved dynamic standing balance for 15 mins as needed to engage in ADLs/IADLs with increased safety/endurance.   Baseline:  Goal status: INITIAL  6. Pt will demonstrate increased ease with dressing as evidenced by decreasing  PPT#4(don/ doff jacket) by 10 secs or more.  Baseline: 57.25 sec on 08/23/22  Goal status: INITIAL  ASSESSMENT:  CLINICAL IMPRESSION: Pt continues to report and demonstrate difficulty with buttons, especially pants buttons, limited UE ROM as needed for UB dressing and LB dressing.  Pt also with difficulty with coordination impacting clothing fasteners and managing medications.  Pt will benefit from continued OT services to focus on FMC/GMC in addition to functional dressing skills.  Plan to educate on FMC/GMC coordination as well as education on AE to increase ease with functional tasks.  Pt initially with global concerns impacting mobility and safety in home, focus has been placed on these areas with noted improvements.  Plan to continue to address self-care tasks, bathroom transfers, recommended DME/AE, while also increase focus on FMC/GMC to increase independence and ease with more fine motor control tasks as well as large amplitude for dressing and reaching tasks.  PERFORMANCE DEFICITS: in functional skills including ADLs, IADLs, ROM, strength, pain, flexibility, Fine motor control, Gross motor control, mobility, balance, body mechanics, endurance, decreased knowledge of precautions, decreased knowledge of use of DME, and UE functional use, cognitive skills including attention, memory, safety awareness, sequencing, and thought, and psychosocial skills including environmental adaptation and routines and behaviors.   IMPAIRMENTS: are limiting patient from ADLs and IADLs.   CO-MORBIDITIES: may have co-morbidities  that affects occupational performance. Patient will benefit from skilled OT to address  above impairments and improve overall function.  MODIFICATION OR ASSISTANCE TO COMPLETE EVALUATION: Min-Moderate modification of tasks or assist with assess necessary to complete an evaluation.  OT OCCUPATIONAL PROFILE AND HISTORY: Detailed assessment: Review of records and additional review of physical, cognitive, psychosocial history related to current functional performance.  CLINICAL DECISION MAKING: Moderate - several treatment options, min-mod task modification necessary  REHAB POTENTIAL: Good  EVALUATION COMPLEXITY: Moderate    PLAN:  OT FREQUENCY: 2x/week  OT DURATION: 8 weeks  PLANNED INTERVENTIONS: self care/ADL training, therapeutic exercise, therapeutic activity, neuromuscular re-education, manual therapy, balance training, functional mobility training, ultrasound, biofeedback, compression bandaging, moist heat, cryotherapy, patient/family education, cognitive remediation/compensation, visual/perceptual remediation/compensation, psychosocial skills training, energy conservation, coping strategies training, and DME and/or AE instructions  RECOMMENDED OTHER SERVICES: N/A  CONSULTED AND AGREED WITH PLAN OF CARE: Patient and family member/caregiver  PLAN FOR NEXT SESSION:  BUE ROM for dressing with focus on large amplitude and posture; Modifications of positioning/alternative strategies for LB dressing, Dynamic standing balance/endurance as well as energy conservation strategies.  Initiate coordination activities and large amplitude movements.   Simonne Come, OTR/L 09/12/2022, 2:13 PM

## 2022-09-12 NOTE — Therapy (Signed)
OUTPATIENT PHYSICAL THERAPY NEURO TREATMENT, Progress Note and Recertification   Patient Name: [name] MRN: [id number] DOB:1936-05-17, 87 y.o., male Today's Date: 09/12/2022   PCP: Donnajean Lopes, MD REFERRING PROVIDER: Star Age, MD   Progress Note Reporting Period 08/23/22 to 09/12/22  See note below for Objective Data and Assessment of Progress/Goals.      END OF SESSION:  PT End of Session - 09/12/22 1353     Visit Number 15    Number of Visits 30    Date for PT Re-Evaluation 10/31/22    Authorization Type Medicare    Progress Note Due on Visit 25    PT Start Time 1400    PT Stop Time 1445    PT Time Calculation (min) 45 min    Equipment Utilized During Treatment Gait belt    Activity Tolerance Patient tolerated treatment well    Behavior During Therapy WFL for tasks assessed/performed               Past Medical History:  Diagnosis Date   Ankylosing spondylitis ([place]) dx'd ~ 1974   Arthritis    "back" (08/10/2015)   BENIGN PROSTATIC HYPERTROPHY, WITH OBSTRUCTION 01/15/2010   Bleeding duodenal ulcer    Bleeding esophageal ulcer    Bleeding stomach ulcer    GERD 02/22/2008   History of blood transfusion 1988   "lost ~ 1/2 of my blood volume from multiple bleeding ulcers"   History of hiatal hernia    HYPERGLYCEMIA 11/18/2007   HYPERLIPIDEMIA 02/22/2008   HYPERTENSION, UNSPECIFIED 11/10/2009   Kidney stones    Osteoarthritis    c-spine   PEPTIC ULCER DISEASE 11/17/2007   Situational depression    "son died in [place] 07-Jul-2015"   TIA (transient ischemic attack)    "not that I know of in the past; they are trying to determine if I've had one today (08/10/2015)"   Past Surgical History:  Procedure Laterality Date   CATARACT EXTRACTION W/ INTRAOCULAR LENS  IMPLANT, BILATERAL Bilateral 2013   Patient Active Problem List   Diagnosis Date Noted   Ankylosing spondylitis of cervicothoracic region ([place]) 10/16/2016   TIA (transient ischemic attack)  08/10/2015   Carotid stenosis 08/04/2014   Hyperlipidemia 02/21/2012   BENIGN PROSTATIC HYPERTROPHY, WITH OBSTRUCTION 01/15/2010   Essential hypertension 11/10/2009   GERD 02/22/2008   NEPHROLITHIASIS, HX OF 11/17/2007    ONSET DATE: July 06, 2022, PD for 4 years  REFERRING DIAG: Z91.81 (ICD-10-CM) - History of recent fall G20.C (ICD-10-CM) - Primary parkinsonism  THERAPY DIAG:  Other abnormalities of gait and mobility  Abnormal posture  Unsteadiness on feet  Muscle weakness (generalized)  Difficulty in walking, not elsewhere classified  Rationale for Evaluation and Treatment: Rehabilitation  SUBJECTIVE:  SUBJECTIVE STATEMENT: Feeling improved overall since beginning PT/OT and note improved mobility and independence has been gained since experiencing fall.  Pt accompanied by: significant other  PERTINENT HISTORY: PD, complex medical history of peptic ulcer disease with history of upper GI bleed, TIA, depression, degenerative disc disease in the neck, osteoarthritis, kidney stones, hyperlipidemia, hypertension, history of hiatal hernia, BPH, arthritis, and ankylosing spondylitis, and parkinsonism  PAIN:  Are you having pain? No PRECAUTIONS: None  WEIGHT BEARING RESTRICTIONS: No  FALLS: Has patient fallen in last 6 months? Yes. Number of falls "several"  LIVING ENVIRONMENT: Lives with: lives with their spouse Lives in: House/apartment Stairs:  stairs inside home to loft, ramp to enter/exit home Has following equipment at home: Gilford Rile - 2 wheeled, since the fall in October  PLOF: [place]: return to PLOF  OBJECTIVE:    TODAY'S TREATMENT: 09/12/22 Activity Comments  TUG test Trial 1: 27.82 w/ RW Trial 2: 24.81 sec w/ RW Trial 3: 20.12 w/out AD  Push-release  test 4-6 steps in posterior, 1-2 large amplitude anterior  5xSTS  26 sec  Berg Balance Test 43/56  M-CTSIB Condition 1: normal-mild Condition 2: mild-mod Condition 3: mod Condition 4: mod-severe x 5 sec  Knee extension ROM Full knee extension (some lack from knee OA but not significant)  /pt education -cervical spine posture      PATIENT EDUCATION: Education details: discussion on recertification and new STG/LTG Person educated: Patient Education method: Explanation, Demonstration, Tactile cues, and Verbal cues Education comprehension: verbalized understanding    HOME EXERCISE PROGRAM Last updated: 07/24/22 Access Code: [email or web address] URL: [email or web address] Date: 07/24/2022 Prepared by: [facility]  Program Notes perform with gait belt and wife's supervision  Exercises - Standing Balance in Corner  - 1 x daily - 7 x weekly - 3 sets - 30 sec hold - Narrow Stance with Counter Support  - 1 x daily - 5 x weekly - 2 sets - 10 reps - 30 sec hold - Standing Balance in Corner with Eyes Closed  - 1 x daily - 5 x weekly - 2 sets - 10 reps - 30 sec hold - Sit to Stand with Counter Support  - 1 x daily - 5 x weekly - 2 sets - 5-10 reps - Supine Lower Trunk Rotation  - 1 x daily - 7 x weekly - 3 sets - 10 reps - Supine Bridge  - 1 x daily - 7 x weekly - 3 sets - 10 reps - Thoracic Extension Mobilization on Foam Roll  - 1 x daily - 7 x weekly - 3 sets - 10 reps - 3 sec hold - Seated Shoulder Horizontal Abduction with Resistance  - 1 x daily - 7 x weekly - 3 sets - 10 reps   Below measures were taken at time of initial evaluation unless otherwise specified:   DIAGNOSTIC FINDINGS: no intracranial or osseous injuries  COGNITION: Overall cognitive status: Within functional limits for tasks assessed   SENSATION: WFL  COORDINATION: Alternating movements impaired, right ankle limitations pre-morbid from hx of crush injury in  childhood Heel to shin mildly impaired right > left Finger to nose WNL Finger opposition: unable to perform bilaterally    MUSCLE TONE: NT  MUSCLE LENGTH: Presents with bilateral knee flexion contractures, approx 5-10 degrees  DTRs:    POSTURE: rounded shoulders, forward head, and increased thoracic kyphosis  LOWER EXTREMITY ROM:     Active  Right Eval Left  Eval  Hip flexion    Hip extension    Hip abduction    Hip adduction    Hip internal rotation    Hip external rotation    Knee flexion    Knee extension 0 0  Ankle dorsiflexion 5 10  Ankle plantarflexion    Ankle inversion    Ankle eversion     (Blank rows = not tested)  LOWER EXTREMITY MMT:  assessed in sitting  MMT Right Eval Left Eval  Hip flexion 4 4  Hip extension    Hip abduction 3+ 3+  Hip adduction 4 4  Hip internal rotation    Hip external rotation    Knee flexion 4 4  Knee extension 4 4  Ankle dorsiflexion 3 3+  Ankle plantarflexion    Ankle inversion    Ankle eversion    (Blank rows = not tested)  BED MOBILITY:  Modified Indep  TRANSFERS: Modified indep to set-up level  Floor:  DNT  RAMP:  NT  CURB:  Level of Assistance: CGA-SBA Assistive device utilized: Environmental consultant - 2 wheeled Curb Comments:   STAIRS: SBA-CGA with HR  GAIT: Gait pattern:  right knee extensor thrust in stance, shuffling, and trunk flexed, deficits during turning Distance walked:  Assistive device utilized: Environmental consultant - 2 wheeled Level of assistance: Modified independence and SBA Comments: unsteady, deficits during turns  FUNCTIONAL TESTS:  5 times sit to stand: 25 sec Timed up and go (TUG): 20-27 sec Push and release test (# of steps): Anterior: 1-2; Posterior multiple small steps lead to LOB   Berg Balance Test: 43/56    GOALS: Goals reviewed with patient? Yes  SHORT TERM GOALS: Target date: 10/03/2022      Patient will perform advanced HEP with caregiver supervision to address balance, gait, postural  deviations, and functional strength deficits Baseline: met initial HEP Goal status: IN PROGRESS   2.  Demonstrate/report independence in household ambulation (no AD) to restore capabilities to PLOF Baseline: modified indep w/ RW household distances Goal status: IN PROGRESS  3.  Demo improved safety with ambulation and reduce risk for falls per TUG test 18 sec w/ or without AD Baseline: 20-27 sec Goal status: IN PROGRESS   LONG TERM GOALS: Target date: 10/31/2022      Demonstrate reduce risk for falls per time 15 sec TUG test with least restrictive AD Baseline: Goal status: IN PROGRESS  2.  Demo/report ability to ambulate uneven surfaces with walking stick in order to return to walking activity with spouse Baseline:  Goal status: IN PROGRESS  3.  Demonstrate improved balance and low risk for falls per [employer] Test score of 49/56 Baseline: 43/56 Goal status: IN PROGRESS     ASSESSMENT:  CLINICAL IMPRESSION: STG/LTG addressed today and demonstrates global improvements since start of care and pt and spouse endorse greater functional independence as he has improved bed mobility to a modified independent level and transfers to an independent or set-up assist level as compared to previous min-mod A needed from caregiver for assistance.  Pt able to meet STG and meet or progress with LTG. Recertification completed with outline of new STG/LTG to progress functional status and hopefully restore capabilities to PLOF where he was ambulatory at an independent level in the household and able to perform hiking distances with hiking stick and spouse supervision.  Berg Balance Test performed today and reveals high risk for falls per 43/56 score and able to perform TUG test with and without AD with times  of 20-27 sec with improved time not using AD.  Pt would benefit from continued PT sessions to address new suite of STG/LTG to improve functional status and reduce risk for falls  OBJECTIVE  IMPAIRMENTS: Abnormal gait, decreased activity tolerance, decreased balance, decreased coordination, decreased knowledge of use of DME, difficulty walking, decreased ROM, decreased strength, dizziness, impaired flexibility, improper body mechanics, and postural dysfunction.   ACTIVITY LIMITATIONS: carrying, lifting, bending, standing, squatting, stairs, transfers, bed mobility, bathing, dressing, reach over head, and locomotion level  PARTICIPATION LIMITATIONS: meal prep, cleaning, laundry, shopping, community activity, and yard work  PERSONAL FACTORS: Age, Time since onset of injury/illness/exacerbation, and 3+ comorbidities: see medical hx  are also affecting patient's functional outcome.   REHAB POTENTIAL: Good  CLINICAL DECISION MAKING: Evolving/moderate complexity  EVALUATION COMPLEXITY: Moderate  PLAN:  PT FREQUENCY: 2x/week  PT DURATION: other: 7 weeks  recommend additional 15 sessions  PLANNED INTERVENTIONS: Therapeutic exercises, Therapeutic activity, Neuromuscular re-education, Balance training, Gait training, Patient/Family education, Self Care, Joint mobilization, Stair training, Vestibular training, Canalith repositioning, DME instructions, Aquatic Therapy, Dry Needling, Electrical stimulation, Wheelchair mobility training, Spinal mobilization, Cryotherapy, Moist heat, Taping, and Manual therapy  PLAN FOR NEXT SESSION: HEP updates for postural re-education (e.g. supine chin retractions), reactive balance strategies  3:06 PM, 09/12/22 M. Sherlyn Lees, PT, DPT Physical Therapist- [place] Office Number: [phone]   [facility] [address], [place], [place] [address] Phone # [phone] Fax # [phone]

## 2022-09-17 ENCOUNTER — Ambulatory Visit: Payer: Medicare Other | Admitting: Occupational Therapy

## 2022-09-17 ENCOUNTER — Encounter: Payer: Self-pay | Admitting: Physical Therapy

## 2022-09-17 ENCOUNTER — Ambulatory Visit: Payer: Medicare Other | Admitting: Physical Therapy

## 2022-09-17 DIAGNOSIS — R278 Other lack of coordination: Secondary | ICD-10-CM | POA: Diagnosis not present

## 2022-09-17 DIAGNOSIS — M6281 Muscle weakness (generalized): Secondary | ICD-10-CM

## 2022-09-17 DIAGNOSIS — R293 Abnormal posture: Secondary | ICD-10-CM | POA: Diagnosis not present

## 2022-09-17 DIAGNOSIS — R29818 Other symptoms and signs involving the nervous system: Secondary | ICD-10-CM

## 2022-09-17 DIAGNOSIS — R2689 Other abnormalities of gait and mobility: Secondary | ICD-10-CM

## 2022-09-17 DIAGNOSIS — R2681 Unsteadiness on feet: Secondary | ICD-10-CM | POA: Diagnosis not present

## 2022-09-17 NOTE — Therapy (Signed)
OUTPATIENT OCCUPATIONAL THERAPY Treatment Session  Patient Name: [name] MRN: [id number] DOB:10/12/1935, 87 y.o., male Today's Date: 09/17/2022  PCP: Donnajean Lopes, MD REFERRING PROVIDER: Star Age, MD     END OF SESSION:  OT End of Session - 09/17/22 1104     Visit Number 16    Number of Visits 25    Date for OT Re-Evaluation 10/25/22    Authorization Type Medicare A &B    OT Start Time 1104    OT Stop Time 9357    OT Time Calculation (min) 45 min                          Past Medical History:  Diagnosis Date   Ankylosing spondylitis ([place]) dx'd ~ 1974   Arthritis    "back" (08/10/2015)   BENIGN PROSTATIC HYPERTROPHY, WITH OBSTRUCTION 01/15/2010   Bleeding duodenal ulcer    Bleeding esophageal ulcer    Bleeding stomach ulcer    GERD 02/22/2008   History of blood transfusion 1988   "lost ~ 1/2 of my blood volume from multiple bleeding ulcers"   History of hiatal hernia    HYPERGLYCEMIA 11/18/2007   HYPERLIPIDEMIA 02/22/2008   HYPERTENSION, UNSPECIFIED 11/10/2009   Kidney stones    Osteoarthritis    c-spine   PEPTIC ULCER DISEASE 11/17/2007   Situational depression    "son died in [place] 2015-07-01"   TIA (transient ischemic attack)    "not that I know of in the past; they are trying to determine if I've had one today (08/10/2015)"   Past Surgical History:  Procedure Laterality Date   CATARACT EXTRACTION W/ INTRAOCULAR LENS  IMPLANT, BILATERAL Bilateral 2013   Patient Active Problem List   Diagnosis Date Noted   Ankylosing spondylitis of cervicothoracic region ([place]) 10/16/2016   TIA (transient ischemic attack) 08/10/2015   Carotid stenosis 08/04/2014   Hyperlipidemia 02/21/2012   BENIGN PROSTATIC HYPERTROPHY, WITH OBSTRUCTION 01/15/2010   Essential hypertension 11/10/2009   GERD 02/22/2008   NEPHROLITHIASIS, HX OF 11/17/2007    ONSET DATE: referral date 07/04/22   REFERRING DIAG: G20.C (ICD-10-CM) - Parkinsonism, unspecified Z91.81  (ICD-10-CM) - History of falling  THERAPY DIAG:  Other symptoms and signs involving the nervous system  Other lack of coordination  Other abnormalities of gait and mobility  Muscle weakness (generalized)  Abnormal posture  Rationale for Evaluation and Treatment: Rehabilitation  SUBJECTIVE:   SUBJECTIVE STATEMENT: Pt reports that they did not have heat over the weekend.  Pt accompanied by: self and significant other  PERTINENT HISTORY: history of Parkinson's and ankylosing spondylitis   PRECAUTIONS: Fall  WEIGHT BEARING RESTRICTIONS: No  PAIN:  Are you having pain? Yes: NPRS scale: 2/10 Pain location: back Pain description: spasms Aggravating factors: with movement Relieving factors: rest, repositioning  FALLS: Has patient fallen in last 6 months? Yes. Number of falls 6 (3-4 since "big fall" in 01-Jul-2023)  LIVING ENVIRONMENT: Lives with: lives with their spouse Lives in: House/apartment Stairs: Yes: External: 2-3 steps; has had a ramp built just in last week Internal: steps to basement and steps to loft - however pt does not need to go up/down those at this time. Has following equipment at home: Walker - 2 wheeled, bed side commode, Ramped entry, and transport chair  PLOF: Independent  PATIENT GOALS: to be able to feel confident in any sort of movement to resume prior activities  OBJECTIVE:   HAND DOMINANCE: Right  ADLs: Overall ADLs:  Prior to fall in October 2023, pt Independent with all ADLs and IADLs without use of AE/DME, even completing shower in standing in walk-in shower. Transfers/ambulation related to ADLs: fluctuates with mobility; now using RW for all mobility, Supervision with all mobility Eating: Mod I Grooming: wife will assist with opening items; otherwise setup and supervision due to balance UB Dressing: Min-mod assist, buttons/zippers are quite challenging LB Dressing: Mod assist, is able to don socks/shoes with increased time Toileting: Max  assist Bathing: sponge baths since fall, max assist from wife at this time Tub Shower transfers: has walk-in shower, but does not feel safe in shower since fall in Oct Equipment: bed side commode  IADLs: Meal Prep: since fall in October 2023, pt has been assisting with 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  Pt currently cutting foods for meal prep in sitting position. Medication management: wife assisting Handwriting:  TBA  MOBILITY STATUS: Needs Assist: CGA with mobility with RW and Hx of falls  POSTURE COMMENTS:  rounded shoulders and forward head  FUNCTIONAL OUTCOME MEASURES: Physical performance test: PPT #4 donning/doffing jacket: unable to complete due to LOB* 5 time sit > stand:  1:05.94 with up to mod assist and cues to scoot forward towards edge of chair and needing to push up from RW.  UPPER EXTREMITY ROM:  all movements are slowed, decreased shoulder flexion d/t ankylosing spondylitis  Active ROM Right eval Left eval  Shoulder flexion 90 90  Shoulder abduction    Shoulder adduction    Shoulder extension    Shoulder internal rotation [facility]  Shoulder external rotation [facility]  Elbow flexion [facility] WFL  Elbow extension [facility] WFL  Wrist flexion    Wrist extension    Wrist ulnar deviation    Wrist radial deviation    Wrist pronation    Wrist supination    (Blank rows = not tested)  UPPER EXTREMITY MMT:     MMT Right eval Left eval  Shoulder flexion 4/5 4/5  Shoulder abduction    Shoulder adduction    Shoulder extension    Shoulder internal rotation    Shoulder external rotation    Middle trapezius    Lower trapezius    Elbow flexion 4/5 4/5  Elbow extension 4/5 4/5  Wrist flexion    Wrist extension    Wrist ulnar deviation    Wrist radial deviation    Wrist pronation    Wrist supination    (Blank rows = not tested)  HAND FUNCTION: TBD  COORDINATION: 09/12/22 9 Hole Peg test: Right: 50.47 sec; Left: able to place 7 pegs in 2 mins time limit ** sec Box  and Blocks:  Right 41blocks, Left 36 blocks hitting barrier x2 on RUE and x6 on LUE  COGNITION: Overall cognitive status:  Pt's spouse reports that his thinking seems slower, but attributes it to stress from all issues presenting s/p fall in October  VISION: Subjective report: glasses broke with recent fall Baseline vision: Wears glasses all the time Visual history: cataracts  VISION ASSESSMENT: Not tested  Patient has difficulty with following activities due to following visual impairments: currently difficult to assess as pt does not have glasses with him, as they broke during fall in October 2023.  OBSERVATIONS: Pt with slowed response time and movements during transfers to/from treatment space and during assessment.   TODAY'S TREATMENT:    09/17/22 Coordination: w/ LUE/RUE/each UE as appropriate, including: flipping cards one at a time off a deck, picking up  a small object and translating palm-to-fingertips, picking up coins and stacking them, and picking up 5-10 coins 1 at a time and translating palm to fingertips to place in coin slot.  OT providing demonstration and mod verbal cues for large amplitude movement and supination during card flipping activity.  OT placed towel on table top to allow for increased friction and ability to pick coins up from table top.  Initially attempted translation with stones however pt with increased difficulty therefore switched to coins for more purposeful task and pt demonstrating improved understanding and technique with coins.      09/12/22 UB dressing: Pt able to fasten 50-75% of buttons, especially when wearing larger/looser button shirts.  Pt's wife will assist with top button and/or cuffs, especially as he fatigues.  Pt reports occasional swelling impacting motor control. LB dressing: Pt reports difficulty with tying shoe laces.  Pt demonstrating difficulty with crossing LLE over opposite knee and difficulty with bending forward.  OT recommended  use of step stool to decrease need to bend forward as far.  Pt demonstrating improved ability to reach towards foot when on 6" step.  OT educated on taking upright sitting rest break between tying shoe and completing double knot to allow for "re-set" of posture and allow for deeper reach.   Completed assessment of BUE fine and gross motor control with 9 hole peg test and Box and Blocks (see above coordination section).  Pt demonstration impairments bilaterally L >R.  Discussed functional impact of decreased fine and gross motor control and impact of PD on amplitude of movement (as noted with Box and blocks).     09/10/22 Medication management: discussed variety of pill box styles with focus on safe closure and pt ability to open.  Discussed push top vs flip top as well as AM/PM vs one spot pill boxes.  Pt trialed the styles in clinic and OT showing additional options while focusing on ease but security of open/close feature. Buttons: pt continues to demonstrate difficulty with managing buttons.  Pt even reports pants buttons to be challenging which can be an issue with urgency with toileting needs.  Pt demonstrating improved sequencing and technique with managing buttons on own shirt vs shirt placed in front.  Pt demonstrating carryover of education of hand placement and technique when buttoning shirt.  OT demonstrating hand placement and simulating fastening/unfastening button on pants with focus on pull at button hole to open hole and then pushing button down through hole.  Pt benefited from additional cues and demonstration and will benefit from practice at home.   PATIENT EDUCATION: Education details: ongoing condition specific education.   Person educated: Patient and Spouse Education method: Explanation, Demonstration, Verbal cues, and Handouts Education comprehension: verbalized understanding and needs further education  HOME EXERCISE PROGRAM: Coordination HEP (see pt  instructions)   GOALS: Goals reviewed with patient? Yes  SHORT TERM GOALS: Target date 10/04/22  Pt will be independent in full body HEP with focus on large amplitude movements as needed to increase independence with ADLs. Baseline: Goal status: IN PROGRESS   2.  Pt will complete UB dressing (except clothing fasteners) with supervision. Baseline: wife assists with buttons occasionally prior to fall Goal status: IN PROGRESS   LONG TERM GOALS: Target date: 10/25/22  1. Pt will demonstrate/report shower transfers with DME PRN at supervision level. Baseline: Pt is now bathing at shower level with use of shower chair, still requiring HHA with transfers out of walk-in shower Goal status: IN PROGRESS  2.  Pt will complete UB dressing, to include clothing fasteners, at Mod I level with use of AE and/or alternative strategies PRN. Baseline: requiring assist for shirt around shoulders occasionally and assist with buttons 25- 50% of time Goal status: IN PROGRESS  3.  Pt will complete LB dressing Mod I with use of AE and/or alternative strategies PRN. Baseline:  still having difficulty with pants and tying shoes Goal status: IN PROGRESS  4. Pt will demonstrate improved UE functional use for ADLs as evidenced by increasing box/ blocks score by 4 blocks with LUE. Baseline: Right 41blocks, Left 36 blocks hitting barrier x2 on RUE and x6 on LUE Goal status: IN PROGRESS  5.  Pt will demonstrate improved dynamic standing balance for 15 mins as needed to engage in ADLs/IADLs with increased safety/endurance.   Baseline:  Goal status: IN PROGRESS  6. Pt will demonstrate increased ease with dressing as evidenced by decreasing PPT#4(don/ doff jacket) by 10 secs or more.  Baseline: 57.25 sec on 08/23/22  Goal status: IN PROGRESS  ASSESSMENT:  CLINICAL IMPRESSION: Pt demonstrating mod-max difficulty initially when picking up coins from table top, therefore OT placed towel on table top to allow for  increased friction/resistance to allow pt to pick up coins without sliding off edge of table.  OT educated pt and wife on use of towel on table top as needed to allow for focus on technique of task vs precise challenge.  Pt benefiting from multimodal cues for technique with translation as well as large amplitude movements and supination during card flipping activity.    PERFORMANCE DEFICITS: in functional skills including ADLs, IADLs, ROM, strength, pain, flexibility, Fine motor control, Gross motor control, mobility, balance, body mechanics, endurance, decreased knowledge of precautions, decreased knowledge of use of DME, and UE functional use, cognitive skills including attention, memory, safety awareness, sequencing, and thought, and psychosocial skills including environmental adaptation and routines and behaviors.   IMPAIRMENTS: are limiting patient from ADLs and IADLs.   CO-MORBIDITIES: may have co-morbidities  that affects occupational performance. Patient will benefit from skilled OT to address above impairments and improve overall function.  MODIFICATION OR ASSISTANCE TO COMPLETE EVALUATION: Min-Moderate modification of tasks or assist with assess necessary to complete an evaluation.  OT OCCUPATIONAL PROFILE AND HISTORY: Detailed assessment: Review of records and additional review of physical, cognitive, psychosocial history related to current functional performance.  CLINICAL DECISION MAKING: Moderate - several treatment options, min-mod task modification necessary  REHAB POTENTIAL: Good  EVALUATION COMPLEXITY: Moderate    PLAN:  OT FREQUENCY: 2x/week  OT DURATION: 8 weeks  PLANNED INTERVENTIONS: self care/ADL training, therapeutic exercise, therapeutic activity, neuromuscular re-education, manual therapy, balance training, functional mobility training, ultrasound, biofeedback, compression bandaging, moist heat, cryotherapy, patient/family education, cognitive  remediation/compensation, visual/perceptual remediation/compensation, psychosocial skills training, energy conservation, coping strategies training, and DME and/or AE instructions  RECOMMENDED OTHER SERVICES: N/A  CONSULTED AND AGREED WITH PLAN OF CARE: Patient and family member/caregiver  PLAN FOR NEXT SESSION:  BUE ROM for dressing with focus on large amplitude and posture; Modifications of positioning/alternative strategies for LB dressing, Dynamic standing balance/endurance as well as energy conservation strategies.  Initiate coordination activities and large amplitude movements.   Simonne Come, OTR/L 09/17/2022, 12:02 PM

## 2022-09-17 NOTE — Patient Instructions (Signed)
Coordination Exercises  Perform the following exercises for 15 minutes 1 times per day. Perform with both hand(s). Perform using big movements.  Flipping Cards: Place deck of cards on the table. Flip cards over by opening your hand big to grasp and then turn your palm up big. Deal cards: Hold 1/2 or whole deck in your hand. Use thumb to push card off top of deck with one big push.  Pick up coins and place in coin bank or container: Pick up with big, intentional movements. Do not drag coin to the edge. Pick up coins and stack one at a time: Pick up with big, intentional movements. Do not drag coin to the edge. (5-10 in a stack) Pick up 5-10 coins one at a time and hold in palm. Then, move coins from palm to fingertips one at time and place in coin bank/container. Pick up 5-10 coins one at a time and hold in palm. Then, move coins from palm to fingertips one at a time to stack.  Pick up various small objects that are different shapes/sizes and place in container. (paperclips, buttons, keys, dried beans/pasta, coins, screws, nuts/bolts, washers, board game pieces, etc.) Fasten nuts/bolts or put on bottle caps: Turn as much/as big as you can with each turn.

## 2022-09-17 NOTE — Therapy (Signed)
OUTPATIENT PHYSICAL THERAPY NEURO TREATMENT NOTE   Patient Name: [name] MRN: [id number] DOB:1936-07-18, 87 y.o., male Today's Date: 09/17/2022   PCP: Donnajean Lopes, MD REFERRING PROVIDER: Star Age, MD       END OF SESSION:  PT End of Session - 09/17/22 1145     Visit Number 16    Number of Visits 30    Date for PT Re-Evaluation 10/31/22    Authorization Type Medicare    Progress Note Due on Visit 25    PT Start Time 1152    PT Stop Time 1234    PT Time Calculation (min) 42 min    Equipment Utilized During Treatment Gait belt    Activity Tolerance Patient tolerated treatment well    Behavior During Therapy WFL for tasks assessed/performed               Past Medical History:  Diagnosis Date   Ankylosing spondylitis ([place]) dx'd ~ 1974   Arthritis    "back" (08/10/2015)   BENIGN PROSTATIC HYPERTROPHY, WITH OBSTRUCTION 01/15/2010   Bleeding duodenal ulcer    Bleeding esophageal ulcer    Bleeding stomach ulcer    GERD 02/22/2008   History of blood transfusion 1988   "lost ~ 1/2 of my blood volume from multiple bleeding ulcers"   History of hiatal hernia    HYPERGLYCEMIA 11/18/2007   HYPERLIPIDEMIA 02/22/2008   HYPERTENSION, UNSPECIFIED 11/10/2009   Kidney stones    Osteoarthritis    c-spine   PEPTIC ULCER DISEASE 11/17/2007   Situational depression    "son died in [place] 2015-06-21"   TIA (transient ischemic attack)    "not that I know of in the past; they are trying to determine if I've had one today (08/10/2015)"   Past Surgical History:  Procedure Laterality Date   CATARACT EXTRACTION W/ INTRAOCULAR LENS  IMPLANT, BILATERAL Bilateral 2013   Patient Active Problem List   Diagnosis Date Noted   Ankylosing spondylitis of cervicothoracic region ([place]) 10/16/2016   TIA (transient ischemic attack) 08/10/2015   Carotid stenosis 08/04/2014   Hyperlipidemia 02/21/2012   BENIGN PROSTATIC HYPERTROPHY, WITH OBSTRUCTION 01/15/2010   Essential hypertension  11/10/2009   GERD 02/22/2008   NEPHROLITHIASIS, HX OF 11/17/2007    ONSET DATE: 06-20-22, PD for 4 years  REFERRING DIAG: Z91.81 (ICD-10-CM) - History of recent fall G20.C (ICD-10-CM) - Primary parkinsonism  THERAPY DIAG:  Other abnormalities of gait and mobility  Muscle weakness (generalized)  Unsteadiness on feet  Rationale for Evaluation and Treatment: Rehabilitation  SUBJECTIVE:  SUBJECTIVE STATEMENT: Feel like I take 2 steps forward and 1 step back.  Just a slow process getting back to where I want to be.  Bothers me when I have a balance disturbance.  Per wife:  Had a fall in the middle of the night last Thursday, due to getting caught in the backwards LOB.   Pt accompanied by: significant other  PERTINENT HISTORY: PD, complex medical history of peptic ulcer disease with history of upper GI bleed, TIA, depression, degenerative disc disease in the neck, osteoarthritis, kidney stones, hyperlipidemia, hypertension, history of hiatal hernia, BPH, arthritis, and ankylosing spondylitis, and parkinsonism  PAIN:  Are you having pain? No  PRECAUTIONS: None  WEIGHT BEARING RESTRICTIONS: No  FALLS: Has patient fallen in last 6 months? Yes. Number of falls "several"  LIVING ENVIRONMENT: Lives with: lives with their spouse Lives in: House/apartment Stairs:  stairs inside home to loft, ramp to enter/exit home Has following equipment at home: Gilford Rile - 2 wheeled, since the fall in October  PLOF: [place]: return to PLOF  OBJECTIVE:    TODAY'S TREATMENT: 09/17/2022 Activity Comments  Sit to stand 2 x 5 reps, from mat surface Occasional cues for increased forward lean and cues for wider BOS; several episodes of retropulsion  Stagger stance forward/back rocking, at counter,  x 10 Cues for technique and for weightshift/"hinge" at hips  Posterior step and weigthshift x 10 reps each side on solid surface, then standing on A[place]x Increased posterior lean and near LOB while on Airex  Short distance gait no device 20 ft x 2 reps, with cues for increased step length Min guard/min assist  In parallel bars:  forward/back walking, x 3 reps In posterior direction, cues for wider BOS and foot clearance  Standing on solid surface, then on incline, forward/back weightshift through ankles/hips Facilitation to increase hip excursion and cues to widen BOS  Standing on incline, forward/back step and weightshift Facilitation to increase hip excursion      PATIENT EDUCATION: Education details: widened BOS for standing, for sit to stand; stagger stance/wide BOS position to allow more hip motion to help offset posterior LOB. Person educated: Patient and Spouse Education method: Explanation, Demonstration, Tactile cues, and Verbal cues Education comprehension: verbalized understanding, returned demonstration, verbal cues required, and needs further education    HOME EXERCISE PROGRAM Last updated: 07/24/22 Access Code: [email or web address] URL: [email or web address] Date: 07/24/2022 Prepared by: [facility]  Program Notes perform with gait belt and wife's supervision  Exercises - Standing Balance in Corner  - 1 x daily - 7 x weekly - 3 sets - 30 sec hold - Narrow Stance with Counter Support  - 1 x daily - 5 x weekly - 2 sets - 10 reps - 30 sec hold - Standing Balance in Corner with Eyes Closed  - 1 x daily - 5 x weekly - 2 sets - 10 reps - 30 sec hold - Sit to Stand with Counter Support  - 1 x daily - 5 x weekly - 2 sets - 5-10 reps - Supine Lower Trunk Rotation  - 1 x daily - 7 x weekly - 3 sets - 10 reps - Supine Bridge  - 1 x daily - 7 x weekly - 3 sets - 10 reps - Thoracic Extension Mobilization on Foam Roll  - 1 x daily - 7 x weekly -  3 sets - 10 reps - 3 sec hold - Seated Shoulder Horizontal Abduction with  Resistance  - 1 x daily - 7 x weekly - 3 sets - 10 reps   Below measures were taken at time of initial evaluation unless otherwise specified:   DIAGNOSTIC FINDINGS: no intracranial or osseous injuries  COGNITION: Overall cognitive status: Within functional limits for tasks assessed   SENSATION: WFL  COORDINATION: Alternating movements impaired, right ankle limitations pre-morbid from hx of crush injury in childhood Heel to shin mildly impaired right > left Finger to nose WNL Finger opposition: unable to perform bilaterally    MUSCLE TONE: NT  MUSCLE LENGTH: Presents with bilateral knee flexion contractures, approx 5-10 degrees  DTRs:    POSTURE: rounded shoulders, forward head, and increased thoracic kyphosis  LOWER EXTREMITY ROM:     Active  Right Eval Left Eval  Hip flexion    Hip extension    Hip abduction    Hip adduction    Hip internal rotation    Hip external rotation    Knee flexion    Knee extension 0 0  Ankle dorsiflexion 5 10  Ankle plantarflexion    Ankle inversion    Ankle eversion     (Blank rows = not tested)  LOWER EXTREMITY MMT:  assessed in sitting  MMT Right Eval Left Eval  Hip flexion 4 4  Hip extension    Hip abduction 3+ 3+  Hip adduction 4 4  Hip internal rotation    Hip external rotation    Knee flexion 4 4  Knee extension 4 4  Ankle dorsiflexion 3 3+  Ankle plantarflexion    Ankle inversion    Ankle eversion    (Blank rows = not tested)  BED MOBILITY:  Modified Indep  TRANSFERS: Modified indep to set-up level  Floor:  DNT  RAMP:  NT  CURB:  Level of Assistance: CGA-SBA Assistive device utilized: Environmental consultant - 2 wheeled Curb Comments:   STAIRS: SBA-CGA with HR  GAIT: Gait pattern:  right knee extensor thrust in stance, shuffling, and trunk flexed, deficits during turning Distance walked:  Assistive device utilized: Environmental consultant - 2  wheeled Level of assistance: Modified independence and SBA Comments: unsteady, deficits during turns  FUNCTIONAL TESTS:  5 times sit to stand: 25 sec Timed up and go (TUG): 20-27 sec Push and release test (# of steps): Anterior: 1-2; Posterior multiple small steps lead to LOB   Berg Balance Test: 43/56    GOALS: Goals reviewed with patient? Yes  SHORT TERM GOALS: Target date: 10/03/2022      Patient will perform advanced HEP with caregiver supervision to address balance, gait, postural deviations, and functional strength deficits Baseline: met initial HEP Goal status: IN PROGRESS   2.  Demonstrate/report independence in household ambulation (no AD) to restore capabilities to PLOF Baseline: modified indep w/ RW household distances Goal status: IN PROGRESS  3.  Demo improved safety with ambulation and reduce risk for falls per TUG test 18 sec w/ or without AD Baseline: 20-27 sec Goal status: IN PROGRESS   LONG TERM GOALS: Target date: 10/31/2022      Demonstrate reduce risk for falls per time 15 sec TUG test with least restrictive AD Baseline: Goal status: IN PROGRESS  2.  Demo/report ability to ambulate uneven surfaces with walking stick in order to return to walking activity with spouse Baseline:  Goal status: IN PROGRESS  3.  Demonstrate improved balance and low risk for falls per [employer] Balance Test score of 49/56 Baseline: 43/56 Goal status: IN PROGRESS  ASSESSMENT:  CLINICAL IMPRESSION: Skilled PT session today working on balance strategies including weightshifting to incorporate more ankle/hip strategy to lessen posterior tendency for LOB.  Pt has tendency for narrowed BOS and to have strong posterior push through upper body, and needs verbal, demo, tactile cues for use of "hinge" at hip motion and for wider BOS to lessen these posterior tendencies.  He will continue to benefit from further skilled PT towards goals for improved overall functional mobility and  decreased fall risk.   OBJECTIVE IMPAIRMENTS: Abnormal gait, decreased activity tolerance, decreased balance, decreased coordination, decreased knowledge of use of DME, difficulty walking, decreased ROM, decreased strength, dizziness, impaired flexibility, improper body mechanics, and postural dysfunction.   ACTIVITY LIMITATIONS: carrying, lifting, bending, standing, squatting, stairs, transfers, bed mobility, bathing, dressing, reach over head, and locomotion level  PARTICIPATION LIMITATIONS: meal prep, cleaning, laundry, shopping, community activity, and yard work  PERSONAL FACTORS: Age, Time since onset of injury/illness/exacerbation, and 3+ comorbidities: see medical hx  are also affecting patient's functional outcome.   REHAB POTENTIAL: Good  CLINICAL DECISION MAKING: Evolving/moderate complexity  EVALUATION COMPLEXITY: Moderate  PLAN:  PT FREQUENCY: 2x/week  PT DURATION: other: 7 weeks  recommend additional 15 sessions  PLANNED INTERVENTIONS: Therapeutic exercises, Therapeutic activity, Neuromuscular re-education, Balance training, Gait training, Patient/Family education, Self Care, Joint mobilization, Stair training, Vestibular training, Canalith repositioning, DME instructions, Aquatic Therapy, Dry Needling, Electrical stimulation, Wheelchair mobility training, Spinal mobilization, Cryotherapy, Moist heat, Taping, and Manual therapy  PLAN FOR NEXT SESSION: Continue balance strategies, but work on wider BOS, staggered stance positions and use of hip strategy, step strategies for balance.  HEP updates for postural re-education (e.g. supine chin retractions)  Mady Haagensen, PT 09/17/22 12:40 PM Phone: [phone] Fax: [phone]  [facility] at [facility] [place], [address] [place],  [place] [address] Phone # [phone] Fax # [phone]

## 2022-09-18 NOTE — Therapy (Signed)
OUTPATIENT PHYSICAL THERAPY NEURO TREATMENT NOTE   Patient Name: [name] MRN: [id number] DOB:06-21-1936, 87 y.o., male Today's Date: 09/19/2022   PCP: Donnajean Lopes, MD REFERRING PROVIDER: Star Age, MD       END OF SESSION:  PT End of Session - 09/19/22 1615     Visit Number 17    Number of Visits 30    Date for PT Re-Evaluation 10/31/22    Authorization Type Medicare    Progress Note Due on Visit 25    PT Start Time 1533    PT Stop Time 1615    PT Time Calculation (min) 42 min    Equipment Utilized During Treatment Gait belt    Activity Tolerance Patient tolerated treatment well    Behavior During Therapy WFL for tasks assessed/performed                Past Medical History:  Diagnosis Date   Ankylosing spondylitis ([place]) dx'd ~ 1974   Arthritis    "back" (08/10/2015)   BENIGN PROSTATIC HYPERTROPHY, WITH OBSTRUCTION 01/15/2010   Bleeding duodenal ulcer    Bleeding esophageal ulcer    Bleeding stomach ulcer    GERD 02/22/2008   History of blood transfusion 1988   "lost ~ 1/2 of my blood volume from multiple bleeding ulcers"   History of hiatal hernia    HYPERGLYCEMIA 11/18/2007   HYPERLIPIDEMIA 02/22/2008   HYPERTENSION, UNSPECIFIED 11/10/2009   Kidney stones    Osteoarthritis    c-spine   PEPTIC ULCER DISEASE 11/17/2007   Situational depression    "son died in [place] 06-10-15"   TIA (transient ischemic attack)    "not that I know of in the past; they are trying to determine if I've had one today (08/10/2015)"   Past Surgical History:  Procedure Laterality Date   CATARACT EXTRACTION W/ INTRAOCULAR LENS  IMPLANT, BILATERAL Bilateral 2013   Patient Active Problem List   Diagnosis Date Noted   Ankylosing spondylitis of cervicothoracic region ([place]) 10/16/2016   TIA (transient ischemic attack) 08/10/2015   Carotid stenosis 08/04/2014   Hyperlipidemia 02/21/2012   BENIGN PROSTATIC HYPERTROPHY, WITH OBSTRUCTION 01/15/2010   Essential hypertension  11/10/2009   GERD 02/22/2008   NEPHROLITHIASIS, HX OF 11/17/2007    ONSET DATE: 09-Jun-2022, PD for 4 years  REFERRING DIAG: Z91.81 (ICD-10-CM) - History of recent fall G20.C (ICD-10-CM) - Primary parkinsonism  THERAPY DIAG:  Other abnormalities of gait and mobility  Muscle weakness (generalized)  Unsteadiness on feet  Rationale for Evaluation and Treatment: Rehabilitation  SUBJECTIVE:  SUBJECTIVE STATEMENT: No falls in the last week or so. Had a fall last Thursday coming back to bed from the bathroom and fell backwards, scraping the L forearm.   Pt accompanied by: significant other  PERTINENT HISTORY: PD, complex medical history of peptic ulcer disease with history of upper GI bleed, TIA, depression, degenerative disc disease in the neck, osteoarthritis, kidney stones, hyperlipidemia, hypertension, history of hiatal hernia, BPH, arthritis, and ankylosing spondylitis, and parkinsonism  PAIN:  Are you having pain? No  PRECAUTIONS: None  WEIGHT BEARING RESTRICTIONS: No  FALLS: Has patient fallen in last 6 months? Yes. Number of falls "several"  LIVING ENVIRONMENT: Lives with: lives with their spouse Lives in: House/apartment Stairs:  stairs inside home to loft, ramp to enter/exit home Has following equipment at home: Gilford Rile - 2 wheeled, since the fall in October  PLOF: [place]: return to PLOF  OBJECTIVE:     TODAY'S TREATMENT: 09/19/22 Activity Comments  STS from 21 in mat, then with anterior trunk lean before each stand 10x Min A occasionally; cues to scoot forward; encouraged ant trunk lean  Sitting anterior trunk lean to target then coming up tall in sitting x8 Good ability   staggered fwd/back wt shifts  Heavy cueing for increased amplitude; looking straight  ahead   wide stance fwd/back wt shifts Difficulty sequencing with verbal and manual cues  Lateral wt shift Improved amplitude   Backwards walking along counter top x8 1 UE on counter; cueing to stand tall; tried to complete in 8-10 steps at slower pace; improved with practice   PATIENT EDUCATION: Education details: spoke with pt and spouse on creative ways to perform HEP at home despite limited space Person educated: Patient and Spouse Education method: Explanation, Demonstration, Tactile cues, and Verbal cues Education comprehension: verbalized understanding and returned demonstration    HOME EXERCISE PROGRAM Last updated: 07/24/22 Access Code: [email or web address] URL: [email or web address] Date: 07/24/2022 Prepared by: [facility]  Program Notes perform with gait belt and wife's supervision  Exercises - Standing Balance in Corner  - 1 x daily - 7 x weekly - 3 sets - 30 sec hold - Narrow Stance with Counter Support  - 1 x daily - 5 x weekly - 2 sets - 10 reps - 30 sec hold - Standing Balance in Corner with Eyes Closed  - 1 x daily - 5 x weekly - 2 sets - 10 reps - 30 sec hold - Sit to Stand with Counter Support  - 1 x daily - 5 x weekly - 2 sets - 5-10 reps - Supine Lower Trunk Rotation  - 1 x daily - 7 x weekly - 3 sets - 10 reps - Supine Bridge  - 1 x daily - 7 x weekly - 3 sets - 10 reps - Thoracic Extension Mobilization on Foam Roll  - 1 x daily - 7 x weekly - 3 sets - 10 reps - 3 sec hold - Seated Shoulder Horizontal Abduction with Resistance  - 1 x daily - 7 x weekly - 3 sets - 10 reps   Below measures were taken at time of initial evaluation unless otherwise specified:   DIAGNOSTIC FINDINGS: no intracranial or osseous injuries  COGNITION: Overall cognitive status: Within functional limits for tasks assessed   SENSATION: WFL  COORDINATION: Alternating movements impaired, right ankle limitations pre-morbid from hx of crush  injury in childhood Heel to shin mildly impaired right > left Finger to nose WNL  Finger opposition: unable to perform bilaterally    MUSCLE TONE: NT  MUSCLE LENGTH: Presents with bilateral knee flexion contractures, approx 5-10 degrees  DTRs:    POSTURE: rounded shoulders, forward head, and increased thoracic kyphosis  LOWER EXTREMITY ROM:     Active  Right Eval Left Eval  Hip flexion    Hip extension    Hip abduction    Hip adduction    Hip internal rotation    Hip external rotation    Knee flexion    Knee extension 0 0  Ankle dorsiflexion 5 10  Ankle plantarflexion    Ankle inversion    Ankle eversion     (Blank rows = not tested)  LOWER EXTREMITY MMT:  assessed in sitting  MMT Right Eval Left Eval  Hip flexion 4 4  Hip extension    Hip abduction 3+ 3+  Hip adduction 4 4  Hip internal rotation    Hip external rotation    Knee flexion 4 4  Knee extension 4 4  Ankle dorsiflexion 3 3+  Ankle plantarflexion    Ankle inversion    Ankle eversion    (Blank rows = not tested)  BED MOBILITY:  Modified Indep  TRANSFERS: Modified indep to set-up level  Floor:  DNT  RAMP:  NT  CURB:  Level of Assistance: CGA-SBA Assistive device utilized: Environmental consultant - 2 wheeled Curb Comments:   STAIRS: SBA-CGA with HR  GAIT: Gait pattern:  right knee extensor thrust in stance, shuffling, and trunk flexed, deficits during turning Distance walked:  Assistive device utilized: Environmental consultant - 2 wheeled Level of assistance: Modified independence and SBA Comments: unsteady, deficits during turns  FUNCTIONAL TESTS:  5 times sit to stand: 25 sec Timed up and go (TUG): 20-27 sec Push and release test (# of steps): Anterior: 1-2; Posterior multiple small steps lead to LOB   Berg Balance Test: 43/56    GOALS: Goals reviewed with patient? Yes  SHORT TERM GOALS: Target date: 10/03/2022      Patient will perform advanced HEP with caregiver supervision to address balance,  gait, postural deviations, and functional strength deficits Baseline: met initial HEP Goal status: IN PROGRESS   2.  Demonstrate/report independence in household ambulation (no AD) to restore capabilities to PLOF Baseline: modified indep w/ RW household distances Goal status: IN PROGRESS  3.  Demo improved safety with ambulation and reduce risk for falls per TUG test 18 sec w/ or without AD Baseline: 20-27 sec Goal status: IN PROGRESS   LONG TERM GOALS: Target date: 10/31/2022      Demonstrate reduce risk for falls per time 15 sec TUG test with least restrictive AD Baseline: Goal status: IN PROGRESS  2.  Demo/report ability to ambulate uneven surfaces with walking stick in order to return to walking activity with spouse Baseline:  Goal status: IN PROGRESS  3.  Demonstrate improved balance and low risk for falls per [employer] Test score of 49/56 Baseline: 43/56 Goal status: IN PROGRESS     ASSESSMENT:  CLINICAL IMPRESSION: Patient arrived to session with report of a fall backwards last Thursday when coming back to bed from the bathroom, hurting the R forearm. Heavy instruction provided to encourage increased anterior trunk lean with STS transfers today. Patient demonstrated improved form and carryover after practice. Required practice and encouragement to perform larger amplitude weight shifts with balance activities. Able to elicit larger steps with use of a target number of steps with walking trials. Patient reported understanding of  all edu provided and without complaints at end of session.    OBJECTIVE IMPAIRMENTS: Abnormal gait, decreased activity tolerance, decreased balance, decreased coordination, decreased knowledge of use of DME, difficulty walking, decreased ROM, decreased strength, dizziness, impaired flexibility, improper body mechanics, and postural dysfunction.   ACTIVITY LIMITATIONS: carrying, lifting, bending, standing, squatting, stairs, transfers, bed  mobility, bathing, dressing, reach over head, and locomotion level  PARTICIPATION LIMITATIONS: meal prep, cleaning, laundry, shopping, community activity, and yard work  PERSONAL FACTORS: Age, Time since onset of injury/illness/exacerbation, and 3+ comorbidities: see medical hx  are also affecting patient's functional outcome.   REHAB POTENTIAL: Good  CLINICAL DECISION MAKING: Evolving/moderate complexity  EVALUATION COMPLEXITY: Moderate  PLAN:  PT FREQUENCY: 2x/week  PT DURATION: other: 7 weeks  recommend additional 15 sessions  PLANNED INTERVENTIONS: Therapeutic exercises, Therapeutic activity, Neuromuscular re-education, Balance training, Gait training, Patient/Family education, Self Care, Joint mobilization, Stair training, Vestibular training, Canalith repositioning, DME instructions, Aquatic Therapy, Dry Needling, Electrical stimulation, Wheelchair mobility training, Spinal mobilization, Cryotherapy, Moist heat, Taping, and Manual therapy  PLAN FOR NEXT SESSION: Continue balance strategies, but work on wider BOS, staggered stance positions and use of hip strategy, step strategies for balance.  HEP updates for postural re-education (e.g. supine chin retractions)   Janene Harvey, PT, DPT 09/19/22 4:19 PM  [facility] at [facility] [place], [place] [place], [place] [address] Phone # [phone] Fax # [phone]

## 2022-09-19 ENCOUNTER — Encounter: Payer: Self-pay | Admitting: Physical Therapy

## 2022-09-19 ENCOUNTER — Ambulatory Visit: Payer: Medicare Other | Admitting: Occupational Therapy

## 2022-09-19 ENCOUNTER — Ambulatory Visit: Payer: Medicare Other | Admitting: Physical Therapy

## 2022-09-19 DIAGNOSIS — R2681 Unsteadiness on feet: Secondary | ICD-10-CM

## 2022-09-19 DIAGNOSIS — M6281 Muscle weakness (generalized): Secondary | ICD-10-CM | POA: Diagnosis not present

## 2022-09-19 DIAGNOSIS — R2689 Other abnormalities of gait and mobility: Secondary | ICD-10-CM

## 2022-09-19 DIAGNOSIS — R293 Abnormal posture: Secondary | ICD-10-CM

## 2022-09-19 DIAGNOSIS — R29818 Other symptoms and signs involving the nervous system: Secondary | ICD-10-CM | POA: Diagnosis not present

## 2022-09-19 DIAGNOSIS — R278 Other lack of coordination: Secondary | ICD-10-CM | POA: Diagnosis not present

## 2022-09-19 NOTE — Therapy (Signed)
OUTPATIENT OCCUPATIONAL THERAPY Treatment Session  Patient Name: [name] MRN: [id number] DOB:1935/10/31, 87 y.o., male Today's Date: 09/19/2022  PCP: Donnajean Lopes, MD REFERRING PROVIDER: Star Age, MD     END OF SESSION:  OT End of Session - 09/19/22 1409     Visit Number 17    Number of Visits 25    Date for OT Re-Evaluation 10/25/22    Authorization Type Medicare A &B    OT Start Time 8466    OT Stop Time 5993    OT Time Calculation (min) 40 min                           Past Medical History:  Diagnosis Date   Ankylosing spondylitis ([place]) dx'd ~ 1974   Arthritis    "back" (08/10/2015)   BENIGN PROSTATIC HYPERTROPHY, WITH OBSTRUCTION 01/15/2010   Bleeding duodenal ulcer    Bleeding esophageal ulcer    Bleeding stomach ulcer    GERD 02/22/2008   History of blood transfusion 1988   "lost ~ 1/2 of my blood volume from multiple bleeding ulcers"   History of hiatal hernia    HYPERGLYCEMIA 11/18/2007   HYPERLIPIDEMIA 02/22/2008   HYPERTENSION, UNSPECIFIED 11/10/2009   Kidney stones    Osteoarthritis    c-spine   PEPTIC ULCER DISEASE 11/17/2007   Situational depression    "son died in [place] June 20, 2015"   TIA (transient ischemic attack)    "not that I know of in the past; they are trying to determine if I've had one today (08/10/2015)"   Past Surgical History:  Procedure Laterality Date   CATARACT EXTRACTION W/ INTRAOCULAR LENS  IMPLANT, BILATERAL Bilateral 2013   Patient Active Problem List   Diagnosis Date Noted   Ankylosing spondylitis of cervicothoracic region ([place]) 10/16/2016   TIA (transient ischemic attack) 08/10/2015   Carotid stenosis 08/04/2014   Hyperlipidemia 02/21/2012   BENIGN PROSTATIC HYPERTROPHY, WITH OBSTRUCTION 01/15/2010   Essential hypertension 11/10/2009   GERD 02/22/2008   NEPHROLITHIASIS, HX OF 11/17/2007    ONSET DATE: referral date 07/04/22   REFERRING DIAG: G20.C (ICD-10-CM) - Parkinsonism, unspecified Z91.81  (ICD-10-CM) - History of falling  THERAPY DIAG:  Other abnormalities of gait and mobility  Muscle weakness (generalized)  Unsteadiness on feet  Other lack of coordination  Abnormal posture  Rationale for Evaluation and Treatment: Rehabilitation  SUBJECTIVE:   SUBJECTIVE STATEMENT: Pt's spouse reports that he was able to dress himself completely today in a reasonable amount of time.  Pt accompanied by: self and significant other  PERTINENT HISTORY: history of Parkinson's and ankylosing spondylitis   PRECAUTIONS: Fall  WEIGHT BEARING RESTRICTIONS: No  PAIN:  Are you having pain? No  FALLS: Has patient fallen in last 6 months? Yes. Number of falls 6 (3-4 since "big fall" in 06/20/23)  LIVING ENVIRONMENT: Lives with: lives with their spouse Lives in: House/apartment Stairs: Yes: External: 2-3 steps; has had a ramp built just in last week Internal: steps to basement and steps to loft - however pt does not need to go up/down those at this time. Has following equipment at home: Gilford Rile - 2 wheeled, bed side commode, Ramped entry, and transport chair  PLOF: Independent  PATIENT GOALS: to be able to feel confident in any sort of movement to resume prior activities  OBJECTIVE:   HAND DOMINANCE: Right  ADLs: Overall ADLs: Prior to fall in Jun 19, 2022, pt Independent with all ADLs and IADLs without  use of AE/DME, even completing shower in standing in walk-in shower. Transfers/ambulation related to ADLs: fluctuates with mobility; now using RW for all mobility, Supervision with all mobility Eating: Mod I Grooming: wife will assist with opening items; otherwise setup and supervision due to balance UB Dressing: Min-mod assist, buttons/zippers are quite challenging LB Dressing: Mod assist, is able to don socks/shoes with increased time Toileting: Max assist Bathing: sponge baths since fall, max assist from wife at this time Tub Shower transfers: has walk-in shower, but does not feel  safe in shower since fall in Oct Equipment: bed side commode  IADLs: Meal Prep: since fall in October 2023, pt has been assisting with 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  Pt currently cutting foods for meal prep in sitting position. Medication management: wife assisting Handwriting:  TBA  MOBILITY STATUS: Needs Assist: CGA with mobility with RW and Hx of falls  POSTURE COMMENTS:  rounded shoulders and forward head  FUNCTIONAL OUTCOME MEASURES: Physical performance test: PPT #4 donning/doffing jacket: unable to complete due to LOB* 5 time sit > stand:  1:05.94 with up to mod assist and cues to scoot forward towards edge of chair and needing to push up from RW.  UPPER EXTREMITY ROM:  all movements are slowed, decreased shoulder flexion d/t ankylosing spondylitis  Active ROM Right eval Left eval  Shoulder flexion 90 90  Shoulder abduction    Shoulder adduction    Shoulder extension    Shoulder internal rotation [facility]  Shoulder external rotation [facility]  Elbow flexion [facility] WFL  Elbow extension [facility] WFL  Wrist flexion    Wrist extension    Wrist ulnar deviation    Wrist radial deviation    Wrist pronation    Wrist supination    (Blank rows = not tested)  UPPER EXTREMITY MMT:     MMT Right eval Left eval  Shoulder flexion 4/5 4/5  Shoulder abduction    Shoulder adduction    Shoulder extension    Shoulder internal rotation    Shoulder external rotation    Middle trapezius    Lower trapezius    Elbow flexion 4/5 4/5  Elbow extension 4/5 4/5  Wrist flexion    Wrist extension    Wrist ulnar deviation    Wrist radial deviation    Wrist pronation    Wrist supination    (Blank rows = not tested)  HAND FUNCTION: TBD  COORDINATION: 09/12/22 9 Hole Peg test: Right: 50.47 sec; Left: able to place 7 pegs in 2 mins time limit ** sec Box and Blocks:  Right 41blocks, Left 36 blocks hitting barrier x2 on RUE and x6 on LUE  COGNITION: Overall cognitive status:  Pt's  spouse reports that his thinking seems slower, but attributes it to stress from all issues presenting s/p fall in October  VISION: Subjective report: glasses broke with recent fall Baseline vision: Wears glasses all the time Visual history: cataracts  VISION ASSESSMENT: Not tested  Patient has difficulty with following activities due to following visual impairments: currently difficult to assess as pt does not have glasses with him, as they broke during fall in October 2023.  OBSERVATIONS: Pt with slowed response time and movements during transfers to/from treatment space and during assessment.   TODAY'S TREATMENT:    09/19/22 Large amplitude UE exercises: engaged in forearm supination/pronation and wrist flexion/extension.  Pt demonstrating limited ROM with supination, especially on LUE.  Discussed impact of ankylosing spondylitis and importance of maintaining ROM  as able. Resistance Clothespins 2,4,6# with RUE and LUE for high functional reaching and sustained pinch. Pt required mod cues for movement pattern and maintaining good upright posture when reaching.  Pt demonstrating increased spontaneous upright posture with more functional task. PNF pattern reaching with visual cue for increased upright posture. OT providing target for pt to reach up towards with bilateral diagonals.    09/17/22 Coordination: w/ LUE/RUE/each UE as appropriate, including: flipping cards one at a time off a deck, picking up a small object and translating palm-to-fingertips, picking up coins and stacking them, and picking up 5-10 coins 1 at a time and translating palm to fingertips to place in coin slot.  OT providing demonstration and mod verbal cues for large amplitude movement and supination during card flipping activity.  OT placed towel on table top to allow for increased friction and ability to pick coins up from table top.  Initially attempted translation with stones however pt with increased difficulty  therefore switched to coins for more purposeful task and pt demonstrating improved understanding and technique with coins.      09/12/22 UB dressing: Pt able to fasten 50-75% of buttons, especially when wearing larger/looser button shirts.  Pt's wife will assist with top button and/or cuffs, especially as he fatigues.  Pt reports occasional swelling impacting motor control. LB dressing: Pt reports difficulty with tying shoe laces.  Pt demonstrating difficulty with crossing LLE over opposite knee and difficulty with bending forward.  OT recommended use of step stool to decrease need to bend forward as far.  Pt demonstrating improved ability to reach towards foot when on 6" step.  OT educated on taking upright sitting rest break between tying shoe and completing double knot to allow for "re-set" of posture and allow for deeper reach.   Completed assessment of BUE fine and gross motor control with 9 hole peg test and Box and Blocks (see above coordination section).  Pt demonstration impairments bilaterally L >R.  Discussed functional impact of decreased fine and gross motor control and impact of PD on amplitude of movement (as noted with Box and blocks).     09/10/22 Medication management: discussed variety of pill box styles with focus on safe closure and pt ability to open.  Discussed push top vs flip top as well as AM/PM vs one spot pill boxes.  Pt trialed the styles in clinic and OT showing additional options while focusing on ease but security of open/close feature. Buttons: pt continues to demonstrate difficulty with managing buttons.  Pt even reports pants buttons to be challenging which can be an issue with urgency with toileting needs.  Pt demonstrating improved sequencing and technique with managing buttons on own shirt vs shirt placed in front.  Pt demonstrating carryover of education of hand placement and technique when buttoning shirt.  OT demonstrating hand placement and simulating  fastening/unfastening button on pants with focus on pull at button hole to open hole and then pushing button down through hole.  Pt benefited from additional cues and demonstration and will benefit from practice at home.   PATIENT EDUCATION: Education details: ongoing condition specific education.   Person educated: Patient and Spouse Education method: Explanation, Demonstration, Verbal cues, and Handouts Education comprehension: verbalized understanding and needs further education  HOME EXERCISE PROGRAM: Coordination HEP (see pt instructions)  Access Code: [email or web address] URL: [email or web address] Date: 09/19/2022 Prepared by: [facility]  Exercises - Shoulder PNF D2 Extension  - 1 x daily -  3 x weekly - 2 sets - 10 reps - Standing Shoulder Internal Rotation AROM Behind Back  - 1 x daily - 3 x weekly - 2 sets - 10 reps - Open Book Chest Stretch on Towel Roll  - 1 x daily - 3 x weekly - 2 sets - 10 reps   GOALS: Goals reviewed with patient? Yes  SHORT TERM GOALS: Target date 10/04/22  Pt will be independent in full body HEP with focus on large amplitude movements as needed to increase independence with ADLs. Baseline: Goal status: IN PROGRESS   2.  Pt will complete UB dressing (except clothing fasteners) with supervision. Baseline: wife assists with buttons occasionally prior to fall Goal status: IN PROGRESS   LONG TERM GOALS: Target date: 10/25/22  1. Pt will demonstrate/report shower transfers with DME PRN at supervision level. Baseline: Pt is now bathing at shower level with use of shower chair, still requiring HHA with transfers out of walk-in shower Goal status: IN PROGRESS  2.  Pt will complete UB dressing, to include clothing fasteners, at Mod I level with use of AE and/or alternative strategies PRN. Baseline: requiring assist for shirt around shoulders occasionally and assist with buttons 25- 50% of time Goal status: IN  PROGRESS  3.  Pt will complete LB dressing Mod I with use of AE and/or alternative strategies PRN. Baseline:  still having difficulty with pants and tying shoes Goal status: IN PROGRESS  4. Pt will demonstrate improved UE functional use for ADLs as evidenced by increasing box/ blocks score by 4 blocks with LUE. Baseline: Right 41blocks, Left 36 blocks hitting barrier x2 on RUE and x6 on LUE Goal status: IN PROGRESS  5.  Pt will demonstrate improved dynamic standing balance for 15 mins as needed to engage in ADLs/IADLs with increased safety/endurance.   Baseline:  Goal status: IN PROGRESS  6. Pt will demonstrate increased ease with dressing as evidenced by decreasing PPT#4(don/ doff jacket) by 10 secs or more.  Baseline: 57.25 sec on 08/23/22  Goal status: IN PROGRESS  ASSESSMENT:  CLINICAL IMPRESSION: Pt benefiting from more functional reaching with clothespins over PNF pattern reaching with ball.  Pt demonstrating improved upright posture when provided with target to reach for or functional task.  OT educated pt and wife on engaging in functional tasks of putting away dishes or hanging clothes in closet for increased functional reach and upright posture.  Pt benefiting from multimodal cues for technique.  PERFORMANCE DEFICITS: in functional skills including ADLs, IADLs, ROM, strength, pain, flexibility, Fine motor control, Gross motor control, mobility, balance, body mechanics, endurance, decreased knowledge of precautions, decreased knowledge of use of DME, and UE functional use, cognitive skills including attention, memory, safety awareness, sequencing, and thought, and psychosocial skills including environmental adaptation and routines and behaviors.   IMPAIRMENTS: are limiting patient from ADLs and IADLs.   CO-MORBIDITIES: may have co-morbidities  that affects occupational performance. Patient will benefit from skilled OT to address above impairments and improve overall  function.  MODIFICATION OR ASSISTANCE TO COMPLETE EVALUATION: Min-Moderate modification of tasks or assist with assess necessary to complete an evaluation.  OT OCCUPATIONAL PROFILE AND HISTORY: Detailed assessment: Review of records and additional review of physical, cognitive, psychosocial history related to current functional performance.  CLINICAL DECISION MAKING: Moderate - several treatment options, min-mod task modification necessary  REHAB POTENTIAL: Good  EVALUATION COMPLEXITY: Moderate    PLAN:  OT FREQUENCY: 2x/week  OT DURATION: 8 weeks  PLANNED INTERVENTIONS: self care/ADL training,  therapeutic exercise, therapeutic activity, neuromuscular re-education, manual therapy, balance training, functional mobility training, ultrasound, biofeedback, compression bandaging, moist heat, cryotherapy, patient/family education, cognitive remediation/compensation, visual/perceptual remediation/compensation, psychosocial skills training, energy conservation, coping strategies training, and DME and/or AE instructions  RECOMMENDED OTHER SERVICES: N/A  CONSULTED AND AGREED WITH PLAN OF CARE: Patient and family member/caregiver  PLAN FOR NEXT SESSION:  BUE ROM for dressing with focus on large amplitude and posture; Modifications of positioning/alternative strategies for LB dressing, Dynamic standing balance/endurance as well as energy conservation strategies.  Initiate coordination activities and large amplitude movements.   Khamiya Varin, [place], OTR/L 09/19/2022, 2:10 PM

## 2022-09-20 NOTE — Therapy (Signed)
OUTPATIENT PHYSICAL THERAPY NEURO TREATMENT NOTE   Patient Name: [name] MRN: [id number] DOB:10-30-1935, 87 y.o., male Today's Date: 09/23/2022   PCP: Donnajean Lopes, MD REFERRING PROVIDER: Star Age, MD       END OF SESSION:  PT End of Session - 09/23/22 1236     Visit Number 18    Number of Visits 30    Date for PT Re-Evaluation 10/31/22    Authorization Type Medicare    Progress Note Due on Visit 25    PT Start Time 1149    PT Stop Time 1232    PT Time Calculation (min) 43 min    Equipment Utilized During Treatment Gait belt    Activity Tolerance Patient tolerated treatment well    Behavior During Therapy WFL for tasks assessed/performed                 Past Medical History:  Diagnosis Date   Ankylosing spondylitis ([place]) dx'd ~ 1974   Arthritis    "back" (08/10/2015)   BENIGN PROSTATIC HYPERTROPHY, WITH OBSTRUCTION 01/15/2010   Bleeding duodenal ulcer    Bleeding esophageal ulcer    Bleeding stomach ulcer    GERD 02/22/2008   History of blood transfusion 1988   "lost ~ 1/2 of my blood volume from multiple bleeding ulcers"   History of hiatal hernia    HYPERGLYCEMIA 11/18/2007   HYPERLIPIDEMIA 02/22/2008   HYPERTENSION, UNSPECIFIED 11/10/2009   Kidney stones    Osteoarthritis    c-spine   PEPTIC ULCER DISEASE 11/17/2007   Situational depression    "son died in [place] 2015/07/03"   TIA (transient ischemic attack)    "not that I know of in the past; they are trying to determine if I've had one today (08/10/2015)"   Past Surgical History:  Procedure Laterality Date   CATARACT EXTRACTION W/ INTRAOCULAR LENS  IMPLANT, BILATERAL Bilateral 2013   Patient Active Problem List   Diagnosis Date Noted   Ankylosing spondylitis of cervicothoracic region ([place]) 10/16/2016   TIA (transient ischemic attack) 08/10/2015   Carotid stenosis 08/04/2014   Hyperlipidemia 02/21/2012   BENIGN PROSTATIC HYPERTROPHY, WITH OBSTRUCTION 01/15/2010   Essential  hypertension 11/10/2009   GERD 02/22/2008   NEPHROLITHIASIS, HX OF 11/17/2007    ONSET DATE: 2022/07/02, PD for 4 years  REFERRING DIAG: Z91.81 (ICD-10-CM) - History of recent fall G20.C (ICD-10-CM) - Primary parkinsonism  THERAPY DIAG:  Other abnormalities of gait and mobility  Muscle weakness (generalized)  Unsteadiness on feet  Rationale for Evaluation and Treatment: Rehabilitation  SUBJECTIVE:  SUBJECTIVE STATEMENT: Got rained out this weekend. Tried some of the sit to stands at home. Wife reports that he went to the gym and did a little exercise.   Pt accompanied by: significant other  PERTINENT HISTORY: PD, complex medical history of peptic ulcer disease with history of upper GI bleed, TIA, depression, degenerative disc disease in the neck, osteoarthritis, kidney stones, hyperlipidemia, hypertension, history of hiatal hernia, BPH, arthritis, and ankylosing spondylitis, and parkinsonism  PAIN:  Are you having pain? No  PRECAUTIONS: None  WEIGHT BEARING RESTRICTIONS: No  FALLS: Has patient fallen in last 6 months? Yes. Number of falls "several"  LIVING ENVIRONMENT: Lives with: lives with their spouse Lives in: House/apartment Stairs:  stairs inside home to loft, ramp to enter/exit home Has following equipment at home: Gilford Rile - 2 wheeled, since the fall in October  PLOF: [place]: return to PLOF  OBJECTIVE:      TODAY'S TREATMENT: 09/23/22 Activity Comments  Sitting reaching to touch stool 10x    STS reaching for target in front from 21 in mat10x  Heavy verbal and manual cueing to encourarage anterior translation of chest forward; occasional min A  supine chin retractions into towel 10x Very limited ROM  Sitting horizontal ABD TB Discontinued d/t poor form   Sitting scapular retraction 10x Required verbal and manual cueing for proper form   pec stretch in doorway 30" each side Very unsteady and requiring assist for positioning/safety   sidestepping with big steps 6x length of counter Cueing to maintain hips neutral and complete in 6-8 steps   backwards walking with big steps 6x length of counter Encouraged larger steps throughout  Alt posterior step with 1 UE on counter Heavy verbal cueing to maintain alternating pattern throughout      [place] Last updated: 07/24/22 Access Code: [email or web address] URL: [email or web address] Date: 07/24/2022 Prepared by: [facility]  Program Notes perform with gait belt and wife's supervision  Exercises - Standing Balance in Corner  - 1 x daily - 7 x weekly - 3 sets - 30 sec hold - Narrow Stance with Counter Support  - 1 x daily - 5 x weekly - 2 sets - 10 reps - 30 sec hold - Standing Balance in Corner with Eyes Closed  - 1 x daily - 5 x weekly - 2 sets - 10 reps - 30 sec hold - Sit to Stand with Counter Support  - 1 x daily - 5 x weekly - 2 sets - 5-10 reps - Supine Lower Trunk Rotation  - 1 x daily - 7 x weekly - 3 sets - 10 reps - Supine Bridge  - 1 x daily - 7 x weekly - 3 sets - 10 reps - Thoracic Extension Mobilization on Foam Roll  - 1 x daily - 7 x weekly - 3 sets - 10 reps - 3 sec hold - Seated Shoulder Horizontal Abduction with Resistance  - 1 x daily - 7 x weekly - 3 sets - 10 reps   Below measures were taken at time of initial evaluation unless otherwise specified:   DIAGNOSTIC FINDINGS: no intracranial or osseous injuries  COGNITION: Overall cognitive status: Within functional limits for tasks assessed   SENSATION: WFL  COORDINATION: Alternating movements impaired, right ankle limitations pre-morbid from hx of crush injury in childhood Heel to shin mildly impaired right > left Finger to nose WNL Finger opposition: unable to  perform bilaterally  MUSCLE TONE: NT  MUSCLE LENGTH: Presents with bilateral knee flexion contractures, approx 5-10 degrees  DTRs:    POSTURE: rounded shoulders, forward head, and increased thoracic kyphosis  LOWER EXTREMITY ROM:     Active  Right Eval Left Eval  Hip flexion    Hip extension    Hip abduction    Hip adduction    Hip internal rotation    Hip external rotation    Knee flexion    Knee extension 0 0  Ankle dorsiflexion 5 10  Ankle plantarflexion    Ankle inversion    Ankle eversion     (Blank rows = not tested)  LOWER EXTREMITY MMT:  assessed in sitting  MMT Right Eval Left Eval  Hip flexion 4 4  Hip extension    Hip abduction 3+ 3+  Hip adduction 4 4  Hip internal rotation    Hip external rotation    Knee flexion 4 4  Knee extension 4 4  Ankle dorsiflexion 3 3+  Ankle plantarflexion    Ankle inversion    Ankle eversion    (Blank rows = not tested)  BED MOBILITY:  Modified Indep  TRANSFERS: Modified indep to set-up level  Floor:  DNT  RAMP:  NT  CURB:  Level of Assistance: CGA-SBA Assistive device utilized: Environmental consultant - 2 wheeled Curb Comments:   STAIRS: SBA-CGA with HR  GAIT: Gait pattern:  right knee extensor thrust in stance, shuffling, and trunk flexed, deficits during turning Distance walked:  Assistive device utilized: Environmental consultant - 2 wheeled Level of assistance: Modified independence and SBA Comments: unsteady, deficits during turns  FUNCTIONAL TESTS:  5 times sit to stand: 25 sec Timed up and go (TUG): 20-27 sec Push and release test (# of steps): Anterior: 1-2; Posterior multiple small steps lead to LOB   Berg Balance Test: 43/56    GOALS: Goals reviewed with patient? Yes  SHORT TERM GOALS: Target date: 10/03/2022      Patient will perform advanced HEP with caregiver supervision to address balance, gait, postural deviations, and functional strength deficits Baseline: met initial HEP Goal status: IN PROGRESS    2.  Demonstrate/report independence in household ambulation (no AD) to restore capabilities to PLOF Baseline: modified indep w/ RW household distances Goal status: IN PROGRESS  3.  Demo improved safety with ambulation and reduce risk for falls per TUG test 18 sec w/ or without AD Baseline: 20-27 sec Goal status: IN PROGRESS   LONG TERM GOALS: Target date: 10/31/2022      Demonstrate reduce risk for falls per time 15 sec TUG test with least restrictive AD Baseline: Goal status: IN PROGRESS  2.  Demo/report ability to ambulate uneven surfaces with walking stick in order to return to walking activity with spouse Baseline:  Goal status: IN PROGRESS  3.  Demonstrate improved balance and low risk for falls per [employer] Test score of 49/56 Baseline: 43/56 Goal status: IN PROGRESS     ASSESSMENT:  CLINICAL IMPRESSION: Patient arrived to session with partial compliance with HEP sine last session. Reviewed STS transfers with focus on anterior translation of trunk which was practiced last session; patient with poor carryover and slightly more difficulty performing today compared to last session. Worked on postural activities with cueing for proper movement pattern and encouraging larger amplitude throughout. Patient required cueing throughout alternating stepping activity today d/t difficulty coordinating this pattern. No complaints at end of session.     OBJECTIVE IMPAIRMENTS: Abnormal gait, decreased activity tolerance, decreased  balance, decreased coordination, decreased knowledge of use of DME, difficulty walking, decreased ROM, decreased strength, dizziness, impaired flexibility, improper body mechanics, and postural dysfunction.   ACTIVITY LIMITATIONS: carrying, lifting, bending, standing, squatting, stairs, transfers, bed mobility, bathing, dressing, reach over head, and locomotion level  PARTICIPATION LIMITATIONS: meal prep, cleaning, laundry, shopping, community activity, and  yard work  PERSONAL FACTORS: Age, Time since onset of injury/illness/exacerbation, and 3+ comorbidities: see medical hx  are also affecting patient's functional outcome.   REHAB POTENTIAL: Good  CLINICAL DECISION MAKING: Evolving/moderate complexity  EVALUATION COMPLEXITY: Moderate  PLAN:  PT FREQUENCY: 2x/week  PT DURATION: other: 7 weeks  recommend additional 15 sessions  PLANNED INTERVENTIONS: Therapeutic exercises, Therapeutic activity, Neuromuscular re-education, Balance training, Gait training, Patient/Family education, Self Care, Joint mobilization, Stair training, Vestibular training, Canalith repositioning, DME instructions, Aquatic Therapy, Dry Needling, Electrical stimulation, Wheelchair mobility training, Spinal mobilization, Cryotherapy, Moist heat, Taping, and Manual therapy  PLAN FOR NEXT SESSION: Continue balance strategies, but work on wider BOS, staggered stance positions and use of hip strategy, step strategies for balance.  HEP updates for postural re-education (e.g. supine chin retractions)   Janene Harvey, PT, DPT 09/23/22 12:40 PM  [facility] [address], [address] [place], [place] [address] Phone # [phone] Fax # [phone]

## 2022-09-23 ENCOUNTER — Ambulatory Visit: Payer: Medicare Other | Admitting: Occupational Therapy

## 2022-09-23 ENCOUNTER — Encounter: Payer: Self-pay | Admitting: Physical Therapy

## 2022-09-23 ENCOUNTER — Ambulatory Visit: Payer: Medicare Other | Admitting: Physical Therapy

## 2022-09-23 DIAGNOSIS — R293 Abnormal posture: Secondary | ICD-10-CM | POA: Diagnosis not present

## 2022-09-23 DIAGNOSIS — R2681 Unsteadiness on feet: Secondary | ICD-10-CM | POA: Diagnosis not present

## 2022-09-23 DIAGNOSIS — R2689 Other abnormalities of gait and mobility: Secondary | ICD-10-CM

## 2022-09-23 DIAGNOSIS — R29818 Other symptoms and signs involving the nervous system: Secondary | ICD-10-CM | POA: Diagnosis not present

## 2022-09-23 DIAGNOSIS — R278 Other lack of coordination: Secondary | ICD-10-CM | POA: Diagnosis not present

## 2022-09-23 DIAGNOSIS — M6281 Muscle weakness (generalized): Secondary | ICD-10-CM | POA: Diagnosis not present

## 2022-09-23 NOTE — Therapy (Signed)
OUTPATIENT OCCUPATIONAL THERAPY Treatment Session  Patient Name: [name] MRN: [id number] DOB:Aug 05, 1936, 87 y.o., male Today's Date: 09/23/2022  PCP: Donnajean Lopes, MD REFERRING PROVIDER: Star Age, MD     END OF SESSION:  OT End of Session - 09/23/22 1110     Visit Number 18    Number of Visits 25    Date for OT Re-Evaluation 10/25/22    Authorization Type Medicare A &B    OT Start Time 1106    OT Stop Time 6644    OT Time Calculation (min) 40 min                            Past Medical History:  Diagnosis Date   Ankylosing spondylitis ([place]) dx'd ~ 1974   Arthritis    "back" (08/10/2015)   BENIGN PROSTATIC HYPERTROPHY, WITH OBSTRUCTION 01/15/2010   Bleeding duodenal ulcer    Bleeding esophageal ulcer    Bleeding stomach ulcer    GERD 02/22/2008   History of blood transfusion 1988   "lost ~ 1/2 of my blood volume from multiple bleeding ulcers"   History of hiatal hernia    HYPERGLYCEMIA 11/18/2007   HYPERLIPIDEMIA 02/22/2008   HYPERTENSION, UNSPECIFIED 11/10/2009   Kidney stones    Osteoarthritis    c-spine   PEPTIC ULCER DISEASE 11/17/2007   Situational depression    "son died in [place] 07-07-15"   TIA (transient ischemic attack)    "not that I know of in the past; they are trying to determine if I've had one today (08/10/2015)"   Past Surgical History:  Procedure Laterality Date   CATARACT EXTRACTION W/ INTRAOCULAR LENS  IMPLANT, BILATERAL Bilateral 2013   Patient Active Problem List   Diagnosis Date Noted   Ankylosing spondylitis of cervicothoracic region ([place]) 10/16/2016   TIA (transient ischemic attack) 08/10/2015   Carotid stenosis 08/04/2014   Hyperlipidemia 02/21/2012   BENIGN PROSTATIC HYPERTROPHY, WITH OBSTRUCTION 01/15/2010   Essential hypertension 11/10/2009   GERD 02/22/2008   NEPHROLITHIASIS, HX OF 11/17/2007    ONSET DATE: referral date 07/04/22   REFERRING DIAG: G20.C (ICD-10-CM) - Parkinsonism, unspecified  Z91.81 (ICD-10-CM) - History of falling  THERAPY DIAG:  Other abnormalities of gait and mobility  Muscle weakness (generalized)  Unsteadiness on feet  Other lack of coordination  Abnormal posture  Rationale for Evaluation and Treatment: Rehabilitation  SUBJECTIVE:   SUBJECTIVE STATEMENT: Pt's spouse reports that he returned to the exercise class on Friday to a round of applause.  Pt accompanied by: self and significant other  PERTINENT HISTORY: history of Parkinson's and ankylosing spondylitis   PRECAUTIONS: Fall  WEIGHT BEARING RESTRICTIONS: No  PAIN:  Are you having pain? No  FALLS: Has patient fallen in last 6 months? Yes. Number of falls 6 (3-4 since "big fall" in Jul 07, 2023)  LIVING ENVIRONMENT: Lives with: lives with their spouse Lives in: House/apartment Stairs: Yes: External: 2-3 steps; has had a ramp built just in last week Internal: steps to basement and steps to loft - however pt does not need to go up/down those at this time. Has following equipment at home: Gilford Rile - 2 wheeled, bed side commode, Ramped entry, and transport chair  PLOF: Independent  PATIENT GOALS: to be able to feel confident in any sort of movement to resume prior activities  OBJECTIVE:   HAND DOMINANCE: Right  ADLs: Overall ADLs: Prior to fall in Jul 06, 2022, pt Independent with all ADLs and IADLs without  use of AE/DME, even completing shower in standing in walk-in shower. Transfers/ambulation related to ADLs: fluctuates with mobility; now using RW for all mobility, Supervision with all mobility Eating: Mod I Grooming: wife will assist with opening items; otherwise setup and supervision due to balance UB Dressing: Min-mod assist, buttons/zippers are quite challenging LB Dressing: Mod assist, is able to don socks/shoes with increased time Toileting: Max assist Bathing: sponge baths since fall, max assist from wife at this time Tub Shower transfers: has walk-in shower, but does not feel  safe in shower since fall in Oct Equipment: bed side commode  IADLs: Meal Prep: since fall in October 2023, pt has been assisting with 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  Pt currently cutting foods for meal prep in sitting position. Medication management: wife assisting Handwriting:  TBA  MOBILITY STATUS: Needs Assist: CGA with mobility with RW and Hx of falls  POSTURE COMMENTS:  rounded shoulders and forward head  FUNCTIONAL OUTCOME MEASURES: Physical performance test: PPT #4 donning/doffing jacket: unable to complete due to LOB* 5 time sit > stand:  1:05.94 with up to mod assist and cues to scoot forward towards edge of chair and needing to push up from RW.  UPPER EXTREMITY ROM:  all movements are slowed, decreased shoulder flexion d/t ankylosing spondylitis  Active ROM Right eval Left eval  Shoulder flexion 90 90  Shoulder abduction    Shoulder adduction    Shoulder extension    Shoulder internal rotation [facility]  Shoulder external rotation [facility]  Elbow flexion [facility] WFL  Elbow extension [facility] WFL  Wrist flexion    Wrist extension    Wrist ulnar deviation    Wrist radial deviation    Wrist pronation    Wrist supination    (Blank rows = not tested)  UPPER EXTREMITY MMT:     MMT Right eval Left eval  Shoulder flexion 4/5 4/5  Shoulder abduction    Shoulder adduction    Shoulder extension    Shoulder internal rotation    Shoulder external rotation    Middle trapezius    Lower trapezius    Elbow flexion 4/5 4/5  Elbow extension 4/5 4/5  Wrist flexion    Wrist extension    Wrist ulnar deviation    Wrist radial deviation    Wrist pronation    Wrist supination    (Blank rows = not tested)  HAND FUNCTION: TBD  COORDINATION: 09/12/22 9 Hole Peg test: Right: 50.47 sec; Left: able to place 7 pegs in 2 mins time limit ** sec Box and Blocks:  Right 41blocks, Left 36 blocks hitting barrier x2 on RUE and x6 on LUE  COGNITION: Overall cognitive status:  Pt's  spouse reports that his thinking seems slower, but attributes it to stress from all issues presenting s/p fall in October  VISION: Subjective report: glasses broke with recent fall Baseline vision: Wears glasses all the time Visual history: cataracts  VISION ASSESSMENT: Not tested  Patient has difficulty with following activities due to following visual impairments: currently difficult to assess as pt does not have glasses with him, as they broke during fall in October 2023.  OBSERVATIONS: Pt with slowed response time and movements during transfers to/from treatment space and during assessment.   TODAY'S TREATMENT:    09/23/22 FMC: small peg board pattern replication with use of LUE and RUE.  Pt demonstrating significant difficulty with use of LUE with small pegs.  Pt demonstrating difficulty with use of RUE  as well, however L > R. Discussed fluctuations in use of BUE with fine motor control tasks.  Pt's spouse also reports that his hands are more swollen some times than others, voicing possible diet impact. Buttons: Pt attempting to fasten buttons on shirt with significantly increased time.  OT provided instruction on use of button hook with pt demonstrating improvements initially, however continuing to require mod cues and demonstration for success.  Pt to attempt trials at home with button hook and report back at future therapy session.   Community resources: Pt's spouse asking about [employer] Moves classes at [employer].  OT discussed requirement of 0-1 falls in last 6 months, which pt has had more than 1 fall in 6 months.  OT provided education on chair class vs standard class as well as PT guided exercises on [facility] at [facility] vs exercises at [employer] if/when pt a candidate.  OT provided family with OT community and virtual resources to facilitate increased carryover to movement at home.  Pt currently has resumed in person exercise class as well as has had a couple one on one sessions with  instructor at their local gym.   09/19/22 Large amplitude UE exercises: engaged in forearm supination/pronation and wrist flexion/extension.  Pt demonstrating limited ROM with supination, especially on LUE.  Discussed impact of ankylosing spondylitis and importance of maintaining ROM as able. Resistance Clothespins 2,4,6# with RUE and LUE for high functional reaching and sustained pinch. Pt required mod cues for movement pattern and maintaining good upright posture when reaching.  Pt demonstrating increased spontaneous upright posture with more functional task. PNF pattern reaching with visual cue for increased upright posture. OT providing target for pt to reach up towards with bilateral diagonals.    09/17/22 Coordination: w/ LUE/RUE/each UE as appropriate, including: flipping cards one at a time off a deck, picking up a small object and translating palm-to-fingertips, picking up coins and stacking them, and picking up 5-10 coins 1 at a time and translating palm to fingertips to place in coin slot.  OT providing demonstration and mod verbal cues for large amplitude movement and supination during card flipping activity.  OT placed towel on table top to allow for increased friction and ability to pick coins up from table top.  Initially attempted translation with stones however pt with increased difficulty therefore switched to coins for more purposeful task and pt demonstrating improved understanding and technique with coins.     PATIENT EDUCATION: Education details: ongoing condition specific education.  Community resources for PD. Person educated: Patient and Spouse Education method: Explanation, Demonstration, Verbal cues, and Handouts Education comprehension: verbalized understanding and needs further education  HOME EXERCISE PROGRAM: Coordination HEP (see pt instructions)  Access Code: [email or web address] URL: [email or web address] Date: 09/19/2022 Prepared by: [facility]  Exercises - Shoulder PNF D2 Extension  - 1 x daily - 3 x weekly - 2 sets - 10 reps - Standing Shoulder Internal Rotation AROM Behind Back  - 1 x daily - 3 x weekly - 2 sets - 10 reps - Open Book Chest Stretch on Towel Roll  - 1 x daily - 3 x weekly - 2 sets - 10 reps   GOALS: Goals reviewed with patient? Yes  SHORT TERM GOALS: Target date 10/04/22  Pt will be independent in full body HEP with focus on large amplitude movements as needed to increase independence with ADLs. Baseline: Goal status: IN PROGRESS   2.  Pt will complete  UB dressing (except clothing fasteners) with supervision. Baseline: wife assists with buttons occasionally prior to fall Goal status: IN PROGRESS   LONG TERM GOALS: Target date: 10/25/22  1. Pt will demonstrate/report shower transfers with DME PRN at supervision level. Baseline: Pt is now bathing at shower level with use of shower chair, still requiring HHA with transfers out of walk-in shower Goal status: IN PROGRESS  2.  Pt will complete UB dressing, to include clothing fasteners, at Mod I level with use of AE and/or alternative strategies PRN. Baseline: requiring assist for shirt around shoulders occasionally and assist with buttons 25- 50% of time Goal status: IN PROGRESS  3.  Pt will complete LB dressing Mod I with use of AE and/or alternative strategies PRN. Baseline:  still having difficulty with pants and tying shoes Goal status: IN PROGRESS  4. Pt will demonstrate improved UE functional use for ADLs as evidenced by increasing box/ blocks score by 4 blocks with LUE. Baseline: Right 41blocks, Left 36 blocks hitting barrier x2 on RUE and x6 on LUE Goal status: IN PROGRESS  5.  Pt will demonstrate improved dynamic standing balance for 15 mins as needed to engage in ADLs/IADLs with increased safety/endurance.   Baseline:  Goal status: IN PROGRESS  6. Pt will demonstrate increased ease with dressing as evidenced by  decreasing PPT#4(don/ doff jacket) by 10 secs or more.  Baseline: 57.25 sec on 08/23/22  Goal status: IN PROGRESS  ASSESSMENT:  CLINICAL IMPRESSION: Pt demonstrating increased difficulty with coordination with pegs and buttons this session.  Pt with inconsistent results with use of button hook, plan to continue to practice at home to allow for increased ease and independence with UB dressing tasks/clothing fasteners.  Pt making inconsistent progress towards goals, however is beginning to resume participation in exercises classes in local gym.  Pt would benefit from PD specific exercise classes as able.    PERFORMANCE DEFICITS: in functional skills including ADLs, IADLs, ROM, strength, pain, flexibility, Fine motor control, Gross motor control, mobility, balance, body mechanics, endurance, decreased knowledge of precautions, decreased knowledge of use of DME, and UE functional use, cognitive skills including attention, memory, safety awareness, sequencing, and thought, and psychosocial skills including environmental adaptation and routines and behaviors.   IMPAIRMENTS: are limiting patient from ADLs and IADLs.   CO-MORBIDITIES: may have co-morbidities  that affects occupational performance. Patient will benefit from skilled OT to address above impairments and improve overall function.  MODIFICATION OR ASSISTANCE TO COMPLETE EVALUATION: Min-Moderate modification of tasks or assist with assess necessary to complete an evaluation.  OT OCCUPATIONAL PROFILE AND HISTORY: Detailed assessment: Review of records and additional review of physical, cognitive, psychosocial history related to current functional performance.  CLINICAL DECISION MAKING: Moderate - several treatment options, min-mod task modification necessary  REHAB POTENTIAL: Good  EVALUATION COMPLEXITY: Moderate    PLAN:  OT FREQUENCY: 2x/week  OT DURATION: 8 weeks  PLANNED INTERVENTIONS: self care/ADL training, therapeutic  exercise, therapeutic activity, neuromuscular re-education, manual therapy, balance training, functional mobility training, ultrasound, biofeedback, compression bandaging, moist heat, cryotherapy, patient/family education, cognitive remediation/compensation, visual/perceptual remediation/compensation, psychosocial skills training, energy conservation, coping strategies training, and DME and/or AE instructions  RECOMMENDED OTHER SERVICES: N/A  CONSULTED AND AGREED WITH PLAN OF CARE: Patient and family member/caregiver  PLAN FOR NEXT SESSION:  BUE ROM for dressing with focus on large amplitude and posture; Modifications of positioning/alternative strategies for LB dressing, Incorporate coordination activities and large amplitude movements.   Faithe Ariola, [place], OTR/L 09/23/2022, 11:10 AM

## 2022-09-24 NOTE — Therapy (Signed)
OUTPATIENT PHYSICAL THERAPY NEURO TREATMENT NOTE   Patient Name: [name] MRN: [id number] DOB:Jun 06, 1936, 87 y.o., male Today's Date: 09/25/2022   PCP: Donnajean Lopes, MD REFERRING PROVIDER: Star Age, MD       END OF SESSION:  PT End of Session - 09/25/22 1324     Visit Number 19    Number of Visits 30    Date for PT Re-Evaluation 10/31/22    Authorization Type Medicare    Progress Note Due on Visit 25    PT Start Time 1234   pt in bathroom   PT Stop Time 1322    PT Time Calculation (min) 48 min    Equipment Utilized During Treatment Gait belt    Activity Tolerance Patient tolerated treatment well    Behavior During Therapy WFL for tasks assessed/performed                  Past Medical History:  Diagnosis Date   Ankylosing spondylitis ([place]) dx'd ~ 1974   Arthritis    "back" (08/10/2015)   BENIGN PROSTATIC HYPERTROPHY, WITH OBSTRUCTION 01/15/2010   Bleeding duodenal ulcer    Bleeding esophageal ulcer    Bleeding stomach ulcer    GERD 02/22/2008   History of blood transfusion 1988   "lost ~ 1/2 of my blood volume from multiple bleeding ulcers"   History of hiatal hernia    HYPERGLYCEMIA 11/18/2007   HYPERLIPIDEMIA 02/22/2008   HYPERTENSION, UNSPECIFIED 11/10/2009   Kidney stones    Osteoarthritis    c-spine   PEPTIC ULCER DISEASE 11/17/2007   Situational depression    "son died in [place] June 29, 2015"   TIA (transient ischemic attack)    "not that I know of in the past; they are trying to determine if I've had one today (08/10/2015)"   Past Surgical History:  Procedure Laterality Date   CATARACT EXTRACTION W/ INTRAOCULAR LENS  IMPLANT, BILATERAL Bilateral 2013   Patient Active Problem List   Diagnosis Date Noted   Ankylosing spondylitis of cervicothoracic region ([place]) 10/16/2016   TIA (transient ischemic attack) 08/10/2015   Carotid stenosis 08/04/2014   Hyperlipidemia 02/21/2012   BENIGN PROSTATIC HYPERTROPHY, WITH OBSTRUCTION 01/15/2010    Essential hypertension 11/10/2009   GERD 02/22/2008   NEPHROLITHIASIS, HX OF 11/17/2007    ONSET DATE: 2022-06-28, PD for 4 years  REFERRING DIAG: Z91.81 (ICD-10-CM) - History of recent fall G20.C (ICD-10-CM) - Primary parkinsonism  THERAPY DIAG:  Other abnormalities of gait and mobility  Muscle weakness (generalized)  Unsteadiness on feet  Rationale for Evaluation and Treatment: Rehabilitation  SUBJECTIVE:  SUBJECTIVE STATEMENT: Wife requests extra copy of HEP. Wife reports that patient felt somewhat upset after last session d/t feeling like he was not doing well.   Pt accompanied by: significant other  PERTINENT HISTORY: PD, complex medical history of peptic ulcer disease with history of upper GI bleed, TIA, depression, degenerative disc disease in the neck, osteoarthritis, kidney stones, hyperlipidemia, hypertension, history of hiatal hernia, BPH, arthritis, and ankylosing spondylitis, and parkinsonism  PAIN:  Are you having pain? No  PRECAUTIONS: None  WEIGHT BEARING RESTRICTIONS: No  FALLS: Has patient fallen in last 6 months? Yes. Number of falls "several"  LIVING ENVIRONMENT: Lives with: lives with their spouse Lives in: House/apartment Stairs:  stairs inside home to loft, ramp to enter/exit home Has following equipment at home: Gilford Rile - 2 wheeled, since the fall in October  PLOF: [place]: return to PLOF  OBJECTIVE:     TODAY'S TREATMENT: 09/25/22 Activity Comments  STS reaching for target in front x13 Cueing for set up; slightly improved ability to stand today; 20 inch  lateral wt shifts at counter  Manual cueing to increase amplitude  ant/pos wt shifts at counter Some hesitation in forward directions- cueing to increase amplitude   alt toe tap on  step under sink Able to wean UE support to 1 arm   gait with RW + U turns Focus on large effort long steps; occasional min A d/t posterior imbalance d/t not pushing RW far enough forward    PATIENT EDUCATION: Education details: spoke to wife and patient about remainder of POC and encouraged them to think of activities that can maintain fitness post-DC; provided extra copy of HEP Person educated: Patient and Spouse Education method: Explanation, Demonstration, Tactile cues, Verbal cues, and Handouts Education comprehension: verbalized understanding and returned demonstration   HOME EXERCISE PROGRAM Last updated: 07/24/22 Access Code: [email or web address] URL: [email or web address] Date: 07/24/2022 Prepared by: [facility]  Program Notes perform with gait belt and wife's supervision  Exercises - Standing Balance in Corner  - 1 x daily - 7 x weekly - 3 sets - 30 sec hold - Narrow Stance with Counter Support  - 1 x daily - 5 x weekly - 2 sets - 10 reps - 30 sec hold - Standing Balance in Corner with Eyes Closed  - 1 x daily - 5 x weekly - 2 sets - 10 reps - 30 sec hold - Sit to Stand with Counter Support  - 1 x daily - 5 x weekly - 2 sets - 5-10 reps - Supine Lower Trunk Rotation  - 1 x daily - 7 x weekly - 3 sets - 10 reps - Supine Bridge  - 1 x daily - 7 x weekly - 3 sets - 10 reps - Thoracic Extension Mobilization on Foam Roll  - 1 x daily - 7 x weekly - 3 sets - 10 reps - 3 sec hold - Seated Shoulder Horizontal Abduction with Resistance  - 1 x daily - 7 x weekly - 3 sets - 10 reps    Below measures were taken at time of initial evaluation unless otherwise specified:   DIAGNOSTIC FINDINGS: no intracranial or osseous injuries  COGNITION: Overall cognitive status: Within functional limits for tasks assessed   SENSATION: WFL  COORDINATION: Alternating movements impaired, right ankle limitations pre-morbid from hx of crush injury in  childhood Heel to shin mildly impaired right > left Finger to nose WNL Finger opposition: unable to perform  bilaterally    MUSCLE TONE: NT  MUSCLE LENGTH: Presents with bilateral knee flexion contractures, approx 5-10 degrees  DTRs:    POSTURE: rounded shoulders, forward head, and increased thoracic kyphosis  LOWER EXTREMITY ROM:     Active  Right Eval Left Eval  Hip flexion    Hip extension    Hip abduction    Hip adduction    Hip internal rotation    Hip external rotation    Knee flexion    Knee extension 0 0  Ankle dorsiflexion 5 10  Ankle plantarflexion    Ankle inversion    Ankle eversion     (Blank rows = not tested)  LOWER EXTREMITY MMT:  assessed in sitting  MMT Right Eval Left Eval  Hip flexion 4 4  Hip extension    Hip abduction 3+ 3+  Hip adduction 4 4  Hip internal rotation    Hip external rotation    Knee flexion 4 4  Knee extension 4 4  Ankle dorsiflexion 3 3+  Ankle plantarflexion    Ankle inversion    Ankle eversion    (Blank rows = not tested)  BED MOBILITY:  Modified Indep  TRANSFERS: Modified indep to set-up level  Floor:  DNT  RAMP:  NT  CURB:  Level of Assistance: CGA-SBA Assistive device utilized: Environmental consultant - 2 wheeled Curb Comments:   STAIRS: SBA-CGA with HR  GAIT: Gait pattern:  right knee extensor thrust in stance, shuffling, and trunk flexed, deficits during turning Distance walked:  Assistive device utilized: Environmental consultant - 2 wheeled Level of assistance: Modified independence and SBA Comments: unsteady, deficits during turns  FUNCTIONAL TESTS:  5 times sit to stand: 25 sec Timed up and go (TUG): 20-27 sec Push and release test (# of steps): Anterior: 1-2; Posterior multiple small steps lead to LOB   Berg Balance Test: 43/56    GOALS: Goals reviewed with patient? Yes  SHORT TERM GOALS: Target date: 10/03/2022      Patient will perform advanced HEP with caregiver supervision to address balance, gait, postural  deviations, and functional strength deficits Baseline: met initial HEP Goal status: IN PROGRESS   2.  Demonstrate/report independence in household ambulation (no AD) to restore capabilities to PLOF Baseline: modified indep w/ RW household distances Goal status: IN PROGRESS  3.  Demo improved safety with ambulation and reduce risk for falls per TUG test 18 sec w/ or without AD Baseline: 20-27 sec Goal status: IN PROGRESS   LONG TERM GOALS: Target date: 10/31/2022      Demonstrate reduce risk for falls per time 15 sec TUG test with least restrictive AD Baseline: Goal status: IN PROGRESS  2.  Demo/report ability to ambulate uneven surfaces with walking stick in order to return to walking activity with spouse Baseline:  Goal status: IN PROGRESS  3.  Demonstrate improved balance and low risk for falls per [employer] Test score of 49/56 Baseline: 43/56 Goal status: IN PROGRESS     ASSESSMENT:  CLINICAL IMPRESSION: Patient arrived to session without complaints. Worked on review of STS transfers, again with focus on anterior trunk lean with slightly improved ability today from lower surface. Weight shifting activities responded to manual and verbal cues to increase amplitude and control movements with use of ankle strategy. Gait training focused on large effortful steps with cueing for safe handling of walker to avoid posterior LOB. Education provided on continuing fitness after D/C. Patient reported understanding of all edu provided and without complaints at  end of session.     OBJECTIVE IMPAIRMENTS: Abnormal gait, decreased activity tolerance, decreased balance, decreased coordination, decreased knowledge of use of DME, difficulty walking, decreased ROM, decreased strength, dizziness, impaired flexibility, improper body mechanics, and postural dysfunction.   ACTIVITY LIMITATIONS: carrying, lifting, bending, standing, squatting, stairs, transfers, bed mobility, bathing, dressing,  reach over head, and locomotion level  PARTICIPATION LIMITATIONS: meal prep, cleaning, laundry, shopping, community activity, and yard work  PERSONAL FACTORS: Age, Time since onset of injury/illness/exacerbation, and 3+ comorbidities: see medical hx  are also affecting patient's functional outcome.   REHAB POTENTIAL: Good  CLINICAL DECISION MAKING: Evolving/moderate complexity  EVALUATION COMPLEXITY: Moderate  PLAN:  PT FREQUENCY: 2x/week  PT DURATION: other: 7 weeks  recommend additional 15 sessions  PLANNED INTERVENTIONS: Therapeutic exercises, Therapeutic activity, Neuromuscular re-education, Balance training, Gait training, Patient/Family education, Self Care, Joint mobilization, Stair training, Vestibular training, Canalith repositioning, DME instructions, Aquatic Therapy, Dry Needling, Electrical stimulation, Wheelchair mobility training, Spinal mobilization, Cryotherapy, Moist heat, Taping, and Manual therapy  PLAN FOR NEXT SESSION: start to talk to patient and wife about gym equipment that pt can use safely and wife requesting ideas on equipment that may be used at home. Continue balance strategies, but work on wider BOS, staggered stance positions and use of hip strategy, step strategies for balance.  HEP updates for postural re-education (e.g. supine chin retractions)   Janene Harvey, PT, DPT 09/25/22 1:27 PM  [facility] [address] [place], [place], [place] [address] Phone # [phone] Fax # [phone]

## 2022-09-25 ENCOUNTER — Ambulatory Visit: Payer: Medicare Other | Admitting: Physical Therapy

## 2022-09-25 ENCOUNTER — Ambulatory Visit: Payer: Medicare Other | Admitting: Occupational Therapy

## 2022-09-25 ENCOUNTER — Encounter: Payer: Self-pay | Admitting: Physical Therapy

## 2022-09-25 DIAGNOSIS — R278 Other lack of coordination: Secondary | ICD-10-CM | POA: Diagnosis not present

## 2022-09-25 DIAGNOSIS — R293 Abnormal posture: Secondary | ICD-10-CM | POA: Diagnosis not present

## 2022-09-25 DIAGNOSIS — R2681 Unsteadiness on feet: Secondary | ICD-10-CM

## 2022-09-25 DIAGNOSIS — R29818 Other symptoms and signs involving the nervous system: Secondary | ICD-10-CM

## 2022-09-25 DIAGNOSIS — M6281 Muscle weakness (generalized): Secondary | ICD-10-CM | POA: Diagnosis not present

## 2022-09-25 DIAGNOSIS — R2689 Other abnormalities of gait and mobility: Secondary | ICD-10-CM

## 2022-09-25 NOTE — Therapy (Signed)
OUTPATIENT OCCUPATIONAL THERAPY Treatment Session  Patient Name: NAYSON TRAWEEK MRN: [id number] DOB:16-Feb-1936, 87 y.o., male Today's Date: 09/25/2022  PCP: Donnajean Lopes, MD REFERRING PROVIDER: Star Age, MD     END OF SESSION:  OT End of Session - 09/25/22 1151     Visit Number 19    Number of Visits 25    Date for OT Re-Evaluation 10/25/22    Authorization Type Medicare A &B    OT Start Time 2458    OT Stop Time 0998    OT Time Calculation (min) 42 min                             Past Medical History:  Diagnosis Date   Ankylosing spondylitis ([place]) dx'd ~ 1974   Arthritis    "back" (08/10/2015)   BENIGN PROSTATIC HYPERTROPHY, WITH OBSTRUCTION 01/15/2010   Bleeding duodenal ulcer    Bleeding esophageal ulcer    Bleeding stomach ulcer    GERD 02/22/2008   History of blood transfusion 1988   "lost ~ 1/2 of my blood volume from multiple bleeding ulcers"   History of hiatal hernia    HYPERGLYCEMIA 11/18/2007   HYPERLIPIDEMIA 02/22/2008   HYPERTENSION, UNSPECIFIED 11/10/2009   Kidney stones    Osteoarthritis    c-spine   PEPTIC ULCER DISEASE 11/17/2007   Situational depression    "son died in [place] 06-21-15"   TIA (transient ischemic attack)    "not that I know of in the past; they are trying to determine if I've had one today (08/10/2015)"   Past Surgical History:  Procedure Laterality Date   CATARACT EXTRACTION W/ INTRAOCULAR LENS  IMPLANT, BILATERAL Bilateral 2013   Patient Active Problem List   Diagnosis Date Noted   Ankylosing spondylitis of cervicothoracic region ([place]) 10/16/2016   TIA (transient ischemic attack) 08/10/2015   Carotid stenosis 08/04/2014   Hyperlipidemia 02/21/2012   BENIGN PROSTATIC HYPERTROPHY, WITH OBSTRUCTION 01/15/2010   Essential hypertension 11/10/2009   GERD 02/22/2008   NEPHROLITHIASIS, HX OF 11/17/2007    ONSET DATE: referral date 07/04/22   REFERRING DIAG: G20.C (ICD-10-CM) - Parkinsonism, unspecified  Z91.81 (ICD-10-CM) - History of falling  THERAPY DIAG:  Other abnormalities of gait and mobility  Muscle weakness (generalized)  Unsteadiness on feet  Other lack of coordination  Abnormal posture  Other symptoms and signs involving the nervous system  Rationale for Evaluation and Treatment: Rehabilitation  SUBJECTIVE:   SUBJECTIVE STATEMENT: Pt reports no changes.  Pt accompanied by: self and significant other  PERTINENT HISTORY: history of Parkinson's and ankylosing spondylitis   PRECAUTIONS: Fall  WEIGHT BEARING RESTRICTIONS: No  PAIN:  Are you having pain? No  FALLS: Has patient fallen in last 6 months? Yes. Number of falls 6 (3-4 since "big fall" in 06-21-2023)  LIVING ENVIRONMENT: Lives with: lives with their spouse Lives in: House/apartment Stairs: Yes: External: 2-3 steps; has had a ramp built just in last week Internal: steps to basement and steps to loft - however pt does not need to go up/down those at this time. Has following equipment at home: Gilford Rile - 2 wheeled, bed side commode, Ramped entry, and transport chair  PLOF: Independent  PATIENT GOALS: to be able to feel confident in any sort of movement to resume prior activities  OBJECTIVE:   HAND DOMINANCE: Right  ADLs: Overall ADLs: Prior to fall in 2022-06-20, pt Independent with all ADLs and IADLs without use of AE/DME,  even completing shower in standing in walk-in shower. Transfers/ambulation related to ADLs: fluctuates with mobility; now using RW for all mobility, Supervision with all mobility Eating: Mod I Grooming: wife will assist with opening items; otherwise setup and supervision due to balance UB Dressing: Min-mod assist, buttons/zippers are quite challenging LB Dressing: Mod assist, is able to don socks/shoes with increased time Toileting: Max assist Bathing: sponge baths since fall, max assist from wife at this time Tub Shower transfers: has walk-in shower, but does not feel safe in shower  since fall in Oct Equipment: bed side commode  IADLs: Meal Prep: since fall in October 2023, pt has been assisting with 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  Pt currently cutting foods for meal prep in sitting position. Medication management: wife assisting Handwriting:  TBA  MOBILITY STATUS: Needs Assist: CGA with mobility with RW and Hx of falls  POSTURE COMMENTS:  rounded shoulders and forward head  FUNCTIONAL OUTCOME MEASURES: Physical performance test: PPT #4 donning/doffing jacket: unable to complete due to LOB* 5 time sit > stand:  1:05.94 with up to mod assist and cues to scoot forward towards edge of chair and needing to push up from RW.  UPPER EXTREMITY ROM:  all movements are slowed, decreased shoulder flexion d/t ankylosing spondylitis  Active ROM Right eval Left eval  Shoulder flexion 90 90  Shoulder abduction    Shoulder adduction    Shoulder extension    Shoulder internal rotation [facility]  Shoulder external rotation [facility]  Elbow flexion [facility] Rhonda W WFL  Elbow extension [facility] WFL  Wrist flexion    Wrist extension    Wrist ulnar deviation    Wrist radial deviation    Wrist pronation    Wrist supination    (Blank rows = not tested)  UPPER EXTREMITY MMT:     MMT Right eval Left eval  Shoulder flexion 4/5 4/5  Shoulder abduction    Shoulder adduction    Shoulder extension    Shoulder internal rotation    Shoulder external rotation    Middle trapezius    Lower trapezius    Elbow flexion 4/5 4/5  Elbow extension 4/5 4/5  Wrist flexion    Wrist extension    Wrist ulnar deviation    Wrist radial deviation    Wrist pronation    Wrist supination    (Blank rows = not tested)  HAND FUNCTION: TBD  COORDINATION: 09/12/22 9 Hole Peg test: Right: 50.47 sec; Left: able to place 7 pegs in 2 mins time limit ** sec Box and Blocks:  Right 41blocks, Left 36 blocks hitting barrier x2 on RUE and x6 on LUE  COGNITION: Overall cognitive status:  Pt's spouse reports that  his thinking seems slower, but attributes it to stress from all issues presenting s/p fall in October  VISION: Subjective report: glasses broke with recent fall Baseline vision: Wears glasses all the time Visual history: cataracts  VISION ASSESSMENT: Not tested  Patient has difficulty with following activities due to following visual impairments: currently difficult to assess as pt does not have glasses with him, as they broke during fall in October 2023.  OBSERVATIONS: Pt with slowed response time and movements during transfers to/from treatment space and during assessment.   TODAY'S TREATMENT:    09/25/22 Therapeutic exercise: engaged in supine PNF pattern diagonals and open book stretch with focus on increased arm opening and extension.  OT providing mod-max multimodal cues for positioning and technique.  OT providing cues to  relate exercise to function to increase carryover.  Progressed to upright sitting exercises with focus on upright sitting posture with OT providing support at back to simulate sitting in straight back chair.  Pt completing chest press with 1# medicine ball reaching towards target to facilitate increased technique.  Pt continues to require multimodal cues and increased time during exercises, despite repetition.  Box and blocks: RUE: 38 blocks and LUE: 38 blocks    09/23/22 FMC: small peg board pattern replication with use of LUE and RUE.  Pt demonstrating significant difficulty with use of LUE with small pegs.  Pt demonstrating difficulty with use of RUE as well, however L > R. Discussed fluctuations in use of BUE with fine motor control tasks.  Pt's spouse also reports that his hands are more swollen some times than others, voicing possible diet impact. Buttons: Pt attempting to fasten buttons on shirt with significantly increased time.  OT provided instruction on use of button hook with pt demonstrating improvements initially, however continuing to require mod cues and  demonstration for success.  Pt to attempt trials at home with button hook and report back at future therapy session.   Community resources: Pt's spouse asking about [employer] Moves classes at [employer].  OT discussed requirement of 0-1 falls in last 6 months, which pt has had more than 1 fall in 6 months.  OT provided education on chair class vs standard class as well as PT guided exercises on [place] at [facility] vs exercises at [employer] if/when pt a candidate.  OT provided family with OT community and virtual resources to facilitate increased carryover to movement at home.  Pt currently has resumed in person exercise class as well as has had a couple one on one sessions with instructor at their local gym.   09/19/22 Large amplitude UE exercises: engaged in forearm supination/pronation and wrist flexion/extension.  Pt demonstrating limited ROM with supination, especially on LUE.  Discussed impact of ankylosing spondylitis and importance of maintaining ROM as able. Resistance Clothespins 2,4,6# with RUE and LUE for high functional reaching and sustained pinch. Pt required mod cues for movement pattern and maintaining good upright posture when reaching.  Pt demonstrating increased spontaneous upright posture with more functional task. PNF pattern reaching with visual cue for increased upright posture. OT providing target for pt to reach up towards with bilateral diagonals.   PATIENT EDUCATION: Education details: ongoing condition specific education.  Community resources for PD. Person educated: Patient and Spouse Education method: Explanation, Demonstration, Verbal cues, and Handouts Education comprehension: verbalized understanding and needs further education  HOME EXERCISE PROGRAM: Coordination HEP (see pt instructions)  Access Code: [email or web address] URL: [email or web address] Date: 09/25/2022 Prepared by: [facility]  Program Notes Use a target to  facilitate increased reach.  Also if you can relate exercises to functional tasks or even complete functional tasks in similar movement pattern.  Exercises - Open Book Chest Stretch on Towel Roll  - 1 x daily - 3 x weekly - 2 sets - 10 reps - Supine PNF D2  - 2 x daily - 3 x weekly - 3 sets - 10 reps - Shoulder PNF D2 Extension  - 1 x daily - 3 x weekly - 2 sets - 10 reps - Standing Shoulder Internal Rotation AROM Behind Back  - 1 x daily - 3 x weekly - 2 sets - 10 reps - Seated Chest Press  - 1 x daily - 3 x weekly -  2 sets - 10 reps   GOALS: Goals reviewed with patient? Yes  SHORT TERM GOALS: Target date 10/04/22  Pt will be independent in full body HEP with focus on large amplitude movements as needed to increase independence with ADLs. Baseline: Goal status: IN PROGRESS   2.  Pt will complete UB dressing (except clothing fasteners) with supervision. Baseline: wife assists with buttons occasionally prior to fall Goal status: IN PROGRESS   LONG TERM GOALS: Target date: 10/25/22  1. Pt will demonstrate/report shower transfers with DME PRN at supervision level. Baseline: Pt is now bathing at shower level with use of shower chair, still requiring HHA with transfers out of walk-in shower Goal status: IN PROGRESS  2.  Pt will complete UB dressing, to include clothing fasteners, at Mod I level with use of AE and/or alternative strategies PRN. Baseline: requiring assist for shirt around shoulders occasionally and assist with buttons 25- 50% of time Goal status: IN PROGRESS  3.  Pt will complete LB dressing Mod I with use of AE and/or alternative strategies PRN. Baseline:  still having difficulty with pants and tying shoes Goal status: IN PROGRESS  4. Pt will demonstrate improved UE functional use for ADLs as evidenced by increasing box/ blocks score by 4 blocks with LUE. Baseline: Right 41blocks, Left 36 blocks hitting barrier x2 on RUE and x6 on LUE Goal status: IN PROGRESS  5.  Pt  will demonstrate improved dynamic standing balance for 15 mins as needed to engage in ADLs/IADLs with increased safety/endurance.   Baseline:  Goal status: IN PROGRESS  6. Pt will demonstrate increased ease with dressing as evidenced by decreasing PPT#4(don/ doff jacket) by 10 secs or more.  Baseline: 57.25 sec on 08/23/22  Goal status: IN PROGRESS  ASSESSMENT:  CLINICAL IMPRESSION: Pt continues to require mod-max multimodal cues for positioning and technique as well as cues to relate exercise to function to increase carryover.  Pt continues to require increased cues during exercises, despite repetition.  Discussed sitting in upright, straight back chair for increased posture and opening during reaching exercises.   PERFORMANCE DEFICITS: in functional skills including ADLs, IADLs, ROM, strength, pain, flexibility, Fine motor control, Gross motor control, mobility, balance, body mechanics, endurance, decreased knowledge of precautions, decreased knowledge of use of DME, and UE functional use, cognitive skills including attention, memory, safety awareness, sequencing, and thought, and psychosocial skills including environmental adaptation and routines and behaviors.   IMPAIRMENTS: are limiting patient from ADLs and IADLs.   CO-MORBIDITIES: may have co-morbidities  that affects occupational performance. Patient will benefit from skilled OT to address above impairments and improve overall function.  MODIFICATION OR ASSISTANCE TO COMPLETE EVALUATION: Min-Moderate modification of tasks or assist with assess necessary to complete an evaluation.  OT OCCUPATIONAL PROFILE AND HISTORY: Detailed assessment: Review of records and additional review of physical, cognitive, psychosocial history related to current functional performance.  CLINICAL DECISION MAKING: Moderate - several treatment options, min-mod task modification necessary  REHAB POTENTIAL: Good  EVALUATION COMPLEXITY:  Moderate    PLAN:  OT FREQUENCY: 2x/week  OT DURATION: 8 weeks  PLANNED INTERVENTIONS: self care/ADL training, therapeutic exercise, therapeutic activity, neuromuscular re-education, manual therapy, balance training, functional mobility training, ultrasound, biofeedback, compression bandaging, moist heat, cryotherapy, patient/family education, cognitive remediation/compensation, visual/perceptual remediation/compensation, psychosocial skills training, energy conservation, coping strategies training, and DME and/or AE instructions  RECOMMENDED OTHER SERVICES: N/A  CONSULTED AND AGREED WITH PLAN OF CARE: Patient and family member/caregiver  PLAN FOR NEXT SESSION:  BUE ROM  for dressing with focus on large amplitude and posture; Modifications of positioning/alternative strategies for LB dressing, Incorporate coordination activities and large amplitude movements.  Trial button hook.   Simonne Come, OTR/L 09/25/2022, 12:46 PM

## 2022-09-30 ENCOUNTER — Encounter: Payer: Self-pay | Admitting: Physical Therapy

## 2022-09-30 ENCOUNTER — Ambulatory Visit: Payer: Medicare Other | Admitting: Occupational Therapy

## 2022-09-30 ENCOUNTER — Ambulatory Visit: Payer: Medicare Other | Attending: Neurology | Admitting: Physical Therapy

## 2022-09-30 DIAGNOSIS — R293 Abnormal posture: Secondary | ICD-10-CM | POA: Diagnosis not present

## 2022-09-30 DIAGNOSIS — R29818 Other symptoms and signs involving the nervous system: Secondary | ICD-10-CM

## 2022-09-30 DIAGNOSIS — R278 Other lack of coordination: Secondary | ICD-10-CM

## 2022-09-30 DIAGNOSIS — M6281 Muscle weakness (generalized): Secondary | ICD-10-CM | POA: Diagnosis not present

## 2022-09-30 DIAGNOSIS — R2689 Other abnormalities of gait and mobility: Secondary | ICD-10-CM | POA: Insufficient documentation

## 2022-09-30 DIAGNOSIS — R41841 Cognitive communication deficit: Secondary | ICD-10-CM | POA: Diagnosis not present

## 2022-09-30 DIAGNOSIS — G20A2 Parkinson's disease without dyskinesia, with fluctuations: Secondary | ICD-10-CM | POA: Insufficient documentation

## 2022-09-30 DIAGNOSIS — R471 Dysarthria and anarthria: Secondary | ICD-10-CM | POA: Diagnosis not present

## 2022-09-30 DIAGNOSIS — R2681 Unsteadiness on feet: Secondary | ICD-10-CM | POA: Insufficient documentation

## 2022-09-30 DIAGNOSIS — R262 Difficulty in walking, not elsewhere classified: Secondary | ICD-10-CM | POA: Diagnosis not present

## 2022-09-30 NOTE — Therapy (Signed)
OUTPATIENT PHYSICAL THERAPY NEURO TREATMENT NOTE   Patient Name: [name] MRN: [id number] DOB:05/10/1936, 87 y.o., male Today's Date: 10/01/2022   PCP: Donnajean Lopes, MD REFERRING PROVIDER: Star Age, MD       END OF SESSION:  PT End of Session - 09/30/22 1453     Visit Number 20    Number of Visits 30    Date for PT Re-Evaluation 10/31/22    Authorization Type Medicare    Progress Note Due on Visit 25    PT Start Time 1451    PT Stop Time 1534    PT Time Calculation (min) 43 min    Equipment Utilized During Treatment Gait belt    Activity Tolerance Patient tolerated treatment well    Behavior During Therapy WFL for tasks assessed/performed                   Past Medical History:  Diagnosis Date   Ankylosing spondylitis ([place]) dx'd ~ 1974   Arthritis    "back" (08/10/2015)   BENIGN PROSTATIC HYPERTROPHY, WITH OBSTRUCTION 01/15/2010   Bleeding duodenal ulcer    Bleeding esophageal ulcer    Bleeding stomach ulcer    GERD 02/22/2008   History of blood transfusion 1988   "lost ~ 1/2 of my blood volume from multiple bleeding ulcers"   History of hiatal hernia    HYPERGLYCEMIA 11/18/2007   HYPERLIPIDEMIA 02/22/2008   HYPERTENSION, UNSPECIFIED 11/10/2009   Kidney stones    Osteoarthritis    c-spine   PEPTIC ULCER DISEASE 11/17/2007   Situational depression    "son died in [place] 06/16/15"   TIA (transient ischemic attack)    "not that I know of in the past; they are trying to determine if I've had one today (08/10/2015)"   Past Surgical History:  Procedure Laterality Date   CATARACT EXTRACTION W/ INTRAOCULAR LENS  IMPLANT, BILATERAL Bilateral 2013   Patient Active Problem List   Diagnosis Date Noted   Ankylosing spondylitis of cervicothoracic region ([place]) 10/16/2016   TIA (transient ischemic attack) 08/10/2015   Carotid stenosis 08/04/2014   Hyperlipidemia 02/21/2012   BENIGN PROSTATIC HYPERTROPHY, WITH OBSTRUCTION 01/15/2010   Essential  hypertension 11/10/2009   GERD 02/22/2008   NEPHROLITHIASIS, HX OF 11/17/2007    ONSET DATE: 06-15-2022, PD for 4 years  REFERRING DIAG: Z91.81 (ICD-10-CM) - History of recent fall G20.C (ICD-10-CM) - Primary parkinsonism  THERAPY DIAG:  Other abnormalities of gait and mobility  Unsteadiness on feet  Muscle weakness (generalized)  Rationale for Evaluation and Treatment: Rehabilitation  SUBJECTIVE:  SUBJECTIVE STATEMENT: Feel like my biggest problem is the Parkinson's is pulling me backwards.  Have been trying to do some walking without the walker.  Pt accompanied by: significant other  PERTINENT HISTORY: PD, complex medical history of peptic ulcer disease with history of upper GI bleed, TIA, depression, degenerative disc disease in the neck, osteoarthritis, kidney stones, hyperlipidemia, hypertension, history of hiatal hernia, BPH, arthritis, and ankylosing spondylitis, and parkinsonism  PAIN:  Are you having pain? No  Slight residual pain in R ribs  PRECAUTIONS: None  WEIGHT BEARING RESTRICTIONS: No  FALLS: Has patient fallen in last 6 months? Yes. Number of falls "several"  LIVING ENVIRONMENT: Lives with: lives with their spouse Lives in: House/apartment Stairs:  stairs inside home to loft, ramp to enter/exit home Has following equipment at home: Gilford Rile - 2 wheeled, since the fall in October  PLOF: [place]: return to PLOF  OBJECTIVE:    TODAY'S TREATMENT: 09/30/2022 Activity Comments  STS at mat, reaching forward to target 10 reps, then additional 5 reps, CGA Cues for foot position, increased forward lean  Standing at chair with mirror in front:  -Wide BOS lateral weightshifting, progressing to: -lateral weightshift with single arm lift -lateral weightshift  with lift one leg from the floor to improve SLS -Side step together R and L Cues for posture, cues for technique Pt does a good job Tax inspector for visual feedback with posture  Stagger stance forward/back weightshifting with chair support Cues to hinge at hips for decreased retropulsion   TUG activity with no device, 4 reps with CGA Initial good step length, decreased foot clearance with turns  Gait at end of session 40 ft, 50 ft with RW Cues for increased step length:  to stay within BOS of walker, cues to count steps for increased step length and foot clearance         PATIENT EDUCATION: Education details: ways to take longer step length:  positioning in walker, counting steps; cues to continue to use walker primarily, for safety Person educated: Patient and Spouse Education method: Explanation, Demonstration, Tactile cues, Verbal cues, and Handouts Education comprehension: verbalized understanding and returned demonstration   HOME EXERCISE PROGRAM Last updated: 07/24/22 Access Code: [email or web address] URL: [email or web address] Date: 07/24/2022 Prepared by: [facility]  Program Notes perform with gait belt and wife's supervision  Exercises - Standing Balance in Corner  - 1 x daily - 7 x weekly - 3 sets - 30 sec hold - Narrow Stance with Counter Support  - 1 x daily - 5 x weekly - 2 sets - 10 reps - 30 sec hold - Standing Balance in Corner with Eyes Closed  - 1 x daily - 5 x weekly - 2 sets - 10 reps - 30 sec hold - Sit to Stand with Counter Support  - 1 x daily - 5 x weekly - 2 sets - 5-10 reps - Supine Lower Trunk Rotation  - 1 x daily - 7 x weekly - 3 sets - 10 reps - Supine Bridge  - 1 x daily - 7 x weekly - 3 sets - 10 reps - Thoracic Extension Mobilization on Foam Roll  - 1 x daily - 7 x weekly - 3 sets - 10 reps - 3 sec hold - Seated Shoulder Horizontal Abduction with Resistance  - 1 x daily - 7 x weekly - 3 sets - 10  reps    Below measures were taken at time  of initial evaluation unless otherwise specified:   DIAGNOSTIC FINDINGS: no intracranial or osseous injuries  COGNITION: Overall cognitive status: Within functional limits for tasks assessed   SENSATION: WFL  COORDINATION: Alternating movements impaired, right ankle limitations pre-morbid from hx of crush injury in childhood Heel to shin mildly impaired right > left Finger to nose WNL Finger opposition: unable to perform bilaterally    MUSCLE TONE: NT  MUSCLE LENGTH: Presents with bilateral knee flexion contractures, approx 5-10 degrees  DTRs:    POSTURE: rounded shoulders, forward head, and increased thoracic kyphosis  LOWER EXTREMITY ROM:     Active  Right Eval Left Eval  Hip flexion    Hip extension    Hip abduction    Hip adduction    Hip internal rotation    Hip external rotation    Knee flexion    Knee extension 0 0  Ankle dorsiflexion 5 10  Ankle plantarflexion    Ankle inversion    Ankle eversion     (Blank rows = not tested)  LOWER EXTREMITY MMT:  assessed in sitting  MMT Right Eval Left Eval  Hip flexion 4 4  Hip extension    Hip abduction 3+ 3+  Hip adduction 4 4  Hip internal rotation    Hip external rotation    Knee flexion 4 4  Knee extension 4 4  Ankle dorsiflexion 3 3+  Ankle plantarflexion    Ankle inversion    Ankle eversion    (Blank rows = not tested)  BED MOBILITY:  Modified Indep  TRANSFERS: Modified indep to set-up level  Floor:  DNT  RAMP:  NT  CURB:  Level of Assistance: CGA-SBA Assistive device utilized: Environmental consultant - 2 wheeled Curb Comments:   STAIRS: SBA-CGA with HR  GAIT: Gait pattern:  right knee extensor thrust in stance, shuffling, and trunk flexed, deficits during turning Distance walked:  Assistive device utilized: Environmental consultant - 2 wheeled Level of assistance: Modified independence and SBA Comments: unsteady, deficits during turns  FUNCTIONAL TESTS:  5  times sit to stand: 25 sec Timed up and go (TUG): 20-27 sec Push and release test (# of steps): Anterior: 1-2; Posterior multiple small steps lead to LOB   Berg Balance Test: 43/56    GOALS: Goals reviewed with patient? Yes  SHORT TERM GOALS: Target date: 10/03/2022      Patient will perform advanced HEP with caregiver supervision to address balance, gait, postural deviations, and functional strength deficits Baseline: met initial HEP Goal status: IN PROGRESS   2.  Demonstrate/report independence in household ambulation (no AD) to restore capabilities to PLOF Baseline: modified indep w/ RW household distances Goal status: IN PROGRESS  3.  Demo improved safety with ambulation and reduce risk for falls per TUG test 18 sec w/ or without AD Baseline: 20-27 sec Goal status: IN PROGRESS   LONG TERM GOALS: Target date: 10/31/2022      Demonstrate reduce risk for falls per time 15 sec TUG test with least restrictive AD Baseline: Goal status: IN PROGRESS  2.  Demo/report ability to ambulate uneven surfaces with walking stick in order to return to walking activity with spouse Baseline:  Goal status: IN PROGRESS  3.  Demonstrate improved balance and low risk for falls per [employer] Test score of 49/56 Baseline: 43/56 Goal status: IN PROGRESS     ASSESSMENT:  CLINICAL IMPRESSION: Skilled PT session today focused on sit to stand transfers as well as standing weightshifting activities.  Pt appears to  have less posterior lean today with transfers and with standing activities.  Wife and pt report using a wider BOS and staggered foot position are helping some with slightly less posterior tendency at home.  He responds well to visual cues of mirror for posture feedback.  He continues to benefit from use of RW to help with optimal stability with gait and turns.    OBJECTIVE IMPAIRMENTS: Abnormal gait, decreased activity tolerance, decreased balance, decreased coordination, decreased  knowledge of use of DME, difficulty walking, decreased ROM, decreased strength, dizziness, impaired flexibility, improper body mechanics, and postural dysfunction.   ACTIVITY LIMITATIONS: carrying, lifting, bending, standing, squatting, stairs, transfers, bed mobility, bathing, dressing, reach over head, and locomotion level  PARTICIPATION LIMITATIONS: meal prep, cleaning, laundry, shopping, community activity, and yard work  PERSONAL FACTORS: Age, Time since onset of injury/illness/exacerbation, and 3+ comorbidities: see medical hx  are also affecting patient's functional outcome.   REHAB POTENTIAL: Good  CLINICAL DECISION MAKING: Evolving/moderate complexity  EVALUATION COMPLEXITY: Moderate  PLAN:  PT FREQUENCY: 2x/week  PT DURATION: other: 7 weeks  recommend additional 15 sessions  PLANNED INTERVENTIONS: Therapeutic exercises, Therapeutic activity, Neuromuscular re-education, Balance training, Gait training, Patient/Family education, Self Care, Joint mobilization, Stair training, Vestibular training, Canalith repositioning, DME instructions, Aquatic Therapy, Dry Needling, Electrical stimulation, Wheelchair mobility training, Spinal mobilization, Cryotherapy, Moist heat, Taping, and Manual therapy  PLAN FOR NEXT SESSION: Continue to follow up on conversation to patient and wife about gym equipment that pt can use safely and wife requesting ideas on equipment that may be used at home.  Continue balance strategies, but work on wider BOS, staggered stance positions and use of hip strategy, step strategies for balance.  HEP updates for postural re-education and standing weightshifting.  Mady Haagensen, PT 10/01/22 8:03 AM Phone: [phone] Fax: [phone]   [facility] [place], [address] [place], [place] [address] Phone # [phone] Fax # [phone]

## 2022-09-30 NOTE — Patient Instructions (Signed)
Dressing strategies: When putting on underwear/pants, dress weaker leg first.  Try utilizing step stool when threading pant legs one at a time. Attempt rocking foot forward to lift heel to advance pants over heel.   When putting on button up shirt, try buttoning lower portion of buttons first then putting shirt on like pull over shirt. When putting on pull over shirt, put both arms in first and then pull shirt over head.

## 2022-09-30 NOTE — Therapy (Signed)
OUTPATIENT OCCUPATIONAL THERAPY Treatment Session  Patient Name: [name] MRN: [id number] DOB:[birth year], 87 y.o., male Today's Date: 09/30/2022  PCP: Donnajean Lopes, MD REFERRING PROVIDER: Star Age, MD  Occupational Therapy Progress Note  Dates of Reporting Period: 08/23/22 to 09/30/22  Goal Update: Pt continues to demonstrate decreased fine and gross motor control impacting ease and independence with ADLs.  Pt is limited in ROM due to ankylosing spondylitis in addition to PD impacting quality of movement to allow for increased ease and independence with aspects of dressing.  Pt continues to require repetition, mod verbal and demonstration cues, and functional tasks to increase carryover.  Pt has attempted adaptive techniques and use of AE to increase ease with dressing tasks, however due to novelty requires increased time and cues.  Plan: Plan to focus on exercise program and transition back to gym while focusing on quality of movement, adaptive technique/AE, and repetition to increase carry over to home with ADLs.     END OF SESSION:  OT End of Session - 09/30/22 1528     Visit Number 20    Number of Visits 25    Date for OT Re-Evaluation 10/25/22    Authorization Type Medicare A &B    OT Start Time 70    OT Stop Time 0630    OT Time Calculation (min) 43 min                              Past Medical History:  Diagnosis Date   Ankylosing spondylitis ([place]) dx'd ~ 1974   Arthritis    "back" (08/10/2015)   BENIGN PROSTATIC HYPERTROPHY, WITH OBSTRUCTION 01/15/2010   Bleeding duodenal ulcer    Bleeding esophageal ulcer    Bleeding stomach ulcer    GERD 02/22/2008   History of blood transfusion 1988   "lost ~ 1/2 of my blood volume from multiple bleeding ulcers"   History of hiatal hernia    HYPERGLYCEMIA 11/18/2007   HYPERLIPIDEMIA 02/22/2008   HYPERTENSION, UNSPECIFIED 11/10/2009   Kidney stones    Osteoarthritis    c-spine   PEPTIC ULCER  DISEASE 11/17/2007   Situational depression    "son died in [place] 04-Jul-2015"   TIA (transient ischemic attack)    "not that I know of in the past; they are trying to determine if I've had one today (08/10/2015)"   Past Surgical History:  Procedure Laterality Date   CATARACT EXTRACTION W/ INTRAOCULAR LENS  IMPLANT, BILATERAL Bilateral 2013   Patient Active Problem List   Diagnosis Date Noted   Ankylosing spondylitis of cervicothoracic region ([place]) 10/16/2016   TIA (transient ischemic attack) 08/10/2015   Carotid stenosis 08/04/2014   Hyperlipidemia 02/21/2012   BENIGN PROSTATIC HYPERTROPHY, WITH OBSTRUCTION 01/15/2010   Essential hypertension 11/10/2009   GERD 02/22/2008   NEPHROLITHIASIS, HX OF 11/17/2007    ONSET DATE: referral date 07/04/22   REFERRING DIAG: G20.C (ICD-10-CM) - Parkinsonism, unspecified Z91.81 (ICD-10-CM) - History of falling  THERAPY DIAG:  Other symptoms and signs involving the nervous system  Abnormal posture  Other lack of coordination  Muscle weakness (generalized)  Unsteadiness on feet  Other abnormalities of gait and mobility  Rationale for Evaluation and Treatment: Rehabilitation  SUBJECTIVE:   SUBJECTIVE STATEMENT: Pt reports if he moves the wrong way, he gets pain in the lower ribcage area.    Pt accompanied by: self and significant other  PERTINENT HISTORY: history of Parkinson's and ankylosing spondylitis  PRECAUTIONS: Fall  WEIGHT BEARING RESTRICTIONS: No  PAIN:  Are you having pain? No  FALLS: Has patient fallen in last 6 months? Yes. Number of falls 6 (3-4 since "big fall" in Oct)  LIVING ENVIRONMENT: Lives with: lives with their spouse Lives in: House/apartment Stairs: Yes: External: 2-3 steps; has had a ramp built just in last week Internal: steps to basement and steps to loft - however pt does not need to go up/down those at this time. Has following equipment at home: Gil[employer] Rile - 2 wheeled, bed side commode, Ramped entry, and  transport chair  PLOF: Independent  PATIENT GOALS: to be able to feel confident in any sort of movement to resume prior activities  OBJECTIVE:   HAND DOMINANCE: Right  ADLs: Overall ADLs: Prior to fall in October 2023, pt Independent with all ADLs and IADLs without use of AE/DME, even completing shower in standing in walk-in shower. Transfers/ambulation related to ADLs: fluctuates with mobility; now using RW for all mobility, Supervision with all mobility Eating: Mod I Grooming: wife will assist with opening items; otherwise setup and supervision due to balance UB Dressing: Min-mod assist, buttons/zippers are quite challenging LB Dressing: Mod assist, is able to don socks/shoes with increased time Toileting: Max assist Bathing: sponge baths since fall, max assist from wife at this time Tub Shower transfers: has walk-in shower, but does not feel safe in shower since fall in Oct Equipment: bed side commode  IADLs: Meal Prep: since fall in October 2023, pt has been assisting with 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  Pt currently cutting foods for meal prep in sitting position. Medication management: wife assisting Handwriting:  TBA  MOBILITY STATUS: Needs Assist: CGA with mobility with RW and Hx of falls  POSTURE COMMENTS:  rounded shoulders and forward head  FUNCTIONAL OUTCOME MEASURES: Physical performance test: PPT #4 donning/doffing jacket: unable to complete due to LOB* 5 time sit > stand:  1:05.94 with up to mod assist and cues to scoot forward towards edge of chair and needing to push up from RW.  UPPER EXTREMITY ROM:  all movements are slowed, decreased shoulder flexion d/t ankylosing spondylitis  Active ROM Right eval Left eval  Shoulder flexion 90 90  Shoulder abduction    Shoulder adduction    Shoulder extension    Shoulder internal rotation [facility]  Shoulder external rotation [facility]  Elbow flexion [facility] WFL  Elbow extension [facility] WFL  Wrist flexion    Wrist  extension    Wrist ulnar deviation    Wrist radial deviation    Wrist pronation    Wrist supination    (Blank rows = not tested)  UPPER EXTREMITY MMT:     MMT Right eval Left eval  Shoulder flexion 4/5 4/5  Shoulder abduction    Shoulder adduction    Shoulder extension    Shoulder internal rotation    Shoulder external rotation    Middle trapezius    Lower trapezius    Elbow flexion 4/5 4/5  Elbow extension 4/5 4/5  Wrist flexion    Wrist extension    Wrist ulnar deviation    Wrist radial deviation    Wrist pronation    Wrist supination    (Blank rows = not tested)  HAND FUNCTION: TBD  COORDINATION: 09/12/22 9 Hole Peg test: Right: 50.47 sec; Left: able to place 7 pegs in 2 mins time limit ** sec Box and Blocks:  Right 41blocks, Left 36 blocks hitting barrier x2 on  RUE and x6 on LUE  COGNITION: Overall cognitive status:  Pt's spouse reports that his thinking seems slower, but attributes it to stress from all issues presenting s/p fall in October  VISION: Subjective report: glasses broke with recent fall Baseline vision: Wears glasses all the time Visual history: cataracts  VISION ASSESSMENT: Not tested  Patient has difficulty with following activities due to following visual impairments: currently difficult to assess as pt does not have glasses with him, as they broke during fall in October 2023.  OBSERVATIONS: Pt with slowed response time and movements during transfers to/from treatment space and during assessment.   TODAY'S TREATMENT:    09/30/22 Dynamic standing: with reaching for clothespins, incorporating reaching down lower as well as across midline to challenge balance and balance reactions as pt with recent fall when reaching to pick an item off the floor.  Pt with initial LOB when crossing midline to L, however able to utilize UE support on RW and maintain upright posture.  Pt requiring mod cues for sequencing to incorporate crossing midline, but  physically able to complete with supervision. UB dressing: pt continues to verbalize difficulty with fastening buttons with or without use of button hook.  OT educated on donning button up shirt with pull over technique to increase independence and decrease need for buttoning.  OT providing demonstration and written instructions for pt to attempt at home.  Discussed threading BUE prior to pulling over head to allow for increased ease/independence due to limited UE ROM. LB dressing: Pt reports utilizing stool for putting on socks/shoes and occasionally with pants.  Pt continues to demonstrate decreased sequencing with use of step stool for simulated LB dressing.  Pt will continue to benefit from repetition, practice at home, and BUE on pants to increase reach and symmetry.  OT reiterated recommendation to complete LB dressing from seated position due to high fall risk.   09/25/22 Therapeutic exercise: engaged in supine PNF pattern diagonals and open book stretch with focus on increased arm opening and extension.  OT providing mod-max multimodal cues for positioning and technique.  OT providing cues to relate exercise to function to increase carryover.  Progressed to upright sitting exercises with focus on upright sitting posture with OT providing support at back to simulate sitting in straight back chair.  Pt completing chest press with 1# medicine ball reaching towards target to facilitate increased technique.  Pt continues to require multimodal cues and increased time during exercises, despite repetition.  Box and blocks: RUE: 38 blocks and LUE: 38 blocks    09/23/22 FMC: small peg board pattern replication with use of LUE and RUE.  Pt demonstrating significant difficulty with use of LUE with small pegs.  Pt demonstrating difficulty with use of RUE as well, however L > R. Discussed fluctuations in use of BUE with fine motor control tasks.  Pt's spouse also reports that his hands are more swollen some  times than others, voicing possible diet impact. Buttons: Pt attempting to fasten buttons on shirt with significantly increased time.  OT provided instruction on use of button hook with pt demonstrating improvements initially, however continuing to require mod cues and demonstration for success.  Pt to attempt trials at home with button hook and report back at future therapy session.   Community resources: Pt's spouse asking about [employer]oves classes at [employer].  OT discussed requirement of 0-1 falls in last 6 months, which pt has had more than 1 fall in 6 months.  OT provided education  on chair class vs standard class as well as PT guided exercises on [place] at [facility] vs exercises at [facility] if/when pt a candidate.  OT provided family with OT community and virtual resources to facilitate increased carryover to movement at home.  Pt currently has resumed in person exercise class as well as has had a couple one on one sessions with instructor at their local gym.   PATIENT EDUCATION: Education details: ongoing condition specific education.  Provided handout for recommendations for dressing technique. Person educated: Patient and Spouse Education method: Explanation, Demonstration, Verbal cues, and Handouts Education comprehension: verbalized understanding and needs further education  HOME EXERCISE PROGRAM: Coordination HEP (see pt instructions)  Access Code: [email or web address] URL: [email or web address] Date: 09/25/2022 Prepared by: [facility]  Program Notes Use a target to facilitate increased reach.  Also if you can relate exercises to functional tasks or even complete functional tasks in similar movement pattern.  Exercises - Open Book Chest Stretch on Towel Roll  - 1 x daily - 3 x weekly - 2 sets - 10 reps - Supine PNF D2  - 2 x daily - 3 x weekly - 3 sets - 10 reps - Shoulder PNF D2 Extension  - 1 x daily - 3 x weekly - 2 sets - 10 reps -  Standing Shoulder Internal Rotation AROM Behind Back  - 1 x daily - 3 x weekly - 2 sets - 10 reps - Seated Chest Press  - 1 x daily - 3 x weekly - 2 sets - 10 reps   GOALS: Goals reviewed with patient? Yes  SHORT TERM GOALS: Target date 10/04/22  Pt will be independent in full body HEP with focus on large amplitude movements as needed to increase independence with ADLs. Baseline: Goal status: IN PROGRESS   2.  Pt will complete UB dressing (except clothing fasteners) with supervision. Baseline: wife assists with buttons occasionally prior to fall Goal status: IN PROGRESS   LONG TERM GOALS: Target date: 10/25/22  1. Pt will demonstrate/report shower transfers with DME PRN at supervision level. Baseline: Pt is now bathing at shower level with use of shower chair, still requiring HHA with transfers out of walk-in shower Goal status: IN PROGRESS  2.  Pt will complete UB dressing, to include clothing fasteners, at Mod I level with use of AE and/or alternative strategies PRN. Baseline: requiring assist for shirt around shoulders occasionally and assist with buttons 25- 50% of time Goal status: IN PROGRESS  3.  Pt will complete LB dressing Mod I with use of AE and/or alternative strategies PRN. Baseline:  still having difficulty with pants and tying shoes Goal status: IN PROGRESS  4. Pt will demonstrate improved UE functional use for ADLs as evidenced by increasing box/ blocks score by 4 blocks with LUE. Baseline: Right 41blocks, Left 36 blocks hitting barrier x2 on RUE and x6 on LUE Goal status: IN PROGRESS  5.  Pt will demonstrate improved dynamic standing balance for 15 mins as needed to engage in ADLs/IADLs with increased safety/endurance.   Baseline:  Goal status: IN PROGRESS  6. Pt will demonstrate increased ease with dressing as evidenced by decreasing PPT#4(don/ doff jacket) by 10 secs or more.  Baseline: 57.25 sec on 08/23/22  Goal status: IN  PROGRESS  ASSESSMENT:  CLINICAL IMPRESSION: Pt continues to require mod multimodal cues for technique with constant verbal cues fading to min cues for sequencing with therapeutic activity.  Pt with limited  carryover of adaptive techniques to increase ease/independence at home.  OT provided with written instructions to facilitate increased carryover.  PERFORMANCE DEFICITS: in functional skills including ADLs, IADLs, ROM, strength, pain, flexibility, Fine motor control, Gross motor control, mobility, balance, body mechanics, endurance, decreased knowledge of precautions, decreased knowledge of use of DME, and UE functional use, cognitive skills including attention, memory, safety awareness, sequencing, and thought, and psychosocial skills including environmental adaptation and routines and behaviors.   IMPAIRMENTS: are limiting patient from ADLs and IADLs.   CO-MORBIDITIES: may have co-morbidities  that affects occupational performance. Patient will benefit from skilled OT to address above impairments and improve overall function.  MODIFICATION OR ASSISTANCE TO COMPLETE EVALUATION: Min-Moderate modification of tasks or assist with assess necessary to complete an evaluation.  OT OCCUPATIONAL PROFILE AND HISTORY: Detailed assessment: Review of records and additional review of physical, cognitive, psychosocial history related to current functional performance.  CLINICAL DECISION MAKING: Moderate - several treatment options, min-mod task modification necessary  REHAB POTENTIAL: Good  EVALUATION COMPLEXITY: Moderate    PLAN:  OT FREQUENCY: 2x/week  OT DURATION: 8 weeks  PLANNED INTERVENTIONS: self care/ADL training, therapeutic exercise, therapeutic activity, neuromuscular re-education, manual therapy, balance training, functional mobility training, ultrasound, biofeedback, compression bandaging, moist heat, cryotherapy, patient/family education, cognitive remediation/compensation,  visual/perceptual remediation/compensation, psychosocial skills training, energy conservation, coping strategies training, and DME and/or AE instructions  RECOMMENDED OTHER SERVICES: N/A  CONSULTED AND AGREED WITH PLAN OF CARE: Patient and family member/caregiver  PLAN FOR NEXT SESSION:  BUE ROM for dressing with focus on large amplitude and posture; Modifications of positioning/alternative strategies for LB dressing, Incorporate coordination activities and large amplitude movements.  Trial button hook.   Simonne Come, OTR/L 09/30/2022, 3:29 PM

## 2022-10-01 ENCOUNTER — Encounter: Payer: Self-pay | Admitting: Physical Therapy

## 2022-10-01 NOTE — Therapy (Signed)
OUTPATIENT PHYSICAL THERAPY NEURO TREATMENT NOTE   Patient Name: [name] MRN: [id number] DOB:1936-05-28, 87 y.o., male Today's Date: 10/02/2022   PCP: Donnajean Lopes, MD REFERRING PROVIDER: Star Age, MD       END OF SESSION:  PT End of Session - 10/02/22 1537     Visit Number 21    Number of Visits 30    Date for PT Re-Evaluation 10/31/22    Authorization Type Medicare    Progress Note Due on Visit 25    PT Start Time 1446    PT Stop Time 1531    PT Time Calculation (min) 45 min    Equipment Utilized During Treatment Gait belt    Activity Tolerance Patient tolerated treatment well    Behavior During Therapy WFL for tasks assessed/performed                    Past Medical History:  Diagnosis Date   Ankylosing spondylitis ([place]) dx'd ~ 1974   Arthritis    "back" (08/10/2015)   BENIGN PROSTATIC HYPERTROPHY, WITH OBSTRUCTION 01/15/2010   Bleeding duodenal ulcer    Bleeding esophageal ulcer    Bleeding stomach ulcer    GERD 02/22/2008   History of blood transfusion 1988   "lost ~ 1/2 of my blood volume from multiple bleeding ulcers"   History of hiatal hernia    HYPERGLYCEMIA 11/18/2007   HYPERLIPIDEMIA 02/22/2008   HYPERTENSION, UNSPECIFIED 11/10/2009   Kidney stones    Osteoarthritis    c-spine   PEPTIC ULCER DISEASE 11/17/2007   Situational depression    "son died in [place] 06/29/2015"   TIA (transient ischemic attack)    "not that I know of in the past; they are trying to determine if I've had one today (08/10/2015)"   Past Surgical History:  Procedure Laterality Date   CATARACT EXTRACTION W/ INTRAOCULAR LENS  IMPLANT, BILATERAL Bilateral 2013   Patient Active Problem List   Diagnosis Date Noted   Ankylosing spondylitis of cervicothoracic region ([place]) 10/16/2016   TIA (transient ischemic attack) 08/10/2015   Carotid stenosis 08/04/2014   Hyperlipidemia 02/21/2012   BENIGN PROSTATIC HYPERTROPHY, WITH OBSTRUCTION 01/15/2010   Essential  hypertension 11/10/2009   GERD 02/22/2008   NEPHROLITHIASIS, HX OF 11/17/2007    ONSET DATE: Jun 28, 2022, PD for 4 years  REFERRING DIAG: Z91.81 (ICD-10-CM) - History of recent fall G20.C (ICD-10-CM) - Primary parkinsonism  THERAPY DIAG:  Other symptoms and signs involving the nervous system  Abnormal posture  Muscle weakness (generalized)  Unsteadiness on feet  Rationale for Evaluation and Treatment: Rehabilitation  SUBJECTIVE:  SUBJECTIVE STATEMENT: Reports that the walker is catching as he walks- this started this week. Denies falls since last session. Reports that wife sees some bruised spots on the R side of the ribs/bottom since his last fall.   Pt accompanied by: significant other  PERTINENT HISTORY: PD, complex medical history of peptic ulcer disease with history of upper GI bleed, TIA, depression, degenerative disc disease in the neck, osteoarthritis, kidney stones, hyperlipidemia, hypertension, history of hiatal hernia, BPH, arthritis, and ankylosing spondylitis, and parkinsonism  PAIN:  Are you having pain? No  Slight residual pain in R ribs  PRECAUTIONS: None  WEIGHT BEARING RESTRICTIONS: No  FALLS: Has patient fallen in last 6 months? Yes. Number of falls "several"  LIVING ENVIRONMENT: Lives with: lives with their spouse Lives in: House/apartment Stairs:  stairs inside home to loft, ramp to enter/exit home Has following equipment at home: Gilford Rile - 2 wheeled, since the fall in October  PLOF: [place]: return to PLOF  OBJECTIVE:      TODAY'S TREATMENT: 10/02/22 Activity Comments  1 foot on 4" step lift offs 10x each Able to wean to no UE or 2 fingertip support; more difficulty on R LE  1 foot on 4" step alt UE raise Unable to maintain balance with R  foot stabilizing; cueing for larger amplitude stretch with arm movements with L foot stabilizing   step ups 5x each LE 1 UE support; continuous verbal cueing to maintain proper foot sequencing; cueing for posture   standing on incline Difficulty reversing posterior qt shift; pt fearful; improved slightly after practice on level surface with foam roll  Standing forward wt shifts to touch belly to foam roll  Good understanding    PATIENT EDUCATION: Education details: edu on benefits of ST; update to HEP- to be performed at counter and with wife's supervsion  Person educated: Patient and Spouse Education method: Explanation, Demonstration, Tactile cues, Verbal cues, and Handouts Education comprehension: verbalized understanding and returned demonstration   HOME EXERCISE PROGRAM Last updated: 10/02/21 Access Code: [email or web address] URL: [email or web address] Date: 10/02/2022 Prepared by: [facility]  Program Notes perform with gait belt and wife's supervision  Exercises - Standing Balance in Corner  - 1 x daily - 7 x weekly - 3 sets - 30 sec hold - Narrow Stance with Counter Support  - 1 x daily - 5 x weekly - 2 sets - 10 reps - 30 sec hold - Standing Balance in Corner with Eyes Closed  - 1 x daily - 5 x weekly - 2 sets - 10 reps - 30 sec hold - Sit to Stand with Counter Support  - 1 x daily - 5 x weekly - 2 sets - 5-10 reps - Supine Lower Trunk Rotation  - 1 x daily - 7 x weekly - 3 sets - 10 reps - Supine Bridge  - 1 x daily - 7 x weekly - 3 sets - 10 reps - Thoracic Extension Mobilization on Foam Roll  - 1 x daily - 7 x weekly - 3 sets - 10 reps - 3 sec hold - Seated Shoulder Horizontal Abduction with Resistance  - 1 x daily - 7 x weekly - 3 sets - 10 reps - Forward Backward Weight Shift with Counter Support  - 1 x daily - 5 x weekly - 2 sets - 10 reps    Below measures were taken at time of initial evaluation unless otherwise  specified:  DIAGNOSTIC FINDINGS: no intracranial or osseous injuries  COGNITION: Overall cognitive status: Within functional limits for tasks assessed   SENSATION: WFL  COORDINATION: Alternating movements impaired, right ankle limitations pre-morbid from hx of crush injury in childhood Heel to shin mildly impaired right > left Finger to nose WNL Finger opposition: unable to perform bilaterally    MUSCLE TONE: NT  MUSCLE LENGTH: Presents with bilateral knee flexion contractures, approx 5-10 degrees  DTRs:    POSTURE: rounded shoulders, forward head, and increased thoracic kyphosis  LOWER EXTREMITY ROM:     Active  Right Eval Left Eval  Hip flexion    Hip extension    Hip abduction    Hip adduction    Hip internal rotation    Hip external rotation    Knee flexion    Knee extension 0 0  Ankle dorsiflexion 5 10  Ankle plantarflexion    Ankle inversion    Ankle eversion     (Blank rows = not tested)  LOWER EXTREMITY MMT:  assessed in sitting  MMT Right Eval Left Eval  Hip flexion 4 4  Hip extension    Hip abduction 3+ 3+  Hip adduction 4 4  Hip internal rotation    Hip external rotation    Knee flexion 4 4  Knee extension 4 4  Ankle dorsiflexion 3 3+  Ankle plantarflexion    Ankle inversion    Ankle eversion    (Blank rows = not tested)  BED MOBILITY:  Modified Indep  TRANSFERS: Modified indep to set-up level  Floor:  DNT  RAMP:  NT  CURB:  Level of Assistance: CGA-SBA Assistive device utilized: Environmental consultant - 2 wheeled Curb Comments:   STAIRS: SBA-CGA with HR  GAIT: Gait pattern:  right knee extensor thrust in stance, shuffling, and trunk flexed, deficits during turning Distance walked:  Assistive device utilized: Environmental consultant - 2 wheeled Level of assistance: Modified independence and SBA Comments: unsteady, deficits during turns  FUNCTIONAL TESTS:  5 times sit to stand: 25 sec Timed up and go (TUG): 20-27 sec Push and release test (# of  steps): Anterior: 1-2; Posterior multiple small steps lead to LOB   Berg Balance Test: 43/56    GOALS: Goals reviewed with patient? Yes  SHORT TERM GOALS: Target date: 10/03/2022      Patient will perform advanced HEP with caregiver supervision to address balance, gait, postural deviations, and functional strength deficits Baseline: met initial HEP Goal status: IN PROGRESS   2.  Demonstrate/report independence in household ambulation (no AD) to restore capabilities to PLOF Baseline: modified indep w/ RW household distances Goal status: IN PROGRESS  3.  Demo improved safety with ambulation and reduce risk for falls per TUG test 18 sec w/ or without AD Baseline: 20-27 sec Goal status: IN PROGRESS   LONG TERM GOALS: Target date: 10/31/2022      Demonstrate reduce risk for falls per time 15 sec TUG test with least restrictive AD Baseline: Goal status: IN PROGRESS  2.  Demo/report ability to ambulate uneven surfaces with walking stick in order to return to walking activity with spouse Baseline:  Goal status: IN PROGRESS  3.  Demonstrate improved balance and low risk for falls per [employer] Test score of 49/56 Baseline: 43/56 Goal status: IN PROGRESS     ASSESSMENT:  CLINICAL IMPRESSION: Patient tolerated session today which focused on SLS activities and weight shifting. Patient responded well to consistent verbal cueing and occasional manual cueing to maintain proper sequencing with  activities. Some hesitancy evident with activities that brought on increased sway/LOB such as standing on incline today. Some improvement in ability to transfer weight anteriorly after practice. Patient and wife agreeable to ST referral as wife reports progressively low voice volume when speaking.  Patient reported understanding of all edu provided and without complaints at end of session.    OBJECTIVE IMPAIRMENTS: Abnormal gait, decreased activity tolerance, decreased balance, decreased  coordination, decreased knowledge of use of DME, difficulty walking, decreased ROM, decreased strength, dizziness, impaired flexibility, improper body mechanics, and postural dysfunction.   ACTIVITY LIMITATIONS: carrying, lifting, bending, standing, squatting, stairs, transfers, bed mobility, bathing, dressing, reach over head, and locomotion level  PARTICIPATION LIMITATIONS: meal prep, cleaning, laundry, shopping, community activity, and yard work  PERSONAL FACTORS: Age, Time since onset of injury/illness/exacerbation, and 3+ comorbidities: see medical hx  are also affecting patient's functional outcome.   REHAB POTENTIAL: Good  CLINICAL DECISION MAKING: Evolving/moderate complexity  EVALUATION COMPLEXITY: Moderate  PLAN:  PT FREQUENCY: 2x/week  PT DURATION: other: 7 weeks  recommend additional 15 sessions  PLANNED INTERVENTIONS: Therapeutic exercises, Therapeutic activity, Neuromuscular re-education, Balance training, Gait training, Patient/Family education, Self Care, Joint mobilization, Stair training, Vestibular training, Canalith repositioning, DME instructions, Aquatic Therapy, Dry Needling, Electrical stimulation, Wheelchair mobility training, Spinal mobilization, Cryotherapy, Moist heat, Taping, and Manual therapy  PLAN FOR NEXT SESSION: Continue to follow up on conversation to patient and wife about gym equipment that pt can use safely and wife requesting ideas on equipment that may be used at home.  Continue balance strategies, but work on wider BOS, staggered stance positions and use of hip strategy, step strategies for balance.  HEP updates for postural re-education and standing weightshifting.   Janene Harvey, PT, DPT 10/02/22 3:42 PM  [facility] [address] [place], [place], [place] [address] Phone # [phone] Fax # [phone]

## 2022-10-02 ENCOUNTER — Ambulatory Visit: Payer: Medicare Other | Admitting: Occupational Therapy

## 2022-10-02 ENCOUNTER — Telehealth: Payer: Self-pay | Admitting: Physical Therapy

## 2022-10-02 ENCOUNTER — Encounter: Payer: Self-pay | Admitting: Physical Therapy

## 2022-10-02 ENCOUNTER — Ambulatory Visit: Payer: Medicare Other | Admitting: Physical Therapy

## 2022-10-02 DIAGNOSIS — R2681 Unsteadiness on feet: Secondary | ICD-10-CM

## 2022-10-02 DIAGNOSIS — R278 Other lack of coordination: Secondary | ICD-10-CM

## 2022-10-02 DIAGNOSIS — M6281 Muscle weakness (generalized): Secondary | ICD-10-CM

## 2022-10-02 DIAGNOSIS — R29818 Other symptoms and signs involving the nervous system: Secondary | ICD-10-CM

## 2022-10-02 DIAGNOSIS — R293 Abnormal posture: Secondary | ICD-10-CM | POA: Diagnosis not present

## 2022-10-02 DIAGNOSIS — R498 Other voice and resonance disorders: Secondary | ICD-10-CM

## 2022-10-02 DIAGNOSIS — R2689 Other abnormalities of gait and mobility: Secondary | ICD-10-CM | POA: Diagnosis not present

## 2022-10-02 DIAGNOSIS — G20A2 Parkinson's disease without dyskinesia, with fluctuations: Secondary | ICD-10-CM

## 2022-10-02 DIAGNOSIS — R262 Difficulty in walking, not elsewhere classified: Secondary | ICD-10-CM | POA: Diagnosis not present

## 2022-10-02 NOTE — Telephone Encounter (Signed)
Referral placed to ST at [facility] location.

## 2022-10-02 NOTE — Therapy (Signed)
OUTPATIENT OCCUPATIONAL THERAPY Treatment Session  Patient Name: [name] MRN: [id number] DOB:01/04/1936, 87 y.o., male Today's Date: 10/02/2022  PCP: Donnajean Lopes, MD REFERRING PROVIDER: Star Age, MD      END OF SESSION:  OT End of Session - 10/02/22 1608     Visit Number 21    Number of Visits 25    Date for OT Re-Evaluation 10/25/22    Authorization Type Medicare A &B    OT Start Time 5465    OT Stop Time 0354    OT Time Calculation (min) 42 min                               Past Medical History:  Diagnosis Date   Ankylosing spondylitis ([place]) dx'd ~ 1974   Arthritis    "back" (08/10/2015)   BENIGN PROSTATIC HYPERTROPHY, WITH OBSTRUCTION 01/15/2010   Bleeding duodenal ulcer    Bleeding esophageal ulcer    Bleeding stomach ulcer    GERD 02/22/2008   History of blood transfusion 1988   "lost ~ 1/2 of my blood volume from multiple bleeding ulcers"   History of hiatal hernia    HYPERGLYCEMIA 11/18/2007   HYPERLIPIDEMIA 02/22/2008   HYPERTENSION, UNSPECIFIED 11/10/2009   Kidney stones    Osteoarthritis    c-spine   PEPTIC ULCER DISEASE 11/17/2007   Situational depression    "son died in [place] 07/03/2015"   TIA (transient ischemic attack)    "not that I know of in the past; they are trying to determine if I've had one today (08/10/2015)"   Past Surgical History:  Procedure Laterality Date   CATARACT EXTRACTION W/ INTRAOCULAR LENS  IMPLANT, BILATERAL Bilateral 2013   Patient Active Problem List   Diagnosis Date Noted   Ankylosing spondylitis of cervicothoracic region ([place]) 10/16/2016   TIA (transient ischemic attack) 08/10/2015   Carotid stenosis 08/04/2014   Hyperlipidemia 02/21/2012   BENIGN PROSTATIC HYPERTROPHY, WITH OBSTRUCTION 01/15/2010   Essential hypertension 11/10/2009   GERD 02/22/2008   NEPHROLITHIASIS, HX OF 11/17/2007    ONSET DATE: referral date 07/04/22   REFERRING DIAG: G20.C (ICD-10-CM) - Parkinsonism,  unspecified Z91.81 (ICD-10-CM) - History of falling  THERAPY DIAG:  Other symptoms and signs involving the nervous system  Abnormal posture  Other lack of coordination  Muscle weakness (generalized)  Unsteadiness on feet  Rationale for Evaluation and Treatment: Rehabilitation  SUBJECTIVE:   SUBJECTIVE STATEMENT: Pt reports they have been having sewer issues at home, so he had not been able to dedicate time to new dressing strategies.  Pt accompanied by: self and significant other  PERTINENT HISTORY: history of Parkinson's and ankylosing spondylitis   PRECAUTIONS: Fall  WEIGHT BEARING RESTRICTIONS: No  PAIN:  Are you having pain? No  FALLS: Has patient fallen in last 6 months? Yes. Number of falls 6 (3-4 since "big fall" in 07/03/2023)  LIVING ENVIRONMENT: Lives with: lives with their spouse Lives in: House/apartment Stairs: Yes: External: 2-3 steps; has had a ramp built just in last week Internal: steps to basement and steps to loft - however pt does not need to go up/down those at this time. Has following equipment at home: Walker - 2 wheeled, bed side commode, Ramped entry, and transport chair  PLOF: Independent  PATIENT GOALS: to be able to feel confident in any sort of movement to resume prior activities  OBJECTIVE:   HAND DOMINANCE: Right  ADLs: Overall ADLs: Prior to  fall in October 2023, pt Independent with all ADLs and IADLs without use of AE/DME, even completing shower in standing in walk-in shower. Transfers/ambulation related to ADLs: fluctuates with mobility; now using RW for all mobility, Supervision with all mobility Eating: Mod I Grooming: wife will assist with opening items; otherwise setup and supervision due to balance UB Dressing: Min-mod assist, buttons/zippers are quite challenging LB Dressing: Mod assist, is able to don socks/shoes with increased time Toileting: Max assist Bathing: sponge baths since fall, max assist from wife at this time Tub  Shower transfers: has walk-in shower, but does not feel safe in shower since fall in Oct Equipment: bed side commode  IADLs: Meal Prep: since fall in October 2023, pt has been assisting with 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  Pt currently cutting foods for meal prep in sitting position. Medication management: wife assisting Handwriting:  TBA  MOBILITY STATUS: Needs Assist: CGA with mobility with RW and Hx of falls  POSTURE COMMENTS:  rounded shoulders and forward head  FUNCTIONAL OUTCOME MEASURES: Physical performance test: PPT #4 donning/doffing jacket: unable to complete due to LOB* 5 time sit > stand:  1:05.94 with up to mod assist and cues to scoot forward towards edge of chair and needing to push up from RW.  UPPER EXTREMITY ROM:  all movements are slowed, decreased shoulder flexion d/t ankylosing spondylitis  Active ROM Right eval Left eval  Shoulder flexion 90 90  Shoulder abduction    Shoulder adduction    Shoulder extension    Shoulder internal rotation [facility]  Shoulder external rotation [facility]  Elbow flexion [facility] WFL  Elbow extension [facility] WFL  Wrist flexion    Wrist extension    Wrist ulnar deviation    Wrist radial deviation    Wrist pronation    Wrist supination    (Blank rows = not tested)  UPPER EXTREMITY MMT:     MMT Right eval Left eval  Shoulder flexion 4/5 4/5  Shoulder abduction    Shoulder adduction    Shoulder extension    Shoulder internal rotation    Shoulder external rotation    Middle trapezius    Lower trapezius    Elbow flexion 4/5 4/5  Elbow extension 4/5 4/5  Wrist flexion    Wrist extension    Wrist ulnar deviation    Wrist radial deviation    Wrist pronation    Wrist supination    (Blank rows = not tested)  HAND FUNCTION: TBD  COORDINATION: 09/12/22 9 Hole Peg test: Right: 50.47 sec; Left: able to place 7 pegs in 2 mins time limit ** sec Box and Blocks:  Right 41blocks, Left 36 blocks hitting barrier x2 on RUE and x6 on  LUE  COGNITION: Overall cognitive status:  Pt's spouse reports that his thinking seems slower, but attributes it to stress from all issues presenting s/p fall in October  VISION: Subjective report: glasses broke with recent fall Baseline vision: Wears glasses all the time Visual history: cataracts  VISION ASSESSMENT: Not tested  Patient has difficulty with following activities due to following visual impairments: currently difficult to assess as pt does not have glasses with him, as they broke during fall in October 2023.  OBSERVATIONS: Pt with slowed response time and movements during transfers to/from treatment space and during assessment.   TODAY'S TREATMENT:    10/02/22 LB dressing: OT reiterating multiple options for LB dressing with use of step stool, modified circle sitting with one leg  on EOB, use of reacher, and/or figure 4 position.  Pt continues to demonstrate decreased figure 4 with LLE and decreased sequencing and problem solving with attempts to incorporate AE into task.  Encouraged pt and spouse to attempt various strategies to assess which works best for him. Box and blocks: RUE 37 (no hitting barrier), LUE: 35 (hitting barrier x1).  Pt demonstrating slowing bilaterally, however improved quality of movement with decreased hitting of barrier during task.  Discussed functional implications of improved quality of movement while sacrificing quantity.      09/30/22 Dynamic standing: with reaching for clothespins, incorporating reaching down lower as well as across midline to challenge balance and balance reactions as pt with recent fall when reaching to pick an item off the floor.  Pt with initial LOB when crossing midline to L, however able to utilize UE support on RW and maintain upright posture.  Pt requiring mod cues for sequencing to incorporate crossing midline, but physically able to complete with supervision. UB dressing: pt continues to verbalize difficulty with fastening  buttons with or without use of button hook.  OT educated on donning button up shirt with pull over technique to increase independence and decrease need for buttoning.  OT providing demonstration and written instructions for pt to attempt at home.  Discussed threading BUE prior to pulling over head to allow for increased ease/independence due to limited UE ROM. LB dressing: Pt reports utilizing stool for putting on socks/shoes and occasionally with pants.  Pt continues to demonstrate decreased sequencing with use of step stool for simulated LB dressing.  Pt will continue to benefit from repetition, practice at home, and BUE on pants to increase reach and symmetry.  OT reiterated recommendation to complete LB dressing from seated position due to high fall risk.   09/25/22 Therapeutic exercise: engaged in supine PNF pattern diagonals and open book stretch with focus on increased arm opening and extension.  OT providing mod-max multimodal cues for positioning and technique.  OT providing cues to relate exercise to function to increase carryover.  Progressed to upright sitting exercises with focus on upright sitting posture with OT providing support at back to simulate sitting in straight back chair.  Pt completing chest press with 1# medicine ball reaching towards target to facilitate increased technique.  Pt continues to require multimodal cues and increased time during exercises, despite repetition.  Box and blocks: RUE: 38 blocks and LUE: 38 blocks   PATIENT EDUCATION: Education details: ongoing condition specific education.  Provided handout for recommendations for dressing technique. Person educated: Patient and Spouse Education method: Explanation, Demonstration, Verbal cues, and Handouts Education comprehension: verbalized understanding and needs further education  HOME EXERCISE PROGRAM: Coordination HEP (see pt instructions)  Access Code: [email or web address] URL: [email or web address] Date:  09/25/2022 Prepared by: [facility]  Program Notes Use a target to facilitate increased reach.  Also if you can relate exercises to functional tasks or even complete functional tasks in similar movement pattern.  Exercises - Open Book Chest Stretch on Towel Roll  - 1 x daily - 3 x weekly - 2 sets - 10 reps - Supine PNF D2  - 2 x daily - 3 x weekly - 3 sets - 10 reps - Shoulder PNF D2 Extension  - 1 x daily - 3 x weekly - 2 sets - 10 reps - Standing Shoulder Internal Rotation AROM Behind Back  - 1 x daily - 3 x weekly - 2 sets -  10 reps - Seated Chest Press  - 1 x daily - 3 x weekly - 2 sets - 10 reps   GOALS: Goals reviewed with patient? Yes  SHORT TERM GOALS: Target date 10/04/22  Pt will be independent in full body HEP with focus on large amplitude movements as needed to increase independence with ADLs. Baseline: Goal status: Met - 10/02/22   2.  Pt will complete UB dressing (except clothing fasteners) with supervision. Baseline: wife assists with buttons occasionally prior to fall Goal status: Met - 10/02/22   LONG TERM GOALS: Target date: 10/25/22  1. Pt will demonstrate/report shower transfers with DME PRN at supervision level. Baseline: Pt is now bathing at shower level with use of shower chair, still requiring HHA with transfers out of walk-in shower Goal status: IN PROGRESS  2.  Pt will complete UB dressing, to include clothing fasteners, at Mod I level with use of AE and/or alternative strategies PRN. Baseline: requiring assist for shirt around shoulders occasionally and assist with buttons 25- 50% of time Goal status: IN PROGRESS  3.  Pt will complete LB dressing Mod I with use of AE and/or alternative strategies PRN. Baseline:  still having difficulty with pants and tying shoes Goal status: IN PROGRESS  4. Pt will demonstrate improved UE functional use for ADLs as evidenced by increasing box/ blocks score by 4 blocks with  LUE. Baseline: Right 41blocks, Left 36 blocks hitting barrier x2 on RUE and x6 on LUE Goal status: IN PROGRESS  5.  Pt will demonstrate improved dynamic standing balance for 15 mins as needed to engage in ADLs/IADLs with increased safety/endurance.   Baseline:  Goal status: IN PROGRESS  6. Pt will demonstrate increased ease with dressing as evidenced by decreasing PPT#4(don/ doff jacket) by 10 secs or more.  Baseline: 57.25 sec on 08/23/22  Goal status: IN PROGRESS  ASSESSMENT:  CLINICAL IMPRESSION: Pt continues to require mod multimodal cues for LB dressing with novel technique, requiring constant verbal cues fading to min cues for sequencing.  Pt continues with limited carryover of adaptive techniques to increase ease/independence at home, will benefit from additional practice to assess best practice.  Pt demonstrating improved quality of movements with Box and blocks assessment this session, with decreased hitting of barrier, however movements slower overall.  Discussed importance of quality of movements to aid in ADLs and IADLs.  PERFORMANCE DEFICITS: in functional skills including ADLs, IADLs, ROM, strength, pain, flexibility, Fine motor control, Gross motor control, mobility, balance, body mechanics, endurance, decreased knowledge of precautions, decreased knowledge of use of DME, and UE functional use, cognitive skills including attention, memory, safety awareness, sequencing, and thought, and psychosocial skills including environmental adaptation and routines and behaviors.   IMPAIRMENTS: are limiting patient from ADLs and IADLs.   CO-MORBIDITIES: may have co-morbidities  that affects occupational performance. Patient will benefit from skilled OT to address above impairments and improve overall function.  MODIFICATION OR ASSISTANCE TO COMPLETE EVALUATION: Min-Moderate modification of tasks or assist with assess necessary to complete an evaluation.  OT OCCUPATIONAL PROFILE AND  HISTORY: Detailed assessment: Review of records and additional review of physical, cognitive, psychosocial history related to current functional performance.  CLINICAL DECISION MAKING: Moderate - several treatment options, min-mod task modification necessary  REHAB POTENTIAL: Good  EVALUATION COMPLEXITY: Moderate    PLAN:  OT FREQUENCY: 2x/week  OT DURATION: 8 weeks  PLANNED INTERVENTIONS: self care/ADL training, therapeutic exercise, therapeutic activity, neuromuscular re-education, manual therapy, balance training, functional mobility training, ultrasound, biofeedback,  compression bandaging, moist heat, cryotherapy, patient/family education, cognitive remediation/compensation, visual/perceptual remediation/compensation, psychosocial skills training, energy conservation, coping strategies training, and DME and/or AE instructions  RECOMMENDED OTHER SERVICES: N/A  CONSULTED AND AGREED WITH PLAN OF CARE: Patient and family member/caregiver  PLAN FOR NEXT SESSION:  BUE ROM for dressing with focus on large amplitude and posture; Modifications of positioning/alternative strategies for LB dressing, Incorporate coordination activities and large amplitude movements.     Simonne Come, OTR/L 10/02/2022, 4:08 PM

## 2022-10-02 NOTE — Telephone Encounter (Signed)
Dr. Rexene Alberts,  [name] is being seen in OPPT for falls/Parkinson's per your referral. The patient would benefit from ST evaluation for hypophonia.    If you agree, please place an order in [email or web address] in [facility] or fax the order to [phone].  Thank you,  Janene Harvey, PT, DPT 10/02/22 3:44 PM  [facility] at [facility] [address] [place], [place], [place] [address] Phone # [phone] Fax # [phone]

## 2022-10-02 NOTE — Addendum Note (Signed)
Addended by: Star Age on: 10/02/2022 04:03 PM   Modules accepted: Orders

## 2022-10-07 DIAGNOSIS — N401 Enlarged prostate with lower urinary tract symptoms: Secondary | ICD-10-CM | POA: Diagnosis not present

## 2022-10-07 DIAGNOSIS — N138 Other obstructive and reflux uropathy: Secondary | ICD-10-CM | POA: Diagnosis not present

## 2022-10-08 ENCOUNTER — Ambulatory Visit: Payer: Medicare Other | Admitting: Physical Therapy

## 2022-10-08 ENCOUNTER — Encounter: Payer: Self-pay | Admitting: Physical Therapy

## 2022-10-08 ENCOUNTER — Ambulatory Visit: Payer: Medicare Other | Admitting: Occupational Therapy

## 2022-10-08 DIAGNOSIS — R2681 Unsteadiness on feet: Secondary | ICD-10-CM

## 2022-10-08 DIAGNOSIS — R293 Abnormal posture: Secondary | ICD-10-CM

## 2022-10-08 DIAGNOSIS — R2689 Other abnormalities of gait and mobility: Secondary | ICD-10-CM

## 2022-10-08 DIAGNOSIS — M6281 Muscle weakness (generalized): Secondary | ICD-10-CM

## 2022-10-08 DIAGNOSIS — R262 Difficulty in walking, not elsewhere classified: Secondary | ICD-10-CM | POA: Diagnosis not present

## 2022-10-08 DIAGNOSIS — R278 Other lack of coordination: Secondary | ICD-10-CM

## 2022-10-08 DIAGNOSIS — R29818 Other symptoms and signs involving the nervous system: Secondary | ICD-10-CM | POA: Diagnosis not present

## 2022-10-08 NOTE — Therapy (Signed)
OUTPATIENT OCCUPATIONAL THERAPY Treatment Session  Patient Name: [name] MRN: [id number] DOB:01-24-1936, 87 y.o., male Today's Date: 10/08/2022  PCP: Donnajean Lopes, MD REFERRING PROVIDER: Star Age, MD      END OF SESSION:  OT End of Session - 10/08/22 1327     Visit Number 22    Number of Visits 25    Date for OT Re-Evaluation 10/25/22    Authorization Type Medicare A &B    OT Start Time 62    OT Stop Time 1400    OT Time Calculation (min) 40 min                                Past Medical History:  Diagnosis Date   Ankylosing spondylitis ([place]) dx'd ~ 1974   Arthritis    "back" (08/10/2015)   BENIGN PROSTATIC HYPERTROPHY, WITH OBSTRUCTION 01/15/2010   Bleeding duodenal ulcer    Bleeding esophageal ulcer    Bleeding stomach ulcer    GERD 02/22/2008   History of blood transfusion 1988   "lost ~ 1/2 of my blood volume from multiple bleeding ulcers"   History of hiatal hernia    HYPERGLYCEMIA 11/18/2007   HYPERLIPIDEMIA 02/22/2008   HYPERTENSION, UNSPECIFIED 11/10/2009   Kidney stones    Osteoarthritis    c-spine   PEPTIC ULCER DISEASE 11/17/2007   Situational depression    "son died in [place] 06-07-15"   TIA (transient ischemic attack)    "not that I know of in the past; they are trying to determine if I've had one today (08/10/2015)"   Past Surgical History:  Procedure Laterality Date   CATARACT EXTRACTION W/ INTRAOCULAR LENS  IMPLANT, BILATERAL Bilateral 2013   Patient Active Problem List   Diagnosis Date Noted   Ankylosing spondylitis of cervicothoracic region ([place]) 10/16/2016   TIA (transient ischemic attack) 08/10/2015   Carotid stenosis 08/04/2014   Hyperlipidemia 02/21/2012   BENIGN PROSTATIC HYPERTROPHY, WITH OBSTRUCTION 01/15/2010   Essential hypertension 11/10/2009   GERD 02/22/2008   NEPHROLITHIASIS, HX OF 11/17/2007    ONSET DATE: referral date 07/04/22   REFERRING DIAG: G20.C (ICD-10-CM) - Parkinsonism,  unspecified Z91.81 (ICD-10-CM) - History of falling  THERAPY DIAG:  Muscle weakness (generalized)  Unsteadiness on feet  Other lack of coordination  Abnormal posture  Other abnormalities of gait and mobility  Rationale for Evaluation and Treatment: Rehabilitation  SUBJECTIVE:   SUBJECTIVE STATEMENT: Pt reports he almost didn't come to therapy today, as they did not have power or heat this morning.    Pt accompanied by: self and significant other  PERTINENT HISTORY: history of Parkinson's and ankylosing spondylitis   PRECAUTIONS: Fall  WEIGHT BEARING RESTRICTIONS: No  PAIN:  Are you having pain? No  FALLS: Has patient fallen in last 6 months? Yes. Number of falls 6 (3-4 since "big fall" in 07-Jun-2023)  LIVING ENVIRONMENT: Lives with: lives with their spouse Lives in: House/apartment Stairs: Yes: External: 2-3 steps; has had a ramp built just in last week Internal: steps to basement and steps to loft - however pt does not need to go up/down those at this time. Has following equipment at home: Walker - 2 wheeled, bed side commode, Ramped entry, and transport chair  PLOF: Independent  PATIENT GOALS: to be able to feel confident in any sort of movement to resume prior activities  OBJECTIVE:   HAND DOMINANCE: Right  ADLs: Overall ADLs: Prior to fall in 06/07/2023  2023, pt Independent with all ADLs and IADLs without use of AE/DME, even completing shower in standing in walk-in shower. Transfers/ambulation related to ADLs: fluctuates with mobility; now using RW for all mobility, Supervision with all mobility Eating: Mod I Grooming: wife will assist with opening items; otherwise setup and supervision due to balance UB Dressing: Min-mod assist, buttons/zippers are quite challenging LB Dressing: Mod assist, is able to don socks/shoes with increased time Toileting: Max assist Bathing: sponge baths since fall, max assist from wife at this time Tub Shower transfers: has walk-in shower,  but does not feel safe in shower since fall in Oct Equipment: bed side commode  IADLs: Meal Prep: since fall in October 2023, pt has been assisting with 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  Pt currently cutting foods for meal prep in sitting position. Medication management: wife assisting Handwriting:  TBA  MOBILITY STATUS: Needs Assist: CGA with mobility with RW and Hx of falls  POSTURE COMMENTS:  rounded shoulders and forward head  FUNCTIONAL OUTCOME MEASURES: Physical performance test: PPT #4 donning/doffing jacket: unable to complete due to LOB* 5 time sit > stand:  1:05.94 with up to mod assist and cues to scoot forward towards edge of chair and needing to push up from RW.  UPPER EXTREMITY ROM:  all movements are slowed, decreased shoulder flexion d/t ankylosing spondylitis  Active ROM Right eval Left eval  Shoulder flexion 90 90  Shoulder abduction    Shoulder adduction    Shoulder extension    Shoulder internal rotation [facility]  Shoulder external rotation [facility]  Elbow flexion [facility] WFL  Elbow extension [facility] WFL  Wrist flexion    Wrist extension    Wrist ulnar deviation    Wrist radial deviation    Wrist pronation    Wrist supination    (Blank rows = not tested)  UPPER EXTREMITY MMT:     MMT Right eval Left eval  Shoulder flexion 4/5 4/5  Shoulder abduction    Shoulder adduction    Shoulder extension    Shoulder internal rotation    Shoulder external rotation    Middle trapezius    Lower trapezius    Elbow flexion 4/5 4/5  Elbow extension 4/5 4/5  Wrist flexion    Wrist extension    Wrist ulnar deviation    Wrist radial deviation    Wrist pronation    Wrist supination    (Blank rows = not tested)  HAND FUNCTION: TBD  COORDINATION: 09/12/22 9 Hole Peg test: Right: 50.47 sec; Left: able to place 7 pegs in 2 mins time limit ** sec Box and Blocks:  Right 41blocks, Left 36 blocks hitting barrier x2 on RUE and x6 on LUE  COGNITION: Overall cognitive  status:  Pt's spouse reports that his thinking seems slower, but attributes it to stress from all issues presenting s/p fall in October  VISION: Subjective report: glasses broke with recent fall Baseline vision: Wears glasses all the time Visual history: cataracts  VISION ASSESSMENT: Not tested  Patient has difficulty with following activities due to following visual impairments: currently difficult to assess as pt does not have glasses with him, as they broke during fall in October 2023.  OBSERVATIONS: Pt with slowed response time and movements during transfers to/from treatment space and during assessment.   TODAY'S TREATMENT:    10/08/22 Large amplitude: supination/pronation, forearm extension ***  Resistance Clothespins 1-8# with {upper extremity:27334} for {height:27335} functional reaching and sustained pinch. Pt req'd ***  cues for movement pattern and maintaining good positioning of {upper extremity:27334} with reach. ***In standing Mirror with reaching/erasing    10/02/22 LB dressing: OT reiterating multiple options for LB dressing with use of step stool, modified circle sitting with one leg on EOB, use of reacher, and/or figure 4 position.  Pt continues to demonstrate decreased figure 4 with LLE and decreased sequencing and problem solving with attempts to incorporate AE into task.  Encouraged pt and spouse to attempt various strategies to assess which works best for him. Box and blocks: RUE 37 (no hitting barrier), LUE: 35 (hitting barrier x1).  Pt demonstrating slowing bilaterally, however improved quality of movement with decreased hitting of barrier during task.  Discussed functional implications of improved quality of movement while sacrificing quantity.      09/30/22 Dynamic standing: with reaching for clothespins, incorporating reaching down lower as well as across midline to challenge balance and balance reactions as pt with recent fall when reaching to pick an item off the  floor.  Pt with initial LOB when crossing midline to L, however able to utilize UE support on RW and maintain upright posture.  Pt requiring mod cues for sequencing to incorporate crossing midline, but physically able to complete with supervision. UB dressing: pt continues to verbalize difficulty with fastening buttons with or without use of button hook.  OT educated on donning button up shirt with pull over technique to increase independence and decrease need for buttoning.  OT providing demonstration and written instructions for pt to attempt at home.  Discussed threading BUE prior to pulling over head to allow for increased ease/independence due to limited UE ROM. LB dressing: Pt reports utilizing stool for putting on socks/shoes and occasionally with pants.  Pt continues to demonstrate decreased sequencing with use of step stool for simulated LB dressing.  Pt will continue to benefit from repetition, practice at home, and BUE on pants to increase reach and symmetry.  OT reiterated recommendation to complete LB dressing from seated position due to high fall risk.   09/25/22 Therapeutic exercise: engaged in supine PNF pattern diagonals and open book stretch with focus on increased arm opening and extension.  OT providing mod-max multimodal cues for positioning and technique.  OT providing cues to relate exercise to function to increase carryover.  Progressed to upright sitting exercises with focus on upright sitting posture with OT providing support at back to simulate sitting in straight back chair.  Pt completing chest press with 1# medicine ball reaching towards target to facilitate increased technique.  Pt continues to require multimodal cues and increased time during exercises, despite repetition.  Box and blocks: RUE: 38 blocks and LUE: 38 blocks   PATIENT EDUCATION: Education details: ongoing condition specific education.  Provided handout for recommendations for dressing technique. Person  educated: Patient and Spouse Education method: Explanation, Demonstration, Verbal cues, and Handouts Education comprehension: verbalized understanding and needs further education  HOME EXERCISE PROGRAM: Coordination HEP (see pt instructions)  Access Code: [email or web address] URL: [email or web address] Date: 09/25/2022 Prepared by: [facility]  Program Notes Use a target to facilitate increased reach.  Also if you can relate exercises to functional tasks or even complete functional tasks in similar movement pattern.  Exercises - Open Book Chest Stretch on Towel Roll  - 1 x daily - 3 x weekly - 2 sets - 10 reps - Supine PNF D2  - 2 x daily - 3 x weekly - 3 sets - 10  reps - Shoulder PNF D2 Extension  - 1 x daily - 3 x weekly - 2 sets - 10 reps - Standing Shoulder Internal Rotation AROM Behind Back  - 1 x daily - 3 x weekly - 2 sets - 10 reps - Seated Chest Press  - 1 x daily - 3 x weekly - 2 sets - 10 reps   GOALS: Goals reviewed with patient? Yes  SHORT TERM GOALS: Target date 10/04/22  Pt will be independent in full body HEP with focus on large amplitude movements as needed to increase independence with ADLs. Baseline: Goal status: Met - 10/02/22   2.  Pt will complete UB dressing (except clothing fasteners) with supervision. Baseline: wife assists with buttons occasionally prior to fall Goal status: Met - 10/02/22   LONG TERM GOALS: Target date: 10/25/22  1. Pt will demonstrate/report shower transfers with DME PRN at supervision level. Baseline: Pt is now bathing at shower level with use of shower chair, still requiring HHA with transfers out of walk-in shower Goal status: IN PROGRESS  2.  Pt will complete UB dressing, to include clothing fasteners, at Mod I level with use of AE and/or alternative strategies PRN. Baseline: requiring assist for shirt around shoulders occasionally and assist with buttons 25- 50% of time Goal status: IN  PROGRESS  3.  Pt will complete LB dressing Mod I with use of AE and/or alternative strategies PRN. Baseline:  still having difficulty with pants and tying shoes Goal status: IN PROGRESS  4. Pt will demonstrate improved UE functional use for ADLs as evidenced by increasing box/ blocks score by 4 blocks with LUE. Baseline: Right 41blocks, Left 36 blocks hitting barrier x2 on RUE and x6 on LUE Goal status: IN PROGRESS  5.  Pt will demonstrate improved dynamic standing balance for 15 mins as needed to engage in ADLs/IADLs with increased safety/endurance.   Baseline:  Goal status: IN PROGRESS  6. Pt will demonstrate increased ease with dressing as evidenced by decreasing PPT#4(don/ doff jacket) by 10 secs or more.  Baseline: 57.25 sec on 08/23/22  Goal status: IN PROGRESS  ASSESSMENT:  CLINICAL IMPRESSION: Pt continues to require mod multimodal cues for LB dressing with novel technique, requiring constant verbal cues fading to min cues for sequencing.  Pt continues with limited carryover of adaptive techniques to increase ease/independence at home, will benefit from additional practice to assess best practice.  Pt demonstrating improved quality of movements with Box and blocks assessment this session, with decreased hitting of barrier, however movements slower overall.  Discussed importance of quality of movements to aid in ADLs and IADLs.  PERFORMANCE DEFICITS: in functional skills including ADLs, IADLs, ROM, strength, pain, flexibility, Fine motor control, Gross motor control, mobility, balance, body mechanics, endurance, decreased knowledge of precautions, decreased knowledge of use of DME, and UE functional use, cognitive skills including attention, memory, safety awareness, sequencing, and thought, and psychosocial skills including environmental adaptation and routines and behaviors.   IMPAIRMENTS: are limiting patient from ADLs and IADLs.   CO-MORBIDITIES: may have co-morbidities  that  affects occupational performance. Patient will benefit from skilled OT to address above impairments and improve overall function.  MODIFICATION OR ASSISTANCE TO COMPLETE EVALUATION: Min-Moderate modification of tasks or assist with assess necessary to complete an evaluation.  OT OCCUPATIONAL PROFILE AND HISTORY: Detailed assessment: Review of records and additional review of physical, cognitive, psychosocial history related to current functional performance.  CLINICAL DECISION MAKING: Moderate - several treatment options, min-mod task modification necessary  REHAB POTENTIAL: Good  EVALUATION COMPLEXITY: Moderate    PLAN:  OT FREQUENCY: 2x/week  OT DURATION: 8 weeks  PLANNED INTERVENTIONS: self care/ADL training, therapeutic exercise, therapeutic activity, neuromuscular re-education, manual therapy, balance training, functional mobility training, ultrasound, biofeedback, compression bandaging, moist heat, cryotherapy, patient/family education, cognitive remediation/compensation, visual/perceptual remediation/compensation, psychosocial skills training, energy conservation, coping strategies training, and DME and/or AE instructions  RECOMMENDED OTHER SERVICES: N/A  CONSULTED AND AGREED WITH PLAN OF CARE: Patient and family member/caregiver  PLAN FOR NEXT SESSION:  BUE ROM for dressing with focus on large amplitude and posture; Modifications of positioning/alternative strategies for LB dressing, Incorporate coordination activities and large amplitude movements.     Simonne Come, OTR/L 10/08/2022, 1:36 PM

## 2022-10-08 NOTE — Therapy (Signed)
OUTPATIENT PHYSICAL THERAPY NEURO TREATMENT NOTE   Patient Name: [name] MRN: [id number] DOB:08-Dec-1935, 87 y.o., male Today's Date: 10/08/2022   PCP: Donnajean Lopes, MD REFERRING PROVIDER: Star Age, MD       END OF SESSION:  PT End of Session - 10/08/22 1352     Visit Number 22    Number of Visits 30    Date for PT Re-Evaluation 10/31/22    Authorization Type Medicare    Progress Note Due on Visit 25    PT Start Time [id number]    PT Stop Time [id number]    PT Time Calculation (min) 45 min    Equipment Utilized During Treatment Gait belt    Activity Tolerance Patient tolerated treatment well    Behavior During Therapy WFL for tasks assessed/performed                    Past Medical History:  Diagnosis Date   Ankylosing spondylitis ([place]) dx'd ~ 1974   Arthritis    "back" (08/10/2015)   BENIGN PROSTATIC HYPERTROPHY, WITH OBSTRUCTION 01/15/2010   Bleeding duodenal ulcer    Bleeding esophageal ulcer    Bleeding stomach ulcer    GERD 02/22/2008   History of blood transfusion 1988   "lost ~ 1/2 of my blood volume from multiple bleeding ulcers"   History of hiatal hernia    HYPERGLYCEMIA 11/18/2007   HYPERLIPIDEMIA 02/22/2008   HYPERTENSION, UNSPECIFIED 11/10/2009   Kidney stones    Osteoarthritis    c-spine   PEPTIC ULCER DISEASE 11/17/2007   Situational depression    "son died in [place] 06-12-2015"   TIA (transient ischemic attack)    "not that I know of in the past; they are trying to determine if I've had one today (08/10/2015)"   Past Surgical History:  Procedure Laterality Date   CATARACT EXTRACTION W/ INTRAOCULAR LENS  IMPLANT, BILATERAL Bilateral 2013   Patient Active Problem List   Diagnosis Date Noted   Ankylosing spondylitis of cervicothoracic region ([place]) 10/16/2016   TIA (transient ischemic attack) 08/10/2015   Carotid stenosis 08/04/2014   Hyperlipidemia 02/21/2012   BENIGN PROSTATIC HYPERTROPHY, WITH OBSTRUCTION 01/15/2010   Essential  hypertension 11/10/2009   GERD 02/22/2008   NEPHROLITHIASIS, HX OF 11/17/2007    ONSET DATE: 2022/06/11, PD for 4 years  REFERRING DIAG: Z91.81 (ICD-10-CM) - History of recent fall G20.C (ICD-10-CM) - Primary parkinsonism  THERAPY DIAG:  Unsteadiness on feet  Other abnormalities of gait and mobility  Muscle weakness (generalized)  Abnormal posture  Rationale for Evaluation and Treatment: Rehabilitation  SUBJECTIVE:  SUBJECTIVE STATEMENT: No falls since last visit.  Gilford Rile seems to be okay. Pt accompanied by: significant other  PERTINENT HISTORY: PD, complex medical history of peptic ulcer disease with history of upper GI bleed, TIA, depression, degenerative disc disease in the neck, osteoarthritis, kidney stones, hyperlipidemia, hypertension, history of hiatal hernia, BPH, arthritis, and ankylosing spondylitis, and parkinsonism  PAIN:  Are you having pain? No  Slight residual pain in R ribs  PRECAUTIONS: None  WEIGHT BEARING RESTRICTIONS: No  FALLS: Has patient fallen in last 6 months? Yes. Number of falls "several"  LIVING ENVIRONMENT: Lives with: lives with their spouse Lives in: House/apartment Stairs:  stairs inside home to loft, ramp to enter/exit home Has following equipment at home: Gilford Rile - 2 wheeled, since the fall in October  PLOF: [place]: return to PLOF  OBJECTIVE:     TODAY'S TREATMENT: 10/08/2022 Activity Comments  Reviewed standing exercises as part of HEP -ant/posterior weightshifting -feet apart EO/EC 2 x 30" -feet together EO 2 x 30" Pt return demo understanding  Stagger stance forward/back rocking x 10 reps Cues for "hinge" at hips in posterior direction  Forward/back step and weightshift 2 x 10 reps Better stability with BUE support  facing counter than 1 UE support beside counter  TUG:  34.69 sec, 41.37 sec, 33.59 sec  Supervision with RW  Forward step tap>lunge on 6" step, 2 x 5 reps BUE support  1 foot on 6" step, alt UE raise x 5 reps, then with head turns/head nods Difficulty with alt UE raise and maintaining balance       PATIENT EDUCATION: Education details: Stance positions and step strategies to reduce posterior LOB  Person educated: Patient and Spouse Education method: Explanation, Demonstration, Tactile cues, Verbal cues, and Handouts Education comprehension: verbalized understanding and returned demonstration   HOME EXERCISE PROGRAM Last updated: 10/02/21 Access Code: [email or web address] URL: [email or web address] Date: 10/02/2022 Prepared by: [facility]  Program Notes perform with gait belt and wife's supervision  Exercises - Standing Balance in Corner  - 1 x daily - 7 x weekly - 3 sets - 30 sec hold - Narrow Stance with Counter Support  - 1 x daily - 5 x weekly - 2 sets - 10 reps - 30 sec hold - Standing Balance in Corner with Eyes Closed  - 1 x daily - 5 x weekly - 2 sets - 10 reps - 30 sec hold - Sit to Stand with Counter Support  - 1 x daily - 5 x weekly - 2 sets - 5-10 reps - Supine Lower Trunk Rotation  - 1 x daily - 7 x weekly - 3 sets - 10 reps - Supine Bridge  - 1 x daily - 7 x weekly - 3 sets - 10 reps - Thoracic Extension Mobilization on Foam Roll  - 1 x daily - 7 x weekly - 3 sets - 10 reps - 3 sec hold - Seated Shoulder Horizontal Abduction with Resistance  - 1 x daily - 7 x weekly - 3 sets - 10 reps - Forward Backward Weight Shift with Counter Support  - 1 x daily - 5 x weekly - 2 sets - 10 reps    Below measures were taken at time of initial evaluation unless otherwise specified:   DIAGNOSTIC FINDINGS: no intracranial or osseous injuries  COGNITION: Overall cognitive status: Within functional limits for tasks  assessed   SENSATION: WFL  COORDINATION: Alternating movements impaired, right ankle  limitations pre-morbid from hx of crush injury in childhood Heel to shin mildly impaired right > left Finger to nose WNL Finger opposition: unable to perform bilaterally    MUSCLE TONE: NT  MUSCLE LENGTH: Presents with bilateral knee flexion contractures, approx 5-10 degrees  DTRs:    POSTURE: rounded shoulders, forward head, and increased thoracic kyphosis  LOWER EXTREMITY ROM:     Active  Right Eval Left Eval  Hip flexion    Hip extension    Hip abduction    Hip adduction    Hip internal rotation    Hip external rotation    Knee flexion    Knee extension 0 0  Ankle dorsiflexion 5 10  Ankle plantarflexion    Ankle inversion    Ankle eversion     (Blank rows = not tested)  LOWER EXTREMITY MMT:  assessed in sitting  MMT Right Eval Left Eval  Hip flexion 4 4  Hip extension    Hip abduction 3+ 3+  Hip adduction 4 4  Hip internal rotation    Hip external rotation    Knee flexion 4 4  Knee extension 4 4  Ankle dorsiflexion 3 3+  Ankle plantarflexion    Ankle inversion    Ankle eversion    (Blank rows = not tested)  BED MOBILITY:  Modified Indep  TRANSFERS: Modified indep to set-up level  Floor:  DNT  RAMP:  NT  CURB:  Level of Assistance: CGA-SBA Assistive device utilized: Environmental consultant - 2 wheeled Curb Comments:   STAIRS: SBA-CGA with HR  GAIT: Gait pattern:  right knee extensor thrust in stance, shuffling, and trunk flexed, deficits during turning Distance walked:  Assistive device utilized: Environmental consultant - 2 wheeled Level of assistance: Modified independence and SBA Comments: unsteady, deficits during turns  FUNCTIONAL TESTS:  5 times sit to stand: 25 sec Timed up and go (TUG): 20-27 sec Push and release test (# of steps): Anterior: 1-2; Posterior multiple small steps lead to LOB   Berg Balance Test: 43/56    GOALS: Goals reviewed with patient?  Yes  SHORT TERM GOALS: Target date: 10/03/2022      Patient will perform advanced HEP with caregiver supervision to address balance, gait, postural deviations, and functional strength deficits Baseline: met initial HEP Goal status:GOAL MET with updated HEP, 10/08/2022   2.  Demonstrate/report independence in household ambulation (no AD) to restore capabilities to PLOF Baseline: modified indep w/ RW household distances Goal status: GOAL NOT MET, 10/08/2022  3.  Demo improved safety with ambulation and reduce risk for falls per TUG test 18 sec w/ or without AD Baseline: 20-27 sec; 10/08/2022 33.59 sec at best with RW Goal status: GOAL NOT MET 10/08/22   LONG TERM GOALS: Target date: 10/31/2022      Demonstrate reduce risk for falls per time 15 sec TUG test with least restrictive AD Baseline: Goal status: IN PROGRESS  2.  Demo/report ability to ambulate uneven surfaces with walking stick in order to return to walking activity with spouse Baseline:  Goal status: IN PROGRESS  3.  Demonstrate improved balance and low risk for falls per [employer] Test score of 49/56 Baseline: 43/56 Goal status: IN PROGRESS     ASSESSMENT:  CLINICAL IMPRESSION: Assessed STGs this visit, with pt meeting 1 of 3 STGs.  STG 1 met for HEP.  STG 2 not met as pt continues to use RW at home.  STG 3 not met, with TUG score >30 seconds at visit today.  Pt is able to verbalize and demo forward>back step weightshift as strategy to lessen posterior LOB.  Also worked on stagger stance and wide BOS standing as options, with education to wife at end of session.  Also worked on forward [place] with 1 foot on step, not sure safety wise that patient is appropriate for these exercises at home yet.    OBJECTIVE IMPAIRMENTS: Abnormal gait, decreased activity tolerance, decreased balance, decreased coordination, decreased knowledge of use of DME, difficulty walking, decreased ROM, decreased strength, dizziness, impaired  flexibility, improper body mechanics, and postural dysfunction.   ACTIVITY LIMITATIONS: carrying, lifting, bending, standing, squatting, stairs, transfers, bed mobility, bathing, dressing, reach over head, and locomotion level  PARTICIPATION LIMITATIONS: meal prep, cleaning, laundry, shopping, community activity, and yard work  PERSONAL FACTORS: Age, Time since onset of injury/illness/exacerbation, and 3+ comorbidities: see medical hx  are also affecting patient's functional outcome.   REHAB POTENTIAL: Good  CLINICAL DECISION MAKING: Evolving/moderate complexity  EVALUATION COMPLEXITY: Moderate  PLAN:  PT FREQUENCY: 2x/week  PT DURATION: other: 7 weeks  recommend additional 15 sessions  PLANNED INTERVENTIONS: Therapeutic exercises, Therapeutic activity, Neuromuscular re-education, Balance training, Gait training, Patient/Family education, Self Care, Joint mobilization, Stair training, Vestibular training, Canalith repositioning, DME instructions, Aquatic Therapy, Dry Needling, Electrical stimulation, Wheelchair mobility training, Spinal mobilization, Cryotherapy, Moist heat, Taping, and Manual therapy  PLAN FOR NEXT SESSION:  Continue balance strategies, but work on wider BOS, staggered stance positions and use of hip strategy, step strategies for balance.  HEP updates for postural re-education and standing weightshifting. Continue to follow up on conversation to patient and wife about gym equipment that pt can use safely and wife requesting ideas on equipment that may be used at home.   Mady Haagensen, PT 10/08/22 2:52 PM Phone: [phone] Fax: [phone]   [facility] at [facility] [place], [place], [place] [address] Phone # [phone] Fax # [phone]

## 2022-10-08 NOTE — Therapy (Deleted)
[facility] [address], [place], [place], [address] Phone: [phone]   Fax:  [phone]  Patient Details  Name: [name] MRN: [id number] Date of Birth: 1936-03-08 Referring Provider:  Donnajean Lopes, MD  Encounter Date: 10/08/2022   Frazier Butt., PT 10/08/2022, 1:52 PM  [facility] [address] [place], [place], [address] Phone: [phone]   Fax:  [phone]

## 2022-10-11 ENCOUNTER — Ambulatory Visit: Payer: Medicare Other | Admitting: Occupational Therapy

## 2022-10-11 ENCOUNTER — Ambulatory Visit: Payer: Medicare Other

## 2022-10-11 DIAGNOSIS — G20A2 Parkinson's disease without dyskinesia, with fluctuations: Secondary | ICD-10-CM

## 2022-10-11 DIAGNOSIS — M6281 Muscle weakness (generalized): Secondary | ICD-10-CM

## 2022-10-11 DIAGNOSIS — R2689 Other abnormalities of gait and mobility: Secondary | ICD-10-CM

## 2022-10-11 DIAGNOSIS — R293 Abnormal posture: Secondary | ICD-10-CM | POA: Diagnosis not present

## 2022-10-11 DIAGNOSIS — R2681 Unsteadiness on feet: Secondary | ICD-10-CM

## 2022-10-11 DIAGNOSIS — R262 Difficulty in walking, not elsewhere classified: Secondary | ICD-10-CM

## 2022-10-11 DIAGNOSIS — R29818 Other symptoms and signs involving the nervous system: Secondary | ICD-10-CM | POA: Diagnosis not present

## 2022-10-11 NOTE — Therapy (Signed)
OUTPATIENT PHYSICAL THERAPY NEURO TREATMENT NOTE   Patient Name: [name] MRN: [id number] DOB:Dec 25, 1935, 87 y.o., male Today's Date: 10/11/2022   PCP: Donnajean Lopes, MD REFERRING PROVIDER: Star Age, MD       END OF SESSION:  PT End of Session - 10/11/22 1109     Visit Number 23    Number of Visits 30    Date for PT Re-Evaluation 10/31/22    Authorization Type Medicare    Progress Note Due on Visit 25    PT Start Time 1100    PT Stop Time [id number]    PT Time Calculation (min) 45 min    Equipment Utilized During Treatment Gait belt    Activity Tolerance Patient tolerated treatment well    Behavior During Therapy WFL for tasks assessed/performed                    Past Medical History:  Diagnosis Date   Ankylosing spondylitis ([place]) dx'd ~ 1974   Arthritis    "back" (08/10/2015)   BENIGN PROSTATIC HYPERTROPHY, WITH OBSTRUCTION 01/15/2010   Bleeding duodenal ulcer    Bleeding esophageal ulcer    Bleeding stomach ulcer    GERD 02/22/2008   History of blood transfusion 1988   "lost ~ 1/2 of my blood volume from multiple bleeding ulcers"   History of hiatal hernia    HYPERGLYCEMIA 11/18/2007   HYPERLIPIDEMIA 02/22/2008   HYPERTENSION, UNSPECIFIED 11/10/2009   Kidney stones    Osteoarthritis    c-spine   PEPTIC ULCER DISEASE 11/17/2007   Situational depression    "son died in [place] 2015-06-07"   TIA (transient ischemic attack)    "not that I know of in the past; they are trying to determine if I've had one today (08/10/2015)"   Past Surgical History:  Procedure Laterality Date   CATARACT EXTRACTION W/ INTRAOCULAR LENS  IMPLANT, BILATERAL Bilateral 2013   Patient Active Problem List   Diagnosis Date Noted   Ankylosing spondylitis of cervicothoracic region ([place]) 10/16/2016   TIA (transient ischemic attack) 08/10/2015   Carotid stenosis 08/04/2014   Hyperlipidemia 02/21/2012   BENIGN PROSTATIC HYPERTROPHY, WITH OBSTRUCTION 01/15/2010   Essential  hypertension 11/10/2009   GERD 02/22/2008   NEPHROLITHIASIS, HX OF 11/17/2007    ONSET DATE: 06/06/2022, PD for 4 years  REFERRING DIAG: Z91.81 (ICD-10-CM) - History of recent fall G20.C (ICD-10-CM) - Primary parkinsonism  THERAPY DIAG:  Muscle weakness (generalized)  Unsteadiness on feet  Abnormal posture  Other abnormalities of gait and mobility  Parkinson's disease with fluctuating manifestations, unspecified whether dyskinesia present  Difficulty in walking, not elsewhere classified  Rationale for Evaluation and Treatment: Rehabilitation  SUBJECTIVE:  SUBJECTIVE STATEMENT: Feeling pretty good  Pt accompanied by: significant other  PERTINENT HISTORY: PD, complex medical history of peptic ulcer disease with history of upper GI bleed, TIA, depression, degenerative disc disease in the neck, osteoarthritis, kidney stones, hyperlipidemia, hypertension, history of hiatal hernia, BPH, arthritis, and ankylosing spondylitis, and parkinsonism  PAIN:  Are you having pain? No  Slight residual pain in R ribs  PRECAUTIONS: None  WEIGHT BEARING RESTRICTIONS: No  FALLS: Has patient fallen in last 6 months? Yes. Number of falls "several"  LIVING ENVIRONMENT: Lives with: lives with their spouse Lives in: House/apartment Stairs:  stairs inside home to loft, ramp to enter/exit home Has following equipment at home: Gilford Rile - 2 wheeled, since the fall in October  PLOF: [place]: return to PLOF  OBJECTIVE:    TODAY'S TREATMENT: 10/11/22 Activity Comments  LAQ 3x10 4# cues for out-loud counting  Hip add iso 3x10   Hamstring curls 3x10 Yellow band (doublded)  Standing hip abd 2x10 4#  Seated row and lat row 3x10 25#  Sidestepping x 2 min 4#, tactile cues for maintaining toes  forwrad for hip abd/SLS activation  Pre-gait/gait -limb advancement over obstacle 2x10 reps for heel strike initial contact and stride length -Gait training to promote carryover of incr stride via verbal cues and RW 2x75 ft with verbal/visual cues for maintaining stride through turns     TODAY'S TREATMENT: 10/08/2022 Activity Comments  Reviewed standing exercises as part of HEP -ant/posterior weightshifting -feet apart EO/EC 2 x 30" -feet together EO 2 x 30" Pt return demo understanding  Stagger stance forward/back rocking x 10 reps Cues for "hinge" at hips in posterior direction  Forward/back step and weightshift 2 x 10 reps Better stability with BUE support facing counter than 1 UE support beside counter  TUG:  34.69 sec, 41.37 sec, 33.59 sec  Supervision with RW  Forward step tap>lunge on 6" step, 2 x 5 reps BUE support  1 foot on 6" step, alt UE raise x 5 reps, then with head turns/head nods Difficulty with alt UE raise and maintaining balance       PATIENT EDUCATION: Education details: Stance positions and step strategies to reduce posterior LOB  Person educated: Patient and Spouse Education method: Explanation, Demonstration, Tactile cues, Verbal cues, and Handouts Education comprehension: verbalized understanding and returned demonstration   HOME EXERCISE PROGRAM Last updated: 10/02/21 Access Code: [email or web address] URL: [email or web address] Date: 10/02/2022 Prepared by: [facility]  Program Notes perform with gait belt and wife's supervision  Exercises - Standing Balance in Corner  - 1 x daily - 7 x weekly - 3 sets - 30 sec hold - Narrow Stance with Counter Support  - 1 x daily - 5 x weekly - 2 sets - 10 reps - 30 sec hold - Standing Balance in Corner with Eyes Closed  - 1 x daily - 5 x weekly - 2 sets - 10 reps - 30 sec hold - Sit to Stand with Counter Support  - 1 x daily - 5 x weekly - 2 sets - 5-10 reps - Supine Lower Trunk  Rotation  - 1 x daily - 7 x weekly - 3 sets - 10 reps - Supine Bridge  - 1 x daily - 7 x weekly - 3 sets - 10 reps - Thoracic Extension Mobilization on Foam Roll  - 1 x daily - 7 x weekly - 3 sets - 10 reps - 3 sec hold -  Seated Shoulder Horizontal Abduction with Resistance  - 1 x daily - 7 x weekly - 3 sets - 10 reps - Forward Backward Weight Shift with Counter Support  - 1 x daily - 5 x weekly - 2 sets - 10 reps    Below measures were taken at time of initial evaluation unless otherwise specified:   DIAGNOSTIC FINDINGS: no intracranial or osseous injuries  COGNITION: Overall cognitive status: Within functional limits for tasks assessed   SENSATION: WFL  COORDINATION: Alternating movements impaired, right ankle limitations pre-morbid from hx of crush injury in childhood Heel to shin mildly impaired right > left Finger to nose WNL Finger opposition: unable to perform bilaterally    MUSCLE TONE: NT  MUSCLE LENGTH: Presents with bilateral knee flexion contractures, approx 5-10 degrees  DTRs:    POSTURE: rounded shoulders, forward head, and increased thoracic kyphosis  LOWER EXTREMITY ROM:     Active  Right Eval Left Eval  Hip flexion    Hip extension    Hip abduction    Hip adduction    Hip internal rotation    Hip external rotation    Knee flexion    Knee extension 0 0  Ankle dorsiflexion 5 10  Ankle plantarflexion    Ankle inversion    Ankle eversion     (Blank rows = not tested)  LOWER EXTREMITY MMT:  assessed in sitting  MMT Right Eval Left Eval  Hip flexion 4 4  Hip extension    Hip abduction 3+ 3+  Hip adduction 4 4  Hip internal rotation    Hip external rotation    Knee flexion 4 4  Knee extension 4 4  Ankle dorsiflexion 3 3+  Ankle plantarflexion    Ankle inversion    Ankle eversion    (Blank rows = not tested)  BED MOBILITY:  Modified Indep  TRANSFERS: Modified indep to set-up level  Floor:  DNT  RAMP:  NT  CURB:  Level of  Assistance: CGA-SBA Assistive device utilized: Environmental consultant - 2 wheeled Curb Comments:   STAIRS: SBA-CGA with HR  GAIT: Gait pattern:  right knee extensor thrust in stance, shuffling, and trunk flexed, deficits during turning Distance walked:  Assistive device utilized: Environmental consultant - 2 wheeled Level of assistance: Modified independence and SBA Comments: unsteady, deficits during turns  FUNCTIONAL TESTS:  5 times sit to stand: 25 sec Timed up and go (TUG): 20-27 sec Push and release test (# of steps): Anterior: 1-2; Posterior multiple small steps lead to LOB   Berg Balance Test: 43/56    GOALS: Goals reviewed with patient? Yes  SHORT TERM GOALS: Target date: 10/03/2022      Patient will perform advanced HEP with caregiver supervision to address balance, gait, postural deviations, and functional strength deficits Baseline: met initial HEP Goal status:GOAL MET with updated HEP, 10/08/2022   2.  Demonstrate/report independence in household ambulation (no AD) to restore capabilities to PLOF Baseline: modified indep w/ RW household distances Goal status: GOAL NOT MET, 10/08/2022  3.  Demo improved safety with ambulation and reduce risk for falls per TUG test 18 sec w/ or without AD Baseline: 20-27 sec; 10/08/2022 33.59 sec at best with RW Goal status: GOAL NOT MET 10/08/22   LONG TERM GOALS: Target date: 10/31/2022      Demonstrate reduce risk for falls per time 15 sec TUG test with least restrictive AD Baseline: Goal status: IN PROGRESS  2.  Demo/report ability to ambulate uneven surfaces with walking stick  in order to return to walking activity with spouse Baseline:  Goal status: IN PROGRESS  3.  Demonstrate improved balance and low risk for falls per [employer] Test score of 49/56 Baseline: 43/56 Goal status: IN PROGRESS     ASSESSMENT:  CLINICAL IMPRESSION: Focus on strength training with heavy resistance to improve strength, activity tolerance, and recruitment for greater  motor control. Upper body resistance training for postural correction emphasis on compound movements for scapular stabilization/t-spine extension. Gait training with techniques to facilitate large stride with improved carryover on flat ground and through turns vis verbal/visual cues for reinforcement  OBJECTIVE IMPAIRMENTS: Abnormal gait, decreased activity tolerance, decreased balance, decreased coordination, decreased knowledge of use of DME, difficulty walking, decreased ROM, decreased strength, dizziness, impaired flexibility, improper body mechanics, and postural dysfunction.   ACTIVITY LIMITATIONS: carrying, lifting, bending, standing, squatting, stairs, transfers, bed mobility, bathing, dressing, reach over head, and locomotion level  PARTICIPATION LIMITATIONS: meal prep, cleaning, laundry, shopping, community activity, and yard work  PERSONAL FACTORS: Age, Time since onset of injury/illness/exacerbation, and 3+ comorbidities: see medical hx  are also affecting patient's functional outcome.   REHAB POTENTIAL: Good  CLINICAL DECISION MAKING: Evolving/moderate complexity  EVALUATION COMPLEXITY: Moderate  PLAN:  PT FREQUENCY: 2x/week  PT DURATION: other: 7 weeks  recommend additional 15 sessions  PLANNED INTERVENTIONS: Therapeutic exercises, Therapeutic activity, Neuromuscular re-education, Balance training, Gait training, Patient/Family education, Self Care, Joint mobilization, Stair training, Vestibular training, Canalith repositioning, DME instructions, Aquatic Therapy, Dry Needling, Electrical stimulation, Wheelchair mobility training, Spinal mobilization, Cryotherapy, Moist heat, Taping, and Manual therapy  PLAN FOR NEXT SESSION:  Continue balance strategies, but work on wider BOS, staggered stance positions and use of hip strategy, step strategies for balance.  HEP updates for postural re-education and standing weightshifting. Continue to follow up on conversation to patient and  wife about gym equipment that pt can use safely and wife requesting ideas on equipment that may be used at home.  12:01 PM, 10/11/22 M. Sherlyn Lees, PT, DPT Physical Therapist- [place] Office Number: [phone]    [place] [facility] [address], [place], [place] [address] Phone # [phone] Fax # [phone]

## 2022-10-14 DIAGNOSIS — C4441 Basal cell carcinoma of skin of scalp and neck: Secondary | ICD-10-CM | POA: Diagnosis not present

## 2022-10-14 DIAGNOSIS — D492 Neoplasm of unspecified behavior of bone, soft tissue, and skin: Secondary | ICD-10-CM | POA: Diagnosis not present

## 2022-10-14 DIAGNOSIS — D225 Melanocytic nevi of trunk: Secondary | ICD-10-CM | POA: Diagnosis not present

## 2022-10-14 DIAGNOSIS — L821 Other seborrheic keratosis: Secondary | ICD-10-CM | POA: Diagnosis not present

## 2022-10-14 DIAGNOSIS — C4442 Squamous cell carcinoma of skin of scalp and neck: Secondary | ICD-10-CM | POA: Diagnosis not present

## 2022-10-14 DIAGNOSIS — L814 Other melanin hyperpigmentation: Secondary | ICD-10-CM | POA: Diagnosis not present

## 2022-10-15 ENCOUNTER — Ambulatory Visit: Payer: Medicare Other

## 2022-10-15 ENCOUNTER — Ambulatory Visit: Payer: Medicare Other | Admitting: Occupational Therapy

## 2022-10-15 DIAGNOSIS — M6281 Muscle weakness (generalized): Secondary | ICD-10-CM | POA: Diagnosis not present

## 2022-10-15 DIAGNOSIS — R2681 Unsteadiness on feet: Secondary | ICD-10-CM

## 2022-10-15 DIAGNOSIS — R29818 Other symptoms and signs involving the nervous system: Secondary | ICD-10-CM

## 2022-10-15 DIAGNOSIS — R2689 Other abnormalities of gait and mobility: Secondary | ICD-10-CM

## 2022-10-15 DIAGNOSIS — R293 Abnormal posture: Secondary | ICD-10-CM | POA: Diagnosis not present

## 2022-10-15 DIAGNOSIS — R262 Difficulty in walking, not elsewhere classified: Secondary | ICD-10-CM | POA: Diagnosis not present

## 2022-10-15 DIAGNOSIS — R278 Other lack of coordination: Secondary | ICD-10-CM

## 2022-10-15 NOTE — Therapy (Signed)
OUTPATIENT OCCUPATIONAL THERAPY Treatment Session  Patient Name: [name] MRN: [id number] DOB:1935/10/30, 87 y.o., male Today's Date: 10/15/2022  PCP: Donnajean Lopes, MD REFERRING PROVIDER: Star Age, MD      END OF SESSION:  OT End of Session - 10/15/22 1444     Visit Number 23    Number of Visits 25    Date for OT Re-Evaluation 10/25/22    Authorization Type Medicare A &B    OT Start Time [id number]    OT Stop Time [id number]    OT Time Calculation (min) 42 min                                 Past Medical History:  Diagnosis Date   Ankylosing spondylitis ([place]) dx'd ~ 1974   Arthritis    "back" (08/10/2015)   BENIGN PROSTATIC HYPERTROPHY, WITH OBSTRUCTION 01/15/2010   Bleeding duodenal ulcer    Bleeding esophageal ulcer    Bleeding stomach ulcer    GERD 02/22/2008   History of blood transfusion 1988   "lost ~ 1/2 of my blood volume from multiple bleeding ulcers"   History of hiatal hernia    HYPERGLYCEMIA 11/18/2007   HYPERLIPIDEMIA 02/22/2008   HYPERTENSION, UNSPECIFIED 11/10/2009   Kidney stones    Osteoarthritis    c-spine   PEPTIC ULCER DISEASE 11/17/2007   Situational depression    "son died in [place] 06-09-15"   TIA (transient ischemic attack)    "not that I know of in the past; they are trying to determine if I've had one today (08/10/2015)"   Past Surgical History:  Procedure Laterality Date   CATARACT EXTRACTION W/ INTRAOCULAR LENS  IMPLANT, BILATERAL Bilateral 2013   Patient Active Problem List   Diagnosis Date Noted   Ankylosing spondylitis of cervicothoracic region ([place]) 10/16/2016   TIA (transient ischemic attack) 08/10/2015   Carotid stenosis 08/04/2014   Hyperlipidemia 02/21/2012   BENIGN PROSTATIC HYPERTROPHY, WITH OBSTRUCTION 01/15/2010   Essential hypertension 11/10/2009   GERD 02/22/2008   NEPHROLITHIASIS, HX OF 11/17/2007    ONSET DATE: referral date 07/04/22   REFERRING DIAG: G20.C (ICD-10-CM) - Parkinsonism,  unspecified Z91.81 (ICD-10-CM) - History of falling  THERAPY DIAG:  Muscle weakness (generalized)  Unsteadiness on feet  Other lack of coordination  Other symptoms and signs involving the nervous system  Rationale for Evaluation and Treatment: Rehabilitation  SUBJECTIVE:   SUBJECTIVE STATEMENT: Pt reports doing more dressing today.    Pt accompanied by: self and significant other  PERTINENT HISTORY: history of Parkinson's and ankylosing spondylitis   PRECAUTIONS: Fall  WEIGHT BEARING RESTRICTIONS: No  PAIN:  Are you having pain? No  FALLS: Has patient fallen in last 6 months? Yes. Number of falls 6 (3-4 since "big fall" in 06-09-2023)  LIVING ENVIRONMENT: Lives with: lives with their spouse Lives in: House/apartment Stairs: Yes: External: 2-3 steps; has had a ramp built just in last week Internal: steps to basement and steps to loft - however pt does not need to go up/down those at this time. Has following equipment at home: Walker - 2 wheeled, bed side commode, Ramped entry, and transport chair  PLOF: Independent  PATIENT GOALS: to be able to feel confident in any sort of movement to resume prior activities  OBJECTIVE:   HAND DOMINANCE: Right  ADLs: Overall ADLs: Prior to fall in 2022/06/08, pt Independent with all ADLs and IADLs without use of AE/DME, even  completing shower in standing in walk-in shower. Transfers/ambulation related to ADLs: fluctuates with mobility; now using RW for all mobility, Supervision with all mobility Eating: Mod I Grooming: wife will assist with opening items; otherwise setup and supervision due to balance UB Dressing: Min-mod assist, buttons/zippers are quite challenging LB Dressing: Mod assist, is able to don socks/shoes with increased time Toileting: Max assist Bathing: sponge baths since fall, max assist from wife at this time Tub Shower transfers: has walk-in shower, but does not feel safe in shower since fall in Oct Equipment: bed  side commode  IADLs: Meal Prep: since fall in October 2023, pt has been assisting with 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  Pt currently cutting foods for meal prep in sitting position. Medication management: wife assisting Handwriting:  TBA  MOBILITY STATUS: Needs Assist: CGA with mobility with RW and Hx of falls  POSTURE COMMENTS:  rounded shoulders and forward head  FUNCTIONAL OUTCOME MEASURES: Physical performance test: PPT #4 donning/doffing jacket: unable to complete due to LOB* 5 time sit > stand:  1:05.94 with up to mod assist and cues to scoot forward towards edge of chair and needing to push up from RW.  UPPER EXTREMITY ROM:  all movements are slowed, decreased shoulder flexion d/t ankylosing spondylitis  Active ROM Right eval Left eval  Shoulder flexion 90 90  Shoulder abduction    Shoulder adduction    Shoulder extension    Shoulder internal rotation [facility]  Shoulder external rotation [facility]  Elbow flexion [facility] WFL  Elbow extension [facility] WFL  Wrist flexion    Wrist extension    Wrist ulnar deviation    Wrist radial deviation    Wrist pronation    Wrist supination    (Blank rows = not tested)  UPPER EXTREMITY MMT:     MMT Right eval Left eval  Shoulder flexion 4/5 4/5  Shoulder abduction    Shoulder adduction    Shoulder extension    Shoulder internal rotation    Shoulder external rotation    Middle trapezius    Lower trapezius    Elbow flexion 4/5 4/5  Elbow extension 4/5 4/5  Wrist flexion    Wrist extension    Wrist ulnar deviation    Wrist radial deviation    Wrist pronation    Wrist supination    (Blank rows = not tested)  HAND FUNCTION: TBD  COORDINATION: 09/12/22 9 Hole Peg test: Right: 50.47 sec; Left: able to place 7 pegs in 2 mins time limit ** sec Box and Blocks:  Right 41blocks, Left 36 blocks hitting barrier x2 on RUE and x6 on LUE  COGNITION: Overall cognitive status:  Pt's spouse reports that his thinking seems slower, but  attributes it to stress from all issues presenting s/p fall in October  VISION: Subjective report: glasses broke with recent fall Baseline vision: Wears glasses all the time Visual history: cataracts  VISION ASSESSMENT: Not tested  Patient has difficulty with following activities due to following visual impairments: currently difficult to assess as pt does not have glasses with him, as they broke during fall in October 2023.  OBSERVATIONS: Pt with slowed response time and movements during transfers to/from treatment space and during assessment.   TODAY'S TREATMENT:    10/15/22 Dynamic standing: engaged in Connect 4 in standing with pt tolerating standing 10 mins before requiring seated rest break.  Engaged in reaching with R and L UE, crossing midline, and rotating trunk to challenge standing  balance and functional reach.  OT providing mod verbal cues for cognitive challenge with novel game of Connect 4 while standing.   Box and Blocks: R: 31 and L: 33  Completed large amplitude wrist flexion/extension and hand opening prior to completing box and blocks a second time.  Pt then completing L: 34 and R: 47. Large amplitude: OT instructed in wrist flexion/extension (to include placing hands on lap or mat table and rocking back/forth to increase WB and ROM), forearm supination/pronation, elbow extension with hand flicks (providing target to facilitate increased ROM), and finger flicks.  Encouraged increased amplitude with each and educated on use prior to completion of tasks requiring increased ROM and [facility].   10/08/22 Large amplitude: OT instructing pt in forearm supination/pronation and elbow extension with hand flicks with focus on increased amplitude.  OT providing target to facilitate increased ROM and amplitude, especially with forward reaching as pt with progressively smaller movements with repetition. Resistance Clothespins: 2,4,6# with RUE and LUE for mid and high functional reaching and  sustained pinch. Pt req'd Mod cues initially, fading to min cues for movement pattern and maintaining good positioning of LUEwith reach. Completed in standing, while incorporating trunk rotation to cross midline.  Pt able to obtain items from lower surface on R compared to L.   Functional reach: engaged in reaching with BUE to place item on higher and lower surfaces to simulate IADLs while increasing ROM with LUE in conjunction with RUE.  LU [employer] for reaching with LUE and RUE with focus on facilitating increased reaching with LUE.  Pt demonstrating improved upright postural control and functional reach with LUE when placed in front of mirror.      10/02/22 LB dressing: OT reiterating multiple options for LB dressing with use of step stool, modified circle sitting with one leg on EOB, use of reacher, and/or figure 4 position.  Pt continues to demonstrate decreased figure 4 with LLE and decreased sequencing and problem solving with attempts to incorporate AE into task.  Encouraged pt and spouse to attempt various strategies to assess which works best for him. Box and blocks: RUE 37 (no hitting barrier), LUE: 35 (hitting barrier x1).  Pt demonstrating slowing bilaterally, however improved quality of movement with decreased hitting of barrier during task.  Discussed functional implications of improved quality of movement while sacrificing quantity.     PATIENT EDUCATION: Education details: ongoing condition specific education.  Provided handout for large amplitude HEP Person educated: Patient and Spouse Education method: Explanation, Demonstration, Verbal cues, and Handouts Education comprehension: verbalized understanding and needs further education  HOME EXERCISE PROGRAM: Coordination HEP (see pt instructions)  Access Code: [email or web address] URL: [email or web address] Date: 09/25/2022 Prepared by: [facility]  Program Notes Use a target to  facilitate increased reach.  Also if you can relate exercises to functional tasks or even complete functional tasks in similar movement pattern.  Exercises - Open Book Chest Stretch on Towel Roll  - 1 x daily - 3 x weekly - 2 sets - 10 reps - Supine PNF D2  - 2 x daily - 3 x weekly - 3 sets - 10 reps - Shoulder PNF D2 Extension  - 1 x daily - 3 x weekly - 2 sets - 10 reps - Standing Shoulder Internal Rotation AROM Behind Back  - 1 x daily - 3 x weekly - 2 sets - 10 reps - Seated Chest Press  - 1 x daily - 3 x  weekly - 2 sets - 10 reps   GOALS: Goals reviewed with patient? Yes  SHORT TERM GOALS: Target date 10/04/22  Pt will be independent in full body HEP with focus on large amplitude movements as needed to increase independence with ADLs. Baseline: Goal status: Met - 10/02/22   2.  Pt will complete UB dressing (except clothing fasteners) with supervision. Baseline: wife assists with buttons occasionally prior to fall Goal status: Met - 10/02/22   LONG TERM GOALS: Target date: 10/25/22  1. Pt will demonstrate/report shower transfers with DME PRN at supervision level. Baseline: Pt is now bathing at shower level with use of shower chair, still requiring HHA with transfers out of walk-in shower Goal status: IN PROGRESS  2.  Pt will complete UB dressing, to include clothing fasteners, at Mod I level with use of AE and/or alternative strategies PRN. Baseline: requiring assist for shirt around shoulders occasionally and assist with buttons 25- 50% of time Goal status: IN PROGRESS  3.  Pt will complete LB dressing Mod I with use of AE and/or alternative strategies PRN. Baseline:  still having difficulty with pants and tying shoes Goal status: IN PROGRESS  4. Pt will demonstrate improved UE functional use for ADLs as evidenced by increasing box/ blocks score by 4 blocks with LUE. Baseline: Right 41blocks, Left 36 blocks hitting barrier x2 on RUE and x6 on LUE Goal status: IN PROGRESS  5.   Pt will demonstrate improved dynamic standing balance for 15 mins as needed to engage in ADLs/IADLs with increased safety/endurance.   Baseline:  Goal status: IN PROGRESS  6. Pt will demonstrate increased ease with dressing as evidenced by decreasing PPT#4(don/ doff jacket) by 10 secs or more.  Baseline: 57.25 sec on 08/23/22  Goal status: IN PROGRESS  ASSESSMENT:  CLINICAL IMPRESSION: Pt continues to require increased multimodal cues and use of targets and/or functional tasks to facilitate increased ROM and carryover.  Engaged in large amplitude exercises with focus on on ROM and quality of movement, pt demonstrating increased motor control with Box and Blocks after focus on large amplitude movements.  PERFORMANCE DEFICITS: in functional skills including ADLs, IADLs, ROM, strength, pain, flexibility, Fine motor control, Gross motor control, mobility, balance, body mechanics, endurance, decreased knowledge of precautions, decreased knowledge of use of DME, and UE functional use, cognitive skills including attention, memory, safety awareness, sequencing, and thought, and psychosocial skills including environmental adaptation and routines and behaviors.   IMPAIRMENTS: are limiting patient from ADLs and IADLs.   CO-MORBIDITIES: may have co-morbidities  that affects occupational performance. Patient will benefit from skilled OT to address above impairments and improve overall function.  MODIFICATION OR ASSISTANCE TO COMPLETE EVALUATION: Min-Moderate modification of tasks or assist with assess necessary to complete an evaluation.  OT OCCUPATIONAL PROFILE AND HISTORY: Detailed assessment: Review of records and additional review of physical, cognitive, psychosocial history related to current functional performance.  CLINICAL DECISION MAKING: Moderate - several treatment options, min-mod task modification necessary  REHAB POTENTIAL: Good  EVALUATION COMPLEXITY: Moderate    PLAN:  OT  FREQUENCY: 2x/week  OT DURATION: 8 weeks  PLANNED INTERVENTIONS: self care/ADL training, therapeutic exercise, therapeutic activity, neuromuscular re-education, manual therapy, balance training, functional mobility training, ultrasound, biofeedback, compression bandaging, moist heat, cryotherapy, patient/family education, cognitive remediation/compensation, visual/perceptual remediation/compensation, psychosocial skills training, energy conservation, coping strategies training, and DME and/or AE instructions  RECOMMENDED OTHER SERVICES: N/A  CONSULTED AND AGREED WITH PLAN OF CARE: Patient and family member/caregiver  PLAN FOR NEXT SESSION:  BUE ROM for dressing with focus on large amplitude and posture (clothing fasteners and donning/doffing jacket); Modifications of positioning/alternative strategies for LB dressing, Incorporate coordination activities and large amplitude movements.    Simonne Come, OTR/L 10/15/2022, 3:03 PM

## 2022-10-15 NOTE — Patient Instructions (Signed)
PWR! Hands  Then, start with elbows bent and hands closed, perform the following: PWR! Up: Close hands and flick fingers open and apart BIG PWR! Rock: Move wrists up and down BIG  (can also attempt by placing hands on chair or lap and rocking forward to increase weight through hands/bend of wrist) PWR! Twist: Twist palms/forearms up and down BIG PWR! Step: Touch index finger to thumb while keeping other fingers straight. Flick fingers out BIG (thumb out/straighten fingers). Repeat with other fingers. (Step your thumb to each finger). PWR! Hands: Push hands out BIG. Elbows straight, wrists up, fingers open and spread apart BIG.   ** Make each movement big and deliberate so that you feel the movement.  Perform at least 10 repetitions 1x/day, but perform PWR! hands throughout the day when you are having trouble using your hands (picking up/manipulating small objects, writing, eating, typing, sewing, buttoning, etc.).

## 2022-10-15 NOTE — Therapy (Signed)
OUTPATIENT PHYSICAL THERAPY NEURO TREATMENT NOTE   Patient Name: [name] MRN: [id number] DOB:09/11/1935, 87 y.o., male Today's Date: 10/15/2022   PCP: Donnajean Lopes, MD REFERRING PROVIDER: Star Age, MD  END OF SESSION:  PT End of Session - 10/15/22 1257     Visit Number 24    Number of Visits 30    Date for PT Re-Evaluation 10/31/22    Authorization Type Medicare    Progress Note Due on Visit 25    PT Start Time 1315    PT Stop Time 1400    PT Time Calculation (min) 45 min    Equipment Utilized During Treatment Gait belt    Activity Tolerance Patient tolerated treatment well    Behavior During Therapy WFL for tasks assessed/performed                    Past Medical History:  Diagnosis Date   Ankylosing spondylitis ([place]) dx'd ~ 1974   Arthritis    "back" (08/10/2015)   BENIGN PROSTATIC HYPERTROPHY, WITH OBSTRUCTION 01/15/2010   Bleeding duodenal ulcer    Bleeding esophageal ulcer    Bleeding stomach ulcer    GERD 02/22/2008   History of blood transfusion 1988   "lost ~ 1/2 of my blood volume from multiple bleeding ulcers"   History of hiatal hernia    HYPERGLYCEMIA 11/18/2007   HYPERLIPIDEMIA 02/22/2008   HYPERTENSION, UNSPECIFIED 11/10/2009   Kidney stones    Osteoarthritis    c-spine   PEPTIC ULCER DISEASE 11/17/2007   Situational depression    "son died in [place] 07/02/15"   TIA (transient ischemic attack)    "not that I know of in the past; they are trying to determine if I've had one today (08/10/2015)"   Past Surgical History:  Procedure Laterality Date   CATARACT EXTRACTION W/ INTRAOCULAR LENS  IMPLANT, BILATERAL Bilateral 2013   Patient Active Problem List   Diagnosis Date Noted   Ankylosing spondylitis of cervicothoracic region ([place]) 10/16/2016   TIA (transient ischemic attack) 08/10/2015   Carotid stenosis 08/04/2014   Hyperlipidemia 02/21/2012   BENIGN PROSTATIC HYPERTROPHY, WITH OBSTRUCTION 01/15/2010   Essential hypertension  11/10/2009   GERD 02/22/2008   NEPHROLITHIASIS, HX OF 11/17/2007    ONSET DATE: 2022-07-01, PD for 4 years  REFERRING DIAG: Z91.81 (ICD-10-CM) - History of recent fall G20.C (ICD-10-CM) - Primary parkinsonism  THERAPY DIAG:  Muscle weakness (generalized)  Unsteadiness on feet  Abnormal posture  Other abnormalities of gait and mobility  Difficulty in walking, not elsewhere classified  Rationale for Evaluation and Treatment: Rehabilitation  SUBJECTIVE:  SUBJECTIVE STATEMENT: Feeling pretty good  Pt accompanied by: significant other  PERTINENT HISTORY: PD, complex medical history of peptic ulcer disease with history of upper GI bleed, TIA, depression, degenerative disc disease in the neck, osteoarthritis, kidney stones, hyperlipidemia, hypertension, history of hiatal hernia, BPH, arthritis, and ankylosing spondylitis, and parkinsonism  PAIN:  Are you having pain? No  Slight residual pain in R ribs  PRECAUTIONS: None  WEIGHT BEARING RESTRICTIONS: No  FALLS: Has patient fallen in last 6 months? Yes. Number of falls "several"  LIVING ENVIRONMENT: Lives with: lives with their spouse Lives in: House/apartment Stairs:  stairs inside home to loft, ramp to enter/exit home Has following equipment at home: Gilford Rile - 2 wheeled, since the fall in October  PLOF: [place]: return to PLOF  OBJECTIVE:   TODAY'S TREATMENT: 10/15/22 Activity Comments  Turning/pivoting activity 2x10 180 deg turns transferring cones from counter<>EOM--god carryover between sets  Reactive balance -roll/kick physioball 2x10 for SLS and reactive balance -squat-pickup and bounce physioball 2x10--wide BOS -postural perturbations: spouse throws physioball against pt back for reactive ankle strategy   Gait training Training in large step/stride  Strength training Standing hip abd 2x10 w/ BUE support Seated unilat row 1x10: 20,25,30 lbs Wide grip row 1x10: 30,35,40 lbs LAQ 3x10 10# Seated march 3x10 10#            PATIENT EDUCATION: Education details: Stance positions and step strategies to reduce posterior LOB  Person educated: Patient and Spouse Education method: Explanation, Demonstration, Tactile cues, Verbal cues, and Handouts Education comprehension: verbalized understanding and returned demonstration   HOME EXERCISE PROGRAM Last updated: 10/02/21 Access Code: [email or web address] URL: [email or web address] Date: 10/02/2022 Prepared by: [facility]  Program Notes perform with gait belt and wife's supervision  Exercises - Standing Balance in Corner  - 1 x daily - 7 x weekly - 3 sets - 30 sec hold - Narrow Stance with Counter Support  - 1 x daily - 5 x weekly - 2 sets - 10 reps - 30 sec hold - Standing Balance in Corner with Eyes Closed  - 1 x daily - 5 x weekly - 2 sets - 10 reps - 30 sec hold - Sit to Stand with Counter Support  - 1 x daily - 5 x weekly - 2 sets - 5-10 reps - Supine Lower Trunk Rotation  - 1 x daily - 7 x weekly - 3 sets - 10 reps - Supine Bridge  - 1 x daily - 7 x weekly - 3 sets - 10 reps - Thoracic Extension Mobilization on Foam Roll  - 1 x daily - 7 x weekly - 3 sets - 10 reps - 3 sec hold - Seated Shoulder Horizontal Abduction with Resistance  - 1 x daily - 7 x weekly - 3 sets - 10 reps - Forward Backward Weight Shift with Counter Support  - 1 x daily - 5 x weekly - 2 sets - 10 reps    Below measures were taken at time of initial evaluation unless otherwise specified:   DIAGNOSTIC FINDINGS: no intracranial or osseous injuries  COGNITION: Overall cognitive status: Within functional limits for tasks assessed   SENSATION: WFL  COORDINATION: Alternating movements impaired, right ankle limitations  pre-morbid from hx of crush injury in childhood Heel to shin mildly impaired right > left Finger to nose WNL Finger opposition: unable to perform bilaterally    MUSCLE TONE: NT  MUSCLE LENGTH: Presents with bilateral knee  flexion contractures, approx 5-10 degrees  DTRs:    POSTURE: rounded shoulders, forward head, and increased thoracic kyphosis  LOWER EXTREMITY ROM:     Active  Right Eval Left Eval  Hip flexion    Hip extension    Hip abduction    Hip adduction    Hip internal rotation    Hip external rotation    Knee flexion    Knee extension 0 0  Ankle dorsiflexion 5 10  Ankle plantarflexion    Ankle inversion    Ankle eversion     (Blank rows = not tested)  LOWER EXTREMITY MMT:  assessed in sitting  MMT Right Eval Left Eval  Hip flexion 4 4  Hip extension    Hip abduction 3+ 3+  Hip adduction 4 4  Hip internal rotation    Hip external rotation    Knee flexion 4 4  Knee extension 4 4  Ankle dorsiflexion 3 3+  Ankle plantarflexion    Ankle inversion    Ankle eversion    (Blank rows = not tested)  BED MOBILITY:  Modified Indep  TRANSFERS: Modified indep to set-up level  Floor:  DNT  RAMP:  NT  CURB:  Level of Assistance: CGA-SBA Assistive device utilized: Environmental consultant - 2 wheeled Curb Comments:   STAIRS: SBA-CGA with HR  GAIT: Gait pattern:  right knee extensor thrust in stance, shuffling, and trunk flexed, deficits during turning Distance walked:  Assistive device utilized: Environmental consultant - 2 wheeled Level of assistance: Modified independence and SBA Comments: unsteady, deficits during turns  FUNCTIONAL TESTS:  5 times sit to stand: 25 sec Timed up and go (TUG): 20-27 sec Push and release test (# of steps): Anterior: 1-2; Posterior multiple small steps lead to LOB   Berg Balance Test: 43/56    GOALS: Goals reviewed with patient? Yes  SHORT TERM GOALS: Target date: 10/03/2022      Patient will perform advanced HEP with caregiver  supervision to address balance, gait, postural deviations, and functional strength deficits Baseline: met initial HEP Goal status:GOAL MET with updated HEP, 10/08/2022   2.  Demonstrate/report independence in household ambulation (no AD) to restore capabilities to PLOF Baseline: modified indep w/ RW household distances Goal status: GOAL NOT MET, 10/08/2022  3.  Demo improved safety with ambulation and reduce risk for falls per TUG test 18 sec w/ or without AD Baseline: 20-27 sec; 10/08/2022 33.59 sec at best with RW Goal status: GOAL NOT MET 10/08/22   LONG TERM GOALS: Target date: 10/31/2022      Demonstrate reduce risk for falls per time 15 sec TUG test with least restrictive AD Baseline: Goal status: IN PROGRESS  2.  Demo/report ability to ambulate uneven surfaces with walking stick in order to return to walking activity with spouse Baseline:  Goal status: IN PROGRESS  3.  Demonstrate improved balance and low risk for falls per [employer] Test score of 49/56 Baseline: 43/56 Goal status: IN PROGRESS     ASSESSMENT:  CLINICAL IMPRESSION: Continued with POC details/training for improved reactive balance and functional righting reaction strategies with emphasis on enhancing large amplitude movements and decisive movement patterns for coordination and rapid alternating of directions.  Able to progress upper body strength activities for emphasis on strong shoulder girdle and arm strength for puling movements. Continued sessions to refine home/community based program  OBJECTIVE IMPAIRMENTS: Abnormal gait, decreased activity tolerance, decreased balance, decreased coordination, decreased knowledge of use of DME, difficulty walking, decreased ROM, decreased strength, dizziness,  impaired flexibility, improper body mechanics, and postural dysfunction.   ACTIVITY LIMITATIONS: carrying, lifting, bending, standing, squatting, stairs, transfers, bed mobility, bathing, dressing, reach over head,  and locomotion level  PARTICIPATION LIMITATIONS: meal prep, cleaning, laundry, shopping, community activity, and yard work  PERSONAL FACTORS: Age, Time since onset of injury/illness/exacerbation, and 3+ comorbidities: see medical hx  are also affecting patient's functional outcome.   REHAB POTENTIAL: Good  CLINICAL DECISION MAKING: Evolving/moderate complexity  EVALUATION COMPLEXITY: Moderate  PLAN:  PT FREQUENCY: 2x/week  PT DURATION: other: 7 weeks  recommend additional 15 sessions  PLANNED INTERVENTIONS: Therapeutic exercises, Therapeutic activity, Neuromuscular re-education, Balance training, Gait training, Patient/Family education, Self Care, Joint mobilization, Stair training, Vestibular training, Canalith repositioning, DME instructions, Aquatic Therapy, Dry Needling, Electrical stimulation, Wheelchair mobility training, Spinal mobilization, Cryotherapy, Moist heat, Taping, and Manual therapy  PLAN FOR NEXT SESSION:  Continue balance strategies, but work on wider BOS, staggered stance positions and use of hip strategy, step strategies for balance.  HEP updates for postural re-education and standing weightshifting. Continue to follow up on conversation to patient and wife about gym equipment that pt can use safely and wife requesting ideas on equipment that may be used at home.  12:58 PM, 10/15/22 M. Sherlyn Lees, PT, DPT Physical Therapist- [place] Office Number: [phone]    [facility] at [facility] [address] [place], [place] [address] Phone # [phone] Fax # [phone]

## 2022-10-16 NOTE — Therapy (Unsigned)
OUTPATIENT SPEECH LANGUAGE PATHOLOGY PARKINSON'S EVALUATION   Patient Name: [name] MRN: [id number] DOB:15-Nov-1935, 87 y.o., male Today's Date: 10/17/2022  PCP: Donnajean Lopes MD REFERRING PROVIDER: Star Age, MD  END OF SESSION:  End of Session - 10/17/22 1351     Visit Number 1    Number of Visits 25    Date for SLP Re-Evaluation 01/09/23    Authorization Type Medicare A & B    SLP Start Time 1400    SLP Stop Time  [id number]    SLP Time Calculation (min) 45 min    Activity Tolerance Patient tolerated treatment well             Past Medical History:  Diagnosis Date   Ankylosing spondylitis ([place]) dx'd ~ 1974   Arthritis    "back" (08/10/2015)   BENIGN PROSTATIC HYPERTROPHY, WITH OBSTRUCTION 01/15/2010   Bleeding duodenal ulcer    Bleeding esophageal ulcer    Bleeding stomach ulcer    GERD 02/22/2008   History of blood transfusion 1988   "lost ~ 1/2 of my blood volume from multiple bleeding ulcers"   History of hiatal hernia    HYPERGLYCEMIA 11/18/2007   HYPERLIPIDEMIA 02/22/2008   HYPERTENSION, UNSPECIFIED 11/10/2009   Kidney stones    Osteoarthritis    c-spine   PEPTIC ULCER DISEASE 11/17/2007   Situational depression    "son died in [place] 18-Jun-2015"   TIA (transient ischemic attack)    "not that I know of in the past; they are trying to determine if I've had one today (08/10/2015)"   Past Surgical History:  Procedure Laterality Date   CATARACT EXTRACTION W/ INTRAOCULAR LENS  IMPLANT, BILATERAL Bilateral 2013   Patient Active Problem List   Diagnosis Date Noted   Ankylosing spondylitis of cervicothoracic region ([place]) 10/16/2016   TIA (transient ischemic attack) 08/10/2015   Carotid stenosis 08/04/2014   Hyperlipidemia 02/21/2012   BENIGN PROSTATIC HYPERTROPHY, WITH OBSTRUCTION 01/15/2010   Essential hypertension 11/10/2009   GERD 02/22/2008   NEPHROLITHIASIS, HX OF 11/17/2007    ONSET DATE: 10/02/22 (referral date); PD dx 4 years ago   REFERRING  DIAG:  R49.8 (ICD-10-CM) - Hypophonia  G20.A2 (ICD-10-CM) - Parkinson's disease with fluctuating manifestations, unspecified whether dyskinesia present   THERAPY DIAG: Dysarthria and anarthria  Cognitive communication deficit  Rationale for Evaluation and Treatment: Rehabilitation  SUBJECTIVE:   SUBJECTIVE STATEMENT: "He speaks very softly"  Pt accompanied by: significant other  PERTINENT HISTORY: PD, complex medical history of peptic ulcer disease with history of upper GI bleed, TIA, depression, degenerative disc disease in the neck, osteoarthritis, kidney stones, hyperlipidemia, hypertension, history of hiatal hernia, BPH, arthritis, and ankylosing spondylitis, and parkinsonism. Referred by [facility] OP team for hypophonia. No prior ST for PD reported.   PAIN: Are you having pain? No  FALLS: Has patient fallen in last 6 months?  Yes, Comment: 2 recent falls; working with PT  LIVING ENVIRONMENT: Lives with: lives with their family Lives in: House/apartment  PLOF:  Level of assistance: Independent with ADLs, Independent with IADLs Employment: Retired - history teacher  PATIENT GOALS: improve loudness   OBJECTIVE:   COGNITION: Overall cognitive status: Impaired - some mild changes reported  Areas of impairment: Memory Comments: Pt endorsed some intermittent difficulty recalling medications and appointments, which wife correlated to stress and complex medication scheduling. Cognition was not primary concern at this time so SLP will continue to monitor and address as needed. Add goals PRN.  MOTOR SPEECH: Overall motor speech:  impaired Level of impairment: Conversation Respiration: thoracic breathing and clavicular breathing Phonation: low vocal intensity Resonance: WFL Articulation: Appears intact Intelligibility: Intelligible Interfering components:  PD Effective technique: increased vocal intensity  ORAL MOTOR EXAMINATION: Overall status: WFL  OBJECTIVE VOICE  ASSESSMENT: Sustained "ah" maximum phonation time: 15 seconds Sustained "ah" loudness average: 65 dB Oral reading (passage) loudness average: 62 dB Oral reading loudness range: 62-68 dB Conversational loudness average: 65 dB Conversational loudness range: mid fading to low 60s dB Voice quality: low vocal intensity Stimulability trials: Given SLP modeling and usual mod cues, loudness average for loud /a/ increased to upper 70s dB and upper 60s dB for simple sentences. Comments: Pt was responsive to SLP cues and instruction to optimize vocal intensity to WNL for short utterances. Wife reported noticeable improvement.   Completed audio recording of patients baseline voice without cueing from SLP: No  Pt does not report difficulty with swallowing with solids or liquids, which does not warrant further evaluation. Rare throat clearing noted today throughout evaluation. Some minimal drooling exhibited intermittently in session. Reported increased drooling at night and occasionally throughout day.   PATIENT REPORTED OUTCOME MEASURES (PROM): Not completed today - administer in first 1-2 sessions   TODAY'S TREATMENT:                                                                                                                                         10/16/22: Educated patient on possible impact of Parkinson's Disease (PD) on speech, swallowing, and cognition. Denied changes in swallowing and minimal changes in cognition (not primary concern at this time). Reported decline in conversational volume resulting in wife asking for repetition and clarification. Educated patient on ways to optimize vocal intensity, including utilizing principle of intent and using louder speaking volume. Pt was responsive to SLP education and instruction with pt able to optimize volume with cues and modeling for short utterances. Also recommended using intentional swallows to manage saliva more effectively as occasional minimal  drooling noted. HEP initiated and provided today. Pt and wife verbalized understanding and agreement with SLP recs.   PATIENT EDUCATION: Education details: see above Person educated: Patient and Spouse Education method: Explanation, Demonstration, and Handouts Education comprehension: verbalized understanding, returned demonstration, and needs further education  HOME EXERCISE PROGRAM: See Pt instructions    GOALS: Goals reviewed with patient? Yes  SHORT TERM GOALS: Target date: 11/14/2022  Pt will produce loud /a/ with at least low 80s dB average over 2 sessions Baseline: Goal status: INITIAL  2.  Pt will demo low 70s dB average in 8/10 sentence responses over 2 sessions Baseline:  Goal status: INITIAL  3.  Pt will demo low 70s dB average in 3-5 minutes simple conversation given occasional min A over 2 sessions  Baseline:  Goal status: INITIAL  4.  Pt will generate abdominal breathing 80% of the time when engaging in 5 minutes simple  conversation over 2 sessions Baseline:  Goal status: INITIAL  5.  Pt will effectively manage saliva with volitional swallows for 3 or less episodes given occasional min A over 2 sessions Baseline:  Goal status: INITIAL   LONG TERM GOALS: Target date: 01/09/23   Pt will complete homework (HEP) for at least 7 consecutive days Baseline:  Goal status: INITIAL  2.  Pt will demo WNL volume (low 70s dB average) in 7-10 minutes simple to mod complex conversation with occasional min A over 2 sessions Baseline:  Goal status: INITIAL  3.  Pt will use abdominal breathing 75% of the time in 10 minutes simple-mod complex conversation over 2 sessions  Baseline:  Goal status: INITIAL  4.  Pt will employ memory compensations (as needed) to effectively manage daily tasks given less frequent A from wife by last ST session Baseline:  Goal status: INITIAL  ASSESSMENT:  CLINICAL IMPRESSION: Patient is a 87 y.o. M who was referred for hypophonia  secondary to Parkinson's Disease. Conversational volume averaged mid 60s dB in quiet environment (WNL=70-72 dB). Wife reported patient now speaks more softly and often requests pt repeat in order to understand him better. Pt was responsive to SLP modeling and cues to increase volume to upper 60s dB for short structured phrases. Denied overt changes in swallowing or cognition (some minimal changes - not primary concern at this time). Some rare throat clearing and drooling exhibited today. Pt would benefit from skilled ST intervention to optimize communication effectiveness, management of saliva, and possibly cognitive compensations to aid daily functioning.   OBJECTIVE IMPAIRMENTS: Objective impairments include memory, dysarthria, and Sialorrhea . These impairments are limiting patient from household responsibilities and effectively communicating at home and in community.Factors affecting potential to achieve goals and functional outcome are medical prognosis.. Patient will benefit from skilled SLP services to address above impairments and improve overall function.  REHAB POTENTIAL: Good  PLAN:  SLP FREQUENCY: 2x/week  SLP DURATION: 12 weeks  PLANNED INTERVENTIONS: Cueing hierachy, Cognitive reorganization, Internal/external aids, Functional tasks, Multimodal communication approach, SLP instruction and feedback, Compensatory strategies, and Patient/family education    Marzetta Board, CCC-SLP 10/17/2022, 4:10 PM

## 2022-10-17 ENCOUNTER — Ambulatory Visit: Payer: Medicare Other

## 2022-10-17 DIAGNOSIS — R293 Abnormal posture: Secondary | ICD-10-CM | POA: Diagnosis not present

## 2022-10-17 DIAGNOSIS — R262 Difficulty in walking, not elsewhere classified: Secondary | ICD-10-CM

## 2022-10-17 DIAGNOSIS — G20A2 Parkinson's disease without dyskinesia, with fluctuations: Secondary | ICD-10-CM

## 2022-10-17 DIAGNOSIS — R2689 Other abnormalities of gait and mobility: Secondary | ICD-10-CM

## 2022-10-17 DIAGNOSIS — M6281 Muscle weakness (generalized): Secondary | ICD-10-CM | POA: Diagnosis not present

## 2022-10-17 DIAGNOSIS — R471 Dysarthria and anarthria: Secondary | ICD-10-CM

## 2022-10-17 DIAGNOSIS — R29818 Other symptoms and signs involving the nervous system: Secondary | ICD-10-CM | POA: Diagnosis not present

## 2022-10-17 DIAGNOSIS — R2681 Unsteadiness on feet: Secondary | ICD-10-CM

## 2022-10-17 DIAGNOSIS — R41841 Cognitive communication deficit: Secondary | ICD-10-CM

## 2022-10-17 NOTE — Patient Instructions (Signed)
Loud "ahh" x5 (twice a day) Take a deep breath and be as loud as you can Stop if you are straining your voice   Read 10 sentences in a loud, strong voice

## 2022-10-17 NOTE — Therapy (Signed)
OUTPATIENT PHYSICAL THERAPY NEURO TREATMENT NOTE and Progress Note   Patient Name: [name] MRN: [id number] DOB:June 22, 1936, 87 y.o., male Today's Date: 10/17/2022   PCP: Donnajean Lopes, MD REFERRING PROVIDER: Star Age, MD  Progress Note Reporting Period 09/17/22 to 10/17/22  See note below for Objective Data and Assessment of Progress/Goals.     END OF SESSION:  PT End of Session - 10/17/22 1452     Visit Number 25    Number of Visits 30    Date for PT Re-Evaluation 10/31/22    Authorization Type Medicare    Progress Note Due on Visit 45    PT Start Time 1445    PT Stop Time 1530    PT Time Calculation (min) 45 min    Equipment Utilized During Treatment Gait belt    Activity Tolerance Patient tolerated treatment well    Behavior During Therapy WFL for tasks assessed/performed                    Past Medical History:  Diagnosis Date   Ankylosing spondylitis ([place]) dx'd ~ 1974   Arthritis    "back" (08/10/2015)   BENIGN PROSTATIC HYPERTROPHY, WITH OBSTRUCTION 01/15/2010   Bleeding duodenal ulcer    Bleeding esophageal ulcer    Bleeding stomach ulcer    GERD 02/22/2008   History of blood transfusion 1988   "lost ~ 1/2 of my blood volume from multiple bleeding ulcers"   History of hiatal hernia    HYPERGLYCEMIA 11/18/2007   HYPERLIPIDEMIA 02/22/2008   HYPERTENSION, UNSPECIFIED 11/10/2009   Kidney stones    Osteoarthritis    c-spine   PEPTIC ULCER DISEASE 11/17/2007   Situational depression    "son died in [place] 2015/07/01"   TIA (transient ischemic attack)    "not that I know of in the past; they are trying to determine if I've had one today (08/10/2015)"   Past Surgical History:  Procedure Laterality Date   CATARACT EXTRACTION W/ INTRAOCULAR LENS  IMPLANT, BILATERAL Bilateral 2013   Patient Active Problem List   Diagnosis Date Noted   Ankylosing spondylitis of cervicothoracic region ([place]) 10/16/2016   TIA (transient ischemic attack) 08/10/2015    Carotid stenosis 08/04/2014   Hyperlipidemia 02/21/2012   BENIGN PROSTATIC HYPERTROPHY, WITH OBSTRUCTION 01/15/2010   Essential hypertension 11/10/2009   GERD 02/22/2008   NEPHROLITHIASIS, HX OF 11/17/2007    ONSET DATE: 30-Jun-2022, PD for 4 years  REFERRING DIAG: Z91.81 (ICD-10-CM) - History of recent fall G20.C (ICD-10-CM) - Primary parkinsonism  THERAPY DIAG:  Muscle weakness (generalized)  Unsteadiness on feet  Abnormal posture  Other abnormalities of gait and mobility  Difficulty in walking, not elsewhere classified  Parkinson's disease with fluctuating manifestations, unspecified whether dyskinesia present  Rationale for Evaluation and Treatment: Rehabilitation  SUBJECTIVE:  SUBJECTIVE STATEMENT: Had two backwards falls yesterday, no injuries noted  Pt accompanied by: significant other  PERTINENT HISTORY: PD, complex medical history of peptic ulcer disease with history of upper GI bleed, TIA, depression, degenerative disc disease in the neck, osteoarthritis, kidney stones, hyperlipidemia, hypertension, history of hiatal hernia, BPH, arthritis, and ankylosing spondylitis, and parkinsonism  PAIN:  Are you having pain? No  Slight residual pain in R ribs  PRECAUTIONS: None  WEIGHT BEARING RESTRICTIONS: No  FALLS: Has patient fallen in last 6 months? Yes. Number of falls "several"  LIVING ENVIRONMENT: Lives with: lives with their spouse Lives in: House/apartment Stairs:  stairs inside home to loft, ramp to enter/exit home Has following equipment at home: Gilford Rile - 2 wheeled, since the fall in October  PLOF: [place]: return to PLOF  OBJECTIVE:   TODAY'S TREATMENT: 10/17/22 Activity Comments  TUG test Trial 1: 32 sec w/ RW Trial 2: 17 sec w/out AD   Berg Balance Test  42/56  Walking outdoors CGA-min A w/ trekking poles over [employer], embankments, and Research scientist (medical) w/ rollator CGA-min A in safe use and brake function           TODAY'S TREATMENT: 10/15/22 Activity Comments  Turning/pivoting activity 2x10 180 deg turns transferring cones from counter<>EOM--god carryover between sets  Reactive balance -roll/kick physioball 2x10 for SLS and reactive balance -squat-pickup and bounce physioball 2x10--wide BOS -postural perturbations: spouse throws physioball against pt back for reactive ankle strategy  Gait training Training in large step/stride  Strength training Standing hip abd 2x10 w/ BUE support Seated unilat row 1x10: 20,25,30 lbs Wide grip row 1x10: 30,35,40 lbs LAQ 3x10 10# Seated march 3x10 10#            PATIENT EDUCATION: Education details: Stance positions and step strategies to reduce posterior LOB  Person educated: Patient and Spouse Education method: Explanation, Demonstration, Tactile cues, Verbal cues, and Handouts Education comprehension: verbalized understanding and returned demonstration   HOME EXERCISE PROGRAM Last updated: 10/02/21 Access Code: [email or web address] URL: [email or web address] Date: 10/02/2022 Prepared by: [facility]  Program Notes perform with gait belt and wife's supervision  Exercises - Standing Balance in Corner  - 1 x daily - 7 x weekly - 3 sets - 30 sec hold - Narrow Stance with Counter Support  - 1 x daily - 5 x weekly - 2 sets - 10 reps - 30 sec hold - Standing Balance in Corner with Eyes Closed  - 1 x daily - 5 x weekly - 2 sets - 10 reps - 30 sec hold - Sit to Stand with Counter Support  - 1 x daily - 5 x weekly - 2 sets - 5-10 reps - Supine Lower Trunk Rotation  - 1 x daily - 7 x weekly - 3 sets - 10 reps - Supine Bridge  - 1 x daily - 7 x weekly - 3 sets - 10 reps - Thoracic Extension Mobilization on Foam  Roll  - 1 x daily - 7 x weekly - 3 sets - 10 reps - 3 sec hold - Seated Shoulder Horizontal Abduction with Resistance  - 1 x daily - 7 x weekly - 3 sets - 10 reps - Forward Backward Weight Shift with Counter Support  - 1 x daily - 5 x weekly - 2 sets - 10 reps    Below measures were taken at time of initial evaluation unless otherwise specified:  DIAGNOSTIC FINDINGS: no intracranial or osseous injuries  COGNITION: Overall cognitive status: Within functional limits for tasks assessed   SENSATION: WFL  COORDINATION: Alternating movements impaired, right ankle limitations pre-morbid from hx of crush injury in childhood Heel to shin mildly impaired right > left Finger to nose WNL Finger opposition: unable to perform bilaterally    MUSCLE TONE: NT  MUSCLE LENGTH: Presents with bilateral knee flexion contractures, approx 5-10 degrees  DTRs:    POSTURE: rounded shoulders, forward head, and increased thoracic kyphosis  LOWER EXTREMITY ROM:     Active  Right Eval Left Eval  Hip flexion    Hip extension    Hip abduction    Hip adduction    Hip internal rotation    Hip external rotation    Knee flexion    Knee extension 0 0  Ankle dorsiflexion 5 10  Ankle plantarflexion    Ankle inversion    Ankle eversion     (Blank rows = not tested)  LOWER EXTREMITY MMT:  assessed in sitting  MMT Right Eval Left Eval  Hip flexion 4 4  Hip extension    Hip abduction 3+ 3+  Hip adduction 4 4  Hip internal rotation    Hip external rotation    Knee flexion 4 4  Knee extension 4 4  Ankle dorsiflexion 3 3+  Ankle plantarflexion    Ankle inversion    Ankle eversion    (Blank rows = not tested)  BED MOBILITY:  Modified Indep  TRANSFERS: Modified indep to set-up level  Floor:  DNT  RAMP:  NT  CURB:  Level of Assistance: CGA-SBA Assistive device utilized: Environmental consultant - 2 wheeled Curb Comments:   STAIRS: SBA-CGA with HR  GAIT: Gait pattern:  right knee extensor  thrust in stance, shuffling, and trunk flexed, deficits during turning Distance walked:  Assistive device utilized: Environmental consultant - 2 wheeled Level of assistance: Modified independence and SBA Comments: unsteady, deficits during turns  FUNCTIONAL TESTS:  5 times sit to stand: 25 sec Timed up and go (TUG): 20-27 sec Push and release test (# of steps): Anterior: 1-2; Posterior multiple small steps lead to LOB   Berg Balance Test: 43/56    GOALS: Goals reviewed with patient? Yes  SHORT TERM GOALS: Target date: 10/03/2022      Patient will perform advanced HEP with caregiver supervision to address balance, gait, postural deviations, and functional strength deficits Baseline: met initial HEP Goal status:GOAL MET with updated HEP, 10/08/2022   2.  Demonstrate/report independence in household ambulation (no AD) to restore capabilities to PLOF Baseline: modified indep w/ RW household distances Goal status: GOAL NOT MET, 10/08/2022  3.  Demo improved safety with ambulation and reduce risk for falls per TUG test 18 sec w/ or without AD Baseline: 20-27 sec; 10/08/2022 33.59 sec at best with RW Goal status: GOAL NOT MET 10/08/22   LONG TERM GOALS: Target date: 10/31/2022      Demonstrate reduce risk for falls per time 15 sec TUG test with least restrictive AD Baseline: (10/17/22) 32 sec w/ RW, 17 sec without Goal status: IN PROGRESS  2.  Demo/report ability to ambulate uneven surfaces with walking stick in order to return to walking activity with spouse Baseline:  Goal status: IN PROGRESS  3.  Demonstrate improved balance and low risk for falls per [facility] Test score of 49/56 Baseline: 43/56; (10/17/22) 42/56 Goal status: IN PROGRESS     ASSESSMENT:  CLINICAL IMPRESSION: TUG test reveals 32  sec time with RW and 17 sec time without AD due to ability to make a sharper 180 degree turn and improved stride length. Continued with gait training outdoors using trekking poles to enable walking  throughout grass and uneven surface with good ability to maintain step height and clearance but notable difficulty in negotiating open space needing CGA-min A to maintain safety. Use of rollator to facilitate carryover to walking program with spouse to enable continuous steps and increased stride in preparation for community walking program. Minimal change in Ray Balance Test score. Continued sessions to refine community-based activities, e.g. gait train with spouse using rollator vs trekking poles/walking stick  OBJECTIVE IMPAIRMENTS: Abnormal gait, decreased activity tolerance, decreased balance, decreased coordination, decreased knowledge of use of DME, difficulty walking, decreased ROM, decreased strength, dizziness, impaired flexibility, improper body mechanics, and postural dysfunction.   ACTIVITY LIMITATIONS: carrying, lifting, bending, standing, squatting, stairs, transfers, bed mobility, bathing, dressing, reach over head, and locomotion level  PARTICIPATION LIMITATIONS: meal prep, cleaning, laundry, shopping, community activity, and yard work  PERSONAL FACTORS: Age, Time since onset of injury/illness/exacerbation, and 3+ comorbidities: see medical hx  are also affecting patient's functional outcome.   REHAB POTENTIAL: Good  CLINICAL DECISION MAKING: Evolving/moderate complexity  EVALUATION COMPLEXITY: Moderate  PLAN:  PT FREQUENCY: 2x/week  PT DURATION: other: 7 weeks  recommend additional 15 sessions  PLANNED INTERVENTIONS: Therapeutic exercises, Therapeutic activity, Neuromuscular re-education, Balance training, Gait training, Patient/Family education, Self Care, Joint mobilization, Stair training, Vestibular training, Canalith repositioning, DME instructions, Aquatic Therapy, Dry Needling, Electrical stimulation, Wheelchair mobility training, Spinal mobilization, Cryotherapy, Moist heat, Taping, and Manual therapy  PLAN FOR NEXT SESSION:  Gait train w/ pt and spouse outside to  facilitate paved walking trail program using rollator and/or trekking poles  4:26 PM, 10/17/22 M. Sherlyn Lees, PT, DPT Physical Therapist- [place] Office Number: [phone]    [place] at [facility] [address] [place], [place] [address] Phone # [phone] Fax # [phone]

## 2022-10-21 ENCOUNTER — Ambulatory Visit: Payer: Medicare Other | Admitting: Occupational Therapy

## 2022-10-21 ENCOUNTER — Ambulatory Visit: Payer: Medicare Other

## 2022-10-21 DIAGNOSIS — R2681 Unsteadiness on feet: Secondary | ICD-10-CM

## 2022-10-21 DIAGNOSIS — R293 Abnormal posture: Secondary | ICD-10-CM

## 2022-10-21 DIAGNOSIS — R2689 Other abnormalities of gait and mobility: Secondary | ICD-10-CM | POA: Diagnosis not present

## 2022-10-21 DIAGNOSIS — M6281 Muscle weakness (generalized): Secondary | ICD-10-CM

## 2022-10-21 DIAGNOSIS — R29818 Other symptoms and signs involving the nervous system: Secondary | ICD-10-CM

## 2022-10-21 DIAGNOSIS — G20A2 Parkinson's disease without dyskinesia, with fluctuations: Secondary | ICD-10-CM

## 2022-10-21 DIAGNOSIS — R262 Difficulty in walking, not elsewhere classified: Secondary | ICD-10-CM | POA: Diagnosis not present

## 2022-10-21 NOTE — Therapy (Signed)
OUTPATIENT PHYSICAL THERAPY NEURO TREATMENT NOTE and Progress Note   Patient Name: [name] MRN: [id number] DOB:1936-07-26, 87 y.o., male Today's Date: 10/21/2022   PCP: Donnajean Lopes, MD REFERRING PROVIDER: Star Age, MD    END OF SESSION:  PT End of Session - 10/21/22 1401     Visit Number 26    Number of Visits 30    Date for PT Re-Evaluation 10/31/22    Authorization Type Medicare    Progress Note Due on Visit 41    PT Start Time 1445    PT Stop Time 1530    PT Time Calculation (min) 45 min    Equipment Utilized During Treatment Gait belt    Activity Tolerance Patient tolerated treatment well    Behavior During Therapy WFL for tasks assessed/performed                    Past Medical History:  Diagnosis Date   Ankylosing spondylitis ([place]) dx'd ~ 1974   Arthritis    "back" (08/10/2015)   BENIGN PROSTATIC HYPERTROPHY, WITH OBSTRUCTION 01/15/2010   Bleeding duodenal ulcer    Bleeding esophageal ulcer    Bleeding stomach ulcer    GERD 02/22/2008   History of blood transfusion 1988   "lost ~ 1/2 of my blood volume from multiple bleeding ulcers"   History of hiatal hernia    HYPERGLYCEMIA 11/18/2007   HYPERLIPIDEMIA 02/22/2008   HYPERTENSION, UNSPECIFIED 11/10/2009   Kidney stones    Osteoarthritis    c-spine   PEPTIC ULCER DISEASE 11/17/2007   Situational depression    "son died in [place] 06-25-15"   TIA (transient ischemic attack)    "not that I know of in the past; they are trying to determine if I've had one today (08/10/2015)"   Past Surgical History:  Procedure Laterality Date   CATARACT EXTRACTION W/ INTRAOCULAR LENS  IMPLANT, BILATERAL Bilateral 2013   Patient Active Problem List   Diagnosis Date Noted   Ankylosing spondylitis of cervicothoracic region ([place]) 10/16/2016   TIA (transient ischemic attack) 08/10/2015   Carotid stenosis 08/04/2014   Hyperlipidemia 02/21/2012   BENIGN PROSTATIC HYPERTROPHY, WITH OBSTRUCTION 01/15/2010    Essential hypertension 11/10/2009   GERD 02/22/2008   NEPHROLITHIASIS, HX OF 11/17/2007    ONSET DATE: 06-24-2022, PD for 4 years  REFERRING DIAG: Z91.81 (ICD-10-CM) - History of recent fall G20.C (ICD-10-CM) - Primary parkinsonism  THERAPY DIAG:  Muscle weakness (generalized)  Unsteadiness on feet  Abnormal posture  Other abnormalities of gait and mobility  Difficulty in walking, not elsewhere classified  Parkinson's disease with fluctuating manifestations, unspecified whether dyskinesia present  Rationale for Evaluation and Treatment: Rehabilitation  SUBJECTIVE:  SUBJECTIVE STATEMENT:  Good weekend, uneventful  Pt accompanied by: significant other  PERTINENT HISTORY: PD, complex medical history of peptic ulcer disease with history of upper GI bleed, TIA, depression, degenerative disc disease in the neck, osteoarthritis, kidney stones, hyperlipidemia, hypertension, history of hiatal hernia, BPH, arthritis, and ankylosing spondylitis, and parkinsonism  PAIN:  Are you having pain? No  Slight residual pain in R ribs  PRECAUTIONS: None  WEIGHT BEARING RESTRICTIONS: No  FALLS: Has patient fallen in last 6 months? Yes. Number of falls "several"  LIVING ENVIRONMENT: Lives with: lives with their spouse Lives in: House/apartment Stairs:  stairs inside home to loft, ramp to enter/exit home Has following equipment at home: Gilford Rile - 2 wheeled, since the fall in October  PLOF: [place]: return to PLOF  OBJECTIVE:   TODAY'S TREATMENT: 10/21/22 Activity Comments  Gait training -rollator on paved surface, emphasis on velocity, stride, and maintained through turns  -trekking poles: grass, embankment, roots -retrowalking: 2x25 ft -gait no AD  Strength training Lat row:  3x10 up to 40# Seated row 3x10 up to 40#                    PATIENT EDUCATION: Education details: Stance positions and step strategies to reduce posterior LOB  Person educated: Patient and Spouse Education method: Explanation, Demonstration, Tactile cues, Verbal cues, and Handouts Education comprehension: verbalized understanding and returned demonstration   HOME EXERCISE PROGRAM Last updated: 10/02/21 Access Code: [email or web address] URL: [email or web address] Date: 10/02/2022 Prepared by: [facility]  Program Notes perform with gait belt and wife's supervision  Exercises - Standing Balance in Corner  - 1 x daily - 7 x weekly - 3 sets - 30 sec hold - Narrow Stance with Counter Support  - 1 x daily - 5 x weekly - 2 sets - 10 reps - 30 sec hold - Standing Balance in Corner with Eyes Closed  - 1 x daily - 5 x weekly - 2 sets - 10 reps - 30 sec hold - Sit to Stand with Counter Support  - 1 x daily - 5 x weekly - 2 sets - 5-10 reps - Supine Lower Trunk Rotation  - 1 x daily - 7 x weekly - 3 sets - 10 reps - Supine Bridge  - 1 x daily - 7 x weekly - 3 sets - 10 reps - Thoracic Extension Mobilization on Foam Roll  - 1 x daily - 7 x weekly - 3 sets - 10 reps - 3 sec hold - Seated Shoulder Horizontal Abduction with Resistance  - 1 x daily - 7 x weekly - 3 sets - 10 reps - Forward Backward Weight Shift with Counter Support  - 1 x daily - 5 x weekly - 2 sets - 10 reps    Below measures were taken at time of initial evaluation unless otherwise specified:   DIAGNOSTIC FINDINGS: no intracranial or osseous injuries  COGNITION: Overall cognitive status: Within functional limits for tasks assessed   SENSATION: WFL  COORDINATION: Alternating movements impaired, right ankle limitations pre-morbid from hx of crush injury in childhood Heel to shin mildly impaired right > left Finger to nose WNL Finger opposition: unable to perform  bilaterally    MUSCLE TONE: NT  MUSCLE LENGTH: Presents with bilateral knee flexion contractures, approx 5-10 degrees  DTRs:    POSTURE: rounded shoulders, forward head, and increased thoracic kyphosis  LOWER EXTREMITY ROM:     Active  Right Eval Left Eval  Hip flexion    Hip extension    Hip abduction    Hip adduction    Hip internal rotation    Hip external rotation    Knee flexion    Knee extension 0 0  Ankle dorsiflexion 5 10  Ankle plantarflexion    Ankle inversion    Ankle eversion     (Blank rows = not tested)  LOWER EXTREMITY MMT:  assessed in sitting  MMT Right Eval Left Eval  Hip flexion 4 4  Hip extension    Hip abduction 3+ 3+  Hip adduction 4 4  Hip internal rotation    Hip external rotation    Knee flexion 4 4  Knee extension 4 4  Ankle dorsiflexion 3 3+  Ankle plantarflexion    Ankle inversion    Ankle eversion    (Blank rows = not tested)  BED MOBILITY:  Modified Indep  TRANSFERS: Modified indep to set-up level  Floor:  DNT  RAMP:  NT  CURB:  Level of Assistance: CGA-SBA Assistive device utilized: Environmental consultant - 2 wheeled Curb Comments:   STAIRS: SBA-CGA with HR  GAIT: Gait pattern:  right knee extensor thrust in stance, shuffling, and trunk flexed, deficits during turning Distance walked:  Assistive device utilized: Environmental consultant - 2 wheeled Level of assistance: Modified independence and SBA Comments: unsteady, deficits during turns  FUNCTIONAL TESTS:  5 times sit to stand: 25 sec Timed up and go (TUG): 20-27 sec Push and release test (# of steps): Anterior: 1-2; Posterior multiple small steps lead to LOB   Berg Balance Test: 43/56    GOALS: Goals reviewed with patient? Yes  SHORT TERM GOALS: Target date: 10/03/2022      Patient will perform advanced HEP with caregiver supervision to address balance, gait, postural deviations, and functional strength deficits Baseline: met initial HEP Goal status:GOAL MET with updated  HEP, 10/08/2022   2.  Demonstrate/report independence in household ambulation (no AD) to restore capabilities to PLOF Baseline: modified indep w/ RW household distances Goal status: GOAL NOT MET, 10/08/2022  3.  Demo improved safety with ambulation and reduce risk for falls per TUG test 18 sec w/ or without AD Baseline: 20-27 sec; 10/08/2022 33.59 sec at best with RW Goal status: GOAL NOT MET 10/08/22   LONG TERM GOALS: Target date: 10/31/2022      Demonstrate reduce risk for falls per time 15 sec TUG test with least restrictive AD Baseline: (10/17/22) 32 sec w/ RW, 17 sec without Goal status: IN PROGRESS  2.  Demo/report ability to ambulate uneven surfaces with walking stick in order to return to walking activity with spouse Baseline:  Goal status: IN PROGRESS  3.  Demonstrate improved balance and low risk for falls per [facility] Balance Test score of 49/56 Baseline: 43/56; (10/17/22) 42/56 Goal status: IN PROGRESS     ASSESSMENT:  CLINICAL IMPRESSION: Continued with activities to promote gait on uneven surfaces and managing grassy surfaces, embankments, and environmental obstacles with pt spouse present using trekking poles to enable safe return to hiking activities. Use of rollator on paved surfaces to promote increased velocity and with emphasis on training caregiver to facilitate safe strategies to reduce freezing and promote stride length for paved walking trail surfaces.  Increased upper body strength evident with pulling motions. Continued surfaces to promote dynamic balance and coordination  OBJECTIVE IMPAIRMENTS: Abnormal gait, decreased activity tolerance, decreased balance, decreased coordination, decreased knowledge of use of DME, difficulty walking, decreased ROM,  decreased strength, dizziness, impaired flexibility, improper body mechanics, and postural dysfunction.   ACTIVITY LIMITATIONS: carrying, lifting, bending, standing, squatting, stairs, transfers, bed mobility, bathing,  dressing, reach over head, and locomotion level  PARTICIPATION LIMITATIONS: meal prep, cleaning, laundry, shopping, community activity, and yard work  PERSONAL FACTORS: Age, Time since onset of injury/illness/exacerbation, and 3+ comorbidities: see medical hx  are also affecting patient's functional outcome.   REHAB POTENTIAL: Good  CLINICAL DECISION MAKING: Evolving/moderate complexity  EVALUATION COMPLEXITY: Moderate  PLAN:  PT FREQUENCY: 2x/week  PT DURATION: other: 7 weeks  recommend additional 15 sessions  PLANNED INTERVENTIONS: Therapeutic exercises, Therapeutic activity, Neuromuscular re-education, Balance training, Gait training, Patient/Family education, Self Care, Joint mobilization, Stair training, Vestibular training, Canalith repositioning, DME instructions, Aquatic Therapy, Dry Needling, Electrical stimulation, Wheelchair mobility training, Spinal mobilization, Cryotherapy, Moist heat, Taping, and Manual therapy  PLAN FOR NEXT SESSION:  Gait train w/ pt and spouse outside to facilitate paved walking trail program using rollator and/or trekking poles  3:14 PM, 10/21/22 M. Sherlyn Lees, PT, DPT Physical Therapist- [place] Office Number: [phone]    [place] at [facility] [address] [place], [place] [address] Phone # [phone] Fax # [phone]

## 2022-10-21 NOTE — Therapy (Signed)
OUTPATIENT OCCUPATIONAL THERAPY Treatment Session  Patient Name: [name] MRN: [id number] DOB:1936-07-27, 87 y.o., male Today's Date: 10/21/2022  PCP: Donnajean Lopes, MD REFERRING PROVIDER: Star Age, MD      END OF SESSION:  OT End of Session - 10/21/22 1408     Visit Number 24    Number of Visits 25    Date for OT Re-Evaluation 10/25/22    Authorization Type Medicare A &B    OT Start Time [id number]    OT Stop Time [id number]    OT Time Calculation (min) 40 min                                  Past Medical History:  Diagnosis Date   Ankylosing spondylitis ([place]) dx'd ~ 1974   Arthritis    "back" (08/10/2015)   BENIGN PROSTATIC HYPERTROPHY, WITH OBSTRUCTION 01/15/2010   Bleeding duodenal ulcer    Bleeding esophageal ulcer    Bleeding stomach ulcer    GERD 02/22/2008   History of blood transfusion 1988   "lost ~ 1/2 of my blood volume from multiple bleeding ulcers"   History of hiatal hernia    HYPERGLYCEMIA 11/18/2007   HYPERLIPIDEMIA 02/22/2008   HYPERTENSION, UNSPECIFIED 11/10/2009   Kidney stones    Osteoarthritis    c-spine   PEPTIC ULCER DISEASE 11/17/2007   Situational depression    "son died in [place] 06-14-2015"   TIA (transient ischemic attack)    "not that I know of in the past; they are trying to determine if I've had one today (08/10/2015)"   Past Surgical History:  Procedure Laterality Date   CATARACT EXTRACTION W/ INTRAOCULAR LENS  IMPLANT, BILATERAL Bilateral 2013   Patient Active Problem List   Diagnosis Date Noted   Ankylosing spondylitis of cervicothoracic region ([place]) 10/16/2016   TIA (transient ischemic attack) 08/10/2015   Carotid stenosis 08/04/2014   Hyperlipidemia 02/21/2012   BENIGN PROSTATIC HYPERTROPHY, WITH OBSTRUCTION 01/15/2010   Essential hypertension 11/10/2009   GERD 02/22/2008   NEPHROLITHIASIS, HX OF 11/17/2007    ONSET DATE: referral date 07/04/22   REFERRING DIAG: G20.C (ICD-10-CM) - Parkinsonism,  unspecified Z91.81 (ICD-10-CM) - History of falling  THERAPY DIAG:  Other symptoms and signs involving the nervous system  Muscle weakness (generalized)  Unsteadiness on feet  Abnormal posture  Rationale for Evaluation and Treatment: Rehabilitation  SUBJECTIVE:   SUBJECTIVE STATEMENT: Pt's spouse reports "slump" after having a fall last week and requiring days to improve/regain confidence.  Pt accompanied by: self and significant other  PERTINENT HISTORY: history of Parkinson's and ankylosing spondylitis   PRECAUTIONS: Fall  WEIGHT BEARING RESTRICTIONS: No  PAIN:  Are you having pain? No  FALLS: Has patient fallen in last 6 months? Yes. Number of falls 6 (3-4 since "big fall" in 2023-06-14)  LIVING ENVIRONMENT: Lives with: lives with their spouse Lives in: House/apartment Stairs: Yes: External: 2-3 steps; has had a ramp built just in last week Internal: steps to basement and steps to loft - however pt does not need to go up/down those at this time. Has following equipment at home: Gil[employer] Rile - 2 wheeled, bed side commode, Ramped entry, and transport chair  PLOF: Independent  PATIENT GOALS: to be able to feel confident in any sort of movement to resume prior activities  OBJECTIVE:   HAND DOMINANCE: Right  ADLs: Overall ADLs: Prior to fall in 06-13-2022, pt Independent with all ADLs  and IADLs without use of AE/DME, even completing shower in standing in walk-in shower. Transfers/ambulation related to ADLs: fluctuates with mobility; now using RW for all mobility, Supervision with all mobility Eating: Mod I Grooming: wife will assist with opening items; otherwise setup and supervision due to balance UB Dressing: Min-mod assist, buttons/zippers are quite challenging LB Dressing: Mod assist, is able to don socks/shoes with increased time Toileting: Max assist Bathing: sponge baths since fall, max assist from wife at this time Tub Shower transfers: has walk-in shower, but does  not feel safe in shower since fall in Oct Equipment: bed side commode  IADLs: Meal Prep: since fall in October 2023, pt has been assisting with 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  Pt currently cutting foods for meal prep in sitting position. Medication management: wife assisting Handwriting:  TBA  MOBILITY STATUS: Needs Assist: CGA with mobility with RW and Hx of falls  POSTURE COMMENTS:  rounded shoulders and forward head  FUNCTIONAL OUTCOME MEASURES: Physical performance test: PPT #4 donning/doffing jacket: unable to complete due to LOB* 5 time sit > stand:  1:05.94 with up to mod assist and cues to scoot forward towards edge of chair and needing to push up from RW.  UPPER EXTREMITY ROM:  all movements are slowed, decreased shoulder flexion d/t ankylosing spondylitis  Active ROM Right eval Left eval  Shoulder flexion 90 90  Shoulder abduction    Shoulder adduction    Shoulder extension    Shoulder internal rotation [facility]  Shoulder external rotation [facility]  Elbow flexion [facility] WFL  Elbow extension [facility] WFL  Wrist flexion    Wrist extension    Wrist ulnar deviation    Wrist radial deviation    Wrist pronation    Wrist supination    (Blank rows = not tested)  UPPER EXTREMITY MMT:     MMT Right eval Left eval  Shoulder flexion 4/5 4/5  Shoulder abduction    Shoulder adduction    Shoulder extension    Shoulder internal rotation    Shoulder external rotation    Middle trapezius    Lower trapezius    Elbow flexion 4/5 4/5  Elbow extension 4/5 4/5  Wrist flexion    Wrist extension    Wrist ulnar deviation    Wrist radial deviation    Wrist pronation    Wrist supination    (Blank rows = not tested)  HAND FUNCTION: TBD  COORDINATION: 09/12/22 9 Hole Peg test: Right: 50.47 sec; Left: able to place 7 pegs in 2 mins time limit ** sec Box and Blocks:  Right 41blocks, Left 36 blocks hitting barrier x2 on RUE and x6 on LUE  COGNITION: Overall cognitive status:   Pt's spouse reports that his thinking seems slower, but attributes it to stress from all issues presenting s/p fall in October  VISION: Subjective report: glasses broke with recent fall Baseline vision: Wears glasses all the time Visual history: cataracts  VISION ASSESSMENT: Not tested  Patient has difficulty with following activities due to following visual impairments: currently difficult to assess as pt does not have glasses with him, as they broke during fall in October 2023.  OBSERVATIONS: Pt with slowed response time and movements during transfers to/from treatment space and during assessment.   TODAY'S TREATMENT:    10/21/22 Jacket LB dressing: utilizing  UB dressing: spouse is assisting when there is a time constraint.  Is still donning head first then arms, due to routine.  Re-educated  on donning BUE then head to decrease strain Dynamic standing/functional reach for Box and blocks    10/15/22 Dynamic standing: engaged in Connect 4 in standing with pt tolerating standing 10 mins before requiring seated rest break.  Engaged in reaching with R and L UE, crossing midline, and rotating trunk to challenge standing balance and functional reach.  OT providing mod verbal cues for cognitive challenge with novel game of Connect 4 while standing.   Box and Blocks: R: 31 and L: 33  Completed large amplitude wrist flexion/extension and hand opening prior to completing box and blocks a second time.  Pt then completing L: 34 and R: 47. Large amplitude: OT instructed in wrist flexion/extension (to include placing hands on lap or mat table and rocking back/forth to increase WB and ROM), forearm supination/pronation, elbow extension with hand flicks (providing target to facilitate increased ROM), and finger flicks.  Encouraged increased amplitude with each and educated on use prior to completion of tasks requiring increased ROM and [facility].   10/08/22 Large amplitude: OT instructing pt in forearm  supination/pronation and elbow extension with hand flicks with focus on increased amplitude.  OT providing target to facilitate increased ROM and amplitude, especially with forward reaching as pt with progressively smaller movements with repetition. Resistance Clothespins: 2,4,6# with RUE and LUE for mid and high functional reaching and sustained pinch. Pt req'd Mod cues initially, fading to min cues for movement pattern and maintaining good positioning of LUEwith reach. Completed in standing, while incorporating trunk rotation to cross midline.  Pt able to obtain items from lower surface on R compared to L.   Functional reach: engaged in reaching with BUE to place item on higher and lower surfaces to simulate IADLs while increasing ROM with LUE in conjunction with RUE.  LU [employer] for reaching with LUE and RUE with focus on facilitating increased reaching with LUE.  Pt demonstrating improved upright postural control and functional reach with LUE when placed in front of mirror.      10/02/22 LB dressing: OT reiterating multiple options for LB dressing with use of step stool, modified circle sitting with one leg on EOB, use of reacher, and/or figure 4 position.  Pt continues to demonstrate decreased figure 4 with LLE and decreased sequencing and problem solving with attempts to incorporate AE into task.  Encouraged pt and spouse to attempt various strategies to assess which works best for him. Box and blocks: RUE 37 (no hitting barrier), LUE: 35 (hitting barrier x1).  Pt demonstrating slowing bilaterally, however improved quality of movement with decreased hitting of barrier during task.  Discussed functional implications of improved quality of movement while sacrificing quantity.     PATIENT EDUCATION: Education details: ongoing condition specific education.  Provided handout for large amplitude HEP Person educated: Patient and Spouse Education method: Explanation, Demonstration, Verbal cues,  and Handouts Education comprehension: verbalized understanding and needs further education  HOME EXERCISE PROGRAM: Coordination HEP (see pt instructions)  Access Code: [email or web address] URL: [email or web address] Date: 09/25/2022 Prepared by: [facility]  Program Notes Use a target to facilitate increased reach.  Also if you can relate exercises to functional tasks or even complete functional tasks in similar movement pattern.  Exercises - Open Book Chest Stretch on Towel Roll  - 1 x daily - 3 x weekly - 2 sets - 10 reps - Supine PNF D2  - 2 x daily - 3 x weekly - 3 sets - 10 reps -  Shoulder PNF D2 Extension  - 1 x daily - 3 x weekly - 2 sets - 10 reps - Standing Shoulder Internal Rotation AROM Behind Back  - 1 x daily - 3 x weekly - 2 sets - 10 reps - Seated Chest Press  - 1 x daily - 3 x weekly - 2 sets - 10 reps   GOALS: Goals reviewed with patient? Yes  SHORT TERM GOALS: Target date 10/04/22  Pt will be independent in full body HEP with focus on large amplitude movements as needed to increase independence with ADLs. Baseline: Goal status: Met - 10/02/22   2.  Pt will complete UB dressing (except clothing fasteners) with supervision. Baseline: wife assists with buttons occasionally prior to fall Goal status: Met - 10/02/22   LONG TERM GOALS: Target date: 10/25/22  1. Pt will demonstrate/report shower transfers with DME PRN at supervision level. Baseline: Pt is now bathing at shower level with use of shower chair, still requiring HHA with transfers out of walk-in shower Goal status: IN PROGRESS  2.  Pt will complete UB dressing, to include clothing fasteners, at Mod I level with use of AE and/or alternative strategies PRN. Baseline: requiring assist for shirt around shoulders occasionally and assist with buttons 25- 50% of time Goal status: IN PROGRESS  3.  Pt will complete LB dressing Mod I with use of AE and/or alternative  strategies PRN. Baseline:  still having difficulty with pants and tying shoes Goal status: IN PROGRESS  4. Pt will demonstrate improved UE functional use for ADLs as evidenced by increasing box/ blocks score by 4 blocks with LUE. Baseline: Right 41blocks, Left 36 blocks hitting barrier x2 on RUE and x6 on LUE Goal status: IN PROGRESS  5.  Pt will demonstrate improved dynamic standing balance for 15 mins as needed to engage in ADLs/IADLs with increased safety/endurance.   Baseline:  Goal status: IN PROGRESS  6. Pt will demonstrate increased ease with dressing as evidenced by decreasing PPT#4(don/ doff jacket) by 10 secs or more.  Baseline: 57.25 sec on 08/23/22  Goal status: IN PROGRESS  ASSESSMENT:  CLINICAL IMPRESSION: Pt continues to require increased multimodal cues and use of targets and/or functional tasks to facilitate increased ROM and carryover.  Engaged in large amplitude exercises with focus on on ROM and quality of movement, pt demonstrating increased motor control with Box and Blocks after focus on large amplitude movements.  PERFORMANCE DEFICITS: in functional skills including ADLs, IADLs, ROM, strength, pain, flexibility, Fine motor control, Gross motor control, mobility, balance, body mechanics, endurance, decreased knowledge of precautions, decreased knowledge of use of DME, and UE functional use, cognitive skills including attention, memory, safety awareness, sequencing, and thought, and psychosocial skills including environmental adaptation and routines and behaviors.   IMPAIRMENTS: are limiting patient from ADLs and IADLs.   CO-MORBIDITIES: may have co-morbidities  that affects occupational performance. Patient will benefit from skilled OT to address above impairments and improve overall function.  MODIFICATION OR ASSISTANCE TO COMPLETE EVALUATION: Min-Moderate modification of tasks or assist with assess necessary to complete an evaluation.  OT OCCUPATIONAL PROFILE AND  HISTORY: Detailed assessment: Review of records and additional review of physical, cognitive, psychosocial history related to current functional performance.  CLINICAL DECISION MAKING: Moderate - several treatment options, min-mod task modification necessary  REHAB POTENTIAL: Good  EVALUATION COMPLEXITY: Moderate    PLAN:  OT FREQUENCY: 2x/week  OT DURATION: 8 weeks  PLANNED INTERVENTIONS: self care/ADL training, therapeutic exercise, therapeutic activity, neuromuscular re-education, manual  therapy, balance training, functional mobility training, ultrasound, biofeedback, compression bandaging, moist heat, cryotherapy, patient/family education, cognitive remediation/compensation, visual/perceptual remediation/compensation, psychosocial skills training, energy conservation, coping strategies training, and DME and/or AE instructions  RECOMMENDED OTHER SERVICES: N/A  CONSULTED AND AGREED WITH PLAN OF CARE: Patient and family member/caregiver  PLAN FOR NEXT SESSION:  BUE ROM for dressing with focus on large amplitude and posture (clothing fasteners and donning/doffing jacket); Modifications of positioning/alternative strategies for LB dressing, Incorporate coordination activities and large amplitude movements.    Simonne Come, OTR/L 10/21/2022, 2:08 PM

## 2022-10-23 ENCOUNTER — Ambulatory Visit: Payer: Medicare Other

## 2022-10-23 ENCOUNTER — Ambulatory Visit: Payer: Medicare Other | Admitting: Occupational Therapy

## 2022-10-23 DIAGNOSIS — M6281 Muscle weakness (generalized): Secondary | ICD-10-CM

## 2022-10-23 DIAGNOSIS — R2681 Unsteadiness on feet: Secondary | ICD-10-CM

## 2022-10-23 DIAGNOSIS — R293 Abnormal posture: Secondary | ICD-10-CM

## 2022-10-23 DIAGNOSIS — R29818 Other symptoms and signs involving the nervous system: Secondary | ICD-10-CM

## 2022-10-23 DIAGNOSIS — R262 Difficulty in walking, not elsewhere classified: Secondary | ICD-10-CM

## 2022-10-23 DIAGNOSIS — R2689 Other abnormalities of gait and mobility: Secondary | ICD-10-CM

## 2022-10-23 DIAGNOSIS — R278 Other lack of coordination: Secondary | ICD-10-CM

## 2022-10-23 NOTE — Patient Instructions (Signed)
Below is our plan:  We will continue carbidopa-levodopa 1 tablet five times daily. Continue PT/OT and ST. I will help get you in with a new medical device company. Please try to use CPAP as much as possible.   Please make sure you are staying well hydrated. I recommend 50-60 ounces daily. Well balanced diet and regular exercise encouraged. Consistent sleep schedule with 6-8 hours recommended.   Please continue follow up with care team as directed.   Follow up with me in 6 months   You may receive a survey regarding today's visit. I encourage you to leave honest feed back as I do use this information to improve patient care. Thank you for seeing me today!

## 2022-10-23 NOTE — Therapy (Signed)
OUTPATIENT PHYSICAL THERAPY NEURO TREATMENT NOTE    Patient Name: [name] MRN: [id number] DOB:[birth year], 87 y.o., male Today's Date: 10/23/2022   PCP: Donnajean Lopes, MD REFERRING PROVIDER: Star Age, MD    END OF SESSION:  PT End of Session - 10/23/22 1455     Visit Number 27    Number of Visits 30    Date for PT Re-Evaluation 10/31/22    Authorization Type Medicare    Progress Note Due on Visit 59    PT Start Time 1445    PT Stop Time 1530    PT Time Calculation (min) 45 min    Equipment Utilized During Treatment Gait belt    Activity Tolerance Patient tolerated treatment well    Behavior During Therapy WFL for tasks assessed/performed                    Past Medical History:  Diagnosis Date   Ankylosing spondylitis ([place]) dx'd ~ 1974   Arthritis    "back" (08/10/2015)   BENIGN PROSTATIC HYPERTROPHY, WITH OBSTRUCTION 01/15/2010   Bleeding duodenal ulcer    Bleeding esophageal ulcer    Bleeding stomach ulcer    GERD 02/22/2008   History of blood transfusion 1988   "lost ~ 1/2 of my blood volume from multiple bleeding ulcers"   History of hiatal hernia    HYPERGLYCEMIA 11/18/2007   HYPERLIPIDEMIA 02/22/2008   HYPERTENSION, UNSPECIFIED 11/10/2009   Kidney stones    Osteoarthritis    c-spine   PEPTIC ULCER DISEASE 11/17/2007   Situational depression    "son died in [place] 2015-06-22"   TIA (transient ischemic attack)    "not that I know of in the past; they are trying to determine if I've had one today (08/10/2015)"   Past Surgical History:  Procedure Laterality Date   CATARACT EXTRACTION W/ INTRAOCULAR LENS  IMPLANT, BILATERAL Bilateral 2013   Patient Active Problem List   Diagnosis Date Noted   Ankylosing spondylitis of cervicothoracic region ([place]) 10/16/2016   TIA (transient ischemic attack) 08/10/2015   Carotid stenosis 08/04/2014   Hyperlipidemia 02/21/2012   BENIGN PROSTATIC HYPERTROPHY, WITH OBSTRUCTION 01/15/2010   Essential  hypertension 11/10/2009   GERD 02/22/2008   NEPHROLITHIASIS, HX OF 11/17/2007    ONSET DATE: 06-21-22, PD for 4 years  REFERRING DIAG: Z91.81 (ICD-10-CM) - History of recent fall G20.C (ICD-10-CM) - Primary parkinsonism  THERAPY DIAG:  Other symptoms and signs involving the nervous system  Muscle weakness (generalized)  Unsteadiness on feet  Abnormal posture  Other abnormalities of gait and mobility  Difficulty in walking, not elsewhere classified  Rationale for Evaluation and Treatment: Rehabilitation  SUBJECTIVE:  SUBJECTIVE STATEMENT: Feeling good  Pt accompanied by: significant other  PERTINENT HISTORY: PD, complex medical history of peptic ulcer disease with history of upper GI bleed, TIA, depression, degenerative disc disease in the neck, osteoarthritis, kidney stones, hyperlipidemia, hypertension, history of hiatal hernia, BPH, arthritis, and ankylosing spondylitis, and parkinsonism  PAIN:  Are you having pain? No  Slight residual pain in R ribs  PRECAUTIONS: None  WEIGHT BEARING RESTRICTIONS: No  FALLS: Has patient fallen in last 6 months? Yes. Number of falls "several"  LIVING ENVIRONMENT: Lives with: lives with their spouse Lives in: House/apartment Stairs:  stairs inside home to loft, ramp to enter/exit home Has following equipment at home: Gilford Rile - 2 wheeled, since the fall in October  PLOF: [place]: return to PLOF  OBJECTIVE:   TODAY'S TREATMENT: 10/23/22 Activity Comments  PRE 5# 3x10 -LAQ -hip flexion (seated) -standing hip flex--cues in body position to promote max step height  Upper body, compound lift Seated row: 3x10 40# Lat row: 3x10 40#  Gait training -w/ rollator and SBA level surfaces emphasis on increased velocity and managing  sharp 90 and 180 deg turns. SBA-CGA for rollator brake mgmt                   PATIENT EDUCATION: Education details: Stance positions and step strategies to reduce posterior LOB  Person educated: Patient and Spouse Education method: Explanation, Demonstration, Tactile cues, Verbal cues, and Handouts Education comprehension: verbalized understanding and returned demonstration   [place] Last updated: 10/02/21 Access Code: [email or web address] URL: [email or web address] Date: 10/02/2022 Prepared by: [facility]  Program Notes perform with gait belt and wife's supervision  Exercises - Standing Balance in Corner  - 1 x daily - 7 x weekly - 3 sets - 30 sec hold - Narrow Stance with Counter Support  - 1 x daily - 5 x weekly - 2 sets - 10 reps - 30 sec hold - Standing Balance in Corner with Eyes Closed  - 1 x daily - 5 x weekly - 2 sets - 10 reps - 30 sec hold - Sit to Stand with Counter Support  - 1 x daily - 5 x weekly - 2 sets - 5-10 reps - Supine Lower Trunk Rotation  - 1 x daily - 7 x weekly - 3 sets - 10 reps - Supine Bridge  - 1 x daily - 7 x weekly - 3 sets - 10 reps - Thoracic Extension Mobilization on Foam Roll  - 1 x daily - 7 x weekly - 3 sets - 10 reps - 3 sec hold - Seated Shoulder Horizontal Abduction with Resistance  - 1 x daily - 7 x weekly - 3 sets - 10 reps - Forward Backward Weight Shift with Counter Support  - 1 x daily - 5 x weekly - 2 sets - 10 reps    Below measures were taken at time of initial evaluation unless otherwise specified:   DIAGNOSTIC FINDINGS: no intracranial or osseous injuries  COGNITION: Overall cognitive status: Within functional limits for tasks assessed   SENSATION: WFL  COORDINATION: Alternating movements impaired, right ankle limitations pre-morbid from hx of crush injury in childhood Heel to shin mildly impaired right > left Finger to nose WNL Finger opposition: unable to  perform bilaterally    MUSCLE TONE: NT  MUSCLE LENGTH: Presents with bilateral knee flexion contractures, approx 5-10 degrees  DTRs:    POSTURE: rounded shoulders, forward head,  and increased thoracic kyphosis  LOWER EXTREMITY ROM:     Active  Right Eval Left Eval  Hip flexion    Hip extension    Hip abduction    Hip adduction    Hip internal rotation    Hip external rotation    Knee flexion    Knee extension 0 0  Ankle dorsiflexion 5 10  Ankle plantarflexion    Ankle inversion    Ankle eversion     (Blank rows = not tested)  LOWER EXTREMITY MMT:  assessed in sitting  MMT Right Eval Left Eval  Hip flexion 4 4  Hip extension    Hip abduction 3+ 3+  Hip adduction 4 4  Hip internal rotation    Hip external rotation    Knee flexion 4 4  Knee extension 4 4  Ankle dorsiflexion 3 3+  Ankle plantarflexion    Ankle inversion    Ankle eversion    (Blank rows = not tested)  BED MOBILITY:  Modified Indep  TRANSFERS: Modified indep to set-up level  Floor:  DNT  RAMP:  NT  CURB:  Level of Assistance: CGA-SBA Assistive device utilized: Environmental consultant - 2 wheeled Curb Comments:   STAIRS: SBA-CGA with HR  GAIT: Gait pattern:  right knee extensor thrust in stance, shuffling, and trunk flexed, deficits during turning Distance walked:  Assistive device utilized: Environmental consultant - 2 wheeled Level of assistance: Modified independence and SBA Comments: unsteady, deficits during turns  FUNCTIONAL TESTS:  5 times sit to stand: 25 sec Timed up and go (TUG): 20-27 sec Push and release test (# of steps): Anterior: 1-2; Posterior multiple small steps lead to LOB   Berg Balance Test: 43/56    GOALS: Goals reviewed with patient? Yes  SHORT TERM GOALS: Target date: 10/03/2022      Patient will perform advanced HEP with caregiver supervision to address balance, gait, postural deviations, and functional strength deficits Baseline: met initial HEP Goal status:GOAL MET with  updated HEP, 10/08/2022   2.  Demonstrate/report independence in household ambulation (no AD) to restore capabilities to PLOF Baseline: modified indep w/ RW household distances Goal status: GOAL NOT MET, 10/08/2022  3.  Demo improved safety with ambulation and reduce risk for falls per TUG test 18 sec w/ or without AD Baseline: 20-27 sec; 10/08/2022 33.59 sec at best with RW Goal status: GOAL NOT MET 10/08/22   LONG TERM GOALS: Target date: 10/31/2022      Demonstrate reduce risk for falls per time 15 sec TUG test with least restrictive AD Baseline: (10/17/22) 32 sec w/ RW, 17 sec without Goal status: IN PROGRESS  2.  Demo/report ability to ambulate uneven surfaces with walking stick in order to return to walking activity with spouse Baseline:  Goal status: IN PROGRESS  3.  Demonstrate improved balance and low risk for falls per Merrilee Jansky Balance Test score of 49/56 Baseline: 43/56; (10/17/22) 42/56 Goal status: IN PROGRESS     ASSESSMENT:  CLINICAL IMPRESSION: Emphasis on strength training to promote motor control and recruitment to improve power and postural correction for upper body via compound movements and open chain for BLE to improve step height and limb advancement. Continued with gait training to encourage increased velocity and accompanying step length with good effect using rollator. Cues for sequence in brake mgmt. The rollator would be a good device to use with spouse guarding when they walk on paved park trails  OBJECTIVE IMPAIRMENTS: Abnormal gait, decreased activity tolerance, decreased balance, decreased coordination,  decreased knowledge of use of DME, difficulty walking, decreased ROM, decreased strength, dizziness, impaired flexibility, improper body mechanics, and postural dysfunction.   ACTIVITY LIMITATIONS: carrying, lifting, bending, standing, squatting, stairs, transfers, bed mobility, bathing, dressing, reach over head, and locomotion level  PARTICIPATION  LIMITATIONS: meal prep, cleaning, laundry, shopping, community activity, and yard work  PERSONAL FACTORS: Age, Time since onset of injury/illness/exacerbation, and 3+ comorbidities: see medical hx  are also affecting patient's functional outcome.   REHAB POTENTIAL: Good  CLINICAL DECISION MAKING: Evolving/moderate complexity  EVALUATION COMPLEXITY: Moderate  PLAN:  PT FREQUENCY: 2x/week  PT DURATION: other: 7 weeks  recommend additional 15 sessions  PLANNED INTERVENTIONS: Therapeutic exercises, Therapeutic activity, Neuromuscular re-education, Balance training, Gait training, Patient/Family education, Self Care, Joint mobilization, Stair training, Vestibular training, Canalith repositioning, DME instructions, Aquatic Therapy, Dry Needling, Electrical stimulation, Wheelchair mobility training, Spinal mobilization, Cryotherapy, Moist heat, Taping, and Manual therapy  PLAN FOR NEXT SESSION:  Gait train w/ pt and spouse outside to facilitate paved walking trail program using rollator and/or trekking poles. D/C next week?  2:56 PM, 10/23/22 M. Sherlyn Lees, PT, DPT Physical Therapist- [facility] Office Number: [phone]    [facility] [address], [place], [place] [address] Phone # [phone] Fax # [phone]

## 2022-10-23 NOTE — Therapy (Signed)
OUTPATIENT OCCUPATIONAL THERAPY Treatment Session & Discharge Note  Patient Name: [name] MRN: [id number] DOB:Oct 08, 1935, 87 y.o., male Today's Date: 10/23/2022  PCP: Donnajean Lopes, MD REFERRING PROVIDER: Star Age, MD  OCCUPATIONAL THERAPY DISCHARGE SUMMARY  Visits from Start of Care: 25  Current functional level related to goals / functional outcomes: Pt and spouse report that pt has demonstrated improved ease with dressing tasks, however still requiring significantly increased time.  Pt has been able to implement modifications to techniques and positioning to increase success while minimizing time spent on dressing tasks (as possible).    Remaining deficits: Bradykinesia, difficulty with sequencing of novel tasks, decreased ROM due to ankylosing spondylitis, swelling in B hands impacting coordination   Education / Equipment: Education on modifications to techniques with dressing, recommendations on bathroom DME, Coordination and large amplitude HEP   Patient agrees to discharge. Patient goals were partially met. Patient is being discharged due to maximized rehab potential. .       END OF SESSION:  OT End of Session - 10/23/22 1518     Visit Number 25    Number of Visits 25    Date for OT Re-Evaluation 10/25/22    Authorization Type Medicare A &B    OT Start Time 1405    OT Stop Time 1452    OT Time Calculation (min) 47 min                                  Past Medical History:  Diagnosis Date   Ankylosing spondylitis ([place]) dx'd ~ 1974   Arthritis    "back" (08/10/2015)   BENIGN PROSTATIC HYPERTROPHY, WITH OBSTRUCTION 01/15/2010   Bleeding duodenal ulcer    Bleeding esophageal ulcer    Bleeding stomach ulcer    GERD 02/22/2008   History of blood transfusion 1988   "lost ~ 1/2 of my blood volume from multiple bleeding ulcers"   History of hiatal hernia    HYPERGLYCEMIA 11/18/2007   HYPERLIPIDEMIA 02/22/2008   HYPERTENSION,  UNSPECIFIED 11/10/2009   Kidney stones    Osteoarthritis    c-spine   PEPTIC ULCER DISEASE 11/17/2007   Situational depression    "son died in [place] 07/01/15"   TIA (transient ischemic attack)    "not that I know of in the past; they are trying to determine if I've had one today (08/10/2015)"   Past Surgical History:  Procedure Laterality Date   CATARACT EXTRACTION W/ INTRAOCULAR LENS  IMPLANT, BILATERAL Bilateral 2013   Patient Active Problem List   Diagnosis Date Noted   Ankylosing spondylitis of cervicothoracic region ([place]) 10/16/2016   TIA (transient ischemic attack) 08/10/2015   Carotid stenosis 08/04/2014   Hyperlipidemia 02/21/2012   BENIGN PROSTATIC HYPERTROPHY, WITH OBSTRUCTION 01/15/2010   Essential hypertension 11/10/2009   GERD 02/22/2008   NEPHROLITHIASIS, HX OF 11/17/2007    ONSET DATE: referral date 07/04/22   REFERRING DIAG: G20.C (ICD-10-CM) - Parkinsonism, unspecified Z91.81 (ICD-10-CM) - History of falling  THERAPY DIAG:  Other symptoms and signs involving the nervous system  Muscle weakness (generalized)  Unsteadiness on feet  Other lack of coordination  Rationale for Evaluation and Treatment: Rehabilitation  SUBJECTIVE:   SUBJECTIVE STATEMENT: Pt reports placing belt through belt loops prior to donning pants today.  Spouse reports she did not need to help him at all.  Pt accompanied by: self and significant other  PERTINENT HISTORY: history of Parkinson's and ankylosing  spondylitis   PRECAUTIONS: Fall  WEIGHT BEARING RESTRICTIONS: No  PAIN:  Are you having pain? No  FALLS: Has patient fallen in last 6 months? Yes. Number of falls 6 (3-4 since "big fall" in Oct)  LIVING ENVIRONMENT: Lives with: lives with their spouse Lives in: House/apartment Stairs: Yes: External: 2-3 steps; has had a ramp built just in last week Internal: steps to basement and steps to loft - however pt does not need to go up/down those at this time. Has following  equipment at home: [employer] Rile - 2 wheeled, bed side commode, Ramped entry, and transport chair  PLOF: Independent  PATIENT GOALS: to be able to feel confident in any sort of movement to resume prior activities  OBJECTIVE:   HAND DOMINANCE: Right  ADLs: Overall ADLs: Prior to fall in October 2023, pt Independent with all ADLs and IADLs without use of AE/DME, even completing shower in standing in walk-in shower. Transfers/ambulation related to ADLs: fluctuates with mobility; now using RW for all mobility, Supervision with all mobility Eating: Mod I Grooming: wife will assist with opening items; otherwise setup and supervision due to balance UB Dressing: Min-mod assist, buttons/zippers are quite challenging LB Dressing: Mod assist, is able to don socks/shoes with increased time Toileting: Max assist Bathing: sponge baths since fall, max assist from wife at this time Tub Shower transfers: has walk-in shower, but does not feel safe in shower since fall in Oct Equipment: bed side commode  IADLs: Meal Prep: since fall in October 2023, pt has been assisting with 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  Pt currently cutting foods for meal prep in sitting position. Medication management: wife assisting Handwriting:  TBA  MOBILITY STATUS: Needs Assist: CGA with mobility with RW and Hx of falls  POSTURE COMMENTS:  rounded shoulders and forward head  FUNCTIONAL OUTCOME MEASURES: Physical performance test: PPT #4 donning/doffing jacket: unable to complete due to LOB* 5 time sit > stand:  1:05.94 with up to mod assist and cues to scoot forward towards edge of chair and needing to push up from RW.  UPPER EXTREMITY ROM:  all movements are slowed, decreased shoulder flexion d/t ankylosing spondylitis  Active ROM Right eval Left eval  Shoulder flexion 90 90  Shoulder abduction    Shoulder adduction    Shoulder extension    Shoulder internal rotation [facility]  Shoulder external rotation [facility]   Elbow flexion [facility] WFL  Elbow extension [facility] WFL  Wrist flexion    Wrist extension    Wrist ulnar deviation    Wrist radial deviation    Wrist pronation    Wrist supination    (Blank rows = not tested)  UPPER EXTREMITY MMT:     MMT Right eval Left eval  Shoulder flexion 4/5 4/5  Shoulder abduction    Shoulder adduction    Shoulder extension    Shoulder internal rotation    Shoulder external rotation    Middle trapezius    Lower trapezius    Elbow flexion 4/5 4/5  Elbow extension 4/5 4/5  Wrist flexion    Wrist extension    Wrist ulnar deviation    Wrist radial deviation    Wrist pronation    Wrist supination    (Blank rows = not tested)  HAND FUNCTION: TBD  COORDINATION: 09/12/22 9 Hole Peg test: Right: 50.47 sec; Left: able to place 7 pegs in 2 mins time limit ** sec Box and Blocks:  Right 41blocks, Left 36 blocks hitting  barrier x2 on RUE and x6 on LUE  COGNITION: Overall cognitive status:  Pt's spouse reports that his thinking seems slower, but attributes it to stress from all issues presenting s/p fall in October  VISION: Subjective report: glasses broke with recent fall Baseline vision: Wears glasses all the time Visual history: cataracts  VISION ASSESSMENT: Not tested  Patient has difficulty with following activities due to following visual impairments: currently difficult to assess as pt does not have glasses with him, as they broke during fall in October 2023.  OBSERVATIONS: Pt with slowed response time and movements during transfers to/from treatment space and during assessment.   TODAY'S TREATMENT:    10/23/22 Shower transfers: pt reports he is "being cautious" and they could use more modifications to bathroom.  Engaged in discussion of recommendations for remodel if/when they are ready to complete.  Pt's spouse currently providing supervision and will occasionally hold shower door open as pt enters/exits. UB dressing: OT facilitate UB dressing with  use of XL t-shirt with focus on donning BUE first and then pulling over head to allow for increased ease when donning pull over shirt.  Pt continues to benefit from cues and increased time, however wife feels confident that with practice this may be an improved technique from his current technique.  OT reiterated reaching around neck, at collar, as able to increase ease with donning button up shirt due to decreased ability to reach around back. LB dressing: engaged in discussion of use of elastic shoe laces to decrease need for tying shoes.  Discussed [employer] slip ins as option to increase ease with donning shoes while omitting need to fasten shoes.  Pt reports improvements with ability to don pants, socks, and shoes with use of step stool. Box and Blocks: LUE: 33 blocks.  Pt continues to demonstrate inconsistency with functional use of BUE due to swelling in fingers.  Pt also with intermittent dropping of blocks.    10/21/22 ADL: reviewed progress towards goals with focus on modifications to routines and techniques with dressing.  Pt is utilizing step stool for increased ease with LB dressing.  OT educated on threading belt prior to donning pants to decrease need for reaching behind self - pt and spouse appreciative of suggestion.  OT reiterated donning undershirt by threading BUE and then pulling shirt over head to compensate for decreased UE ROM.  Reviewed technique with donning button up shirt and jacket with focus on reaching around top of shoulders vs around mid back to allow for increased ease and reach.  Spouse is still assisting with dressing when there is a time constraint.    10/15/22 Dynamic standing: engaged in Connect 4 in standing with pt tolerating standing 10 mins before requiring seated rest break.  Engaged in reaching with R and L UE, crossing midline, and rotating trunk to challenge standing balance and functional reach.  OT providing mod verbal cues for cognitive challenge with novel  game of Connect 4 while standing.   Box and Blocks: R: 31 and L: 33  Completed large amplitude wrist flexion/extension and hand opening prior to completing box and blocks a second time.  Pt then completing L: 34 and R: 47. Large amplitude: OT instructed in wrist flexion/extension (to include placing hands on lap or mat table and rocking back/forth to increase WB and ROM), forearm supination/pronation, elbow extension with hand flicks (providing target to facilitate increased ROM), and finger flicks.  Encouraged increased amplitude with each and educated on use prior to completion  of tasks requiring increased ROM and [place].   PATIENT EDUCATION: Education details: ongoing condition specific education.  Provided handout for large amplitude HEP Person educated: Patient and Spouse Education method: Explanation, Demonstration, Verbal cues, and Handouts Education comprehension: verbalized understanding and needs further education  HOME EXERCISE PROGRAM: Coordination HEP (see pt instructions)  Access Code: [email or web address] URL: [email or web address] Date: 09/25/2022 Prepared by: [facility]  Program Notes Use a target to facilitate increased reach.  Also if you can relate exercises to functional tasks or even complete functional tasks in similar movement pattern.  Exercises - Open Book Chest Stretch on Towel Roll  - 1 x daily - 3 x weekly - 2 sets - 10 reps - Supine PNF D2  - 2 x daily - 3 x weekly - 3 sets - 10 reps - Shoulder PNF D2 Extension  - 1 x daily - 3 x weekly - 2 sets - 10 reps - Standing Shoulder Internal Rotation AROM Behind Back  - 1 x daily - 3 x weekly - 2 sets - 10 reps - Seated Chest Press  - 1 x daily - 3 x weekly - 2 sets - 10 reps   GOALS: Goals reviewed with patient? Yes  SHORT TERM GOALS: Target date 10/04/22  Pt will be independent in full body HEP with focus on large amplitude movements as needed to increase independence with  ADLs. Baseline: Goal status: Met - 10/02/22   2.  Pt will complete UB dressing (except clothing fasteners) with supervision. Baseline: wife assists with buttons occasionally prior to fall Goal status: Met - 10/02/22   LONG TERM GOALS: Target date: 10/25/22  1. Pt will demonstrate/report shower transfers with DME PRN at supervision level. Baseline: Pt is now bathing at shower level with use of shower chair, still requiring HHA with transfers out of walk-in shower Goal status: MET - spouse provides supervision, will stabilize shower door and provide intermittent cues but on physical assist - 10/23/22  2.  Pt will complete UB dressing, to include clothing fasteners, at Mod I level with use of AE and/or alternative strategies PRN. Baseline: requiring assist for shirt around shoulders occasionally and assist with buttons 25- 50% of time Goal status: MET - 10/23/22  3.  Pt will complete LB dressing Mod I with use of AE and/or alternative strategies PRN. Baseline:  still having difficulty with pants and tying shoes Goal status: MET - 10/23/22  4. Pt will demonstrate improved UE functional use for ADLs as evidenced by increasing box/ blocks score by 4 blocks with LUE. Baseline: Right 41blocks, Left 36 blocks hitting barrier x2 on RUE and x6 on LUE Goal status: Not met - 33 blocks on L on 10/23/22  5.  Pt will demonstrate improved dynamic standing balance for 15 mins as needed to engage in ADLs/IADLs with increased safety/endurance.   Baseline:  Goal status: MET - 10/23/22  6. Pt will demonstrate increased ease with dressing as evidenced by decreasing PPT#4(don/ doff jacket) by 10 secs or more.  Baseline: 57.25 sec on 08/23/22  Goal status: Not met  ASSESSMENT:  CLINICAL IMPRESSION: Pt continues to benefit from massed practice and demonstration with functional items and/or movements to increase carryover.  Pt and spouse report that pt has demonstrated improved ease with dressing tasks, however  still requiring significantly increased time.  OT reiterated modifications to techniques and positioning to increase success while minimizing time spent on tasks (as possible).  Pt continues to  be limited by swelling in BUE, impacting coordination with managing clothing fasteners.  Pt has returned to exercise classes in community and has had a one on one session with instructor.  Pt would benefit from returning to exercises classes as able to continue to demonstrate carryover of exercises and functional movements from therapy sessions.  Discussed recommendation for use of community resources and plan to set up return eval in ~6 months unless pt needing services prior and then he is to contact MD to obtain return eval order.  Pt and spouse in agreement of plan, return eval scheduled.  PERFORMANCE DEFICITS: in functional skills including ADLs, IADLs, ROM, strength, pain, flexibility, Fine motor control, Gross motor control, mobility, balance, body mechanics, endurance, decreased knowledge of precautions, decreased knowledge of use of DME, and UE functional use, cognitive skills including attention, memory, safety awareness, sequencing, and thought, and psychosocial skills including environmental adaptation and routines and behaviors.   IMPAIRMENTS: are limiting patient from ADLs and IADLs.   CO-MORBIDITIES: may have co-morbidities  that affects occupational performance. Patient will benefit from skilled OT to address above impairments and improve overall function.  MODIFICATION OR ASSISTANCE TO COMPLETE EVALUATION: Min-Moderate modification of tasks or assist with assess necessary to complete an evaluation.  OT OCCUPATIONAL PROFILE AND HISTORY: Detailed assessment: Review of records and additional review of physical, cognitive, psychosocial history related to current functional performance.  CLINICAL DECISION MAKING: Moderate - several treatment options, min-mod task modification necessary  REHAB  POTENTIAL: Good  EVALUATION COMPLEXITY: Moderate    PLAN:  OT FREQUENCY: 2x/week  OT DURATION: 8 weeks  PLANNED INTERVENTIONS: self care/ADL training, therapeutic exercise, therapeutic activity, neuromuscular re-education, manual therapy, balance training, functional mobility training, ultrasound, biofeedback, compression bandaging, moist heat, cryotherapy, patient/family education, cognitive remediation/compensation, visual/perceptual remediation/compensation, psychosocial skills training, energy conservation, coping strategies training, and DME and/or AE instructions  RECOMMENDED OTHER SERVICES: N/A  CONSULTED AND AGREED WITH PLAN OF CARE: Patient and family member/caregiver  PLAN FOR NEXT SESSION: Recommend return for eval in ~6 months.   Simonne Come, OTR/L 10/23/2022, 3:19 PM

## 2022-10-23 NOTE — Progress Notes (Unsigned)
No chief complaint on file.   HISTORY OF PRESENT ILLNESS:  10/23/22 ALL:  [name] is a 87 y.o. male here today for follow up for PD. He was last seen by Dr Rexene Alberts 06/2022 and advised to continue carb/levo 1 tablet five times daily. He was encouraged to resume autoPAP.    HISTORY (copied from Dr Guadelupe Sabin previous note)  [name] is an 87 year old right-handed gentleman with an underlying complex medical history of peptic ulcer disease with history of upper GI bleed, TIA, depression, degenerative disc disease in the neck, osteoarthritis, kidney stones, hyperlipidemia, hypertension, history of hiatal hernia, BPH, arthritis, and ankylosing spondylitis, and parkinsonism, who presents for follow-up of his OSA and parkinsonism. The patient is accompanied by his wife again today and presents for a sooner than scheduled appointment after a recent fall with injury.  I last saw him on 01/24/2022, at which time he was compliant with his AutoPap therapy, residual AHI was still elevated and he was still having trouble with mask seal.  He was using Sinemet 4 times a day.   Today, 07/03/2022: He reports feeling okay, reports feeling weak and having difficulty standing and walking at times, he has had trouble with nighttime urination.  He is currently sleeping in a recliner.  He has not been able to use his AutoPap because of the forehead laceration and healing scar which is bothered by the headgear.  His wife provides additional details of his history and reports that since his fall in mid October he has had mobility decline, needing more help with activities of daily living including dressing which was previously independent.  He uses a 2 wheeled walker but sometimes has trouble standing.  He has fallen in the past few weeks a few times, no serious injuries with the exception of the forehead laceration last month.  He had follow-up at the primary care for the stitches, they did not check him for his frequent  urination but he did have an urgent care visit and was checked for urinary tract infection which was negative per wife.  She would like for him to see a urologist.  She would like for him to have PT and OT.   Of note, he presented to the emergency room on 06/06/2022 after a fall, he sustained a scalp 3 through the right side, needing stitches.  I reviewed the emergency room records from [facility], ED department from 06/06/2022.  He had a head CT without contrast and cervical spine CT without contrast on 06/06/2022 and I reviewed the results:   IMPRESSION: 1. No acute intracranial process. 2. No acute osseous injury in the cervical spine.   REVIEW OF SYSTEMS: Out of a complete 14 system review of symptoms, the patient complains only of the following symptoms, and all other reviewed systems are negative.   ALLERGIES: No Known Allergies   HOME MEDICATIONS: Outpatient Medications Prior to Visit  Medication Sig Dispense Refill   aspirin 81 MG tablet Take 81 mg by mouth daily.       atorvastatin (LIPITOR) 10 MG tablet TAKE 1 TABLET BY MOUTH  DAILY 90 tablet 3   carbidopa-levodopa (SINEMET IR) 25-100 MG tablet Take 1 tablet by mouth 5 (five) times daily. Take at 6, 10, 2 PM, 6 PM, 9 PM daily. 450 tablet 3   cefadroxil (DURICEF) 500 MG capsule Take 1 capsule (500 mg total) by mouth 2 (two) times daily. (Patient not taking: Reported on 07/16/2022) 10 capsule 0  cholecalciferol (VITAMIN D3) 25 MCG (1000 UNIT) tablet Take 1 tablet by mouth daily.     famotidine (PEPCID) 40 MG tablet 1 tablet Orally Once a day (Patient not taking: Reported on 07/16/2022)     levothyroxine (SYNTHROID) 50 MCG tablet Take 50 mcg by mouth daily.     losartan-hydrochlorothiazide (HYZAAR) 50-12.5 MG tablet TAKE ONE-HALF TABLET BY  MOUTH DAILY 45 tablet 3   Multiple Vitamin (MULTIVITAMIN WITH MINERALS) TABS tablet Take 1 tablet by mouth daily.     nitroGLYCERIN (NITROSTAT) 0.4 MG SL tablet Place 1 tablet (0.4 mg total)  under the tongue every 5 (five) minutes as needed. 25 tablet 6   pantoprazole (PROTONIX) 20 MG tablet Take 20 mg by mouth daily.     Secukinumab (COSENTYX [place]) Inject into the skin every 30 (thirty) days.     Secukinumab (COSENTYX) 150 MG/ML SOSY 1 ml Subcutaneous one injection monthly for 90 days (Patient not taking: Reported on 07/16/2022)     sulindac (CLINORIL) 150 MG tablet Take 150 mg by mouth daily as needed.     No facility-administered medications prior to visit.     PAST MEDICAL HISTORY: Past Medical History:  Diagnosis Date   Ankylosing spondylitis ([place]) dx'd ~ 1974   Arthritis    "back" (08/10/2015)   BENIGN PROSTATIC HYPERTROPHY, WITH OBSTRUCTION 01/15/2010   Bleeding duodenal ulcer    Bleeding esophageal ulcer    Bleeding stomach ulcer    GERD 02/22/2008   History of blood transfusion 1988   "lost ~ 1/2 of my blood volume from multiple bleeding ulcers"   History of hiatal hernia    HYPERGLYCEMIA 11/18/2007   HYPERLIPIDEMIA 02/22/2008   HYPERTENSION, UNSPECIFIED 11/10/2009   Kidney stones    Osteoarthritis    c-spine   PEPTIC ULCER DISEASE 11/17/2007   Situational depression    "son died in [place] Jun 25, 2015"   TIA (transient ischemic attack)    "not that I know of in the past; they are trying to determine if I've had one today (08/10/2015)"     PAST SURGICAL HISTORY: Past Surgical History:  Procedure Laterality Date   CATARACT EXTRACTION W/ INTRAOCULAR LENS  IMPLANT, BILATERAL Bilateral 2013     FAMILY HISTORY: Family History  Problem Relation Age of Onset   Appendicitis Mother        complications   Colon cancer Father    Diabetes type II Father    Lupus Sister    Colon cancer Other        1st degree relative <60   Parkinson's disease Neg Hx      SOCIAL HISTORY: Social History   Socioeconomic History   Marital status: Married    Spouse name: Not on file   Number of children: Not on file   Years of education: Not on file   Highest education level:  Not on file  Occupational History   Not on file  Tobacco Use   Smoking status: Former    Types: Cigars, Pipe    Quit date: 08/26/1978    Years since quitting: 44.1   Smokeless tobacco: Never  Vaping Use   Vaping Use: Never used  Substance and Sexual Activity   Alcohol use: Yes    Comment: 08/10/2015 "glass of wine ~ month or so; occasionally a can of beer"   Drug use: No   Sexual activity: Yes  Other Topics Concern   Not on file  Social History Narrative   Retired and married.    Social  Determinants of Health   Financial Resource Strain: Not on file  Food Insecurity: Not on file  Transportation Needs: Not on file  Physical Activity: Not on file  Stress: Not on file  Social Connections: Not on file  Intimate Partner Violence: Not on file     PHYSICAL EXAM  There were no vitals filed for this visit. There is no height or weight on file to calculate BMI.  Generalized: Well developed, in no acute distress  Cardiology: normal rate and rhythm, no murmur auscultated  Respiratory: clear to auscultation bilaterally    Neurological examination  Mentation: Alert oriented to time, place, history taking. Follows all commands speech and language fluent Cranial nerve II-XII: Pupils were equal round reactive to light. Extraocular movements were full, visual field were full on confrontational test. Facial sensation and strength were normal. Uvula tongue midline. Head turning and shoulder shrug  were normal and symmetric. Motor: The motor testing reveals 5 over 5 strength of all 4 extremities. Good symmetric motor tone is noted throughout.  Sensory: Sensory testing is intact to soft touch on all 4 extremities. No evidence of extinction is noted.  Coordination: Cerebellar testing reveals good finger-nose-finger and heel-to-shin bilaterally.  Gait and station: Gait is normal. Tandem gait is normal. Romberg is negative. No drift is seen.  Reflexes: Deep tendon reflexes are symmetric and  normal bilaterally.    DIAGNOSTIC DATA (LABS, IMAGING, TESTING) - I reviewed patient records, labs, notes, testing and imaging myself where available.  Lab Results  Component Value Date   WBC 10.3 06/06/2022   HGB 12.3 (L) 06/06/2022   HCT 35.7 (L) 06/06/2022   MCV 100.0 06/06/2022   PLT 239 06/06/2022      Component Value Date/Time   NA 135 06/06/2022 1523   NA 141 09/16/2018 1115   K 3.8 06/06/2022 1523   CL 101 06/06/2022 1523   CO2 26 06/06/2022 1523   GLUCOSE 116 (H) 06/06/2022 1523   BUN 22 06/06/2022 1523   BUN 25 09/16/2018 1115   CREATININE 1.13 06/06/2022 1523   CREATININE 1.19 (H) 10/21/2016 0953   CALCIUM 9.5 06/06/2022 1523   PROT 6.8 09/16/2018 1115   ALBUMIN 5.0 (H) 09/16/2018 1115   AST 22 09/16/2018 1115   ALT 20 09/16/2018 1115   ALKPHOS 69 09/16/2018 1115   BILITOT 1.0 09/16/2018 1115   GFRNONAA >60 06/06/2022 1523   GFRAA 68 09/16/2018 1115   Lab Results  Component Value Date   CHOL 118 11/04/2017   HDL 68.70 11/04/2017   LDLCALC 42 11/04/2017   TRIG 35.0 11/04/2017   CHOLHDL 2 11/04/2017   Lab Results  Component Value Date   HGBA1C 5.9 (H) 08/11/2015   Lab Results  Component Value Date   [id number] 08/11/2015   Lab Results  Component Value Date   TSH 3.29 12/25/2017        No data to display               No data to display           ASSESSMENT AND PLAN  87 y.o. year old male  has a past medical history of Ankylosing spondylitis ([place]) (dx'd ~ 1974), Arthritis, BENIGN PROSTATIC HYPERTROPHY, WITH OBSTRUCTION (01/15/2010), Bleeding duodenal ulcer, Bleeding esophageal ulcer, Bleeding stomach ulcer, GERD (02/22/2008), History of blood transfusion (1988), History of hiatal hernia, HYPERGLYCEMIA (11/18/2007), HYPERLIPIDEMIA (02/22/2008), HYPERTENSION, UNSPECIFIED (11/10/2009), Kidney stones, Osteoarthritis, PEPTIC ULCER DISEASE (11/17/2007), Situational depression, and TIA (transient ischemic attack). here with  No diagnosis  found.  [name] ***.  Healthy lifestyle habits encouraged. *** will follow up with PCP as directed. *** will return to see me in ***, sooner if needed. *** verbalizes understanding and agreement with this plan.   No orders of the defined types were placed in this encounter.    No orders of the defined types were placed in this encounter.    Debbora Presto, MSN, FNP-C 10/23/2022, 5:09 PM  [facility] [address], [place], [place] [address] [phone]

## 2022-10-24 ENCOUNTER — Ambulatory Visit (INDEPENDENT_AMBULATORY_CARE_PROVIDER_SITE_OTHER): Payer: Medicare Other | Admitting: Family Medicine

## 2022-10-24 ENCOUNTER — Encounter: Payer: Self-pay | Admitting: Family Medicine

## 2022-10-24 VITALS — BP 159/79 | HR 90 | Ht 67.0 in | Wt 156.5 lb

## 2022-10-24 DIAGNOSIS — Z789 Other specified health status: Secondary | ICD-10-CM | POA: Diagnosis not present

## 2022-10-24 DIAGNOSIS — G4733 Obstructive sleep apnea (adult) (pediatric): Secondary | ICD-10-CM

## 2022-10-24 DIAGNOSIS — G20C Parkinsonism, unspecified: Secondary | ICD-10-CM | POA: Diagnosis not present

## 2022-10-24 DIAGNOSIS — Z9181 History of falling: Secondary | ICD-10-CM

## 2022-10-24 NOTE — Progress Notes (Signed)
Faxed transfer of care referral for CPAP management to [place] at [phone]. Received fax confirmation.

## 2022-10-28 ENCOUNTER — Encounter: Payer: Self-pay | Admitting: Family Medicine

## 2022-10-29 DIAGNOSIS — N138 Other obstructive and reflux uropathy: Secondary | ICD-10-CM | POA: Diagnosis not present

## 2022-10-29 DIAGNOSIS — N401 Enlarged prostate with lower urinary tract symptoms: Secondary | ICD-10-CM | POA: Diagnosis not present

## 2022-10-30 ENCOUNTER — Ambulatory Visit: Payer: Medicare Other | Attending: Neurology

## 2022-10-30 DIAGNOSIS — R2681 Unsteadiness on feet: Secondary | ICD-10-CM | POA: Diagnosis not present

## 2022-10-30 DIAGNOSIS — M6281 Muscle weakness (generalized): Secondary | ICD-10-CM | POA: Diagnosis not present

## 2022-10-30 DIAGNOSIS — R41841 Cognitive communication deficit: Secondary | ICD-10-CM | POA: Diagnosis not present

## 2022-10-30 DIAGNOSIS — R29818 Other symptoms and signs involving the nervous system: Secondary | ICD-10-CM | POA: Insufficient documentation

## 2022-10-30 DIAGNOSIS — R293 Abnormal posture: Secondary | ICD-10-CM | POA: Diagnosis not present

## 2022-10-30 DIAGNOSIS — R262 Difficulty in walking, not elsewhere classified: Secondary | ICD-10-CM | POA: Diagnosis not present

## 2022-10-30 DIAGNOSIS — R2689 Other abnormalities of gait and mobility: Secondary | ICD-10-CM | POA: Diagnosis not present

## 2022-10-30 DIAGNOSIS — R471 Dysarthria and anarthria: Secondary | ICD-10-CM | POA: Insufficient documentation

## 2022-10-30 NOTE — Therapy (Signed)
OUTPATIENT PHYSICAL THERAPY NEURO TREATMENT NOTE and D/C Summary   Patient Name: [name] MRN: [id number] DOB:[birth year], 87 y.o., male Today's Date: 10/30/2022   PCP: Donnajean Lopes, MD REFERRING PROVIDER: Star Age, MD  PHYSICAL THERAPY DISCHARGE SUMMARY  Visits from Start of Care: 65  Current functional level related to goals / functional outcomes: See below   Remaining deficits: Continues to demo high risk for falls per [employer] and TUG test score   Education / Equipment: HEP for home and community-based   Patient agrees to discharge. Patient goals were partially met. Patient is being discharged due to maximized rehab potential.     END OF SESSION:  PT End of Session - 10/30/22 1047     Visit Number 28    Number of Visits 30    Date for PT Re-Evaluation 10/31/22    Authorization Type Medicare    Progress Note Due on Visit 60    PT Start Time 1100    PT Stop Time 1145    PT Time Calculation (min) 45 min    Equipment Utilized During Treatment Gait belt    Activity Tolerance Patient tolerated treatment well    Behavior During Therapy WFL for tasks assessed/performed                    Past Medical History:  Diagnosis Date   Ankylosing spondylitis ([place]) dx'd ~ 1974   Arthritis    "back" (08/10/2015)   BENIGN PROSTATIC HYPERTROPHY, WITH OBSTRUCTION 01/15/2010   Bleeding duodenal ulcer    Bleeding esophageal ulcer    Bleeding stomach ulcer    GERD 02/22/2008   History of blood transfusion 1988   "lost ~ 1/2 of my blood volume from multiple bleeding ulcers"   History of hiatal hernia    HYPERGLYCEMIA 11/18/2007   HYPERLIPIDEMIA 02/22/2008   HYPERTENSION, UNSPECIFIED 11/10/2009   Kidney stones    Osteoarthritis    c-spine   PEPTIC ULCER DISEASE 11/17/2007   Situational depression    "son died in [place] 07/02/2015"   TIA (transient ischemic attack)    "not that I know of in the past; they are trying to determine if I've had one today  (08/10/2015)"   Past Surgical History:  Procedure Laterality Date   CATARACT EXTRACTION W/ INTRAOCULAR LENS  IMPLANT, BILATERAL Bilateral 2013   Patient Active Problem List   Diagnosis Date Noted   Ankylosing spondylitis of cervicothoracic region ([place]) 10/16/2016   TIA (transient ischemic attack) 08/10/2015   Carotid stenosis 08/04/2014   Hyperlipidemia 02/21/2012   BENIGN PROSTATIC HYPERTROPHY, WITH OBSTRUCTION 01/15/2010   Essential hypertension 11/10/2009   GERD 02/22/2008   NEPHROLITHIASIS, HX OF 11/17/2007    ONSET DATE: 2022/07/01, PD for 4 years  REFERRING DIAG: Z91.81 (ICD-10-CM) - History of recent fall G20.C (ICD-10-CM) - Primary parkinsonism  THERAPY DIAG:  Other symptoms and signs involving the nervous system  Muscle weakness (generalized)  Unsteadiness on feet  Abnormal posture  Other abnormalities of gait and mobility  Difficulty in walking, not elsewhere classified  Rationale for Evaluation and Treatment: Rehabilitation  SUBJECTIVE:  SUBJECTIVE STATEMENT: Feeling pretty good  Pt accompanied by: significant other  PERTINENT HISTORY: PD, complex medical history of peptic ulcer disease with history of upper GI bleed, TIA, depression, degenerative disc disease in the neck, osteoarthritis, kidney stones, hyperlipidemia, hypertension, history of hiatal hernia, BPH, arthritis, and ankylosing spondylitis, and parkinsonism  PAIN:  Are you having pain? No  Slight residual pain in R ribs  PRECAUTIONS: None  WEIGHT BEARING RESTRICTIONS: No  FALLS: Has patient fallen in last 6 months? Yes. Number of falls "several"  LIVING ENVIRONMENT: Lives with: lives with their spouse Lives in: House/apartment Stairs:  stairs inside home to loft, ramp to enter/exit home Has following  equipment at home: Gilford Rile - 2 wheeled, since the fall in October  PLOF: [place]: return to PLOF  OBJECTIVE:   TODAY'S TREATMENT: 10/30/22 Activity Comments  TUG test -w/ RW: 27 sec, 17 sec -w/out RW: 16 sec  PWR moves class education -recommend seated  Gait training -trekking poles -rollator  PWR up w/ trekking poles            TODAY'S TREATMENT: 10/23/22 Activity Comments  PRE 5# 3x10 -LAQ -hip flexion (seated) -standing hip flex--cues in body position to promote max step height  Upper body, compound lift Seated row: 3x10 40# Lat row: 3x10 40#  Gait training -w/ rollator and SBA level surfaces emphasis on increased velocity and managing sharp 90 and 180 deg turns. SBA-CGA for rollator brake mgmt                   PATIENT EDUCATION: Education details: Stance positions and step strategies to reduce posterior LOB  Person educated: Patient and Spouse Education method: Explanation, Demonstration, Tactile cues, Verbal cues, and Handouts Education comprehension: verbalized understanding and returned demonstration   [place] Last updated: 10/02/21 Access Code: [email or web address] URL: [email or web address] Date: 10/02/2022 Prepared by: [facility]  Program Notes perform with gait belt and wife's supervision  Exercises - Standing Balance in Corner  - 1 x daily - 7 x weekly - 3 sets - 30 sec hold - Narrow Stance with Counter Support  - 1 x daily - 5 x weekly - 2 sets - 10 reps - 30 sec hold - Standing Balance in Corner with Eyes Closed  - 1 x daily - 5 x weekly - 2 sets - 10 reps - 30 sec hold - Sit to Stand with Counter Support  - 1 x daily - 5 x weekly - 2 sets - 5-10 reps - Supine Lower Trunk Rotation  - 1 x daily - 7 x weekly - 3 sets - 10 reps - Supine Bridge  - 1 x daily - 7 x weekly - 3 sets - 10 reps - Thoracic Extension Mobilization on Foam Roll  - 1 x daily - 7 x weekly - 3 sets - 10 reps -  3 sec hold - Seated Shoulder Horizontal Abduction with Resistance  - 1 x daily - 7 x weekly - 3 sets - 10 reps - Forward Backward Weight Shift with Counter Support  - 1 x daily - 5 x weekly - 2 sets - 10 reps    Below measures were taken at time of initial evaluation unless otherwise specified:   DIAGNOSTIC FINDINGS: no intracranial or osseous injuries  COGNITION: Overall cognitive status: Within functional limits for tasks assessed   SENSATION: WFL  COORDINATION: Alternating movements impaired, right ankle limitations pre-morbid from hx of crush  injury in childhood Heel to shin mildly impaired right > left Finger to nose WNL Finger opposition: unable to perform bilaterally    MUSCLE TONE: NT  MUSCLE LENGTH: Presents with bilateral knee flexion contractures, approx 5-10 degrees  DTRs:    POSTURE: rounded shoulders, forward head, and increased thoracic kyphosis  LOWER EXTREMITY ROM:     Active  Right Eval Left Eval  Hip flexion    Hip extension    Hip abduction    Hip adduction    Hip internal rotation    Hip external rotation    Knee flexion    Knee extension 0 0  Ankle dorsiflexion 5 10  Ankle plantarflexion    Ankle inversion    Ankle eversion     (Blank rows = not tested)  LOWER EXTREMITY MMT:  assessed in sitting  MMT Right Eval Left Eval  Hip flexion 4 4  Hip extension    Hip abduction 3+ 3+  Hip adduction 4 4  Hip internal rotation    Hip external rotation    Knee flexion 4 4  Knee extension 4 4  Ankle dorsiflexion 3 3+  Ankle plantarflexion    Ankle inversion    Ankle eversion    (Blank rows = not tested)  BED MOBILITY:  Modified Indep  TRANSFERS: Modified indep to set-up level  Floor:  DNT  RAMP:  NT  CURB:  Level of Assistance: CGA-SBA Assistive device utilized: Environmental consultant - 2 wheeled Curb Comments:   STAIRS: SBA-CGA with HR  GAIT: Gait pattern:  right knee extensor thrust in stance, shuffling, and trunk flexed, deficits  during turning Distance walked:  Assistive device utilized: Environmental consultant - 2 wheeled Level of assistance: Modified independence and SBA Comments: unsteady, deficits during turns  FUNCTIONAL TESTS:  5 times sit to stand: 25 sec Timed up and go (TUG): 20-27 sec Push and release test (# of steps): Anterior: 1-2; Posterior multiple small steps lead to LOB   Berg Balance Test: 43/56    GOALS: Goals reviewed with patient? Yes  SHORT TERM GOALS: Target date: 10/03/2022      Patient will perform advanced HEP with caregiver supervision to address balance, gait, postural deviations, and functional strength deficits Baseline: met initial HEP Goal status:GOAL MET with updated HEP, 10/08/2022   2.  Demonstrate/report independence in household ambulation (no AD) to restore capabilities to PLOF Baseline: modified indep w/ RW household distances Goal status: GOAL NOT MET, 10/08/2022  3.  Demo improved safety with ambulation and reduce risk for falls per TUG test 18 sec w/ or without AD Baseline: 20-27 sec; 10/08/2022 33.59 sec at best with RW Goal status: GOAL NOT MET 10/08/22   LONG TERM GOALS: Target date: 10/31/2022      Demonstrate reduce risk for falls per time 15 sec TUG test with least restrictive AD Baseline: (10/17/22) 32 sec w/ RW, 17 sec without; (10/30/22) 16 sec Goal status: NOT MET  2.  Demo/report ability to ambulate uneven surfaces with walking stick in order to return to walking activity with spouse Baseline:  Goal status:PARTIALLY MET  3.  Demonstrate improved balance and low risk for falls per Merrilee Jansky Balance Test score of 49/56 Baseline: 43/56; (10/17/22) 42/56 Goal status: IN PROGRESS     ASSESSMENT:  CLINICAL IMPRESSION: POC details reviewed and rehearsal and strategy with pt and spouse.  Concluded that a rollator with spouse supervision-SBA on paved walking surfaces would be a good start. Using trekking poles for exercise moves in home/gym and  progressing to confidence without  ambulation using devices.  Pt has met rehab potential at this time and feels confident in self-management with spouse support and will perform gym routine and hopefully participate in PD-specific community classes.  OBJECTIVE IMPAIRMENTS: Abnormal gait, decreased activity tolerance, decreased balance, decreased coordination, decreased knowledge of use of DME, difficulty walking, decreased ROM, decreased strength, dizziness, impaired flexibility, improper body mechanics, and postural dysfunction.   ACTIVITY LIMITATIONS: carrying, lifting, bending, standing, squatting, stairs, transfers, bed mobility, bathing, dressing, reach over head, and locomotion level  PARTICIPATION LIMITATIONS: meal prep, cleaning, laundry, shopping, community activity, and yard work  PERSONAL FACTORS: Age, Time since onset of injury/illness/exacerbation, and 3+ comorbidities: see medical hx  are also affecting patient's functional outcome.   REHAB POTENTIAL: Good  CLINICAL DECISION MAKING: Evolving/moderate complexity  EVALUATION COMPLEXITY: Moderate  PLAN:  PT FREQUENCY: 2x/week  PT DURATION: other: 7 weeks  recommend additional 15 sessions  PLANNED INTERVENTIONS: Therapeutic exercises, Therapeutic activity, Neuromuscular re-education, Balance training, Gait training, Patient/Family education, Self Care, Joint mobilization, Stair training, Vestibular training, Canalith repositioning, DME instructions, Aquatic Therapy, Dry Needling, Electrical stimulation, Wheelchair mobility training, Spinal mobilization, Cryotherapy, Moist heat, Taping, and Manual therapy  PLAN FOR NEXT SESSION:  D/C  10:47 AM, 10/30/22 M. Sherlyn Lees, PT, DPT Physical Therapist- [place] Office Number: [phone]    [place] [facility] [address], [place], [place] [address] Phone # [phone] Fax # [phone]

## 2022-11-04 ENCOUNTER — Ambulatory Visit: Payer: Medicare Other

## 2022-11-04 DIAGNOSIS — R2681 Unsteadiness on feet: Secondary | ICD-10-CM | POA: Diagnosis not present

## 2022-11-04 DIAGNOSIS — R29818 Other symptoms and signs involving the nervous system: Secondary | ICD-10-CM | POA: Diagnosis not present

## 2022-11-04 DIAGNOSIS — R41841 Cognitive communication deficit: Secondary | ICD-10-CM

## 2022-11-04 DIAGNOSIS — M6281 Muscle weakness (generalized): Secondary | ICD-10-CM | POA: Diagnosis not present

## 2022-11-04 DIAGNOSIS — R262 Difficulty in walking, not elsewhere classified: Secondary | ICD-10-CM | POA: Diagnosis not present

## 2022-11-04 DIAGNOSIS — R2689 Other abnormalities of gait and mobility: Secondary | ICD-10-CM | POA: Diagnosis not present

## 2022-11-04 DIAGNOSIS — R471 Dysarthria and anarthria: Secondary | ICD-10-CM

## 2022-11-04 DIAGNOSIS — R293 Abnormal posture: Secondary | ICD-10-CM | POA: Diagnosis not present

## 2022-11-04 NOTE — Patient Instructions (Signed)
 "  EVERYDAY" SENTENCES  Where's Norina Buzzard?  (I need to use the bathroom)

## 2022-11-04 NOTE — Therapy (Signed)
OUTPATIENT SPEECH LANGUAGE PATHOLOGY PARKINSON'S TREATMENT   Patient Name: [name] MRN: [id number] DOB:1936/03/14, 87 y.o., male Today's Date: 11/04/2022  PCP: Donnajean Lopes MD REFERRING PROVIDER: Star Age, MD  END OF SESSION:  End of Session - 11/04/22 1016     Visit Number 2    Number of Visits 25    Date for SLP Re-Evaluation 01/09/23    Authorization Type Medicare A & B    SLP Start Time [id number]    SLP Stop Time  1100    SLP Time Calculation (min) 43 min    Activity Tolerance Patient tolerated treatment well              Past Medical History:  Diagnosis Date   Ankylosing spondylitis ([place]) dx'd ~ 1974   Arthritis    "back" (08/10/2015)   BENIGN PROSTATIC HYPERTROPHY, WITH OBSTRUCTION 01/15/2010   Bleeding duodenal ulcer    Bleeding esophageal ulcer    Bleeding stomach ulcer    GERD 02/22/2008   History of blood transfusion 1988   "lost ~ 1/2 of my blood volume from multiple bleeding ulcers"   History of hiatal hernia    HYPERGLYCEMIA 11/18/2007   HYPERLIPIDEMIA 02/22/2008   HYPERTENSION, UNSPECIFIED 11/10/2009   Kidney stones    Osteoarthritis    c-spine   PEPTIC ULCER DISEASE 11/17/2007   Situational depression    "son died in [place] 01-Jul-2015"   TIA (transient ischemic attack)    "not that I know of in the past; they are trying to determine if I've had one today (08/10/2015)"   Past Surgical History:  Procedure Laterality Date   CATARACT EXTRACTION W/ INTRAOCULAR LENS  IMPLANT, BILATERAL Bilateral 2013   Patient Active Problem List   Diagnosis Date Noted   Ankylosing spondylitis of cervicothoracic region ([place]) 10/16/2016   TIA (transient ischemic attack) 08/10/2015   Carotid stenosis 08/04/2014   Hyperlipidemia 02/21/2012   BENIGN PROSTATIC HYPERTROPHY, WITH OBSTRUCTION 01/15/2010   Essential hypertension 11/10/2009   GERD 02/22/2008   NEPHROLITHIASIS, HX OF 11/17/2007    ONSET DATE: 10/02/22 (referral date); PD dx 4 years ago   REFERRING  DIAG:  R49.8 (ICD-10-CM) - Hypophonia  G20.A2 (ICD-10-CM) - Parkinson's disease with fluctuating manifestations, unspecified whether dyskinesia present   THERAPY DIAG: Dysarthria and anarthria  Cognitive communication deficit  Rationale for Evaluation and Treatment: Rehabilitation  SUBJECTIVE:   SUBJECTIVE STATEMENT: "It's when we are in separate rooms." [name], explaining when pt has difficulty in the house)  Pt accompanied by: significant other  PERTINENT HISTORY: PD, complex medical history of peptic ulcer disease with history of upper GI bleed, TIA, depression, degenerative disc disease in the neck, osteoarthritis, kidney stones, hyperlipidemia, hypertension, history of hiatal hernia, BPH, arthritis, and ankylosing spondylitis, and parkinsonism. Referred by [facility] OP team for hypophonia. No prior ST for PD reported.   PAIN: Are you having pain? No   PATIENT GOALS: improve loudness   OBJECTIVE:    PATIENT REPORTED OUTCOME MEASURES (PROM): CES - provided in first 3 sessions  TODAY'S TREATMENT:  11/04/22: Pt and wife to fill out CES next session.  Drooling compensations were covered with pt and wife today. Pt req'd cues x3 for "stop, gather, swallow hard twice". Loud /a/ req'd usual mod cues for loudness, steady pitch, and breath from abdomen. SLP then asked pt about 10 "everyday" sentences and pt generated one in ~4 minutes. SLP told pt and wife to "team up" to think of 7-8 more by putting list given today on refrigerator and write them down as they occur in the next 2-3 days and bring to next ST session.   10/16/22: Educated patient on possible impact of Parkinson's Disease (PD) on speech, swallowing, and cognition. Denied changes in swallowing and minimal changes in cognition (not primary concern at this time). Reported decline in conversational  volume resulting in wife asking for repetition and clarification. Educated patient on ways to optimize vocal intensity, including utilizing principle of intent and using louder speaking volume. Pt was responsive to SLP education and instruction with pt able to optimize volume with cues and modeling for short utterances. Also recommended using intentional swallows to manage saliva more effectively as occasional minimal drooling noted. HEP initiated and provided today. Pt and wife verbalized understanding and agreement with SLP recs.   PATIENT EDUCATION: Education details: see "today's treatment" Person educated: Patient and Spouse Education method: Explanation, Demonstration, and Handouts Education comprehension: verbalized understanding, returned demonstration, and needs further education   GOALS: Goals reviewed with patient? Yes  SHORT TERM GOALS: Target date: 11/14/2022  Pt will produce loud /a/ with at least low 80s dB average over 2 sessions Baseline: Goal status: Ongoing  2.  Pt will demo low 70s dB average in 8/10 sentence responses over 2 sessions Baseline:  Goal status: Ongoing  3.  Pt will demo low 70s dB average in 3-5 minutes simple conversation given occasional min A over 2 sessions  Baseline:  Goal status: Ongoing  4.  Pt will generate abdominal breathing 80% of the time when engaging in 5 minutes simple conversation over 2 sessions Baseline:  Goal status: Ongoing  5.  Pt will effectively manage saliva with volitional swallows for 3 or less episodes given occasional min A over 2 sessions Baseline:  Goal status: Ongoing   LONG TERM GOALS: Target date: 01/09/23   Pt will complete homework (HEP) for at least 7 consecutive days Baseline:  Goal status: Ongoing  2.  Pt will demo WNL volume (low 70s dB average) in 7-10 minutes simple to mod complex conversation with occasional min A over 2 sessions Baseline:  Goal status: Ongoing  3.  Pt will use abdominal breathing  75% of the time in 10 minutes simple-mod complex conversation over 2 sessions  Baseline:  Goal status: Ongoing  4.  Pt will employ memory compensations (as needed) to effectively manage daily tasks given less frequent A from wife by last ST session Baseline:  Goal status: Ongoing  ASSESSMENT:  CLINICAL IMPRESSION: Patient is a 87 y.o. M who was referred for hypophonia secondary to Parkinson's Disease. SLP targeted loud /a/ today and pt req'd usual mod A. During eval, pt was responsive to SLP modeling and cues to increase volume to upper 60s dB for short structured phrases. Denied overt changes in swallowing or cognition (some minimal changes - not primary concern at this time). Some rare throat clearing and drooling exhibited today. Pt would benefit from skilled ST intervention to optimize communication effectiveness, management of saliva, and possibly cognitive compensations to aid daily functioning.   OBJECTIVE  IMPAIRMENTS: Objective impairments include memory, dysarthria, and Sialorrhea . These impairments are limiting patient from household responsibilities and effectively communicating at home and in community.Factors affecting potential to achieve goals and functional outcome are medical prognosis.. Patient will benefit from skilled SLP services to address above impairments and improve overall function.  REHAB POTENTIAL: Good  PLAN:  SLP FREQUENCY: 2x/week  SLP DURATION: 12 weeks  PLANNED INTERVENTIONS: Cueing hierachy, Cognitive reorganization, Internal/external aids, Functional tasks, Multimodal communication approach, SLP instruction and feedback, Compensatory strategies, and Patient/family education    [facility], [place] 11/04/2022, 10:17 AM

## 2022-11-06 ENCOUNTER — Ambulatory Visit: Payer: Medicare Other

## 2022-11-06 DIAGNOSIS — R471 Dysarthria and anarthria: Secondary | ICD-10-CM

## 2022-11-06 DIAGNOSIS — R2689 Other abnormalities of gait and mobility: Secondary | ICD-10-CM | POA: Diagnosis not present

## 2022-11-06 DIAGNOSIS — R293 Abnormal posture: Secondary | ICD-10-CM | POA: Diagnosis not present

## 2022-11-06 DIAGNOSIS — R2681 Unsteadiness on feet: Secondary | ICD-10-CM | POA: Diagnosis not present

## 2022-11-06 DIAGNOSIS — M6281 Muscle weakness (generalized): Secondary | ICD-10-CM | POA: Diagnosis not present

## 2022-11-06 DIAGNOSIS — R29818 Other symptoms and signs involving the nervous system: Secondary | ICD-10-CM | POA: Diagnosis not present

## 2022-11-06 DIAGNOSIS — R41841 Cognitive communication deficit: Secondary | ICD-10-CM

## 2022-11-06 DIAGNOSIS — R262 Difficulty in walking, not elsewhere classified: Secondary | ICD-10-CM | POA: Diagnosis not present

## 2022-11-06 NOTE — Therapy (Signed)
OUTPATIENT SPEECH LANGUAGE PATHOLOGY PARKINSON'S TREATMENT   Patient Name: [name] MRN: [id number] DOB:11-09-1935, 87 y.o., male Today's Date: 11/06/2022  PCP: Donnajean Lopes MD REFERRING PROVIDER: Star Age, MD  END OF SESSION:  End of Session - 11/06/22 1409     Visit Number 3    Number of Visits 25    Date for SLP Re-Evaluation 01/09/23    Authorization Type Medicare A & B    SLP Start Time 1406    SLP Stop Time  [id number]    SLP Time Calculation (min) 39 min    Activity Tolerance Patient tolerated treatment well              Past Medical History:  Diagnosis Date   Ankylosing spondylitis ([place]) dx'd ~ 1974   Arthritis    "back" (08/10/2015)   BENIGN PROSTATIC HYPERTROPHY, WITH OBSTRUCTION 01/15/2010   Bleeding duodenal ulcer    Bleeding esophageal ulcer    Bleeding stomach ulcer    GERD 02/22/2008   History of blood transfusion 1988   "lost ~ 1/2 of my blood volume from multiple bleeding ulcers"   History of hiatal hernia    HYPERGLYCEMIA 11/18/2007   HYPERLIPIDEMIA 02/22/2008   HYPERTENSION, UNSPECIFIED 11/10/2009   Kidney stones    Osteoarthritis    c-spine   PEPTIC ULCER DISEASE 11/17/2007   Situational depression    "son died in [place] 06/30/15"   TIA (transient ischemic attack)    "not that I know of in the past; they are trying to determine if I've had one today (08/10/2015)"   Past Surgical History:  Procedure Laterality Date   CATARACT EXTRACTION W/ INTRAOCULAR LENS  IMPLANT, BILATERAL Bilateral 2013   Patient Active Problem List   Diagnosis Date Noted   Ankylosing spondylitis of cervicothoracic region ([place]) 10/16/2016   TIA (transient ischemic attack) 08/10/2015   Carotid stenosis 08/04/2014   Hyperlipidemia 02/21/2012   BENIGN PROSTATIC HYPERTROPHY, WITH OBSTRUCTION 01/15/2010   Essential hypertension 11/10/2009   GERD 02/22/2008   NEPHROLITHIASIS, HX OF 11/17/2007    ONSET DATE: 10/02/22 (referral date); PD dx 4 years ago   REFERRING  DIAG:  R49.8 (ICD-10-CM) - Hypophonia  G20.A2 (ICD-10-CM) - Parkinson's disease with fluctuating manifestations, unspecified whether dyskinesia present   THERAPY DIAG: Dysarthria and anarthria  Cognitive communication deficit  Rationale for Evaluation and Treatment: Rehabilitation  SUBJECTIVE:   SUBJECTIVE STATEMENT:  "I have the sentences."  Pt accompanied by: significant other  PERTINENT HISTORY: PD, complex medical history of peptic ulcer disease with history of upper GI bleed, TIA, depression, degenerative disc disease in the neck, osteoarthritis, kidney stones, hyperlipidemia, hypertension, history of hiatal hernia, BPH, arthritis, and ankylosing spondylitis, and parkinsonism. Referred by [facility] OP team for hypophonia. No prior ST for PD reported.   PAIN: Are you having pain? Yes: NPRS scale: 5/10 Pain location: R shoulder Pain description: ache Aggravating factors: movement Relieving factors: not moving it   PATIENT GOALS: improve loudness   OBJECTIVE:    PATIENT REPORTED OUTCOME MEASURES (PROM): CES - provided in first 3 sessions  TODAY'S TREATMENT:  11/06/22: CES provided and pt and wife to bring back next session. Drooling compensation with SLP cue x1 today during session. Loud /a/ produced with average low 80s dB, cues necessary for maintaining loudness and pitch for entire production instead of loudness and pitch fade. Everyday sentences were appropriate and were produced with cues for  inhibiting loudness fade consistently. SLP provided pt with simple verbal expression homework for him 20 minutes daily.    11/04/22: Pt and wife to fill out CES next session.  Drooling compensations were covered with pt and wife today. Pt req'd cues x3 for "stop, gather, swallow hard twice". Loud /a/ req'd usual mod cues for loudness, steady pitch,  and breath from abdomen. SLP then asked pt about 10 "everyday" sentences and pt generated one in ~4 minutes. SLP told pt and wife to "team up" to think of 7-8 more by putting list given today on refrigerator and write them down as they occur in the next 2-3 days and bring to next ST session.   10/16/22: Educated patient on possible impact of Parkinson's Disease (PD) on speech, swallowing, and cognition. Denied changes in swallowing and minimal changes in cognition (not primary concern at this time). Reported decline in conversational volume resulting in wife asking for repetition and clarification. Educated patient on ways to optimize vocal intensity, including utilizing principle of intent and using louder speaking volume. Pt was responsive to SLP education and instruction with pt able to optimize volume with cues and modeling for short utterances. Also recommended using intentional swallows to manage saliva more effectively as occasional minimal drooling noted. HEP initiated and provided today. Pt and wife verbalized understanding and agreement with SLP recs.   PATIENT EDUCATION: Education details: see "today's treatment" Person educated: Patient and Spouse Education method: Explanation, Demonstration, and Handouts Education comprehension: verbalized understanding, returned demonstration, and needs further education   GOALS: Goals reviewed with patient? Yes  SHORT TERM GOALS: Target date: 11/14/2022  Pt will produce loud /a/ with at least low 80s dB average over 2 sessions Baseline: Goal status: Ongoing  2.  Pt will demo low 70s dB average in 8/10 sentence responses over 2 sessions Baseline:  Goal status: Ongoing  3.  Pt will demo low 70s dB average in 3-5 minutes simple conversation given occasional min A over 2 sessions  Baseline:  Goal status: Ongoing  4.  Pt will generate abdominal breathing 80% of the time when engaging in 5 minutes simple conversation over 2 sessions Baseline:   Goal status: Ongoing  5.  Pt will effectively manage saliva with volitional swallows for 3 or less episodes given occasional min A over 2 sessions Baseline:  Goal status: Ongoing   LONG TERM GOALS: Target date: 01/09/23   Pt will complete homework (HEP) for at least 7 consecutive days Baseline:  Goal status: Ongoing  2.  Pt will demo WNL volume (low 70s dB average) in 7-10 minutes simple to mod complex conversation with occasional min A over 2 sessions Baseline:  Goal status: Ongoing  3.  Pt will use abdominal breathing 75% of the time in 10 minutes simple-mod complex conversation over 2 sessions  Baseline:  Goal status: Ongoing  4.  Pt will employ memory compensations (as needed) to effectively manage daily tasks given less frequent A from wife by last ST session Baseline:  Goal status: Ongoing  ASSESSMENT:  CLINICAL IMPRESSION: Patient is a 87 y.o. M who was referred for hypophonia secondary to Parkinson's Disease. SLP targeted loud /a/ today and  pt req'd A. Drooling compensations completed with cues from SLP. During eval, pt was responsive to SLP modeling and cues to increase volume to upper 60s dB for short structured phrases. Denied overt changes in swallowing or cognition (some minimal changes - not primary concern at this time). Some rare throat clearing and drooling exhibited today. Pt would benefit from skilled ST intervention to optimize communication effectiveness, management of saliva, and possibly cognitive compensations to aid daily functioning.   OBJECTIVE IMPAIRMENTS: Objective impairments include memory, dysarthria, and Sialorrhea . These impairments are limiting patient from household responsibilities and effectively communicating at home and in community.Factors affecting potential to achieve goals and functional outcome are medical prognosis.. Patient will benefit from skilled SLP services to address above impairments and improve overall function.  REHAB  POTENTIAL: Good  PLAN:  SLP FREQUENCY: 2x/week  SLP DURATION: 12 weeks  PLANNED INTERVENTIONS: Cueing hierachy, Cognitive reorganization, Internal/external aids, Functional tasks, Multimodal communication approach, SLP instruction and feedback, Compensatory strategies, and Patient/family education    [facility], [place] 11/06/2022, 2:11 PM

## 2022-11-12 ENCOUNTER — Ambulatory Visit: Payer: Medicare Other

## 2022-11-12 DIAGNOSIS — M79676 Pain in unspecified toe(s): Secondary | ICD-10-CM | POA: Diagnosis not present

## 2022-11-12 DIAGNOSIS — R29818 Other symptoms and signs involving the nervous system: Secondary | ICD-10-CM | POA: Diagnosis not present

## 2022-11-12 DIAGNOSIS — R293 Abnormal posture: Secondary | ICD-10-CM | POA: Diagnosis not present

## 2022-11-12 DIAGNOSIS — R262 Difficulty in walking, not elsewhere classified: Secondary | ICD-10-CM | POA: Diagnosis not present

## 2022-11-12 DIAGNOSIS — R471 Dysarthria and anarthria: Secondary | ICD-10-CM

## 2022-11-12 DIAGNOSIS — R2689 Other abnormalities of gait and mobility: Secondary | ICD-10-CM | POA: Diagnosis not present

## 2022-11-12 DIAGNOSIS — R2681 Unsteadiness on feet: Secondary | ICD-10-CM | POA: Diagnosis not present

## 2022-11-12 DIAGNOSIS — M6281 Muscle weakness (generalized): Secondary | ICD-10-CM | POA: Diagnosis not present

## 2022-11-12 DIAGNOSIS — B351 Tinea unguium: Secondary | ICD-10-CM | POA: Diagnosis not present

## 2022-11-12 DIAGNOSIS — R41841 Cognitive communication deficit: Secondary | ICD-10-CM

## 2022-11-12 NOTE — Therapy (Signed)
[facility]   Patient Name: [name] MRN: [id number] DOB:[birth year], 87 y.o., male Today's Date: 11/12/2022  PCP: Donnajean Lopes MD REFERRING PROVIDER: Star Age, MD  END OF SESSION:  End of Session - 11/12/22 1915     Visit Number 4    Number of Visits 25    Date for SLP Re-Evaluation 01/09/23    Authorization Type Medicare A & B    SLP Start Time 1022    SLP Stop Time  1105    SLP Time Calculation (min) 43 min    Activity Tolerance Patient tolerated treatment well               Past Medical History:  Diagnosis Date   Ankylosing spondylitis ([place]) dx'd ~ 1974   Arthritis    "back" (08/10/2015)   BENIGN PROSTATIC HYPERTROPHY, WITH OBSTRUCTION 01/15/2010   Bleeding duodenal ulcer    Bleeding esophageal ulcer    Bleeding stomach ulcer    GERD 02/22/2008   History of blood transfusion 1988   "lost ~ 1/2 of my blood volume from multiple bleeding ulcers"   History of hiatal hernia    HYPERGLYCEMIA 11/18/2007   HYPERLIPIDEMIA 02/22/2008   HYPERTENSION, UNSPECIFIED 11/10/2009   Kidney stones    Osteoarthritis    c-spine   PEPTIC ULCER DISEASE 11/17/2007   Situational depression    "son died in [place] June 23, 2015"   TIA (transient ischemic attack)    "not that I know of in the past; they are trying to determine if I've had one today (08/10/2015)"   Past Surgical History:  Procedure Laterality Date   CATARACT EXTRACTION W/ INTRAOCULAR LENS  IMPLANT, BILATERAL Bilateral 2013   Patient Active Problem List   Diagnosis Date Noted   Ankylosing spondylitis of cervicothoracic region ([place]) 10/16/2016   TIA (transient ischemic attack) 08/10/2015   Carotid stenosis 08/04/2014   Hyperlipidemia 02/21/2012   BENIGN PROSTATIC HYPERTROPHY, WITH OBSTRUCTION 01/15/2010   Essential hypertension 11/10/2009   GERD 02/22/2008   NEPHROLITHIASIS, HX OF 11/17/2007    ONSET DATE: 10/02/22 (referral date); PD dx 4 years ago   REFERRING  DIAG:  R49.8 (ICD-10-CM) - Hypophonia  G20.A2 (ICD-10-CM) - Parkinson's disease with fluctuating manifestations, unspecified whether dyskinesia present   THERAPY DIAG: Dysarthria and anarthria  Cognitive communication deficit  Rationale for Evaluation and Treatment: Rehabilitation  SUBJECTIVE:   SUBJECTIVE STATEMENT:  "Thank you for sharing the reason for (loud /a/)."  Pt accompanied by: significant other  PERTINENT HISTORY: PD, complex medical history of peptic ulcer disease with history of upper GI bleed, TIA, depression, degenerative disc disease in the neck, osteoarthritis, kidney stones, hyperlipidemia, hypertension, history of hiatal hernia, BPH, arthritis, and ankylosing spondylitis, and parkinsonism. Referred by [facility] OP team for hypophonia. No prior ST for PD reported.   PAIN: Are you having pain? Yes: NPRS scale: 3/10 Pain location: R back rib cage Pain description: sore Aggravating factors: movement, rubbing Relieving factors: not moving it   PATIENT GOALS: improve loudness   OBJECTIVE:    PATIENT REPORTED OUTCOME MEASURES (PROM): CES - provided 11/05/22 to wife and pt. [name] scored pt 16/40, pt scored himself 18/40 with higher scores as better effectiveness. Josph Macho said talking on the phone was not at all effective with the rest as 2s and 3s. [name]'s answers were all 2s and 3s.  TODAY'S TREATMENT:  11/12/22: CES documented as above. Wife stated "I didn't want you to see any frustrations or negatives" - which leads SLP to believe if her assessment is an accurate representation of pt's communication effectiveness at home. Pt began loud /a/ and incr'd pitch with each successive rep, with varying procedure. SLP ascertained pt has not been performing loud /a/ as frequently as directed. SLP explained rationale for loud /a/ and pt told SLP he  will put forth more effort to accomplish loud /a/ at home, with everyday sentences, and work on the worksheets in order to carryover louder/WNL speech to conversational language/speech.  Pt with consistent hesitation and pausing in conversational speech today demonstrating notable bradyphrenia. Drooling compensations targeted throughout session - pt with minimal awareness.   11/06/22: CES provided and pt and wife to bring back next session. Drooling compensation with SLP cue x1 today during session. Loud /a/ produced with average low 80s dB, cues necessary for maintaining loudness and pitch for entire production instead of loudness and pitch fade. Everyday sentences were appropriate and were produced with cues for  inhibiting loudness fade consistently. SLP provided pt with simple verbal expression homework for him 20 minutes daily.    11/04/22: Pt and wife to fill out CES next session.  Drooling compensations were covered with pt and wife today. Pt req'd cues x3 for "stop, gather, swallow hard twice". Loud /a/ req'd usual mod cues for loudness, steady pitch, and breath from abdomen. SLP then asked pt about 10 "everyday" sentences and pt generated one in ~4 minutes. SLP told pt and wife to "team up" to think of 7-8 more by putting list given today on refrigerator and write them down as they occur in the next 2-3 days and bring to next ST session.   10/16/22: Educated patient on possible impact of Parkinson's Disease (PD) on speech, swallowing, and cognition. Denied changes in swallowing and minimal changes in cognition (not primary concern at this time). Reported decline in conversational volume resulting in wife asking for repetition and clarification. Educated patient on ways to optimize vocal intensity, including utilizing principle of intent and using louder speaking volume. Pt was responsive to SLP education and instruction with pt able to optimize volume with cues and modeling for short utterances.  Also recommended using intentional swallows to manage saliva more effectively as occasional minimal drooling noted. HEP initiated and provided today. Pt and wife verbalized understanding and agreement with SLP recs.   PATIENT EDUCATION: Education details: see "today's treatment" Person educated: Patient and Spouse Education method: Explanation, Demonstration, and Handouts Education comprehension: verbalized understanding, returned demonstration, and needs further education   GOALS: Goals reviewed with patient? Yes  SHORT TERM GOALS: Target date: 11/14/2022  Pt will produce loud /a/ with at least low 80s dB average over 2 sessions Baseline: Goal status: Ongoing  2.  Pt will demo low 70s dB average in 8/10 sentence responses over 2 sessions Baseline:  Goal status: Ongoing  3.  Pt will demo low 70s dB average in 3-5 minutes simple conversation given occasional min A over 2 sessions  Baseline:  Goal status: Ongoing  4.  Pt will generate abdominal breathing 80% of the time when engaging in 5 minutes simple conversation over 2 sessions Baseline:  Goal status: Ongoing  5.  Pt will effectively manage saliva with volitional swallows for 3 or less episodes given occasional min A over 2 sessions Baseline:  Goal status: Ongoing   LONG TERM GOALS: Target date: 01/09/23   Pt will complete  homework (HEP) for at least 7 consecutive days Baseline:  Goal status: Ongoing  2.  Pt will demo WNL volume (low 70s dB average) in 7-10 minutes simple to mod complex conversation with occasional min A over 2 sessions Baseline:  Goal status: Ongoing  3.  Pt will use abdominal breathing 75% of the time in 10 minutes simple-mod complex conversation over 2 sessions  Baseline:  Goal status: Ongoing  4.  Pt will employ memory compensations (as needed) to effectively manage daily tasks given less frequent A from wife by last ST session Baseline:  Goal status: Ongoing  ASSESSMENT:  CLINICAL  IMPRESSION: Patient is a 87 y.o. M who was referred for hypophonia secondary to Parkinson's Disease. SLP targeted loud /a/ today and pt req'd max A. Drooling compensations completed with cues from SLP. During eval, pt was responsive to SLP modeling and cues to increase volume to upper 60s dB for short structured phrases. Denied overt changes in swallowing or cognition (some minimal changes - not primary concern at this time). Some rare throat clearing and drooling exhibited today. Pt would benefit from skilled ST intervention to optimize communication effectiveness, management of saliva, and possibly cognitive compensations to aid daily functioning.   OBJECTIVE IMPAIRMENTS: Objective impairments include memory, dysarthria, and Sialorrhea . These impairments are limiting patient from household responsibilities and effectively communicating at home and in community.Factors affecting potential to achieve goals and functional outcome are medical prognosis.. Patient will benefit from skilled SLP services to address above impairments and improve overall function.  REHAB POTENTIAL: Good  PLAN:  SLP FREQUENCY: 2x/week  SLP DURATION: 12 weeks  PLANNED INTERVENTIONS: Cueing hierachy, Cognitive reorganization, Internal/external aids, Functional tasks, Multimodal communication approach, SLP instruction and feedback, Compensatory strategies, and Patient/family education    [facility], [place] 11/12/2022, 7:16 PM

## 2022-11-14 DIAGNOSIS — M1991 Primary osteoarthritis, unspecified site: Secondary | ICD-10-CM | POA: Diagnosis not present

## 2022-11-14 DIAGNOSIS — Z6824 Body mass index (BMI) 24.0-24.9, adult: Secondary | ICD-10-CM | POA: Diagnosis not present

## 2022-11-14 DIAGNOSIS — Z79899 Other long term (current) drug therapy: Secondary | ICD-10-CM | POA: Diagnosis not present

## 2022-11-14 DIAGNOSIS — R5383 Other fatigue: Secondary | ICD-10-CM | POA: Diagnosis not present

## 2022-11-14 DIAGNOSIS — M459 Ankylosing spondylitis of unspecified sites in spine: Secondary | ICD-10-CM | POA: Diagnosis not present

## 2022-11-19 ENCOUNTER — Ambulatory Visit: Payer: Medicare Other

## 2022-11-19 DIAGNOSIS — R293 Abnormal posture: Secondary | ICD-10-CM | POA: Diagnosis not present

## 2022-11-19 DIAGNOSIS — R2689 Other abnormalities of gait and mobility: Secondary | ICD-10-CM | POA: Diagnosis not present

## 2022-11-19 DIAGNOSIS — R29818 Other symptoms and signs involving the nervous system: Secondary | ICD-10-CM | POA: Diagnosis not present

## 2022-11-19 DIAGNOSIS — R41841 Cognitive communication deficit: Secondary | ICD-10-CM

## 2022-11-19 DIAGNOSIS — R471 Dysarthria and anarthria: Secondary | ICD-10-CM

## 2022-11-19 DIAGNOSIS — M6281 Muscle weakness (generalized): Secondary | ICD-10-CM | POA: Diagnosis not present

## 2022-11-19 DIAGNOSIS — R2681 Unsteadiness on feet: Secondary | ICD-10-CM | POA: Diagnosis not present

## 2022-11-19 DIAGNOSIS — R262 Difficulty in walking, not elsewhere classified: Secondary | ICD-10-CM | POA: Diagnosis not present

## 2022-11-19 NOTE — Therapy (Signed)
OUTPATIENT SPEECH LANGUAGE PATHOLOGY [facility]   Patient Name: [name] MRN: [id number] DOB:01/02/1936, 87 y.o., male Today's Date: 11/19/2022  PCP: Donnajean Lopes MD REFERRING PROVIDER: Star Age, MD  END OF SESSION:  End of Session - 11/19/22 1731     Visit Number 5    Number of Visits 25    Date for SLP Re-Evaluation 01/09/23    Authorization Type Medicare A & B    SLP Start Time [id number]    SLP Stop Time  [id number]    SLP Time Calculation (min) 41 min    Activity Tolerance Patient tolerated treatment well                Past Medical History:  Diagnosis Date   Ankylosing spondylitis ([place]) dx'd ~ 1974   Arthritis    "back" (08/10/2015)   BENIGN PROSTATIC HYPERTROPHY, WITH OBSTRUCTION 01/15/2010   Bleeding duodenal ulcer    Bleeding esophageal ulcer    Bleeding stomach ulcer    GERD 02/22/2008   History of blood transfusion 1988   "lost ~ 1/2 of my blood volume from multiple bleeding ulcers"   History of hiatal hernia    HYPERGLYCEMIA 11/18/2007   HYPERLIPIDEMIA 02/22/2008   HYPERTENSION, UNSPECIFIED 11/10/2009   Kidney stones    Osteoarthritis    c-spine   PEPTIC ULCER DISEASE 11/17/2007   Situational depression    "son died in [place] July 01, 2015"   TIA (transient ischemic attack)    "not that I know of in the past; they are trying to determine if I've had one today (08/10/2015)"   Past Surgical History:  Procedure Laterality Date   CATARACT EXTRACTION W/ INTRAOCULAR LENS  IMPLANT, BILATERAL Bilateral 2013   Patient Active Problem List   Diagnosis Date Noted   Ankylosing spondylitis of cervicothoracic region ([place]) 10/16/2016   TIA (transient ischemic attack) 08/10/2015   Carotid stenosis 08/04/2014   Hyperlipidemia 02/21/2012   BENIGN PROSTATIC HYPERTROPHY, WITH OBSTRUCTION 01/15/2010   Essential hypertension 11/10/2009   GERD 02/22/2008   NEPHROLITHIASIS, HX OF 11/17/2007    ONSET DATE: 10/02/22 (referral date); PD dx 4 years ago   REFERRING  DIAG:  R49.8 (ICD-10-CM) - Hypophonia  G20.A2 (ICD-10-CM) - Parkinson's disease with fluctuating manifestations, unspecified whether dyskinesia present   THERAPY DIAG: No diagnosis found.  Rationale for Evaluation and Treatment: Rehabilitation  SUBJECTIVE:   SUBJECTIVE STATEMENT:  "Some days I got in 5 "ah"s but most days I got in 10 "ah"s."  Pt accompanied by: significant other  PERTINENT HISTORY: PD, complex medical history of peptic ulcer disease with history of upper GI bleed, TIA, depression, degenerative disc disease in the neck, osteoarthritis, kidney stones, hyperlipidemia, hypertension, history of hiatal hernia, BPH, arthritis, and ankylosing spondylitis, and parkinsonism. Referred by [facility] OP team for hypophonia. No prior ST for PD reported.   PAIN: Are you having pain? Yes: NPRS scale: 3/10 Pain location: R back rib cage Pain description: sore Aggravating factors: movement, rubbing Relieving factors: not moving it   PATIENT GOALS: improve loudness   OBJECTIVE:    PATIENT REPORTED OUTCOME MEASURES (PROM): CES - provided 11/05/22 to wife and pt. [name] scored pt 16/40, pt scored himself 18/40 with higher scores as better effectiveness. Josph Macho said talking on the phone was not at all effective with the rest as 2s and 3s. [name]'s answers were all 2s and 3s.  TODAY'S TREATMENT:  11/19/22: Pt reports engaging with worksheets since last session for practicing louder speech.  Loud /a/ today with much improved procedure, obvious that pt has been practicing outside [place] sessions. Loudness average upper 80s dB with consistent loudness fade the last 1 second. Everyday sentences began at low-mid 80s average and ended with upper 70s dB average largely due to fatigue/decr'd attention. SLP engaged pt with sentence responses however pt expanded responses to  2-3 sentences and req'd SLP cues to shorten responses. Average loudness mid 60s dB. Pt with great difficulty with slightly more cognitive load. Cues consistently necessary for saliva management.   11/12/22: CES documented as above. Wife stated "I didn't want you to see any frustrations or negatives" - which leads SLP to believe if her assessment is an accurate representation of pt's communication effectiveness at home. Pt began loud /a/ and incr'd pitch with each successive rep, with varying procedure. SLP ascertained pt has not been performing loud /a/ as frequently as directed. SLP explained rationale for loud /a/ and pt told SLP he will put forth more effort to accomplish loud /a/ at home, with everyday sentences, and work on the worksheets in order to carryover louder/WNL speech to conversational language/speech.  Pt with consistent hesitation and pausing in conversational speech today demonstrating notable bradyphrenia. Drooling compensations targeted throughout session - pt with minimal awareness.   11/06/22: CES provided and pt and wife to bring back next session. Drooling compensation with SLP cue x1 today during session. Loud /a/ produced with average low 80s dB, cues necessary for maintaining loudness and pitch for entire production instead of loudness and pitch fade. Everyday sentences were appropriate and were produced with cues for  inhibiting loudness fade consistently. SLP provided pt with simple verbal expression homework for him 20 minutes daily.    11/04/22: Pt and wife to fill out CES next session.  Drooling compensations were covered with pt and wife today. Pt req'd cues x3 for "stop, gather, swallow hard twice". Loud /a/ req'd usual mod cues for loudness, steady pitch, and breath from abdomen. SLP then asked pt about 10 "everyday" sentences and pt generated one in ~4 minutes. SLP told pt and wife to "team up" to think of 7-8 more by putting list given today on refrigerator and write them  down as they occur in the next 2-3 days and bring to next ST session.   10/16/22: Educated patient on possible impact of Parkinson's Disease (PD) on speech, swallowing, and cognition. Denied changes in swallowing and minimal changes in cognition (not primary concern at this time). Reported decline in conversational volume resulting in wife asking for repetition and clarification. Educated patient on ways to optimize vocal intensity, including utilizing principle of intent and using louder speaking volume. Pt was responsive to SLP education and instruction with pt able to optimize volume with cues and modeling for short utterances. Also recommended using intentional swallows to manage saliva more effectively as occasional minimal drooling noted. HEP initiated and provided today. Pt and wife verbalized understanding and agreement with SLP recs.   PATIENT EDUCATION: Education details: see "today's treatment" Person educated: Patient and Spouse Education method: Explanation, Demonstration, and Handouts Education comprehension: verbalized understanding, returned demonstration, and needs further education   GOALS: Goals reviewed with patient? Yes  SHORT TERM GOALS: Target date: 11/14/2022  Pt will produce loud /a/ with at least low 80s dB average over 2 sessions Baseline: 11/19/22 Goal status: Ongoing  2.  Pt will demo low 70s dB average in 8/10 sentence  responses over 2 sessions Baseline:  Goal status: Ongoing  3.  Pt will demo low 70s dB average in 3-5 minutes simple conversation given occasional min A over 2 sessions  Baseline:  Goal status: Ongoing  4.  Pt will generate abdominal breathing 80% of the time when engaging in 5 minutes simple conversation over 2 sessions Baseline:  Goal status: Ongoing  5.  Pt will effectively manage saliva with volitional swallows for 3 or less episodes given occasional min A over 2 sessions Baseline:  Goal status: Ongoing   LONG TERM GOALS: Target date:  01/09/23   Pt will complete homework (HEP) for at least 7 consecutive days Baseline:  Goal status: Ongoing  2.  Pt will demo WNL volume (low 70s dB average) in 7-10 minutes simple to mod complex conversation with occasional min A over 2 sessions Baseline:  Goal status: Ongoing  3.  Pt will use abdominal breathing 75% of the time in 10 minutes simple-mod complex conversation over 2 sessions  Baseline:  Goal status: Ongoing  4.  Pt will employ memory compensations (as needed) to effectively manage daily tasks given less frequent A from wife by last ST session Baseline:  Goal status: Ongoing  ASSESSMENT:  CLINICAL IMPRESSION: Patient is a 87 y.o. M who was referred for hypophonia secondary to Parkinson's Disease. SLP targeted loud /a/ today and pt req'd max A. Drooling compensations completed with cues from SLP. During eval, pt was responsive to SLP modeling and cues to increase volume to upper 60s dB for short structured phrases. Denied overt changes in swallowing or cognition (some minimal changes - not primary concern at this time). Some rare throat clearing and drooling exhibited today. Pt would benefit from skilled ST intervention to optimize communication effectiveness, management of saliva, and possibly cognitive compensations to aid daily functioning.   OBJECTIVE IMPAIRMENTS: Objective impairments include memory, dysarthria, and Sialorrhea . These impairments are limiting patient from household responsibilities and effectively communicating at home and in community.Factors affecting potential to achieve goals and functional outcome are medical prognosis.. Patient will benefit from skilled SLP services to address above impairments and improve overall function.  REHAB POTENTIAL: Good  PLAN:  SLP FREQUENCY: 2x/week  SLP DURATION: 12 weeks  PLANNED INTERVENTIONS: Cueing hierachy, Cognitive reorganization, Internal/external aids, Functional tasks, Multimodal communication approach,  SLP instruction and feedback, Compensatory strategies, and Patient/family education    [facility], [place] 11/19/2022, 5:32 PM

## 2022-11-21 ENCOUNTER — Ambulatory Visit: Payer: Medicare Other

## 2022-11-21 DIAGNOSIS — M6281 Muscle weakness (generalized): Secondary | ICD-10-CM | POA: Diagnosis not present

## 2022-11-21 DIAGNOSIS — R262 Difficulty in walking, not elsewhere classified: Secondary | ICD-10-CM | POA: Diagnosis not present

## 2022-11-21 DIAGNOSIS — R2681 Unsteadiness on feet: Secondary | ICD-10-CM | POA: Diagnosis not present

## 2022-11-21 DIAGNOSIS — R471 Dysarthria and anarthria: Secondary | ICD-10-CM

## 2022-11-21 DIAGNOSIS — R2689 Other abnormalities of gait and mobility: Secondary | ICD-10-CM | POA: Diagnosis not present

## 2022-11-21 DIAGNOSIS — R293 Abnormal posture: Secondary | ICD-10-CM | POA: Diagnosis not present

## 2022-11-21 DIAGNOSIS — R29818 Other symptoms and signs involving the nervous system: Secondary | ICD-10-CM | POA: Diagnosis not present

## 2022-11-21 DIAGNOSIS — R41841 Cognitive communication deficit: Secondary | ICD-10-CM

## 2022-11-21 NOTE — Therapy (Signed)
OUTPATIENT SPEECH LANGUAGE PATHOLOGY PARKINSON'S TREATMENT   Patient Name: [name] MRN: [id number] DOB:[birth year], 87 y.o., male Today's Date: 11/21/2022  PCP: Donnajean Lopes MD REFERRING PROVIDER: Star Age, MD  END OF SESSION:  End of Session - 11/21/22 1448     Visit Number 6    Number of Visits 25    Date for SLP Re-Evaluation 01/09/23    Authorization Type Medicare A & B    SLP Start Time [id number]    SLP Stop Time  [id number]    SLP Time Calculation (min) 41 min    Activity Tolerance Patient tolerated treatment well                Past Medical History:  Diagnosis Date   Ankylosing spondylitis ([place]) dx'd ~ 1974   Arthritis    "back" (08/10/2015)   BENIGN PROSTATIC HYPERTROPHY, WITH OBSTRUCTION 01/15/2010   Bleeding duodenal ulcer    Bleeding esophageal ulcer    Bleeding stomach ulcer    GERD 02/22/2008   History of blood transfusion 1988   "lost ~ 1/2 of my blood volume from multiple bleeding ulcers"   History of hiatal hernia    HYPERGLYCEMIA 11/18/2007   HYPERLIPIDEMIA 02/22/2008   HYPERTENSION, UNSPECIFIED 11/10/2009   Kidney stones    Osteoarthritis    c-spine   PEPTIC ULCER DISEASE 11/17/2007   Situational depression    "son died in [place] 07/07/2015"   TIA (transient ischemic attack)    "not that I know of in the past; they are trying to determine if I've had one today (08/10/2015)"   Past Surgical History:  Procedure Laterality Date   CATARACT EXTRACTION W/ INTRAOCULAR LENS  IMPLANT, BILATERAL Bilateral 2013   Patient Active Problem List   Diagnosis Date Noted   Ankylosing spondylitis of cervicothoracic region ([place]) 10/16/2016   TIA (transient ischemic attack) 08/10/2015   Carotid stenosis 08/04/2014   Hyperlipidemia 02/21/2012   BENIGN PROSTATIC HYPERTROPHY, WITH OBSTRUCTION 01/15/2010   Essential hypertension 11/10/2009   GERD 02/22/2008   NEPHROLITHIASIS, HX OF 11/17/2007    ONSET DATE: 10/02/22 (referral date); PD dx 4 years ago   REFERRING  DIAG:  R49.8 (ICD-10-CM) - Hypophonia  G20.A2 (ICD-10-CM) - Parkinson's disease with fluctuating manifestations, unspecified whether dyskinesia present   THERAPY DIAG: Cognitive communication deficit  Dysarthria and anarthria  Rationale for Evaluation and Treatment: Rehabilitation  SUBJECTIVE:   SUBJECTIVE STATEMENT:  "What kind of things can we be doing at home for (slowed processing)?" [place])  Pt accompanied by: significant other  PERTINENT HISTORY: PD, complex medical history of peptic ulcer disease with history of upper GI bleed, TIA, depression, degenerative disc disease in the neck, osteoarthritis, kidney stones, hyperlipidemia, hypertension, history of hiatal hernia, BPH, arthritis, and ankylosing spondylitis, and parkinsonism. Referred by [facility] OP team for hypophonia. No prior ST for PD reported.   PAIN: Are you having pain? Yes: NPRS scale: 3/10 Pain location: R back rib cage Pain description: sore Aggravating factors: movement, rubbing Relieving factors: not moving it   PATIENT GOALS: improve loudness   OBJECTIVE:    PATIENT REPORTED OUTCOME MEASURES (PROM): CES - provided 11/05/22 to wife and pt. [name] scored pt 16/40, pt scored himself 18/40 with higher scores as better effectiveness. Josph Macho said talking on the phone was not at all effective with the rest as 2s and 3s. [name]'s answers were all 2s and 3s.  TODAY'S TREATMENT:  11/21/22: Fred completed loud /a/ with upper 80s dB with limited loudness fade today. Everyday sentences were forgotten at home but pt recalled 6 of his10 sentences in the low 80s dB average. It appears as if pt is practicing /a/ and sentences at home.  SLP worked with pt with sentence responses with "3 clues" task however pt had difficulty with reasoning and processing with 75% of the stimuli - wife commented  that she would not have guessed the ones that were more difficult for pt either. Because of this SLP provided more concrete stimuli for homework, yet tasks that would still work with pt's cognition. Pt witih questions if cognition can be  improved - SLP suggested we would work to help cognition be maintained as long as possible with tasks like SLP was sending home. Total A with compensations for anterior labial leakage; suggested postural changes, and gum to diminish/lessen frequency of drooling.  11/19/22: Pt reports engaging with worksheets since last session for practicing louder speech.  Loud /a/ today with much improved procedure, obvious that pt has been practicing outside [place] sessions. Loudness average upper 80s dB with consistent loudness fade the last 1 second. Everyday sentences began at low-mid 80s average and ended with upper 70s dB average largely due to fatigue/decr'd attention. SLP engaged pt with sentence responses however pt expanded responses to 2-3 sentences and req'd SLP cues to shorten responses. Average loudness mid 60s dB. Pt with great difficulty with slightly more cognitive load. Cues consistently necessary for saliva management.   11/12/22: CES documented as above. Wife stated "I didn't want you to see any frustrations or negatives" - which leads SLP to believe if her assessment is an accurate representation of pt's communication effectiveness at home. Pt began loud /a/ and incr'd pitch with each successive rep, with varying procedure. SLP ascertained pt has not been performing loud /a/ as frequently as directed. SLP explained rationale for loud /a/ and pt told SLP he will put forth more effort to accomplish loud /a/ at home, with everyday sentences, and work on the worksheets in order to carryover louder/WNL speech to conversational language/speech.  Pt with consistent hesitation and pausing in conversational speech today demonstrating notable bradyphrenia. Drooling compensations  targeted throughout session - pt with minimal awareness.   11/06/22: CES provided and pt and wife to bring back next session. Drooling compensation with SLP cue x1 today during session. Loud /a/ produced with average low 80s dB, cues necessary for maintaining loudness and pitch for entire production instead of loudness and pitch fade. Everyday sentences were appropriate and were produced with cues for  inhibiting loudness fade consistently. SLP provided pt with simple verbal expression homework for him 20 minutes daily.    11/04/22: Pt and wife to fill out CES next session.  Drooling compensations were covered with pt and wife today. Pt req'd cues x3 for "stop, gather, swallow hard twice". Loud /a/ req'd usual mod cues for loudness, steady pitch, and breath from abdomen. SLP then asked pt about 10 "everyday" sentences and pt generated one in ~4 minutes. SLP told pt and wife to "team up" to think of 7-8 more by putting list given today on refrigerator and write them down as they occur in the next 2-3 days and bring to next ST session.   10/16/22: Educated patient on possible impact of Parkinson's Disease (PD) on speech, swallowing, and cognition. Denied changes in swallowing and minimal changes in cognition (not primary concern at this time). Reported decline in conversational  volume resulting in wife asking for repetition and clarification. Educated patient on ways to optimize vocal intensity, including utilizing principle of intent and using louder speaking volume. Pt was responsive to SLP education and instruction with pt able to optimize volume with cues and modeling for short utterances. Also recommended using intentional swallows to manage saliva more effectively as occasional minimal drooling noted. HEP initiated and provided today. Pt and wife verbalized understanding and agreement with SLP recs.   PATIENT EDUCATION: Education details: see "today's treatment" Person educated: Patient and  Spouse Education method: Explanation, Demonstration, and Handouts Education comprehension: verbalized understanding, returned demonstration, and needs further education   GOALS: Goals reviewed with patient? Yes  SHORT TERM GOALS: Target date: 11/14/2022 (extending to 11/29/22 due to visits)  Pt will produce loud /a/ with at least low 80s dB average over 2 sessions Baseline: 11/19/22 Goal status: met  2.  Pt will demo low 70s dB average in 8/10 sentence responses over 2 sessions Baseline:  Goal status: Ongoing  3.  Pt will demo low 70s dB average in 3-5 minutes simple conversation given occasional min A over 2 sessions  Baseline:  Goal status: Ongoing  4.  Pt will generate abdominal breathing 80% of the time when engaging in 5 minutes simple conversation over 2 sessions Baseline:  Goal status: Ongoing  5.  Pt will effectively manage saliva with volitional swallows for 3 or less episodes given occasional min A over 2 sessions Baseline:  Goal status: Ongoing   LONG TERM GOALS: Target date: 01/09/23   Pt will complete homework (HEP) for at least 7 consecutive days Baseline:  Goal status: Ongoing  2.  Pt will demo WNL volume (low 70s dB average) in 7-10 minutes simple to mod complex conversation with occasional min A over 2 sessions Baseline:  Goal status: Ongoing  3.  Pt will use abdominal breathing 75% of the time in 10 minutes simple-mod complex conversation over 2 sessions  Baseline:  Goal status: Ongoing  4.  Pt will employ memory compensations (as needed) to effectively manage daily tasks given less frequent A from wife by last ST session Baseline:  Goal status: Ongoing  ASSESSMENT:  CLINICAL IMPRESSION: Patient is a 87 y.o. M who was referred for hypophonia secondary to Parkinson's Disease. See "today's treatment" for more information. Drooling compensations completed with cues from SLP. During eval, pt was responsive to SLP modeling and cues to increase volume to  upper 60s dB for short structured phrases. Denied overt changes in swallowing or cognition (some minimal changes - not primary concern at this time). Some rare throat clearing and drooling exhibited today. Pt would benefit from skilled ST intervention to optimize communication effectiveness, management of saliva, and possibly cognitive compensations to aid daily functioning.   OBJECTIVE IMPAIRMENTS: Objective impairments include memory, dysarthria, and Sialorrhea . These impairments are limiting patient from household responsibilities and effectively communicating at home and in community.Factors affecting potential to achieve goals and functional outcome are medical prognosis.. Patient will benefit from skilled SLP services to address above impairments and improve overall function.  REHAB POTENTIAL: Good  PLAN:  SLP FREQUENCY: 2x/week  SLP DURATION: 12 weeks  PLANNED INTERVENTIONS: Cueing hierachy, Cognitive reorganization, Internal/external aids, Functional tasks, Multimodal communication approach, SLP instruction and feedback, Compensatory strategies, and Patient/family education    [facility], [place] 11/21/2022, 4:06 PM

## 2022-11-26 ENCOUNTER — Ambulatory Visit: Payer: Medicare Other | Attending: Neurology

## 2022-11-26 DIAGNOSIS — R41841 Cognitive communication deficit: Secondary | ICD-10-CM | POA: Diagnosis not present

## 2022-11-26 DIAGNOSIS — R471 Dysarthria and anarthria: Secondary | ICD-10-CM | POA: Diagnosis not present

## 2022-11-26 NOTE — Therapy (Signed)
OUTPATIENT SPEECH LANGUAGE PATHOLOGY PARKINSON'S TREATMENT   Patient Name: [name] MRN: [id number] DOB:Oct 19, 1935, 87 y.o., male Today's Date: 11/27/2022  PCP: Donnajean Lopes MD REFERRING PROVIDER: Star Age, MD  END OF SESSION:  End of Session - 11/27/22 0902     Visit Number 7    Number of Visits 25    Date for SLP Re-Evaluation 01/09/23    Authorization Type Medicare A & B    SLP Start Time [id number]    SLP Stop Time  1445    SLP Time Calculation (min) 40 min    Activity Tolerance Patient tolerated treatment well                 Past Medical History:  Diagnosis Date   Ankylosing spondylitis dx'd ~ 1974   Arthritis    "back" (08/10/2015)   BENIGN PROSTATIC HYPERTROPHY, WITH OBSTRUCTION 01/15/2010   Bleeding duodenal ulcer    Bleeding esophageal ulcer    Bleeding stomach ulcer    GERD 02/22/2008   History of blood transfusion 1988   "lost ~ 1/2 of my blood volume from multiple bleeding ulcers"   History of hiatal hernia    HYPERGLYCEMIA 11/18/2007   HYPERLIPIDEMIA 02/22/2008   HYPERTENSION, UNSPECIFIED 11/10/2009   Kidney stones    Osteoarthritis    c-spine   PEPTIC ULCER DISEASE 11/17/2007   Situational depression    "son died in [place] 06-21-2015"   TIA (transient ischemic attack)    "not that I know of in the past; they are trying to determine if I've had one today (08/10/2015)"   Past Surgical History:  Procedure Laterality Date   CATARACT EXTRACTION W/ INTRAOCULAR LENS  IMPLANT, BILATERAL Bilateral 2013   Patient Active Problem List   Diagnosis Date Noted   Ankylosing spondylitis of cervicothoracic region 10/16/2016   TIA (transient ischemic attack) 08/10/2015   Carotid stenosis 08/04/2014   Hyperlipidemia 02/21/2012   BENIGN PROSTATIC HYPERTROPHY, WITH OBSTRUCTION 01/15/2010   Essential hypertension 11/10/2009   GERD 02/22/2008   NEPHROLITHIASIS, HX OF 11/17/2007    ONSET DATE: 10/02/22 (referral date); PD dx 4 years ago   REFERRING DIAG:   R49.8 (ICD-10-CM) - Hypophonia  G20.A2 (ICD-10-CM) - Parkinson's disease with fluctuating manifestations, unspecified whether dyskinesia present   THERAPY DIAG: Cognitive communication deficit  Dysarthria and anarthria  Rationale for Evaluation and Treatment: Rehabilitation  SUBJECTIVE:   SUBJECTIVE STATEMENT:  "I think I may have missed one time." Pt was BID with "ah" and sentences everyday but Sunday.  Pt accompanied by: significant other  PERTINENT HISTORY: PD, complex medical history of peptic ulcer disease with history of upper GI bleed, TIA, depression, degenerative disc disease in the neck, osteoarthritis, kidney stones, hyperlipidemia, hypertension, history of hiatal hernia, BPH, arthritis, and ankylosing spondylitis, and parkinsonism. Referred by [facility] OP team for hypophonia. No prior ST for PD reported.   PAIN: Are you having pain? Yes: NPRS scale: 3/10 Pain location: R back rib cage Pain description: sore Aggravating factors: movement, rubbing Relieving factors: not moving it   PATIENT GOALS: improve loudness   OBJECTIVE:    PATIENT REPORTED OUTCOME MEASURES (PROM): CES - provided 11/05/22 to wife and pt. [name] scored pt 16/40, pt scored himself 18/40 with higher scores as better effectiveness. Josph Macho said talking on the phone was not at all effective with the rest as 2s and 3s. [name]'s answers were all 2s and 3s.  TODAY'S TREATMENT:  11/26/22: Loud /a/ completed today average upper 80s dB (began in low 90s fading to lower-mid 80s dB) with consistent loudness fade after 4 seconds. Better after SLP usual verbal cues for consistent loudness for last two reps. Everyday sentences with average low 80s dB, but sentences got softer as pt progressed through his list. He req'd consistent cues for loudness by the last 3 sentences. SLP worked with  pt on word level responses with his average low70s dB, so SLP made task more complex requiring two word answers with incr'd cognitive load and pt average decr'd to mid-upper 60s dB. This was partly due to pt making responses too long. SLP reiterated shorter responses and this assisted pt in increasing loudness average to upper 60s dB. Pt forgot to begin to chew gum for saliva management prior to session so he inserted gum 5-10 minutes into session and req'd occasional cues to swallow to minimize saliva in oral cavity. SLP explained that next session it would be better to insert gum prior to session.   11/21/22: Fred completed loud /a/ with upper 80s dB with limited loudness fade today. Everyday sentences were forgotten at home but pt recalled 6 of his10 sentences in the low 80s dB average. It appears as if pt is practicing /a/ and sentences at home.  SLP worked with pt with sentence responses with "3 clues" task however pt had difficulty with reasoning and processing with 75% of the stimuli - wife commented that she would not have guessed the ones that were more difficult for pt either. Because of this SLP provided more concrete stimuli for homework, yet tasks that would still work with pt's cognition. Pt witih questions if cognition can be  improved - SLP suggested we would work to help cognition be maintained as long as possible with tasks like SLP was sending home. Total A with compensations for anterior labial leakage; suggested postural changes, and gum to diminish/lessen frequency of drooling.  11/19/22: Pt reports engaging with worksheets since last session for practicing louder speech.  Loud /a/ today with much improved procedure, obvious that pt has been practicing outside [place] sessions. Loudness average upper 80s dB with consistent loudness fade the last 1 second. Everyday sentences began at low-mid 80s average and ended with upper 70s dB average largely due to fatigue/decr'd attention. SLP engaged pt  with sentence responses however pt expanded responses to 2-3 sentences and req'd SLP cues to shorten responses. Average loudness mid 60s dB. Pt with great difficulty with slightly more cognitive load. Cues consistently necessary for saliva management.   11/12/22: CES documented as above. Wife stated "I didn't want you to see any frustrations or negatives" - which leads SLP to believe if her assessment is an accurate representation of pt's communication effectiveness at home. Pt began loud /a/ and incr'd pitch with each successive rep, with varying procedure. SLP ascertained pt has not been performing loud /a/ as frequently as directed. SLP explained rationale for loud /a/ and pt told SLP he will put forth more effort to accomplish loud /a/ at home, with everyday sentences, and work on the worksheets in order to carryover louder/WNL speech to conversational language/speech.  Pt with consistent hesitation and pausing in conversational speech today demonstrating notable bradyphrenia. Drooling compensations targeted throughout session - pt with minimal awareness.   11/06/22: CES provided and pt and wife to bring back next session. Drooling compensation with SLP cue x1 today during session. Loud /a/ produced with average low 80s dB, cues necessary for  maintaining loudness and pitch for entire production instead of loudness and pitch fade. Everyday sentences were appropriate and were produced with cues for  inhibiting loudness fade consistently. SLP provided pt with simple verbal expression homework for him 20 minutes daily.    11/04/22: Pt and wife to fill out CES next session.  Drooling compensations were covered with pt and wife today. Pt req'd cues x3 for "stop, gather, swallow hard twice". Loud /a/ req'd usual mod cues for loudness, steady pitch, and breath from abdomen. SLP then asked pt about 10 "everyday" sentences and pt generated one in ~4 minutes. SLP told pt and wife to "team up" to think of 7-8 more  by putting list given today on refrigerator and write them down as they occur in the next 2-3 days and bring to next ST session.   10/16/22: Educated patient on possible impact of Parkinson's Disease (PD) on speech, swallowing, and cognition. Denied changes in swallowing and minimal changes in cognition (not primary concern at this time). Reported decline in conversational volume resulting in wife asking for repetition and clarification. Educated patient on ways to optimize vocal intensity, including utilizing principle of intent and using louder speaking volume. Pt was responsive to SLP education and instruction with pt able to optimize volume with cues and modeling for short utterances. Also recommended using intentional swallows to manage saliva more effectively as occasional minimal drooling noted. HEP initiated and provided today. Pt and wife verbalized understanding and agreement with SLP recs.   PATIENT EDUCATION: Education details: see "today's treatment" Person educated: Patient and Spouse Education method: Explanation, Demonstration, and Handouts Education comprehension: verbalized understanding, returned demonstration, and needs further education   GOALS: Goals reviewed with patient? Yes  SHORT TERM GOALS: Target date: 11/14/2022 (extending to 11/29/22 due to visits)  Pt will produce loud /a/ with at least low 80s dB average over 2 sessions Baseline: 11/19/22 Goal status: met  2.  Pt will demo low 70s dB average in 8/10 sentence responses over 2 sessions Baseline:  Goal status: Ongoing  3.  Pt will demo low 70s dB average in 3-5 minutes simple conversation given occasional min A over 2 sessions  Baseline:  Goal status: Ongoing  4.  Pt will generate abdominal breathing 80% of the time when engaging in 5 minutes simple conversation over 2 sessions Baseline:  Goal status: Ongoing  5.  Pt will effectively manage saliva with volitional swallows for 3 or less episodes given occasional  min A over 2 sessions Baseline:  Goal status: Ongoing   LONG TERM GOALS: Target date: 01/09/23   Pt will complete homework (HEP) for at least 7 consecutive days Baseline:  Goal status: Ongoing  2.  Pt will demo WNL volume (low 70s dB average) in 7-10 minutes simple to mod complex conversation with occasional min A over 2 sessions Baseline:  Goal status: Ongoing  3.  Pt will use abdominal breathing 75% of the time in 10 minutes simple-mod complex conversation over 2 sessions  Baseline:  Goal status: Ongoing  4.  Pt will employ memory compensations (as needed) to effectively manage daily tasks given less frequent A from wife by last ST session Baseline:  Goal status: Ongoing  ASSESSMENT:  CLINICAL IMPRESSION: Patient is a 87 y.o. M who was referred for hypophonia secondary to Parkinson's Disease. See "today's treatment" for more information. Drooling compensations completed with cues from SLP. During eval, pt was responsive to SLP modeling and cues to increase volume to upper 60s dB for  short structured phrases. Denied overt changes in swallowing or cognition (some minimal changes - not primary concern at this time). Some rare throat clearing and drooling exhibited today. Pt would benefit from skilled ST intervention to optimize communication effectiveness, management of saliva, and possibly cognitive compensations to aid daily functioning.   OBJECTIVE IMPAIRMENTS: Objective impairments include memory, dysarthria, and Sialorrhea . These impairments are limiting patient from household responsibilities and effectively communicating at home and in community.Factors affecting potential to achieve goals and functional outcome are medical prognosis.. Patient will benefit from skilled SLP services to address above impairments and improve overall function.  REHAB POTENTIAL: Good  PLAN:  SLP FREQUENCY: 2x/week  SLP DURATION: 12 weeks  PLANNED INTERVENTIONS: Cueing hierachy, Cognitive  reorganization, Internal/external aids, Functional tasks, Multimodal communication approach, SLP instruction and feedback, Compensatory strategies, and Patient/family education    [facility], [place] 11/27/2022, 9:02 AM

## 2022-11-28 ENCOUNTER — Ambulatory Visit: Payer: Medicare Other

## 2022-11-28 DIAGNOSIS — R471 Dysarthria and anarthria: Secondary | ICD-10-CM

## 2022-11-28 DIAGNOSIS — R41841 Cognitive communication deficit: Secondary | ICD-10-CM | POA: Diagnosis not present

## 2022-11-28 NOTE — Therapy (Signed)
OUTPATIENT SPEECH LANGUAGE PATHOLOGY PARKINSON'S TREATMENT   Patient Name: [name] MRN: [id number] DOB:05/11/1936, 87 y.o., male Today's Date: 11/28/2022  PCP: Donnajean Lopes MD REFERRING PROVIDER: Star Age, MD  END OF SESSION:  End of Session - 11/28/22 1732     Visit Number 8    Number of Visits 25    Date for SLP Re-Evaluation 01/09/23    Authorization Type Medicare A & B    SLP Start Time 1447    SLP Stop Time  1530    SLP Time Calculation (min) 43 min    Activity Tolerance Patient tolerated treatment well                  Past Medical History:  Diagnosis Date   Ankylosing spondylitis dx'd ~ 1974   Arthritis    "back" (08/10/2015)   BENIGN PROSTATIC HYPERTROPHY, WITH OBSTRUCTION 01/15/2010   Bleeding duodenal ulcer    Bleeding esophageal ulcer    Bleeding stomach ulcer    GERD 02/22/2008   History of blood transfusion 1988   "lost ~ 1/2 of my blood volume from multiple bleeding ulcers"   History of hiatal hernia    HYPERGLYCEMIA 11/18/2007   HYPERLIPIDEMIA 02/22/2008   HYPERTENSION, UNSPECIFIED 11/10/2009   Kidney stones    Osteoarthritis    c-spine   PEPTIC ULCER DISEASE 11/17/2007   Situational depression    "son died in [place] 06/10/15"   TIA (transient ischemic attack)    "not that I know of in the past; they are trying to determine if I've had one today (08/10/2015)"   Past Surgical History:  Procedure Laterality Date   CATARACT EXTRACTION W/ INTRAOCULAR LENS  IMPLANT, BILATERAL Bilateral 2013   Patient Active Problem List   Diagnosis Date Noted   Ankylosing spondylitis of cervicothoracic region 10/16/2016   TIA (transient ischemic attack) 08/10/2015   Carotid stenosis 08/04/2014   Hyperlipidemia 02/21/2012   BENIGN PROSTATIC HYPERTROPHY, WITH OBSTRUCTION 01/15/2010   Essential hypertension 11/10/2009   GERD 02/22/2008   NEPHROLITHIASIS, HX OF 11/17/2007    ONSET DATE: 10/02/22 (referral date); PD dx 4 years ago   REFERRING DIAG:   R49.8 (ICD-10-CM) - Hypophonia  G20.A2 (ICD-10-CM) - Parkinson's disease with fluctuating manifestations, unspecified whether dyskinesia present   THERAPY DIAG: No diagnosis found.  Rationale for Evaluation and Treatment: Rehabilitation  SUBJECTIVE:   SUBJECTIVE STATEMENT:  Pt enters chewing gum today.  Pt accompanied by: significant other  PERTINENT HISTORY: PD, complex medical history of peptic ulcer disease with history of upper GI bleed, TIA, depression, degenerative disc disease in the neck, osteoarthritis, kidney stones, hyperlipidemia, hypertension, history of hiatal hernia, BPH, arthritis, and ankylosing spondylitis, and parkinsonism. Referred by [facility] OP team for hypophonia. No prior ST for PD reported.   PAIN: Are you having pain? Yes: NPRS scale: 2/10 Pain location: R back rib cage Pain description: sore Aggravating factors: movement, rubbing Relieving factors: not moving it   PATIENT GOALS: improve loudness   OBJECTIVE:    PATIENT REPORTED OUTCOME MEASURES (PROM): CES - provided 11/05/22 to wife and pt. [name] scored pt 16/40, pt scored himself 18/40 with higher scores as better effectiveness. Josph Macho said talking on the phone was not at all effective with the rest as 2s and 3s. [name]'s answers were all 2s and 3s.  TODAY'S TREATMENT:  11/28/22: With gum today, pt had two drooling incidents over the course of the session, compared to 6-7 in previous sessions without using the gum.  Loud /a/ completed today average upper 80s- lower 90s dB. SLP used voice analyst program to improve pt's consistency with incr'd loudness, and combat pt's loudness fade. Everyday sentences with average low 80s dB, and pt remained largely at same volume as pt progressed through his list, unlike previous session. Sentence level responses (dual meaning words in  which pt provided two separate sentences using the word in the sentence) were average upper 60s to lower 70s dB. Meaning of expressions was not as good as dual meaning words with average mid 60s dB. SLP explained to [place] how to modify tasks which may be too complex cognitively for pt.  11/26/22: Loud /a/ completed today average upper 80s dB (began in low 90s fading to lower-mid 80s dB) with consistent loudness fade after 4 seconds. Better after SLP usual verbal cues for consistent loudness for last two reps. Everyday sentences with average low 80s dB, but sentences got softer as pt progressed through his list. He req'd consistent cues for loudness by the last 3 sentences. SLP worked with pt on word level responses with his average low70s dB, so SLP made task more complex requiring two word answers with incr'd cognitive load and pt average decr'd to mid-upper 60s dB. This was partly due to pt making responses too long. SLP reiterated shorter responses and this assisted pt in increasing loudness average to upper 60s dB. Pt forgot to begin to chew gum for saliva management prior to session so he inserted gum 5-10 minutes into session and req'd occasional cues to swallow to minimize saliva in oral cavity. SLP explained that next session it would be better to insert gum prior to session.   11/21/22: Fred completed loud /a/ with upper 80s dB with limited loudness fade today. Everyday sentences were forgotten at home but pt recalled 6 of his10 sentences in the low 80s dB average. It appears as if pt is practicing /a/ and sentences at home.  SLP worked with pt with sentence responses with "3 clues" task however pt had difficulty with reasoning and processing with 75% of the stimuli - wife commented that she would not have guessed the ones that were more difficult for pt either. Because of this SLP provided more concrete stimuli for homework, yet tasks that would still work with pt's cognition. Pt witih questions if  cognition can be  improved - SLP suggested we would work to help cognition be maintained as long as possible with tasks like SLP was sending home. Total A with compensations for anterior labial leakage; suggested postural changes, and gum to diminish/lessen frequency of drooling.  11/19/22: Pt reports engaging with worksheets since last session for practicing louder speech.  Loud /a/ today with much improved procedure, obvious that pt has been practicing outside [place] sessions. Loudness average upper 80s dB with consistent loudness fade the last 1 second. Everyday sentences began at low-mid 80s average and ended with upper 70s dB average largely due to fatigue/decr'd attention. SLP engaged pt with sentence responses however pt expanded responses to 2-3 sentences and req'd SLP cues to shorten responses. Average loudness mid 60s dB. Pt with great difficulty with slightly more cognitive load. Cues consistently necessary for saliva management.   11/12/22: CES documented as above. Wife stated "I didn't want you to see any frustrations or negatives" - which leads SLP to believe if  her assessment is an accurate representation of pt's communication effectiveness at home. Pt began loud /a/ and incr'd pitch with each successive rep, with varying procedure. SLP ascertained pt has not been performing loud /a/ as frequently as directed. SLP explained rationale for loud /a/ and pt told SLP he will put forth more effort to accomplish loud /a/ at home, with everyday sentences, and work on the worksheets in order to carryover louder/WNL speech to conversational language/speech.  Pt with consistent hesitation and pausing in conversational speech today demonstrating notable bradyphrenia. Drooling compensations targeted throughout session - pt with minimal awareness.   11/06/22: CES provided and pt and wife to bring back next session. Drooling compensation with SLP cue x1 today during session. Loud /a/ produced with average low 80s  dB, cues necessary for maintaining loudness and pitch for entire production instead of loudness and pitch fade. Everyday sentences were appropriate and were produced with cues for  inhibiting loudness fade consistently. SLP provided pt with simple verbal expression homework for him 20 minutes daily.    11/04/22: Pt and wife to fill out CES next session.  Drooling compensations were covered with pt and wife today. Pt req'd cues x3 for "stop, gather, swallow hard twice". Loud /a/ req'd usual mod cues for loudness, steady pitch, and breath from abdomen. SLP then asked pt about 10 "everyday" sentences and pt generated one in ~4 minutes. SLP told pt and wife to "team up" to think of 7-8 more by putting list given today on refrigerator and write them down as they occur in the next 2-3 days and bring to next ST session.   10/16/22: Educated patient on possible impact of Parkinson's Disease (PD) on speech, swallowing, and cognition. Denied changes in swallowing and minimal changes in cognition (not primary concern at this time). Reported decline in conversational volume resulting in wife asking for repetition and clarification. Educated patient on ways to optimize vocal intensity, including utilizing principle of intent and using louder speaking volume. Pt was responsive to SLP education and instruction with pt able to optimize volume with cues and modeling for short utterances. Also recommended using intentional swallows to manage saliva more effectively as occasional minimal drooling noted. HEP initiated and provided today. Pt and wife verbalized understanding and agreement with SLP recs.   PATIENT EDUCATION: Education details: see "today's treatment" Person educated: Patient and Spouse Education method: Explanation, Demonstration, and Handouts Education comprehension: verbalized understanding, returned demonstration, and needs further education   GOALS: Goals reviewed with patient? Yes  SHORT TERM GOALS:  Target date: 11/14/2022 (extending to 11/29/22 due to visits)  Pt will produce loud /a/ with at least low 80s dB average over 2 sessions Baseline: 11/19/22 Goal status: met  2.  Pt will demo low 70s dB average in 8/10 sentence responses over 2 sessions Baseline:  Goal status: not met  3.  Pt will demo low 70s dB average in 3-5 minutes simple conversation given occasional min A over 2 sessions  Baseline:  Goal status: not met  4.  Pt will generate abdominal breathing 80% of the time when engaging in 5 minutes simple conversation over 2 sessions Baseline:  Goal status: not targeted due to focus on loudness in simple responses  5.  Pt will effectively manage saliva with volitional swallows for 3 or less episodes given occasional min A over 2 sessions Baseline: 11/28/22 Goal status: Partially met   LONG TERM GOALS: Target date: 01/09/23   Pt will complete homework (HEP) for at least  7 consecutive days Baseline:  Goal status: Ongoing  2.  Pt will demo WNL volume (low 70s dB average) in 8 minutes simple conversation with occasional min A over 2 sessions Baseline:  Goal status: Modified  3.  Pt will use abdominal breathing 70% of the time in 8 minutes simple conversation over 2 sessions  Baseline:  Goal status: Modified  4.  Pt will employ memory compensations (as needed) to effectively manage daily tasks given less frequent A from wife by last ST session Baseline:  Goal status: Ongoing  ASSESSMENT:  CLINICAL IMPRESSION: SLP modified/downgraded pt LTGs based upon pt's progress with STGs. Patient is a 87 y.o. M who was referred for hypophonia secondary to Parkinson's Disease. See "today's treatment" for more information. Drooling compensation of chewing gum appeared successful today. During eval, pt was responsive to SLP modeling and cues to increase volume to upper 60s dB for short structured phrases. Denied overt changes in swallowing or cognition (some minimal changes - not primary  concern at this time). Some rare throat clearing and drooling exhibited today. Pt would benefit from skilled ST intervention to optimize communication effectiveness, management of saliva, and possibly cognitive compensations to aid daily functioning.   OBJECTIVE IMPAIRMENTS: Objective impairments include memory, dysarthria, and Sialorrhea . These impairments are limiting patient from household responsibilities and effectively communicating at home and in community.Factors affecting potential to achieve goals and functional outcome are medical prognosis.. Patient will benefit from skilled SLP services to address above impairments and improve overall function.  REHAB POTENTIAL: Good  PLAN:  SLP FREQUENCY: 2x/week  SLP DURATION: 12 weeks  PLANNED INTERVENTIONS: Cueing hierachy, Cognitive reorganization, Internal/external aids, Functional tasks, Multimodal communication approach, SLP instruction and feedback, Compensatory strategies, and Patient/family education    [facility], [place] 11/28/2022, 5:32 PM

## 2022-12-02 ENCOUNTER — Ambulatory Visit: Payer: Medicare Other

## 2022-12-05 ENCOUNTER — Ambulatory Visit: Payer: Medicare Other

## 2022-12-05 DIAGNOSIS — R471 Dysarthria and anarthria: Secondary | ICD-10-CM | POA: Diagnosis not present

## 2022-12-05 DIAGNOSIS — R41841 Cognitive communication deficit: Secondary | ICD-10-CM

## 2022-12-05 NOTE — Therapy (Signed)
OUTPATIENT SPEECH LANGUAGE PATHOLOGY PARKINSON'S TREATMENT   Patient Name: [name] MRN: [id number] DOB:04/13/1936, 87 y.o., male Today's Date: 12/05/2022  PCP: Garlan FillersPaterson, Daniel G MD REFERRING PROVIDER: Huston FoleyAthar, Saima, MD  END OF SESSION:  End of Session - 12/05/22 1407     Visit Number 9    Number of Visits 25    Date for SLP Re-Evaluation 01/09/23    Authorization Type Medicare A & B    SLP Start Time 1402    SLP Stop Time  1445    SLP Time Calculation (min) 43 min    Activity Tolerance Patient tolerated treatment well                  Past Medical History:  Diagnosis Date   Ankylosing spondylitis dx'd ~ 1974   Arthritis    "back" (08/10/2015)   BENIGN PROSTATIC HYPERTROPHY, WITH OBSTRUCTION 01/15/2010   Bleeding duodenal ulcer    Bleeding esophageal ulcer    Bleeding stomach ulcer    GERD 02/22/2008   History of blood transfusion 1988   "lost ~ 1/2 of my blood volume from multiple bleeding ulcers"   History of hiatal hernia    HYPERGLYCEMIA 11/18/2007   HYPERLIPIDEMIA 02/22/2008   HYPERTENSION, UNSPECIFIED 11/10/2009   Kidney stones    Osteoarthritis    c-spine   PEPTIC ULCER DISEASE 11/17/2007   Situational depression    "son died in MVA 05/2015"   TIA (transient ischemic attack)    "not that I know of in the past; they are trying to determine if I've had one today (08/10/2015)"   Past Surgical History:  Procedure Laterality Date   CATARACT EXTRACTION W/ INTRAOCULAR LENS  IMPLANT, BILATERAL Bilateral 2013   Patient Active Problem List   Diagnosis Date Noted   Ankylosing spondylitis of cervicothoracic region 10/16/2016   TIA (transient ischemic attack) 08/10/2015   Carotid stenosis 08/04/2014   Hyperlipidemia 02/21/2012   BENIGN PROSTATIC HYPERTROPHY, WITH OBSTRUCTION 01/15/2010   Essential hypertension 11/10/2009   GERD 02/22/2008   NEPHROLITHIASIS, HX OF 11/17/2007    ONSET DATE: 10/02/22 (referral date); PD dx 4 years ago   REFERRING DIAG:   R49.8 (ICD-10-CM) - Hypophonia  G20.A2 (ICD-10-CM) - Parkinson's disease with fluctuating manifestations, unspecified whether dyskinesia present   THERAPY DIAG: Dysarthria and anarthria  Cognitive communication deficit  Rationale for Evaluation and Treatment: Rehabilitation  SUBJECTIVE:   SUBJECTIVE STATEMENT:  "I'm sorry I had to cancel on you on Monday."  Pt accompanied by: significant other  PERTINENT HISTORY: PD, complex medical history of peptic ulcer disease with history of upper GI bleed, TIA, depression, degenerative disc disease in the neck, osteoarthritis, kidney stones, hyperlipidemia, hypertension, history of hiatal hernia, BPH, arthritis, and ankylosing spondylitis, and parkinsonism. Referred by [facility] OP team for hypophonia. No prior ST for PD reported.   PAIN: Are you having pain? No   PATIENT GOALS: improve loudness   OBJECTIVE:    PATIENT REPORTED OUTCOME MEASURES (PROM): CES - provided 11/05/22 to wife and pt. [name] scored pt 16/40, pt scored himself 18/40 with higher scores as better effectiveness. [name] said talking on the phone was not at all effective with the rest as 2s and 3s. [name]'s answers were all 2s and 3s.  TODAY'S TREATMENT:  12/05/22:  Loud /a/ generated with visual cue for minimizing loudness decay with low 90s dB, with everyday sentences they were generated with low 80s dB for first 6-7 and then pt had loudness decay on the last 4. SLP attempted max cues for last 4 sentences repeatedly but pt cont'd loudness decay on these 4 sentences. SLP then abandoned further work on everyday sentences- visual cues would not work due to short duration, and pt could not/did not hear or feel volume or effort difference between beginnings and ends of sentences despite SLP max cues. SLP worked with pt with simple one-sentence responses  with more-WNL volume and pt benefited from min-mod A usually for combating loudness decay mostly due to cognitive load. SLP provided pt homework for simple sentence responses.   11/28/22: With gum today, pt had two drooling incidents over the course of the session, compared to 6-7 in previous sessions without using the gum.  Loud /a/ completed today average upper 80s- lower 90s dB. SLP used voice analyst program to improve pt's consistency with incr'd loudness, and combat pt's loudness fade. Everyday sentences with average low 80s dB, and pt remained largely at same volume as pt progressed through his list, unlike previous session. Sentence level responses (dual meaning words in which pt provided two separate sentences using the word in the sentence) were average upper 60s to lower 70s dB. Meaning of expressions was not as good as dual meaning words with average mid 60s dB. SLP explained to [name] how to modify tasks which may be too complex cognitively for pt.  11/26/22: Loud /a/ completed today average upper 80s dB (began in low 90s fading to lower-mid 80s dB) with consistent loudness fade after 4 seconds. Better after SLP usual verbal cues for consistent loudness for last two reps. Everyday sentences with average low 80s dB, but sentences got softer as pt progressed through his list. He req'd consistent cues for loudness by the last 3 sentences. SLP worked with pt on word level responses with his average low70s dB, so SLP made task more complex requiring two word answers with incr'd cognitive load and pt average decr'd to mid-upper 60s dB. This was partly due to pt making responses too long. SLP reiterated shorter responses and this assisted pt in increasing loudness average to upper 60s dB. Pt forgot to begin to chew gum for saliva management prior to session so he inserted gum 5-10 minutes into session and req'd occasional cues to swallow to minimize saliva in oral cavity. SLP explained that next session it  would be better to insert gum prior to session.   11/21/22: [name] completed loud /a/ with upper 80s dB with limited loudness fade today. Everyday sentences were forgotten at home but pt recalled 6 of his10 sentences in the low 80s dB average. It appears as if pt is practicing /a/ and sentences at home.  SLP worked with pt with sentence responses with "3 clues" task however pt had difficulty with reasoning and processing with 75% of the stimuli - wife commented that she would not have guessed the ones that were more difficult for pt either. Because of this SLP provided more concrete stimuli for homework, yet tasks that would still work with pt's cognition. Pt witih questions if cognition can be  improved - SLP suggested we would work to help cognition be maintained as long as possible with tasks like SLP was sending home. Total A with compensations for anterior labial leakage; suggested postural changes, and gum to  diminish/lessen frequency of drooling.  11/19/22: Pt reports engaging with worksheets since last session for practicing louder speech.  Loud /a/ today with much improved procedure, obvious that pt has been practicing outside ST sessions. Loudness average upper 80s dB with consistent loudness fade the last 1 second. Everyday sentences began at low-mid 80s average and ended with upper 70s dB average largely due to fatigue/decr'd attention. SLP engaged pt with sentence responses however pt expanded responses to 2-3 sentences and req'd SLP cues to shorten responses. Average loudness mid 60s dB. Pt with great difficulty with slightly more cognitive load. Cues consistently necessary for saliva management.   11/12/22: CES documented as above. Wife stated "I didn't want you to see any frustrations or negatives" - which leads SLP to believe if her assessment is an accurate representation of pt's communication effectiveness at home. Pt began loud /a/ and incr'd pitch with each successive rep, with varying  procedure. SLP ascertained pt has not been performing loud /a/ as frequently as directed. SLP explained rationale for loud /a/ and pt told SLP he will put forth more effort to accomplish loud /a/ at home, with everyday sentences, and work on the worksheets in order to carryover louder/WNL speech to conversational language/speech.  Pt with consistent hesitation and pausing in conversational speech today demonstrating notable bradyphrenia. Drooling compensations targeted throughout session - pt with minimal awareness.   11/06/22: CES provided and pt and wife to bring back next session. Drooling compensation with SLP cue x1 today during session. Loud /a/ produced with average low 80s dB, cues necessary for maintaining loudness and pitch for entire production instead of loudness and pitch fade. Everyday sentences were appropriate and were produced with cues for  inhibiting loudness fade consistently. SLP provided pt with simple verbal expression homework for him 20 minutes daily.    11/04/22: Pt and wife to fill out CES next session.  Drooling compensations were covered with pt and wife today. Pt req'd cues x3 for "stop, gather, swallow hard twice". Loud /a/ req'd usual mod cues for loudness, steady pitch, and breath from abdomen. SLP then asked pt about 10 "everyday" sentences and pt generated one in ~4 minutes. SLP told pt and wife to "team up" to think of 7-8 more by putting list given today on refrigerator and write them down as they occur in the next 2-3 days and bring to next ST session.   10/16/22: Educated patient on possible impact of Parkinson's Disease (PD) on speech, swallowing, and cognition. Denied changes in swallowing and minimal changes in cognition (not primary concern at this time). Reported decline in conversational volume resulting in wife asking for repetition and clarification. Educated patient on ways to optimize vocal intensity, including utilizing principle of intent and using louder  speaking volume. Pt was responsive to SLP education and instruction with pt able to optimize volume with cues and modeling for short utterances. Also recommended using intentional swallows to manage saliva more effectively as occasional minimal drooling noted. HEP initiated and provided today. Pt and wife verbalized understanding and agreement with SLP recs.   PATIENT EDUCATION: Education details: see "today's treatment" Person educated: Patient and Spouse Education method: Explanation, Demonstration, and Handouts Education comprehension: verbalized understanding, returned demonstration, and needs further education   GOALS: Goals reviewed with patient? Yes  SHORT TERM GOALS: Target date: 11/14/2022 (extending to 11/29/22 due to visits)  Pt will produce loud /a/ with at least low 80s dB average over 2 sessions Baseline: 11/19/22 Goal status: met  2.  Pt will demo low 70s dB average in 8/10 sentence responses over 2 sessions Baseline:  Goal status: not met  3.  Pt will demo low 70s dB average in 3-5 minutes simple conversation given occasional min A over 2 sessions  Baseline:  Goal status: not met  4.  Pt will generate abdominal breathing 80% of the time when engaging in 5 minutes simple conversation over 2 sessions Baseline:  Goal status: not targeted due to focus on loudness in simple responses  5.  Pt will effectively manage saliva with volitional swallows for 3 or less episodes given occasional min A over 2 sessions Baseline: 11/28/22 Goal status: Partially met   LONG TERM GOALS: Target date: 01/09/23   Pt will complete homework (HEP) for at least 7 consecutive days Baseline:  Goal status: Ongoing  2.  Pt will demo WNL volume (low 70s dB average) in 8 minutes simple conversation with occasional min A over 2 sessions Baseline:  Goal status: Modified  3.  Pt will use abdominal breathing 70% of the time in 8 minutes simple conversation over 2 sessions  Baseline:  Goal status:  Modified  4.  Pt will employ memory compensations (as needed) to effectively manage daily tasks given less frequent A from wife by last ST session Baseline:  Goal status: Ongoing  ASSESSMENT:  CLINICAL IMPRESSION: SLP modified/downgraded pt LTGs based upon pt's progress with STGs. Patient is a 87 y.o. M who was referred for hypophonia secondary to Parkinson's Disease. See "today's treatment" for more information. During eval, pt was responsive to SLP modeling and cues to increase volume to upper 60s dB for short structured phrases. Denied overt changes in swallowing or cognition (some minimal changes - not primary concern at this time). Some rare throat clearing and drooling exhibited today. Pt would benefit from skilled ST intervention to optimize communication effectiveness, management of saliva, and possibly cognitive compensations to aid daily functioning.   OBJECTIVE IMPAIRMENTS: Objective impairments include memory, dysarthria, and Sialorrhea . These impairments are limiting patient from household responsibilities and effectively communicating at home and in community.Factors affecting potential to achieve goals and functional outcome are medical prognosis.. Patient will benefit from skilled SLP services to address above impairments and improve overall function.  REHAB POTENTIAL: Good  PLAN:  SLP FREQUENCY: 2x/week  SLP DURATION: 12 weeks  PLANNED INTERVENTIONS: Cueing hierachy, Cognitive reorganization, Internal/external aids, Functional tasks, Multimodal communication approach, SLP instruction and feedback, Compensatory strategies, and Patient/family education    [facility], CCC-SLP 12/05/2022, 5:42 PM

## 2022-12-10 ENCOUNTER — Ambulatory Visit: Payer: Medicare Other

## 2022-12-10 DIAGNOSIS — R471 Dysarthria and anarthria: Secondary | ICD-10-CM | POA: Diagnosis not present

## 2022-12-10 DIAGNOSIS — R41841 Cognitive communication deficit: Secondary | ICD-10-CM | POA: Diagnosis not present

## 2022-12-10 DIAGNOSIS — S21212A Laceration without foreign body of left back wall of thorax without penetration into thoracic cavity, initial encounter: Secondary | ICD-10-CM | POA: Diagnosis not present

## 2022-12-10 DIAGNOSIS — S0101XA Laceration without foreign body of scalp, initial encounter: Secondary | ICD-10-CM | POA: Diagnosis not present

## 2022-12-10 DIAGNOSIS — L570 Actinic keratosis: Secondary | ICD-10-CM | POA: Diagnosis not present

## 2022-12-10 NOTE — Therapy (Signed)
OUTPATIENT SPEECH LANGUAGE PATHOLOGY PARKINSON'S TREATMENT/Progress note   Patient Name: [name] MRN: [id number] DOB:11-Dec-1935, 87 y.o., male Today's Date: 12/10/2022  PCP: Garlan Fillers MD REFERRING PROVIDER: Huston Foley, MD  END OF SESSION:  End of Session - 12/10/22 1405     Visit Number 10    Number of Visits 25    Date for SLP Re-Evaluation 01/09/23    Authorization Type Medicare A & B    SLP Start Time 1405    SLP Stop Time  1445    SLP Time Calculation (min) 40 min    Activity Tolerance Patient tolerated treatment well                  Past Medical History:  Diagnosis Date   Ankylosing spondylitis dx'd ~ 1974   Arthritis    "back" (08/10/2015)   BENIGN PROSTATIC HYPERTROPHY, WITH OBSTRUCTION 01/15/2010   Bleeding duodenal ulcer    Bleeding esophageal ulcer    Bleeding stomach ulcer    GERD 02/22/2008   History of blood transfusion 1988   "lost ~ 1/2 of my blood volume from multiple bleeding ulcers"   History of hiatal hernia    HYPERGLYCEMIA 11/18/2007   HYPERLIPIDEMIA 02/22/2008   HYPERTENSION, UNSPECIFIED 11/10/2009   Kidney stones    Osteoarthritis    c-spine   PEPTIC ULCER DISEASE 11/17/2007   Situational depression    "son died in MVA 17-Jun-2015"   TIA (transient ischemic attack)    "not that I know of in the past; they are trying to determine if I've had one today (08/10/2015)"   Past Surgical History:  Procedure Laterality Date   CATARACT EXTRACTION W/ INTRAOCULAR LENS  IMPLANT, BILATERAL Bilateral 2013   Patient Active Problem List   Diagnosis Date Noted   Ankylosing spondylitis of cervicothoracic region 10/16/2016   TIA (transient ischemic attack) 08/10/2015   Carotid stenosis 08/04/2014   Hyperlipidemia 02/21/2012   BENIGN PROSTATIC HYPERTROPHY, WITH OBSTRUCTION 01/15/2010   Essential hypertension 11/10/2009   GERD 02/22/2008   NEPHROLITHIASIS, HX OF 11/17/2007   Speech Therapy Progress Note  Dates of Reporting Period:  11/04/22 to present  Subjective Statement: Pt has been seen for 10 sessions targeting drool cessation and speech intelligibility  Objective: See below  Goal Update: See below  Plan: Cont to see pt for approx 8 more sessions  Reason Skilled Services are Required: Pt has not reached full rehab potential    ONSET DATE: 10/02/22 (referral date); PD dx 4 years ago   REFERRING DIAG:  R49.8 (ICD-10-CM) - Hypophonia  G20.A2 (ICD-10-CM) - Parkinson's disease with fluctuating manifestations, unspecified whether dyskinesia present   THERAPY DIAG: Cognitive communication deficit  Dysarthria and anarthria  Rationale for Evaluation and Treatment: Rehabilitation  SUBJECTIVE:   SUBJECTIVE STATEMENT:  "I can give you five now if you want 'em (loud /a/)."  Pt accompanied by: significant other  PERTINENT HISTORY: PD, complex medical history of peptic ulcer disease with history of upper GI bleed, TIA, depression, degenerative disc disease in the neck, osteoarthritis, kidney stones, hyperlipidemia, hypertension, history of hiatal hernia, BPH, arthritis, and ankylosing spondylitis, and parkinsonism. Referred by [facility] OP team for hypophonia. No prior ST for PD reported.   PAIN: Are you having pain? No   PATIENT GOALS: improve loudness   OBJECTIVE:    PATIENT REPORTED OUTCOME MEASURES (PROM): CES - provided 11/05/22 to wife and pt. [name] scored pt 16/40, pt scored himself 18/40 with higher scores as better effectiveness. [name] said talking  on the phone was not at all effective with the rest as 2s and 3s. [name]'s answers were all 2s and 3s.  TODAY'S TREATMENT:                                                                                                                                         12/10/22: Loud /a/ generated with low 90s-upper 80s dB, with everyday sentences generated with low 80s dB independently. SLP engaged pt with word problems which pt had to solve (simple) and maintain  louder, energized speech. Pt did this with 25% success on first attempt, and incr'd to 80% with second attempt. SLP told pt to practice these with wife as he and wife report he has had more challenge with working memory since fall in the autumn.  12/05/22:  Loud /a/ generated with visual cue for minimizing loudness decay with low 90s dB, with everyday sentences they were generated with low 80s dB for first 6-7 and then pt had loudness decay on the last 4. SLP attempted max cues for last 4 sentences repeatedly but pt cont'd loudness decay on these 4 sentences. SLP then abandoned further work on everyday sentences- visual cues would not work due to short duration, and pt could not/did not hear or feel volume or effort difference between beginnings and ends of sentences despite SLP max cues. SLP worked with pt with simple one-sentence responses with more-WNL volume and pt benefited from min-mod A usually for combating loudness decay mostly due to cognitive load. SLP provided pt homework for simple sentence responses.   11/28/22: With gum today, pt had two drooling incidents over the course of the session, compared to 6-7 in previous sessions without using the gum.  Loud /a/ completed today average upper 80s- lower 90s dB. SLP used voice analyst program to improve pt's consistency with incr'd loudness, and combat pt's loudness fade. Everyday sentences with average low 80s dB, and pt remained largely at same volume as pt progressed through his list, unlike previous session. Sentence level responses (dual meaning words in which pt provided two separate sentences using the word in the sentence) were average upper 60s to lower 70s dB. Meaning of expressions was not as good as dual meaning words with average mid 60s dB. SLP explained to [name] how to modify tasks which may be too complex cognitively for pt.  11/26/22: Loud /a/ completed today average upper 80s dB (began in low 90s fading to lower-mid 80s dB) with consistent  loudness fade after 4 seconds. Better after SLP usual verbal cues for consistent loudness for last two reps. Everyday sentences with average low 80s dB, but sentences got softer as pt progressed through his list. He req'd consistent cues for loudness by the last 3 sentences. SLP worked with pt on word level responses with his average low70s dB, so SLP made task more complex requiring two word answers with  incr'd cognitive load and pt average decr'd to mid-upper 60s dB. This was partly due to pt making responses too long. SLP reiterated shorter responses and this assisted pt in increasing loudness average to upper 60s dB. Pt forgot to begin to chew gum for saliva management prior to session so he inserted gum 5-10 minutes into session and req'd occasional cues to swallow to minimize saliva in oral cavity. SLP explained that next session it would be better to insert gum prior to session.   11/21/22: Fred completed loud /a/ with upper 80s dB with limited loudness fade today. Everyday sentences were forgotten at home but pt recalled 6 of his10 sentences in the low 80s dB average. It appears as if pt is practicing /a/ and sentences at home.  SLP worked with pt with sentence responses with "3 clues" task however pt had difficulty with reasoning and processing with 75% of the stimuli - wife commented that she would not have guessed the ones that were more difficult for pt either. Because of this SLP provided more concrete stimuli for homework, yet tasks that would still work with pt's cognition. Pt witih questions if cognition can be  improved - SLP suggested we would work to help cognition be maintained as long as possible with tasks like SLP was sending home. Total A with compensations for anterior labial leakage; suggested postural changes, and gum to diminish/lessen frequency of drooling.  11/19/22: Pt reports engaging with worksheets since last session for practicing louder speech.  Loud /a/ today with much  improved procedure, obvious that pt has been practicing outside ST sessions. Loudness average upper 80s dB with consistent loudness fade the last 1 second. Everyday sentences began at low-mid 80s average and ended with upper 70s dB average largely due to fatigue/decr'd attention. SLP engaged pt with sentence responses however pt expanded responses to 2-3 sentences and req'd SLP cues to shorten responses. Average loudness mid 60s dB. Pt with great difficulty with slightly more cognitive load. Cues consistently necessary for saliva management.   11/12/22: CES documented as above. Wife stated "I didn't want you to see any frustrations or negatives" - which leads SLP to believe if her assessment is an accurate representation of pt's communication effectiveness at home. Pt began loud /a/ and incr'd pitch with each successive rep, with varying procedure. SLP ascertained pt has not been performing loud /a/ as frequently as directed. SLP explained rationale for loud /a/ and pt told SLP he will put forth more effort to accomplish loud /a/ at home, with everyday sentences, and work on the worksheets in order to carryover louder/WNL speech to conversational language/speech.  Pt with consistent hesitation and pausing in conversational speech today demonstrating notable bradyphrenia. Drooling compensations targeted throughout session - pt with minimal awareness.   11/06/22: CES provided and pt and wife to bring back next session. Drooling compensation with SLP cue x1 today during session. Loud /a/ produced with average low 80s dB, cues necessary for maintaining loudness and pitch for entire production instead of loudness and pitch fade. Everyday sentences were appropriate and were produced with cues for  inhibiting loudness fade consistently. SLP provided pt with simple verbal expression homework for him 20 minutes daily.    11/04/22: Pt and wife to fill out CES next session.  Drooling compensations were covered with pt  and wife today. Pt req'd cues x3 for "stop, gather, swallow hard twice". Loud /a/ req'd usual mod cues for loudness, steady pitch, and breath from abdomen. SLP then  asked pt about 10 "everyday" sentences and pt generated one in ~4 minutes. SLP told pt and wife to "team up" to think of 7-8 more by putting list given today on refrigerator and write them down as they occur in the next 2-3 days and bring to next ST session.   10/16/22: Educated patient on possible impact of Parkinson's Disease (PD) on speech, swallowing, and cognition. Denied changes in swallowing and minimal changes in cognition (not primary concern at this time). Reported decline in conversational volume resulting in wife asking for repetition and clarification. Educated patient on ways to optimize vocal intensity, including utilizing principle of intent and using louder speaking volume. Pt was responsive to SLP education and instruction with pt able to optimize volume with cues and modeling for short utterances. Also recommended using intentional swallows to manage saliva more effectively as occasional minimal drooling noted. HEP initiated and provided today. Pt and wife verbalized understanding and agreement with SLP recs.   PATIENT EDUCATION: Education details: see "today's treatment" Person educated: Patient and Spouse Education method: Explanation, Demonstration, and Handouts Education comprehension: verbalized understanding, returned demonstration, and needs further education   GOALS: Goals reviewed with patient? Yes  SHORT TERM GOALS: Target date: 11/14/2022 (extending to 11/29/22 due to visits)  Pt will produce loud /a/ with at least low 80s dB average over 2 sessions Baseline: 11/19/22 Goal status: met  2.  Pt will demo low 70s dB average in 8/10 sentence responses over 2 sessions Baseline:  Goal status: not met  3.  Pt will demo low 70s dB average in 3-5 minutes simple conversation given occasional min A over 2 sessions   Baseline:  Goal status: not met  4.  Pt will generate abdominal breathing 80% of the time when engaging in 5 minutes simple conversation over 2 sessions Baseline:  Goal status: not targeted due to focus on loudness in simple responses  5.  Pt will effectively manage saliva with volitional swallows for 3 or less episodes given occasional min A over 2 sessions Baseline: 11/28/22 Goal status: Partially met   LONG TERM GOALS: Target date: 01/09/23   Pt will complete homework (HEP) for at least 7 consecutive days Baseline:  Goal status: Met  2.  Pt will demo WNL volume (low 70s dB average) in 8 minutes simple conversation with occasional min A over 2 sessions Baseline:  Goal status: Modified  3.  Pt will use abdominal breathing 70% of the time in 8 minutes simple conversation over 2 sessions  Baseline:  Goal status: Modified  4.  Pt will employ memory compensations (as needed) to effectively manage daily tasks given less frequent A from wife by last ST session Baseline:  Goal status: Ongoing  ASSESSMENT:  CLINICAL IMPRESSION: Patient is a 87 y.o. M who was referred for hypophonia secondary to Parkinson's Disease. See "today's treatment" for more information. During eval, pt was responsive to SLP modeling and cues to increase volume to upper 60s dB for short structured phrases. Denied overt changes in swallowing or cognition (some minimal changes - not primary concern at this time). Some rare throat clearing and drooling exhibited today. Pt would benefit from skilled ST intervention to optimize communication effectiveness, management of saliva, and possibly cognitive compensations to aid daily functioning.   OBJECTIVE IMPAIRMENTS: Objective impairments include memory, dysarthria, and Sialorrhea . These impairments are limiting patient from household responsibilities and effectively communicating at home and in community.Factors affecting potential to achieve goals and functional outcome  are medical prognosis.Marland Kitchen  Patient will benefit from skilled SLP services to address above impairments and improve overall function.  REHAB POTENTIAL: Good  PLAN:  SLP FREQUENCY: 2x/week  SLP DURATION: 12 weeks  PLANNED INTERVENTIONS: Cueing hierachy, Cognitive reorganization, Internal/external aids, Functional tasks, Multimodal communication approach, SLP instruction and feedback, Compensatory strategies, and Patient/family education    [facility], CCC-SLP 12/10/2022, 2:05 PM

## 2022-12-12 ENCOUNTER — Ambulatory Visit: Payer: Medicare Other

## 2022-12-12 DIAGNOSIS — R471 Dysarthria and anarthria: Secondary | ICD-10-CM

## 2022-12-12 DIAGNOSIS — R41841 Cognitive communication deficit: Secondary | ICD-10-CM | POA: Diagnosis not present

## 2022-12-12 NOTE — Therapy (Signed)
OUTPATIENT SPEECH LANGUAGE PATHOLOGY PARKINSON'S TREATMENT/Progress note   Patient Name: [name] MRN: [id number] DOB:May 03, 1936, 87 y.o., male Today's Date: 12/12/2022  PCP: Garlan Fillers MD REFERRING PROVIDER: Huston Foley, MD  END OF SESSION:  End of Session - 12/12/22 1454     Visit Number 11    Number of Visits 25    Date for SLP Re-Evaluation 01/09/23    Authorization Type Medicare A & B    SLP Start Time 1451    SLP Stop Time  1531    SLP Time Calculation (min) 40 min    Activity Tolerance Patient tolerated treatment well                  Past Medical History:  Diagnosis Date   Ankylosing spondylitis dx'd ~ 1974   Arthritis    "back" (08/10/2015)   BENIGN PROSTATIC HYPERTROPHY, WITH OBSTRUCTION 01/15/2010   Bleeding duodenal ulcer    Bleeding esophageal ulcer    Bleeding stomach ulcer    GERD 02/22/2008   History of blood transfusion 1988   "lost ~ 1/2 of my blood volume from multiple bleeding ulcers"   History of hiatal hernia    HYPERGLYCEMIA 11/18/2007   HYPERLIPIDEMIA 02/22/2008   HYPERTENSION, UNSPECIFIED 11/10/2009   Kidney stones    Osteoarthritis    c-spine   PEPTIC ULCER DISEASE 11/17/2007   Situational depression    "son died in MVA Jun 17, 2015"   TIA (transient ischemic attack)    "not that I know of in the past; they are trying to determine if I've had one today (08/10/2015)"   Past Surgical History:  Procedure Laterality Date   CATARACT EXTRACTION W/ INTRAOCULAR LENS  IMPLANT, BILATERAL Bilateral 2013   Patient Active Problem List   Diagnosis Date Noted   Ankylosing spondylitis of cervicothoracic region 10/16/2016   TIA (transient ischemic attack) 08/10/2015   Carotid stenosis 08/04/2014   Hyperlipidemia 02/21/2012   BENIGN PROSTATIC HYPERTROPHY, WITH OBSTRUCTION 01/15/2010   Essential hypertension 11/10/2009   GERD 02/22/2008   NEPHROLITHIASIS, HX OF 11/17/2007   Speech Therapy Progress Note  Dates of Reporting Period:  11/04/22 to present  Subjective Statement: Pt has been seen for 10 sessions targeting drool cessation and speech intelligibility  Objective: See below  Goal Update: See below  Plan: Cont to see pt for approx 8 more sessions  Reason Skilled Services are Required: Pt has not reached full rehab potential    ONSET DATE: 10/02/22 (referral date); PD dx 4 years ago   REFERRING DIAG:  R49.8 (ICD-10-CM) - Hypophonia  G20.A2 (ICD-10-CM) - Parkinson's disease with fluctuating manifestations, unspecified whether dyskinesia present   THERAPY DIAG: Dysarthria and anarthria  Cognitive communication deficit  Rationale for Evaluation and Treatment: Rehabilitation  SUBJECTIVE:   SUBJECTIVE STATEMENT:  "I can give you five now if you want 'em (loud /a/)."  Pt accompanied by: significant other  PERTINENT HISTORY: PD, complex medical history of peptic ulcer disease with history of upper GI bleed, TIA, depression, degenerative disc disease in the neck, osteoarthritis, kidney stones, hyperlipidemia, hypertension, history of hiatal hernia, BPH, arthritis, and ankylosing spondylitis, and parkinsonism. Referred by [facility] OP team for hypophonia. No prior ST for PD reported.   PAIN: Are you having pain? No   PATIENT GOALS: improve loudness   OBJECTIVE:    PATIENT REPORTED OUTCOME MEASURES (PROM): CES - provided 11/05/22 to wife and pt. [name] scored pt 16/40, pt scored himself 18/40 with higher scores as better effectiveness. [name] said talking  on the phone was not at all effective with the rest as 2s and 3s. [name]'s answers were all 2s and 3s.  TODAY'S TREATMENT:                                                                                                                                         12/12/22: Loud /a/ generated with low 90s-upper 80s dB, with consistent cues for pt to lower pitch into speaking range. These cues were largely unsuccessful even with online piano played at speaking Hz.  [name] stated his everyday sentences with low 80s dB independently.  In short conversational tasks of <5 minutes pt maintained volume of mid-upper 60s dB, successful for first 3 segments but after this pt demonstrated mental fatigue and incr'd cognitive load re: memories - speech averaged mid 60s at this point. Encouraged pt/wife to cont to have conversations like this at home in order for pt to practice, possibly with daughter. Pt may bring in photos next session for conversation.  12/10/22: Loud /a/ generated with low 90s-upper 80s dB, with everyday sentences generated with low 80s dB independently. SLP engaged pt with word problems which pt had to solve (simple) and maintain louder, energized speech. Pt did this with 25% success on first attempt, and incr'd to 80% with second attempt. SLP told pt to practice these with wife as he and wife report he has had more challenge with working memory since fall in the autumn.  12/05/22:  Loud /a/ generated with visual cue for minimizing loudness decay with low 90s dB, with everyday sentences they were generated with low 80s dB for first 6-7 and then pt had loudness decay on the last 4. SLP attempted max cues for last 4 sentences repeatedly but pt cont'd loudness decay on these 4 sentences. SLP then abandoned further work on everyday sentences- visual cues would not work due to short duration, and pt could not/did not hear or feel volume or effort difference between beginnings and ends of sentences despite SLP max cues. SLP worked with pt with simple one-sentence responses with more-WNL volume and pt benefited from min-mod A usually for combating loudness decay mostly due to cognitive load. SLP provided pt homework for simple sentence responses.   11/28/22: With gum today, pt had two drooling incidents over the course of the session, compared to 6-7 in previous sessions without using the gum.  Loud /a/ completed today average upper 80s- lower 90s dB. SLP used voice  analyst program to improve pt's consistency with incr'd loudness, and combat pt's loudness fade. Everyday sentences with average low 80s dB, and pt remained largely at same volume as pt progressed through his list, unlike previous session. Sentence level responses (dual meaning words in which pt provided two separate sentences using the word in the sentence) were average upper 60s to lower 70s dB. Meaning of expressions was not as good as dual  meaning words with average mid 60s dB. SLP explained to [name] how to modify tasks which may be too complex cognitively for pt.  11/26/22: Loud /a/ completed today average upper 80s dB (began in low 90s fading to lower-mid 80s dB) with consistent loudness fade after 4 seconds. Better after SLP usual verbal cues for consistent loudness for last two reps. Everyday sentences with average low 80s dB, but sentences got softer as pt progressed through his list. He req'd consistent cues for loudness by the last 3 sentences. SLP worked with pt on word level responses with his average low70s dB, so SLP made task more complex requiring two word answers with incr'd cognitive load and pt average decr'd to mid-upper 60s dB. This was partly due to pt making responses too long. SLP reiterated shorter responses and this assisted pt in increasing loudness average to upper 60s dB. Pt forgot to begin to chew gum for saliva management prior to session so he inserted gum 5-10 minutes into session and req'd occasional cues to swallow to minimize saliva in oral cavity. SLP explained that next session it would be better to insert gum prior to session.   11/21/22: Fred completed loud /a/ with upper 80s dB with limited loudness fade today. Everyday sentences were forgotten at home but pt recalled 6 of his10 sentences in the low 80s dB average. It appears as if pt is practicing /a/ and sentences at home.  SLP worked with pt with sentence responses with "3 clues" task however pt had difficulty with  reasoning and processing with 75% of the stimuli - wife commented that she would not have guessed the ones that were more difficult for pt either. Because of this SLP provided more concrete stimuli for homework, yet tasks that would still work with pt's cognition. Pt witih questions if cognition can be  improved - SLP suggested we would work to help cognition be maintained as long as possible with tasks like SLP was sending home. Total A with compensations for anterior labial leakage; suggested postural changes, and gum to diminish/lessen frequency of drooling.  11/19/22: Pt reports engaging with worksheets since last session for practicing louder speech.  Loud /a/ today with much improved procedure, obvious that pt has been practicing outside ST sessions. Loudness average upper 80s dB with consistent loudness fade the last 1 second. Everyday sentences began at low-mid 80s average and ended with upper 70s dB average largely due to fatigue/decr'd attention. SLP engaged pt with sentence responses however pt expanded responses to 2-3 sentences and req'd SLP cues to shorten responses. Average loudness mid 60s dB. Pt with great difficulty with slightly more cognitive load. Cues consistently necessary for saliva management.   11/12/22: CES documented as above. Wife stated "I didn't want you to see any frustrations or negatives" - which leads SLP to believe if her assessment is an accurate representation of pt's communication effectiveness at home. Pt began loud /a/ and incr'd pitch with each successive rep, with varying procedure. SLP ascertained pt has not been performing loud /a/ as frequently as directed. SLP explained rationale for loud /a/ and pt told SLP he will put forth more effort to accomplish loud /a/ at home, with everyday sentences, and work on the worksheets in order to carryover louder/WNL speech to conversational language/speech.  Pt with consistent hesitation and pausing in conversational speech today  demonstrating notable bradyphrenia. Drooling compensations targeted throughout session - pt with minimal awareness.   11/06/22: CES provided and pt and wife to  bring back next session. Drooling compensation with SLP cue x1 today during session. Loud /a/ produced with average low 80s dB, cues necessary for maintaining loudness and pitch for entire production instead of loudness and pitch fade. Everyday sentences were appropriate and were produced with cues for  inhibiting loudness fade consistently. SLP provided pt with simple verbal expression homework for him 20 minutes daily.    11/04/22: Pt and wife to fill out CES next session.  Drooling compensations were covered with pt and wife today. Pt req'd cues x3 for "stop, gather, swallow hard twice". Loud /a/ req'd usual mod cues for loudness, steady pitch, and breath from abdomen. SLP then asked pt about 10 "everyday" sentences and pt generated one in ~4 minutes. SLP told pt and wife to "team up" to think of 7-8 more by putting list given today on refrigerator and write them down as they occur in the next 2-3 days and bring to next ST session.   10/16/22: Educated patient on possible impact of Parkinson's Disease (PD) on speech, swallowing, and cognition. Denied changes in swallowing and minimal changes in cognition (not primary concern at this time). Reported decline in conversational volume resulting in wife asking for repetition and clarification. Educated patient on ways to optimize vocal intensity, including utilizing principle of intent and using louder speaking volume. Pt was responsive to SLP education and instruction with pt able to optimize volume with cues and modeling for short utterances. Also recommended using intentional swallows to manage saliva more effectively as occasional minimal drooling noted. HEP initiated and provided today. Pt and wife verbalized understanding and agreement with SLP recs.   PATIENT EDUCATION: Education details: see  "today's treatment" Person educated: Patient and Spouse Education method: Explanation, Demonstration, and Handouts Education comprehension: verbalized understanding, returned demonstration, and needs further education   GOALS: Goals reviewed with patient? Yes  SHORT TERM GOALS: Target date: 11/14/2022 (extending to 11/29/22 due to visits)  Pt will produce loud /a/ with at least low 80s dB average over 2 sessions Baseline: 11/19/22 Goal status: met  2.  Pt will demo low 70s dB average in 8/10 sentence responses over 2 sessions Baseline:  Goal status: not met  3.  Pt will demo low 70s dB average in 3-5 minutes simple conversation given occasional min A over 2 sessions  Baseline:  Goal status: not met  4.  Pt will generate abdominal breathing 80% of the time when engaging in 5 minutes simple conversation over 2 sessions Baseline:  Goal status: not targeted due to focus on loudness in simple responses  5.  Pt will effectively manage saliva with volitional swallows for 3 or less episodes given occasional min A over 2 sessions Baseline: 11/28/22 Goal status: Partially met   LONG TERM GOALS: Target date: 01/09/23   Pt will complete homework (HEP) for at least 7 consecutive days Baseline:  Goal status: Met  2.  Pt will demo WNL volume (low 70s dB average) in 8 minutes simple conversation with occasional min A over 2 sessions Baseline:  Goal status: Modified  3.  Pt will use abdominal breathing 70% of the time in 8 minutes simple conversation over 2 sessions  Baseline:  Goal status: Modified  4.  Pt will employ memory compensations (as needed) to effectively manage daily tasks given less frequent A from wife by last ST session Baseline:  Goal status: Ongoing  ASSESSMENT:  CLINICAL IMPRESSION: Patient is a 87 y.o. M who was referred for hypophonia secondary to Parkinson's Disease.  See "today's treatment" for more information. During eval, pt was responsive to SLP modeling and  cues to increase volume to upper 60s dB for short structured phrases. Denied overt changes in swallowing or cognition (some minimal changes - not primary concern at this time). Some rare throat clearing and drooling exhibited today. Pt would benefit from skilled ST intervention to optimize communication effectiveness, management of saliva, and possibly cognitive compensations to aid daily functioning.   OBJECTIVE IMPAIRMENTS: Objective impairments include memory, dysarthria, and Sialorrhea . These impairments are limiting patient from household responsibilities and effectively communicating at home and in community.Factors affecting potential to achieve goals and functional outcome are medical prognosis.. Patient will benefit from skilled SLP services to address above impairments and improve overall function.  REHAB POTENTIAL: Good  PLAN:  SLP FREQUENCY: 2x/week  SLP DURATION: 12 weeks  PLANNED INTERVENTIONS: Cueing hierachy, Cognitive reorganization, Internal/external aids, Functional tasks, Multimodal communication approach, SLP instruction and feedback, Compensatory strategies, and Patient/family education    [facility], CCC-SLP 12/12/2022, 2:55 PM

## 2022-12-17 ENCOUNTER — Ambulatory Visit: Payer: Medicare Other

## 2022-12-17 DIAGNOSIS — R41841 Cognitive communication deficit: Secondary | ICD-10-CM

## 2022-12-17 DIAGNOSIS — R471 Dysarthria and anarthria: Secondary | ICD-10-CM | POA: Diagnosis not present

## 2022-12-17 NOTE — Therapy (Signed)
OUTPATIENT SPEECH LANGUAGE PATHOLOGY PARKINSON'S TREATMENT   Patient Name: [name] MRN: [id number] DOB:1935/11/17, 87 y.o., male Today's Date: 12/17/2022  PCP: Garlan Fillers MD REFERRING PROVIDER: Huston Foley, MD  END OF SESSION:  End of Session - 12/17/22 1409     Visit Number 12    Number of Visits 25    Date for SLP Re-Evaluation 01/09/23    Authorization Type Medicare A & B    SLP Start Time 1406    SLP Stop Time  1447    SLP Time Calculation (min) 41 min    Activity Tolerance Patient tolerated treatment well                  Past Medical History:  Diagnosis Date   Ankylosing spondylitis dx'd ~ 1974   Arthritis    "back" (08/10/2015)   BENIGN PROSTATIC HYPERTROPHY, WITH OBSTRUCTION 01/15/2010   Bleeding duodenal ulcer    Bleeding esophageal ulcer    Bleeding stomach ulcer    GERD 02/22/2008   History of blood transfusion 1988   "lost ~ 1/2 of my blood volume from multiple bleeding ulcers"   History of hiatal hernia    HYPERGLYCEMIA 11/18/2007   HYPERLIPIDEMIA 02/22/2008   HYPERTENSION, UNSPECIFIED 11/10/2009   Kidney stones    Osteoarthritis    c-spine   PEPTIC ULCER DISEASE 11/17/2007   Situational depression    "son died in MVA 07-Jun-2015"   TIA (transient ischemic attack)    "not that I know of in the past; they are trying to determine if I've had one today (08/10/2015)"   Past Surgical History:  Procedure Laterality Date   CATARACT EXTRACTION W/ INTRAOCULAR LENS  IMPLANT, BILATERAL Bilateral 2013   Patient Active Problem List   Diagnosis Date Noted   Ankylosing spondylitis of cervicothoracic region 10/16/2016   TIA (transient ischemic attack) 08/10/2015   Carotid stenosis 08/04/2014   Hyperlipidemia 02/21/2012   BENIGN PROSTATIC HYPERTROPHY, WITH OBSTRUCTION 01/15/2010   Essential hypertension 11/10/2009   GERD 02/22/2008   NEPHROLITHIASIS, HX OF 11/17/2007     ONSET DATE: 10/02/22 (referral date); PD dx 4 years ago   REFERRING  DIAG:  R49.8 (ICD-10-CM) - Hypophonia  G20.A2 (ICD-10-CM) - Parkinson's disease with fluctuating manifestations, unspecified whether dyskinesia present   THERAPY DIAG: Dysarthria and anarthria  Cognitive communication deficit  Rationale for Evaluation and Treatment: Rehabilitation  SUBJECTIVE:   SUBJECTIVE STATEMENT:  (Pt, re: calculation homework "That was distressing to me that the - - numeric computations were difficult." [name] described a job he had as a teen working at a [employer] and made change and figured math in his head.   Pt accompanied by: significant other   PERTINENT HISTORY: PD, complex medical history of peptic ulcer disease with history of upper GI bleed, TIA, depression, degenerative disc disease in the neck, osteoarthritis, kidney stones, hyperlipidemia, hypertension, history of hiatal hernia, BPH, arthritis, and ankylosing spondylitis, and parkinsonism. Referred by [facility] OP team for hypophonia. No prior ST for PD reported.   PAIN: Are you having pain? No   PATIENT GOALS: improve loudness   OBJECTIVE:    PATIENT REPORTED OUTCOME MEASURES (PROM): CES - provided 11/05/22 to wife and pt. [name] scored pt 16/40, pt scored himself 18/40 with higher scores as better effectiveness. [name] said talking on the phone was not at all effective with the rest as 2s and 3s. [name]'s answers were all 2s and 3s.  TODAY'S TREATMENT:  12/17/22: Loud /a/ produced in upper 80s - low 90s dB with rare cues for loudness fade. SLP worked on pt's loudness in functional math problems. Pt req'd mod-max A consistently for redirection of sustanied attention to solve simple 3-step functional math problem. SLP attempted to simplify the problem however pt had same difficulties with attention. SLP unsure if this skill will improve tremendously given pt's Parkinson's,  so SLP suggested pt use calculator for math if necessary.   12/12/22: Loud /a/ generated with low 90s-upper 80s dB, with consistent cues for pt to lower pitch into speaking range. These cues were largely unsuccessful even with online piano played at speaking Hz. [name] stated his everyday sentences with low 80s dB independently.  In short conversational tasks of <5 minutes pt maintained volume of mid-upper 60s dB, successful for first 3 segments but after this pt demonstrated mental fatigue and incr'd cognitive load re: memories - speech averaged mid 60s at this point. Encouraged pt/wife to cont to have conversations like this at home in order for pt to practice, possibly with daughter. Pt may bring in photos next session for conversation.  12/10/22: Loud /a/ generated with low 90s-upper 80s dB, with everyday sentences generated with low 80s dB independently. SLP engaged pt with word problems which pt had to solve (simple) and maintain louder, energized speech. Pt did this with 25% success on first attempt, and incr'd to 80% with second attempt. SLP told pt to practice these with wife as he and wife report he has had more challenge with working memory since fall in the autumn.  12/05/22:  Loud /a/ generated with visual cue for minimizing loudness decay with low 90s dB, with everyday sentences they were generated with low 80s dB for first 6-7 and then pt had loudness decay on the last 4. SLP attempted max cues for last 4 sentences repeatedly but pt cont'd loudness decay on these 4 sentences. SLP then abandoned further work on everyday sentences- visual cues would not work due to short duration, and pt could not/did not hear or feel volume or effort difference between beginnings and ends of sentences despite SLP max cues. SLP worked with pt with simple one-sentence responses with more-WNL volume and pt benefited from min-mod A usually for combating loudness decay mostly due to cognitive load. SLP provided pt  homework for simple sentence responses.   11/28/22: With gum today, pt had two drooling incidents over the course of the session, compared to 6-7 in previous sessions without using the gum.  Loud /a/ completed today average upper 80s- lower 90s dB. SLP used voice analyst program to improve pt's consistency with incr'd loudness, and combat pt's loudness fade. Everyday sentences with average low 80s dB, and pt remained largely at same volume as pt progressed through his list, unlike previous session. Sentence level responses (dual meaning words in which pt provided two separate sentences using the word in the sentence) were average upper 60s to lower 70s dB. Meaning of expressions was not as good as dual meaning words with average mid 60s dB. SLP explained to [name] how to modify tasks which may be too complex cognitively for pt.  11/26/22: Loud /a/ completed today average upper 80s dB (began in low 90s fading to lower-mid 80s dB) with consistent loudness fade after 4 seconds. Better after SLP usual verbal cues for consistent loudness for last two reps. Everyday sentences with average low 80s dB, but sentences got softer as pt progressed through his list. He req'd consistent  cues for loudness by the last 3 sentences. SLP worked with pt on word level responses with his average low70s dB, so SLP made task more complex requiring two word answers with incr'd cognitive load and pt average decr'd to mid-upper 60s dB. This was partly due to pt making responses too long. SLP reiterated shorter responses and this assisted pt in increasing loudness average to upper 60s dB. Pt forgot to begin to chew gum for saliva management prior to session so he inserted gum 5-10 minutes into session and req'd occasional cues to swallow to minimize saliva in oral cavity. SLP explained that next session it would be better to insert gum prior to session.   11/21/22: Fred completed loud /a/ with upper 80s dB with limited loudness fade today.  Everyday sentences were forgotten at home but pt recalled 6 of his10 sentences in the low 80s dB average. It appears as if pt is practicing /a/ and sentences at home.  SLP worked with pt with sentence responses with "3 clues" task however pt had difficulty with reasoning and processing with 75% of the stimuli - wife commented that she would not have guessed the ones that were more difficult for pt either. Because of this SLP provided more concrete stimuli for homework, yet tasks that would still work with pt's cognition. Pt witih questions if cognition can be  improved - SLP suggested we would work to help cognition be maintained as long as possible with tasks like SLP was sending home. Total A with compensations for anterior labial leakage; suggested postural changes, and gum to diminish/lessen frequency of drooling.  11/19/22: Pt reports engaging with worksheets since last session for practicing louder speech.  Loud /a/ today with much improved procedure, obvious that pt has been practicing outside ST sessions. Loudness average upper 80s dB with consistent loudness fade the last 1 second. Everyday sentences began at low-mid 80s average and ended with upper 70s dB average largely due to fatigue/decr'd attention. SLP engaged pt with sentence responses however pt expanded responses to 2-3 sentences and req'd SLP cues to shorten responses. Average loudness mid 60s dB. Pt with great difficulty with slightly more cognitive load. Cues consistently necessary for saliva management.   11/12/22: CES documented as above. Wife stated "I didn't want you to see any frustrations or negatives" - which leads SLP to believe if her assessment is an accurate representation of pt's communication effectiveness at home. Pt began loud /a/ and incr'd pitch with each successive rep, with varying procedure. SLP ascertained pt has not been performing loud /a/ as frequently as directed. SLP explained rationale for loud /a/ and pt told SLP  he will put forth more effort to accomplish loud /a/ at home, with everyday sentences, and work on the worksheets in order to carryover louder/WNL speech to conversational language/speech.  Pt with consistent hesitation and pausing in conversational speech today demonstrating notable bradyphrenia. Drooling compensations targeted throughout session - pt with minimal awareness.   11/06/22: CES provided and pt and wife to bring back next session. Drooling compensation with SLP cue x1 today during session. Loud /a/ produced with average low 80s dB, cues necessary for maintaining loudness and pitch for entire production instead of loudness and pitch fade. Everyday sentences were appropriate and were produced with cues for  inhibiting loudness fade consistently. SLP provided pt with simple verbal expression homework for him 20 minutes daily.    11/04/22: Pt and wife to fill out CES next session.  Drooling compensations were  covered with pt and wife today. Pt req'd cues x3 for "stop, gather, swallow hard twice". Loud /a/ req'd usual mod cues for loudness, steady pitch, and breath from abdomen. SLP then asked pt about 10 "everyday" sentences and pt generated one in ~4 minutes. SLP told pt and wife to "team up" to think of 7-8 more by putting list given today on refrigerator and write them down as they occur in the next 2-3 days and bring to next ST session.   10/16/22: Educated patient on possible impact of Parkinson's Disease (PD) on speech, swallowing, and cognition. Denied changes in swallowing and minimal changes in cognition (not primary concern at this time). Reported decline in conversational volume resulting in wife asking for repetition and clarification. Educated patient on ways to optimize vocal intensity, including utilizing principle of intent and using louder speaking volume. Pt was responsive to SLP education and instruction with pt able to optimize volume with cues and modeling for short utterances.  Also recommended using intentional swallows to manage saliva more effectively as occasional minimal drooling noted. HEP initiated and provided today. Pt and wife verbalized understanding and agreement with SLP recs.   PATIENT EDUCATION: Education details: see "today's treatment" Person educated: Patient and Spouse Education method: Explanation, Demonstration, and Handouts Education comprehension: verbalized understanding, returned demonstration, and needs further education   GOALS: Goals reviewed with patient? Yes  SHORT TERM GOALS: Target date: 11/14/2022 (extending to 11/29/22 due to visits)  Pt will produce loud /a/ with at least low 80s dB average over 2 sessions Baseline: 11/19/22 Goal status: met  2.  Pt will demo low 70s dB average in 8/10 sentence responses over 2 sessions Baseline:  Goal status: not met  3.  Pt will demo low 70s dB average in 3-5 minutes simple conversation given occasional min A over 2 sessions  Baseline:  Goal status: not met  4.  Pt will generate abdominal breathing 80% of the time when engaging in 5 minutes simple conversation over 2 sessions Baseline:  Goal status: not targeted due to focus on loudness in simple responses  5.  Pt will effectively manage saliva with volitional swallows for 3 or less episodes given occasional min A over 2 sessions Baseline: 11/28/22 Goal status: Partially met   LONG TERM GOALS: Target date: 01/09/23   Pt will complete homework (HEP) for at least 7 consecutive days Baseline:  Goal status: Met  2.  Pt will demo WNL volume (low 70s dB average) in 8 minutes simple conversation with occasional min A over 2 sessions Baseline:  Goal status: Modified  3.  Pt will use abdominal breathing 70% of the time in 8 minutes simple conversation over 2 sessions  Baseline:  Goal status: Modified  4.  Pt will employ memory compensations (as needed) to effectively manage daily tasks given less frequent A from wife by last ST  session Baseline:  Goal status: Ongoing  ASSESSMENT:  CLINICAL IMPRESSION: Patient is a 87 y.o. M who was referred for hypophonia secondary to Parkinson's Disease. See "today's treatment" for more information. Pt will likely improve in attention for tasks where there is a visual ending point - e.g., assembly of a small item, folding clothes, etc. Until functional math task, pt's volume averaged upper 60s dB. During eval, pt was responsive to SLP modeling and cues to increase volume to upper 60s dB for short structured phrases. Denied overt changes in swallowing or cognition (some minimal changes - not primary concern at this time). Some rare  throat clearing and drooling exhibited today. Pt would benefit from skilled ST intervention to optimize communication effectiveness, management of saliva, and possibly cognitive compensations to aid daily functioning.   OBJECTIVE IMPAIRMENTS: Objective impairments include memory, dysarthria, and Sialorrhea . These impairments are limiting patient from household responsibilities and effectively communicating at home and in community.Factors affecting potential to achieve goals and functional outcome are medical prognosis.. Patient will benefit from skilled SLP services to address above impairments and improve overall function.  REHAB POTENTIAL: Good  PLAN:  SLP FREQUENCY: 2x/week  SLP DURATION: 12 weeks  PLANNED INTERVENTIONS: Cueing hierachy, Cognitive reorganization, Internal/external aids, Functional tasks, Multimodal communication approach, SLP instruction and feedback, Compensatory strategies, and Patient/family education    [facility], CCC-SLP 12/17/2022, 2:10 PM

## 2022-12-19 ENCOUNTER — Ambulatory Visit: Payer: Medicare Other

## 2022-12-19 DIAGNOSIS — R471 Dysarthria and anarthria: Secondary | ICD-10-CM | POA: Diagnosis not present

## 2022-12-19 DIAGNOSIS — R41841 Cognitive communication deficit: Secondary | ICD-10-CM | POA: Diagnosis not present

## 2022-12-19 NOTE — Therapy (Signed)
OUTPATIENT SPEECH LANGUAGE PATHOLOGY PARKINSON'S TREATMENT   Patient Name: [name] MRN: [id number] DOB:10-Jul-1936, 87 y.o., male Today's Date: 12/19/2022  PCP: Garlan Fillers MD REFERRING PROVIDER: Huston Foley, MD  END OF SESSION:  End of Session - 12/19/22 1401     Visit Number 13    Number of Visits 25    Date for SLP Re-Evaluation 01/09/23    Authorization Type Medicare A & B    SLP Start Time 1403    SLP Stop Time  1445    SLP Time Calculation (min) 42 min    Activity Tolerance Patient tolerated treatment well                  Past Medical History:  Diagnosis Date   Ankylosing spondylitis dx'd ~ 1974   Arthritis    "back" (08/10/2015)   BENIGN PROSTATIC HYPERTROPHY, WITH OBSTRUCTION 01/15/2010   Bleeding duodenal ulcer    Bleeding esophageal ulcer    Bleeding stomach ulcer    GERD 02/22/2008   History of blood transfusion 1988   "lost ~ 1/2 of my blood volume from multiple bleeding ulcers"   History of hiatal hernia    HYPERGLYCEMIA 11/18/2007   HYPERLIPIDEMIA 02/22/2008   HYPERTENSION, UNSPECIFIED 11/10/2009   Kidney stones    Osteoarthritis    c-spine   PEPTIC ULCER DISEASE 11/17/2007   Situational depression    "son died in MVA July 06, 2015"   TIA (transient ischemic attack)    "not that I know of in the past; they are trying to determine if I've had one today (08/10/2015)"   Past Surgical History:  Procedure Laterality Date   CATARACT EXTRACTION W/ INTRAOCULAR LENS  IMPLANT, BILATERAL Bilateral 2013   Patient Active Problem List   Diagnosis Date Noted   Ankylosing spondylitis of cervicothoracic region 10/16/2016   TIA (transient ischemic attack) 08/10/2015   Carotid stenosis 08/04/2014   Hyperlipidemia 02/21/2012   BENIGN PROSTATIC HYPERTROPHY, WITH OBSTRUCTION 01/15/2010   Essential hypertension 11/10/2009   GERD 02/22/2008   NEPHROLITHIASIS, HX OF 11/17/2007     ONSET DATE: 10/02/22 (referral date); PD dx 4 years ago   REFERRING  DIAG:  R49.8 (ICD-10-CM) - Hypophonia  G20.A2 (ICD-10-CM) - Parkinson's disease with fluctuating manifestations, unspecified whether dyskinesia present   THERAPY DIAG: Dysarthria and anarthria  Cognitive communication deficit  Rationale for Evaluation and Treatment: Rehabilitation  SUBJECTIVE:   SUBJECTIVE STATEMENT:  [name] indicated would like to improve his math skills, in light of last session.   Pt accompanied by: significant other   PERTINENT HISTORY: PD, complex medical history of peptic ulcer disease with history of upper GI bleed, TIA, depression, degenerative disc disease in the neck, osteoarthritis, kidney stones, hyperlipidemia, hypertension, history of hiatal hernia, BPH, arthritis, and ankylosing spondylitis, and parkinsonism. Referred by [facility] OP team for hypophonia. No prior ST for PD reported.   PAIN: Are you having pain? No   PATIENT GOALS: improve loudness   OBJECTIVE:    PATIENT REPORTED OUTCOME MEASURES (PROM): CES - provided 11/05/22 to wife and pt. [name] scored pt 16/40, pt scored himself 18/40 with higher scores as better effectiveness. [name] said talking on the phone was not at all effective with the rest as 2s and 3s. [name]'s answers were all 2s and 3s.  TODAY'S TREATMENT:  12/19/22: SLP opened up with asking pt thoughts on previous session. He expressed "s" statement. Wife and pt have been engaging in simple math since previous session and [name] believes drilling with addition until pt is successful and then starting subtraction until pt is successful, then multiplication and division with the same desired outcomes will be of assistance. SLP agreed and also pointed out that pt's attention skills play a large role in how successful pt will be with longer mathematical (and other longer non-math) tasks. SLP suggested pt and wife  also have pt engage in tasks that would take longer than 3 minutes at home, and SLP provided example. Pt and wife to cont to work on math at home.   12/17/22: Loud /a/ produced in upper 80s - low 90s dB with rare cues for loudness fade. SLP worked on pt's loudness in functional math problems. Pt req'd mod-max A consistently for redirection of sustanied attention to solve simple 3-step functional math problem. SLP attempted to simplify the problem however pt had same difficulties with attention. SLP unsure if this skill will improve tremendously given pt's Parkinson's, so SLP suggested pt use calculator for math if necessary.   12/12/22: Loud /a/ generated with low 90s-upper 80s dB, with consistent cues for pt to lower pitch into speaking range. These cues were largely unsuccessful even with online piano played at speaking Hz. [name] stated his everyday sentences with low 80s dB independently.  In short conversational tasks of <5 minutes pt maintained volume of mid-upper 60s dB, successful for first 3 segments but after this pt demonstrated mental fatigue and incr'd cognitive load re: memories - speech averaged mid 60s at this point. Encouraged pt/wife to cont to have conversations like this at home in order for pt to practice, possibly with daughter. Pt may bring in photos next session for conversation.  12/10/22: Loud /a/ generated with low 90s-upper 80s dB, with everyday sentences generated with low 80s dB independently. SLP engaged pt with word problems which pt had to solve (simple) and maintain louder, energized speech. Pt did this with 25% success on first attempt, and incr'd to 80% with second attempt. SLP told pt to practice these with wife as he and wife report he has had more challenge with working memory since fall in the autumn.  12/05/22:  Loud /a/ generated with visual cue for minimizing loudness decay with low 90s dB, with everyday sentences they were generated with low 80s dB for first 6-7 and  then pt had loudness decay on the last 4. SLP attempted max cues for last 4 sentences repeatedly but pt cont'd loudness decay on these 4 sentences. SLP then abandoned further work on everyday sentences- visual cues would not work due to short duration, and pt could not/did not hear or feel volume or effort difference between beginnings and ends of sentences despite SLP max cues. SLP worked with pt with simple one-sentence responses with more-WNL volume and pt benefited from min-mod A usually for combating loudness decay mostly due to cognitive load. SLP provided pt homework for simple sentence responses.   11/28/22: With gum today, pt had two drooling incidents over the course of the session, compared to 6-7 in previous sessions without using the gum.  Loud /a/ completed today average upper 80s- lower 90s dB. SLP used voice analyst program to improve pt's consistency with incr'd loudness, and combat pt's loudness fade. Everyday sentences with average low 80s dB, and pt remained largely at same volume as pt progressed through  his list, unlike previous session. Sentence level responses (dual meaning words in which pt provided two separate sentences using the word in the sentence) were average upper 60s to lower 70s dB. Meaning of expressions was not as good as dual meaning words with average mid 60s dB. SLP explained to [name] how to modify tasks which may be too complex cognitively for pt.  11/26/22: Loud /a/ completed today average upper 80s dB (began in low 90s fading to lower-mid 80s dB) with consistent loudness fade after 4 seconds. Better after SLP usual verbal cues for consistent loudness for last two reps. Everyday sentences with average low 80s dB, but sentences got softer as pt progressed through his list. He req'd consistent cues for loudness by the last 3 sentences. SLP worked with pt on word level responses with his average low70s dB, so SLP made task more complex requiring two word answers with incr'd  cognitive load and pt average decr'd to mid-upper 60s dB. This was partly due to pt making responses too long. SLP reiterated shorter responses and this assisted pt in increasing loudness average to upper 60s dB. Pt forgot to begin to chew gum for saliva management prior to session so he inserted gum 5-10 minutes into session and req'd occasional cues to swallow to minimize saliva in oral cavity. SLP explained that next session it would be better to insert gum prior to session.   11/21/22: Fred completed loud /a/ with upper 80s dB with limited loudness fade today. Everyday sentences were forgotten at home but pt recalled 6 of his10 sentences in the low 80s dB average. It appears as if pt is practicing /a/ and sentences at home.  SLP worked with pt with sentence responses with "3 clues" task however pt had difficulty with reasoning and processing with 75% of the stimuli - wife commented that she would not have guessed the ones that were more difficult for pt either. Because of this SLP provided more concrete stimuli for homework, yet tasks that would still work with pt's cognition. Pt witih questions if cognition can be  improved - SLP suggested we would work to help cognition be maintained as long as possible with tasks like SLP was sending home. Total A with compensations for anterior labial leakage; suggested postural changes, and gum to diminish/lessen frequency of drooling.  11/19/22: Pt reports engaging with worksheets since last session for practicing louder speech.  Loud /a/ today with much improved procedure, obvious that pt has been practicing outside ST sessions. Loudness average upper 80s dB with consistent loudness fade the last 1 second. Everyday sentences began at low-mid 80s average and ended with upper 70s dB average largely due to fatigue/decr'd attention. SLP engaged pt with sentence responses however pt expanded responses to 2-3 sentences and req'd SLP cues to shorten responses. Average  loudness mid 60s dB. Pt with great difficulty with slightly more cognitive load. Cues consistently necessary for saliva management.   11/12/22: CES documented as above. Wife stated "I didn't want you to see any frustrations or negatives" - which leads SLP to believe if her assessment is an accurate representation of pt's communication effectiveness at home. Pt began loud /a/ and incr'd pitch with each successive rep, with varying procedure. SLP ascertained pt has not been performing loud /a/ as frequently as directed. SLP explained rationale for loud /a/ and pt told SLP he will put forth more effort to accomplish loud /a/ at home, with everyday sentences, and work on the worksheets in order  to carryover louder/WNL speech to conversational language/speech.  Pt with consistent hesitation and pausing in conversational speech today demonstrating notable bradyphrenia. Drooling compensations targeted throughout session - pt with minimal awareness.   11/06/22: CES provided and pt and wife to bring back next session. Drooling compensation with SLP cue x1 today during session. Loud /a/ produced with average low 80s dB, cues necessary for maintaining loudness and pitch for entire production instead of loudness and pitch fade. Everyday sentences were appropriate and were produced with cues for  inhibiting loudness fade consistently. SLP provided pt with simple verbal expression homework for him 20 minutes daily.    11/04/22: Pt and wife to fill out CES next session.  Drooling compensations were covered with pt and wife today. Pt req'd cues x3 for "stop, gather, swallow hard twice". Loud /a/ req'd usual mod cues for loudness, steady pitch, and breath from abdomen. SLP then asked pt about 10 "everyday" sentences and pt generated one in ~4 minutes. SLP told pt and wife to "team up" to think of 7-8 more by putting list given today on refrigerator and write them down as they occur in the next 2-3 days and bring to next ST  session.   10/16/22: Educated patient on possible impact of Parkinson's Disease (PD) on speech, swallowing, and cognition. Denied changes in swallowing and minimal changes in cognition (not primary concern at this time). Reported decline in conversational volume resulting in wife asking for repetition and clarification. Educated patient on ways to optimize vocal intensity, including utilizing principle of intent and using louder speaking volume. Pt was responsive to SLP education and instruction with pt able to optimize volume with cues and modeling for short utterances. Also recommended using intentional swallows to manage saliva more effectively as occasional minimal drooling noted. HEP initiated and provided today. Pt and wife verbalized understanding and agreement with SLP recs.   PATIENT EDUCATION: Education details: see "today's treatment" Person educated: Patient and Spouse Education method: Explanation, Demonstration, and Handouts Education comprehension: verbalized understanding, returned demonstration, and needs further education   GOALS: Goals reviewed with patient? Yes  SHORT TERM GOALS: Target date: 11/14/2022 (extending to 11/29/22 due to visits)  Pt will produce loud /a/ with at least low 80s dB average over 2 sessions Baseline: 11/19/22 Goal status: met  2.  Pt will demo low 70s dB average in 8/10 sentence responses over 2 sessions Baseline:  Goal status: not met  3.  Pt will demo low 70s dB average in 3-5 minutes simple conversation given occasional min A over 2 sessions  Baseline:  Goal status: not met  4.  Pt will generate abdominal breathing 80% of the time when engaging in 5 minutes simple conversation over 2 sessions Baseline:  Goal status: not targeted due to focus on loudness in simple responses  5.  Pt will effectively manage saliva with volitional swallows for 3 or less episodes given occasional min A over 2 sessions Baseline: 11/28/22 Goal status: Partially  met   LONG TERM GOALS: Target date: 01/09/23   Pt will complete homework (HEP) for at least 7 consecutive days Baseline:  Goal status: Met  2.  Pt will demo WNL volume (low 70s dB average) in 8 minutes simple conversation with occasional min A over 2 sessions Baseline:  Goal status: Modified  3.  Pt will use abdominal breathing 70% of the time in 8 minutes simple conversation over 2 sessions  Baseline:  Goal status: Modified  4.  Pt will employ memory compensations (  as needed) to effectively manage daily tasks given less frequent A from wife by last ST session Baseline:  Goal status: Ongoing  ASSESSMENT:  CLINICAL IMPRESSION: Patient is a 87 y.o. M who was referred for hypophonia secondary to Parkinson's Disease. See "today's treatment" for more information. During eval, pt was responsive to SLP modeling and cues to increase volume to upper 60s dB for short structured phrases. Denied overt changes in swallowing or cognition (some minimal changes - not primary concern at this time). Some rare throat clearing and drooling exhibited today. Pt would benefit from skilled ST intervention to optimize communication effectiveness, management of saliva, and possibly cognitive compensations to aid daily functioning.   OBJECTIVE IMPAIRMENTS: Objective impairments include memory, dysarthria, and Sialorrhea . These impairments are limiting patient from household responsibilities and effectively communicating at home and in community.Factors affecting potential to achieve goals and functional outcome are medical prognosis.. Patient will benefit from skilled SLP services to address above impairments and improve overall function.  REHAB POTENTIAL: Good  PLAN:  SLP FREQUENCY: 2x/week  SLP DURATION: 12 weeks  PLANNED INTERVENTIONS: Cueing hierachy, Cognitive reorganization, Internal/external aids, Functional tasks, Multimodal communication approach, SLP instruction and feedback, Compensatory  strategies, and Patient/family education    [facility], CCC-SLP 12/19/2022, 2:02 PM

## 2022-12-24 ENCOUNTER — Ambulatory Visit: Payer: Medicare Other

## 2022-12-24 DIAGNOSIS — R471 Dysarthria and anarthria: Secondary | ICD-10-CM | POA: Diagnosis not present

## 2022-12-24 DIAGNOSIS — R41841 Cognitive communication deficit: Secondary | ICD-10-CM

## 2022-12-24 NOTE — Therapy (Signed)
OUTPATIENT SPEECH LANGUAGE PATHOLOGY PARKINSON'S TREATMENT   Patient Name: [name] MRN: [id number] DOB:07/01/1936, 87 y.o., male Today's Date: 12/24/2022  PCP: Garlan Fillers MD REFERRING PROVIDER: Huston Foley, MD  END OF SESSION:  End of Session - 12/24/22 1453     Visit Number 14    Number of Visits 25    Date for SLP Re-Evaluation 01/09/23    Authorization Type Medicare A & B    SLP Start Time 1450    SLP Stop Time  1530    SLP Time Calculation (min) 40 min    Activity Tolerance Patient tolerated treatment well                  Past Medical History:  Diagnosis Date   Ankylosing spondylitis (HCC) dx'd ~ 1974   Arthritis    "back" (08/10/2015)   BENIGN PROSTATIC HYPERTROPHY, WITH OBSTRUCTION 01/15/2010   Bleeding duodenal ulcer    Bleeding esophageal ulcer    Bleeding stomach ulcer    GERD 02/22/2008   History of blood transfusion 1988   "lost ~ 1/2 of my blood volume from multiple bleeding ulcers"   History of hiatal hernia    HYPERGLYCEMIA 11/18/2007   HYPERLIPIDEMIA 02/22/2008   HYPERTENSION, UNSPECIFIED 11/10/2009   Kidney stones    Osteoarthritis    c-spine   PEPTIC ULCER DISEASE 11/17/2007   Situational depression    "son died in MVA 06-22-15"   TIA (transient ischemic attack)    "not that I know of in the past; they are trying to determine if I've had one today (08/10/2015)"   Past Surgical History:  Procedure Laterality Date   CATARACT EXTRACTION W/ INTRAOCULAR LENS  IMPLANT, BILATERAL Bilateral 2013   Patient Active Problem List   Diagnosis Date Noted   Ankylosing spondylitis of cervicothoracic region (HCC) 10/16/2016   TIA (transient ischemic attack) 08/10/2015   Carotid stenosis 08/04/2014   Hyperlipidemia 02/21/2012   BENIGN PROSTATIC HYPERTROPHY, WITH OBSTRUCTION 01/15/2010   Essential hypertension 11/10/2009   GERD 02/22/2008   NEPHROLITHIASIS, HX OF 11/17/2007     ONSET DATE: 10/02/22 (referral date); PD dx 4 years ago    REFERRING DIAG:  R49.8 (ICD-10-CM) - Hypophonia  G20.A2 (ICD-10-CM) - Parkinson's disease with fluctuating manifestations, unspecified whether dyskinesia present   THERAPY DIAG: Dysarthria and anarthria  Cognitive communication deficit  Rationale for Evaluation and Treatment: Rehabilitation  SUBJECTIVE:   SUBJECTIVE STATEMENT:  "I'm disappointed I can't generate the  - - necessary explanation for you." (Re: [address] [place] in [place])  Pt accompanied by: significant other   PERTINENT HISTORY: PD, complex medical history of peptic ulcer disease with history of upper GI bleed, TIA, depression, degenerative disc disease in the neck, osteoarthritis, kidney stones, hyperlipidemia, hypertension, history of hiatal hernia, BPH, arthritis, and ankylosing spondylitis, and parkinsonism. Referred by [facility] OP team for hypophonia. No prior ST for PD reported.   PAIN: Are you having pain? No   PATIENT GOALS: improve loudness   OBJECTIVE:    PATIENT REPORTED OUTCOME MEASURES (PROM): CES - provided 11/05/22 to wife and pt. [name] scored pt 16/40, pt scored himself 18/40 with higher scores as better effectiveness. [name] said talking on the phone was not at all effective with the rest as 2s and 3s. [name]'s answers were all 2s and 3s.  TODAY'S TREATMENT:  12/24/22: Loud /a/ averaged upper 80s dB, sentences "with intent" read with average lower 80s dB. SLP recorded pt to demo his speech is quieter than normal/SLP speech. SLP educated pt on diagphram role in WNL volume speech and pt stated he oculd intermittently feel diaphragm working. SLP worked with pt on phrase responses in structured tasks and pt maintained WNL volume - stated he oculd feel diaphragm working. Little carryover to conversational speech. [name] states little carryover noted at home.   12/19/22: SLP  opened up with asking pt thoughts on previous session. He expressed "s" statement. Wife and pt have been engaging in simple math since previous session and [name] believes drilling with addition until pt is successful and then starting subtraction until pt is successful, then multiplication and division with the same desired outcomes will be of assistance. SLP agreed and also pointed out that pt's attention skills play a large role in how successful pt will be with longer mathematical (and other longer non-math) tasks. SLP suggested pt and wife also have pt engage in tasks that would take longer than 3 minutes at home, and SLP provided example. Pt and wife to cont to work on math at home.   12/17/22: Loud /a/ produced in upper 80s - low 90s dB with rare cues for loudness fade. SLP worked on pt's loudness in functional math problems. Pt req'd mod-max A consistently for redirection of sustanied attention to solve simple 3-step functional math problem. SLP attempted to simplify the problem however pt had same difficulties with attention. SLP unsure if this skill will improve tremendously given pt's Parkinson's, so SLP suggested pt use calculator for math if necessary.   12/12/22: Loud /a/ generated with low 90s-upper 80s dB, with consistent cues for pt to lower pitch into speaking range. These cues were largely unsuccessful even with online piano played at speaking Hz. [name] stated his everyday sentences with low 80s dB independently.  In short conversational tasks of <5 minutes pt maintained volume of mid-upper 60s dB, successful for first 3 segments but after this pt demonstrated mental fatigue and incr'd cognitive load re: memories - speech averaged mid 60s at this point. Encouraged pt/wife to cont to have conversations like this at home in order for pt to practice, possibly with daughter. Pt may bring in photos next session for conversation.  12/10/22: Loud /a/ generated with low 90s-upper 80s dB, with everyday  sentences generated with low 80s dB independently. SLP engaged pt with word problems which pt had to solve (simple) and maintain louder, energized speech. Pt did this with 25% success on first attempt, and incr'd to 80% with second attempt. SLP told pt to practice these with wife as he and wife report he has had more challenge with working memory since fall in the autumn.  12/05/22:  Loud /a/ generated with visual cue for minimizing loudness decay with low 90s dB, with everyday sentences they were generated with low 80s dB for first 6-7 and then pt had loudness decay on the last 4. SLP attempted max cues for last 4 sentences repeatedly but pt cont'd loudness decay on these 4 sentences. SLP then abandoned further work on everyday sentences- visual cues would not work due to short duration, and pt could not/did not hear or feel volume or effort difference between beginnings and ends of sentences despite SLP max cues. SLP worked with pt with simple one-sentence responses with more-WNL volume and pt benefited from min-mod A usually for combating loudness decay mostly due to  cognitive load. SLP provided pt homework for simple sentence responses.   11/28/22: With gum today, pt had two drooling incidents over the course of the session, compared to 6-7 in previous sessions without using the gum.  Loud /a/ completed today average upper 80s- lower 90s dB. SLP used voice analyst program to improve pt's consistency with incr'd loudness, and combat pt's loudness fade. Everyday sentences with average low 80s dB, and pt remained largely at same volume as pt progressed through his list, unlike previous session. Sentence level responses (dual meaning words in which pt provided two separate sentences using the word in the sentence) were average upper 60s to lower 70s dB. Meaning of expressions was not as good as dual meaning words with average mid 60s dB. SLP explained to [name] how to modify tasks which may be too complex  cognitively for pt.  11/26/22: Loud /a/ completed today average upper 80s dB (began in low 90s fading to lower-mid 80s dB) with consistent loudness fade after 4 seconds. Better after SLP usual verbal cues for consistent loudness for last two reps. Everyday sentences with average low 80s dB, but sentences got softer as pt progressed through his list. He req'd consistent cues for loudness by the last 3 sentences. SLP worked with pt on word level responses with his average low70s dB, so SLP made task more complex requiring two word answers with incr'd cognitive load and pt average decr'd to mid-upper 60s dB. This was partly due to pt making responses too long. SLP reiterated shorter responses and this assisted pt in increasing loudness average to upper 60s dB. Pt forgot to begin to chew gum for saliva management prior to session so he inserted gum 5-10 minutes into session and req'd occasional cues to swallow to minimize saliva in oral cavity. SLP explained that next session it would be better to insert gum prior to session.   11/21/22: Fred completed loud /a/ with upper 80s dB with limited loudness fade today. Everyday sentences were forgotten at home but pt recalled 6 of his10 sentences in the low 80s dB average. It appears as if pt is practicing /a/ and sentences at home.  SLP worked with pt with sentence responses with "3 clues" task however pt had difficulty with reasoning and processing with 75% of the stimuli - wife commented that she would not have guessed the ones that were more difficult for pt either. Because of this SLP provided more concrete stimuli for homework, yet tasks that would still work with pt's cognition. Pt witih questions if cognition can be  improved - SLP suggested we would work to help cognition be maintained as long as possible with tasks like SLP was sending home. Total A with compensations for anterior labial leakage; suggested postural changes, and gum to diminish/lessen frequency of  drooling.  11/19/22: Pt reports engaging with worksheets since last session for practicing louder speech.  Loud /a/ today with much improved procedure, obvious that pt has been practicing outside ST sessions. Loudness average upper 80s dB with consistent loudness fade the last 1 second. Everyday sentences began at low-mid 80s average and ended with upper 70s dB average largely due to fatigue/decr'd attention. SLP engaged pt with sentence responses however pt expanded responses to 2-3 sentences and req'd SLP cues to shorten responses. Average loudness mid 60s dB. Pt with great difficulty with slightly more cognitive load. Cues consistently necessary for saliva management.   11/12/22: CES documented as above. Wife stated "I didn't want you to  see any frustrations or negatives" - which leads SLP to believe if her assessment is an accurate representation of pt's communication effectiveness at home. Pt began loud /a/ and incr'd pitch with each successive rep, with varying procedure. SLP ascertained pt has not been performing loud /a/ as frequently as directed. SLP explained rationale for loud /a/ and pt told SLP he will put forth more effort to accomplish loud /a/ at home, with everyday sentences, and work on the worksheets in order to carryover louder/WNL speech to conversational language/speech.  Pt with consistent hesitation and pausing in conversational speech today demonstrating notable bradyphrenia. Drooling compensations targeted throughout session - pt with minimal awareness.   11/06/22: CES provided and pt and wife to bring back next session. Drooling compensation with SLP cue x1 today during session. Loud /a/ produced with average low 80s dB, cues necessary for maintaining loudness and pitch for entire production instead of loudness and pitch fade. Everyday sentences were appropriate and were produced with cues for  inhibiting loudness fade consistently. SLP provided pt with simple verbal expression  homework for him 20 minutes daily.    11/04/22: Pt and wife to fill out CES next session.  Drooling compensations were covered with pt and wife today. Pt req'd cues x3 for "stop, gather, swallow hard twice". Loud /a/ req'd usual mod cues for loudness, steady pitch, and breath from abdomen. SLP then asked pt about 10 "everyday" sentences and pt generated one in ~4 minutes. SLP told pt and wife to "team up" to think of 7-8 more by putting list given today on refrigerator and write them down as they occur in the next 2-3 days and bring to next ST session.   10/16/22: Educated patient on possible impact of Parkinson's Disease (PD) on speech, swallowing, and cognition. Denied changes in swallowing and minimal changes in cognition (not primary concern at this time). Reported decline in conversational volume resulting in wife asking for repetition and clarification. Educated patient on ways to optimize vocal intensity, including utilizing principle of intent and using louder speaking volume. Pt was responsive to SLP education and instruction with pt able to optimize volume with cues and modeling for short utterances. Also recommended using intentional swallows to manage saliva more effectively as occasional minimal drooling noted. HEP initiated and provided today. Pt and wife verbalized understanding and agreement with SLP recs.   PATIENT EDUCATION: Education details: see "today's treatment" Person educated: Patient and Spouse Education method: Explanation, Demonstration, and Handouts Education comprehension: verbalized understanding, returned demonstration, and needs further education   GOALS: Goals reviewed with patient? Yes  SHORT TERM GOALS: Target date: 11/14/2022 (extending to 11/29/22 due to visits)  Pt will produce loud /a/ with at least low 80s dB average over 2 sessions Baseline: 11/19/22 Goal status: met  2.  Pt will demo low 70s dB average in 8/10 sentence responses over 2 sessions Baseline:   Goal status: not met  3.  Pt will demo low 70s dB average in 3-5 minutes simple conversation given occasional min A over 2 sessions  Baseline:  Goal status: not met  4.  Pt will generate abdominal breathing 80% of the time when engaging in 5 minutes simple conversation over 2 sessions Baseline:  Goal status: not targeted due to focus on loudness in simple responses  5.  Pt will effectively manage saliva with volitional swallows for 3 or less episodes given occasional min A over 2 sessions Baseline: 11/28/22 Goal status: Partially met   LONG TERM GOALS: Target  date: 01/09/23   Pt will complete homework (HEP) for at least 7 consecutive days Baseline:  Goal status: Met  2.  Pt will demo WNL volume (low 70s dB average) in 8 minutes simple conversation with occasional min A over 2 sessions Baseline:  Goal status: Modified  3.  Pt will use abdominal breathing 70% of the time in 8 minutes simple conversation over 2 sessions  Baseline:  Goal status: Modified  4.  Pt will employ memory compensations (as needed) to effectively manage daily tasks given less frequent A from wife by last ST session Baseline:  Goal status: Ongoing  ASSESSMENT:  CLINICAL IMPRESSION: Patient is a 87 y.o. M who was referred for hypophonia secondary to Parkinson's Disease. See "today's treatment" for more information. During eval, pt was responsive to SLP modeling and cues to increase volume to upper 60s dB for short structured phrases. Denied overt changes in swallowing or cognition (some minimal changes - not primary concern at this time). Some rare throat clearing and drooling exhibited today. Pt would benefit from skilled ST intervention to optimize communication effectiveness, management of saliva, and possibly cognitive compensations to aid daily functioning.   OBJECTIVE IMPAIRMENTS: Objective impairments include memory, dysarthria, and Sialorrhea . These impairments are limiting patient from household  responsibilities and effectively communicating at home and in community.Factors affecting potential to achieve goals and functional outcome are medical prognosis.. Patient will benefit from skilled SLP services to address above impairments and improve overall function.  REHAB POTENTIAL: Good  PLAN:  SLP FREQUENCY: 2x/week  SLP DURATION: 12 weeks  PLANNED INTERVENTIONS: Cueing hierachy, Cognitive reorganization, Internal/external aids, Functional tasks, Multimodal communication approach, SLP instruction and feedback, Compensatory strategies, and Patient/family education    [facility], CCC-SLP 12/24/2022, 2:53 PM

## 2022-12-26 ENCOUNTER — Ambulatory Visit: Payer: Medicare Other | Attending: Neurology

## 2022-12-26 ENCOUNTER — Other Ambulatory Visit: Payer: Self-pay | Admitting: Cardiology

## 2022-12-26 DIAGNOSIS — I10 Essential (primary) hypertension: Secondary | ICD-10-CM | POA: Diagnosis not present

## 2022-12-26 DIAGNOSIS — K219 Gastro-esophageal reflux disease without esophagitis: Secondary | ICD-10-CM | POA: Diagnosis not present

## 2022-12-26 DIAGNOSIS — R41841 Cognitive communication deficit: Secondary | ICD-10-CM | POA: Diagnosis not present

## 2022-12-26 DIAGNOSIS — E785 Hyperlipidemia, unspecified: Secondary | ICD-10-CM | POA: Diagnosis not present

## 2022-12-26 DIAGNOSIS — R471 Dysarthria and anarthria: Secondary | ICD-10-CM

## 2022-12-26 DIAGNOSIS — E039 Hypothyroidism, unspecified: Secondary | ICD-10-CM | POA: Diagnosis not present

## 2022-12-26 NOTE — Therapy (Signed)
OUTPATIENT SPEECH LANGUAGE PATHOLOGY PARKINSON'S TREATMENT   Patient Name: [name] MRN: [id number] DOB:09-22-1935, 87 y.o., male Today's Date: 12/26/2022  PCP: Garlan Fillers MD REFERRING PROVIDER: Huston Foley, MD  END OF SESSION:  End of Session - 12/26/22 1723     Visit Number 15    Number of Visits 25    Date for SLP Re-Evaluation 01/09/23    Authorization Type Medicare A & B    SLP Start Time 1618    SLP Stop Time  1700    SLP Time Calculation (min) 42 min    Activity Tolerance Patient tolerated treatment well                   Past Medical History:  Diagnosis Date   Ankylosing spondylitis (HCC) dx'd ~ 1974   Arthritis    "back" (08/10/2015)   BENIGN PROSTATIC HYPERTROPHY, WITH OBSTRUCTION 01/15/2010   Bleeding duodenal ulcer    Bleeding esophageal ulcer    Bleeding stomach ulcer    GERD 02/22/2008   History of blood transfusion 1988   "lost ~ 1/2 of my blood volume from multiple bleeding ulcers"   History of hiatal hernia    HYPERGLYCEMIA 11/18/2007   HYPERLIPIDEMIA 02/22/2008   HYPERTENSION, UNSPECIFIED 11/10/2009   Kidney stones    Osteoarthritis    c-spine   PEPTIC ULCER DISEASE 11/17/2007   Situational depression    "son died in MVA 06/19/2015"   TIA (transient ischemic attack)    "not that I know of in the past; they are trying to determine if I've had one today (08/10/2015)"   Past Surgical History:  Procedure Laterality Date   CATARACT EXTRACTION W/ INTRAOCULAR LENS  IMPLANT, BILATERAL Bilateral 2013   Patient Active Problem List   Diagnosis Date Noted   Ankylosing spondylitis of cervicothoracic region (HCC) 10/16/2016   TIA (transient ischemic attack) 08/10/2015   Carotid stenosis 08/04/2014   Hyperlipidemia 02/21/2012   BENIGN PROSTATIC HYPERTROPHY, WITH OBSTRUCTION 01/15/2010   Essential hypertension 11/10/2009   GERD 02/22/2008   NEPHROLITHIASIS, HX OF 11/17/2007     ONSET DATE: 10/02/22 (referral date); PD dx 4 years ago    REFERRING DIAG:  R49.8 (ICD-10-CM) - Hypophonia  G20.A2 (ICD-10-CM) - Parkinson's disease with fluctuating manifestations, unspecified whether dyskinesia present   THERAPY DIAG: Dysarthria and anarthria  Cognitive communication deficit  Rationale for Evaluation and Treatment: Rehabilitation  SUBJECTIVE:   SUBJECTIVE STATEMENT:  "We worked on my addition."  Pt accompanied by: significant other   PERTINENT HISTORY: PD, complex medical history of peptic ulcer disease with history of upper GI bleed, TIA, depression, degenerative disc disease in the neck, osteoarthritis, kidney stones, hyperlipidemia, hypertension, history of hiatal hernia, BPH, arthritis, and ankylosing spondylitis, and parkinsonism. Referred by [facility] OP team for hypophonia. No prior ST for PD reported.   PAIN: Are you having pain? No   PATIENT GOALS: improve loudness   OBJECTIVE:    PATIENT REPORTED OUTCOME MEASURES (PROM): CES - provided 11/05/22 to wife and pt. [name] scored pt 16/40, pt scored himself 18/40 with higher scores as better effectiveness. [name] said talking on the phone was not at all effective with the rest as 2s and 3s. [name]'s answers were all 2s and 3s.  TODAY'S TREATMENT:  12/26/22: Discussion today on pt's math work at home - according to pt/wife pt is getting faster and more accurate with addition. Loud /a/ averaged upper 80s dB, sentences "with intent" read with average lower 80s dB, with cues for avoiding loudness fade. Semi-structured responses completed with average upper 60s dB. Spontaneous carryover of louder speech seen to comments between stimuli increased, to 15% of the time.  12/24/22: Loud /a/ averaged upper 80s dB, sentences "with intent" read with average lower 80s dB. SLP recorded pt to demo his speech is quieter than normal/SLP speech. SLP educated  pt on diagphram role in WNL volume speech and pt stated he oculd intermittently feel diaphragm working. SLP worked with pt on phrase responses in structured tasks and pt maintained WNL volume - stated he oculd feel diaphragm working. Little carryover to conversational speech. [name] states little carryover noted at home.   12/19/22: SLP opened up with asking pt thoughts on previous session. He expressed "s" statement. Wife and pt have been engaging in simple math since previous session and [name] believes drilling with addition until pt is successful and then starting subtraction until pt is successful, then multiplication and division with the same desired outcomes will be of assistance. SLP agreed and also pointed out that pt's attention skills play a large role in how successful pt will be with longer mathematical (and other longer non-math) tasks. SLP suggested pt and wife also have pt engage in tasks that would take longer than 3 minutes at home, and SLP provided example. Pt and wife to cont to work on math at home.   12/17/22: Loud /a/ produced in upper 80s - low 90s dB with rare cues for loudness fade. SLP worked on pt's loudness in functional math problems. Pt req'd mod-max A consistently for redirection of sustanied attention to solve simple 3-step functional math problem. SLP attempted to simplify the problem however pt had same difficulties with attention. SLP unsure if this skill will improve tremendously given pt's Parkinson's, so SLP suggested pt use calculator for math if necessary.   12/12/22: Loud /a/ generated with low 90s-upper 80s dB, with consistent cues for pt to lower pitch into speaking range. These cues were largely unsuccessful even with online piano played at speaking Hz. [name] stated his everyday sentences with low 80s dB independently.  In short conversational tasks of <5 minutes pt maintained volume of mid-upper 60s dB, successful for first 3 segments but after this pt demonstrated  mental fatigue and incr'd cognitive load re: memories - speech averaged mid 60s at this point. Encouraged pt/wife to cont to have conversations like this at home in order for pt to practice, possibly with daughter. Pt may bring in photos next session for conversation.  12/10/22: Loud /a/ generated with low 90s-upper 80s dB, with everyday sentences generated with low 80s dB independently. SLP engaged pt with word problems which pt had to solve (simple) and maintain louder, energized speech. Pt did this with 25% success on first attempt, and incr'd to 80% with second attempt. SLP told pt to practice these with wife as he and wife report he has had more challenge with working memory since fall in the autumn.  12/05/22:  Loud /a/ generated with visual cue for minimizing loudness decay with low 90s dB, with everyday sentences they were generated with low 80s dB for first 6-7 and then pt had loudness decay on the last 4. SLP attempted max cues for last 4 sentences repeatedly but pt cont'd loudness decay  on these 4 sentences. SLP then abandoned further work on everyday sentences- visual cues would not work due to short duration, and pt could not/did not hear or feel volume or effort difference between beginnings and ends of sentences despite SLP max cues. SLP worked with pt with simple one-sentence responses with more-WNL volume and pt benefited from min-mod A usually for combating loudness decay mostly due to cognitive load. SLP provided pt homework for simple sentence responses.   11/28/22: With gum today, pt had two drooling incidents over the course of the session, compared to 6-7 in previous sessions without using the gum.  Loud /a/ completed today average upper 80s- lower 90s dB. SLP used voice analyst program to improve pt's consistency with incr'd loudness, and combat pt's loudness fade. Everyday sentences with average low 80s dB, and pt remained largely at same volume as pt progressed through his list, unlike  previous session. Sentence level responses (dual meaning words in which pt provided two separate sentences using the word in the sentence) were average upper 60s to lower 70s dB. Meaning of expressions was not as good as dual meaning words with average mid 60s dB. SLP explained to [name] how to modify tasks which may be too complex cognitively for pt.  11/26/22: Loud /a/ completed today average upper 80s dB (began in low 90s fading to lower-mid 80s dB) with consistent loudness fade after 4 seconds. Better after SLP usual verbal cues for consistent loudness for last two reps. Everyday sentences with average low 80s dB, but sentences got softer as pt progressed through his list. He req'd consistent cues for loudness by the last 3 sentences. SLP worked with pt on word level responses with his average low70s dB, so SLP made task more complex requiring two word answers with incr'd cognitive load and pt average decr'd to mid-upper 60s dB. This was partly due to pt making responses too long. SLP reiterated shorter responses and this assisted pt in increasing loudness average to upper 60s dB. Pt forgot to begin to chew gum for saliva management prior to session so he inserted gum 5-10 minutes into session and req'd occasional cues to swallow to minimize saliva in oral cavity. SLP explained that next session it would be better to insert gum prior to session.   11/21/22: Fred completed loud /a/ with upper 80s dB with limited loudness fade today. Everyday sentences were forgotten at home but pt recalled 6 of his10 sentences in the low 80s dB average. It appears as if pt is practicing /a/ and sentences at home.  SLP worked with pt with sentence responses with "3 clues" task however pt had difficulty with reasoning and processing with 75% of the stimuli - wife commented that she would not have guessed the ones that were more difficult for pt either. Because of this SLP provided more concrete stimuli for homework, yet tasks  that would still work with pt's cognition. Pt witih questions if cognition can be  improved - SLP suggested we would work to help cognition be maintained as long as possible with tasks like SLP was sending home. Total A with compensations for anterior labial leakage; suggested postural changes, and gum to diminish/lessen frequency of drooling.  11/19/22: Pt reports engaging with worksheets since last session for practicing louder speech.  Loud /a/ today with much improved procedure, obvious that pt has been practicing outside ST sessions. Loudness average upper 80s dB with consistent loudness fade the last 1 second. Everyday sentences began at low-mid  80s average and ended with upper 70s dB average largely due to fatigue/decr'd attention. SLP engaged pt with sentence responses however pt expanded responses to 2-3 sentences and req'd SLP cues to shorten responses. Average loudness mid 60s dB. Pt with great difficulty with slightly more cognitive load. Cues consistently necessary for saliva management.   11/12/22: CES documented as above. Wife stated "I didn't want you to see any frustrations or negatives" - which leads SLP to believe if her assessment is an accurate representation of pt's communication effectiveness at home. Pt began loud /a/ and incr'd pitch with each successive rep, with varying procedure. SLP ascertained pt has not been performing loud /a/ as frequently as directed. SLP explained rationale for loud /a/ and pt told SLP he will put forth more effort to accomplish loud /a/ at home, with everyday sentences, and work on the worksheets in order to carryover louder/WNL speech to conversational language/speech.  Pt with consistent hesitation and pausing in conversational speech today demonstrating notable bradyphrenia. Drooling compensations targeted throughout session - pt with minimal awareness.   11/06/22: CES provided and pt and wife to bring back next session. Drooling compensation with SLP cue  x1 today during session. Loud /a/ produced with average low 80s dB, cues necessary for maintaining loudness and pitch for entire production instead of loudness and pitch fade. Everyday sentences were appropriate and were produced with cues for  inhibiting loudness fade consistently. SLP provided pt with simple verbal expression homework for him 20 minutes daily.    11/04/22: Pt and wife to fill out CES next session.  Drooling compensations were covered with pt and wife today. Pt req'd cues x3 for "stop, gather, swallow hard twice". Loud /a/ req'd usual mod cues for loudness, steady pitch, and breath from abdomen. SLP then asked pt about 10 "everyday" sentences and pt generated one in ~4 minutes. SLP told pt and wife to "team up" to think of 7-8 more by putting list given today on refrigerator and write them down as they occur in the next 2-3 days and bring to next ST session.   10/16/22: Educated patient on possible impact of Parkinson's Disease (PD) on speech, swallowing, and cognition. Denied changes in swallowing and minimal changes in cognition (not primary concern at this time). Reported decline in conversational volume resulting in wife asking for repetition and clarification. Educated patient on ways to optimize vocal intensity, including utilizing principle of intent and using louder speaking volume. Pt was responsive to SLP education and instruction with pt able to optimize volume with cues and modeling for short utterances. Also recommended using intentional swallows to manage saliva more effectively as occasional minimal drooling noted. HEP initiated and provided today. Pt and wife verbalized understanding and agreement with SLP recs.   PATIENT EDUCATION: Education details: see "today's treatment" Person educated: Patient and Spouse Education method: Explanation, Demonstration, and Handouts Education comprehension: verbalized understanding, returned demonstration, and needs further  education   GOALS: Goals reviewed with patient? Yes  SHORT TERM GOALS: Target date: 11/14/2022 (extending to 11/29/22 due to visits)  Pt will produce loud /a/ with at least low 80s dB average over 2 sessions Baseline: 11/19/22 Goal status: met  2.  Pt will demo low 70s dB average in 8/10 sentence responses over 2 sessions Baseline:  Goal status: not met  3.  Pt will demo low 70s dB average in 3-5 minutes simple conversation given occasional min A over 2 sessions  Baseline:  Goal status: not met  4.  Pt will generate abdominal breathing 80% of the time when engaging in 5 minutes simple conversation over 2 sessions Baseline:  Goal status: not targeted due to focus on loudness in simple responses  5.  Pt will effectively manage saliva with volitional swallows for 3 or less episodes given occasional min A over 2 sessions Baseline: 11/28/22 Goal status: Partially met   LONG TERM GOALS: Target date: 01/09/23   Pt will complete homework (HEP) for at least 7 consecutive days Baseline:  Goal status: Met  2.  Pt will demo WNL volume (low 70s dB average) in 8 minutes simple conversation with occasional min A over 2 sessions Baseline:  Goal status: Modified  3.  Pt will use abdominal breathing 70% of the time in 8 minutes simple conversation over 2 sessions  Baseline:  Goal status: Modified  4.  Pt will employ memory compensations (as needed) to effectively manage daily tasks given less frequent A from wife by last ST session Baseline:  Goal status: Ongoing  ASSESSMENT:  CLINICAL IMPRESSION: Patient is a 87 y.o. M who was referred for hypophonia secondary to Parkinson's Disease. See "today's treatment" for more information. During eval, pt was responsive to SLP modeling and cues to increase volume to upper 60s dB for short structured phrases. Denied overt changes in swallowing or cognition (some minimal changes - not primary concern at this time). Some rare throat clearing and  drooling exhibited today. Pt would benefit from skilled ST intervention to optimize communication effectiveness, management of saliva, and possibly cognitive compensations to aid daily functioning.   OBJECTIVE IMPAIRMENTS: Objective impairments include memory, dysarthria, and Sialorrhea . These impairments are limiting patient from household responsibilities and effectively communicating at home and in community.Factors affecting potential to achieve goals and functional outcome are medical prognosis.. Patient will benefit from skilled SLP services to address above impairments and improve overall function.  REHAB POTENTIAL: Good  PLAN:  SLP FREQUENCY: 2x/week  SLP DURATION: 12 weeks  PLANNED INTERVENTIONS: Cueing hierachy, Cognitive reorganization, Internal/external aids, Functional tasks, Multimodal communication approach, SLP instruction and feedback, Compensatory strategies, and Patient/family education    [facility], CCC-SLP 12/26/2022, 5:23 PM

## 2022-12-31 ENCOUNTER — Ambulatory Visit: Payer: Medicare Other

## 2022-12-31 DIAGNOSIS — R41841 Cognitive communication deficit: Secondary | ICD-10-CM

## 2022-12-31 DIAGNOSIS — R471 Dysarthria and anarthria: Secondary | ICD-10-CM | POA: Diagnosis not present

## 2022-12-31 NOTE — Therapy (Signed)
OUTPATIENT SPEECH LANGUAGE PATHOLOGY PARKINSON'S TREATMENT   Patient Name: [name] MRN: [id number] DOB:02-09-1936, 87 y.o., male Today's Date: 12/31/2022  PCP: Garlan Fillers MD REFERRING PROVIDER: Huston Foley, MD  END OF SESSION:  End of Session - 12/31/22 1325     Visit Number 16    Number of Visits 25    Date for SLP Re-Evaluation 01/09/23    Authorization Type Medicare A & B    SLP Start Time 1319    SLP Stop Time  1400    SLP Time Calculation (min) 41 min    Activity Tolerance Patient tolerated treatment well                   Past Medical History:  Diagnosis Date   Ankylosing spondylitis (HCC) dx'd ~ 1974   Arthritis    "back" (08/10/2015)   BENIGN PROSTATIC HYPERTROPHY, WITH OBSTRUCTION 01/15/2010   Bleeding duodenal ulcer    Bleeding esophageal ulcer    Bleeding stomach ulcer    GERD 02/22/2008   History of blood transfusion 1988   "lost ~ 1/2 of my blood volume from multiple bleeding ulcers"   History of hiatal hernia    HYPERGLYCEMIA 11/18/2007   HYPERLIPIDEMIA 02/22/2008   HYPERTENSION, UNSPECIFIED 11/10/2009   Kidney stones    Osteoarthritis    c-spine   PEPTIC ULCER DISEASE 11/17/2007   Situational depression    "son died in MVA 07/13/15"   TIA (transient ischemic attack)    "not that I know of in the past; they are trying to determine if I've had one today (08/10/2015)"   Past Surgical History:  Procedure Laterality Date   CATARACT EXTRACTION W/ INTRAOCULAR LENS  IMPLANT, BILATERAL Bilateral 2013   Patient Active Problem List   Diagnosis Date Noted   Ankylosing spondylitis of cervicothoracic region (HCC) 10/16/2016   TIA (transient ischemic attack) 08/10/2015   Carotid stenosis 08/04/2014   Hyperlipidemia 02/21/2012   BENIGN PROSTATIC HYPERTROPHY, WITH OBSTRUCTION 01/15/2010   Essential hypertension 11/10/2009   GERD 02/22/2008   NEPHROLITHIASIS, HX OF 11/17/2007     ONSET DATE: 10/02/22 (referral date); PD dx 4 years ago    REFERRING DIAG:  R49.8 (ICD-10-CM) - Hypophonia  G20.A2 (ICD-10-CM) - Parkinson's disease with fluctuating manifestations, unspecified whether dyskinesia present   THERAPY DIAG: Dysarthria and anarthria  Cognitive communication deficit  Rationale for Evaluation and Treatment: Rehabilitation  SUBJECTIVE:   SUBJECTIVE STATEMENT:  "The czar in the latter part of the 18th century was [employer]. Now prior to that there was another..." (pt, when asked to name three [place] czars)  Pt accompanied by: significant other   PERTINENT HISTORY: PD, complex medical history of peptic ulcer disease with history of upper GI bleed, TIA, depression, degenerative disc disease in the neck, osteoarthritis, kidney stones, hyperlipidemia, hypertension, history of hiatal hernia, BPH, arthritis, and ankylosing spondylitis, and parkinsonism. Referred by [facility] OP team for hypophonia. No prior ST for PD reported.   PAIN: Are you having pain? No   PATIENT GOALS: improve loudness   OBJECTIVE:    PATIENT REPORTED OUTCOME MEASURES (PROM): CES - provided 11/05/22 to wife and pt. [name] scored pt 16/40, pt scored himself 18/40 with higher scores as better effectiveness. [name] said talking on the phone was not at all effective with the rest as 2s and 3s. [name]'s answers were all 2s and 3s.  TODAY'S TREATMENT:  12/31/22: Pt and [name] cont to work with math at home. [name] agrees component of attention complicating pt's performance. Today, pt produced loud /a/ average upper 80s dB, with dual-tone after 4-5 seconds. SLP shaped pt's dual-tone back to single tone and pt able to perform /a/ like this x2. Sentences were produced with loudness fade and SLP told pt visual of paper airplane floating with very little decr in altitude and asked pt volume to mimic this. Pt loudness fade almost  eliminated after this - held with upper 70s-low 80s dB after this visualization. SLP with difficulty in getting pt to produce simple phrases for practice today - pt cont'd oververbosity in his answers leading SLP to finally just have pt recite numbers with minimal cognitive load. Pt able to maintain loudness in mid-upper 60s dB.  12/26/22: Discussion today on pt's math work at home - according to pt/wife pt is getting faster and more accurate with addition. Loud /a/ averaged upper 80s dB, sentences "with intent" read with average lower 80s dB, with cues for avoiding loudness fade. Semi-structured responses completed with average upper 60s dB. Spontaneous carryover of louder speech seen to comments between stimuli increased, to 15% of the time.  12/24/22: Loud /a/ averaged upper 80s dB, sentences "with intent" read with average lower 80s dB. SLP recorded pt to demo his speech is quieter than normal/SLP speech. SLP educated pt on diagphram role in WNL volume speech and pt stated he oculd intermittently feel diaphragm working. SLP worked with pt on phrase responses in structured tasks and pt maintained WNL volume - stated he oculd feel diaphragm working. Little carryover to conversational speech. [name] states little carryover noted at home.   12/19/22: SLP opened up with asking pt thoughts on previous session. He expressed "s" statement. Wife and pt have been engaging in simple math since previous session and [name] believes drilling with addition until pt is successful and then starting subtraction until pt is successful, then multiplication and division with the same desired outcomes will be of assistance. SLP agreed and also pointed out that pt's attention skills play a large role in how successful pt will be with longer mathematical (and other longer non-math) tasks. SLP suggested pt and wife also have pt engage in tasks that would take longer than 3 minutes at home, and SLP provided example. Pt and wife to cont  to work on math at home.   12/17/22: Loud /a/ produced in upper 80s - low 90s dB with rare cues for loudness fade. SLP worked on pt's loudness in functional math problems. Pt req'd mod-max A consistently for redirection of sustanied attention to solve simple 3-step functional math problem. SLP attempted to simplify the problem however pt had same difficulties with attention. SLP unsure if this skill will improve tremendously given pt's Parkinson's, so SLP suggested pt use calculator for math if necessary.   12/12/22: Loud /a/ generated with low 90s-upper 80s dB, with consistent cues for pt to lower pitch into speaking range. These cues were largely unsuccessful even with online piano played at speaking Hz. [name] stated his everyday sentences with low 80s dB independently.  In short conversational tasks of <5 minutes pt maintained volume of mid-upper 60s dB, successful for first 3 segments but after this pt demonstrated mental fatigue and incr'd cognitive load re: memories - speech averaged mid 60s at this point. Encouraged pt/wife to cont to have conversations like this at home in order for pt to practice, possibly with daughter. Pt may bring  in photos next session for conversation.  12/10/22: Loud /a/ generated with low 90s-upper 80s dB, with everyday sentences generated with low 80s dB independently. SLP engaged pt with word problems which pt had to solve (simple) and maintain louder, energized speech. Pt did this with 25% success on first attempt, and incr'd to 80% with second attempt. SLP told pt to practice these with wife as he and wife report he has had more challenge with working memory since fall in the autumn.  12/05/22:  Loud /a/ generated with visual cue for minimizing loudness decay with low 90s dB, with everyday sentences they were generated with low 80s dB for first 6-7 and then pt had loudness decay on the last 4. SLP attempted max cues for last 4 sentences repeatedly but pt cont'd loudness decay  on these 4 sentences. SLP then abandoned further work on everyday sentences- visual cues would not work due to short duration, and pt could not/did not hear or feel volume or effort difference between beginnings and ends of sentences despite SLP max cues. SLP worked with pt with simple one-sentence responses with more-WNL volume and pt benefited from min-mod A usually for combating loudness decay mostly due to cognitive load. SLP provided pt homework for simple sentence responses.   11/28/22: With gum today, pt had two drooling incidents over the course of the session, compared to 6-7 in previous sessions without using the gum.  Loud /a/ completed today average upper 80s- lower 90s dB. SLP used voice analyst program to improve pt's consistency with incr'd loudness, and combat pt's loudness fade. Everyday sentences with average low 80s dB, and pt remained largely at same volume as pt progressed through his list, unlike previous session. Sentence level responses (dual meaning words in which pt provided two separate sentences using the word in the sentence) were average upper 60s to lower 70s dB. Meaning of expressions was not as good as dual meaning words with average mid 60s dB. SLP explained to [name] how to modify tasks which may be too complex cognitively for pt.  11/26/22: Loud /a/ completed today average upper 80s dB (began in low 90s fading to lower-mid 80s dB) with consistent loudness fade after 4 seconds. Better after SLP usual verbal cues for consistent loudness for last two reps. Everyday sentences with average low 80s dB, but sentences got softer as pt progressed through his list. He req'd consistent cues for loudness by the last 3 sentences. SLP worked with pt on word level responses with his average low70s dB, so SLP made task more complex requiring two word answers with incr'd cognitive load and pt average decr'd to mid-upper 60s dB. This was partly due to pt making responses too long. SLP reiterated  shorter responses and this assisted pt in increasing loudness average to upper 60s dB. Pt forgot to begin to chew gum for saliva management prior to session so he inserted gum 5-10 minutes into session and req'd occasional cues to swallow to minimize saliva in oral cavity. SLP explained that next session it would be better to insert gum prior to session.   11/21/22: Fred completed loud /a/ with upper 80s dB with limited loudness fade today. Everyday sentences were forgotten at home but pt recalled 6 of his10 sentences in the low 80s dB average. It appears as if pt is practicing /a/ and sentences at home.  SLP worked with pt with sentence responses with "3 clues" task however pt had difficulty with reasoning and processing with 75%  of the stimuli - wife commented that she would not have guessed the ones that were more difficult for pt either. Because of this SLP provided more concrete stimuli for homework, yet tasks that would still work with pt's cognition. Pt witih questions if cognition can be  improved - SLP suggested we would work to help cognition be maintained as long as possible with tasks like SLP was sending home. Total A with compensations for anterior labial leakage; suggested postural changes, and gum to diminish/lessen frequency of drooling.  11/19/22: Pt reports engaging with worksheets since last session for practicing louder speech.  Loud /a/ today with much improved procedure, obvious that pt has been practicing outside ST sessions. Loudness average upper 80s dB with consistent loudness fade the last 1 second. Everyday sentences began at low-mid 80s average and ended with upper 70s dB average largely due to fatigue/decr'd attention. SLP engaged pt with sentence responses however pt expanded responses to 2-3 sentences and req'd SLP cues to shorten responses. Average loudness mid 60s dB. Pt with great difficulty with slightly more cognitive load. Cues consistently necessary for saliva  management.   11/12/22: CES documented as above. Wife stated "I didn't want you to see any frustrations or negatives" - which leads SLP to believe if her assessment is an accurate representation of pt's communication effectiveness at home. Pt began loud /a/ and incr'd pitch with each successive rep, with varying procedure. SLP ascertained pt has not been performing loud /a/ as frequently as directed. SLP explained rationale for loud /a/ and pt told SLP he will put forth more effort to accomplish loud /a/ at home, with everyday sentences, and work on the worksheets in order to carryover louder/WNL speech to conversational language/speech.  Pt with consistent hesitation and pausing in conversational speech today demonstrating notable bradyphrenia. Drooling compensations targeted throughout session - pt with minimal awareness.   11/06/22: CES provided and pt and wife to bring back next session. Drooling compensation with SLP cue x1 today during session. Loud /a/ produced with average low 80s dB, cues necessary for maintaining loudness and pitch for entire production instead of loudness and pitch fade. Everyday sentences were appropriate and were produced with cues for  inhibiting loudness fade consistently. SLP provided pt with simple verbal expression homework for him 20 minutes daily.    11/04/22: Pt and wife to fill out CES next session.  Drooling compensations were covered with pt and wife today. Pt req'd cues x3 for "stop, gather, swallow hard twice". Loud /a/ req'd usual mod cues for loudness, steady pitch, and breath from abdomen. SLP then asked pt about 10 "everyday" sentences and pt generated one in ~4 minutes. SLP told pt and wife to "team up" to think of 7-8 more by putting list given today on refrigerator and write them down as they occur in the next 2-3 days and bring to next ST session.   10/16/22: Educated patient on possible impact of Parkinson's Disease (PD) on speech, swallowing, and  cognition. Denied changes in swallowing and minimal changes in cognition (not primary concern at this time). Reported decline in conversational volume resulting in wife asking for repetition and clarification. Educated patient on ways to optimize vocal intensity, including utilizing principle of intent and using louder speaking volume. Pt was responsive to SLP education and instruction with pt able to optimize volume with cues and modeling for short utterances. Also recommended using intentional swallows to manage saliva more effectively as occasional minimal drooling noted. HEP initiated and provided  today. Pt and wife verbalized understanding and agreement with SLP recs.   PATIENT EDUCATION: Education details: see "today's treatment" Person educated: Patient and Spouse Education method: Explanation, Demonstration, and Handouts Education comprehension: verbalized understanding, returned demonstration, and needs further education   GOALS: Goals reviewed with patient? Yes  SHORT TERM GOALS: Target date: 11/14/2022 (extending to 11/29/22 due to visits)  Pt will produce loud /a/ with at least low 80s dB average over 2 sessions Baseline: 11/19/22 Goal status: met  2.  Pt will demo low 70s dB average in 8/10 sentence responses over 2 sessions Baseline:  Goal status: not met  3.  Pt will demo low 70s dB average in 3-5 minutes simple conversation given occasional min A over 2 sessions  Baseline:  Goal status: not met  4.  Pt will generate abdominal breathing 80% of the time when engaging in 5 minutes simple conversation over 2 sessions Baseline:  Goal status: not targeted due to focus on loudness in simple responses  5.  Pt will effectively manage saliva with volitional swallows for 3 or less episodes given occasional min A over 2 sessions Baseline: 11/28/22 Goal status: Partially met   LONG TERM GOALS: Target date: 01/09/23   Pt will complete homework (HEP) for at least 7 consecutive  days Baseline:  Goal status: Met  2.  Pt will demo WNL volume (low 70s dB average) in 8 minutes simple conversation with occasional min A over 2 sessions Baseline:  Goal status: Modified  3.  Pt will use abdominal breathing 70% of the time in 8 minutes simple conversation over 2 sessions  Baseline:  Goal status: Modified  4.  Pt will employ memory compensations (as needed) to effectively manage daily tasks given less frequent A from wife by last ST session Baseline:  Goal status: Ongoing  ASSESSMENT:  CLINICAL IMPRESSION: Patient is a 87 y.o. M who was referred for hypophonia secondary to Parkinson's Disease. See "today's treatment" for more information. During eval, pt was responsive to SLP modeling and cues to increase volume to upper 60s dB for short structured phrases. Denied overt changes in swallowing or cognition (some minimal changes - not primary concern at this time). Some rare throat clearing and drooling exhibited today. Pt would benefit from skilled ST intervention to optimize communication effectiveness, management of saliva, and possibly cognitive compensations to aid daily functioning.   OBJECTIVE IMPAIRMENTS: Objective impairments include memory, dysarthria, and Sialorrhea . These impairments are limiting patient from household responsibilities and effectively communicating at home and in community.Factors affecting potential to achieve goals and functional outcome are medical prognosis.. Patient will benefit from skilled SLP services to address above impairments and improve overall function.  REHAB POTENTIAL: Good  PLAN:  SLP FREQUENCY: 2x/week  SLP DURATION: 12 weeks  PLANNED INTERVENTIONS: Cueing hierachy, Cognitive reorganization, Internal/external aids, Functional tasks, Multimodal communication approach, SLP instruction and feedback, Compensatory strategies, and Patient/family education    [facility], CCC-SLP 12/31/2022, 1:26 PM

## 2023-01-02 ENCOUNTER — Ambulatory Visit: Payer: Medicare Other

## 2023-01-02 DIAGNOSIS — R471 Dysarthria and anarthria: Secondary | ICD-10-CM

## 2023-01-02 DIAGNOSIS — R82998 Other abnormal findings in urine: Secondary | ICD-10-CM | POA: Diagnosis not present

## 2023-01-02 DIAGNOSIS — R41841 Cognitive communication deficit: Secondary | ICD-10-CM | POA: Diagnosis not present

## 2023-01-02 DIAGNOSIS — D7589 Other specified diseases of blood and blood-forming organs: Secondary | ICD-10-CM | POA: Diagnosis not present

## 2023-01-02 NOTE — Therapy (Signed)
OUTPATIENT SPEECH LANGUAGE PATHOLOGY PARKINSON'S TREATMENT   Patient Name: [name] MRN: [id number] DOB:Sep 19, 1935, 87 y.o., male Today's Date: 01/02/2023  PCP: Garlan Fillers MD REFERRING PROVIDER: Huston Foley, MD  END OF SESSION:  End of Session - 01/02/23 1529     Visit Number 17    Number of Visits 25    Date for SLP Re-Evaluation 01/09/23    Authorization Type Medicare A & B    SLP Start Time 1530    SLP Stop Time  1615    SLP Time Calculation (min) 45 min    Activity Tolerance Patient tolerated treatment well                   Past Medical History:  Diagnosis Date   Ankylosing spondylitis (HCC) dx'd ~ 1974   Arthritis    "back" (08/10/2015)   BENIGN PROSTATIC HYPERTROPHY, WITH OBSTRUCTION 01/15/2010   Bleeding duodenal ulcer    Bleeding esophageal ulcer    Bleeding stomach ulcer    GERD 02/22/2008   History of blood transfusion 1988   "lost ~ 1/2 of my blood volume from multiple bleeding ulcers"   History of hiatal hernia    HYPERGLYCEMIA 11/18/2007   HYPERLIPIDEMIA 02/22/2008   HYPERTENSION, UNSPECIFIED 11/10/2009   Kidney stones    Osteoarthritis    c-spine   PEPTIC ULCER DISEASE 11/17/2007   Situational depression    "son died in MVA 2015-06-20"   TIA (transient ischemic attack)    "not that I know of in the past; they are trying to determine if I've had one today (08/10/2015)"   Past Surgical History:  Procedure Laterality Date   CATARACT EXTRACTION W/ INTRAOCULAR LENS  IMPLANT, BILATERAL Bilateral 2013   Patient Active Problem List   Diagnosis Date Noted   Ankylosing spondylitis of cervicothoracic region (HCC) 10/16/2016   TIA (transient ischemic attack) 08/10/2015   Carotid stenosis 08/04/2014   Hyperlipidemia 02/21/2012   BENIGN PROSTATIC HYPERTROPHY, WITH OBSTRUCTION 01/15/2010   Essential hypertension 11/10/2009   GERD 02/22/2008   NEPHROLITHIASIS, HX OF 11/17/2007     ONSET DATE: 10/02/22 (referral date); PD dx 4 years ago    REFERRING DIAG:  R49.8 (ICD-10-CM) - Hypophonia  G20.A2 (ICD-10-CM) - Parkinson's disease with fluctuating manifestations, unspecified whether dyskinesia present   THERAPY DIAG: Dysarthria and anarthria  Cognitive communication deficit  Rationale for Evaluation and Treatment: Rehabilitation  SUBJECTIVE:   SUBJECTIVE STATEMENT:  "Hello how are you?"  Pt accompanied by: significant other [name]  PERTINENT HISTORY: PD, complex medical history of peptic ulcer disease with history of upper GI bleed, TIA, depression, degenerative disc disease in the neck, osteoarthritis, kidney stones, hyperlipidemia, hypertension, history of hiatal hernia, BPH, arthritis, and ankylosing spondylitis, and parkinsonism. Referred by [facility] OP team for hypophonia. No prior ST for PD reported.   PAIN: Are you having pain? No   PATIENT GOALS: improve loudness   OBJECTIVE:    PATIENT REPORTED OUTCOME MEASURES (PROM): CES - provided 11/05/22 to wife and pt. [name] scored pt 16/40, pt scored himself 18/40 with higher scores as better effectiveness. [name] said talking on the phone was not at all effective with the rest as 2s and 3s. [name]'s answers were all 2s and 3s.  TODAY'S TREATMENT:  01/02/23: Pt generally better cognitively today. Loud /a/ average upper 80s dB, with dual-tone after 4 seconds. SLP attempted to shaped pt's dual-tone back to single tone abut this was difficult to do today. Everyday sentences were produced with minimal loudness fade and SLP again told pt visual of paper airplane floating with very little decr in altitude and asked pt volume to mimic this.   12/31/22: Pt and [name] cont to work with math at home. [name] agrees component of attention complicating pt's performance. Today, pt produced loud /a/ average upper 80s dB, with dual-tone after 4-5 seconds. SLP  shaped pt's dual-tone back to single tone and pt able to perform /a/ like this x2. Sentences were produced with loudness fade and SLP told pt visual of paper airplane floating with very little decr in altitude and asked pt volume to mimic this. Pt loudness fade almost eliminated after this - held with upper 70s-low 80s dB after this visualization. SLP with difficulty in getting pt to produce simple phrases for practice today - pt cont'd oververbosity in his answers leading SLP to finally just have pt recite numbers with minimal cognitive load. Pt able to maintain loudness in mid-upper 60s dB.  12/26/22: Discussion today on pt's math work at home - according to pt/wife pt is getting faster and more accurate with addition. Loud /a/ averaged upper 80s dB, sentences "with intent" read with average lower 80s dB, with cues for avoiding loudness fade. Semi-structured responses completed with average upper 60s dB. Spontaneous carryover of louder speech seen to comments between stimuli increased, to 15% of the time.  12/24/22: Loud /a/ averaged upper 80s dB, sentences "with intent" read with average lower 80s dB. SLP recorded pt to demo his speech is quieter than normal/SLP speech. SLP educated pt on diagphram role in WNL volume speech and pt stated he oculd intermittently feel diaphragm working. SLP worked with pt on phrase responses in structured tasks and pt maintained WNL volume - stated he oculd feel diaphragm working. Little carryover to conversational speech. [name] states little carryover noted at home.   12/19/22: SLP opened up with asking pt thoughts on previous session. He expressed "s" statement. Wife and pt have been engaging in simple math since previous session and [name] believes drilling with addition until pt is successful and then starting subtraction until pt is successful, then multiplication and division with the same desired outcomes will be of assistance. SLP agreed and also pointed out that pt's  attention skills play a large role in how successful pt will be with longer mathematical (and other longer non-math) tasks. SLP suggested pt and wife also have pt engage in tasks that would take longer than 3 minutes at home, and SLP provided example. Pt and wife to cont to work on math at home.   12/17/22: Loud /a/ produced in upper 80s - low 90s dB with rare cues for loudness fade. SLP worked on pt's loudness in functional math problems. Pt req'd mod-max A consistently for redirection of sustanied attention to solve simple 3-step functional math problem. SLP attempted to simplify the problem however pt had same difficulties with attention. SLP unsure if this skill will improve tremendously given pt's Parkinson's, so SLP suggested pt use calculator for math if necessary.   12/12/22: Loud /a/ generated with low 90s-upper 80s dB, with consistent cues for pt to lower pitch into speaking range. These cues were largely unsuccessful even with online piano played at speaking Hz. [name] stated his everyday sentences with low  80s dB independently.  In short conversational tasks of <5 minutes pt maintained volume of mid-upper 60s dB, successful for first 3 segments but after this pt demonstrated mental fatigue and incr'd cognitive load re: memories - speech averaged mid 60s at this point. Encouraged pt/wife to cont to have conversations like this at home in order for pt to practice, possibly with daughter. Pt may bring in photos next session for conversation.  12/10/22: Loud /a/ generated with low 90s-upper 80s dB, with everyday sentences generated with low 80s dB independently. SLP engaged pt with word problems which pt had to solve (simple) and maintain louder, energized speech. Pt did this with 25% success on first attempt, and incr'd to 80% with second attempt. SLP told pt to practice these with wife as he and wife report he has had more challenge with working memory since fall in the autumn.  12/05/22:  Loud /a/  generated with visual cue for minimizing loudness decay with low 90s dB, with everyday sentences they were generated with low 80s dB for first 6-7 and then pt had loudness decay on the last 4. SLP attempted max cues for last 4 sentences repeatedly but pt cont'd loudness decay on these 4 sentences. SLP then abandoned further work on everyday sentences- visual cues would not work due to short duration, and pt could not/did not hear or feel volume or effort difference between beginnings and ends of sentences despite SLP max cues. SLP worked with pt with simple one-sentence responses with more-WNL volume and pt benefited from min-mod A usually for combating loudness decay mostly due to cognitive load. SLP provided pt homework for simple sentence responses.   11/28/22: With gum today, pt had two drooling incidents over the course of the session, compared to 6-7 in previous sessions without using the gum.  Loud /a/ completed today average upper 80s- lower 90s dB. SLP used voice analyst program to improve pt's consistency with incr'd loudness, and combat pt's loudness fade. Everyday sentences with average low 80s dB, and pt remained largely at same volume as pt progressed through his list, unlike previous session. Sentence level responses (dual meaning words in which pt provided two separate sentences using the word in the sentence) were average upper 60s to lower 70s dB. Meaning of expressions was not as good as dual meaning words with average mid 60s dB. SLP explained to [name] how to modify tasks which may be too complex cognitively for pt.  11/26/22: Loud /a/ completed today average upper 80s dB (began in low 90s fading to lower-mid 80s dB) with consistent loudness fade after 4 seconds. Better after SLP usual verbal cues for consistent loudness for last two reps. Everyday sentences with average low 80s dB, but sentences got softer as pt progressed through his list. He req'd consistent cues for loudness by the last 3  sentences. SLP worked with pt on word level responses with his average low70s dB, so SLP made task more complex requiring two word answers with incr'd cognitive load and pt average decr'd to mid-upper 60s dB. This was partly due to pt making responses too long. SLP reiterated shorter responses and this assisted pt in increasing loudness average to upper 60s dB. Pt forgot to begin to chew gum for saliva management prior to session so he inserted gum 5-10 minutes into session and req'd occasional cues to swallow to minimize saliva in oral cavity. SLP explained that next session it would be better to insert gum prior to session.  11/21/22: Fred completed loud /a/ with upper 80s dB with limited loudness fade today. Everyday sentences were forgotten at home but pt recalled 6 of his10 sentences in the low 80s dB average. It appears as if pt is practicing /a/ and sentences at home.  SLP worked with pt with sentence responses with "3 clues" task however pt had difficulty with reasoning and processing with 75% of the stimuli - wife commented that she would not have guessed the ones that were more difficult for pt either. Because of this SLP provided more concrete stimuli for homework, yet tasks that would still work with pt's cognition. Pt witih questions if cognition can be  improved - SLP suggested we would work to help cognition be maintained as long as possible with tasks like SLP was sending home. Total A with compensations for anterior labial leakage; suggested postural changes, and gum to diminish/lessen frequency of drooling.  11/19/22: Pt reports engaging with worksheets since last session for practicing louder speech.  Loud /a/ today with much improved procedure, obvious that pt has been practicing outside ST sessions. Loudness average upper 80s dB with consistent loudness fade the last 1 second. Everyday sentences began at low-mid 80s average and ended with upper 70s dB average largely due to fatigue/decr'd  attention. SLP engaged pt with sentence responses however pt expanded responses to 2-3 sentences and req'd SLP cues to shorten responses. Average loudness mid 60s dB. Pt with great difficulty with slightly more cognitive load. Cues consistently necessary for saliva management.   11/12/22: CES documented as above. Wife stated "I didn't want you to see any frustrations or negatives" - which leads SLP to believe if her assessment is an accurate representation of pt's communication effectiveness at home. Pt began loud /a/ and incr'd pitch with each successive rep, with varying procedure. SLP ascertained pt has not been performing loud /a/ as frequently as directed. SLP explained rationale for loud /a/ and pt told SLP he will put forth more effort to accomplish loud /a/ at home, with everyday sentences, and work on the worksheets in order to carryover louder/WNL speech to conversational language/speech.  Pt with consistent hesitation and pausing in conversational speech today demonstrating notable bradyphrenia. Drooling compensations targeted throughout session - pt with minimal awareness.   11/06/22: CES provided and pt and wife to bring back next session. Drooling compensation with SLP cue x1 today during session. Loud /a/ produced with average low 80s dB, cues necessary for maintaining loudness and pitch for entire production instead of loudness and pitch fade. Everyday sentences were appropriate and were produced with cues for  inhibiting loudness fade consistently. SLP provided pt with simple verbal expression homework for him 20 minutes daily.    11/04/22: Pt and wife to fill out CES next session.  Drooling compensations were covered with pt and wife today. Pt req'd cues x3 for "stop, gather, swallow hard twice". Loud /a/ req'd usual mod cues for loudness, steady pitch, and breath from abdomen. SLP then asked pt about 10 "everyday" sentences and pt generated one in ~4 minutes. SLP told pt and wife to "team  up" to think of 7-8 more by putting list given today on refrigerator and write them down as they occur in the next 2-3 days and bring to next ST session.   10/16/22: Educated patient on possible impact of Parkinson's Disease (PD) on speech, swallowing, and cognition. Denied changes in swallowing and minimal changes in cognition (not primary concern at this time). Reported decline in conversational  volume resulting in wife asking for repetition and clarification. Educated patient on ways to optimize vocal intensity, including utilizing principle of intent and using louder speaking volume. Pt was responsive to SLP education and instruction with pt able to optimize volume with cues and modeling for short utterances. Also recommended using intentional swallows to manage saliva more effectively as occasional minimal drooling noted. HEP initiated and provided today. Pt and wife verbalized understanding and agreement with SLP recs.   PATIENT EDUCATION: Education details: see "today's treatment" Person educated: Patient and Spouse Education method: Explanation, Demonstration, and Handouts Education comprehension: verbalized understanding, returned demonstration, and needs further education   GOALS: Goals reviewed with patient? Yes  SHORT TERM GOALS: Target date: 11/14/2022 (extending to 11/29/22 due to visits)  Pt will produce loud /a/ with at least low 80s dB average over 2 sessions Baseline: 11/19/22 Goal status: met  2.  Pt will demo low 70s dB average in 8/10 sentence responses over 2 sessions Baseline:  Goal status: not met  3.  Pt will demo low 70s dB average in 3-5 minutes simple conversation given occasional min A over 2 sessions  Baseline:  Goal status: not met  4.  Pt will generate abdominal breathing 80% of the time when engaging in 5 minutes simple conversation over 2 sessions Baseline:  Goal status: not targeted due to focus on loudness in simple responses  5.  Pt will effectively  manage saliva with volitional swallows for 3 or less episodes given occasional min A over 2 sessions Baseline: 11/28/22 Goal status: Partially met   LONG TERM GOALS: Target date: 01/09/23   Pt will complete homework (HEP) for at least 7 consecutive days Baseline:  Goal status: Met  2.  Pt will demo WNL volume (low 70s dB average) in 8 minutes simple conversation with occasional min A over 2 sessions Baseline:  Goal status: Modified  3.  Pt will use abdominal breathing 70% of the time in 8 minutes simple conversation over 2 sessions  Baseline:  Goal status: Modified  4.  Pt will employ memory compensations (as needed) to effectively manage daily tasks given less frequent A from wife by last ST session Baseline:  Goal status: Ongoing  ASSESSMENT:  CLINICAL IMPRESSION: Patient is a 87 y.o. M who was referred for hypophonia secondary to Parkinson's Disease. See "today's treatment" for more information. During eval, pt was responsive to SLP modeling and cues to increase volume to upper 60s dB for short structured phrases. Denied overt changes in swallowing or cognition (some minimal changes - not primary concern at this time). Some rare throat clearing and drooling exhibited today. Pt would benefit from skilled ST intervention to optimize communication effectiveness, management of saliva, and possibly cognitive compensations to aid daily functioning.   OBJECTIVE IMPAIRMENTS: Objective impairments include memory, dysarthria, and Sialorrhea . These impairments are limiting patient from household responsibilities and effectively communicating at home and in community.Factors affecting potential to achieve goals and functional outcome are medical prognosis.. Patient will benefit from skilled SLP services to address above impairments and improve overall function.  REHAB POTENTIAL: Good  PLAN:  SLP FREQUENCY: 2x/week  SLP DURATION: 12 weeks  PLANNED INTERVENTIONS: Cueing hierachy, Cognitive  reorganization, Internal/external aids, Functional tasks, Multimodal communication approach, SLP instruction and feedback, Compensatory strategies, and Patient/family education    [facility], CCC-SLP 01/02/2023, 3:32 PM

## 2023-01-07 ENCOUNTER — Ambulatory Visit: Payer: Medicare Other

## 2023-01-07 DIAGNOSIS — R41841 Cognitive communication deficit: Secondary | ICD-10-CM

## 2023-01-07 DIAGNOSIS — R471 Dysarthria and anarthria: Secondary | ICD-10-CM

## 2023-01-07 NOTE — Therapy (Signed)
OUTPATIENT SPEECH LANGUAGE PATHOLOGY PARKINSON'S TREATMENT   Patient Name: [name] MRN: [id number] DOB:[birth year], 87 y.o., male Today's Date: 01/07/2023  PCP: Garlan Fillers MD REFERRING PROVIDER: Huston Foley, MD  END OF SESSION:  End of Session - 01/07/23 1119     Visit Number [id number]    Number of Visits 25    Date for SLP Re-Evaluation 01/09/23    Authorization Type Medicare A & B    SLP Start Time 1130    SLP Stop Time  1225    SLP Time Calculation (min) 55 min    Activity Tolerance Patient tolerated treatment well                    Past Medical History:  Diagnosis Date   Ankylosing spondylitis (HCC) dx'd ~ 1974   Arthritis    "back" (08/10/2015)   BENIGN PROSTATIC HYPERTROPHY, WITH OBSTRUCTION 01/15/2010   Bleeding duodenal ulcer    Bleeding esophageal ulcer    Bleeding stomach ulcer    GERD 02/22/2008   History of blood transfusion 1988   "lost ~ 1/2 of my blood volume from multiple bleeding ulcers"   History of hiatal hernia    HYPERGLYCEMIA 11/18/2007   HYPERLIPIDEMIA 02/22/2008   HYPERTENSION, UNSPECIFIED 11/10/2009   Kidney stones    Osteoarthritis    c-spine   PEPTIC ULCER DISEASE 11/17/2007   Situational depression    "son died in MVA 07/16/2015"   TIA (transient ischemic attack)    "not that I know of in the past; they are trying to determine if I've had one today (08/10/2015)"   Past Surgical History:  Procedure Laterality Date   CATARACT EXTRACTION W/ INTRAOCULAR LENS  IMPLANT, BILATERAL Bilateral 2013   Patient Active Problem List   Diagnosis Date Noted   Ankylosing spondylitis of cervicothoracic region (HCC) 10/16/2016   TIA (transient ischemic attack) 08/10/2015   Carotid stenosis 08/04/2014   Hyperlipidemia 02/21/2012   BENIGN PROSTATIC HYPERTROPHY, WITH OBSTRUCTION 01/15/2010   Essential hypertension 11/10/2009   GERD 02/22/2008   NEPHROLITHIASIS, HX OF 11/17/2007     ONSET DATE: 10/02/22 (referral date); PD dx 4 years ago    REFERRING DIAG:  R49.8 (ICD-10-CM) - Hypophonia  G20.A2 (ICD-10-CM) - Parkinson's disease with fluctuating manifestations, unspecified whether dyskinesia present   THERAPY DIAG: Dysarthria and anarthria  Cognitive communication deficit  Rationale for Evaluation and Treatment: Rehabilitation  SUBJECTIVE:   SUBJECTIVE STATEMENT: "My loudness is better" Pt accompanied by: significant other [name]  PERTINENT HISTORY: PD, complex medical history of peptic ulcer disease with history of upper GI bleed, TIA, depression, degenerative disc disease in the neck, osteoarthritis, kidney stones, hyperlipidemia, hypertension, history of hiatal hernia, BPH, arthritis, and ankylosing spondylitis, and parkinsonism. Referred by [facility] OP team for hypophonia. No prior ST for PD reported.   PAIN: Are you having pain? No  PATIENT GOALS: improve loudness   OBJECTIVE:   PATIENT REPORTED OUTCOME MEASURES (PROM): CES - provided 11/05/22 to wife and pt. [name] scored pt 16/40, pt scored himself 18/40 with higher scores as better effectiveness. [name] said talking on the phone was not at all effective with the rest as 2s and 3s. [name]'s answers were all 2s and 3s.  TODAY'S TREATMENT:  01/07/23: Entered with upper fading to mid 60s dB conversational volume. Loud /a/ averaged upper 80s with initial cues to increase volume and breath support. Oral reading of everyday sentences was WNL (low 70s dB) with side comments fading to mid 60s dB. Educated importance of eye contact and elevating chin to aid saliva management and voice projection. Occasional increasing to usual verbal and visual cues required to optimize volume, maintenance of eye contact, and volitional swallows in short structured conversations. Discussed ways to integrate recommended exercises and strategies in daily practice.    01/02/23: Pt generally better cognitively today. Loud /a/ average upper 80s dB, with dual-tone after 4 seconds. SLP attempted to shaped pt's dual-tone back to single tone abut this was difficult to do today. Everyday sentences were produced with minimal loudness fade and SLP again told pt visual of paper airplane floating with very little decr in altitude and asked pt volume to mimic this.   12/31/22: Pt and [name] cont to work with math at home. [name] agrees component of attention complicating pt's performance. Today, pt produced loud /a/ average upper 80s dB, with dual-tone after 4-5 seconds. SLP shaped pt's dual-tone back to single tone and pt able to perform /a/ like this x2. Sentences were produced with loudness fade and SLP told pt visual of paper airplane floating with very little decr in altitude and asked pt volume to mimic this. Pt loudness fade almost eliminated after this - held with upper 70s-low 80s dB after this visualization. SLP with difficulty in getting pt to produce simple phrases for practice today - pt cont'd oververbosity in his answers leading SLP to finally just have pt recite numbers with minimal cognitive load. Pt able to maintain loudness in mid-upper 60s dB.  12/26/22: Discussion today on pt's math work at home - according to pt/wife pt is getting faster and more accurate with addition. Loud /a/ averaged upper 80s dB, sentences "with intent" read with average lower 80s dB, with cues for avoiding loudness fade. Semi-structured responses completed with average upper 60s dB. Spontaneous carryover of louder speech seen to comments between stimuli increased, to 15% of the time.  12/24/22: Loud /a/ averaged upper 80s dB, sentences "with intent" read with average lower 80s dB. SLP recorded pt to demo his speech is quieter than normal/SLP speech. SLP educated pt on diagphram role in WNL volume speech and pt stated he oculd intermittently feel diaphragm working. SLP worked with pt on phrase  responses in structured tasks and pt maintained WNL volume - stated he oculd feel diaphragm working. Little carryover to conversational speech. [name] states little carryover noted at home.   12/19/22: SLP opened up with asking pt thoughts on previous session. He expressed "s" statement. Wife and pt have been engaging in simple math since previous session and [name] believes drilling with addition until pt is successful and then starting subtraction until pt is successful, then multiplication and division with the same desired outcomes will be of assistance. SLP agreed and also pointed out that pt's attention skills play a large role in how successful pt will be with longer mathematical (and other longer non-math) tasks. SLP suggested pt and wife also have pt engage in tasks that would take longer than 3 minutes at home, and SLP provided example. Pt and wife to cont to work on math at home.   12/17/22: Loud /a/ produced in upper 80s - low 90s dB with rare cues for loudness fade. SLP worked on pt's loudness in functional math  problems. Pt req'd mod-max A consistently for redirection of sustanied attention to solve simple 3-step functional math problem. SLP attempted to simplify the problem however pt had same difficulties with attention. SLP unsure if this skill will improve tremendously given pt's Parkinson's, so SLP suggested pt use calculator for math if necessary.   12/12/22: Loud /a/ generated with low 90s-upper 80s dB, with consistent cues for pt to lower pitch into speaking range. These cues were largely unsuccessful even with online piano played at speaking Hz. [name] stated his everyday sentences with low 80s dB independently.  In short conversational tasks of <5 minutes pt maintained volume of mid-upper 60s dB, successful for first 3 segments but after this pt demonstrated mental fatigue and incr'd cognitive load re: memories - speech averaged mid 60s at this point. Encouraged pt/wife to cont to have  conversations like this at home in order for pt to practice, possibly with daughter. Pt may bring in photos next session for conversation.  12/10/22: Loud /a/ generated with low 90s-upper 80s dB, with everyday sentences generated with low 80s dB independently. SLP engaged pt with word problems which pt had to solve (simple) and maintain louder, energized speech. Pt did this with 25% success on first attempt, and incr'd to 80% with second attempt. SLP told pt to practice these with wife as he and wife report he has had more challenge with working memory since fall in the autumn.  12/05/22:  Loud /a/ generated with visual cue for minimizing loudness decay with low 90s dB, with everyday sentences they were generated with low 80s dB for first 6-7 and then pt had loudness decay on the last 4. SLP attempted max cues for last 4 sentences repeatedly but pt cont'd loudness decay on these 4 sentences. SLP then abandoned further work on everyday sentences- visual cues would not work due to short duration, and pt could not/did not hear or feel volume or effort difference between beginnings and ends of sentences despite SLP max cues. SLP worked with pt with simple one-sentence responses with more-WNL volume and pt benefited from min-mod A usually for combating loudness decay mostly due to cognitive load. SLP provided pt homework for simple sentence responses.   11/28/22: With gum today, pt had two drooling incidents over the course of the session, compared to 6-7 in previous sessions without using the gum.  Loud /a/ completed today average upper 80s- lower 90s dB. SLP used voice analyst program to improve pt's consistency with incr'd loudness, and combat pt's loudness fade. Everyday sentences with average low 80s dB, and pt remained largely at same volume as pt progressed through his list, unlike previous session. Sentence level responses (dual meaning words in which pt provided two separate sentences using the word in the  sentence) were average upper 60s to lower 70s dB. Meaning of expressions was not as good as dual meaning words with average mid 60s dB. SLP explained to [name] how to modify tasks which may be too complex cognitively for pt.  11/26/22: Loud /a/ completed today average upper 80s dB (began in low 90s fading to lower-mid 80s dB) with consistent loudness fade after 4 seconds. Better after SLP usual verbal cues for consistent loudness for last two reps. Everyday sentences with average low 80s dB, but sentences got softer as pt progressed through his list. He req'd consistent cues for loudness by the last 3 sentences. SLP worked with pt on word level responses with his average low70s dB, so SLP made task  more complex requiring two word answers with incr'd cognitive load and pt average decr'd to mid-upper 60s dB. This was partly due to pt making responses too long. SLP reiterated shorter responses and this assisted pt in increasing loudness average to upper 60s dB. Pt forgot to begin to chew gum for saliva management prior to session so he inserted gum 5-10 minutes into session and req'd occasional cues to swallow to minimize saliva in oral cavity. SLP explained that next session it would be better to insert gum prior to session.   11/21/22: Fred completed loud /a/ with upper 80s dB with limited loudness fade today. Everyday sentences were forgotten at home but pt recalled 6 of his10 sentences in the low 80s dB average. It appears as if pt is practicing /a/ and sentences at home.  SLP worked with pt with sentence responses with "3 clues" task however pt had difficulty with reasoning and processing with 75% of the stimuli - wife commented that she would not have guessed the ones that were more difficult for pt either. Because of this SLP provided more concrete stimuli for homework, yet tasks that would still work with pt's cognition. Pt witih questions if cognition can be  improved - SLP suggested we would work to help  cognition be maintained as long as possible with tasks like SLP was sending home. Total A with compensations for anterior labial leakage; suggested postural changes, and gum to diminish/lessen frequency of drooling.  11/19/22: Pt reports engaging with worksheets since last session for practicing louder speech.  Loud /a/ today with much improved procedure, obvious that pt has been practicing outside ST sessions. Loudness average upper 80s dB with consistent loudness fade the last 1 second. Everyday sentences began at low-mid 80s average and ended with upper 70s dB average largely due to fatigue/decr'd attention. SLP engaged pt with sentence responses however pt expanded responses to 2-3 sentences and req'd SLP cues to shorten responses. Average loudness mid 60s dB. Pt with great difficulty with slightly more cognitive load. Cues consistently necessary for saliva management.   11/12/22: CES documented as above. Wife stated "I didn't want you to see any frustrations or negatives" - which leads SLP to believe if her assessment is an accurate representation of pt's communication effectiveness at home. Pt began loud /a/ and incr'd pitch with each successive rep, with varying procedure. SLP ascertained pt has not been performing loud /a/ as frequently as directed. SLP explained rationale for loud /a/ and pt told SLP he will put forth more effort to accomplish loud /a/ at home, with everyday sentences, and work on the worksheets in order to carryover louder/WNL speech to conversational language/speech.  Pt with consistent hesitation and pausing in conversational speech today demonstrating notable bradyphrenia. Drooling compensations targeted throughout session - pt with minimal awareness.   11/06/22: CES provided and pt and wife to bring back next session. Drooling compensation with SLP cue x1 today during session. Loud /a/ produced with average low 80s dB, cues necessary for maintaining loudness and pitch for entire  production instead of loudness and pitch fade. Everyday sentences were appropriate and were produced with cues for  inhibiting loudness fade consistently. SLP provided pt with simple verbal expression homework for him 20 minutes daily.    11/04/22: Pt and wife to fill out CES next session.  Drooling compensations were covered with pt and wife today. Pt req'd cues x3 for "stop, gather, swallow hard twice". Loud /a/ req'd usual mod cues for loudness, steady  pitch, and breath from abdomen. SLP then asked pt about 10 "everyday" sentences and pt generated one in ~4 minutes. SLP told pt and wife to "team up" to think of 7-8 more by putting list given today on refrigerator and write them down as they occur in the next 2-3 days and bring to next ST session.   10/16/22: Educated patient on possible impact of Parkinson's Disease (PD) on speech, swallowing, and cognition. Denied changes in swallowing and minimal changes in cognition (not primary concern at this time). Reported decline in conversational volume resulting in wife asking for repetition and clarification. Educated patient on ways to optimize vocal intensity, including utilizing principle of intent and using louder speaking volume. Pt was responsive to SLP education and instruction with pt able to optimize volume with cues and modeling for short utterances. Also recommended using intentional swallows to manage saliva more effectively as occasional minimal drooling noted. HEP initiated and provided today. Pt and wife verbalized understanding and agreement with SLP recs.   PATIENT EDUCATION: Education details: see "today's treatment" Person educated: Patient and Spouse Education method: Explanation, Demonstration, and Handouts Education comprehension: verbalized understanding, returned demonstration, and needs further education   GOALS: Goals reviewed with patient? Yes  SHORT TERM GOALS: Target date: 11/14/2022 (extending to 11/29/22 due to visits)  Pt  will produce loud /a/ with at least low 80s dB average over 2 sessions Baseline: 11/19/22 Goal status: met  2.  Pt will demo low 70s dB average in 8/10 sentence responses over 2 sessions Baseline:  Goal status: not met  3.  Pt will demo low 70s dB average in 3-5 minutes simple conversation given occasional min A over 2 sessions  Baseline:  Goal status: not met  4.  Pt will generate abdominal breathing 80% of the time when engaging in 5 minutes simple conversation over 2 sessions Baseline:  Goal status: not targeted due to focus on loudness in simple responses  5.  Pt will effectively manage saliva with volitional swallows for 3 or less episodes given occasional min A over 2 sessions Baseline: 11/28/22 Goal status: Partially met   LONG TERM GOALS: Target date: 01/09/23   Pt will complete homework (HEP) for at least 7 consecutive days Baseline:  Goal status: Met  2.  Pt will demo WNL volume (low 70s dB average) in 8 minutes simple conversation with occasional min A over 2 sessions Baseline:  Goal status: Modified  3.  Pt will use abdominal breathing 70% of the time in 8 minutes simple conversation over 2 sessions  Baseline:  Goal status: Modified  4.  Pt will employ memory compensations (as needed) to effectively manage daily tasks given less frequent A from wife by last ST session Baseline:  Goal status: Ongoing  ASSESSMENT:  CLINICAL IMPRESSION: Patient is a 87 y.o. M who was referred for hypophonia secondary to Parkinson's Disease. Some subjective and objective improvements in vocal intensity exhibited. See "today's treatment" for more information. Pt would benefit from skilled ST intervention to optimize communication effectiveness, management of saliva, and possibly cognitive compensations to aid daily functioning.   OBJECTIVE IMPAIRMENTS: Objective impairments include memory, dysarthria, and Sialorrhea . These impairments are limiting patient from household  responsibilities and effectively communicating at home and in community.Factors affecting potential to achieve goals and functional outcome are medical prognosis.. Patient will benefit from skilled SLP services to address above impairments and improve overall function.  REHAB POTENTIAL: Good  PLAN:  SLP FREQUENCY: 2x/week  SLP DURATION: 12 weeks  PLANNED INTERVENTIONS: Cueing hierachy, Cognitive reorganization, Internal/external aids, Functional tasks, Multimodal communication approach, SLP instruction and feedback, Compensatory strategies, and Patient/family education    Gracy Racer, CCC-SLP 01/07/2023, 12:25 PM

## 2023-01-08 NOTE — Therapy (Unsigned)
OUTPATIENT SPEECH LANGUAGE PATHOLOGY PARKINSON'S TREATMENT   Patient Name: [name] MRN: [id number] DOB:1936/04/11, 87 y.o., male Today's Date: 01/08/2023  PCP: Garlan Fillers MD REFERRING PROVIDER: Huston Foley, MD  END OF SESSION:     SPEECH THERAPY DISCHARGE SUMMARY  Visits from Start of Care: 54  Current functional level related to goals / functional outcomes: Some increased conversational volume exhibited compared to prior baseline. Benefits from intermittent cues in therapy and at home to optimize carryover. Recommend HEP 1-2x/day to optimize carryover and maintenance.    Remaining deficits: PD   Education / Equipment: Dysarthria strategies, functional activities, cueing hierarchy, caregiver education    Patient agrees to discharge. Patient goals were {OP Goals:25702::"met"}. Patient is being discharged due to {OP Discharge Reasons:25703::"meeting the stated rehab goals."}.         Past Medical History:  Diagnosis Date   Ankylosing spondylitis (HCC) dx'd ~ 1974   Arthritis    "back" (08/10/2015)   BENIGN PROSTATIC HYPERTROPHY, WITH OBSTRUCTION 01/15/2010   Bleeding duodenal ulcer    Bleeding esophageal ulcer    Bleeding stomach ulcer    GERD 02/22/2008   History of blood transfusion 1988   "lost ~ 1/2 of my blood volume from multiple bleeding ulcers"   History of hiatal hernia    HYPERGLYCEMIA 11/18/2007   HYPERLIPIDEMIA 02/22/2008   HYPERTENSION, UNSPECIFIED 11/10/2009   Kidney stones    Osteoarthritis    c-spine   PEPTIC ULCER DISEASE 11/17/2007   Situational depression    "son died in MVA 2015-07-03"   TIA (transient ischemic attack)    "not that I know of in the past; they are trying to determine if I've had one today (08/10/2015)"   Past Surgical History:  Procedure Laterality Date   CATARACT EXTRACTION W/ INTRAOCULAR LENS  IMPLANT, BILATERAL Bilateral 2013   Patient Active Problem List   Diagnosis Date Noted   Ankylosing spondylitis of  cervicothoracic region (HCC) 10/16/2016   TIA (transient ischemic attack) 08/10/2015   Carotid stenosis 08/04/2014   Hyperlipidemia 02/21/2012   BENIGN PROSTATIC HYPERTROPHY, WITH OBSTRUCTION 01/15/2010   Essential hypertension 11/10/2009   GERD 02/22/2008   NEPHROLITHIASIS, HX OF 11/17/2007     ONSET DATE: 10/02/22 (referral date); PD dx 4 years ago   REFERRING DIAG:  R49.8 (ICD-10-CM) - Hypophonia  G20.A2 (ICD-10-CM) - Parkinson's disease with fluctuating manifestations, unspecified whether dyskinesia present   THERAPY DIAG: No diagnosis found.  Rationale for Evaluation and Treatment: Rehabilitation  SUBJECTIVE:   SUBJECTIVE STATEMENT: "*** Pt accompanied by: significant other [name]  PERTINENT HISTORY: PD, complex medical history of peptic ulcer disease with history of upper GI bleed, TIA, depression, degenerative disc disease in the neck, osteoarthritis, kidney stones, hyperlipidemia, hypertension, history of hiatal hernia, BPH, arthritis, and ankylosing spondylitis, and parkinsonism. Referred by [facility] OP team for hypophonia. No prior ST for PD reported.   PAIN: Are you having pain? No  PATIENT GOALS: improve loudness   OBJECTIVE:   PATIENT REPORTED OUTCOME MEASURES (PROM): CES - provided 11/05/22 to wife and pt. [name] scored pt 16/40, pt scored himself 18/40 with higher scores as better effectiveness. [name] said talking on the phone was not at all effective with the rest as 2s and 3s. [name]'s answers were all 2s and 3s.  TODAY'S TREATMENT:  01/09/23:  01/07/23: Entered with upper fading to mid 60s dB conversational volume. Loud /a/ averaged upper 80s with initial cues to increase volume and breath support. Oral reading of everyday sentences was WNL (low 70s dB) with side comments fading to mid 60s dB. Educated importance of eye contact and  elevating chin to aid saliva management and voice projection. Occasional increasing to usual verbal and visual cues required to optimize volume, maintenance of eye contact, and volitional swallows in short structured conversations. Discussed ways to integrate recommended exercises and strategies in daily practice.   01/02/23: Pt generally better cognitively today. Loud /a/ average upper 80s dB, with dual-tone after 4 seconds. SLP attempted to shaped pt's dual-tone back to single tone abut this was difficult to do today. Everyday sentences were produced with minimal loudness fade and SLP again told pt visual of paper airplane floating with very little decr in altitude and asked pt volume to mimic this.   12/31/22: Pt and [name] cont to work with math at home. [name] agrees component of attention complicating pt's performance. Today, pt produced loud /a/ average upper 80s dB, with dual-tone after 4-5 seconds. SLP shaped pt's dual-tone back to single tone and pt able to perform /a/ like this x2. Sentences were produced with loudness fade and SLP told pt visual of paper airplane floating with very little decr in altitude and asked pt volume to mimic this. Pt loudness fade almost eliminated after this - held with upper 70s-low 80s dB after this visualization. SLP with difficulty in getting pt to produce simple phrases for practice today - pt cont'd oververbosity in his answers leading SLP to finally just have pt recite numbers with minimal cognitive load. Pt able to maintain loudness in mid-upper 60s dB.  12/26/22: Discussion today on pt's math work at home - according to pt/wife pt is getting faster and more accurate with addition. Loud /a/ averaged upper 80s dB, sentences "with intent" read with average lower 80s dB, with cues for avoiding loudness fade. Semi-structured responses completed with average upper 60s dB. Spontaneous carryover of louder speech seen to comments between stimuli increased, to 15% of the  time.  12/24/22: Loud /a/ averaged upper 80s dB, sentences "with intent" read with average lower 80s dB. SLP recorded pt to demo his speech is quieter than normal/SLP speech. SLP educated pt on diagphram role in WNL volume speech and pt stated he oculd intermittently feel diaphragm working. SLP worked with pt on phrase responses in structured tasks and pt maintained WNL volume - stated he oculd feel diaphragm working. Little carryover to conversational speech. [name] states little carryover noted at home.   12/19/22: SLP opened up with asking pt thoughts on previous session. He expressed "s" statement. Wife and pt have been engaging in simple math since previous session and [name] believes drilling with addition until pt is successful and then starting subtraction until pt is successful, then multiplication and division with the same desired outcomes will be of assistance. SLP agreed and also pointed out that pt's attention skills play a large role in how successful pt will be with longer mathematical (and other longer non-math) tasks. SLP suggested pt and wife also have pt engage in tasks that would take longer than 3 minutes at home, and SLP provided example. Pt and wife to cont to work on math at home.   12/17/22: Loud /a/ produced in upper 80s - low 90s dB with rare cues for loudness fade. SLP worked on pt's loudness in  functional math problems. Pt req'd mod-max A consistently for redirection of sustanied attention to solve simple 3-step functional math problem. SLP attempted to simplify the problem however pt had same difficulties with attention. SLP unsure if this skill will improve tremendously given pt's Parkinson's, so SLP suggested pt use calculator for math if necessary.   12/12/22: Loud /a/ generated with low 90s-upper 80s dB, with consistent cues for pt to lower pitch into speaking range. These cues were largely unsuccessful even with online piano played at speaking Hz. [name] stated his everyday  sentences with low 80s dB independently.  In short conversational tasks of <5 minutes pt maintained volume of mid-upper 60s dB, successful for first 3 segments but after this pt demonstrated mental fatigue and incr'd cognitive load re: memories - speech averaged mid 60s at this point. Encouraged pt/wife to cont to have conversations like this at home in order for pt to practice, possibly with daughter. Pt may bring in photos next session for conversation.  12/10/22: Loud /a/ generated with low 90s-upper 80s dB, with everyday sentences generated with low 80s dB independently. SLP engaged pt with word problems which pt had to solve (simple) and maintain louder, energized speech. Pt did this with 25% success on first attempt, and incr'd to 80% with second attempt. SLP told pt to practice these with wife as he and wife report he has had more challenge with working memory since fall in the autumn.  12/05/22:  Loud /a/ generated with visual cue for minimizing loudness decay with low 90s dB, with everyday sentences they were generated with low 80s dB for first 6-7 and then pt had loudness decay on the last 4. SLP attempted max cues for last 4 sentences repeatedly but pt cont'd loudness decay on these 4 sentences. SLP then abandoned further work on everyday sentences- visual cues would not work due to short duration, and pt could not/did not hear or feel volume or effort difference between beginnings and ends of sentences despite SLP max cues. SLP worked with pt with simple one-sentence responses with more-WNL volume and pt benefited from min-mod A usually for combating loudness decay mostly due to cognitive load. SLP provided pt homework for simple sentence responses.   11/28/22: With gum today, pt had two drooling incidents over the course of the session, compared to 6-7 in previous sessions without using the gum.  Loud /a/ completed today average upper 80s- lower 90s dB. SLP used voice analyst program to improve pt's  consistency with incr'd loudness, and combat pt's loudness fade. Everyday sentences with average low 80s dB, and pt remained largely at same volume as pt progressed through his list, unlike previous session. Sentence level responses (dual meaning words in which pt provided two separate sentences using the word in the sentence) were average upper 60s to lower 70s dB. Meaning of expressions was not as good as dual meaning words with average mid 60s dB. SLP explained to [name] how to modify tasks which may be too complex cognitively for pt.  11/26/22: Loud /a/ completed today average upper 80s dB (began in low 90s fading to lower-mid 80s dB) with consistent loudness fade after 4 seconds. Better after SLP usual verbal cues for consistent loudness for last two reps. Everyday sentences with average low 80s dB, but sentences got softer as pt progressed through his list. He req'd consistent cues for loudness by the last 3 sentences. SLP worked with pt on word level responses with his average low70s dB, so SLP  made task more complex requiring two word answers with incr'd cognitive load and pt average decr'd to mid-upper 60s dB. This was partly due to pt making responses too long. SLP reiterated shorter responses and this assisted pt in increasing loudness average to upper 60s dB. Pt forgot to begin to chew gum for saliva management prior to session so he inserted gum 5-10 minutes into session and req'd occasional cues to swallow to minimize saliva in oral cavity. SLP explained that next session it would be better to insert gum prior to session.   11/21/22: Fred completed loud /a/ with upper 80s dB with limited loudness fade today. Everyday sentences were forgotten at home but pt recalled 6 of his10 sentences in the low 80s dB average. It appears as if pt is practicing /a/ and sentences at home.  SLP worked with pt with sentence responses with "3 clues" task however pt had difficulty with reasoning and processing with 75%  of the stimuli - wife commented that she would not have guessed the ones that were more difficult for pt either. Because of this SLP provided more concrete stimuli for homework, yet tasks that would still work with pt's cognition. Pt witih questions if cognition can be  improved - SLP suggested we would work to help cognition be maintained as long as possible with tasks like SLP was sending home. Total A with compensations for anterior labial leakage; suggested postural changes, and gum to diminish/lessen frequency of drooling.  11/19/22: Pt reports engaging with worksheets since last session for practicing louder speech.  Loud /a/ today with much improved procedure, obvious that pt has been practicing outside ST sessions. Loudness average upper 80s dB with consistent loudness fade the last 1 second. Everyday sentences began at low-mid 80s average and ended with upper 70s dB average largely due to fatigue/decr'd attention. SLP engaged pt with sentence responses however pt expanded responses to 2-3 sentences and req'd SLP cues to shorten responses. Average loudness mid 60s dB. Pt with great difficulty with slightly more cognitive load. Cues consistently necessary for saliva management.   11/12/22: CES documented as above. Wife stated "I didn't want you to see any frustrations or negatives" - which leads SLP to believe if her assessment is an accurate representation of pt's communication effectiveness at home. Pt began loud /a/ and incr'd pitch with each successive rep, with varying procedure. SLP ascertained pt has not been performing loud /a/ as frequently as directed. SLP explained rationale for loud /a/ and pt told SLP he will put forth more effort to accomplish loud /a/ at home, with everyday sentences, and work on the worksheets in order to carryover louder/WNL speech to conversational language/speech.  Pt with consistent hesitation and pausing in conversational speech today demonstrating notable  bradyphrenia. Drooling compensations targeted throughout session - pt with minimal awareness.   11/06/22: CES provided and pt and wife to bring back next session. Drooling compensation with SLP cue x1 today during session. Loud /a/ produced with average low 80s dB, cues necessary for maintaining loudness and pitch for entire production instead of loudness and pitch fade. Everyday sentences were appropriate and were produced with cues for  inhibiting loudness fade consistently. SLP provided pt with simple verbal expression homework for him 20 minutes daily.    11/04/22: Pt and wife to fill out CES next session.  Drooling compensations were covered with pt and wife today. Pt req'd cues x3 for "stop, gather, swallow hard twice". Loud /a/ req'd usual mod cues for  loudness, steady pitch, and breath from abdomen. SLP then asked pt about 10 "everyday" sentences and pt generated one in ~4 minutes. SLP told pt and wife to "team up" to think of 7-8 more by putting list given today on refrigerator and write them down as they occur in the next 2-3 days and bring to next ST session.   10/16/22: Educated patient on possible impact of Parkinson's Disease (PD) on speech, swallowing, and cognition. Denied changes in swallowing and minimal changes in cognition (not primary concern at this time). Reported decline in conversational volume resulting in wife asking for repetition and clarification. Educated patient on ways to optimize vocal intensity, including utilizing principle of intent and using louder speaking volume. Pt was responsive to SLP education and instruction with pt able to optimize volume with cues and modeling for short utterances. Also recommended using intentional swallows to manage saliva more effectively as occasional minimal drooling noted. HEP initiated and provided today. Pt and wife verbalized understanding and agreement with SLP recs.   PATIENT EDUCATION: Education details: see "today's  treatment" Person educated: Patient and Spouse Education method: Explanation, Demonstration, and Handouts Education comprehension: verbalized understanding, returned demonstration, and needs further education   GOALS: Goals reviewed with patient? Yes  SHORT TERM GOALS: Target date: 11/14/2022 (extending to 11/29/22 due to visits)  Pt will produce loud /a/ with at least low 80s dB average over 2 sessions Baseline: 11/19/22 Goal status: met  2.  Pt will demo low 70s dB average in 8/10 sentence responses over 2 sessions Baseline:  Goal status: not met  3.  Pt will demo low 70s dB average in 3-5 minutes simple conversation given occasional min A over 2 sessions  Baseline:  Goal status: not met  4.  Pt will generate abdominal breathing 80% of the time when engaging in 5 minutes simple conversation over 2 sessions Baseline:  Goal status: not targeted due to focus on loudness in simple responses  5.  Pt will effectively manage saliva with volitional swallows for 3 or less episodes given occasional min A over 2 sessions Baseline: 11/28/22 Goal status: Partially met   LONG TERM GOALS: Target date: 01/09/23   Pt will complete homework (HEP) for at least 7 consecutive days Baseline:  Goal status: Met  2.  Pt will demo WNL volume (low 70s dB average) in 8 minutes simple conversation with occasional min A over 2 sessions Baseline:  Goal status: Modified  3.  Pt will use abdominal breathing 70% of the time in 8 minutes simple conversation over 2 sessions  Baseline:  Goal status: Modified  4.  Pt will employ memory compensations (as needed) to effectively manage daily tasks given less frequent A from wife by last ST session Baseline:  Goal status: Ongoing  ASSESSMENT:  CLINICAL IMPRESSION: Patient is a 87 y.o. M who was referred for hypophonia secondary to Parkinson's Disease. Some subjective and objective improvements in vocal intensity exhibited. See "today's treatment" for more  information. Pt would benefit from skilled ST intervention to optimize communication effectiveness, management of saliva, and possibly cognitive compensations to aid daily functioning.   OBJECTIVE IMPAIRMENTS: Objective impairments include memory, dysarthria, and Sialorrhea . These impairments are limiting patient from household responsibilities and effectively communicating at home and in community.Factors affecting potential to achieve goals and functional outcome are medical prognosis.. Patient will benefit from skilled SLP services to address above impairments and improve overall function.  REHAB POTENTIAL: Good  PLAN:  SLP FREQUENCY: 2x/week  SLP DURATION:  12 weeks  PLANNED INTERVENTIONS: Cueing hierachy, Cognitive reorganization, Internal/external aids, Functional tasks, Multimodal communication approach, SLP instruction and feedback, Compensatory strategies, and Patient/family education    Gracy Racer, CCC-SLP 01/08/2023, 11:56 AM

## 2023-01-09 ENCOUNTER — Ambulatory Visit: Payer: Medicare Other

## 2023-01-09 DIAGNOSIS — R41841 Cognitive communication deficit: Secondary | ICD-10-CM

## 2023-01-09 DIAGNOSIS — R471 Dysarthria and anarthria: Secondary | ICD-10-CM

## 2023-01-20 ENCOUNTER — Other Ambulatory Visit: Payer: Self-pay | Admitting: Cardiology

## 2023-01-29 NOTE — Progress Notes (Signed)
Cardiology Office Note:    Date:  01/30/2023   ID:  [name], DOB [birth year], MRN [id number]  PCP:  Garlan Fillers, MD   [facility] Providers Cardiologist:  Peter Swaziland, MD {  Referring MD: Garlan Fillers, MD   Chief complaint: Follow up for hypertension   History of Present Illness:    [name] is a 87 y.o. male with a hx of hyperlipidemia, HTN, GERD, Parkinson's disease, ankylosing spondilitis, TIA and carotid disease.   In December of 2016 he was admitted with possible TIA his carotid Dopplers at that time showed only mild disease.  On Dopplers in 2015 he was noted to have 40 to 59% RICA stenosis. His most recent Carotid doppler in 2018 showed mild nonobstructive carotid stenosis. Last echo in 2016 showed EF 60 to 65%, aortic vavle had mildly thickened leaflets, MV and TV had trivial regurgitation. He was last seen by Dr. Swaziland in April 2023 at that time was doing very well.   Today he presents with his wife. He reports three episodes of left sided chest pressure with exertion lasting a few minutes in the last month, relieved with rest. He denies any associated symptoms.  He notes occasional lightheadedness, improves with seated rest, he denies syncope or presyncope. Notes occasional slight bilateral lower extremity edema. He denies palpitations, PND, dyspnea or orthopnea.  He notes he had a bad fall in 06/29/23 which has decreased his mobility.   Past Medical History:  Diagnosis Date   Ankylosing spondylitis (HCC) dx'd ~ 1974   Arthritis    "back" (08/10/2015)   BENIGN PROSTATIC HYPERTROPHY, WITH OBSTRUCTION 01/15/2010   Bleeding duodenal ulcer    Bleeding esophageal ulcer    Bleeding stomach ulcer    GERD 02/22/2008   History of blood transfusion 1988   "lost ~ 1/2 of my blood volume from multiple bleeding ulcers"   History of hiatal hernia    HYPERGLYCEMIA 11/18/2007   HYPERLIPIDEMIA 02/22/2008   HYPERTENSION, UNSPECIFIED 11/10/2009   Kidney stones     Osteoarthritis    c-spine   PEPTIC ULCER DISEASE 11/17/2007   Situational depression    "son died in MVA June 29, 2015"   TIA (transient ischemic attack)    "not that I know of in the past; they are trying to determine if I've had one today (08/10/2015)"    Past Surgical History:  Procedure Laterality Date   CATARACT EXTRACTION W/ INTRAOCULAR LENS  IMPLANT, BILATERAL Bilateral 2013    Current Medications: Current Meds  Medication Sig   aspirin 81 MG tablet Take 81 mg by mouth daily.     atorvastatin (LIPITOR) 10 MG tablet TAKE 1 TABLET BY MOUTH  DAILY   carbidopa-levodopa (SINEMET IR) 25-100 MG tablet Take 1 tablet by mouth 5 (five) times daily. Take at 6, 10, 2 PM, 6 PM, 9 PM daily.   cholecalciferol (VITAMIN D3) 25 MCG (1000 UNIT) tablet Take 1 tablet by mouth daily.   famotidine (PEPCID) 40 MG tablet    levothyroxine (SYNTHROID) 50 MCG tablet Take 50 mcg by mouth daily.   losartan-hydrochlorothiazide (HYZAAR) 50-12.5 MG tablet Take 0.5 tablets by mouth daily. KEEP OV.   Multiple Vitamin (MULTIVITAMIN WITH MINERALS) TABS tablet Take 1 tablet by mouth daily.   pantoprazole (PROTONIX) 20 MG tablet Take 20 mg by mouth daily.   Secukinumab (COSENTYX [place]) Inject into the skin every 30 (thirty) days.   sulindac (CLINORIL) 150 MG tablet Take 150 mg by mouth daily as needed.  Allergies:   Patient has no known allergies.   Social History   Socioeconomic History   Marital status: Married    Spouse name: Not on file   Number of children: Not on file   Years of education: Not on file   Highest education level: Not on file  Occupational History   Not on file  Tobacco Use   Smoking status: Former    Types: Cigars, Pipe    Quit date: 08/26/1978    Years since quitting: 44.4   Smokeless tobacco: Never  Vaping Use   Vaping Use: Never used  Substance and Sexual Activity   Alcohol use: Yes    Comment: 08/10/2015 "glass of wine ~ month or so; occasionally a can of beer"   Drug use: No    Sexual activity: Yes  Other Topics Concern   Not on file  Social History Narrative   Retired and married.    Social Determinants of Health   Financial Resource Strain: Not on file  Food Insecurity: Not on file  Transportation Needs: Not on file  Physical Activity: Not on file  Stress: Not on file  Social Connections: Not on file     Family History: The patient's family history includes Appendicitis in his mother; Colon cancer in his father and another family member; Diabetes type II in his father; Lupus in his sister. There is no history of Parkinson's disease.  ROS:   Please see the history of present illness.    All other systems reviewed and are negative.  EKGs/Labs/Other Studies Reviewed:    The following studies were reviewed today:  EKG:  EKG ordered today.  The ekg ordered today demonstrates Normal sinus rhythm at 82bpm, rSr' noted in lead III  EKG 12/20/21: NSR at 83bpm, no ST/T wave changes   Carotid dopplers 12/03/16: 1-39% bilateral carotid disease.   Recent Labs: 06/06/2022: BUN 22; Creatinine, Ser 1.13; Hemoglobin 12.3; Platelets 239; Potassium 3.8; Sodium 135  Recent Lipid Panel    Component Value Date/Time   CHOL 118 11/04/2017 0934   TRIG 35.0 11/04/2017 0934   HDL 68.70 11/04/2017 0934   CHOLHDL 2 11/04/2017 0934   VLDL 7.0 11/04/2017 0934   LDLCALC 42 11/04/2017 0934    Physical Exam:    VS:  BP 138/68   Pulse 77   Ht 5\' 6"  (1.676 m)   Wt 155 lb 14.4 oz (70.7 kg)   SpO2 99%   BMI 25.16 kg/m     Wt Readings from Last 3 Encounters:  01/30/23 155 lb 14.4 oz (70.7 kg)  10/24/22 156 lb 8 oz (71 kg)  07/03/22 153 lb 3.2 oz (69.5 kg)    GEN: Well nourished, well developed in no acute distress HEENT: Normal NECK: No JVD; No carotid bruits LYMPHATICS: No lymphadenopathy CARDIAC: RRR, 2/6 systolic murmur  RESPIRATORY:  Clear to auscultation without rales, wheezing or rhonchi  ABDOMEN: Soft, non-tender, non-distended MUSCULOSKELETAL:  No edema;  No deformity  SKIN: Warm and dry NEUROLOGIC:  Alert and oriented x 3 PSYCHIATRIC:  Normal affect   ASSESSMENT:    1. Stenosis of carotid artery, unspecified laterality   2. Left chest pressure   3. Essential hypertension   4. Mixed hyperlipidemia   5. TIA (transient ischemic attack)   6. Cardiac murmur    PLAN:    In order of problems listed above:  Carotid stenosis/Dizziness: Mild nonobstructive on doppler in 2018. Today reports some intermittent dizziness, no syncope or presyncope. Will repeat carotid dopplers.  Continue ASA 81mg  daily, atorvastatin 10mg  daily.   Chest pressure: He reports three episodes of left sided chest pressure during exertion that relieves with rest in the last month. He denies other anginal symptoms during these episodes. Endorses occasional dizziness as noted above, does not associate with chest pressure. Discussed options for further ischemic evaluation including coronary CTA, PET stress test or if needed heart catheterization, he and his wife are not interested in further evaluation at this time. He will monitor his symptoms and will notify the office of worsening or changing symptoms. Reviewed ED protocol with patient and his wife. Continue ASA 81 mg daily, atorvastatin daily, Losartan-HCTZ daily.  Hypertension: Blood pressure well controlled, initial BP 148/60, recheck 138/68. Continue losartan-hydrochlorothiazide daily.   Hyperlipidemia: Last lipid panel in May 2024: Cholesterol 127, triglycerides 64, HDL 52, LDL 62. Continue atorvastatin 10mg  daily.   Parkinson's disease: Followed by Neuro.   Cardiac murmur: Systolic murmur noted today on physical exam, per chart review and patient interview appears new. Will order echocardiogram given new cardiac murmur and chest pressure as noted above.     Medication Adjustments/Labs and Tests Ordered: Current medicines are reviewed at length with the patient today.  Concerns regarding medicines are outlined above.   Orders Placed This Encounter  Procedures   ECHOCARDIOGRAM COMPLETE   VAS US CAROTID   No orders of the defined types were placed in this encounter.   Patient Instructions  Medication Instructions:  Your physician recommends that you continue on your current medications as directed. Please refer to the Current Medication list given to you today.   *If you need a refill on your cardiac medications before your next appointment, please call your pharmacy*   Lab Work: NONE ordered at this time of appointment.  Testing/Procedures: Your physician has requested that you have a carotid duplex. This test is an ultrasound of the carotid arteries in your neck. It looks at blood flow through these arteries that supply the brain with blood. Allow one hour for this exam. There are no restrictions or special instructions.   Your physician has requested that you have an echocardiogram. Echocardiography is a painless test that uses sound waves to create images of your heart. It provides your doctor with information about the size and shape of your heart and how well your heart's chambers and valves are working. This procedure takes approximately one hour. There are no restrictions for this procedure. Please do NOT wear cologne, perfume, aftershave, or lotions (deodorant is allowed). Please arrive 15 minutes prior to your appointment time.   Follow-Up: At [facility], you and your health needs are our priority.  As part of our continuing mission to provide you with exceptional heart care, we have created designated Provider Care Teams.  These Care Teams include your primary Cardiologist (physician) and Advanced Practice Providers (APPs -  Physician Assistants and Nurse Practitioners) who all work together to provide you with the care you need, when you need it.  We recommend signing up for the patient portal called "MyChart".  Sign up information is provided on this After Visit Summary.   MyChart is used to connect with patients for Virtual Visits (Telemedicine).  Patients are able to view lab/test results, encounter notes, upcoming appointments, etc.  Non-urgent messages can be sent to your provider as well.   To learn more about what you can do with MyChart, go to [email or web address].    Your next appointment:   3 month(s)  Provider:   Any  APP    Other Instructions    Signed, Rip Harbour, NP  01/30/2023 7:21 PM    [facility]

## 2023-01-30 ENCOUNTER — Encounter: Payer: Self-pay | Admitting: Nurse Practitioner

## 2023-01-30 ENCOUNTER — Ambulatory Visit: Payer: Medicare Other | Attending: Nurse Practitioner | Admitting: Cardiology

## 2023-01-30 VITALS — BP 138/68 | HR 77 | Ht 66.0 in | Wt 155.9 lb

## 2023-01-30 DIAGNOSIS — I10 Essential (primary) hypertension: Secondary | ICD-10-CM | POA: Insufficient documentation

## 2023-01-30 DIAGNOSIS — E782 Mixed hyperlipidemia: Secondary | ICD-10-CM | POA: Diagnosis not present

## 2023-01-30 DIAGNOSIS — G459 Transient cerebral ischemic attack, unspecified: Secondary | ICD-10-CM | POA: Diagnosis not present

## 2023-01-30 DIAGNOSIS — R0789 Other chest pain: Secondary | ICD-10-CM | POA: Insufficient documentation

## 2023-01-30 DIAGNOSIS — R011 Cardiac murmur, unspecified: Secondary | ICD-10-CM | POA: Insufficient documentation

## 2023-01-30 DIAGNOSIS — I6529 Occlusion and stenosis of unspecified carotid artery: Secondary | ICD-10-CM | POA: Diagnosis not present

## 2023-01-30 NOTE — Patient Instructions (Addendum)
Medication Instructions:  Your physician recommends that you continue on your current medications as directed. Please refer to the Current Medication list given to you today.   *If you need a refill on your cardiac medications before your next appointment, please call your pharmacy*   Lab Work: NONE ordered at this time of appointment.  Testing/Procedures: Your physician has requested that you have a carotid duplex. This test is an ultrasound of the carotid arteries in your neck. It looks at blood flow through these arteries that supply the brain with blood. Allow one hour for this exam. There are no restrictions or special instructions.   Your physician has requested that you have an echocardiogram. Echocardiography is a painless test that uses sound waves to create images of your heart. It provides your doctor with information about the size and shape of your heart and how well your heart's chambers and valves are working. This procedure takes approximately one hour. There are no restrictions for this procedure. Please do NOT wear cologne, perfume, aftershave, or lotions (deodorant is allowed). Please arrive 15 minutes prior to your appointment time.   Follow-Up: At [facility], you and your health needs are our priority.  As part of our continuing mission to provide you with exceptional heart care, we have created designated Provider Care Teams.  These Care Teams include your primary Cardiologist (physician) and Advanced Practice Providers (APPs -  Physician Assistants and Nurse Practitioners) who all work together to provide you with the care you need, when you need it.  We recommend signing up for the patient portal called "MyChart".  Sign up information is provided on this After Visit Summary.  MyChart is used to connect with patients for Virtual Visits (Telemedicine).  Patients are able to view lab/test results, encounter notes, upcoming appointments, etc.  Non-urgent messages  can be sent to your provider as well.   To learn more about what you can do with MyChart, go to [email or web address].    Your next appointment:   3 month(s)  Provider:   Any APP    Other Instructions

## 2023-01-31 ENCOUNTER — Encounter: Payer: Self-pay | Admitting: Cardiology

## 2023-01-31 DIAGNOSIS — H35372 Puckering of macula, left eye: Secondary | ICD-10-CM | POA: Diagnosis not present

## 2023-01-31 DIAGNOSIS — H5213 Myopia, bilateral: Secondary | ICD-10-CM | POA: Diagnosis not present

## 2023-01-31 DIAGNOSIS — H52203 Unspecified astigmatism, bilateral: Secondary | ICD-10-CM | POA: Diagnosis not present

## 2023-01-31 DIAGNOSIS — H2513 Age-related nuclear cataract, bilateral: Secondary | ICD-10-CM | POA: Diagnosis not present

## 2023-02-03 ENCOUNTER — Other Ambulatory Visit: Payer: Self-pay | Admitting: Cardiology

## 2023-02-03 NOTE — Addendum Note (Signed)
Addended by: Berlinda Last on: 02/03/2023 01:10 PM   Modules accepted: Orders

## 2023-02-04 DIAGNOSIS — M79676 Pain in unspecified toe(s): Secondary | ICD-10-CM | POA: Diagnosis not present

## 2023-02-04 DIAGNOSIS — B351 Tinea unguium: Secondary | ICD-10-CM | POA: Diagnosis not present

## 2023-03-01 ENCOUNTER — Other Ambulatory Visit: Payer: Self-pay | Admitting: Cardiology

## 2023-03-02 ENCOUNTER — Other Ambulatory Visit: Payer: Self-pay | Admitting: Cardiology

## 2023-03-03 ENCOUNTER — Encounter (HOSPITAL_BASED_OUTPATIENT_CLINIC_OR_DEPARTMENT_OTHER): Payer: Self-pay

## 2023-03-03 ENCOUNTER — Emergency Department (HOSPITAL_BASED_OUTPATIENT_CLINIC_OR_DEPARTMENT_OTHER): Payer: Medicare Other

## 2023-03-03 ENCOUNTER — Other Ambulatory Visit: Payer: Self-pay

## 2023-03-03 ENCOUNTER — Telehealth: Payer: Self-pay | Admitting: Family Medicine

## 2023-03-03 ENCOUNTER — Emergency Department (HOSPITAL_BASED_OUTPATIENT_CLINIC_OR_DEPARTMENT_OTHER)
Admission: EM | Admit: 2023-03-03 | Discharge: 2023-03-03 | Disposition: A | Discharge status: Home / Self Care | Payer: Medicare Other | Attending: Emergency Medicine | Admitting: Emergency Medicine

## 2023-03-03 DIAGNOSIS — S0990XA Unspecified injury of head, initial encounter: Secondary | ICD-10-CM | POA: Insufficient documentation

## 2023-03-03 DIAGNOSIS — M549 Dorsalgia, unspecified: Secondary | ICD-10-CM | POA: Diagnosis not present

## 2023-03-03 DIAGNOSIS — R109 Unspecified abdominal pain: Secondary | ICD-10-CM | POA: Diagnosis not present

## 2023-03-03 DIAGNOSIS — Z7982 Long term (current) use of aspirin: Secondary | ICD-10-CM | POA: Insufficient documentation

## 2023-03-03 DIAGNOSIS — W19XXXA Unspecified fall, initial encounter: Secondary | ICD-10-CM | POA: Insufficient documentation

## 2023-03-03 DIAGNOSIS — G20A1 Parkinson's disease without dyskinesia, without mention of fluctuations: Secondary | ICD-10-CM | POA: Insufficient documentation

## 2023-03-03 DIAGNOSIS — S299XXA Unspecified injury of thorax, initial encounter: Secondary | ICD-10-CM | POA: Insufficient documentation

## 2023-03-03 DIAGNOSIS — S301XXA Contusion of abdominal wall, initial encounter: Secondary | ICD-10-CM | POA: Diagnosis not present

## 2023-03-03 DIAGNOSIS — R443 Hallucinations, unspecified: Secondary | ICD-10-CM | POA: Insufficient documentation

## 2023-03-03 DIAGNOSIS — Y92009 Unspecified place in unspecified non-institutional (private) residence as the place of occurrence of the external cause: Secondary | ICD-10-CM | POA: Diagnosis not present

## 2023-03-03 DIAGNOSIS — M545 Low back pain, unspecified: Secondary | ICD-10-CM | POA: Diagnosis not present

## 2023-03-03 DIAGNOSIS — R932 Abnormal findings on diagnostic imaging of liver and biliary tract: Secondary | ICD-10-CM | POA: Diagnosis not present

## 2023-03-03 DIAGNOSIS — I10 Essential (primary) hypertension: Secondary | ICD-10-CM | POA: Insufficient documentation

## 2023-03-03 DIAGNOSIS — R9431 Abnormal electrocardiogram [ECG] [EKG]: Secondary | ICD-10-CM | POA: Diagnosis not present

## 2023-03-03 DIAGNOSIS — Z79899 Other long term (current) drug therapy: Secondary | ICD-10-CM | POA: Insufficient documentation

## 2023-03-03 DIAGNOSIS — S3991XA Unspecified injury of abdomen, initial encounter: Secondary | ICD-10-CM | POA: Diagnosis present

## 2023-03-03 DIAGNOSIS — M8580 Other specified disorders of bone density and structure, unspecified site: Secondary | ICD-10-CM | POA: Diagnosis not present

## 2023-03-03 DIAGNOSIS — R9089 Other abnormal findings on diagnostic imaging of central nervous system: Secondary | ICD-10-CM | POA: Diagnosis not present

## 2023-03-03 DIAGNOSIS — R296 Repeated falls: Secondary | ICD-10-CM | POA: Insufficient documentation

## 2023-03-03 DIAGNOSIS — E039 Hypothyroidism, unspecified: Secondary | ICD-10-CM | POA: Diagnosis not present

## 2023-03-03 DIAGNOSIS — S199XXA Unspecified injury of neck, initial encounter: Secondary | ICD-10-CM | POA: Diagnosis not present

## 2023-03-03 LAB — CBC WITH DIFFERENTIAL/PLATELET
Abs Immature Granulocytes: 0.03 10*3/uL (ref 0.00–0.07)
Basophils Absolute: 0 10*3/uL (ref 0.0–0.1)
Basophils Relative: 0 %
Eosinophils Absolute: 0 10*3/uL (ref 0.0–0.5)
Eosinophils Relative: 0 %
HCT: 37.3 % — ABNORMAL LOW (ref 39.0–52.0)
Hemoglobin: 12.5 g/dL — ABNORMAL LOW (ref 13.0–17.0)
Immature Granulocytes: 0 %
Lymphocytes Relative: 14 %
Lymphs Abs: 1.1 10*3/uL (ref 0.7–4.0)
MCH: 33.7 pg (ref 26.0–34.0)
MCHC: 33.5 g/dL (ref 30.0–36.0)
MCV: 100.5 fL — ABNORMAL HIGH (ref 80.0–100.0)
Monocytes Absolute: 0.7 10*3/uL (ref 0.1–1.0)
Monocytes Relative: 8 %
Neutro Abs: 6.2 10*3/uL (ref 1.7–7.7)
Neutrophils Relative %: 78 %
Platelets: 203 10*3/uL (ref 150–400)
RBC: 3.71 MIL/uL — ABNORMAL LOW (ref 4.22–5.81)
RDW: 13 % (ref 11.5–15.5)
WBC: 8.1 10*3/uL (ref 4.0–10.5)
nRBC: 0 % (ref 0.0–0.2)

## 2023-03-03 LAB — COMPREHENSIVE METABOLIC PANEL
ALT: <5 U/L (ref 0–44)
AST: 16 U/L (ref 15–41)
Albumin: 4.8 g/dL (ref 3.5–5.0)
Alkaline Phosphatase: 58 U/L (ref 38–126)
Anion gap: 8 (ref 5–15)
BUN: 27 mg/dL — ABNORMAL HIGH (ref 8–23)
CO2: 28 mmol/L (ref 22–32)
Calcium: 10 mg/dL (ref 8.9–10.3)
Chloride: 103 mmol/L (ref 98–111)
Creatinine, Ser: 1.11 mg/dL (ref 0.61–1.24)
GFR, Estimated: >60 mL/min (ref >60)
Glucose, Bld: 106 mg/dL — ABNORMAL HIGH (ref 70–99)
Potassium: 3.9 mmol/L (ref 3.5–5.1)
Sodium: 139 mmol/L (ref 135–145)
Total Bilirubin: 1.2 mg/dL (ref 0.3–1.2)
Total Protein: 6.7 g/dL (ref 6.5–8.1)

## 2023-03-03 LAB — URINALYSIS, ROUTINE W REFLEX MICROSCOPIC
Bilirubin Urine: NEGATIVE
Glucose, UA: NEGATIVE mg/dL
Hgb urine dipstick: NEGATIVE
Ketones, ur: NEGATIVE mg/dL
Leukocytes,Ua: NEGATIVE
Nitrite: NEGATIVE
Protein, ur: NEGATIVE mg/dL
Specific Gravity, Urine: 1.011 (ref 1.005–1.030)
pH: 6.5 (ref 5.0–8.0)

## 2023-03-03 LAB — CK: Total CK: 112 U/L (ref 49–397)

## 2023-03-03 MED ORDER — IOHEXOL 300 MG/ML  SOLN
100.0000 mL | Freq: Once | INTRAMUSCULAR | Status: AC | PRN
  Administered 2023-03-03: 85 mL via INTRAVENOUS

## 2023-03-03 NOTE — Discharge Instructions (Signed)
As discussed, we have sent in a social work referral he will call to set up home health needs as well as figure out primary care contact with you in the outpatient setting.  I have also sent a message to your neurologist to try to get you to be seen more quickly.  See information attached to your discharge papers to prevent falls in the outpatient setting.  Please do not hesitate to return to emergency department for worrisome signs and symptoms we discussed to become apparent.

## 2023-03-03 NOTE — ED Triage Notes (Signed)
Patient here POV from Home with Significant Other.  Endorses fall this at 0400. Was ambulating to use restroom when he tripped getting to his walker on the staircase causing fall. Possible Head injury. No LOC. No Anticoagulants. Pain to left Flank. No C-Spine Tenderness.   History of Parkinson's Disease. States he has been having more falls than usual over the past few weeks.   NAD Noted during Triage. A&Ox4. Gcs 15. BIB Wheelchair.

## 2023-03-03 NOTE — ED Provider Notes (Signed)
[place] EMERGENCY DEPARTMENT AT [facility] Provider Note   CSN: [id number] Arrival date & time: 03/03/23  1124     History {Add pertinent medical, surgical, social history, OB history to HPI:1} Chief Complaint  Patient presents with   [name] is a 87 y.o. male.   Fall   87 year old male presents emergency department with complaints of recurrent falls as well as some hallucinations.  Patient with history of Parkinson's disease with unstable gait at baseline.  States he uses a walker to help get around with.  Reports multiple falls since Friday of last week occurring almost daily.  States that he has felt more unbalanced with gait since Friday as well as had more incidence of catching his walker on objects at home.  Most recent fall occurred when he was attempting to go through a doorway when his walker got caught on the edge of the door frame causing him to fall down "half a flight of stairs."  Reports probable trauma to head but no loss of consciousness or blood thinner use.  Reporting some left-sided flank pain from fall as well as slightly worsening low back pain.  Denies any chest pain, shortness of breath, abdominal pain, nausea, vomiting.  Denies any new weakness or sensory deficits of lower extremities, saddle anesthesia, bowel/bladder dysfunction.  Denies any visual disturbance from baseline, slurred speech, facial droop.  Past medical history significant for TIA, ankylosing spondylitis, carotid artery stenosis, hyperlipidemia, BPH, hyperglycemia, bleeding duodenal/esophageal/stomach ulcer, kidney stone, hypothyroidism, hypertension, Parkinson's, chronic constipation  Home Medications Prior to Admission medications   Medication Sig Start Date End Date Taking? Authorizing Provider  aspirin 81 MG tablet Take 81 mg by mouth daily.      [provider]  atorvastatin (LIPITOR) 10 MG tablet TAKE 1 TABLET BY MOUTH  DAILY 04/03/22   Swaziland, Peter M, MD   carbidopa-levodopa (SINEMET IR) 25-100 MG tablet Take 1 tablet by mouth 5 (five) times daily. Take at 6, 10, 2 PM, 6 PM, 9 PM daily. 07/03/22   Huston Foley, MD  cholecalciferol (VITAMIN D3) 25 MCG (1000 UNIT) tablet Take 1 tablet by mouth daily. 04/13/19   [provider]  famotidine (PEPCID) 40 MG tablet     [provider]  levothyroxine (SYNTHROID) 50 MCG tablet Take 50 mcg by mouth daily. 01/23/21   [provider]  losartan-hydrochlorothiazide (HYZAAR) 50-12.5 MG tablet TAKE ONE-HALF TABLET BY MOUTH  DAILY 02/03/23   Swaziland, Peter M, MD  Multiple Vitamin (MULTIVITAMIN WITH MINERALS) TABS tablet Take 1 tablet by mouth daily.    [provider]  nitroGLYCERIN (NITROSTAT) 0.4 MG SL tablet Place 1 tablet (0.4 mg total) under the tongue every 5 (five) minutes as needed. Patient not taking: Reported on 01/30/2023 08/02/15   Rosalio Macadamia, NP  pantoprazole (PROTONIX) 20 MG tablet Take 20 mg by mouth daily. 03/09/20   [provider]  Secukinumab (COSENTYX [place]) Inject into the skin every 30 (thirty) days.    [provider]  sulindac (CLINORIL) 150 MG tablet Take 150 mg by mouth daily as needed.    [provider]      Allergies    Patient has no known allergies.    Review of Systems   Review of Systems  All other systems reviewed and are negative.   Physical Exam Updated Vital Signs BP 132/63 (BP Location: Left Arm)   Pulse 77   Temp 98.3 F (36.8 C)   Resp  16   Ht 5\' 7"  (1.702 m)   Wt 70.3 kg   SpO2 100%   BMI 24.28 kg/m  Physical Exam Vitals and nursing note reviewed.  Constitutional:      General: He is not in acute distress.    Appearance: He is well-developed.     Comments: Patient with resting pill-rolling tremor left hand.  HENT:     Head: Normocephalic and atraumatic.  Eyes:     Conjunctiva/sclera: Conjunctivae normal.  Cardiovascular:     Rate and Rhythm: Normal rate and regular rhythm.  Pulmonary:      Effort: Pulmonary effort is normal. No respiratory distress.     Breath sounds: Normal breath sounds. No wheezing, rhonchi or rales.  Chest:     Chest wall: No tenderness.  Abdominal:     Palpations: Abdomen is soft.     Tenderness: There is abdominal tenderness.     Comments: Left-sided flank tenderness with some ecchymosis.  Musculoskeletal:        General: No swelling.     Cervical back: Neck supple.     Right lower leg: No edema.     Left lower leg: No edema.     Comments: No midline tenderness of cervical, thoracic, lumbar spine without any step-off or Forei noted.  No chest wall tenderness to palpation.  No tenderness palpation of upper or lower extremities.  Radial and pedal pulses 2+ bilaterally.  Skin:    General: Skin is warm and dry.     Capillary Refill: Capillary refill takes less than 2 seconds.  Neurological:     Mental Status: He is alert.     Comments: Alert and oriented to self, place, time and event.   Speech is fluent, clear without dysarthria or dysphasia.   Patient moves all 4 extremities without difficulty. Sensation intact in upper/lower extremities   Patient able to stand independently CN I not tested  CN II not tested CN III, IV, VI PERRLA and EOMs intact bilaterally  CN V Intact sensation to sharp and light touch to the face  CN VII facial movements symmetric  CN VIII not tested  CN IX, X no uvula deviation, symmetric rise of soft palate  CN XI symmetric SCM and trapezius strength bilaterally  CN XII Midline tongue protrusion, symmetric L/R movements     Psychiatric:        Mood and Affect: Mood normal.     ED Results / Procedures / Treatments   Labs (all labs ordered are listed, but only abnormal results are displayed) Labs Reviewed  COMPREHENSIVE METABOLIC PANEL - Abnormal; Notable for the following components:      Result Value   Glucose, Bld 106 (*)    BUN 27 (*)    All other components within normal limits  CBC WITH  DIFFERENTIAL/PLATELET - Abnormal; Notable for the following components:   RBC 3.71 (*)    Hemoglobin 12.5 (*)    HCT 37.3 (*)    MCV 100.5 (*)    All other components within normal limits  URINALYSIS, ROUTINE W REFLEX MICROSCOPIC    EKG None  Radiology No results found.  Procedures Procedures  {Document cardiac monitor, telemetry assessment procedure when appropriate:1}  Medications Ordered in ED Medications - No data to display  ED Course/ Medical Decision Making/ A&P Clinical Course as of 03/03/23 Renee Rival Mar 03, 2023  [facility] Consulted hospitalist Dr. Adela Glimpse who deferred admission.  Will attempt to consult [place] neurology for closer outpatient evaluation. [  CR]    Clinical Course User Index [CR] Peter Garter, PA   {   Click here for ABCD2, HEART and other calculatorsREFRESH Note before signing :1}                          Medical Decision Making Amount and/or Complexity of Data Reviewed Labs: ordered. Radiology: ordered.  Risk Prescription drug management.   This patient presents to the ED for concern of ***, this involves an extensive number of treatment options, and is a complaint that carries with it a high risk of complications and morbidity.  The differential diagnosis includes ***   Co morbidities that complicate the patient evaluation  See HPI   Additional history obtained:  Additional history obtained from EMR External records from outside source obtained and reviewed including ***   Lab Tests:  I Ordered, and personally interpreted labs.  The pertinent results include:  ***   Imaging Studies ordered:  I ordered imaging studies including ***  I independently visualized and interpreted imaging which showed *** I agree with the radiologist interpretation   Cardiac Monitoring: / EKG:  The patient was maintained on a cardiac monitor.  I personally viewed and interpreted the cardiac monitored which showed an underlying rhythm of:  ***   Consultations Obtained:  I requested consultation with the ***,  and discussed lab and imaging findings as well as pertinent plan - they recommend: ***   Problem List / ED Course / Critical interventions / Medication management  *** I ordered medication including ***  for ***  Reevaluation of the patient after these medicines showed that the patient {resolved/improved/worsened:23923::"improved"} I have reviewed the patients home medicines and have made adjustments as needed   Social Determinants of Health:  ***   Test / Admission - Considered:  Vitals signs significant for ***. Otherwise within normal range and stable throughout visit. Laboratory/imaging studies significant for: *** *** Worrisome signs and symptoms were discussed with the patient, and the patient acknowledged understanding to return to the ED if noticed. Patient was stable upon discharge.    {Document critical care time when appropriate:1} {Document review of labs and clinical decision tools ie heart score, Chads2Vasc2 etc:1}  {Document your independent review of radiology images, and any outside records:1} {Document your discussion with family members, caretakers, and with consultants:1} {Document social determinants of health affecting pt's care:1} {Document your decision making why or why not admission, treatments were needed:1} Final Clinical Impression(s) / ED Diagnoses Final diagnoses:  None    Rx / DC Orders ED Discharge Orders     None

## 2023-03-03 NOTE — Progress Notes (Signed)
87yo M w hx of Parkinson's and TIA, HTN, HLD Have been falls daily. Walks with a walker at baseline has been a bit more unstable. Had a Curator fall. Family do not want placement.  Neurology sent him to ER He is neurologically intact CT Head neck and/chest. Rec deferred admission at this time as there's no indication for an admit. Rec consult Neurology to see if can arrange to be seen in the office faster or PT/OT [name]

## 2023-03-03 NOTE — Telephone Encounter (Signed)
LMVM for pt that returned call.  °

## 2023-03-03 NOTE — Telephone Encounter (Signed)
Pt stated he needs to talk to nurse about being seen sooner. Stated he has been falling and needs to be seen today. I advised pt to follow-up with ER or Urgent Care if he feels he need to be seen today.

## 2023-03-04 ENCOUNTER — Telehealth: Payer: Self-pay | Admitting: Family Medicine

## 2023-03-04 NOTE — Telephone Encounter (Addendum)
Per Amy Lomax,NP  We need to call this patient and see if we can get him scheduled with Dr Frances Furbish. The ER called and said he has been seen for multiple falls and they feel it is related to worsening parkinsons. I saw him 09/2022 and had him work with PT but not helping. Can we see if we can get him in with Dr Frances Furbish anytime in the next two months? TY!

## 2023-03-04 NOTE — Telephone Encounter (Signed)
Hermenia Fiscal, RN left message for patient to call on 03/03/23. Pt called also stating he needs to be seen.

## 2023-03-04 NOTE — Telephone Encounter (Signed)
Pt was seen in ED/ worsening P falls/hallucination.

## 2023-03-05 ENCOUNTER — Ambulatory Visit (INDEPENDENT_AMBULATORY_CARE_PROVIDER_SITE_OTHER): Payer: Medicare Other

## 2023-03-05 DIAGNOSIS — R011 Cardiac murmur, unspecified: Secondary | ICD-10-CM

## 2023-03-05 DIAGNOSIS — Z8673 Personal history of transient ischemic attack (TIA), and cerebral infarction without residual deficits: Secondary | ICD-10-CM

## 2023-03-05 LAB — ECHOCARDIOGRAM COMPLETE
Area-P 1/2: 3.77 cm2
Calc EF: 58.4 %
S' Lateral: 1.15 cm
Single Plane A2C EF: 61.1 %
Single Plane A4C EF: 55.9 %

## 2023-03-06 ENCOUNTER — Telehealth: Payer: Self-pay

## 2023-03-06 ENCOUNTER — Telehealth: Payer: Self-pay | Admitting: Occupational Therapy

## 2023-03-06 DIAGNOSIS — R2681 Unsteadiness on feet: Secondary | ICD-10-CM

## 2023-03-06 DIAGNOSIS — R278 Other lack of coordination: Secondary | ICD-10-CM

## 2023-03-06 DIAGNOSIS — R262 Difficulty in walking, not elsewhere classified: Secondary | ICD-10-CM

## 2023-03-06 DIAGNOSIS — G20A2 Parkinson's disease without dyskinesia, with fluctuations: Secondary | ICD-10-CM

## 2023-03-06 DIAGNOSIS — R41841 Cognitive communication deficit: Secondary | ICD-10-CM

## 2023-03-06 NOTE — Telephone Encounter (Signed)
Referral to neuro rehab placed  

## 2023-03-06 NOTE — Telephone Encounter (Signed)
Dr. Frances Furbish,  [name] is scheduled for  OT and PT re-evaluations on 04/23/23 as recommended when he was last discharged from therapy due to progressive nature of diagnosis.  Pt was in agreement with this plan.  If you are in agreement, please send updated order for OT and PT via epic.  Thank you, Krishay Faro, OTR/L

## 2023-03-06 NOTE — Telephone Encounter (Signed)
-----   Message from [facility] sent at 03/06/2023  1:19 PM EDT ----- Overall good echo results. Heart function and size is normal, ejection fraction 55 to 60% which is normal.

## 2023-03-06 NOTE — Telephone Encounter (Signed)
Patient is aware of echo and carotid doppler results. He verbalized understanding.

## 2023-04-10 ENCOUNTER — Ambulatory Visit (INDEPENDENT_AMBULATORY_CARE_PROVIDER_SITE_OTHER): Payer: Medicare Other | Admitting: Neurology

## 2023-04-10 ENCOUNTER — Encounter: Payer: Self-pay | Admitting: Neurology

## 2023-04-10 ENCOUNTER — Telehealth: Payer: Self-pay | Admitting: Neurology

## 2023-04-10 VITALS — BP 155/72 | HR 77 | Wt 153.0 lb

## 2023-04-10 DIAGNOSIS — Z9181 History of falling: Secondary | ICD-10-CM

## 2023-04-10 DIAGNOSIS — R443 Hallucinations, unspecified: Secondary | ICD-10-CM | POA: Diagnosis not present

## 2023-04-10 DIAGNOSIS — R296 Repeated falls: Secondary | ICD-10-CM

## 2023-04-10 DIAGNOSIS — K117 Disturbances of salivary secretion: Secondary | ICD-10-CM | POA: Diagnosis not present

## 2023-04-10 DIAGNOSIS — G20C Parkinsonism, unspecified: Secondary | ICD-10-CM

## 2023-04-10 DIAGNOSIS — R498 Other voice and resonance disorders: Secondary | ICD-10-CM

## 2023-04-10 DIAGNOSIS — R279 Unspecified lack of coordination: Secondary | ICD-10-CM | POA: Diagnosis not present

## 2023-04-10 DIAGNOSIS — R2689 Other abnormalities of gait and mobility: Secondary | ICD-10-CM

## 2023-04-10 NOTE — Telephone Encounter (Signed)
Referral for neurorehabilitation sent through EPIC to   [facility]

## 2023-04-10 NOTE — Progress Notes (Signed)
Subjective:    Patient ID: [name] is a 87 y.o. male.  HPI    Interim history:   [name] is an 87 year old right-handed gentleman with an underlying complex medical history of peptic ulcer disease with history of upper GI bleed, TIA, depression, degenerative disc disease in the neck, osteoarthritis, kidney stones, hyperlipidemia, hypertension, history of hiatal hernia, BPH, arthritis, and ankylosing spondylitis, and parkinsonism, who presents for follow-up of his parkinsonism, complicated by recurrent falls and sleep disruption from nocturia. He also has OSA. The patient is accompanied by his wife again today and presents for a sooner than scheduled appointment. I last saw him on 07/03/2022, at which time he reported overall weakness and difficulty standing and walking at times.  He was sleeping in the recliner.  He was not using his AutoPap since he had sustained a forehead laceration.  He had fallen in October 2023 and required stitches to the forehead.  He had more mobility decline since then.  He was advised to consult with urology for nocturia.  I made a referral to neurorehab for PT and OT.  He was advised to continue with generic Sinemet 1 pill 5 times a day and take MiraLAX for constipation control.  He saw Casey Dapper, NP on 10/24/2022, at which time he reported recurrent falls.  He had worked with PT and OT.  He had issues with sialorrhea.  He was supposed to see ST.  Memory was overall stable.  He was advised to restart using his AutoPap consistently, he needed a new DME and a new mask.  He requested a sooner appointment in the interim.  Today, 04/10/2023: He reports feeling weaker overall.  He has had falls.  He has had instances where he could not stop his movement as he fell backwards.  He tries to walk around in the house on a regular basis, sometimes he does not use his 2 wheeled walker.  He usually gets 4 doses of Sinemet and on a regular day, sometimes all 5 doses.  He has had more  drooling, he has occasional visual hallucinations but nothing alarming per se.   He presented to the emergency room on 03/03/2023 with recurrent falls as well as hallucinations.  I reviewed the emergency room records.  He had a head CT without contrast and cervical spine CT without contrast on 03/03/2023 and I reviewed the report: Impression: No evidence of acute intracranial or cervical spine injury.  He also had a CT of the chest, abdomen and pelvis with contrast on 03/03/2023 and I reviewed the results:  IMPRESSION: 1. No evidence of acute traumatic injury to the chest, abdomen, or pelvis. 2. Mild inferior endplate compression fracture of T3 has a chronic appearance. 3.  Aortic Atherosclerosis (ICD10-I70.0).  He had a CT of the lumbar spine without contrast on 03/03/2023 and I reviewed the results: Impression: 1. No acute/traumatic lumbar spine pathology. 2. Bilateral L5 pars defects. No listhesis.   Laboratory testing showed mildly elevated BUN at 27 on chemistry profile, otherwise benign findings on CMP, CK level 112, urinalysis negative for UTI, CBC with differential showed below normal RBC at 3.7, hemoglobin below normal at 12.5, hematocrit below normal at 37.3, MCV borderline above normal at 100.5.  Previously:   I saw him on 01/24/2022, at which time he was compliant with his AutoPap therapy, residual AHI was still elevated and he was still having trouble with mask seal.  He was using Sinemet 4 times a day.  I saw him on 09/26/2021, at which time he reported that his tremor was worse.  He reported having fallen.  He was compliant with his AutoPap but his leak was high.  I requested a mask fit appointment through his DME company.  We added Sinemet CR at bedtime and kept him on Sinemet IR 1 pill 4 times a day during the day.    He had side effects from the CR and we discontinued the C/L ER in March 2023.   I reviewed his AutoPap compliance data from 12/24/2021 through 01/22/2022, which is a  total of 30 days, during which time he used his machine every night with percent use days greater than 4 hours at 83%, indicating very good compliance with an average usage of 5 hours and 10 minutes, residual AHI slightly better than last time at 15.8/h but still elevated in the moderate range, average pressure for the 95th percentile at 8.9 cm with a range of 6 to 11 cm with EPR.  Leak still high consistently, perhaps slightly better compared to last time at 93.7 L/min for the 95th percentile.     I saw him on 05/03/21, at which time he was slightly suboptimal with his AutoPap.  His AHI was also elevated around 10/h.  He was advised to make an appointment with his DME provider for mask refit as his leak was also high.  We talked about the importance of staying active and about fall prevention.  He was advised to continue with Sinemet 1 pill 4 times daily.     I reviewed his AutoPap compliance data from 08/26/2021 through 09/24/2021, total of 30 days, during which time he used his machine every night with percent use days greater than 4 hours at 73%, indicating adequate compliance, average usage of 4 hours and 43 minutes, residual AHI elevated at 18.5 with no obvious significant central component.  Average pressure for the 95th percentile at 7.7 cm with a very high leak consistently at 108 L/min, pressure range of 6 to 11 cm with EPR.     I saw him on 10/18/2020, at which time he reported feeling stable with regards to his parkinsonian symptoms and he was tolerating Sinemet 1 pill 4 times a day, felt that he benefited from it.  He was willing to start AutoPap therapy.   He had an interim appointment with Casey Dapper, NP on 01/25/2021, at which time he was compliant with AutoPap therapy and felt that it was beneficial.   I reviewed his AutoPap compliance data from 04/02/2021 through 05/01/2021, which is a total of 30 days, during which time he used his machine every night with percent use days greater than 4 hours at  67%, indicating slightly suboptimal compliance with an average usage of 4 hours and 39 minutes, residual AHI elevated at 10.8/h, 95th percentile of pressure at 9.6 cm with a range of 6 to 11 cm, leak rather high consistently with the 95th percentile at 60.1 L/min.   I saw him on 05/10/2020, at which time we went over his sleep study results.  He was agreeable to starting AutoPap therapy.       I saw him on 04/17/2020, at which time he reported some decline in his mobility.  He was advised to increase C/L to 4 times a day. His wife reported that he had some irregularities in his breathing while asleep, also some snoring, he reported congestion and feeling of excess mucus.  He was advised to proceed with  a sleep study.  He had a baseline sleep study on 04/26/2020 and we reviewed test results today.  Sleep efficiency was reduced at 64%, sleep latency 43.5 minutes, REM latency 209.5 minutes.  He had a significantly reduced percentage of REM sleep at 6.2, he had moderate to severe sleep fragmentation, wake after sleep onset was 140 minutes.  Total AHI was 18.3/h.  Supine AHI was 19.8/h.  Average oxygen saturation was 98%, nadir was 87%.  He had mild PLM's no significant arousals.  He was advised to proceed with treatment at home in the form of AutoPap.  He requested a follow-up appointment to discuss.      I saw him on 03/24/2019, at which time he reported doing okay with Sinemet, he was taking it 3 times a day.  He had noticed some benefit including improvement in his mobility and balance.  He felt that perhaps his left hand tremor was a little better.     I spoke to him on 12/23/2018 via phone visit, at which time we mutually agreed to start Sinemet with gradual titration.  He had to reschedule his DaTscan secondary to the virus pandemic.  He had his DaTscan in the interim on 01/07/2019 and I reviewed the results: IMPRESSION: Decreased activity within the LEFT and RIGHT posterior striata is a pattern typical of  Parkinson's syndrome pathology.   We called him with his test results.       I first met him on 09/16/2018 at the request of his rheumatology PA, at which time he reported a bilateral hand tremor for the past at least one year, left side more noticeable than right. He had also increased difficulty with mobility. He had some findings concerning for parkinsonism. I suggested we proceed with a nuclear medicine DaT scan but this was delayed secondary to the coronavirus pandemic.    Initial Visit, 09/16/2018: (He) reports bilateral hand tremor, left more than right for the past at least one year with some progression noted. His wife has noted significant impairment in his mobility this time. He has a left more than right tremor, the left hand is the most significant. He has not noticed any lower extremity tremor. He does have restriction in his shoulder mobility, left more than right. He has no family history of Parkinson's disease, does not know much about his mother's history her family history, she died at 42, when he was 87 years old. His paternal grandmother lived to be above 79 years old. He had a sister who died young at 2 from lupus, his brother has significant arthritis issues at age 16. He injured his right ankle and foot when he was a child and continues to have restricted movements in his right ankle. He has fallen a few times. He fell one or 2 times in the last 6 months, one time he fell in the bathroom and one time he fell in the closet. He has had intermittent numbness in both hands, particularly the right hand, particularly with certain movements or posture, such as while holding the steering wheel.    I reviewed your office note from 07/10/2018, which you kindly included. He has also had some balance issues. For his arthritis he takes Cosentyx injections monthly. Of note, he had a brain MRI without contrast as well as MRA head without contrast on 08/10/2015 and I reviewed the results:  IMPRESSION: MRI HEAD IMPRESSION:   Negative brain MRI with no acute intracranial infarct or other process identified.   MRA  HEAD IMPRESSION:   Negative intracranial MRA. No large or proximal arterial branch occlusion. No high-grade or correctable stenosis.   He is retired, lives with his wife, he quit smoking and drinks alcohol about once a week, caffeine in the form of coffee, 2-3 cups per day on average.     His Past Medical History Is Significant For: Past Medical History:  Diagnosis Date   Ankylosing spondylitis (HCC) dx'd ~ 1974   Arthritis    "back" (08/10/2015)   BENIGN PROSTATIC HYPERTROPHY, WITH OBSTRUCTION 01/15/2010   Bleeding duodenal ulcer    Bleeding esophageal ulcer    Bleeding stomach ulcer    GERD 02/22/2008   History of blood transfusion 1988   "lost ~ 1/2 of my blood volume from multiple bleeding ulcers"   History of hiatal hernia    HYPERGLYCEMIA 11/18/2007   HYPERLIPIDEMIA 02/22/2008   HYPERTENSION, UNSPECIFIED 11/10/2009   Kidney stones    Osteoarthritis    c-spine   PEPTIC ULCER DISEASE 11/17/2007   Situational depression    "son died in MVA Jun 24, 2015"   TIA (transient ischemic attack)    "not that I know of in the past; they are trying to determine if I've had one today (08/10/2015)"    His Past Surgical History Is Significant For: Past Surgical History:  Procedure Laterality Date   CATARACT EXTRACTION W/ INTRAOCULAR LENS  IMPLANT, BILATERAL Bilateral 2013    His Family History Is Significant For: Family History  Problem Relation Age of Onset   Appendicitis Mother        complications   Colon cancer Father    Diabetes type II Father    Lupus Sister    Colon cancer Other        1st degree relative <60   Parkinson's disease Neg Hx     His Social History Is Significant For: Social History   Socioeconomic History   Marital status: Married    Spouse name: Not on file   Number of children: Not on file   Years of education: Not on file    Highest education level: Not on file  Occupational History   Not on file  Tobacco Use   Smoking status: Former    Types: Cigars, Pipe    Quit date: 08/26/1978    Years since quitting: 44.6   Smokeless tobacco: Never  Vaping Use   Vaping status: Never Used  Substance and Sexual Activity   Alcohol use: Yes    Comment: 08/10/2015 "glass of wine ~ month or so; occasionally a can of beer"   Drug use: No   Sexual activity: Yes  Other Topics Concern   Not on file  Social History Narrative   Retired and married.    Social Determinants of Health   Financial Resource Strain: Not on file  Food Insecurity: Not on file  Transportation Needs: Not on file  Physical Activity: Not on file  Stress: Not on file  Social Connections: Not on file    His Allergies Are:  No Known Allergies:   His Current Medications Are:  Outpatient Encounter Medications as of 04/10/2023  Medication Sig   aspirin 81 MG tablet Take 81 mg by mouth daily.     atorvastatin (LIPITOR) 10 MG tablet TAKE 1 TABLET BY MOUTH DAILY   carbidopa-levodopa (SINEMET IR) 25-100 MG tablet Take 1 tablet by mouth 5 (five) times daily. Take at 6, 10, 2 PM, 6 PM, 9 PM daily.   cholecalciferol (VITAMIN D3) 25 MCG (1000  UNIT) tablet Take 1 tablet by mouth daily.   levothyroxine (SYNTHROID) 50 MCG tablet Take 50 mcg by mouth daily.   losartan-hydrochlorothiazide (HYZAAR) 50-12.5 MG tablet TAKE ONE-HALF TABLET BY MOUTH  DAILY   Multiple Vitamin (MULTIVITAMIN WITH MINERALS) TABS tablet Take 1 tablet by mouth daily.   pantoprazole (PROTONIX) 20 MG tablet Take 20 mg by mouth daily.   Secukinumab (COSENTYX [place]) Inject into the skin every 30 (thirty) days.   sulindac (CLINORIL) 150 MG tablet Take 150 mg by mouth daily as needed.   [DISCONTINUED] famotidine (PEPCID) 40 MG tablet  (Patient not taking: Reported on 04/10/2023)   [DISCONTINUED] nitroGLYCERIN (NITROSTAT) 0.4 MG SL tablet Place 1 tablet (0.4 mg total) under the tongue every 5 (five)  minutes as needed. (Patient not taking: Reported on 04/10/2023)   No facility-administered encounter medications on file as of 04/10/2023.  :  Review of Systems:  Out of a complete 14 point review of systems, all are reviewed and negative with the exception of these symptoms as listed below:   Review of Systems  Neurological:        Rm 9 with spouse [name] Pt is well, reports he is having a increase in falls due to imbalance and gait issues. He has also seen things that are not there a few times.     Objective:  Neurological Exam  Physical Exam Physical Examination:   Vitals:   04/10/23 1236 04/10/23 1255  BP: (!) 147/72 (!) 155/72  Pulse: 75 77    General Examination: The patient is a very pleasant 87 y.o. male in no acute distress. He appears frail and deconditioned, well-groomed.   HEENT: Normocephalic, atraumatic, tracking is difficult, neck with moderate to severe rigidity and forward tilt, significant sialorrhea today, moderate hypophonia with mild dysarthria. There are no carotid bruits on auscultation.    Chest: Clear to auscultation without wheezing, rhonchi or crackles noted.   Heart: S1+S2+0, regular and normal without murmurs, rubs or gallops noted.    Abdomen: Soft, non-tender and non-distended.   Extremities: There is no pitting edema around the ankles.      Skin: Warm and dry without trophic changes noted.   Musculoskeletal: exam reveals lower back stiffness, reduced ROM in neck, restricted mobility in the left shoulder, arthritic changes in both hands. Restricted mobility in the right ankle from a childhood injury, all appear to be stable.   Neurologically:  Mental status: The patient is awake, alert and oriented but history is in large part provided by his wife today.      04/10/2023   12:53 PM  MMSE - Mini Mental State Exam  Orientation to time 5  Orientation to Place 4  Registration 3  Attention/ Calculation 4  Recall 3  Language- name 2 objects 2   Language- repeat 1  Language- follow 3 step command 3  Language- read & follow direction 1  Write a sentence 1  Copy design 0  Total score 27    On 04/10/2023: CDT: 1/4, AFT: 9/min.  Cranial nerves II - XII are as described above under HEENT exam.  Motor exam: no focal atrophy. Strength is globally mildly weak, mild increased tone in the left upper extremity only.   (On 09/16/2018: on Archimedes spiral drawing he has moderate difficulty with the left hand, mild difficulty with right hand. Handwriting with his dominant, right hand is somewhat difficult to read, on the smaller side, not particularly tremulous.)   He has a mild but intermittent resting tremor  in the left hand. He has no other resting tremor.  Fine motor skills are mild to moderately impaired, more pronounced on the left.  Cerebellar testing: No dysmetria or intention tremor.  Sensory exam: intact to light touch.  Gait, station and balance: He stands with difficulty and needs assistance with standing. Posture is moderately stooped with increase in upper back kyphosis noted. He walks with a 2 wheeled walker, slowly and cautiously. Balance is impaired.   Assessment and Plan:    In summary, RANDLE ZEBROWSKI is an 87 year old male with an underlying complex medical history of peptic ulcer disease with history of upper GI bleed, TIA, depression, degenerative disc disease in the neck, osteoarthritis, kidney stones, hyperlipidemia, hypertension, history of hiatal hernia, BPH, arthritis, including ankylosing spondylitis (followed by rheumatology), and parkinsonism, with left-sided predominance, as well as sleep apnea (not on AutoPap therapy), who presents for follow-up consultation of his parkinsonism, complicated by recurrent falls, cognitive decline, mild hallucinations, and sialorrhea.  His DaTscan from 01/07/2019 was supportive for an underlying parkinsonian syndrome.  His diagnostic sleep study on 04/26/2020 showed moderate obstructive  sleep apnea by number of events with an AHI of 18.3/h, but milder desaturations, O2 nadir of 87%. He started autoPap therapy in April 2022.   He no longer uses an AutoPap machine.   He has had decline in mobility and worsening balance, recurrent falls.   We talked about the challenges of advancing Parkinson symptoms.  We may consider Botox injections for sialorrhea but mutually agreed to hold off at this time.  He has had intermittent visual hallucinations which are not disruptive at this time, we will continue to monitor. MMSE today shows mild impairment with a score of 27 out of 30. He does have visual spatial issues and is again advised not to drive. He is advised to use his 2 wheeled walker at all times, we will work on getting him outpatient PT and OT again, I have also placed a referral to home health.  He indicated that he renewed his driver's license but is strongly advised not to drive.  I have signed a DME form for disability placard.  He is advised to continue with generic Sinemet 1 pill 5 times a day.  He is advised to follow-up with the nurse practitioner in 4 to 6 months, sooner if needed.  I answered all their questions today and the patient and his wife are in agreement. I spent 40 minutes in total face-to-face time and in reviewing records during pre-charting, more than 50% of which was spent in counseling and coordination of care, reviewing test results, reviewing medications and treatment regimen and/or in discussing or reviewing the diagnosis of PD, the prognosis and treatment options. Pertinent laboratory and imaging test results that were available during this visit with the patient were reviewed by me and considered in my medical decision making (see chart for details).

## 2023-04-10 NOTE — Patient Instructions (Signed)
I have referred you to neurorehab, PT and OT requesting hopefully 3 sessions a week if possible. I have placed a referral to home health for additional help at home with hygiene such as bathing/showering. We will continue with your levodopa at the current dose. Please use your walker at all times.  Think about getting a life alert button or call alert button similar to that. For any acute symptoms or if you fall, call 911 immediately. Follow-up to see Amy in about 4 months.

## 2023-04-14 ENCOUNTER — Telehealth: Payer: Self-pay | Admitting: Neurology

## 2023-04-14 DIAGNOSIS — D492 Neoplasm of unspecified behavior of bone, soft tissue, and skin: Secondary | ICD-10-CM | POA: Diagnosis not present

## 2023-04-14 DIAGNOSIS — L821 Other seborrheic keratosis: Secondary | ICD-10-CM | POA: Diagnosis not present

## 2023-04-14 DIAGNOSIS — Z85828 Personal history of other malignant neoplasm of skin: Secondary | ICD-10-CM | POA: Diagnosis not present

## 2023-04-14 DIAGNOSIS — L570 Actinic keratosis: Secondary | ICD-10-CM | POA: Diagnosis not present

## 2023-04-14 DIAGNOSIS — L578 Other skin changes due to chronic exposure to nonionizing radiation: Secondary | ICD-10-CM | POA: Diagnosis not present

## 2023-04-14 DIAGNOSIS — Z08 Encounter for follow-up examination after completed treatment for malignant neoplasm: Secondary | ICD-10-CM | POA: Diagnosis not present

## 2023-04-14 NOTE — Telephone Encounter (Signed)
Please give verbal order to [facility] for referral to home health PT.

## 2023-04-14 NOTE — Telephone Encounter (Signed)
I let [facility] know, they will be in contact with patient to get set up.

## 2023-04-14 NOTE — Telephone Encounter (Signed)
noted 

## 2023-04-14 NOTE — Telephone Encounter (Signed)
Please see below message from [facility] regarding referral and let me know if this is ok:   With MD request couple things need to clarify. Aide can not open or stand alone with a patient, OT can not open a patient, but We could do PT to open the patient and work on the balance issues, that would allow OT and Aide to help with ADLs, Hygiene, Bathing,e tc.   Ok to Open with PT/OT/Aide?

## 2023-04-17 NOTE — Telephone Encounter (Signed)
[facility] reached out saying that they tried getting pt scheduled for PT/aide. They said pt was very hesitant to get scheduled, stating he has "a lot going on". He ended up not scheduling and telling them he would reach out if he'd like to schedule. I'm not sure if anyone wanted to call and talk to him about getting the home health or if you all just wanted to leave it up to him?

## 2023-04-17 NOTE — Telephone Encounter (Signed)
Clifton Custard with [facility] will reach out to pt's wife [name].

## 2023-04-23 ENCOUNTER — Ambulatory Visit: Payer: Medicare Other

## 2023-04-23 ENCOUNTER — Other Ambulatory Visit: Payer: Self-pay

## 2023-04-23 ENCOUNTER — Ambulatory Visit: Payer: Medicare Other | Attending: Neurology | Admitting: Occupational Therapy

## 2023-04-23 DIAGNOSIS — M6281 Muscle weakness (generalized): Secondary | ICD-10-CM | POA: Diagnosis not present

## 2023-04-23 DIAGNOSIS — R262 Difficulty in walking, not elsewhere classified: Secondary | ICD-10-CM | POA: Diagnosis not present

## 2023-04-23 DIAGNOSIS — R293 Abnormal posture: Secondary | ICD-10-CM | POA: Diagnosis not present

## 2023-04-23 DIAGNOSIS — R2689 Other abnormalities of gait and mobility: Secondary | ICD-10-CM | POA: Insufficient documentation

## 2023-04-23 DIAGNOSIS — R29818 Other symptoms and signs involving the nervous system: Secondary | ICD-10-CM | POA: Diagnosis not present

## 2023-04-23 DIAGNOSIS — G20A2 Parkinson's disease without dyskinesia, with fluctuations: Secondary | ICD-10-CM | POA: Insufficient documentation

## 2023-04-23 DIAGNOSIS — R278 Other lack of coordination: Secondary | ICD-10-CM | POA: Insufficient documentation

## 2023-04-23 DIAGNOSIS — R2681 Unsteadiness on feet: Secondary | ICD-10-CM

## 2023-04-23 NOTE — Therapy (Signed)
OUTPATIENT OCCUPATIONAL THERAPY PARKINSON'S EVALUATION  Patient Name: Casey SANDEFER MRN: [id number] DOB:[birth year], 87 y.o., male Today's Date: 04/24/2023  PCP: Garlan Fillers, MD REFERRING PROVIDER: Huston Foley, MD  END OF SESSION:  OT End of Session - 04/23/23 1637     Visit Number 1    Number of Visits 17    Date for OT Re-Evaluation 06/20/23    Authorization Type Medicare A &B    OT Start Time 1447    OT Stop Time 1532    OT Time Calculation (min) 45 min             Past Medical History:  Diagnosis Date   Ankylosing spondylitis (HCC) dx'd ~ 1974   Arthritis    "back" (08/10/2015)   BENIGN PROSTATIC HYPERTROPHY, WITH OBSTRUCTION 01/15/2010   Bleeding duodenal ulcer    Bleeding esophageal ulcer    Bleeding stomach ulcer    GERD 02/22/2008   History of blood transfusion 1988   "lost ~ 1/2 of my blood volume from multiple bleeding ulcers"   History of hiatal hernia    HYPERGLYCEMIA 11/18/2007   HYPERLIPIDEMIA 02/22/2008   HYPERTENSION, UNSPECIFIED 11/10/2009   Kidney stones    Osteoarthritis    c-spine   PEPTIC ULCER DISEASE 11/17/2007   Situational depression    "son died in MVA 16-Jun-2015"   TIA (transient ischemic attack)    "not that I know of in the past; they are trying to determine if I've had one today (08/10/2015)"   Past Surgical History:  Procedure Laterality Date   CATARACT EXTRACTION W/ INTRAOCULAR LENS  IMPLANT, BILATERAL Bilateral 2013   Patient Active Problem List   Diagnosis Date Noted   Ankylosing spondylitis of cervicothoracic region (HCC) 10/16/2016   TIA (transient ischemic attack) 08/10/2015   Carotid stenosis 08/04/2014   Hyperlipidemia 02/21/2012   BENIGN PROSTATIC HYPERTROPHY, WITH OBSTRUCTION 01/15/2010   Essential hypertension 11/10/2009   GERD 02/22/2008   NEPHROLITHIASIS, HX OF 11/17/2007    ONSET DATE: referral date 03/25/23  REFERRING DIAG: G20.A2 (ICD-10-CM) - Parkinson's disease without dyskinesia, with fluctuations  R26.81 (ICD-10-CM) - Unsteadiness on feet R41.841 (ICD-10-CM) - Cognitive communication deficit R27.8 (ICD-10-CM) - Other lack of coordination R26.2 (ICD-10-CM) - Difficulty in walking, not elsewhere classified  THERAPY DIAG:  Other symptoms and signs involving the nervous system  Other lack of coordination  Abnormal posture  Unsteadiness on feet  Muscle weakness (generalized)  Rationale for Evaluation and Treatment: Rehabilitation  SUBJECTIVE:   SUBJECTIVE STATEMENT: Pt reports increased neuropathy, decreased stamina.  Pt with multiple falls in early July and a fall July 8 where he had increased confusion and increased pain so wife transported him to ED.  In ED identified cracked ribs and they recommended he may need further medical intervention.  Pt reports difficulty with donning underwear (Depends/pull up style) and spouse identifies socks as a significant challenge. Pt accompanied by: self and significant other (spouse, [name])  PERTINENT HISTORY: history of Parkinson's and ankylosing spondylitis   PRECAUTIONS: Fall  WEIGHT BEARING RESTRICTIONS: No  PAIN:  Are you having pain? Yes: NPRS scale: "stiffness"/10   FALLS: Has patient fallen in last 6 months? Yes. Number of falls has had multiple falls over the last few months with multiple in early July, and then 2 falls this month.  LIVING ENVIRONMENT: Lives with: lives with their spouse Lives in: House/apartment Stairs: Yes: External: 2-3 steps; has a ramp at entrance with raised railings Internal: steps to basement and steps to loft -  however pt does not need to go up/down those at this time. Has following equipment at home: Dan Humphreys - 2 wheeled, Environmental consultant - 4 wheeled, bed side commode, Shower chair, Ramped entry, and transport chair  PLOF: Requires assistive device for independence and Needs assistance with ADLs  PATIENT GOALS: to be able to move without concern/serious concern  OBJECTIVE:   HAND DOMINANCE:  Right  ADLs: Overall ADLs: Pt reports there are times that he can be "fairly independent" with dressing but there are times where spouse has to provide significant assistance with. Transfers/ambulation related to ADLs: Utilizing RW and will occasionally walk bouts through the house without RW.  If pt loses balance backwards he is unable to correct. Eating: "fumble fingered" UB Dressing: occasional assistance LB Dressing: socks and shoes are quite difficult Toileting: "I go to the bathroom by myself" but has difficulty getting up/down from toilet Bathing: washing at the sink, spouse will assist  Tub Shower transfers: pt feels uncomfortable in the shower with shower chair, but has not fallen Equipment: Shower seat with back and Walk in shower with 5.5" ledge with 30" door  IADLs: Light housekeeping: washes dishes when he is feeling strong, however frequent onset of weakness Meal Prep: Pt was primary cook prior to fall in 2023, but has returned to participation in aspects of meal prep Community mobility: recently renewed drivers license, but is not driving  MOBILITY STATUS: Needs Assist: CGA with mobility with RW and Hx of falls   POSTURE COMMENTS:  rounded shoulders and forward head, posterior pelvic tilt and R lean  FUNCTIONAL OUTCOME MEASURES: Fastening/unfastening 3 buttons: unable to button any buttons in 1 mins Physical performance test: PPT#2 (simulated eating) 22.79 sec & PPT#4 (donning/doffing jacket): TBD  COORDINATION: Box and Blocks:  Right 30 blocks, Left 32 blocks  UE ROM:  all movements are slowed, decreased shoulder flexion d/t ankylosing spondylitis   UE MMT:    grossly 4/5 overall  COGNITION: Overall cognitive status:  Spouse reports thinking continues to be slower  OBSERVATIONS: Bradykinesia   TODAY'S TREATMENT:                                                                                                                              DATE:  04/23/23 Educated  on sitting upright in supported sitting for increased trunk control when engaging in self-care tasks as well as other functional tasks as able due to posterior tilt in sitting limiting participation and success.   PATIENT EDUCATION: Education details: Educated on role and purpose of OT as well as potential interventions and goals for therapy based on initial evaluation findings. Person educated: Patient and Spouse Education method: Explanation Education comprehension: verbalized understanding and needs further education  HOME EXERCISE PROGRAM: TBD  GOALS: Goals reviewed with patient? No  SHORT TERM GOALS: Target date: 05/23/23  Pt will be independent in full body HEP with focus on large amplitude movements as needed to increase independence with ADLs.  Baseline: Goal status: INITIAL  2.  Pt will perform dynamic standing task for 5 mins as needed for simple IADLs w/o LOB using DME and/or countertop support prn  Baseline:  Goal status: INITIAL  3.  Pt will demonstrate improved ease with sit > stand to get up from toilet or other seating options in home by improving score on sit > stand to decreased fall risk.  Baseline: time TBD Goal status: INITIAL  4.  Pt will demonstrate improved sitting balance and trunk control to reach towards floor to retreive items to allow progression towards LB dressing. Baseline:  Goal status: INITIAL   LONG TERM GOALS: Target date: 06/20/23  Pt will complete UB dressing, to include clothing fasteners, at Mod I level with use of AE and/or alternative strategies PRN.  Baseline:  Goal status: INITIAL  2.  Pt will be able to don socks and shoes with Supervision/setup with use of AE and/or alternative strategies PRN.  Baseline:  Goal status: INITIAL  3.  Pt will demonstrate increased ease with dressing as evidenced by decreasing PPT#4(don/ doff jacket) by 10 secs or more.  Baseline: time TBD Goal status: INITIAL  4.  Pt will demonstrate improved UE  functional use for ADLs as evidenced by increasing box/ blocks score by 4 blocks with BUE.  Baseline:  Right 30 blocks, Left 32 blocks Goal status: INITIAL  5.  Pt will demonstrate improved dynamic standing balance for 10 mins as needed to engage in ADLs/IADLs with increased safety/endurance.  Baseline:  Goal status: INITIAL  ASSESSMENT:  CLINICAL IMPRESSION: Patient is a 87 y.o. male who was seen today for occupational therapy evaluation for advancements in Parkinson's disease, increased falls, and decreased independence with ADLs/IADLs.  Pt lives with spouse in home where he can reside on main floor with ramped entrance.  Pt continues to require significantly increased time to complete aspects or dressing and/or physical assistance from spouse, particularly with underwear, socks, and shoes as well as buttons. Pt will benefit from skilled occupational therapy services to address strength and coordination, ROM, pain management, balance, GM/FM control, cognition, safety awareness, introduction of compensatory strategies/AE prn, and implementation of an HEP to improve participation and safety during ADLs and IADLs.    PERFORMANCE DEFICITS: in functional skills including ADLs, IADLs, coordination, dexterity, ROM, strength, pain, flexibility, Fine motor control, Gross motor control, balance, body mechanics, endurance, decreased knowledge of precautions, decreased knowledge of use of DME, and UE functional use and psychosocial skills including coping strategies, environmental adaptation, and routines and behaviors.   IMPAIRMENTS: are limiting patient from ADLs and IADLs.   COMORBIDITIES:  may have co-morbidities  that affects occupational performance. Patient will benefit from skilled OT to address above impairments and improve overall function.  MODIFICATION OR ASSISTANCE TO COMPLETE EVALUATION: Min-Moderate modification of tasks or assist with assess necessary to complete an evaluation.  OT  OCCUPATIONAL PROFILE AND HISTORY: Detailed assessment: Review of records and additional review of physical, cognitive, psychosocial history related to current functional performance.  CLINICAL DECISION MAKING: Moderate - several treatment options, min-mod task modification necessary  REHAB POTENTIAL: Good  EVALUATION COMPLEXITY: Moderate    PLAN:  OT FREQUENCY: 2x/week  OT DURATION: 8 weeks  PLANNED INTERVENTIONS: self care/ADL training, therapeutic exercise, therapeutic activity, neuromuscular re-education, balance training, functional mobility training, ultrasound, compression bandaging, moist heat, cryotherapy, patient/family education, cognitive remediation/compensation, psychosocial skills training, energy conservation, coping strategies training, and DME and/or AE instructions  RECOMMENDED OTHER SERVICES: NA  CONSULTED AND AGREED WITH PLAN  OF CARE: Patient  PLAN FOR NEXT SESSION: assess don/doff jacket, initiate large amplitude HEP, trial bag exercises   Zhana Jeangilles, OTR/L 04/24/2023, 8:47 AM

## 2023-04-23 NOTE — Therapy (Unsigned)
OUTPATIENT PHYSICAL THERAPY NEURO EVALUATION   Patient Name: [name] MRN: [id number] DOB:04/08/1936, 87 y.o., male Today's Date: 04/24/2023   PCP: Garlan Fillers REFERRING PROVIDER: Huston Foley, MD  END OF SESSION:  PT End of Session - 04/23/23 1408     Visit Number 1    Number of Visits 12    Date for PT Re-Evaluation 06/04/23    Authorization Type Medicare/Medicaid    Progress Note Due on Visit 10    PT Start Time 1400    PT Stop Time 1445    PT Time Calculation (min) 45 min             Past Medical History:  Diagnosis Date   Ankylosing spondylitis (HCC) dx'd ~ 1974   Arthritis    "back" (08/10/2015)   BENIGN PROSTATIC HYPERTROPHY, WITH OBSTRUCTION 01/15/2010   Bleeding duodenal ulcer    Bleeding esophageal ulcer    Bleeding stomach ulcer    GERD 02/22/2008   History of blood transfusion 1988   "lost ~ 1/2 of my blood volume from multiple bleeding ulcers"   History of hiatal hernia    HYPERGLYCEMIA 11/18/2007   HYPERLIPIDEMIA 02/22/2008   HYPERTENSION, UNSPECIFIED 11/10/2009   Kidney stones    Osteoarthritis    c-spine   PEPTIC ULCER DISEASE 11/17/2007   Situational depression    "son died in MVA 06/22/2015"   TIA (transient ischemic attack)    "not that I know of in the past; they are trying to determine if I've had one today (08/10/2015)"   Past Surgical History:  Procedure Laterality Date   CATARACT EXTRACTION W/ INTRAOCULAR LENS  IMPLANT, BILATERAL Bilateral 2013   Patient Active Problem List   Diagnosis Date Noted   Ankylosing spondylitis of cervicothoracic region (HCC) 10/16/2016   TIA (transient ischemic attack) 08/10/2015   Carotid stenosis 08/04/2014   Hyperlipidemia 02/21/2012   BENIGN PROSTATIC HYPERTROPHY, WITH OBSTRUCTION 01/15/2010   Essential hypertension 11/10/2009   GERD 02/22/2008   NEPHROLITHIASIS, HX OF 11/17/2007    ONSET DATE: "several years"  REFERRING DIAG: G20.A2 (ICD-10-CM) - Parkinson's disease with fluctuating  manifestations, unspecified whether dyskinesia present R26.81 (ICD-10-CM) - Unsteadiness on feet R41.841 (ICD-10-CM) - Cognitive communication deficit R27.8 (ICD-10-CM) - Other lack of coordination R26.2 (ICD-10-CM) - Difficulty in walking, not elsewhere classified  THERAPY DIAG:  Parkinson's disease with fluctuating manifestations, unspecified whether dyskinesia present  Unsteadiness on feet  Difficulty in walking, not elsewhere classified  Muscle weakness (generalized)  Abnormal posture  Other abnormalities of gait and mobility  Rationale for Evaluation and Treatment: Rehabilitation  SUBJECTIVE:  SUBJECTIVE STATEMENT: Increased falls, reduced gait speed, increased need for physical assistance in functional mobility Pt accompanied by: significant other  PERTINENT HISTORY: PD, complex medical history of peptic ulcer disease with history of upper GI bleed, TIA, depression, degenerative disc disease in the neck, osteoarthritis, kidney stones, hyperlipidemia, hypertension, history of hiatal hernia, BPH, arthritis, and ankylosing spondylitis, and parkinsonism   PAIN:  Are you having pain? Yes: NPRS scale: 6/10 Pain location: thighs, left foot Pain description: sore Aggravating factors: standing, walking Relieving factors: rest  PRECAUTIONS: Fall  RED FLAGS: None   WEIGHT BEARING RESTRICTIONS: No  FALLS: Has patient fallen in last 6 months? Yes. Number of falls unknown, multiple  LIVING ENVIRONMENT: Lives with: lives with their spouse Lives in: House/apartment Stairs: Ramp to enter, stairs to loft and basement, ground floor set-up Has following equipment at home: Dan Humphreys - 2 wheeled  PLOF: Needs assistance with ADLs, Needs assistance with homemaking, and Needs assistance with gait,  supervision for ambulation at household distances  PATIENT GOALS: improve balance, walking, reduce falls  OBJECTIVE:   DIAGNOSTIC FINDINGS: n/a for episode  COGNITION: Overall cognitive status: History of cognitive impairments - at baseline   SENSATION: Neuropathy in bilat feet  COORDINATION: Difficulty with rapid alternating movements    MUSCLE TONE: NT   POSTURE: rounded shoulders, forward head, flexed trunk , and head down , kyphosis  LOWER EXTREMITY ROM:     Active  Right Eval Left Eval  Hip flexion    Hip extension    Hip abduction    Hip adduction    Hip internal rotation    Hip external rotation    Knee flexion 120 120  Knee extension 0 0  Ankle dorsiflexion 5 12  Ankle plantarflexion    Ankle inversion    Ankle eversion     (Blank rows = not tested)  LOWER EXTREMITY MMT:    BLE strength grossly 3+/5  BED MOBILITY:  Mod A  TRANSFERS: Assistive device utilized: Environmental consultant - 2 wheeled  Sit to stand: CGA and Min A Stand to sit: SBA Chair to chair: SBA and CGA Floor: Mod A and Max A  RAMP:  Level of Assistance: CGA Assistive device utilized: Environmental consultant - 2 wheeled Ramp Comments:   CURB:  Level of Assistance: CGA Assistive device utilized: Environmental consultant - 2 wheeled Curb Comments:   STAIRS: NT  GAIT: Gait pattern: step to pattern, decreased stride length, and poor foot clearance- Right Distance walked: 100 Assistive device utilized: Walker - 2 wheeled Level of assistance: SBA and CGA Comments: level surfaces  FUNCTIONAL TESTS:  5 times sit to stand: 45 sec w/ CGA Timed up and go (TUG): 1 min 27 sec  M-CTSIB  Condition 1: Firm Surface, EO 10 Sec, Severe and retro-LOB  Sway  Condition 2: Firm Surface, EC 3 Sec, Severe and retro-LOB  Sway  Condition 3: Foam Surface, EO  Sec,  Sway  Condition 4: Foam Surface, EC  Sec,  Sway      TODAY'S TREATMENT:  DATE: 04/23/23 Gait training: trials with visual cue on RW for step length. Training in U-step for stride length    PATIENT EDUCATION: Education details: assessment details, gait training in use of visual cues/reference Person educated: Patient and Spouse Education method: Explanation Education comprehension: verbalized understanding  HOME EXERCISE PROGRAM: TBD  GOALS: Goals reviewed with patient? Yes  SHORT TERM GOALS: Target date: 05/15/2023    Patient will be independent in HEP to improve functional outcomes Baseline: Goal status: INITIAL  2.  Teach-back strategies to reduce freezing of gait during turns Baseline: 16 steps to complete 180 deg turn Goal status: INITIAL  3.  Demo improved safety and independence with sit to stand completing transfers with set-up assist Baseline: CGA-min A Goal status: INITIAL    LONG TERM GOALS: Target date: 06/05/2023    Demo reduced risk for falls per time 35 sec TUG test Baseline: 1 min 27 sec Goal status: INITIAL  2.  Demo improved BLE strength and balance per time of 25 sec 5xSTS test to increase independence with mobility Baseline: 45 sec Goal status: INITIAL  3.  Demo improved safety with ambulation on level surfaces w/ supervision x 150 ft Baseline: SBA-CGA Goal status: INITIAL    ASSESSMENT:  CLINICAL IMPRESSION: Patient is a 87 y.o. male who was seen today for physical therapy evaluation and treatment for parkinson's disease and related mobility deficits.  Pt exhibits generalized weakness of BLE and difficulty in completing functional mobility requiring increased need for physical assistance from caregivers and increased difficulty with walking and hx of falling.  Demo high risk for falls per outcome measures and limited postural stability in unsupported standing.  Pt would benefit from PT interventions to address deficits and limitations to reduce risk for falls and level of assistance    OBJECTIVE IMPAIRMENTS: Abnormal gait, decreased activity tolerance, decreased balance, decreased coordination, decreased mobility, difficulty walking, decreased ROM, decreased strength, improper body mechanics, and postural dysfunction.   ACTIVITY LIMITATIONS: carrying, lifting, standing, stairs, transfers, bed mobility, and locomotion level  PARTICIPATION LIMITATIONS: meal prep, cleaning, laundry, interpersonal relationship, community activity, and exercise routine  PERSONAL FACTORS: Age, Time since onset of injury/illness/exacerbation, and 3+ comorbidities: PMH  are also affecting patient's functional outcome.   REHAB POTENTIAL: Good  CLINICAL DECISION MAKING: Evolving/moderate complexity  EVALUATION COMPLEXITY: Moderate  PLAN:  PT FREQUENCY: 1-2x/week  PT DURATION: 6 weeks  PLANNED INTERVENTIONS: Therapeutic exercises, Therapeutic activity, Neuromuscular re-education, Balance training, Gait training, Patient/Family education, Self Care, and Joint mobilization  PLAN FOR NEXT SESSION: carryover of gait strategies for stride?, U-step trials?, HEP for LE strength (sit-stand at sink)   12:50 PM, 04/24/23 M. Shary Decamp, PT, DPT Physical Therapist- [place] Office Number: [phone]

## 2023-04-24 ENCOUNTER — Emergency Department (HOSPITAL_BASED_OUTPATIENT_CLINIC_OR_DEPARTMENT_OTHER): Payer: Medicare Other | Admitting: Radiology

## 2023-04-24 ENCOUNTER — Emergency Department (HOSPITAL_BASED_OUTPATIENT_CLINIC_OR_DEPARTMENT_OTHER)
Admission: EM | Admit: 2023-04-24 | Discharge: 2023-04-25 | Disposition: A | Discharge status: Home / Self Care | Payer: Medicare Other | Source: Home / Self Care | Attending: Emergency Medicine | Admitting: Emergency Medicine

## 2023-04-24 ENCOUNTER — Emergency Department (HOSPITAL_BASED_OUTPATIENT_CLINIC_OR_DEPARTMENT_OTHER): Payer: Medicare Other

## 2023-04-24 DIAGNOSIS — S0990XA Unspecified injury of head, initial encounter: Secondary | ICD-10-CM

## 2023-04-24 DIAGNOSIS — Z7982 Long term (current) use of aspirin: Secondary | ICD-10-CM | POA: Insufficient documentation

## 2023-04-24 DIAGNOSIS — Y92002 Bathroom of unspecified non-institutional (private) residence single-family (private) house as the place of occurrence of the external cause: Secondary | ICD-10-CM | POA: Insufficient documentation

## 2023-04-24 DIAGNOSIS — M549 Dorsalgia, unspecified: Secondary | ICD-10-CM | POA: Diagnosis not present

## 2023-04-24 DIAGNOSIS — M25521 Pain in right elbow: Secondary | ICD-10-CM | POA: Diagnosis not present

## 2023-04-24 DIAGNOSIS — M79601 Pain in right arm: Secondary | ICD-10-CM | POA: Diagnosis present

## 2023-04-24 DIAGNOSIS — M47812 Spondylosis without myelopathy or radiculopathy, cervical region: Secondary | ICD-10-CM | POA: Diagnosis not present

## 2023-04-24 DIAGNOSIS — W01198A Fall on same level from slipping, tripping and stumbling with subsequent striking against other object, initial encounter: Secondary | ICD-10-CM | POA: Insufficient documentation

## 2023-04-24 DIAGNOSIS — S4991XA Unspecified injury of right shoulder and upper arm, initial encounter: Secondary | ICD-10-CM

## 2023-04-24 DIAGNOSIS — G20C Parkinsonism, unspecified: Secondary | ICD-10-CM | POA: Diagnosis not present

## 2023-04-24 DIAGNOSIS — S60011A Contusion of right thumb without damage to nail, initial encounter: Secondary | ICD-10-CM | POA: Diagnosis not present

## 2023-04-24 DIAGNOSIS — S5001XA Contusion of right elbow, initial encounter: Secondary | ICD-10-CM | POA: Insufficient documentation

## 2023-04-24 DIAGNOSIS — M50322 Other cervical disc degeneration at C5-C6 level: Secondary | ICD-10-CM | POA: Diagnosis not present

## 2023-04-24 DIAGNOSIS — Y9301 Activity, walking, marching and hiking: Secondary | ICD-10-CM | POA: Insufficient documentation

## 2023-04-24 DIAGNOSIS — Z8669 Personal history of other diseases of the nervous system and sense organs: Secondary | ICD-10-CM

## 2023-04-24 DIAGNOSIS — M4802 Spinal stenosis, cervical region: Secondary | ICD-10-CM | POA: Diagnosis not present

## 2023-04-24 DIAGNOSIS — M79641 Pain in right hand: Secondary | ICD-10-CM | POA: Diagnosis not present

## 2023-04-24 DIAGNOSIS — M19041 Primary osteoarthritis, right hand: Secondary | ICD-10-CM | POA: Diagnosis not present

## 2023-04-24 DIAGNOSIS — I10 Essential (primary) hypertension: Secondary | ICD-10-CM | POA: Diagnosis not present

## 2023-04-24 DIAGNOSIS — M47816 Spondylosis without myelopathy or radiculopathy, lumbar region: Secondary | ICD-10-CM | POA: Diagnosis not present

## 2023-04-24 DIAGNOSIS — W19XXXA Unspecified fall, initial encounter: Secondary | ICD-10-CM

## 2023-04-24 LAB — BASIC METABOLIC PANEL
Anion gap: 7 (ref 5–15)
BUN: 21 mg/dL (ref 8–23)
CO2: 29 mmol/L (ref 22–32)
Calcium: 9.4 mg/dL (ref 8.9–10.3)
Chloride: 104 mmol/L (ref 98–111)
Creatinine, Ser: 1.08 mg/dL (ref 0.61–1.24)
GFR, Estimated: >60 mL/min (ref >60)
Glucose, Bld: 172 mg/dL — ABNORMAL HIGH (ref 70–99)
Potassium: 3.9 mmol/L (ref 3.5–5.1)
Sodium: 140 mmol/L (ref 135–145)

## 2023-04-24 LAB — CBC
HCT: 38.9 % — ABNORMAL LOW (ref 39.0–52.0)
Hemoglobin: 13.1 g/dL (ref 13.0–17.0)
MCH: 33.7 pg (ref 26.0–34.0)
MCHC: 33.7 g/dL (ref 30.0–36.0)
MCV: 100 fL (ref 80.0–100.0)
Platelets: 201 10*3/uL (ref 150–400)
RBC: 3.89 MIL/uL — ABNORMAL LOW (ref 4.22–5.81)
RDW: 12.9 % (ref 11.5–15.5)
WBC: 6.5 10*3/uL (ref 4.0–10.5)
nRBC: 0 % (ref 0.0–0.2)

## 2023-04-24 MED ORDER — VITAMIN D 25 MCG (1000 UNIT) PO TABS
1000.0000 [IU] | ORAL_TABLET | Freq: Every day | ORAL | Status: DC
  Administered 2023-04-24 – 2023-04-25 (×2): 1000 [IU] via ORAL
  Filled 2023-04-24 (×2): qty 1

## 2023-04-24 MED ORDER — ACETAMINOPHEN 325 MG PO TABS
650.0000 mg | ORAL_TABLET | Freq: Four times a day (QID) | ORAL | Status: DC | PRN
  Administered 2023-04-24 – 2023-04-25 (×2): 650 mg via ORAL
  Filled 2023-04-24 (×2): qty 2

## 2023-04-24 MED ORDER — LOSARTAN POTASSIUM-HCTZ 50-12.5 MG PO TABS: 0.5000 | ORAL_TABLET | Freq: Every day | ORAL | Status: DC

## 2023-04-24 MED ORDER — CARBIDOPA-LEVODOPA 25-100 MG PO TABS
1.0000 | ORAL_TABLET | Freq: Every day | ORAL | Status: DC
  Administered 2023-04-24 – 2023-04-25 (×5): 1 via ORAL
  Filled 2023-04-24 (×6): qty 1

## 2023-04-24 MED ORDER — ASPIRIN 81 MG PO TBEC
81.0000 mg | DELAYED_RELEASE_TABLET | Freq: Every day | ORAL | Status: DC
  Administered 2023-04-25: 81 mg via ORAL
  Filled 2023-04-24: qty 1

## 2023-04-24 MED ORDER — ATORVASTATIN CALCIUM 10 MG PO TABS
10.0000 mg | ORAL_TABLET | Freq: Every day | ORAL | Status: DC
  Administered 2023-04-25: 10 mg via ORAL
  Filled 2023-04-24 (×2): qty 1

## 2023-04-24 MED ORDER — LOSARTAN POTASSIUM 25 MG PO TABS
25.0000 mg | ORAL_TABLET | Freq: Every day | ORAL | Status: DC
  Administered 2023-04-25: 25 mg via ORAL
  Filled 2023-04-24: qty 1

## 2023-04-24 MED ORDER — PANTOPRAZOLE SODIUM 20 MG PO TBEC
20.0000 mg | DELAYED_RELEASE_TABLET | Freq: Every day | ORAL | Status: DC
  Administered 2023-04-25: 20 mg via ORAL
  Filled 2023-04-24: qty 1

## 2023-04-24 MED ORDER — LEVOTHYROXINE SODIUM 50 MCG PO TABS
50.0000 ug | ORAL_TABLET | Freq: Every day | ORAL | Status: DC
  Administered 2023-04-25: 50 ug via ORAL
  Filled 2023-04-24: qty 1

## 2023-04-24 MED ORDER — HYDROCHLOROTHIAZIDE 12.5 MG PO TABS
6.2500 mg | ORAL_TABLET | Freq: Every day | ORAL | Status: DC
  Administered 2023-04-25: 6.25 mg via ORAL
  Filled 2023-04-24: qty 1

## 2023-04-24 NOTE — ED Notes (Signed)
Patient arrived from [place]. Was told from PT that he needs inpatient rehab.

## 2023-04-24 NOTE — ED Notes (Signed)
PT at bedside.

## 2023-04-24 NOTE — ED Provider Notes (Signed)
[place] EMERGENCY DEPARTMENT AT [facility] Provider Note   CSN: [id number] Arrival date & time: 04/24/23  1123     History  Chief Complaint  Patient presents with   [name] is a 87 y.o. male.  87 year old male with a history of Parkinson's who presents emergency department after a fall.  Patient does have a history of Parkinson's and walks with a walker.  Has been having frequent falls recently.  Had 1 today where he was walking out of the restroom and then says that he started backpedaling due to his Parkinson's.  Larey Seat and struck his head on the bathtub.  No bleeding.  On aspirin but no other blood thinners.  Does not believe he lost consciousness.  Also having low back pain from a fall several days ago.  Lives at home with his wife.  They are being evaluated for by physical therapy but do not have an appointment for 2 weeks.       Home Medications Prior to Admission medications   Medication Sig Start Date End Date Taking? Authorizing Provider  aspirin 81 MG tablet Take 81 mg by mouth daily.      [provider]  atorvastatin (LIPITOR) 10 MG tablet TAKE 1 TABLET BY MOUTH DAILY 03/04/23   [place], Peter M, MD  carbidopa-levodopa (SINEMET IR) 25-100 MG tablet Take 1 tablet by mouth 5 (five) times daily. Take at 6, 10, 2 PM, 6 PM, 9 PM daily. 07/03/22   Huston Foley, MD  cholecalciferol (VITAMIN D3) 25 MCG (1000 UNIT) tablet Take 1 tablet by mouth daily. 04/13/19   [provider]  levothyroxine (SYNTHROID) 50 MCG tablet Take 50 mcg by mouth daily. 01/23/21   [provider]  losartan-hydrochlorothiazide (HYZAAR) 50-12.5 MG tablet TAKE ONE-HALF TABLET BY MOUTH  DAILY 03/04/23   [place], Peter M, MD  Multiple Vitamin (MULTIVITAMIN WITH MINERALS) TABS tablet Take 1 tablet by mouth daily.    [provider]  pantoprazole (PROTONIX) 20 MG tablet Take 20 mg by mouth daily. 03/09/20   [provider]  Secukinumab (COSENTYX [place])  Inject into the skin every 30 (thirty) days.    [provider]  sulindac (CLINORIL) 150 MG tablet Take 150 mg by mouth daily as needed.    [provider]      Allergies    Patient has no known allergies.    Review of Systems   Review of Systems  Physical Exam Updated Vital Signs BP (!) 97/46   Pulse 92   Temp 97.9 F (36.6 C) (Oral)   Resp 18   SpO2 100%  Physical Exam Vitals and nursing note reviewed.  Constitutional:      General: He is not in acute distress.    Appearance: Normal appearance. He is well-developed. He is not ill-appearing.  HENT:     Head: Normocephalic and atraumatic.     Right Ear: External ear normal.     Left Ear: External ear normal.     Nose: Nose normal.     Mouth/Throat:     Mouth: Mucous membranes are moist.     Pharynx: Oropharynx is clear.  Eyes:     Extraocular Movements: Extraocular movements intact.     Conjunctiva/sclera: Conjunctivae normal.     Pupils: Pupils are equal, round, and reactive to light.  Neck:     Comments: No C-spine midline tenderness to palpation Cardiovascular:     Rate and Rhythm: Normal rate and regular  rhythm.     Pulses: Normal pulses.     Heart sounds: Normal heart sounds.  Pulmonary:     Effort: Pulmonary effort is normal. No respiratory distress.     Breath sounds: Normal breath sounds.  Abdominal:     General: Abdomen is flat. There is no distension.     Palpations: Abdomen is soft. There is no mass.     Tenderness: There is no abdominal tenderness. There is no guarding.  Musculoskeletal:        General: No deformity. Normal range of motion.     Cervical back: Normal range of motion and neck supple. No rigidity or tenderness.     Right lower leg: No edema.     Left lower leg: No edema.     Comments: No tenderness to palpation of midline thoracic spine.  Very mild midline tenderness to palpation approximately L2 region.  No step-offs palpated.  No tenderness to palpation of chest  wall.  No bruising noted.  No tenderness to palpation of bilateral clavicles.  No tenderness to palpation, bruising, or deformities noted of bilateral shoulders, wrists, hips, knees, or ankles.  Tenderness palpation and bruising of right elbow.  Tenderness palpation at the base of the left thumb and second finger MCP.  Some bruising in this region as well.  No snuffbox tenderness of the right wrist.  Skin:    General: Skin is warm and dry.  Neurological:     General: No focal deficit present.     Mental Status: He is alert and oriented to person, place, and time. Mental status is at baseline.     Cranial Nerves: No cranial nerve deficit.     Sensory: No sensory deficit.     Motor: No weakness.  Psychiatric:        Mood and Affect: Mood normal.        Behavior: Behavior normal.     ED Results / Procedures / Treatments   Labs (all labs ordered are listed, but only abnormal results are displayed) Labs Reviewed  BASIC METABOLIC PANEL - Abnormal; Notable for the following components:      Result Value   Glucose, Bld 172 (*)    All other components within normal limits  CBC - Abnormal; Notable for the following components:   RBC 3.89 (*)    HCT 38.9 (*)    All other components within normal limits    EKG EKG Interpretation Date/Time:  Thursday April 24 2023 11:32:42 EDT Ventricular Rate:  93 PR Interval:  201 QRS Duration:  75 QT Interval:  337 QTC Calculation: 420 R Axis:   28  Text Interpretation: Sinus rhythm Confirmed by Vonita Porcelli [phone]) on 04/24/2023 11:37:58 AM  Radiology DG Hand Complete Right  Result Date: 04/24/2023 CLINICAL DATA:  Fall, pain, bruising to base of thumb EXAM: RIGHT HAND - COMPLETE 3+ VIEW COMPARISON:  None Available. FINDINGS: There is no evidence of fracture or dislocation. 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 Soft tissues are unremarkable.  IMPRESSION: 1. No fracture or dislocation of the right hand. 2. 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 Electronically Signed   By: Jearld Lesch M.D.   On: 04/24/2023 13:21   DG Lumbar Spine Complete  Result Date: 04/24/2023 CLINICAL DATA:  Fall, back pain EXAM: LUMBAR SPINE - COMPLETE 4+ VIEW COMPARISON:  Lumbar spine CT  03/03/2023 FINDINGS: There are 5 non-rib-bearing lumbar-type vertebral bodies. Vertebral body heights are preserved, without evidence of acute fracture. There are bilateral L5-S1 pars defects as seen on prior CT. Alignment is normal, without resulting anterolisthesis. There is overall mild multilevel degenerative change in the lumbar spine. The SI joints and symphysis pubis are intact. The soft tissues are unremarkable. IMPRESSION: 1. No acute finding in the lumbar spine. 2. Unchanged bilateral L5-S1 pars defects. Electronically Signed   By: Lesia Hausen M.D.   On: 04/24/2023 13:17   DG Elbow Complete Right  Result Date: 04/24/2023 CLINICAL DATA:  Pain in the elbow after fall EXAM: RIGHT ELBOW - COMPLETE 3+ VIEW COMPARISON:  None Available. FINDINGS: There is no acute fracture or dislocation. Elbow alignment is maintained. The joint spaces are preserved. There is no erosive change. The soft tissues are unremarkable. IMPRESSION: Negative. Electronically Signed   By: Lesia Hausen M.D.   On: 04/24/2023 13:14   CT Head Wo Contrast  Result Date: 04/24/2023 CLINICAL DATA:  Provided history: Neck trauma. Head trauma, minor. Fall (striking back of head). EXAM: CT HEAD WITHOUT CONTRAST CT CERVICAL SPINE WITHOUT CONTRAST TECHNIQUE: Multidetector CT imaging of the head and cervical spine was performed following the standard protocol without intravenous contrast. Multiplanar CT image reconstructions of the cervical spine were also generated. RADIATION DOSE REDUCTION: This exam was performed according  to the departmental dose-optimization program which includes automated exposure control, adjustment of the mA and/or kV according to patient size and/or use of iterative reconstruction technique. COMPARISON:  Head CT 03/03/2023.  Cervical spine CT 03/03/2023. FINDINGS: CT HEAD FINDINGS Brain: Generalized cerebral atrophy. There is no acute intracranial hemorrhage. No demarcated cortical infarct. No extra-axial fluid collection. No evidence of an intracranial mass. No midline shift. Vascular: No hyperdense vessel.  Atherosclerotic calcification Skull: No calvarial fracture or aggressive osseous lesion. Sinuses/Orbits: No mass or acute finding within the imaged orbits. No significant paranasal sinus disease at the imaged levels. CT CERVICAL SPINE FINDINGS Alignment: Mild grade 1 anterolisthesis at C6-C7. Skull base and vertebrae: The basion-dental and atlanto-dental intervals are maintained.No evidence of acute fracture to the cervical spine. Known chronic T3 vertebral compression fracture, incompletely imaged. Soft tissues and spinal canal: No prevertebral fluid or swelling. No visible canal hematoma. Disc levels: Cervical spondylosis with multilevel disc space narrowing, disc bulges/central disc protrusions, endplate spurring, uncovertebral hypertrophy and facet arthrosis. Disc space narrowing is greatest at C5-C6 (moderate-to-advanced) and C6-C7 (advanced). No appreciable high-grade spinal canal stenosis. Multilevel bony neural foraminal narrowing. Upper chest: No consolidation within the imaged lung apices. No visible pneumothorax. Other: 4.4 x 2.0 x 2.9 cm lipoma within the posterior right upper neck (for instance as seen on series 4, image 28) (series 6, image 62). IMPRESSION: CT head: 1. No evidence of an acute intracranial abnormality. 2. Generalized cerebral atrophy. CT cervical spine: 1. No evidence of an acute cervical spine fracture. 2. Mild C6-C7 grade 1 anterolisthesis, unchanged from the prior  examination of 03/03/2023. 3. Cervical spondylosis as described. 4. Known chronic T3 vertebral compression fracture, incompletely imaged. Electronically Signed   By: Jackey Loge D.O.   On: 04/24/2023 13:02   CT Cervical Spine Wo Contrast  Result Date: 04/24/2023 CLINICAL DATA:  Provided history: Neck trauma. Head trauma, minor. Fall (striking back of head). EXAM: CT HEAD WITHOUT CONTRAST CT CERVICAL SPINE WITHOUT CONTRAST TECHNIQUE: Multidetector CT imaging of the head and cervical spine was performed following the standard protocol without intravenous contrast. Multiplanar CT image reconstructions of the  cervical spine were also generated. RADIATION DOSE REDUCTION: This exam was performed according to the departmental dose-optimization program which includes automated exposure control, adjustment of the mA and/or kV according to patient size and/or use of iterative reconstruction technique. COMPARISON:  Head CT 03/03/2023.  Cervical spine CT 03/03/2023. FINDINGS: CT HEAD FINDINGS Brain: Generalized cerebral atrophy. There is no acute intracranial hemorrhage. No demarcated cortical infarct. No extra-axial fluid collection. No evidence of an intracranial mass. No midline shift. Vascular: No hyperdense vessel.  Atherosclerotic calcification Skull: No calvarial fracture or aggressive osseous lesion. Sinuses/Orbits: No mass or acute finding within the imaged orbits. No significant paranasal sinus disease at the imaged levels. CT CERVICAL SPINE FINDINGS Alignment: Mild grade 1 anterolisthesis at C6-C7. Skull base and vertebrae: The basion-dental and atlanto-dental intervals are maintained.No evidence of acute fracture to the cervical spine. Known chronic T3 vertebral compression fracture, incompletely imaged. Soft tissues and spinal canal: No prevertebral fluid or swelling. No visible canal hematoma. Disc levels: Cervical spondylosis with multilevel disc space narrowing, disc bulges/central disc protrusions,  endplate spurring, uncovertebral hypertrophy and facet arthrosis. Disc space narrowing is greatest at C5-C6 (moderate-to-advanced) and C6-C7 (advanced). No appreciable high-grade spinal canal stenosis. Multilevel bony neural foraminal narrowing. Upper chest: No consolidation within the imaged lung apices. No visible pneumothorax. Other: 4.4 x 2.0 x 2.9 cm lipoma within the posterior right upper neck (for instance as seen on series 4, image 28) (series 6, image 62). IMPRESSION: CT head: 1. No evidence of an acute intracranial abnormality. 2. Generalized cerebral atrophy. CT cervical spine: 1. No evidence of an acute cervical spine fracture. 2. Mild C6-C7 grade 1 anterolisthesis, unchanged from the prior examination of 03/03/2023. 3. Cervical spondylosis as described. 4. Known chronic T3 vertebral compression fracture, incompletely imaged. Electronically Signed   By: Jackey Loge D.O.   On: 04/24/2023 13:02    Procedures Procedures    Medications Ordered in ED Medications  aspirin EC tablet 81 mg (has no administration in time range)  atorvastatin (LIPITOR) tablet 10 mg (has no administration in time range)  carbidopa-levodopa (SINEMET IR) 25-100 MG per tablet immediate release 1 tablet (has no administration in time range)  cholecalciferol (VITAMIN D3) 25 MCG (1000 UNIT) tablet 1,000 Units (has no administration in time range)  levothyroxine (SYNTHROID) tablet 50 mcg (has no administration in time range)  losartan-hydrochlorothiazide (HYZAAR) 50-12.5 MG per tablet 0.5 tablet (has no administration in time range)  pantoprazole (PROTONIX) EC tablet 20 mg (has no administration in time range)    ED Course/ Medical Decision Making/ A&P                                 Medical Decision Making Amount and/or Complexity of Data Reviewed Radiology: ordered.   [name] is a 87 y.o. male with comorbidities that complicate the patient evaluation including Parkinson's who presents to the emergency  department after a fall  Initial Ddx:  Mechanical fall, syncope, TBI, concussion, cervical spine injury, lumbar spine fracture, elbow fracture, hand fracture  MDM/Course:  Patient presents to the emergency department after mechanical fall.  Did not have a syncopal event or loss of consciousness.  On exam does have some bruising of his right elbow and hand.  Given his age did obtain a CT spine of the head and C-spine which did not show any acute abnormalities.  Also had x-rays of his right upper extremity that did not show fracture.  Lumbar spine x-ray did not reveal any acute fractures.  We evaluated by physical therapy here in the emergency department who thought that he may benefit from inpatient rehab.  Especially since he lives at home with his wife and is unable to ambulate safely.  Patient placed in TOC boarder status.  Signed out to the oncoming physician (Dr. Renaye Rakers) because he will need to be transferred to [facility] to be seen by physical therapy.  He is sending labs at this time as well.    This patient presents to the ED for concern of complaints listed in HPI, this involves an extensive number of treatment options, and is a complaint that carries with it a high risk of complications and morbidity. Disposition including potential need for admission considered.   Dispo: TOC border  Additional history obtained from spouse Records reviewed Outpatient Clinic Notes I independently reviewed the following imaging with scope of interpretation limited to determining acute life threatening conditions related to emergency care: CT Head and agree with the radiologist interpretation with the following exceptions: none I personally reviewed and interpreted the pt's EKG: see above for interpretation  I have reviewed the patients home medications and made adjustments as needed Consults: TOC and physical therapy Social Determinants of health:  Elderly         Final Clinical  Impression(s) / ED Diagnoses Final diagnoses:  History of Parkinson's disease  Fall, initial encounter  Injury of head, initial encounter  Arm injury, right, initial encounter    Rx / DC Orders ED Discharge Orders     None         Rondel Baton, MD 04/24/23 [phone]

## 2023-04-24 NOTE — ED Notes (Signed)
[name] with cl called for transport

## 2023-04-24 NOTE — ED Notes (Signed)
[facility] at bedside 

## 2023-04-24 NOTE — ED Triage Notes (Signed)
Pt states that this morning he was coming out of the bathroom and tripped over a laundry hamper, falling backwards into the tub. Pt did strike the posterior portion of his head, denies LOC. A+Ox4 on arrival. Pt also c/o bilateral shoulder pain and R wrist pain. Pt not on blood thinners.

## 2023-04-24 NOTE — ED Notes (Signed)
Patient requires max 1 person assist to ambulate. Shuffled gait and backward swaying noted.

## 2023-04-24 NOTE — Progress Notes (Addendum)
Transition of Care [facility]) - Emergency Department Mini Assessment   Patient Details  Name: [name] MRN: [id number] Date of Birth: 11-13-1935  [facility] CM/SW Contact:    Georgie Chard, LCSW Phone Number: 04/24/2023, 7:15 PM   Clinical Narrative: Patient has arrived from DB.CSW has met patient at bedside at this time patient is aware of CIR recommendations. This CSW has sent a chat to [place] with CIR to review the patient. AT this time TOC will follow in the event that the patient does not go to CIR family can consider a SNF. PASRR went to level two clinical documents were uploaded into  must for patient in the case SNF has to be an option. TOC following.    Addend@ 7:59 pm   This CSW spoke to the patient's wife [name] # [phone]  AT this time the patient's wife is wanting the husband to go to CIR per PT recommendations from DB. This CSW explained that the referral was sent to CIR and they will review in the AM. CSW did speak to wife about SNF in the case of the patient not being able to go to CIR. At this time wife stated that she was not sure about SNF at this time due to the amount of PT hours they may not provide . CSW advised wife that [facility] will continue to follow and provide update about CIR once determination is made. Patient and wife are aware he will stay in ED during this process.  ED Mini Assessment:    Barriers to Discharge: (P) No Barriers Identified        Interventions which prevented an admission or readmission: (P) SNF Placement (CIR)    Patient Contact and Communications     Spoke with: (P) Patient Contact Date: (P) 04/17/23,   Contact time: (P) 1900             Admission diagnosis:  fall Patient Active Problem List   Diagnosis Date Noted   Ankylosing spondylitis of cervicothoracic region (HCC) 10/16/2016   TIA (transient ischemic attack) 08/10/2015   Carotid stenosis 08/04/2014   Hyperlipidemia 02/21/2012   BENIGN  PROSTATIC HYPERTROPHY, WITH OBSTRUCTION 01/15/2010   Essential hypertension 11/10/2009   GERD 02/22/2008   NEPHROLITHIASIS, HX OF 11/17/2007   PCP:  Garlan Fillers, MD Pharmacy:   [facility]) [place], [place] - [address] [place] [address] [place] [address] [place] [address] Phone: [phone] Fax: [phone]

## 2023-04-24 NOTE — Consult Note (Addendum)
PT arrived with patient in bed and wife at bedside.  Patient was able to verbally communicate within functional limits to continue.  Mod assist required for bed mobility to sit on the side of the bed.  Sticky socks were placed on the patient's feet and gait belt around the waist.  He was able to independently move both of his upper extremities.  Posture presents with significant thoracic kyphosis and forward head.  Upon standing patient is able to demonstrate full hip extension on the left side but notable contracture and posterior right leg.  When asked to lift his head, the patient lost his balance backwards and did the same when asked to place the right foot flat on the floor. He was recently evaluated for outpatient physical therapy.  Patient and wife report being very active and recent history but falls have become significantly increased. At this time I recommend placement into inpatient rehab for extensive PT and OT with a goal of ultimately returning to outpatient rehab and at home with his wife.  He is not safe to return home with his wife as he requires contact-guard to min assist at all times when standing.  See flowsheet for further details.   Taelyn Nemes C. Gevin Perea PT, DPT 04/24/23 5:49 PM

## 2023-04-24 NOTE — ED Notes (Signed)
Meal given

## 2023-04-24 NOTE — Social Work (Signed)
CSW has reviewed chart at this time patient will need to transfer to one of the sister hospitals  [facility] or [facility] for PT/OT evaluation. MD and nurse made aware. TOC will continue to follow.

## 2023-04-25 DIAGNOSIS — S5001XA Contusion of right elbow, initial encounter: Secondary | ICD-10-CM | POA: Diagnosis not present

## 2023-04-25 NOTE — ED Notes (Signed)
[name] is sleeping but easily awakened currently denies pain or discomfort tylenol was effective

## 2023-04-25 NOTE — Progress Notes (Signed)
Inpatient Rehab Admissions Coordinator :  Per [facility] SW recommendations patient was screened for CIR candidacy by Ottie Glazier RN MSN. Patient does not appear to demonstrate the medical neccesity for a Hospital Rehabilitation /CIR admit.Listed as TOC border status. I will not place a Rehab Consult. Recommend other Rehab Venues to be pursued. Please contact me with any questions. Noted outpatient therapy evals with PT and OT on 8/28.  Ottie Glazier RN MSN Admissions Coordinator [phone]

## 2023-04-25 NOTE — Discharge Planning (Signed)
Oletta Cohn, RN, BSN, [place] [phone] Pt qualifies for DME (Durable Medical Equipment) wheelchair.  DME  ordered through [employer].  Mitch of [employer] notified to deliver DME to pt room prior to D/C home.

## 2023-04-25 NOTE — Progress Notes (Signed)
    Durable Medical Equipment  (From admission, onward)           Start     Ordered   04/25/23 1126  For home use only DME wheelchair cushion (seat and back)  Once        04/25/23 1125              Pt currently has rolling walker but is now requiring wheelchair for short and long distance mobility.

## 2023-04-25 NOTE — Progress Notes (Signed)
CSW spoke with patients wife Tula Nakayama [phone]. CSW told patients wife that he didn't meet qualifications for CIR. Patients wife stated that PT notified her earlier. CSW asked patients wife if she would like for CSW to start the process for SNF placement. Patients wife stated no and she feels he will go backwards in a SNF and that they will not be able to provide the intensive therapy she needs. Patients wife stated patient will start outpatient PT with neurological services in two weeks. Patients wife would like to take patient home.

## 2023-04-25 NOTE — Discharge Planning (Signed)
    Durable Medical Equipment  (From admission, onward)           Start     Ordered   04/25/23 1126  For home use only DME wheelchair cushion (seat and back)  Once        04/25/23 1125

## 2023-04-25 NOTE — ED Notes (Signed)
Currently waiting on wheelchair delivery for discharge. Family updated on delay.

## 2023-04-25 NOTE — ED Notes (Addendum)
Pt ambulated the restroom w/assistance of myself and wife. Pt used a walker as well. He ambulated back to the bed and requested to sit on the side of same. Call bell within reach and wife at bedside. Pt c/o mild pain to his L big toe. Able to move it.  Pt is patiently waiting on breakfast to arrive.

## 2023-04-25 NOTE — Discharge Instructions (Signed)
Follow up with your family md next week for recheck °

## 2023-04-25 NOTE — ED Provider Notes (Signed)
Emergency Medicine Observation Re-evaluation Note  [name] is a 87 y.o. male, seen on rounds today.  Pt initially presented to the ED for complaints of Fall Currently, the patient is pending CIR, NH or home rehab.  Physical Exam  BP (!) 130/58 (BP Location: Left Arm)   Pulse 77   Temp 98.2 F (36.8 C) (Oral)   Resp 17   SpO2 99%  Physical Exam Alert and in no acute distress  ED Course / MDM  EKG:EKG Interpretation Date/Time:  Thursday April 24 2023 11:32:42 EDT Ventricular Rate:  93 PR Interval:  201 QRS Duration:  75 QT Interval:  337 QTC Calculation: 420 R Axis:   28  Text Interpretation: Sinus rhythm Confirmed by Vonita Kozikowski [phone]) on 04/24/2023 11:37:58 AM  I have reviewed the labs performed to date as well as medications administered while in observation.  Recent changes in the last 24 hours include none.  Plan  Current plan is for weaning to significant plan for rehab.    Bethann Berkshire, MD 04/25/23 [phone]

## 2023-04-25 NOTE — ED Notes (Signed)
Physical Therapy at bedside.

## 2023-04-25 NOTE — Evaluation (Signed)
Physical Therapy Evaluation Patient Details Name: [name] MRN: [id number] DOB: September 26, 1935 Today's Date: 04/25/2023  History of Present Illness  87 y.o. male.     87 year old male with a history of Parkinson's who presents emergency department after a fall.  Patient does have a history of Parkinson's and walks with a walker.  Has been having frequent falls recently.  Presented with fall 04/24/23 where he was walking out of the restroom and then says that he started backpedaling due to his Parkinson's (pt did not have walker with him in bathroom).  Larey Seat and struck his head on the bathtub. Imaging negative for acute fractures, showed Mild inferior endplate compression fracture of T3 has a chronic appearance, no intracranial injury. Clinical Impression  Pt admitted with above diagnosis. Pt admitted with a fall. Wife reports 6-7 falls in past 2 months. Pt typically uses a RW when ambulating but didn't have his walker with him in the bathroom where he fell just prior to presenting to [facility] ED. Wife stated RW doesn't fit through narrow doorway of bathroom. I recommended keeping an extra RW in the bathroom, or turning sideways to get RW through doorway. But strongly encouraged pt have RW and hands on assist any time he is up walking. Also recommended WC for longer distances. As pt is not a candidate for CIR, and pt/wife decline ST-SNF, resumption of outpatient PT is recommended.  I recommended a transfer tub bench for the bathroom to minimize fall risk with shower transfers (pt to purchase privately). Pt/spouse stated they feel they can manage at home.  Pt currently with functional limitations due to the deficits listed below (see PT Problem List). Pt will benefit from acute skilled PT to increase their independence and safety with mobility to allow discharge.          If plan is discharge home, recommend the following: A little help with walking and/or transfers;A little help with  bathing/dressing/bathroom;Direct supervision/assist for medications management;Assist for transportation;Help with stairs or ramp for entrance  Can travel by private vehicle       Equipment Recommendations Wheelchair (measurements PT);Wheelchair cushion (measurements PT);Other (comment) (family to privately purchase transfer tub bench and extra RW for bathroom) Recommendations for Other Services      Functional Status Assessment Patient has had a recent decline in their functional status and demonstrates the ability to make significant improvements in function in a reasonable and predictable amount of time.    Precautions / Restrictions Precautions Precautions: Fall Precaution Comments: ~6-7 falls in past 2 months Restrictions Weight Bearing Restrictions: No     Mobility  Bed Mobility Overal bed mobility: Modified Independent             General bed mobility comments: HOB up, used rail    Transfers Overall transfer level: Needs assistance Equipment used: Rolling walker (2 wheels) Transfers: Sit to/from Stand Sit to Stand: Contact guard assist           General transfer comment: VCs hand placement    Ambulation/Gait Ambulation/Gait assistance: Min assist, Contact guard assist Gait Distance (Feet): 60 Feet Assistive device: Rolling walker (2 wheels) Gait Pattern/deviations: Step-through pattern, Decreased step length - right, Narrow base of support, Trunk flexed Gait velocity: decr     General Gait Details: 60' x 2 with RW, CGA to min A for intermittent posterior lean, festination noted as well as stops and starts.  Stairs            Psychologist, prison and probation services  Tilt Bed    Modified Rankin (Stroke Patients Only)      Balance Overall balance assessment: Needs assistance, History of Falls Sitting-balance support: Feet supported, No upper extremity supported Sitting balance-Leahy Scale: Fair Sitting balance - Comments: posterior lean when pt had  only 1 LE supported on floor and no UE support   Standing balance support: During functional activity, Bilateral upper extremity supported, Reliant on assistive device for balance Standing balance-Leahy Scale: Poor Standing balance comment: 6-7 falls in 2 months, all were backwards per wife                            Pertinent Vitals/Pain Pain Assessment Pain Assessment: 0-10 Pain Score: 5  Pain Location: L great toe (no increased pain with walking) Pain Descriptors / Indicators: Sore Pain Intervention(s): Limited activity within patient's tolerance, Monitored during session, Premedicated before session   Home Living Family/patient expects to be discharged to:: Private residence Living Arrangements: Spouse/significant other Available Help at Discharge: Family;Available 24 hours/day Type of Home: House Home Access: Ramped entrance       Home Layout: One level Home Equipment: BSC/3in1;Rollator (4 wheels);Rolling Walker (2 wheels);Shower seat Additional Comments: 3 in 1 placed over toilet; bathroom doorway too narrow for RW to fit through (I recommended extra RW be left in bathroom, or pt can enter sideways). He did not have his RW with him when he fell just prior to this admission.   Prior Function Prior Level of Function : History of Falls (last six months)             Mobility Comments: using RW, ~6-7 falls in past 2 months      Extremity/Trunk Assessment  Upper Extremity Assessment Upper Extremity Assessment: Overall WFL for tasks assessed    Lower Extremity Assessment Lower Extremity Assessment: Generalized weakness;RLE deficits/detail;LLE deficits/detail RLE Deficits / Details: R foot injured in tractor accident at age 87, noted some foot drop with walking which pt reports is due to this injury. R ankle DF +3/5, DF AROM to 0*, knee ext 4/5 RLE Sensation: decreased light touch LLE Deficits / Details: knee ext AROM -15*, knee ext 4/5 LLE Sensation:  decreased light touch    Cervical / Trunk Assessment Cervical / Trunk Assessment: Kyphotic Communication  Communication Communication: No apparent difficulties Cognition Arousal: Alert Behavior During Therapy: WFL for tasks assessed/performed Overall Cognitive Status: Within Functional Limits for tasks assessed                                         General Comments     Exercises    Assessment/Plan   PT Assessment Patient needs continued PT services PT Problem List Decreased strength;Decreased range of motion;Decreased activity tolerance;Decreased balance;Decreased mobility;Pain     PT Treatment Interventions DME instruction;Gait training;Functional mobility training;Therapeutic activities;Therapeutic exercise;Balance training;Patient/family education   PT Goals (Current goals can be found in the Care Plan section)  Acute Rehab PT Goals Patient Stated Goal: return to walking with hiking poles PT Goal Formulation: With patient/family Time For Goal Achievement: 05/09/23 Potential to Achieve Goals: Good   Frequency Min 1X/week    Co-evaluation              AM-PAC PT "6 Clicks" Mobility  Outcome Measure Help needed turning from your back to your side while in a flat bed without using bedrails?: None Help  needed moving from lying on your back to sitting on the side of a flat bed without using bedrails?: A Little Help needed moving to and from a bed to a chair (including a wheelchair)?: A Little Help needed standing up from a chair using your arms (e.g., wheelchair or bedside chair)?: A Little Help needed to walk in hospital room?: A Little Help needed climbing 3-5 steps with a railing? : A Lot 6 Click Score: 18   End of Session Equipment Utilized During Treatment: Gait belt Activity Tolerance: Patient tolerated treatment well Patient left: in chair;with call bell/phone within reach;with family/visitor present Nurse Communication: Mobility  status PT Visit Diagnosis: Unsteadiness on feet (R26.81);Repeated falls (R29.6)   Time: 9604-5409 PT Time Calculation (min) (ACUTE ONLY): 69 min   Charges:   PT Evaluation $PT Eval Moderate Complexity: 1 Mod PT Treatments $Gait Training: 23-37 mins $Therapeutic Activity: 23-37 mins PT General Charges $$ ACUTE PT VISIT: 1 Visit       Tamala Ser PT 04/25/2023  Acute Rehabilitation Services  Office [phone]

## 2023-04-29 ENCOUNTER — Encounter: Payer: Self-pay | Admitting: Physical Therapy

## 2023-04-29 ENCOUNTER — Ambulatory Visit: Payer: Medicare Other | Attending: Neurology | Admitting: Physical Therapy

## 2023-04-29 DIAGNOSIS — R2681 Unsteadiness on feet: Secondary | ICD-10-CM | POA: Insufficient documentation

## 2023-04-29 DIAGNOSIS — M6281 Muscle weakness (generalized): Secondary | ICD-10-CM | POA: Diagnosis not present

## 2023-04-29 DIAGNOSIS — R2689 Other abnormalities of gait and mobility: Secondary | ICD-10-CM | POA: Insufficient documentation

## 2023-04-29 DIAGNOSIS — R293 Abnormal posture: Secondary | ICD-10-CM | POA: Diagnosis not present

## 2023-04-29 DIAGNOSIS — R29818 Other symptoms and signs involving the nervous system: Secondary | ICD-10-CM | POA: Diagnosis not present

## 2023-04-29 DIAGNOSIS — R262 Difficulty in walking, not elsewhere classified: Secondary | ICD-10-CM | POA: Diagnosis not present

## 2023-04-29 DIAGNOSIS — G20A2 Parkinson's disease without dyskinesia, with fluctuations: Secondary | ICD-10-CM | POA: Diagnosis not present

## 2023-04-29 NOTE — Therapy (Signed)
OUTPATIENT PHYSICAL THERAPY NEURO RE-EVALUATION/RECERT   Patient Name: [name] MRN: [id number] DOB:1936-07-27, 87 y.o., male Today's Date: 04/29/2023   PCP: Garlan Fillers REFERRING PROVIDER: Huston Foley, MD  END OF SESSION:  PT End of Session - 04/29/23 1350     Visit Number 2    Number of Visits 17   recert completed 04/29/2023   Date for PT Re-Evaluation 06/20/23    Authorization Type Medicare/[employer]-NEEDS KX due to previous PT and speech this year    Progress Note Due on Visit 10    PT Start Time 1357    PT Stop Time 1455    PT Time Calculation (min) 58 min    Equipment Utilized During Treatment Gait belt    Activity Tolerance Patient tolerated treatment well    Behavior During Therapy WFL for tasks assessed/performed              Past Medical History:  Diagnosis Date   Ankylosing spondylitis (HCC) dx'd ~ 1974   Arthritis    "back" (08/10/2015)   BENIGN PROSTATIC HYPERTROPHY, WITH OBSTRUCTION 01/15/2010   Bleeding duodenal ulcer    Bleeding esophageal ulcer    Bleeding stomach ulcer    GERD 02/22/2008   History of blood transfusion 1988   "lost ~ 1/2 of my blood volume from multiple bleeding ulcers"   History of hiatal hernia    HYPERGLYCEMIA 11/18/2007   HYPERLIPIDEMIA 02/22/2008   HYPERTENSION, UNSPECIFIED 11/10/2009   Kidney stones    Osteoarthritis    c-spine   PEPTIC ULCER DISEASE 11/17/2007   Situational depression    "son died in MVA June 25, 2015"   TIA (transient ischemic attack)    "not that I know of in the past; they are trying to determine if I've had one today (08/10/2015)"   Past Surgical History:  Procedure Laterality Date   CATARACT EXTRACTION W/ INTRAOCULAR LENS  IMPLANT, BILATERAL Bilateral 2013   Patient Active Problem List   Diagnosis Date Noted   Ankylosing spondylitis of cervicothoracic region (HCC) 10/16/2016   TIA (transient ischemic attack) 08/10/2015   Carotid stenosis 08/04/2014   Hyperlipidemia 02/21/2012   BENIGN PROSTATIC  HYPERTROPHY, WITH OBSTRUCTION 01/15/2010   Essential hypertension 11/10/2009   GERD 02/22/2008   NEPHROLITHIASIS, HX OF 11/17/2007    ONSET DATE: "several years"  REFERRING DIAG: G20.A2 (ICD-10-CM) - Parkinson's disease with fluctuating manifestations, unspecified whether dyskinesia present R26.81 (ICD-10-CM) - Unsteadiness on feet R41.841 (ICD-10-CM) - Cognitive communication deficit R27.8 (ICD-10-CM) - Other lack of coordination R26.2 (ICD-10-CM) - Difficulty in walking, not elsewhere classified  THERAPY DIAG:  Unsteadiness on feet  Muscle weakness (generalized)  Other abnormalities of gait and mobility  Rationale for Evaluation and Treatment: Rehabilitation  SUBJECTIVE:  SUBJECTIVE STATEMENT: Had a fall just before we left home today.  Had on slip-on shoes and foot caught while turning.  Also had ED visit on Thursday due to fall.  That fall was in the bathroom, trying to turn and fell backwards and hit head on bathtub.  More difficulty sit to stand, due to weakness.  Pt accompanied by: significant other  PERTINENT HISTORY: PD, complex medical history of peptic ulcer disease with history of upper GI bleed, TIA, depression, degenerative disc disease in the neck, osteoarthritis, kidney stones, hyperlipidemia, hypertension, history of hiatal hernia, BPH, arthritis, and ankylosing spondylitis, and parkinsonism   PAIN:  Are you having pain? Yes: NPRS scale: varies, 4-8/10 Pain location: low back Pain description: sore Aggravating factors: recent falls Relieving factors: rest  PRECAUTIONS: Fall  RED FLAGS: None   WEIGHT BEARING RESTRICTIONS: No  FALLS: Has patient fallen in last 6 months? Yes. Number of falls unknown, multiple  LIVING ENVIRONMENT: Lives with: lives with their spouse Lives  in: House/apartment Stairs: Ramp to enter, stairs to loft and basement, ground floor set-up Has following equipment at home: Dan Humphreys - 2 wheeled  PLOF: Needs assistance with ADLs, Needs assistance with homemaking, and Needs assistance with gait, supervision for ambulation at household distances  PATIENT GOALS: improve balance, walking, reduce falls  OBJECTIVE:    TODAY'S TREATMENT: 04/29/2023 Activity Comments  FTSTS:  49.34 sec hands on knees Min/mod assist and retropulsion  TUG:  70.28 sec RW and doesn't complete full turn  Gait velocity:  38.32 in 28 ft (0.73 ft/sec)      Short distance gait, 20 ft x 2 reps with RW Cues for large amplitude to increase step length  Gait 15 ft x 2, gait 40 ft with RW Cues to start with wide BOS and for increased step length, especially LLE  Sit to stand, repeated, blocked practice from mat, from chair, multiple reps CUES: 1-scoot forward 2-feet wide and staggered (R foot ahead of L) 3-Nose over toes and push up to stand TO SIT: 1-March/stomp to back up all the way 2-STOP if needed and step back fully to seat 3-Let go of walker and reach back to squat to sit         PATIENT EDUCATION: Education details: Sit to stand technique (see pt instructions with *wide staggered stance), large steps (equal/even steps with gait); discussed w/c that was ordered and delivered (too large for home) and wife plans to contact company for correct size chair to be delivered.  Provided pt with information on Aware in care kit from [facility] for communication if pt is hospitalized for ON TIME, EVERY TIME PD medication Person educated: Patient and Spouse Education method: Explanation, Demonstration, Tactile cues, Verbal cues, and Handouts Education comprehension: verbalized understanding, returned demonstration, verbal cues required, tactile cues required, and needs further education  ------------------------------------------------- Objective measures below  taken at initial evaluation:  DIAGNOSTIC FINDINGS: n/a for episode  COGNITION: Overall cognitive status: History of cognitive impairments - at baseline   SENSATION: Neuropathy in bilat feet  COORDINATION: Difficulty with rapid alternating movements    MUSCLE TONE: NT   POSTURE: rounded shoulders, forward head, flexed trunk , and head down , kyphosis  LOWER EXTREMITY ROM:     Active  Right Eval Left Eval  Hip flexion    Hip extension    Hip abduction    Hip adduction    Hip internal rotation    Hip external rotation    Knee flexion 120 120  Knee extension 0 0  Ankle dorsiflexion 5 12  Ankle plantarflexion    Ankle inversion    Ankle eversion     (Blank rows = not tested)  LOWER EXTREMITY MMT:    BLE strength grossly 3+/5  BED MOBILITY:  Mod A  TRANSFERS: Assistive device utilized: Environmental consultant - 2 wheeled  Sit to stand: CGA and Min A Stand to sit: SBA Chair to chair: SBA and CGA Floor: Mod A and Max A  RAMP:  Level of Assistance: CGA Assistive device utilized: Environmental consultant - 2 wheeled Ramp Comments:   CURB:  Level of Assistance: CGA Assistive device utilized: Environmental consultant - 2 wheeled Curb Comments:   STAIRS: NT  GAIT: Gait pattern: step to pattern, decreased stride length, and poor foot clearance- Right Distance walked: 100 Assistive device utilized: Walker - 2 wheeled Level of assistance: SBA and CGA Comments: level surfaces  FUNCTIONAL TESTS:  5 times sit to stand: 45 sec w/ CGA Timed up and go (TUG): 1 min 27 sec  M-CTSIB  Condition 1: Firm Surface, EO 10 Sec, Severe and retro-LOB  Sway  Condition 2: Firm Surface, EC 3 Sec, Severe and retro-LOB  Sway  Condition 3: Foam Surface, EO  Sec,  Sway  Condition 4: Foam Surface, EC  Sec,  Sway      TODAY'S TREATMENT:                                                                                                                              DATE: 04/23/23 Gait training: trials with visual cue on RW for step  length. Training in U-step for stride length    PATIENT EDUCATION: Education details: assessment details, gait training in use of visual cues/reference Person educated: Patient and Spouse Education method: Explanation Education comprehension: verbalized understanding  HOME EXERCISE PROGRAM: TBD  GOALS: Goals reviewed with patient? Yes  SHORT TERM GOALS: UPDATED Target date: 05/15/2023>05/23/2023    Patient will be independent in HEP to improve functional outcomes Baseline: Goal status: IN PROGRESS  2.  Teach-back strategies to reduce freezing of gait during turns Baseline: 16 steps to complete 180 deg turn Goal status: IN PROGRESS  3.  Demo improved safety and independence with sit to stand completing transfers with set-up assist Baseline: CGA-min A Goal status: IN PROGRESS    LONG TERM GOALS: UPDATED Target date: 06/05/2023>06/20/2023    Demo reduced risk for falls per time 35 sec TUG test Baseline: 1 min 27 sec Goal status: IN PROGRESS  2.  Demo improved BLE strength and balance per time of 25 sec 5xSTS test to increase independence with mobility Baseline: 45 sec Goal status: IN PROGRESS  3.  Demo improved safety with ambulation on level surfaces w/ supervision x 150 ft Baseline: SBA-CGA Goal status: IN PROGRESS    ASSESSMENT:  CLINICAL IMPRESSION: Pt was seen today for PT; since previous PT visit (eval in [facility]), pt has been to ED due to  a fall where he hit his head.  He also reports another fall today due to extreme backwards posture (per wife).  During pt's ED visit, they did imaging, which was negative for head injury.  He returns today, with objective measures (FTSTS and TUG scores) very similar to eval.  He does have one episode upon standing of strong retropulsion, with pt sitting at mat, despite PT's attempts and assist to right himself.  Focused skilled PT session today on strategies, cueing for consistent repetition of safe sit to stand  transfers.  He does best with foot placement in staggered wide position, with RLE anterior to LLE, as even when he stands with wide BOS with feet beside each other, he has strong retropulsion.  He responds well to repetition of transfer practice in session and wife present for instruction.  He will continue to benefit from skilled PT to address safety with transfers, initiation of gait, and turns, in addition to strengthening and balance work for overall improved functional mobility and decreased fall risk.    OBJECTIVE IMPAIRMENTS: Abnormal gait, decreased activity tolerance, decreased balance, decreased coordination, decreased mobility, difficulty walking, decreased ROM, decreased strength, improper body mechanics, and postural dysfunction.   ACTIVITY LIMITATIONS: carrying, lifting, standing, stairs, transfers, bed mobility, and locomotion level  PARTICIPATION LIMITATIONS: meal prep, cleaning, laundry, interpersonal relationship, community activity, and exercise routine  PERSONAL FACTORS: Age, Time since onset of injury/illness/exacerbation, and 3+ comorbidities: PMH  are also affecting patient's functional outcome.   REHAB POTENTIAL: Good  CLINICAL DECISION MAKING: Evolving/moderate complexity  EVALUATION COMPLEXITY: Moderate  PLAN:  PT FREQUENCY: 1-2x/week  PT DURATION: 8 weeks  PLANNED INTERVENTIONS: Therapeutic exercises, Therapeutic activity, Neuromuscular re-education, Balance training, Gait training, Patient/Family education, Self Care, and Joint mobilization  PLAN FOR NEXT SESSION: Work on review of sit<>stand transfers (focus on wide/staggered stance to prevent retropulsion upon standing); squats, partial sit to stand to focus on increased forward weightshift.  Consider stagger stance forward/back rocking at counter or RW (watch, as pt's response time is slowed to new/novel tasks).  carryover of gait strategies for stride?, U-step trials?, HEP for LE strength (sit-stand at  sink)   Lonia Blood, PT 04/29/23 3:03 PM Phone: [phone] Fax: [phone]  [facility] [address], [address] [place], [place] [address] Phone # [phone] Fax # [phone]

## 2023-04-29 NOTE — Patient Instructions (Signed)
Sit to Stand Transfers:  Scoot out to the edge of the chair Place your feet flat on the floor, shoulder width apart.  Make sure your feet are tucked just under your knees. Lean forward (nose over toes) with momentum, and stand up tall with your best posture.  If you need to use your arms, use them as a quick boost up to stand. If you are in a low or soft chair, you can lean back and then forward up to stand, in order to get more momentum. Once you are standing, make sure you are looking ahead and standing tall.  To sit down:  Back up until you feel the chair behind your legs. Bend at your hips, reaching  back for you chair, if needed, then slowly squat to sit down on your chair.

## 2023-05-01 ENCOUNTER — Ambulatory Visit: Payer: Medicare Other | Admitting: Family Medicine

## 2023-05-01 NOTE — Progress Notes (Signed)
Cardiology Clinic Note   Date: 05/02/2023 ID: LAYTH URBIETA, DOB 30-Mar-1936, MRN [id number]  Primary Cardiologist:  Peter Swaziland, MD  Patient Profile    [name] is a 87 y.o. male who presents to the clinic today for hospital follow up.     Past medical history significant for: Cardiac murmur. Echo 03/05/2023: EF 55 to 60%.  No RWMA.  Indeterminate diastolic parameters.  Normal RV function.  Moderate RAE.  No significant valvular abnormalities. Hypertension. Hyperlipidemia. Carotid stenosis. Carotid duplex 03/05/2023: Extracranial vessels were near normal with only minimal wall thickening or plaque bilaterally. TIA. GERD. Parkinson disease. Ankylosing spondylitis.     History of Present Illness    [name] is a longtime patient of cardiology.  He was initially followed by Dr. Shirlee Latch.  He transitioned his care to Dr. Swaziland on 10/21/2016.  Patient with past history of chest pain for which he underwent this testing in February 2011.  Exercise stopped after 4 minutes secondary to fatigue and bilateral shoulder pain.  BP increased to 206/60.  1 mm ST depression in inferior lateral leads.  Nuclear images without ischemia or infarction.  Unable to do coronary CTA so stress echo was performed showing hypertensive BP response and no evidence for ischemia or infarction.  At the time of his visit with Dr. Swaziland he had no complaints.  He continues to be followed by Dr. Swaziland for the above outlined history.  Patient was last seen in the office by Reather Littler, NP on 01/30/2023 for routine follow-up.  He reported 3 episodes of left-sided chest pressure with exertion lasting a few minutes and relieved with rest.  He also reported occasional lightheadedness without near syncope or syncope.  Carotid ultrasound was repeated and showed near normal extracranial vessels with only minimal wall thickening or plaque bilaterally.  Ischemic evaluation for chest pressure was discussed and decision was made to  not pursue at that time.  Cardiac murmur was heard at the time of his visit which appeared to be new.  Echo did not show any significant valvular abnormalities.  Patient presented to the ED on 04/24/2023 for mechanical fall.  Diagnostic testing negative for acute injuries.  Patient was discharged with wheelchair and home PT.  Today, patient is accompanied by his wife. Patient denies shortness of breath or dyspnea on exertion. No chest pain, pressure, or tightness. Denies lower extremity edema, orthopnea, or PND. No palpitations.  He fell about an hour ago coming out of [employer]. He was using his walker and trying to get down the curb when he lost his balance and fell. He did not hit his head because his wife caught him before it hit the ground. They did receive a wheelchair from the ED but it is too big for him to use in his home and too heavy for his wife to bring with them to appointments. He is going to start neuro rehab. They are trying to get a palliative home evaluation but having a hard time getting assistance from PCP.     ROS: All other systems reviewed and are otherwise negative except as noted in History of Present Illness.  Studies Reviewed     EKG is not ordered today.      Physical Exam    VS:  BP (!) 140/72   Pulse 97   Ht 5\' 7"  (1.702 m)   Wt 153 lb (69.4 kg)   SpO2 99%   BMI 23.96 kg/m  , BMI Body mass  index is 23.96 kg/m.  GEN: Well nourished, well developed, in no acute distress. Neck: No JVD or carotid bruits. Cardiac:  RRR. 2/6 systolic murmur. No rubs or gallops.   Respiratory:  Respirations regular and unlabored. Clear to auscultation without rales, wheezing or rhonchi. GI: Soft, nontender, nondistended. Extremities: Radials/DP/PT 2+ and equal bilaterally. No clubbing or cyanosis. No edema.  Skin: Warm and dry, no rash. Neuro: Strength intact.  Assessment & Plan    Cardiac murmur.  2/6 systolic murmur heard on exam 08/31/1094.  Echo July 2024 showed no  significant valvular abnormalities. Carotid artery stenosis.  Carotid duplex July 2024 showed near normal extracranial vessels with only minimal wall thickening or plaque bilaterally.  Patient denies lightheadedness or dizziness.  Continue aspirin, atorvastatin. Hypertension. BP today 140/72. Patient denies headaches, dizziness or vision changes. Continue Hyzaar. Given patient's history of frequent falls discussed permissive hypertension with patient and wife.  Frequent falls.  Most recent ED visit 04/24/2023 for mechanical fall.  No acute injuries.  Patient discharged with wheelchair and home PT. Last fall about 1 hour ago coming out of [employer]. He did not hit his head as patient's wife caught him. No other injuries sustained. The wheelchair they got from the ED is too big for him to use at home and too heavy for patient's wife to get in and out of the car to have at appointments. They are working on getting a palliative home assessment. Wife is also working to get the wheelchair replaced.   Disposition: Return in 6 months or sooner as needed.          Signed, Etta Grandchild. Iylah Dworkin, DNP, NP-C

## 2023-05-01 NOTE — Therapy (Signed)
OUTPATIENT PHYSICAL THERAPY NEURO TEATMENT   Patient Name: [name] MRN: [id number] DOB:22-Sep-1935, 87 y.o., male Today's Date: 05/02/2023   PCP: Garlan Fillers REFERRING PROVIDER: Huston Foley, MD  END OF SESSION:  PT End of Session - 05/02/23 1149     Visit Number 3    Number of Visits 17   recert completed 04/29/2023   Date for PT Re-Evaluation 06/20/23    Authorization Type Medicare/[employer]-NEEDS KX due to previous PT and speech this year    Progress Note Due on Visit 10    PT Start Time 1105    PT Stop Time 1149    PT Time Calculation (min) 44 min    Equipment Utilized During Treatment Gait belt    Activity Tolerance Patient tolerated treatment well    Behavior During Therapy WFL for tasks assessed/performed               Past Medical History:  Diagnosis Date   Ankylosing spondylitis (HCC) dx'd ~ 1974   Arthritis    "back" (08/10/2015)   BENIGN PROSTATIC HYPERTROPHY, WITH OBSTRUCTION 01/15/2010   Bleeding duodenal ulcer    Bleeding esophageal ulcer    Bleeding stomach ulcer    GERD 02/22/2008   History of blood transfusion 1988   "lost ~ 1/2 of my blood volume from multiple bleeding ulcers"   History of hiatal hernia    HYPERGLYCEMIA 11/18/2007   HYPERLIPIDEMIA 02/22/2008   HYPERTENSION, UNSPECIFIED 11/10/2009   Kidney stones    Osteoarthritis    c-spine   PEPTIC ULCER DISEASE 11/17/2007   Situational depression    "son died in MVA Jul 07, 2015"   TIA (transient ischemic attack)    "not that I know of in the past; they are trying to determine if I've had one today (08/10/2015)"   Past Surgical History:  Procedure Laterality Date   CATARACT EXTRACTION W/ INTRAOCULAR LENS  IMPLANT, BILATERAL Bilateral 2013   Patient Active Problem List   Diagnosis Date Noted   Ankylosing spondylitis of cervicothoracic region (HCC) 10/16/2016   TIA (transient ischemic attack) 08/10/2015   Carotid stenosis 08/04/2014   Hyperlipidemia 02/21/2012   BENIGN PROSTATIC  HYPERTROPHY, WITH OBSTRUCTION 01/15/2010   Essential hypertension 11/10/2009   GERD 02/22/2008   NEPHROLITHIASIS, HX OF 11/17/2007    ONSET DATE: "several years"  REFERRING DIAG: G20.A2 (ICD-10-CM) - Parkinson's disease with fluctuating manifestations, unspecified whether dyskinesia present R26.81 (ICD-10-CM) - Unsteadiness on feet R41.841 (ICD-10-CM) - Cognitive communication deficit R27.8 (ICD-10-CM) - Other lack of coordination R26.2 (ICD-10-CM) - Difficulty in walking, not elsewhere classified  THERAPY DIAG:  Unsteadiness on feet  Muscle weakness (generalized)  Other abnormalities of gait and mobility  Parkinson's disease with fluctuating manifestations, unspecified whether dyskinesia present  Difficulty in walking, not elsewhere classified  Rationale for Evaluation and Treatment: Rehabilitation  SUBJECTIVE:  SUBJECTIVE STATEMENT: Last appointment was very helpful. Reports that his back is still sore from falling several days ago. Showing R thumb and dorsal hand which is also bruised.   Pt accompanied by: significant other  PERTINENT HISTORY: PD, complex medical history of peptic ulcer disease with history of upper GI bleed, TIA, depression, degenerative disc disease in the neck, osteoarthritis, kidney stones, hyperlipidemia, hypertension, history of hiatal hernia, BPH, arthritis, and ankylosing spondylitis, and parkinsonism   PAIN:  Are you having pain? Yes: NPRS scale: varies, 4-8/10 Pain location: low to mid back Pain description: sore Aggravating factors: recent falls Relieving factors: rest  PRECAUTIONS: Fall  RED FLAGS: None   WEIGHT BEARING RESTRICTIONS: No  FALLS: Has patient fallen in last 6 months? Yes. Number of falls unknown, multiple  LIVING ENVIRONMENT: Lives with:  lives with their spouse Lives in: House/apartment Stairs: Ramp to enter, stairs to loft and basement, ground floor set-up Has following equipment at home: Dan Humphreys - 2 wheeled  PLOF: Needs assistance with ADLs, Needs assistance with homemaking, and Needs assistance with gait, supervision for ambulation at household distances  PATIENT GOALS: improve balance, walking, reduce falls  OBJECTIVE:     TODAY'S TREATMENT: 05/02/23 Activity Comments  sitting anterior trunk lean to reach stool in front (priming for transfers)   it<>stand transfers (focus on wide/staggered stance to prevent retropulsion upon standing) x8 Cueing to tuck L foot further back and exaggerate nose over toes; CGA. Mat at 20.5 inch  mini squats 10x    staggered ant/pos wt shifts Cueing to separate feet, used visual cue of belly closer/further from the counter to encourage wt shift  1/4 turns at counter and chair Focus on 4 steps to complete turn, alternating feet, "marching"        HOME EXERCISE PROGRAM Last updated: 05/02/23 Access Code: [email or web address] URL: [email or web address] Date: 05/02/2023 Prepared by: [facility]  [facility] - [facility]  Program Notes perform with wife's supervision  Exercises - Mini Squat with Counter Support  - 1 x daily - 5 x weekly - 2 sets - 10 reps - Staggered Stance Forward Backward Weight Shift with Counter Support  - 1 x daily - 5 x weekly - 2 sets - 10 reps - Standing Quarter Turn with Counter Support  - 1 x daily - 5 x weekly - 2 sets - 5 reps   PATIENT EDUCATION: Education details: HEP update and caregiver edu  Person educated: Patient and Spouse Education method: Explanation, Demonstration, Tactile cues, Verbal cues, and Handouts Education comprehension: verbalized understanding and returned demonstration    ------------------------------------------------- Objective measures below taken at initial evaluation:  DIAGNOSTIC FINDINGS: n/a for  episode  COGNITION: Overall cognitive status: History of cognitive impairments - at baseline   SENSATION: Neuropathy in bilat feet  COORDINATION: Difficulty with rapid alternating movements    MUSCLE TONE: NT   POSTURE: rounded shoulders, forward head, flexed trunk , and head down , kyphosis  LOWER EXTREMITY ROM:     Active  Right Eval Left Eval  Hip flexion    Hip extension    Hip abduction    Hip adduction    Hip internal rotation    Hip external rotation    Knee flexion 120 120  Knee extension 0 0  Ankle dorsiflexion 5 12  Ankle plantarflexion    Ankle inversion    Ankle eversion     (Blank rows = not tested)  LOWER EXTREMITY MMT:    BLE strength grossly 3+/5  BED  MOBILITY:  Mod A  TRANSFERS: Assistive device utilized: Environmental consultant - 2 wheeled  Sit to stand: CGA and Min A Stand to sit: SBA Chair to chair: SBA and CGA Floor: Mod A and Max A  RAMP:  Level of Assistance: CGA Assistive device utilized: Environmental consultant - 2 wheeled Ramp Comments:   CURB:  Level of Assistance: CGA Assistive device utilized: Environmental consultant - 2 wheeled Curb Comments:   STAIRS: NT  GAIT: Gait pattern: step to pattern, decreased stride length, and poor foot clearance- Right Distance walked: 100 Assistive device utilized: Walker - 2 wheeled Level of assistance: SBA and CGA Comments: level surfaces  FUNCTIONAL TESTS:  5 times sit to stand: 45 sec w/ CGA Timed up and go (TUG): 1 min 27 sec  M-CTSIB  Condition 1: Firm Surface, EO 10 Sec, Severe and retro-LOB  Sway  Condition 2: Firm Surface, EC 3 Sec, Severe and retro-LOB  Sway  Condition 3: Foam Surface, EO  Sec,  Sway  Condition 4: Foam Surface, EC  Sec,  Sway      TODAY'S TREATMENT:                                                                                                                              DATE: 04/23/23 Gait training: trials with visual cue on RW for step length. Training in U-step for stride length    PATIENT  EDUCATION: Education details: assessment details, gait training in use of visual cues/reference Person educated: Patient and Spouse Education method: Explanation Education comprehension: verbalized understanding  HOME EXERCISE PROGRAM: TBD  GOALS: Goals reviewed with patient? Yes  SHORT TERM GOALS: UPDATED Target date: 05/15/2023>05/23/2023    Patient will be independent in HEP to improve functional outcomes Baseline: Goal status: IN PROGRESS  2.  Teach-back strategies to reduce freezing of gait during turns Baseline: 16 steps to complete 180 deg turn Goal status: IN PROGRESS  3.  Demo improved safety and independence with sit to stand completing transfers with set-up assist Baseline: CGA-min A Goal status: IN PROGRESS    LONG TERM GOALS: UPDATED Target date: 06/05/2023>06/20/2023    Demo reduced risk for falls per time 35 sec TUG test Baseline: 1 min 27 sec Goal status: IN PROGRESS  2.  Demo improved BLE strength and balance per time of 25 sec 5xSTS test to increase independence with mobility Baseline: 45 sec Goal status: IN PROGRESS  3.  Demo improved safety with ambulation on level surfaces w/ supervision x 150 ft Baseline: SBA-CGA Goal status: IN PROGRESS    ASSESSMENT:  CLINICAL IMPRESSION: Patient arrived to session with report of remaining back soreness since fall this week. Worked on priming for transfers to address retropulsion. Patient able to perform proper technique after repeated practice, however with limited carryover after short period. Safely performed standing exercises at counter for weight shifting and turning today. Heavy reliance on verbal cues. HEP was updated to be performed with spouse's assist. No complaints  upon leaving.   OBJECTIVE IMPAIRMENTS: Abnormal gait, decreased activity tolerance, decreased balance, decreased coordination, decreased mobility, difficulty walking, decreased ROM, decreased strength, improper body mechanics, and  postural dysfunction.   ACTIVITY LIMITATIONS: carrying, lifting, standing, stairs, transfers, bed mobility, and locomotion level  PARTICIPATION LIMITATIONS: meal prep, cleaning, laundry, interpersonal relationship, community activity, and exercise routine  PERSONAL FACTORS: Age, Time since onset of injury/illness/exacerbation, and 3+ comorbidities: PMH  are also affecting patient's functional outcome.   REHAB POTENTIAL: Good  CLINICAL DECISION MAKING: Evolving/moderate complexity  EVALUATION COMPLEXITY: Moderate  PLAN:  PT FREQUENCY: 1-2x/week  PT DURATION: 8 weeks  PLANNED INTERVENTIONS: Therapeutic exercises, Therapeutic activity, Neuromuscular re-education, Balance training, Gait training, Patient/Family education, Self Care, and Joint mobilization  PLAN FOR NEXT SESSION: review HEP; Work on review of sit<>stand transfers (focus on wide/staggered stance to prevent retropulsion upon standing); squats, partial sit to stand to focus on increased forward weightshift.  Consider stagger stance forward/back rocking at counter or RW (watch, as pt's response time is slowed to new/novel tasks).  carryover of gait strategies for stride?, U-step trials?, HEP for LE strength (sit-stand at sink)   Anette Guarneri, PT, DPT 05/02/23 11:54 AM  [facility] [address] [place], [address] [place], [place] [address] Phone # [phone] Fax # [phone]

## 2023-05-02 ENCOUNTER — Ambulatory Visit: Payer: Medicare Other | Admitting: Physical Therapy

## 2023-05-02 ENCOUNTER — Encounter: Payer: Self-pay | Admitting: Physical Therapy

## 2023-05-02 ENCOUNTER — Encounter: Payer: Self-pay | Admitting: Student

## 2023-05-02 ENCOUNTER — Ambulatory Visit: Payer: Medicare Other | Attending: Student | Admitting: Student

## 2023-05-02 VITALS — BP 140/72 | HR 97 | Ht 67.0 in | Wt 153.0 lb

## 2023-05-02 DIAGNOSIS — R011 Cardiac murmur, unspecified: Secondary | ICD-10-CM

## 2023-05-02 DIAGNOSIS — M6281 Muscle weakness (generalized): Secondary | ICD-10-CM

## 2023-05-02 DIAGNOSIS — R262 Difficulty in walking, not elsewhere classified: Secondary | ICD-10-CM

## 2023-05-02 DIAGNOSIS — R2689 Other abnormalities of gait and mobility: Secondary | ICD-10-CM

## 2023-05-02 DIAGNOSIS — I6523 Occlusion and stenosis of bilateral carotid arteries: Secondary | ICD-10-CM

## 2023-05-02 DIAGNOSIS — R2681 Unsteadiness on feet: Secondary | ICD-10-CM

## 2023-05-02 DIAGNOSIS — R296 Repeated falls: Secondary | ICD-10-CM

## 2023-05-02 DIAGNOSIS — R29818 Other symptoms and signs involving the nervous system: Secondary | ICD-10-CM | POA: Diagnosis not present

## 2023-05-02 DIAGNOSIS — G20A2 Parkinson's disease without dyskinesia, with fluctuations: Secondary | ICD-10-CM

## 2023-05-02 DIAGNOSIS — I10 Essential (primary) hypertension: Secondary | ICD-10-CM

## 2023-05-02 DIAGNOSIS — R293 Abnormal posture: Secondary | ICD-10-CM | POA: Diagnosis not present

## 2023-05-02 NOTE — Patient Instructions (Signed)
Medication Instructions:  No changes in medication *If you need a refill on your cardiac medications before your next appointment, please call your pharmacy*   Lab Work: No labs ordered If you have labs (blood work) drawn today and your tests are completely normal, you will receive your results only by: MyChart Message (if you have MyChart) OR A paper copy in the mail If you have any lab test that is abnormal or we need to change your treatment, we will call you to review the results.   Testing/Procedures: none   Follow-Up: At [facility], you and your health needs are our priority.  As part of our continuing mission to provide you with exceptional heart care, we have created designated Provider Care Teams.  These Care Teams include your primary Cardiologist (physician) and Advanced Practice Providers (APPs -  Physician Assistants and Nurse Practitioners) who all work together to provide you with the care you need, when you need it.  We recommend signing up for the patient portal called "MyChart".  Sign up information is provided on this After Visit Summary.  MyChart is used to connect with patients for Virtual Visits (Telemedicine).  Patients are able to view lab/test results, encounter notes, upcoming appointments, etc.  Non-urgent messages can be sent to your provider as well.   To learn more about what you can do with MyChart, go to [email or web address].    Your next appointment:   6 month(s)  Provider:   Peter Swaziland, MD      [facility] [phone]

## 2023-05-06 ENCOUNTER — Ambulatory Visit: Payer: Medicare Other | Admitting: Rehabilitative and Restorative Service Providers"

## 2023-05-06 DIAGNOSIS — B351 Tinea unguium: Secondary | ICD-10-CM | POA: Diagnosis not present

## 2023-05-06 DIAGNOSIS — M79676 Pain in unspecified toe(s): Secondary | ICD-10-CM | POA: Diagnosis not present

## 2023-05-08 ENCOUNTER — Ambulatory Visit: Payer: Medicare Other | Admitting: Physical Therapy

## 2023-05-08 DIAGNOSIS — M6281 Muscle weakness (generalized): Secondary | ICD-10-CM | POA: Diagnosis not present

## 2023-05-08 DIAGNOSIS — R2689 Other abnormalities of gait and mobility: Secondary | ICD-10-CM | POA: Diagnosis not present

## 2023-05-08 DIAGNOSIS — R29818 Other symptoms and signs involving the nervous system: Secondary | ICD-10-CM | POA: Diagnosis not present

## 2023-05-08 DIAGNOSIS — R2681 Unsteadiness on feet: Secondary | ICD-10-CM

## 2023-05-08 DIAGNOSIS — R293 Abnormal posture: Secondary | ICD-10-CM | POA: Diagnosis not present

## 2023-05-08 DIAGNOSIS — G20A2 Parkinson's disease without dyskinesia, with fluctuations: Secondary | ICD-10-CM | POA: Diagnosis not present

## 2023-05-08 NOTE — Therapy (Signed)
OUTPATIENT PHYSICAL THERAPY NEURO TEATMENT   Patient Name: [name] MRN: [id number] DOB:[birth year], 87 y.o., male Today's Date: 05/08/2023   PCP: Garlan Fillers REFERRING PROVIDER: Huston Foley, MD  END OF SESSION:  PT End of Session - 05/08/23 1458     Visit Number 4    Number of Visits 17   recert completed 04/29/2023   Date for PT Re-Evaluation 06/20/23    Authorization Type Medicare/[employer]-NEEDS KX due to previous PT and speech this year    Progress Note Due on Visit 10    PT Start Time 1449    PT Stop Time 1533    PT Time Calculation (min) 44 min    Equipment Utilized During Treatment Gait belt    Activity Tolerance Patient tolerated treatment well    Behavior During Therapy WFL for tasks assessed/performed                Past Medical History:  Diagnosis Date   Ankylosing spondylitis (HCC) dx'd ~ 1974   Arthritis    "back" (08/10/2015)   BENIGN PROSTATIC HYPERTROPHY, WITH OBSTRUCTION 01/15/2010   Bleeding duodenal ulcer    Bleeding esophageal ulcer    Bleeding stomach ulcer    GERD 02/22/2008   History of blood transfusion 1988   "lost ~ 1/2 of my blood volume from multiple bleeding ulcers"   History of hiatal hernia    HYPERGLYCEMIA 11/18/2007   HYPERLIPIDEMIA 02/22/2008   HYPERTENSION, UNSPECIFIED 11/10/2009   Kidney stones    Osteoarthritis    c-spine   PEPTIC ULCER DISEASE 11/17/2007   Situational depression    "son died in MVA 07-08-2015"   TIA (transient ischemic attack)    "not that I know of in the past; they are trying to determine if I've had one today (08/10/2015)"   Past Surgical History:  Procedure Laterality Date   CATARACT EXTRACTION W/ INTRAOCULAR LENS  IMPLANT, BILATERAL Bilateral 2013   Patient Active Problem List   Diagnosis Date Noted   Ankylosing spondylitis of cervicothoracic region (HCC) 10/16/2016   TIA (transient ischemic attack) 08/10/2015   Carotid stenosis 08/04/2014   Hyperlipidemia 02/21/2012   BENIGN PROSTATIC  HYPERTROPHY, WITH OBSTRUCTION 01/15/2010   Essential hypertension 11/10/2009   GERD 02/22/2008   NEPHROLITHIASIS, HX OF 11/17/2007    ONSET DATE: "several years"  REFERRING DIAG: G20.A2 (ICD-10-CM) - Parkinson's disease with fluctuating manifestations, unspecified whether dyskinesia present R26.81 (ICD-10-CM) - Unsteadiness on feet R41.841 (ICD-10-CM) - Cognitive communication deficit R27.8 (ICD-10-CM) - Other lack of coordination R26.2 (ICD-10-CM) - Difficulty in walking, not elsewhere classified  THERAPY DIAG:  Unsteadiness on feet  Muscle weakness (generalized)  Other abnormalities of gait and mobility  Abnormal posture  Rationale for Evaluation and Treatment: Rehabilitation  SUBJECTIVE:  SUBJECTIVE STATEMENT: Having some pain in low back and R elbow.  Had 3 falls in past week-all were backwards.  No new pain, just sore and stiff.  Pt accompanied by: significant other  PERTINENT HISTORY: PD, complex medical history of peptic ulcer disease with history of upper GI bleed, TIA, depression, degenerative disc disease in the neck, osteoarthritis, kidney stones, hyperlipidemia, hypertension, history of hiatal hernia, BPH, arthritis, and ankylosing spondylitis, and parkinsonism   PAIN:  Are you having pain? Yes: NPRS scale: varies, 4-8/10 Pain location: low to mid back Pain description: sore Aggravating factors: recent falls Relieving factors: rest  PRECAUTIONS: Fall  RED FLAGS: None   WEIGHT BEARING RESTRICTIONS: No  FALLS: Has patient fallen in last 6 months? Yes. Number of falls unknown, multiple  LIVING ENVIRONMENT: Lives with: lives with their spouse Lives in: House/apartment Stairs: Ramp to enter, stairs to loft and basement, ground floor set-up Has following equipment at home:  Dan Humphreys - 2 wheeled  PLOF: Needs assistance with ADLs, Needs assistance with homemaking, and Needs assistance with gait, supervision for ambulation at household distances  PATIENT GOALS: improve balance, walking, reduce falls  OBJECTIVE:   Discussed recent mechanisms of falls:  one fall trying to descend curb at [facility] and one fall in the bathroom (unsure of third fall).  PT discussed utilizing w/c for out of home mobility for now, to improve safety with longer distance mobility.  Wife reports (again, as she also reported at OPPT eval) that company that provided w/c from hospital recommendation provided w/c too large for patient and too large for doorways in home.  She has tried to contact x 1 for them to switch out the w/c to more appropriate size for patient, and they have not come out to home yet.  Wife is going to try to call them again and agrees to provide PT with the company and number for me to call as well, given pt's 3 falls since last therapy session.  TODAY'S TREATMENT: 05/08/2023 Activity Comments  Seated edge of mat: forward lean to upright posture 10 reps; forward/side lean over blue therapy ball, 5 reps each side Pushing blue therapy ball forward     Seated march 3 x 10 LAQ, 3 x 10 3#, cues for intensity/amplitude of motion  Sit to stand x 3 reps, with SPT transfer, using RW x 1 Min guard, cues for wide BOS, scooting to edge, forward lean to push up to stand  Gait with RW 50 ft, then 60 ft, min assist Cues to STOP with festination, take a deep breath and start again with BIG effort steps      Access Code: [email or web address] URL: [email or web address] Date: 05/08/2023 Prepared by: [facility] - Outpatient  Rehab - [facility]  Program Notes perform with wife's supervision  Exercises - Mini Squat with Counter Support  - 1 x daily - 5 x weekly - 2 sets - 10 reps - Staggered Stance Forward Backward Weight Shift with Counter Support  - 1 x daily - 5 x weekly - 2 sets  - 10 reps - Standing Quarter Turn with Counter Support  - 1 x daily - 5 x weekly - 2 sets - 5 reps - Seated Long Arc Quad with Ankle Weight  - 1 x daily - 5 x weekly - 3 sets - 10 reps - Seated Hip Flexion March with Ankle Weights  - 1 x daily - 5 x weekly - 3 sets - 10 reps - Seated Active Hip Flexion  -  1 x daily - 7 x weekly - 1 sets - 10 reps - 5-10 sec hold       PATIENT EDUCATION: Education details: Given pt's 3 falls since last visit-advised pt/wife to hold off on standing exercises for now and focus on safety with transfers, gait and updates to seated exercises for HEP Person educated: Patient and Spouse Education method: Explanation, Demonstration, Tactile cues, Verbal cues, and Handouts Education comprehension: verbalized understanding and returned demonstration    ------------------------------------------------- Objective measures below taken at initial evaluation:  DIAGNOSTIC FINDINGS: n/a for episode  COGNITION: Overall cognitive status: History of cognitive impairments - at baseline   SENSATION: Neuropathy in bilat feet  COORDINATION: Difficulty with rapid alternating movements    MUSCLE TONE: NT   POSTURE: rounded shoulders, forward head, flexed trunk , and head down , kyphosis  LOWER EXTREMITY ROM:     Active  Right Eval Left Eval  Hip flexion    Hip extension    Hip abduction    Hip adduction    Hip internal rotation    Hip external rotation    Knee flexion 120 120  Knee extension 0 0  Ankle dorsiflexion 5 12  Ankle plantarflexion    Ankle inversion    Ankle eversion     (Blank rows = not tested)  LOWER EXTREMITY MMT:    BLE strength grossly 3+/5  BED MOBILITY:  Mod A  TRANSFERS: Assistive device utilized: Environmental consultant - 2 wheeled  Sit to stand: CGA and Min A Stand to sit: SBA Chair to chair: SBA and CGA Floor: Mod A and Max A  RAMP:  Level of Assistance: CGA Assistive device utilized: Environmental consultant - 2 wheeled Ramp Comments:   CURB:   Level of Assistance: CGA Assistive device utilized: Environmental consultant - 2 wheeled Curb Comments:   STAIRS: NT  GAIT: Gait pattern: step to pattern, decreased stride length, and poor foot clearance- Right Distance walked: 100 Assistive device utilized: Walker - 2 wheeled Level of assistance: SBA and CGA Comments: level surfaces  FUNCTIONAL TESTS:  5 times sit to stand: 45 sec w/ CGA Timed up and go (TUG): 1 min 27 sec  M-CTSIB  Condition 1: Firm Surface, EO 10 Sec, Severe and retro-LOB  Sway  Condition 2: Firm Surface, EC 3 Sec, Severe and retro-LOB  Sway  Condition 3: Foam Surface, EO  Sec,  Sway  Condition 4: Foam Surface, EC  Sec,  Sway      TODAY'S TREATMENT:                                                                                                                              DATE: 04/23/23 Gait training: trials with visual cue on RW for step length. Training in U-step for stride length    PATIENT EDUCATION: Education details: assessment details, gait training in use of visual cues/reference Person educated: Patient and Spouse Education method: Explanation Education comprehension: verbalized understanding  HOME EXERCISE  PROGRAM: TBD  GOALS: Goals reviewed with patient? Yes  SHORT TERM GOALS: UPDATED Target date: 05/15/2023>05/23/2023    Patient will be independent in HEP to improve functional outcomes Baseline: Goal status: IN PROGRESS  2.  Teach-back strategies to reduce freezing of gait during turns Baseline: 16 steps to complete 180 deg turn Goal status: IN PROGRESS  3.  Demo improved safety and independence with sit to stand completing transfers with set-up assist Baseline: CGA-min A Goal status: IN PROGRESS    LONG TERM GOALS: UPDATED Target date: 06/05/2023>06/20/2023    Demo reduced risk for falls per time 35 sec TUG test Baseline: 1 min 27 sec Goal status: IN PROGRESS  2.  Demo improved BLE strength and balance per time of 25 sec 5xSTS test to  increase independence with mobility Baseline: 45 sec Goal status: IN PROGRESS  3.  Demo improved safety with ambulation on level surfaces w/ supervision x 150 ft Baseline: SBA-CGA Goal status: IN PROGRESS    ASSESSMENT:  CLINICAL IMPRESSION: Pt and wife arrive at session today, reporting pt having 3 falls since last visit.  All falls occurred backwards.  Discussed safety with out of the home mobility, using w/c, but pt did not get correct size delivered from hospital visit and company has not yet responded to wife's call to bring correct w/c size to home.  Practiced again safety with transfers, including importance of forward lean, wide BOS and slowed pace to stand to avoid retropulsion.  Pt able to perform in session today without strong retropulsion.  Given pt's recent and continued falls, discussed holding on standing exercises at this time and focusing on seated HEP as well as safety with transfers and short distance gait.  Wife present for education; pt has no c/o upon leaving. OBJECTIVE IMPAIRMENTS: Abnormal gait, decreased activity tolerance, decreased balance, decreased coordination, decreased mobility, difficulty walking, decreased ROM, decreased strength, improper body mechanics, and postural dysfunction.   ACTIVITY LIMITATIONS: carrying, lifting, standing, stairs, transfers, bed mobility, and locomotion level  PARTICIPATION LIMITATIONS: meal prep, cleaning, laundry, interpersonal relationship, community activity, and exercise routine  PERSONAL FACTORS: Age, Time since onset of injury/illness/exacerbation, and 3+ comorbidities: PMH  are also affecting patient's functional outcome.   REHAB POTENTIAL: Good  CLINICAL DECISION MAKING: Evolving/moderate complexity  EVALUATION COMPLEXITY: Moderate  PLAN:  PT FREQUENCY: 1-2x/week  PT DURATION: 8 weeks  PLANNED INTERVENTIONS: Therapeutic exercises, Therapeutic activity, Neuromuscular re-education, Balance training, Gait training,  Patient/Family education, Self Care, and Joint mobilization  PLAN FOR NEXT SESSION: review seated HEP and try again standing exercises given as HEP; Work on review of sit<>stand transfers (focus on wide/staggered stance to prevent retropulsion upon standing); squats, partial sit to stand to focus on increased forward weightshift.  Consider stagger stance forward/back rocking at counter or RW (watch, as pt's response time is slowed to new/novel tasks).  carryover of gait strategies for stride?, U-step trials?, HEP for LE strength (sit-stand at sink).  Ask if they have gotten w/c for home?   Lonia Blood, PT 05/08/23 5:25 PM Phone: [phone] Fax: [phone]  [facility] at [facility] [address] [place], [address] [place], [place] [address] Phone # [phone] Fax # [phone]

## 2023-05-09 ENCOUNTER — Telehealth: Payer: Self-pay | Admitting: Physical Therapy

## 2023-05-09 ENCOUNTER — Other Ambulatory Visit: Payer: Self-pay | Admitting: Neurology

## 2023-05-09 DIAGNOSIS — G20C Parkinsonism, unspecified: Secondary | ICD-10-CM

## 2023-05-09 DIAGNOSIS — Z789 Other specified health status: Secondary | ICD-10-CM

## 2023-05-09 DIAGNOSIS — K5909 Other constipation: Secondary | ICD-10-CM

## 2023-05-09 DIAGNOSIS — Z9181 History of falling: Secondary | ICD-10-CM

## 2023-05-09 DIAGNOSIS — R351 Nocturia: Secondary | ICD-10-CM

## 2023-05-09 DIAGNOSIS — N318 Other neuromuscular dysfunction of bladder: Secondary | ICD-10-CM

## 2023-05-09 NOTE — Telephone Encounter (Signed)
PT received phone number for [employer] [phone]) from wife, who gave PT permission to contact [employer] and ask about follow up for patient receiving w/c from them that is too large and unusable.  Sam at Call center says a new order for w/c is needed for standard, smaller size wheelchair (even though 16x20 w/c was deliverted to home from order from ED 04/24/2023 and pt has had at least 3 falls in past week).  Office is not open this morning until 9 am, and PT will contact local [employer] office at [phone] later this morning.  Lonia Blood, PT 05/09/23 8:40 AM Phone: [phone] Fax: [phone]

## 2023-05-09 NOTE — Telephone Encounter (Signed)
PT called local [facility]) [employer] number, [phone] and spoke to a customer service representative.  She states that [facility] location does not deal with wheelchairs, that would be [employer] location.  She took the information regarding concern for appropriate size w/c for Mr. Drews and PT's follow up request from wife that new, appropriate size w/c be delivered.  She states she will contact the [employer] office personally and ask them to follow up and try to get w/c delivered to patient.  She took my contact information for further follow-up if needed.  Lonia Blood, PT 05/09/23 12:00 PM Phone: [phone] Fax: [phone]

## 2023-05-12 ENCOUNTER — Telehealth: Payer: Self-pay | Admitting: Physical Therapy

## 2023-05-12 ENCOUNTER — Ambulatory Visit: Payer: Medicare Other | Admitting: Occupational Therapy

## 2023-05-12 ENCOUNTER — Ambulatory Visit: Payer: Medicare Other

## 2023-05-12 DIAGNOSIS — M6281 Muscle weakness (generalized): Secondary | ICD-10-CM | POA: Diagnosis not present

## 2023-05-12 DIAGNOSIS — R29818 Other symptoms and signs involving the nervous system: Secondary | ICD-10-CM | POA: Diagnosis not present

## 2023-05-12 DIAGNOSIS — R293 Abnormal posture: Secondary | ICD-10-CM | POA: Diagnosis not present

## 2023-05-12 DIAGNOSIS — R2689 Other abnormalities of gait and mobility: Secondary | ICD-10-CM | POA: Diagnosis not present

## 2023-05-12 DIAGNOSIS — R2681 Unsteadiness on feet: Secondary | ICD-10-CM | POA: Diagnosis not present

## 2023-05-12 DIAGNOSIS — G20A2 Parkinson's disease without dyskinesia, with fluctuations: Secondary | ICD-10-CM | POA: Diagnosis not present

## 2023-05-12 NOTE — Telephone Encounter (Signed)
Called and spoke with [name] today.  She stated they contacted Dr. Jarold Motto to request new order for w/c (per her conversation with [employer] on Friday).  She had someone from [employer] come out to deliver correct size w/c late Friday, but it was the same size (too large) as the previous w/c, so pt and wife declined [employer] to deliver that.  Wife reports she will follow up with Dr. Norval Gable office again and may look into option of going with another company to try to get smaller, correct size w/c.  Lonia Blood, PT 05/12/23 9:23 AM Phone: [phone] Fax: [phone]

## 2023-05-12 NOTE — Therapy (Signed)
OUTPATIENT OCCUPATIONAL THERAPY PARKINSON'S  Treatment Note  Patient Name: [name] MRN: [id number] DOB:1936/08/02, 87 y.o., male Today's Date: 05/12/2023  PCP: Garlan Fillers, MD REFERRING PROVIDER: Huston Foley, MD  END OF SESSION:  OT End of Session - 05/12/23 1507     Visit Number 2    Number of Visits 17    Date for OT Re-Evaluation 06/20/23    Authorization Type Medicare A &B    OT Start Time 1406    OT Stop Time 1452    OT Time Calculation (min) 46 min              Past Medical History:  Diagnosis Date   Ankylosing spondylitis (HCC) dx'd ~ 1974   Arthritis    "back" (08/10/2015)   BENIGN PROSTATIC HYPERTROPHY, WITH OBSTRUCTION 01/15/2010   Bleeding duodenal ulcer    Bleeding esophageal ulcer    Bleeding stomach ulcer    GERD 02/22/2008   History of blood transfusion 1988   "lost ~ 1/2 of my blood volume from multiple bleeding ulcers"   History of hiatal hernia    HYPERGLYCEMIA 11/18/2007   HYPERLIPIDEMIA 02/22/2008   HYPERTENSION, UNSPECIFIED 11/10/2009   Kidney stones    Osteoarthritis    c-spine   PEPTIC ULCER DISEASE 11/17/2007   Situational depression    "son died in MVA 07/06/2015"   TIA (transient ischemic attack)    "not that I know of in the past; they are trying to determine if I've had one today (08/10/2015)"   Past Surgical History:  Procedure Laterality Date   CATARACT EXTRACTION W/ INTRAOCULAR LENS  IMPLANT, BILATERAL Bilateral 2013   Patient Active Problem List   Diagnosis Date Noted   Ankylosing spondylitis of cervicothoracic region (HCC) 10/16/2016   TIA (transient ischemic attack) 08/10/2015   Carotid stenosis 08/04/2014   Hyperlipidemia 02/21/2012   BENIGN PROSTATIC HYPERTROPHY, WITH OBSTRUCTION 01/15/2010   Essential hypertension 11/10/2009   GERD 02/22/2008   NEPHROLITHIASIS, HX OF 11/17/2007    ONSET DATE: referral date 03/25/23  REFERRING DIAG: G20.A2 (ICD-10-CM) - Parkinson's disease without dyskinesia, with  fluctuations R26.81 (ICD-10-CM) - Unsteadiness on feet R41.841 (ICD-10-CM) - Cognitive communication deficit R27.8 (ICD-10-CM) - Other lack of coordination R26.2 (ICD-10-CM) - Difficulty in walking, not elsewhere classified  THERAPY DIAG:  Unsteadiness on feet  Muscle weakness (generalized)  Other symptoms and signs involving the nervous system  Rationale for Evaluation and Treatment: Rehabilitation  SUBJECTIVE:   SUBJECTIVE STATEMENT: Pt reports things have been going "okay".  Pt's spouse reports a w/c was delivered but it will not fit through doorways, they are still awaiting the correct size w/c.  Pt accompanied by: self and significant other (spouse, [name])  PERTINENT HISTORY: history of Parkinson's and ankylosing spondylitis   PRECAUTIONS: Fall  WEIGHT BEARING RESTRICTIONS: No  PAIN:  Are you having pain? Yes: NPRS scale: "stiffness"/10   FALLS: Has patient fallen in last 6 months? Yes. Number of falls has had multiple falls over the last few months with multiple in early July, and then 2 falls this month.  LIVING ENVIRONMENT: Lives with: lives with their spouse Lives in: House/apartment Stairs: Yes: External: 2-3 steps; has a ramp at entrance with raised railings Internal: steps to basement and steps to loft - however pt does not need to go up/down those at this time. Has following equipment at home: Dan Humphreys - 2 wheeled, Environmental consultant - 4 wheeled, bed side commode, Shower chair, Ramped entry, and transport chair  PLOF: Requires assistive device  for independence and Needs assistance with ADLs  PATIENT GOALS: to be able to move without concern/serious concern  OBJECTIVE:   HAND DOMINANCE: Right  ADLs: Overall ADLs: Pt reports there are times that he can be "fairly independent" with dressing but there are times where spouse has to provide significant assistance with. Transfers/ambulation related to ADLs: Utilizing RW and will occasionally walk bouts through the house without  RW.  If pt loses balance backwards he is unable to correct. Eating: "fumble fingered" UB Dressing: occasional assistance LB Dressing: socks and shoes are quite difficult Toileting: "I go to the bathroom by myself" but has difficulty getting up/down from toilet Bathing: washing at the sink, spouse will assist  Tub Shower transfers: pt feels uncomfortable in the shower with shower chair, but has not fallen Equipment: Shower seat with back and Walk in shower with 5.5" ledge with 30" door  IADLs: Light housekeeping: washes dishes when he is feeling strong, however frequent onset of weakness Meal Prep: Pt was primary cook prior to fall in 2023, but has returned to participation in aspects of meal prep Community mobility: recently renewed drivers license, but is not driving  MOBILITY STATUS: Needs Assist: CGA with mobility with RW and Hx of falls   POSTURE COMMENTS:  rounded shoulders and forward head, posterior pelvic tilt and R lean  FUNCTIONAL OUTCOME MEASURES: Fastening/unfastening 3 buttons: unable to button any buttons in 1 mins Physical performance test: PPT#2 (simulated eating) 22.79 sec & PPT#4 (donning/doffing jacket): TBD  COORDINATION: Box and Blocks:  Right 30 blocks, Left 32 blocks  UE ROM:  all movements are slowed, decreased shoulder flexion d/t ankylosing spondylitis   UE MMT:    grossly 4/5 overall  COGNITION: Overall cognitive status:  Spouse reports thinking continues to be slower  OBSERVATIONS: Bradykinesia   TODAY'S TREATMENT:                                                                         DATE:  05/12/23 Don/doff jacket: 1:05.84 sec.  Pt with difficulty advancing jacking around back. 5x sit > stand: 36.4 sec.  Pt with difficulty standing without UE support.  OT allowed pt to utilize RW to stabilize when standing fully upright - however pt then pulling upright to stand on RW. OT educating on decreased safety with pulling up on RW.   Sit > stand: Massed  practice with sit > stand with use of mirror for visual feedback to aid in sit > stand.  Pt continuing to require mod-max verbal cues for sequencing and foot placement.  Pt frequently with LOB backwards, requiring cues for upright posture.  Pt demonstrating freezing when transitioning fully upright.    04/23/23 Educated on sitting upright in supported sitting for increased trunk control when engaging in self-care tasks as well as other functional tasks as able due to posterior tilt in sitting limiting participation and success.   PATIENT EDUCATION: Education details: ongoing condition specific education Person educated: Patient and Spouse Education method: Explanation Education comprehension: verbalized understanding and needs further education  HOME EXERCISE PROGRAM: TBD  GOALS: Goals reviewed with patient? Yes  SHORT TERM GOALS: Target date: 05/23/23  Pt will be independent in full body HEP with focus on large  amplitude movements as needed to increase independence with ADLs.  Baseline: Goal status: IN PROGRESS  2.  Pt will perform dynamic standing task for 5 mins as needed for simple IADLs w/o LOB using DME and/or countertop support prn  Baseline:  Goal status: IN PROGRESS  3.  Pt will demonstrate improved ease with sit > stand to get up from toilet or other seating options in home by improving score on sit > stand to decreased fall risk.  Baseline: time TBD Goal status: IN PROGRESS  4.  Pt will demonstrate improved sitting balance and trunk control to reach towards floor to retreive items to allow progression towards LB dressing. Baseline:  Goal status: IN PROGRESS   LONG TERM GOALS: Target date: 06/20/23  Pt will complete UB dressing, to include clothing fasteners, at Mod I level with use of AE and/or alternative strategies PRN.  Baseline:  Goal status: IN PROGRESS  2.  Pt will be able to don socks and shoes with Supervision/setup with use of AE and/or alternative  strategies PRN.  Baseline:  Goal status: IN PROGRESS  3.  Pt will demonstrate increased ease with dressing as evidenced by decreasing PPT#4(don/ doff jacket) by 10 secs or more.  Baseline: time TBD Goal status: IN PROGRESS  4.  Pt will demonstrate improved UE functional use for ADLs as evidenced by increasing box/ blocks score by 4 blocks with BUE.  Baseline:  Right 30 blocks, Left 32 blocks Goal status: IN PROGRESS  5.  Pt will demonstrate improved dynamic standing balance for 10 mins as needed to engage in ADLs/IADLs with increased safety/endurance.  Baseline:  Goal status: IN PROGRESS  ASSESSMENT:  CLINICAL IMPRESSION: Pt requiring increased physical assistance and cues for anterior weight shifting with sit > stand and foot placement for sit > stand.  Pt with increased freezing and shuffling gait with ambulatory transfer back to waiting room.  Pt somewhat benefiting from massed practice with sit > stand, demonstrating some carryover but after rest breaks pt would require additional cues and/or assistance again.  PERFORMANCE DEFICITS: in functional skills including ADLs, IADLs, coordination, dexterity, ROM, strength, pain, flexibility, Fine motor control, Gross motor control, balance, body mechanics, endurance, decreased knowledge of precautions, decreased knowledge of use of DME, and UE functional use and psychosocial skills including coping strategies, environmental adaptation, and routines and behaviors.   IMPAIRMENTS: are limiting patient from ADLs and IADLs.    PLAN:  OT FREQUENCY: 2x/week  OT DURATION: 8 weeks  PLANNED INTERVENTIONS: self care/ADL training, therapeutic exercise, therapeutic activity, neuromuscular re-education, balance training, functional mobility training, ultrasound, compression bandaging, moist heat, cryotherapy, patient/family education, cognitive remediation/compensation, psychosocial skills training, energy conservation, coping strategies training, and  DME and/or AE instructions  RECOMMENDED OTHER SERVICES: NA  CONSULTED AND AGREED WITH PLAN OF CARE: Patient  PLAN FOR NEXT SESSION: assess don/doff jacket, initiate large amplitude HEP, trial bag exercises   Keysi Oelkers, OTR/L 05/12/2023, 3:08 PM

## 2023-05-13 NOTE — Therapy (Signed)
OUTPATIENT PHYSICAL THERAPY NEURO TREATMENT   Patient Name: [name] MRN: [id number] DOB:[birth year], 87 y.o., male Today's Date: 05/14/2023   PCP: Garlan Fillers REFERRING PROVIDER: Huston Foley, MD  END OF SESSION:  PT End of Session - 05/14/23 1551     Visit Number 5    Number of Visits 17   recert completed 04/29/2023   Date for PT Re-Evaluation 06/20/23    Authorization Type Medicare/[employer]-NEEDS KX due to previous PT and speech this year    Progress Note Due on Visit 10    PT Start Time 1446    PT Stop Time 1537    PT Time Calculation (min) 51 min    Equipment Utilized During Treatment Gait belt    Activity Tolerance Patient tolerated treatment well    Behavior During Therapy WFL for tasks assessed/performed                 Past Medical History:  Diagnosis Date   Ankylosing spondylitis (HCC) dx'd ~ 1974   Arthritis    "back" (08/10/2015)   BENIGN PROSTATIC HYPERTROPHY, WITH OBSTRUCTION 01/15/2010   Bleeding duodenal ulcer    Bleeding esophageal ulcer    Bleeding stomach ulcer    GERD 02/22/2008   History of blood transfusion 1988   "lost ~ 1/2 of my blood volume from multiple bleeding ulcers"   History of hiatal hernia    HYPERGLYCEMIA 11/18/2007   HYPERLIPIDEMIA 02/22/2008   HYPERTENSION, UNSPECIFIED 11/10/2009   Kidney stones    Osteoarthritis    c-spine   PEPTIC ULCER DISEASE 11/17/2007   Situational depression    "son died in MVA 06-30-15"   TIA (transient ischemic attack)    "not that I know of in the past; they are trying to determine if I've had one today (08/10/2015)"   Past Surgical History:  Procedure Laterality Date   CATARACT EXTRACTION W/ INTRAOCULAR LENS  IMPLANT, BILATERAL Bilateral 2013   Patient Active Problem List   Diagnosis Date Noted   Ankylosing spondylitis of cervicothoracic region (HCC) 10/16/2016   TIA (transient ischemic attack) 08/10/2015   Carotid stenosis 08/04/2014   Hyperlipidemia 02/21/2012   BENIGN PROSTATIC  HYPERTROPHY, WITH OBSTRUCTION 01/15/2010   Essential hypertension 11/10/2009   GERD 02/22/2008   NEPHROLITHIASIS, HX OF 11/17/2007    ONSET DATE: "several years"  REFERRING DIAG: G20.A2 (ICD-10-CM) - Parkinson's disease with fluctuating manifestations, unspecified whether dyskinesia present R26.81 (ICD-10-CM) - Unsteadiness on feet R41.841 (ICD-10-CM) - Cognitive communication deficit R27.8 (ICD-10-CM) - Other lack of coordination R26.2 (ICD-10-CM) - Difficulty in walking, not elsewhere classified  THERAPY DIAG:  Unsteadiness on feet  Muscle weakness (generalized)  Other symptoms and signs involving the nervous system  Other abnormalities of gait and mobility  Abnormal posture  Rationale for Evaluation and Treatment: Rehabilitation  SUBJECTIVE:  SUBJECTIVE STATEMENT: Wife reports that the w/c company left a 16" w/c without a cushion at their house but it is still not narrow enough. Looking into getting 14"x16" w/c or borrowing a transport chair. Pt reports a fall this AM when he was getting up from a chair; wife was able to get him up. Showing a bruise on the R MCP of 5th digit.  Pt accompanied by: significant other  PERTINENT HISTORY: PD, complex medical history of peptic ulcer disease with history of upper GI bleed, TIA, depression, degenerative disc disease in the neck, osteoarthritis, kidney stones, hyperlipidemia, hypertension, history of hiatal hernia, BPH, arthritis, and ankylosing spondylitis, and parkinsonism   PAIN:  Are you having pain? Yes: NPRS scale: 0/10 Pain location: low to mid back Pain description: sore Aggravating factors: recent falls, sitting up Relieving factors: rest  PRECAUTIONS: Fall  RED FLAGS: None   WEIGHT BEARING RESTRICTIONS: No  FALLS: Has patient fallen  in last 6 months? Yes. Number of falls unknown, multiple  LIVING ENVIRONMENT: Lives with: lives with their spouse Lives in: House/apartment Stairs: Ramp to enter, stairs to loft and basement, ground floor set-up Has following equipment at home: Dan Humphreys - 2 wheeled  PLOF: Needs assistance with ADLs, Needs assistance with homemaking, and Needs assistance with gait, supervision for ambulation at household distances  PATIENT GOALS: improve balance, walking, reduce falls  OBJECTIVE:      TODAY'S TREATMENT: 05/14/23 Activity Comments  Sit to stand/stand to sit x2 Multiple attempts to stand; heavy cueing for set up and to increase trunk lean. Upon standing pt still demo'd retropulsion, requiring cues to "push belly towards my hand" or "take a step" with limited improvement. Min-mod A  Gait training U-step x178ft Long duration required to complete d/t multiple freezing episodes; cueing to step to laser, min A and PT required to push the walker forward   Stand pivot transfer into car Heavy verbal cueing for foot and hand placement and min A to control retropulsion during turning.                    PATIENT EDUCATION: Education details: discussed pt's experience with w/c and recommended to borrow transport chair for max safety; edu on pros/cons of U step; advised not to perform standing exercises at home still d/t continued falls  Person educated: Patient and Spouse Education method: Explanation and Demonstration Education comprehension: verbalized understanding   Access Code: [email or web address] URL: [email or web address] Date: 05/08/2023 Prepared by: [facility] - Outpatient  Rehab - [facility]  Program Notes perform with wife's supervision  Exercises - Mini Squat with Counter Support  - 1 x daily - 5 x weekly - 2 sets - 10 reps - Staggered Stance Forward Backward Weight Shift with Counter Support  - 1 x daily - 5 x weekly - 2 sets - 10 reps - Standing Quarter Turn with  Counter Support  - 1 x daily - 5 x weekly - 2 sets - 5 reps - Seated Long Arc Quad with Ankle Weight  - 1 x daily - 5 x weekly - 3 sets - 10 reps - Seated Hip Flexion March with Ankle Weights  - 1 x daily - 5 x weekly - 3 sets - 10 reps - Seated Active Hip Flexion  - 1 x daily - 7 x weekly - 1 sets - 10 reps - 5-10 sec hold        ------------------------------------------------- Objective measures below taken at initial evaluation:  DIAGNOSTIC FINDINGS: n/a for  episode  COGNITION: Overall cognitive status: History of cognitive impairments - at baseline   SENSATION: Neuropathy in bilat feet  COORDINATION: Difficulty with rapid alternating movements    MUSCLE TONE: NT   POSTURE: rounded shoulders, forward head, flexed trunk , and head down , kyphosis  LOWER EXTREMITY ROM:     Active  Right Eval Left Eval  Hip flexion    Hip extension    Hip abduction    Hip adduction    Hip internal rotation    Hip external rotation    Knee flexion 120 120  Knee extension 0 0  Ankle dorsiflexion 5 12  Ankle plantarflexion    Ankle inversion    Ankle eversion     (Blank rows = not tested)  LOWER EXTREMITY MMT:    BLE strength grossly 3+/5  BED MOBILITY:  Mod A  TRANSFERS: Assistive device utilized: Environmental consultant - 2 wheeled  Sit to stand: CGA and Min A Stand to sit: SBA Chair to chair: SBA and CGA Floor: Mod A and Max A  RAMP:  Level of Assistance: CGA Assistive device utilized: Environmental consultant - 2 wheeled Ramp Comments:   CURB:  Level of Assistance: CGA Assistive device utilized: Environmental consultant - 2 wheeled Curb Comments:   STAIRS: NT  GAIT: Gait pattern: step to pattern, decreased stride length, and poor foot clearance- Right Distance walked: 100 Assistive device utilized: Walker - 2 wheeled Level of assistance: SBA and CGA Comments: level surfaces  FUNCTIONAL TESTS:  5 times sit to stand: 45 sec w/ CGA Timed up and go (TUG): 1 min 27 sec  M-CTSIB  Condition 1: Firm  Surface, EO 10 Sec, Severe and retro-LOB  Sway  Condition 2: Firm Surface, EC 3 Sec, Severe and retro-LOB  Sway  Condition 3: Foam Surface, EO  Sec,  Sway  Condition 4: Foam Surface, EC  Sec,  Sway      TODAY'S TREATMENT:                                                                                                                              DATE: 04/23/23 Gait training: trials with visual cue on RW for step length. Training in U-step for stride length    PATIENT EDUCATION: Education details: assessment details, gait training in use of visual cues/reference Person educated: Patient and Spouse Education method: Explanation Education comprehension: verbalized understanding  HOME EXERCISE PROGRAM: TBD  GOALS: Goals reviewed with patient? Yes  SHORT TERM GOALS: UPDATED Target date: 05/15/2023>05/23/2023    Patient will be independent in HEP to improve functional outcomes Baseline: Goal status: IN PROGRESS  2.  Teach-back strategies to reduce freezing of gait during turns Baseline: 16 steps to complete 180 deg turn Goal status: IN PROGRESS  3.  Demo improved safety and independence with sit to stand completing transfers with set-up assist Baseline: CGA-min A Goal status: IN PROGRESS    LONG TERM GOALS: UPDATED Target date: 06/05/2023>06/20/2023  Demo reduced risk for falls per time 35 sec TUG test Baseline: 1 min 27 sec Goal status: IN PROGRESS  2.  Demo improved BLE strength and balance per time of 25 sec 5xSTS test to increase independence with mobility Baseline: 45 sec Goal status: IN PROGRESS  3.  Demo improved safety with ambulation on level surfaces w/ supervision x 150 ft Baseline: SBA-CGA Goal status: IN PROGRESS    ASSESSMENT:  CLINICAL IMPRESSION: Patient arrived to session with wife who reports continued issues with w/c in the home. Fall this AM which resulting in bruise over R MCP of 5th digit. Trialed gait training with U step- this device did  not seem to improve initiation of stepping or continuity of gait. Patient actually had difficulty pushing the AD forward d/t weight of the walker. Heavy cueing and up to min-mod A required with gait to correct retropulsion. Escorted pt in w/c to car d/t safety and strong retropulsion. Patient struggled to perform stand pivot into car and required heavy verbal/manual cueing to maintain safely and large enough steps to complete transfer. Encouraged wife to borrow transport chair as patient continues with strong retropulsion and falls.  OBJECTIVE IMPAIRMENTS: Abnormal gait, decreased activity tolerance, decreased balance, decreased coordination, decreased mobility, difficulty walking, decreased ROM, decreased strength, improper body mechanics, and postural dysfunction.   ACTIVITY LIMITATIONS: carrying, lifting, standing, stairs, transfers, bed mobility, and locomotion level  PARTICIPATION LIMITATIONS: meal prep, cleaning, laundry, interpersonal relationship, community activity, and exercise routine  PERSONAL FACTORS: Age, Time since onset of injury/illness/exacerbation, and 3+ comorbidities: PMH  are also affecting patient's functional outcome.   REHAB POTENTIAL: Good  CLINICAL DECISION MAKING: Evolving/moderate complexity  EVALUATION COMPLEXITY: Moderate  PLAN:  PT FREQUENCY: 1-2x/week  PT DURATION: 8 weeks  PLANNED INTERVENTIONS: Therapeutic exercises, Therapeutic activity, Neuromuscular re-education, Balance training, Gait training, Patient/Family education, Self Care, and Joint mobilization  PLAN FOR NEXT SESSION: encourage wife to speak to pt's Neurologist d/t pt's continued strong retropulsion and falls; perform oculomotor testing to clear for red flag signs (does patient have normal inferior orbital ROM?)   review seated HEP and try again standing exercises given as HEP; Work on review of sit<>stand transfers (focus on wide/staggered stance to prevent retropulsion upon standing);  squats, partial sit to stand to focus on increased forward weightshift.  Consider stagger stance forward/back rocking at counter or RW (watch, as pt's response time is slowed to new/novel tasks).  carryover of gait strategies for stride? HEP for LE strength (sit-stand at sink).     Anette Guarneri, PT, DPT 05/14/23 5:34 PM  [facility] [address] [place], [address] [place], [place] [address] Phone # [phone] Fax # [phone]

## 2023-05-14 ENCOUNTER — Encounter: Payer: Self-pay | Admitting: Physical Therapy

## 2023-05-14 ENCOUNTER — Ambulatory Visit: Payer: Medicare Other | Admitting: Occupational Therapy

## 2023-05-14 ENCOUNTER — Ambulatory Visit: Payer: Medicare Other | Admitting: Physical Therapy

## 2023-05-14 DIAGNOSIS — R2681 Unsteadiness on feet: Secondary | ICD-10-CM

## 2023-05-14 DIAGNOSIS — G20A2 Parkinson's disease without dyskinesia, with fluctuations: Secondary | ICD-10-CM | POA: Diagnosis not present

## 2023-05-14 DIAGNOSIS — R293 Abnormal posture: Secondary | ICD-10-CM | POA: Diagnosis not present

## 2023-05-14 DIAGNOSIS — M6281 Muscle weakness (generalized): Secondary | ICD-10-CM | POA: Diagnosis not present

## 2023-05-14 DIAGNOSIS — R2689 Other abnormalities of gait and mobility: Secondary | ICD-10-CM

## 2023-05-14 DIAGNOSIS — R29818 Other symptoms and signs involving the nervous system: Secondary | ICD-10-CM

## 2023-05-14 NOTE — Therapy (Signed)
OUTPATIENT OCCUPATIONAL THERAPY [facility]  Treatment Note  Patient Name: [name] MRN: [id number] DOB:Aug 03, 1936, 87 y.o., male Today's Date: 05/14/2023  PCP: Garlan Fillers, MD REFERRING PROVIDER: Huston Foley, MD  END OF SESSION:  OT End of Session - 05/14/23 1438     Visit Number 3    Number of Visits 17    Date for OT Re-Evaluation 06/20/23    Authorization Type Medicare A &B    OT Start Time 1400    OT Stop Time 1442    OT Time Calculation (min) 42 min               Past Medical History:  Diagnosis Date   Ankylosing spondylitis (HCC) dx'd ~ 1974   Arthritis    "back" (08/10/2015)   BENIGN PROSTATIC HYPERTROPHY, WITH OBSTRUCTION 01/15/2010   Bleeding duodenal ulcer    Bleeding esophageal ulcer    Bleeding stomach ulcer    GERD 02/22/2008   History of blood transfusion 1988   "lost ~ 1/2 of my blood volume from multiple bleeding ulcers"   History of hiatal hernia    HYPERGLYCEMIA 11/18/2007   HYPERLIPIDEMIA 02/22/2008   HYPERTENSION, UNSPECIFIED 11/10/2009   Kidney stones    Osteoarthritis    c-spine   PEPTIC ULCER DISEASE 11/17/2007   Situational depression    "son died in MVA 06-24-15"   TIA (transient ischemic attack)    "not that I know of in the past; they are trying to determine if I've had one today (08/10/2015)"   Past Surgical History:  Procedure Laterality Date   CATARACT EXTRACTION W/ INTRAOCULAR LENS  IMPLANT, BILATERAL Bilateral 2013   Patient Active Problem List   Diagnosis Date Noted   Ankylosing spondylitis of cervicothoracic region (HCC) 10/16/2016   TIA (transient ischemic attack) 08/10/2015   Carotid stenosis 08/04/2014   Hyperlipidemia 02/21/2012   BENIGN PROSTATIC HYPERTROPHY, WITH OBSTRUCTION 01/15/2010   Essential hypertension 11/10/2009   GERD 02/22/2008   NEPHROLITHIASIS, HX OF 11/17/2007    ONSET DATE: referral date 03/25/23  REFERRING DIAG: G20.A2 (ICD-10-CM) - Parkinson's disease without dyskinesia, with  fluctuations R26.81 (ICD-10-CM) - Unsteadiness on feet R41.841 (ICD-10-CM) - Cognitive communication deficit R27.8 (ICD-10-CM) - Other lack of coordination R26.2 (ICD-10-CM) - Difficulty in walking, not elsewhere classified  THERAPY DIAG:  Unsteadiness on feet  Muscle weakness (generalized)  Other symptoms and signs involving the nervous system  Rationale for Evaluation and Treatment: Rehabilitation  SUBJECTIVE:   SUBJECTIVE STATEMENT: Pt reports doing pretty well until mid morning today.  Pt and spouse report that he was getting up from chair and turning and got tangled up, then began shuffling backwards, and then fell - hitting his back on a magazine rack.     Pt accompanied by: self and significant other (spouse, [name])  PERTINENT HISTORY: history of Parkinson's and ankylosing spondylitis   PRECAUTIONS: Fall  WEIGHT BEARING RESTRICTIONS: No  PAIN:  Are you having pain? Yes: NPRS scale: "stiffness"/10   FALLS: Has patient fallen in last 6 months? Yes. Number of falls has had multiple falls over the last few months with multiple in early July, and then 2 falls this month.  LIVING ENVIRONMENT: Lives with: lives with their spouse Lives in: House/apartment Stairs: Yes: External: 2-3 steps; has a ramp at entrance with raised railings Internal: steps to basement and steps to loft - however pt does not need to go up/down those at this time. Has following equipment at home: Dan Humphreys - 2 wheeled, Environmental consultant - 4  wheeled, bed side commode, Shower chair, Ramped entry, and transport chair  PLOF: Requires assistive device for independence and Needs assistance with ADLs  PATIENT GOALS: to be able to move without concern/serious concern  OBJECTIVE:   HAND DOMINANCE: Right  ADLs: Overall ADLs: Pt reports there are times that he can be "fairly independent" with dressing but there are times where spouse has to provide significant assistance with. Transfers/ambulation related to ADLs: Utilizing  RW and will occasionally walk bouts through the house without RW.  If pt loses balance backwards he is unable to correct. Eating: "fumble fingered" UB Dressing: occasional assistance LB Dressing: socks and shoes are quite difficult Toileting: "I go to the bathroom by myself" but has difficulty getting up/down from toilet Bathing: washing at the sink, spouse will assist  Tub Shower transfers: pt feels uncomfortable in the shower with shower chair, but has not fallen Equipment: Shower seat with back and Walk in shower with 5.5" ledge with 30" door  IADLs: Light housekeeping: washes dishes when he is feeling strong, however frequent onset of weakness Meal Prep: Pt was primary cook prior to fall in 2023, but has returned to participation in aspects of meal prep Community mobility: recently renewed drivers license, but is not driving  MOBILITY STATUS: Needs Assist: CGA with mobility with RW and Hx of falls   POSTURE COMMENTS:  rounded shoulders and forward head, posterior pelvic tilt and R lean  FUNCTIONAL OUTCOME MEASURES: Fastening/unfastening 3 buttons: unable to button any buttons in 1 mins Physical performance test: PPT#2 (simulated eating) 22.79 sec & PPT#4 (donning/doffing jacket): TBD  COORDINATION: Box and Blocks:  Right 30 blocks, Left 32 blocks  UE ROM:  all movements are slowed, decreased shoulder flexion d/t ankylosing spondylitis   UE MMT:    grossly 4/5 overall  COGNITION: Overall cognitive status:  Spouse reports thinking continues to be slower  OBSERVATIONS: Bradykinesia   TODAY'S TREATMENT:                                                                         DATE:  05/14/23 Bag exercises: Simulated to carry over to ADLs for the following activities (using a plastic bag): BUE horizontal abduction to reach in front, reaching behind back, and over shoulder to simulate movements as needed for UB dressing and drying back.  OT providing visual demonstration and verbal  cues, especially when reaching behind back.  Utilized Ship broker for [employer], still benefiting from cues for large extension and upright sitting posture.  BP 133/70    05/12/23 Don/doff jacket: 1:05.84 sec.  Pt with difficulty advancing jacking around back. 5x sit > stand: 36.4 sec.  Pt with difficulty standing without UE support.  OT allowed pt to utilize RW to stabilize when standing fully upright - however pt then pulling upright to stand on RW. OT educating on decreased safety with pulling up on RW.   Sit > stand: Massed practice with sit > stand with use of mirror for visual feedback to aid in sit > stand.  Pt continuing to require mod-max verbal cues for sequencing and foot placement.  Pt frequently with LOB backwards, requiring cues for upright posture.  Pt demonstrating freezing when transitioning fully upright.  04/23/23 Educated on sitting upright in supported sitting for increased trunk control when engaging in self-care tasks as well as other functional tasks as able due to posterior tilt in sitting limiting participation and success.   PATIENT EDUCATION: Education details: ongoing condition specific education Person educated: Patient and Spouse Education method: Explanation Education comprehension: verbalized understanding and needs further education  HOME EXERCISE PROGRAM: TBD  GOALS: Goals reviewed with patient? Yes  SHORT TERM GOALS: Target date: 05/23/23  Pt will be independent in full body HEP with focus on large amplitude movements as needed to increase independence with ADLs.  Baseline: Goal status: IN PROGRESS  2.  Pt will perform dynamic standing task for 5 mins as needed for simple IADLs w/o LOB using DME and/or countertop support prn  Baseline:  Goal status: IN PROGRESS  3.  Pt will demonstrate improved ease with sit > stand to get up from toilet or other seating options in home by improving score on sit > stand to decreased fall risk.  Baseline:  time TBD Goal status: IN PROGRESS  4.  Pt will demonstrate improved sitting balance and trunk control to reach towards floor to retreive items to allow progression towards LB dressing. Baseline:  Goal status: IN PROGRESS   LONG TERM GOALS: Target date: 06/20/23  Pt will complete UB dressing, to include clothing fasteners, at Mod I level with use of AE and/or alternative strategies PRN.  Baseline:  Goal status: IN PROGRESS  2.  Pt will be able to don socks and shoes with Supervision/setup with use of AE and/or alternative strategies PRN.  Baseline:  Goal status: IN PROGRESS  3.  Pt will demonstrate increased ease with dressing as evidenced by decreasing PPT#4(don/ doff jacket) by 10 secs or more.  Baseline: time TBD Goal status: IN PROGRESS  4.  Pt will demonstrate improved UE functional use for ADLs as evidenced by increasing box/ blocks score by 4 blocks with BUE.  Baseline:  Right 30 blocks, Left 32 blocks Goal status: IN PROGRESS  5.  Pt will demonstrate improved dynamic standing balance for 10 mins as needed to engage in ADLs/IADLs with increased safety/endurance.  Baseline:  Goal status: IN PROGRESS  ASSESSMENT:  CLINICAL IMPRESSION: Pt demonstrating increased hunched posture this session, requiring verbal, tactile, and visual cues for upright posture.  Pt limited in extension at trunk as well as BUE extension during large amplitude, bag exercises.  Pt with increased freezing and shuffling with transfers, particularly with turns when approaching therapy mat this session.    PERFORMANCE DEFICITS: in functional skills including ADLs, IADLs, coordination, dexterity, ROM, strength, pain, flexibility, Fine motor control, Gross motor control, balance, body mechanics, endurance, decreased knowledge of precautions, decreased knowledge of use of DME, and UE functional use and psychosocial skills including coping strategies, environmental adaptation, and routines and behaviors.    IMPAIRMENTS: are limiting patient from ADLs and IADLs.    PLAN:  OT FREQUENCY: 2x/week  OT DURATION: 8 weeks  PLANNED INTERVENTIONS: self care/ADL training, therapeutic exercise, therapeutic activity, neuromuscular re-education, balance training, functional mobility training, ultrasound, compression bandaging, moist heat, cryotherapy, patient/family education, cognitive remediation/compensation, psychosocial skills training, energy conservation, coping strategies training, and DME and/or AE instructions  RECOMMENDED OTHER SERVICES: NA  CONSULTED AND AGREED WITH PLAN OF CARE: Patient  PLAN FOR NEXT SESSION:  initiate large amplitude HEP, review bag exercises   Donnarae Rae, OTR/L 05/14/2023, 2:39 PM

## 2023-05-14 NOTE — Telephone Encounter (Signed)
Last seen on 04/10/23 Follow up scheduled on 10/20/23 Too soon to refill, next refill will be in Oct.

## 2023-05-14 NOTE — Patient Instructions (Signed)
Bag Exercises:  Small trash bag or produce bag works best.  For all exercises, sit with big posture (sit up tall with head up) and use big movements. Perform the following exercises 1-2 times per day.  Hold bag in one hand. Stretch both arms/hands out to the side as big as you can. Then, pass bag from one hand to the other IN FRONT of you. Stretch arms back out big after each pass. Repeat 10 times. Hold bag in one hand. Stretch both arms/hands out to the side as big as you can. Then, pass bag from one hand to the other BEHIND you. Stretch arms back out big after each pass. Repeat 10 times. Hold bag in right hand. Move right hand to reach behind shoulder. Then, reach behind back with left hand to pass bag from right hand to left hand. Switch sides. Repeat 10 times on each side. Hold bag in both hands in front of you with hands/arms shoulder length apart. Move bag behind your head. Repeat 10 times. Hold bag in both hands in front of you with hands/arms shoulder length apart. Lift leg and move bag completely under each foot and back. Repeat 10 times on each side.

## 2023-05-15 DIAGNOSIS — M436 Torticollis: Secondary | ICD-10-CM | POA: Diagnosis not present

## 2023-05-15 DIAGNOSIS — R531 Weakness: Secondary | ICD-10-CM | POA: Diagnosis not present

## 2023-05-15 DIAGNOSIS — M545 Low back pain, unspecified: Secondary | ICD-10-CM | POA: Diagnosis not present

## 2023-05-15 DIAGNOSIS — M1991 Primary osteoarthritis, unspecified site: Secondary | ICD-10-CM | POA: Diagnosis not present

## 2023-05-15 DIAGNOSIS — R29898 Other symptoms and signs involving the musculoskeletal system: Secondary | ICD-10-CM | POA: Diagnosis not present

## 2023-05-15 DIAGNOSIS — Z79899 Other long term (current) drug therapy: Secondary | ICD-10-CM | POA: Diagnosis not present

## 2023-05-15 DIAGNOSIS — M459 Ankylosing spondylitis of unspecified sites in spine: Secondary | ICD-10-CM | POA: Diagnosis not present

## 2023-05-19 ENCOUNTER — Ambulatory Visit: Payer: Medicare Other | Admitting: Occupational Therapy

## 2023-05-19 ENCOUNTER — Ambulatory Visit: Payer: Medicare Other

## 2023-05-19 DIAGNOSIS — M6281 Muscle weakness (generalized): Secondary | ICD-10-CM

## 2023-05-19 DIAGNOSIS — R2681 Unsteadiness on feet: Secondary | ICD-10-CM | POA: Diagnosis not present

## 2023-05-19 DIAGNOSIS — G20A2 Parkinson's disease without dyskinesia, with fluctuations: Secondary | ICD-10-CM

## 2023-05-19 DIAGNOSIS — R262 Difficulty in walking, not elsewhere classified: Secondary | ICD-10-CM

## 2023-05-19 DIAGNOSIS — R2689 Other abnormalities of gait and mobility: Secondary | ICD-10-CM

## 2023-05-19 DIAGNOSIS — R29818 Other symptoms and signs involving the nervous system: Secondary | ICD-10-CM

## 2023-05-19 DIAGNOSIS — R293 Abnormal posture: Secondary | ICD-10-CM

## 2023-05-19 NOTE — Therapy (Signed)
OUTPATIENT OCC[facility]ATIONAL [facility]ERAPY [facility]  Treatment Note  Patient Name: [name] MRN: [id number] DOB:11-Apr-1936, 87 y.o., male Today's Date: 05/19/2023  PCP: Casey Fillers, MD REFERRING PROVIDER: Huston Foley, MD  END OF SESSION:  OT End of Session - 05/19/23 1406     Visit Number 4    Number of Visits 17    Date for OT Re-Evaluation 06/20/23    Authorization Type Medicare A &B    OT Start Time 1402    OT Stop Time 1444    OT Time Calculation (min) 42 min                Past Medical History:  Diagnosis Date   Ankylosing spondylitis (HCC) dx'd ~ 1974   Arthritis    "back" (08/10/2015)   BENIGN PROSTATIC HYPERTROPHY, WITH OBSTRUCTION 01/15/2010   Bleeding duodenal ulcer    Bleeding esophageal ulcer    Bleeding stomach ulcer    GERD 02/22/2008   History of blood transfusion 1988   "lost ~ 1/2 of my blood volume from multiple bleeding ulcers"   History of hiatal hernia    HYPERGLYCEMIA 11/18/2007   HYPERLIPIDEMIA 02/22/2008   HYPERTENSION, UNSPECIFIED 11/10/2009   Kidney stones    Osteoarthritis    c-spine   PEPTIC ULCER DISEASE 11/17/2007   Situational depression    "son died in MVA 07-01-15"   TIA (transient ischemic attack)    "not that I know of in the past; they are trying to determine if I've had one today (08/10/2015)"   Past Surgical History:  Procedure Laterality Date   CATARACT EXTRACTION W/ INTRAOCULAR LENS  IMPLANT, BILATERAL Bilateral 2013   Patient Active Problem List   Diagnosis Date Noted   Ankylosing spondylitis of cervicothoracic region (HCC) 10/16/2016   TIA (transient ischemic attack) 08/10/2015   Carotid stenosis 08/04/2014   Hyperlipidemia 02/21/2012   BENIGN PROSTATIC HYPERTROPHY, WITH OBSTRUCTION 01/15/2010   Essential hypertension 11/10/2009   GERD 02/22/2008   NEPHROLITHIASIS, HX OF 11/17/2007    ONSET DATE: referral date 03/25/23  REFERRING DIAG: G20.A2 (ICD-10-CM) - Parkinson's disease without dyskinesia, with  fluctuations R26.81 (ICD-10-CM) - Unsteadiness on feet R41.841 (ICD-10-CM) - Cognitive communication deficit R27.8 (ICD-10-CM) - Other lack of coordination R26.2 (ICD-10-CM) - Difficulty in walking, not elsewhere classified  THERAPY DIAG:  Unsteadiness on feet  Muscle weakness (generalized)  Other symptoms and signs involving the nervous system  Abnormal posture  Other abnormalities of gait and mobility  Rationale for Evaluation and Treatment: Rehabilitation  SUBJECTIVE:   SUBJECTIVE STATEMENT: Pt and spouse report that his ramp is more treacherous, especially when wet.   Pt accompanied by: self and significant other (spouse, [name])  PERTINENT HISTORY: history of Parkinson's and ankylosing spondylitis   PRECAUTIONS: Fall  WEIGHT BEARING RESTRICTIONS: No  PAIN:  Are you having pain? Yes: NPRS scale: "stiffness"/10   FALLS: Has patient fallen in last 6 months? Yes. Number of falls has had multiple falls over the last few months with multiple in early July, and then 2 falls this month.  LIVING ENVIRONMENT: Lives with: lives with their spouse Lives in: House/apartment Stairs: Yes: External: 2-3 steps; has a ramp at entrance with raised railings Internal: steps to basement and steps to loft - however pt does not need to go up/down those at this time. Has following equipment at home: Dan Humphreys - 2 wheeled, Environmental consultant - 4 wheeled, bed side commode, Shower chair, Ramped entry, and transport chair  PLOF: Requires assistive device for independence and Needs  assistance with ADLs  PATIENT GOALS: to be able to move without concern/serious concern  OBJECTIVE:   HAND DOMINANCE: Right  ADLs: Overall ADLs: Pt reports there are times that he can be "fairly independent" with dressing but there are times where spouse has to provide significant assistance with. Transfers/ambulation related to ADLs: Utilizing RW and will occasionally walk bouts through the house without RW.  If pt loses balance  backwards he is unable to correct. Eating: "fumble fingered" UB Dressing: occasional assistance LB Dressing: socks and shoes are quite difficult Toileting: "I go to the bathroom by myself" but has difficulty getting up/down from toilet Bathing: washing at the sink, spouse will assist  Tub Shower transfers: pt feels uncomfortable in the shower with shower chair, but has not fallen Equipment: Shower seat with back and Walk in shower with 5.5" ledge with 30" door  IADLs: Light housekeeping: washes dishes when he is feeling strong, however frequent onset of weakness Meal Prep: Pt was primary cook prior to fall in 2023, but has returned to participation in aspects of meal prep Community mobility: recently renewed drivers license, but is not driving  MOBILITY STATUS: Needs Assist: CGA with mobility with RW and Hx of falls   POSTURE COMMENTS:  rounded shoulders and forward head, posterior pelvic tilt and R lean  FUNCTIONAL OUTCOME MEASURES: Fastening/unfastening 3 buttons: unable to button any buttons in 1 mins Physical performance test: PPT#2 (simulated eating) 22.79 sec & PPT#4 (donning/doffing jacket): TBD  COORDINATION: Box and Blocks:  Right 30 blocks, Left 32 blocks  UE ROM:  all movements are slowed, decreased shoulder flexion d/t ankylosing spondylitis   UE MMT:    grossly 4/5 overall  COGNITION: Overall cognitive status:  Spouse reports thinking continues to be slower  OBSERVATIONS: Bradykinesia   TODAY'S TREATMENT:                                                                         DATE:  05/19/23 Large amplitude: engaged in reaching in front and around back with 1# medicine ball with focus on trunk control, upright sitting posture, and reaching behind and in front as needed for aspects of ADLs.  Engaged in large amplitude reaching with use of upright poles to facilitate increased horizontal reach and then diagonal reach.  Pt benefiting from targets and cues for amplitude  over pace.  Engaged in simulated drying of back with tossing towel over shoulder and reaching around back to retreive at waist.  Pt able to complete on R side with min cues, however requiring mod-max cues on L side.  Therefore engaged in massed practice, progressing to alternating after 4-5 reps to increase carryover.   LB dressing: Pt reports that spouse assists him with getting on his Depends and pants as he is frequently getting pants on in the bathroom.  Engaged in simulated LB dressing with threading BLE into gait belt to simulate pants while resting foot on ~6" stool.  OT reviewed dressing "weaker" leg first and focusing on amplitude when bending forward and threading pant leg for increased ease with donning pant leg.   05/14/23 Bag exercises: Simulated to carry over to ADLs for the following activities (using a plastic bag): BUE horizontal abduction to reach  in front, reaching behind back, and over shoulder to simulate movements as needed for UB dressing and drying back.  OT providing visual demonstration and verbal cues, especially when reaching behind back.  Utilized Ship broker for [employer], still benefiting from cues for large extension and upright sitting posture.  BP 133/70    05/12/23 Don/doff jacket: 1:05.84 sec.  Pt with difficulty advancing jacking around back. 5x sit > stand: 36.4 sec.  Pt with difficulty standing without UE support.  OT allowed pt to utilize RW to stabilize when standing fully upright - however pt then pulling upright to stand on RW. OT educating on decreased safety with pulling up on RW.   Sit > stand: Massed practice with sit > stand with use of mirror for visual feedback to aid in sit > stand.  Pt continuing to require mod-max verbal cues for sequencing and foot placement.  Pt frequently with LOB backwards, requiring cues for upright posture.  Pt demonstrating freezing when transitioning fully upright.   PATIENT EDUCATION: Education details: ongoing condition  specific education Person educated: Patient and Spouse Education method: Explanation Education comprehension: verbalized understanding and needs further education  HOME EXERCISE PROGRAM: TBD  GOALS: Goals reviewed with patient? Yes  SHORT TERM GOALS: Target date: 05/23/23  Pt will be independent in full body HEP with focus on large amplitude movements as needed to increase independence with ADLs.  Baseline: Goal status: IN PROGRESS  2.  Pt will perform dynamic standing task for 5 mins as needed for simple IADLs w/o LOB using DME and/or countertop support prn  Baseline:  Goal status: IN PROGRESS  3.  Pt will demonstrate improved ease with sit > stand to get up from toilet or other seating options in home by improving score on sit > stand to decreased fall risk.  Baseline: time TBD Goal status: IN PROGRESS  4.  Pt will demonstrate improved sitting balance and trunk control to reach towards floor to retreive items to allow progression towards LB dressing. Baseline:  Goal status: IN PROGRESS   LONG TERM GOALS: Target date: 06/20/23  Pt will complete UB dressing, to include clothing fasteners, at Mod I level with use of AE and/or alternative strategies PRN.  Baseline:  Goal status: IN PROGRESS  2.  Pt will be able to don socks and shoes with Supervision/setup with use of AE and/or alternative strategies PRN.  Baseline:  Goal status: IN PROGRESS  3.  Pt will demonstrate increased ease with dressing as evidenced by decreasing PPT#4(don/ doff jacket) by 10 secs or more.  Baseline: time TBD Goal status: IN PROGRESS  4.  Pt will demonstrate improved UE functional use for ADLs as evidenced by increasing box/ blocks score by 4 blocks with BUE.  Baseline:  Right 30 blocks, Left 32 blocks Goal status: IN PROGRESS  5.  Pt will demonstrate improved dynamic standing balance for 10 mins as needed to engage in ADLs/IADLs with increased safety/endurance.  Baseline:  Goal status: IN  PROGRESS  ASSESSMENT:  CLINICAL IMPRESSION: OT reviewed recommendations for non slip/traction to add to ramp for increased safety with going up/down ramp, especially in rain.  OT providing cues for stepping pattern with ambulation, especially when turning to sit on mat table.  Pt benefiting from cues - verbal, tactile, and visual cues for upright posture and amplitude when completing exercises with ball as well as towel.  Pt limited in extension at trunk as well as BUE extension during large amplitude, bag exercises - benefiting from  visual targets.   PERFORMANCE DEFICITS: in functional skills including ADLs, IADLs, coordination, dexterity, ROM, strength, pain, flexibility, Fine motor control, Gross motor control, balance, body mechanics, endurance, decreased knowledge of precautions, decreased knowledge of use of DME, and UE functional use and psychosocial skills including coping strategies, environmental adaptation, and routines and behaviors.   IMPAIRMENTS: are limiting patient from ADLs and IADLs.    PLAN:  OT FREQUENCY: 2x/week  OT DURATION: 8 weeks  PLANNED INTERVENTIONS: self care/ADL training, therapeutic exercise, therapeutic activity, neuromuscular re-education, balance training, functional mobility training, ultrasound, compression bandaging, moist heat, cryotherapy, patient/family education, cognitive remediation/compensation, psychosocial skills training, energy conservation, coping strategies training, and DME and/or AE instructions  RECOMMENDED OTHER SERVICES: NA  CONSULTED AND AGREED WITH PLAN OF CARE: Patient  PLAN FOR NEXT SESSION:  initiate large amplitude HEP, review bag exercises, sit > stand, LB dressing.   Rosalio Loud, OTR/L 05/19/2023, 2:06 PM

## 2023-05-19 NOTE — Therapy (Signed)
OUTPATIENT PHYSICAL THERAPY NEURO TREATMENT   Patient Name: [name] MRN: [id number] DOB:1936-08-16, 87 y.o., male Today's Date: 05/19/2023   PCP: Garlan Fillers REFERRING PROVIDER: Huston Foley, MD  END OF SESSION:  PT End of Session - 05/19/23 1453     Visit Number 6    Number of Visits 17   recert completed 04/29/2023   Date for PT Re-Evaluation 06/20/23    Authorization Type Medicare/[employer]-NEEDS KX due to previous PT and speech this year    Progress Note Due on Visit 10    PT Start Time 1455   in bathroom   PT Stop Time 1530    PT Time Calculation (min) 35 min    Equipment Utilized During Treatment Gait belt    Activity Tolerance Patient tolerated treatment well    Behavior During Therapy WFL for tasks assessed/performed                 Past Medical History:  Diagnosis Date   Ankylosing spondylitis (HCC) dx'd ~ 1974   Arthritis    "back" (08/10/2015)   BENIGN PROSTATIC HYPERTROPHY, WITH OBSTRUCTION 01/15/2010   Bleeding duodenal ulcer    Bleeding esophageal ulcer    Bleeding stomach ulcer    GERD 02/22/2008   History of blood transfusion 1988   "lost ~ 1/2 of my blood volume from multiple bleeding ulcers"   History of hiatal hernia    HYPERGLYCEMIA 11/18/2007   HYPERLIPIDEMIA 02/22/2008   HYPERTENSION, UNSPECIFIED 11/10/2009   Kidney stones    Osteoarthritis    c-spine   PEPTIC ULCER DISEASE 11/17/2007   Situational depression    "son died in MVA 07-02-2015"   TIA (transient ischemic attack)    "not that I know of in the past; they are trying to determine if I've had one today (08/10/2015)"   Past Surgical History:  Procedure Laterality Date   CATARACT EXTRACTION W/ INTRAOCULAR LENS  IMPLANT, BILATERAL Bilateral 2013   Patient Active Problem List   Diagnosis Date Noted   Ankylosing spondylitis of cervicothoracic region (HCC) 10/16/2016   TIA (transient ischemic attack) 08/10/2015   Carotid stenosis 08/04/2014   Hyperlipidemia 02/21/2012   BENIGN  PROSTATIC HYPERTROPHY, WITH OBSTRUCTION 01/15/2010   Essential hypertension 11/10/2009   GERD 02/22/2008   NEPHROLITHIASIS, HX OF 11/17/2007    ONSET DATE: "several years"  REFERRING DIAG: G20.A2 (ICD-10-CM) - Parkinson's disease with fluctuating manifestations, unspecified whether dyskinesia present R26.81 (ICD-10-CM) - Unsteadiness on feet R41.841 (ICD-10-CM) - Cognitive communication deficit R27.8 (ICD-10-CM) - Other lack of coordination R26.2 (ICD-10-CM) - Difficulty in walking, not elsewhere classified  THERAPY DIAG:  Unsteadiness on feet  Muscle weakness (generalized)  Other symptoms and signs involving the nervous system  Abnormal posture  Other abnormalities of gait and mobility  Parkinson's disease with fluctuating manifestations, unspecified whether dyskinesia present  Difficulty in walking, not elsewhere classified  Rationale for Evaluation and Treatment: Rehabilitation  SUBJECTIVE:  SUBJECTIVE STATEMENT: No new issues. Moving slowly  Pt accompanied by: significant other  PERTINENT HISTORY: PD, complex medical history of peptic ulcer disease with history of upper GI bleed, TIA, depression, degenerative disc disease in the neck, osteoarthritis, kidney stones, hyperlipidemia, hypertension, history of hiatal hernia, BPH, arthritis, and ankylosing spondylitis, and parkinsonism   PAIN:  Are you having pain? Yes: NPRS scale: 0/10 Pain location: low to mid back Pain description: sore Aggravating factors: recent falls, sitting up Relieving factors: rest  PRECAUTIONS: Fall  RED FLAGS: None   WEIGHT BEARING RESTRICTIONS: No  FALLS: Has patient fallen in last 6 months? Yes. Number of falls unknown, multiple  LIVING ENVIRONMENT: Lives with: lives with their spouse Lives in:  House/apartment Stairs: Ramp to enter, stairs to loft and basement, ground floor set-up Has following equipment at home: Dan Humphreys - 2 wheeled  PLOF: Needs assistance with ADLs, Needs assistance with homemaking, and Needs assistance with gait, supervision for ambulation at household distances  PATIENT GOALS: improve balance, walking, reduce falls  OBJECTIVE:    TODAY'S TREATMENT: 05/19/23 Activity Comments  NU-step speed intervals Assist for getting up to 100 SPM and maintaining x 30 sec  Gait training Visual reference on walker for step length to facilitate increased stride--difficulty with LLE  Single leg support -foot elevated on step and climbing hand up finger ladder   Seated physioball roll out 1x10 For trunk flexion over BOS  Sit-stand pushing on knees 2x5 Cues for trunk flexion--improved carryover  Seated/standing bounce pass physioball Cues for overhead extension and forceful throw for postural perturbation      TODAY'S TREATMENT: 05/14/23 Activity Comments  Sit to stand/stand to sit x2 Multiple attempts to stand; heavy cueing for set up and to increase trunk lean. Upon standing pt still demo'd retropulsion, requiring cues to "push belly towards my hand" or "take a step" with limited improvement. Min-mod A  Gait training U-step x190ft Long duration required to complete d/t multiple freezing episodes; cueing to step to laser, min A and PT required to push the walker forward   Stand pivot transfer into car Heavy verbal cueing for foot and hand placement and min A to control retropulsion during turning.                    PATIENT EDUCATION: Education details: discussed pt's experience with w/c and recommended to borrow transport chair for max safety; edu on pros/cons of U step; advised not to perform standing exercises at home still d/t continued falls  Person educated: Patient and Spouse Education method: Explanation and Demonstration Education comprehension: verbalized  understanding   Access Code: [email or web address] URL: [email or web address] Date: 05/08/2023 Prepared by: [facility] - Outpatient  Rehab - [facility]  Program Notes perform with wife's supervision  Exercises - Mini Squat with Counter Support  - 1 x daily - 5 x weekly - 2 sets - 10 reps - Staggered Stance Forward Backward Weight Shift with Counter Support  - 1 x daily - 5 x weekly - 2 sets - 10 reps - Standing Quarter Turn with Counter Support  - 1 x daily - 5 x weekly - 2 sets - 5 reps - Seated Long Arc Quad with Ankle Weight  - 1 x daily - 5 x weekly - 3 sets - 10 reps - Seated Hip Flexion March with Ankle Weights  - 1 x daily - 5 x weekly - 3 sets - 10 reps - Seated Active Hip Flexion  - 1 x daily -  7 x weekly - 1 sets - 10 reps - 5-10 sec hold        ------------------------------------------------- Objective measures below taken at initial evaluation:  DIAGNOSTIC FINDINGS: n/a for episode  COGNITION: Overall cognitive status: History of cognitive impairments - at baseline   SENSATION: Neuropathy in bilat feet  COORDINATION: Difficulty with rapid alternating movements    MUSCLE TONE: NT   POSTURE: rounded shoulders, forward head, flexed trunk , and head down , kyphosis  LOWER EXTREMITY ROM:     Active  Right Eval Left Eval  Hip flexion    Hip extension    Hip abduction    Hip adduction    Hip internal rotation    Hip external rotation    Knee flexion 120 120  Knee extension 0 0  Ankle dorsiflexion 5 12  Ankle plantarflexion    Ankle inversion    Ankle eversion     (Blank rows = not tested)  LOWER EXTREMITY MMT:    BLE strength grossly 3+/5  BED MOBILITY:  Mod A  TRANSFERS: Assistive device utilized: Environmental consultant - 2 wheeled  Sit to stand: CGA and Min A Stand to sit: SBA Chair to chair: SBA and CGA Floor: Mod A and Max A  RAMP:  Level of Assistance: CGA Assistive device utilized: Environmental consultant - 2 wheeled Ramp Comments:   CURB:  Level  of Assistance: CGA Assistive device utilized: Environmental consultant - 2 wheeled Curb Comments:   STAIRS: NT  GAIT: Gait pattern: step to pattern, decreased stride length, and poor foot clearance- Right Distance walked: 100 Assistive device utilized: Walker - 2 wheeled Level of assistance: SBA and CGA Comments: level surfaces  FUNCTIONAL TESTS:  5 times sit to stand: 45 sec w/ CGA Timed up and go (TUG): 1 min 27 sec  M-CTSIB  Condition 1: Firm Surface, EO 10 Sec, Severe and retro-LOB  Sway  Condition 2: Firm Surface, EC 3 Sec, Severe and retro-LOB  Sway  Condition 3: Foam Surface, EO  Sec,  Sway  Condition 4: Foam Surface, EC  Sec,  Sway      TODAY'S TREATMENT:                                                                                                                              DATE: 04/23/23 Gait training: trials with visual cue on RW for step length. Training in U-step for stride length    PATIENT EDUCATION: Education details: assessment details, gait training in use of visual cues/reference Person educated: Patient and Spouse Education method: Explanation Education comprehension: verbalized understanding  HOME EXERCISE PROGRAM: TBD  GOALS: Goals reviewed with patient? Yes  SHORT TERM GOALS: UPDATED Target date: 05/15/2023>05/23/2023    Patient will be independent in HEP to improve functional outcomes Baseline: Goal status: IN PROGRESS  2.  Teach-back strategies to reduce freezing of gait during turns Baseline: 16 steps to complete 180 deg turn Goal status: IN PROGRESS  3.  Demo improved safety and independence with sit to stand completing transfers with set-up assist Baseline: CGA-min A Goal status: IN PROGRESS    LONG TERM GOALS: UPDATED Target date: 06/05/2023>06/20/2023    Demo reduced risk for falls per time 35 sec TUG test Baseline: 1 min 27 sec Goal status: IN PROGRESS  2.  Demo improved BLE strength and balance per time of 25 sec 5xSTS test to increase  independence with mobility Baseline: 45 sec Goal status: IN PROGRESS  3.  Demo improved safety with ambulation on level surfaces w/ supervision x 150 ft Baseline: SBA-CGA Goal status: IN PROGRESS    ASSESSMENT:  CLINICAL IMPRESSION: Difficulty with ambulation with decreased LLE step length. Use of multi-modal cues to improve stride length with best performance using visual reference for step length advancement. Continued with training for functional mobility to improve trunk flexion over BOS for improved success with sit to stand transfers with physical/tactile cues to increase amplitude of trunk flexion with improved carryover progressing to hands on knees and verbal cue of trunk flexion over BOS.  Limited carryover with gait strategies when therapist cues are removed reverting back to small, shuffled steps. Continued sessions to progress POC details to improve safety with mobility. OBJECTIVE IMPAIRMENTS: Abnormal gait, decreased activity tolerance, decreased balance, decreased coordination, decreased mobility, difficulty walking, decreased ROM, decreased strength, improper body mechanics, and postural dysfunction.   ACTIVITY LIMITATIONS: carrying, lifting, standing, stairs, transfers, bed mobility, and locomotion level  PARTICIPATION LIMITATIONS: meal prep, cleaning, laundry, interpersonal relationship, community activity, and exercise routine  PERSONAL FACTORS: Age, Time since onset of injury/illness/exacerbation, and 3+ comorbidities: PMH  are also affecting patient's functional outcome.   REHAB POTENTIAL: Good  CLINICAL DECISION MAKING: Evolving/moderate complexity  EVALUATION COMPLEXITY: Moderate  PLAN:  PT FREQUENCY: 1-2x/week  PT DURATION: 8 weeks  PLANNED INTERVENTIONS: Therapeutic exercises, Therapeutic activity, Neuromuscular re-education, Balance training, Gait training, Patient/Family education, Self Care, and Joint mobilization  PLAN FOR NEXT SESSION: encourage wife  to speak to pt's Neurologist d/t pt's continued strong retropulsion and falls; perform oculomotor testing to clear for red flag signs (does patient have normal inferior orbital ROM?)   review seated HEP and try again standing exercises given as HEP; Work on review of sit<>stand transfers (focus on wide/staggered stance to prevent retropulsion upon standing); squats, partial sit to stand to focus on increased forward weightshift.  Consider stagger stance forward/back rocking at counter or RW (watch, as pt's response time is slowed to new/novel tasks).  carryover of gait strategies for stride? HEP for LE strength (sit-stand at sink).    4:25 PM, 05/19/23 M. Shary Decamp, PT, DPT Physical Therapist- [place] Office Number: [phone]

## 2023-05-19 NOTE — Therapy (Deleted)
[place] [facility] [address] [place], [place], [address] Phone: [phone]   Fax:  [phone]  Patient Details  Name: MURRAY DAMEWOOD MRN: [id number] Date of Birth: [birth year] Referring Provider:  Garlan Fillers, MD  Encounter Date: 05/19/2023   Dion Body, PT 05/19/2023, 2:54 PM  [place]  [facility] [address], [address] [place], [place], [address] Phone: [phone]   Fax:  [phone]

## 2023-05-21 ENCOUNTER — Ambulatory Visit: Payer: Medicare Other | Admitting: Occupational Therapy

## 2023-05-21 ENCOUNTER — Ambulatory Visit: Payer: Medicare Other | Admitting: Physical Therapy

## 2023-05-23 NOTE — Therapy (Signed)
OUTPATIENT PHYSICAL THERAPY NEURO TREATMENT   Patient Name: [name] MRN: [id number] DOB:November 13, 1935, 87 y.o., male Today's Date: 05/26/2023   PCP: Garlan Fillers REFERRING PROVIDER: Huston Foley, MD  END OF SESSION:  PT End of Session - 05/26/23 1522     Visit Number 7    Number of Visits 17   recert completed 04/29/2023   Date for PT Re-Evaluation 06/20/23    Authorization Type Medicare/[employer]-NEEDS KX due to previous PT and speech this year    Progress Note Due on Visit 10    PT Start Time 1448    PT Stop Time 1532   pt in bathroom   PT Time Calculation (min) 44 min    Equipment Utilized During Treatment Gait belt    Activity Tolerance Patient tolerated treatment well    Behavior During Therapy WFL for tasks assessed/performed                  Past Medical History:  Diagnosis Date   Ankylosing spondylitis (HCC) dx'd ~ 1974   Arthritis    "back" (08/10/2015)   BENIGN PROSTATIC HYPERTROPHY, WITH OBSTRUCTION 01/15/2010   Bleeding duodenal ulcer    Bleeding esophageal ulcer    Bleeding stomach ulcer    GERD 02/22/2008   History of blood transfusion 1988   "lost ~ 1/2 of my blood volume from multiple bleeding ulcers"   History of hiatal hernia    HYPERGLYCEMIA 11/18/2007   HYPERLIPIDEMIA 02/22/2008   HYPERTENSION, UNSPECIFIED 11/10/2009   Kidney stones    Osteoarthritis    c-spine   PEPTIC ULCER DISEASE 11/17/2007   Situational depression    "son died in MVA Jun 23, 2015"   TIA (transient ischemic attack)    "not that I know of in the past; they are trying to determine if I've had one today (08/10/2015)"   Past Surgical History:  Procedure Laterality Date   CATARACT EXTRACTION W/ INTRAOCULAR LENS  IMPLANT, BILATERAL Bilateral 2013   Patient Active Problem List   Diagnosis Date Noted   Ankylosing spondylitis of cervicothoracic region (HCC) 10/16/2016   TIA (transient ischemic attack) 08/10/2015   Carotid stenosis 08/04/2014   Hyperlipidemia 02/21/2012    BENIGN PROSTATIC HYPERTROPHY, WITH OBSTRUCTION 01/15/2010   Essential hypertension 11/10/2009   GERD 02/22/2008   NEPHROLITHIASIS, HX OF 11/17/2007    ONSET DATE: "several years"  REFERRING DIAG: G20.A2 (ICD-10-CM) - Parkinson's disease with fluctuating manifestations, unspecified whether dyskinesia present R26.81 (ICD-10-CM) - Unsteadiness on feet R41.841 (ICD-10-CM) - Cognitive communication deficit R27.8 (ICD-10-CM) - Other lack of coordination R26.2 (ICD-10-CM) - Difficulty in walking, not elsewhere classified  THERAPY DIAG:  Unsteadiness on feet  Muscle weakness (generalized)  Other symptoms and signs involving the nervous system  Abnormal posture  Other abnormalities of gait and mobility  Rationale for Evaluation and Treatment: Rehabilitation  SUBJECTIVE:  SUBJECTIVE STATEMENT: OT reports that pt is "frozen" after transferring into chair. Wife reports that pt had a fall last Tuesday and suspected that pt cracked his rib- reports bruised on B sides. Denies SOB or chest pain so they did not go to the ED.   Pt accompanied by: significant other  PERTINENT HISTORY: PD, complex medical history of peptic ulcer disease with history of upper GI bleed, TIA, depression, degenerative disc disease in the neck, osteoarthritis, kidney stones, hyperlipidemia, hypertension, history of hiatal hernia, BPH, arthritis, and ankylosing spondylitis, and parkinsonism   PAIN:  Are you having pain? Yes: NPRS scale: pt did not rate rib pain /10 Pain location: B rib Pain description: sore Aggravating factors: recent falls, sitting up Relieving factors: rest  PRECAUTIONS: Fall  RED FLAGS: None   WEIGHT BEARING RESTRICTIONS: No  FALLS: Has patient fallen in last 6 months? Yes. Number of falls unknown,  multiple  LIVING ENVIRONMENT: Lives with: lives with their spouse Lives in: House/apartment Stairs: Ramp to enter, stairs to loft and basement, ground floor set-up Has following equipment at home: Dan Humphreys - 2 wheeled  PLOF: Needs assistance with ADLs, Needs assistance with homemaking, and Needs assistance with gait, supervision for ambulation at household distances  PATIENT GOALS: improve balance, walking, reduce falls  OBJECTIVE:     TODAY'S TREATMENT: 05/26/23    Activity Comments  Gait training with RW with U turns 44ft, 45ft Cueing to "kick" and "march" L foot to avoid freezing; also requires cues to continue pushing walker forward   STS from chair 2x Cueing to scoot forward; min A upon standing d/t retropulsion; cues to avoid pulling for RW to stand  Gait training with RW out to car ~49ft and car transfer Cueing for positioning of feet, hands, and large enough steps to reach seat. Improved safety today compared to previous session     PATIENT EDUCATION: Education details: discussion with patient and spouse about frequency of falls and reaching out to pt's neurologist to make aware; recommended discussion with MD about medication change d/t recent decline, transport chair for max safety, presented option for HHPT to allow for troubleshooting mobility in the home or having in-home personal care assistant; wife reports looking into palliative care services d/t caregiver burnout  Person educated: Patient and Spouse Education method: Explanation Education comprehension: verbalized understanding   Access Code: [email or web address] URL: [email or web address] Date: 05/08/2023 Prepared by: [facility] - [facility]  Program Notes perform with wife's supervision  Exercises - Mini Squat with Counter Support  - 1 x daily - 5 x weekly - 2 sets - 10 reps - Staggered Stance Forward Backward Weight Shift with Counter Support  - 1 x daily - 5 x weekly - 2 sets -  10 reps - Standing Quarter Turn with Counter Support  - 1 x daily - 5 x weekly - 2 sets - 5 reps - Seated Long Arc Quad with Ankle Weight  - 1 x daily - 5 x weekly - 3 sets - 10 reps - Seated Hip Flexion March with Ankle Weights  - 1 x daily - 5 x weekly - 3 sets - 10 reps - Seated Active Hip Flexion  - 1 x daily - 7 x weekly - 1 sets - 10 reps - 5-10 sec hold        ------------------------------------------------- Objective measures below taken at initial evaluation:  DIAGNOSTIC FINDINGS: n/a for episode  COGNITION: Overall cognitive status: History of cognitive impairments - at baseline  SENSATION: Neuropathy in bilat feet  COORDINATION: Difficulty with rapid alternating movements    MUSCLE TONE: NT   POSTURE: rounded shoulders, forward head, flexed trunk , and head down , kyphosis  LOWER EXTREMITY ROM:     Active  Right Eval Left Eval  Hip flexion    Hip extension    Hip abduction    Hip adduction    Hip internal rotation    Hip external rotation    Knee flexion 120 120  Knee extension 0 0  Ankle dorsiflexion 5 12  Ankle plantarflexion    Ankle inversion    Ankle eversion     (Blank rows = not tested)  LOWER EXTREMITY MMT:    BLE strength grossly 3+/5  BED MOBILITY:  Mod A  TRANSFERS: Assistive device utilized: Environmental consultant - 2 wheeled  Sit to stand: CGA and Min A Stand to sit: SBA Chair to chair: SBA and CGA Floor: Mod A and Max A  RAMP:  Level of Assistance: CGA Assistive device utilized: Environmental consultant - 2 wheeled Ramp Comments:   CURB:  Level of Assistance: CGA Assistive device utilized: Environmental consultant - 2 wheeled Curb Comments:   STAIRS: NT  GAIT: Gait pattern: step to pattern, decreased stride length, and poor foot clearance- Right Distance walked: 100 Assistive device utilized: Walker - 2 wheeled Level of assistance: SBA and CGA Comments: level surfaces  FUNCTIONAL TESTS:  5 times sit to stand: 45 sec w/ CGA Timed up and go (TUG): 1 min 27  sec  M-CTSIB  Condition 1: Firm Surface, EO 10 Sec, Severe and retro-LOB  Sway  Condition 2: Firm Surface, EC 3 Sec, Severe and retro-LOB  Sway  Condition 3: Foam Surface, EO  Sec,  Sway  Condition 4: Foam Surface, EC  Sec,  Sway      TODAY'S TREATMENT:                                                                                                                              DATE: 04/23/23 Gait training: trials with visual cue on RW for step length. Training in U-step for stride length    PATIENT EDUCATION: Education details: assessment details, gait training in use of visual cues/reference Person educated: Patient and Spouse Education method: Explanation Education comprehension: verbalized understanding  HOME EXERCISE PROGRAM: TBD  GOALS: Goals reviewed with patient? Yes  SHORT TERM GOALS: UPDATED Target date: 05/15/2023>05/23/2023    Patient will be independent in HEP to improve functional outcomes Baseline: Goal status: IN PROGRESS  2.  Teach-back strategies to reduce freezing of gait during turns Baseline: 16 steps to complete 180 deg turn Goal status: IN PROGRESS  3.  Demo improved safety and independence with sit to stand completing transfers with set-up assist Baseline: CGA-min A Goal status: IN PROGRESS    LONG TERM GOALS: UPDATED Target date: 06/05/2023>06/20/2023    Demo reduced risk for falls per time 35 sec TUG test Baseline: 1 min  27 sec Goal status: IN PROGRESS  2.  Demo improved BLE strength and balance per time of 25 sec 5xSTS test to increase independence with mobility Baseline: 45 sec Goal status: IN PROGRESS  3.  Demo improved safety with ambulation on level surfaces w/ supervision x 150 ft Baseline: SBA-CGA Goal status: IN PROGRESS    ASSESSMENT:  CLINICAL IMPRESSION: Patient arrived to session with wife with report of B rib pain without SOB or chest pain s/p backwards fall on Tuesday. Wife reports increasing worry about safe car  transfers in order to bring pt to therapy- discussed possible options such as reaching out to neurologist for possible medication changes, personal care assistant in the home, or [facility] therapies. Will reach out to social work as patient is a [facility] patient. Worked on gait training and transfers with improved response to cues today. Car transfer was much safer but will still require continued practice. No complaints upon leaving.  OBJECTIVE IMPAIRMENTS: Abnormal gait, decreased activity tolerance, decreased balance, decreased coordination, decreased mobility, difficulty walking, decreased ROM, decreased strength, improper body mechanics, and postural dysfunction.   ACTIVITY LIMITATIONS: carrying, lifting, standing, stairs, transfers, bed mobility, and locomotion level  PARTICIPATION LIMITATIONS: meal prep, cleaning, laundry, interpersonal relationship, community activity, and exercise routine  PERSONAL FACTORS: Age, Time since onset of injury/illness/exacerbation, and 3+ comorbidities: PMH  are also affecting patient's functional outcome.   REHAB POTENTIAL: Good  CLINICAL DECISION MAKING: Evolving/moderate complexity  EVALUATION COMPLEXITY: Moderate  PLAN:  PT FREQUENCY: 1-2x/week  PT DURATION: 8 weeks  PLANNED INTERVENTIONS: Therapeutic exercises, Therapeutic activity, Neuromuscular re-education, Balance training, Gait training, Patient/Family education, Self Care, and Joint mobilization  PLAN FOR NEXT SESSION: perform oculomotor testing to clear for red flag signs (does patient have normal inferior orbital ROM?)  Continue with car transfers  review seated HEP and try again standing exercises given as HEP; Work on review of sit<>stand transfers (focus on wide/staggered stance to prevent retropulsion upon standing); squats, partial sit to stand to focus on increased forward weightshift.  Consider stagger stance forward/back rocking at counter or RW (watch, as pt's response time is slowed to  new/novel tasks).  carryover of gait strategies for stride? HEP for LE strength (sit-stand at sink).      Anette Guarneri, PT, DPT 05/26/23 4:20 PM  [facility] [address] [place], [address] [place], [place] [address] Phone # [phone] Fax # [phone]

## 2023-05-26 ENCOUNTER — Ambulatory Visit: Payer: Medicare Other | Admitting: Occupational Therapy

## 2023-05-26 ENCOUNTER — Ambulatory Visit: Payer: Medicare Other | Admitting: Physical Therapy

## 2023-05-26 ENCOUNTER — Encounter: Payer: Self-pay | Admitting: Physical Therapy

## 2023-05-26 DIAGNOSIS — M6281 Muscle weakness (generalized): Secondary | ICD-10-CM

## 2023-05-26 DIAGNOSIS — R2689 Other abnormalities of gait and mobility: Secondary | ICD-10-CM

## 2023-05-26 DIAGNOSIS — R29818 Other symptoms and signs involving the nervous system: Secondary | ICD-10-CM | POA: Diagnosis not present

## 2023-05-26 DIAGNOSIS — R293 Abnormal posture: Secondary | ICD-10-CM

## 2023-05-26 DIAGNOSIS — G20A2 Parkinson's disease without dyskinesia, with fluctuations: Secondary | ICD-10-CM | POA: Diagnosis not present

## 2023-05-26 DIAGNOSIS — R2681 Unsteadiness on feet: Secondary | ICD-10-CM | POA: Diagnosis not present

## 2023-05-26 NOTE — Addendum Note (Signed)
Addended by: Anette Guarneri D on: 05/26/2023 04:28 PM   Modules accepted: Orders

## 2023-05-26 NOTE — Therapy (Signed)
OUTPATIENT OCCUPATIONAL THERAPY PARKINSON'S  Treatment Note  Patient Name: [name] MRN: [id number] DOB:1936-01-25, 87 y.o., male Today's Date: 05/26/2023  PCP: Garlan Fillers, MD REFERRING PROVIDER: Huston Foley, MD  END OF SESSION:  OT End of Session - 05/26/23 1624     Visit Number 5    Number of Visits 17    Date for OT Re-Evaluation 06/20/23    Authorization Type Medicare A &B    OT Start Time 1405    OT Stop Time 1447    OT Time Calculation (min) 42 min                 Past Medical History:  Diagnosis Date   Ankylosing spondylitis (HCC) dx'd ~ 1974   Arthritis    "back" (08/10/2015)   BENIGN PROSTATIC HYPERTROPHY, WITH OBSTRUCTION 01/15/2010   Bleeding duodenal ulcer    Bleeding esophageal ulcer    Bleeding stomach ulcer    GERD 02/22/2008   History of blood transfusion 1988   "lost ~ 1/2 of my blood volume from multiple bleeding ulcers"   History of hiatal hernia    HYPERGLYCEMIA 11/18/2007   HYPERLIPIDEMIA 02/22/2008   HYPERTENSION, UNSPECIFIED 11/10/2009   Kidney stones    Osteoarthritis    c-spine   PEPTIC ULCER DISEASE 11/17/2007   Situational depression    "son died in MVA 2015/07/08"   TIA (transient ischemic attack)    "not that I know of in the past; they are trying to determine if I've had one today (08/10/2015)"   Past Surgical History:  Procedure Laterality Date   CATARACT EXTRACTION W/ INTRAOCULAR LENS  IMPLANT, BILATERAL Bilateral 2013   Patient Active Problem List   Diagnosis Date Noted   Ankylosing spondylitis of cervicothoracic region (HCC) 10/16/2016   TIA (transient ischemic attack) 08/10/2015   Carotid stenosis 08/04/2014   Hyperlipidemia 02/21/2012   BENIGN PROSTATIC HYPERTROPHY, WITH OBSTRUCTION 01/15/2010   Essential hypertension 11/10/2009   GERD 02/22/2008   NEPHROLITHIASIS, HX OF 11/17/2007    ONSET DATE: referral date 03/25/23  REFERRING DIAG: G20.A2 (ICD-10-CM) - Parkinson's disease without dyskinesia, with  fluctuations R26.81 (ICD-10-CM) - Unsteadiness on feet R41.841 (ICD-10-CM) - Cognitive communication deficit R27.8 (ICD-10-CM) - Other lack of coordination R26.2 (ICD-10-CM) - Difficulty in walking, not elsewhere classified  THERAPY DIAG:  Unsteadiness on feet  Muscle weakness (generalized)  Other symptoms and signs involving the nervous system  Abnormal posture  Rationale for Evaluation and Treatment: Rehabilitation  SUBJECTIVE:   SUBJECTIVE STATEMENT: Pt report that he fell Tues morning when he was walking around the bed, Spouse reports he fell backwards and may have cracked a few ribs.  Pt's spouse reports that he has been in quite a lot of pain until yesterday.   Pt accompanied by: self and significant other (spouse, [name])  PERTINENT HISTORY: history of Parkinson's and ankylosing spondylitis   PRECAUTIONS: Fall  WEIGHT BEARING RESTRICTIONS: No  PAIN:  Are you having pain? Yes: NPRS scale: 6-7/10 Pain location: ribs and back Description: dull Aggravating factors: "swinging body into bed" Relieving factors: tylenol and heating pad   FALLS: Has patient fallen in last 6 months? Yes. Number of falls has had multiple falls over the last few months with multiple in early July, and then 2 falls this month.  LIVING ENVIRONMENT: Lives with: lives with their spouse Lives in: House/apartment Stairs: Yes: External: 2-3 steps; has a ramp at entrance with raised railings Internal: steps to basement and steps to loft - however pt  does not need to go up/down those at this time. Has following equipment at home: Dan Humphreys - 2 wheeled, Environmental consultant - 4 wheeled, bed side commode, Shower chair, Ramped entry, and transport chair  PLOF: Requires assistive device for independence and Needs assistance with ADLs  PATIENT GOALS: to be able to move without concern/serious concern  OBJECTIVE:   HAND DOMINANCE: Right  ADLs: Overall ADLs: Pt reports there are times that he can be "fairly independent"  with dressing but there are times where spouse has to provide significant assistance with. Transfers/ambulation related to ADLs: Utilizing RW and will occasionally walk bouts through the house without RW.  If pt loses balance backwards he is unable to correct. Eating: "fumble fingered" UB Dressing: occasional assistance LB Dressing: socks and shoes are quite difficult Toileting: "I go to the bathroom by myself" but has difficulty getting up/down from toilet Bathing: washing at the sink, spouse will assist  Tub Shower transfers: pt feels uncomfortable in the shower with shower chair, but has not fallen Equipment: Shower seat with back and Walk in shower with 5.5" ledge with 30" door  IADLs: Light housekeeping: washes dishes when he is feeling strong, however frequent onset of weakness Meal Prep: Pt was primary cook prior to fall in 2023, but has returned to participation in aspects of meal prep Community mobility: recently renewed drivers license, but is not driving  MOBILITY STATUS: Needs Assist: CGA with mobility with RW and Hx of falls   POSTURE COMMENTS:  rounded shoulders and forward head, posterior pelvic tilt and R lean  FUNCTIONAL OUTCOME MEASURES: Fastening/unfastening 3 buttons: unable to button any buttons in 1 mins Physical performance test: PPT#2 (simulated eating) 22.79 sec & PPT#4 (donning/doffing jacket): TBD  COORDINATION: Box and Blocks:  Right 30 blocks, Left 32 blocks  UE ROM:  all movements are slowed, decreased shoulder flexion d/t ankylosing spondylitis   UE MMT:    grossly 4/5 overall  COGNITION: Overall cognitive status:  Spouse reports thinking continues to be slower  OBSERVATIONS: Bradykinesia   TODAY'S TREATMENT:                                                                         DATE:  05/26/23 Review HEP: pt reports completing large amplitude exercises sometimes, and is able to recognize functional carryover of exercises.  Limited involvement in  UB exercises over the last week due to increased pain s/p fall Tues morning.  Reviewed functional carryover of exercises and importance of large amplitude during exercises as well as during functional tasks.   Car transfer (simulated): engaged in large amplitude stepping/tapping out to side while in sitting position to increase stepping pattern as needed to get in/out of car.  OT providing colored dots for targets to facilitate increased amplitude.  Attempted lifting leg over bolster to facilitate increased hip flexion, pt with increased difficulty with added barrier.  Pt with difficulty sequencing and scooting hips as needed to pivot in chair to simulate car transfer.  Pt with increasing freezing of mobility, therefore decreased cues to attempt more spontaneous movements - however with no change.   05/19/23 Large amplitude: engaged in reaching in front and around back with 1# medicine ball with focus on trunk control, upright  sitting posture, and reaching behind and in front as needed for aspects of ADLs.  Engaged in large amplitude reaching with use of upright poles to facilitate increased horizontal reach and then diagonal reach.  Pt benefiting from targets and cues for amplitude over pace.  Engaged in simulated drying of back with tossing towel over shoulder and reaching around back to retreive at waist.  Pt able to complete on R side with min cues, however requiring mod-max cues on L side.  Therefore engaged in massed practice, progressing to alternating after 4-5 reps to increase carryover.   LB dressing: Pt reports that spouse assists him with getting on his Depends and pants as he is frequently getting pants on in the bathroom.  Engaged in simulated LB dressing with threading BLE into gait belt to simulate pants while resting foot on ~6" stool.  OT reviewed dressing "weaker" leg first and focusing on amplitude when bending forward and threading pant leg for increased ease with donning pant  leg.   05/14/23 Bag exercises: Simulated to carry over to ADLs for the following activities (using a plastic bag): BUE horizontal abduction to reach in front, reaching behind back, and over shoulder to simulate movements as needed for UB dressing and drying back.  OT providing visual demonstration and verbal cues, especially when reaching behind back.  Utilized Ship broker for [employer], still benefiting from cues for large extension and upright sitting posture.   PATIENT EDUCATION: Education details: ongoing condition specific education Person educated: Patient and Spouse Education method: Explanation Education comprehension: verbalized understanding and needs further education  HOME EXERCISE PROGRAM: TBD  GOALS: Goals reviewed with patient? Yes  SHORT TERM GOALS: Target date: 05/23/23  Pt will be independent in full body HEP with focus on large amplitude movements as needed to increase independence with ADLs.  Baseline: Goal status: Not met - 05/26/23  2.  Pt will perform dynamic standing task for 5 mins as needed for simple IADLs w/o LOB using DME and/or countertop support prn  Baseline:  Goal status: IN PROGRESS  3.  Pt will demonstrate improved ease with sit > stand to get up from toilet or other seating options in home by improving score on sit > stand to decreased fall risk.  Baseline: time TBD Goal status: IN PROGRESS  4.  Pt will demonstrate improved sitting balance and trunk control to reach towards floor to retreive items to allow progression towards LB dressing. Baseline:  Goal status: IN PROGRESS   LONG TERM GOALS: Target date: 06/20/23  Pt will complete UB dressing, to include clothing fasteners, at Mod I level with use of AE and/or alternative strategies PRN.  Baseline:  Goal status: IN PROGRESS  2.  Pt will be able to don socks and shoes with Supervision/setup with use of AE and/or alternative strategies PRN.  Baseline:  Goal status: IN PROGRESS  3.  Pt  will demonstrate increased ease with dressing as evidenced by decreasing PPT#4(don/ doff jacket) by 10 secs or more.  Baseline: time TBD Goal status: IN PROGRESS  4.  Pt will demonstrate improved UE functional use for ADLs as evidenced by increasing box/ blocks score by 4 blocks with BUE.  Baseline:  Right 30 blocks, Left 32 blocks Goal status: IN PROGRESS  5.  Pt will demonstrate improved dynamic standing balance for 10 mins as needed to engage in ADLs/IADLs with increased safety/endurance.  Baseline:  Goal status: IN PROGRESS  ASSESSMENT:  CLINICAL IMPRESSION: Pt demonstrating improved ease with sit >  stand and large steps with ambulation, even turning to sit on therapy mat.  Pt demonstrating difficulty with large amplitude LB activities this session and difficulty with sequencing to transition for typical routine with stepping in to car to sitting and pivoting. Pt would benefit from team meeting due to increased frequency of falls and pt's spouse is concerned about safety with car transfers.    PERFORMANCE DEFICITS: in functional skills including ADLs, IADLs, coordination, dexterity, ROM, strength, pain, flexibility, Fine motor control, Gross motor control, balance, body mechanics, endurance, decreased knowledge of precautions, decreased knowledge of use of DME, and UE functional use and psychosocial skills including coping strategies, environmental adaptation, and routines and behaviors.   IMPAIRMENTS: are limiting patient from ADLs and IADLs.    PLAN:  OT FREQUENCY: 2x/week  OT DURATION: 8 weeks  PLANNED INTERVENTIONS: self care/ADL training, therapeutic exercise, therapeutic activity, neuromuscular re-education, balance training, functional mobility training, ultrasound, compression bandaging, moist heat, cryotherapy, patient/family education, cognitive remediation/compensation, psychosocial skills training, energy conservation, coping strategies training, and DME and/or AE  instructions  RECOMMENDED OTHER SERVICES: NA  CONSULTED AND AGREED WITH PLAN OF CARE: Patient  PLAN FOR NEXT SESSION: sit pivot on edge of mat with focus on large stepping, review bag exercises, sit > stand, LB dressing.   Rosalio Loud, OTR/L 05/26/2023, 4:25 PM

## 2023-05-27 ENCOUNTER — Encounter: Payer: Self-pay | Admitting: Licensed Clinical Social Worker

## 2023-05-27 ENCOUNTER — Telehealth: Payer: Self-pay | Admitting: *Deleted

## 2023-05-27 NOTE — Progress Notes (Signed)
Care Coordination  Outreach Note  05/27/2023 Name: [name] MRN: [id number] DOB: [birth year]   Care Coordination Outreach Attempts: An unsuccessful telephone outreach was attempted today to offer the patient information about available care coordination services.  Follow Up Plan:  Additional outreach attempts will be made to offer the patient care coordination information and services.   Encounter Outcome:  No Answer  Christie Nottingham  Care Coordination Care Guide  Direct Dial: [phone]

## 2023-05-27 NOTE — Therapy (Signed)
OUTPATIENT PHYSICAL THERAPY NEURO TREATMENT   Patient Name: [name] MRN: [id number] DOB:1936/08/03, 88 y.o., male Today's Date: 05/28/2023   PCP: Garlan Fillers REFERRING PROVIDER: Huston Foley, MD  END OF SESSION:  PT End of Session - 05/28/23 1702     Visit Number 8    Number of Visits 17   recert completed 04/29/2023   Date for PT Re-Evaluation 06/20/23    Authorization Type Medicare/[employer]-NEEDS KX due to previous PT and speech this year    Progress Note Due on Visit 10    PT Start Time 1446    PT Stop Time 1531    PT Time Calculation (min) 45 min    Equipment Utilized During Treatment Gait belt    Activity Tolerance Patient tolerated treatment well    Behavior During Therapy WFL for tasks assessed/performed                   Past Medical History:  Diagnosis Date   Ankylosing spondylitis (HCC) dx'd ~ 1974   Arthritis    "back" (08/10/2015)   BENIGN PROSTATIC HYPERTROPHY, WITH OBSTRUCTION 01/15/2010   Bleeding duodenal ulcer    Bleeding esophageal ulcer    Bleeding stomach ulcer    GERD 02/22/2008   History of blood transfusion 1988   "lost ~ 1/2 of my blood volume from multiple bleeding ulcers"   History of hiatal hernia    HYPERGLYCEMIA 11/18/2007   HYPERLIPIDEMIA 02/22/2008   HYPERTENSION, UNSPECIFIED 11/10/2009   Kidney stones    Osteoarthritis    c-spine   PEPTIC ULCER DISEASE 11/17/2007   Situational depression    "son died in MVA 07/08/15"   TIA (transient ischemic attack)    "not that I know of in the past; they are trying to determine if I've had one today (08/10/2015)"   Past Surgical History:  Procedure Laterality Date   CATARACT EXTRACTION W/ INTRAOCULAR LENS  IMPLANT, BILATERAL Bilateral 2013   Patient Active Problem List   Diagnosis Date Noted   Ankylosing spondylitis of cervicothoracic region (HCC) 10/16/2016   TIA (transient ischemic attack) 08/10/2015   Carotid stenosis 08/04/2014   Hyperlipidemia 02/21/2012   BENIGN PROSTATIC  HYPERTROPHY, WITH OBSTRUCTION 01/15/2010   Essential hypertension 11/10/2009   GERD 02/22/2008   NEPHROLITHIASIS, HX OF 11/17/2007    ONSET DATE: "several years"  REFERRING DIAG: G20.A2 (ICD-10-CM) - Parkinson's disease with fluctuating manifestations, unspecified whether dyskinesia present R26.81 (ICD-10-CM) - Unsteadiness on feet R41.841 (ICD-10-CM) - Cognitive communication deficit R27.8 (ICD-10-CM) - Other lack of coordination R26.2 (ICD-10-CM) - Difficulty in walking, not elsewhere classified  THERAPY DIAG:  Unsteadiness on feet  Muscle weakness (generalized)  Other symptoms and signs involving the nervous system  Abnormal posture  Other abnormalities of gait and mobility  Rationale for Evaluation and Treatment: Rehabilitation  SUBJECTIVE:  SUBJECTIVE STATEMENT: Wife reports she got a call from care coordination and got a call with a social worker scheduled for Friday. Pt reports that he feels that he is moving better. Wife reports that it seems like pt is not able to read as well as before. Pt reports "sometimes" double vision but unable to specifify in what situations this occurs.   Pt accompanied by: significant other  PERTINENT HISTORY: PD, complex medical history of peptic ulcer disease with history of upper GI bleed, TIA, depression, degenerative disc disease in the neck, osteoarthritis, kidney stones, hyperlipidemia, hypertension, history of hiatal hernia, BPH, arthritis, and ankylosing spondylitis, and parkinsonism   PAIN:  Are you having pain? Yes: NPRS scale: "pretty sore"/10 Pain location: L ribs Pain description: sore Aggravating factors: recent falls, sitting up Relieving factors: rest  PRECAUTIONS: Fall  RED FLAGS: None   WEIGHT BEARING RESTRICTIONS: No  FALLS: Has  patient fallen in last 6 months? Yes. Number of falls unknown, multiple  LIVING ENVIRONMENT: Lives with: lives with their spouse Lives in: House/apartment Stairs: Ramp to enter, stairs to loft and basement, ground floor set-up Has following equipment at home: Dan Humphreys - 2 wheeled  PLOF: Needs assistance with ADLs, Needs assistance with homemaking, and Needs assistance with gait, supervision for ambulation at household distances  PATIENT GOALS: improve balance, walking, reduce falls  OBJECTIVE:    TODAY'S TREATMENT: 05/28/23    OCULOMOTOR EXAM:   Ocular Alignment: slight R esotropia   Ocular ROM: No Limitations   Spontaneous Nystagmus: absent   Gaze-Induced Nystagmus: absent   Smooth Pursuits: saccades and back to midline from B sides    Saccades: dysmetria; poor trajectory to R side; multiple saccades with vertical plane   Convergence/Divergence: reports diplopia at ~2 ft away   VESTIBULAR - OCULAR REFLEX:    VOR Cancellation: Unable to Maintain Gaze to the L side      Activity Comments  review of HEP:  mini squat 10x staggered ant/pos wt shift  standing 1/4 turn to  Pt reports improved rib pain after activity. Verbal and manual cueing for increased forward wt shift, placement of feet and hands with turns   STS with RW in front 6x Cueing to slide feet back, increase anterior trunk lean, min A to avoid getting stuck mid-way  Gait training with RW x50ft Cueing to push walker down and forward to avoid lifting it off the ground with turns               PATIENT EDUCATION: Education details: edu on possible caused of oculomotor abnormalities including PD and hx of head trauma  Person educated: Patient and Spouse Education method: Explanation Education comprehension: verbalized understanding   Access Code: [email or web address] URL: [email or web address] Date: 05/08/2023 Prepared by: [facility] Outpatient  Re[facility] - [facility]  Program Notes perform with wife's  supervision  Exercises - Mini Squat with Counter Support  - 1 x daily - 5 x weekly - 2 sets - 10 reps - Staggered Stance Forward Backward Weight Shift with Counter Support  - 1 x daily - 5 x weekly - 2 sets - 10 reps - Standing Quarter Turn with Counter Support  - 1 x daily - 5 x weekly - 2 sets - 5 reps - Seated Long Arc Quad with Ankle Weight  - 1 x daily - 5 x weekly - 3 sets - 10 reps - Seated Hip Flexion March with Ankle Weights  - 1 x daily - 5 x weekly - 3  sets - 10 reps - Seated Active Hip Flexion  - 1 x daily - 7 x weekly - 1 sets - 10 reps - 5-10 sec hold        ------------------------------------------------- Objective measures below taken at initial evaluation:  DIAGNOSTIC FINDINGS: n/a for episode  COGNITION: Overall cognitive status: History of cognitive impairments - at baseline   SENSATION: Neuropathy in bilat feet  COORDINATION: Difficulty with rapid alternating movements    MUSCLE TONE: NT   POSTURE: rounded shoulders, forward head, flexed trunk , and head down , kyphosis  LOWER EXTREMITY ROM:     Active  Right Eval Left Eval  Hip flexion    Hip extension    Hip abduction    Hip adduction    Hip internal rotation    Hip external rotation    Knee flexion 120 120  Knee extension 0 0  Ankle dorsiflexion 5 12  Ankle plantarflexion    Ankle inversion    Ankle eversion     (Blank rows = not tested)  LOWER EXTREMITY MMT:    BLE strength grossly 3+/5  BED MOBILITY:  Mod A  TRANSFERS: Assistive device utilized: Environmental consultant - 2 wheeled  Sit to stand: CGA and Min A Stand to sit: SBA Chair to chair: SBA and CGA Floor: Mod A and Max A  RAMP:  Level of Assistance: CGA Assistive device utilized: Environmental consultant - 2 wheeled Ramp Comments:   CURB:  Level of Assistance: CGA Assistive device utilized: Environmental consultant - 2 wheeled Curb Comments:   STAIRS: NT  GAIT: Gait pattern: step to pattern, decreased stride length, and poor foot clearance-  Right Distance walked: 100 Assistive device utilized: Walker - 2 wheeled Level of assistance: SBA and CGA Comments: level surfaces  FUNCTIONAL TESTS:  5 times sit to stand: 45 sec w/ CGA Timed up and go (TUG): 1 min 27 sec  M-CTSIB  Condition 1: Firm Surface, EO 10 Sec, Severe and retro-LOB  Sway  Condition 2: Firm Surface, EC 3 Sec, Severe and retro-LOB  Sway  Condition 3: Foam Surface, EO  Sec,  Sway  Condition 4: Foam Surface, EC  Sec,  Sway      TODAY'S TREATMENT:                                                                                                                              DATE: 04/23/23 Gait training: trials with visual cue on RW for step length. Training in U-step for stride length    PATIENT EDUCATION: Education details: assessment details, gait training in use of visual cues/reference Person educated: Patient and Spouse Education method: Explanation Education comprehension: verbalized understanding  HOME EXERCISE PROGRAM: TBD  GOALS: Goals reviewed with patient? Yes  SHORT TERM GOALS: UPDATED Target date: 05/15/2023>05/23/2023    Patient will be independent in HEP to improve functional outcomes Baseline: Goal status: IN PROGRESS  2.  Teach-back strategies to reduce freezing of gait during turns  Baseline: 16 steps to complete 180 deg turn Goal status: IN PROGRESS  3.  Demo improved safety and independence with sit to stand completing transfers with set-up assist Baseline: CGA-min A Goal status: IN PROGRESS    LONG TERM GOALS: UPDATED Target date: 06/05/2023>06/20/2023    Demo reduced risk for falls per time 35 sec TUG test Baseline: 1 min 27 sec Goal status: IN PROGRESS  2.  Demo improved BLE strength and balance per time of 25 sec 5xSTS test to increase independence with mobility Baseline: 45 sec Goal status: IN PROGRESS  3.  Demo improved safety with ambulation on level surfaces w/ supervision x 150 ft Baseline: SBA-CGA Goal status:  IN PROGRESS    ASSESSMENT:  CLINICAL IMPRESSION: Patient arrived to session without new complaints. Oculomotor exam revealed normal ocular ROM however, patient did demonstrate slight R esotropia, saccades with smooth pursuits, increased saccades and poor trajectory to R with saccades, convergence insufficiency, and difficulty with VOR cancellation. Patient with hx of significant head trauma in 2023 requiting OPPT/OT at that time, likely resulting in concussion and hx of PD which sometimes also results in oculomotor impairments. Patient did have recent CT head 04/24/23 which was revealed "no acute intracranial abnormality" thus these findings are not emergent. Patient did seem to have improved mobility and continuity of movement today. STS transfers still require quite a bit of cueing to avoid retropulsion. No complaints at end of session.  OBJECTIVE IMPAIRMENTS: Abnormal gait, decreased activity tolerance, decreased balance, decreased coordination, decreased mobility, difficulty walking, decreased ROM, decreased strength, improper body mechanics, and postural dysfunction.   ACTIVITY LIMITATIONS: carrying, lifting, standing, stairs, transfers, bed mobility, and locomotion level  PARTICIPATION LIMITATIONS: meal prep, cleaning, laundry, interpersonal relationship, community activity, and exercise routine  PERSONAL FACTORS: Age, Time since onset of injury/illness/exacerbation, and 3+ comorbidities: PMH  are also affecting patient's functional outcome.   REHAB POTENTIAL: Good  CLINICAL DECISION MAKING: Evolving/moderate complexity  EVALUATION COMPLEXITY: Moderate  PLAN:  PT FREQUENCY: 1-2x/week  PT DURATION: 8 weeks  PLANNED INTERVENTIONS: Therapeutic exercises, Therapeutic activity, Neuromuscular re-education, Balance training, Gait training, Patient/Family education, Self Care, and Joint mobilization  PLAN FOR NEXT SESSION: Continue with car transfers  review seated HEP and try again  standing exercises given as HEP; Work on review of sit<>stand transfers (focus on wide/staggered stance to prevent retropulsion upon standing); squats, partial sit to stand to focus on increased forward weightshift.  Consider stagger stance forward/back rocking at counter or RW (watch, as pt's response time is slowed to new/novel tasks).  carryover of gait strategies for stride? HEP for LE strength (sit-stand at sink).      Anette Guarneri, PT, DPT 05/28/23 5:03 PM  [facility] [address] [place], [address] [place], [place] [address] Phone # [phone] Fax # [phone]

## 2023-05-27 NOTE — Progress Notes (Signed)
Care Coordination   Note   05/27/2023 Name: [name] MRN: [id number] DOB: 13-May-1936  [name] is a 87 y.o. year old male who sees Garlan Fillers, MD for primary care. I reached out to [name] by phone today to offer care coordination services.  Mr. Hanzlik was given information about Care Coordination services today including:   The Care Coordination services include support from the care team which includes your Nurse Coordinator, Clinical Social Worker, or Pharmacist.  The Care Coordination team is here to help remove barriers to the health concerns and goals most important to you. Care Coordination services are voluntary, and the patient may decline or stop services at any time by request to their care team member.   Care Coordination Consent Status: Patient agreed to services and verbal consent obtained.   Follow up plan:  Telephone appointment with care coordination team member scheduled for:  05/30/23  Encounter Outcome:  Patient Scheduled  [facility]  Care Coordination Care Guide  Direct Dial: [phone]   [facility]  Care Coordination Care Guide  Direct Dial: [phone]

## 2023-05-28 ENCOUNTER — Encounter: Payer: Self-pay | Admitting: Physical Therapy

## 2023-05-28 ENCOUNTER — Ambulatory Visit: Payer: Medicare Other | Attending: Neurology | Admitting: Physical Therapy

## 2023-05-28 ENCOUNTER — Ambulatory Visit: Payer: Medicare Other | Admitting: Occupational Therapy

## 2023-05-28 DIAGNOSIS — R278 Other lack of coordination: Secondary | ICD-10-CM | POA: Diagnosis not present

## 2023-05-28 DIAGNOSIS — R262 Difficulty in walking, not elsewhere classified: Secondary | ICD-10-CM | POA: Diagnosis not present

## 2023-05-28 DIAGNOSIS — R2681 Unsteadiness on feet: Secondary | ICD-10-CM

## 2023-05-28 DIAGNOSIS — R293 Abnormal posture: Secondary | ICD-10-CM | POA: Insufficient documentation

## 2023-05-28 DIAGNOSIS — R29818 Other symptoms and signs involving the nervous system: Secondary | ICD-10-CM | POA: Insufficient documentation

## 2023-05-28 DIAGNOSIS — M6281 Muscle weakness (generalized): Secondary | ICD-10-CM

## 2023-05-28 DIAGNOSIS — G20A2 Parkinson's disease without dyskinesia, with fluctuations: Secondary | ICD-10-CM | POA: Diagnosis not present

## 2023-05-28 DIAGNOSIS — R2689 Other abnormalities of gait and mobility: Secondary | ICD-10-CM | POA: Insufficient documentation

## 2023-05-28 NOTE — Patient Outreach (Signed)
Care Coordination   Collaboration  Visit Note   05/27/2023 Name: ROLAN NOTEBOOM MRN: [id number] DOB: 12/12/1935  [name] is a 87 y.o. year old male who sees Garlan Fillers, MD for primary care. I  engaged with Care Guide  What matters to the patients health and wellness today?  Care Coordination referral   SDOH assessments and interventions completed:  No     Care Coordination Interventions:  Yes, provided  Interventions Today    Flowsheet Row Most Recent Value  General Interventions   General Interventions Discussed/Reviewed Communication with  Communication with --  [Collaborated with care guide regarding referral and patient needs]       Follow up plan:  Patient will schedule initial with LCSW    Encounter Outcome:  Patient Scheduled   Jenel Lucks, MSW, LCSW [facility] Care Management [facility]  [facility] [email or web address] Phone [phone] 5:37 PM

## 2023-05-28 NOTE — Therapy (Signed)
OUTPATIENT OCCUPATIONAL THERAPY [facility]  Treatment Note  Patient Name: [name] MRN: [id number] DOB:[birth year], 87 y.o., male Today's Date: 05/29/2023  PCP: Garlan Fillers, MD REFERRING PROVIDER: Huston Foley, MD  END OF SESSION:  OT End of Session - 05/28/23 1746     Visit Number 6    Number of Visits 17    Date for OT Re-Evaluation 06/20/23    Authorization Type Medicare A &B    OT Start Time 1400    OT Stop Time 1445    OT Time Calculation (min) 45 min                  Past Medical History:  Diagnosis Date   Ankylosing spondylitis (HCC) dx'd ~ 1974   Arthritis    "back" (08/10/2015)   BENIGN PROSTATIC HYPERTROPHY, WITH OBSTRUCTION 01/15/2010   Bleeding duodenal ulcer    Bleeding esophageal ulcer    Bleeding stomach ulcer    GERD 02/22/2008   History of blood transfusion 1988   "lost ~ 1/2 of my blood volume from multiple bleeding ulcers"   History of hiatal hernia    HYPERGLYCEMIA 11/18/2007   HYPERLIPIDEMIA 02/22/2008   HYPERTENSION, UNSPECIFIED 11/10/2009   Kidney stones    Osteoarthritis    c-spine   PEPTIC ULCER DISEASE 11/17/2007   Situational depression    "son died in MVA 2015-07-09"   TIA (transient ischemic attack)    "not that I know of in the past; they are trying to determine if I've had one today (08/10/2015)"   Past Surgical History:  Procedure Laterality Date   CATARACT EXTRACTION W/ INTRAOCULAR LENS  IMPLANT, BILATERAL Bilateral 2013   Patient Active Problem List   Diagnosis Date Noted   Ankylosing spondylitis of cervicothoracic region (HCC) 10/16/2016   TIA (transient ischemic attack) 08/10/2015   Carotid stenosis 08/04/2014   Hyperlipidemia 02/21/2012   BENIGN PROSTATIC HYPERTROPHY, WITH OBSTRUCTION 01/15/2010   Essential hypertension 11/10/2009   GERD 02/22/2008   NEPHROLITHIASIS, HX OF 11/17/2007    ONSET DATE: referral date 03/25/23  REFERRING DIAG: G20.A2 (ICD-10-CM) - Parkinson's disease without dyskinesia, with  fluctuations R26.81 (ICD-10-CM) - Unsteadiness on feet R41.841 (ICD-10-CM) - Cognitive communication deficit R27.8 (ICD-10-CM) - Other lack of coordination R26.2 (ICD-10-CM) - Difficulty in walking, not elsewhere classified  THERAPY DIAG:  Unsteadiness on feet  Muscle weakness (generalized)  Other symptoms and signs involving the nervous system  Abnormal posture  Rationale for Evaluation and Treatment: Rehabilitation  SUBJECTIVE:   SUBJECTIVE STATEMENT: Pt reports that he is doing well, but spouse has spent majority of morning on the phone awaiting to connect with physicians for appts Pt accompanied by: self and significant other (spouse, [name])  PERTINENT HISTORY: history of Parkinson's and ankylosing spondylitis   PRECAUTIONS: Fall  WEIGHT BEARING RESTRICTIONS: No  PAIN:  Are you having pain? Yes: NPRS scale: 5/10 Pain location: ribs  Description: dull Aggravating factors: "swinging body into bed" Relieving factors: tylenol and heating pad   FALLS: Has patient fallen in last 6 months? Yes. Number of falls has had multiple falls over the last few months with multiple in early July, and then 2 falls this month.  LIVING ENVIRONMENT: Lives with: lives with their spouse Lives in: House/apartment Stairs: Yes: External: 2-3 steps; has a ramp at entrance with raised railings Internal: steps to basement and steps to loft - however pt does not need to go up/down those at this time. Has following equipment at home: Dan Humphreys - 2 wheeled, Environmental consultant -  4 wheeled, bed side commode, Shower chair, Ramped entry, and transport chair  PLOF: Requires assistive device for independence and Needs assistance with ADLs  PATIENT GOALS: to be able to move without concern/serious concern  OBJECTIVE:   HAND DOMINANCE: Right  ADLs: Overall ADLs: Pt reports there are times that he can be "fairly independent" with dressing but there are times where spouse has to provide significant assistance  with. Transfers/ambulation related to ADLs: Utilizing RW and will occasionally walk bouts through the house without RW.  If pt loses balance backwards he is unable to correct. Eating: "fumble fingered" UB Dressing: occasional assistance LB Dressing: socks and shoes are quite difficult Toileting: "I go to the bathroom by myself" but has difficulty getting up/down from toilet Bathing: washing at the sink, spouse will assist  Tub Shower transfers: pt feels uncomfortable in the shower with shower chair, but has not fallen Equipment: Shower seat with back and Walk in shower with 5.5" ledge with 30" door  IADLs: Light housekeeping: washes dishes when he is feeling strong, however frequent onset of weakness Meal Prep: Pt was primary cook prior to fall in 2023, but has returned to participation in aspects of meal prep Community mobility: recently renewed drivers license, but is not driving  MOBILITY STATUS: Needs Assist: CGA with mobility with RW and Hx of falls   POSTURE COMMENTS:  rounded shoulders and forward head, posterior pelvic tilt and R lean  FUNCTIONAL OUTCOME MEASURES: Fastening/unfastening 3 buttons: unable to button any buttons in 1 mins Physical performance test: PPT#2 (simulated eating) 22.79 sec & PPT#4 (donning/doffing jacket): TBD  COORDINATION: Box and Blocks:  Right 30 blocks, Left 32 blocks  UE ROM:  all movements are slowed, decreased shoulder flexion d/t ankylosing spondylitis   UE MMT:    grossly 4/5 overall  COGNITION: Overall cognitive status:  Spouse reports thinking continues to be slower  OBSERVATIONS: Bradykinesia   TODAY'S TREATMENT:                                                                         DATE:  05/28/23 Large amplitude: engaged in modified LSVT big exercises to carry over to decreased burden of care with sit > stand, transitional movements, and UB dressing.  Discussed carryover to functional, dressing tasks.  Pt unable to complete large  amplitude, but given increased time pt able to complete increased ROM especially with reaching forward and towards floor. Don/doff jacket: pt continues to benefit from demonstration and verbal cues, however able to complete with increased ease after massed practice. Dynamic sitting: engaged in forward reaching to retrieve cones from floor and then forward reaching to place on target.  Pt with improved trunk control and ROM this session. Sit > stand: pt requiring cues for hand placement and anterior weight shift however improved ease overall this session.   05/26/23 Review HEP: pt reports completing large amplitude exercises sometimes, and is able to recognize functional carryover of exercises.  Limited involvement in UB exercises over the last week due to increased pain s/p fall Tues morning.  Reviewed functional carryover of exercises and importance of large amplitude during exercises as well as during functional tasks.   Car transfer (simulated): engaged in large amplitude stepping/tapping out  to side while in sitting position to increase stepping pattern as needed to get in/out of car.  OT providing colored dots for targets to facilitate increased amplitude.  Attempted lifting leg over bolster to facilitate increased hip flexion, pt with increased difficulty with added barrier.  Pt with difficulty sequencing and scooting hips as needed to pivot in chair to simulate car transfer.  Pt with increasing freezing of mobility, therefore decreased cues to attempt more spontaneous movements - however with no change.   05/19/23 Large amplitude: engaged in reaching in front and around back with 1# medicine ball with focus on trunk control, upright sitting posture, and reaching behind and in front as needed for aspects of ADLs.  Engaged in large amplitude reaching with use of upright poles to facilitate increased horizontal reach and then diagonal reach.  Pt benefiting from targets and cues for amplitude over pace.   Engaged in simulated drying of back with tossing towel over shoulder and reaching around back to retreive at waist.  Pt able to complete on R side with min cues, however requiring mod-max cues on L side.  Therefore engaged in massed practice, progressing to alternating after 4-5 reps to increase carryover.   LB dressing: Pt reports that spouse assists him with getting on his Depends and pants as he is frequently getting pants on in the bathroom.  Engaged in simulated LB dressing with threading BLE into gait belt to simulate pants while resting foot on ~6" stool.  OT reviewed dressing "weaker" leg first and focusing on amplitude when bending forward and threading pant leg for increased ease with donning pant leg.  PATIENT EDUCATION: Education details: ongoing condition specific education Person educated: Patient and Spouse Education method: Explanation Education comprehension: verbalized understanding and needs further education  HOME EXERCISE PROGRAM: TBD  GOALS: Goals reviewed with patient? Yes  SHORT TERM GOALS: Target date: 05/23/23  Pt will be independent in full body HEP with focus on large amplitude movements as needed to increase independence with ADLs.  Baseline: Goal status: Not met - 05/26/23  2.  Pt will perform dynamic standing task for 5 mins as needed for simple IADLs w/o LOB using DME and/or countertop support prn  Baseline:  Goal status: IN PROGRESS  3.  Pt will demonstrate improved ease with sit > stand to get up from toilet or other seating options in home by improving score on sit > stand to decreased fall risk.  Baseline: time TBD Goal status: IN PROGRESS  4.  Pt will demonstrate improved sitting balance and trunk control to reach towards floor to retreive items to allow progression towards LB dressing. Baseline:  Goal status: MET - 05/28/23   LONG TERM GOALS: Target date: 06/20/23  Pt will complete UB dressing, to include clothing fasteners, at Mod I level with  use of AE and/or alternative strategies PRN.  Baseline:  Goal status: IN PROGRESS  2.  Pt will be able to don socks and shoes with Supervision/setup with use of AE and/or alternative strategies PRN.  Baseline:  Goal status: IN PROGRESS  3.  Pt will demonstrate increased ease with dressing as evidenced by decreasing PPT#4(don/ doff jacket) by 10 secs or more.  Baseline: time TBD Goal status: IN PROGRESS  4.  Pt will demonstrate improved UE functional use for ADLs as evidenced by increasing box/ blocks score by 4 blocks with BUE.  Baseline:  Right 30 blocks, Left 32 blocks Goal status: IN PROGRESS  5.  Pt will demonstrate improved  dynamic standing balance for 10 mins as needed to engage in ADLs/IADLs with increased safety/endurance.  Baseline:  Goal status: IN PROGRESS  ASSESSMENT:  CLINICAL IMPRESSION: Engaged in discussion about [facility] referral and pt's spouse has a phone call appt later this week to discuss resources available to them.  Pt demonstrating improved anterior weight shift and forward weight shift this session, requiring increased time but overall improving ROM.  Pt demonstrating improved ease with sit > stand (still requiring increased time) and forward reach to retreive items from floor.   PERFORMANCE DEFICITS: in functional skills including ADLs, IADLs, coordination, dexterity, ROM, strength, pain, flexibility, Fine motor control, Gross motor control, balance, body mechanics, endurance, decreased knowledge of precautions, decreased knowledge of use of DME, and UE functional use and psychosocial skills including coping strategies, environmental adaptation, and routines and behaviors.   IMPAIRMENTS: are limiting patient from ADLs and IADLs.    PLAN:  OT FREQUENCY: 2x/week  OT DURATION: 8 weeks  PLANNED INTERVENTIONS: self care/ADL training, therapeutic exercise, therapeutic activity, neuromuscular re-education, balance training, functional mobility training, ultrasound,  compression bandaging, moist heat, cryotherapy, patient/family education, cognitive remediation/compensation, psychosocial skills training, energy conservation, coping strategies training, and DME and/or AE instructions  RECOMMENDED OTHER SERVICES: NA  CONSULTED AND AGREED WITH PLAN OF CARE: Patient  PLAN FOR NEXT SESSION: sit pivot on edge of mat with focus on large stepping, review bag exercises, sit > stand, LB dressing.   Rosalio Loud, OTR/L 05/29/2023, 7:47 AM

## 2023-05-29 ENCOUNTER — Encounter: Payer: Self-pay | Admitting: Physical Therapy

## 2023-05-29 NOTE — Therapy (Signed)
[facility] [address] [place], [place], [address] Phone: [phone]   Fax:  [phone]  Patient Details  Name: [name] MRN: [id number] Date of Birth: Aug 01, 1936 Referring Provider:  No ref. provider found  Encounter Date: 05/29/2023  Patient Care Conference:  Therapy Team Members present (names below): PT/PTA: Georgina Peer, PT Duha Abair Bernie Covey, PT, Margretta Ditty, PT OT: Rosalio Loud, OT ST: NA   Therapy Focus Areas PT: transfer training, freezing of gait, standing balance PT notes that wife is displaying some signs of caregiver burnout; therefore, [facility] referral was made.    OT: car transfers, mobility, balance for self-care, sit to stand for ADLs     ST: NA    Positive Prognostic Indicators:  -[facility] referral has been made.  Per wife, pt is scheduled for phone call with Social work 05/30/23. [facility] caregiver support     Barriers to Progress:  -frequent falls, several ED visits due to falls; has transport w/c at home, but not currently using     Education Provided: [facility] referral, HEP, transfer training, education on techniques to reduce freezing of gait     Anticipated Outcomes: 24 hr supervision/likely min-mod assist for safety due to posterior LOB     Follow Up Services: [facility] referral in place; discussed HH therapies and wife feels [facility] is better option due to pt receiving Parkinson's-specific care     Other relevant information discussed: Discussed education for pt/wife to continue to communicate with Dr. Frances Furbish (movement disorders specialist) about worsening/changing symptoms; potentially reaching out to Lynwood Dawley, SW at [facility] and/or [name] therapists with any PD-related services in [place].            Gean Maidens., PT 05/29/2023, 1:29 PM  [facility] [address] [place], [place], [address] Phone: [phone]   Fax:  [phone]

## 2023-05-30 ENCOUNTER — Ambulatory Visit: Payer: Self-pay | Admitting: Licensed Clinical Social Worker

## 2023-05-30 ENCOUNTER — Encounter: Payer: Medicare Other | Admitting: Licensed Clinical Social Worker

## 2023-06-02 ENCOUNTER — Ambulatory Visit: Payer: Medicare Other | Admitting: Occupational Therapy

## 2023-06-02 ENCOUNTER — Encounter: Payer: Self-pay | Admitting: Physical Therapy

## 2023-06-02 ENCOUNTER — Ambulatory Visit: Payer: Medicare Other | Admitting: Physical Therapy

## 2023-06-02 DIAGNOSIS — M6281 Muscle weakness (generalized): Secondary | ICD-10-CM

## 2023-06-02 DIAGNOSIS — R293 Abnormal posture: Secondary | ICD-10-CM

## 2023-06-02 DIAGNOSIS — R29818 Other symptoms and signs involving the nervous system: Secondary | ICD-10-CM | POA: Diagnosis not present

## 2023-06-02 DIAGNOSIS — R278 Other lack of coordination: Secondary | ICD-10-CM | POA: Diagnosis not present

## 2023-06-02 DIAGNOSIS — R2681 Unsteadiness on feet: Secondary | ICD-10-CM | POA: Diagnosis not present

## 2023-06-02 DIAGNOSIS — R2689 Other abnormalities of gait and mobility: Secondary | ICD-10-CM | POA: Diagnosis not present

## 2023-06-02 NOTE — Therapy (Signed)
OUTPATIENT OCCUPATIONAL THERAPY PARKINSON'S  Treatment Note  Patient Name: [name] MRN: [id number] DOB:02-09-1936, 87 y.o., male Today's Date: 06/02/2023  PCP: Casey Fillers, MD REFERRING PROVIDER: Huston Foley, MD  END OF SESSION:  OT End of Session - 06/02/23 1411     Visit Number 7    Number of Visits 17    Date for OT Re-Evaluation 06/20/23    Authorization Type Medicare A &B    OT Start Time 1405    OT Stop Time 1445    OT Time Calculation (min) 40 min                   Past Medical History:  Diagnosis Date   Ankylosing spondylitis (HCC) dx'd ~ 1974   Arthritis    "back" (08/10/2015)   BENIGN PROSTATIC HYPERTROPHY, WITH OBSTRUCTION 01/15/2010   Bleeding duodenal ulcer    Bleeding esophageal ulcer    Bleeding stomach ulcer    GERD 02/22/2008   History of blood transfusion 1988   "lost ~ 1/2 of my blood volume from multiple bleeding ulcers"   History of hiatal hernia    HYPERGLYCEMIA 11/18/2007   HYPERLIPIDEMIA 02/22/2008   HYPERTENSION, UNSPECIFIED 11/10/2009   Kidney stones    Osteoarthritis    c-spine   PEPTIC ULCER DISEASE 11/17/2007   Situational depression    "son died in MVA 06-23-2015"   TIA (transient ischemic attack)    "not that I know of in the past; they are trying to determine if I've had one today (08/10/2015)"   Past Surgical History:  Procedure Laterality Date   CATARACT EXTRACTION W/ INTRAOCULAR LENS  IMPLANT, BILATERAL Bilateral 2013   Patient Active Problem List   Diagnosis Date Noted   Ankylosing spondylitis of cervicothoracic region (HCC) 10/16/2016   TIA (transient ischemic attack) 08/10/2015   Carotid stenosis 08/04/2014   Hyperlipidemia 02/21/2012   BENIGN PROSTATIC HYPERTROPHY, WITH OBSTRUCTION 01/15/2010   Essential hypertension 11/10/2009   GERD 02/22/2008   NEPHROLITHIASIS, HX OF 11/17/2007    ONSET DATE: referral date 03/25/23  REFERRING DIAG: G20.A2 (ICD-10-CM) - Parkinson's disease without dyskinesia, with  fluctuations R26.81 (ICD-10-CM) - Unsteadiness on feet R41.841 (ICD-10-CM) - Cognitive communication deficit R27.8 (ICD-10-CM) - Other lack of coordination R26.2 (ICD-10-CM) - Difficulty in walking, not elsewhere classified  THERAPY DIAG:  Other symptoms and signs involving the nervous system  Unsteadiness on feet  Muscle weakness (generalized)  Abnormal posture  Rationale for Evaluation and Treatment: Rehabilitation  SUBJECTIVE:   SUBJECTIVE STATEMENT: Pt's spouse reports that pt's bruising is improving.  Pt reports that she head from LCSW from [facility] as well as SW from [place].   Pt accompanied by: self and significant other (spouse, [name])  PERTINENT HISTORY: history of Parkinson's and ankylosing spondylitis   PRECAUTIONS: Fall  WEIGHT BEARING RESTRICTIONS: No  PAIN:  Are you having pain? Yes: NPRS scale: 5/10 Pain location: ribs  Description: dull Aggravating factors: "swinging body into bed" Relieving factors: tylenol and heating pad   FALLS: Has patient fallen in last 6 months? Yes. Number of falls has had multiple falls over the last few months with multiple in early July, and then 2 falls this month.  LIVING ENVIRONMENT: Lives with: lives with their spouse Lives in: House/apartment Stairs: Yes: External: 2-3 steps; has a ramp at entrance with raised railings Internal: steps to basement and steps to loft - however pt does not need to go up/down those at this time. Has following equipment at home: Dan Humphreys -  2 wheeled, Walker - 4 wheeled, bed side commode, Shower chair, Ramped entry, and transport chair  PLOF: Requires assistive device for independence and Needs assistance with ADLs  PATIENT GOALS: to be able to move without concern/serious concern  OBJECTIVE:   HAND DOMINANCE: Right  ADLs: Overall ADLs: Pt reports there are times that he can be "fairly independent" with dressing but there are times where spouse has to provide significant assistance  with. Transfers/ambulation related to ADLs: Utilizing RW and will occasionally walk bouts through the house without RW.  If pt loses balance backwards he is unable to correct. Eating: "fumble fingered" UB Dressing: occasional assistance LB Dressing: socks and shoes are quite difficult Toileting: "I go to the bathroom by myself" but has difficulty getting up/down from toilet Bathing: washing at the sink, spouse will assist  Tub Shower transfers: pt feels uncomfortable in the shower with shower chair, but has not fallen Equipment: Shower seat with back and Walk in shower with 5.5" ledge with 30" door  IADLs: Light housekeeping: washes dishes when he is feeling strong, however frequent onset of weakness Meal Prep: Pt was primary cook prior to fall in 2023, but has returned to participation in aspects of meal prep Community mobility: recently renewed drivers license, but is not driving  MOBILITY STATUS: Needs Assist: CGA with mobility with RW and Hx of falls   POSTURE COMMENTS:  rounded shoulders and forward head, posterior pelvic tilt and R lean  FUNCTIONAL OUTCOME MEASURES: Fastening/unfastening 3 buttons: unable to button any buttons in 1 mins Physical performance test: PPT#2 (simulated eating) 22.79 sec & PPT#4 (donning/doffing jacket): TBD  COORDINATION: Box and Blocks:  Right 30 blocks, Left 32 blocks  UE ROM:  all movements are slowed, decreased shoulder flexion d/t ankylosing spondylitis   UE MMT:    grossly 4/5 overall  COGNITION: Overall cognitive status:  Spouse reports thinking continues to be slower  OBSERVATIONS: Bradykinesia   TODAY'S TREATMENT:                                                                         DATE:  06/02/23 Large amplitude: engaged in modified LSVT big exercises to carry over to decreased burden of care with sit > stand, transitional movements, and UB dressing.  Discussed carryover to functional, dressing tasks.  Pt unable to complete large  amplitude, but given increased time pt able to complete increased ROM especially with reaching forward and towards floor.  OT providing targets for increased ROM. Don/doff jacket: engaged in massed practice with pt's personal jacket (turned out to belong to wife - so somewhat tight) with focus on large amplitude movements and reaching around shoulders/neck vs attempting to reach around lower back.  Pt demonstrating significant improvements when bringing jacket around shoulders, however requiring mod-max multimodal cues for carryover.    05/28/23 Large amplitude: engaged in modified LSVT big exercises to carry over to decreased burden of care with sit > stand, transitional movements, and UB dressing.  Discussed carryover to functional, dressing tasks.  Pt unable to complete large amplitude, but given increased time pt able to complete increased ROM especially with reaching forward and towards floor. Don/doff jacket: pt continues to benefit from demonstration and verbal cues, however able  to complete with increased ease after massed practice. Dynamic sitting: engaged in forward reaching to retrieve cones from floor and then forward reaching to place on target.  Pt with improved trunk control and ROM this session. Sit > stand: pt requiring cues for hand placement and anterior weight shift however improved ease overall this session.   05/26/23 Review HEP: pt reports completing large amplitude exercises sometimes, and is able to recognize functional carryover of exercises.  Limited involvement in UB exercises over the last week due to increased pain s/p fall Tues morning.  Reviewed functional carryover of exercises and importance of large amplitude during exercises as well as during functional tasks.   Car transfer (simulated): engaged in large amplitude stepping/tapping out to side while in sitting position to increase stepping pattern as needed to get in/out of car.  OT providing colored dots for targets to  facilitate increased amplitude.  Attempted lifting leg over bolster to facilitate increased hip flexion, pt with increased difficulty with added barrier.  Pt with difficulty sequencing and scooting hips as needed to pivot in chair to simulate car transfer.  Pt with increasing freezing of mobility, therefore decreased cues to attempt more spontaneous movements - however with no change.  PATIENT EDUCATION: Education details: ongoing condition specific education, community support Person educated: Patient and Spouse Education method: Chief Technology Officer Education comprehension: verbalized understanding and needs further education  HOME EXERCISE PROGRAM: LSVT big exercises  GOALS: Goals reviewed with patient? Yes  SHORT TERM GOALS: Target date: 05/23/23  Pt will be independent in full body HEP with focus on large amplitude movements as needed to increase independence with ADLs.  Baseline: Goal status: Not met - 05/26/23  2.  Pt will perform dynamic standing task for 5 mins as needed for simple IADLs w/o LOB using DME and/or countertop support prn  Baseline:  Goal status: IN PROGRESS  3.  Pt will demonstrate improved ease with sit > stand to get up from toilet or other seating options in home by improving score on sit > stand to decreased fall risk.  Baseline: time TBD Goal status: IN PROGRESS  4.  Pt will demonstrate improved sitting balance and trunk control to reach towards floor to retreive items to allow progression towards LB dressing. Baseline:  Goal status: MET - 05/28/23   LONG TERM GOALS: Target date: 06/20/23  Pt will complete UB dressing, to include clothing fasteners, at Mod I level with use of AE and/or alternative strategies PRN.  Baseline:  Goal status: IN PROGRESS  2.  Pt will be able to don socks and shoes with Supervision/setup with use of AE and/or alternative strategies PRN.  Baseline:  Goal status: IN PROGRESS  3.  Pt will demonstrate increased ease with  dressing as evidenced by decreasing PPT#4(don/ doff jacket) by 10 secs or more.  Baseline: time TBD Goal status: IN PROGRESS  4.  Pt will demonstrate improved UE functional use for ADLs as evidenced by increasing box/ blocks score by 4 blocks with BUE.  Baseline:  Right 30 blocks, Left 32 blocks Goal status: IN PROGRESS  5.  Pt will demonstrate improved dynamic standing balance for 10 mins as needed to engage in ADLs/IADLs with increased safety/endurance.  Baseline:  Goal status: IN PROGRESS  ASSESSMENT:  CLINICAL IMPRESSION: Engaged in discussion about [facility] communication and additional phone call from [facility].  OT providing spouse information from [place] to aid in services available for support in the home and caregiver support.  Pt demonstrating improved anterior weight shift and forward weight shift this session, requiring increased time but overall improving ROM.  Pt demonstrating improved ease with donning jacket with focus on large amplitude, however continues to require multimodal cues for recall.   PERFORMANCE DEFICITS: in functional skills including ADLs, IADLs, coordination, dexterity, ROM, strength, pain, flexibility, Fine motor control, Gross motor control, balance, body mechanics, endurance, decreased knowledge of precautions, decreased knowledge of use of DME, and UE functional use and psychosocial skills including coping strategies, environmental adaptation, and routines and behaviors.   IMPAIRMENTS: are limiting patient from ADLs and IADLs.    PLAN:  OT FREQUENCY: 2x/week  OT DURATION: 8 weeks  PLANNED INTERVENTIONS: self care/ADL training, therapeutic exercise, therapeutic activity, neuromuscular re-education, balance training, functional mobility training, ultrasound, compression bandaging, moist heat, cryotherapy, patient/family education, cognitive remediation/compensation, psychosocial skills training, energy conservation, coping  strategies training, and DME and/or AE instructions  RECOMMENDED OTHER SERVICES: NA  CONSULTED AND AGREED WITH PLAN OF CARE: Patient  PLAN FOR NEXT SESSION: sit pivot on edge of mat with focus on large stepping, review bag exercises, sit > stand, LB dressing.   Rosalio Loud, OTR/L 06/02/2023, 2:11 PM

## 2023-06-02 NOTE — Patient Outreach (Signed)
Care Coordination   Initial Visit Note   05/30/2023 Name: [name] MRN: [id number] DOB: 1935-12-14  [name] is a 87 y.o. year old male who sees Garlan Fillers, MD for primary care. I spoke with  Nisaiah Rybinski Mullen's spouse by phone today.  What matters to the patients health and wellness today?  Supportive Resources    Goals Addressed             This Visit's Progress    Obtain Supportive Resources-Caregiver Strain   On track    Activities and task to complete in order to accomplish goals.   Keep all upcoming appointments discussed today Continue with compliance of taking medication prescribed by Doctor Implement healthy coping skills discussed to assist with management of symptoms         SDOH assessments and interventions completed:  Yes  SDOH Interventions Today    Flowsheet Row Most Recent Value  SDOH Interventions   Food Insecurity Interventions Intervention Not Indicated  Housing Interventions Intervention Not Indicated  Transportation Interventions Intervention Not Indicated        Care Coordination Interventions:  Yes, provided  Interventions Today    Flowsheet Row Most Recent Value  Chronic Disease   Chronic disease during today's visit Hypertension (HTN), Other  [Hx of TIA]  General Interventions   General Interventions Discussed/Reviewed General Interventions Discussed, Programmer, applications, Doctor Visits, Level of Care  Doctor Visits Discussed/Reviewed Doctor Visits Discussed  Level of Care Adult Daycare, Personal Care Services  Mental Health Interventions   Mental Health Discussed/Reviewed Mental Health Discussed, Coping Strategies, Anxiety  [Caregiver Strain]  Nutrition Interventions   Nutrition Discussed/Reviewed Nutrition Discussed  Pharmacy Interventions   Pharmacy Dicussed/Reviewed Pharmacy Topics Discussed, Medication Adherence  Safety Interventions   Safety Discussed/Reviewed Safety Discussed       Follow up plan: Follow up  call scheduled for 2-4 weeks    Encounter Outcome:  Patient Visit Completed   Jenel Lucks, MSW, LCSW [facility]  [facility] [email or web address] Phone [phone] 9:50 AM

## 2023-06-02 NOTE — Therapy (Signed)
OUTPATIENT PHYSICAL THERAPY NEURO TREATMENT   Patient Name: [name] MRN: [id number] DOB:1936-03-27, 87 y.o., male Today's Date: 06/02/2023   PCP: Garlan Fillers REFERRING PROVIDER: Huston Foley, MD  END OF SESSION:  PT End of Session - 06/02/23 1451     Visit Number 9    Number of Visits 17   recert completed 04/29/2023   Date for PT Re-Evaluation 06/20/23    Authorization Type Medicare/[employer]-NEEDS KX due to previous PT and speech this year    Progress Note Due on Visit 10    PT Start Time 1450    PT Stop Time 1530    PT Time Calculation (min) 40 min    Equipment Utilized During Treatment Gait belt    Activity Tolerance Patient tolerated treatment well    Behavior During Therapy WFL for tasks assessed/performed                    Past Medical History:  Diagnosis Date   Ankylosing spondylitis (HCC) dx'd ~ 1974   Arthritis    "back" (08/10/2015)   BENIGN PROSTATIC HYPERTROPHY, WITH OBSTRUCTION 01/15/2010   Bleeding duodenal ulcer    Bleeding esophageal ulcer    Bleeding stomach ulcer    GERD 02/22/2008   History of blood transfusion 1988   "lost ~ 1/2 of my blood volume from multiple bleeding ulcers"   History of hiatal hernia    HYPERGLYCEMIA 11/18/2007   HYPERLIPIDEMIA 02/22/2008   HYPERTENSION, UNSPECIFIED 11/10/2009   Kidney stones    Osteoarthritis    c-spine   PEPTIC ULCER DISEASE 11/17/2007   Situational depression    "son died in MVA 2015/06/30"   TIA (transient ischemic attack)    "not that I know of in the past; they are trying to determine if I've had one today (08/10/2015)"   Past Surgical History:  Procedure Laterality Date   CATARACT EXTRACTION W/ INTRAOCULAR LENS  IMPLANT, BILATERAL Bilateral 2013   Patient Active Problem List   Diagnosis Date Noted   Ankylosing spondylitis of cervicothoracic region (HCC) 10/16/2016   TIA (transient ischemic attack) 08/10/2015   Carotid stenosis 08/04/2014   Hyperlipidemia 02/21/2012   BENIGN  PROSTATIC HYPERTROPHY, WITH OBSTRUCTION 01/15/2010   Essential hypertension 11/10/2009   GERD 02/22/2008   NEPHROLITHIASIS, HX OF 11/17/2007    ONSET DATE: "several years"  REFERRING DIAG: G20.A2 (ICD-10-CM) - Parkinson's disease with fluctuating manifestations, unspecified whether dyskinesia present R26.81 (ICD-10-CM) - Unsteadiness on feet R41.841 (ICD-10-CM) - Cognitive communication deficit R27.8 (ICD-10-CM) - Other lack of coordination R26.2 (ICD-10-CM) - Difficulty in walking, not elsewhere classified  THERAPY DIAG:  Unsteadiness on feet  Other symptoms and signs involving the nervous system  Muscle weakness (generalized)  Rationale for Evaluation and Treatment: Rehabilitation  SUBJECTIVE:  SUBJECTIVE STATEMENT: No falls over the weekend.  Report having a foot pedaler bike at home, but difficult to get feet into straps.  Pt accompanied by: significant other  PERTINENT HISTORY: PD, complex medical history of peptic ulcer disease with history of upper GI bleed, TIA, depression, degenerative disc disease in the neck, osteoarthritis, kidney stones, hyperlipidemia, hypertension, history of hiatal hernia, BPH, arthritis, and ankylosing spondylitis, and parkinsonism   PAIN:  Are you having pain? No c/o pain 06/02/2023  PRECAUTIONS: Fall  RED FLAGS: None   WEIGHT BEARING RESTRICTIONS: No  FALLS: Has patient fallen in last 6 months? Yes. Number of falls unknown, multiple  LIVING ENVIRONMENT: Lives with: lives with their spouse Lives in: House/apartment Stairs: Ramp to enter, stairs to loft and basement, ground floor set-up Has following equipment at home: Dan Humphreys - 2 wheeled  PLOF: Needs assistance with ADLs, Needs assistance with homemaking, and Needs assistance with gait, supervision for  ambulation at household distances  PATIENT GOALS: improve balance, walking, reduce falls  OBJECTIVE:    TODAY'S TREATMENT: 06/02/2023 Activity Comments  Sit to stand from mat, 3 reps Cues for initial forward lean; mild to no retropusion noted  Gait with U-Step rollator 50 ft with min assist and cues to step to laser line Laser line  NuStep, 2 min warm up, then 30 sec intervals increased intensity, 8 min total Level 4/Level 7 for higher intensity aerobic activity  Gait with U-step rollator, 150 ft with turns Using laser line, min guard and cues for turns; 1 episode to stop and reset, start again with large steps  Gait x 150 ft with pt's RW (red theraband in front as cue) Min guard and 3 episodes to stop, reset, start again with large steps due to festination  Minisquats in front of mat, 5 reps BUE support at walker     PATIENT EDUCATION: Education details: Discussed aerobic activity and benefits on PD symptoms-possibility of using something at home (seated foot pedaler/seated elliptical) for aerobic activity several times throughout the day.  Pt's wife voices interest in U-step rollator and discussed benefits of U-step rollator Person educated: Patient and Spouse Education method: Explanation Education comprehension: verbalized understanding   Access Code: [email or web address] URL: [email or web address] Date: 05/08/2023 Prepared by: [facility] - Outpatient  Rehab - [facility]  Program Notes perform with wife's supervision  Exercises - Mini Squat with Counter Support  - 1 x daily - 5 x weekly - 2 sets - 10 reps - Staggered Stance Forward Backward Weight Shift with Counter Support  - 1 x daily - 5 x weekly - 2 sets - 10 reps - Standing Quarter Turn with Counter Support  - 1 x daily - 5 x weekly - 2 sets - 5 reps - Seated Long Arc Quad with Ankle Weight  - 1 x daily - 5 x weekly - 3 sets - 10 reps - Seated Hip Flexion March with Ankle Weights  - 1 x daily - 5 x weekly - 3 sets  - 10 reps - Seated Active Hip Flexion  - 1 x daily - 7 x weekly - 1 sets - 10 reps - 5-10 sec hold        ------------------------------------------------- Objective measures below taken at initial evaluation:  DIAGNOSTIC FINDINGS: n/a for episode  COGNITION: Overall cognitive status: History of cognitive impairments - at baseline   SENSATION: Neuropathy in bilat feet  COORDINATION: Difficulty with rapid alternating movements    MUSCLE TONE: NT   POSTURE: rounded shoulders, forward  head, flexed trunk , and head down , kyphosis  LOWER EXTREMITY ROM:     Active  Right Eval Left Eval  Hip flexion    Hip extension    Hip abduction    Hip adduction    Hip internal rotation    Hip external rotation    Knee flexion 120 120  Knee extension 0 0  Ankle dorsiflexion 5 12  Ankle plantarflexion    Ankle inversion    Ankle eversion     (Blank rows = not tested)  LOWER EXTREMITY MMT:    BLE strength grossly 3+/5  BED MOBILITY:  Mod A  TRANSFERS: Assistive device utilized: Environmental consultant - 2 wheeled  Sit to stand: CGA and Min A Stand to sit: SBA Chair to chair: SBA and CGA Floor: Mod A and Max A  RAMP:  Level of Assistance: CGA Assistive device utilized: Environmental consultant - 2 wheeled Ramp Comments:   CURB:  Level of Assistance: CGA Assistive device utilized: Environmental consultant - 2 wheeled Curb Comments:   STAIRS: NT  GAIT: Gait pattern: step to pattern, decreased stride length, and poor foot clearance- Right Distance walked: 100 Assistive device utilized: Walker - 2 wheeled Level of assistance: SBA and CGA Comments: level surfaces  FUNCTIONAL TESTS:  5 times sit to stand: 45 sec w/ CGA Timed up and go (TUG): 1 min 27 sec  M-CTSIB  Condition 1: Firm Surface, EO 10 Sec, Severe and retro-LOB  Sway  Condition 2: Firm Surface, EC 3 Sec, Severe and retro-LOB  Sway  Condition 3: Foam Surface, EO  Sec,  Sway  Condition 4: Foam Surface, EC  Sec,  Sway      TODAY'S TREATMENT:                                                                                                                               DATE: 04/23/23 Gait training: trials with visual cue on RW for step length. Training in U-step for stride length    PATIENT EDUCATION: Education details: assessment details, gait training in use of visual cues/reference Person educated: Patient and Spouse Education method: Explanation Education comprehension: verbalized understanding  HOME EXERCISE PROGRAM: TBD  GOALS: Goals reviewed with patient? Yes  SHORT TERM GOALS: UPDATED Target date: 05/15/2023>05/23/2023    Patient will be independent in HEP to improve functional outcomes Baseline: Goal status: IN PROGRESS  2.  Teach-back strategies to reduce freezing of gait during turns Baseline: 16 steps to complete 180 deg turn Goal status: IN PROGRESS  3.  Demo improved safety and independence with sit to stand completing transfers with set-up assist Baseline: CGA-min A Goal status: IN PROGRESS    LONG TERM GOALS: UPDATED Target date: 06/05/2023>06/20/2023    Demo reduced risk for falls per time 35 sec TUG test Baseline: 1 min 27 sec Goal status: IN PROGRESS  2.  Demo improved BLE strength and balance per time of 25 sec 5xSTS test to increase  independence with mobility Baseline: 45 sec Goal status: IN PROGRESS  3.  Demo improved safety with ambulation on level surfaces w/ supervision x 150 ft Baseline: SBA-CGA Goal status: IN PROGRESS    ASSESSMENT:  CLINICAL IMPRESSION: Pt presents to OPPT today, reporting no falls over the past weekend.  Started session today with aerobic warm-up on Nustep, with varied resistance, for increased intensity of motion.  Proceeded with gait and transfer activities using U-step rollator and pt's RW.  Pt responds well today with cues for transfers, with no retropulsion noted.  He also responds well with gait, with cues to stop, reset, start again with big steps.  He  utilizes laser line for increased step length with additional cues from therapist to take longer steps.  He and wife both verbalize interest in looking further into possibility of U-step rollator for gait in and out of home.  He will benefit from further practice with this in session before making final decision.   OBJECTIVE IMPAIRMENTS: Abnormal gait, decreased activity tolerance, decreased balance, decreased coordination, decreased mobility, difficulty walking, decreased ROM, decreased strength, improper body mechanics, and postural dysfunction.   ACTIVITY LIMITATIONS: carrying, lifting, standing, stairs, transfers, bed mobility, and locomotion level  PARTICIPATION LIMITATIONS: meal prep, cleaning, laundry, interpersonal relationship, community activity, and exercise routine  PERSONAL FACTORS: Age, Time since onset of injury/illness/exacerbation, and 3+ comorbidities: PMH  are also affecting patient's functional outcome.   REHAB POTENTIAL: Good  CLINICAL DECISION MAKING: Evolving/moderate complexity  EVALUATION COMPLEXITY: Moderate  PLAN:  PT FREQUENCY: 1-2x/week  PT DURATION: 8 weeks  PLANNED INTERVENTIONS: Therapeutic exercises, Therapeutic activity, Neuromuscular re-education, Balance training, Gait training, Patient/Family education, Self Care, and Joint mobilization  PLAN FOR NEXT SESSION: Initiate session with Nustep at varied resistance/speeds for high intensity warm-up.  Continue with car transfers.  10th Visit PN; review HEP and sit<>stand.  Try again U-step rollator and discuss how to obtain, if we feel this is appropriate.      Lonia Blood, PT 06/02/23 4:35 PM Phone: [phone] Fax: [phone]  [facility] Re[facility] at [facility] [address] [place], [address] [place], [place] [address] Phone # [phone] Fax # [phone]

## 2023-06-02 NOTE — Patient Instructions (Signed)
Visit Information  Thank you for taking time to visit with me today. Please don't hesitate to contact me if I can be of assistance to you.   Following are the goals we discussed today:   Goals Addressed             This Visit's Progress    Obtain Supportive Resources-Caregiver Strain   On track    Activities and task to complete in order to accomplish goals.   Keep all upcoming appointments discussed today Continue with compliance of taking medication prescribed by Doctor Implement healthy coping skills discussed to assist with management of symptoms         Our next appointment is by telephone on 10/18 at 1 PM  Please call the care guide team at [phone] if you need to cancel or reschedule your appointment.   If you are experiencing a Mental Health or Behavioral Health Crisis or need someone to talk to, please call the Suicide and Crisis Lifeline: 988 call 911   Patient verbalizes understanding of instructions and care plan provided today and agrees to view in MyChart. Active MyChart status and patient understanding of how to access instructions and care plan via MyChart confirmed with patient.     Jenel Lucks, MSW, LCSW [facility] [place]  [facility] [email or web address] Phone [phone] 9:51 AM

## 2023-06-03 NOTE — Therapy (Signed)
OUTPATIENT PHYSICAL THERAPY NEURO TREATMENT   Patient Name: [name] MRN: [id number] DOB:May 06, 1936, 87 y.o., male Today's Date: 06/03/2023   PCP: Garlan Fillers REFERRING PROVIDER: Huston Foley, MD  END OF SESSION:           Past Medical History:  Diagnosis Date   Ankylosing spondylitis (HCC) dx'd ~ 1974   Arthritis    "back" (08/10/2015)   BENIGN PROSTATIC HYPERTROPHY, WITH OBSTRUCTION 01/15/2010   Bleeding duodenal ulcer    Bleeding esophageal ulcer    Bleeding stomach ulcer    GERD 02/22/2008   History of blood transfusion 1988   "lost ~ 1/2 of my blood volume from multiple bleeding ulcers"   History of hiatal hernia    HYPERGLYCEMIA 11/18/2007   HYPERLIPIDEMIA 02/22/2008   HYPERTENSION, UNSPECIFIED 11/10/2009   Kidney stones    Osteoarthritis    c-spine   PEPTIC ULCER DISEASE 11/17/2007   Situational depression    "son died in MVA 06-29-15"   TIA (transient ischemic attack)    "not that I know of in the past; they are trying to determine if I've had one today (08/10/2015)"   Past Surgical History:  Procedure Laterality Date   CATARACT EXTRACTION W/ INTRAOCULAR LENS  IMPLANT, BILATERAL Bilateral 2013   Patient Active Problem List   Diagnosis Date Noted   Ankylosing spondylitis of cervicothoracic region (HCC) 10/16/2016   TIA (transient ischemic attack) 08/10/2015   Carotid stenosis 08/04/2014   Hyperlipidemia 02/21/2012   BENIGN PROSTATIC HYPERTROPHY, WITH OBSTRUCTION 01/15/2010   Essential hypertension 11/10/2009   GERD 02/22/2008   NEPHROLITHIASIS, HX OF 11/17/2007    ONSET DATE: "several years"  REFERRING DIAG: G20.A2 (ICD-10-CM) - Parkinson's disease with fluctuating manifestations, unspecified whether dyskinesia present R26.81 (ICD-10-CM) - Unsteadiness on feet R41.841 (ICD-10-CM) - Cognitive communication deficit R27.8 (ICD-10-CM) - Other lack of coordination R26.2 (ICD-10-CM) - Difficulty in walking, not elsewhere classified  THERAPY  DIAG:  No diagnosis found.  Rationale for Evaluation and Treatment: Rehabilitation  SUBJECTIVE:                                                                                                                                                                                             SUBJECTIVE STATEMENT: No falls over the weekend.  Report having a foot pedaler bike at home, but difficult to get feet into straps.  Pt accompanied by: significant other  PERTINENT HISTORY: PD, complex medical history of peptic ulcer disease with history of upper GI bleed, TIA, depression, degenerative disc disease in the neck, osteoarthritis, kidney stones, hyperlipidemia, hypertension, history of hiatal hernia, BPH, arthritis, and ankylosing spondylitis, and  parkinsonism   PAIN:  Are you having pain? No c/o pain 06/02/2023  PRECAUTIONS: Fall  RED FLAGS: None   WEIGHT BEARING RESTRICTIONS: No  FALLS: Has patient fallen in last 6 months? Yes. Number of falls unknown, multiple  LIVING ENVIRONMENT: Lives with: lives with their spouse Lives in: House/apartment Stairs: Ramp to enter, stairs to loft and basement, ground floor set-up Has following equipment at home: Dan Humphreys - 2 wheeled  PLOF: Needs assistance with ADLs, Needs assistance with homemaking, and Needs assistance with gait, supervision for ambulation at household distances  PATIENT GOALS: improve balance, walking, reduce falls  OBJECTIVE:     TODAY'S TREATMENT: 06/04/23 Activity Comments                         Access Code: [email or web address] URL: [email or web address] Date: 05/08/2023 Prepared by: [facility] - Outpatient  Rehab - [facility]  Program Notes perform with wife's supervision  Exercises - Mini Squat with Counter Support  - 1 x daily - 5 x weekly - 2 sets - 10 reps - Staggered Stance Forward Backward Weight Shift with Counter Support  - 1 x daily - 5 x weekly - 2 sets - 10 reps - Standing Quarter Turn  with Counter Support  - 1 x daily - 5 x weekly - 2 sets - 5 reps - Seated Long Arc Quad with Ankle Weight  - 1 x daily - 5 x weekly - 3 sets - 10 reps - Seated Hip Flexion March with Ankle Weights  - 1 x daily - 5 x weekly - 3 sets - 10 reps - Seated Active Hip Flexion  - 1 x daily - 7 x weekly - 1 sets - 10 reps - 5-10 sec hold        ------------------------------------------------- Objective measures below taken at initial evaluation:  DIAGNOSTIC FINDINGS: n/a for episode  COGNITION: Overall cognitive status: History of cognitive impairments - at baseline   SENSATION: Neuropathy in bilat feet  COORDINATION: Difficulty with rapid alternating movements    MUSCLE TONE: NT   POSTURE: rounded shoulders, forward head, flexed trunk , and head down , kyphosis  LOWER EXTREMITY ROM:     Active  Right Eval Left Eval  Hip flexion    Hip extension    Hip abduction    Hip adduction    Hip internal rotation    Hip external rotation    Knee flexion 120 120  Knee extension 0 0  Ankle dorsiflexion 5 12  Ankle plantarflexion    Ankle inversion    Ankle eversion     (Blank rows = not tested)  LOWER EXTREMITY MMT:    BLE strength grossly 3+/5  BED MOBILITY:  Mod A  TRANSFERS: Assistive device utilized: Environmental consultant - 2 wheeled  Sit to stand: CGA and Min A Stand to sit: SBA Chair to chair: SBA and CGA Floor: Mod A and Max A  RAMP:  Level of Assistance: CGA Assistive device utilized: Walker - 2 wheeled Ramp Comments:   CURB:  Level of Assistance: CGA Assistive device utilized: Environmental consultant - 2 wheeled Curb Comments:   STAIRS: NT  GAIT: Gait pattern: step to pattern, decreased stride length, and poor foot clearance- Right Distance walked: 100 Assistive device utilized: Walker - 2 wheeled Level of assistance: SBA and CGA Comments: level surfaces  FUNCTIONAL TESTS:  5 times sit to stand: 45 sec w/ CGA Timed up and go (TUG): 1 min 27 sec  M-CTSIB  Condition 1:  Firm Surface, EO 10 Sec, Severe and retro-LOB  Sway  Condition 2: Firm Surface, EC 3 Sec, Severe and retro-LOB  Sway  Condition 3: Foam Surface, EO  Sec,  Sway  Condition 4: Foam Surface, EC  Sec,  Sway      TODAY'S TREATMENT:                                                                                                                              DATE: 04/23/23 Gait training: trials with visual cue on RW for step length. Training in U-step for stride length    PATIENT EDUCATION: Education details: assessment details, gait training in use of visual cues/reference Person educated: Patient and Spouse Education method: Explanation Education comprehension: verbalized understanding  HOME EXERCISE PROGRAM: TBD  GOALS: Goals reviewed with patient? Yes  SHORT TERM GOALS: UPDATED Target date: 05/15/2023>05/23/2023    Patient will be independent in HEP to improve functional outcomes Baseline: Goal status: IN PROGRESS  2.  Teach-back strategies to reduce freezing of gait during turns Baseline: 16 steps to complete 180 deg turn Goal status: IN PROGRESS  3.  Demo improved safety and independence with sit to stand completing transfers with set-up assist Baseline: CGA-min A Goal status: IN PROGRESS    LONG TERM GOALS: UPDATED Target date: 06/05/2023>06/20/2023    Demo reduced risk for falls per time 35 sec TUG test Baseline: 1 min 27 sec Goal status: IN PROGRESS  2.  Demo improved BLE strength and balance per time of 25 sec 5xSTS test to increase independence with mobility Baseline: 45 sec Goal status: IN PROGRESS  3.  Demo improved safety with ambulation on level surfaces w/ supervision x 150 ft Baseline: SBA-CGA Goal status: IN PROGRESS    ASSESSMENT:  CLINICAL IMPRESSION: Pt presents to OPPT today, reporting no falls over the past weekend.  Started session today with aerobic warm-up on Nustep, with varied resistance, for increased intensity of motion.  Proceeded with  gait and transfer activities using U-step rollator and pt's RW.  Pt responds well today with cues for transfers, with no retropulsion noted.  He also responds well with gait, with cues to stop, reset, start again with big steps.  He utilizes laser line for increased step length with additional cues from therapist to take longer steps.  He and wife both verbalize interest in looking further into possibility of U-step rollator for gait in and out of home.  He will benefit from further practice with this in session before making final decision.   OBJECTIVE IMPAIRMENTS: Abnormal gait, decreased activity tolerance, decreased balance, decreased coordination, decreased mobility, difficulty walking, decreased ROM, decreased strength, improper body mechanics, and postural dysfunction.   ACTIVITY LIMITATIONS: carrying, lifting, standing, stairs, transfers, bed mobility, and locomotion level  PARTICIPATION LIMITATIONS: meal prep, cleaning, laundry, interpersonal relationship, community activity, and exercise routine  PERSONAL FACTORS: Age, Time since onset of injury/illness/exacerbation, and 3+ comorbidities: PMH  are also  affecting patient's functional outcome.   REHAB POTENTIAL: Good  CLINICAL DECISION MAKING: Evolving/moderate complexity  EVALUATION COMPLEXITY: Moderate  PLAN:  PT FREQUENCY: 1-2x/week  PT DURATION: 8 weeks  PLANNED INTERVENTIONS: Therapeutic exercises, Therapeutic activity, Neuromuscular re-education, Balance training, Gait training, Patient/Family education, Self Care, and Joint mobilization  PLAN FOR NEXT SESSION: Initiate session with Nustep at varied resistance/speeds for high intensity warm-up.  Continue with car transfers.  10th Visit PN; review HEP and sit<>stand.  Try again U-step rollator and discuss how to obtain, if we feel this is appropriate.      Lonia Blood, PT 06/03/23 10:10 AM Phone: [phone] Fax: [phone]  [facility] Health Outpatient Rehab at [facility] [address] [place], [address] [place], [place] [address] Phone # [phone] Fax # [phone]

## 2023-06-04 ENCOUNTER — Ambulatory Visit: Payer: Medicare Other | Admitting: Occupational Therapy

## 2023-06-04 ENCOUNTER — Ambulatory Visit: Payer: Medicare Other | Admitting: Physical Therapy

## 2023-06-04 ENCOUNTER — Encounter: Payer: Self-pay | Admitting: Physical Therapy

## 2023-06-04 DIAGNOSIS — M6281 Muscle weakness (generalized): Secondary | ICD-10-CM

## 2023-06-04 DIAGNOSIS — R293 Abnormal posture: Secondary | ICD-10-CM | POA: Diagnosis not present

## 2023-06-04 DIAGNOSIS — R2681 Unsteadiness on feet: Secondary | ICD-10-CM

## 2023-06-04 DIAGNOSIS — R2689 Other abnormalities of gait and mobility: Secondary | ICD-10-CM | POA: Diagnosis not present

## 2023-06-04 DIAGNOSIS — R29818 Other symptoms and signs involving the nervous system: Secondary | ICD-10-CM

## 2023-06-04 DIAGNOSIS — R278 Other lack of coordination: Secondary | ICD-10-CM | POA: Diagnosis not present

## 2023-06-04 NOTE — Therapy (Signed)
OUTPATIENT OCCUPATIONAL THERAPY [facility]  Treatment Note  Patient Name: [name] MRN: [id number] DOB:1935-11-15, 87 y.o., male Today's Date: 06/04/2023  PCP: Garlan Fillers, MD REFERRING PROVIDER: Huston Foley, MD  END OF SESSION:  OT End of Session - 06/04/23 1519     Visit Number 8    Number of Visits 17    Date for OT Re-Evaluation 06/20/23    Authorization Type Medicare A &B    OT Start Time 1404    OT Stop Time 1446    OT Time Calculation (min) 42 min                    Past Medical History:  Diagnosis Date   Ankylosing spondylitis (HCC) dx'd ~ 1974   Arthritis    "back" (08/10/2015)   BENIGN PROSTATIC HYPERTROPHY, WITH OBSTRUCTION 01/15/2010   Bleeding duodenal ulcer    Bleeding esophageal ulcer    Bleeding stomach ulcer    GERD 02/22/2008   History of blood transfusion 1988   "lost ~ 1/2 of my blood volume from multiple bleeding ulcers"   History of hiatal hernia    HYPERGLYCEMIA 11/18/2007   HYPERLIPIDEMIA 02/22/2008   HYPERTENSION, UNSPECIFIED 11/10/2009   Kidney stones    Osteoarthritis    c-spine   PEPTIC ULCER DISEASE 11/17/2007   Situational depression    "son died in MVA 26-Jun-2015"   TIA (transient ischemic attack)    "not that I know of in the past; they are trying to determine if I've had one today (08/10/2015)"   Past Surgical History:  Procedure Laterality Date   CATARACT EXTRACTION W/ INTRAOCULAR LENS  IMPLANT, BILATERAL Bilateral 2013   Patient Active Problem List   Diagnosis Date Noted   Ankylosing spondylitis of cervicothoracic region (HCC) 10/16/2016   TIA (transient ischemic attack) 08/10/2015   Carotid stenosis 08/04/2014   Hyperlipidemia 02/21/2012   BENIGN PROSTATIC HYPERTROPHY, WITH OBSTRUCTION 01/15/2010   Essential hypertension 11/10/2009   GERD 02/22/2008   NEPHROLITHIASIS, HX OF 11/17/2007    ONSET DATE: referral date 03/25/23  REFERRING DIAG: G20.A2 (ICD-10-CM) - Parkinson's disease without dyskinesia,  with fluctuations R26.81 (ICD-10-CM) - Unsteadiness on feet R41.841 (ICD-10-CM) - Cognitive communication deficit R27.8 (ICD-10-CM) - Other lack of coordination R26.2 (ICD-10-CM) - Difficulty in walking, not elsewhere classified  THERAPY DIAG:  Unsteadiness on feet  Other symptoms and signs involving the nervous system  Muscle weakness (generalized)  Rationale for Evaluation and Treatment: Rehabilitation  SUBJECTIVE:   SUBJECTIVE STATEMENT: [facility] Pt accompanied by: self and significant other (spouse, [name])  PERTINENT HISTORY: history of Parkinson's and ankylosing spondylitis   PRECAUTIONS: Fall  WEIGHT BEARING RESTRICTIONS: No  PAIN:  Are you having pain? "Not more than usual"  generalized pain, unrated    FALLS: Has patient fallen in last 6 months? Yes. Number of falls has had multiple falls over the last few months with multiple in early July, and then 2 falls this month.  LIVING ENVIRONMENT: Lives with: lives with their spouse Lives in: House/apartment Stairs: Yes: External: 2-3 steps; has a ramp at entrance with raised railings Internal: steps to basement and steps to loft - however pt does not need to go up/down those at this time. Has following equipment at home: Dan Humphreys - 2 wheeled, Environmental consultant - 4 wheeled, bed side commode, Shower chair, Ramped entry, and transport chair  PLOF: Requires assistive device for independence and Needs assistance with ADLs  PATIENT GOALS: to be able to move without concern/serious concern  OBJECTIVE:   HAND DOMINANCE: Right  ADLs: Overall ADLs: Pt reports there are times that he can be "fairly independent" with dressing but there are times where spouse has to provide significant assistance with. Transfers/ambulation related to ADLs: Utilizing RW and will occasionally walk bouts through the house without RW.  If pt loses balance backwards he is unable to correct. Eating:  "fumble fingered" UB Dressing: occasional assistance LB Dressing: socks and shoes are quite difficult Toileting: "I go to the bathroom by myself" but has difficulty getting up/down from toilet Bathing: washing at the sink, spouse will assist  Tub Shower transfers: pt feels uncomfortable in the shower with shower chair, but has not fallen Equipment: Shower seat with back and Walk in shower with 5.5" ledge with 30" door  IADLs: Light housekeeping: washes dishes when he is feeling strong, however frequent onset of weakness Meal Prep: Pt was primary cook prior to fall in 2023, but has returned to participation in aspects of meal prep Community mobility: recently renewed drivers license, but is not driving  MOBILITY STATUS: Needs Assist: CGA with mobility with RW and Hx of falls   POSTURE COMMENTS:  rounded shoulders and forward head, posterior pelvic tilt and R lean  FUNCTIONAL OUTCOME MEASURES: Fastening/unfastening 3 buttons: unable to button any buttons in 1 mins Physical performance test: PPT#2 (simulated eating) 22.79 sec & PPT#4 (donning/doffing jacket): TBD  COORDINATION: Box and Blocks:  Right 30 blocks, Left 32 blocks  UE ROM:  all movements are slowed, decreased shoulder flexion d/t ankylosing spondylitis   UE MMT:    grossly 4/5 overall  COGNITION: Overall cognitive status:  Spouse reports thinking continues to be slower  OBSERVATIONS: Bradykinesia   TODAY'S TREATMENT:                                                                         DATE:  06/04/23 LB dressing: engaged in problem solving for LB dressing with focus on increased ease and independence for donning socks and pants.  OT demonstrated use of sock aid, however with concerns that pt still may not be able to place sock onto sock aid and the confusion of adding additional equipment.  OT reiterated use of step stool to increase reach towards feet to don pants.  Engaged in reaching towards feet while each foot  placed on step stool and simulating LB dressing with use of gait belt.  Pt able to achieve figure 4 position with increased ease on RLE than LLE.  Attempted reaching towards foot bilaterally in this position to increase ease with donning socks.  OT educated on single hand method with donning socks.  Provided handouts from OT toolkit for figure 4 position, use of step stool, and single hand method for donning socks.   Large amplitude: engaged in modified LSVT big exercises to carry over to decreased burden of care with transitional movements and LB dressing.  Discussed carryover to functional, dressing tasks.  Pt demonstrating improved forward and downward reach post exercises which may aid in increased independence with LB dressing.   06/02/23 Large amplitude: engaged in modified LSVT big exercises to carry over to decreased burden of care with  sit > stand, transitional movements, and UB dressing.  Discussed carryover to functional, dressing tasks.  Pt unable to complete large amplitude, but given increased time pt able to complete increased ROM especially with reaching forward and towards floor.  OT providing targets for increased ROM. Don/doff jacket: engaged in massed practice with pt's personal jacket (turned out to belong to wife - so somewhat tight) with focus on large amplitude movements and reaching around shoulders/neck vs attempting to reach around lower back.  Pt demonstrating significant improvements when bringing jacket around shoulders, however requiring mod-max multimodal cues for carryover.    05/28/23 Large amplitude: engaged in modified LSVT big exercises to carry over to decreased burden of care with sit > stand, transitional movements, and UB dressing.  Discussed carryover to functional, dressing tasks.  Pt unable to complete large amplitude, but given increased time pt able to complete increased ROM especially with reaching forward and towards floor. Don/doff jacket: pt continues to  benefit from demonstration and verbal cues, however able to complete with increased ease after massed practice. Dynamic sitting: engaged in forward reaching to retrieve cones from floor and then forward reaching to place on target.  Pt with improved trunk control and ROM this session. Sit > stand: pt requiring cues for hand placement and anterior weight shift however improved ease overall this session.   PATIENT EDUCATION: Education details: ongoing condition specific education, community support Person educated: Patient and Spouse Education method: Chief Technology Officer Education comprehension: verbalized understanding and needs further education  HOME EXERCISE PROGRAM: LSVT big exercises  GOALS: Goals reviewed with patient? Yes  SHORT TERM GOALS: Target date: 05/23/23  Pt will be independent in full body HEP with focus on large amplitude movements as needed to increase independence with ADLs.  Baseline: Goal status: Not met - 05/26/23  2.  Pt will perform dynamic standing task for 5 mins as needed for simple IADLs w/o LOB using DME and/or countertop support prn  Baseline:  Goal status: IN PROGRESS  3.  Pt will demonstrate improved ease with sit > stand to get up from toilet or other seating options in home by improving score on sit > stand to decreased fall risk.  Baseline: time TBD Goal status: IN PROGRESS  4.  Pt will demonstrate improved sitting balance and trunk control to reach towards floor to retreive items to allow progression towards LB dressing. Baseline:  Goal status: MET - 05/28/23   LONG TERM GOALS: Target date: 06/20/23  Pt will complete UB dressing, to include clothing fasteners, at Mod I level with use of AE and/or alternative strategies PRN.  Baseline:  Goal status: IN PROGRESS  2.  Pt will be able to don socks and shoes with Supervision/setup with use of AE and/or alternative strategies PRN.  Baseline:  Goal status: IN PROGRESS  3.  Pt will  demonstrate increased ease with dressing as evidenced by decreasing PPT#4(don/ doff jacket) by 10 secs or more.  Baseline: time TBD Goal status: IN PROGRESS  4.  Pt will demonstrate improved UE functional use for ADLs as evidenced by increasing box/ blocks score by 4 blocks with BUE.  Baseline:  Right 30 blocks, Left 32 blocks Goal status: IN PROGRESS  5.  Pt will demonstrate improved dynamic standing balance for 10 mins as needed to engage in ADLs/IADLs with increased safety/endurance.  Baseline:  Goal status: IN PROGRESS  ASSESSMENT:  CLINICAL IMPRESSION: Pt demonstrating improved anterior weight shift and forward weight shift this session, requiring increased time  but overall improving ROM.  Pt demonstrating improved ability to achieve figure 4 position and reach towards R foot but still with difficulty reaching L foot to simulate donning socks and pants.  Pt with freezing with simulated donning of pants.   PERFORMANCE DEFICITS: in functional skills including ADLs, IADLs, coordination, dexterity, ROM, strength, pain, flexibility, Fine motor control, Gross motor control, balance, body mechanics, endurance, decreased knowledge of precautions, decreased knowledge of use of DME, and UE functional use and psychosocial skills including coping strategies, environmental adaptation, and routines and behaviors.   IMPAIRMENTS: are limiting patient from ADLs and IADLs.    PLAN:  OT FREQUENCY: 2x/week  OT DURATION: 8 weeks  PLANNED INTERVENTIONS: self care/ADL training, therapeutic exercise, therapeutic activity, neuromuscular re-education, balance training, functional mobility training, ultrasound, compression bandaging, moist heat, cryotherapy, patient/family education, cognitive remediation/compensation, psychosocial skills training, energy conservation, coping strategies training, and DME and/or AE instructions  RECOMMENDED OTHER SERVICES: NA  CONSULTED AND AGREED WITH PLAN OF CARE:  Patient  PLAN FOR NEXT SESSION: sit pivot on edge of mat with focus on large stepping, review bag exercises, sit > stand, LB dressing.   Juanya Villavicencio, OTR/L 06/04/2023, 3:20 PM

## 2023-06-09 ENCOUNTER — Ambulatory Visit: Payer: Medicare Other

## 2023-06-09 ENCOUNTER — Ambulatory Visit: Payer: Medicare Other | Admitting: Occupational Therapy

## 2023-06-09 DIAGNOSIS — R2681 Unsteadiness on feet: Secondary | ICD-10-CM

## 2023-06-09 DIAGNOSIS — R262 Difficulty in walking, not elsewhere classified: Secondary | ICD-10-CM

## 2023-06-09 DIAGNOSIS — M6281 Muscle weakness (generalized): Secondary | ICD-10-CM | POA: Diagnosis not present

## 2023-06-09 DIAGNOSIS — R278 Other lack of coordination: Secondary | ICD-10-CM | POA: Diagnosis not present

## 2023-06-09 DIAGNOSIS — R293 Abnormal posture: Secondary | ICD-10-CM | POA: Diagnosis not present

## 2023-06-09 DIAGNOSIS — R2689 Other abnormalities of gait and mobility: Secondary | ICD-10-CM

## 2023-06-09 DIAGNOSIS — R29818 Other symptoms and signs involving the nervous system: Secondary | ICD-10-CM

## 2023-06-09 DIAGNOSIS — G20A2 Parkinson's disease without dyskinesia, with fluctuations: Secondary | ICD-10-CM

## 2023-06-09 NOTE — Therapy (Signed)
OUTPATIENT OCC[facility]ATIONAL THERAPY [facility]  Treatment Note  Patient Name: [name] MRN: [id number] DOB:26-Feb-1936, 87 y.o., male Today's Date: 06/09/2023  PCP: Garlan Fillers, MD REFERRING PROVIDER: Huston Foley, MD  END OF SESSION:  OT End of Session - 06/09/23 1405     Visit Number 9    Number of Visits 17    Date for OT Re-Evaluation 06/20/23    Authorization Type Medicare A &B    OT Start Time 1402    OT Stop Time 1444    OT Time Calculation (min) 42 min                     Past Medical History:  Diagnosis Date   Ankylosing spondylitis (HCC) dx'd ~ 1974   Arthritis    "back" (08/10/2015)   BENIGN PROSTATIC HYPERTROPHY, WITH OBSTRUCTION 01/15/2010   Bleeding duodenal ulcer    Bleeding esophageal ulcer    Bleeding stomach ulcer    GERD 02/22/2008   History of blood transfusion 1988   "lost ~ 1/2 of my blood volume from multiple bleeding ulcers"   History of hiatal hernia    HYPERGLYCEMIA 11/18/2007   HYPERLIPIDEMIA 02/22/2008   HYPERTENSION, UNSPECIFIED 11/10/2009   Kidney stones    Osteoarthritis    c-spine   PEPTIC ULCER DISEASE 11/17/2007   Situational depression    "son died in MVA 07-09-2015"   TIA (transient ischemic attack)    "not that I know of in the past; they are trying to determine if I've had one today (08/10/2015)"   Past Surgical History:  Procedure Laterality Date   CATARACT EXTRACTION W/ INTRAOCULAR LENS  IMPLANT, BILATERAL Bilateral 2013   Patient Active Problem List   Diagnosis Date Noted   Ankylosing spondylitis of cervicothoracic region (HCC) 10/16/2016   TIA (transient ischemic attack) 08/10/2015   Carotid stenosis 08/04/2014   Hyperlipidemia 02/21/2012   BENIGN PROSTATIC HYPERTROPHY, WITH OBSTRUCTION 01/15/2010   Essential hypertension 11/10/2009   GERD 02/22/2008   NEPHROLITHIASIS, HX OF 11/17/2007    ONSET DATE: referral date 03/25/23  REFERRING DIAG: G20.A2 (ICD-10-CM) - Parkinson's disease without dyskinesia,  with fluctuations R26.81 (ICD-10-CM) - Unsteadiness on feet R41.841 (ICD-10-CM) - Cognitive communication deficit R27.8 (ICD-10-CM) - Other lack of coordination R26.2 (ICD-10-CM) - Difficulty in walking, not elsewhere classified  THERAPY DIAG:  Unsteadiness on feet  Other symptoms and signs involving the nervous system  Muscle weakness (generalized)  Rationale for Evaluation and Treatment: Rehabilitation  SUBJECTIVE:   SUBJECTIVE STATEMENT: Pt reports "no falls" Pt accompanied by: self and significant other (spouse, [name])  PERTINENT HISTORY: history of Parkinson's and ankylosing spondylitis   PRECAUTIONS: Fall  WEIGHT BEARING RESTRICTIONS: No  PAIN:  Are you having pain? "Not more than usual"  generalized pain, unrated    FALLS: Has patient fallen in last 6 months? Yes. Number of falls has had multiple falls over the last few months with multiple in early July, and then 2 falls this month.  LIVING ENVIRONMENT: Lives with: lives with their spouse Lives in: House/apartment Stairs: Yes: External: 2-3 steps; has a ramp at entrance with raised railings Internal: steps to basement and steps to loft - however pt does not need to go up/down those at this time. Has following equipment at home: Dan Humphreys - 2 wheeled, Environmental consultant - 4 wheeled, bed side commode, Shower chair, Ramped entry, and transport chair  PLOF: Requires assistive device for independence and Needs assistance with ADLs  PATIENT GOALS: to be able to move  without concern/serious concern  OBJECTIVE:   HAND DOMINANCE: Right  ADLs: Overall ADLs: Pt reports there are times that he can be "fairly independent" with dressing but there are times where spouse has to provide significant assistance with. Transfers/ambulation related to ADLs: Utilizing RW and will occasionally walk bouts through the house without RW.  If pt loses balance backwards he is unable to correct. Eating: "fumble fingered" UB Dressing: occasional  assistance LB Dressing: socks and shoes are quite difficult Toileting: "I go to the bathroom by myself" but has difficulty getting up/down from toilet Bathing: washing at the sink, spouse will assist  Tub Shower transfers: pt feels uncomfortable in the shower with shower chair, but has not fallen Equipment: Shower seat with back and Walk in shower with 5.5" ledge with 30" door  IADLs: Light housekeeping: washes dishes when he is feeling strong, however frequent onset of weakness Meal Prep: Pt was primary cook prior to fall in 2023, but has returned to participation in aspects of meal prep Community mobility: recently renewed drivers license, but is not driving  MOBILITY STATUS: Needs Assist: CGA with mobility with RW and Hx of falls   POSTURE COMMENTS:  rounded shoulders and forward head, posterior pelvic tilt and R lean  FUNCTIONAL OUTCOME MEASURES: Fastening/unfastening 3 buttons: unable to button any buttons in 1 mins Physical performance test: PPT#2 (simulated eating) 22.79 sec & PPT#4 (donning/doffing jacket): TBD  COORDINATION: Box and Blocks:  Right 30 blocks, Left 32 blocks  06/09/23: R: 39 blocks, L: 38 blocks  UE ROM:  all movements are slowed, decreased shoulder flexion d/t ankylosing spondylitis   UE MMT:    grossly 4/5 overall  COGNITION: Overall cognitive status:  Spouse reports thinking continues to be slower  OBSERVATIONS: Bradykinesia   TODAY'S TREATMENT:                                                                         DATE:  06/09/23 Large amplitude: engaged in modified LSVT big exercises to carry over to decreased burden of care with transitional movements and dressing.  Pt demonstrating improved forward and downward reach post exercises which may aid in increased independence with LB dressing.  OT providing verbal cues for upright posture when reaching up and out to side.  Pt still with decreased ROM on L, therefore OT providing intermittent tactile  cues to facilitate increased ROM as able. Sit> stand: engaged in massed practice with sit > stand with focus on foot placement and anterior weight shift.  Pt demonstrating improvements with sit > stand with staggered foot placement as well. R: 39 blocks, L: 38 blocks   06/04/23 LB dressing: engaged in problem solving for LB dressing with focus on increased ease and independence for donning socks and pants.  OT demonstrated use of sock aid, however with concerns that pt still may not be able to place sock onto sock aid and the confusion of adding additional equipment.  OT reiterated use of step stool to increase reach towards feet to don pants.  Engaged in reaching towards feet while each foot placed on step stool and simulating LB dressing with use of gait belt.  Pt able to achieve figure 4 position with increased ease on RLE  than LLE.  Attempted reaching towards foot bilaterally in this position to increase ease with donning socks.  OT educated on single hand method with donning socks.  Provided handouts from OT toolkit for figure 4 position, use of step stool, and single hand method for donning socks.   Large amplitude: engaged in modified LSVT big exercises to carry over to decreased burden of care with transitional movements and LB dressing.  Discussed carryover to functional, dressing tasks.  Pt demonstrating improved forward and downward reach post exercises which may aid in increased independence with LB dressing.   06/02/23 Large amplitude: engaged in modified LSVT big exercises to carry over to decreased burden of care with sit > stand, transitional movements, and UB dressing.  Discussed carryover to functional, dressing tasks.  Pt unable to complete large amplitude, but given increased time pt able to complete increased ROM especially with reaching forward and towards floor.  OT providing targets for increased ROM. Don/doff jacket: engaged in massed practice with pt's personal jacket (turned out  to belong to wife - so somewhat tight) with focus on large amplitude movements and reaching around shoulders/neck vs attempting to reach around lower back.  Pt demonstrating significant improvements when bringing jacket around shoulders, however requiring mod-max multimodal cues for carryover.   PATIENT EDUCATION: Education details: ongoing condition specific education, community support Person educated: Patient and Spouse Education method: Chief Technology Officer Education comprehension: verbalized understanding and needs further education  HOME EXERCISE PROGRAM: LSVT big exercises  GOALS: Goals reviewed with patient? Yes  SHORT TERM GOALS: Target date: 05/23/23  Pt will be independent in full body HEP with focus on large amplitude movements as needed to increase independence with ADLs.  Baseline: Goal status: Not met - 05/26/23  2.  Pt will perform dynamic standing task for 5 mins as needed for simple IADLs w/o LOB using DME and/or countertop support prn  Baseline:  Goal status: IN PROGRESS  3.  Pt will demonstrate improved ease with sit > stand to get up from toilet or other seating options in home by improving score on sit > stand to decreased fall risk.  Baseline: time TBD Goal status: IN PROGRESS  4.  Pt will demonstrate improved sitting balance and trunk control to reach towards floor to retreive items to allow progression towards LB dressing. Baseline:  Goal status: MET - 05/28/23   LONG TERM GOALS: Target date: 06/20/23  Pt will complete UB dressing, to include clothing fasteners, at Mod I level with use of AE and/or alternative strategies PRN.  Baseline:  Goal status: IN PROGRESS  2.  Pt will be able to don socks and shoes with Supervision/setup with use of AE and/or alternative strategies PRN.  Baseline:  Goal status: IN PROGRESS  3.  Pt will demonstrate increased ease with dressing as evidenced by decreasing PPT#4(don/ doff jacket) by 10 secs or more.  Baseline:  time TBD Goal status: IN PROGRESS  4.  Pt will demonstrate improved UE functional use for ADLs as evidenced by increasing box/ blocks score by 4 blocks with BUE.  Baseline:  Right 30 blocks, Left 32 blocks Goal status: MET - R: 39, L: 38 blocks on 06/09/23  5.  Pt will demonstrate improved dynamic standing balance for 10 mins as needed to engage in ADLs/IADLs with increased safety/endurance.  Baseline:  Goal status: IN PROGRESS  ASSESSMENT:  CLINICAL IMPRESSION: Pt demonstrating improved anterior weight shift and forward weight shift this session, requiring increased time but overall improving ROM.  Pt continues with difficulty with upright trunk and postural control, benefiting from verbal cues and visual feedback.  OT discussed attempting open chest stretch at home in semi-reclined position for increased opening of shoulders/at trunk.  Pt with improved ease with sit > stand after massed practice.  Also with improvements in motor control with Box and blocks, after cue to not pick up by color as that was self-imposed and slowing him down.   PERFORMANCE DEFICITS: in functional skills including ADLs, IADLs, coordination, dexterity, ROM, strength, pain, flexibility, Fine motor control, Gross motor control, balance, body mechanics, endurance, decreased knowledge of precautions, decreased knowledge of use of DME, and UE functional use and psychosocial skills including coping strategies, environmental adaptation, and routines and behaviors.   IMPAIRMENTS: are limiting patient from ADLs and IADLs.    PLAN:  OT FREQUENCY: 2x/week  OT DURATION: 8 weeks  PLANNED INTERVENTIONS: self care/ADL training, therapeutic exercise, therapeutic activity, neuromuscular re-education, balance training, functional mobility training, ultrasound, compression bandaging, moist heat, cryotherapy, patient/family education, cognitive remediation/compensation, psychosocial skills training, energy conservation, coping  strategies training, and DME and/or AE instructions  RECOMMENDED OTHER SERVICES: NA  CONSULTED AND AGREED WITH PLAN OF CARE: Patient  PLAN FOR NEXT SESSION: sit pivot on edge of mat with focus on large stepping, review bag exercises, sit > stand, LB dressing.   Rosalio Loud, OTR/L 06/09/2023, 2:05 PM

## 2023-06-09 NOTE — Therapy (Signed)
OUTPATIENT PHYSICAL THERAPY NEURO TREATMENT NOTE   Patient Name: [name] MRN: [id number] DOB:October 02, 1935, 87 y.o., male Today's Date: 06/09/2023   PCP: Garlan Fillers REFERRING PROVIDER: Huston Foley, MD   END OF SESSION:  PT End of Session - 06/09/23 1450     Visit Number 11    Number of Visits 17   recert completed 04/29/2023   Date for PT Re-Evaluation 06/20/23    Authorization Type Medicare/[employer]ARP-NEEDS KX due to previous PT and speech this year    Progress Note Due on Visit 10    PT Start Time 1445    PT Stop Time 1530    PT Time Calculation (min) 45 min    Equipment Utilized During Treatment Gait belt    Activity Tolerance Patient tolerated treatment well    Behavior During Therapy WFL for tasks assessed/performed                     Past Medical History:  Diagnosis Date   Ankylosing spondylitis (HCC) dx'd ~ 1974   Arthritis    "back" (08/10/2015)   BENIGN PROSTATIC HYPERTROPHY, WITH OBSTRUCTION 01/15/2010   Bleeding duodenal ulcer    Bleeding esophageal ulcer    Bleeding stomach ulcer    GERD 02/22/2008   History of blood transfusion 1988   "lost ~ 1/2 of my blood volume from multiple bleeding ulcers"   History of hiatal hernia    HYPERGLYCEMIA 11/18/2007   HYPERLIPIDEMIA 02/22/2008   HYPERTENSION, UNSPECIFIED 11/10/2009   Kidney stones    Osteoarthritis    c-spine   PEPTIC ULCER DISEASE 11/17/2007   Situational depression    "son died in MVA 07/13/15"   TIA (transient ischemic attack)    "not that I know of in the past; they are trying to determine if I've had one today (08/10/2015)"   Past Surgical History:  Procedure Laterality Date   CATARACT EXTRACTION W/ INTRAOCULAR LENS  IMPLANT, BILATERAL Bilateral 2013   Patient Active Problem List   Diagnosis Date Noted   Ankylosing spondylitis of cervicothoracic region (HCC) 10/16/2016   TIA (transient ischemic attack) 08/10/2015   Carotid stenosis 08/04/2014   Hyperlipidemia 02/21/2012    BENIGN PROSTATIC HYPERTROPHY, WITH OBSTRUCTION 01/15/2010   Essential hypertension 11/10/2009   GERD 02/22/2008   NEPHROLITHIASIS, HX OF 11/17/2007    ONSET DATE: "several years"  REFERRING DIAG: G20.A2 (ICD-10-CM) - Parkinson's disease with fluctuating manifestations, unspecified whether dyskinesia present R26.81 (ICD-10-CM) - Unsteadiness on feet R41.841 (ICD-10-CM) - Cognitive communication deficit R27.8 (ICD-10-CM) - Other lack of coordination R26.2 (ICD-10-CM) - Difficulty in walking, not elsewhere classified  THERAPY DIAG:  Unsteadiness on feet  Other symptoms and signs involving the nervous system  Muscle weakness (generalized)  Abnormal posture  Other abnormalities of gait and mobility  Parkinson's disease with fluctuating manifestations, unspecified whether dyskinesia present (HCC)  Difficulty in walking, not elsewhere classified  Rationale for Evaluation and Treatment: Rehabilitation  SUBJECTIVE:  SUBJECTIVE STATEMENT: Doing ok, uneventful weekend, no falls  Pt accompanied by: significant other  PERTINENT HISTORY: PD, complex medical history of peptic ulcer disease with history of upper GI bleed, TIA, depression, degenerative disc disease in the neck, osteoarthritis, kidney stones, hyperlipidemia, hypertension, history of hiatal hernia, BPH, arthritis, and ankylosing spondylitis, and parkinsonism   PAIN:  Are you having pain? Report mild pain, still in the ribs   PRECAUTIONS: Fall  RED FLAGS: None   WEIGHT BEARING RESTRICTIONS: No  FALLS: Has patient fallen in last 6 months? Yes. Number of falls unknown, multiple  LIVING ENVIRONMENT: Lives with: lives with their spouse Lives in: House/apartment Stairs: Ramp to enter, stairs to loft and basement, ground floor set-up Has  following equipment at home: Dan Humphreys - 2 wheeled  PLOF: Needs assistance with ADLs, Needs assistance with homemaking, and Needs assistance with gait, supervision for ambulation at household distances  PATIENT GOALS: improve balance, walking, reduce falls  OBJECTIVE:   TODAY'S TREATMENT: 06/09/23 Activity Comments  NU-step "hills" intervals x 8 min For varied resistance and heavy work contrasting with rapid alternating  Gait training -use of agility ladder forwards walking for step length -trials in sidestepping with agility ladder for loading response, requiring heavy tactile cues for body position -pre-gait w/ U-step: seated foot advancement for alternating stepping over the line -Gait w/ U-step and lazer line w/ CGA and cues for step advancement over the line with 25-50% carryover, same cues for negotiating 180 deg turns -RW w/ red band across bar for shin contact for step length                   TODAY'S TREATMENT: 06/04/23 Activity Comments  STS from mat, then gait 20 ft to chair Cues for set up, min A for transfer   TUG with RW 1 min 15 sec; CGA for safety; no cues provided  5xSTS with RW in front  1 min 33 sec with occasional min A and consistent verbal cues  Gait training with RW x146ft Cueing to "march" with turns and "kick towards red band" with straight ahead walking to address freezing; occasional min A for balance   STS 5x with RW in front  Much improved momentum and stability   Nustep L5 x 6 min UEs/LEs  Cueing to maintain speed ~70SPM    PATIENT EDUCATION: Education details: provided [employer], reviewed tips for freezing- pt unable to recall; edu on progress towards goals and remaining impairments  Person educated: Patient and Spouse Education method: Explanation, Demonstration, Tactile cues, Verbal cues, and Handouts Education comprehension: verbalized understanding and returned demonstration    Access Code: [email or web address] URL:  [email or web address] Date: 05/08/2023 Prepared by: [facility] - Outpatient  Rehab - [facility]  Program Notes perform with wife's supervision  Exercises - Mini Squat with Counter Support  - 1 x daily - 5 x weekly - 2 sets - 10 reps - Staggered Stance Forward Backward Weight Shift with Counter Support  - 1 x daily - 5 x weekly - 2 sets - 10 reps - Standing Quarter Turn with Counter Support  - 1 x daily - 5 x weekly - 2 sets - 5 reps - Seated Long Arc Quad with Ankle Weight  - 1 x daily - 5 x weekly - 3 sets - 10 reps - Seated Hip Flexion March with Ankle Weights  - 1 x daily - 5 x weekly - 3 sets - 10 reps - Seated Active Hip Flexion  -  1 x daily - 7 x weekly - 1 sets - 10 reps - 5-10 sec hold        ------------------------------------------------- Objective measures below taken at initial evaluation:  DIAGNOSTIC FINDINGS: n/a for episode  COGNITION: Overall cognitive status: History of cognitive impairments - at baseline   SENSATION: Neuropathy in bilat feet  COORDINATION: Difficulty with rapid alternating movements    MUSCLE TONE: NT   POSTURE: rounded shoulders, forward head, flexed trunk , and head down , kyphosis  LOWER EXTREMITY ROM:     Active  Right Eval Left Eval  Hip flexion    Hip extension    Hip abduction    Hip adduction    Hip internal rotation    Hip external rotation    Knee flexion 120 120  Knee extension 0 0  Ankle dorsiflexion 5 12  Ankle plantarflexion    Ankle inversion    Ankle eversion     (Blank rows = not tested)  LOWER EXTREMITY MMT:    BLE strength grossly 3+/5  BED MOBILITY:  Mod A  TRANSFERS: Assistive device utilized: Environmental consultant - 2 wheeled  Sit to stand: CGA and Min A Stand to sit: SBA Chair to chair: SBA and CGA Floor: Mod A and Max A  RAMP:  Level of Assistance: CGA Assistive device utilized: Environmental consultant - 2 wheeled Ramp Comments:   CURB:  Level of Assistance: CGA Assistive device utilized:  Environmental consultant - 2 wheeled Curb Comments:   STAIRS: NT  GAIT: Gait pattern: step to pattern, decreased stride length, and poor foot clearance- Right Distance walked: 100 Assistive device utilized: Walker - 2 wheeled Level of assistance: SBA and CGA Comments: level surfaces  FUNCTIONAL TESTS:  5 times sit to stand: 45 sec w/ CGA Timed up and go (TUG): 1 min 27 sec  M-CTSIB  Condition 1: Firm Surface, EO 10 Sec, Severe and retro-LOB  Sway  Condition 2: Firm Surface, EC 3 Sec, Severe and retro-LOB  Sway  Condition 3: Foam Surface, EO  Sec,  Sway  Condition 4: Foam Surface, EC  Sec,  Sway      TODAY'S TREATMENT:                                                                                                                              DATE: 04/23/23 Gait training: trials with visual cue on RW for step length. Training in U-step for stride length    PATIENT EDUCATION: Education details: assessment details, gait training in use of visual cues/reference Person educated: Patient and Spouse Education method: Explanation Education comprehension: verbalized understanding  HOME EXERCISE PROGRAM: TBD  GOALS: Goals reviewed with patient? Yes  SHORT TERM GOALS: UPDATED Target date: 05/15/2023>05/23/2023    Patient will be independent in HEP to improve functional outcomes Baseline: has been reviewed with PT and wife previously 06/04/23 Goal status: MET 06/04/23  2.  Teach-back strategies to reduce freezing of gait during turns Baseline: 16  steps to complete 180 deg turn; unable to recall 06/04/23 Goal status: NOT MET 06/04/23  3.  Demo improved safety and independence with sit to stand completing transfers with set-up assist Baseline: CGA-min A; unchanged 06/04/23 Goal status: NOT MET 06/04/23    LONG TERM GOALS: UPDATED Target date: 06/05/2023>06/20/2023    Demo reduced risk for falls per time 35 sec TUG test Baseline: 1 min 27 sec; 1 min 15 sec 06/04/23 Goal status: IN PROGRESS  06/04/23  2.  Demo improved BLE strength and balance per time of 25 sec 5xSTS test to increase independence with mobility Baseline: 45 sec; 1 min 33 sec with occasional min A and consistent verbal cues 06/04/23 Goal status: IN PROGRESS 06/04/23  3.  Demo improved safety with ambulation on level surfaces w/ supervision x 150 ft Baseline: SBA-CGA; occasional min A for balance and cueing to reduce freezing for 160 ft 06/04/23 Goal status: IN PROGRESS 06/04/23    ASSESSMENT:  CLINICAL IMPRESSION: Initiated with NU-step to improve general activity tolerance as well as activity against variable resistance and speed for motor control requiring heavy cues for maintenance of task.  Gait training trials with emphasis on step length and limb advancement with best carryover using heavy visual references for step length, e.g. agility ladder or U-step laser line.  Difficulty with negotiating turns requiring heavy cues for continued step length/AD advancement.  Continued sessions to progress mobility training and provide compensatory/adpative strategies as needed. OBJECTIVE IMPAIRMENTS: Abnormal gait, decreased activity tolerance, decreased balance, decreased coordination, decreased mobility, difficulty walking, decreased ROM, decreased strength, improper body mechanics, and postural dysfunction.   ACTIVITY LIMITATIONS: carrying, lifting, standing, stairs, transfers, bed mobility, and locomotion level  PARTICIPATION LIMITATIONS: meal prep, cleaning, laundry, interpersonal relationship, community activity, and exercise routine  PERSONAL FACTORS: Age, Time since onset of injury/illness/exacerbation, and 3+ comorbidities: PMH  are also affecting patient's functional outcome.   REHAB POTENTIAL: Good  CLINICAL DECISION MAKING: Evolving/moderate complexity  EVALUATION COMPLEXITY: Moderate  PLAN:  PT FREQUENCY: 1-2x/week  PT DURATION: 8 weeks  PLANNED INTERVENTIONS: Therapeutic exercises, Therapeutic  activity, Neuromuscular re-education, Balance training, Gait training, Patient/Family education, Self Care, and Joint mobilization  PLAN FOR NEXT SESSION: Initiate session with Nustep at varied resistance/speeds for high intensity warm-up. review HEP and sit<>stand.  Try again U-step rollator and discuss how to obtain, if we feel this is appropriate.     3:33 PM, 06/09/23 M. Shary Decamp, PT, DPT Physical Therapist- [place] Office Number: [phone]

## 2023-06-10 DIAGNOSIS — Z23 Encounter for immunization: Secondary | ICD-10-CM | POA: Diagnosis not present

## 2023-06-11 ENCOUNTER — Ambulatory Visit: Payer: Medicare Other | Admitting: Occupational Therapy

## 2023-06-11 ENCOUNTER — Encounter: Payer: Self-pay | Admitting: Physical Therapy

## 2023-06-11 ENCOUNTER — Ambulatory Visit: Payer: Medicare Other | Admitting: Physical Therapy

## 2023-06-11 DIAGNOSIS — M6281 Muscle weakness (generalized): Secondary | ICD-10-CM

## 2023-06-11 DIAGNOSIS — R29818 Other symptoms and signs involving the nervous system: Secondary | ICD-10-CM

## 2023-06-11 DIAGNOSIS — R2681 Unsteadiness on feet: Secondary | ICD-10-CM | POA: Diagnosis not present

## 2023-06-11 DIAGNOSIS — R278 Other lack of coordination: Secondary | ICD-10-CM | POA: Diagnosis not present

## 2023-06-11 DIAGNOSIS — R2689 Other abnormalities of gait and mobility: Secondary | ICD-10-CM | POA: Diagnosis not present

## 2023-06-11 DIAGNOSIS — R293 Abnormal posture: Secondary | ICD-10-CM

## 2023-06-11 NOTE — Therapy (Signed)
OUTPATIENT PHYSICAL THERAPY NEURO TREATMENT NOTE   Patient Name: [name] MRN: [id number] DOB:12-Jul-1936, 87 y.o., male Today's Date: 06/12/2023   PCP: Garlan Fillers REFERRING PROVIDER: Huston Foley, MD   END OF SESSION:  PT End of Session - 06/11/23 1443     Visit Number 12    Number of Visits 17   recert completed 04/29/2023   Date for PT Re-Evaluation 06/20/23    Authorization Type Medicare/AARP-NEEDS KX due to previous PT and speech this year    Progress Note Due on Visit 10    PT Start Time 1446    PT Stop Time 1529    PT Time Calculation (min) 43 min    Equipment Utilized During Treatment Gait belt    Activity Tolerance Patient tolerated treatment well    Behavior During Therapy WFL for tasks assessed/performed                      Past Medical History:  Diagnosis Date   Ankylosing spondylitis (HCC) dx'd ~ 1974   Arthritis    "back" (08/10/2015)   BENIGN PROSTATIC HYPERTROPHY, WITH OBSTRUCTION 01/15/2010   Bleeding duodenal ulcer    Bleeding esophageal ulcer    Bleeding stomach ulcer    GERD 02/22/2008   History of blood transfusion 1988   "lost ~ 1/2 of my blood volume from multiple bleeding ulcers"   History of hiatal hernia    HYPERGLYCEMIA 11/18/2007   HYPERLIPIDEMIA 02/22/2008   HYPERTENSION, UNSPECIFIED 11/10/2009   Kidney stones    Osteoarthritis    c-spine   PEPTIC ULCER DISEASE 11/17/2007   Situational depression    "son died in MVA 06/18/2015"   TIA (transient ischemic attack)    "not that I know of in the past; they are trying to determine if I've had one today (08/10/2015)"   Past Surgical History:  Procedure Laterality Date   CATARACT EXTRACTION W/ INTRAOCULAR LENS  IMPLANT, BILATERAL Bilateral 2013   Patient Active Problem List   Diagnosis Date Noted   Ankylosing spondylitis of cervicothoracic region (HCC) 10/16/2016   TIA (transient ischemic attack) 08/10/2015   Carotid stenosis 08/04/2014   Hyperlipidemia 02/21/2012    BENIGN PROSTATIC HYPERTROPHY, WITH OBSTRUCTION 01/15/2010   Essential hypertension 11/10/2009   GERD 02/22/2008   NEPHROLITHIASIS, HX OF 11/17/2007    ONSET DATE: "several years"  REFERRING DIAG: G20.A2 (ICD-10-CM) - Parkinson's disease with fluctuating manifestations, unspecified whether dyskinesia present R26.81 (ICD-10-CM) - Unsteadiness on feet R41.841 (ICD-10-CM) - Cognitive communication deficit R27.8 (ICD-10-CM) - Other lack of coordination R26.2 (ICD-10-CM) - Difficulty in walking, not elsewhere classified  THERAPY DIAG:  Unsteadiness on feet  Other symptoms and signs involving the nervous system  Muscle weakness (generalized)  Rationale for Evaluation and Treatment: Rehabilitation  SUBJECTIVE:  SUBJECTIVE STATEMENT: "Alive and kicking".  Yesterday, fell in the cat's litterbox.  Was a soft fall, did not get hurt.  Wife asks how to make sure he can stay balanced if he feels like he is going backwards, but doesn't have time to take a step.  Pt accompanied by: significant other  PERTINENT HISTORY: PD, complex medical history of peptic ulcer disease with history of upper GI bleed, TIA, depression, degenerative disc disease in the neck, osteoarthritis, kidney stones, hyperlipidemia, hypertension, history of hiatal hernia, BPH, arthritis, and ankylosing spondylitis, and parkinsonism   PAIN:  Are you having pain? Report mild pain, still in the ribs   PRECAUTIONS: Fall  RED FLAGS: None   WEIGHT BEARING RESTRICTIONS: No  FALLS: Has patient fallen in last 6 months? Yes. Number of falls unknown, multiple  LIVING ENVIRONMENT: Lives with: lives with their spouse Lives in: House/apartment Stairs: Ramp to enter, stairs to loft and basement, ground floor set-up Has following equipment at home:  Dan Humphreys - 2 wheeled  PLOF: Needs assistance with ADLs, Needs assistance with homemaking, and Needs assistance with gait, supervision for ambulation at household distances  PATIENT GOALS: improve balance, walking, reduce falls  OBJECTIVE:    TODAY'S TREATMENT: 06/11/2023 Activity Comments  Standing at walker:  stagger stance forward/back weightshift Lateral wide BOS weightshift   Short distance gait, using RW, to target with stop in stagger stance position Varied distances, PT provides manual cues at hips, verbal cues to stagger/widen feet  Varied distance of gait with RW, with PT's unpredictable "STOP" command to try to get patient to stop with staggered/wide foot position He is able to do this about 25% of the time on his own; otherwise, PT provides verbal cues to stagger/widen feet  Standing with RW stopped at counter, practicing pt's previously provided HEP exercise for forward/back rocking with staggered stance position Initial tactile cues at hips; more forward hip excursion than at beginning of session              PATIENT EDUCATION: Education details:  Worked on stagger stance stopped position with use of rocking/weightshifting through hips as a way to try to prevent posterior LOB; ways that wife can cue patient for this to increase carryover Person educated: Patient and Spouse Education method: Explanation, Demonstration, Tactile cues, Verbal cues, and Handouts Education comprehension: verbalized understanding, returned demonstration, and needs further education    Access Code: [email or web address] URL: [email or web address] Date: 05/08/2023 Prepared by: [facility] - Outpatient  Rehab - [facility]  Program Notes perform with wife's supervision  Exercises - Mini Squat with Counter Support  - 1 x daily - 5 x weekly - 2 sets - 10 reps - Staggered Stance Forward Backward Weight Shift with Counter Support  - 1 x daily - 5 x weekly - 2 sets - 10 reps - Standing  Quarter Turn with Counter Support  - 1 x daily - 5 x weekly - 2 sets - 5 reps - Seated Long Arc Quad with Ankle Weight  - 1 x daily - 5 x weekly - 3 sets - 10 reps - Seated Hip Flexion March with Ankle Weights  - 1 x daily - 5 x weekly - 3 sets - 10 reps - Seated Active Hip Flexion  - 1 x daily - 7 x weekly - 1 sets - 10 reps - 5-10 sec hold        ------------------------------------------------- Objective measures below taken at initial evaluation:  DIAGNOSTIC FINDINGS: n/a for episode  COGNITION: Overall  cognitive status: History of cognitive impairments - at baseline   SENSATION: Neuropathy in bilat feet  COORDINATION: Difficulty with rapid alternating movements    MUSCLE TONE: NT   POSTURE: rounded shoulders, forward head, flexed trunk , and head down , kyphosis  LOWER EXTREMITY ROM:     Active  Right Eval Left Eval  Hip flexion    Hip extension    Hip abduction    Hip adduction    Hip internal rotation    Hip external rotation    Knee flexion 120 120  Knee extension 0 0  Ankle dorsiflexion 5 12  Ankle plantarflexion    Ankle inversion    Ankle eversion     (Blank rows = not tested)  LOWER EXTREMITY MMT:    BLE strength grossly 3+/5  BED MOBILITY:  Mod A  TRANSFERS: Assistive device utilized: Environmental consultant - 2 wheeled  Sit to stand: CGA and Min A Stand to sit: SBA Chair to chair: SBA and CGA Floor: Mod A and Max A  RAMP:  Level of Assistance: CGA Assistive device utilized: Environmental consultant - 2 wheeled Ramp Comments:   CURB:  Level of Assistance: CGA Assistive device utilized: Environmental consultant - 2 wheeled Curb Comments:   STAIRS: NT  GAIT: Gait pattern: step to pattern, decreased stride length, and poor foot clearance- Right Distance walked: 100 Assistive device utilized: Walker - 2 wheeled Level of assistance: SBA and CGA Comments: level surfaces  FUNCTIONAL TESTS:  5 times sit to stand: 45 sec w/ CGA Timed up and go (TUG): 1 min 27 sec  M-CTSIB   Condition 1: Firm Surface, EO 10 Sec, Severe and retro-LOB  Sway  Condition 2: Firm Surface, EC 3 Sec, Severe and retro-LOB  Sway  Condition 3: Foam Surface, EO  Sec,  Sway  Condition 4: Foam Surface, EC  Sec,  Sway      TODAY'S TREATMENT:                                                                                                                              DATE: 04/23/23 Gait training: trials with visual cue on RW for step length. Training in U-step for stride length    PATIENT EDUCATION: Education details: assessment details, gait training in use of visual cues/reference Person educated: Patient and Spouse Education method: Explanation Education comprehension: verbalized understanding  HOME EXERCISE PROGRAM: TBD  GOALS: Goals reviewed with patient? Yes  SHORT TERM GOALS: UPDATED Target date: 05/15/2023>05/23/2023    Patient will be independent in HEP to improve functional outcomes Baseline: has been reviewed with PT and wife previously 06/04/23 Goal status: MET 06/04/23  2.  Teach-back strategies to reduce freezing of gait during turns Baseline: 16 steps to complete 180 deg turn; unable to recall 06/04/23 Goal status: NOT MET 06/04/23  3.  Demo improved safety and independence with sit to stand completing transfers with set-up assist Baseline: CGA-min A; unchanged 06/04/23 Goal status: NOT MET  06/04/23    LONG TERM GOALS: UPDATED Target date: 06/05/2023>06/20/2023    Demo reduced risk for falls per time 35 sec TUG test Baseline: 1 min 27 sec; 1 min 15 sec 06/04/23 Goal status: IN PROGRESS 06/04/23  2.  Demo improved BLE strength and balance per time of 25 sec 5xSTS test to increase independence with mobility Baseline: 45 sec; 1 min 33 sec with occasional min A and consistent verbal cues 06/04/23 Goal status: IN PROGRESS 06/04/23  3.  Demo improved safety with ambulation on level surfaces w/ supervision x 150 ft Baseline: SBA-CGA; occasional min A for balance and  cueing to reduce freezing for 160 ft 06/04/23 Goal status: IN PROGRESS 06/04/23    ASSESSMENT:  CLINICAL IMPRESSION: Skilled PT session today focused on standing and gait activities to encourage pt's weightshifting through hips to prevent posterior LOB.  This is based on wife's report that sometimes with his posterior LOB, he is not able to take a step in time to recover.  Within session, pt needs consistent verbal and manual cues for staggering foot position and widening BOS to encourage ant/post rocking of pelvis/hips.  With repetition and practice through session, reviewed his HEP exercise for this motion at counter at end of session, with pt demo improved excursion in ant/posterior direction through his hips.  He will likely benefit from continued practice on this as well as cues to increase carryover to do this more consistently with gait, to prevent falls, in the home.   OBJECTIVE IMPAIRMENTS: Abnormal gait, decreased activity tolerance, decreased balance, decreased coordination, decreased mobility, difficulty walking, decreased ROM, decreased strength, improper body mechanics, and postural dysfunction.   ACTIVITY LIMITATIONS: carrying, lifting, standing, stairs, transfers, bed mobility, and locomotion level  PARTICIPATION LIMITATIONS: meal prep, cleaning, laundry, interpersonal relationship, community activity, and exercise routine  PERSONAL FACTORS: Age, Time since onset of injury/illness/exacerbation, and 3+ comorbidities: PMH  are also affecting patient's functional outcome.   REHAB POTENTIAL: Good  CLINICAL DECISION MAKING: Evolving/moderate complexity  EVALUATION COMPLEXITY: Moderate  PLAN:  PT FREQUENCY: 1-2x/week  PT DURATION: 8 weeks  PLANNED INTERVENTIONS: Therapeutic exercises, Therapeutic activity, Neuromuscular re-education, Balance training, Gait training, Patient/Family education, Self Care, and Joint mobilization  PLAN FOR NEXT SESSION: Initiate session with Nustep  at varied resistance/speeds for high intensity warm-up. review HEP and sit<>stand.  Try again U-step rollator and discuss how to obtain, if we feel this is appropriate.  Next week is LTG check.  Continue to work on staggered foot position and rocking through hips to prevent posterior LOB.   Lonia Blood, PT 06/12/23 11:25 AM Phone: [phone] Fax: [phone]   [name] [address] [place], [address] [place], [place] [address] Phone # [phone] Fax # [phone]

## 2023-06-11 NOTE — Therapy (Signed)
OUTPATIENT OCCUPATIONAL THERAPY PARKINSON'S  Treatment Note  Patient Name: [name] MRN: [id number] DOB:03/30/1936, 87 y.o., male Today's Date: 06/11/2023  PCP: Garlan Fillers, MD REFERRING PROVIDER: Huston Foley, MD  Occupational Therapy Progress Note  Dates of Reporting Period: 04/23/23 to 06/11/23  Objective Reports of Subjective Statement: Pt continues to be limited in ease of sit > stand, overhead and forward reaching as needed for aspects of UB and LB dressing.  Pt with continues to experience falls and freezing with gait.  Pt's spouse pursing additional support in home via [facility] referral from this clinic and personal resources in [place].   END OF SESSION:  OT End of Session - 06/11/23 1401     Visit Number 10    Number of Visits 17    Date for OT Re-Evaluation 06/20/23    Authorization Type Medicare A &B    OT Start Time 1400    OT Stop Time 1442    OT Time Calculation (min) 42 min                      Past Medical History:  Diagnosis Date   Ankylosing spondylitis (HCC) dx'd ~ 1974   Arthritis    "back" (08/10/2015)   BENIGN PROSTATIC HYPERTROPHY, WITH OBSTRUCTION 01/15/2010   Bleeding duodenal ulcer    Bleeding esophageal ulcer    Bleeding stomach ulcer    GERD 02/22/2008   History of blood transfusion 1988   "lost ~ 1/2 of my blood volume from multiple bleeding ulcers"   History of hiatal hernia    HYPERGLYCEMIA 11/18/2007   HYPERLIPIDEMIA 02/22/2008   HYPERTENSION, UNSPECIFIED 11/10/2009   Kidney stones    Osteoarthritis    c-spine   PEPTIC ULCER DISEASE 11/17/2007   Situational depression    "son died in MVA 15-Jul-2015"   TIA (transient ischemic attack)    "not that I know of in the past; they are trying to determine if I've had one today (08/10/2015)"   Past Surgical History:  Procedure Laterality Date   CATARACT EXTRACTION W/ INTRAOCULAR LENS  IMPLANT, BILATERAL Bilateral 2013   Patient Active Problem List   Diagnosis Date  Noted   Ankylosing spondylitis of cervicothoracic region (HCC) 10/16/2016   TIA (transient ischemic attack) 08/10/2015   Carotid stenosis 08/04/2014   Hyperlipidemia 02/21/2012   BENIGN PROSTATIC HYPERTROPHY, WITH OBSTRUCTION 01/15/2010   Essential hypertension 11/10/2009   GERD 02/22/2008   NEPHROLITHIASIS, HX OF 11/17/2007    ONSET DATE: referral date 03/25/23  REFERRING DIAG: G20.A2 (ICD-10-CM) - Parkinson's disease without dyskinesia, with fluctuations R26.81 (ICD-10-CM) - Unsteadiness on feet R41.841 (ICD-10-CM) - Cognitive communication deficit R27.8 (ICD-10-CM) - Other lack of coordination R26.2 (ICD-10-CM) - Difficulty in walking, not elsewhere classified  THERAPY DIAG:  Other symptoms and signs involving the nervous system  Unsteadiness on feet  Muscle weakness (generalized)  Abnormal posture  Rationale for Evaluation and Treatment: Rehabilitation  SUBJECTIVE:   SUBJECTIVE STATEMENT: Pt 's spouse reports that she has been in contact with palliative care company in [place] and has an appt with NP next Tues.   Pt accompanied by: self and significant other (spouse, [name])  PERTINENT HISTORY: history of Parkinson's and ankylosing spondylitis   PRECAUTIONS: Fall  WEIGHT BEARING RESTRICTIONS: No  PAIN:  Are you having pain? "Not more than usual"  generalized pain, unrated    FALLS: Has patient fallen in last 6 months? Yes. Number of falls has had multiple falls over the last few  months with multiple in early July, and then 2 falls this month.  LIVING ENVIRONMENT: Lives with: lives with their spouse Lives in: House/apartment Stairs: Yes: External: 2-3 steps; has a ramp at entrance with raised railings Internal: steps to basement and steps to loft - however pt does not need to go up/down those at this time. Has following equipment at home: Dan Humphreys - 2 wheeled, Environmental consultant - 4 wheeled, bed side commode, Shower chair, Ramped entry, and transport chair  PLOF: Requires  assistive device for independence and Needs assistance with ADLs  PATIENT GOALS: to be able to move without concern/serious concern  OBJECTIVE:   HAND DOMINANCE: Right  ADLs: Overall ADLs: Pt reports there are times that he can be "fairly independent" with dressing but there are times where spouse has to provide significant assistance with. Transfers/ambulation related to ADLs: Utilizing RW and will occasionally walk bouts through the house without RW.  If pt loses balance backwards he is unable to correct. Eating: "fumble fingered" UB Dressing: occasional assistance LB Dressing: socks and shoes are quite difficult Toileting: "I go to the bathroom by myself" but has difficulty getting up/down from toilet Bathing: washing at the sink, spouse will assist  Tub Shower transfers: pt feels uncomfortable in the shower with shower chair, but has not fallen Equipment: Shower seat with back and Walk in shower with 5.5" ledge with 30" door  IADLs: Light housekeeping: washes dishes when he is feeling strong, however frequent onset of weakness Meal Prep: Pt was primary cook prior to fall in 2023, but has returned to participation in aspects of meal prep Community mobility: recently renewed drivers license, but is not driving  MOBILITY STATUS: Needs Assist: CGA with mobility with RW and Hx of falls   POSTURE COMMENTS:  rounded shoulders and forward head, posterior pelvic tilt and R lean  FUNCTIONAL OUTCOME MEASURES: Fastening/unfastening 3 buttons: unable to button any buttons in 1 mins Physical performance test: PPT#2 (simulated eating) 22.79 sec & PPT#4 (donning/doffing jacket): TBD  COORDINATION: Box and Blocks:  Right 30 blocks, Left 32 blocks  06/09/23: R: 39 blocks, L: 38 blocks  UE ROM:  all movements are slowed, decreased shoulder flexion d/t ankylosing spondylitis   UE MMT:    grossly 4/5 overall  COGNITION: Overall cognitive status:  Spouse reports thinking continues to be  slower  OBSERVATIONS: Bradykinesia   TODAY'S TREATMENT:                                                                         DATE:  06/11/23 Supine ROM: engaged in shoulder flexion and abduction stretch in supine to allow for increased scapular and spine support and gravity-minimized/eliminated positioning. OT utilizing tactile cues and targets to facilitate increased reaching overhead and out to side.  OT facilitating increased reach with tactile cues along elbow of LUE.  Utilized dowel in BUE with shoulder flexion to facilitate increased ROM with inconsistent results. Bed mobility: pt requiring use of momentum for sit > supine as well as supine > sit but able to complete without physical assistance. UE ROM: engaged in reaching for cones in sitting with focus on increased ROM and active reach out to side and across midline to place onto target.  Pt  completing bilaterally with increased effort on L. Encouraged placement of clothing or other items just outside of reach (when in safe sitting position) to facilitate increased reach and weight shifting.   06/09/23 Large amplitude: engaged in modified LSVT big exercises to carry over to decreased burden of care with transitional movements and dressing.  Pt demonstrating improved forward and downward reach post exercises which may aid in increased independence with LB dressing.  OT providing verbal cues for upright posture when reaching up and out to side.  Pt still with decreased ROM on L, therefore OT providing intermittent tactile cues to facilitate increased ROM as able. Sit> stand: engaged in massed practice with sit > stand with focus on foot placement and anterior weight shift.  Pt demonstrating improvements with sit > stand with staggered foot placement as well. R: 39 blocks, L: 38 blocks   06/04/23 LB dressing: engaged in problem solving for LB dressing with focus on increased ease and independence for donning socks and pants.  OT  demonstrated use of sock aid, however with concerns that pt still may not be able to place sock onto sock aid and the confusion of adding additional equipment.  OT reiterated use of step stool to increase reach towards feet to don pants.  Engaged in reaching towards feet while each foot placed on step stool and simulating LB dressing with use of gait belt.  Pt able to achieve figure 4 position with increased ease on RLE than LLE.  Attempted reaching towards foot bilaterally in this position to increase ease with donning socks.  OT educated on single hand method with donning socks.  Provided handouts from OT toolkit for figure 4 position, use of step stool, and single hand method for donning socks.   Large amplitude: engaged in modified LSVT big exercises to carry over to decreased burden of care with transitional movements and LB dressing.  Discussed carryover to functional, dressing tasks.  Pt demonstrating improved forward and downward reach post exercises which may aid in increased independence with LB dressing.  PATIENT EDUCATION: Education details: ongoing condition specific education, community support Person educated: Patient and Spouse Education method: Chief Technology Officer Education comprehension: verbalized understanding and needs further education  HOME EXERCISE PROGRAM: LSVT big exercises  GOALS: Goals reviewed with patient? Yes  SHORT TERM GOALS: Target date: 05/23/23  Pt will be independent in full body HEP with focus on large amplitude movements as needed to increase independence with ADLs.  Baseline: Goal status: Not met - 05/26/23  2.  Pt will perform dynamic standing task for 5 mins as needed for simple IADLs w/o LOB using DME and/or countertop support prn  Baseline:  Goal status: IN PROGRESS  3.  Pt will demonstrate improved ease with sit > stand to get up from toilet or other seating options in home by improving score on sit > stand to decreased fall risk.  Baseline:  time TBD Goal status: IN PROGRESS  4.  Pt will demonstrate improved sitting balance and trunk control to reach towards floor to retreive items to allow progression towards LB dressing. Baseline:  Goal status: MET - 05/28/23   LONG TERM GOALS: Target date: 06/20/23  Pt will complete UB dressing, to include clothing fasteners, at Mod I level with use of AE and/or alternative strategies PRN.  Baseline:  Goal status: IN PROGRESS  2.  Pt will be able to don socks and shoes with Supervision/setup with use of AE and/or alternative strategies PRN.  Baseline:  Goal  status: IN PROGRESS  3.  Pt will demonstrate increased ease with dressing as evidenced by decreasing PPT#4(don/ doff jacket) by 10 secs or more.  Baseline: time TBD Goal status: IN PROGRESS  4.  Pt will demonstrate improved UE functional use for ADLs as evidenced by increasing box/ blocks score by 4 blocks with BUE.  Baseline:  Right 30 blocks, Left 32 blocks Goal status: MET - R: 39, L: 38 blocks on 06/09/23  5.  Pt will demonstrate improved dynamic standing balance for 10 mins as needed to engage in ADLs/IADLs with increased safety/endurance.  Baseline:  Goal status: IN PROGRESS  ASSESSMENT:  CLINICAL IMPRESSION: Pt demonstrating improved overhead reach and open chest reaching in supine, semi-reclined on wedge.  Pt continues to benefit from tactile cues and targets for increased functional reach and upright posture.  Pt demonstrating increased functional reach post massed practice with stretching.   PERFORMANCE DEFICITS: in functional skills including ADLs, IADLs, coordination, dexterity, ROM, strength, pain, flexibility, Fine motor control, Gross motor control, balance, body mechanics, endurance, decreased knowledge of precautions, decreased knowledge of use of DME, and UE functional use and psychosocial skills including coping strategies, environmental adaptation, and routines and behaviors.   IMPAIRMENTS: are limiting  patient from ADLs and IADLs.    PLAN:  OT FREQUENCY: 2x/week  OT DURATION: 8 weeks  PLANNED INTERVENTIONS: self care/ADL training, therapeutic exercise, therapeutic activity, neuromuscular re-education, balance training, functional mobility training, ultrasound, compression bandaging, moist heat, cryotherapy, patient/family education, cognitive remediation/compensation, psychosocial skills training, energy conservation, coping strategies training, and DME and/or AE instructions  RECOMMENDED OTHER SERVICES: NA  CONSULTED AND AGREED WITH PLAN OF CARE: Patient  PLAN FOR NEXT SESSION: sit pivot on edge of mat with focus on large stepping, review bag exercises, sit > stand, LB dressing.   Rosalio Loud, OTR/L 06/11/2023, 2:01 PM

## 2023-06-13 ENCOUNTER — Ambulatory Visit: Payer: Self-pay | Admitting: Licensed Clinical Social Worker

## 2023-06-13 NOTE — Patient Instructions (Signed)
Visit Information  Thank you for taking time to visit with me today. Please don't hesitate to contact me if I can be of assistance to you.   Following are the goals we discussed today:   Goals Addressed             This Visit's Progress    Obtain Supportive Resources-Caregiver Strain   On track    Activities and task to complete in order to accomplish goals.   Keep all upcoming appointments discussed today Continue with compliance of taking medication prescribed by Doctor Implement healthy coping skills discussed to assist with management of symptoms         Please call the care guide team at [phone] if you need to cancel or reschedule your appointment.   If you are experiencing a Mental Health or Behavioral Health Crisis or need someone to talk to, please call the Suicide and Crisis Lifeline: 988 call 911   Patient verbalizes understanding of instructions and care plan provided today and agrees to view in MyChart. Active MyChart status and patient understanding of how to access instructions and care plan via MyChart confirmed with patient.     Jenel Lucks, MSW, LCSW [facility] Management [place]  [facility] [email or web address] Phone [phone] 5:07 PM

## 2023-06-13 NOTE — Therapy (Signed)
OUTPATIENT PHYSICAL THERAPY NEURO TREATMENT NOTE   Patient Name: [name] MRN: [id number] DOB:1935-12-09, 87 y.o., male Today's Date: 06/16/2023   PCP: Garlan Fillers REFERRING PROVIDER: Huston Foley, MD   END OF SESSION:  PT End of Session - 06/16/23 1532     Visit Number 13    Number of Visits 17   recert completed 04/29/2023   Date for PT Re-Evaluation 06/20/23    Authorization Type Medicare/[employer]-NEEDS KX due to previous PT and speech this year    Progress Note Due on Visit 10    PT Start Time 1449    PT Stop Time 1530    PT Time Calculation (min) 41 min    Equipment Utilized During Treatment Gait belt    Activity Tolerance Patient tolerated treatment well    Behavior During Therapy WFL for tasks assessed/performed                       Past Medical History:  Diagnosis Date   Ankylosing spondylitis (HCC) dx'd ~ 1974   Arthritis    "back" (08/10/2015)   BENIGN PROSTATIC HYPERTROPHY, WITH OBSTRUCTION 01/15/2010   Bleeding duodenal ulcer    Bleeding esophageal ulcer    Bleeding stomach ulcer    GERD 02/22/2008   History of blood transfusion 1988   "lost ~ 1/2 of my blood volume from multiple bleeding ulcers"   History of hiatal hernia    HYPERGLYCEMIA 11/18/2007   HYPERLIPIDEMIA 02/22/2008   HYPERTENSION, UNSPECIFIED 11/10/2009   Kidney stones    Osteoarthritis    c-spine   PEPTIC ULCER DISEASE 11/17/2007   Situational depression    "son died in MVA Jun 21, 2015"   TIA (transient ischemic attack)    "not that I know of in the past; they are trying to determine if I've had one today (08/10/2015)"   Past Surgical History:  Procedure Laterality Date   CATARACT EXTRACTION W/ INTRAOCULAR LENS  IMPLANT, BILATERAL Bilateral 2013   Patient Active Problem List   Diagnosis Date Noted   Ankylosing spondylitis of cervicothoracic region (HCC) 10/16/2016   TIA (transient ischemic attack) 08/10/2015   Carotid stenosis 08/04/2014   Hyperlipidemia 02/21/2012    BENIGN PROSTATIC HYPERTROPHY, WITH OBSTRUCTION 01/15/2010   Essential hypertension 11/10/2009   GERD 02/22/2008   NEPHROLITHIASIS, HX OF 11/17/2007    ONSET DATE: "several years"  REFERRING DIAG: G20.A2 (ICD-10-CM) - Parkinson's disease with fluctuating manifestations, unspecified whether dyskinesia present R26.81 (ICD-10-CM) - Unsteadiness on feet R41.841 (ICD-10-CM) - Cognitive communication deficit R27.8 (ICD-10-CM) - Other lack of coordination R26.2 (ICD-10-CM) - Difficulty in walking, not elsewhere classified  THERAPY DIAG:  Other symptoms and signs involving the nervous system  Unsteadiness on feet  Muscle weakness (generalized)  Abnormal posture  Other abnormalities of gait and mobility  Rationale for Evaluation and Treatment: Rehabilitation  SUBJECTIVE:  SUBJECTIVE STATEMENT: Has been practicing exercises at the counter. Wife reports some remaining trouble with wt shifting.   Pt accompanied by: significant other  PERTINENT HISTORY: PD, complex medical history of peptic ulcer disease with history of upper GI bleed, TIA, depression, degenerative disc disease in the neck, osteoarthritis, kidney stones, hyperlipidemia, hypertension, history of hiatal hernia, BPH, arthritis, and ankylosing spondylitis, and parkinsonism   PAIN:  Are you having pain? Report mild pain in the R groin with Nustep   PRECAUTIONS: Fall  RED FLAGS: None   WEIGHT BEARING RESTRICTIONS: No  FALLS: Has patient fallen in last 6 months? Yes. Number of falls unknown, multiple  LIVING ENVIRONMENT: Lives with: lives with their spouse Lives in: House/apartment Stairs: Ramp to enter, stairs to loft and basement, ground floor set-up Has following equipment at home: Dan Humphreys - 2 wheeled  PLOF: Needs assistance with ADLs,  Needs assistance with homemaking, and Needs assistance with gait, supervision for ambulation at household distances  PATIENT GOALS: improve balance, walking, reduce falls  OBJECTIVE:     TODAY'S TREATMENT: 06/16/23 Activity Comments  Gait with RW from mat to nustep; cues for large steps, min A for STS   nustep intervals L3 x 2 min, L6 x1 min,  L3 x 2 min Cueing for large amplitude movement throughout   review of seated HEP: LAQ #2 10x each  sitting march #2 20x  sitting forward trunk leans 10x  Cues to sit up tall, large amplitude ; had pt count out loud for increased participation. Pt appears sleepy and reduced amplitude with increased reps   STS x3 All required at least min A  gait training with U step and 4WW 2x162ft Cueing to "kick" to take a step; occasional resetting balance to prevent retropulsion              Access Code: [email or web address] URL: [email or web address] Date: 05/08/2023 Prepared by: [facility] - [facility]  Program Notes perform with wife's supervision  Exercises - Mini Squat with Counter Support  - 1 x daily - 5 x weekly - 2 sets - 10 reps - Staggered Stance Forward Backward Weight Shift with Counter Support  - 1 x daily - 5 x weekly - 2 sets - 10 reps - Standing Quarter Turn with Counter Support  - 1 x daily - 5 x weekly - 2 sets - 5 reps - Seated Long Arc Quad with Ankle Weight  - 1 x daily - 5 x weekly - 3 sets - 10 reps - Seated Hip Flexion March with Ankle Weights  - 1 x daily - 5 x weekly - 3 sets - 10 reps - Seated Active Hip Flexion  - 1 x daily - 7 x weekly - 1 sets - 10 reps - 5-10 sec hold        ------------------------------------------------- Objective measures below taken at initial evaluation:  DIAGNOSTIC FINDINGS: n/a for episode  COGNITION: Overall cognitive status: History of cognitive impairments - at baseline   SENSATION: Neuropathy in bilat feet  COORDINATION: Difficulty with rapid  alternating movements    MUSCLE TONE: NT   POSTURE: rounded shoulders, forward head, flexed trunk , and head down , kyphosis  LOWER EXTREMITY ROM:     Active  Right Eval Left Eval  Hip flexion    Hip extension    Hip abduction    Hip adduction    Hip internal rotation    Hip external rotation    Knee flexion 120 120  Knee extension 0 0  Ankle dorsiflexion 5 12  Ankle plantarflexion    Ankle inversion    Ankle eversion     (Blank rows = not tested)  LOWER EXTREMITY MMT:    BLE strength grossly 3+/5  BED MOBILITY:  Mod A  TRANSFERS: Assistive device utilized: Environmental consultant - 2 wheeled  Sit to stand: CGA and Min A Stand to sit: SBA Chair to chair: SBA and CGA Floor: Mod A and Max A  RAMP:  Level of Assistance: CGA Assistive device utilized: Environmental consultant - 2 wheeled Ramp Comments:   CURB:  Level of Assistance: CGA Assistive device utilized: Environmental consultant - 2 wheeled Curb Comments:   STAIRS: NT  GAIT: Gait pattern: step to pattern, decreased stride length, and poor foot clearance- Right Distance walked: 100 Assistive device utilized: Walker - 2 wheeled Level of assistance: SBA and CGA Comments: level surfaces  FUNCTIONAL TESTS:  5 times sit to stand: 45 sec w/ CGA Timed up and go (TUG): 1 min 27 sec  M-CTSIB  Condition 1: Firm Surface, EO 10 Sec, Severe and retro-LOB  Sway  Condition 2: Firm Surface, EC 3 Sec, Severe and retro-LOB  Sway  Condition 3: Foam Surface, EO  Sec,  Sway  Condition 4: Foam Surface, EC  Sec,  Sway      TODAY'S TREATMENT:                                                                                                                              DATE: 04/23/23 Gait training: trials with visual cue on RW for step length. Training in U-step for stride length    PATIENT EDUCATION: Education details: assessment details, gait training in use of visual cues/reference Person educated: Patient and Spouse Education method: Explanation Education  comprehension: verbalized understanding  HOME EXERCISE PROGRAM: TBD  GOALS: Goals reviewed with patient? Yes  SHORT TERM GOALS: UPDATED Target date: 05/15/2023>05/23/2023    Patient will be independent in HEP to improve functional outcomes Baseline: has been reviewed with PT and wife previously 06/04/23 Goal status: MET 06/04/23  2.  Teach-back strategies to reduce freezing of gait during turns Baseline: 16 steps to complete 180 deg turn; unable to recall 06/04/23 Goal status: NOT MET 06/04/23  3.  Demo improved safety and independence with sit to stand completing transfers with set-up assist Baseline: CGA-min A; unchanged 06/04/23 Goal status: NOT MET 06/04/23    LONG TERM GOALS: UPDATED Target date: 06/05/2023>06/20/2023    Demo reduced risk for falls per time 35 sec TUG test Baseline: 1 min 27 sec; 1 min 15 sec 06/04/23 Goal status: IN PROGRESS 06/04/23  2.  Demo improved BLE strength and balance per time of 25 sec 5xSTS test to increase independence with mobility Baseline: 45 sec; 1 min 33 sec with occasional min A and consistent verbal cues 06/04/23 Goal status: IN PROGRESS 06/04/23  3.  Demo improved safety with ambulation on level surfaces w/ supervision x  150 ft Baseline: SBA-CGA; occasional min A for balance and cueing to reduce freezing for 160 ft 06/04/23 Goal status: IN PROGRESS 06/04/23    ASSESSMENT:  CLINICAL IMPRESSION: Patient arrived to session with wife without initial complains. Reviewed seated HEP for max understanding- required demo for pacing. Gait training with different types of walkers revealed difficulty using Ustep d/t increased forward momentum required; slightly better performance with 4WW but this will still need more practice. No complaints at end of session.  OBJECTIVE IMPAIRMENTS: Abnormal gait, decreased activity tolerance, decreased balance, decreased coordination, decreased mobility, difficulty walking, decreased ROM, decreased strength, improper  body mechanics, and postural dysfunction.   ACTIVITY LIMITATIONS: carrying, lifting, standing, stairs, transfers, bed mobility, and locomotion level  PARTICIPATION LIMITATIONS: meal prep, cleaning, laundry, interpersonal relationship, community activity, and exercise routine  PERSONAL FACTORS: Age, Time since onset of injury/illness/exacerbation, and 3+ comorbidities: PMH  are also affecting patient's functional outcome.   REHAB POTENTIAL: Good  CLINICAL DECISION MAKING: Evolving/moderate complexity  EVALUATION COMPLEXITY: Moderate  PLAN:  PT FREQUENCY: 1-2x/week  PT DURATION: 8 weeks  PLANNED INTERVENTIONS: Therapeutic exercises, Therapeutic activity, Neuromuscular re-education, Balance training, Gait training, Patient/Family education, Self Care, and Joint mobilization  PLAN FOR NEXT SESSION: Initiate session with Nustep at varied resistance/speeds for high intensity warm-up. review HEP and sit<>stand.  Try again U-step rollator and discuss how to obtain, if we feel this is appropriate.  Next week is LTG check.  Continue to work on staggered foot position and rocking through hips to prevent posterior LOB.   Anette Guarneri, PT, DPT 06/16/23 3:34 PM  [facility] at [facility] [address] [place], [address] [place], [place] [address] Phone # [phone] Fax # [phone]

## 2023-06-13 NOTE — Patient Outreach (Signed)
Care Coordination   Follow Up Visit Note   06/13/2023 Name: [name] MRN: [id number] DOB: 1936-03-18  [name] is a 87 y.o. year old male who sees Casey Fillers, MD for primary care. I spoke with  [name]'s spouse by phone today.  What matters to the patients health and wellness today?  DME, Caregiver Strain, Symptom Management    Goals Addressed             This Visit's Progress    Obtain Supportive Resources-Caregiver Strain   On track    Activities and task to complete in order to accomplish goals.   Keep all upcoming appointments discussed today Continue with compliance of taking medication prescribed by Doctor Implement healthy coping skills discussed to assist with management of symptoms         SDOH assessments and interventions completed:  No     Care Coordination Interventions:  Yes, provided  Interventions Today    Flowsheet Row Most Recent Value  Chronic Disease   Chronic disease during today's visit Hypertension (HTN), Other  [Hx of TIA]  General Interventions   General Interventions Discussed/Reviewed General Interventions Reviewed, [facility], Horticulturist, commercial (DME), Doctor Visits, Communication with  Doctor Visits Discussed/Reviewed Doctor Visits Reviewed  Durable Medical Equipment (DME) Bed side commode  [Family is interested in bedside commode to limit ambulating throughout the night. Pt has a hx of falls and it is difficult for spouse to lift]  Communication with PCP/Specialists  [LCSW spoke with [name] at PCP office requesting a prescription for bedside commode. Prefers prescription is sent to [facility]. LCSW left detailed message for [name]. Was encouraged to call PCP office on Monday, 10/21 if call is not returned]  Mental Health Interventions   Mental Health Discussed/Reviewed Mental Health Reviewed, Coping Strategies, Anxiety  Pharmacy Interventions   Pharmacy Dicussed/Reviewed Pharmacy Topics Reviewed   Safety Interventions   Safety Discussed/Reviewed Safety Reviewed, Fall Risk       Follow up plan: Follow up call scheduled for 10/21    Encounter Outcome:  Patient Visit Completed   [name], MSW, LCSW [facility] Care Management [facility]  [facility] [name] Phone [phone] 5:06 PM

## 2023-06-16 ENCOUNTER — Encounter: Payer: Self-pay | Admitting: Licensed Clinical Social Worker

## 2023-06-16 ENCOUNTER — Encounter: Payer: Self-pay | Admitting: Physical Therapy

## 2023-06-16 ENCOUNTER — Ambulatory Visit: Payer: Medicare Other | Admitting: Physical Therapy

## 2023-06-16 ENCOUNTER — Ambulatory Visit: Payer: Medicare Other | Admitting: Occupational Therapy

## 2023-06-16 DIAGNOSIS — R2681 Unsteadiness on feet: Secondary | ICD-10-CM

## 2023-06-16 DIAGNOSIS — R293 Abnormal posture: Secondary | ICD-10-CM

## 2023-06-16 DIAGNOSIS — M6281 Muscle weakness (generalized): Secondary | ICD-10-CM | POA: Diagnosis not present

## 2023-06-16 DIAGNOSIS — R278 Other lack of coordination: Secondary | ICD-10-CM | POA: Diagnosis not present

## 2023-06-16 DIAGNOSIS — R29818 Other symptoms and signs involving the nervous system: Secondary | ICD-10-CM

## 2023-06-16 DIAGNOSIS — R2689 Other abnormalities of gait and mobility: Secondary | ICD-10-CM

## 2023-06-16 NOTE — Therapy (Signed)
OUTPATIENT OCCUPATIONAL THERAPY PARKINSON'S  Treatment Note  Patient Name: [name] MRN: [id number] DOB:Aug 08, 1936, 87 y.o., male Today's Date: 06/17/2023  PCP: Garlan Fillers, MD REFERRING PROVIDER: Huston Foley, MD     END OF SESSION:  OT End of Session - 06/16/23 1411     Visit Number 11    Number of Visits 17    Date for OT Re-Evaluation 06/20/23    Authorization Type Medicare A &B    OT Start Time 1407    OT Stop Time 1447    OT Time Calculation (min) 40 min                       Past Medical History:  Diagnosis Date   Ankylosing spondylitis (HCC) dx'd ~ 1974   Arthritis    "back" (08/10/2015)   BENIGN PROSTATIC HYPERTROPHY, WITH OBSTRUCTION 01/15/2010   Bleeding duodenal ulcer    Bleeding esophageal ulcer    Bleeding stomach ulcer    GERD 02/22/2008   History of blood transfusion 1988   "lost ~ 1/2 of my blood volume from multiple bleeding ulcers"   History of hiatal hernia    HYPERGLYCEMIA 11/18/2007   HYPERLIPIDEMIA 02/22/2008   HYPERTENSION, UNSPECIFIED 11/10/2009   Kidney stones    Osteoarthritis    c-spine   PEPTIC ULCER DISEASE 11/17/2007   Situational depression    "son died in MVA 07/08/2015"   TIA (transient ischemic attack)    "not that I know of in the past; they are trying to determine if I've had one today (08/10/2015)"   Past Surgical History:  Procedure Laterality Date   CATARACT EXTRACTION W/ INTRAOCULAR LENS  IMPLANT, BILATERAL Bilateral 2013   Patient Active Problem List   Diagnosis Date Noted   Ankylosing spondylitis of cervicothoracic region (HCC) 10/16/2016   TIA (transient ischemic attack) 08/10/2015   Carotid stenosis 08/04/2014   Hyperlipidemia 02/21/2012   BENIGN PROSTATIC HYPERTROPHY, WITH OBSTRUCTION 01/15/2010   Essential hypertension 11/10/2009   GERD 02/22/2008   NEPHROLITHIASIS, HX OF 11/17/2007    ONSET DATE: referral date 03/25/23  REFERRING DIAG: G20.A2 (ICD-10-CM) - Parkinson's disease without  dyskinesia, with fluctuations R26.81 (ICD-10-CM) - Unsteadiness on feet R41.841 (ICD-10-CM) - Cognitive communication deficit R27.8 (ICD-10-CM) - Other lack of coordination R26.2 (ICD-10-CM) - Difficulty in walking, not elsewhere classified  THERAPY DIAG:  Other symptoms and signs involving the nervous system  Unsteadiness on feet  Muscle weakness (generalized)  Abnormal posture  Other lack of coordination  Rationale for Evaluation and Treatment: Rehabilitation  SUBJECTIVE:   SUBJECTIVE STATEMENT: Pt's spouse reports that pt has an appt with palliative care agency tomorrow.  F/u call from [facility] social worker last Friday, however had not yet received any information from initial call.  Pt reports having an "awful" time getting dressed today. Pt accompanied by: self and significant other (spouse, [name])  PERTINENT HISTORY: history of Parkinson's and ankylosing spondylitis   PRECAUTIONS: Fall  WEIGHT BEARING RESTRICTIONS: No  PAIN:  Are you having pain? "Not more than usual"  generalized pain, unrated    FALLS: Has patient fallen in last 6 months? Yes. Number of falls has had multiple falls over the last few months with multiple in early July, and then 2 falls this month.  LIVING ENVIRONMENT: Lives with: lives with their spouse Lives in: House/apartment Stairs: Yes: External: 2-3 steps; has a ramp at entrance with raised railings Internal: steps to basement and steps to loft - however pt does not  need to go up/down those at this time. Has following equipment at home: Dan Humphreys - 2 wheeled, Environmental consultant - 4 wheeled, bed side commode, Shower chair, Ramped entry, and transport chair  PLOF: Requires assistive device for independence and Needs assistance with ADLs  PATIENT GOALS: to be able to move without concern/serious concern  OBJECTIVE:   HAND DOMINANCE: Right  ADLs: Overall ADLs: Pt reports there are times that he can be "fairly independent" with dressing but there are times where  spouse has to provide significant assistance with. Transfers/ambulation related to ADLs: Utilizing RW and will occasionally walk bouts through the house without RW.  If pt loses balance backwards he is unable to correct. Eating: "fumble fingered" UB Dressing: occasional assistance LB Dressing: socks and shoes are quite difficult Toileting: "I go to the bathroom by myself" but has difficulty getting up/down from toilet Bathing: washing at the sink, spouse will assist  Tub Shower transfers: pt feels uncomfortable in the shower with shower chair, but has not fallen Equipment: Shower seat with back and Walk in shower with 5.5" ledge with 30" door  IADLs: Light housekeeping: washes dishes when he is feeling strong, however frequent onset of weakness Meal Prep: Pt was primary cook prior to fall in 2023, but has returned to participation in aspects of meal prep Community mobility: recently renewed drivers license, but is not driving  MOBILITY STATUS: Needs Assist: CGA with mobility with RW and Hx of falls   POSTURE COMMENTS:  rounded shoulders and forward head, posterior pelvic tilt and R lean  FUNCTIONAL OUTCOME MEASURES: Fastening/unfastening 3 buttons: unable to button any buttons in 1 mins Physical performance test: PPT#2 (simulated eating) 22.79 sec & PPT#4 (donning/doffing jacket): TBD  COORDINATION: Box and Blocks:  Right 30 blocks, Left 32 blocks  06/09/23: R: 39 blocks, L: 38 blocks  UE ROM:  all movements are slowed, decreased shoulder flexion d/t ankylosing spondylitis   UE MMT:    grossly 4/5 overall  COGNITION: Overall cognitive status:  Spouse reports thinking continues to be slower  OBSERVATIONS: Bradykinesia   TODAY'S TREATMENT:                                                                         DATE:  06/16/23 Dressing: engaged in problem solving LB dressing as pt continues to have difficulty with donning Depends and pants.  OT educating on rolling over waist  band to decrease amount of fabric when putting feet into foot holes, providing demonstration with pillow case.  OT reiterated donning "harder" side first, reiterating education on use of step stool vs holding leg with same arm vs figure 4.  Pt able to complete figure 4 easily with RLE, however with most difficulty reaching to LLE when on step stool and decreased ability to complete figure 4.  Engaged in simulated LB dressing with gathering up waist band, threading BLE into pants legs (theraband), and standing to pull up pants.  Pt still with difficulty advancing simulated pants over LLE and then difficulty when standing to pull pants over hips due to decreased balance and difficulty with weight shifting. Engaged in brief discussion about use of magnetic buttons for increased ease with aspects of dressing.   06/11/23 Supine ROM: engaged  in shoulder flexion and abduction stretch in supine to allow for increased scapular and spine support and gravity-minimized/eliminated positioning. OT utilizing tactile cues and targets to facilitate increased reaching overhead and out to side.  OT facilitating increased reach with tactile cues along elbow of LUE.  Utilized dowel in BUE with shoulder flexion to facilitate increased ROM with inconsistent results. Bed mobility: pt requiring use of momentum for sit > supine as well as supine > sit but able to complete without physical assistance. UE ROM: engaged in reaching for cones in sitting with focus on increased ROM and active reach out to side and across midline to place onto target.  Pt completing bilaterally with increased effort on L. Encouraged placement of clothing or other items just outside of reach (when in safe sitting position) to facilitate increased reach and weight shifting.   06/09/23 Large amplitude: engaged in modified LSVT big exercises to carry over to decreased burden of care with transitional movements and dressing.  Pt demonstrating improved forward  and downward reach post exercises which may aid in increased independence with LB dressing.  OT providing verbal cues for upright posture when reaching up and out to side.  Pt still with decreased ROM on L, therefore OT providing intermittent tactile cues to facilitate increased ROM as able. Sit> stand: engaged in massed practice with sit > stand with focus on foot placement and anterior weight shift.  Pt demonstrating improvements with sit > stand with staggered foot placement as well. R: 39 blocks, L: 38 blocks  PATIENT EDUCATION: Education details: ongoing condition specific education, community support Person educated: Patient and Spouse Education method: Chief Technology Officer Education comprehension: verbalized understanding and needs further education  HOME EXERCISE PROGRAM: LSVT big exercises  GOALS: Goals reviewed with patient? Yes  SHORT TERM GOALS: Target date: 05/23/23  Pt will be independent in full body HEP with focus on large amplitude movements as needed to increase independence with ADLs.  Baseline: Goal status: Not met - 05/26/23  2.  Pt will perform dynamic standing task for 5 mins as needed for simple IADLs w/o LOB using DME and/or countertop support prn  Baseline:  Goal status: IN PROGRESS  3.  Pt will demonstrate improved ease with sit > stand to get up from toilet or other seating options in home by improving score on sit > stand to decreased fall risk.  Baseline: time TBD Goal status: IN PROGRESS  4.  Pt will demonstrate improved sitting balance and trunk control to reach towards floor to retreive items to allow progression towards LB dressing. Baseline:  Goal status: MET - 05/28/23   LONG TERM GOALS: Target date: 06/20/23  Pt will complete UB dressing, to include clothing fasteners, at Mod I level with use of AE and/or alternative strategies PRN.  Baseline:  Goal status: IN PROGRESS  2.  Pt will be able to don socks and shoes with Supervision/setup  with use of AE and/or alternative strategies PRN.  Baseline:  Goal status: IN PROGRESS  3.  Pt will demonstrate increased ease with dressing as evidenced by decreasing PPT#4(don/ doff jacket) by 10 secs or more.  Baseline: time TBD Goal status: IN PROGRESS  4.  Pt will demonstrate improved UE functional use for ADLs as evidenced by increasing box/ blocks score by 4 blocks with BUE.  Baseline:  Right 30 blocks, Left 32 blocks Goal status: MET - R: 39, L: 38 blocks on 06/09/23  5.  Pt will demonstrate improved dynamic standing balance for  10 mins as needed to engage in ADLs/IADLs with increased safety/endurance.  Baseline:  Goal status: IN PROGRESS  LONG TERM GOALS: Target date: 06/20/23    ASSESSMENT:  CLINICAL IMPRESSION: Dressing continues to be a large challenge for pt, with pt reporting that it varies from day to day.  Clothing fasteners on jeans and shirt are quite difficult, discussed option of magnetic fasteners.  Pt continues with decreased sit > stand and standing balance further challenging ease with getting pants up/down over hips and managing pants button.   PERFORMANCE DEFICITS: in functional skills including ADLs, IADLs, coordination, dexterity, ROM, strength, pain, flexibility, Fine motor control, Gross motor control, balance, body mechanics, endurance, decreased knowledge of precautions, decreased knowledge of use of DME, and UE functional use and psychosocial skills including coping strategies, environmental adaptation, and routines and behaviors.   IMPAIRMENTS: are limiting patient from ADLs and IADLs.    PLAN:  OT FREQUENCY: 2x/week  OT DURATION: 8 weeks  PLANNED INTERVENTIONS: self care/ADL training, therapeutic exercise, therapeutic activity, neuromuscular re-education, balance training, functional mobility training, ultrasound, compression bandaging, moist heat, cryotherapy, patient/family education, cognitive remediation/compensation, psychosocial skills  training, energy conservation, coping strategies training, and DME and/or AE instructions  RECOMMENDED OTHER SERVICES: NA  CONSULTED AND AGREED WITH PLAN OF CARE: Patient  PLAN FOR NEXT SESSION:  review bag exercises for carryover to dressing, problem solve clothing fasteners, sit > stand, LB dressing.   Rosalio Loud, OTR/L 06/17/2023, 11:23 AM

## 2023-06-16 NOTE — Therapy (Deleted)
.  oprc 

## 2023-06-17 DIAGNOSIS — R351 Nocturia: Secondary | ICD-10-CM | POA: Diagnosis not present

## 2023-06-17 DIAGNOSIS — R350 Frequency of micturition: Secondary | ICD-10-CM | POA: Diagnosis not present

## 2023-06-17 DIAGNOSIS — G20B2 Parkinson's disease with dyskinesia, with fluctuations: Secondary | ICD-10-CM | POA: Diagnosis not present

## 2023-06-17 DIAGNOSIS — Z515 Encounter for palliative care: Secondary | ICD-10-CM | POA: Diagnosis not present

## 2023-06-17 DIAGNOSIS — R3915 Urgency of urination: Secondary | ICD-10-CM | POA: Diagnosis not present

## 2023-06-17 DIAGNOSIS — M459 Ankylosing spondylitis of unspecified sites in spine: Secondary | ICD-10-CM | POA: Diagnosis not present

## 2023-06-17 NOTE — Patient Outreach (Signed)
Care Coordination   Follow Up Visit Note   06/16/2023 Name: [name] MRN: [id number] DOB: May 24, 1936  [name] is a 87 y.o. year old male who sees Garlan Fillers, MD for primary care. I  engaged with [employer] staff  What matters to the patients health and wellness today?  Patient and spouse was not engaged during this encounter. LCSW left message with an update with patient's spouse     SDOH assessments and interventions completed:  No     Care Coordination Interventions:  Yes, provided  Interventions Today    Flowsheet Row Most Recent Value  Chronic Disease   Chronic disease during today's visit Hypertension (HTN), Other  [Hx of TIA]  General Interventions   General Interventions Discussed/Reviewed Communication with  Communication with PCP/Specialists  [LCSW left a detailed message for PCP's nurse, Makayla, regarding pt's prescription for a bedside commode. Requested a return call.]       Follow up plan:  LCSW will await a return call    Encounter Outcome:  Patient Visit Completed   [name], MSW, LCSW [facility] Care Management [facility]  [facility] [email or web address] Phone [phone] 8:29 AM

## 2023-06-19 ENCOUNTER — Telehealth: Payer: Self-pay | Admitting: Licensed Clinical Social Worker

## 2023-06-19 ENCOUNTER — Ambulatory Visit: Payer: Medicare Other

## 2023-06-19 ENCOUNTER — Ambulatory Visit: Payer: Medicare Other | Admitting: Occupational Therapy

## 2023-06-19 DIAGNOSIS — R293 Abnormal posture: Secondary | ICD-10-CM

## 2023-06-19 DIAGNOSIS — M6281 Muscle weakness (generalized): Secondary | ICD-10-CM | POA: Diagnosis not present

## 2023-06-19 DIAGNOSIS — R29818 Other symptoms and signs involving the nervous system: Secondary | ICD-10-CM

## 2023-06-19 DIAGNOSIS — R278 Other lack of coordination: Secondary | ICD-10-CM | POA: Diagnosis not present

## 2023-06-19 DIAGNOSIS — R2681 Unsteadiness on feet: Secondary | ICD-10-CM

## 2023-06-19 DIAGNOSIS — R262 Difficulty in walking, not elsewhere classified: Secondary | ICD-10-CM

## 2023-06-19 DIAGNOSIS — G20A2 Parkinson's disease without dyskinesia, with fluctuations: Secondary | ICD-10-CM

## 2023-06-19 DIAGNOSIS — R2689 Other abnormalities of gait and mobility: Secondary | ICD-10-CM | POA: Diagnosis not present

## 2023-06-19 NOTE — Patient Outreach (Signed)
Care Coordination   Follow Up Visit Note   06/19/2023 Name: JERRI VANFOSSEN MRN: [id number] DOB: 06/12/1936  CHING ZAYAS is a 87 y.o. year old male who sees Garlan Fillers, MD for primary care. I spoke with  [name] by phone today.  What matters to the patients health and wellness today?  DME needs    Goals Addressed             This Visit's Progress    Obtain Supportive Resources-Caregiver Strain   On track    Activities and task to complete in order to accomplish goals.   Keep all upcoming appointments discussed today Continue with compliance of taking medication prescribed by Doctor Implement healthy coping skills discussed to assist with management of symptoms         SDOH assessments and interventions completed:  No     Care Coordination Interventions:  Yes, provided  Interventions Today    Flowsheet Row Most Recent Value  Chronic Disease   Chronic disease during today's visit Hypertension (HTN), Other  [Hx of TIA]  General Interventions   General Interventions Discussed/Reviewed General Interventions Reviewed, Durable Medical Equipment (DME), Communication with  [Family reports they have not heard from PCP office regarding DME prescription for bedside commode. LCSW has spoke with [facility] who confirmed they have not received prescription. They sell BSC out of pocket ONLY FOR $42.]  Doctor Visits Discussed/Reviewed Doctor Visits Reviewed  Communication with PCP/Specialists  [LCSW left second detailed message for PCP's nurse Makayla regarding DME prescription]  Mental Health Interventions   Mental Health Discussed/Reviewed Mental Health Reviewed, Coping Strategies  Pharmacy Interventions   Pharmacy Dicussed/Reviewed Pharmacy Topics Reviewed  Safety Interventions   Safety Discussed/Reviewed Safety Reviewed       Follow up plan: Follow up call scheduled for 1 week    Encounter Outcome:  Patient Visit Completed   Jenel Lucks, MSW,  LCSW [facility] Care Management [facility] Health  [facility] [email or web address] Phone [phone] 2:20 PM

## 2023-06-19 NOTE — Patient Instructions (Signed)
Visit Information  Thank you for taking time to visit with me today. Please don't hesitate to contact me if I can be of assistance to you.   Following are the goals we discussed today:   Goals Addressed             This Visit's Progress    Obtain Supportive Resources-Caregiver Strain   On track    Activities and task to complete in order to accomplish goals.   Keep all upcoming appointments discussed today Continue with compliance of taking medication prescribed by Doctor Implement healthy coping skills discussed to assist with management of symptoms         Our next appointment is by telephone on 10/31 at 10:30 AM  Please call the care guide team at [phone] if you need to cancel or reschedule your appointment.   If you are experiencing a Mental Health or Behavioral Health Crisis or need someone to talk to, please call the Suicide and Crisis Lifeline: 988 call 911   Patient verbalizes understanding of instructions and care plan provided today and agrees to view in MyChart. Active MyChart status and patient understanding of how to access instructions and care plan via MyChart confirmed with patient.     Jenel Lucks, MSW, LCSW [facility] Care Management [place]  [facility] [email or web address] Phone [phone] 2:21 PM

## 2023-06-19 NOTE — Therapy (Signed)
OUTPATIENT PHYSICAL THERAPY NEURO TREATMENT NOTE, Progress Note, Recertification   Patient Name: [name] MRN: [id number] DOB:[birth year], 87 y.o., male Today's Date: 06/19/2023   PCP: Garlan Fillers REFERRING PROVIDER: Huston Foley, MD  Progress Note Reporting Period 04/29/23 to 06/19/23  See note below for Objective Data and Assessment of Progress/Goals.     END OF SESSION:  PT End of Session - 06/19/23 1439     Visit Number 14    Number of Visits 22   recert completed 04/29/2023   Date for PT Re-Evaluation 07/17/23    Authorization Type Medicare/[employer]-NEEDS KX due to previous PT and speech this year    Progress Note Due on Visit 24    PT Start Time 1445    PT Stop Time 1530    PT Time Calculation (min) 45 min    Equipment Utilized During Treatment Gait belt    Activity Tolerance Patient tolerated treatment well    Behavior During Therapy WFL for tasks assessed/performed                       Past Medical History:  Diagnosis Date   Ankylosing spondylitis (HCC) dx'd ~ 1974   Arthritis    "back" (08/10/2015)   BENIGN PROSTATIC HYPERTROPHY, WITH OBSTRUCTION 01/15/2010   Bleeding duodenal ulcer    Bleeding esophageal ulcer    Bleeding stomach ulcer    GERD 02/22/2008   History of blood transfusion 1988   "lost ~ 1/2 of my blood volume from multiple bleeding ulcers"   History of hiatal hernia    HYPERGLYCEMIA 11/18/2007   HYPERLIPIDEMIA 02/22/2008   HYPERTENSION, UNSPECIFIED 11/10/2009   Kidney stones    Osteoarthritis    c-spine   PEPTIC ULCER DISEASE 11/17/2007   Situational depression    "son died in MVA Jun 24, 2015"   TIA (transient ischemic attack)    "not that I know of in the past; they are trying to determine if I've had one today (08/10/2015)"   Past Surgical History:  Procedure Laterality Date   CATARACT EXTRACTION W/ INTRAOCULAR LENS  IMPLANT, BILATERAL Bilateral 2013   Patient Active Problem List   Diagnosis Date Noted   Ankylosing  spondylitis of cervicothoracic region (HCC) 10/16/2016   TIA (transient ischemic attack) 08/10/2015   Carotid stenosis 08/04/2014   Hyperlipidemia 02/21/2012   BENIGN PROSTATIC HYPERTROPHY, WITH OBSTRUCTION 01/15/2010   Essential hypertension 11/10/2009   GERD 02/22/2008   NEPHROLITHIASIS, HX OF 11/17/2007    ONSET DATE: "several years"  REFERRING DIAG: G20.A2 (ICD-10-CM) - Parkinson's disease with fluctuating manifestations, unspecified whether dyskinesia present R26.81 (ICD-10-CM) - Unsteadiness on feet R41.841 (ICD-10-CM) - Cognitive communication deficit R27.8 (ICD-10-CM) - Other lack of coordination R26.2 (ICD-10-CM) - Difficulty in walking, not elsewhere classified  THERAPY DIAG:  Other symptoms and signs involving the nervous system  Unsteadiness on feet  Muscle weakness (generalized)  Abnormal posture  Other abnormalities of gait and mobility  Parkinson's disease with fluctuating manifestations, unspecified whether dyskinesia present (HCC)  Difficulty in walking, not elsewhere classified  Rationale for Evaluation and Treatment: Rehabilitation  SUBJECTIVE:  SUBJECTIVE STATEMENT: Had a fall at home several days ago when got up to use restroom in the night and fell taking a turn around corner  Pt accompanied by: significant other  PERTINENT HISTORY: PD, complex medical history of peptic ulcer disease with history of upper GI bleed, TIA, depression, degenerative disc disease in the neck, osteoarthritis, kidney stones, hyperlipidemia, hypertension, history of hiatal hernia, BPH, arthritis, and ankylosing spondylitis, and parkinsonism   PAIN:  Are you having pain? Report mild pain in the R groin with Nustep   PRECAUTIONS: Fall  RED FLAGS: None   WEIGHT BEARING RESTRICTIONS:  No  FALLS: Has patient fallen in last 6 months? Yes. Number of falls unknown, multiple  LIVING ENVIRONMENT: Lives with: lives with their spouse Lives in: House/apartment Stairs: Ramp to enter, stairs to loft and basement, ground floor set-up Has following equipment at home: Dan Humphreys - 2 wheeled  PLOF: Needs assistance with ADLs, Needs assistance with homemaking, and Needs assistance with gait, supervision for ambulation at household distances  PATIENT GOALS: improve balance, walking, reduce falls  OBJECTIVE:   TODAY'S TREATMENT: 06/18/24 Activity Comments  Seated knee extension 1x10 Physical target for leg height  Seated PWR up 1x10 100% cues for sequence  5xSTS 1 min 18, 2 instances retro-LOB  TUG test 54 sec w/ RW; 10 steps to turn  Gait Supervision level surfaces w/ RW x 150 ft in 2 min. 45 sec = 0.9 ft/sec  NU-step resistance 5 x 9 min Cues for ROM and speed of 60-80 SPM for improved rapid alternating movement/conditioning      TODAY'S TREATMENT: 06/16/23 Activity Comments  Gait with RW from mat to nustep; cues for large steps, min A for STS   nustep intervals L3 x 2 min, L6 x1 min,  L3 x 2 min Cueing for large amplitude movement throughout   review of seated HEP: LAQ #2 10x each  sitting march #2 20x  sitting forward trunk leans 10x  Cues to sit up tall, large amplitude ; had pt count out loud for increased participation. Pt appears sleepy and reduced amplitude with increased reps   STS x3 All required at least min A  gait training with U step and 4WW 2x110ft Cueing to "kick" to take a step; occasional resetting balance to prevent retropulsion              Access Code: [email or web address] URL: [email or web address] Date: 05/08/2023 Prepared by: [facility] - Outpatient  Rehab - [facility]  Program Notes perform with wife's supervision  Exercises - Mini Squat with Counter Support  - 1 x daily - 5 x weekly - 2 sets - 10 reps - Staggered Stance Forward  Backward Weight Shift with Counter Support  - 1 x daily - 5 x weekly - 2 sets - 10 reps - Standing Quarter Turn with Counter Support  - 1 x daily - 5 x weekly - 2 sets - 5 reps - Seated Long Arc Quad with Ankle Weight  - 1 x daily - 5 x weekly - 3 sets - 10 reps - Seated Hip Flexion March with Ankle Weights  - 1 x daily - 5 x weekly - 3 sets - 10 reps - Seated Active Hip Flexion  - 1 x daily - 7 x weekly - 1 sets - 10 reps - 5-10 sec hold        ------------------------------------------------- Objective measures below taken at initial evaluation:  DIAGNOSTIC FINDINGS: n/a for episode  COGNITION: Overall cognitive status: History  of cognitive impairments - at baseline   SENSATION: Neuropathy in bilat feet  COORDINATION: Difficulty with rapid alternating movements    MUSCLE TONE: NT   POSTURE: rounded shoulders, forward head, flexed trunk , and head down , kyphosis  LOWER EXTREMITY ROM:     Active  Right Eval Left Eval  Hip flexion    Hip extension    Hip abduction    Hip adduction    Hip internal rotation    Hip external rotation    Knee flexion 120 120  Knee extension 0 0  Ankle dorsiflexion 5 12  Ankle plantarflexion    Ankle inversion    Ankle eversion     (Blank rows = not tested)  LOWER EXTREMITY MMT:    BLE strength grossly 3+/5  BED MOBILITY:  Mod A  TRANSFERS: Assistive device utilized: Environmental consultant - 2 wheeled  Sit to stand: CGA and Min A Stand to sit: SBA Chair to chair: SBA and CGA Floor: Mod A and Max A  RAMP:  Level of Assistance: CGA Assistive device utilized: Environmental consultant - 2 wheeled Ramp Comments:   CURB:  Level of Assistance: CGA Assistive device utilized: Environmental consultant - 2 wheeled Curb Comments:   STAIRS: NT  GAIT: Gait pattern: step to pattern, decreased stride length, and poor foot clearance- Right Distance walked: 100 Assistive device utilized: Walker - 2 wheeled Level of assistance: SBA and CGA Comments: level  surfaces  FUNCTIONAL TESTS:  5 times sit to stand: 45 sec w/ CGA Timed up and go (TUG): 1 min 27 sec  M-CTSIB  Condition 1: Firm Surface, EO 10 Sec, Severe and retro-LOB  Sway  Condition 2: Firm Surface, EC 3 Sec, Severe and retro-LOB  Sway  Condition 3: Foam Surface, EO  Sec,  Sway  Condition 4: Foam Surface, EC  Sec,  Sway       GOALS: Goals reviewed with patient? Yes  SHORT TERM GOALS: UPDATED Target date: 05/15/2023>05/23/2023    Patient will be independent in HEP to improve functional outcomes Baseline: has been reviewed with PT and wife previously 06/04/23 Goal status: MET 06/04/23  2.  Teach-back strategies to reduce freezing of gait during turns Baseline: 16 steps to complete 180 deg turn; unable to recall 06/04/23 Goal status: NOT MET 06/04/23  3.  Demo improved safety and independence with sit to stand completing transfers with set-up assist Baseline: CGA-min A; unchanged 06/04/23 Goal status: NOT MET 06/04/23    LONG TERM GOALS: UPDATED Target date: 06/05/2023>06/20/2023> 07/17/2023      Demo reduced risk for falls per time 35 sec TUG test Baseline: 1 min 27 sec; 1 min 15 sec 06/04/23; 54 sec 06/19/23 Goal status: IN PROGRESS 06/04/23  2.  Demo improved BLE strength and balance per time of 25 sec 5xSTS test to increase independence with mobility Baseline: 45 sec; 1 min 33 sec with occasional min A and consistent verbal cues 06/04/23; 1 min 18 sec 06/19/23 Goal status: IN PROGRESS   3.  Demo improved safety with ambulation on level surfaces w/ supervision x 150 ft Baseline: SBA-CGA; occasional min A for balance and cueing to reduce freezing for 160 ft 06/04/23; 150 ft w/ supervision level surfaces x 2 min 45 sec Goal status: MET  4.  Improve gait speed to 1.5 ft/sec to improve efficiency of ambulation  Baseline: 0.9 ft/sec  Goal status: INITIAL  ASSESSMENT:  CLINICAL IMPRESSION: Recertification reassessment performed today with minimal change to 5xSTS test with  instances of retro-grade LOB.  Improvements with TUG test from previous 1.5 min to now 54 sec w/ RW.  Observed to make 180 degree turns in 10 steps vs previous 16 steps.  Able to ambulate on level surfaces w/ supervision at present from previous CGA. Continues to experience freezing of gait and unsteadiness on feet with instances of LOB.  Pt would benefit from continued sessions to progress mobility and reduce risk for falls OBJECTIVE IMPAIRMENTS: Abnormal gait, decreased activity tolerance, decreased balance, decreased coordination, decreased mobility, difficulty walking, decreased ROM, decreased strength, improper body mechanics, and postural dysfunction.   ACTIVITY LIMITATIONS: carrying, lifting, standing, stairs, transfers, bed mobility, and locomotion level  PARTICIPATION LIMITATIONS: meal prep, cleaning, laundry, interpersonal relationship, community activity, and exercise routine  PERSONAL FACTORS: Age, Time since onset of injury/illness/exacerbation, and 3+ comorbidities: PMH  are also affecting patient's functional outcome.   REHAB POTENTIAL: Good  CLINICAL DECISION MAKING: Evolving/moderate complexity  EVALUATION COMPLEXITY: Moderate  PLAN:  PT FREQUENCY: 2x/week  PT DURATION: 4 weeks  PLANNED INTERVENTIONS: Therapeutic exercises, Therapeutic activity, Neuromuscular re-education, Balance training, Gait training, Patient/Family education, Self Care, and Joint mobilization  PLAN FOR NEXT SESSION: Initiate session with Nustep at varied resistance/speeds for high intensity warm-up. review HEP and sit<>stand.  Try again U-step rollator and discuss how to obtain, if we feel this is appropriate.  Next week is LTG check.  Continue to work on staggered foot position and rocking through hips to prevent posterior LOB.   3:31 PM, 06/19/23 M. Shary Decamp, PT, DPT Physical Therapist- [place] Office Number: [phone]

## 2023-06-19 NOTE — Therapy (Signed)
OUTPATIENT OCCUPATIONAL THERAPY PARKINSON'S  Treatment Note & re-certification  Patient Name: [name] MRN: [id number] DOB:May 13, 1936, 87 y.o., male Today's Date: 06/19/2023  PCP: Garlan Fillers, MD REFERRING PROVIDER: Huston Foley, MD     END OF SESSION:  OT End of Session - 06/19/23 1411     Visit Number 12    Number of Visits 20    Date for OT Re-Evaluation 07/18/23    Authorization Type Medicare A &B    OT Start Time 1405    OT Stop Time 1445    OT Time Calculation (min) 40 min                        Past Medical History:  Diagnosis Date   Ankylosing spondylitis (HCC) dx'd ~ 1974   Arthritis    "back" (08/10/2015)   BENIGN PROSTATIC HYPERTROPHY, WITH OBSTRUCTION 01/15/2010   Bleeding duodenal ulcer    Bleeding esophageal ulcer    Bleeding stomach ulcer    GERD 02/22/2008   History of blood transfusion 1988   "lost ~ 1/2 of my blood volume from multiple bleeding ulcers"   History of hiatal hernia    HYPERGLYCEMIA 11/18/2007   HYPERLIPIDEMIA 02/22/2008   HYPERTENSION, UNSPECIFIED 11/10/2009   Kidney stones    Osteoarthritis    c-spine   PEPTIC ULCER DISEASE 11/17/2007   Situational depression    "son died in MVA 06-19-2015"   TIA (transient ischemic attack)    "not that I know of in the past; they are trying to determine if I've had one today (08/10/2015)"   Past Surgical History:  Procedure Laterality Date   CATARACT EXTRACTION W/ INTRAOCULAR LENS  IMPLANT, BILATERAL Bilateral 2013   Patient Active Problem List   Diagnosis Date Noted   Ankylosing spondylitis of cervicothoracic region (HCC) 10/16/2016   TIA (transient ischemic attack) 08/10/2015   Carotid stenosis 08/04/2014   Hyperlipidemia 02/21/2012   BENIGN PROSTATIC HYPERTROPHY, WITH OBSTRUCTION 01/15/2010   Essential hypertension 11/10/2009   GERD 02/22/2008   NEPHROLITHIASIS, HX OF 11/17/2007    ONSET DATE: referral date 03/25/23  REFERRING DIAG: G20.A2 (ICD-10-CM) -  Parkinson's disease without dyskinesia, with fluctuations R26.81 (ICD-10-CM) - Unsteadiness on feet R41.841 (ICD-10-CM) - Cognitive communication deficit R27.8 (ICD-10-CM) - Other lack of coordination R26.2 (ICD-10-CM) - Difficulty in walking, not elsewhere classified  THERAPY DIAG:  Other symptoms and signs involving the nervous system  Unsteadiness on feet  Muscle weakness (generalized)  Other lack of coordination  Abnormal posture  Rationale for Evaluation and Treatment: Rehabilitation  SUBJECTIVE:   SUBJECTIVE STATEMENT: Pt's spouse reports the appt on Tues was much more beneficial than previous contact. Pt accompanied by: self and significant other (spouse, [name])  PERTINENT HISTORY: history of Parkinson's and ankylosing spondylitis   PRECAUTIONS: Fall  WEIGHT BEARING RESTRICTIONS: No  PAIN:  Are you having pain? "Not more than usual"  generalized pain, unrated    FALLS: Has patient fallen in last 6 months? Yes. Number of falls has had multiple falls over the last few months with multiple in early July, and then 2 falls this month.  LIVING ENVIRONMENT: Lives with: lives with their spouse Lives in: House/apartment Stairs: Yes: External: 2-3 steps; has a ramp at entrance with raised railings Internal: steps to basement and steps to loft - however pt does not need to go up/down those at this time. Has following equipment at home: Dan Humphreys - 2 wheeled, Walker - 4 wheeled, bed side commode, [employer]  chair, Ramped entry, and transport chair  PLOF: Requires assistive device for independence and Needs assistance with ADLs  PATIENT GOALS: to be able to move without concern/serious concern  OBJECTIVE:   HAND DOMINANCE: Right  ADLs: Overall ADLs: Pt reports there are times that he can be "fairly independent" with dressing but there are times where spouse has to provide significant assistance with. Transfers/ambulation related to ADLs: Utilizing RW and will occasionally walk  bouts through the house without RW.  If pt loses balance backwards he is unable to correct. Eating: "fumble fingered" UB Dressing: occasional assistance LB Dressing: socks and shoes are quite difficult Toileting: "I go to the bathroom by myself" but has difficulty getting up/down from toilet Bathing: washing at the sink, spouse will assist  Tub Shower transfers: pt feels uncomfortable in the shower with shower chair, but has not fallen Equipment: Shower seat with back and Walk in shower with 5.5" ledge with 30" door  IADLs: Light housekeeping: washes dishes when he is feeling strong, however frequent onset of weakness Meal Prep: Pt was primary cook prior to fall in 2023, but has returned to participation in aspects of meal prep Community mobility: recently renewed drivers license, but is not driving  MOBILITY STATUS: Needs Assist: CGA with mobility with RW and Hx of falls   POSTURE COMMENTS:  rounded shoulders and forward head, posterior pelvic tilt and R lean  FUNCTIONAL OUTCOME MEASURES: Fastening/unfastening 3 buttons: unable to button any buttons in 1 mins Physical performance test: PPT#2 (simulated eating) 22.79 sec & PPT#4 (donning/doffing jacket): TBD  COORDINATION: Box and Blocks:  Right 30 blocks, Left 32 blocks  06/09/23: R: 39 blocks, L: 38 blocks  UE ROM:  all movements are slowed, decreased shoulder flexion d/t ankylosing spondylitis   UE MMT:    grossly 4/5 overall  COGNITION: Overall cognitive status:  Spouse reports thinking continues to be slower  OBSERVATIONS: Bradykinesia   TODAY'S TREATMENT:                                                                         DATE:  06/19/23 Dynamic reaching: engaged in reaching forward, across midline, and incorporating reaching lower and then up to simulate functional reach during grooming and LB dressing tasks.  Progressed from completing in sitting to completing in standing.  Pt did stand 10 mins with supervision  during dynamic reaching. LB dressing: simulated LB dressing with gait belt.  Pt with improved "large amplitude" with dressing one leg but still with small movements requiring cues for big amplitude.  Educated on functional carryover of exercises to dressing.  Reiterated bag exercise with LB dressing with plan to continue to address.   06/16/23 Dressing: engaged in problem solving LB dressing as pt continues to have difficulty with donning Depends and pants.  OT educating on rolling over waist band to decrease amount of fabric when putting feet into foot holes, providing demonstration with pillow case.  OT reiterated donning "harder" side first, reiterating education on use of step stool vs holding leg with same arm vs figure 4.  Pt able to complete figure 4 easily with RLE, however with most difficulty reaching to LLE when on step stool and decreased ability to complete figure 4.  Engaged in simulated LB dressing with gathering up waist band, threading BLE into pants legs (theraband), and standing to pull up pants.  Pt still with difficulty advancing simulated pants over LLE and then difficulty when standing to pull pants over hips due to decreased balance and difficulty with weight shifting. Engaged in brief discussion about use of magnetic buttons for increased ease with aspects of dressing.   06/11/23 Supine ROM: engaged in shoulder flexion and abduction stretch in supine to allow for increased scapular and spine support and gravity-minimized/eliminated positioning. OT utilizing tactile cues and targets to facilitate increased reaching overhead and out to side.  OT facilitating increased reach with tactile cues along elbow of LUE.  Utilized dowel in BUE with shoulder flexion to facilitate increased ROM with inconsistent results. Bed mobility: pt requiring use of momentum for sit > supine as well as supine > sit but able to complete without physical assistance. UE ROM: engaged in reaching for cones in  sitting with focus on increased ROM and active reach out to side and across midline to place onto target.  Pt completing bilaterally with increased effort on L. Encouraged placement of clothing or other items just outside of reach (when in safe sitting position) to facilitate increased reach and weight shifting.   PATIENT EDUCATION: Education details: ongoing condition specific education, community support Person educated: Patient and Spouse Education method: Chief Technology [employer] Education comprehension: verbalized understanding and needs further education  HOME EXERCISE PROGRAM: LSVT big exercises  GOALS: Goals reviewed with patient? Yes  SHORT TERM GOALS: Target date: 05/23/23  Pt will be independent in full body HEP with focus on large amplitude movements as needed to increase independence with ADLs.  Baseline: Goal status: Not met - 05/26/23  2.  Pt will perform dynamic standing task for 5 mins as needed for simple IADLs w/o LOB using DME and/or countertop support prn  Baseline:  Goal status: IN PROGRESS  3.  Pt will demonstrate improved ease with sit > stand to get up from toilet or other seating options in home by improving score on sit > stand to decreased fall risk.  Baseline: time TBD Goal status: IN PROGRESS  4.  Pt will demonstrate improved sitting balance and trunk control to reach towards floor to retreive items to allow progression towards LB dressing. Baseline:  Goal status: MET - 05/28/23   LONG TERM GOALS: Target date: 06/20/23  Pt will complete UB dressing, to include clothing fasteners, at Mod I level with use of AE and/or alternative strategies PRN.  Baseline:  Goal status: Not met - still requiring min assist 06/19/23  2.  Pt will be able to don socks and shoes with Supervision/setup with use of AE and/or alternative strategies PRN.  Baseline:  Goal status: Not met - still requiring min assist and significantly increased time 06/19/23  3.  Pt will  demonstrate increased ease with dressing as evidenced by decreasing PPT#4(don/ doff jacket) by 10 secs or more.  Baseline: 1:05 on 05/12/23 Goal status: Not met - has been addressed but not timed recently  4.  Pt will demonstrate improved UE functional use for ADLs as evidenced by increasing box/ blocks score by 4 blocks with BUE.  Baseline:  Right 30 blocks, Left 32 blocks Goal status: MET - R: 39, L: 38 blocks on 06/09/23  5.  Pt will demonstrate improved dynamic standing balance for 10 mins as needed to engage in ADLs/IADLs with increased safety/endurance.  Baseline:  Goal status: MET -  06/19/23  LONG TERM GOALS: Target date: 07/18/23  Pt will complete UB dressing at Supervision/setup level with use of AE and/or alternative strategies PRN.  Baseline: Min A Goal status: REVISED  2.  Pt will be able to don pants, socks, and shoes with Supervision/setup with use of AE and/or alternative strategies PRN.  Baseline:  Goal status: REVISED  3.  Pt will demonstrate increased ease with dressing as evidenced by decreasing PPT#4(don/ doff jacket) by 10 secs or more.  Baseline: 1:05 Goal status: ONGOING  4.  Pt and spouse will be independent in PD specific HEP to facilitate increased ROM and amplitude as needed for ADLs and quality of life.  Baseline:  Goal status: INITIAL   ASSESSMENT:  CLINICAL IMPRESSION: Dressing continues to be a large challenge for pt, with pt reporting that it varies from day to day.  Pt also continues to demonstrate freezing with aspects of simulated dressing and difficulty with carry over from simulated tasks to functional tasks at home.  Pt demonstrating good standing balance during dynamic reaching and sit > stand this session, however is quite hesitant with further dynamic reaching. Pt will continue to benefit from OT services to address ROM and amplitude as needed for increased ease with ADLs and quality of life.  PERFORMANCE DEFICITS: in functional skills  including ADLs, IADLs, coordination, dexterity, ROM, strength, pain, flexibility, Fine motor control, Gross motor control, balance, body mechanics, endurance, decreased knowledge of precautions, decreased knowledge of use of DME, and UE functional use and psychosocial skills including coping strategies, environmental adaptation, and routines and behaviors.   IMPAIRMENTS: are limiting patient from ADLs and IADLs.    PLAN:  OT FREQUENCY: 2x/week  OT DURATION: 4 weeks  PLANNED INTERVENTIONS: self care/ADL training, therapeutic exercise, therapeutic activity, neuromuscular re-education, balance training, functional mobility training, ultrasound, compression bandaging, moist heat, cryotherapy, patient/family education, cognitive remediation/compensation, psychosocial skills training, energy conservation, coping strategies training, and DME and/or AE instructions  RECOMMENDED OTHER SERVICES: NA  CONSULTED AND AGREED WITH PLAN OF CARE: Patient  PLAN FOR NEXT SESSION:  review bag exercises for carryover to dressing, problem solve clothing fasteners, sit > stand, LB dressing.   Rosalio Loud, OTR/L 06/19/2023, 2:12 PM   [facility] at [facility] [address] [place], [address] [place], [place] [address] Phone # [phone] Fax # [phone]

## 2023-06-23 ENCOUNTER — Ambulatory Visit: Payer: Medicare Other | Admitting: Occupational Therapy

## 2023-06-23 ENCOUNTER — Ambulatory Visit: Payer: Medicare Other | Admitting: Physical Therapy

## 2023-06-23 ENCOUNTER — Encounter: Payer: Self-pay | Admitting: Physical Therapy

## 2023-06-23 DIAGNOSIS — M6281 Muscle weakness (generalized): Secondary | ICD-10-CM | POA: Diagnosis not present

## 2023-06-23 DIAGNOSIS — R278 Other lack of coordination: Secondary | ICD-10-CM | POA: Diagnosis not present

## 2023-06-23 DIAGNOSIS — R29818 Other symptoms and signs involving the nervous system: Secondary | ICD-10-CM

## 2023-06-23 DIAGNOSIS — R2689 Other abnormalities of gait and mobility: Secondary | ICD-10-CM | POA: Diagnosis not present

## 2023-06-23 DIAGNOSIS — R2681 Unsteadiness on feet: Secondary | ICD-10-CM

## 2023-06-23 DIAGNOSIS — R293 Abnormal posture: Secondary | ICD-10-CM | POA: Diagnosis not present

## 2023-06-23 NOTE — Therapy (Signed)
OUTPATIENT OCCUPATIONAL THERAPY PARKINSON'S  Treatment Note & re-certification  Patient Name: [name] MRN: [id number] DOB:1935/10/10, 87 y.o., male Today's Date: 06/23/2023  PCP: Garlan Fillers, MD REFERRING PROVIDER: Huston Foley, MD     END OF SESSION:  OT End of Session - 06/23/23 1407     Visit Number 13    Number of Visits 20    Date for OT Re-Evaluation 07/18/23    Authorization Type Medicare A &B    OT Start Time 1402    OT Stop Time 1444    OT Time Calculation (min) 42 min                        Past Medical History:  Diagnosis Date   Ankylosing spondylitis (HCC) dx'd ~ 1974   Arthritis    "back" (08/10/2015)   BENIGN PROSTATIC HYPERTROPHY, WITH OBSTRUCTION 01/15/2010   Bleeding duodenal ulcer    Bleeding esophageal ulcer    Bleeding stomach ulcer    GERD 02/22/2008   History of blood transfusion 1988   "lost ~ 1/2 of my blood volume from multiple bleeding ulcers"   History of hiatal hernia    HYPERGLYCEMIA 11/18/2007   HYPERLIPIDEMIA 02/22/2008   HYPERTENSION, UNSPECIFIED 11/10/2009   Kidney stones    Osteoarthritis    c-spine   PEPTIC ULCER DISEASE 11/17/2007   Situational depression    "son died in MVA July 14, 2015"   TIA (transient ischemic attack)    "not that I know of in the past; they are trying to determine if I've had one today (08/10/2015)"   Past Surgical History:  Procedure Laterality Date   CATARACT EXTRACTION W/ INTRAOCULAR LENS  IMPLANT, BILATERAL Bilateral 2013   Patient Active Problem List   Diagnosis Date Noted   Ankylosing spondylitis of cervicothoracic region (HCC) 10/16/2016   TIA (transient ischemic attack) 08/10/2015   Carotid stenosis 08/04/2014   Hyperlipidemia 02/21/2012   BENIGN PROSTATIC HYPERTROPHY, WITH OBSTRUCTION 01/15/2010   Essential hypertension 11/10/2009   GERD 02/22/2008   NEPHROLITHIASIS, HX OF 11/17/2007    ONSET DATE: referral date 03/25/23  REFERRING DIAG: G20.A2 (ICD-10-CM) -  Parkinson's disease without dyskinesia, with fluctuations R26.81 (ICD-10-CM) - Unsteadiness on feet R41.841 (ICD-10-CM) - Cognitive communication deficit R27.8 (ICD-10-CM) - Other lack of coordination R26.2 (ICD-10-CM) - Difficulty in walking, not elsewhere classified  THERAPY DIAG:  Other symptoms and signs involving the nervous system  Unsteadiness on feet  Muscle weakness (generalized)  Other lack of coordination  Rationale for Evaluation and Treatment: Rehabilitation  SUBJECTIVE:   SUBJECTIVE STATEMENT: Pt reports that he backed into a magazine rack and fell onto the rack but was able to get himself back up.  Pt's spouse reports that they have not gotten any word about an order for [facility]. Pt accompanied by: self and significant other (spouse, [name])  PERTINENT HISTORY: history of Parkinson's and ankylosing spondylitis   PRECAUTIONS: Fall  WEIGHT BEARING RESTRICTIONS: No  PAIN:  Are you having pain? "Not more than usual"  generalized pain, unrated    FALLS: Has patient fallen in last 6 months? Yes. Number of falls has had multiple falls over the last few months with multiple in early July, and then 2 falls this month.  LIVING ENVIRONMENT: Lives with: lives with their spouse Lives in: House/apartment Stairs: Yes: External: 2-3 steps; has a ramp at entrance with raised railings Internal: steps to basement and steps to loft - however pt does not need to go up/down  those at this time. Has following equipment at home: Dan Humphreys - 2 wheeled, Environmental consultant - 4 wheeled, bed side commode, Shower chair, Ramped entry, and transport chair  PLOF: Requires assistive device for independence and Needs assistance with ADLs  PATIENT GOALS: to be able to move without concern/serious concern  OBJECTIVE:   HAND DOMINANCE: Right  ADLs: Overall ADLs: Pt reports there are times that he can be "fairly independent" with dressing but there are times where spouse has to provide significant assistance  with. Transfers/ambulation related to ADLs: Utilizing RW and will occasionally walk bouts through the house without RW.  If pt loses balance backwards he is unable to correct. Eating: "fumble fingered" UB Dressing: occasional assistance LB Dressing: socks and shoes are quite difficult Toileting: "I go to the bathroom by myself" but has difficulty getting up/down from toilet Bathing: washing at the sink, spouse will assist  Tub Shower transfers: pt feels uncomfortable in the shower with shower chair, but has not fallen Equipment: Shower seat with back and Walk in shower with 5.5" ledge with 30" door  IADLs: Light housekeeping: washes dishes when he is feeling strong, however frequent onset of weakness Meal Prep: Pt was primary cook prior to fall in 2023, but has returned to participation in aspects of meal prep Community mobility: recently renewed drivers license, but is not driving  MOBILITY STATUS: Needs Assist: CGA with mobility with RW and Hx of falls   POSTURE COMMENTS:  rounded shoulders and forward head, posterior pelvic tilt and R lean  FUNCTIONAL OUTCOME MEASURES: Fastening/unfastening 3 buttons: unable to button any buttons in 1 mins Physical performance test: PPT#2 (simulated eating) 22.79 sec & PPT#4 (donning/doffing jacket): TBD  COORDINATION: Box and Blocks:  Right 30 blocks, Left 32 blocks  06/09/23: R: 39 blocks, L: 38 blocks  UE ROM:  all movements are slowed, decreased shoulder flexion d/t ankylosing spondylitis   UE MMT:    grossly 4/5 overall  COGNITION: Overall cognitive status:  Spouse reports thinking continues to be slower  OBSERVATIONS: Bradykinesia   TODAY'S TREATMENT:                                                                         DATE:  06/23/23 Large amplitude: engaged in modified LSVT big exercises to carry over to decreased burden of care with transitional movements and dressing. Pt demonstrating improved forward and downward reach when  given targets to reach towards. OT providing verbal cues for upright posture when reaching up and out to side, even placing a wedge to L side to facilitate increased open extension. Pt demonstrating increased amplitude with each movement.  BLE stretch: engaged in piriformis stretch and knee to chest to facilitate increased mobility as needed for LB dressing.  Ot providing demonstration and cues for hip external and internal rotation in figure 4 with sustained hold in figure 4 position 15 sec x5.  Attempted internal rotation x2 bilaterally with decreased understanding, requiring tactile cues to facilitate stretch in this position.  Pt able to demonstrate descent knee to chest movement with sustained hold x5 seconds alternating sides x5.  OT educated on body positioning and back support to facilitate increased hip mobility. LB dressing: reiterated recommendation to sit for LB dressing  as pt still with attempts to complete in standing at bathroom counter.   06/19/23 Dynamic reaching: engaged in reaching forward, across midline, and incorporating reaching lower and then up to simulate functional reach during grooming and LB dressing tasks.  Progressed from completing in sitting to completing in standing.  Pt did stand 10 mins with supervision during dynamic reaching. LB dressing: simulated LB dressing with gait belt.  Pt with improved "large amplitude" with dressing one leg but still with small movements requiring cues for big amplitude.  Educated on functional carryover of exercises to dressing.  Reiterated bag exercise with LB dressing with plan to continue to address.   06/16/23 Dressing: engaged in problem solving LB dressing as pt continues to have difficulty with donning Depends and pants.  OT educating on rolling over waist band to decrease amount of fabric when putting feet into foot holes, providing demonstration with pillow case.  OT reiterated donning "harder" side first, reiterating education on  use of step stool vs holding leg with same arm vs figure 4.  Pt able to complete figure 4 easily with RLE, however with most difficulty reaching to LLE when on step stool and decreased ability to complete figure 4.  Engaged in simulated LB dressing with gathering up waist band, threading BLE into pants legs (theraband), and standing to pull up pants.  Pt still with difficulty advancing simulated pants over LLE and then difficulty when standing to pull pants over hips due to decreased balance and difficulty with weight shifting. Engaged in brief discussion about use of magnetic buttons for increased ease with aspects of dressing.    PATIENT EDUCATION: Education details: ongoing condition specific education, community support Person educated: Patient and Spouse Education method: Chief Technology Officer Education comprehension: verbalized understanding and needs further education  HOME EXERCISE PROGRAM: LSVT big exercises  Access Code: [email or web address] URL: [email or web address] Date: 06/23/2023 Prepared by: [facility] - Outpatient  Rehab - [facility]  Exercises - Seated Figure 4 Piriformis Stretch  - 1 x daily - 10 reps - 15 sec hold - Seated Piriformis Stretch  - 1 x daily - 10 reps - 15 sec hold - Seated Single Knee to Chest  - 1 x daily - 10 reps - 5 sec hold  GOALS: Goals reviewed with patient? Yes  SHORT TERM GOALS: Target date: 05/23/23  Pt will be independent in full body HEP with focus on large amplitude movements as needed to increase independence with ADLs.  Baseline: Goal status: Not met - 05/26/23  2.  Pt will perform dynamic standing task for 5 mins as needed for simple IADLs w/o LOB using DME and/or countertop support prn  Baseline:  Goal status: IN PROGRESS  3.  Pt will demonstrate improved ease with sit > stand to get up from toilet or other seating options in home by improving score on sit > stand to decreased fall risk.  Baseline: time TBD Goal status:  IN PROGRESS  4.  Pt will demonstrate improved sitting balance and trunk control to reach towards floor to retreive items to allow progression towards LB dressing. Baseline:  Goal status: MET - 05/28/23   LONG TERM GOALS: Target date: 07/18/23  Pt will complete UB dressing at Supervision/setup level with use of AE and/or alternative strategies PRN.  Baseline: Min A Goal status: IN PROGRESS  2.  Pt will be able to don pants, socks, and shoes with Supervision/setup with use of AE and/or alternative strategies PRN.  Baseline:  Goal status: IN  PROGRESS  3.  Pt will demonstrate increased ease with dressing as evidenced by decreasing PPT#4(don/ doff jacket) by 10 secs or more.  Baseline: 1:05 Goal status: IN PROGRESS  4.  Pt and spouse will be independent in PD specific HEP to facilitate increased ROM and amplitude as needed for ADLs and quality of life.  Baseline:  Goal status: IN PROGRESS   ASSESSMENT:  CLINICAL IMPRESSION: Pt with increased shuffling of gait and R lean during ambulation from waiting room to therapy gym.  Pt demonstrating improvements in amplitude during modified LSVT big exercises, still benefiting from visual cues/targets to facilitate increased ROM.  Pt also with improvements in hip flexion and external rotation this session as needed to aid in LB dressing.    PERFORMANCE DEFICITS: in functional skills including ADLs, IADLs, coordination, dexterity, ROM, strength, pain, flexibility, Fine motor control, Gross motor control, balance, body mechanics, endurance, decreased knowledge of precautions, decreased knowledge of use of DME, and UE functional use and psychosocial skills including coping strategies, environmental adaptation, and routines and behaviors.   IMPAIRMENTS: are limiting patient from ADLs and IADLs.    PLAN:  OT FREQUENCY: 2x/week  OT DURATION: 4 weeks  PLANNED INTERVENTIONS: self care/ADL training, therapeutic exercise, therapeutic activity,  neuromuscular re-education, balance training, functional mobility training, ultrasound, compression bandaging, moist heat, cryotherapy, patient/family education, cognitive remediation/compensation, psychosocial skills training, energy conservation, coping strategies training, and DME and/or AE instructions  RECOMMENDED OTHER SERVICES: NA  CONSULTED AND AGREED WITH PLAN OF CARE: Patient  PLAN FOR NEXT SESSION:  review bag exercises for carryover to dressing, problem solve clothing fasteners, sit > stand, LB dressing.   Rosalio Loud, OTR/L 06/23/2023, 2:07 PM   [facility] at [facility] [address] [place], [address] [place], [place] [address] Phone # [phone] Fax # [phone]

## 2023-06-23 NOTE — Therapy (Signed)
OUTPATIENT PHYSICAL THERAPY NEURO TREATMENT NOTE  Patient Name: [name] MRN: [id number] DOB:12-09-1935, 87 y.o., male Today's Date: 06/23/2023   PCP: Garlan Fillers REFERRING PROVIDER: Huston Foley, MD     END OF SESSION:  PT End of Session - 06/23/23 1446     Visit Number 15    Number of Visits 22   recert completed 04/29/2023   Date for PT Re-Evaluation 07/17/23    Authorization Type Medicare/[employer]-NEEDS KX due to previous PT and speech this year    Progress Note Due on Visit 24    PT Start Time 1448    PT Stop Time 1530    PT Time Calculation (min) 42 min    Equipment Utilized During Treatment Gait belt    Activity Tolerance Patient tolerated treatment well    Behavior During Therapy WFL for tasks assessed/performed                        Past Medical History:  Diagnosis Date   Ankylosing spondylitis (HCC) dx'd ~ 1974   Arthritis    "back" (08/10/2015)   BENIGN PROSTATIC HYPERTROPHY, WITH OBSTRUCTION 01/15/2010   Bleeding duodenal ulcer    Bleeding esophageal ulcer    Bleeding stomach ulcer    GERD 02/22/2008   History of blood transfusion 1988   "lost ~ 1/2 of my blood volume from multiple bleeding ulcers"   History of hiatal hernia    HYPERGLYCEMIA 11/18/2007   HYPERLIPIDEMIA 02/22/2008   HYPERTENSION, UNSPECIFIED 11/10/2009   Kidney stones    Osteoarthritis    c-spine   PEPTIC ULCER DISEASE 11/17/2007   Situational depression    "son died in MVA 2015/07/14"   TIA (transient ischemic attack)    "not that I know of in the past; they are trying to determine if I've had one today (08/10/2015)"   Past Surgical History:  Procedure Laterality Date   CATARACT EXTRACTION W/ INTRAOCULAR LENS  IMPLANT, BILATERAL Bilateral 2013   Patient Active Problem List   Diagnosis Date Noted   Ankylosing spondylitis of cervicothoracic region (HCC) 10/16/2016   TIA (transient ischemic attack) 08/10/2015   Carotid stenosis 08/04/2014   Hyperlipidemia 02/21/2012    BENIGN PROSTATIC HYPERTROPHY, WITH OBSTRUCTION 01/15/2010   Essential hypertension 11/10/2009   GERD 02/22/2008   NEPHROLITHIASIS, HX OF 11/17/2007    ONSET DATE: "several years"  REFERRING DIAG: G20.A2 (ICD-10-CM) - Parkinson's disease with fluctuating manifestations, unspecified whether dyskinesia present R26.81 (ICD-10-CM) - Unsteadiness on feet R41.841 (ICD-10-CM) - Cognitive communication deficit R27.8 (ICD-10-CM) - Other lack of coordination R26.2 (ICD-10-CM) - Difficulty in walking, not elsewhere classified  THERAPY DIAG:  Unsteadiness on feet  Other abnormalities of gait and mobility  Abnormal posture  Rationale for Evaluation and Treatment: Rehabilitation  SUBJECTIVE:  SUBJECTIVE STATEMENT: Had one fall landing backwards-Lynn didn't know.  Pt accompanied by: significant other  PERTINENT HISTORY: PD, complex medical history of peptic ulcer disease with history of upper GI bleed, TIA, depression, degenerative disc disease in the neck, osteoarthritis, kidney stones, hyperlipidemia, hypertension, history of hiatal hernia, BPH, arthritis, and ankylosing spondylitis, and parkinsonism   PAIN:  Are you having pain? Report mild pain in the R groin with Nustep   PRECAUTIONS: Fall  RED FLAGS: None   WEIGHT BEARING RESTRICTIONS: No  FALLS: Has patient fallen in last 6 months? Yes. Number of falls unknown, multiple  LIVING ENVIRONMENT: Lives with: lives with their spouse Lives in: House/apartment Stairs: Ramp to enter, stairs to loft and basement, ground floor set-up Has following equipment at home: Dan Humphreys - 2 wheeled  PLOF: Needs assistance with ADLs, Needs assistance with homemaking, and Needs assistance with gait, supervision for ambulation at household distances  PATIENT GOALS:  improve balance, walking, reduce falls  OBJECTIVE:    TODAY'S TREATMENT: 06/23/2023 Activity Comments  Gait 170 ft, RW min assist Several episodes of festination with turns; cues for increased step length/foot clearance  Gait 150 ft with U-step RW Min assist-reminder cues to hold handles to activate walker and to use visual cues of laser line  Short distance gait in parallel bars, 10 ft and turn, 8 reps Counting steps-to encourage long strides  Marching forward in parallel bars, then marching forward with pool noodle as tactile cue   Forward walk with "kick" to pool noodle target   Short distance gait with U-step rollator  Cues to count steps  Sit to stand from mat surface x 3, from chair x 2 Min guard and cues for increased forward lean;reset feet/hips once standing.  Wife asks about transfer from toilet, keeping his hands back too far on bars of BSC Trialed 1 UE on mat, 1 UE on RW to simulate, with min assist    PATIENT EDUCATION: Education details: Strategy to lessen festination:  Target of counting steps for less steps/large steps in small spaces at home Person educated: Patient and Spouse Education method: Explanation, Demonstration, and Verbal cues Education comprehension: verbalized understanding and needs further education  Access Code: [email or web address] URL: [email or web address] Date: 05/08/2023 Prepared by: [facility] - Outpatient  Rehab - [facility]  Program Notes perform with wife's supervision  Exercises - Mini Squat with Counter Support  - 1 x daily - 5 x weekly - 2 sets - 10 reps - Staggered Stance Forward Backward Weight Shift with Counter Support  - 1 x daily - 5 x weekly - 2 sets - 10 reps - Standing Quarter Turn with Counter Support  - 1 x daily - 5 x weekly - 2 sets - 5 reps - Seated Long Arc Quad with Ankle Weight  - 1 x daily - 5 x weekly - 3 sets - 10 reps - Seated Hip Flexion March with Ankle Weights  - 1 x daily - 5 x weekly - 3 sets - 10  reps - Seated Active Hip Flexion  - 1 x daily - 7 x weekly - 1 sets - 10 reps - 5-10 sec hold        ------------------------------------------------- Objective measures below taken at initial evaluation:  DIAGNOSTIC FINDINGS: n/a for episode  COGNITION: Overall cognitive status: History of cognitive impairments - at baseline   SENSATION: Neuropathy in bilat feet  COORDINATION: Difficulty with rapid alternating movements    MUSCLE TONE: NT   POSTURE: rounded shoulders, forward  head, flexed trunk , and head down , kyphosis  LOWER EXTREMITY ROM:     Active  Right Eval Left Eval  Hip flexion    Hip extension    Hip abduction    Hip adduction    Hip internal rotation    Hip external rotation    Knee flexion 120 120  Knee extension 0 0  Ankle dorsiflexion 5 12  Ankle plantarflexion    Ankle inversion    Ankle eversion     (Blank rows = not tested)  LOWER EXTREMITY MMT:    BLE strength grossly 3+/5  BED MOBILITY:  Mod A  TRANSFERS: Assistive device utilized: Environmental consultant - 2 wheeled  Sit to stand: CGA and Min A Stand to sit: SBA Chair to chair: SBA and CGA Floor: Mod A and Max A  RAMP:  Level of Assistance: CGA Assistive device utilized: Environmental consultant - 2 wheeled Ramp Comments:   CURB:  Level of Assistance: CGA Assistive device utilized: Environmental consultant - 2 wheeled Curb Comments:   STAIRS: NT  GAIT: Gait pattern: step to pattern, decreased stride length, and poor foot clearance- Right Distance walked: 100 Assistive device utilized: Walker - 2 wheeled Level of assistance: SBA and CGA Comments: level surfaces  FUNCTIONAL TESTS:  5 times sit to stand: 45 sec w/ CGA Timed up and go (TUG): 1 min 27 sec  M-CTSIB  Condition 1: Firm Surface, EO 10 Sec, Severe and retro-LOB  Sway  Condition 2: Firm Surface, EC 3 Sec, Severe and retro-LOB  Sway  Condition 3: Foam Surface, EO  Sec,  Sway  Condition 4: Foam Surface, EC  Sec,  Sway       GOALS: Goals reviewed  with patient? Yes  SHORT TERM GOALS: UPDATED Target date: 05/15/2023>05/23/2023    Patient will be independent in HEP to improve functional outcomes Baseline: has been reviewed with PT and wife previously 06/04/23 Goal status: MET 06/04/23  2.  Teach-back strategies to reduce freezing of gait during turns Baseline: 16 steps to complete 180 deg turn; unable to recall 06/04/23 Goal status: NOT MET 06/04/23  3.  Demo improved safety and independence with sit to stand completing transfers with set-up assist Baseline: CGA-min A; unchanged 06/04/23 Goal status: NOT MET 06/04/23    LONG TERM GOALS: UPDATED Target date: 06/05/2023>06/20/2023> 07/17/2023      Demo reduced risk for falls per time 35 sec TUG test Baseline: 1 min 27 sec; 1 min 15 sec 06/04/23; 54 sec 06/19/23 Goal status: IN PROGRESS 06/04/23  2.  Demo improved BLE strength and balance per time of 25 sec 5xSTS test to increase independence with mobility Baseline: 45 sec; 1 min 33 sec with occasional min A and consistent verbal cues 06/04/23; 1 min 18 sec 06/19/23 Goal status: IN PROGRESS   3.  Demo improved safety with ambulation on level surfaces w/ supervision x 150 ft Baseline: SBA-CGA; occasional min A for balance and cueing to reduce freezing for 160 ft 06/04/23; 150 ft w/ supervision level surfaces x 2 min 45 sec Goal status: MET  4.  Improve gait speed to 1.5 ft/sec to improve efficiency of ambulation  Baseline: 0.9 ft/sec  Goal status: INITIAL  ASSESSMENT:  CLINICAL IMPRESSION: Skilled PT session today focused on gait activities and standing balance/gait strategies in parallel bars.  Trial of U-step walker today again, with pt needing reminder cues today to activate walker by squeezing the handles.  With standing activities in parallel bars, he responds best to counting steps/focus  on taking a targeted number of steps.  This also translates quite well to short distance gait after work in parallel bars.  Discussed with wife  trying to use this counting strategy as a way to take larger/less steps in smaller spaces in home.  He will continue to benefit from further skilled PT to continue to reinforce strategies to improve step length, lessen retropulsion with household gait.  OBJECTIVE IMPAIRMENTS: Abnormal gait, decreased activity tolerance, decreased balance, decreased coordination, decreased mobility, difficulty walking, decreased ROM, decreased strength, improper body mechanics, and postural dysfunction.   ACTIVITY LIMITATIONS: carrying, lifting, standing, stairs, transfers, bed mobility, and locomotion level  PARTICIPATION LIMITATIONS: meal prep, cleaning, laundry, interpersonal relationship, community activity, and exercise routine  PERSONAL FACTORS: Age, Time since onset of injury/illness/exacerbation, and 3+ comorbidities: PMH  are also affecting patient's functional outcome.   REHAB POTENTIAL: Good  CLINICAL DECISION MAKING: Evolving/moderate complexity  EVALUATION COMPLEXITY: Moderate  PLAN:  PT FREQUENCY: 2x/week  PT DURATION: 4 weeks  PLANNED INTERVENTIONS: Therapeutic exercises, Therapeutic activity, Neuromuscular re-education, Balance training, Gait training, Patient/Family education, Self Care, and Joint mobilization  PLAN FOR NEXT SESSION: Standing/gait activities in parallel bars.  Initiate session with Nustep at varied resistance/speeds for high intensity warm-up. review HEP and sit<>stand.  Try again U-step rollator and discuss how to obtain, if we feel this is appropriate.    Continue to work on staggered foot position and rocking through hips to prevent posterior LOB.   Lonia Blood, PT 06/23/23 4:39 PM Phone: [phone] Fax: [phone]  [facility] Re[facility] at [facility] [address] [place], [address] [place], [place] [address] Phone # [phone] Fax # [phone]

## 2023-06-24 DIAGNOSIS — Z23 Encounter for immunization: Secondary | ICD-10-CM | POA: Diagnosis not present

## 2023-06-25 ENCOUNTER — Ambulatory Visit: Payer: Medicare Other | Admitting: Physical Therapy

## 2023-06-25 ENCOUNTER — Encounter: Payer: Self-pay | Admitting: Physical Therapy

## 2023-06-25 ENCOUNTER — Ambulatory Visit: Payer: Medicare Other | Admitting: Occupational Therapy

## 2023-06-25 DIAGNOSIS — R2689 Other abnormalities of gait and mobility: Secondary | ICD-10-CM | POA: Diagnosis not present

## 2023-06-25 DIAGNOSIS — M6281 Muscle weakness (generalized): Secondary | ICD-10-CM

## 2023-06-25 DIAGNOSIS — R2681 Unsteadiness on feet: Secondary | ICD-10-CM | POA: Diagnosis not present

## 2023-06-25 DIAGNOSIS — R29818 Other symptoms and signs involving the nervous system: Secondary | ICD-10-CM

## 2023-06-25 DIAGNOSIS — R278 Other lack of coordination: Secondary | ICD-10-CM

## 2023-06-25 DIAGNOSIS — R293 Abnormal posture: Secondary | ICD-10-CM | POA: Diagnosis not present

## 2023-06-25 NOTE — Patient Instructions (Signed)
Sitting down Feel chair behind BOTH legs Reach back for arm rests Lower self down  Standing up Move bottom forward to edge of chair Tuck feet under, wide base of support Nose over toes Push up to stand

## 2023-06-25 NOTE — Therapy (Signed)
OUTPATIENT PHYSICAL THERAPY NEURO TREATMENT NOTE  Patient Name: [name] MRN: [id number] DOB:09/04/1935, 87 y.o., male Today's Date: 06/26/2023   PCP: Garlan Fillers REFERRING PROVIDER: Huston Foley, MD     END OF SESSION:  PT End of Session - 06/25/23 1452     Visit Number 16    Number of Visits 22    Date for PT Re-Evaluation 07/17/23    Authorization Type Medicare/[employer]-NEEDS KX due to previous PT and speech this year    Progress Note Due on Visit 24    PT Start Time 1450    PT Stop Time 1530    PT Time Calculation (min) 40 min    Equipment Utilized During Treatment Gait belt    Activity Tolerance Patient tolerated treatment well    Behavior During Therapy WFL for tasks assessed/performed                         Past Medical History:  Diagnosis Date   Ankylosing spondylitis (HCC) dx'd ~ 1974   Arthritis    "back" (08/10/2015)   BENIGN PROSTATIC HYPERTROPHY, WITH OBSTRUCTION 01/15/2010   Bleeding duodenal ulcer    Bleeding esophageal ulcer    Bleeding stomach ulcer    GERD 02/22/2008   History of blood transfusion 1988   "lost ~ 1/2 of my blood volume from multiple bleeding ulcers"   History of hiatal hernia    HYPERGLYCEMIA 11/18/2007   HYPERLIPIDEMIA 02/22/2008   HYPERTENSION, UNSPECIFIED 11/10/2009   Kidney stones    Osteoarthritis    c-spine   PEPTIC ULCER DISEASE 11/17/2007   Situational depression    "son died in MVA 2015/07/04"   TIA (transient ischemic attack)    "not that I know of in the past; they are trying to determine if I've had one today (08/10/2015)"   Past Surgical History:  Procedure Laterality Date   CATARACT EXTRACTION W/ INTRAOCULAR LENS  IMPLANT, BILATERAL Bilateral 2013   Patient Active Problem List   Diagnosis Date Noted   Ankylosing spondylitis of cervicothoracic region (HCC) 10/16/2016   TIA (transient ischemic attack) 08/10/2015   Carotid stenosis 08/04/2014   Hyperlipidemia 02/21/2012   BENIGN PROSTATIC  HYPERTROPHY, WITH OBSTRUCTION 01/15/2010   Essential hypertension 11/10/2009   GERD 02/22/2008   NEPHROLITHIASIS, HX OF 11/17/2007    ONSET DATE: "several years"  REFERRING DIAG: G20.A2 (ICD-10-CM) - Parkinson's disease with fluctuating manifestations, unspecified whether dyskinesia present R26.81 (ICD-10-CM) - Unsteadiness on feet R41.841 (ICD-10-CM) - Cognitive communication deficit R27.8 (ICD-10-CM) - Other lack of coordination R26.2 (ICD-10-CM) - Difficulty in walking, not elsewhere classified  THERAPY DIAG:  Unsteadiness on feet  Other abnormalities of gait and mobility  Abnormal posture  Other symptoms and signs involving the nervous system  Rationale for Evaluation and Treatment: Rehabilitation  SUBJECTIVE:  SUBJECTIVE STATEMENT: Had one fall landing backwards-Lynn didn't know.  Pt accompanied by: significant other  PERTINENT HISTORY: PD, complex medical history of peptic ulcer disease with history of upper GI bleed, TIA, depression, degenerative disc disease in the neck, osteoarthritis, kidney stones, hyperlipidemia, hypertension, history of hiatal hernia, BPH, arthritis, and ankylosing spondylitis, and parkinsonism   PAIN:  Are you having pain? Report mild pain in the R groin with Nustep   PRECAUTIONS: Fall  RED FLAGS: None   WEIGHT BEARING RESTRICTIONS: No  FALLS: Has patient fallen in last 6 months? Yes. Number of falls unknown, multiple  LIVING ENVIRONMENT: Lives with: lives with their spouse Lives in: House/apartment Stairs: Ramp to enter, stairs to loft and basement, ground floor set-up Has following equipment at home: Dan Humphreys - 2 wheeled  PLOF: Needs assistance with ADLs, Needs assistance with homemaking, and Needs assistance with gait, supervision for ambulation at  household distances  PATIENT GOALS: improve balance, walking, reduce falls  OBJECTIVE:    TODAY'S TREATMENT: 06/25/2023 Activity Comments  NuStep, Level 5, 4 extremities x 6 minutes 30 sec bouts of SPM >90; base speed is 80-85  In parallel bars:  forward walking with turns Counting steps-pt able to make 5-6 steps  Practiced turns in parallel bars: -marching turns -weightshifting turns -sidestep turns Decreased foot clearance with all turns; cues for technique  Gait with RW, 40 ft x 2 reps Short distance gait, cues provided to take as few steps as possible  Short distance gait with turns Cues to stop RW then turn to face red theraband>move RW/stop>then turn to face band           PATIENT EDUCATION: Education details: Turning strategy and continued to reinforce cues for counting steps for less steps/large steps in small spaces at home Person educated: Patient and Spouse Education method: Explanation, Demonstration, and Verbal cues Education comprehension: verbalized understanding and needs further education  Access Code: [email or web address] URL: [email or web address] Date: 05/08/2023 Prepared by: [facility] - Outpatient  Rehab - [facility]  Program Notes perform with wife's supervision  Exercises - Mini Squat with Counter Support  - 1 x daily - 5 x weekly - 2 sets - 10 reps - Staggered Stance Forward Backward Weight Shift with Counter Support  - 1 x daily - 5 x weekly - 2 sets - 10 reps - Standing Quarter Turn with Counter Support  - 1 x daily - 5 x weekly - 2 sets - 5 reps - Seated Long Arc Quad with Ankle Weight  - 1 x daily - 5 x weekly - 3 sets - 10 reps - Seated Hip Flexion March with Ankle Weights  - 1 x daily - 5 x weekly - 3 sets - 10 reps - Seated Active Hip Flexion  - 1 x daily - 7 x weekly - 1 sets - 10 reps - 5-10 sec hold        ------------------------------------------------- Objective measures below taken at initial evaluation:  DIAGNOSTIC  FINDINGS: n/a for episode  COGNITION: Overall cognitive status: History of cognitive impairments - at baseline   SENSATION: Neuropathy in bilat feet  COORDINATION: Difficulty with rapid alternating movements    MUSCLE TONE: NT   POSTURE: rounded shoulders, forward head, flexed trunk , and head down , kyphosis  LOWER EXTREMITY ROM:     Active  Right Eval Left Eval  Hip flexion    Hip extension    Hip abduction    Hip adduction    Hip internal rotation  Hip external rotation    Knee flexion 120 120  Knee extension 0 0  Ankle dorsiflexion 5 12  Ankle plantarflexion    Ankle inversion    Ankle eversion     (Blank rows = not tested)  LOWER EXTREMITY MMT:    BLE strength grossly 3+/5  BED MOBILITY:  Mod A  TRANSFERS: Assistive device utilized: Environmental consultant - 2 wheeled  Sit to stand: CGA and Min A Stand to sit: SBA Chair to chair: SBA and CGA Floor: Mod A and Max A  RAMP:  Level of Assistance: CGA Assistive device utilized: Environmental consultant - 2 wheeled Ramp Comments:   CURB:  Level of Assistance: CGA Assistive device utilized: Environmental consultant - 2 wheeled Curb Comments:   STAIRS: NT  GAIT: Gait pattern: step to pattern, decreased stride length, and poor foot clearance- Right Distance walked: 100 Assistive device utilized: Walker - 2 wheeled Level of assistance: SBA and CGA Comments: level surfaces  FUNCTIONAL TESTS:  5 times sit to stand: 45 sec w/ CGA Timed up and go (TUG): 1 min 27 sec  M-CTSIB  Condition 1: Firm Surface, EO 10 Sec, Severe and retro-LOB  Sway  Condition 2: Firm Surface, EC 3 Sec, Severe and retro-LOB  Sway  Condition 3: Foam Surface, EO  Sec,  Sway  Condition 4: Foam Surface, EC  Sec,  Sway       GOALS: Goals reviewed with patient? Yes  SHORT TERM GOALS: UPDATED Target date: 05/15/2023>05/23/2023    Patient will be independent in HEP to improve functional outcomes Baseline: has been reviewed with PT and wife previously 06/04/23 Goal status:  MET 06/04/23  2.  Teach-back strategies to reduce freezing of gait during turns Baseline: 16 steps to complete 180 deg turn; unable to recall 06/04/23 Goal status: NOT MET 06/04/23  3.  Demo improved safety and independence with sit to stand completing transfers with set-up assist Baseline: CGA-min A; unchanged 06/04/23 Goal status: NOT MET 06/04/23    LONG TERM GOALS: UPDATED Target date: 06/05/2023>06/20/2023> 07/17/2023      Demo reduced risk for falls per time 35 sec TUG test Baseline: 1 min 27 sec; 1 min 15 sec 06/04/23; 54 sec 06/19/23 Goal status: IN PROGRESS 06/04/23  2.  Demo improved BLE strength and balance per time of 25 sec 5xSTS test to increase independence with mobility Baseline: 45 sec; 1 min 33 sec with occasional min A and consistent verbal cues 06/04/23; 1 min 18 sec 06/19/23 Goal status: IN PROGRESS   3.  Demo improved safety with ambulation on level surfaces w/ supervision x 150 ft Baseline: SBA-CGA; occasional min A for balance and cueing to reduce freezing for 160 ft 06/04/23; 150 ft w/ supervision level surfaces x 2 min 45 sec Goal status: MET  4.  Improve gait speed to 1.5 ft/sec to improve efficiency of ambulation  Baseline: 0.9 ft/sec  Goal status: INITIAL  ASSESSMENT:  CLINICAL IMPRESSION: Based on pt's reports, he and wife report wanting to focus on turns.  Worked on turning techniques in parallel bars, including marching turns, weightshifting turns, and sidestep turns.  Pt has continued decreased foot clearance with all turns in parallel bars, but marching turns seem to be the best option.  Worked with short distance gait with turns and the technique/cues that work the best seems to be to turn the walker, stop moving the walker, then turn towards the walker.  He needs reinforcement cues with all techniques; wife is present for education and verbalizes to  try some of these techniques at home.    OBJECTIVE IMPAIRMENTS: Abnormal gait, decreased activity  tolerance, decreased balance, decreased coordination, decreased mobility, difficulty walking, decreased ROM, decreased strength, improper body mechanics, and postural dysfunction.   ACTIVITY LIMITATIONS: carrying, lifting, standing, stairs, transfers, bed mobility, and locomotion level  PARTICIPATION LIMITATIONS: meal prep, cleaning, laundry, interpersonal relationship, community activity, and exercise routine  PERSONAL FACTORS: Age, Time since onset of injury/illness/exacerbation, and 3+ comorbidities: PMH  are also affecting patient's functional outcome.   REHAB POTENTIAL: Good  CLINICAL DECISION MAKING: Evolving/moderate complexity  EVALUATION COMPLEXITY: Moderate  PLAN:  PT FREQUENCY: 2x/week  PT DURATION: 4 weeks  PLANNED INTERVENTIONS: Therapeutic exercises, Therapeutic activity, Neuromuscular re-education, Balance training, Gait training, Patient/Family education, Self Care, and Joint mobilization  PLAN FOR NEXT SESSION: Continue standing/gait activities/turns in parallel bars and with RW.  Initiate session with Nustep at varied resistance/speeds for high intensity warm-up. review HEP and sit<>stand.  Try again U-step rollator and discuss how to obtain, if we feel this is appropriate.    Continue to work on staggered foot position and rocking through hips to prevent posterior LOB.   Lonia Blood, PT 06/26/23 2:07 PM Phone: [phone] Fax: [phone]  [facility] at [facility] [address] [place], [address] [place], [place] [address] Phone # [phone] Fax # [phone]

## 2023-06-25 NOTE — Therapy (Signed)
OUTPATIENT OCCUPATIONAL THERAPY PARKINSON'S  Treatment Note & re-certification  Patient Name: [name] MRN: [id number] DOB:11/19/1935, 87 y.o., male Today's Date: 06/25/2023  PCP: Garlan Fillers, MD REFERRING PROVIDER: Huston Foley, MD     END OF SESSION:  OT End of Session - 06/25/23 1349     Visit Number 14    Number of Visits 20    Date for OT Re-Evaluation 07/18/23    Authorization Type Medicare A &B    OT Start Time 1400    OT Stop Time 1445    OT Time Calculation (min) 45 min    Activity Tolerance Patient tolerated treatment well    Behavior During Therapy WFL for tasks assessed/performed;Flat affect                         Past Medical History:  Diagnosis Date   Ankylosing spondylitis (HCC) dx'd ~ 1974   Arthritis    "back" (08/10/2015)   BENIGN PROSTATIC HYPERTROPHY, WITH OBSTRUCTION 01/15/2010   Bleeding duodenal ulcer    Bleeding esophageal ulcer    Bleeding stomach ulcer    GERD 02/22/2008   History of blood transfusion 1988   "lost ~ 1/2 of my blood volume from multiple bleeding ulcers"   History of hiatal hernia    HYPERGLYCEMIA 11/18/2007   HYPERLIPIDEMIA 02/22/2008   HYPERTENSION, UNSPECIFIED 11/10/2009   Kidney stones    Osteoarthritis    c-spine   PEPTIC ULCER DISEASE 11/17/2007   Situational depression    "son died in MVA 06-24-15"   TIA (transient ischemic attack)    "not that I know of in the past; they are trying to determine if I've had one today (08/10/2015)"   Past Surgical History:  Procedure Laterality Date   CATARACT EXTRACTION W/ INTRAOCULAR LENS  IMPLANT, BILATERAL Bilateral 2013   Patient Active Problem List   Diagnosis Date Noted   Ankylosing spondylitis of cervicothoracic region (HCC) 10/16/2016   TIA (transient ischemic attack) 08/10/2015   Carotid stenosis 08/04/2014   Hyperlipidemia 02/21/2012   BENIGN PROSTATIC HYPERTROPHY, WITH OBSTRUCTION 01/15/2010   Essential hypertension 11/10/2009   GERD  02/22/2008   NEPHROLITHIASIS, HX OF 11/17/2007    ONSET DATE: referral date 03/25/23  REFERRING DIAG: G20.A2 (ICD-10-CM) - Parkinson's disease without dyskinesia, with fluctuations R26.81 (ICD-10-CM) - Unsteadiness on feet R41.841 (ICD-10-CM) - Cognitive communication deficit R27.8 (ICD-10-CM) - Other lack of coordination R26.2 (ICD-10-CM) - Difficulty in walking, not elsewhere classified  THERAPY DIAG:  Other symptoms and signs involving the nervous system  Muscle weakness (generalized)  Other lack of coordination  Unsteadiness on feet  Rationale for Evaluation and Treatment: Rehabilitation  SUBJECTIVE:   SUBJECTIVE STATEMENT: Pt prefers to be called "Fred."  Pt reports "some days are good and some days are not." Pt reports falling over cat's litter box this morning. Pt reports "stinger" (?bruise) of R buttocks and denies additional injuries. Pt's spouse confirmed no serious injuries and that pt got up with assistance from spouse.  Pt's spouse reports not hearing anything more about bedside commode.  Pt accompanied by: self and significant other (spouse, [name])  PERTINENT HISTORY: history of Parkinson's and ankylosing spondylitis   PRECAUTIONS: Fall  WEIGHT BEARING RESTRICTIONS: No  PAIN:  Are you having pain? Pt reported 6 out of 10 pain at R buttocks d/t fall today    FALLS: Has patient fallen in last 6 months? Yes. Number of falls has had multiple falls over the last few months  with multiple in early July, and then 2 falls this month.  06/25/23 - Pt reports falling this morning over cat litter box.  LIVING ENVIRONMENT: Lives with: lives with their spouse Lives in: House/apartment Stairs: Yes: External: 2-3 steps; has a ramp at entrance with raised railings Internal: steps to basement and steps to loft - however pt does not need to go up/down those at this time. Has following equipment at home: Dan Humphreys - 2 wheeled, Environmental consultant - 4 wheeled, bed side commode, Shower chair,  Ramped entry, and transport chair  PLOF: Requires assistive device for independence and Needs assistance with ADLs  PATIENT GOALS: to be able to move without concern/serious concern  OBJECTIVE:   HAND DOMINANCE: Right  ADLs: Overall ADLs: Pt reports there are times that he can be "fairly independent" with dressing but there are times where spouse has to provide significant assistance with. Transfers/ambulation related to ADLs: Utilizing RW and will occasionally walk bouts through the house without RW.  If pt loses balance backwards he is unable to correct. Eating: "fumble fingered" UB Dressing: occasional assistance LB Dressing: socks and shoes are quite difficult Toileting: "I go to the bathroom by myself" but has difficulty getting up/down from toilet Bathing: washing at the sink, spouse will assist  Tub Shower transfers: pt feels uncomfortable in the shower with shower chair, but has not fallen Equipment: Shower seat with back and Walk in shower with 5.5" ledge with 30" door  IADLs: Light housekeeping: washes dishes when he is feeling strong, however frequent onset of weakness Meal Prep: Pt was primary cook prior to fall in 2023, but has returned to participation in aspects of meal prep Community mobility: recently renewed drivers license, but is not driving  MOBILITY STATUS: Needs Assist: CGA with mobility with RW and Hx of falls   POSTURE COMMENTS:  rounded shoulders and forward head, posterior pelvic tilt and R lean  FUNCTIONAL OUTCOME MEASURES: Fastening/unfastening 3 buttons: unable to button any buttons in 1 mins Physical performance test: PPT#2 (simulated eating) 22.79 sec & PPT#4 (donning/doffing jacket): TBD  COORDINATION: Box and Blocks:  Right 30 blocks, Left 32 blocks  06/09/23: R: 39 blocks, L: 38 blocks  UE ROM:  all movements are slowed, decreased shoulder flexion d/t ankylosing spondylitis   UE MMT:    grossly 4/5 overall  COGNITION: Overall cognitive  status:  Spouse reports thinking continues to be slower  OBSERVATIONS: Bradykinesia   TODAY'S TREATMENT:                                                                         DATE:  06/25/23 OT educated pt and caregiver on methods to decrease fall risk. OT noted pt carrying walker and R lean when walking into clinic and therefore pt benefited from v/c to place walker on ground when ambulating. Pt required v/c and tactile cues to feel chair behind legs before sitting.  OT and pt reviewed big movements with "bag exercises" HEP (per 05/14/23 pt instructions) - to increase amplitude of movements, to prepare for sit-to-stand practice. Pt demo'd understanding with significant v/c and modeling for correct motions and "big" movements. Pt benefited from v/c and tactile cues to fully extend L elbow and to demo larger amplitude  movements. Pt demo'd larger amplitude movements by end of activity. Pt's spouse requested additional copy of "bag exercises" and therefore OT provided additional copy from 05/14/23 pt instructions.  Sit-to-stand and stand-to-sit practice with gait belt, assist level ranging from min assist to max assist - to improve ind with functional tasks when moving from sit to stand and return - Pt often demo'd significant posterior lean. - OT noted pt demo'd significant posterior lean when attempting to sit down today, and required min assist to maintain upright posture then v/c to sit safely. Therefore, pt participated in targeted practice of sit-to-stand and stand-to-sit with v/c (handout provided). Pt greatly benefited from using chair with arm rests to decrease assist required for transfers. OT educated pt's spouse on v/c and strategies for transfers to decrease caregiver burden and on using chairs with armrests. Pt's spouse verbalized understanding.  06/23/23 Large amplitude: engaged in modified LSVT big exercises to carry over to decreased burden of care with transitional movements and  dressing. Pt demonstrating improved forward and downward reach when given targets to reach towards. OT providing verbal cues for upright posture when reaching up and out to side, even placing a wedge to L side to facilitate increased open extension. Pt demonstrating increased amplitude with each movement.  BLE stretch: engaged in piriformis stretch and knee to chest to facilitate increased mobility as needed for LB dressing.  Ot providing demonstration and cues for hip external and internal rotation in figure 4 with sustained hold in figure 4 position 15 sec x5.  Attempted internal rotation x2 bilaterally with decreased understanding, requiring tactile cues to facilitate stretch in this position.  Pt able to demonstrate descent knee to chest movement with sustained hold x5 seconds alternating sides x5.  OT educated on body positioning and back support to facilitate increased hip mobility. LB dressing: reiterated recommendation to sit for LB dressing as pt still with attempts to complete in standing at bathroom counter.   06/19/23 Dynamic reaching: engaged in reaching forward, across midline, and incorporating reaching lower and then up to simulate functional reach during grooming and LB dressing tasks.  Progressed from completing in sitting to completing in standing.  Pt did stand 10 mins with supervision during dynamic reaching. LB dressing: simulated LB dressing with gait belt.  Pt with improved "large amplitude" with dressing one leg but still with small movements requiring cues for big amplitude.  Educated on functional carryover of exercises to dressing.  Reiterated bag exercise with LB dressing with plan to continue to address.  PATIENT EDUCATION: Education details: see 06/25/23 today's treatment notes above Person educated: Patient and Spouse Education method: Explanation and Handouts Education comprehension: verbalized understanding and needs further education  HOME EXERCISE PROGRAM: 06/25/23  - Handouts: V/c for sit-to-stand (see 06/25/23 pt instructions), additional copy of 05/14/23 "Bag Exercises" (see 05/14/23 pt instructions)  LSVT big exercises  Access Code: [email or web address] URL: [email or web address] Date: 06/23/2023 Prepared by: [facility] - [facility]  Exercises - Seated Figure 4 Piriformis Stretch  - 1 x daily - 10 reps - 15 sec hold - Seated Piriformis Stretch  - 1 x daily - 10 reps - 15 sec hold - Seated Single Knee to Chest  - 1 x daily - 10 reps - 5 sec hold   GOALS: Goals reviewed with patient? Yes  SHORT TERM GOALS: Target date: 05/23/23  Pt will be independent in full body HEP with focus on large amplitude movements as needed to increase independence with ADLs.  Baseline: Goal status: Not met - 05/26/23  2.  Pt will perform dynamic standing task for 5 mins as needed for simple IADLs w/o LOB using DME and/or countertop support prn  Baseline:  Goal status: IN PROGRESS  3.  Pt will demonstrate improved ease with sit > stand to get up from toilet or other seating options in home by improving score on sit > stand to decreased fall risk.  Baseline: time TBD Goal status: IN PROGRESS  4.  Pt will demonstrate improved sitting balance and trunk control to reach towards floor to retreive items to allow progression towards LB dressing. Baseline:  Goal status: MET - 05/28/23   LONG TERM GOALS: Target date: 07/18/23  Pt will complete UB dressing at Supervision/setup level with use of AE and/or alternative strategies PRN.  Baseline: Min A Goal status: IN PROGRESS  2.  Pt will be able to don pants, socks, and shoes with Supervision/setup with use of AE and/or alternative strategies PRN.  Baseline:  Goal status: IN PROGRESS  3.  Pt will demonstrate increased ease with dressing as evidenced by decreasing PPT#4(don/ doff jacket) by 10 secs or more.  Baseline: 1:05 Goal status: IN PROGRESS  4.  Pt and spouse will be independent in  PD specific HEP to facilitate increased ROM and amplitude as needed for ADLs and quality of life.  Baseline:  Goal status: IN PROGRESS   ASSESSMENT:  CLINICAL IMPRESSION: Pt continues to benefit from v/c for safety when ambulating with walker and when transitioning from sit-to-stand and return. Pt tolerated tasks well today. Pt also benefited from v/c to improve amplitude during tasks though demo'd improved amplitude by end of task. Pt would continue to benefit from skilled OT services to address deficits, increase overall independence.    PERFORMANCE DEFICITS: in functional skills including ADLs, IADLs, coordination, dexterity, ROM, strength, pain, flexibility, Fine motor control, Gross motor control, balance, body mechanics, endurance, decreased knowledge of precautions, decreased knowledge of use of DME, and UE functional use and psychosocial skills including coping strategies, environmental adaptation, and routines and behaviors.   IMPAIRMENTS: are limiting patient from ADLs and IADLs.    PLAN:  OT FREQUENCY: 2x/week  OT DURATION: 4 weeks  PLANNED INTERVENTIONS: self care/ADL training, therapeutic exercise, therapeutic activity, neuromuscular re-education, balance training, functional mobility training, ultrasound, compression bandaging, moist heat, cryotherapy, patient/family education, cognitive remediation/compensation, psychosocial skills training, energy conservation, coping strategies training, and DME and/or AE instructions  RECOMMENDED OTHER SERVICES: NA  CONSULTED AND AGREED WITH PLAN OF CARE: Patient  PLAN FOR NEXT SESSION:  Continue to review bag exercises for carryover to dressing, problem solve clothing fasteners, sit > stand, LB dressing.   Wynetta Emery, OTR/L 06/25/2023, 4:40 PM   [facility] [address], [address] [place], [place] [address] Phone # [phone] Fax # [phone]

## 2023-06-26 ENCOUNTER — Ambulatory Visit: Payer: Self-pay | Admitting: Licensed Clinical Social Worker

## 2023-06-27 NOTE — Patient Instructions (Signed)
Visit Information  Thank you for taking time to visit with me today. Please don't hesitate to contact me if I can be of assistance to you.   Following are the goals we discussed today:   Goals Addressed             This Visit's Progress    COMPLETED: Obtain Supportive Resources-Caregiver Strain   On track    Activities and task to complete in order to accomplish goals.   Keep all upcoming appointments discussed today Continue with compliance of taking medication prescribed by Doctor Implement healthy coping skills discussed to assist with management of symptoms Review supportive resources provided          Please call the care guide team at [phone] if you need to cancel or reschedule your appointment.   If you are experiencing a Mental Health or Behavioral Health Crisis or need someone to talk to, please call the Suicide and Crisis Lifeline: 988 call 911   Patient verbalizes understanding of instructions and care plan provided today and agrees to view in MyChart. Active MyChart status and patient understanding of how to access instructions and care plan via MyChart confirmed with patient.     No further follow up required: PCP practice will resume care coordination services  Jenel Lucks, MSW, LCSW [facility] Care Management [facility]  [facility] [email or web address] Phone [phone] 5:38 AM

## 2023-06-27 NOTE — Patient Outreach (Signed)
  Care Coordination   Follow Up Visit Note   06/26/2023 Name: TRINI CHRISTIANSEN MRN: [id number] DOB: 18-Jul-1936  LAURO MANLOVE is a 87 y.o. year old male who sees Garlan Fillers, MD for primary care. I spoke with  [name] by phone today.  What matters to the patients health and wellness today?  DME and Supportive Resources    Goals Addressed             This Visit's Progress    COMPLETED: Obtain Supportive Resources-Caregiver Strain   On track    Activities and task to complete in order to accomplish goals.   Keep all upcoming appointments discussed today Continue with compliance of taking medication prescribed by Doctor Implement healthy coping skills discussed to assist with management of symptoms Review supportive resources provided         SDOH assessments and interventions completed:  No     Care Coordination Interventions:  Yes, provided  Interventions Today    Flowsheet Row Most Recent Value  Chronic Disease   Chronic disease during today's visit Hypertension (HTN), Other  [Hx of TIA]  General Interventions   General Interventions Discussed/Reviewed General Interventions Reviewed, [employer], Doctor Visits, Horticulturist, commercial (DME)  [Patient and spouse report that [employer] has not contacted them regarding bedside commode. They will attempt to contact [employer] to f/up,  however, reports ongoing barriers reaching [employer] in the past]  Doctor Visits Discussed/Reviewed Doctor Visits Reviewed  Communication with PCP/Specialists  [LCSW will fax office note to PCP to review. Was informed from leadership PCP office will provide their own care management to patients. THIS PATIENT WILLL NEED CONTINUED CASE MANAGEMENT FOLLOW UP.]  Exercise Interventions   Exercise Discussed/Reviewed Exercise Reviewed  [Patient continues to participate in PT and OT]  Mental Health Interventions   Mental Health Discussed/Reviewed Mental Health Reviewed, Coping Strategies   Nutrition Interventions   Nutrition Discussed/Reviewed Nutrition Reviewed  Pharmacy Interventions   Pharmacy Dicussed/Reviewed Pharmacy Topics Reviewed, Medication Adherence  Safety Interventions   Safety Discussed/Reviewed Safety Reviewed, Fall Risk  [Patient is sore after falling yesterday. Pt's spouse was able to get patient up. No serious injures,  however, pt has sustained scratch on back. Family will treat with OTC products, if needed]       Follow up plan: No further intervention required. Practice will resume case management internally.  Encounter Outcome:  Patient Visit Completed   Jenel Lucks, MSW, LCSW [facility] Care Management [facility]  [facility] [email or web address] Phone [phone] 5:37 AM

## 2023-06-30 ENCOUNTER — Telehealth: Payer: Self-pay | Admitting: Neurology

## 2023-06-30 ENCOUNTER — Ambulatory Visit: Payer: Medicare Other | Attending: Neurology | Admitting: Occupational Therapy

## 2023-06-30 ENCOUNTER — Encounter: Payer: Self-pay | Admitting: Physical Therapy

## 2023-06-30 ENCOUNTER — Ambulatory Visit: Payer: Medicare Other | Admitting: Physical Therapy

## 2023-06-30 DIAGNOSIS — R262 Difficulty in walking, not elsewhere classified: Secondary | ICD-10-CM | POA: Insufficient documentation

## 2023-06-30 DIAGNOSIS — R2681 Unsteadiness on feet: Secondary | ICD-10-CM

## 2023-06-30 DIAGNOSIS — G20A2 Parkinson's disease without dyskinesia, with fluctuations: Secondary | ICD-10-CM | POA: Insufficient documentation

## 2023-06-30 DIAGNOSIS — M6281 Muscle weakness (generalized): Secondary | ICD-10-CM

## 2023-06-30 DIAGNOSIS — G20C Parkinsonism, unspecified: Secondary | ICD-10-CM

## 2023-06-30 DIAGNOSIS — R278 Other lack of coordination: Secondary | ICD-10-CM | POA: Diagnosis not present

## 2023-06-30 DIAGNOSIS — R29818 Other symptoms and signs involving the nervous system: Secondary | ICD-10-CM | POA: Insufficient documentation

## 2023-06-30 DIAGNOSIS — R293 Abnormal posture: Secondary | ICD-10-CM | POA: Insufficient documentation

## 2023-06-30 DIAGNOSIS — R2689 Other abnormalities of gait and mobility: Secondary | ICD-10-CM

## 2023-06-30 DIAGNOSIS — R296 Repeated falls: Secondary | ICD-10-CM

## 2023-06-30 NOTE — Therapy (Signed)
OUTPATIENT OCCUPATIONAL THERAPY PARKINSON'S  Treatment Note & re-certification  Patient Name: [name] MRN: [id number] DOB:11/12/1935, 87 y.o., male Today's Date: 06/30/2023  PCP: Garlan Fillers, MD REFERRING PROVIDER: Huston Foley, MD     END OF SESSION:  OT End of Session - 06/30/23 1419     Visit Number 15    Number of Visits 20    Date for OT Re-Evaluation 07/18/23    Authorization Type Medicare A &B    OT Start Time 1416   PT in bathroom   OT Stop Time 1446    OT Time Calculation (min) 30 min    Activity Tolerance Patient tolerated treatment well    Behavior During Therapy [facility] for tasks assessed/performed;Flat affect                          Past Medical History:  Diagnosis Date   Ankylosing spondylitis (HCC) dx'd ~ 1974   Arthritis    "back" (08/10/2015)   BENIGN PROSTATIC HYPERTROPHY, WITH OBSTRUCTION 01/15/2010   Bleeding duodenal ulcer    Bleeding esophageal ulcer    Bleeding stomach ulcer    GERD 02/22/2008   History of blood transfusion 1988   "lost ~ 1/2 of my blood volume from multiple bleeding ulcers"   History of hiatal hernia    HYPERGLYCEMIA 11/18/2007   HYPERLIPIDEMIA 02/22/2008   HYPERTENSION, UNSPECIFIED 11/10/2009   Kidney stones    Osteoarthritis    c-spine   PEPTIC ULCER DISEASE 11/17/2007   Situational depression    "son died in MVA 06/29/2015"   TIA (transient ischemic attack)    "not that I know of in the past; they are trying to determine if I've had one today (08/10/2015)"   Past Surgical History:  Procedure Laterality Date   CATARACT EXTRACTION W/ INTRAOCULAR LENS  IMPLANT, BILATERAL Bilateral 2013   Patient Active Problem List   Diagnosis Date Noted   Ankylosing spondylitis of cervicothoracic region (HCC) 10/16/2016   TIA (transient ischemic attack) 08/10/2015   Carotid stenosis 08/04/2014   Hyperlipidemia 02/21/2012   BENIGN PROSTATIC HYPERTROPHY, WITH OBSTRUCTION 01/15/2010   Essential hypertension  11/10/2009   GERD 02/22/2008   NEPHROLITHIASIS, HX OF 11/17/2007    ONSET DATE: referral date 03/25/23  REFERRING DIAG: G20.A2 (ICD-10-CM) - Parkinson's disease without dyskinesia, with fluctuations R26.81 (ICD-10-CM) - Unsteadiness on feet R41.841 (ICD-10-CM) - Cognitive communication deficit R27.8 (ICD-10-CM) - Other lack of coordination R26.2 (ICD-10-CM) - Difficulty in walking, not elsewhere classified  THERAPY DIAG:  Other symptoms and signs involving the nervous system  Muscle weakness (generalized)  Unsteadiness on feet  Abnormal posture  Rationale for Evaluation and Treatment: Rehabilitation  SUBJECTIVE:   SUBJECTIVE STATEMENT: Pt reports 2 near falls since last visit.  Pt accompanied by: self and significant other (spouse, [name])  PERTINENT HISTORY: history of Parkinson's and ankylosing spondylitis   PRECAUTIONS: Fall  WEIGHT BEARING RESTRICTIONS: No  PAIN:  Are you having pain? Pt reports "not significant".    FALLS: Has patient fallen in last 6 months? Yes. Number of falls has had multiple falls over the last few months with multiple in early July, and then 2 falls this month.  06/25/23 - Pt reports falling this morning over cat litter box.  LIVING ENVIRONMENT: Lives with: lives with their spouse Lives in: House/apartment Stairs: Yes: External: 2-3 steps; has a ramp at entrance with raised railings Internal: steps to basement and steps to loft - however pt does not need  to go up/down those at this time. Has following equipment at home: Dan Humphreys - 2 wheeled, Environmental consultant - 4 wheeled, bed side commode, Shower chair, Ramped entry, and transport chair  PLOF: Requires assistive device for independence and Needs assistance with ADLs  PATIENT GOALS: to be able to move without concern/serious concern  OBJECTIVE:   HAND DOMINANCE: Right  ADLs: Overall ADLs: Pt reports there are times that he can be "fairly independent" with dressing but there are times where spouse has  to provide significant assistance with. Transfers/ambulation related to ADLs: Utilizing RW and will occasionally walk bouts through the house without RW.  If pt loses balance backwards he is unable to correct. Eating: "fumble fingered" UB Dressing: occasional assistance LB Dressing: socks and shoes are quite difficult Toileting: "I go to the bathroom by myself" but has difficulty getting up/down from toilet Bathing: washing at the sink, spouse will assist  Tub Shower transfers: pt feels uncomfortable in the shower with shower chair, but has not fallen Equipment: Shower seat with back and Walk in shower with 5.5" ledge with 30" door  IADLs: Light housekeeping: washes dishes when he is feeling strong, however frequent onset of weakness Meal Prep: Pt was primary cook prior to fall in 2023, but has returned to participation in aspects of meal prep Community mobility: recently renewed drivers license, but is not driving  MOBILITY STATUS: Needs Assist: CGA with mobility with RW and Hx of falls   POSTURE COMMENTS:  rounded shoulders and forward head, posterior pelvic tilt and R lean  FUNCTIONAL OUTCOME MEASURES: Fastening/unfastening 3 buttons: unable to button any buttons in 1 mins Physical performance test: PPT#2 (simulated eating) 22.79 sec & PPT#4 (donning/doffing jacket): TBD  COORDINATION: Box and Blocks:  Right 30 blocks, Left 32 blocks  06/09/23: R: 39 blocks, L: 38 blocks  UE ROM:  all movements are slowed, decreased shoulder flexion d/t ankylosing spondylitis   UE MMT:    grossly 4/5 overall  COGNITION: Overall cognitive status:  Spouse reports thinking continues to be slower  OBSERVATIONS: Bradykinesia   TODAY'S TREATMENT:                                                                         DATE:  06/30/23 Sit > stand: engaged in sit > stand from mat table incorporating placing rings on vertical dowel to facilitate increased anterior weight shift and upright standing.   Pt demonstrating good sequencing and amplitude when incorporating in functional reach task afterwards.  Pt continues to demonstrate decreased motor control when returning to sitting, frequently "flopping" backwards.  OT increased height of dowel with pt then requiring increased upright posture and reach, however with increased backwards LOB requiring Min assist to lower to sitting.  Pt then directed to remove rings from vertical dowel to facilitate increased reach and balance.  Pt with 1 LOB backwards requiring mod assist to correct and OT providing cues for foot placement to increase BOS. Self-care: OT reviewed with pt and spouse progress towards securing a BSC for the home, with referral placed by neurologist.  OT provided picture of what BSC should look like and advised for them to place it over toilet seat to decrease risk of tipping over when sitting on  unsupported BSC.  OT also advised on awareness of BSC legs when turning to sit on BSC to now allow RW or feet to get caught on BSC legs.     06/25/23 OT educated pt and caregiver on methods to decrease fall risk. OT noted pt carrying walker and R lean when walking into clinic and therefore pt benefited from v/c to place walker on ground when ambulating. Pt required v/c and tactile cues to feel chair behind legs before sitting.  OT and pt reviewed big movements with "bag exercises" HEP (per 05/14/23 pt instructions) - to increase amplitude of movements, to prepare for sit-to-stand practice. Pt demo'd understanding with significant v/c and modeling for correct motions and "big" movements. Pt benefited from v/c and tactile cues to fully extend L elbow and to demo larger amplitude movements. Pt demo'd larger amplitude movements by end of activity. Pt's spouse requested additional copy of "bag exercises" and therefore OT provided additional copy from 05/14/23 pt instructions.  Sit-to-stand and stand-to-sit practice with gait belt, assist level ranging from min  assist to max assist - to improve ind with functional tasks when moving from sit to stand and return - Pt often demo'd significant posterior lean. - OT noted pt demo'd significant posterior lean when attempting to sit down today, and required min assist to maintain upright posture then v/c to sit safely. Therefore, pt participated in targeted practice of sit-to-stand and stand-to-sit with v/c (handout provided). Pt greatly benefited from using chair with arm rests to decrease assist required for transfers. OT educated pt's spouse on v/c and strategies for transfers to decrease caregiver burden and on using chairs with armrests. Pt's spouse verbalized understanding.   06/23/23 Large amplitude: engaged in modified LSVT big exercises to carry over to decreased burden of care with transitional movements and dressing. Pt demonstrating improved forward and downward reach when given targets to reach towards. OT providing verbal cues for upright posture when reaching up and out to side, even placing a wedge to L side to facilitate increased open extension. Pt demonstrating increased amplitude with each movement.  BLE stretch: engaged in piriformis stretch and knee to chest to facilitate increased mobility as needed for LB dressing.  Ot providing demonstration and cues for hip external and internal rotation in figure 4 with sustained hold in figure 4 position 15 sec x5.  Attempted internal rotation x2 bilaterally with decreased understanding, requiring tactile cues to facilitate stretch in this position.  Pt able to demonstrate descent knee to chest movement with sustained hold x5 seconds alternating sides x5.  OT educated on body positioning and back support to facilitate increased hip mobility. LB dressing: reiterated recommendation to sit for LB dressing as pt still with attempts to complete in standing at bathroom counter.   PATIENT EDUCATION: Education details: see treatment note above Person educated:  Patient and Spouse Education method: Explanation and Handouts Education comprehension: verbalized understanding and needs further education  HOME EXERCISE PROGRAM: 06/25/23 - Handouts: V/c for sit-to-stand (see 06/25/23 pt instructions), additional copy of 05/14/23 "Bag Exercises" (see 05/14/23 pt instructions)  LSVT big exercises  Access Code: [email or web address] URL: [email or web address] Date: 06/23/2023 Prepared by: [facility] - Outpatient  Rehab - [facility]  Exercises - Seated Figure 4 Piriformis Stretch  - 1 x daily - 10 reps - 15 sec hold - Seated Piriformis Stretch  - 1 x daily - 10 reps - 15 sec hold - Seated Single Knee to Chest  - 1 x daily - 10 reps -  5 sec hold   GOALS: Goals reviewed with patient? Yes  SHORT TERM GOALS: Target date: 05/23/23  Pt will be independent in full body HEP with focus on large amplitude movements as needed to increase independence with ADLs.  Baseline: Goal status: Not met - 05/26/23  2.  Pt will perform dynamic standing task for 5 mins as needed for simple IADLs w/o LOB using DME and/or countertop support prn  Baseline:  Goal status: IN PROGRESS  3.  Pt will demonstrate improved ease with sit > stand to get up from toilet or other seating options in home by improving score on sit > stand to decreased fall risk.  Baseline: time TBD Goal status: IN PROGRESS  4.  Pt will demonstrate improved sitting balance and trunk control to reach towards floor to retreive items to allow progression towards LB dressing. Baseline:  Goal status: MET - 05/28/23   LONG TERM GOALS: Target date: 07/18/23  Pt will complete UB dressing at Supervision/setup level with use of AE and/or alternative strategies PRN.  Baseline: Min A Goal status: IN PROGRESS  2.  Pt will be able to don pants, socks, and shoes with Supervision/setup with use of AE and/or alternative strategies PRN.  Baseline:  Goal status: IN PROGRESS  3.  Pt will demonstrate increased  ease with dressing as evidenced by decreasing PPT#4(don/ doff jacket) by 10 secs or more.  Baseline: 1:05 Goal status: IN PROGRESS  4.  Pt and spouse will be independent in PD specific HEP to facilitate increased ROM and amplitude as needed for ADLs and quality of life.  Baseline:  Goal status: IN PROGRESS   ASSESSMENT:  CLINICAL IMPRESSION: Pt continues to benefit from v/c for safety when ambulating with walker and when transitioning from sit-to-stand and return.  Pt demonstrating improved sequencing with sit > stand, however continues to demonstrate decreased motor control with decent to sitting.  Pt will benefit from [facility] for improved ease with sit > stand during toileting needs.  Pt also benefited from v/c to improve amplitude during tasks though demonstrated improved amplitude by end of task.   PERFORMANCE DEFICITS: in functional skills including ADLs, IADLs, coordination, dexterity, ROM, strength, pain, flexibility, Fine motor control, Gross motor control, balance, body mechanics, endurance, decreased knowledge of precautions, decreased knowledge of use of DME, and UE functional use and psychosocial skills including coping strategies, environmental adaptation, and routines and behaviors.   IMPAIRMENTS: are limiting patient from ADLs and IADLs.    PLAN:  OT FREQUENCY: 2x/week  OT DURATION: 4 weeks  PLANNED INTERVENTIONS: self care/ADL training, therapeutic exercise, therapeutic activity, neuromuscular re-education, balance training, functional mobility training, ultrasound, compression bandaging, moist heat, cryotherapy, patient/family education, cognitive remediation/compensation, psychosocial skills training, energy conservation, coping strategies training, and DME and/or AE instructions  RECOMMENDED OTHER SERVICES: NA  CONSULTED AND AGREED WITH PLAN OF CARE: Patient  PLAN FOR NEXT SESSION:  Continue to review bag exercises for carryover to dressing, problem solve clothing  fasteners, sit > stand, LB dressing.   Rosalio Loud, OTR/L 06/30/2023, 2:19 PM   [facility] [address] [place], [address] [place], [place] [address] Phone # [phone] Fax # [phone]

## 2023-06-30 NOTE — Therapy (Signed)
OUTPATIENT PHYSICAL THERAPY NEURO TREATMENT NOTE  Patient Name: [name] MRN: [id number] DOB:03-21-1936, 87 y.o., male Today's Date: 07/01/2023   PCP: Garlan Fillers REFERRING PROVIDER: Huston Foley, MD     END OF SESSION:  PT End of Session - 07/01/23 0737     Visit Number 17    Number of Visits 22    Date for PT Re-Evaluation 07/17/23    Authorization Type Medicare/[employer]-NEEDS KX due to previous PT and speech this year    Progress Note Due on Visit 24    PT Start Time 1450    PT Stop Time 1530    PT Time Calculation (min) 40 min    Equipment Utilized During Treatment Gait belt    Activity Tolerance Patient tolerated treatment well    Behavior During Therapy WFL for tasks assessed/performed                          Past Medical History:  Diagnosis Date   Ankylosing spondylitis (HCC) dx'd ~ 1974   Arthritis    "back" (08/10/2015)   BENIGN PROSTATIC HYPERTROPHY, WITH OBSTRUCTION 01/15/2010   Bleeding duodenal ulcer    Bleeding esophageal ulcer    Bleeding stomach ulcer    GERD 02/22/2008   History of blood transfusion 1988   "lost ~ 1/2 of my blood volume from multiple bleeding ulcers"   History of hiatal hernia    HYPERGLYCEMIA 11/18/2007   HYPERLIPIDEMIA 02/22/2008   HYPERTENSION, UNSPECIFIED 11/10/2009   Kidney stones    Osteoarthritis    c-spine   PEPTIC ULCER DISEASE 11/17/2007   Situational depression    "son died in MVA 06-30-15"   TIA (transient ischemic attack)    "not that I know of in the past; they are trying to determine if I've had one today (08/10/2015)"   Past Surgical History:  Procedure Laterality Date   CATARACT EXTRACTION W/ INTRAOCULAR LENS  IMPLANT, BILATERAL Bilateral 2013   Patient Active Problem List   Diagnosis Date Noted   Ankylosing spondylitis of cervicothoracic region (HCC) 10/16/2016   TIA (transient ischemic attack) 08/10/2015   Carotid stenosis 08/04/2014   Hyperlipidemia 02/21/2012   BENIGN PROSTATIC  HYPERTROPHY, WITH OBSTRUCTION 01/15/2010   Essential hypertension 11/10/2009   GERD 02/22/2008   NEPHROLITHIASIS, HX OF 11/17/2007    ONSET DATE: "several years"  REFERRING DIAG: G20.A2 (ICD-10-CM) - Parkinson's disease with fluctuating manifestations, unspecified whether dyskinesia present R26.81 (ICD-10-CM) - Unsteadiness on feet R41.841 (ICD-10-CM) - Cognitive communication deficit R27.8 (ICD-10-CM) - Other lack of coordination R26.2 (ICD-10-CM) - Difficulty in walking, not elsewhere classified  THERAPY DIAG:  Unsteadiness on feet  Other abnormalities of gait and mobility  Muscle weakness (generalized)  Abnormal posture  Rationale for Evaluation and Treatment: Rehabilitation  SUBJECTIVE:  SUBJECTIVE STATEMENT: Tried to use the exercise bike at the gym over the weekend.  Wife asks how to keep the speed up.  Pt does report counting steps at the intersection of hallways in his house.  Pt accompanied by: significant other  PERTINENT HISTORY: PD, complex medical history of peptic ulcer disease with history of upper GI bleed, TIA, depression, degenerative disc disease in the neck, osteoarthritis, kidney stones, hyperlipidemia, hypertension, history of hiatal hernia, BPH, arthritis, and ankylosing spondylitis, and parkinsonism   PAIN:  Are you having pain? Report mild pain in the R groin with Nustep   PRECAUTIONS: Fall  RED FLAGS: None   WEIGHT BEARING RESTRICTIONS: No  FALLS: Has patient fallen in last 6 months? Yes. Number of falls unknown, multiple  LIVING ENVIRONMENT: Lives with: lives with their spouse Lives in: House/apartment Stairs: Ramp to enter, stairs to loft and basement, ground floor set-up Has following equipment at home: Dan Humphreys - 2 wheeled  PLOF: Needs assistance with ADLs,  Needs assistance with homemaking, and Needs assistance with gait, supervision for ambulation at household distances  PATIENT GOALS: improve balance, walking, reduce falls  OBJECTIVE:       TODAY'S TREATMENT: 06/30/2023 Activity Comments  NuStep, Level 3, 4 extremities x 7 minutes Worked to stay above 90 SPM; discussed shorter bouts (1-2 minutes) of keeping higher speeds to translate for use at gym  In parallel bars:  forward walking with turns Counting steps-pt able to make 5-6 steps  Forward/back walking Decreased foot clearance with backwards stepping  Back step and weightshift, 2 x 10 reps each leg BUE support and tactile/verbal cues for increased step length  Gait with RW, 40 ft x 4 reps Short distance gait, cues provided to take as few steps as possible  Short distance gait with turns, 4 reps Cues to stop RW then turn to face red theraband>move RW/stop>then turn to face band   Sit<>stand transfers from chair, with Airex cushion at seat x 5 reps No posterior lean, supervision       PATIENT EDUCATION: Education details: Reinforced turning strategy and continued to reinforce cues for counting steps for less steps/large steps in small spaces at home.  Discussed ways to translate higher speed/increased intensity (short bouts with rest breaks) at recumbent stepper/bike at gym Person educated: Patient and Spouse Education method: Explanation, Demonstration, and Verbal cues Education comprehension: verbalized understanding and needs further education  Access Code: [email or web address] URL: [email or web address] Date: 05/08/2023 Prepared by: [facility] - Outpatient  Rehab - [facility]  Program Notes perform with wife's supervision  Exercises - Mini Squat with Counter Support  - 1 x daily - 5 x weekly - 2 sets - 10 reps - Staggered Stance Forward Backward Weight Shift with Counter Support  - 1 x daily - 5 x weekly - 2 sets - 10 reps - Standing Quarter Turn with Counter  Support  - 1 x daily - 5 x weekly - 2 sets - 5 reps - Seated Long Arc Quad with Ankle Weight  - 1 x daily - 5 x weekly - 3 sets - 10 reps - Seated Hip Flexion March with Ankle Weights  - 1 x daily - 5 x weekly - 3 sets - 10 reps - Seated Active Hip Flexion  - 1 x daily - 7 x weekly - 1 sets - 10 reps - 5-10 sec hold        ------------------------------------------------- Objective measures below taken at initial evaluation:  DIAGNOSTIC FINDINGS: n/a for episode  COGNITION: Overall cognitive status: History of cognitive impairments - at baseline   SENSATION: Neuropathy in bilat feet  COORDINATION: Difficulty with rapid alternating movements    MUSCLE TONE: NT   POSTURE: rounded shoulders, forward head, flexed trunk , and head down , kyphosis  LOWER EXTREMITY ROM:     Active  Right Eval Left Eval  Hip flexion    Hip extension    Hip abduction    Hip adduction    Hip internal rotation    Hip external rotation    Knee flexion 120 120  Knee extension 0 0  Ankle dorsiflexion 5 12  Ankle plantarflexion    Ankle inversion    Ankle eversion     (Blank rows = not tested)  LOWER EXTREMITY MMT:    BLE strength grossly 3+/5  BED MOBILITY:  Mod A  TRANSFERS: Assistive device utilized: Environmental consultant - 2 wheeled  Sit to stand: CGA and Min A Stand to sit: SBA Chair to chair: SBA and CGA Floor: Mod A and Max A  RAMP:  Level of Assistance: CGA Assistive device utilized: Environmental consultant - 2 wheeled Ramp Comments:   CURB:  Level of Assistance: CGA Assistive device utilized: Environmental consultant - 2 wheeled Curb Comments:   STAIRS: NT  GAIT: Gait pattern: step to pattern, decreased stride length, and poor foot clearance- Right Distance walked: 100 Assistive device utilized: Walker - 2 wheeled Level of assistance: SBA and CGA Comments: level surfaces  FUNCTIONAL TESTS:  5 times sit to stand: 45 sec w/ CGA Timed up and go (TUG): 1 min 27 sec  M-CTSIB  Condition 1: Firm Surface,  EO 10 Sec, Severe and retro-LOB  Sway  Condition 2: Firm Surface, EC 3 Sec, Severe and retro-LOB  Sway  Condition 3: Foam Surface, EO  Sec,  Sway  Condition 4: Foam Surface, EC  Sec,  Sway       GOALS: Goals reviewed with patient? Yes  SHORT TERM GOALS: UPDATED Target date: 05/15/2023>05/23/2023    Patient will be independent in HEP to improve functional outcomes Baseline: has been reviewed with PT and wife previously 06/04/23 Goal status: MET 06/04/23  2.  Teach-back strategies to reduce freezing of gait during turns Baseline: 16 steps to complete 180 deg turn; unable to recall 06/04/23 Goal status: NOT MET 06/04/23  3.  Demo improved safety and independence with sit to stand completing transfers with set-up assist Baseline: CGA-min A; unchanged 06/04/23 Goal status: NOT MET 06/04/23    LONG TERM GOALS: UPDATED Target date: 06/05/2023>06/20/2023> 07/17/2023      Demo reduced risk for falls per time 35 sec TUG test Baseline: 1 min 27 sec; 1 min 15 sec 06/04/23; 54 sec 06/19/23 Goal status: IN PROGRESS 06/04/23  2.  Demo improved BLE strength and balance per time of 25 sec 5xSTS test to increase independence with mobility Baseline: 45 sec; 1 min 33 sec with occasional min A and consistent verbal cues 06/04/23; 1 min 18 sec 06/19/23 Goal status: IN PROGRESS   3.  Demo improved safety with ambulation on level surfaces w/ supervision x 150 ft Baseline: SBA-CGA; occasional min A for balance and cueing to reduce freezing for 160 ft 06/04/23; 150 ft w/ supervision level surfaces x 2 min 45 sec Goal status: MET  4.  Improve gait speed to 1.5 ft/sec to improve efficiency of ambulation  Baseline: 0.9 ft/sec  Goal status: INITIAL  ASSESSMENT:  CLINICAL IMPRESSION: Pt presents to OPPT today reporting that he has been counting  steps in home at a hallway intersection and it is helping.  Worked again today on larger step length for short distance gait as well as turns.  He needs reminder cues  to turn walker, stop, move feet to face the walker, then repeat (versus picking up and continuing to move walker while he is moving).  He and wife went to gym over the weekend and PT answered questions, provided education on how to work on short, intensive bouts of aerobic activity on seated stepper/bike.  He does well in PT session today with decreased overall episodes of posterior lean; however, wife reports earlier today was a harder time.  He will continue to benefit from skilled PT towards goals for improved functional mobility and decreased falls.  OBJECTIVE IMPAIRMENTS: Abnormal gait, decreased activity tolerance, decreased balance, decreased coordination, decreased mobility, difficulty walking, decreased ROM, decreased strength, improper body mechanics, and postural dysfunction.   ACTIVITY LIMITATIONS: carrying, lifting, standing, stairs, transfers, bed mobility, and locomotion level  PARTICIPATION LIMITATIONS: meal prep, cleaning, laundry, interpersonal relationship, community activity, and exercise routine  PERSONAL FACTORS: Age, Time since onset of injury/illness/exacerbation, and 3+ comorbidities: PMH  are also affecting patient's functional outcome.   REHAB POTENTIAL: Good  CLINICAL DECISION MAKING: Evolving/moderate complexity  EVALUATION COMPLEXITY: Moderate  PLAN:  PT FREQUENCY: 2x/week  PT DURATION: 4 weeks  PLANNED INTERVENTIONS: Therapeutic exercises, Therapeutic activity, Neuromuscular re-education, Balance training, Gait training, Patient/Family education, Self Care, and Joint mobilization  PLAN FOR NEXT SESSION: Continue standing/gait activities/turns in parallel bars and with RW.  Initiate session with Nustep at varied resistance/speeds for high intensity warm-up. review HEP and sit<>stand.  Continue to work on staggered foot position and rocking through hips to prevent posterior LOB.   Lonia Blood, PT 07/01/23 7:38 AM Phone: [phone] Fax:  [phone]  [facility] at [facility] [address] [place], [address] [place], [place] [address] Phone # [phone] Fax # [phone]

## 2023-06-30 NOTE — Telephone Encounter (Signed)
Order placed for bedside commode, as recommended by OT.

## 2023-06-30 NOTE — Telephone Encounter (Signed)
Sent order to [facility] this am

## 2023-07-02 ENCOUNTER — Ambulatory Visit: Payer: Medicare Other | Admitting: Occupational Therapy

## 2023-07-02 ENCOUNTER — Ambulatory Visit: Payer: Medicare Other | Admitting: Physical Therapy

## 2023-07-07 ENCOUNTER — Ambulatory Visit: Payer: Medicare Other | Admitting: Physical Therapy

## 2023-07-07 ENCOUNTER — Ambulatory Visit: Payer: Medicare Other | Admitting: Occupational Therapy

## 2023-07-07 DIAGNOSIS — R2689 Other abnormalities of gait and mobility: Secondary | ICD-10-CM

## 2023-07-07 DIAGNOSIS — R2681 Unsteadiness on feet: Secondary | ICD-10-CM | POA: Diagnosis not present

## 2023-07-07 DIAGNOSIS — M6281 Muscle weakness (generalized): Secondary | ICD-10-CM

## 2023-07-07 DIAGNOSIS — R293 Abnormal posture: Secondary | ICD-10-CM

## 2023-07-07 DIAGNOSIS — R29818 Other symptoms and signs involving the nervous system: Secondary | ICD-10-CM

## 2023-07-07 DIAGNOSIS — G20A2 Parkinson's disease without dyskinesia, with fluctuations: Secondary | ICD-10-CM | POA: Diagnosis not present

## 2023-07-07 DIAGNOSIS — R278 Other lack of coordination: Secondary | ICD-10-CM

## 2023-07-07 NOTE — Therapy (Signed)
OUTPATIENT OCCUPATIONAL THERAPY PARKINSON'S  Treatment Note   Patient Name: [name] MRN: [id number] DOB:1936/01/08, 87 y.o., male Today's Date: 07/07/2023  PCP: Garlan Fillers, MD REFERRING PROVIDER: Huston Foley, MD     END OF SESSION:  OT End of Session - 07/07/23 1630     Visit Number 16    Number of Visits 20    Date for OT Re-Evaluation 07/18/23    Authorization Type Medicare A &B    OT Start Time 1450    OT Stop Time 1535    OT Time Calculation (min) 45 min    Activity Tolerance Patient tolerated treatment well    Behavior During Therapy WFL for tasks assessed/performed;Flat affect                           Past Medical History:  Diagnosis Date   Ankylosing spondylitis (HCC) dx'd ~ 1974   Arthritis    "back" (08/10/2015)   BENIGN PROSTATIC HYPERTROPHY, WITH OBSTRUCTION 01/15/2010   Bleeding duodenal ulcer    Bleeding esophageal ulcer    Bleeding stomach ulcer    GERD 02/22/2008   History of blood transfusion 1988   "lost ~ 1/2 of my blood volume from multiple bleeding ulcers"   History of hiatal hernia    HYPERGLYCEMIA 11/18/2007   HYPERLIPIDEMIA 02/22/2008   HYPERTENSION, UNSPECIFIED 11/10/2009   Kidney stones    Osteoarthritis    c-spine   PEPTIC ULCER DISEASE 11/17/2007   Situational depression    "son died in MVA 06-24-2015"   TIA (transient ischemic attack)    "not that I know of in the past; they are trying to determine if I've had one today (08/10/2015)"   Past Surgical History:  Procedure Laterality Date   CATARACT EXTRACTION W/ INTRAOCULAR LENS  IMPLANT, BILATERAL Bilateral 2013   Patient Active Problem List   Diagnosis Date Noted   Ankylosing spondylitis of cervicothoracic region (HCC) 10/16/2016   TIA (transient ischemic attack) 08/10/2015   Carotid stenosis 08/04/2014   Hyperlipidemia 02/21/2012   BENIGN PROSTATIC HYPERTROPHY, WITH OBSTRUCTION 01/15/2010   Essential hypertension 11/10/2009   GERD 02/22/2008    NEPHROLITHIASIS, HX OF 11/17/2007    ONSET DATE: referral date 03/25/23  REFERRING DIAG: G20.A2 (ICD-10-CM) - Parkinson's disease without dyskinesia, with fluctuations R26.81 (ICD-10-CM) - Unsteadiness on feet R41.841 (ICD-10-CM) - Cognitive communication deficit R27.8 (ICD-10-CM) - Other lack of coordination R26.2 (ICD-10-CM) - Difficulty in walking, not elsewhere classified  THERAPY DIAG:  Other symptoms and signs involving the nervous system  Other lack of coordination  Unsteadiness on feet  Muscle weakness (generalized)  Abnormal posture  Rationale for Evaluation and Treatment: Rehabilitation  SUBJECTIVE:   SUBJECTIVE STATEMENT: Pt's spouse reports that they received the [facility] on [place] and it is already in use and has been a big help.    Pt accompanied by: self and significant other (spouse, [name])  PERTINENT HISTORY: history of Parkinson's and ankylosing spondylitis   PRECAUTIONS: Fall  WEIGHT BEARING RESTRICTIONS: No  PAIN:  Are you having pain? Pt reports "not significant".    FALLS: Has patient fallen in last 6 months? Yes. Number of falls has had multiple falls over the last few months with multiple in early July, and then 2 falls this month.  07/07/23 - pt's spouse reports that he has not had a fall since Weds  LIVING ENVIRONMENT: Lives with: lives with their spouse Lives in: House/apartment Stairs: Yes: External: 2-3 steps; has a  ramp at entrance with raised railings Internal: steps to basement and steps to loft - however pt does not need to go up/down those at this time. Has following equipment at home: Dan Humphreys - 2 wheeled, Environmental consultant - 4 wheeled, bed side commode, Shower chair, Ramped entry, and transport chair  PLOF: Requires assistive device for independence and Needs assistance with ADLs  PATIENT GOALS: to be able to move without concern/serious concern  OBJECTIVE:   HAND DOMINANCE: Right  ADLs: Overall ADLs: Pt reports there are times that he can be  "fairly independent" with dressing but there are times where spouse has to provide significant assistance with. Transfers/ambulation related to ADLs: Utilizing RW and will occasionally walk bouts through the house without RW.  If pt loses balance backwards he is unable to correct. Eating: "fumble fingered" UB Dressing: occasional assistance LB Dressing: socks and shoes are quite difficult Toileting: "I go to the bathroom by myself" but has difficulty getting up/down from toilet Bathing: washing at the sink, spouse will assist  Tub Shower transfers: pt feels uncomfortable in the shower with shower chair, but has not fallen Equipment: Shower seat with back and Walk in shower with 5.5" ledge with 30" door  IADLs: Light housekeeping: washes dishes when he is feeling strong, however frequent onset of weakness Meal Prep: Pt was primary cook prior to fall in 2023, but has returned to participation in aspects of meal prep Community mobility: recently renewed drivers license, but is not driving  MOBILITY STATUS: Needs Assist: CGA with mobility with RW and Hx of falls   POSTURE COMMENTS:  rounded shoulders and forward head, posterior pelvic tilt and R lean  FUNCTIONAL OUTCOME MEASURES: Fastening/unfastening 3 buttons: unable to button any buttons in 1 mins Physical performance test: PPT#2 (simulated eating) 22.79 sec & PPT#4 (donning/doffing jacket): TBD  COORDINATION: Box and Blocks:  Right 30 blocks, Left 32 blocks  06/09/23: R: 39 blocks, L: 38 blocks  UE ROM:  all movements are slowed, decreased shoulder flexion d/t ankylosing spondylitis   UE MMT:    grossly 4/5 overall  COGNITION: Overall cognitive status:  Spouse reports thinking continues to be slower  OBSERVATIONS: Bradykinesia   TODAY'S TREATMENT:                                                                         DATE:  07/07/23 Large amplitude: engaged in forward and overhead reach with 2# medicine ball x10 with target  to facilitate increased amplitude. Engaged in reaching around back to pass ball from R<> L hand in both directions to increase ROM as needed when tucking in shirt, pulling up pants, donning belt, etc.  Then attempting to pass ball around at shoulders, however despite use of mirror pt with inability to sequence cognitively as well as decreased shoulder ROM due to ankylosing spondylitis.  Transitioned to tossing towel over shoulder and reaching down below with opposite hand to retreive at waist line.  Pt with improved ability on R> L.  AE: discussed magnetic button shirts vs pull over vs donning button up in pull over manner, discussed button hooks; discussed sock aid.  Pt has attempted button hook during previous bout with OT with no real benefit for him at the  time.  Pt has had sock aid demonstrated to him, however again too difficult to even don sock to sock aid due to tight fit of pt's socks. Sit>stand: engaged in sit>stand with use of mirror for visual feedback.  Pt completing good sit > stand 2/4 attempts and good control with stand > sit 3/4 attempts.  Pt still demonstrating freezing and cues for technique.   06/30/23 Sit > stand: engaged in sit > stand from mat table incorporating placing rings on vertical dowel to facilitate increased anterior weight shift and upright standing.  Pt demonstrating good sequencing and amplitude when incorporating in functional reach task afterwards.  Pt continues to demonstrate decreased motor control when returning to sitting, frequently "flopping" backwards.  OT increased height of dowel with pt then requiring increased upright posture and reach, however with increased backwards LOB requiring Min assist to lower to sitting.  Pt then directed to remove rings from vertical dowel to facilitate increased reach and balance.  Pt with 1 LOB backwards requiring mod assist to correct and OT providing cues for foot placement to increase BOS. Self-care: OT reviewed with pt and  spouse progress towards securing a BSC for the home, with referral placed by neurologist.  OT provided picture of what BSC should look like and advised for them to place it over toilet seat to decrease risk of tipping over when sitting on unsupported BSC.  OT also advised on awareness of BSC legs when turning to sit on BSC to now allow RW or feet to get caught on BSC legs.     06/25/23 OT educated pt and caregiver on methods to decrease fall risk. OT noted pt carrying walker and R lean when walking into clinic and therefore pt benefited from v/c to place walker on ground when ambulating. Pt required v/c and tactile cues to feel chair behind legs before sitting.  OT and pt reviewed big movements with "bag exercises" HEP (per 05/14/23 pt instructions) - to increase amplitude of movements, to prepare for sit-to-stand practice. Pt demo'd understanding with significant v/c and modeling for correct motions and "big" movements. Pt benefited from v/c and tactile cues to fully extend L elbow and to demo larger amplitude movements. Pt demo'd larger amplitude movements by end of activity. Pt's spouse requested additional copy of "bag exercises" and therefore OT provided additional copy from 05/14/23 pt instructions.  Sit-to-stand and stand-to-sit practice with gait belt, assist level ranging from min assist to max assist - to improve ind with functional tasks when moving from sit to stand and return - Pt often demo'd significant posterior lean. - OT noted pt demo'd significant posterior lean when attempting to sit down today, and required min assist to maintain upright posture then v/c to sit safely. Therefore, pt participated in targeted practice of sit-to-stand and stand-to-sit with v/c (handout provided). Pt greatly benefited from using chair with arm rests to decrease assist required for transfers. OT educated pt's spouse on v/c and strategies for transfers to decrease caregiver burden and on using chairs with  armrests. Pt's spouse verbalized understanding.  PATIENT EDUCATION: Education details: see treatment note above Person educated: Patient and Spouse Education method: Explanation and Handouts Education comprehension: verbalized understanding and needs further education  HOME EXERCISE PROGRAM: 06/25/23 - Handouts: V/c for sit-to-stand (see 06/25/23 pt instructions), additional copy of 05/14/23 "Bag Exercises" (see 05/14/23 pt instructions)  LSVT big exercises  Access Code: [email or web address] URL: [email or web address] Date: 06/23/2023 Prepared by: [facility] - Outpatient  Rehab - [facility]  Exercises - Seated Figure 4 Piriformis Stretch  - 1 x daily - 10 reps - 15 sec hold - Seated Piriformis Stretch  - 1 x daily - 10 reps - 15 sec hold - Seated Single Knee to Chest  - 1 x daily - 10 reps - 5 sec hold   GOALS: Goals reviewed with patient? Yes  SHORT TERM GOALS: Target date: 05/23/23  Pt will be independent in full body HEP with focus on large amplitude movements as needed to increase independence with ADLs.  Baseline: Goal status: Not met - 05/26/23  2.  Pt will perform dynamic standing task for 5 mins as needed for simple IADLs w/o LOB using DME and/or countertop support prn  Baseline:  Goal status: IN PROGRESS  3.  Pt will demonstrate improved ease with sit > stand to get up from toilet or other seating options in home by improving score on sit > stand to decreased fall risk.  Baseline: time TBD Goal status: IN PROGRESS  4.  Pt will demonstrate improved sitting balance and trunk control to reach towards floor to retreive items to allow progression towards LB dressing. Baseline:  Goal status: MET - 05/28/23   LONG TERM GOALS: Target date: 07/18/23  Pt will complete UB dressing at Supervision/setup level with use of AE and/or alternative strategies PRN.  Baseline: Min A Goal status: IN PROGRESS  2.  Pt will be able to don pants, socks, and shoes with  Supervision/setup with use of AE and/or alternative strategies PRN.  Baseline:  Goal status: IN PROGRESS  3.  Pt will demonstrate increased ease with dressing as evidenced by decreasing PPT#4(don/ doff jacket) by 10 secs or more.  Baseline: 1:05 Goal status: IN PROGRESS  4.  Pt and spouse will be independent in PD specific HEP to facilitate increased ROM and amplitude as needed for ADLs and quality of life.  Baseline:  Goal status: IN PROGRESS   ASSESSMENT:  CLINICAL IMPRESSION: Pt continues to benefit from v/c for safety when ambulating with walker and when transitioning from sit-to-stand and return.  Pt with increased freezing with sit > stand and ambulation this session, benefiting from tactile and verbal cues for amplitude. Pt demonstrating improved sequencing and motor control with decent into sitting.  Pt continues to be limited in dressing due to PD as well as ankylosing spondylitis.    PERFORMANCE DEFICITS: in functional skills including ADLs, IADLs, coordination, dexterity, ROM, strength, pain, flexibility, Fine motor control, Gross motor control, balance, body mechanics, endurance, decreased Casey of precautions, decreased Casey of use of DME, and UE functional use and psychosocial skills including coping strategies, environmental adaptation, and routines and behaviors.   IMPAIRMENTS: are limiting patient from ADLs and IADLs.    PLAN:  OT FREQUENCY: 2x/week  OT DURATION: 4 weeks  PLANNED INTERVENTIONS: self care/ADL training, therapeutic exercise, therapeutic activity, neuromuscular re-education, balance training, functional mobility training, ultrasound, compression bandaging, moist heat, cryotherapy, patient/family education, cognitive remediation/compensation, psychosocial skills training, energy conservation, coping strategies training, and DME and/or AE instructions  RECOMMENDED OTHER SERVICES: NA  CONSULTED AND AGREED WITH PLAN OF CARE: Patient  PLAN FOR  NEXT SESSION:  Continue to review bag exercises for carryover to dressing, sit > stand, UB and LB dressing.   Rosalio Loud, OTR/L 07/07/2023, 4:30 PM   [facility] at [facility] [address] [place], [address] [place], [place] [address] Phone # [phone] Fax # [phone]

## 2023-07-07 NOTE — Therapy (Signed)
OUTPATIENT PHYSICAL THERAPY NEURO TREATMENT NOTE  Patient Name: [name]: [id number] DOB:[birth year], 87 y.o., male Today's Date: 07/07/2023   PCP: Garlan Fillers REFERRING PROVIDER: Huston Foley, MD     END OF SESSION:  PT End of Session - 07/07/23 1641     Visit Number 18    Number of Visits 22    Date for PT Re-Evaluation 07/17/23    Authorization Type Medicare/AARP-NEEDS KX due to previous PT and speech this year    Progress Note Due on Visit 24    PT Start Time 1401    PT Stop Time 1446    PT Time Calculation (min) 45 min    Equipment Utilized During Treatment Gait belt    Activity Tolerance Patient tolerated treatment well    Behavior During Therapy WFL for tasks assessed/performed                          Past Medical History:  Diagnosis Date   Ankylosing spondylitis (HCC) dx'd ~ 1974   Arthritis    "back" (08/10/2015)   BENIGN PROSTATIC HYPERTROPHY, WITH OBSTRUCTION 01/15/2010   Bleeding duodenal ulcer    Bleeding esophageal ulcer    Bleeding stomach ulcer    GERD 02/22/2008   History of blood transfusion 1988   "lost ~ 1/2 of my blood volume from multiple bleeding ulcers"   History of hiatal hernia    HYPERGLYCEMIA 11/18/2007   HYPERLIPIDEMIA 02/22/2008   HYPERTENSION, UNSPECIFIED 11/10/2009   Kidney stones    Osteoarthritis    c-spine   PEPTIC ULCER DISEASE 11/17/2007   Situational depression    "son died in MVA 07/04/2015"   TIA (transient ischemic attack)    "not that I know of in the past; they are trying to determine if I've had one today (08/10/2015)"   Past Surgical History:  Procedure Laterality Date   CATARACT EXTRACTION W/ INTRAOCULAR LENS  IMPLANT, BILATERAL Bilateral 2013   Patient Active Problem List   Diagnosis Date Noted   Ankylosing spondylitis of cervicothoracic region (HCC) 10/16/2016   TIA (transient ischemic attack) 08/10/2015   Carotid stenosis 08/04/2014   Hyperlipidemia 02/21/2012   BENIGN PROSTATIC  HYPERTROPHY, WITH OBSTRUCTION 01/15/2010   Essential hypertension 11/10/2009   GERD 02/22/2008   NEPHROLITHIASIS, HX OF 11/17/2007    ONSET DATE: "several years"  REFERRING DIAG: G20.A2 (ICD-10-CM) - Parkinson's disease with fluctuating manifestations, unspecified whether dyskinesia present R26.81 (ICD-10-CM) - Unsteadiness on feet R41.841 (ICD-10-CM) - Cognitive communication deficit R27.8 (ICD-10-CM) - Other lack of coordination R26.2 (ICD-10-CM) - Difficulty in walking, not elsewhere classified  THERAPY DIAG:  Unsteadiness on feet  Other abnormalities of gait and mobility  Muscle weakness (generalized)  Rationale for Evaluation and Treatment: Rehabilitation  SUBJECTIVE:  SUBJECTIVE STATEMENT: Per wife:  3 falls last week-backing up or turning.  Got bruised, but no significant injuries.  Was able to get up with wife's help.  Having to get up a lot at night for bathroom.  May put him on a bladder medication.  Pt accompanied by: significant other  PERTINENT HISTORY: PD, complex medical history of peptic ulcer disease with history of upper GI bleed, TIA, depression, degenerative disc disease in the neck, osteoarthritis, kidney stones, hyperlipidemia, hypertension, history of hiatal hernia, BPH, arthritis, and ankylosing spondylitis, and parkinsonism   PAIN:  Are you having pain? Report mild pain in the R groin with Nustep   PRECAUTIONS: Fall  RED FLAGS: None   WEIGHT BEARING RESTRICTIONS: No  FALLS: Has patient fallen in last 6 months? Yes. Number of falls unknown, multiple  LIVING ENVIRONMENT: Lives with: lives with their spouse Lives in: House/apartment Stairs: Ramp to enter, stairs to loft and basement, ground floor set-up Has following equipment at home: Dan Humphreys - 2 wheeled  PLOF:  Needs assistance with ADLs, Needs assistance with homemaking, and Needs assistance with gait, supervision for ambulation at household distances  PATIENT GOALS: improve balance, walking, reduce falls  OBJECTIVE:    TODAY'S TREATMENT: 07/07/2023 Activity Comments  NuStep, Level 5, 4 extremities x 8 minutes Cues to keep SPM >80, with 30 second bouts >100; PT attempts verbal, visual, tactile cues to try to assist with longer strides on NuStep  Seated leg strengthening: LAQ March/stomp 3 x 10, 3#  Standing marching in place (at RW)  2 x 10, 3#  Forward/backward walking 5-10 ft with RW -practiced multiple reps for skill acquisition of balance in backward direction -cues for wider BOS, with quick stop-cues for additional step with foot and to "bring belly button forward" Initially with 3# weights at ankles, then weights removed     -slow to respond with forward cue for hips, but improves with repetition  Sit<>stand x 4 reps Cues and extra time for foot position                PATIENT EDUCATION: Education details: Backwards direction balance practice:  BIGGER, WIDER, SLOWER steps with bringing hips forward with quick stop Person educated: Patient and Spouse Education method: Explanation, Demonstration, and Verbal cues Education comprehension: verbalized understanding and needs further education  Access Code: [email or web address] URL: [email or web address] Date: 05/08/2023 Prepared by: [facility] - Outpatient  Rehab - [facility]  Program Notes perform with wife's supervision  Exercises - Mini Squat with Counter Support  - 1 x daily - 5 x weekly - 2 sets - 10 reps - Staggered Stance Forward Backward Weight Shift with Counter Support  - 1 x daily - 5 x weekly - 2 sets - 10 reps - Standing Quarter Turn with Counter Support  - 1 x daily - 5 x weekly - 2 sets - 5 reps - Seated Long Arc Quad with Ankle Weight  - 1 x daily - 5 x weekly - 3 sets - 10 reps - Seated Hip  Flexion March with Ankle Weights  - 1 x daily - 5 x weekly - 3 sets - 10 reps - Seated Active Hip Flexion  - 1 x daily - 7 x weekly - 1 sets - 10 reps - 5-10 sec hold        ------------------------------------------------- Objective measures below taken at initial evaluation:  DIAGNOSTIC FINDINGS: n/a for episode  COGNITION: Overall cognitive status: History of cognitive impairments - at baseline   SENSATION:  Neuropathy in bilat feet  COORDINATION: Difficulty with rapid alternating movements    MUSCLE TONE: NT   POSTURE: rounded shoulders, forward head, flexed trunk , and head down , kyphosis  LOWER EXTREMITY ROM:     Active  Right Eval Left Eval  Hip flexion    Hip extension    Hip abduction    Hip adduction    Hip internal rotation    Hip external rotation    Knee flexion 120 120  Knee extension 0 0  Ankle dorsiflexion 5 12  Ankle plantarflexion    Ankle inversion    Ankle eversion     (Blank rows = not tested)  LOWER EXTREMITY MMT:    BLE strength grossly 3+/5  BED MOBILITY:  Mod A  TRANSFERS: Assistive device utilized: Environmental consultant - 2 wheeled  Sit to stand: CGA and Min A Stand to sit: SBA Chair to chair: SBA and CGA Floor: Mod A and Max A  RAMP:  Level of Assistance: CGA Assistive device utilized: Environmental consultant - 2 wheeled Ramp Comments:   CURB:  Level of Assistance: CGA Assistive device utilized: Environmental consultant - 2 wheeled Curb Comments:   STAIRS: NT  GAIT: Gait pattern: step to pattern, decreased stride length, and poor foot clearance- Right Distance walked: 100 Assistive device utilized: Walker - 2 wheeled Level of assistance: SBA and CGA Comments: level surfaces  FUNCTIONAL TESTS:  5 times sit to stand: 45 sec w/ CGA Timed up and go (TUG): 1 min 27 sec  M-CTSIB  Condition 1: Firm Surface, EO 10 Sec, Severe and retro-LOB  Sway  Condition 2: Firm Surface, EC 3 Sec, Severe and retro-LOB  Sway  Condition 3: Foam Surface, EO  Sec,  Sway   Condition 4: Foam Surface, EC  Sec,  Sway       GOALS: Goals reviewed with patient? Yes  SHORT TERM GOALS: UPDATED Target date: 05/15/2023>05/23/2023    Patient will be independent in HEP to improve functional outcomes Baseline: has been reviewed with PT and wife previously 06/04/23 Goal status: MET 06/04/23  2.  Teach-back strategies to reduce freezing of gait during turns Baseline: 16 steps to complete 180 deg turn; unable to recall 06/04/23 Goal status: NOT MET 06/04/23  3.  Demo improved safety and independence with sit to stand completing transfers with set-up assist Baseline: CGA-min A; unchanged 06/04/23 Goal status: NOT MET 06/04/23    LONG TERM GOALS: UPDATED Target date: 06/05/2023>06/20/2023> 07/17/2023      Demo reduced risk for falls per time 35 sec TUG test Baseline: 1 min 27 sec; 1 min 15 sec 06/04/23; 54 sec 06/19/23 Goal status: IN PROGRESS 06/04/23  2.  Demo improved BLE strength and balance per time of 25 sec 5xSTS test to increase independence with mobility Baseline: 45 sec; 1 min 33 sec with occasional min A and consistent verbal cues 06/04/23; 1 min 18 sec 06/19/23 Goal status: IN PROGRESS   3.  Demo improved safety with ambulation on level surfaces w/ supervision x 150 ft Baseline: SBA-CGA; occasional min A for balance and cueing to reduce freezing for 160 ft 06/04/23; 150 ft w/ supervision level surfaces x 2 min 45 sec Goal status: MET  4.  Improve gait speed to 1.5 ft/sec to improve efficiency of ambulation  Baseline: 0.9 ft/sec  Goal status: INITIAL  ASSESSMENT:  CLINICAL IMPRESSION: Pt presents today and wife reports cancelling a visit last week due to a fall.  She does report he had 3 falls in the  past week, all in posterior direction.  Worked in session today on aerobic and lower body warm-up for strengthening, then really worked on repetition of backward walking and transitions to stop to avoid falls.  Pt has strong posterior lean in backwards  direction, as his steps hasten, so really worked on SLOWED pace and BIGGER steps in posterior direction with cues to step to correct and bring belly button (hips) forward with quick stop.  Pt's bradykinesia likely plays a role in slowed response time to this activity, but he does improve with practice.  He will continue to benefit from specific practice on posterior balance recovery.     OBJECTIVE IMPAIRMENTS: Abnormal gait, decreased activity tolerance, decreased balance, decreased coordination, decreased mobility, difficulty walking, decreased ROM, decreased strength, improper body mechanics, and postural dysfunction.   ACTIVITY LIMITATIONS: carrying, lifting, standing, stairs, transfers, bed mobility, and locomotion level  PARTICIPATION LIMITATIONS: meal prep, cleaning, laundry, interpersonal relationship, community activity, and exercise routine  PERSONAL FACTORS: Age, Time since onset of injury/illness/exacerbation, and 3+ comorbidities: PMH  are also affecting patient's functional outcome.   REHAB POTENTIAL: Good  CLINICAL DECISION MAKING: Evolving/moderate complexity  EVALUATION COMPLEXITY: Moderate  PLAN:  PT FREQUENCY: 2x/week  PT DURATION: 4 weeks  PLANNED INTERVENTIONS: Therapeutic exercises, Therapeutic activity, Neuromuscular re-education, Balance training, Gait training, Patient/Family education, Self Care, and Joint mobilization  PLAN FOR NEXT SESSION: Backwards balance recovery.    Initiate session with Nustep at varied resistance/speeds for high intensity warm-up. review HEP and sit<>stand.  Continue to work on staggered foot position and rocking through hips to prevent posterior LOB.   Lonia Blood, PT 07/07/23 4:43 PM Phone: [phone] Fax: [phone]  [facility] at [facility] [address] [place], [address] [place], [place] [address] Phone # [phone] Fax # [phone]

## 2023-07-09 ENCOUNTER — Ambulatory Visit: Payer: Medicare Other | Admitting: Occupational Therapy

## 2023-07-09 ENCOUNTER — Ambulatory Visit: Payer: Medicare Other

## 2023-07-09 DIAGNOSIS — R2681 Unsteadiness on feet: Secondary | ICD-10-CM | POA: Diagnosis not present

## 2023-07-09 DIAGNOSIS — M6281 Muscle weakness (generalized): Secondary | ICD-10-CM | POA: Diagnosis not present

## 2023-07-09 DIAGNOSIS — G20A2 Parkinson's disease without dyskinesia, with fluctuations: Secondary | ICD-10-CM | POA: Diagnosis not present

## 2023-07-09 DIAGNOSIS — R2689 Other abnormalities of gait and mobility: Secondary | ICD-10-CM

## 2023-07-09 DIAGNOSIS — R293 Abnormal posture: Secondary | ICD-10-CM

## 2023-07-09 DIAGNOSIS — R262 Difficulty in walking, not elsewhere classified: Secondary | ICD-10-CM

## 2023-07-09 DIAGNOSIS — R29818 Other symptoms and signs involving the nervous system: Secondary | ICD-10-CM

## 2023-07-09 DIAGNOSIS — R278 Other lack of coordination: Secondary | ICD-10-CM

## 2023-07-09 NOTE — Therapy (Signed)
OUTPATIENT PHYSICAL THERAPY NEURO TREATMENT NOTE  Patient Name: [name] MRN: [id number] DOB:09-07-1935, 87 y.o., male Today's Date: 07/09/2023   PCP: Garlan Fillers REFERRING PROVIDER: Huston Foley, MD     END OF SESSION:  PT End of Session - 07/09/23 1449     Visit Number 19    Number of Visits 22    Date for PT Re-Evaluation 07/17/23    Authorization Type Medicare/AARP-NEEDS KX due to previous PT and speech this year    Progress Note Due on Visit 24    PT Start Time 1445    PT Stop Time 1530    PT Time Calculation (min) 45 min    Equipment Utilized During Treatment Gait belt    Activity Tolerance Patient tolerated treatment well    Behavior During Therapy WFL for tasks assessed/performed                          Past Medical History:  Diagnosis Date   Ankylosing spondylitis (HCC) dx'd ~ 1974   Arthritis    "back" (08/10/2015)   BENIGN PROSTATIC HYPERTROPHY, WITH OBSTRUCTION 01/15/2010   Bleeding duodenal ulcer    Bleeding esophageal ulcer    Bleeding stomach ulcer    GERD 02/22/2008   History of blood transfusion 1988   "lost ~ 1/2 of my blood volume from multiple bleeding ulcers"   History of hiatal hernia    HYPERGLYCEMIA 11/18/2007   HYPERLIPIDEMIA 02/22/2008   HYPERTENSION, UNSPECIFIED 11/10/2009   Kidney stones    Osteoarthritis    c-spine   PEPTIC ULCER DISEASE 11/17/2007   Situational depression    "son died in MVA 07-12-2015"   TIA (transient ischemic attack)    "not that I know of in the past; they are trying to determine if I've had one today (08/10/2015)"   Past Surgical History:  Procedure Laterality Date   CATARACT EXTRACTION W/ INTRAOCULAR LENS  IMPLANT, BILATERAL Bilateral 2013   Patient Active Problem List   Diagnosis Date Noted   Ankylosing spondylitis of cervicothoracic region (HCC) 10/16/2016   TIA (transient ischemic attack) 08/10/2015   Carotid stenosis 08/04/2014   Hyperlipidemia 02/21/2012   BENIGN PROSTATIC  HYPERTROPHY, WITH OBSTRUCTION 01/15/2010   Essential hypertension 11/10/2009   GERD 02/22/2008   NEPHROLITHIASIS, HX OF 11/17/2007    ONSET DATE: "several years"  REFERRING DIAG: G20.A2 (ICD-10-CM) - Parkinson's disease with fluctuating manifestations, unspecified whether dyskinesia present R26.81 (ICD-10-CM) - Unsteadiness on feet R41.841 (ICD-10-CM) - Cognitive communication deficit R27.8 (ICD-10-CM) - Other lack of coordination R26.2 (ICD-10-CM) - Difficulty in walking, not elsewhere classified  THERAPY DIAG:  Other symptoms and signs involving the nervous system  Unsteadiness on feet  Muscle weakness (generalized)  Abnormal posture  Other abnormalities of gait and mobility  Parkinson's disease with fluctuating manifestations, unspecified whether dyskinesia present (HCC)  Difficulty in walking, not elsewhere classified  Rationale for Evaluation and Treatment: Rehabilitation  SUBJECTIVE:  SUBJECTIVE STATEMENT: Reports additional falls this past week, primarily when getting up in the night to use restroom  Pt accompanied by: significant other  PERTINENT HISTORY: PD, complex medical history of peptic ulcer disease with history of upper GI bleed, TIA, depression, degenerative disc disease in the neck, osteoarthritis, kidney stones, hyperlipidemia, hypertension, history of hiatal hernia, BPH, arthritis, and ankylosing spondylitis, and parkinsonism   PAIN:  Are you having pain? Having some discomfort in right elbow from recent fall  PRECAUTIONS: Fall  RED FLAGS: None   WEIGHT BEARING RESTRICTIONS: No  FALLS: Has patient fallen in last 6 months? Yes. Number of falls unknown, multiple  LIVING ENVIRONMENT: Lives with: lives with their spouse Lives in: House/apartment Stairs: Ramp to  enter, stairs to loft and basement, ground floor set-up Has following equipment at home: Dan Humphreys - 2 wheeled  PLOF: Needs assistance with ADLs, Needs assistance with homemaking, and Needs assistance with gait, supervision for ambulation at household distances  PATIENT GOALS: improve balance, walking, reduce falls  OBJECTIVE:   TODAY'S TREATMENT: 07/09/23 Activity Comments  LAQ 3x10 4#  Gait training -w/ RW and cues for stride length and cues for negotiating turns to reduce freezing -use of agility ladder on floor and CGA and climbing stairs w/ BHR x 4 laps and 1x with dual-tasking   Transfer training -techniques to promote trunk flexion over BOS and push-off of UE  NU-step level 5 x 8 min Performing dual-tasking through for divided attention, but requiring cues to maintain SPM and/or stride length           TODAY'S TREATMENT: 07/07/2023 Activity Comments  NuStep, Level 5, 4 extremities x 8 minutes Cues to keep SPM >80, with 30 second bouts >100; PT attempts verbal, visual, tactile cues to try to assist with longer strides on NuStep  Seated leg strengthening: LAQ March/stomp 3 x 10, 3#  Standing marching in place (at RW)  2 x 10, 3#  Forward/backward walking 5-10 ft with RW -practiced multiple reps for skill acquisition of balance in backward direction -cues for wider BOS, with quick stop-cues for additional step with foot and to "bring belly button forward" Initially with 3# weights at ankles, then weights removed     -slow to respond with forward cue for hips, but improves with repetition  Sit<>stand x 4 reps Cues and extra time for foot position         PATIENT EDUCATION: Education details: Backwards direction balance practice:  BIGGER, WIDER, SLOWER steps with bringing hips forward with quick stop Person educated: Patient and Spouse Education method: Explanation, Demonstration, and Verbal cues Education comprehension: verbalized understanding and needs further education   Access Code: [email or web address] URL: [email or web address] Date: 05/08/2023 Prepared by: [facility] - Outpatient  Rehab - [facility]  Program Notes perform with wife's supervision  Exercises - Mini Squat with Counter Support  - 1 x daily - 5 x weekly - 2 sets - 10 reps - Staggered Stance Forward Backward Weight Shift with Counter Support  - 1 x daily - 5 x weekly - 2 sets - 10 reps - Standing Quarter Turn with Counter Support  - 1 x daily - 5 x weekly - 2 sets - 5 reps - Seated Long Arc Quad with Ankle Weight  - 1 x daily - 5 x weekly - 3 sets - 10 reps - Seated Hip Flexion March with Ankle Weights  - 1 x daily - 5 x weekly - 3 sets - 10 reps - Seated Active Hip  Flexion  - 1 x daily - 7 x weekly - 1 sets - 10 reps - 5-10 sec hold        ------------------------------------------------- Objective measures below taken at initial evaluation:  DIAGNOSTIC FINDINGS: n/a for episode  COGNITION: Overall cognitive status: History of cognitive impairments - at baseline   SENSATION: Neuropathy in bilat feet  COORDINATION: Difficulty with rapid alternating movements    MUSCLE TONE: NT   POSTURE: rounded shoulders, forward head, flexed trunk , and head down , kyphosis  LOWER EXTREMITY ROM:     Active  Right Eval Left Eval  Hip flexion    Hip extension    Hip abduction    Hip adduction    Hip internal rotation    Hip external rotation    Knee flexion 120 120  Knee extension 0 0  Ankle dorsiflexion 5 12  Ankle plantarflexion    Ankle inversion    Ankle eversion     (Blank rows = not tested)  LOWER EXTREMITY MMT:    BLE strength grossly 3+/5  BED MOBILITY:  Mod A  TRANSFERS: Assistive device utilized: Environmental consultant - 2 wheeled  Sit to stand: CGA and Min A Stand to sit: SBA Chair to chair: SBA and CGA Floor: Mod A and Max A  RAMP:  Level of Assistance: CGA Assistive device utilized: Environmental consultant - 2 wheeled Ramp Comments:   CURB:  Level of Assistance:  CGA Assistive device utilized: Environmental consultant - 2 wheeled Curb Comments:   STAIRS: NT  GAIT: Gait pattern: step to pattern, decreased stride length, and poor foot clearance- Right Distance walked: 100 Assistive device utilized: Walker - 2 wheeled Level of assistance: SBA and CGA Comments: level surfaces  FUNCTIONAL TESTS:  5 times sit to stand: 45 sec w/ CGA Timed up and go (TUG): 1 min 27 sec  M-CTSIB  Condition 1: Firm Surface, EO 10 Sec, Severe and retro-LOB  Sway  Condition 2: Firm Surface, EC 3 Sec, Severe and retro-LOB  Sway  Condition 3: Foam Surface, EO  Sec,  Sway  Condition 4: Foam Surface, EC  Sec,  Sway       GOALS: Goals reviewed with patient? Yes  SHORT TERM GOALS: UPDATED Target date: 05/15/2023>05/23/2023    Patient will be independent in HEP to improve functional outcomes Baseline: has been reviewed with PT and wife previously 06/04/23 Goal status: MET 06/04/23  2.  Teach-back strategies to reduce freezing of gait during turns Baseline: 16 steps to complete 180 deg turn; unable to recall 06/04/23 Goal status: NOT MET 06/04/23  3.  Demo improved safety and independence with sit to stand completing transfers with set-up assist Baseline: CGA-min A; unchanged 06/04/23 Goal status: NOT MET 06/04/23    LONG TERM GOALS: UPDATED Target date: 06/05/2023>06/20/2023> 07/17/2023      Demo reduced risk for falls per time 35 sec TUG test Baseline: 1 min 27 sec; 1 min 15 sec 06/04/23; 54 sec 06/19/23 Goal status: IN PROGRESS 06/04/23  2.  Demo improved BLE strength and balance per time of 25 sec 5xSTS test to increase independence with mobility Baseline: 45 sec; 1 min 33 sec with occasional min A and consistent verbal cues 06/04/23; 1 min 18 sec 06/19/23 Goal status: IN PROGRESS   3.  Demo improved safety with ambulation on level surfaces w/ supervision x 150 ft Baseline: SBA-CGA; occasional min A for balance and cueing to reduce freezing for 160 ft 06/04/23; 150 ft w/  supervision level surfaces x 2 min  45 sec Goal status: MET  4.  Improve gait speed to 1.5 ft/sec to improve efficiency of ambulation  Baseline: 0.9 ft/sec  Goal status: INITIAL  ASSESSMENT:  CLINICAL IMPRESSION: Training in transfers to improve independence with sit to stand with cues for trunk flexion over BOS and sequencing BUE push-off. Gait training with emphasis on improving stride length with use of visual reference (agility ladder) on ground to provide cues for step length and ascending/descending stair set in same sequence requiring CGA due to unsteadiness as no AD used for this drill.  Good carryover of stride length with visual reference.  Trials of ambulation with dual-tasking with deterioration of performance and increased unsteadiness/shorter step length. Dual-tasking also performed on NU-step requiring interjections for cues to sustain performance with better results vs walking.  Continued sessions to progress POC details to improve safety with mobility and reduce risk for falls.   OBJECTIVE IMPAIRMENTS: Abnormal gait, decreased activity tolerance, decreased balance, decreased coordination, decreased mobility, difficulty walking, decreased ROM, decreased strength, improper body mechanics, and postural dysfunction.   ACTIVITY LIMITATIONS: carrying, lifting, standing, stairs, transfers, bed mobility, and locomotion level  PARTICIPATION LIMITATIONS: meal prep, cleaning, laundry, interpersonal relationship, community activity, and exercise routine  PERSONAL FACTORS: Age, Time since onset of injury/illness/exacerbation, and 3+ comorbidities: PMH  are also affecting patient's functional outcome.   REHAB POTENTIAL: Good  CLINICAL DECISION MAKING: Evolving/moderate complexity  EVALUATION COMPLEXITY: Moderate  PLAN:  PT FREQUENCY: 2x/week  PT DURATION: 4 weeks  PLANNED INTERVENTIONS: Therapeutic exercises, Therapeutic activity, Neuromuscular re-education, Balance training, Gait  training, Patient/Family education, Self Care, and Joint mobilization  PLAN FOR NEXT SESSION: Backwards balance recovery.    Initiate session with Nustep at varied resistance/speeds for high intensity warm-up. review HEP and sit<>stand.  Continue to work on staggered foot position and rocking through hips to prevent posterior LOB.   3:43 PM, 07/09/23 M. Shary Decamp, PT, DPT Physical Therapist- [place] Office Number: [phone]

## 2023-07-09 NOTE — Therapy (Signed)
OUTPATIENT OCCUPATIONAL THERAPY PARKINSON'S  Treatment Note   Patient Name: [name] MRN: [id number] DOB:[birth year], 87 y.o., male Today's Date: 07/09/2023  PCP: Garlan Fillers, MD REFERRING PROVIDER: Huston Foley, MD     END OF SESSION:  OT End of Session - 07/09/23 1453     Visit Number 17    Number of Visits 20    Date for OT Re-Evaluation 07/18/23    Authorization Type Medicare A &B    OT Start Time 0201    OT Stop Time 0244    OT Time Calculation (min) 43 min    Equipment Utilized During Treatment gait belt    Activity Tolerance Patient tolerated treatment well    Behavior During Therapy WFL for tasks assessed/performed;Flat affect                            Past Medical History:  Diagnosis Date   Ankylosing spondylitis (HCC) dx'd ~ 1974   Arthritis    "back" (08/10/2015)   BENIGN PROSTATIC HYPERTROPHY, WITH OBSTRUCTION 01/15/2010   Bleeding duodenal ulcer    Bleeding esophageal ulcer    Bleeding stomach ulcer    GERD 02/22/2008   History of blood transfusion 1988   "lost ~ 1/2 of my blood volume from multiple bleeding ulcers"   History of hiatal hernia    HYPERGLYCEMIA 11/18/2007   HYPERLIPIDEMIA 02/22/2008   HYPERTENSION, UNSPECIFIED 11/10/2009   Kidney stones    Osteoarthritis    c-spine   PEPTIC ULCER DISEASE 11/17/2007   Situational depression    "son died in MVA 07-06-2015"   TIA (transient ischemic attack)    "not that I know of in the past; they are trying to determine if I've had one today (08/10/2015)"   Past Surgical History:  Procedure Laterality Date   CATARACT EXTRACTION W/ INTRAOCULAR LENS  IMPLANT, BILATERAL Bilateral 2013   Patient Active Problem List   Diagnosis Date Noted   Ankylosing spondylitis of cervicothoracic region (HCC) 10/16/2016   TIA (transient ischemic attack) 08/10/2015   Carotid stenosis 08/04/2014   Hyperlipidemia 02/21/2012   BENIGN PROSTATIC HYPERTROPHY, WITH OBSTRUCTION 01/15/2010   Essential  hypertension 11/10/2009   GERD 02/22/2008   NEPHROLITHIASIS, HX OF 11/17/2007    ONSET DATE: referral date 03/25/23  REFERRING DIAG: G20.A2 (ICD-10-CM) - Parkinson's disease without dyskinesia, with fluctuations R26.81 (ICD-10-CM) - Unsteadiness on feet R41.841 (ICD-10-CM) - Cognitive communication deficit R27.8 (ICD-10-CM) - Other lack of coordination R26.2 (ICD-10-CM) - Difficulty in walking, not elsewhere classified  THERAPY DIAG:  Other symptoms and signs involving the nervous system  Other lack of coordination  Unsteadiness on feet  Muscle weakness (generalized)  Abnormal posture  Rationale for Evaluation and Treatment: Rehabilitation  SUBJECTIVE:   SUBJECTIVE STATEMENT: Pt reported things are going "reasonably well."  Pt accompanied by: self (dropped off by family friend, Sherilyn Cooter)  PERTINENT HISTORY: history of Parkinson's and ankylosing spondylitis   PRECAUTIONS: Fall  WEIGHT BEARING RESTRICTIONS: No  PAIN:  Are you having pain? 5/10 - "stinging" pain - Pt reports pain at R elbow d/t fall yesterday when pt scraped R elbow on brick fireplace.  FALLS: Has patient fallen in last 6 months? Yes. Number of falls has had multiple falls over the last few months with multiple in early July, and then 2 falls this month.  07/07/23 - Pt's spouse reports that he has not had a fall since Weds. 07/09/23 - Pt reports fall between midnight and 3  AM last night when returning from using the bathroom.  LIVING ENVIRONMENT: Lives with: lives with their spouse Lives in: House/apartment Stairs: Yes: External: 2-3 steps; has a ramp at entrance with raised railings Internal: steps to basement and steps to loft - however pt does not need to go up/down those at this time. Has following equipment at home: Dan Humphreys - 2 wheeled, Environmental consultant - 4 wheeled, bed side commode, Shower chair, Ramped entry, and transport chair  PLOF: Requires assistive device for independence and Needs assistance with  ADLs  PATIENT GOALS: to be able to move without concern/serious concern  OBJECTIVE:   HAND DOMINANCE: Right  ADLs: Overall ADLs: Pt reports there are times that he can be "fairly independent" with dressing but there are times where spouse has to provide significant assistance with. Transfers/ambulation related to ADLs: Utilizing RW and will occasionally walk bouts through the house without RW.  If pt loses balance backwards he is unable to correct. Eating: "fumble fingered" UB Dressing: occasional assistance LB Dressing: socks and shoes are quite difficult Toileting: "I go to the bathroom by myself" but has difficulty getting up/down from toilet Bathing: washing at the sink, spouse will assist  Tub Shower transfers: pt feels uncomfortable in the shower with shower chair, but has not fallen Equipment: Shower seat with back and Walk in shower with 5.5" ledge with 30" door  IADLs: Light housekeeping: washes dishes when he is feeling strong, however frequent onset of weakness Meal Prep: Pt was primary cook prior to fall in 2023, but has returned to participation in aspects of meal prep Community mobility: recently renewed drivers license, but is not driving  MOBILITY STATUS: Needs Assist: CGA with mobility with RW and Hx of falls   POSTURE COMMENTS:  rounded shoulders and forward head, posterior pelvic tilt and R lean  FUNCTIONAL OUTCOME MEASURES: Fastening/unfastening 3 buttons: unable to button any buttons in 1 mins Physical performance test: PPT#2 (simulated eating) 22.79 sec & PPT#4 (donning/doffing jacket): TBD  COORDINATION: Box and Blocks:  Right 30 blocks, Left 32 blocks  06/09/23: R: 39 blocks, L: 38 blocks  UE ROM:  all movements are slowed, decreased shoulder flexion d/t ankylosing spondylitis   UE MMT:    grossly 4/5 overall  COGNITION: Overall cognitive status:  Spouse reports thinking continues to be slower  OBSERVATIONS: Bradykinesia   TODAY'S TREATMENT:                                                                          DATE:  07/09/23  Therapeutic activities: Pt ambulated into clinic with gait belt and walker. Pt required extra time, demo'd freezing gait particularly when making turns, and benefited from  v/c and tactile cue to keep walker on floor when ambulating. OT educated pt on decreasing fall risk by using bedside commode instead of walking to standard bathroom at night. Pt verbalized understanding though expressed concerns that there may be difficulties with rearranging furniture. Pt stated "let's just change one thing at a time." Sit-to-stand and placing resistive clips - to improve BUE strengthening and FM coordination, to improve safety with sit-to-stand transition. - With extra time, pt verbalized and demo'd steps of sit-to-stand with min v/c for recall and min assistance for  transition. Pt placed x8 yellow clips and x4 red clips with extra time and v/c for processing/recall of specified colors. Self-care -  LB dressing task of donning/doffing shoe/sock - to increase ind with LB dressing ADL tasks, to improve forward functional reach for ADL tasks, to address sequencing and processing for ADL tasks. - OT educated pt on use of reacher for LB dressing, use of step stool, and no-tie laces options (see pt instructions, handout provided). Pt demo'd difficulty with reacher therefore task was reverted to forward reach to remove shoes/socks. Pt demo'd increased success when using step stool to remove socks/shoes and therefore OT recommended continuing to use step stool at home for LB dressing. Pt verbalized understanding. Pt demo'd significant difficulty sequencing motor movements for tying shoes despite v/c, therapist modeling, and extra time. OT educated pt on no-tie laces to increase ind for LB dressing tasks. Pt verbalized understanding.  07/07/23 Large amplitude: engaged in forward and overhead reach with 2# medicine ball x10 with target to  facilitate increased amplitude. Engaged in reaching around back to pass ball from R<> L hand in both directions to increase ROM as needed when tucking in shirt, pulling up pants, donning belt, etc.  Then attempting to pass ball around at shoulders, however despite use of mirror pt with inability to sequence cognitively as well as decreased shoulder ROM due to ankylosing spondylitis.  Transitioned to tossing towel over shoulder and reaching down below with opposite hand to retreive at waist line.  Pt with improved ability on R> L.  AE: discussed magnetic button shirts vs pull over vs donning button up in pull over manner, discussed button hooks; discussed sock aid.  Pt has attempted button hook during previous bout with OT with no real benefit for him at the time.  Pt has had sock aid demonstrated to him, however again too difficult to even don sock to sock aid due to tight fit of pt's socks. Sit>stand: engaged in sit>stand with use of mirror for visual feedback.  Pt completing good sit > stand 2/4 attempts and good control with stand > sit 3/4 attempts.  Pt still demonstrating freezing and cues for technique.   06/30/23 Sit > stand: engaged in sit > stand from mat table incorporating placing rings on vertical dowel to facilitate increased anterior weight shift and upright standing.  Pt demonstrating good sequencing and amplitude when incorporating in functional reach task afterwards.  Pt continues to demonstrate decreased motor control when returning to sitting, frequently "flopping" backwards.  OT increased height of dowel with pt then requiring increased upright posture and reach, however with increased backwards LOB requiring Min assist to lower to sitting.  Pt then directed to remove rings from vertical dowel to facilitate increased reach and balance.  Pt with 1 LOB backwards requiring mod assist to correct and OT providing cues for foot placement to increase BOS. Self-care: OT reviewed with pt and spouse  progress towards securing a BSC for the home, with referral placed by neurologist.  OT provided picture of what BSC should look like and advised for them to place it over toilet seat to decrease risk of tipping over when sitting on unsupported BSC.  OT also advised on awareness of BSC legs when turning to sit on BSC to now allow RW or feet to get caught on BSC legs.    PATIENT EDUCATION: Education details: see treatment note above Person educated: Patient and Spouse Education method: Explanation and Handouts Education comprehension: verbalized understanding and needs further  education  HOME EXERCISE PROGRAM: 06/25/23 - Handouts: V/c for sit-to-stand (see 06/25/23 pt instructions), additional copy of 05/14/23 "Bag Exercises" (see 05/14/23 pt instructions)  LSVT big exercises  Access Code: [email or web address] URL: [email or web address] Date: 06/23/2023 Prepared by: [facility] - [facility]  Exercises - Seated Figure 4 Piriformis Stretch  - 1 x daily - 10 reps - 15 sec hold - Seated Piriformis Stretch  - 1 x daily - 10 reps - 15 sec hold - Seated Single Knee to Chest  - 1 x daily - 10 reps - 5 sec hold  07/09/23 - Provided examples of no-tie lace options for LB dressing. (Handout, see pt instructions).   GOALS: Goals reviewed with patient? Yes  SHORT TERM GOALS: Target date: 05/23/23  Pt will be independent in full body HEP with focus on large amplitude movements as needed to increase independence with ADLs.  Baseline: Goal status: Not met - 05/26/23  2.  Pt will perform dynamic standing task for 5 mins as needed for simple IADLs w/o LOB using DME and/or countertop support prn  Baseline:  Goal status: IN PROGRESS  3.  Pt will demonstrate improved ease with sit > stand to get up from toilet or other seating options in home by improving score on sit > stand to decreased fall risk.  Baseline: time TBD Goal status: IN PROGRESS  4.  Pt will demonstrate  improved sitting balance and trunk control to reach towards floor to retreive items to allow progression towards LB dressing. Baseline:  Goal status: MET - 05/28/23   LONG TERM GOALS: Target date: 07/18/23  Pt will complete UB dressing at Supervision/setup level with use of AE and/or alternative strategies PRN.  Baseline: Min A Goal status: IN PROGRESS  2.  Pt will be able to don pants, socks, and shoes with Supervision/setup with use of AE and/or alternative strategies PRN.  Baseline:  Goal status: IN PROGRESS  3.  Pt will demonstrate increased ease with dressing as evidenced by decreasing PPT#4(don/ doff jacket) by 10 secs or more.  Baseline: 1:05 Goal status: IN PROGRESS  4.  Pt and spouse will be independent in PD specific HEP to facilitate increased ROM and amplitude as needed for ADLs and quality of life.  Baseline:  Goal status: IN PROGRESS   ASSESSMENT:  CLINICAL IMPRESSION: Pt tolerated tasks today though continues to benefit from v/c, therapist modeling, and extra time for safety and sequencing/processing of verbal instructions and of familiar motor movements. Pt continues to be limited in dressing due to PD as well as ankylosing spondylitis.    PERFORMANCE DEFICITS: in functional skills including ADLs, IADLs, coordination, dexterity, ROM, strength, pain, flexibility, Fine motor control, Gross motor control, balance, body mechanics, endurance, decreased knowledge of precautions, decreased knowledge of use of DME, and UE functional use and psychosocial skills including coping strategies, environmental adaptation, and routines and behaviors.   IMPAIRMENTS: are limiting patient from ADLs and IADLs.    PLAN:  OT FREQUENCY: 2x/week  OT DURATION: 4 weeks  PLANNED INTERVENTIONS: self care/ADL training, therapeutic exercise, therapeutic activity, neuromuscular re-education, balance training, functional mobility training, ultrasound, compression bandaging, moist heat,  cryotherapy, patient/family education, cognitive remediation/compensation, psychosocial skills training, energy conservation, coping strategies training, and DME and/or AE instructions  RECOMMENDED OTHER SERVICES: NA  CONSULTED AND AGREED WITH PLAN OF CARE: Patient  PLAN FOR NEXT SESSION:   Continue to review bag exercises for carryover to dressing Sit > stand Education: Safety, reducing fall risk UB  and LB dressing adaptive strategies   Wynetta Emery, OTR/L 07/09/2023, 3:12 PM   [facility] at [facility] [address] [place], [address] [place], [place] [address] Phone # [phone] Fax # [phone]

## 2023-07-14 ENCOUNTER — Ambulatory Visit: Payer: Medicare Other

## 2023-07-14 ENCOUNTER — Ambulatory Visit: Payer: Medicare Other | Admitting: Occupational Therapy

## 2023-07-14 DIAGNOSIS — R2681 Unsteadiness on feet: Secondary | ICD-10-CM

## 2023-07-14 DIAGNOSIS — R29818 Other symptoms and signs involving the nervous system: Secondary | ICD-10-CM | POA: Diagnosis not present

## 2023-07-14 DIAGNOSIS — M6281 Muscle weakness (generalized): Secondary | ICD-10-CM | POA: Diagnosis not present

## 2023-07-14 DIAGNOSIS — R293 Abnormal posture: Secondary | ICD-10-CM | POA: Diagnosis not present

## 2023-07-14 DIAGNOSIS — R2689 Other abnormalities of gait and mobility: Secondary | ICD-10-CM

## 2023-07-14 DIAGNOSIS — R278 Other lack of coordination: Secondary | ICD-10-CM

## 2023-07-14 DIAGNOSIS — G20A2 Parkinson's disease without dyskinesia, with fluctuations: Secondary | ICD-10-CM

## 2023-07-14 DIAGNOSIS — R262 Difficulty in walking, not elsewhere classified: Secondary | ICD-10-CM

## 2023-07-14 NOTE — Therapy (Signed)
OUTPATIENT OCCUPATIONAL THERAPY P[facility]IN[facility]S  Treatment Note   Patient Name: [name] MRN: [id number] DOB:September 05, 1935, 87 y.o., male Today's Date: 07/14/2023  PCP: Garlan Fillers, MD REFERRING PROVIDER: Huston Foley, MD     END OF SESSION:  OT End of Session - 07/14/23 1405     Visit Number 18    Number of Visits 20    Date for OT Re-Evaluation 07/18/23    Authorization Type Medicare A &B    OT Start Time 1404    OT Stop Time 1445    OT Time Calculation (min) 41 min    Equipment Utilized During Treatment gait belt    Activity Tolerance Patient tolerated treatment well    Behavior During Therapy WFL for tasks assessed/performed;Flat affect                             Past Medical History:  Diagnosis Date   Ankylosing spondylitis (HCC) dx'd ~ 1974   Arthritis    "back" (08/10/2015)   BENIGN PROSTATIC HYPERTROPHY, WITH OBSTRUCTION 01/15/2010   Bleeding duodenal ulcer    Bleeding esophageal ulcer    Bleeding stomach ulcer    GERD 02/22/2008   History of blood transfusion 1988   "lost ~ 1/2 of my blood volume from multiple bleeding ulcers"   History of hiatal hernia    HYPERGLYCEMIA 11/18/2007   HYPERLIPIDEMIA 02/22/2008   HYPERTENSION, UNSPECIFIED 11/10/2009   Kidney stones    Osteoarthritis    c-spine   PEPTIC ULCER DISEASE 11/17/2007   Situational depression    "son died in MVA 07-08-2015"   TIA (transient ischemic attack)    "not that I know of in the past; they are trying to determine if I've had one today (08/10/2015)"   Past Surgical History:  Procedure Laterality Date   CATARACT EXTRACTION W/ INTRAOCULAR LENS  IMPLANT, BILATERAL Bilateral 2013   Patient Active Problem List   Diagnosis Date Noted   Ankylosing spondylitis of cervicothoracic region (HCC) 10/16/2016   TIA (transient ischemic attack) 08/10/2015   Carotid stenosis 08/04/2014   Hyperlipidemia 02/21/2012   BENIGN PROSTATIC HYPERTROPHY, WITH OBSTRUCTION 01/15/2010    Essential hypertension 11/10/2009   GERD 02/22/2008   NEPHROLITHIASIS, HX OF 11/17/2007    ONSET DATE: referral date 03/25/23  REFERRING DIAG: G20.A2 (ICD-10-CM) - Parkinson's disease without dyskinesia, with fluctuations R26.81 (ICD-10-CM) - Unsteadiness on feet R41.841 (ICD-10-CM) - Cognitive communication deficit R27.8 (ICD-10-CM) - Other lack of coordination R26.2 (ICD-10-CM) - Difficulty in walking, not elsewhere classified  THERAPY DIAG:  Other symptoms and signs involving the nervous system  Other lack of coordination  Unsteadiness on feet  Muscle weakness (generalized)  Abnormal posture  Rationale for Evaluation and Treatment: Rehabilitation  SUBJECTIVE:   SUBJECTIVE STATEMENT: Pt reported things are going "reasonably well."  Pt accompanied by: self (dropped off by family friend, Sherilyn Cooter)  PERTINENT HISTORY: history of Parkinson's and ankylosing spondylitis   PRECAUTIONS: Fall  WEIGHT BEARING RESTRICTIONS: No  PAIN:  Are you having pain? 5/10 - "stinging" pain - Pt reports pain at R elbow d/t fall yesterday when pt scraped R elbow on brick fireplace.  FALLS: Has patient fallen in last 6 months? Yes. Number of falls has had multiple falls over the last few months with multiple in early July, and then 2 falls this month.  07/07/23 - Pt's spouse reports that he has not had a fall since Weds. 07/09/23 - Pt reports fall between midnight and  3 AM last night when returning from using the bathroom.  LIVING ENVIRONMENT: Lives with: lives with their spouse Lives in: House/apartment Stairs: Yes: External: 2-3 steps; has a ramp at entrance with raised railings Internal: steps to basement and steps to loft - however pt does not need to go up/down those at this time. Has following equipment at home: Dan Humphreys - 2 wheeled, Environmental consultant - 4 wheeled, bed side commode, Shower chair, Ramped entry, and transport chair  PLOF: Requires assistive device for independence and Needs assistance with  ADLs  PATIENT GOALS: to be able to move without concern/serious concern  OBJECTIVE:   HAND DOMINANCE: Right  ADLs: Overall ADLs: Pt reports there are times that he can be "fairly independent" with dressing but there are times where spouse has to provide significant assistance with. Transfers/ambulation related to ADLs: Utilizing RW and will occasionally walk bouts through the house without RW.  If pt loses balance backwards he is unable to correct. Eating: "fumble fingered" UB Dressing: occasional assistance LB Dressing: socks and shoes are quite difficult Toileting: "I go to the bathroom by myself" but has difficulty getting up/down from toilet Bathing: washing at the sink, spouse will assist  Tub Shower transfers: pt feels uncomfortable in the shower with shower chair, but has not fallen Equipment: Shower seat with back and Walk in shower with 5.5" ledge with 30" door  IADLs: Light housekeeping: washes dishes when he is feeling strong, however frequent onset of weakness Meal Prep: Pt was primary cook prior to fall in 2023, but has returned to participation in aspects of meal prep Community mobility: recently renewed drivers license, but is not driving  MOBILITY STATUS: Needs Assist: CGA with mobility with RW and Hx of falls   POSTURE COMMENTS:  rounded shoulders and forward head, posterior pelvic tilt and R lean  FUNCTIONAL OUTCOME MEASURES: Fastening/unfastening 3 buttons: unable to button any buttons in 1 mins Physical performance test: PPT#2 (simulated eating) 22.79 sec & PPT#4 (donning/doffing jacket): TBD  COORDINATION: Box and Blocks:  Right 30 blocks, Left 32 blocks  06/09/23: R: 39 blocks, L: 38 blocks  UE ROM:  all movements are slowed, decreased shoulder flexion d/t ankylosing spondylitis   UE MMT:    grossly 4/5 overall  COGNITION: Overall cognitive status:  Spouse reports thinking continues to be slower  OBSERVATIONS: Bradykinesia   TODAY'S TREATMENT:                                                                          DATE:  07/14/23 Self-care: Engaged in discussion with pt and spouse, reporting NP from Palliative care has been in contact with PCP to get pt started on meds to decrease bladder urgency/frequency to allow for increased safety and sleep at night.  Plans to f/u with NP in December for further intervention.   Toileting needs: discussed use of urinal at bedside to decrease frequency of ambulation to/from bathroom.  Pt and spouse fearful of use of BSC next to bed due to risk of falling backwards with stand>sit - preferring to keep it over the toilet. OT strongly recommending use or urinal to offset frequent trips to the toilet - pt willing to attempt. LB dressing: reiterated recommendations from previous therapy  sessions in regards to establishing a routine with dressing; utilizing chair with back/arm rests, step stool to increase reach towards feet, and use of elastic shoe laces to reduce need to tie shoes.   Transfers: engaged in sit > stand and stand pivot transfers to focus on large steps when turning.  Pt demonstrating improvements in step length when walking in from waiting room (pt with new RW), however with increased shuffling when turning to sit on mat.  OT providing cues for large steps when turning 90* to sit in chair with pt demonstrating improvements when turning to R, more so than when turning to L.      07/09/23  Therapeutic activities: Pt ambulated into clinic with gait belt and walker. Pt required extra time, demo'd freezing gait particularly when making turns, and benefited from  v/c and tactile cue to keep walker on floor when ambulating. OT educated pt on decreasing fall risk by using bedside commode instead of walking to standard bathroom at night. Pt verbalized understanding though expressed concerns that there may be difficulties with rearranging furniture. Pt stated "let's just change one thing at a  time." Sit-to-stand and placing resistive clips - to improve BUE strengthening and FM coordination, to improve safety with sit-to-stand transition. - With extra time, pt verbalized and demo'd steps of sit-to-stand with min v/c for recall and min assistance for transition. Pt placed x8 yellow clips and x4 red clips with extra time and v/c for processing/recall of specified colors. Self-care -  LB dressing task of donning/doffing shoe/sock - to increase ind with LB dressing ADL tasks, to improve forward functional reach for ADL tasks, to address sequencing and processing for ADL tasks. - OT educated pt on use of reacher for LB dressing, use of step stool, and no-tie laces options (see pt instructions, handout provided). Pt demo'd difficulty with reacher therefore task was reverted to forward reach to remove shoes/socks. Pt demo'd increased success when using step stool to remove socks/shoes and therefore OT recommended continuing to use step stool at home for LB dressing. Pt verbalized understanding. Pt demo'd significant difficulty sequencing motor movements for tying shoes despite v/c, therapist modeling, and extra time. OT educated pt on no-tie laces to increase ind for LB dressing tasks. Pt verbalized understanding.  07/07/23 Large amplitude: engaged in forward and overhead reach with 2# medicine ball x10 with target to facilitate increased amplitude. Engaged in reaching around back to pass ball from R<> L hand in both directions to increase ROM as needed when tucking in shirt, pulling up pants, donning belt, etc.  Then attempting to pass ball around at shoulders, however despite use of mirror pt with inability to sequence cognitively as well as decreased shoulder ROM due to ankylosing spondylitis.  Transitioned to tossing towel over shoulder and reaching down below with opposite hand to retreive at waist line.  Pt with improved ability on R> L.  AE: discussed magnetic button shirts vs pull over vs donning  button up in pull over manner, discussed button hooks; discussed sock aid.  Pt has attempted button hook during previous bout with OT with no real benefit for him at the time.  Pt has had sock aid demonstrated to him, however again too difficult to even don sock to sock aid due to tight fit of pt's socks. Sit>stand: engaged in sit>stand with use of mirror for visual feedback.  Pt completing good sit > stand 2/4 attempts and good control with stand > sit 3/4 attempts.  Pt still  demonstrating freezing and cues for technique.   PATIENT EDUCATION: Education details: see treatment note above Person educated: Patient and Spouse Education method: Explanation and Handouts Education comprehension: verbalized understanding and needs further education  HOME EXERCISE PROGRAM: 06/25/23 - Handouts: V/c for sit-to-stand (see 06/25/23 pt instructions), additional copy of 05/14/23 "Bag Exercises" (see 05/14/23 pt instructions)  LSVT big exercises  Access Code: [email or web address] URL: [email or web address] Date: 06/23/2023 Prepared by: [facility] - Outpatient  Rehab - [facility]  Exercises - Seated Figure 4 Piriformis Stretch  - 1 x daily - 10 reps - 15 sec hold - Seated Piriformis Stretch  - 1 x daily - 10 reps - 15 sec hold - Seated Single Knee to Chest  - 1 x daily - 10 reps - 5 sec hold  07/09/23 - Provided examples of no-tie lace options for LB dressing. (Handout, see pt instructions).   GOALS: Goals reviewed with patient? Yes  SHORT TERM GOALS: Target date: 05/23/23  Pt will be independent in full body HEP with focus on large amplitude movements as needed to increase independence with ADLs.  Baseline: Goal status: Not met - 05/26/23  2.  Pt will perform dynamic standing task for 5 mins as needed for simple IADLs w/o LOB using DME and/or countertop support prn  Baseline:  Goal status: IN PROGRESS  3.  Pt will demonstrate improved ease with sit > stand to get up from toilet or other  seating options in home by improving score on sit > stand to decreased fall risk.  Baseline: time TBD Goal status: IN PROGRESS  4.  Pt will demonstrate improved sitting balance and trunk control to reach towards floor to retreive items to allow progression towards LB dressing. Baseline:  Goal status: MET - 05/28/23   LONG TERM GOALS: Target date: 07/18/23  Pt will complete UB dressing at Supervision/setup level with use of AE and/or alternative strategies PRN.  Baseline: Min A Goal status: IN PROGRESS  2.  Pt will be able to don pants, socks, and shoes with Supervision/setup with use of AE and/or alternative strategies PRN.  Baseline:  Goal status: IN PROGRESS  3.  Pt will demonstrate increased ease with dressing as evidenced by decreasing PPT#4(don/ doff jacket) by 10 secs or more.  Baseline: 1:05 Goal status: IN PROGRESS  4.  Pt and spouse will be independent in PD specific HEP to facilitate increased ROM and amplitude as needed for ADLs and quality of life.  Baseline:  Goal status: IN PROGRESS   ASSESSMENT:  CLINICAL IMPRESSION: OT reviewed current cert length with focus on increasing safety in home with ADLs and implementing HEP for large amplitude movements to carry over to quality of movement and increased ease with ADLs.  Pt and wife report needing to establish a routine and setup a space to allow pt to utilize recommendations for LB dressing to increase ease with LB dressing.  Pt demonstrating improvements with mobility with new RW when ambulating in straight lines, continues to demonstrate decreased stepping length and pattern when turning.  Pt continues to be limited in dressing due to PD as well as ankylosing spondylitis.    PERFORMANCE DEFICITS: in functional skills including ADLs, IADLs, coordination, dexterity, ROM, strength, pain, flexibility, Fine motor control, Gross motor control, balance, body mechanics, endurance, decreased knowledge of precautions, decreased  knowledge of use of DME, and UE functional use and psychosocial skills including coping strategies, environmental adaptation, and routines and behaviors.   IMPAIRMENTS: are limiting patient from ADLs and  IADLs.    PLAN:  OT FREQUENCY: 2x/week  OT DURATION: 4 weeks  PLANNED INTERVENTIONS: self care/ADL training, therapeutic exercise, therapeutic activity, neuromuscular re-education, balance training, functional mobility training, ultrasound, compression bandaging, moist heat, cryotherapy, patient/family education, cognitive remediation/compensation, psychosocial skills training, energy conservation, coping strategies training, and DME and/or AE instructions  RECOMMENDED OTHER SERVICES: NA  CONSULTED AND AGREED WITH PLAN OF CARE: Patient  PLAN FOR NEXT SESSION:   Re-cert vs D/C Continue to review bag exercises for carryover to dressing Sit > stand Education: Safety, reducing fall risk UB and LB dressing adaptive strategies    Clennon Nasca, OTR/L 07/14/2023, 2:06 PM   [facility] at [facility] [address], [address] [place], [place] [address] Phone # [phone] Fax # [phone]

## 2023-07-14 NOTE — Therapy (Signed)
OUTPATIENT PHYSICAL THERAPY NEURO TREATMENT NOTE  Patient Name: [name] MRN: [id number] DOB:March 20, 1936, 87 y.o., male Today's Date: 07/14/2023   PCP: Garlan Fillers REFERRING PROVIDER: Huston Foley, MD     END OF SESSION:  PT End of Session - 07/14/23 1458     Visit Number 20    Number of Visits 22    Date for PT Re-Evaluation 07/17/23    Authorization Type Medicare/[employer]-NEEDS KX due to previous PT and speech this year    Progress Note Due on Visit 24    PT Start Time 1445    PT Stop Time 1530    PT Time Calculation (min) 45 min    Equipment Utilized During Treatment Gait belt    Activity Tolerance Patient tolerated treatment well    Behavior During Therapy WFL for tasks assessed/performed                          Past Medical History:  Diagnosis Date   Ankylosing spondylitis (HCC) dx'd ~ 1974   Arthritis    "back" (08/10/2015)   BENIGN PROSTATIC HYPERTROPHY, WITH OBSTRUCTION 01/15/2010   Bleeding duodenal ulcer    Bleeding esophageal ulcer    Bleeding stomach ulcer    GERD 02/22/2008   History of blood transfusion 1988   "lost ~ 1/2 of my blood volume from multiple bleeding ulcers"   History of hiatal hernia    HYPERGLYCEMIA 11/18/2007   HYPERLIPIDEMIA 02/22/2008   HYPERTENSION, UNSPECIFIED 11/10/2009   Kidney stones    Osteoarthritis    c-spine   PEPTIC ULCER DISEASE 11/17/2007   Situational depression    "son died in MVA 06-29-2015"   TIA (transient ischemic attack)    "not that I know of in the past; they are trying to determine if I've had one today (08/10/2015)"   Past Surgical History:  Procedure Laterality Date   CATARACT EXTRACTION W/ INTRAOCULAR LENS  IMPLANT, BILATERAL Bilateral 2013   Patient Active Problem List   Diagnosis Date Noted   Ankylosing spondylitis of cervicothoracic region (HCC) 10/16/2016   TIA (transient ischemic attack) 08/10/2015   Carotid stenosis 08/04/2014   Hyperlipidemia 02/21/2012   BENIGN PROSTATIC  HYPERTROPHY, WITH OBSTRUCTION 01/15/2010   Essential hypertension 11/10/2009   GERD 02/22/2008   NEPHROLITHIASIS, HX OF 11/17/2007    ONSET DATE: "several years"  REFERRING DIAG: G20.A2 (ICD-10-CM) - Parkinson's disease with fluctuating manifestations, unspecified whether dyskinesia present R26.81 (ICD-10-CM) - Unsteadiness on feet R41.841 (ICD-10-CM) - Cognitive communication deficit R27.8 (ICD-10-CM) - Other lack of coordination R26.2 (ICD-10-CM) - Difficulty in walking, not elsewhere classified  THERAPY DIAG:  Unsteadiness on feet  Muscle weakness (generalized)  Abnormal posture  Other abnormalities of gait and mobility  Parkinson's disease with fluctuating manifestations, unspecified whether dyskinesia present (HCC)  Difficulty in walking, not elsewhere classified  Rationale for Evaluation and Treatment: Rehabilitation  SUBJECTIVE:  SUBJECTIVE STATEMENT: Experienced two falls over the weekend.  Pt accompanied by: significant other  PERTINENT HISTORY: PD, complex medical history of peptic ulcer disease with history of upper GI bleed, TIA, depression, degenerative disc disease in the neck, osteoarthritis, kidney stones, hyperlipidemia, hypertension, history of hiatal hernia, BPH, arthritis, and ankylosing spondylitis, and parkinsonism   PAIN:  Are you having pain? Having some discomfort in right elbow from recent fall  PRECAUTIONS: Fall  RED FLAGS: None   WEIGHT BEARING RESTRICTIONS: No  FALLS: Has patient fallen in last 6 months? Yes. Number of falls unknown, multiple  LIVING ENVIRONMENT: Lives with: lives with their spouse Lives in: House/apartment Stairs: Ramp to enter, stairs to loft and basement, ground floor set-up Has following equipment at home: Dan Humphreys - 2 wheeled  PLOF:  Needs assistance with ADLs, Needs assistance with homemaking, and Needs assistance with gait, supervision for ambulation at household distances  PATIENT GOALS: improve balance, walking, reduce falls  OBJECTIVE:   TODAY'S TREATMENT: 07/14/23 Activity Comments  NU-step level 4 x 10 min -performed with dual-tasking of Stroop cards w/ emphasis on maintaining 70-80 SPM  Gait training -stair ambulation with agility ladder on ground for step length. Trials with 6" hurdle and airex pad in pathway with cones at ends to negotiate 180 deg turns -gait w/ RW and SBA-CGA with negotiation of turns and doorways to improve safety with change of direction                     PATIENT EDUCATION: Education details: Backwards direction balance practice:  BIGGER, WIDER, SLOWER steps with bringing hips forward with quick stop Person educated: Patient and Spouse Education method: Explanation, Demonstration, and Verbal cues Education comprehension: verbalized understanding and needs further education  Access Code: [email or web address] URL: [email or web address] Date: 05/08/2023 Prepared by: [facility] - Outpatient  Rehab - [facility]  Program Notes perform with wife's supervision  Exercises - Mini Squat with Counter Support  - 1 x daily - 5 x weekly - 2 sets - 10 reps - Staggered Stance Forward Backward Weight Shift with Counter Support  - 1 x daily - 5 x weekly - 2 sets - 10 reps - Standing Quarter Turn with Counter Support  - 1 x daily - 5 x weekly - 2 sets - 5 reps - Seated Long Arc Quad with Ankle Weight  - 1 x daily - 5 x weekly - 3 sets - 10 reps - Seated Hip Flexion March with Ankle Weights  - 1 x daily - 5 x weekly - 3 sets - 10 reps - Seated Active Hip Flexion  - 1 x daily - 7 x weekly - 1 sets - 10 reps - 5-10 sec hold        ------------------------------------------------- Objective measures below taken at initial evaluation:  DIAGNOSTIC FINDINGS: n/a for  episode  COGNITION: Overall cognitive status: History of cognitive impairments - at baseline   SENSATION: Neuropathy in bilat feet  COORDINATION: Difficulty with rapid alternating movements    MUSCLE TONE: NT   POSTURE: rounded shoulders, forward head, flexed trunk , and head down , kyphosis  LOWER EXTREMITY ROM:     Active  Right Eval Left Eval  Hip flexion    Hip extension    Hip abduction    Hip adduction    Hip internal rotation    Hip external rotation    Knee flexion 120 120  Knee extension 0 0  Ankle dorsiflexion 5 12  Ankle plantarflexion  Ankle inversion    Ankle eversion     (Blank rows = not tested)  LOWER EXTREMITY MMT:    BLE strength grossly 3+/5  BED MOBILITY:  Mod A  TRANSFERS: Assistive device utilized: Environmental consultant - 2 wheeled  Sit to stand: CGA and Min A Stand to sit: SBA Chair to chair: SBA and CGA Floor: Mod A and Max A  RAMP:  Level of Assistance: CGA Assistive device utilized: Environmental consultant - 2 wheeled Ramp Comments:   CURB:  Level of Assistance: CGA Assistive device utilized: Environmental consultant - 2 wheeled Curb Comments:   STAIRS: NT  GAIT: Gait pattern: step to pattern, decreased stride length, and poor foot clearance- Right Distance walked: 100 Assistive device utilized: Walker - 2 wheeled Level of assistance: SBA and CGA Comments: level surfaces  FUNCTIONAL TESTS:  5 times sit to stand: 45 sec w/ CGA Timed up and go (TUG): 1 min 27 sec  M-CTSIB  Condition 1: Firm Surface, EO 10 Sec, Severe and retro-LOB  Sway  Condition 2: Firm Surface, EC 3 Sec, Severe and retro-LOB  Sway  Condition 3: Foam Surface, EO  Sec,  Sway  Condition 4: Foam Surface, EC  Sec,  Sway       GOALS: Goals reviewed with patient? Yes  SHORT TERM GOALS: UPDATED Target date: 05/15/2023>05/23/2023    Patient will be independent in HEP to improve functional outcomes Baseline: has been reviewed with PT and wife previously 06/04/23 Goal status: MET 06/04/23  2.   Teach-back strategies to reduce freezing of gait during turns Baseline: 16 steps to complete 180 deg turn; unable to recall 06/04/23 Goal status: NOT MET 06/04/23  3.  Demo improved safety and independence with sit to stand completing transfers with set-up assist Baseline: CGA-min A; unchanged 06/04/23 Goal status: NOT MET 06/04/23    LONG TERM GOALS: UPDATED Target date: 06/05/2023>06/20/2023> 07/17/2023      Demo reduced risk for falls per time 35 sec TUG test Baseline: 1 min 27 sec; 1 min 15 sec 06/04/23; 54 sec 06/19/23 Goal status: IN PROGRESS 06/04/23  2.  Demo improved BLE strength and balance per time of 25 sec 5xSTS test to increase independence with mobility Baseline: 45 sec; 1 min 33 sec with occasional min A and consistent verbal cues 06/04/23; 1 min 18 sec 06/19/23 Goal status: IN PROGRESS   3.  Demo improved safety with ambulation on level surfaces w/ supervision x 150 ft Baseline: SBA-CGA; occasional min A for balance and cueing to reduce freezing for 160 ft 06/04/23; 150 ft w/ supervision level surfaces x 2 min 45 sec Goal status: MET  4.  Improve gait speed to 1.5 ft/sec to improve efficiency of ambulation  Baseline: 0.9 ft/sec  Goal status: INITIAL  ASSESSMENT:  CLINICAL IMPRESSION: Initiated with NU-step for rapid alternating movements and combination of dual-tasking with Stroop cards with cues to maintain SPM 70-80 and frequent cues needed for cognitive task with disturbance to steps per minute.  Gait training with heavy visual cues to improve step length and negotiating turns for change of direction to reduce freezing and LOB requiring CGA-min A for postural stability during freezing episodes with cues in strategies to overcome and re-initiate ambulation.  Will perform re-assessment at next session to determine POC details  OBJECTIVE IMPAIRMENTS: Abnormal gait, decreased activity tolerance, decreased balance, decreased coordination, decreased mobility, difficulty  walking, decreased ROM, decreased strength, improper body mechanics, and postural dysfunction.   ACTIVITY LIMITATIONS: carrying, lifting, standing, stairs, transfers, bed mobility, and  locomotion level  PARTICIPATION LIMITATIONS: meal prep, cleaning, laundry, interpersonal relationship, community activity, and exercise routine  PERSONAL FACTORS: Age, Time since onset of injury/illness/exacerbation, and 3+ comorbidities: PMH  are also affecting patient's functional outcome.   REHAB POTENTIAL: Good  CLINICAL DECISION MAKING: Evolving/moderate complexity  EVALUATION COMPLEXITY: Moderate  PLAN:  PT FREQUENCY: 2x/week  PT DURATION: 4 weeks  PLANNED INTERVENTIONS: Therapeutic exercises, Therapeutic activity, Neuromuscular re-education, Balance training, Gait training, Patient/Family education, Self Care, and Joint mobilization  PLAN FOR NEXT SESSION: Re-assessment recert vs D/C?   2:58 PM, 07/14/23 M. Shary Decamp, PT, DPT Physical Therapist- [place] Office Number: [phone]

## 2023-07-15 DIAGNOSIS — L570 Actinic keratosis: Secondary | ICD-10-CM | POA: Diagnosis not present

## 2023-07-15 DIAGNOSIS — L218 Other seborrheic dermatitis: Secondary | ICD-10-CM | POA: Diagnosis not present

## 2023-07-16 ENCOUNTER — Ambulatory Visit: Payer: Medicare Other

## 2023-07-16 ENCOUNTER — Ambulatory Visit: Payer: Medicare Other | Admitting: Occupational Therapy

## 2023-07-16 DIAGNOSIS — R2681 Unsteadiness on feet: Secondary | ICD-10-CM

## 2023-07-16 DIAGNOSIS — R293 Abnormal posture: Secondary | ICD-10-CM | POA: Diagnosis not present

## 2023-07-16 DIAGNOSIS — R2689 Other abnormalities of gait and mobility: Secondary | ICD-10-CM

## 2023-07-16 DIAGNOSIS — R29818 Other symptoms and signs involving the nervous system: Secondary | ICD-10-CM | POA: Diagnosis not present

## 2023-07-16 DIAGNOSIS — G20A2 Parkinson's disease without dyskinesia, with fluctuations: Secondary | ICD-10-CM | POA: Diagnosis not present

## 2023-07-16 DIAGNOSIS — R262 Difficulty in walking, not elsewhere classified: Secondary | ICD-10-CM

## 2023-07-16 DIAGNOSIS — M6281 Muscle weakness (generalized): Secondary | ICD-10-CM

## 2023-07-16 DIAGNOSIS — R278 Other lack of coordination: Secondary | ICD-10-CM

## 2023-07-16 NOTE — Therapy (Signed)
OUTPATIENT OCCUPATIONAL THERAPY PARKINSON'S  Treatment Note   Patient Name: [name] MRN: [id number] DOB:Dec 17, 1935, 87 y.o., male Today's Date: 07/17/2023  PCP: Garlan Fillers, MD REFERRING PROVIDER: Huston Foley, MD     END OF SESSION:  OT End of Session - 07/16/23 1408     Visit Number 19    Number of Visits 25    Date for OT Re-Evaluation 08/29/23    Authorization Type Medicare A &B    OT Start Time 1402    OT Stop Time 1444    OT Time Calculation (min) 42 min    Equipment Utilized During Treatment gait belt    Activity Tolerance Patient tolerated treatment well    Behavior During Therapy WFL for tasks assessed/performed;Flat affect                              Past Medical History:  Diagnosis Date   Ankylosing spondylitis (HCC) dx'd ~ 1974   Arthritis    "back" (08/10/2015)   BENIGN PROSTATIC HYPERTROPHY, WITH OBSTRUCTION 01/15/2010   Bleeding duodenal ulcer    Bleeding esophageal ulcer    Bleeding stomach ulcer    GERD 02/22/2008   History of blood transfusion 1988   "lost ~ 1/2 of my blood volume from multiple bleeding ulcers"   History of hiatal hernia    HYPERGLYCEMIA 11/18/2007   HYPERLIPIDEMIA 02/22/2008   HYPERTENSION, UNSPECIFIED 11/10/2009   Kidney stones    Osteoarthritis    c-spine   PEPTIC ULCER DISEASE 11/17/2007   Situational depression    "son died in MVA Jun 29, 2015"   TIA (transient ischemic attack)    "not that I know of in the past; they are trying to determine if I've had one today (08/10/2015)"   Past Surgical History:  Procedure Laterality Date   CATARACT EXTRACTION W/ INTRAOCULAR LENS  IMPLANT, BILATERAL Bilateral 2013   Patient Active Problem List   Diagnosis Date Noted   Ankylosing spondylitis of cervicothoracic region (HCC) 10/16/2016   TIA (transient ischemic attack) 08/10/2015   Carotid stenosis 08/04/2014   Hyperlipidemia 02/21/2012   BENIGN PROSTATIC HYPERTROPHY, WITH OBSTRUCTION 01/15/2010    Essential hypertension 11/10/2009   GERD 02/22/2008   NEPHROLITHIASIS, HX OF 11/17/2007    ONSET DATE: referral date 03/25/23  REFERRING DIAG: G20.A2 (ICD-10-CM) - Parkinson's disease without dyskinesia, with fluctuations R26.81 (ICD-10-CM) - Unsteadiness on feet R41.841 (ICD-10-CM) - Cognitive communication deficit R27.8 (ICD-10-CM) - Other lack of coordination R26.2 (ICD-10-CM) - Difficulty in walking, not elsewhere classified  THERAPY DIAG:  Unsteadiness on feet  Abnormal posture  Muscle weakness (generalized)  Other symptoms and signs involving the nervous system  Other lack of coordination  Rationale for Evaluation and Treatment: Rehabilitation  SUBJECTIVE:   SUBJECTIVE STATEMENT: Pt reports that he has been leaving his old RW in the bathroom and using new RW to ambulate to/from. Last night he got the 2 walkers "tangled up" and lost his balance, hitting his elbow on the shower door.    Pt reports that he wants to continue with therapy at some point as he is concerned that without continued therapy he may lose the progress that he has made.    Pt accompanied by: self (spouse, [name])  PERTINENT HISTORY: history of Parkinson's and ankylosing spondylitis   PRECAUTIONS: Fall  WEIGHT BEARING RESTRICTIONS: No  PAIN:  Are you having pain? No  FALLS: Has patient fallen in last 6 months? Yes. Number of falls  has had multiple falls over the last few months with multiple in early July, and then 2 falls this month.  07/07/23 - Pt's spouse reports that he has not had a fall since Weds. 07/09/23 - Pt reports fall between midnight and 3 AM last night when returning from using the bathroom.  LIVING ENVIRONMENT: Lives with: lives with their spouse Lives in: House/apartment Stairs: Yes: External: 2-3 steps; has a ramp at entrance with raised railings Internal: steps to basement and steps to loft - however pt does not need to go up/down those at this time. Has following equipment at  home: Dan Humphreys - 2 wheeled, Environmental consultant - 4 wheeled, bed side commode, Shower chair, Ramped entry, and transport chair  PLOF: Requires assistive device for independence and Needs assistance with ADLs  PATIENT GOALS: to be able to move without concern/serious concern  OBJECTIVE:   HAND DOMINANCE: Right  ADLs: Overall ADLs: Pt reports there are times that he can be "fairly independent" with dressing but there are times where spouse has to provide significant assistance with. Transfers/ambulation related to ADLs: Utilizing RW and will occasionally walk bouts through the house without RW.  If pt loses balance backwards he is unable to correct. Eating: "fumble fingered" UB Dressing: occasional assistance LB Dressing: socks and shoes are quite difficult Toileting: "I go to the bathroom by myself" but has difficulty getting up/down from toilet Bathing: washing at the sink, spouse will assist  Tub Shower transfers: pt feels uncomfortable in the shower with shower chair, but has not fallen Equipment: Shower seat with back and Walk in shower with 5.5" ledge with 30" door  IADLs: Light housekeeping: washes dishes when he is feeling strong, however frequent onset of weakness Meal Prep: Pt was primary cook prior to fall in 2023, but has returned to participation in aspects of meal prep Community mobility: recently renewed drivers license, but is not driving  MOBILITY STATUS: Needs Assist: CGA with mobility with RW and Hx of falls   POSTURE COMMENTS:  rounded shoulders and forward head, posterior pelvic tilt and R lean  FUNCTIONAL OUTCOME MEASURES: Fastening/unfastening 3 buttons: unable to button any buttons in 1 mins Physical performance test: PPT#2 (simulated eating) 22.79 sec & PPT#4 (donning/doffing jacket): TBD  COORDINATION: Box and Blocks:  Right 30 blocks, Left 32 blocks  06/09/23: R: 39 blocks, L: 38 blocks  UE ROM:  all movements are slowed, decreased shoulder flexion d/t ankylosing  spondylitis   UE MMT:    grossly 4/5 overall  COGNITION: Overall cognitive status:  Spouse reports thinking continues to be slower  OBSERVATIONS: Bradykinesia   TODAY'S TREATMENT:                                                                         DATE:  07/16/23 ADL: reiterated large amplitude, intentional movements with dressing and aspects of self-care tasks. OT providing demonstration with aspects of bathing and dressing.  Provided with handout - see pt instructions. Self-care: spouse reports that they have an upcoming in person meeting with Palliative care to get in-home care/physical assist. Large amplitude: engaged in modified LSVT big exercises to carry over to decreased burden of care with transitional movements and dressing. Pt demonstrating improved forward and downward  reach when given targets to reach towards. OT providing verbal cues for upright posture when reaching up and out to side.  OT providing encouragement and recommendations to increase upright posture during exercises at home. Pt demonstrating increased amplitude with repetition.    07/14/23 Self-care: Engaged in discussion with pt and spouse, reporting NP from Palliative care has been in contact with PCP to get pt started on meds to decrease bladder urgency/frequency to allow for increased safety and sleep at night.  Plans to f/u with NP in December for further intervention.   Toileting needs: discussed use of urinal at bedside to decrease frequency of ambulation to/from bathroom.  Pt and spouse fearful of use of BSC next to bed due to risk of falling backwards with stand>sit - preferring to keep it over the toilet. OT strongly recommending use or urinal to offset frequent trips to the toilet - pt willing to attempt. LB dressing: reiterated recommendations from previous therapy sessions in regards to establishing a routine with dressing; utilizing chair with back/arm rests, step stool to increase reach towards feet,  and use of elastic shoe laces to reduce need to tie shoes.   Transfers: engaged in sit > stand and stand pivot transfers to focus on large steps when turning.  Pt demonstrating improvements in step length when walking in from waiting room (pt with new RW), however with increased shuffling when turning to sit on mat.  OT providing cues for large steps when turning 90* to sit in chair with pt demonstrating improvements when turning to R, more so than when turning to L.      07/09/23  Therapeutic activities: Pt ambulated into clinic with gait belt and walker. Pt required extra time, demo'd freezing gait particularly when making turns, and benefited from  v/c and tactile cue to keep walker on floor when ambulating. OT educated pt on decreasing fall risk by using bedside commode instead of walking to standard bathroom at night. Pt verbalized understanding though expressed concerns that there may be difficulties with rearranging furniture. Pt stated "let's just change one thing at a time." Sit-to-stand and placing resistive clips - to improve BUE strengthening and FM coordination, to improve safety with sit-to-stand transition. - With extra time, pt verbalized and demo'd steps of sit-to-stand with min v/c for recall and min assistance for transition. Pt placed x8 yellow clips and x4 red clips with extra time and v/c for processing/recall of specified colors. Self-care -  LB dressing task of donning/doffing shoe/sock - to increase ind with LB dressing ADL tasks, to improve forward functional reach for ADL tasks, to address sequencing and processing for ADL tasks. - OT educated pt on use of reacher for LB dressing, use of step stool, and no-tie laces options (see pt instructions, handout provided). Pt demo'd difficulty with reacher therefore task was reverted to forward reach to remove shoes/socks. Pt demo'd increased success when using step stool to remove socks/shoes and therefore OT recommended continuing to  use step stool at home for LB dressing. Pt verbalized understanding. Pt demo'd significant difficulty sequencing motor movements for tying shoes despite v/c, therapist modeling, and extra time. OT educated pt on no-tie laces to increase ind for LB dressing tasks. Pt verbalized understanding.   PATIENT EDUCATION: Education details: see treatment note above Person educated: Patient and Spouse Education method: Explanation and Handouts Education comprehension: verbalized understanding and needs further education  HOME EXERCISE PROGRAM: 06/25/23 - Handouts: V/c for sit-to-stand (see 06/25/23 pt instructions), additional copy of 05/14/23 "  Bag Exercises" (see 05/14/23 pt instructions)  LSVT big exercises  Access Code: [email or web address] URL: [email or web address] Date: 06/23/2023 Prepared by: [facility] - Outpatient  Rehab - [facility]  Exercises - Seated Figure 4 Piriformis Stretch  - 1 x daily - 10 reps - 15 sec hold - Seated Piriformis Stretch  - 1 x daily - 10 reps - 15 sec hold - Seated Single Knee to Chest  - 1 x daily - 10 reps - 5 sec hold  07/09/23 - Provided examples of no-tie lace options for LB dressing. (Handout, see pt instructions).   GOALS: Goals reviewed with patient? Yes  SHORT TERM GOALS: Target date: 05/23/23  Pt will be independent in full body HEP with focus on large amplitude movements as needed to increase independence with ADLs.  Baseline: Goal status: Not met - 05/26/23  2.  Pt will perform dynamic standing task for 5 mins as needed for simple IADLs w/o LOB using DME and/or countertop support prn  Baseline:  Goal status: IN PROGRESS  3.  Pt will demonstrate improved ease with sit > stand to get up from toilet or other seating options in home by improving score on sit > stand to decreased fall risk.  Baseline: time TBD Goal status: IN PROGRESS  4.  Pt will demonstrate improved sitting balance and trunk control to reach towards floor to retreive  items to allow progression towards LB dressing. Baseline:  Goal status: MET - 05/28/23   LONG TERM GOALS: Target date: 07/18/23  Pt will complete UB dressing at Supervision/setup level with use of AE and/or alternative strategies PRN.  Baseline: Min A Goal status: IN PROGRESS  2.  Pt will be able to don pants, socks, and shoes with Supervision/setup with use of AE and/or alternative strategies PRN.  Baseline:  Goal status: IN PROGRESS  3.  Pt will demonstrate increased ease with dressing as evidenced by decreasing PPT#4(don/ doff jacket) by 10 secs or more.  Baseline: 1:05 Goal status: IN PROGRESS  4.  Pt and spouse will be independent in PD specific HEP to facilitate increased ROM and amplitude as needed for ADLs and quality of life.  Baseline:  Goal status: IN PROGRESS  NEW LONG TERM GOALS: Target date: 08/29/23  Pt will report ability to complete UB/LB dressing at Supervision/setup level with use of AE and/or alternative strategies PRN.  Baseline: Min A Goal status: REVISED  2.  Pt and spouse will report understanding of adaptive techniques/strategies and/or modifications to routines to increase safety with mobility and self-care tasks in the home.  Baseline:  Goal status: INITIAL  3.  Pt will demonstrate increased ease with dressing as evidenced by decreasing PPT#4(don/ doff jacket) by 10 secs or more.  Baseline: 1:05 Goal status: IN PROGRESS  4.  Pt and spouse will be independent in updated PD specific HEP to facilitate increased ROM and amplitude as needed for ADLs and quality of life.  Baseline:  Goal status: IN PROGRESS  ASSESSMENT:  CLINICAL IMPRESSION: OT reviewed current cert length with focus on increasing safety in home with ADLs and implementing HEP for large amplitude movements to carry over to quality of movement and increased ease with ADLs.  Pt and spouse expressing desire to continue working with therapy due to fearfulness of regression without services.   Plan to re-cert for 1x/week for more focus on exercises with transition to maintenance plan and/or engagement in exercises at home/gym.  May benefit from return eval in <6 months due to high  fall risk.  PERFORMANCE DEFICITS: in functional skills including ADLs, IADLs, coordination, dexterity, ROM, strength, pain, flexibility, Fine motor control, Gross motor control, balance, body mechanics, endurance, decreased knowledge of precautions, decreased knowledge of use of DME, and UE functional use and psychosocial skills including coping strategies, environmental adaptation, and routines and behaviors.   IMPAIRMENTS: are limiting patient from ADLs and IADLs.    PLAN:  OT FREQUENCY: 1x/week  OT DURATION: 6 weeks  PLANNED INTERVENTIONS: self care/ADL training, therapeutic exercise, therapeutic activity, neuromuscular re-education, balance training, functional mobility training, ultrasound, compression bandaging, moist heat, cryotherapy, patient/family education, cognitive remediation/compensation, psychosocial skills training, energy conservation, coping strategies training, and DME and/or AE instructions  RECOMMENDED OTHER SERVICES: NA  CONSULTED AND AGREED WITH PLAN OF CARE: Patient  PLAN FOR NEXT SESSION:   Continue to review bag exercises for carryover to dressing Large amplitude and LSVT big exercises Sit > stand Education: Safety, reducing fall risk UB and LB dressing adaptive strategies    Nikia Levels, OTR/L 07/17/2023, 7:34 AM   [facility] [address], [address] [place], [place] [address] Phone # [phone] Fax # [phone]

## 2023-07-16 NOTE — Patient Instructions (Addendum)
Performing Daily Activities with Big Movements  Pick at least one activity a day and perform with BIG, DELIBERATE movements/effort. This can make the activity easier and turn daily activities into exercise!  If you are standing during the activity, make sure to keep feet apart and stand with good/big/PWR! UP posture.  Examples: Dressing - Push arms in sleeves, twist when putting on jacket, push foot into pants, open hands to pull down shirt/put on socks/pull up pants Bathing - Wash/dry with long strokes Brushing your teeth - Big, slow movements Cutting food - Long deliberate cuts Opening jar/bottle - Move as much as you can with each turn Picking up a cup/bottle - Open hand up big and get object all the way in palm Hanging up clothes/getting clothes down from closet - Reach with big effort Putting away groceries/dishes - Reach with big effort Wiping counter/table - Move in big, long strokes Stirring while cooking - Exaggerate movement Cleaning windows - Move in big, long strokes Sweeping - Move arms in big, long strokes Vacuuming - Push with big movement Folding clothes - Exaggerate arm movements Washing car - Move in big, long strokes Raking - Move arms in big, long strokes Changing light bulb - Move as much as you can with each turn Using a screwdriver - Move as much as you can with each turn Walking into a store/restaurant - Walk with big steps, swing arms if able Standing up from a chair/recliner/sofa - Scoot forward, lean forward, and stand with big effort 

## 2023-07-16 NOTE — Therapy (Signed)
OUTPATIENT PHYSICAL THERAPY NEURO TREATMENT NOTE, Progress Note, and Recertification  Patient Name: [name] MRN: [id number] DOB:Feb 23, 1936, 87 y.o., male Today's Date: 07/16/2023   PCP: Garlan Fillers REFERRING PROVIDER: Huston Foley, MD     END OF SESSION:  PT End of Session - 07/16/23 1449     Visit Number 21    Number of Visits 27    Date for PT Re-Evaluation 08/27/23    Authorization Type Medicare/[employer]-NEEDS KX due to previous PT and speech this year    Progress Note Due on Visit 31    PT Start Time 1445    PT Stop Time 1530    PT Time Calculation (min) 45 min    Equipment Utilized During Treatment Gait belt    Activity Tolerance Patient tolerated treatment well    Behavior During Therapy WFL for tasks assessed/performed                          Past Medical History:  Diagnosis Date   Ankylosing spondylitis (HCC) dx'd ~ 1974   Arthritis    "back" (08/10/2015)   BENIGN PROSTATIC HYPERTROPHY, WITH OBSTRUCTION 01/15/2010   Bleeding duodenal ulcer    Bleeding esophageal ulcer    Bleeding stomach ulcer    GERD 02/22/2008   History of blood transfusion 1988   "lost ~ 1/2 of my blood volume from multiple bleeding ulcers"   History of hiatal hernia    HYPERGLYCEMIA 11/18/2007   HYPERLIPIDEMIA 02/22/2008   HYPERTENSION, UNSPECIFIED 11/10/2009   Kidney stones    Osteoarthritis    c-spine   PEPTIC ULCER DISEASE 11/17/2007   Situational depression    "son died in MVA 21-Jun-2015"   TIA (transient ischemic attack)    "not that I know of in the past; they are trying to determine if I've had one today (08/10/2015)"   Past Surgical History:  Procedure Laterality Date   CATARACT EXTRACTION W/ INTRAOCULAR LENS  IMPLANT, BILATERAL Bilateral 2013   Patient Active Problem List   Diagnosis Date Noted   Ankylosing spondylitis of cervicothoracic region (HCC) 10/16/2016   TIA (transient ischemic attack) 08/10/2015   Carotid stenosis 08/04/2014    Hyperlipidemia 02/21/2012   BENIGN PROSTATIC HYPERTROPHY, WITH OBSTRUCTION 01/15/2010   Essential hypertension 11/10/2009   GERD 02/22/2008   NEPHROLITHIASIS, HX OF 11/17/2007    ONSET DATE: "several years"  REFERRING DIAG: G20.A2 (ICD-10-CM) - Parkinson's disease with fluctuating manifestations, unspecified whether dyskinesia present R26.81 (ICD-10-CM) - Unsteadiness on feet R41.841 (ICD-10-CM) - Cognitive communication deficit R27.8 (ICD-10-CM) - Other lack of coordination R26.2 (ICD-10-CM) - Difficulty in walking, not elsewhere classified  THERAPY DIAG:  Unsteadiness on feet  Muscle weakness (generalized)  Abnormal posture  Other abnormalities of gait and mobility  Parkinson's disease with fluctuating manifestations, unspecified whether dyskinesia present (HCC)  Difficulty in walking, not elsewhere classified  Rationale for Evaluation and Treatment: Rehabilitation  SUBJECTIVE:  SUBJECTIVE STATEMENT: No falls since last here  Pt accompanied by: significant other  PERTINENT HISTORY: PD, complex medical history of peptic ulcer disease with history of upper GI bleed, TIA, depression, degenerative disc disease in the neck, osteoarthritis, kidney stones, hyperlipidemia, hypertension, history of hiatal hernia, BPH, arthritis, and ankylosing spondylitis, and parkinsonism   PAIN:  Are you having pain? Having some discomfort in right elbow from recent fall  PRECAUTIONS: Fall  RED FLAGS: None   WEIGHT BEARING RESTRICTIONS: No  FALLS: Has patient fallen in last 6 months? Yes. Number of falls unknown, multiple  LIVING ENVIRONMENT: Lives with: lives with their spouse Lives in: House/apartment Stairs: Ramp to enter, stairs to loft and basement, ground floor set-up Has following equipment at  home: Dan Humphreys - 2 wheeled  PLOF: Needs assistance with ADLs, Needs assistance with homemaking, and Needs assistance with gait, supervision for ambulation at household distances  PATIENT GOALS: improve balance, walking, reduce falls  OBJECTIVE:    TODAY'S TREATMENT: 07/16/23 Activity Comments  STG/LTG performance/review See below  W/c walking Cues for kicking legs out to engage heel strike  balance -Forwards/backwards in // bars: BUE to no UE support CGA throughout -standing on foam EO 3x30 sec. Eyes closed trials: 6-10 sec before LOB -SLS w/ BUE support 3x10 sec -sidestepping                      PATIENT EDUCATION: Education details: Backwards direction balance practice:  BIGGER, WIDER, SLOWER steps with bringing hips forward with quick stop Person educated: Patient and Spouse Education method: Explanation, Demonstration, and Verbal cues Education comprehension: verbalized understanding and needs further education  Access Code: [email or web address] URL: [email or web address] Date: 05/08/2023 Prepared by: [facility] - Outpatient  Rehab - [facility]  Program Notes perform with wife's supervision  Exercises - Mini Squat with Counter Support  - 1 x daily - 5 x weekly - 2 sets - 10 reps - Staggered Stance Forward Backward Weight Shift with Counter Support  - 1 x daily - 5 x weekly - 2 sets - 10 reps - Standing Quarter Turn with Counter Support  - 1 x daily - 5 x weekly - 2 sets - 5 reps - Seated Long Arc Quad with Ankle Weight  - 1 x daily - 5 x weekly - 3 sets - 10 reps - Seated Hip Flexion March with Ankle Weights  - 1 x daily - 5 x weekly - 3 sets - 10 reps - Seated Active Hip Flexion  - 1 x daily - 7 x weekly - 1 sets - 10 reps - 5-10 sec hold        ------------------------------------------------- Objective measures below taken at initial evaluation:  DIAGNOSTIC FINDINGS: n/a for episode  COGNITION: Overall cognitive status: History of cognitive  impairments - at baseline   SENSATION: Neuropathy in bilat feet  COORDINATION: Difficulty with rapid alternating movements    MUSCLE TONE: NT   POSTURE: rounded shoulders, forward head, flexed trunk , and head down , kyphosis  LOWER EXTREMITY ROM:     Active  Right Eval Left Eval  Hip flexion    Hip extension    Hip abduction    Hip adduction    Hip internal rotation    Hip external rotation    Knee flexion 120 120  Knee extension 0 0  Ankle dorsiflexion 5 12  Ankle plantarflexion    Ankle inversion    Ankle eversion     (Blank rows = not  tested)  LOWER EXTREMITY MMT:    BLE strength grossly 3+/5  BED MOBILITY:  Mod A  TRANSFERS: Assistive device utilized: Walker - 2 wheeled  Sit to stand: CGA and Min A Stand to sit: SBA Chair to chair: SBA and CGA Floor: Mod A and Max A  RAMP:  Level of Assistance: CGA Assistive device utilized: Environmental consultant - 2 wheeled Ramp Comments:   CURB:  Level of Assistance: CGA Assistive device utilized: Environmental consultant - 2 wheeled Curb Comments:   STAIRS: NT  GAIT: Gait pattern: step to pattern, decreased stride length, and poor foot clearance- Right Distance walked: 100 Assistive device utilized: Walker - 2 wheeled Level of assistance: SBA and CGA Comments: level surfaces  FUNCTIONAL TESTS:  5 times sit to stand: 45 sec w/ CGA Timed up and go (TUG): 1 min 27 sec  M-CTSIB  Condition 1: Firm Surface, EO 10 Sec, Severe and retro-LOB  Sway  Condition 2: Firm Surface, EC 3 Sec, Severe and retro-LOB  Sway  Condition 3: Foam Surface, EO  Sec,  Sway  Condition 4: Foam Surface, EC  Sec,  Sway       GOALS: Goals reviewed with patient? Yes  SHORT TERM GOALS: UPDATED Target date: 05/15/2023>05/23/2023    Patient will be independent in HEP to improve functional outcomes Baseline: has been reviewed with PT and wife previously 06/04/23 Goal status: MET 06/04/23  2.  Teach-back strategies to reduce freezing of gait during  turns Baseline: 16 steps to complete 180 deg turn; unable to recall 06/04/23 Goal status: NOT MET 06/04/23  3.  Demo improved safety and independence with sit to stand completing transfers with set-up assist Baseline: CGA-min A; unchanged 06/04/23 Goal status: NOT MET 06/04/23    LONG TERM GOALS: UPDATED Target date: 06/05/2023>06/20/2023> 07/17/2023> 08/27/2023      Demo reduced risk for falls per time 35 sec TUG test Baseline: 1 min 27 sec; 1 min 15 sec 06/04/23; 54 sec 06/19/23; 38 sec (07/16/23) Goal status: IN PROGRESS   2.  Demo improved BLE strength and balance per time of 25 sec 5xSTS test to increase independence with mobility Baseline: 45 sec; 1 min 33 sec with occasional min A and consistent verbal cues 06/04/23; 1 min 18 sec 06/19/23; (07/16/23) 41 sec w/ arm rest push-off Goal status: IN PROGRESS   3.  Demo improved safety with ambulation on level surfaces w/ supervision x 150 ft Baseline: SBA-CGA; occasional min A for balance and cueing to reduce freezing for 160 ft 06/04/23; 150 ft w/ supervision level surfaces x 2 min 45 sec Goal status: MET  4.  Improve gait speed to 1.5 ft/sec to improve efficiency of ambulation  Baseline: 0.9 ft/sec; 1.5 ft/sec (07/16/23)  Goal status: MET  ASSESSMENT:  CLINICAL IMPRESSION: Progress note/re-assessment performed at outset with check to LTG demonstrating marked improvements in 5xSTS test able to complete 1st repetition without UE push-off but requiring UE push-off for remaining with instances of retro-LOB but able to independently recover postural control.  TUG test with great improvement since last report achieving time of 38 sec vs previous 1.25 min w/ RW.  Continues to require 8-10 steps to manage 180 degree turn but improved postural stability as decrease in freezing/festinating noted.  Marked improvement in gait speed with ability to sustain average of 1.5 ft/sec when walking on a straightway but difficulty sustaining pace when stepping  around obstacles or making turns.  Continued with activities to facilitate postural control and facilitate righting reactions to reduce  risk for falls with improved ability to manage compliant surface and eyes closed 6-10 sec which is improved from outset unable to stand on firm ground eyes closed without LOB.  Patient would benefit from continued sessions to advance POC details and provid ongoing caregiver training/adaptations to improve safety with mobility in the home to reduce risk for falls.   OBJECTIVE IMPAIRMENTS: Abnormal gait, decreased activity tolerance, decreased balance, decreased coordination, decreased mobility, difficulty walking, decreased ROM, decreased strength, improper body mechanics, and postural dysfunction.   ACTIVITY LIMITATIONS: carrying, lifting, standing, stairs, transfers, bed mobility, and locomotion level  PARTICIPATION LIMITATIONS: meal prep, cleaning, laundry, interpersonal relationship, community activity, and exercise routine  PERSONAL FACTORS: Age, Time since onset of injury/illness/exacerbation, and 3+ comorbidities: PMH  are also affecting patient's functional outcome.   REHAB POTENTIAL: Good  CLINICAL DECISION MAKING: Evolving/moderate complexity  EVALUATION COMPLEXITY: Moderate  PLAN:  PT FREQUENCY: 1x/week  PT DURATION: 6 weeks  PLANNED INTERVENTIONS: Therapeutic exercises, Therapeutic activity, Neuromuscular re-education, Balance training, Gait training, Patient/Family education, Self Care, and Joint mobilization  PLAN FOR NEXT SESSION: large amplitude pivot-turning activities   3:13 PM, 07/16/23 M. Shary Decamp, PT, DPT Physical Therapist- [place] [facility] Office Number: [phone]

## 2023-07-29 DIAGNOSIS — B351 Tinea unguium: Secondary | ICD-10-CM | POA: Diagnosis not present

## 2023-07-29 DIAGNOSIS — M79676 Pain in unspecified toe(s): Secondary | ICD-10-CM | POA: Diagnosis not present

## 2023-07-30 ENCOUNTER — Ambulatory Visit: Payer: Medicare Other | Admitting: Occupational Therapy

## 2023-07-30 ENCOUNTER — Ambulatory Visit: Payer: Medicare Other | Admitting: Physical Therapy

## 2023-07-30 DIAGNOSIS — R3915 Urgency of urination: Secondary | ICD-10-CM | POA: Diagnosis not present

## 2023-07-30 DIAGNOSIS — M459 Ankylosing spondylitis of unspecified sites in spine: Secondary | ICD-10-CM | POA: Diagnosis not present

## 2023-07-30 DIAGNOSIS — R296 Repeated falls: Secondary | ICD-10-CM | POA: Diagnosis not present

## 2023-07-30 DIAGNOSIS — Z515 Encounter for palliative care: Secondary | ICD-10-CM | POA: Diagnosis not present

## 2023-07-30 DIAGNOSIS — G20B2 Parkinson's disease with dyskinesia, with fluctuations: Secondary | ICD-10-CM | POA: Diagnosis not present

## 2023-07-30 DIAGNOSIS — R2681 Unsteadiness on feet: Secondary | ICD-10-CM | POA: Diagnosis not present

## 2023-07-31 ENCOUNTER — Ambulatory Visit: Payer: Medicare Other

## 2023-07-31 ENCOUNTER — Ambulatory Visit: Payer: Medicare Other | Attending: Neurology | Admitting: Occupational Therapy

## 2023-07-31 DIAGNOSIS — R262 Difficulty in walking, not elsewhere classified: Secondary | ICD-10-CM | POA: Diagnosis not present

## 2023-07-31 DIAGNOSIS — R29818 Other symptoms and signs involving the nervous system: Secondary | ICD-10-CM

## 2023-07-31 DIAGNOSIS — R293 Abnormal posture: Secondary | ICD-10-CM

## 2023-07-31 DIAGNOSIS — G20A2 Parkinson's disease without dyskinesia, with fluctuations: Secondary | ICD-10-CM | POA: Insufficient documentation

## 2023-07-31 DIAGNOSIS — R278 Other lack of coordination: Secondary | ICD-10-CM | POA: Insufficient documentation

## 2023-07-31 DIAGNOSIS — M6281 Muscle weakness (generalized): Secondary | ICD-10-CM

## 2023-07-31 DIAGNOSIS — R2681 Unsteadiness on feet: Secondary | ICD-10-CM | POA: Diagnosis not present

## 2023-07-31 DIAGNOSIS — R2689 Other abnormalities of gait and mobility: Secondary | ICD-10-CM | POA: Diagnosis not present

## 2023-07-31 NOTE — Therapy (Signed)
OUTPATIENT OCCUPATIONAL THERAPY P[facility]IN[facility]S  Treatment Note & Progress Note  Patient Name: [name] MRN: [id number] DOB:12-20-1935, 87 y.o., male Today's Date: 07/31/2023  PCP: Garlan Fillers, MD REFERRING PROVIDER: Huston Foley, MD  Occupational Therapy Progress Note  Dates of Reporting Period: 06/16/23 to 07/31/23  Objective Reports of Subjective Statement: Pt continues to have an increase in falls, particularly when backing up or turning.  Pt and spouse expressing desire to keep working with therapies to focus on large amplitude exercises and modifications to routines and home setup to increase safety in home and decrease frequency of falls.  Pt and spouse are still in process with obtaining additional support in home via resources in [facility].     END OF SESSION:  OT End of Session - 07/31/23 1234     Visit Number 20    Number of Visits 25    Date for OT Re-Evaluation 08/29/23    Authorization Type Medicare A &B    OT Start Time 1104   pt in bathroom at onset of session   OT Stop Time 1145    OT Time Calculation (min) 41 min    Equipment Utilized During Treatment gait belt    Activity Tolerance Patient tolerated treatment well    Behavior During Therapy WFL for tasks assessed/performed;Flat affect                               Past Medical History:  Diagnosis Date   Ankylosing spondylitis (HCC) dx'd ~ 1974   Arthritis    "back" (08/10/2015)   BENIGN PROSTATIC HYPERTROPHY, WITH OBSTRUCTION 01/15/2010   Bleeding duodenal ulcer    Bleeding esophageal ulcer    Bleeding stomach ulcer    GERD 02/22/2008   History of blood transfusion 1988   "lost ~ 1/2 of my blood volume from multiple bleeding ulcers"   History of hiatal hernia    HYPERGLYCEMIA 11/18/2007   HYPERLIPIDEMIA 02/22/2008   HYPERTENSION, UNSPECIFIED 11/10/2009   Kidney stones    Osteoarthritis    c-spine   PEPTIC ULCER DISEASE 11/17/2007   Situational depression    "son  died in MVA 2015-06-21"   TIA (transient ischemic attack)    "not that I know of in the past; they are trying to determine if I've had one today (08/10/2015)"   Past Surgical History:  Procedure Laterality Date   CATARACT EXTRACTION W/ INTRAOCULAR LENS  IMPLANT, BILATERAL Bilateral 2013   Patient Active Problem List   Diagnosis Date Noted   Ankylosing spondylitis of cervicothoracic region (HCC) 10/16/2016   TIA (transient ischemic attack) 08/10/2015   Carotid stenosis 08/04/2014   Hyperlipidemia 02/21/2012   BENIGN PROSTATIC HYPERTROPHY, WITH OBSTRUCTION 01/15/2010   Essential hypertension 11/10/2009   GERD 02/22/2008   NEPHROLITHIASIS, HX OF 11/17/2007    ONSET DATE: referral date 03/25/23  REFERRING DIAG: G20.A2 (ICD-10-CM) - Parkinson's disease without dyskinesia, with fluctuations R26.81 (ICD-10-CM) - Unsteadiness on feet R41.841 (ICD-10-CM) - Cognitive communication deficit R27.8 (ICD-10-CM) - Other lack of coordination R26.2 (ICD-10-CM) - Difficulty in walking, not elsewhere classified  THERAPY DIAG:  Other symptoms and signs involving the nervous system  Other abnormalities of gait and mobility  Abnormal posture  Muscle weakness (generalized)  Unsteadiness on feet  Rationale for Evaluation and Treatment: Rehabilitation  SUBJECTIVE:   SUBJECTIVE STATEMENT: Pt reports "things have been up and down".  Pt reports falling "more than I want to be".  Pt's spouse  reports most falls happening when he is turning or when backing up.    Pt accompanied by: self (spouse, [name])  PERTINENT HISTORY: history of Parkinson's and ankylosing spondylitis   PRECAUTIONS: Fall  WEIGHT BEARING RESTRICTIONS: No  PAIN:  Are you having pain? No  FALLS: Has patient fallen in last 6 months? Yes. Number of falls has had multiple falls over the last few months with multiple in early July, and then 2 falls this month.  07/07/23 - Pt's spouse reports that he has not had a fall since Weds.  07/09/23 - Pt reports fall between midnight and 3 AM last night when returning from using the bathroom.  LIVING ENVIRONMENT: Lives with: lives with their spouse Lives in: House/apartment Stairs: Yes: External: 2-3 steps; has a ramp at entrance with raised railings Internal: steps to basement and steps to loft - however pt does not need to go up/down those at this time. Has following equipment at home: Dan Humphreys - 2 wheeled, Environmental consultant - 4 wheeled, bed side commode, Shower chair, Ramped entry, and transport chair  PLOF: Requires assistive device for independence and Needs assistance with ADLs  PATIENT GOALS: to be able to move without concern/serious concern  OBJECTIVE:   HAND DOMINANCE: Right  ADLs: Overall ADLs: Pt reports there are times that he can be "fairly independent" with dressing but there are times where spouse has to provide significant assistance with. Transfers/ambulation related to ADLs: Utilizing RW and will occasionally walk bouts through the house without RW.  If pt loses balance backwards he is unable to correct. Eating: "fumble fingered" UB Dressing: occasional assistance LB Dressing: socks and shoes are quite difficult Toileting: "I go to the bathroom by myself" but has difficulty getting up/down from toilet Bathing: washing at the sink, spouse will assist  Tub Shower transfers: pt feels uncomfortable in the shower with shower chair, but has not fallen Equipment: Shower seat with back and Walk in shower with 5.5" ledge with 30" door  IADLs: Light housekeeping: washes dishes when he is feeling strong, however frequent onset of weakness Meal Prep: Pt was primary cook prior to fall in 2023, but has returned to participation in aspects of meal prep Community mobility: recently renewed drivers license, but is not driving  MOBILITY STATUS: Needs Assist: CGA with mobility with RW and Hx of falls   POSTURE COMMENTS:  rounded shoulders and forward head, posterior pelvic tilt  and R lean  FUNCTIONAL OUTCOME MEASURES: Fastening/unfastening 3 buttons: unable to button any buttons in 1 mins Physical performance test: PPT#2 (simulated eating) 22.79 sec & PPT#4 (donning/doffing jacket): TBD  COORDINATION: Box and Blocks:  Right 30 blocks, Left 32 blocks  06/09/23: R: 39 blocks, L: 38 blocks  UE ROM:  all movements are slowed, decreased shoulder flexion d/t ankylosing spondylitis   UE MMT:    grossly 4/5 overall  COGNITION: Overall cognitive status:  Spouse reports thinking continues to be slower  OBSERVATIONS: Bradykinesia   TODAY'S TREATMENT:                                                                         DATE:  07/31/23 Transfers: engaged in massed practice with sit > stand and stand pivot transfers with focus on  assessing pattern with falls.  Pt demonstrating small, shuffling steps when turning 90* to sit in chair, more prevalent when turning to L than R.  Pt with initial instance of getting L foot caught under R foot, causing pt to freeze and lose balance backwards requiring mod assist to lower pt to chair.  Pt benefiting from cues for large steps and challenge to make turn in 4 steps to challenge amplitude and sequencing.  Pt demonstrating improvements in sequencing and stepping pattern, however still requiring ~8 steps each time.  Pt demonstrating decreased catching of LLE under self causing pt to lose balance backwards.  Orthostatics (assess due to reports of lightheadedness intermittently when moving)  BP seated: 125/67 HR 81 Standing: 117/65 HR 88 Standing after 2 mins: 123/63 HR 89    07/16/23 ADL: reiterated large amplitude, intentional movements with dressing and aspects of self-care tasks. OT providing demonstration with aspects of bathing and dressing.  Provided with handout - see pt instructions. Self-care: spouse reports that they have an upcoming in person meeting with Palliative care to get in-home care/physical assist. Large  amplitude: engaged in modified LSVT big exercises to carry over to decreased burden of care with transitional movements and dressing. Pt demonstrating improved forward and downward reach when given targets to reach towards. OT providing verbal cues for upright posture when reaching up and out to side.  OT providing encouragement and recommendations to increase upright posture during exercises at home. Pt demonstrating increased amplitude with repetition.    07/14/23 Self-care: Engaged in discussion with pt and spouse, reporting NP from Palliative care has been in contact with PCP to get pt started on meds to decrease bladder urgency/frequency to allow for increased safety and sleep at night.  Plans to f/u with NP in December for further intervention.   Toileting needs: discussed use of urinal at bedside to decrease frequency of ambulation to/from bathroom.  Pt and spouse fearful of use of BSC next to bed due to risk of falling backwards with stand>sit - preferring to keep it over the toilet. OT strongly recommending use or urinal to offset frequent trips to the toilet - pt willing to attempt. LB dressing: reiterated recommendations from previous therapy sessions in regards to establishing a routine with dressing; utilizing chair with back/arm rests, step stool to increase reach towards feet, and use of elastic shoe laces to reduce need to tie shoes.   Transfers: engaged in sit > stand and stand pivot transfers to focus on large steps when turning.  Pt demonstrating improvements in step length when walking in from waiting room (pt with new RW), however with increased shuffling when turning to sit on mat.  OT providing cues for large steps when turning 90* to sit in chair with pt demonstrating improvements when turning to R, more so than when turning to L.     PATIENT EDUCATION: Education details: see treatment note above Person educated: Patient and Spouse Education method: Explanation and  Handouts Education comprehension: verbalized understanding and needs further education  HOME EXERCISE PROGRAM: 06/25/23 - Handouts: V/c for sit-to-stand (see 06/25/23 pt instructions), additional copy of 05/14/23 "Bag Exercises" (see 05/14/23 pt instructions)  LSVT big exercises  Access Code: [email or web address] URL: [email or web address] Date: 06/23/2023 Prepared by: [facility] - Outpatient  Rehab - [facility]  Exercises - Seated Figure 4 Piriformis Stretch  - 1 x daily - 10 reps - 15 sec hold - Seated Piriformis Stretch  - 1 x daily - 10 reps - 15 sec  hold - Seated Single Knee to Chest  - 1 x daily - 10 reps - 5 sec hold  07/09/23 - Provided examples of no-tie lace options for LB dressing. (Handout, see pt instructions).   GOALS: Goals reviewed with patient? Yes  SHORT TERM GOALS: Target date: 05/23/23  Pt will be independent in full body HEP with focus on large amplitude movements as needed to increase independence with ADLs.  Baseline: Goal status: Not met - 05/26/23  2.  Pt will perform dynamic standing task for 5 mins as needed for simple IADLs w/o LOB using DME and/or countertop support prn  Baseline:  Goal status: IN PROGRESS  3.  Pt will demonstrate improved ease with sit > stand to get up from toilet or other seating options in home by improving score on sit > stand to decreased fall risk.  Baseline: time TBD Goal status: IN PROGRESS  4.  Pt will demonstrate improved sitting balance and trunk control to reach towards floor to retreive items to allow progression towards LB dressing. Baseline:  Goal status: MET - 05/28/23   NEW LONG TERM GOALS: Target date: 08/29/23  Pt will report ability to complete UB/LB dressing at Supervision/setup level with use of AE and/or alternative strategies PRN.  Baseline: Min A Goal status: REVISED  2.  Pt and spouse will report understanding of adaptive techniques/strategies and/or modifications to routines to increase safety  with mobility and self-care tasks in the home.  Baseline:  Goal status: IN PROGRESS  3.  Pt will demonstrate increased ease with dressing as evidenced by decreasing PPT#4(don/ doff jacket) by 10 secs or more.  Baseline: 1:05 Goal status: IN PROGRESS  4.  Pt and spouse will be independent in updated PD specific HEP to facilitate increased ROM and amplitude as needed for ADLs and quality of life.  Baseline:  Goal status: IN PROGRESS  ASSESSMENT:  CLINICAL IMPRESSION: Pt benefiting from cues for large stepping pattern when challenged to transfer to chair in 4 steps.  Pt benefiting from cues prior to each transition with cues for hand placement prior to standing from surface without arm rests and cues for recall of large steps.  PERFORMANCE DEFICITS: in functional skills including ADLs, IADLs, coordination, dexterity, ROM, strength, pain, flexibility, Fine motor control, Gross motor control, balance, body mechanics, endurance, decreased knowledge of precautions, decreased knowledge of use of DME, and UE functional use and psychosocial skills including coping strategies, environmental adaptation, and routines and behaviors.   IMPAIRMENTS: are limiting patient from ADLs and IADLs.    PLAN:  OT FREQUENCY: 1x/week  OT DURATION: 6 weeks  PLANNED INTERVENTIONS: self care/ADL training, therapeutic exercise, therapeutic activity, neuromuscular re-education, balance training, functional mobility training, ultrasound, compression bandaging, moist heat, cryotherapy, patient/family education, cognitive remediation/compensation, psychosocial skills training, energy conservation, coping strategies training, and DME and/or AE instructions  RECOMMENDED OTHER SERVICES: NA  CONSULTED AND AGREED WITH PLAN OF CARE: Patient  PLAN FOR NEXT SESSION:   Continue to review bag exercises for carryover to dressing Large amplitude and LSVT big exercises Sit > stand Education: Safety, reducing fall risk UB and  LB dressing adaptive strategies    Rosalio Loud, OTR/L 07/31/2023, 12:35 PM   [facility] at [facility] [address] [place], [place] [address] Phone # [phone] Fax # [phone]

## 2023-07-31 NOTE — Therapy (Signed)
OUTPATIENT PHYSICAL THERAPY NEURO TREATMENT NOTE  Patient Name: [name] MRN: [id number] DOB:[birth year], 87 y.o., male Today's Date: 07/31/2023   PCP: Garlan Fillers REFERRING PROVIDER: Huston Foley, MD     END OF SESSION:  PT End of Session - 07/31/23 1138     Visit Number 22    Number of Visits 27    Date for PT Re-Evaluation 08/27/23    Authorization Type Medicare/[employer]ARP-NEEDS KX due to previous PT and speech this year    Progress Note Due on Visit 31    PT Start Time 1145    PT Stop Time 1230    PT Time Calculation (min) 45 min    Equipment Utilized During Treatment Gait belt    Activity Tolerance Patient tolerated treatment well    Behavior During Therapy WFL for tasks assessed/performed                          Past Medical History:  Diagnosis Date   Ankylosing spondylitis (HCC) dx'd ~ 1974   Arthritis    "back" (08/10/2015)   BENIGN PROSTATIC HYPERTROPHY, WITH OBSTRUCTION 01/15/2010   Bleeding duodenal ulcer    Bleeding esophageal ulcer    Bleeding stomach ulcer    GERD 02/22/2008   History of blood transfusion 1988   "lost ~ 1/2 of my blood volume from multiple bleeding ulcers"   History of hiatal hernia    HYPERGLYCEMIA 11/18/2007   HYPERLIPIDEMIA 02/22/2008   HYPERTENSION, UNSPECIFIED 11/10/2009   Kidney stones    Osteoarthritis    c-spine   PEPTIC ULCER DISEASE 11/17/2007   Situational depression    "son died in MVA 06-21-15"   TIA (transient ischemic attack)    "not that I know of in the past; they are trying to determine if I've had one today (08/10/2015)"   Past Surgical History:  Procedure Laterality Date   CATARACT EXTRACTION W/ INTRAOCULAR LENS  IMPLANT, BILATERAL Bilateral 2013   Patient Active Problem List   Diagnosis Date Noted   Ankylosing spondylitis of cervicothoracic region (HCC) 10/16/2016   TIA (transient ischemic attack) 08/10/2015   Carotid stenosis 08/04/2014   Hyperlipidemia 02/21/2012   BENIGN PROSTATIC  HYPERTROPHY, WITH OBSTRUCTION 01/15/2010   Essential hypertension 11/10/2009   GERD 02/22/2008   NEPHROLITHIASIS, HX OF 11/17/2007    ONSET DATE: "several years"  REFERRING DIAG: G20.A2 (ICD-10-CM) - Parkinson's disease with fluctuating manifestations, unspecified whether dyskinesia present R26.81 (ICD-10-CM) - Unsteadiness on feet R41.841 (ICD-10-CM) - Cognitive communication deficit R27.8 (ICD-10-CM) - Other lack of coordination R26.2 (ICD-10-CM) - Difficulty in walking, not elsewhere classified  THERAPY DIAG:  Unsteadiness on feet  Abnormal posture  Muscle weakness (generalized)  Other symptoms and signs involving the nervous system  Parkinson's disease with fluctuating manifestations, unspecified whether dyskinesia present (HCC)  Difficulty in walking, not elsewhere classified  Rationale for Evaluation and Treatment: Rehabilitation  SUBJECTIVE:  SUBJECTIVE STATEMENT: Been falling more  Pt accompanied by: significant other  PERTINENT HISTORY: PD, complex medical history of peptic ulcer disease with history of upper GI bleed, TIA, depression, degenerative disc disease in the neck, osteoarthritis, kidney stones, hyperlipidemia, hypertension, history of hiatal hernia, BPH, arthritis, and ankylosing spondylitis, and parkinsonism   PAIN:  Are you having pain? Having some discomfort in right elbow from recent fall  PRECAUTIONS: Fall  RED FLAGS: None   WEIGHT BEARING RESTRICTIONS: No  FALLS: Has patient fallen in last 6 months? Yes. Number of falls unknown, multiple  LIVING ENVIRONMENT: Lives with: lives with their spouse Lives in: House/apartment Stairs: Ramp to enter, stairs to loft and basement, ground floor set-up Has following equipment at home: Dan Humphreys - 2 wheeled  PLOF: Needs  assistance with ADLs, Needs assistance with homemaking, and Needs assistance with gait, supervision for ambulation at household distances  PATIENT GOALS: improve balance, walking, reduce falls  OBJECTIVE:    TODAY'S TREATMENT: 07/31/23 Activity Comments  Pre-gait/gait training -Standing upper body cycling to promote rapid movement, reciprocal arm movement, and pelvic rotation to facilitate step/stride length x 5 min -gait training w/ RW and visual targets for step length/advancement with visual cues for negotiating turns -weight shift and step initiation to overcome freezing of gait  Balance/coordination -coordination and large amplitude movements performed in sitting and standing to promote postural stability and facilitate righting reactions                         PATIENT EDUCATION: Education details: Backwards direction balance practice:  BIGGER, WIDER, SLOWER steps with bringing hips forward with quick stop Person educated: Patient and Spouse Education method: Explanation, Demonstration, and Verbal cues Education comprehension: verbalized understanding and needs further education  Access Code: [email or web address] URL: [email or web address] Date: 05/08/2023 Prepared by: [facility] - Outpatient  Rehab - [facility]  Program Notes perform with wife's supervision  Exercises - Mini Squat with Counter Support  - 1 x daily - 5 x weekly - 2 sets - 10 reps - Staggered Stance Forward Backward Weight Shift with Counter Support  - 1 x daily - 5 x weekly - 2 sets - 10 reps - Standing Quarter Turn with Counter Support  - 1 x daily - 5 x weekly - 2 sets - 5 reps - Seated Long Arc Quad with Ankle Weight  - 1 x daily - 5 x weekly - 3 sets - 10 reps - Seated Hip Flexion March with Ankle Weights  - 1 x daily - 5 x weekly - 3 sets - 10 reps - Seated Active Hip Flexion  - 1 x daily - 7 x weekly - 1 sets - 10 reps - 5-10 sec  hold        ------------------------------------------------- Objective measures below taken at initial evaluation:  DIAGNOSTIC FINDINGS: n/a for episode  COGNITION: Overall cognitive status: History of cognitive impairments - at baseline   SENSATION: Neuropathy in bilat feet  COORDINATION: Difficulty with rapid alternating movements    MUSCLE TONE: NT   POSTURE: rounded shoulders, forward head, flexed trunk , and head down , kyphosis  LOWER EXTREMITY ROM:     Active  Right Eval Left Eval  Hip flexion    Hip extension    Hip abduction    Hip adduction    Hip internal rotation    Hip external rotation    Knee flexion 120 120  Knee extension 0 0  Ankle dorsiflexion 5 12  Ankle plantarflexion    Ankle inversion    Ankle eversion     (Blank rows = not tested)  LOWER EXTREMITY MMT:    BLE strength grossly 3+/5  BED MOBILITY:  Mod A  TRANSFERS: Assistive device utilized: Environmental consultant - 2 wheeled  Sit to stand: CGA and Min A Stand to sit: SBA Chair to chair: SBA and CGA Floor: Mod A and Max A  RAMP:  Level of Assistance: CGA Assistive device utilized: Environmental consultant - 2 wheeled Ramp Comments:   CURB:  Level of Assistance: CGA Assistive device utilized: Environmental consultant - 2 wheeled Curb Comments:   STAIRS: NT  GAIT: Gait pattern: step to pattern, decreased stride length, and poor foot clearance- Right Distance walked: 100 Assistive device utilized: Walker - 2 wheeled Level of assistance: SBA and CGA Comments: level surfaces  FUNCTIONAL TESTS:  5 times sit to stand: 45 sec w/ CGA Timed up and go (TUG): 1 min 27 sec  M-CTSIB  Condition 1: Firm Surface, EO 10 Sec, Severe and retro-LOB  Sway  Condition 2: Firm Surface, EC 3 Sec, Severe and retro-LOB  Sway  Condition 3: Foam Surface, EO  Sec,  Sway  Condition 4: Foam Surface, EC  Sec,  Sway       GOALS: Goals reviewed with patient? Yes  SHORT TERM GOALS: UPDATED Target date: 05/15/2023>05/23/2023     Patient will be independent in HEP to improve functional outcomes Baseline: has been reviewed with PT and wife previously 06/04/23 Goal status: MET 06/04/23  2.  Teach-back strategies to reduce freezing of gait during turns Baseline: 16 steps to complete 180 deg turn; unable to recall 06/04/23 Goal status: NOT MET 06/04/23  3.  Demo improved safety and independence with sit to stand completing transfers with set-up assist Baseline: CGA-min A; unchanged 06/04/23 Goal status: NOT MET 06/04/23    LONG TERM GOALS: UPDATED Target date: 06/05/2023>06/20/2023> 07/17/2023> 08/27/2023      Demo reduced risk for falls per time 35 sec TUG test Baseline: 1 min 27 sec; 1 min 15 sec 06/04/23; 54 sec 06/19/23; 38 sec (07/16/23) Goal status: IN PROGRESS   2.  Demo improved BLE strength and balance per time of 25 sec 5xSTS test to increase independence with mobility Baseline: 45 sec; 1 min 33 sec with occasional min A and consistent verbal cues 06/04/23; 1 min 18 sec 06/19/23; (07/16/23) 41 sec w/ arm rest push-off Goal status: IN PROGRESS   3.  Demo improved safety with ambulation on level surfaces w/ supervision x 150 ft Baseline: SBA-CGA; occasional min A for balance and cueing to reduce freezing for 160 ft 06/04/23; 150 ft w/ supervision level surfaces x 2 min 45 sec Goal status: MET  4.  Improve gait speed to 1.5 ft/sec to improve efficiency of ambulation  Baseline: 0.9 ft/sec; 1.5 ft/sec (07/16/23)  Goal status: MET  ASSESSMENT:  CLINICAL IMPRESSION: Initiated with activities to promote rapid alternating movements and constituent components of gait cycle (arm swing, pelvic/trunk rotation) for step length and carried strategies forward to gait training with visual targets to facilitate step length and for weight shifting to promote step initiation after freezing of gait episodes with moderate success with tendency for retro-LOB with freezing of gait episodes. Focus on large amplitude and gross  motor coordination activities to promote rapid alternating movements and to withstand postural perturbations to facilitate righting reactions.  Tolerated session well and would benefit from continued sessions to prevent functional decline related to progressive neurological disease.  Provided  caregiver with techniques and examples for step initiation and advancement, demonstrates teach-back.  OBJECTIVE IMPAIRMENTS: Abnormal gait, decreased activity tolerance, decreased balance, decreased coordination, decreased mobility, difficulty walking, decreased ROM, decreased strength, improper body mechanics, and postural dysfunction.   ACTIVITY LIMITATIONS: carrying, lifting, standing, stairs, transfers, bed mobility, and locomotion level  PARTICIPATION LIMITATIONS: meal prep, cleaning, laundry, interpersonal relationship, community activity, and exercise routine  PERSONAL FACTORS: Age, Time since onset of injury/illness/exacerbation, and 3+ comorbidities: PMH  are also affecting patient's functional outcome.   REHAB POTENTIAL: Good  CLINICAL DECISION MAKING: Evolving/moderate complexity  EVALUATION COMPLEXITY: Moderate  PLAN:  PT FREQUENCY: 1x/week  PT DURATION: 6 weeks  PLANNED INTERVENTIONS: Therapeutic exercises, Therapeutic activity, Neuromuscular re-education, Balance training, Gait training, Patient/Family education, Self Care, and Joint mobilization  PLAN FOR NEXT SESSION: large amplitude pivot-turning activities   11:38 AM, 07/31/23 M. Shary Decamp, PT, DPT Physical Therapist- [place] Office Number: [phone]

## 2023-08-05 ENCOUNTER — Encounter: Payer: Self-pay | Admitting: Physical Therapy

## 2023-08-05 ENCOUNTER — Ambulatory Visit: Payer: Medicare Other | Admitting: Occupational Therapy

## 2023-08-05 ENCOUNTER — Ambulatory Visit: Payer: Medicare Other | Admitting: Physical Therapy

## 2023-08-05 DIAGNOSIS — R29818 Other symptoms and signs involving the nervous system: Secondary | ICD-10-CM

## 2023-08-05 DIAGNOSIS — R2681 Unsteadiness on feet: Secondary | ICD-10-CM

## 2023-08-05 DIAGNOSIS — R293 Abnormal posture: Secondary | ICD-10-CM

## 2023-08-05 DIAGNOSIS — R278 Other lack of coordination: Secondary | ICD-10-CM

## 2023-08-05 DIAGNOSIS — M6281 Muscle weakness (generalized): Secondary | ICD-10-CM

## 2023-08-05 DIAGNOSIS — R2689 Other abnormalities of gait and mobility: Secondary | ICD-10-CM | POA: Diagnosis not present

## 2023-08-05 NOTE — Therapy (Signed)
OUTPATIENT PHYSICAL THERAPY NEURO TREATMENT NOTE  Patient Name: [name] MRN: [id number] DOB:08-Oct-1935, 87 y.o., male Today's Date: 08/05/2023   PCP: Garlan Fillers REFERRING PROVIDER: Huston Foley, MD     END OF SESSION:  PT End of Session - 08/05/23 1500     Visit Number 23    Number of Visits 27    Date for PT Re-Evaluation 08/27/23    Authorization Type Medicare/[employer]ARP-NEEDS KX due to previous PT and speech this year    Progress Note Due on Visit 31    PT Start Time 1411   from restroom   PT Stop Time 1452    PT Time Calculation (min) 41 min    Equipment Utilized During Treatment Gait belt    Activity Tolerance Patient tolerated treatment well    Behavior During Therapy WFL for tasks assessed/performed                          Past Medical History:  Diagnosis Date   Ankylosing spondylitis (HCC) dx'd ~ 1974   Arthritis    "back" (08/10/2015)   BENIGN PROSTATIC HYPERTROPHY, WITH OBSTRUCTION 01/15/2010   Bleeding duodenal ulcer    Bleeding esophageal ulcer    Bleeding stomach ulcer    GERD 02/22/2008   History of blood transfusion 1988   "lost ~ 1/2 of my blood volume from multiple bleeding ulcers"   History of hiatal hernia    HYPERGLYCEMIA 11/18/2007   HYPERLIPIDEMIA 02/22/2008   HYPERTENSION, UNSPECIFIED 11/10/2009   Kidney stones    Osteoarthritis    c-spine   PEPTIC ULCER DISEASE 11/17/2007   Situational depression    "son died in MVA 06/25/2015"   TIA (transient ischemic attack)    "not that I know of in the past; they are trying to determine if I've had one today (08/10/2015)"   Past Surgical History:  Procedure Laterality Date   CATARACT EXTRACTION W/ INTRAOCULAR LENS  IMPLANT, BILATERAL Bilateral 2013   Patient Active Problem List   Diagnosis Date Noted   Ankylosing spondylitis of cervicothoracic region (HCC) 10/16/2016   TIA (transient ischemic attack) 08/10/2015   Carotid stenosis 08/04/2014   Hyperlipidemia 02/21/2012    BENIGN PROSTATIC HYPERTROPHY, WITH OBSTRUCTION 01/15/2010   Essential hypertension 11/10/2009   GERD 02/22/2008   NEPHROLITHIASIS, HX OF 11/17/2007    ONSET DATE: "several years"  REFERRING DIAG: G20.A2 (ICD-10-CM) - Parkinson's disease with fluctuating manifestations, unspecified whether dyskinesia present R26.81 (ICD-10-CM) - Unsteadiness on feet R41.841 (ICD-10-CM) - Cognitive communication deficit R27.8 (ICD-10-CM) - Other lack of coordination R26.2 (ICD-10-CM) - Difficulty in walking, not elsewhere classified  THERAPY DIAG:  Unsteadiness on feet  Muscle weakness (generalized)  Other symptoms and signs involving the nervous system  Rationale for Evaluation and Treatment: Rehabilitation  SUBJECTIVE:  SUBJECTIVE STATEMENT: Per wife-over the Thanksgiving holiday, he fell almost everyday.  It's either turning or going backwards.  Would like to work on that.  Pt accompanied by: significant other  PERTINENT HISTORY: PD, complex medical history of peptic ulcer disease with history of upper GI bleed, TIA, depression, degenerative disc disease in the neck, osteoarthritis, kidney stones, hyperlipidemia, hypertension, history of hiatal hernia, BPH, arthritis, and ankylosing spondylitis, and parkinsonism   PAIN:  Are you having pain? Having some discomfort in right elbow from recent fall  PRECAUTIONS: Fall  RED FLAGS: None   WEIGHT BEARING RESTRICTIONS: No  FALLS: Has patient fallen in last 6 months? Yes. Number of falls unknown, multiple  LIVING ENVIRONMENT: Lives with: lives with their spouse Lives in: House/apartment Stairs: Ramp to enter, stairs to loft and basement, ground floor set-up Has following equipment at home: Dan Humphreys - 2 wheeled  PLOF: Needs assistance with ADLs, Needs assistance  with homemaking, and Needs assistance with gait, supervision for ambulation at household distances  PATIENT GOALS: improve balance, walking, reduce falls  OBJECTIVE:    TODAY'S TREATMENT: 08/05/2023 Activity Comments  Pt in restroom and PT works with wife to problem solve some issues with turning, walking backwards Discussed option for visual cues/targets on floor for reminders for more stagger stance foot placement to lessen retropulsion tendency  PT assists pt with gait from restroom into gym with RW Pt starts with small step length, shuffling, narrow BOS; able to improve with cues for "take as few steps as possible"  Turn to sit at chair, narrow BOS, min/mod assist with pt having strong posterior lean PT provides cues to reset posture, widen BOS and "lead with feet backwards, not trunk"-still has heavy posterior lean  Sit to stand from chair, then short distance gait with RW and turn Worked on initial stagger stance foot placement with R foot step to target  Performed gait short distance with turns with RW Narrowed wheel position, then changed to wider wheel position  Gait with RW with negotiating around obstacles Needs assist for RW steering               PATIENT EDUCATION: Education details: Visual cues/target on floor (like sticky notes or painter's tape) in typical retropulsion areas of home, to facilitate ease of getting into stagger stance position for better balance and less posterior lean; placed wheels wider position vs narrow position to allow for wider BOS and less tipping of RW Person educated: Patient and Spouse Education method: Explanation, Demonstration, and Verbal cues Education comprehension: verbalized understanding and needs further education   Access Code: [email or web address] URL: [email or web address] Date: 05/08/2023 Prepared by: [facility] - Outpatient  Rehab - [facility]  Program Notes perform with wife's supervision  Exercises - Mini Squat  with Counter Support  - 1 x daily - 5 x weekly - 2 sets - 10 reps - Staggered Stance Forward Backward Weight Shift with Counter Support  - 1 x daily - 5 x weekly - 2 sets - 10 reps - Standing Quarter Turn with Counter Support  - 1 x daily - 5 x weekly - 2 sets - 5 reps - Seated Long Arc Quad with Ankle Weight  - 1 x daily - 5 x weekly - 3 sets - 10 reps - Seated Hip Flexion March with Ankle Weights  - 1 x daily - 5 x weekly - 3 sets - 10 reps - Seated Active Hip Flexion  - 1 x daily - 7 x weekly - 1  sets - 10 reps - 5-10 sec hold        ------------------------------------------------- Objective measures below taken at initial evaluation:  DIAGNOSTIC FINDINGS: n/a for episode  COGNITION: Overall cognitive status: History of cognitive impairments - at baseline   SENSATION: Neuropathy in bilat feet  COORDINATION: Difficulty with rapid alternating movements    MUSCLE TONE: NT   POSTURE: rounded shoulders, forward head, flexed trunk , and head down , kyphosis  LOWER EXTREMITY ROM:     Active  Right Eval Left Eval  Hip flexion    Hip extension    Hip abduction    Hip adduction    Hip internal rotation    Hip external rotation    Knee flexion 120 120  Knee extension 0 0  Ankle dorsiflexion 5 12  Ankle plantarflexion    Ankle inversion    Ankle eversion     (Blank rows = not tested)  LOWER EXTREMITY MMT:    BLE strength grossly 3+/5  BED MOBILITY:  Mod A  TRANSFERS: Assistive device utilized: Environmental consultant - 2 wheeled  Sit to stand: CGA and Min A Stand to sit: SBA Chair to chair: SBA and CGA Floor: Mod A and Max A  RAMP:  Level of Assistance: CGA Assistive device utilized: Environmental consultant - 2 wheeled Ramp Comments:   CURB:  Level of Assistance: CGA Assistive device utilized: Environmental consultant - 2 wheeled Curb Comments:   STAIRS: NT  GAIT: Gait pattern: step to pattern, decreased stride length, and poor foot clearance- Right Distance walked: 100 Assistive device  utilized: Walker - 2 wheeled Level of assistance: SBA and CGA Comments: level surfaces  FUNCTIONAL TESTS:  5 times sit to stand: 45 sec w/ CGA Timed up and go (TUG): 1 min 27 sec  M-CTSIB  Condition 1: Firm Surface, EO 10 Sec, Severe and retro-LOB  Sway  Condition 2: Firm Surface, EC 3 Sec, Severe and retro-LOB  Sway  Condition 3: Foam Surface, EO  Sec,  Sway  Condition 4: Foam Surface, EC  Sec,  Sway       GOALS: Goals reviewed with patient? Yes  SHORT TERM GOALS: UPDATED Target date: 05/15/2023>05/23/2023    Patient will be independent in HEP to improve functional outcomes Baseline: has been reviewed with PT and wife previously 06/04/23 Goal status: MET 06/04/23  2.  Teach-back strategies to reduce freezing of gait during turns Baseline: 16 steps to complete 180 deg turn; unable to recall 06/04/23 Goal status: NOT MET 06/04/23  3.  Demo improved safety and independence with sit to stand completing transfers with set-up assist Baseline: CGA-min A; unchanged 06/04/23 Goal status: NOT MET 06/04/23    LONG TERM GOALS: UPDATED Target date: 06/05/2023>06/20/2023> 07/17/2023> 08/27/2023      Demo reduced risk for falls per time 35 sec TUG test Baseline: 1 min 27 sec; 1 min 15 sec 06/04/23; 54 sec 06/19/23; 38 sec (07/16/23) Goal status: IN PROGRESS   2.  Demo improved BLE strength and balance per time of 25 sec 5xSTS test to increase independence with mobility Baseline: 45 sec; 1 min 33 sec with occasional min A and consistent verbal cues 06/04/23; 1 min 18 sec 06/19/23; (07/16/23) 41 sec w/ arm rest push-off Goal status: IN PROGRESS   3.  Demo improved safety with ambulation on level surfaces w/ supervision x 150 ft Baseline: SBA-CGA; occasional min A for balance and cueing to reduce freezing for 160 ft 06/04/23; 150 ft w/ supervision level surfaces x 2 min 45 sec  Goal status: MET  4.  Improve gait speed to 1.5 ft/sec to improve efficiency of ambulation  Baseline: 0.9 ft/sec; 1.5  ft/sec (07/16/23)  Goal status: MET  ASSESSMENT:  CLINICAL IMPRESSION: Wife and pt report that they want to work on turns and less posterior loss of balance.  Skilled PT session focused on trial use of visual targets on ground (such as sticky note or painter's tape they could try at home) to facilitate a quicker response to wider/staggered stance to lessen retropulsion.  Practiced in session today with pt having requiring verbal cues to try this each time he stands; he does have less strong posterior tendency with this foot placement.  Wife present and educated in trying to place visual cues like this in their home environment.  Also widened wheels on his RW out of the narrow position, which seems to help his stability with turns; he will likely need more practice with this.  OBJECTIVE IMPAIRMENTS: Abnormal gait, decreased activity tolerance, decreased balance, decreased coordination, decreased mobility, difficulty walking, decreased ROM, decreased strength, improper body mechanics, and postural dysfunction.   ACTIVITY LIMITATIONS: carrying, lifting, standing, stairs, transfers, bed mobility, and locomotion level  PARTICIPATION LIMITATIONS: meal prep, cleaning, laundry, interpersonal relationship, community activity, and exercise routine  PERSONAL FACTORS: Age, Time since onset of injury/illness/exacerbation, and 3+ comorbidities: PMH  are also affecting patient's functional outcome.   REHAB POTENTIAL: Good  CLINICAL DECISION MAKING: Evolving/moderate complexity  EVALUATION COMPLEXITY: Moderate  PLAN:  PT FREQUENCY: 1x/week  PT DURATION: 6 weeks  PLANNED INTERVENTIONS: Therapeutic exercises, Therapeutic activity, Neuromuscular re-education, Balance training, Gait training, Patient/Family education, Self Care, and Joint mobilization  PLAN FOR NEXT SESSION: Continue large amplitude pivot-turning activities, ask how it went with use of environmental visual cues; how did it go with RW with  wider wheels?   Lonia Blood, PT 08/05/23 3:01 PM Phone: [phone] Fax: [phone]  [facility] at [facility] [address] [place], [address] [place], [place] [address] Phone # [phone] Fax # [phone]

## 2023-08-05 NOTE — Therapy (Signed)
OUTPATIENT OCCUPATIONAL THERAPY PARKINSON'S  Treatment Note  Patient Name: [name] MRN: [id number] DOB:October 25, 1935, 87 y.o., male Today's Date: 08/05/2023  PCP: Garlan Fillers, MD REFERRING PROVIDER: Huston Foley, MD     END OF SESSION:  OT End of Session - 08/05/23 1323     Visit Number 21    Number of Visits 25    Date for OT Re-Evaluation 08/29/23    Authorization Type Medicare A &B    OT Start Time 1320   pt in bathroom at beginning of session   OT Stop Time 1400    OT Time Calculation (min) 40 min    Equipment Utilized During Treatment gait belt    Activity Tolerance Patient tolerated treatment well    Behavior During Therapy WFL for tasks assessed/performed;Flat affect                               Past Medical History:  Diagnosis Date   Ankylosing spondylitis (HCC) dx'd ~ 1974   Arthritis    "back" (08/10/2015)   BENIGN PROSTATIC HYPERTROPHY, WITH OBSTRUCTION 01/15/2010   Bleeding duodenal ulcer    Bleeding esophageal ulcer    Bleeding stomach ulcer    GERD 02/22/2008   History of blood transfusion 1988   "lost ~ 1/2 of my blood volume from multiple bleeding ulcers"   History of hiatal hernia    HYPERGLYCEMIA 11/18/2007   HYPERLIPIDEMIA 02/22/2008   HYPERTENSION, UNSPECIFIED 11/10/2009   Kidney stones    Osteoarthritis    c-spine   PEPTIC ULCER DISEASE 11/17/2007   Situational depression    "son died in MVA 06/20/2015"   TIA (transient ischemic attack)    "not that I know of in the past; they are trying to determine if I've had one today (08/10/2015)"   Past Surgical History:  Procedure Laterality Date   CATARACT EXTRACTION W/ INTRAOCULAR LENS  IMPLANT, BILATERAL Bilateral 2013   Patient Active Problem List   Diagnosis Date Noted   Ankylosing spondylitis of cervicothoracic region (HCC) 10/16/2016   TIA (transient ischemic attack) 08/10/2015   Carotid stenosis 08/04/2014   Hyperlipidemia 02/21/2012   BENIGN PROSTATIC  HYPERTROPHY, WITH OBSTRUCTION 01/15/2010   Essential hypertension 11/10/2009   GERD 02/22/2008   NEPHROLITHIASIS, HX OF 11/17/2007    ONSET DATE: referral date 03/25/23  REFERRING DIAG: G20.A2 (ICD-10-CM) - Parkinson's disease without dyskinesia, with fluctuations R26.81 (ICD-10-CM) - Unsteadiness on feet R41.841 (ICD-10-CM) - Cognitive communication deficit R27.8 (ICD-10-CM) - Other lack of coordination R26.2 (ICD-10-CM) - Difficulty in walking, not elsewhere classified  THERAPY DIAG:  Unsteadiness on feet  Muscle weakness (generalized)  Other symptoms and signs involving the nervous system  Abnormal posture  Other lack of coordination  Rationale for Evaluation and Treatment: Rehabilitation  SUBJECTIVE:   SUBJECTIVE STATEMENT: Pt spouse reports increased difficulty with tying shoes.  Pt accompanied by: self (spouse, [name])  PERTINENT HISTORY: history of Parkinson's and ankylosing spondylitis   PRECAUTIONS: Fall  WEIGHT BEARING RESTRICTIONS: No  PAIN:  Are you having pain? No  FALLS: Has patient fallen in last 6 months? Yes. Number of falls has had multiple falls over the last few months with multiple in early July, and then 2 falls this month.  07/07/23 - Pt's spouse reports that he has not had a fall since Weds. 07/09/23 - Pt reports fall between midnight and 3 AM last night when returning from using the bathroom.  LIVING ENVIRONMENT: Lives with:  lives with their spouse Lives in: House/apartment Stairs: Yes: External: 2-3 steps; has a ramp at entrance with raised railings Internal: steps to basement and steps to loft - however pt does not need to go up/down those at this time. Has following equipment at home: Dan Humphreys - 2 wheeled, Environmental consultant - 4 wheeled, bed side commode, Shower chair, Ramped entry, and transport chair  PLOF: Requires assistive device for independence and Needs assistance with ADLs  PATIENT GOALS: to be able to move without concern/serious  concern  OBJECTIVE:   HAND DOMINANCE: Right  ADLs: Overall ADLs: Pt reports there are times that he can be "fairly independent" with dressing but there are times where spouse has to provide significant assistance with. Transfers/ambulation related to ADLs: Utilizing RW and will occasionally walk bouts through the house without RW.  If pt loses balance backwards he is unable to correct. Eating: "fumble fingered" UB Dressing: occasional assistance LB Dressing: socks and shoes are quite difficult Toileting: "I go to the bathroom by myself" but has difficulty getting up/down from toilet Bathing: washing at the sink, spouse will assist  Tub Shower transfers: pt feels uncomfortable in the shower with shower chair, but has not fallen Equipment: Shower seat with back and Walk in shower with 5.5" ledge with 30" door  IADLs: Light housekeeping: washes dishes when he is feeling strong, however frequent onset of weakness Meal Prep: Pt was primary cook prior to fall in 2023, but has returned to participation in aspects of meal prep Community mobility: recently renewed drivers license, but is not driving  MOBILITY STATUS: Needs Assist: CGA with mobility with RW and Hx of falls   POSTURE COMMENTS:  rounded shoulders and forward head, posterior pelvic tilt and R lean  FUNCTIONAL OUTCOME MEASURES: Fastening/unfastening 3 buttons: unable to button any buttons in 1 mins Physical performance test: PPT#2 (simulated eating) 22.79 sec & PPT#4 (donning/doffing jacket): TBD  COORDINATION: Box and Blocks:  Right 30 blocks, Left 32 blocks  06/09/23: R: 39 blocks, L: 38 blocks  UE ROM:  all movements are slowed, decreased shoulder flexion d/t ankylosing spondylitis   UE MMT:    grossly 4/5 overall  COGNITION: Overall cognitive status:  Spouse reports thinking continues to be slower  OBSERVATIONS: Bradykinesia   TODAY'S TREATMENT:                                                                          DATE:  08/05/23 Sleeping: Pt reports that his R shoulder is hurting him more when he is trying to sleep and when he wakes up in the mornings.  Engaged in discussion of positioning of body and arm when in sidelying on both sides.  Pt and spouse to further assess positioning in bed as well as welcome recommendations for ease of transitioning in/out of bed. Tying shoes: Pt demonstrating difficulty with forward reaching as well as sustained figure 4 position to allow for pt to fasten shoes.  Pt also with difficulty with tying shoelaces even when just placed in lap this session.  OT educated on use of elastic shoe laces for increased ease and independence with donning shoes.  OT demonstrated use with elastic laces on personal shoes and provided pt and spouse with  handout of various styles that may benefit pt best.     07/31/23 Transfers: engaged in massed practice with sit > stand and stand pivot transfers with focus on assessing pattern with falls.  Pt demonstrating small, shuffling steps when turning 90* to sit in chair, more prevalent when turning to L than R.  Pt with initial instance of getting L foot caught under R foot, causing pt to freeze and lose balance backwards requiring mod assist to lower pt to chair.  Pt benefiting from cues for large steps and challenge to make turn in 4 steps to challenge amplitude and sequencing.  Pt demonstrating improvements in sequencing and stepping pattern, however still requiring ~8 steps each time.  Pt demonstrating decreased catching of LLE under self causing pt to lose balance backwards.  Orthostatics (assess due to reports of lightheadedness intermittently when moving)  BP seated: 125/67 HR 81 Standing: 117/65 HR 88 Standing after 2 mins: 123/63 HR 89    07/16/23 ADL: reiterated large amplitude, intentional movements with dressing and aspects of self-care tasks. OT providing demonstration with aspects of bathing and dressing.  Provided with handout - see pt  instructions. Self-care: spouse reports that they have an upcoming in person meeting with Palliative care to get in-home care/physical assist. Large amplitude: engaged in modified LSVT big exercises to carry over to decreased burden of care with transitional movements and dressing. Pt demonstrating improved forward and downward reach when given targets to reach towards. OT providing verbal cues for upright posture when reaching up and out to side.  OT providing encouragement and recommendations to increase upright posture during exercises at home. Pt demonstrating increased amplitude with repetition.   PATIENT EDUCATION: Education details: see treatment note above Person educated: Patient and Spouse Education method: Explanation and Handouts Education comprehension: verbalized understanding and needs further education  HOME EXERCISE PROGRAM: 06/25/23 - Handouts: V/c for sit-to-stand (see 06/25/23 pt instructions), additional copy of 05/14/23 "Bag Exercises" (see 05/14/23 pt instructions)  LSVT big exercises  Access Code: [email or web address] URL: [email or web address] Date: 06/23/2023 Prepared by: [facility] - Outpatient  Rehab - [facility]  Exercises - Seated Figure 4 Piriformis Stretch  - 1 x daily - 10 reps - 15 sec hold - Seated Piriformis Stretch  - 1 x daily - 10 reps - 15 sec hold - Seated Single Knee to Chest  - 1 x daily - 10 reps - 5 sec hold  07/09/23 - Provided examples of no-tie lace options for LB dressing. (Handout, see pt instructions).   GOALS: Goals reviewed with patient? Yes  SHORT TERM GOALS: Target date: 05/23/23  Pt will be independent in full body HEP with focus on large amplitude movements as needed to increase independence with ADLs.  Baseline: Goal status: Not met - 05/26/23  2.  Pt will perform dynamic standing task for 5 mins as needed for simple IADLs w/o LOB using DME and/or countertop support prn  Baseline:  Goal status: IN PROGRESS  3.  Pt  will demonstrate improved ease with sit > stand to get up from toilet or other seating options in home by improving score on sit > stand to decreased fall risk.  Baseline: time TBD Goal status: IN PROGRESS  4.  Pt will demonstrate improved sitting balance and trunk control to reach towards floor to retreive items to allow progression towards LB dressing. Baseline:  Goal status: MET - 05/28/23   NEW LONG TERM GOALS: Target date: 08/29/23  Pt will report ability to complete UB/LB  dressing at Supervision/setup level with use of AE and/or alternative strategies PRN.  Baseline: Min A Goal status: REVISED  2.  Pt and spouse will report understanding of adaptive techniques/strategies and/or modifications to routines to increase safety with mobility and self-care tasks in the home.  Baseline:  Goal status: IN PROGRESS  3.  Pt will demonstrate increased ease with dressing as evidenced by decreasing PPT#4(don/ doff jacket) by 10 secs or more.  Baseline: 1:05 Goal status: IN PROGRESS  4.  Pt and spouse will be independent in updated PD specific HEP to facilitate increased ROM and amplitude as needed for ADLs and quality of life.  Baseline:  Goal status: IN PROGRESS  ASSESSMENT:  CLINICAL IMPRESSION: Pt ambulating much better this session with improved stepping pattern and length when ambulating towards therapy mat.  Pt appreciative of recommendations for elastic laces with and without bungie lock.  Pt and spouse to purchase best style and attempt at home.    PERFORMANCE DEFICITS: in functional skills including ADLs, IADLs, coordination, dexterity, ROM, strength, pain, flexibility, Fine motor control, Gross motor control, balance, body mechanics, endurance, decreased knowledge of precautions, decreased knowledge of use of DME, and UE functional use and psychosocial skills including coping strategies, environmental adaptation, and routines and behaviors.   IMPAIRMENTS: are limiting patient from  ADLs and IADLs.    PLAN:  OT FREQUENCY: 1x/week  OT DURATION: 6 weeks  PLANNED INTERVENTIONS: self care/ADL training, therapeutic exercise, therapeutic activity, neuromuscular re-education, balance training, functional mobility training, ultrasound, compression bandaging, moist heat, cryotherapy, patient/family education, cognitive remediation/compensation, psychosocial skills training, energy conservation, coping strategies training, and DME and/or AE instructions  RECOMMENDED OTHER SERVICES: NA  CONSULTED AND AGREED WITH PLAN OF CARE: Patient  PLAN FOR NEXT SESSION:   Continue to review bag exercises for carryover to dressing Large amplitude and LSVT big exercises Sit > stand Education: Safety, reducing fall risk UB and LB dressing adaptive strategies    Katara Griner, OTR/L 08/05/2023, 1:23 PM   [facility] at [facility] [address] [place], [place] [address] Phone # [phone] Fax # [phone]

## 2023-08-13 ENCOUNTER — Ambulatory Visit: Payer: Medicare Other

## 2023-08-13 ENCOUNTER — Ambulatory Visit: Payer: Medicare Other | Admitting: Occupational Therapy

## 2023-08-13 DIAGNOSIS — R2689 Other abnormalities of gait and mobility: Secondary | ICD-10-CM | POA: Diagnosis not present

## 2023-08-13 DIAGNOSIS — R262 Difficulty in walking, not elsewhere classified: Secondary | ICD-10-CM

## 2023-08-13 DIAGNOSIS — M6281 Muscle weakness (generalized): Secondary | ICD-10-CM

## 2023-08-13 DIAGNOSIS — R2681 Unsteadiness on feet: Secondary | ICD-10-CM | POA: Diagnosis not present

## 2023-08-13 DIAGNOSIS — R293 Abnormal posture: Secondary | ICD-10-CM | POA: Diagnosis not present

## 2023-08-13 DIAGNOSIS — R29818 Other symptoms and signs involving the nervous system: Secondary | ICD-10-CM

## 2023-08-13 DIAGNOSIS — R278 Other lack of coordination: Secondary | ICD-10-CM | POA: Diagnosis not present

## 2023-08-13 DIAGNOSIS — G20A2 Parkinson's disease without dyskinesia, with fluctuations: Secondary | ICD-10-CM

## 2023-08-13 NOTE — Therapy (Signed)
OUTPATIENT OCCUPATIONAL THERAPY PARKINSON'S  Treatment Note  Patient Name: [name] MRN: [id number] DOB:1935/09/28, 87 y.o., male Today's Date: 08/13/2023  PCP: Garlan Fillers, MD REFERRING PROVIDER: Huston Foley, MD     END OF SESSION:  OT End of Session - 08/13/23 1414     Visit Number 22    Number of Visits 25    Date for OT Re-Evaluation 08/29/23    Authorization Type Medicare A &B    OT Start Time 1405    OT Stop Time 1445    OT Time Calculation (min) 40 min    Equipment Utilized During Treatment gait belt    Activity Tolerance Patient tolerated treatment well    Behavior During Therapy WFL for tasks assessed/performed;Flat affect                                Past Medical History:  Diagnosis Date   Ankylosing spondylitis (HCC) dx'd ~ 1974   Arthritis    "back" (08/10/2015)   BENIGN PROSTATIC HYPERTROPHY, WITH OBSTRUCTION 01/15/2010   Bleeding duodenal ulcer    Bleeding esophageal ulcer    Bleeding stomach ulcer    GERD 02/22/2008   History of blood transfusion 1988   "lost ~ 1/2 of my blood volume from multiple bleeding ulcers"   History of hiatal hernia    HYPERGLYCEMIA 11/18/2007   HYPERLIPIDEMIA 02/22/2008   HYPERTENSION, UNSPECIFIED 11/10/2009   Kidney stones    Osteoarthritis    c-spine   PEPTIC ULCER DISEASE 11/17/2007   Situational depression    "son died in MVA 06-22-2015"   TIA (transient ischemic attack)    "not that I know of in the past; they are trying to determine if I've had one today (08/10/2015)"   Past Surgical History:  Procedure Laterality Date   CATARACT EXTRACTION W/ INTRAOCULAR LENS  IMPLANT, BILATERAL Bilateral 2013   Patient Active Problem List   Diagnosis Date Noted   Ankylosing spondylitis of cervicothoracic region (HCC) 10/16/2016   TIA (transient ischemic attack) 08/10/2015   Carotid stenosis 08/04/2014   Hyperlipidemia 02/21/2012   BENIGN PROSTATIC HYPERTROPHY, WITH OBSTRUCTION 01/15/2010    Essential hypertension 11/10/2009   GERD 02/22/2008   NEPHROLITHIASIS, HX OF 11/17/2007    ONSET DATE: referral date 03/25/23  REFERRING DIAG: G20.A2 (ICD-10-CM) - Parkinson's disease without dyskinesia, with fluctuations R26.81 (ICD-10-CM) - Unsteadiness on feet R41.841 (ICD-10-CM) - Cognitive communication deficit R27.8 (ICD-10-CM) - Other lack of coordination R26.2 (ICD-10-CM) - Difficulty in walking, not elsewhere classified  THERAPY DIAG:  Unsteadiness on feet  Muscle weakness (generalized)  Other symptoms and signs involving the nervous system  Other lack of coordination  Rationale for Evaluation and Treatment: Rehabilitation  SUBJECTIVE:   SUBJECTIVE STATEMENT: Pt reports falling just inside the doorway when walking to the bathroom, falling backwards over a chair and onto the floor.   Pt accompanied by: self (spouse, [name])  PERTINENT HISTORY: history of Parkinson's and ankylosing spondylitis   PRECAUTIONS: Fall  WEIGHT BEARING RESTRICTIONS: No  PAIN:  Are you having pain? No  FALLS: Has patient fallen in last 6 months? Yes. Number of falls has had multiple falls over the last few months with multiple in early July, and then 2 falls this month.  07/07/23 - Pt's spouse reports that he has not had a fall since Weds. 07/09/23 - Pt reports fall between midnight and 3 AM last night when returning from using the bathroom.  LIVING ENVIRONMENT: Lives with: lives with their spouse Lives in: House/apartment Stairs: Yes: External: 2-3 steps; has a ramp at entrance with raised railings Internal: steps to basement and steps to loft - however pt does not need to go up/down those at this time. Has following equipment at home: Dan Humphreys - 2 wheeled, Environmental consultant - 4 wheeled, bed side commode, Shower chair, Ramped entry, and transport chair  PLOF: Requires assistive device for independence and Needs assistance with ADLs  PATIENT GOALS: to be able to move without concern/serious  concern  OBJECTIVE:   HAND DOMINANCE: Right  ADLs: Overall ADLs: Pt reports there are times that he can be "fairly independent" with dressing but there are times where spouse has to provide significant assistance with. Transfers/ambulation related to ADLs: Utilizing RW and will occasionally walk bouts through the house without RW.  If pt loses balance backwards he is unable to correct. Eating: "fumble fingered" UB Dressing: occasional assistance LB Dressing: socks and shoes are quite difficult Toileting: "I go to the bathroom by myself" but has difficulty getting up/down from toilet Bathing: washing at the sink, spouse will assist  Tub Shower transfers: pt feels uncomfortable in the shower with shower chair, but has not fallen Equipment: Shower seat with back and Walk in shower with 5.5" ledge with 30" door  IADLs: Light housekeeping: washes dishes when he is feeling strong, however frequent onset of weakness Meal Prep: Pt was primary cook prior to fall in 2023, but has returned to participation in aspects of meal prep Community mobility: recently renewed drivers license, but is not driving  MOBILITY STATUS: Needs Assist: CGA with mobility with RW and Hx of falls   POSTURE COMMENTS:  rounded shoulders and forward head, posterior pelvic tilt and R lean  FUNCTIONAL OUTCOME MEASURES: Fastening/unfastening 3 buttons: unable to button any buttons in 1 mins Physical performance test: PPT#2 (simulated eating) 22.79 sec & PPT#4 (donning/doffing jacket): TBD  COORDINATION: Box and Blocks:  Right 30 blocks, Left 32 blocks  06/09/23: R: 39 blocks, L: 38 blocks  UE ROM:  all movements are slowed, decreased shoulder flexion d/t ankylosing spondylitis   UE MMT:    grossly 4/5 overall  COGNITION: Overall cognitive status:  Spouse reports thinking continues to be slower  OBSERVATIONS: Bradykinesia   TODAY'S TREATMENT:                                                                          DATE:  08/13/23 Shoes: OT educated on use of shoe funnel with demonstration, providing handout for pt's spouse to further assess and purchase if needed.  Discussed use of shoe funnel and elastic laces vs purchasing skechers slip ins as pt with difficulty with fastening shoes and getting shoes over heel.  Recommend either or and not both based on pt's need. Bed mobility: Engaged in massed practice with sitting <> sidelying.  Pt initially attempting to place R elbow onto mat table and roll to R when getting in from L side of the bed.  OT providing demonstration of technique when placing L elbow to mat table and then completing sidelying with large amplitude lifting of legs to advance into bed.  Pt benefiting from tactile cues to facilitate weight shifting  without posterior lean.  Pt benefiting from breaking down task and then completing x1 without additional cues other than to "get into bed".  Pt demonstrating improved positioning, however will continue to benefit from practice in clinic as well as at home.    08/05/23 Sleeping: Pt reports that his R shoulder is hurting him more when he is trying to sleep and when he wakes up in the mornings.  Engaged in discussion of positioning of body and arm when in sidelying on both sides.  Pt and spouse to further assess positioning in bed as well as welcome recommendations for ease of transitioning in/out of bed. Tying shoes: Pt demonstrating difficulty with forward reaching as well as sustained figure 4 position to allow for pt to fasten shoes.  Pt also with difficulty with tying shoelaces even when just placed in lap this session.  OT educated on use of elastic shoe laces for increased ease and independence with donning shoes.  OT demonstrated use with elastic laces on personal shoes and provided pt and spouse with handout of various styles that may benefit pt best.     07/31/23 Transfers: engaged in massed practice with sit > stand and stand pivot transfers  with focus on assessing pattern with falls.  Pt demonstrating small, shuffling steps when turning 90* to sit in chair, more prevalent when turning to L than R.  Pt with initial instance of getting L foot caught under R foot, causing pt to freeze and lose balance backwards requiring mod assist to lower pt to chair.  Pt benefiting from cues for large steps and challenge to make turn in 4 steps to challenge amplitude and sequencing.  Pt demonstrating improvements in sequencing and stepping pattern, however still requiring ~8 steps each time.  Pt demonstrating decreased catching of LLE under self causing pt to lose balance backwards.  Orthostatics (assess due to reports of lightheadedness intermittently when moving)  BP seated: 125/67 HR 81 Standing: 117/65 HR 88 Standing after 2 mins: 123/63 HR 89   PATIENT EDUCATION: Education details: see treatment note above Person educated: Patient and Spouse Education method: Explanation and Handouts Education comprehension: verbalized understanding and needs further education  HOME EXERCISE PROGRAM: 06/25/23 - Handouts: V/c for sit-to-stand (see 06/25/23 pt instructions), additional copy of 05/14/23 "Bag Exercises" (see 05/14/23 pt instructions)  LSVT big exercises  Access Code: [email or web address] URL: [email or web address] Date: 06/23/2023 Prepared by: [facility] - Outpatient  Rehab - [facility]  Exercises - Seated Figure 4 Piriformis Stretch  - 1 x daily - 10 reps - 15 sec hold - Seated Piriformis Stretch  - 1 x daily - 10 reps - 15 sec hold - Seated Single Knee to Chest  - 1 x daily - 10 reps - 5 sec hold  07/09/23 - Provided examples of no-tie lace options for LB dressing. (Handout, see pt instructions).   GOALS: Goals reviewed with patient? Yes  SHORT TERM GOALS: Target date: 05/23/23  Pt will be independent in full body HEP with focus on large amplitude movements as needed to increase independence with ADLs.  Baseline: Goal  status: Not met - 05/26/23  2.  Pt will perform dynamic standing task for 5 mins as needed for simple IADLs w/o LOB using DME and/or countertop support prn  Baseline:  Goal status: IN PROGRESS  3.  Pt will demonstrate improved ease with sit > stand to get up from toilet or other seating options in home by improving score on sit > stand to decreased  fall risk.  Baseline: time TBD Goal status: IN PROGRESS  4.  Pt will demonstrate improved sitting balance and trunk control to reach towards floor to retreive items to allow progression towards LB dressing. Baseline:  Goal status: MET - 05/28/23   NEW LONG TERM GOALS: Target date: 08/29/23  Pt will report ability to complete UB/LB dressing at Supervision/setup level with use of AE and/or alternative strategies PRN.  Baseline: Min A Goal status: REVISED  2.  Pt and spouse will report understanding of adaptive techniques/strategies and/or modifications to routines to increase safety with mobility and self-care tasks in the home.  Baseline:  Goal status: IN PROGRESS  3.  Pt will demonstrate increased ease with dressing as evidenced by decreasing PPT#4(don/ doff jacket) by 10 secs or more.  Baseline: 1:05 Goal status: IN PROGRESS  4.  Pt and spouse will be independent in updated PD specific HEP to facilitate increased ROM and amplitude as needed for ADLs and quality of life.  Baseline:  Goal status: IN PROGRESS  ASSESSMENT:  CLINICAL IMPRESSION: Pt continues to demonstrate freezing with multiple cues for aspects of mobility, particularly bed mobility this session.  Pt benefiting from demonstration and cues for large amplitude when bringing BLE into bed.  PERFORMANCE DEFICITS: in functional skills including ADLs, IADLs, coordination, dexterity, ROM, strength, pain, flexibility, Fine motor control, Gross motor control, balance, body mechanics, endurance, decreased knowledge of precautions, decreased knowledge of use of DME, and UE functional  use and psychosocial skills including coping strategies, environmental adaptation, and routines and behaviors.   IMPAIRMENTS: are limiting patient from ADLs and IADLs.    PLAN:  OT FREQUENCY: 1x/week  OT DURATION: 6 weeks  PLANNED INTERVENTIONS: self care/ADL training, therapeutic exercise, therapeutic activity, neuromuscular re-education, balance training, functional mobility training, ultrasound, compression bandaging, moist heat, cryotherapy, patient/family education, cognitive remediation/compensation, psychosocial skills training, energy conservation, coping strategies training, and DME and/or AE instructions  RECOMMENDED OTHER SERVICES: NA  CONSULTED AND AGREED WITH PLAN OF CARE: Patient  PLAN FOR NEXT SESSION:   Continue to review bag exercises for carryover to dressing Large amplitude and LSVT big exercises Sit > stand Education: Safety, reducing fall risk UB and LB dressing adaptive strategies    Keelon Zurn, OTR/L 08/13/2023, 2:14 PM   [facility] at [facility] [address] [place], [place] [address] Phone # [phone] Fax # [phone]

## 2023-08-13 NOTE — Therapy (Signed)
OUTPATIENT PHYSICAL THERAPY NEURO TREATMENT NOTE  Patient Name: [name] MRN: [id number] DOB:01-04-1936, 87 y.o., male Today's Date: 08/13/2023   PCP: Garlan Fillers REFERRING PROVIDER: Huston Foley, MD     END OF SESSION:  PT End of Session - 08/13/23 1312     Visit Number 24    Number of Visits 27    Date for PT Re-Evaluation 08/27/23    Authorization Type Medicare/[employer]-NEEDS KX due to previous PT and speech this year    Progress Note Due on Visit 31    PT Start Time 1315    PT Stop Time 1400    PT Time Calculation (min) 45 min    Equipment Utilized During Treatment Gait belt    Activity Tolerance Patient tolerated treatment well    Behavior During Therapy WFL for tasks assessed/performed                          Past Medical History:  Diagnosis Date   Ankylosing spondylitis (HCC) dx'd ~ 1974   Arthritis    "back" (08/10/2015)   BENIGN PROSTATIC HYPERTROPHY, WITH OBSTRUCTION 01/15/2010   Bleeding duodenal ulcer    Bleeding esophageal ulcer    Bleeding stomach ulcer    GERD 02/22/2008   History of blood transfusion 1988   "lost ~ 1/2 of my blood volume from multiple bleeding ulcers"   History of hiatal hernia    HYPERGLYCEMIA 11/18/2007   HYPERLIPIDEMIA 02/22/2008   HYPERTENSION, UNSPECIFIED 11/10/2009   Kidney stones    Osteoarthritis    c-spine   PEPTIC ULCER DISEASE 11/17/2007   Situational depression    "son died in MVA Jun 28, 2015"   TIA (transient ischemic attack)    "not that I know of in the past; they are trying to determine if I've had one today (08/10/2015)"   Past Surgical History:  Procedure Laterality Date   CATARACT EXTRACTION W/ INTRAOCULAR LENS  IMPLANT, BILATERAL Bilateral 2013   Patient Active Problem List   Diagnosis Date Noted   Ankylosing spondylitis of cervicothoracic region (HCC) 10/16/2016   TIA (transient ischemic attack) 08/10/2015   Carotid stenosis 08/04/2014   Hyperlipidemia 02/21/2012   BENIGN PROSTATIC  HYPERTROPHY, WITH OBSTRUCTION 01/15/2010   Essential hypertension 11/10/2009   GERD 02/22/2008   NEPHROLITHIASIS, HX OF 11/17/2007    ONSET DATE: "several years"  REFERRING DIAG: G20.A2 (ICD-10-CM) - Parkinson's disease with fluctuating manifestations, unspecified whether dyskinesia present R26.81 (ICD-10-CM) - Unsteadiness on feet R41.841 (ICD-10-CM) - Cognitive communication deficit R27.8 (ICD-10-CM) - Other lack of coordination R26.2 (ICD-10-CM) - Difficulty in walking, not elsewhere classified  THERAPY DIAG:  Unsteadiness on feet  Muscle weakness (generalized)  Other symptoms and signs involving the nervous system  Abnormal posture  Parkinson's disease with fluctuating manifestations, unspecified whether dyskinesia present (HCC)  Difficulty in walking, not elsewhere classified  Rationale for Evaluation and Treatment: Rehabilitation  SUBJECTIVE:  SUBJECTIVE STATEMENT: Have had 2 falls since last here. No injuries  Pt accompanied by: significant other  PERTINENT HISTORY: PD, complex medical history of peptic ulcer disease with history of upper GI bleed, TIA, depression, degenerative disc disease in the neck, osteoarthritis, kidney stones, hyperlipidemia, hypertension, history of hiatal hernia, BPH, arthritis, and ankylosing spondylitis, and parkinsonism   PAIN:  Are you having pain? Having some discomfort in right elbow from recent fall  PRECAUTIONS: Fall  RED FLAGS: None   WEIGHT BEARING RESTRICTIONS: No  FALLS: Has patient fallen in last 6 months? Yes. Number of falls unknown, multiple  LIVING ENVIRONMENT: Lives with: lives with their spouse Lives in: House/apartment Stairs: Ramp to enter, stairs to loft and basement, ground floor set-up Has following equipment at home: Dan Humphreys -  2 wheeled  PLOF: Needs assistance with ADLs, Needs assistance with homemaking, and Needs assistance with gait, supervision for ambulation at household distances  PATIENT GOALS: improve balance, walking, reduce falls  OBJECTIVE:   TODAY'S TREATMENT: 08/13/23 Activity Comments  NU-step x 8 min. Self-selected pace emphasis on large stride length Pre-gait  Sit to stand 3x5 reps Elevated seat, UE support against yellow resistance  Seated unilat row 1x10 -10# -15# -20#  Seated hamstring curls 3x10 15#  Seated hamstring stretch 2x30 sec Foot supported on physioball  Gait training Use of t-band across RW for step length advancement x 85 ft CGA -training in negotiating turns with cues for greater step length with outside leg to reduce number of steps to negotiate 180 deg      TODAY'S TREATMENT: 08/05/2023 Activity Comments  Pt in restroom and PT works with wife to problem solve some issues with turning, walking backwards Discussed option for visual cues/targets on floor for reminders for more stagger stance foot placement to lessen retropulsion tendency  PT assists pt with gait from restroom into gym with RW Pt starts with small step length, shuffling, narrow BOS; able to improve with cues for "take as few steps as possible"  Turn to sit at chair, narrow BOS, min/mod assist with pt having strong posterior lean PT provides cues to reset posture, widen BOS and "lead with feet backwards, not trunk"-still has heavy posterior lean  Sit to stand from chair, then short distance gait with RW and turn Worked on initial stagger stance foot placement with R foot step to target  Performed gait short distance with turns with RW Narrowed wheel position, then changed to wider wheel position  Gait with RW with negotiating around obstacles Needs assist for RW steering               PATIENT EDUCATION: Education details: Visual cues/target on floor (like sticky notes or painter's tape) in typical  retropulsion areas of home, to facilitate ease of getting into stagger stance position for better balance and less posterior lean; placed wheels wider position vs narrow position to allow for wider BOS and less tipping of RW Person educated: Patient and Spouse Education method: Explanation, Demonstration, and Verbal cues Education comprehension: verbalized understanding and needs further education   Access Code: [email or web address] URL: [email or web address] Date: 05/08/2023 Prepared by: [facility] - Outpatient  Rehab - [facility]  Program Notes perform with wife's supervision  Exercises - Mini Squat with Counter Support  - 1 x daily - 5 x weekly - 2 sets - 10 reps - Staggered Stance Forward Backward Weight Shift with Counter Support  - 1 x daily - 5 x weekly - 2 sets - 10 reps -  Standing Quarter Turn with Counter Support  - 1 x daily - 5 x weekly - 2 sets - 5 reps - Seated Long Arc Quad with Ankle Weight  - 1 x daily - 5 x weekly - 3 sets - 10 reps - Seated Hip Flexion March with Ankle Weights  - 1 x daily - 5 x weekly - 3 sets - 10 reps - Seated Active Hip Flexion  - 1 x daily - 7 x weekly - 1 sets - 10 reps - 5-10 sec hold        ------------------------------------------------- Objective measures below taken at initial evaluation:  DIAGNOSTIC FINDINGS: n/a for episode  COGNITION: Overall cognitive status: History of cognitive impairments - at baseline   SENSATION: Neuropathy in bilat feet  COORDINATION: Difficulty with rapid alternating movements    MUSCLE TONE: NT   POSTURE: rounded shoulders, forward head, flexed trunk , and head down , kyphosis  LOWER EXTREMITY ROM:     Active  Right Eval Left Eval  Hip flexion    Hip extension    Hip abduction    Hip adduction    Hip internal rotation    Hip external rotation    Knee flexion 120 120  Knee extension 0 0  Ankle dorsiflexion 5 12  Ankle plantarflexion    Ankle inversion    Ankle eversion      (Blank rows = not tested)  LOWER EXTREMITY MMT:    BLE strength grossly 3+/5  BED MOBILITY:  Mod A  TRANSFERS: Assistive device utilized: Environmental consultant - 2 wheeled  Sit to stand: CGA and Min A Stand to sit: SBA Chair to chair: SBA and CGA Floor: Mod A and Max A  RAMP:  Level of Assistance: CGA Assistive device utilized: Environmental consultant - 2 wheeled Ramp Comments:   CURB:  Level of Assistance: CGA Assistive device utilized: Environmental consultant - 2 wheeled Curb Comments:   STAIRS: NT  GAIT: Gait pattern: step to pattern, decreased stride length, and poor foot clearance- Right Distance walked: 100 Assistive device utilized: Walker - 2 wheeled Level of assistance: SBA and CGA Comments: level surfaces  FUNCTIONAL TESTS:  5 times sit to stand: 45 sec w/ CGA Timed up and go (TUG): 1 min 27 sec  M-CTSIB  Condition 1: Firm Surface, EO 10 Sec, Severe and retro-LOB  Sway  Condition 2: Firm Surface, EC 3 Sec, Severe and retro-LOB  Sway  Condition 3: Foam Surface, EO  Sec,  Sway  Condition 4: Foam Surface, EC  Sec,  Sway       GOALS: Goals reviewed with patient? Yes  SHORT TERM GOALS: UPDATED Target date: 05/15/2023>05/23/2023    Patient will be independent in HEP to improve functional outcomes Baseline: has been reviewed with PT and wife previously 06/04/23 Goal status: MET 06/04/23  2.  Teach-back strategies to reduce freezing of gait during turns Baseline: 16 steps to complete 180 deg turn; unable to recall 06/04/23 Goal status: NOT MET 06/04/23  3.  Demo improved safety and independence with sit to stand completing transfers with set-up assist Baseline: CGA-min A; unchanged 06/04/23 Goal status: NOT MET 06/04/23    LONG TERM GOALS: UPDATED Target date: 06/05/2023>06/20/2023> 07/17/2023> 08/27/2023      Demo reduced risk for falls per time 35 sec TUG test Baseline: 1 min 27 sec; 1 min 15 sec 06/04/23; 54 sec 06/19/23; 38 sec (07/16/23) Goal status: IN PROGRESS   2.  Demo improved BLE  strength and balance per time of 25  sec 5xSTS test to increase independence with mobility Baseline: 45 sec; 1 min 33 sec with occasional min A and consistent verbal cues 06/04/23; 1 min 18 sec 06/19/23; (07/16/23) 41 sec w/ arm rest push-off Goal status: IN PROGRESS   3.  Demo improved safety with ambulation on level surfaces w/ supervision x 150 ft Baseline: SBA-CGA; occasional min A for balance and cueing to reduce freezing for 160 ft 06/04/23; 150 ft w/ supervision level surfaces x 2 min 45 sec Goal status: MET  4.  Improve gait speed to 1.5 ft/sec to improve efficiency of ambulation  Baseline: 0.9 ft/sec; 1.5 ft/sec (07/16/23)  Goal status: MET  ASSESSMENT:  CLINICAL IMPRESSION: Pre-gait training w/ NU-step to promote large amplitude stride and reciprocal motion. Strength training to improve LE/UE power to improve transfers with cues in position and enacting full ROM against resistance w/ improved carryover subsequent sets/reps. Transfer training against resistance to encourage greater trunk flexion over BOS and hip extension engagement to reduce tendency for retro-pulsion. Gait training with visual aid for step length and initiation with improved carryover with band placement close to back of walker encouraging knee position for swing/initial contact.  Demo to spouse for carryover of gait, difficulty with negotiating turns with ability to enact 2-3 steps of greater length but reversion back to small shuffled steps for remaining 180 degree turn.  Pt would continue to benefit from PT services to reduce functional decline and maintain level of mobility to reduce further disability and risk for falls  OBJECTIVE IMPAIRMENTS: Abnormal gait, decreased activity tolerance, decreased balance, decreased coordination, decreased mobility, difficulty walking, decreased ROM, decreased strength, improper body mechanics, and postural dysfunction.   ACTIVITY LIMITATIONS: carrying, lifting, standing, stairs,  transfers, bed mobility, and locomotion level  PARTICIPATION LIMITATIONS: meal prep, cleaning, laundry, interpersonal relationship, community activity, and exercise routine  PERSONAL FACTORS: Age, Time since onset of injury/illness/exacerbation, and 3+ comorbidities: PMH  are also affecting patient's functional outcome.   REHAB POTENTIAL: Good  CLINICAL DECISION MAKING: Evolving/moderate complexity  EVALUATION COMPLEXITY: Moderate  PLAN:  PT FREQUENCY: 1x/week  PT DURATION: 6 weeks  PLANNED INTERVENTIONS: Therapeutic exercises, Therapeutic activity, Neuromuscular re-education, Balance training, Gait training, Patient/Family education, Self Care, and Joint mobilization  PLAN FOR NEXT SESSION: Continue large amplitude pivot-turning activities, ask how it went with use of environmental visual cues; how did it go with RW with wider wheels?   2:13 PM, 08/13/23 M. Shary Decamp, PT, DPT Physical Therapist- [place] Office Number: [phone]

## 2023-08-18 ENCOUNTER — Ambulatory Visit: Payer: Medicare Other | Admitting: Physical Therapy

## 2023-08-18 ENCOUNTER — Encounter: Payer: Self-pay | Admitting: Physical Therapy

## 2023-08-18 ENCOUNTER — Ambulatory Visit: Payer: Medicare Other | Admitting: Occupational Therapy

## 2023-08-18 DIAGNOSIS — R2681 Unsteadiness on feet: Secondary | ICD-10-CM

## 2023-08-18 DIAGNOSIS — R293 Abnormal posture: Secondary | ICD-10-CM

## 2023-08-18 DIAGNOSIS — R278 Other lack of coordination: Secondary | ICD-10-CM | POA: Diagnosis not present

## 2023-08-18 DIAGNOSIS — M6281 Muscle weakness (generalized): Secondary | ICD-10-CM

## 2023-08-18 DIAGNOSIS — R29818 Other symptoms and signs involving the nervous system: Secondary | ICD-10-CM

## 2023-08-18 DIAGNOSIS — R2689 Other abnormalities of gait and mobility: Secondary | ICD-10-CM | POA: Diagnosis not present

## 2023-08-18 NOTE — Therapy (Signed)
OUTPATIENT OCCUPATIONAL THERAPY PARKINSON'S  Treatment Note  Patient Name: [name] MRN: [id number] DOB:1936/01/15, 87 y.o., male Today's Date: 08/19/2023  PCP: Garlan Fillers, MD REFERRING PROVIDER: Huston Foley, MD     END OF SESSION:  OT End of Session - 08/18/23 1726     Visit Number 23    Number of Visits 25    Date for OT Re-Evaluation 08/29/23    Authorization Type Medicare A &B    OT Start Time 1405    OT Stop Time 1445    OT Time Calculation (min) 40 min    Equipment Utilized During Treatment gait belt    Activity Tolerance Patient tolerated treatment well    Behavior During Therapy WFL for tasks assessed/performed;Flat affect                                 Past Medical History:  Diagnosis Date   Ankylosing spondylitis (HCC) dx'd ~ 1974   Arthritis    "back" (08/10/2015)   BENIGN PROSTATIC HYPERTROPHY, WITH OBSTRUCTION 01/15/2010   Bleeding duodenal ulcer    Bleeding esophageal ulcer    Bleeding stomach ulcer    GERD 02/22/2008   History of blood transfusion 1988   "lost ~ 1/2 of my blood volume from multiple bleeding ulcers"   History of hiatal hernia    HYPERGLYCEMIA 11/18/2007   HYPERLIPIDEMIA 02/22/2008   HYPERTENSION, UNSPECIFIED 11/10/2009   Kidney stones    Osteoarthritis    c-spine   PEPTIC ULCER DISEASE 11/17/2007   Situational depression    "son died in MVA 07-08-2015"   TIA (transient ischemic attack)    "not that I know of in the past; they are trying to determine if I've had one today (08/10/2015)"   Past Surgical History:  Procedure Laterality Date   CATARACT EXTRACTION W/ INTRAOCULAR LENS  IMPLANT, BILATERAL Bilateral 2013   Patient Active Problem List   Diagnosis Date Noted   Ankylosing spondylitis of cervicothoracic region (HCC) 10/16/2016   TIA (transient ischemic attack) 08/10/2015   Carotid stenosis 08/04/2014   Hyperlipidemia 02/21/2012   BENIGN PROSTATIC HYPERTROPHY, WITH OBSTRUCTION 01/15/2010    Essential hypertension 11/10/2009   GERD 02/22/2008   NEPHROLITHIASIS, HX OF 11/17/2007    ONSET DATE: referral date 03/25/23  REFERRING DIAG: G20.A2 (ICD-10-CM) - Parkinson's disease without dyskinesia, with fluctuations R26.81 (ICD-10-CM) - Unsteadiness on feet R41.841 (ICD-10-CM) - Cognitive communication deficit R27.8 (ICD-10-CM) - Other lack of coordination R26.2 (ICD-10-CM) - Difficulty in walking, not elsewhere classified  THERAPY DIAG:  Unsteadiness on feet  Muscle weakness (generalized)  Other lack of coordination  Abnormal posture  Other symptoms and signs involving the nervous system  Rationale for Evaluation and Treatment: Rehabilitation  SUBJECTIVE:   SUBJECTIVE STATEMENT: Pt reports falling when getting up from recliner, falling onto end table and arm of chair. Pt and spouse report that he is falling about once a week and it typically happens when he is turning or backing up.    Pt accompanied by: self (spouse, [name])  PERTINENT HISTORY: history of Parkinson's and ankylosing spondylitis   PRECAUTIONS: Fall  WEIGHT BEARING RESTRICTIONS: No  PAIN:  Are you having pain? No  FALLS: Has patient fallen in last 6 months? Yes. Number of falls has had multiple falls over the last few months with multiple in early July, and then 2 falls this month.  07/07/23 - Pt's spouse reports that he has not had  a fall since Weds. 07/09/23 - Pt reports fall between midnight and 3 AM last night when returning from using the bathroom.  LIVING ENVIRONMENT: Lives with: lives with their spouse Lives in: House/apartment Stairs: Yes: External: 2-3 steps; has a ramp at entrance with raised railings Internal: steps to basement and steps to loft - however pt does not need to go up/down those at this time. Has following equipment at home: Dan Humphreys - 2 wheeled, Environmental consultant - 4 wheeled, bed side commode, Shower chair, Ramped entry, and transport chair  PLOF: Requires assistive device for independence  and Needs assistance with ADLs  PATIENT GOALS: to be able to move without concern/serious concern  OBJECTIVE:   HAND DOMINANCE: Right  ADLs: Overall ADLs: Pt reports there are times that he can be "fairly independent" with dressing but there are times where spouse has to provide significant assistance with. Transfers/ambulation related to ADLs: Utilizing RW and will occasionally walk bouts through the house without RW.  If pt loses balance backwards he is unable to correct. Eating: "fumble fingered" UB Dressing: occasional assistance LB Dressing: socks and shoes are quite difficult Toileting: "I go to the bathroom by myself" but has difficulty getting up/down from toilet Bathing: washing at the sink, spouse will assist  Tub Shower transfers: pt feels uncomfortable in the shower with shower chair, but has not fallen Equipment: Shower seat with back and Walk in shower with 5.5" ledge with 30" door  IADLs: Light housekeeping: washes dishes when he is feeling strong, however frequent onset of weakness Meal Prep: Pt was primary cook prior to fall in 2023, but has returned to participation in aspects of meal prep Community mobility: recently renewed drivers license, but is not driving  MOBILITY STATUS: Needs Assist: CGA with mobility with RW and Hx of falls   POSTURE COMMENTS:  rounded shoulders and forward head, posterior pelvic tilt and R lean  FUNCTIONAL OUTCOME MEASURES: Fastening/unfastening 3 buttons: unable to button any buttons in 1 mins Physical performance test: PPT#2 (simulated eating) 22.79 sec & PPT#4 (donning/doffing jacket): TBD  COORDINATION: Box and Blocks:  Right 30 blocks, Left 32 blocks  06/09/23: R: 39 blocks, L: 38 blocks  UE ROM:  all movements are slowed, decreased shoulder flexion d/t ankylosing spondylitis   UE MMT:    grossly 4/5 overall  COGNITION: Overall cognitive status:  Spouse reports thinking continues to be slower  OBSERVATIONS:  Bradykinesia   TODAY'S TREATMENT:                                                                         DATE:  08/18/23 Large amplitude: engaged in modified LSVT big exercises to carry over to decreased burden of care with transitional movements and dressing. Pt demonstrating improved forward and downward reach when given targets to reach towards. OT providing verbal cues for upright posture when reaching up and out to side.  OT providing encouragement and use of mirror to facilitate increased upright posture and reach. Pt demonstrating increased amplitude with repetition.  Bag exercises: engaged in simulated UB and LB dressing with use of scarf/bag with focus on large amplitude movements to carryover to dressing.  Pt demonstrating intermittent use of mirror when completing simulated UB dressing.  Pt  with difficulty with LB dressing, sequencing forward weight shift and lifting leg therefore transitioned to stepping pattern over hula hoop.  Pt with improved sequencing with focus on large amplitude picking up of leg and stepping over hula hoop (while in sitting). OT educated on use of hula hoop as option for simulated aspects of dressing from threading pant legs, pulling pants over hips, donning shirt, and even horizontal movements and circular movements for improved trunk mobility.    08/13/23 Shoes: OT educated on use of shoe funnel with demonstration, providing handout for pt's spouse to further assess and purchase if needed.  Discussed use of shoe funnel and elastic laces vs purchasing skechers slip ins as pt with difficulty with fastening shoes and getting shoes over heel.  Recommend either or and not both based on pt's need. Bed mobility: Engaged in massed practice with sitting <> sidelying.  Pt initially attempting to place R elbow onto mat table and roll to R when getting in from L side of the bed.  OT providing demonstration of technique when placing L elbow to mat table and then completing  sidelying with large amplitude lifting of legs to advance into bed.  Pt benefiting from tactile cues to facilitate weight shifting without posterior lean.  Pt benefiting from breaking down task and then completing x1 without additional cues other than to "get into bed".  Pt demonstrating improved positioning, however will continue to benefit from practice in clinic as well as at home.    08/05/23 Sleeping: Pt reports that his R shoulder is hurting him more when he is trying to sleep and when he wakes up in the mornings.  Engaged in discussion of positioning of body and arm when in sidelying on both sides.  Pt and spouse to further assess positioning in bed as well as welcome recommendations for ease of transitioning in/out of bed. Tying shoes: Pt demonstrating difficulty with forward reaching as well as sustained figure 4 position to allow for pt to fasten shoes.  Pt also with difficulty with tying shoelaces even when just placed in lap this session.  OT educated on use of elastic shoe laces for increased ease and independence with donning shoes.  OT demonstrated use with elastic laces on personal shoes and provided pt and spouse with handout of various styles that may benefit pt best.     PATIENT EDUCATION: Education details: see treatment note above Person educated: Patient and Spouse Education method: Explanation and Handouts Education comprehension: verbalized understanding and needs further education  HOME EXERCISE PROGRAM: 06/25/23 - Handouts: V/c for sit-to-stand (see 06/25/23 pt instructions), additional copy of 05/14/23 "Bag Exercises" (see 05/14/23 pt instructions)  LSVT big exercises  Access Code: [email or web address] URL: [email or web address] Date: 06/23/2023 Prepared by: [facility] - [facility]  Exercises - Seated Figure 4 Piriformis Stretch  - 1 x daily - 10 reps - 15 sec hold - Seated Piriformis Stretch  - 1 x daily - 10 reps - 15 sec hold - Seated  Single Knee to Chest  - 1 x daily - 10 reps - 5 sec hold  07/09/23 - Provided examples of no-tie lace options for LB dressing. (Handout, see pt instructions).   GOALS: Goals reviewed with patient? Yes  SHORT TERM GOALS: Target date: 05/23/23  Pt will be independent in full body HEP with focus on large amplitude movements as needed to increase independence with ADLs.  Baseline: Goal status: Not met - 05/26/23  2.  Pt will perform  dynamic standing task for 5 mins as needed for simple IADLs w/o LOB using DME and/or countertop support prn  Baseline:  Goal status: IN PROGRESS  3.  Pt will demonstrate improved ease with sit > stand to get up from toilet or other seating options in home by improving score on sit > stand to decreased fall risk.  Baseline: time TBD Goal status: IN PROGRESS  4.  Pt will demonstrate improved sitting balance and trunk control to reach towards floor to retreive items to allow progression towards LB dressing. Baseline:  Goal status: MET - 05/28/23   NEW LONG TERM GOALS: Target date: 08/29/23  Pt will report ability to complete UB/LB dressing at Supervision/setup level with use of AE and/or alternative strategies PRN.  Baseline: Min A Goal status: REVISED  2.  Pt and spouse will report understanding of adaptive techniques/strategies and/or modifications to routines to increase safety with mobility and self-care tasks in the home.  Baseline:  Goal status: IN PROGRESS  3.  Pt will demonstrate increased ease with dressing as evidenced by decreasing PPT#4(don/ doff jacket) by 10 secs or more.  Baseline: 1:05 Goal status: IN PROGRESS  4.  Pt and spouse will be independent in updated PD specific HEP to facilitate increased ROM and amplitude as needed for ADLs and quality of life.  Baseline:  Goal status: IN PROGRESS  ASSESSMENT:  CLINICAL IMPRESSION: Pt continues to demonstrate difficulty with positioning of BLE during exercises and with mobility.  Pt benefits  from use of external cues from targets to mirror to facilitate improved amplitude.  Pt demonstrating improved mobility with modified LSVT big exercises with use of mirror and targets with repetition.   PERFORMANCE DEFICITS: in functional skills including ADLs, IADLs, coordination, dexterity, ROM, strength, pain, flexibility, Fine motor control, Gross motor control, balance, body mechanics, endurance, decreased knowledge of precautions, decreased knowledge of use of DME, and UE functional use and psychosocial skills including coping strategies, environmental adaptation, and routines and behaviors.   IMPAIRMENTS: are limiting patient from ADLs and IADLs.    PLAN:  OT FREQUENCY: 1x/week  OT DURATION: 6 weeks  PLANNED INTERVENTIONS: self care/ADL training, therapeutic exercise, therapeutic activity, neuromuscular re-education, balance training, functional mobility training, ultrasound, compression bandaging, moist heat, cryotherapy, patient/family education, cognitive remediation/compensation, psychosocial skills training, energy conservation, coping strategies training, and DME and/or AE instructions  RECOMMENDED OTHER SERVICES: NA  CONSULTED AND AGREED WITH PLAN OF CARE: Patient  PLAN FOR NEXT SESSION:   Continue to review bag exercises for carryover to dressing Large amplitude and LSVT big exercises Sit > stand Education: Safety, reducing fall risk UB and LB dressing adaptive strategies    Erna Brossard, OTR/L 08/19/2023, 7:27 AM   [facility] [address] [place], [place] [address] Phone # [phone] Fax # [phone]

## 2023-08-18 NOTE — Therapy (Signed)
OUTPATIENT PHYSICAL THERAPY NEURO TREATMENT NOTE  Patient Name: [name] MRN: [id number] DOB:Aug 06, 1936, 87 y.o., male Today's Date: 08/18/2023   PCP: Garlan Fillers REFERRING PROVIDER: Huston Foley, MD     END OF SESSION:  PT End of Session - 08/18/23 1318     Visit Number 25    Number of Visits 27    Date for PT Re-Evaluation 08/27/23    Authorization Type Medicare/[employer]-NEEDS KX due to previous PT and speech this year    Progress Note Due on Visit 31    PT Start Time 1319    PT Stop Time 1400    PT Time Calculation (min) 41 min    Equipment Utilized During Treatment Gait belt    Activity Tolerance Patient tolerated treatment well    Behavior During Therapy WFL for tasks assessed/performed                           Past Medical History:  Diagnosis Date   Ankylosing spondylitis (HCC) dx'd ~ 1974   Arthritis    "back" (08/10/2015)   BENIGN PROSTATIC HYPERTROPHY, WITH OBSTRUCTION 01/15/2010   Bleeding duodenal ulcer    Bleeding esophageal ulcer    Bleeding stomach ulcer    GERD 02/22/2008   History of blood transfusion 1988   "lost ~ 1/2 of my blood volume from multiple bleeding ulcers"   History of hiatal hernia    HYPERGLYCEMIA 11/18/2007   HYPERLIPIDEMIA 02/22/2008   HYPERTENSION, UNSPECIFIED 11/10/2009   Kidney stones    Osteoarthritis    c-spine   PEPTIC ULCER DISEASE 11/17/2007   Situational depression    "son died in MVA 2015-07-02"   TIA (transient ischemic attack)    "not that I know of in the past; they are trying to determine if I've had one today (08/10/2015)"   Past Surgical History:  Procedure Laterality Date   CATARACT EXTRACTION W/ INTRAOCULAR LENS  IMPLANT, BILATERAL Bilateral 2013   Patient Active Problem List   Diagnosis Date Noted   Ankylosing spondylitis of cervicothoracic region (HCC) 10/16/2016   TIA (transient ischemic attack) 08/10/2015   Carotid stenosis 08/04/2014   Hyperlipidemia 02/21/2012   BENIGN PROSTATIC  HYPERTROPHY, WITH OBSTRUCTION 01/15/2010   Essential hypertension 11/10/2009   GERD 02/22/2008   NEPHROLITHIASIS, HX OF 11/17/2007    ONSET DATE: "several years"  REFERRING DIAG: G20.A2 (ICD-10-CM) - Parkinson's disease with fluctuating manifestations, unspecified whether dyskinesia present R26.81 (ICD-10-CM) - Unsteadiness on feet R41.841 (ICD-10-CM) - Cognitive communication deficit R27.8 (ICD-10-CM) - Other lack of coordination R26.2 (ICD-10-CM) - Difficulty in walking, not elsewhere classified  THERAPY DIAG:  Unsteadiness on feet  Muscle weakness (generalized)  Other symptoms and signs involving the nervous system  Rationale for Evaluation and Treatment: Rehabilitation  SUBJECTIVE:  SUBJECTIVE STATEMENT: No falls, an almost fall on Saturday night.  Wife reports she thinks the walker is more stable with the wheels set wider.  Pt accompanied by: significant other  PERTINENT HISTORY: PD, complex medical history of peptic ulcer disease with history of upper GI bleed, TIA, depression, degenerative disc disease in the neck, osteoarthritis, kidney stones, hyperlipidemia, hypertension, history of hiatal hernia, BPH, arthritis, and ankylosing spondylitis, and parkinsonism   PAIN:  Are you having pain? Having some discomfort in right elbow from recent fall  PRECAUTIONS: Fall  RED FLAGS: None   WEIGHT BEARING RESTRICTIONS: No  FALLS: Has patient fallen in last 6 months? Yes. Number of falls unknown, multiple  LIVING ENVIRONMENT: Lives with: lives with their spouse Lives in: House/apartment Stairs: Ramp to enter, stairs to loft and basement, ground floor set-up Has following equipment at home: Dan Humphreys - 2 wheeled  PLOF: Needs assistance with ADLs, Needs assistance with homemaking, and Needs  assistance with gait, supervision for ambulation at household distances  PATIENT GOALS: improve balance, walking, reduce falls  OBJECTIVE:    TODAY'S TREATMENT: 08/18/2023 Activity Comments  NuStep, Level 4, 4 extremities x 8 minutes Cues for long "strides" with Nustep, SPM 70-mid 80s, 30 second bouts >80-90  Gait activities in parallel bars: Forward/back walk x 5 reps Step forward/back 2 x 10 reps    Step to target, cues for longer posterior step length  Sit to stand then R foot forward for stagger stance initially No posterior lean noted today  Worked on gait with wider BOS 25 ft x 4 reps 75 ft x 2 reps Used visual cues of light/dark tile for wider BOS; used Vcs to stop and reset then start again to "take as few steps as possible"  Gait between activities in session, short distance with RW PT provides supervision and VCs       PATIENT EDUCATION: Education details: Cues for "take as few steps as possible" to encourage long, wide steps; cues to stop and reset with stagger stance, then reset with the same cues above Person educated: Patient and Spouse Education method: Explanation, Demonstration, and Verbal cues Education comprehension: verbalized understanding and needs further education   Access Code: [email or web address] URL: [email or web address] Date: 05/08/2023 Prepared by: [facility] - Outpatient  Rehab - [facility]  Program Notes perform with wife's supervision  Exercises - Mini Squat with Counter Support  - 1 x daily - 5 x weekly - 2 sets - 10 reps - Staggered Stance Forward Backward Weight Shift with Counter Support  - 1 x daily - 5 x weekly - 2 sets - 10 reps - Standing Quarter Turn with Counter Support  - 1 x daily - 5 x weekly - 2 sets - 5 reps - Seated Long Arc Quad with Ankle Weight  - 1 x daily - 5 x weekly - 3 sets - 10 reps - Seated Hip Flexion March with Ankle Weights  - 1 x daily - 5 x weekly - 3 sets - 10 reps - Seated Active Hip Flexion  - 1 x  daily - 7 x weekly - 1 sets - 10 reps - 5-10 sec hold        ------------------------------------------------- Objective measures below taken at initial evaluation:  DIAGNOSTIC FINDINGS: n/a for episode  COGNITION: Overall cognitive status: History of cognitive impairments - at baseline   SENSATION: Neuropathy in bilat feet  COORDINATION: Difficulty with rapid alternating movements    MUSCLE TONE: NT   POSTURE: rounded shoulders, forward  head, flexed trunk , and head down , kyphosis  LOWER EXTREMITY ROM:     Active  Right Eval Left Eval  Hip flexion    Hip extension    Hip abduction    Hip adduction    Hip internal rotation    Hip external rotation    Knee flexion 120 120  Knee extension 0 0  Ankle dorsiflexion 5 12  Ankle plantarflexion    Ankle inversion    Ankle eversion     (Blank rows = not tested)  LOWER EXTREMITY MMT:    BLE strength grossly 3+/5  BED MOBILITY:  Mod A  TRANSFERS: Assistive device utilized: Environmental consultant - 2 wheeled  Sit to stand: CGA and Min A Stand to sit: SBA Chair to chair: SBA and CGA Floor: Mod A and Max A  RAMP:  Level of Assistance: CGA Assistive device utilized: Environmental consultant - 2 wheeled Ramp Comments:   CURB:  Level of Assistance: CGA Assistive device utilized: Environmental consultant - 2 wheeled Curb Comments:   STAIRS: NT  GAIT: Gait pattern: step to pattern, decreased stride length, and poor foot clearance- Right Distance walked: 100 Assistive device utilized: Walker - 2 wheeled Level of assistance: SBA and CGA Comments: level surfaces  FUNCTIONAL TESTS:  5 times sit to stand: 45 sec w/ CGA Timed up and go (TUG): 1 min 27 sec  M-CTSIB  Condition 1: Firm Surface, EO 10 Sec, Severe and retro-LOB  Sway  Condition 2: Firm Surface, EC 3 Sec, Severe and retro-LOB  Sway  Condition 3: Foam Surface, EO  Sec,  Sway  Condition 4: Foam Surface, EC  Sec,  Sway       GOALS: Goals reviewed with patient? Yes  SHORT TERM GOALS:  UPDATED Target date: 05/15/2023>05/23/2023    Patient will be independent in HEP to improve functional outcomes Baseline: has been reviewed with PT and wife previously 06/04/23 Goal status: MET 06/04/23  2.  Teach-back strategies to reduce freezing of gait during turns Baseline: 16 steps to complete 180 deg turn; unable to recall 06/04/23 Goal status: NOT MET 06/04/23  3.  Demo improved safety and independence with sit to stand completing transfers with set-up assist Baseline: CGA-min A; unchanged 06/04/23 Goal status: NOT MET 06/04/23    LONG TERM GOALS: UPDATED Target date: 06/05/2023>06/20/2023> 07/17/2023> 08/27/2023      Demo reduced risk for falls per time 35 sec TUG test Baseline: 1 min 27 sec; 1 min 15 sec 06/04/23; 54 sec 06/19/23; 38 sec (07/16/23) Goal status: IN PROGRESS   2.  Demo improved BLE strength and balance per time of 25 sec 5xSTS test to increase independence with mobility Baseline: 45 sec; 1 min 33 sec with occasional min A and consistent verbal cues 06/04/23; 1 min 18 sec 06/19/23; (07/16/23) 41 sec w/ arm rest push-off Goal status: IN PROGRESS   3.  Demo improved safety with ambulation on level surfaces w/ supervision x 150 ft Baseline: SBA-CGA; occasional min A for balance and cueing to reduce freezing for 160 ft 06/04/23; 150 ft w/ supervision level surfaces x 2 min 45 sec Goal status: MET  4.  Improve gait speed to 1.5 ft/sec to improve efficiency of ambulation  Baseline: 0.9 ft/sec; 1.5 ft/sec (07/16/23)  Goal status: MET  ASSESSMENT:  CLINICAL IMPRESSION: Utilized Nustep for aerobic warm up, with cues for intervals of increased stride length and speed, for optimal warm-up.  Pt utilizes his walker today, continueing to have the wheels set in the wider  position.  He seems to have better control of his walker this way (compared to narrow wheels).  Worked on forward/back walking and gait with RW to encourage wider BOS.  Attempted to use visual cues to widen BOS,  but RLE tends to adduct with gait despite visual cues.  He does best today with verbal cues to "take as few steps as possible" and then "stop to reset" when he feet become narrow and shuffling.  Discussed with pt and wife how they can utilize these cues at home to help achieve the optimal BOS and gait pattern.  They verbalize understanding.  OBJECTIVE IMPAIRMENTS: Abnormal gait, decreased activity tolerance, decreased balance, decreased coordination, decreased mobility, difficulty walking, decreased ROM, decreased strength, improper body mechanics, and postural dysfunction.   ACTIVITY LIMITATIONS: carrying, lifting, standing, stairs, transfers, bed mobility, and locomotion level  PARTICIPATION LIMITATIONS: meal prep, cleaning, laundry, interpersonal relationship, community activity, and exercise routine  PERSONAL FACTORS: Age, Time since onset of injury/illness/exacerbation, and 3+ comorbidities: PMH  are also affecting patient's functional outcome.   REHAB POTENTIAL: Good  CLINICAL DECISION MAKING: Evolving/moderate complexity  EVALUATION COMPLEXITY: Moderate  PLAN:  PT FREQUENCY: 1x/week  PT DURATION: 6 weeks  PLANNED INTERVENTIONS: Therapeutic exercises, Therapeutic activity, Neuromuscular re-education, Balance training, Gait training, Patient/Family education, Self Care, and Joint mobilization  PLAN FOR NEXT SESSION: Continue towards LTGs.  *Discuss POC, as current POC ends 08/27/23 (pt is scheduled further out).  Continue large amplitude pivot-turning activities  Lonia Blood, PT 08/18/23 3:03 PM Phone: [phone] Fax: [phone]  [facility] at [facility] [address] [place], [place] [address] Phone # [phone] Fax # [phone]

## 2023-08-25 DIAGNOSIS — R41841 Cognitive communication deficit: Secondary | ICD-10-CM | POA: Diagnosis not present

## 2023-08-25 DIAGNOSIS — R262 Difficulty in walking, not elsewhere classified: Secondary | ICD-10-CM | POA: Diagnosis not present

## 2023-08-25 DIAGNOSIS — Z515 Encounter for palliative care: Secondary | ICD-10-CM | POA: Diagnosis not present

## 2023-08-25 DIAGNOSIS — R296 Repeated falls: Secondary | ICD-10-CM | POA: Diagnosis not present

## 2023-08-25 DIAGNOSIS — G20B2 Parkinson's disease with dyskinesia, with fluctuations: Secondary | ICD-10-CM | POA: Diagnosis not present

## 2023-08-25 DIAGNOSIS — R278 Other lack of coordination: Secondary | ICD-10-CM | POA: Diagnosis not present

## 2023-08-26 ENCOUNTER — Ambulatory Visit: Payer: Medicare Other | Admitting: Occupational Therapy

## 2023-08-26 ENCOUNTER — Ambulatory Visit: Payer: Medicare Other

## 2023-08-26 DIAGNOSIS — R293 Abnormal posture: Secondary | ICD-10-CM | POA: Diagnosis not present

## 2023-08-26 DIAGNOSIS — R278 Other lack of coordination: Secondary | ICD-10-CM

## 2023-08-26 DIAGNOSIS — M6281 Muscle weakness (generalized): Secondary | ICD-10-CM | POA: Diagnosis not present

## 2023-08-26 DIAGNOSIS — R2689 Other abnormalities of gait and mobility: Secondary | ICD-10-CM | POA: Diagnosis not present

## 2023-08-26 DIAGNOSIS — R29818 Other symptoms and signs involving the nervous system: Secondary | ICD-10-CM | POA: Diagnosis not present

## 2023-08-26 DIAGNOSIS — R2681 Unsteadiness on feet: Secondary | ICD-10-CM

## 2023-08-26 DIAGNOSIS — G20A2 Parkinson's disease without dyskinesia, with fluctuations: Secondary | ICD-10-CM

## 2023-08-26 DIAGNOSIS — R262 Difficulty in walking, not elsewhere classified: Secondary | ICD-10-CM

## 2023-08-26 NOTE — Therapy (Signed)
 OUTPATIENT OCCUPATIONAL THERAPY PARKINSON'S  Treatment Note & Re-cert for Skilled maintenance plan Patient Name: [name] MRN: [id number] DOB:1936-06-24, 87 y.o., male Today's Date: 08/26/2023  PCP: Yolande Toribio MATSU, MD REFERRING PROVIDER: Buck Saucer, MD     END OF SESSION:  OT End of Session - 08/26/23 1406     Visit Number 24    Number of Visits 32    Date for OT Re-Evaluation 10/24/23    Authorization Type Medicare A &B    OT Start Time 1405    OT Stop Time 1445    OT Time Calculation (min) 40 min    Equipment Utilized During Treatment gait belt    Activity Tolerance Patient tolerated treatment well    Behavior During Therapy WFL for tasks assessed/performed;Flat affect                                  Past Medical History:  Diagnosis Date   Ankylosing spondylitis (HCC) dx'd ~ 1974   Arthritis    back (08/10/2015)   BENIGN PROSTATIC HYPERTROPHY, WITH OBSTRUCTION 01/15/2010   Bleeding duodenal ulcer    Bleeding esophageal ulcer    Bleeding stomach ulcer    GERD 02/22/2008   History of blood transfusion 1988   lost ~ 1/2 of my blood volume from multiple bleeding ulcers   History of hiatal hernia    HYPERGLYCEMIA 11/18/2007   HYPERLIPIDEMIA 02/22/2008   HYPERTENSION, UNSPECIFIED 11/10/2009   Kidney stones    Osteoarthritis    c-spine   PEPTIC ULCER DISEASE 11/17/2007   Situational depression    son died in MVA 06/11/2015   TIA (transient ischemic attack)    not that I know of in the past; they are trying to determine if I've had one today (08/10/2015)   Past Surgical History:  Procedure Laterality Date   CATARACT EXTRACTION W/ INTRAOCULAR LENS  IMPLANT, BILATERAL Bilateral 2013   Patient Active Problem List   Diagnosis Date Noted   Ankylosing spondylitis of cervicothoracic region (HCC) 10/16/2016   TIA (transient ischemic attack) 08/10/2015   Carotid stenosis 08/04/2014   Hyperlipidemia 02/21/2012   BENIGN PROSTATIC  HYPERTROPHY, WITH OBSTRUCTION 01/15/2010   Essential hypertension 11/10/2009   GERD 02/22/2008   NEPHROLITHIASIS, HX OF 11/17/2007    ONSET DATE: referral date 03/25/23  REFERRING DIAG: G20.A2 (ICD-10-CM) - Parkinson's disease without dyskinesia, with fluctuations R26.81 (ICD-10-CM) - Unsteadiness on feet R41.841 (ICD-10-CM) - Cognitive communication deficit R27.8 (ICD-10-CM) - Other lack of coordination R26.2 (ICD-10-CM) - Difficulty in walking, not elsewhere classified  THERAPY DIAG:  Unsteadiness on feet  Muscle weakness (generalized)  Other symptoms and signs involving the nervous system  Abnormal posture  Other lack of coordination  Rationale for Evaluation and Treatment: Rehabilitation  SUBJECTIVE:   SUBJECTIVE STATEMENT: Pt's spouse requesting additional visits to continue to focus on amplitude and transitioning afterwards to more home based and gym based activity as tolerated.  Pt accompanied by: self (spouse, [name])  PERTINENT HISTORY: history of Parkinson's and ankylosing spondylitis   PRECAUTIONS: Fall  WEIGHT BEARING RESTRICTIONS: No  PAIN:  Are you having pain? No  FALLS: Has patient fallen in last 6 months? Yes. Number of falls has had multiple falls over the last few months with multiple in early July, and then 2 falls this month.  07/07/23 - Pt's spouse reports that he has not had a fall since Weds. 07/09/23 - Pt reports fall between midnight  and 3 AM last night when returning from using the bathroom.  LIVING ENVIRONMENT: Lives with: lives with their spouse Lives in: House/apartment Stairs: Yes: External: 2-3 steps; has a ramp at entrance with raised railings Internal: steps to basement and steps to loft - however pt does not need to go up/down those at this time. Has following equipment at home: [name] - 2 wheeled, Environmental Consultant - 4 wheeled, bed side commode, Shower chair, Ramped entry, and transport chair  PLOF: Requires assistive device for independence and  Needs assistance with ADLs  PATIENT GOALS: to be able to move without concern/serious concern  OBJECTIVE:   HAND DOMINANCE: Right  ADLs: Overall ADLs: Pt reports there are times that he can be fairly independent with dressing but there are times where spouse has to provide significant assistance with. Transfers/ambulation related to ADLs: Utilizing RW and will occasionally walk bouts through the house without RW.  If pt loses balance backwards he is unable to correct. Eating: fumble fingered UB Dressing: occasional assistance LB Dressing: socks and shoes are quite difficult Toileting: I go to the bathroom by myself but has difficulty getting up/down from toilet Bathing: washing at the sink, spouse will assist  Tub Shower transfers: pt feels uncomfortable in the shower with shower chair, but has not fallen Equipment: Shower seat with back and Walk in shower with 5.5 ledge with 30 door  IADLs: Light housekeeping: washes dishes when he is feeling strong, however frequent onset of weakness Meal Prep: Pt was primary cook prior to fall in 2023, but has returned to participation in aspects of meal prep Community mobility: recently renewed drivers license, but is not driving  MOBILITY STATUS: Needs Assist: CGA with mobility with RW and Hx of falls   POSTURE COMMENTS:  rounded shoulders and forward head, posterior pelvic tilt and R lean  FUNCTIONAL OUTCOME MEASURES: Fastening/unfastening 3 buttons: unable to button any buttons in 1 mins Physical performance test: PPT#2 (simulated eating) 22.79 sec & PPT#4 (donning/doffing jacket): 1:05  COORDINATION: Box and Blocks:  Right 30 blocks, Left 32 blocks  06/09/23: R: 39 blocks, L: 38 blocks  UE ROM:  all movements are slowed, decreased shoulder flexion d/t ankylosing spondylitis   UE MMT:    grossly 4/5 overall  COGNITION: Overall cognitive status:  Spouse reports thinking continues to be slower  OBSERVATIONS:  Bradykinesia   TODAY'S TREATMENT:                                                                         DATE:  08/26/23 Self-care: pt reports that aspects of dressing can vary.  Pt reports that he is generally able to get a shirt on with only supervision/setup but when in a time crunch that he has more difficulty. Pt sill requires assistance to get socks started and then he is able to pull them on the remainder of the way.  OT re-educated on use of shoe funnel to aid in donning shoes.  Completed massed practice first with demonstration from therapist followed by attempts of pt to complete.  Pt continues to require mod-max cues for setup and sequencing with novel adaptation/equipment.  Pt's spouse still interested in use of shoe funnel with plan to purchase and continue to  address. Large amplitude: engaged in modified LSVT big exercises to carry over to decreased burden of care with transitional movements and dressing. Pt demonstrating improved forward and downward reach when given targets to reach towards and verbal cues from therapist to further facilitate ROM. OT providing verbal cues and targets for upright posture when reaching up and out to side.  OT providing encouragement and use of mirror to facilitate increased upright posture and reach. Pt demonstrating waxing and waning amplitude with repetition.  Don/doff jacket: Trial 1: 1:03 and Trial 2: 1:08  Pt demonstrating good carryover of large amplitude technique when doffing jacket, however continues to have difficulty with sequencing and mobility when donning jacket.    08/18/23 Large amplitude: engaged in modified LSVT big exercises to carry over to decreased burden of care with transitional movements and dressing. Pt demonstrating improved forward and downward reach when given targets to reach towards. OT providing verbal cues for upright posture when reaching up and out to side.  OT providing encouragement and use of mirror to facilitate  increased upright posture and reach. Pt demonstrating increased amplitude with repetition.  Bag exercises: engaged in simulated UB and LB dressing with use of scarf/bag with focus on large amplitude movements to carryover to dressing.  Pt demonstrating intermittent use of mirror when completing simulated UB dressing.  Pt with difficulty with LB dressing, sequencing forward weight shift and lifting leg therefore transitioned to stepping pattern over hula hoop.  Pt with improved sequencing with focus on large amplitude picking up of leg and stepping over hula hoop (while in sitting). OT educated on use of hula hoop as option for simulated aspects of dressing from threading pant legs, pulling pants over hips, donning shirt, and even horizontal movements and circular movements for improved trunk mobility.    08/13/23 Shoes: OT educated on use of shoe funnel with demonstration, providing handout for pt's spouse to further assess and purchase if needed.  Discussed use of shoe funnel and elastic laces vs purchasing skechers slip ins as pt with difficulty with fastening shoes and getting shoes over heel.  Recommend either or and not both based on pt's need. Bed mobility: Engaged in massed practice with sitting <> sidelying.  Pt initially attempting to place R elbow onto mat table and roll to R when getting in from L side of the bed.  OT providing demonstration of technique when placing L elbow to mat table and then completing sidelying with large amplitude lifting of legs to advance into bed.  Pt benefiting from tactile cues to facilitate weight shifting without posterior lean.  Pt benefiting from breaking down task and then completing x1 without additional cues other than to get into bed.  Pt demonstrating improved positioning, however will continue to benefit from practice in clinic as well as at home.   PATIENT EDUCATION: Education details: see treatment note above, educated on skilled maintenance plan as  appropriate treatment due to pt's current status  Person educated: Patient and Spouse Education method: Explanation and Verbal cues Education comprehension: verbalized understanding and needs further education  HOME EXERCISE PROGRAM: 06/25/23 - Handouts: V/c for sit-to-stand (see 06/25/23 pt instructions), additional copy of 05/14/23 Bag Exercises (see 05/14/23 pt instructions)  LSVT big exercises  Access Code: [email or web address] URL: [email or web address] Date: 06/23/2023 Prepared by: [facility] - Outpatient  Rehab - [facility]  Exercises - Seated Figure 4 Piriformis Stretch  - 1 x daily - 10 reps - 15 sec hold - Seated Piriformis Stretch  - 1  x daily - 10 reps - 15 sec hold - Seated Single Knee to Chest  - 1 x daily - 10 reps - 5 sec hold  07/09/23 - Provided examples of no-tie lace options for LB dressing. (Handout, see pt instructions).   GOALS: Goals reviewed with patient? Yes  SHORT TERM GOALS: Target date: 05/23/23  Pt will be independent in full body HEP with focus on large amplitude movements as needed to increase independence with ADLs.  Baseline: Goal status: Not met - 05/26/23  2.  Pt will perform dynamic standing task for 5 mins as needed for simple IADLs w/o LOB using DME and/or countertop support prn  Baseline:  Goal status: IN PROGRESS  3.  Pt will demonstrate improved ease with sit > stand to get up from toilet or other seating options in home by improving score on sit > stand to decreased fall risk.  Baseline: time TBD Goal status: IN PROGRESS  4.  Pt will demonstrate improved sitting balance and trunk control to reach towards floor to retreive items to allow progression towards LB dressing. Baseline:  Goal status: MET - 05/28/23  NEW SHORT TERM GOALS: Target date: 09/26/23  Pt and spouse will be independent in full body HEP with focus on large amplitude movements as needed to maintain/preserve function as pertaining to ADLs/IADLs. Baseline: Goal  status: INITIAL  2.  Pt will demonstrate ability to complete sit > stand as needed to get up from toilet or other seating options in home by completing x3 throughout session without cues or physical assistance to maintain/preserve function. Baseline: occasionally requiring cues and/or physical assistance Goal status: INITIAL  3. Pt and spouse will verbalize understanding of tips for fall prevention.  Baseline:  Goal status: INITIAL   LONG TERM GOALS: Target date: 08/29/23  Pt will report ability to complete UB/LB dressing at Supervision/setup level with use of AE and/or alternative strategies PRN.  Baseline: Min A Goal status: REVISED  2.  Pt and spouse will report understanding of adaptive techniques/strategies and/or modifications to routines to increase safety with mobility and self-care tasks in the home.  Baseline:  Goal status: IN PROGRESS  3.  Pt will demonstrate increased ease with dressing as evidenced by decreasing PPT#4(don/ doff jacket) by 10 secs or more.  Baseline: 1:05 Goal status: IN PROGRESS  4.  Pt and spouse will be independent in updated PD specific HEP to facilitate increased ROM and amplitude as needed for ADLs and quality of life.  Baseline:  Goal status: IN PROGRESS  NEW LONG TERM GOALS: Target date: 10/24/23  Pt will report ability to complete UB/LB dressing at Supervision/setup level with use of AE and/or alternative strategies PRN.  Baseline: Min A Goal status: REVISED  2.  Pt and spouse will report understanding of adaptive techniques/strategies and/or modifications to routines to increase safety with mobility and self-care tasks in the home.  Baseline:  Goal status: IN PROGRESS  3.  Pt will demonstrate ability to maintain sequencing and timing with don/doff jacket to maintain in 1-1:10 minute range with use of adaptive strategies PRN to preserve function with UB dressing and don/doff jacket.  Baseline: 1:05 Goal status: REVISED  4.  Pt and spouse  will be independent in updated PD specific HEP to maintain/preserve function as needed for ADLs and quality of life. Baseline: Goal status: REVISED  ASSESSMENT:  CLINICAL IMPRESSION: Pt will benefit from Skilled maintenance plan as pt with significant PMH and co-morbidities, including PD, ankylosing spondylitis with frequent falls.  These limit pt's independent carryover with PD exercises during OT sessions as well as in the home, strategies to aid in ease and independence with ADLs, and safety with transitional movements in home.  Pt continues to benefit from max cueing for sequencing and amplitude during simulated self-care tasks and tasks to carry over to improved function with ADLs.  Pt will benefit from skill of OT to help patient and spouse perform ongoing exercises and mobility, as well as continued problem solving of adaptive techniques, strategies, and equipment to maintain and preserve functional engagement in ADLs, to prevent rapid and significant decline in functional mobility and participation in self-care tasks (which would increase caregiver burden and further increase pt's risk of falls).  Therefore continued skilled maintenance therapy plan is recommended for this patient.  Pt's spouse and pt in understanding of current recommendations and plan to continue to maintain and preserve function as it applies to ADLs and safety mobility in the home.  PERFORMANCE DEFICITS: in functional skills including ADLs, IADLs, coordination, dexterity, ROM, strength, pain, flexibility, Fine motor control, Gross motor control, balance, body mechanics, endurance, decreased knowledge of precautions, decreased knowledge of use of DME, and UE functional use and psychosocial skills including coping strategies, environmental adaptation, and routines and behaviors.   IMPAIRMENTS: are limiting patient from ADLs and IADLs.    PLAN:  OT FREQUENCY: 1x/week  OT DURATION: 8 weeks  PLANNED INTERVENTIONS: self  care/ADL training, therapeutic exercise, therapeutic activity, neuromuscular re-education, balance training, functional mobility training, ultrasound, compression bandaging, moist heat, cryotherapy, patient/family education, cognitive remediation/compensation, psychosocial skills training, energy conservation, coping strategies training, and DME and/or AE instructions  RECOMMENDED OTHER SERVICES: NA  CONSULTED AND AGREED WITH PLAN OF CARE: Patient  PLAN FOR NEXT SESSION:   Continue to review bag exercises for carryover to dressing Large amplitude and LSVT big exercises Sit > stand Education: Safety, reducing fall risk UB and LB dressing adaptive strategies    Kaylem Gidney, OTR/L 08/26/2023, 2:11 PM   [facility] at [facility] [address], [address] [place], [place] [address] Phone # [phone] Fax # [phone]

## 2023-08-26 NOTE — Therapy (Addendum)
 OUTPATIENT PHYSICAL THERAPY NEURO TREATMENT NOTE, Progress Note, and Recertification  Patient Name: [name] MRN: [id number] DOB:[birth year], 87 y.o., male Today's Date: 08/26/2023   PCP: Yolande Toribio MATSU REFERRING PROVIDER: Buck Saucer, MD    Progress Note Reporting Period 07/16/23 to 08/26/23  See note below for Objective Data and Assessment of Progress/Goals.     END OF SESSION:  PT End of Session - 08/26/23 1313     Visit Number 26    Number of Visits 34    Date for PT Re-Evaluation 10/21/23    Authorization Type Medicare/[employer]-NEEDS KX due to previous PT and speech this year    Progress Note Due on Visit 36    PT Start Time 1315    PT Stop Time 1400    PT Time Calculation (min) 45 min    Equipment Utilized During Treatment Gait belt    Activity Tolerance Patient tolerated treatment well    Behavior During Therapy WFL for tasks assessed/performed                           Past Medical History:  Diagnosis Date   Ankylosing spondylitis (HCC) dx'd ~ 1974   Arthritis    back (08/10/2015)   BENIGN PROSTATIC HYPERTROPHY, WITH OBSTRUCTION 01/15/2010   Bleeding duodenal ulcer    Bleeding esophageal ulcer    Bleeding stomach ulcer    GERD 02/22/2008   History of blood transfusion 1988   lost ~ 1/2 of my blood volume from multiple bleeding ulcers   History of hiatal hernia    HYPERGLYCEMIA 11/18/2007   HYPERLIPIDEMIA 02/22/2008   HYPERTENSION, UNSPECIFIED 11/10/2009   Kidney stones    Osteoarthritis    c-spine   PEPTIC ULCER DISEASE 11/17/2007   Situational depression    son died in MVA 17-Jun-2015   TIA (transient ischemic attack)    not that I know of in the past; they are trying to determine if I've had one today (08/10/2015)   Past Surgical History:  Procedure Laterality Date   CATARACT EXTRACTION W/ INTRAOCULAR LENS  IMPLANT, BILATERAL Bilateral 2013   Patient Active Problem List   Diagnosis Date Noted   Ankylosing spondylitis of  cervicothoracic region (HCC) 10/16/2016   TIA (transient ischemic attack) 08/10/2015   Carotid stenosis 08/04/2014   Hyperlipidemia 02/21/2012   BENIGN PROSTATIC HYPERTROPHY, WITH OBSTRUCTION 01/15/2010   Essential hypertension 11/10/2009   GERD 02/22/2008   NEPHROLITHIASIS, HX OF 11/17/2007    ONSET DATE: several years  REFERRING DIAG: G20.A2 (ICD-10-CM) - Parkinson's disease with fluctuating manifestations, unspecified whether dyskinesia present R26.81 (ICD-10-CM) - Unsteadiness on feet R41.841 (ICD-10-CM) - Cognitive communication deficit R27.8 (ICD-10-CM) - Other lack of coordination R26.2 (ICD-10-CM) - Difficulty in walking, not elsewhere classified  THERAPY DIAG:  Unsteadiness on feet  Muscle weakness (generalized)  Other symptoms and signs involving the nervous system  Abnormal posture  Parkinson's disease with fluctuating manifestations, unspecified whether dyskinesia present (HCC)  Difficulty in walking, not elsewhere classified  Other abnormalities of gait and mobility  Rationale for Evaluation and Treatment: Rehabilitation  SUBJECTIVE:  SUBJECTIVE STATEMENT: Not having a good day, lots of backwards falling.  Had fall at home exiting bathroom and fell backwards onto stairs by the door.   Pt accompanied by: significant other  PERTINENT HISTORY: PD, complex medical history of peptic ulcer disease with history of upper GI bleed, TIA, depression, degenerative disc disease in the neck, osteoarthritis, kidney stones, hyperlipidemia, hypertension, history of hiatal hernia, BPH, arthritis, and ankylosing spondylitis, and parkinsonism   PAIN:  Are you having pain? Having some discomfort in right elbow from recent fall  PRECAUTIONS: Fall  RED FLAGS: None   WEIGHT BEARING  RESTRICTIONS: No  FALLS: Has patient fallen in last 6 months? Yes. Number of falls unknown, multiple  LIVING ENVIRONMENT: Lives with: lives with their spouse Lives in: House/apartment Stairs: Ramp to enter, stairs to loft and basement, ground floor set-up Has following equipment at home: [name] - 2 wheeled  PLOF: Needs assistance with ADLs, Needs assistance with homemaking, and Needs assistance with gait, supervision for ambulation at household distances  PATIENT GOALS: improve balance, walking, reduce falls  OBJECTIVE:   TODAY'S TREATMENT: 08/26/23 Activity Comments  TUG test 2 min  5xSTS Trial 1) 1 min 15 sec Trial 2) 52 sec  Gait w/ RW and CGA   Gait speed 44.5 sec = 0.7 ft/sec  Freezing of gait strategies          TODAY'S TREATMENT: 08/18/2023 Activity Comments  NuStep, Level 4, 4 extremities x 8 minutes Cues for long strides with Nustep, SPM 70-mid 80s, 30 second bouts >80-90  Gait activities in parallel bars: Forward/back walk x 5 reps Step forward/back 2 x 10 reps    Step to target, cues for longer posterior step length  Sit to stand then R foot forward for stagger stance initially No posterior lean noted today  Worked on gait with wider BOS 25 ft x 4 reps 75 ft x 2 reps Used visual cues of light/dark tile for wider BOS; used Vcs to stop and reset then start again to take as few steps as possible  Gait between activities in session, short distance with RW PT provides supervision and VCs       PATIENT EDUCATION: Education details: Cues for take as few steps as possible to encourage long, wide steps; cues to stop and reset with stagger stance, then reset with the same cues above Person educated: Patient and Spouse Education method: Explanation, Demonstration, and Verbal cues Education comprehension: verbalized understanding and needs further education   Access Code: [email or web address] URL: [email or web address] Date: 05/08/2023 Prepared by: [facility] -  Outpatient  Rehab - [facility]  Program Notes perform with wife's supervision  Exercises - Mini Squat with Counter Support  - 1 x daily - 5 x weekly - 2 sets - 10 reps - Staggered Stance Forward Backward Weight Shift with Counter Support  - 1 x daily - 5 x weekly - 2 sets - 10 reps - Standing Quarter Turn with Counter Support  - 1 x daily - 5 x weekly - 2 sets - 5 reps - Seated Long Arc Quad with Ankle Weight  - 1 x daily - 5 x weekly - 3 sets - 10 reps - Seated Hip Flexion March with Ankle Weights  - 1 x daily - 5 x weekly - 3 sets - 10 reps - Seated Active Hip Flexion  - 1 x daily - 7 x weekly - 1 sets - 10 reps - 5-10 sec hold        -------------------------------------------------  Objective measures below taken at initial evaluation:  DIAGNOSTIC FINDINGS: n/a for episode  COGNITION: Overall cognitive status: History of cognitive impairments - at baseline   SENSATION: Neuropathy in bilat feet  COORDINATION: Difficulty with rapid alternating movements    MUSCLE TONE: NT   POSTURE: rounded shoulders, forward head, flexed trunk , and head down , kyphosis  LOWER EXTREMITY ROM:     Active  Right Eval Left Eval  Hip flexion    Hip extension    Hip abduction    Hip adduction    Hip internal rotation    Hip external rotation    Knee flexion 120 120  Knee extension 0 0  Ankle dorsiflexion 5 12  Ankle plantarflexion    Ankle inversion    Ankle eversion     (Blank rows = not tested)  LOWER EXTREMITY MMT:    BLE strength grossly 3+/5  BED MOBILITY:  Mod A  TRANSFERS: Assistive device utilized: Environmental Consultant - 2 wheeled  Sit to stand: CGA and Min A Stand to sit: SBA Chair to chair: SBA and CGA Floor: Mod A and Max A  RAMP:  Level of Assistance: CGA Assistive device utilized: Environmental Consultant - 2 wheeled Ramp Comments:   CURB:  Level of Assistance: CGA Assistive device utilized: Environmental Consultant - 2 wheeled Curb Comments:   STAIRS: NT  GAIT: Gait  pattern: step to pattern, decreased stride length, and poor foot clearance- Right Distance walked: 100 Assistive device utilized: Walker - 2 wheeled Level of assistance: SBA and CGA Comments: level surfaces  FUNCTIONAL TESTS:  5 times sit to stand: 45 sec w/ CGA Timed up and go (TUG): 1 min 27 sec  M-CTSIB  Condition 1: Firm Surface, EO 10 Sec, Severe and retro-LOB  Sway  Condition 2: Firm Surface, EC 3 Sec, Severe and retro-LOB  Sway  Condition 3: Foam Surface, EO  Sec,  Sway  Condition 4: Foam Surface, EC  Sec,  Sway       GOALS: Goals reviewed with patient? Yes  SHORT TERM GOALS: UPDATED Target date: 05/15/2023>09/23/2023      Patient will be independent in HEP to improve functional outcomes Baseline: has been reviewed with PT and wife previously 06/04/23 Goal status: MET 06/04/23  2.  Teach-back strategies to reduce freezing of gait during turns Baseline: 16 steps to complete 180 deg turn; unable to recall 06/04/23 Goal status: NOT MET 06/04/23  3.  Demo improved safety and independence with sit to stand completing transfers with set-up assist Baseline: CGA-min A; unchanged 06/04/23 Goal status: NOT MET 06/04/23  4.  Demo ability to perform 10 backwards steps without LOB/retropulsion to improve motor control and facilitate righting reactions  Baseline: 3-5 steps before retropulsion/LOB  Goal status: INITIAL    LONG TERM GOALS: UPDATED Target date: 06/05/2023>06/20/2023> 07/17/2023> 10/21/2023        Demo reduced risk for falls per time 35 sec TUG test Baseline: 1 min 27 sec; 1 min 15 sec 06/04/23; 54 sec 06/19/23; 38 sec (07/16/23); (08/26/23) 2 min Goal status: IN PROGRESS   2.  Demo improved BLE strength and balance per time of 25 sec 5xSTS test to increase independence with mobility Baseline: 45 sec; 1 min 33 sec with occasional min A and consistent verbal cues 06/04/23; 1 min 18 sec 06/19/23; (07/16/23) 41 sec w/ arm rest push-off; (08/26/23) 52 sec Goal status: IN  PROGRESS   3.  Demo improved safety with ambulation on level surfaces w/ supervision x 150 ft Baseline: SBA-CGA;  occasional min A for balance and cueing to reduce freezing for 160 ft 06/04/23; 150 ft w/ supervision level surfaces x 2 min 45 sec; (08/26/23) CGA-SBA w/ RW Goal status: IN PROGRESS  4.  Improve gait speed to 1.5 ft/sec to improve efficiency of ambulation  Baseline: 0.9 ft/sec; 1.5 ft/sec (07/16/23); 0.7 ft/sec (08/26/23)  Goal status: IN PROGRESS  ASSESSMENT:  CLINICAL IMPRESSION: Review of POC details and performance of outcome measures with TUG test and 5xSTS relatively unchanged.  Pt and spouse endorse today being a less than ideal day due to recent fall and PD symptoms seem to be more active today with greater unsteadiness and retropulsion prevailing today.  Requires heavy cues for instruction and sequence in activities to coordinate and facilitate step initiation and weight shifting requiring CGA-min A for postural stability.  Review of strategies to help in freezing of gait such as mental clock imagery for visualizing foot placement on ground for negotiating 90-360 degree turns with good carryover using clock face directions.  Discussed best treatment options for PT and endorsed pt attending local fitness facility with spouse to ride exercise bike on a more consistent basis and discussed relevant RPM metrics for reference.  Demo use of YouTube for seated large amplitude exercises for those with PD.  Patient would benefit from ongoing PT sessions to reduce effects of disease progression in order to maintain level of ambulation and provide instruction for pt and caregiver in strategies for freezing of gait to maintain mobility and reduce risk for falls.   OBJECTIVE IMPAIRMENTS: Abnormal gait, decreased activity tolerance, decreased balance, decreased coordination, decreased mobility, difficulty walking, decreased ROM, decreased strength, improper body mechanics, and postural  dysfunction.   ACTIVITY LIMITATIONS: carrying, lifting, standing, stairs, transfers, bed mobility, and locomotion level  PARTICIPATION LIMITATIONS: meal prep, cleaning, laundry, interpersonal relationship, community activity, and exercise routine  PERSONAL FACTORS: Age, Time since onset of injury/illness/exacerbation, and 3+ comorbidities: PMH  are also affecting patient's functional outcome.   REHAB POTENTIAL: Good  CLINICAL DECISION MAKING: Evolving/moderate complexity  EVALUATION COMPLEXITY: Moderate  PLAN:  PT FREQUENCY: 1x/week  PT DURATION: 8 weeks  PLANNED INTERVENTIONS: Therapeutic exercises, Therapeutic activity, Neuromuscular re-education, Balance training, Gait training, Patient/Family education, Self Care, and Joint mobilization  PLAN FOR NEXT SESSION: freezing of gait, review gym routine/at home seated PWR moves compliance  2:21 PM, 08/26/23 M. Kelly Georgann Bramble, PT, DPT Physical Therapist- [place] Office Number: [phone]

## 2023-09-02 ENCOUNTER — Ambulatory Visit: Payer: Medicare Other | Admitting: Occupational Therapy

## 2023-09-02 ENCOUNTER — Ambulatory Visit: Payer: Medicare Other

## 2023-09-09 ENCOUNTER — Emergency Department (HOSPITAL_BASED_OUTPATIENT_CLINIC_OR_DEPARTMENT_OTHER): Payer: Medicare Other

## 2023-09-09 ENCOUNTER — Ambulatory Visit: Payer: Medicare Other | Attending: Neurology

## 2023-09-09 ENCOUNTER — Encounter (HOSPITAL_BASED_OUTPATIENT_CLINIC_OR_DEPARTMENT_OTHER): Payer: Self-pay | Admitting: Emergency Medicine

## 2023-09-09 ENCOUNTER — Ambulatory Visit: Payer: Medicare Other | Admitting: Occupational Therapy

## 2023-09-09 ENCOUNTER — Emergency Department (HOSPITAL_BASED_OUTPATIENT_CLINIC_OR_DEPARTMENT_OTHER)
Admission: EM | Admit: 2023-09-09 | Discharge: 2023-09-09 | Disposition: A | Discharge status: Home / Self Care | Payer: Medicare Other | Attending: Emergency Medicine | Admitting: Emergency Medicine

## 2023-09-09 DIAGNOSIS — S29001A Unspecified injury of muscle and tendon of front wall of thorax, initial encounter: Secondary | ICD-10-CM | POA: Diagnosis present

## 2023-09-09 DIAGNOSIS — R2681 Unsteadiness on feet: Secondary | ICD-10-CM

## 2023-09-09 DIAGNOSIS — R293 Abnormal posture: Secondary | ICD-10-CM | POA: Diagnosis not present

## 2023-09-09 DIAGNOSIS — M6281 Muscle weakness (generalized): Secondary | ICD-10-CM | POA: Diagnosis not present

## 2023-09-09 DIAGNOSIS — W01198A Fall on same level from slipping, tripping and stumbling with subsequent striking against other object, initial encounter: Secondary | ICD-10-CM | POA: Insufficient documentation

## 2023-09-09 DIAGNOSIS — R29818 Other symptoms and signs involving the nervous system: Secondary | ICD-10-CM | POA: Diagnosis not present

## 2023-09-09 DIAGNOSIS — Z043 Encounter for examination and observation following other accident: Secondary | ICD-10-CM | POA: Diagnosis not present

## 2023-09-09 DIAGNOSIS — Y9301 Activity, walking, marching and hiking: Secondary | ICD-10-CM | POA: Diagnosis not present

## 2023-09-09 DIAGNOSIS — Z7982 Long term (current) use of aspirin: Secondary | ICD-10-CM | POA: Insufficient documentation

## 2023-09-09 DIAGNOSIS — I700 Atherosclerosis of aorta: Secondary | ICD-10-CM | POA: Diagnosis not present

## 2023-09-09 DIAGNOSIS — R0781 Pleurodynia: Secondary | ICD-10-CM | POA: Diagnosis not present

## 2023-09-09 DIAGNOSIS — S2241XA Multiple fractures of ribs, right side, initial encounter for closed fracture: Secondary | ICD-10-CM | POA: Diagnosis not present

## 2023-09-09 DIAGNOSIS — R278 Other lack of coordination: Secondary | ICD-10-CM

## 2023-09-09 DIAGNOSIS — S2231XA Fracture of one rib, right side, initial encounter for closed fracture: Secondary | ICD-10-CM | POA: Diagnosis not present

## 2023-09-09 DIAGNOSIS — W19XXXA Unspecified fall, initial encounter: Secondary | ICD-10-CM

## 2023-09-09 DIAGNOSIS — J90 Pleural effusion, not elsewhere classified: Secondary | ICD-10-CM | POA: Diagnosis not present

## 2023-09-09 DIAGNOSIS — R262 Difficulty in walking, not elsewhere classified: Secondary | ICD-10-CM

## 2023-09-09 DIAGNOSIS — M160 Bilateral primary osteoarthritis of hip: Secondary | ICD-10-CM | POA: Diagnosis not present

## 2023-09-09 DIAGNOSIS — R2689 Other abnormalities of gait and mobility: Secondary | ICD-10-CM | POA: Diagnosis not present

## 2023-09-09 DIAGNOSIS — G20A2 Parkinson's disease without dyskinesia, with fluctuations: Secondary | ICD-10-CM

## 2023-09-09 DIAGNOSIS — Z471 Aftercare following joint replacement surgery: Secondary | ICD-10-CM | POA: Diagnosis not present

## 2023-09-09 DIAGNOSIS — K449 Diaphragmatic hernia without obstruction or gangrene: Secondary | ICD-10-CM | POA: Diagnosis not present

## 2023-09-09 DIAGNOSIS — G20C Parkinsonism, unspecified: Secondary | ICD-10-CM | POA: Insufficient documentation

## 2023-09-09 DIAGNOSIS — S0990XA Unspecified injury of head, initial encounter: Secondary | ICD-10-CM | POA: Diagnosis not present

## 2023-09-09 DIAGNOSIS — Z8669 Personal history of other diseases of the nervous system and sense organs: Secondary | ICD-10-CM

## 2023-09-09 MED ORDER — HYDROCODONE-ACETAMINOPHEN 5-325 MG PO TABS
1.0000 | ORAL_TABLET | ORAL | Status: AC
  Administered 2023-09-09: 1 via ORAL
  Filled 2023-09-09: qty 1

## 2023-09-09 MED ORDER — OXYCODONE HCL 5 MG PO TABS: 2.5000 mg | ORAL_TABLET | ORAL | 0 refills | Status: DC | PRN

## 2023-09-09 MED ORDER — LIDOCAINE 5 % EX PTCH
1.0000 | MEDICATED_PATCH | CUTANEOUS | Status: DC
  Administered 2023-09-09: 1 via TRANSDERMAL
  Filled 2023-09-09: qty 1

## 2023-09-09 NOTE — Discharge Instructions (Addendum)
 You were seen for your rib pain in the emergency department.  You broke your 10th rib.  At home, please take Tylenol  and use lidocaine  patches we have prescribed you for your pain. You may also take the oxycodone  we have prescribed you for any breakthrough pain that may have.  Do not take this before driving or operating heavy machinery.  Do not take this medication with alcohol . Please also use the incentive spirometer we have given you to prevent any pneumonias.   Check your MyChart online for the results of any tests that had not resulted by the time you left the emergency department.   Follow-up with your primary doctor in 2-3 days regarding your visit.    Return immediately to the emergency department if you experience any of the following: Difficulty breathing, fever, severe pain, or any other concerning symptoms.  Thank you for visiting our Emergency Department. It was a pleasure taking care of you today.

## 2023-09-09 NOTE — Therapy (Signed)
 OUTPATIENT OCCUPATIONAL THERAPY PARKINSON'S  Treatment Note  Patient Name: [name] MRN: [id number] DOB:[birth year], 88 y.o., male Today's Date: 09/09/2023  PCP: Yolande Toribio MATSU, MD REFERRING PROVIDER: Buck Saucer, MD     END OF SESSION:  OT End of Session - 09/09/23 1327     Visit Number 25    Number of Visits 32    Date for OT Re-Evaluation 10/24/23    Authorization Type Medicare A &B    OT Start Time 1320    OT Stop Time 1400    OT Time Calculation (min) 40 min    Equipment Utilized During Treatment gait belt    Activity Tolerance Patient tolerated treatment well    Behavior During Therapy WFL for tasks assessed/performed;Flat affect                                   Past Medical History:  Diagnosis Date   Ankylosing spondylitis (HCC) dx'd ~ 1974   Arthritis    back (08/10/2015)   BENIGN PROSTATIC HYPERTROPHY, WITH OBSTRUCTION 01/15/2010   Bleeding duodenal ulcer    Bleeding esophageal ulcer    Bleeding stomach ulcer    GERD 02/22/2008   History of blood transfusion 1988   lost ~ 1/2 of my blood volume from multiple bleeding ulcers   History of hiatal hernia    HYPERGLYCEMIA 11/18/2007   HYPERLIPIDEMIA 02/22/2008   HYPERTENSION, UNSPECIFIED 11/10/2009   Kidney stones    Osteoarthritis    c-spine   PEPTIC ULCER DISEASE 11/17/2007   Situational depression    son died in MVA 2015/07/02   TIA (transient ischemic attack)    not that I know of in the past; they are trying to determine if I've had one today (08/10/2015)   Past Surgical History:  Procedure Laterality Date   CATARACT EXTRACTION W/ INTRAOCULAR LENS  IMPLANT, BILATERAL Bilateral 2013   Patient Active Problem List   Diagnosis Date Noted   Ankylosing spondylitis of cervicothoracic region (HCC) 10/16/2016   TIA (transient ischemic attack) 08/10/2015   Carotid stenosis 08/04/2014   Hyperlipidemia 02/21/2012   BENIGN PROSTATIC HYPERTROPHY, WITH OBSTRUCTION 01/15/2010    Essential hypertension 11/10/2009   GERD 02/22/2008   NEPHROLITHIASIS, HX OF 11/17/2007    ONSET DATE: referral date 03/25/23  REFERRING DIAG: G20.A2 (ICD-10-CM) - Parkinson's disease without dyskinesia, with fluctuations R26.81 (ICD-10-CM) - Unsteadiness on feet R41.841 (ICD-10-CM) - Cognitive communication deficit R27.8 (ICD-10-CM) - Other lack of coordination R26.2 (ICD-10-CM) - Difficulty in walking, not elsewhere classified  THERAPY DIAG:  Unsteadiness on feet  Muscle weakness (generalized)  Other symptoms and signs involving the nervous system  Other lack of coordination  Abnormal posture  Rationale for Evaluation and Treatment: Rehabilitation  SUBJECTIVE:   SUBJECTIVE STATEMENT: Pt's spouse reports that pt had a bad fall yesterday where he was falling backwards in the bathroom.  Pt reports that his spouse got hurt on her L ankle as she was attempting to break his fall.    Pt's spouse reports that turns seems to precipitate the falls.  Pt accompanied by: self (spouse, [name])  PERTINENT HISTORY: history of Parkinson's and ankylosing spondylitis   PRECAUTIONS: Fall  WEIGHT BEARING RESTRICTIONS: No  PAIN:  Are you having pain? Yes off and on.  7/10 in right side, lower back.  FALLS: Has patient fallen in last 6 months? Yes. Number of falls has had multiple falls over the last few months  with multiple in early July, and then 2 falls this month.  07/07/23 - Pt's spouse reports that he has not had a fall since Weds. 07/09/23 - Pt reports fall between midnight and 3 AM last night when returning from using the bathroom.  LIVING ENVIRONMENT: Lives with: lives with their spouse Lives in: House/apartment Stairs: Yes: External: 2-3 steps; has a ramp at entrance with raised railings Internal: steps to basement and steps to loft - however pt does not need to go up/down those at this time. Has following equipment at home: [name] - 2 wheeled, Environmental Consultant - 4 wheeled, bed side  commode, Shower chair, Ramped entry, and transport chair  PLOF: Requires assistive device for independence and Needs assistance with ADLs  PATIENT GOALS: to be able to move without concern/serious concern  OBJECTIVE:   HAND DOMINANCE: Right  ADLs: Overall ADLs: Pt reports there are times that he can be fairly independent with dressing but there are times where spouse has to provide significant assistance with. Transfers/ambulation related to ADLs: Utilizing RW and will occasionally walk bouts through the house without RW.  If pt loses balance backwards he is unable to correct. Eating: fumble fingered UB Dressing: occasional assistance LB Dressing: socks and shoes are quite difficult Toileting: I go to the bathroom by myself but has difficulty getting up/down from toilet Bathing: washing at the sink, spouse will assist  Tub Shower transfers: pt feels uncomfortable in the shower with shower chair, but has not fallen Equipment: Shower seat with back and Walk in shower with 5.5 ledge with 30 door  IADLs: Light housekeeping: washes dishes when he is feeling strong, however frequent onset of weakness Meal Prep: Pt was primary cook prior to fall in 2023, but has returned to participation in aspects of meal prep Community mobility: recently renewed drivers license, but is not driving  MOBILITY STATUS: Needs Assist: CGA with mobility with RW and Hx of falls   POSTURE COMMENTS:  rounded shoulders and forward head, posterior pelvic tilt and R lean  FUNCTIONAL OUTCOME MEASURES: Fastening/unfastening 3 buttons: unable to button any buttons in 1 mins Physical performance test: PPT#2 (simulated eating) 22.79 sec & PPT#4 (donning/doffing jacket): 1:05  COORDINATION: Box and Blocks:  Right 30 blocks, Left 32 blocks  06/09/23: R: 39 blocks, L: 38 blocks  UE ROM:  all movements are slowed, decreased shoulder flexion d/t ankylosing spondylitis   UE MMT:    grossly 4/5  overall  COGNITION: Overall cognitive status:  Spouse reports thinking continues to be slower  OBSERVATIONS: Bradykinesia   TODAY'S TREATMENT:                                                                         DATE:  09/09/23 Sit > stand: engaged in massed practice with sit > stand with focus on anterior weight shift and hand placement to aid in transitional movements.  Pt continues to demonstrate posterior lean and freezing intermittently when transitioning hands from arm rests to RW.  OT providing min-mod assist for anterior weight shift.  Pt with 1 LOB backwards after freezing in standing, benefiting from mod assist to aid in lowering safety to sitting on mat table. Stand pivot transfers: engaged in massed practice with focus  on anterior weight shift and large steps when transferring.  Pt with mild improvements when transferring to R > to L.  OT providing mod verbal cues and up to min assist for stand pivot transfers due to posterior lean and freezing.  When pt utilizing larger steps, pt with improvements in ease with transfers. Large amplitude: engaged in modified LSVT big exercises to carry over to decreased burden of care with transitional movements and dressing. Pt still with good forward and downward reach when given targets to reach towards and verbal cues from therapist to further facilitate ROM. OT providing verbal cues and targets for upright posture when reaching up and out to side.  OT recommending attempting open reach in supine to further facilitate scapular retraction with assist of gravity.  Pt demonstrating waxing and waning amplitude with repetition.     08/26/23 Self-care: pt reports that aspects of dressing can vary.  Pt reports that he is generally able to get a shirt on with only supervision/setup but when in a time crunch that he has more difficulty. Pt sill requires assistance to get socks started and then he is able to pull them on the remainder of the way.  OT  re-educated on use of shoe funnel to aid in donning shoes.  Completed massed practice first with demonstration from therapist followed by attempts of pt to complete.  Pt continues to require mod-max cues for setup and sequencing with novel adaptation/equipment.  Pt's spouse still interested in use of shoe funnel with plan to purchase and continue to address. Large amplitude: engaged in modified LSVT big exercises to carry over to decreased burden of care with transitional movements and dressing. Pt demonstrating improved forward and downward reach when given targets to reach towards and verbal cues from therapist to further facilitate ROM. OT providing verbal cues and targets for upright posture when reaching up and out to side.  OT providing encouragement and use of mirror to facilitate increased upright posture and reach. Pt demonstrating waxing and waning amplitude with repetition.  Don/doff jacket: Trial 1: 1:03 and Trial 2: 1:08  Pt demonstrating good carryover of large amplitude technique when doffing jacket, however continues to have difficulty with sequencing and mobility when donning jacket.    08/18/23 Large amplitude: engaged in modified LSVT big exercises to carry over to decreased burden of care with transitional movements and dressing. Pt demonstrating improved forward and downward reach when given targets to reach towards. OT providing verbal cues for upright posture when reaching up and out to side.  OT providing encouragement and use of mirror to facilitate increased upright posture and reach. Pt demonstrating increased amplitude with repetition.  Bag exercises: engaged in simulated UB and LB dressing with use of scarf/bag with focus on large amplitude movements to carryover to dressing.  Pt demonstrating intermittent use of mirror when completing simulated UB dressing.  Pt with difficulty with LB dressing, sequencing forward weight shift and lifting leg therefore transitioned to stepping  pattern over hula hoop.  Pt with improved sequencing with focus on large amplitude picking up of leg and stepping over hula hoop (while in sitting). OT educated on use of hula hoop as option for simulated aspects of dressing from threading pant legs, pulling pants over hips, donning shirt, and even horizontal movements and circular movements for improved trunk mobility.   PATIENT EDUCATION: Education details: see treatment note above, massed practice with sit > stand and stand pivot transfers Person educated: Patient and Spouse Education method: Explanation and  Verbal cues Education comprehension: verbalized understanding and needs further education  HOME EXERCISE PROGRAM: 06/25/23 - Handouts: V/c for sit-to-stand (see 06/25/23 pt instructions), additional copy of 05/14/23 Bag Exercises (see 05/14/23 pt instructions)  LSVT big exercises  Access Code: [email or web address] URL: [email or web address] Date: 06/23/2023 Prepared by: [facility] - Outpatient  Rehab - [facility]  Exercises - Seated Figure 4 Piriformis Stretch  - 1 x daily - 10 reps - 15 sec hold - Seated Piriformis Stretch  - 1 x daily - 10 reps - 15 sec hold - Seated Single Knee to Chest  - 1 x daily - 10 reps - 5 sec hold  07/09/23 - Provided examples of no-tie lace options for LB dressing. (Handout, see pt instructions).   GOALS: Goals reviewed with patient? Yes  SHORT TERM GOALS: Target date: 09/26/23  Pt and spouse will be independent in full body HEP with focus on large amplitude movements as needed to maintain/preserve function as pertaining to ADLs/IADLs. Baseline: Goal status: IN PROGRESS  2.  Pt will demonstrate ability to complete sit > stand as needed to get up from toilet or other seating options in home by completing x3 throughout session without cues or physical assistance to maintain/preserve function. Baseline: occasionally requiring cues and/or physical assistance Goal status:  IN PROGRESS  3.  Pt and spouse will verbalize understanding of tips for fall prevention.  Baseline:  Goal status:  IN PROGRESS   LONG TERM GOALS: Target date: 10/24/23  Pt will report ability to complete UB/LB dressing at Supervision/setup level with use of AE and/or alternative strategies PRN.  Baseline: Min A Goal status: REVISED  2.  Pt and spouse will report understanding of adaptive techniques/strategies and/or modifications to routines to increase safety with mobility and self-care tasks in the home.  Baseline:  Goal status: IN PROGRESS  3.  Pt will demonstrate ability to maintain sequencing and timing with don/doff jacket to maintain in 1-1:10 minute range with use of adaptive strategies PRN to preserve function with UB dressing and don/doff jacket.  Baseline: 1:05 Goal status: REVISED  4.  Pt and spouse will be independent in updated PD specific HEP to maintain/preserve function as needed for ADLs and quality of life. Baseline: Goal status: REVISED  ASSESSMENT:  CLINICAL IMPRESSION: Pt demonstrating increased posterior lean with ambulation into treatment gym as well as with sit > stand and stand pivot transfers during session.  Pt continues to experience falls backwards, particularly when turning.  Pt continues to benefit from cues for amplitude and weight shifting to facilitate increased ease.  Pt easily distracted by excess cues and frequent freezing during transitional movements this session.  Pt limited by pain in lower back this session.  Pt's spouse reports that they have not made a lot of progress with community resources and support in the home.   PERFORMANCE DEFICITS: in functional skills including ADLs, IADLs, coordination, dexterity, ROM, strength, pain, flexibility, Fine motor control, Gross motor control, balance, body mechanics, endurance, decreased knowledge of precautions, decreased knowledge of use of DME, and UE functional use and psychosocial skills including coping strategies,  environmental adaptation, and routines and behaviors.   IMPAIRMENTS: are limiting patient from ADLs and IADLs.    PLAN:  OT FREQUENCY: 1x/week  OT DURATION: 8 weeks  PLANNED INTERVENTIONS: self care/ADL training, therapeutic exercise, therapeutic activity, neuromuscular re-education, balance training, functional mobility training, ultrasound, compression bandaging, moist heat, cryotherapy, patient/family education, cognitive remediation/compensation, psychosocial skills training, energy conservation, coping strategies training, and DME and/or AE  instructions  RECOMMENDED OTHER SERVICES: NA  CONSULTED AND AGREED WITH PLAN OF CARE: Patient  PLAN FOR NEXT SESSION:   Continue to review bag exercises for carryover to dressing Large amplitude and LSVT big exercises Sit > stand Education: Safety, reducing fall risk UB and LB dressing adaptive strategies    KAYLENE DOMINO, OTR/L 09/09/2023, 1:27 PM   [facility] at [facility] [address], [address] [place], [place] [address] Phone # [phone] Fax # [phone]

## 2023-09-09 NOTE — ED Provider Notes (Signed)
 [place] EMERGENCY DEPARTMENT AT [facility] Provider Note   CSN: [id number] Arrival date & time: 09/09/23  1733     History  Chief Complaint  Patient presents with   [name] is a 88 y.o. male.  88 year old male with history of Parkinson's disease who presents emergency department with rib pain and falls.  Over the past week has had 4 falls.  Last fall was today.  Says that typically what happens as he starts unintentionally walking backwards because of his Parkinson's.  Has hit his head.  His fall today he also hit his ribs and thinks he might of broken rib.  Went to urgent care who referred him to the emergency department for a CT of the ribs because they were unable to get a good-quality rib x-ray.  Says it is worse than when he breathes into the touch.  No other injuries as a result of the fall.       Home Medications Prior to Admission medications   Medication Sig Start Date End Date Taking? Authorizing Provider  oxyCODONE  (ROXICODONE ) 5 MG immediate release tablet Take 0.5 tablets (2.5 mg total) by mouth every 4 (four) hours as needed. 09/09/23  Yes Yolande Lamar BROCKS, MD  aspirin  81 MG tablet Take 81 mg by mouth daily.      [provider]  atorvastatin  (LIPITOR) 10 MG tablet TAKE 1 TABLET BY MOUTH DAILY Patient taking differently: Take 10 mg by mouth at bedtime. 03/04/23   Jordan, Peter M, MD  carbidopa -levodopa  (SINEMET  IR) 25-100 MG tablet Take 1 tablet by mouth 5 (five) times daily. Take at 6, 10, 2 PM, 6 PM, 9 PM daily. Patient taking differently: Take 1 tablet by mouth See admin instructions. Take 1 tablet by mouth at 6 AM, 10 AM, 2 PM, 6 PM, and 9 PM 07/03/22   Buck Saucer, MD  cholecalciferol  (VITAMIN D3) 25 MCG (1000 UNIT) tablet Take 1,000 Units by mouth daily. 04/13/19   [provider]  levothyroxine  (SYNTHROID ) 50 MCG tablet Take 50 mcg by mouth daily before breakfast. 01/23/21   [provider]  lidocaine  (LIDODERM )  5 % Place 1 patch onto the skin daily. Remove & Discard patch within 12 hours or as directed by MD 09/10/23   Theadore Ozell HERO, MD  losartan -hydrochlorothiazide  (HYZAAR) 50-12.5 MG tablet TAKE ONE-HALF TABLET BY MOUTH  DAILY 03/04/23   Jordan, Peter M, MD  Multiple Vitamin (MULTIVITAMIN WITH MINERALS) TABS tablet Take 1 tablet by mouth daily with breakfast.    [provider]  pantoprazole  (PROTONIX ) 20 MG tablet Take 20 mg by mouth at bedtime. 03/09/20   [provider]  PRESCRIPTION MEDICATION BiPAP- At bedtime    [provider]  Secukinumab  (COSENTYX  [place]) Inject into the skin every 30 (thirty) days.    [provider]  sulindac (CLINORIL) 150 MG tablet Take 150 mg by mouth at bedtime.    [provider]  SYSTANE COMPLETE PF 0.6 % SOLN Place 1 drop into both eyes 4 (four) times daily.    [provider]      Allergies    Patient has no known allergies.    Review of Systems   Review of Systems  Physical Exam Updated Vital Signs BP 134/68 (BP Location: Left Arm)   Pulse 92   Temp 97.9 F (36.6 C)   Resp 16   SpO2 100%  Physical Exam Constitutional:      General: He is not  in acute distress.    Appearance: Normal appearance. He is not ill-appearing.  HENT:     Head: Normocephalic and atraumatic.     Right Ear: External ear normal.     Left Ear: External ear normal.     Mouth/Throat:     Mouth: Mucous membranes are moist.     Pharynx: Oropharynx is clear.  Eyes:     Extraocular Movements: Extraocular movements intact.     Conjunctiva/sclera: Conjunctivae normal.     Pupils: Pupils are equal, round, and reactive to light.  Neck:     Comments: No C-spine midline tenderness to palpation Cardiovascular:     Rate and Rhythm: Normal rate and regular rhythm.     Pulses: Normal pulses.     Heart sounds: Normal heart sounds.  Pulmonary:     Effort: Pulmonary effort is normal. No respiratory distress.     Breath sounds: Normal breath  sounds.  Abdominal:     General: Abdomen is flat. There is no distension.     Palpations: Abdomen is soft. There is no mass.     Tenderness: There is no abdominal tenderness. There is no guarding.  Musculoskeletal:        General: No deformity. Normal range of motion.     Cervical back: No rigidity or tenderness.     Comments: No tenderness to palpation of midline thoracic or lumbar spine.  No step-offs palpated.  Tenderness to palpation of right chest wall.  No bruising noted.  No tenderness to palpation of bilateral clavicles.  No tenderness to palpation, bruising, or deformities noted of bilateral shoulders, elbows, wrists, hips, knees, or ankles.  Neurological:     Mental Status: He is alert and oriented to person, place, and time. Mental status is at baseline.     Sensory: No sensory deficit.     Motor: No weakness.     ED Results / Procedures / Treatments   Labs (all labs ordered are listed, but only abnormal results are displayed) Labs Reviewed - No data to display  EKG None  Radiology CT Chest Wo Contrast Result Date: 09/10/2023 CLINICAL DATA:  88 year old male with history of trauma from a fall with injury on a brick fireplace. Chest pain. EXAM: CT CHEST WITHOUT CONTRAST TECHNIQUE: Multidetector CT imaging of the chest was performed following the standard protocol without IV contrast. RADIATION DOSE REDUCTION: This exam was performed according to the departmental dose-optimization program which includes automated exposure control, adjustment of the mA and/or kV according to patient size and/or use of iterative reconstruction technique. COMPARISON:  Chest CT 09/09/2023. FINDINGS: Cardiovascular: Heart size is normal. There is no significant pericardial fluid, thickening or pericardial calcification. There is aortic atherosclerosis, as well as atherosclerosis of the great vessels of the mediastinum and the coronary arteries, including calcified atherosclerotic plaque in the left  main, left anterior descending, left circumflex and right coronary arteries. Calcifications of the mitral annulus. Mediastinum/Nodes: No pathologically enlarged mediastinal or hilar lymph nodes. Please note that accurate exclusion of hilar adenopathy is limited on noncontrast CT scans. Small hiatal hernia. No axillary lymphadenopathy. Lungs/Pleura: Small calcified granuloma in the superior segment of the right lower lobe incidentally noted. No other suspicious appearing pulmonary nodules or masses are noted. No acute consolidative airspace disease. Small right pleural effusion lying dependently. No left pleural effusion. No pneumothorax. Upper Abdomen: Aortic atherosclerosis. 1.2 cm low-attenuation lesion between segments 2 and 3 of the liver, incompletely characterized on today's noncontrast CT examination, but statistically likely a  cyst (no imaging follow-up recommended). Musculoskeletal: Acute nondisplaced posterolateral right ninth and tenth rib fractures again noted. Acute nondisplaced posterior left tenth rib fracture. Acute minimally displaced posterolateral left tenth rib fracture. Multiple other old healed bilateral rib fractures are incidentally noted. There are no aggressive appearing lytic or blastic lesions noted in the visualized portions of the skeleton. IMPRESSION: 1. Acute nondisplaced posterolateral right ninth and tenth rib fractures and acute nondisplaced posterior left tenth rib fracture. Associated with this is a small right-sided pleural effusion lying dependently. No pneumothorax. 2. Aortic atherosclerosis, in addition to left main and three-vessel coronary artery disease. 3. There are calcifications of the mitral annulus. Echocardiographic correlation for evaluation of potential valvular dysfunction may be warranted if clinically indicated. 4. Small hiatal hernia. Aortic Atherosclerosis (ICD10-I70.0). Electronically Signed   By: Toribio Aye M.D.   On: 09/10/2023 05:53   CT HEAD WO  CONTRAST ( ) Result Date: 09/10/2023 CLINICAL DATA:  Fall with head laceration EXAM: CT HEAD WITHOUT CONTRAST CT CERVICAL SPINE WITHOUT CONTRAST TECHNIQUE: Multidetector CT imaging of the head and cervical spine was performed following the standard protocol without intravenous contrast. Multiplanar CT image reconstructions of the cervical spine were also generated. RADIATION DOSE REDUCTION: This exam was performed according to the departmental dose-optimization program which includes automated exposure control, adjustment of the mA and/or kV according to patient size and/or use of iterative reconstruction technique. COMPARISON:  09/09/2023 FINDINGS: CT HEAD FINDINGS Brain: No evidence of acute infarction, hemorrhage, hydrocephalus, extra-axial collection or mass lesion/mass effect.Generalized atrophy. Vascular: No hyperdense vessel or unexpected calcification. Skull: Posterior scalp laceration with staples.  No acute fracture. Sinuses/Orbits: No evidence of injury CT CERVICAL SPINE FINDINGS Alignment: Exaggerated cervical curvature. Skull base and vertebrae: Generalized osteopenia. No acute fracture. Bridging upper thoracic osteophytes. Soft tissues and spinal canal: No prevertebral fluid or swelling. No visible canal hematoma. Disc levels: Ordinary degenerative endplate and facet spurring diffusely. Upper chest: No acute finding IMPRESSION: 1. No evidence of acute intracranial or cervical spine injury. 2. Posterior scalp laceration without fracture. Electronically Signed   By: Dorn Roulette M.D.   On: 09/10/2023 05:51   CT CERVICAL SPINE WO CONTRAST Result Date: 09/10/2023 CLINICAL DATA:  Fall with head laceration EXAM: CT HEAD WITHOUT CONTRAST CT CERVICAL SPINE WITHOUT CONTRAST TECHNIQUE: Multidetector CT imaging of the head and cervical spine was performed following the standard protocol without intravenous contrast. Multiplanar CT image reconstructions of the cervical spine were also generated. RADIATION  DOSE REDUCTION: This exam was performed according to the departmental dose-optimization program which includes automated exposure control, adjustment of the mA and/or kV according to patient size and/or use of iterative reconstruction technique. COMPARISON:  09/09/2023 FINDINGS: CT HEAD FINDINGS Brain: No evidence of acute infarction, hemorrhage, hydrocephalus, extra-axial collection or mass lesion/mass effect.Generalized atrophy. Vascular: No hyperdense vessel or unexpected calcification. Skull: Posterior scalp laceration with staples.  No acute fracture. Sinuses/Orbits: No evidence of injury CT CERVICAL SPINE FINDINGS Alignment: Exaggerated cervical curvature. Skull base and vertebrae: Generalized osteopenia. No acute fracture. Bridging upper thoracic osteophytes. Soft tissues and spinal canal: No prevertebral fluid or swelling. No visible canal hematoma. Disc levels: Ordinary degenerative endplate and facet spurring diffusely. Upper chest: No acute finding IMPRESSION: 1. No evidence of acute intracranial or cervical spine injury. 2. Posterior scalp laceration without fracture. Electronically Signed   By: Dorn Roulette M.D.   On: 09/10/2023 05:51   DG Pelvis Portable Result Date: 09/09/2023 CLINICAL DATA:  Status post fall. EXAM: PORTABLE PELVIS 1-2 VIEWS COMPARISON:  None Available. FINDINGS: There is no evidence of pelvic fracture or diastasis. No pelvic bone lesions are seen. Mild degenerative changes are seen involving both hips, in the form of joint space narrowing and acetabular sclerosis. IMPRESSION: Mild degenerative changes involving both hips. Electronically Signed   By: Suzen Dials M.D.   On: 09/09/2023 20:53   CT Chest Wo Contrast Result Date: 09/09/2023 CLINICAL DATA:  Status post fall. EXAM: CT CHEST WITHOUT CONTRAST TECHNIQUE: Multidetector CT imaging of the chest was performed following the standard protocol without IV contrast. RADIATION DOSE REDUCTION: This exam was performed  according to the departmental dose-optimization program which includes automated exposure control, adjustment of the mA and/or kV according to patient size and/or use of iterative reconstruction technique. COMPARISON:  March 03, 2023 FINDINGS: Cardiovascular: There is moderate to marked severity calcification of the thoracic aorta, without evidence of aortic aneurysm. Normal heart size with marked severity coronary artery calcification. No pericardial effusion. Mediastinum/Nodes: No enlarged mediastinal or axillary lymph nodes. Thyroid  gland, trachea, and esophagus demonstrate no significant findings. Lungs/Pleura: Mild atelectatic changes are seen within the posterior aspect of the bilateral lower lobes. There is a small right pleural effusion. No pneumothorax is identified. Upper Abdomen: There is a small hiatal hernia. Musculoskeletal: Chronic anterior third, fourth and fifth right rib fractures are seen. An acute posterolateral tenth right rib fracture is also noted (axial CT image 106, CT series 2). Multilevel degenerative changes are seen throughout the thoracic spine IMPRESSION: 1. Acute posterolateral tenth right rib fracture. 2. Chronic anterior third, fourth and fifth right rib fractures. 3. Small right pleural effusion. 4. Small hiatal hernia. 5. Aortic atherosclerosis. Aortic Atherosclerosis (ICD10-I70.0). Electronically Signed   By: Suzen Dials M.D.   On: 09/09/2023 20:51   CT Head Wo Contrast Result Date: 09/09/2023 CLINICAL DATA:  Fall with head strike EXAM: CT HEAD WITHOUT CONTRAST CT CERVICAL SPINE WITHOUT CONTRAST TECHNIQUE: Multidetector CT imaging of the head and cervical spine was performed following the standard protocol without intravenous contrast. Multiplanar CT image reconstructions of the cervical spine were also generated. RADIATION DOSE REDUCTION: This exam was performed according to the departmental dose-optimization program which includes automated exposure control, adjustment  of the mA and/or kV according to patient size and/or use of iterative reconstruction technique. COMPARISON:  04/24/2023 CT head and cervical spine FINDINGS: CT HEAD FINDINGS Brain: No evidence of acute infarct, hemorrhage, mass, mass effect, or midline shift. No hydrocephalus or extra-axial fluid collection. Vascular: No hyperdense vessel. Skull: Negative for fracture or focal lesion. Sinuses/Orbits: No acute finding. Status post bilateral lens replacements. Other: The mastoid air cells are well aerated. CT CERVICAL SPINE FINDINGS Alignment: No traumatic listhesis. Exaggeration of the normal cervical lordosis and thoracic kyphosis. Skull base and vertebrae: No acute fracture or suspicious osseous lesion. Soft tissues and spinal canal: No prevertebral fluid or swelling. No visible canal hematoma. Disc levels: Degenerative changes in the cervical spine.No high-grade spinal canal stenosis. Upper chest: No focal pulmonary opacity or pleural effusion. IMPRESSION: 1. No acute intracranial process. 2. No acute fracture or traumatic listhesis in the cervical spine. Electronically Signed   By: Donald Campion M.D.   On: 09/09/2023 19:07   CT Cervical Spine Wo Contrast Result Date: 09/09/2023 CLINICAL DATA:  Fall with head strike EXAM: CT HEAD WITHOUT CONTRAST CT CERVICAL SPINE WITHOUT CONTRAST TECHNIQUE: Multidetector CT imaging of the head and cervical spine was performed following the standard protocol without intravenous contrast. Multiplanar CT image reconstructions of the cervical spine were also generated.  RADIATION DOSE REDUCTION: This exam was performed according to the departmental dose-optimization program which includes automated exposure control, adjustment of the mA and/or kV according to patient size and/or use of iterative reconstruction technique. COMPARISON:  04/24/2023 CT head and cervical spine FINDINGS: CT HEAD FINDINGS Brain: No evidence of acute infarct, hemorrhage, mass, mass effect, or midline shift.  No hydrocephalus or extra-axial fluid collection. Vascular: No hyperdense vessel. Skull: Negative for fracture or focal lesion. Sinuses/Orbits: No acute finding. Status post bilateral lens replacements. Other: The mastoid air cells are well aerated. CT CERVICAL SPINE FINDINGS Alignment: No traumatic listhesis. Exaggeration of the normal cervical lordosis and thoracic kyphosis. Skull base and vertebrae: No acute fracture or suspicious osseous lesion. Soft tissues and spinal canal: No prevertebral fluid or swelling. No visible canal hematoma. Disc levels: Degenerative changes in the cervical spine.No high-grade spinal canal stenosis. Upper chest: No focal pulmonary opacity or pleural effusion. IMPRESSION: 1. No acute intracranial process. 2. No acute fracture or traumatic listhesis in the cervical spine. Electronically Signed   By: Donald Campion M.D.   On: 09/09/2023 19:07    Procedures Procedures    Medications Ordered in ED Medications  HYDROcodone -acetaminophen  (NORCO/VICODIN) 5-325 MG per tablet 1 tablet (1 tablet Oral Given 09/09/23 1916)    ED Course/ Medical Decision Making/ A&P                                 Medical Decision Making Amount and/or Complexity of Data Reviewed Radiology: ordered.  Risk Prescription drug management.   [name] is a 88 y.o. male with comorbidities that complicate the patient evaluation including Parkinson's disease who presents emergency department with rib pain and falls.   Initial Ddx:  Bruised rib, rib fracture, pneumonia, pneumothorax, TBI, concussion, C-spine injury  MDM:  The patient presents to the emergency department with rib pain after fall.  Did hit his head recently.  On exam does appear to be uncomfortable but is satting well on room air and does not have any external signs of trauma aside from his ribs on the right.  Apparently urgent care attempted x-ray unsuccessfully and referred him to the emergency department for CT scan so we  will go ahead and obtain that at this time.  Plan:  CT head, C-spine, chest Pelvic x-ray  lidocaine  patch P.o. pain medication  ED Summary/Re-evaluation:  Patient had CT scan which did show a right posterior lateral 10th rib fracture.  No pneumothorax or pneumonia.  CT head and C-spine without acute abnormality.  Pain under control here in the emergency department.  Given an incentive spirometer, pain medication, and instructed to follow-up with your primary doctor in several days.  Prior to discharge I did discuss with the patient and his wife whether or not we should make the patient a TOC border with his frequent falls.  He is already being followed by neurology since it seems like his Parkinson's that is what is predisposing him to these falls.  They already have physical therapy that they are following up with an outpatient and was felt that he may be little benefit from transfer for TOC border and PT reevaluation at this time.  This patient presents to the ED for concern of complaints listed in HPI, this involves an extensive number of treatment options, and is a complaint that carries with it a high risk of complications and morbidity. Disposition including potential need for admission considered.  Dispo: DC Home. Return precautions discussed including, but not limited to, those listed in the AVS. Allowed pt time to ask questions which were answered fully prior to dc.  Additional history obtained from spouse Records reviewed Outpatient Clinic Notes I independently reviewed the following imaging with scope of interpretation limited to determining acute life threatening conditions related to emergency care: CT Head and agree with the radiologist interpretation with the following exceptions: none I have reviewed the patients home medications and made adjustments as needed Social Determinants of health:  Elderly   Final Clinical Impression(s) / ED Diagnoses Final diagnoses:  Closed  fracture of one rib of right side, initial encounter  Fall, initial encounter  History of Parkinson's disease    Rx / DC Orders ED Discharge Orders          Ordered    oxyCODONE  (ROXICODONE ) 5 MG immediate release tablet  Every 4 hours PRN        09/09/23 2059              Yolande Lamar BROCKS, MD 09/10/23 1151

## 2023-09-09 NOTE — Therapy (Signed)
 OUTPATIENT PHYSICAL THERAPY NEURO TREATMENT NOTE Patient Name: [name] MRN: [id number] DOB:1935-11-26, 88 y.o., male Today's Date: 09/09/2023   PCP: Yolande Toribio MATSU REFERRING PROVIDER: Buck Saucer, MD  END OF SESSION:  PT End of Session - 09/09/23 1406     Visit Number 27    Number of Visits 34    Date for PT Re-Evaluation 10/21/23    Authorization Type Medicare/[employer]-NEEDS KX due to previous PT and speech this year    Progress Note Due on Visit 36    PT Start Time 1400    PT Stop Time 1445    PT Time Calculation (min) 45 min    Equipment Utilized During Treatment Gait belt    Activity Tolerance Patient tolerated treatment well    Behavior During Therapy WFL for tasks assessed/performed                           Past Medical History:  Diagnosis Date   Ankylosing spondylitis (HCC) dx'd ~ 1974   Arthritis    back (08/10/2015)   BENIGN PROSTATIC HYPERTROPHY, WITH OBSTRUCTION 01/15/2010   Bleeding duodenal ulcer    Bleeding esophageal ulcer    Bleeding stomach ulcer    GERD 02/22/2008   History of blood transfusion 1988   lost ~ 1/2 of my blood volume from multiple bleeding ulcers   History of hiatal hernia    HYPERGLYCEMIA 11/18/2007   HYPERLIPIDEMIA 02/22/2008   HYPERTENSION, UNSPECIFIED 11/10/2009   Kidney stones    Osteoarthritis    c-spine   PEPTIC ULCER DISEASE 11/17/2007   Situational depression    son died in MVA 06/07/2015   TIA (transient ischemic attack)    not that I know of in the past; they are trying to determine if I've had one today (08/10/2015)   Past Surgical History:  Procedure Laterality Date   CATARACT EXTRACTION W/ INTRAOCULAR LENS  IMPLANT, BILATERAL Bilateral 2013   Patient Active Problem List   Diagnosis Date Noted   Ankylosing spondylitis of cervicothoracic region (HCC) 10/16/2016   TIA (transient ischemic attack) 08/10/2015   Carotid stenosis 08/04/2014   Hyperlipidemia 02/21/2012   BENIGN PROSTATIC  HYPERTROPHY, WITH OBSTRUCTION 01/15/2010   Essential hypertension 11/10/2009   GERD 02/22/2008   NEPHROLITHIASIS, HX OF 11/17/2007    ONSET DATE: several years  REFERRING DIAG: G20.A2 (ICD-10-CM) - Parkinson's disease with fluctuating manifestations, unspecified whether dyskinesia present R26.81 (ICD-10-CM) - Unsteadiness on feet R41.841 (ICD-10-CM) - Cognitive communication deficit R27.8 (ICD-10-CM) - Other lack of coordination R26.2 (ICD-10-CM) - Difficulty in walking, not elsewhere classified  THERAPY DIAG:  Unsteadiness on feet  Muscle weakness (generalized)  Other symptoms and signs involving the nervous system  Abnormal posture  Parkinson's disease with fluctuating manifestations, unspecified whether dyskinesia present (HCC)  Difficulty in walking, not elsewhere classified  Other abnormalities of gait and mobility  Rationale for Evaluation and Treatment: Rehabilitation  SUBJECTIVE:  SUBJECTIVE STATEMENT: Continues to experience falls multiple times/week  Pt accompanied by: significant other  PERTINENT HISTORY: PD, complex medical history of peptic ulcer disease with history of upper GI bleed, TIA, depression, degenerative disc disease in the neck, osteoarthritis, kidney stones, hyperlipidemia, hypertension, history of hiatal hernia, BPH, arthritis, and ankylosing spondylitis, and parkinsonism   PAIN:  Are you having pain? Various places  mid back mostly  PRECAUTIONS: Fall  RED FLAGS: None   WEIGHT BEARING RESTRICTIONS: No  FALLS: Has patient fallen in last 6 months? Yes. Number of falls unknown, multiple  LIVING ENVIRONMENT: Lives with: lives with their spouse Lives in: House/apartment Stairs: Ramp to enter, stairs to loft and basement, ground floor set-up Has following  equipment at home: [name] - 2 wheeled  PLOF: Needs assistance with ADLs, Needs assistance with homemaking, and Needs assistance with gait, supervision for ambulation at household distances  PATIENT GOALS: improve balance, walking, reduce falls  OBJECTIVE:   TODAY'S TREATMENT: 09/09/23 Activity Comments  Heavy work for upper body strength/coordination Rope pull x 2 min  Gait training -trial of pushing w/c against resistance--not effective causing increased step-to pattern -techniques for large step length using visual tiles for step placement -carryover of large step to use of RW x 85 ft min A and 75 ft min A                 TODAY'S TREATMENT: 08/26/23 Activity Comments  TUG test 2 min  5xSTS Trial 1) 1 min 15 sec Trial 2) 52 sec  Gait w/ RW and CGA   Gait speed 44.5 sec = 0.7 ft/sec  Freezing of gait strategies            PATIENT EDUCATION: Education details: Cues for take as few steps as possible to encourage long, wide steps; cues to stop and reset with stagger stance, then reset with the same cues above Person educated: Patient and Spouse Education method: Explanation, Demonstration, and Verbal cues Education comprehension: verbalized understanding and needs further education   Access Code: [email or web address] URL: [email or web address] Date: 05/08/2023 Prepared by: [facility] - Outpatient  Rehab - [facility]  Program Notes perform with wife's supervision  Exercises - Mini Squat with Counter Support  - 1 x daily - 5 x weekly - 2 sets - 10 reps - Staggered Stance Forward Backward Weight Shift with Counter Support  - 1 x daily - 5 x weekly - 2 sets - 10 reps - Standing Quarter Turn with Counter Support  - 1 x daily - 5 x weekly - 2 sets - 5 reps - Seated Long Arc Quad with Ankle Weight  - 1 x daily - 5 x weekly - 3 sets - 10 reps - Seated Hip Flexion March with Ankle Weights  - 1 x daily - 5 x weekly - 3 sets - 10 reps - Seated Active Hip Flexion  - 1 x  daily - 7 x weekly - 1 sets - 10 reps - 5-10 sec hold        ------------------------------------------------- Objective measures below taken at initial evaluation:  DIAGNOSTIC FINDINGS: n/a for episode  COGNITION: Overall cognitive status: History of cognitive impairments - at baseline   SENSATION: Neuropathy in bilat feet  COORDINATION: Difficulty with rapid alternating movements    MUSCLE TONE: NT   POSTURE: rounded shoulders, forward head, flexed trunk , and head down , kyphosis  LOWER EXTREMITY ROM:     Active  Right Eval Left Eval  Hip flexion  Hip extension    Hip abduction    Hip adduction    Hip internal rotation    Hip external rotation    Knee flexion 120 120  Knee extension 0 0  Ankle dorsiflexion 5 12  Ankle plantarflexion    Ankle inversion    Ankle eversion     (Blank rows = not tested)  LOWER EXTREMITY MMT:    BLE strength grossly 3+/5  BED MOBILITY:  Mod A  TRANSFERS: Assistive device utilized: Environmental Consultant - 2 wheeled  Sit to stand: CGA and Min A Stand to sit: SBA Chair to chair: SBA and CGA Floor: Mod A and Max A  RAMP:  Level of Assistance: CGA Assistive device utilized: Environmental Consultant - 2 wheeled Ramp Comments:   CURB:  Level of Assistance: CGA Assistive device utilized: Environmental Consultant - 2 wheeled Curb Comments:   STAIRS: NT  GAIT: Gait pattern: step to pattern, decreased stride length, and poor foot clearance- Right Distance walked: 100 Assistive device utilized: Walker - 2 wheeled Level of assistance: SBA and CGA Comments: level surfaces  FUNCTIONAL TESTS:  5 times sit to stand: 45 sec w/ CGA Timed up and go (TUG): 1 min 27 sec  M-CTSIB  Condition 1: Firm Surface, EO 10 Sec, Severe and retro-LOB  Sway  Condition 2: Firm Surface, EC 3 Sec, Severe and retro-LOB  Sway  Condition 3: Foam Surface, EO  Sec,  Sway  Condition 4: Foam Surface, EC  Sec,  Sway       GOALS: Goals reviewed with patient? Yes  SHORT TERM GOALS:  UPDATED Target date: 05/15/2023>09/23/2023      Patient will be independent in HEP to improve functional outcomes Baseline: has been reviewed with PT and wife previously 06/04/23 Goal status: MET 06/04/23  2.  Teach-back strategies to reduce freezing of gait during turns Baseline: 16 steps to complete 180 deg turn; unable to recall 06/04/23 Goal status: NOT MET 06/04/23  3.  Demo improved safety and independence with sit to stand completing transfers with set-up assist Baseline: CGA-min A; unchanged 06/04/23 Goal status: NOT MET 06/04/23  4.  Demo ability to perform 10 backwards steps without LOB/retropulsion to improve motor control and facilitate righting reactions  Baseline: 3-5 steps before retropulsion/LOB  Goal status: INITIAL    LONG TERM GOALS: UPDATED Target date: 06/05/2023>06/20/2023> 07/17/2023> 10/21/2023        Demo reduced risk for falls per time 35 sec TUG test Baseline: 1 min 27 sec; 1 min 15 sec 06/04/23; 54 sec 06/19/23; 38 sec (07/16/23); (08/26/23) 2 min Goal status: IN PROGRESS   2.  Demo improved BLE strength and balance per time of 25 sec 5xSTS test to increase independence with mobility Baseline: 45 sec; 1 min 33 sec with occasional min A and consistent verbal cues 06/04/23; 1 min 18 sec 06/19/23; (07/16/23) 41 sec w/ arm rest push-off; (08/26/23) 52 sec Goal status: IN PROGRESS   3.  Demo improved safety with ambulation on level surfaces w/ supervision x 150 ft Baseline: SBA-CGA; occasional min A for balance and cueing to reduce freezing for 160 ft 06/04/23; 150 ft w/ supervision level surfaces x 2 min 45 sec; (08/26/23) CGA-SBA w/ RW Goal status: IN PROGRESS  4.  Improve gait speed to 1.5 ft/sec to improve efficiency of ambulation  Baseline: 0.9 ft/sec; 1.5 ft/sec (07/16/23); 0.7 ft/sec (08/26/23)  Goal status: IN PROGRESS  ASSESSMENT:  CLINICAL IMPRESSION: Pt reports worsening balance and postural control as of late with increased frequency of  falls at  home reported.  Initiated today's session with seated heavy work/gross motor coordination followed by gait training to improve step length and single limb support to improve safety and efficiency of ambulation.  Difficulty with sequence using visual targets on ground for step length and trials of carryover to RW with use of visual aid across lower part of RW for cue for step length/advancement but at the expense of balance and stability.  Demonstrates tendency for retropulsion today requiring min A throughout for transfers and ambulation with worsening freezing of gait w/ turns despite tactile, visual, verbal cues.  Discussion with spouse regarding home equipment for fall recovery. Continued sessions to refine HEP and additional caregiver education to promote improved balance, motor control, and safety with mobility  OBJECTIVE IMPAIRMENTS: Abnormal gait, decreased activity tolerance, decreased balance, decreased coordination, decreased mobility, difficulty walking, decreased ROM, decreased strength, improper body mechanics, and postural dysfunction.   ACTIVITY LIMITATIONS: carrying, lifting, standing, stairs, transfers, bed mobility, and locomotion level  PARTICIPATION LIMITATIONS: meal prep, cleaning, laundry, interpersonal relationship, community activity, and exercise routine  PERSONAL FACTORS: Age, Time since onset of injury/illness/exacerbation, and 3+ comorbidities: PMH  are also affecting patient's functional outcome.   REHAB POTENTIAL: Good  CLINICAL DECISION MAKING: Evolving/moderate complexity  EVALUATION COMPLEXITY: Moderate  PLAN:  PT FREQUENCY: 1x/week  PT DURATION: 8 weeks  PLANNED INTERVENTIONS: Therapeutic exercises, Therapeutic activity, Neuromuscular re-education, Balance training, Gait training, Patient/Family education, Self Care, and Joint mobilization  PLAN FOR NEXT SESSION: freezing of gait, review gym routine/at home seated PWR moves compliance  2:07 PM, 09/09/23 M.  Kelly Mailey Landstrom, PT, DPT Physical Therapist- [place] Office Number: [phone]

## 2023-09-09 NOTE — ED Triage Notes (Signed)
 Fall in bathroom last night. Frequent fall Asa daily Larey Seat back ward hit head onto wall Headache, right arm, right rib pain.

## 2023-09-09 NOTE — ED Notes (Signed)
 Patient provided with incentive spirometer. RT explained indication/use to patient and family member at bedside. Patient able to perform IS with minimal assistance. Instructed patient/family to use IS q1h WA, family acknowledges understanding.

## 2023-09-10 ENCOUNTER — Other Ambulatory Visit: Payer: Self-pay

## 2023-09-10 ENCOUNTER — Emergency Department (HOSPITAL_BASED_OUTPATIENT_CLINIC_OR_DEPARTMENT_OTHER): Payer: Medicare Other

## 2023-09-10 ENCOUNTER — Emergency Department (HOSPITAL_BASED_OUTPATIENT_CLINIC_OR_DEPARTMENT_OTHER)
Admission: EM | Admit: 2023-09-10 | Discharge: 2023-09-10 | Disposition: A | Discharge status: Home / Self Care | Payer: Medicare Other | Attending: Emergency Medicine | Admitting: Emergency Medicine

## 2023-09-10 ENCOUNTER — Encounter (HOSPITAL_BASED_OUTPATIENT_CLINIC_OR_DEPARTMENT_OTHER): Payer: Self-pay | Admitting: Emergency Medicine

## 2023-09-10 DIAGNOSIS — Z87891 Personal history of nicotine dependence: Secondary | ICD-10-CM | POA: Insufficient documentation

## 2023-09-10 DIAGNOSIS — I10 Essential (primary) hypertension: Secondary | ICD-10-CM | POA: Diagnosis not present

## 2023-09-10 DIAGNOSIS — W19XXXA Unspecified fall, initial encounter: Secondary | ICD-10-CM

## 2023-09-10 DIAGNOSIS — S0101XA Laceration without foreign body of scalp, initial encounter: Secondary | ICD-10-CM | POA: Insufficient documentation

## 2023-09-10 DIAGNOSIS — M542 Cervicalgia: Secondary | ICD-10-CM | POA: Diagnosis not present

## 2023-09-10 DIAGNOSIS — R079 Chest pain, unspecified: Secondary | ICD-10-CM | POA: Diagnosis not present

## 2023-09-10 DIAGNOSIS — Y92009 Unspecified place in unspecified non-institutional (private) residence as the place of occurrence of the external cause: Secondary | ICD-10-CM | POA: Diagnosis not present

## 2023-09-10 DIAGNOSIS — W01198A Fall on same level from slipping, tripping and stumbling with subsequent striking against other object, initial encounter: Secondary | ICD-10-CM | POA: Insufficient documentation

## 2023-09-10 DIAGNOSIS — Z043 Encounter for examination and observation following other accident: Secondary | ICD-10-CM | POA: Diagnosis not present

## 2023-09-10 DIAGNOSIS — R58 Hemorrhage, not elsewhere classified: Secondary | ICD-10-CM | POA: Diagnosis not present

## 2023-09-10 DIAGNOSIS — S2241XA Multiple fractures of ribs, right side, initial encounter for closed fracture: Secondary | ICD-10-CM | POA: Diagnosis not present

## 2023-09-10 DIAGNOSIS — G20A1 Parkinson's disease without dyskinesia, without mention of fluctuations: Secondary | ICD-10-CM | POA: Insufficient documentation

## 2023-09-10 DIAGNOSIS — I700 Atherosclerosis of aorta: Secondary | ICD-10-CM | POA: Diagnosis not present

## 2023-09-10 DIAGNOSIS — S0990XA Unspecified injury of head, initial encounter: Secondary | ICD-10-CM | POA: Diagnosis not present

## 2023-09-10 DIAGNOSIS — G319 Degenerative disease of nervous system, unspecified: Secondary | ICD-10-CM | POA: Diagnosis not present

## 2023-09-10 DIAGNOSIS — S2243XA Multiple fractures of ribs, bilateral, initial encounter for closed fracture: Secondary | ICD-10-CM | POA: Diagnosis not present

## 2023-09-10 DIAGNOSIS — S299XXA Unspecified injury of thorax, initial encounter: Secondary | ICD-10-CM | POA: Diagnosis present

## 2023-09-10 DIAGNOSIS — T148XXA Other injury of unspecified body region, initial encounter: Secondary | ICD-10-CM | POA: Diagnosis not present

## 2023-09-10 HISTORY — DX: Parkinson's disease without dyskinesia, without mention of fluctuations: G20.A1

## 2023-09-10 MED ORDER — LIDOCAINE HCL (PF) 1 % IJ SOLN
10.0000 mL | Freq: Once | INTRAMUSCULAR | Status: AC
  Administered 2023-09-10: 10 mL
  Filled 2023-09-10: qty 10

## 2023-09-10 MED ORDER — LIDOCAINE 5 % EX PTCH: 1.0000 | MEDICATED_PATCH | CUTANEOUS | 0 refills | Status: DC

## 2023-09-10 NOTE — ED Triage Notes (Signed)
 Patient presents via EMS from home s/p fall. Reports striking head on "brick fireplace". Denies loc. Laceration noted to back of head. Pressure dressing in place per EMS.  Hx Parkinsons and frequent falls

## 2023-09-10 NOTE — Discharge Instructions (Signed)
 You were evaluated in the Emergency Department and after careful evaluation, we did not find any emergent condition requiring admission or further testing in the hospital.  Your exam/testing today is overall reassuring.  Importantly you take deep breaths during the day to help prevent pneumonia or other complications.  Use the incentive spirometer device.  Use Tylenol , Motrin, numbing patches as we discussed.  Reserve the earlier prescribed oxycodone  for more significant pain keeping you from sleeping at night.  Your laceration was repaired here in the emergency department with staples which will need to be removed in 10 to 14 days by healthcare professional.  Please return to the Emergency Department if you experience any worsening of your condition.   Thank you for allowing us  to be a part of your care.

## 2023-09-10 NOTE — ED Provider Notes (Signed)
 [facility] Emergency Department Provider Note MRN:  [id number]  Arrival date & time: 09/10/23     Chief Complaint   Fall   History of Present Illness   [name] is a 88 y.o. year-old male with a history of Parkinson's disease presenting to the ED with chief complaint of fall.  Patient recently discharged from the emergency department, was home for an hour and then had another fall hitting his head.  Had another recent fall for which she sustained a rib fracture, this area of his ribs/chest hurts more from the most recent fall, also complaining of neck pain.  Denies back pain, no arm or leg injuries, no abdominal pain, no shortness of breath.  Review of Systems  A thorough review of systems was obtained and all systems are negative except as noted in the HPI and PMH.   Patient's Health History    Past Medical History:  Diagnosis Date   Ankylosing spondylitis (HCC) dx'd ~ 1974   Arthritis    "back" (08/10/2015)   BENIGN PROSTATIC HYPERTROPHY, WITH OBSTRUCTION 01/15/2010   Bleeding duodenal ulcer    Bleeding esophageal ulcer    Bleeding stomach ulcer    GERD 02/22/2008   History of blood transfusion 08/26/1986   "lost ~ 1/2 of my blood volume from multiple bleeding ulcers"   History of hiatal hernia    HYPERGLYCEMIA 11/18/2007   HYPERLIPIDEMIA 02/22/2008   HYPERTENSION, UNSPECIFIED 11/10/2009   Kidney stones    Osteoarthritis    c-spine   Parkinson's disease (HCC)    PEPTIC ULCER DISEASE 11/17/2007   Situational depression    "son died in MVA 01-Jul-2015"   TIA (transient ischemic attack)    "not that I know of in the past; they are trying to determine if I've had one today (08/10/2015)"    Past Surgical History:  Procedure Laterality Date   CATARACT EXTRACTION W/ INTRAOCULAR LENS  IMPLANT, BILATERAL Bilateral 2013    Family History  Problem Relation Age of Onset   Appendicitis Mother        complications   Colon cancer Father    Diabetes  type II Father    Lupus Sister    Colon cancer Other        1st degree relative <60   Parkinson's disease Neg Hx     Social History   Socioeconomic History   Marital status: Married    Spouse name: Not on file   Number of children: Not on file   Years of education: Not on file   Highest education level: Not on file  Occupational History   Not on file  Tobacco Use   Smoking status: Former    Types: Cigars, Pipe    Quit date: 08/26/1978    Years since quitting: 45.0   Smokeless tobacco: Never  Vaping Use   Vaping status: Never Used  Substance and Sexual Activity   Alcohol  use: Yes    Comment: 08/10/2015 "glass of wine ~ month or so; occasionally a can of beer"   Drug use: No   Sexual activity: Yes  Other Topics Concern   Not on file  Social History Narrative   Retired and married.    Social Drivers of Corporate investment banker Strain: Not on file  Food Insecurity: No Food Insecurity (05/30/2023)   Hunger Vital Sign    Worried About Running Out of Food in the Last Year: Never true    Ran Out of Food in  the Last Year: Never true  Transportation Needs: No Transportation Needs (05/30/2023)   PRAPARE - Administrator, Civil Service (Medical): No    Lack of Transportation (Non-Medical): No  Physical Activity: Not on file  Stress: Not on file  Social Connections: Not on file  Intimate Partner Violence: Not on file     Physical Exam   Vitals:   09/10/23 0430 09/10/23 0432  BP:  (!) 171/70  Pulse:  (!) 103  Resp:  (!) 22  Temp:  98.5 F (36.9 C)  SpO2: 99% 98%    CONSTITUTIONAL: Well-appearing, NAD NEURO/PSYCH:  Alert and oriented x 3, no focal deficits EYES:  eyes equal and reactive ENT/NECK:  no LAD, no JVD CARDIO: Regular rate, well-perfused, normal S1 and S2 PULM:  CTAB no wheezing or rhonchi GI/GU:  non-distended, non-tender MSK/SPINE:  No gross deformities, no edema SKIN: Linear laceration to the occiput   *Additional and/or pertinent  findings included in MDM below  Diagnostic and Interventional Summary    EKG Interpretation Date/Time:    Ventricular Rate:    PR Interval:    QRS Duration:    QT Interval:    QTC Calculation:   R Axis:      Text Interpretation:         Labs Reviewed - No data to display  CT HEAD WO CONTRAST ( )  Final Result    CT CERVICAL SPINE WO CONTRAST  Final Result    CT Chest Wo Contrast  Final Result      Medications  lidocaine  (PF) (XYLOCAINE ) 1 % injection 10 mL (10 mLs Infiltration Given 09/10/23 0525)     Procedures  /  Critical Care .Laceration Repair  Date/Time: 09/10/2023 5:30 AM  Performed by: Edson Graces, MD Authorized by: Edson Graces, MD   Consent:    Consent obtained:  Verbal   Risks, benefits, and alternatives were discussed: yes     Risks discussed:  Infection, need for additional repair, nerve damage, poor wound healing, poor cosmetic result, pain, retained foreign body, tendon damage and vascular damage   Alternatives discussed:  No treatment Universal protocol:    Procedure explained and questions answered to patient or proxy's satisfaction: yes     Immediately prior to procedure, a time out was called: yes     Patient identity confirmed:  Verbally with patient Anesthesia:    Anesthesia method:  Local infiltration   Local anesthetic:  Lidocaine  1% w/o epi Laceration details:    Location:  Scalp   Scalp location:  Occipital   Length (cm):  8   Depth (mm):  4 Pre-procedure details:    Preparation:  Patient was prepped and draped in usual sterile fashion Exploration:    Hemostasis achieved with:  Direct pressure   Wound exploration: wound explored through full range of motion and entire depth of wound visualized     Contaminated: no   Treatment:    Area cleansed with:  Saline   Amount of cleaning:  Standard Skin repair:    Repair method:  Staples   Number of staples:  8 Approximation:    Approximation:  Close Repair type:    Repair  type:  Simple Post-procedure details:    Dressing:  Open (no dressing)   Procedure completion:  Tolerated well, no immediate complications   ED Course and Medical Decision Making  Initial Impression and Ddx Recurrent mechanical falls with head trauma, laceration, increased neck and chest pain.  Will need  repeat imaging.  Past medical/surgical history that increases complexity of ED encounter: Parkinson's  Interpretation of Diagnostics CT imaging reveals 3 acute rib fractures, unclear if they are from the most recent fall or the prior fall earlier today.  CT head and cervical spine largely unremarkable.  Patient Reassessment and Ultimate Disposition/Management     Laceration repaired as described above, patient is on room air sitting comfortably no acute distress, minimal pain at this time.  Was sent home with an incentive spirometer earlier today, encouraged to continue this, strongly encouraged caution with the oxycodone , must avoid any further falls, appropriate for discharge with strict return precautions.  Patient management required discussion with the following services or consulting groups:  None  Complexity of Problems Addressed Acute illness or injury that poses threat of life of bodily function  Additional Data Reviewed and Analyzed Further history obtained from: Further history from spouse/family member  Additional Factors Impacting ED Encounter Risk Prescriptions and Consideration of hospitalization  Merrick Abe. Harless Lien, MD [facility] Emergency Medicine [facility] [email or web address]  Final Clinical Impressions(s) / ED Diagnoses     ICD-10-CM   1. Fall, initial encounter  W19.XXXA     2. Traumatic injury of head, initial encounter  S09.90XA     3. Laceration of scalp, initial encounter  S01.01XA     4. Closed fracture of multiple ribs of both sides, initial encounter  S22.43XA       ED Discharge Orders          Ordered    lidocaine   (LIDODERM ) 5 %  Every 24 hours        09/10/23 0635             Discharge Instructions Discussed with and Provided to Patient:     Discharge Instructions      You were evaluated in the Emergency Department and after careful evaluation, we did not find any emergent condition requiring admission or further testing in the hospital.  Your exam/testing today is overall reassuring.  Importantly you take deep breaths during the day to help prevent pneumonia or other complications.  Use the incentive spirometer device.  Use Tylenol , Motrin, numbing patches as we discussed.  Reserve the earlier prescribed oxycodone  for more significant pain keeping you from sleeping at night.  Your laceration was repaired here in the emergency department with staples which will need to be removed in 10 to 14 days by healthcare professional.  Please return to the Emergency Department if you experience any worsening of your condition.   Thank you for allowing us  to be a part of your care.       Edson Graces, MD 09/10/23 [phone]

## 2023-09-12 DIAGNOSIS — R109 Unspecified abdominal pain: Secondary | ICD-10-CM | POA: Diagnosis not present

## 2023-09-12 DIAGNOSIS — R4182 Altered mental status, unspecified: Secondary | ICD-10-CM | POA: Diagnosis not present

## 2023-09-16 DIAGNOSIS — M455 Ankylosing spondylitis of thoracolumbar region: Secondary | ICD-10-CM | POA: Diagnosis not present

## 2023-09-16 DIAGNOSIS — S0101XA Laceration without foreign body of scalp, initial encounter: Secondary | ICD-10-CM | POA: Diagnosis not present

## 2023-09-16 DIAGNOSIS — S0990XA Unspecified injury of head, initial encounter: Secondary | ICD-10-CM | POA: Diagnosis not present

## 2023-09-16 DIAGNOSIS — G20C Parkinsonism, unspecified: Secondary | ICD-10-CM | POA: Diagnosis not present

## 2023-09-16 DIAGNOSIS — S2243XA Multiple fractures of ribs, bilateral, initial encounter for closed fracture: Secondary | ICD-10-CM | POA: Diagnosis not present

## 2023-09-16 DIAGNOSIS — R269 Unspecified abnormalities of gait and mobility: Secondary | ICD-10-CM | POA: Diagnosis not present

## 2023-09-16 DIAGNOSIS — W19XXXA Unspecified fall, initial encounter: Secondary | ICD-10-CM | POA: Diagnosis not present

## 2023-09-16 DIAGNOSIS — Z4802 Encounter for removal of sutures: Secondary | ICD-10-CM | POA: Diagnosis not present

## 2023-09-16 DIAGNOSIS — N400 Enlarged prostate without lower urinary tract symptoms: Secondary | ICD-10-CM | POA: Diagnosis not present

## 2023-09-16 DIAGNOSIS — R300 Dysuria: Secondary | ICD-10-CM | POA: Diagnosis not present

## 2023-09-17 DIAGNOSIS — M454 Ankylosing spondylitis of thoracic region: Secondary | ICD-10-CM | POA: Diagnosis not present

## 2023-09-19 ENCOUNTER — Encounter: Payer: Self-pay | Admitting: Physical Therapy

## 2023-09-19 ENCOUNTER — Ambulatory Visit: Payer: Medicare Other | Admitting: Occupational Therapy

## 2023-09-19 ENCOUNTER — Ambulatory Visit: Payer: Medicare Other | Admitting: Physical Therapy

## 2023-09-19 DIAGNOSIS — R293 Abnormal posture: Secondary | ICD-10-CM

## 2023-09-19 DIAGNOSIS — M6281 Muscle weakness (generalized): Secondary | ICD-10-CM | POA: Diagnosis not present

## 2023-09-19 DIAGNOSIS — R29818 Other symptoms and signs involving the nervous system: Secondary | ICD-10-CM

## 2023-09-19 DIAGNOSIS — R2681 Unsteadiness on feet: Secondary | ICD-10-CM

## 2023-09-19 DIAGNOSIS — R262 Difficulty in walking, not elsewhere classified: Secondary | ICD-10-CM | POA: Diagnosis not present

## 2023-09-19 DIAGNOSIS — R2689 Other abnormalities of gait and mobility: Secondary | ICD-10-CM

## 2023-09-19 DIAGNOSIS — G20A2 Parkinson's disease without dyskinesia, with fluctuations: Secondary | ICD-10-CM | POA: Diagnosis not present

## 2023-09-19 NOTE — Therapy (Signed)
OUTPATIENT PHYSICAL THERAPY NEURO TREATMENT NOTE Patient Name: [name] MRN: [id number] DOB:31-Jul-1936, 88 y.o., male Today's Date: 09/19/2023   PCP: Garlan Fillers REFERRING PROVIDER: Huston Foley, MD  END OF SESSION:  PT End of Session - 09/19/23 1110     Visit Number 28    Number of Visits 34    Date for PT Re-Evaluation 10/21/23    Authorization Type Medicare/[employer]    Progress Note Due on Visit 36    PT Start Time 1106    PT Stop Time 1150    PT Time Calculation (min) 44 min    Equipment Utilized During Treatment Gait belt    Activity Tolerance Patient tolerated treatment well    Behavior During Therapy WFL for tasks assessed/performed                            Past Medical History:  Diagnosis Date   Ankylosing spondylitis (HCC) dx'd ~ 1974   Arthritis    "back" (08/10/2015)   BENIGN PROSTATIC HYPERTROPHY, WITH OBSTRUCTION 01/15/2010   Bleeding duodenal ulcer    Bleeding esophageal ulcer    Bleeding stomach ulcer    GERD 02/22/2008   History of blood transfusion 08/26/1986   "lost ~ 1/2 of my blood volume from multiple bleeding ulcers"   History of hiatal hernia    HYPERGLYCEMIA 11/18/2007   HYPERLIPIDEMIA 02/22/2008   HYPERTENSION, UNSPECIFIED 11/10/2009   Kidney stones    Osteoarthritis    c-spine   Parkinson's disease (HCC)    PEPTIC ULCER DISEASE 11/17/2007   Situational depression    "son died in MVA 06-25-15"   TIA (transient ischemic attack)    "not that I know of in the past; they are trying to determine if I've had one today (08/10/2015)"   Past Surgical History:  Procedure Laterality Date   CATARACT EXTRACTION W/ INTRAOCULAR LENS  IMPLANT, BILATERAL Bilateral 2013   Patient Active Problem List   Diagnosis Date Noted   Ankylosing spondylitis of cervicothoracic region (HCC) 10/16/2016   TIA (transient ischemic attack) 08/10/2015   Carotid stenosis 08/04/2014   Hyperlipidemia 02/21/2012   BENIGN PROSTATIC HYPERTROPHY,  WITH OBSTRUCTION 01/15/2010   Essential hypertension 11/10/2009   GERD 02/22/2008   NEPHROLITHIASIS, HX OF 11/17/2007    ONSET DATE: "several years"  REFERRING DIAG: G20.A2 (ICD-10-CM) - Parkinson's disease with fluctuating manifestations, unspecified whether dyskinesia present R26.81 (ICD-10-CM) - Unsteadiness on feet R41.841 (ICD-10-CM) - Cognitive communication deficit R27.8 (ICD-10-CM) - Other lack of coordination R26.2 (ICD-10-CM) - Difficulty in walking, not elsewhere classified  THERAPY DIAG:  Unsteadiness on feet  Other symptoms and signs involving the nervous system  Muscle weakness (generalized)  Other abnormalities of gait and mobility  Rationale for Evaluation and Treatment: Rehabilitation  SUBJECTIVE:  SUBJECTIVE STATEMENT: Have been to ED twice and urgent care due to the bout of several falls.  Have fractured ribs and had to have staples on back of head.  Staples taken out.  Wife does report he may have had concussion, but he has been much better in the past two days.  Pt accompanied by: significant other  PERTINENT HISTORY: PD, complex medical history of peptic ulcer disease with history of upper GI bleed, TIA, depression, degenerative disc disease in the neck, osteoarthritis, kidney stones, hyperlipidemia, hypertension, history of hiatal hernia, BPH, arthritis, and ankylosing spondylitis, and parkinsonism   PAIN:  Are you having pain? "Various places"  mid back mostly  PRECAUTIONS: Fall  RED FLAGS: None   WEIGHT BEARING RESTRICTIONS: No  FALLS: Has patient fallen in last 6 months? Yes. Number of falls unknown, multiple  LIVING ENVIRONMENT: Lives with: lives with their spouse Lives in: House/apartment Stairs: Ramp to enter, stairs to loft and basement, ground floor  set-up Has following equipment at home: Dan Humphreys - 2 wheeled  PLOF: Needs assistance with ADLs, Needs assistance with homemaking, and Needs assistance with gait, supervision for ambulation at household distances  PATIENT GOALS: improve balance, walking, reduce falls  OBJECTIVE:    TODAY'S TREATMENT: 09/19/2023 Activity Comments  Gait with RW 30 ft x 2 51.91 sec with min guard  Gait with U-Step 30 ft x 2 62 sec with min guard and cues for sequence of braking mechanism  Gait with U-Step Around furniture, 180 degree turns, pt with good stability and no freezing episodes  Sit to stand, 10 reps, throughout session  Prior to initiating walking; 2/10 reps that pt has posterior lean and has to sit to reset, otherwise, good forward lean to initiate transfer  Gait using pt's RW, negotiating around obstacles and turns Cues for WIDE walking turn and cues for TURN Walker and STOP, then move feet towards band, repeat (for tighter turn)         PATIENT EDUCATION: Education details: 09/19/2023:  Turning technique using pt's RW; discussed again benefits of U-step for improved stability with turns and potential for decreased fall risk. Person educated: Patient and Spouse Education method: Explanation, Demonstration, and Verbal cues Education comprehension: verbalized understanding and needs further education   Access Code: [email or web address] URL: [email or web address] Date: 05/08/2023 Prepared by: [facility] - Outpatient  Rehab - [facility]  Program Notes perform with wife's supervision  Exercises - Mini Squat with Counter Support  - 1 x daily - 5 x weekly - 2 sets - 10 reps - Staggered Stance Forward Backward Weight Shift with Counter Support  - 1 x daily - 5 x weekly - 2 sets - 10 reps - Standing Quarter Turn with Counter Support  - 1 x daily - 5 x weekly - 2 sets - 5 reps - Seated Long Arc Quad with Ankle Weight  - 1 x daily - 5 x weekly - 3 sets - 10 reps - Seated Hip Flexion March  with Ankle Weights  - 1 x daily - 5 x weekly - 3 sets - 10 reps - Seated Active Hip Flexion  - 1 x daily - 7 x weekly - 1 sets - 10 reps - 5-10 sec hold        ------------------------------------------------- Objective measures below taken at initial evaluation:  DIAGNOSTIC FINDINGS: n/a for episode  COGNITION: Overall cognitive status: History of cognitive impairments - at baseline   SENSATION: Neuropathy in bilat feet  COORDINATION: Difficulty with rapid alternating movements  MUSCLE TONE: NT   POSTURE: rounded shoulders, forward head, flexed trunk , and head down , kyphosis  LOWER EXTREMITY ROM:     Active  Right Eval Left Eval  Hip flexion    Hip extension    Hip abduction    Hip adduction    Hip internal rotation    Hip external rotation    Knee flexion 120 120  Knee extension 0 0  Ankle dorsiflexion 5 12  Ankle plantarflexion    Ankle inversion    Ankle eversion     (Blank rows = not tested)  LOWER EXTREMITY MMT:    BLE strength grossly 3+/5  BED MOBILITY:  Mod A  TRANSFERS: Assistive device utilized: Environmental consultant - 2 wheeled  Sit to stand: CGA and Min A Stand to sit: SBA Chair to chair: SBA and CGA Floor: Mod A and Max A  RAMP:  Level of Assistance: CGA Assistive device utilized: Environmental consultant - 2 wheeled Ramp Comments:   CURB:  Level of Assistance: CGA Assistive device utilized: Environmental consultant - 2 wheeled Curb Comments:   STAIRS: NT  GAIT: Gait pattern: step to pattern, decreased stride length, and poor foot clearance- Right Distance walked: 100 Assistive device utilized: Walker - 2 wheeled Level of assistance: SBA and CGA Comments: level surfaces  FUNCTIONAL TESTS:  5 times sit to stand: 45 sec w/ CGA Timed up and go (TUG): 1 min 27 sec  M-CTSIB  Condition 1: Firm Surface, EO 10 Sec, Severe and retro-LOB  Sway  Condition 2: Firm Surface, EC 3 Sec, Severe and retro-LOB  Sway  Condition 3: Foam Surface, EO  Sec,  Sway  Condition 4: Foam  Surface, EC  Sec,  Sway       GOALS: Goals reviewed with patient? Yes  SHORT TERM GOALS: UPDATED Target date: 05/15/2023>09/23/2023      Patient will be independent in HEP to improve functional outcomes Baseline: has been reviewed with PT and wife previously 06/04/23 Goal status: MET 06/04/23  2.  Teach-back strategies to reduce freezing of gait during turns Baseline: 16 steps to complete 180 deg turn; unable to recall 06/04/23 Goal status: NOT MET 06/04/23  3.  Demo improved safety and independence with sit to stand completing transfers with set-up assist Baseline: CGA-min A; unchanged 06/04/23 Goal status: NOT MET 06/04/23  4.  Demo ability to perform 10 backwards steps without LOB/retropulsion to improve motor control and facilitate righting reactions  Baseline: 3-5 steps before retropulsion/LOB  Goal status: INITIAL    LONG TERM GOALS: UPDATED Target date: 06/05/2023>06/20/2023> 07/17/2023> 10/21/2023        Demo reduced risk for falls per time 35 sec TUG test Baseline: 1 min 27 sec; 1 min 15 sec 06/04/23; 54 sec 06/19/23; 38 sec (07/16/23); (08/26/23) 2 min Goal status: IN PROGRESS   2.  Demo improved BLE strength and balance per time of 25 sec 5xSTS test to increase independence with mobility Baseline: 45 sec; 1 min 33 sec with occasional min A and consistent verbal cues 06/04/23; 1 min 18 sec 06/19/23; (07/16/23) 41 sec w/ arm rest push-off; (08/26/23) 52 sec Goal status: IN PROGRESS   3.  Demo improved safety with ambulation on level surfaces w/ supervision x 150 ft Baseline: SBA-CGA; occasional min A for balance and cueing to reduce freezing for 160 ft 06/04/23; 150 ft w/ supervision level surfaces x 2 min 45 sec; (08/26/23) CGA-SBA w/ RW Goal status: IN PROGRESS  4.  Improve gait speed to 1.5 ft/sec to  improve efficiency of ambulation  Baseline: 0.9 ft/sec; 1.5 ft/sec (07/16/23); 0.7 ft/sec (08/26/23)  Goal status: IN PROGRESS  ASSESSMENT:  CLINICAL  IMPRESSION: Skilled PT session today focused on ways to reinforce safety with gait and turns at home.  He has had a series of falls around the previous PT session, where pt had to go to ED x 2 and Urgent care, sustaining rib fractures and head laceration.  PT had discussion about ways to continue mobility in the home (as that is important to patient) and reduce fall risk (especially due to recent injuries); therefore, PT really discussed and we trialed U-step rollator.  Pt's current RW is often unstable, especially with turns and sometimes with straight line gait, as he lifts at least 1 wheel or 1 side from the ground, significantly reducing stability with gait.  Trialed u-step walker today, with PT and pt noting improved stability with turns.  Will need to revisit and continue this discussion.  Also worked on turns and gait with his RW and PT has to provide near constant cues for safety; wife present for discussion of cueing with turns.  OBJECTIVE IMPAIRMENTS: Abnormal gait, decreased activity tolerance, decreased balance, decreased coordination, decreased mobility, difficulty walking, decreased ROM, decreased strength, improper body mechanics, and postural dysfunction.   ACTIVITY LIMITATIONS: carrying, lifting, standing, stairs, transfers, bed mobility, and locomotion level  PARTICIPATION LIMITATIONS: meal prep, cleaning, laundry, interpersonal relationship, community activity, and exercise routine  PERSONAL FACTORS: Age, Time since onset of injury/illness/exacerbation, and 3+ comorbidities: PMH  are also affecting patient's functional outcome.   REHAB POTENTIAL: Good  CLINICAL DECISION MAKING: Evolving/moderate complexity  EVALUATION COMPLEXITY: Moderate  PLAN:  PT FREQUENCY: 1x/week  PT DURATION: 8 weeks  PLANNED INTERVENTIONS: Therapeutic exercises, Therapeutic activity, Neuromuscular re-education, Balance training, Gait training, Patient/Family education, Self Care, and Joint  mobilization  PLAN FOR NEXT SESSION: Check STGs; ask about falls.  Ask about U-step and continue practice turns.  Freezing of gait, review gym routine/at home seated PWR moves compliance  Lonia Blood, PT 09/19/23 11:54 AM Phone: [phone] Fax: [phone]  [facility] at [facility] [address] [place], [address] [place], [place] [address] Phone # [phone] Fax # [phone]

## 2023-09-19 NOTE — Therapy (Signed)
OUTPATIENT OCCUPATIONAL THERAPY PARKINSON'S  Treatment Note  Patient Name: [name] MRN: [id number] DOB:28-Apr-1936, 88 y.o., male Today's Date: 09/19/2023  PCP: Garlan Fillers, MD REFERRING PROVIDER: Huston Foley, MD     END OF SESSION:  OT End of Session - 09/19/23 1103     Visit Number 26    Number of Visits 32    Date for OT Re-Evaluation 10/24/23    Authorization Type Medicare A &B    OT Start Time 1018    OT Stop Time 1100    OT Time Calculation (min) 42 min    Equipment Utilized During Treatment gait belt    Activity Tolerance Patient tolerated treatment well    Behavior During Therapy WFL for tasks assessed/performed;Flat affect                                    Past Medical History:  Diagnosis Date   Ankylosing spondylitis (HCC) dx'd ~ 1974   Arthritis    "back" (08/10/2015)   BENIGN PROSTATIC HYPERTROPHY, WITH OBSTRUCTION 01/15/2010   Bleeding duodenal ulcer    Bleeding esophageal ulcer    Bleeding stomach ulcer    GERD 02/22/2008   History of blood transfusion 08/26/1986   "lost ~ 1/2 of my blood volume from multiple bleeding ulcers"   History of hiatal hernia    HYPERGLYCEMIA 11/18/2007   HYPERLIPIDEMIA 02/22/2008   HYPERTENSION, UNSPECIFIED 11/10/2009   Kidney stones    Osteoarthritis    c-spine   Parkinson's disease (HCC)    PEPTIC ULCER DISEASE 11/17/2007   Situational depression    "son died in MVA 06-08-15"   TIA (transient ischemic attack)    "not that I know of in the past; they are trying to determine if I've had one today (08/10/2015)"   Past Surgical History:  Procedure Laterality Date   CATARACT EXTRACTION W/ INTRAOCULAR LENS  IMPLANT, BILATERAL Bilateral 2013   Patient Active Problem List   Diagnosis Date Noted   Ankylosing spondylitis of cervicothoracic region (HCC) 10/16/2016   TIA (transient ischemic attack) 08/10/2015   Carotid stenosis 08/04/2014   Hyperlipidemia 02/21/2012   BENIGN  PROSTATIC HYPERTROPHY, WITH OBSTRUCTION 01/15/2010   Essential hypertension 11/10/2009   GERD 02/22/2008   NEPHROLITHIASIS, HX OF 11/17/2007    ONSET DATE: referral date 03/25/23  REFERRING DIAG: G20.A2 (ICD-10-CM) - Parkinson's disease without dyskinesia, with fluctuations R26.81 (ICD-10-CM) - Unsteadiness on feet R41.841 (ICD-10-CM) - Cognitive communication deficit R27.8 (ICD-10-CM) - Other lack of coordination R26.2 (ICD-10-CM) - Difficulty in walking, not elsewhere classified  THERAPY DIAG:  Unsteadiness on feet  Other symptoms and signs involving the nervous system  Muscle weakness (generalized)  Abnormal posture  Rationale for Evaluation and Treatment: Rehabilitation  SUBJECTIVE:   SUBJECTIVE STATEMENT: Pt's spouse reports that they went to ED x2 and then Urgent Care where Urgent Care recommended he be seen at ED due to deceased eye alignment and difficulty with tracking.  NP at PCP reports that he may have experienced a concussion with one of his falls, which may be attributing to vision/eye alignment concerns.    Pt's spouse reports, he had a good day yesterday.  Being that she did not have to do everything for him.  Pt accompanied by: self (spouse, [name])  PERTINENT HISTORY: history of Parkinson's and ankylosing spondylitis   PRECAUTIONS: Fall  WEIGHT BEARING RESTRICTIONS: No  PAIN:  Are you having pain? Yes "off  and on".  5-6/10 ("not that bad"), R side  FALLS: Has patient fallen in last 6 months? Yes. Number of falls has had multiple falls over the last few months with multiple in early July, and then 2 falls this month.  07/07/23 - Pt's spouse reports that he has not had a fall since Weds. 07/09/23 - Pt reports fall between midnight and 3 AM last night when returning from using the bathroom.  LIVING ENVIRONMENT: Lives with: lives with their spouse Lives in: House/apartment Stairs: Yes: External: 2-3 steps; has a ramp at entrance with raised railings Internal:  steps to basement and steps to loft - however pt does not need to go up/down those at this time. Has following equipment at home: Dan Humphreys - 2 wheeled, Environmental consultant - 4 wheeled, bed side commode, Shower chair, Ramped entry, and transport chair  PLOF: Requires assistive device for independence and Needs assistance with ADLs  PATIENT GOALS: to be able to move without concern/serious concern  OBJECTIVE:   HAND DOMINANCE: Right  ADLs: Overall ADLs: Pt reports there are times that he can be "fairly independent" with dressing but there are times where spouse has to provide significant assistance with. Transfers/ambulation related to ADLs: Utilizing RW and will occasionally walk bouts through the house without RW.  If pt loses balance backwards he is unable to correct. Eating: "fumble fingered" UB Dressing: occasional assistance LB Dressing: socks and shoes are quite difficult Toileting: "I go to the bathroom by myself" but has difficulty getting up/down from toilet Bathing: washing at the sink, spouse will assist  Tub Shower transfers: pt feels uncomfortable in the shower with shower chair, but has not fallen Equipment: Shower seat with back and Walk in shower with 5.5" ledge with 30" door  IADLs: Light housekeeping: washes dishes when he is feeling strong, however frequent onset of weakness Meal Prep: Pt was primary cook prior to fall in 2023, but has returned to participation in aspects of meal prep Community mobility: recently renewed drivers license, but is not driving  MOBILITY STATUS: Needs Assist: CGA with mobility with RW and Hx of falls   POSTURE COMMENTS:  rounded shoulders and forward head, posterior pelvic tilt and R lean  FUNCTIONAL OUTCOME MEASURES: Fastening/unfastening 3 buttons: unable to button any buttons in 1 mins Physical performance test: PPT#2 (simulated eating) 22.79 sec & PPT#4 (donning/doffing jacket): 1:05  COORDINATION: Box and Blocks:  Right 30 blocks, Left 32  blocks  06/09/23: R: 39 blocks, L: 38 blocks  UE ROM:  all movements are slowed, decreased shoulder flexion d/t ankylosing spondylitis   UE MMT:    grossly 4/5 overall  COGNITION: Overall cognitive status:  Spouse reports thinking continues to be slower  OBSERVATIONS: Bradykinesia   TODAY'S TREATMENT:                                                                         DATE:  09/19/23 Ambulation: Pt ambulating towards therapy mat with use of RW, benefiting from cues to keep B wheels on the floor as pt with tendency to lift L side of RW with ambulation - particularly when turning to R.  Pt demonstrating improvements in turning as he would take a 90* turn "hard right" with  improved stepping pattern and spacing when turning with sharp turn vs "rounding the corner". Large amplitude: engaged in modified LSVT big exercises to carry over to decreased burden of care with transitional movements and dressing. Pt still with good forward and downward reach when given targets to reach towards and verbal cues from therapist to further facilitate ROM. OT providing verbal cues and targets for upright posture when reaching up and out to side, utilizing upright poles for targets with open chest stretch.  Transfers: engaged in short distance ambulation with focus on "sharp turn" when turning to approach chair and mat table.  OT also providing cues to ensure BLE touching surface prior to sitting as pt frequently sitting prior to LLE in position in front of seat. AE: educated on use of elastic laces and/or velcro shoes for increased ease with donning and eliminating need to tie shoes.  Pt's spouse also asking about car handle assist when exiting the car.  Discussing options of locations to purchase these items.    09/09/23 Sit > stand: engaged in massed practice with sit > stand with focus on anterior weight shift and hand placement to aid in transitional movements.  Pt continues to demonstrate posterior lean  and freezing intermittently when transitioning hands from arm rests to RW.  OT providing min-mod assist for anterior weight shift.  Pt with 1 LOB backwards after freezing in standing, benefiting from mod assist to aid in lowering safety to sitting on mat table. Stand pivot transfers: engaged in massed practice with focus on anterior weight shift and large steps when transferring.  Pt with mild improvements when transferring to R > to L.  OT providing mod verbal cues and up to min assist for stand pivot transfers due to posterior lean and freezing.  When pt utilizing larger steps, pt with improvements in ease with transfers. Large amplitude: engaged in modified LSVT big exercises to carry over to decreased burden of care with transitional movements and dressing. Pt still with good forward and downward reach when given targets to reach towards and verbal cues from therapist to further facilitate ROM. OT providing verbal cues and targets for upright posture when reaching up and out to side.  OT recommending attempting open reach in supine to further facilitate scapular retraction with assist of gravity.  Pt demonstrating waxing and waning amplitude with repetition.     08/26/23 Self-care: pt reports that aspects of dressing can vary.  Pt reports that he is generally able to get a shirt on with only supervision/setup but when in "a time crunch" that he has more difficulty. Pt sill requires assistance to get socks started and then he is able to pull them on the remainder of the way.  OT re-educated on use of shoe funnel to aid in donning shoes.  Completed massed practice first with demonstration from therapist followed by attempts of pt to complete.  Pt continues to require mod-max cues for setup and sequencing with novel adaptation/equipment.  Pt's spouse still interested in use of shoe funnel with plan to purchase and continue to address. Large amplitude: engaged in modified LSVT big exercises to carry over to  decreased burden of care with transitional movements and dressing. Pt demonstrating improved forward and downward reach when given targets to reach towards and verbal cues from therapist to further facilitate ROM. OT providing verbal cues and targets for upright posture when reaching up and out to side.  OT providing encouragement and use of mirror to facilitate increased upright posture  and reach. Pt demonstrating waxing and waning amplitude with repetition.  Don/doff jacket: Trial 1: 1:03 and Trial 2: 1:08  Pt demonstrating good carryover of large amplitude technique when doffing jacket, however continues to have difficulty with sequencing and mobility when donning jacket.   PATIENT EDUCATION: Education details: see treatment note above, massed practice with sit > stand and stand pivot transfers Person educated: Patient and Spouse Education method: Explanation and Verbal cues Education comprehension: verbalized understanding and needs further education  HOME EXERCISE PROGRAM: 06/25/23 - Handouts: V/c for sit-to-stand (see 06/25/23 pt instructions), additional copy of 05/14/23 "Bag Exercises" (see 05/14/23 pt instructions)  LSVT big exercises  Access Code: [email or web address] URL: [email or web address] Date: 06/23/2023 Prepared by: [facility] - [facility]  Exercises - Seated Figure 4 Piriformis Stretch  - 1 x daily - 10 reps - 15 sec hold - Seated Piriformis Stretch  - 1 x daily - 10 reps - 15 sec hold - Seated Single Knee to Chest  - 1 x daily - 10 reps - 5 sec hold  07/09/23 - Provided examples of no-tie lace options for LB dressing. (Handout, see pt instructions).   GOALS: Goals reviewed with patient? Yes  SHORT TERM GOALS: Target date: 09/26/23  Pt and spouse will be independent in full body HEP with focus on large amplitude movements as needed to maintain/preserve function as pertaining to ADLs/IADLs. Baseline: Goal status: IN PROGRESS  2.  Pt will  demonstrate ability to complete sit > stand as needed to get up from toilet or other seating options in home by completing x3 throughout session without cues or physical assistance to maintain/preserve function. Baseline: occasionally requiring cues and/or physical assistance Goal status:  IN PROGRESS  3. Pt and spouse will verbalize understanding of tips for fall prevention.  Baseline:  Goal status:  IN PROGRESS   LONG TERM GOALS: Target date: 10/24/23  Pt will report ability to complete UB/LB dressing at Supervision/setup level with use of AE and/or alternative strategies PRN.  Baseline: Min A Goal status: IN PROGRESS  2.  Pt and spouse will report understanding of adaptive techniques/strategies and/or modifications to routines to increase safety with mobility and self-care tasks in the home.  Baseline:  Goal status: IN PROGRESS  3.  Pt will demonstrate ability to maintain sequencing and timing with don/doff jacket to maintain in 1-1:10 minute range with use of adaptive strategies PRN to preserve function with UB dressing and don/doff jacket.  Baseline: 1:05 Goal status: IN PROGRESS  4.  Pt and spouse will be independent in updated PD specific HEP to maintain/preserve function as needed for ADLs and quality of life. Baseline: Goal status: IN PROGRESS  ASSESSMENT:  CLINICAL IMPRESSION: Pt demonstrating much improved ease with sit > stand and with ambulatory transfers this session with focus on "hard turn", however still benefiting from CGA and cues to ensure BLe touching surface before sitting.  Pt continues to benefit from exercises to facilitate carryover for amplitude and weight shifting into functional tasks.  Pt's spouse pleased with pt participation this session and recommendations for AE to increase ease with aspects of dressing and mobility out of care.  PERFORMANCE DEFICITS: in functional skills including ADLs, IADLs, coordination, dexterity, ROM, strength, pain, flexibility,  Fine motor control, Gross motor control, balance, body mechanics, endurance, decreased knowledge of precautions, decreased knowledge of use of DME, and UE functional use and psychosocial skills including coping strategies, environmental adaptation, and routines and behaviors.   IMPAIRMENTS: are limiting  patient from ADLs and IADLs.    PLAN:  OT FREQUENCY: 1x/week  OT DURATION: 8 weeks  PLANNED INTERVENTIONS: self care/ADL training, therapeutic exercise, therapeutic activity, neuromuscular re-education, balance training, functional mobility training, ultrasound, compression bandaging, moist heat, cryotherapy, patient/family education, cognitive remediation/compensation, psychosocial skills training, energy conservation, coping strategies training, and DME and/or AE instructions  RECOMMENDED OTHER SERVICES: NA  CONSULTED AND AGREED WITH PLAN OF CARE: Patient  PLAN FOR NEXT SESSION:   Continue to review bag exercises for carryover to dressing Large amplitude and LSVT big exercises Sit > stand Education: Safety, reducing fall risk UB and LB dressing adaptive strategies    Kolbee Bogusz, OTR/L 09/19/2023, 11:03 AM   [facility] at [facility] [address] [place], [place] [address] Phone # [phone] Fax # [phone]

## 2023-09-19 NOTE — Patient Instructions (Signed)
    Auto Estate manager/land agent Supports up to 300 Pounds  4.5 out of 5 stars  4,310 2K+ bought in past month Limited time deal $7.99 Typical: $9.99

## 2023-09-24 ENCOUNTER — Ambulatory Visit: Payer: Medicare Other

## 2023-09-24 ENCOUNTER — Ambulatory Visit: Payer: Medicare Other | Admitting: Occupational Therapy

## 2023-09-24 DIAGNOSIS — M6281 Muscle weakness (generalized): Secondary | ICD-10-CM | POA: Diagnosis not present

## 2023-09-24 DIAGNOSIS — R293 Abnormal posture: Secondary | ICD-10-CM

## 2023-09-24 DIAGNOSIS — R29818 Other symptoms and signs involving the nervous system: Secondary | ICD-10-CM | POA: Diagnosis not present

## 2023-09-24 DIAGNOSIS — R2689 Other abnormalities of gait and mobility: Secondary | ICD-10-CM

## 2023-09-24 DIAGNOSIS — G20A2 Parkinson's disease without dyskinesia, with fluctuations: Secondary | ICD-10-CM

## 2023-09-24 DIAGNOSIS — R2681 Unsteadiness on feet: Secondary | ICD-10-CM

## 2023-09-24 DIAGNOSIS — R262 Difficulty in walking, not elsewhere classified: Secondary | ICD-10-CM

## 2023-09-24 NOTE — Therapy (Signed)
OUTPATIENT PHYSICAL THERAPY NEURO TREATMENT NOTE Patient Name: [name] MRN: [id number] DOB:[birth year], 88 y.o., male Today's Date: 09/24/2023   PCP: Garlan Fillers REFERRING PROVIDER: Huston Foley, MD  END OF SESSION:  PT End of Session - 09/24/23 1532     Visit Number 29    Number of Visits 34    Date for PT Re-Evaluation 10/21/23    Authorization Type Medicare/[employer]    Progress Note Due on Visit 36    PT Start Time 1530    PT Stop Time 1615    PT Time Calculation (min) 45 min    Equipment Utilized During Treatment Gait belt    Activity Tolerance Patient tolerated treatment well    Behavior During Therapy WFL for tasks assessed/performed                            Past Medical History:  Diagnosis Date   Ankylosing spondylitis (HCC) dx'd ~ 1974   Arthritis    "back" (08/10/2015)   BENIGN PROSTATIC HYPERTROPHY, WITH OBSTRUCTION 01/15/2010   Bleeding duodenal ulcer    Bleeding esophageal ulcer    Bleeding stomach ulcer    GERD 02/22/2008   History of blood transfusion 08/26/1986   "lost ~ 1/2 of my blood volume from multiple bleeding ulcers"   History of hiatal hernia    HYPERGLYCEMIA 11/18/2007   HYPERLIPIDEMIA 02/22/2008   HYPERTENSION, UNSPECIFIED 11/10/2009   Kidney stones    Osteoarthritis    c-spine   Parkinson's disease (HCC)    PEPTIC ULCER DISEASE 11/17/2007   Situational depression    "son died in MVA 06/09/2015"   TIA (transient ischemic attack)    "not that I know of in the past; they are trying to determine if I've had one today (08/10/2015)"   Past Surgical History:  Procedure Laterality Date   CATARACT EXTRACTION W/ INTRAOCULAR LENS  IMPLANT, BILATERAL Bilateral 2013   Patient Active Problem List   Diagnosis Date Noted   Ankylosing spondylitis of cervicothoracic region (HCC) 10/16/2016   TIA (transient ischemic attack) 08/10/2015   Carotid stenosis 08/04/2014   Hyperlipidemia 02/21/2012   BENIGN PROSTATIC HYPERTROPHY,  WITH OBSTRUCTION 01/15/2010   Essential hypertension 11/10/2009   GERD 02/22/2008   NEPHROLITHIASIS, HX OF 11/17/2007    ONSET DATE: "several years"  REFERRING DIAG: G20.A2 (ICD-10-CM) - Parkinson's disease with fluctuating manifestations, unspecified whether dyskinesia present R26.81 (ICD-10-CM) - Unsteadiness on feet R41.841 (ICD-10-CM) - Cognitive communication deficit R27.8 (ICD-10-CM) - Other lack of coordination R26.2 (ICD-10-CM) - Difficulty in walking, not elsewhere classified  THERAPY DIAG:  Unsteadiness on feet  Other symptoms and signs involving the nervous system  Muscle weakness (generalized)  Abnormal posture  Other abnormalities of gait and mobility  Parkinson's disease with fluctuating manifestations, unspecified whether dyskinesia present (HCC)  Difficulty in walking, not elsewhere classified  Rationale for Evaluation and Treatment: Rehabilitation  SUBJECTIVE:  SUBJECTIVE STATEMENT: Doing better past couple of days, ribs aren't too sore Pt accompanied by: significant other  PERTINENT HISTORY: PD, complex medical history of peptic ulcer disease with history of upper GI bleed, TIA, depression, degenerative disc disease in the neck, osteoarthritis, kidney stones, hyperlipidemia, hypertension, history of hiatal hernia, BPH, arthritis, and ankylosing spondylitis, and parkinsonism   PAIN:  Are you having pain? "Various places"  mid back mostly  PRECAUTIONS: Fall  RED FLAGS: None   WEIGHT BEARING RESTRICTIONS: No  FALLS: Has patient fallen in last 6 months? Yes. Number of falls unknown, multiple  LIVING ENVIRONMENT: Lives with: lives with their spouse Lives in: House/apartment Stairs: Ramp to enter, stairs to loft and basement, ground floor set-up Has following equipment  at home: Dan Humphreys - 2 wheeled  PLOF: Needs assistance with ADLs, Needs assistance with homemaking, and Needs assistance with gait, supervision for ambulation at household distances  PATIENT GOALS: improve balance, walking, reduce falls  OBJECTIVE:   TODAY'S TREATMENT: 09/24/23 Activity Comments  Gait training W/ U-step SBA level surfaces, cues in brake function and step length. Cues for step through when negotiating turns to reduce freezing of gait  STRENGTH -seated single arm row 1x10: 10#, 20#, 25# -seated hamstring curls 1x10: 10#, 15#, 20# -seated bilat row w/ trunk flexion-extension 1x10: 20#, 25# -LAQ 3x10 5# -alt stair taps 3x10 5# -sidestepping x 2 min 5# along counter                  TODAY'S TREATMENT: 09/19/2023 Activity Comments  Gait with RW 30 ft x 2 51.91 sec with min guard  Gait with U-Step 30 ft x 2 62 sec with min guard and cues for sequence of braking mechanism  Gait with U-Step Around furniture, 180 degree turns, pt with good stability and no freezing episodes  Sit to stand, 10 reps, throughout session  Prior to initiating walking; 2/10 reps that pt has posterior lean and has to sit to reset, otherwise, good forward lean to initiate transfer  Gait using pt's RW, negotiating around obstacles and turns Cues for WIDE walking turn and cues for TURN Walker and STOP, then move feet towards band, repeat (for tighter turn)         PATIENT EDUCATION: Education details: 09/19/2023:  Turning technique using pt's RW; discussed again benefits of U-step for improved stability with turns and potential for decreased fall risk. Person educated: Patient and Spouse Education method: Explanation, Demonstration, and Verbal cues Education comprehension: verbalized understanding and needs further education   Access Code: [email or web address] URL: [email or web address] Date: 05/08/2023 Prepared by: [facility] - Outpatient  Rehab - [facility]  Program Notes perform with  wife's supervision  Exercises - Mini Squat with Counter Support  - 1 x daily - 5 x weekly - 2 sets - 10 reps - Staggered Stance Forward Backward Weight Shift with Counter Support  - 1 x daily - 5 x weekly - 2 sets - 10 reps - Standing Quarter Turn with Counter Support  - 1 x daily - 5 x weekly - 2 sets - 5 reps - Seated Long Arc Quad with Ankle Weight  - 1 x daily - 5 x weekly - 3 sets - 10 reps - Seated Hip Flexion March with Ankle Weights  - 1 x daily - 5 x weekly - 3 sets - 10 reps - Seated Active Hip Flexion  - 1 x daily - 7 x weekly - 1 sets - 10 reps - 5-10 sec hold        -------------------------------------------------  Objective measures below taken at initial evaluation:  DIAGNOSTIC FINDINGS: n/a for episode  COGNITION: Overall cognitive status: History of cognitive impairments - at baseline   SENSATION: Neuropathy in bilat feet  COORDINATION: Difficulty with rapid alternating movements    MUSCLE TONE: NT   POSTURE: rounded shoulders, forward head, flexed trunk , and head down , kyphosis  LOWER EXTREMITY ROM:     Active  Right Eval Left Eval  Hip flexion    Hip extension    Hip abduction    Hip adduction    Hip internal rotation    Hip external rotation    Knee flexion 120 120  Knee extension 0 0  Ankle dorsiflexion 5 12  Ankle plantarflexion    Ankle inversion    Ankle eversion     (Blank rows = not tested)  LOWER EXTREMITY MMT:    BLE strength grossly 3+/5  BED MOBILITY:  Mod A  TRANSFERS: Assistive device utilized: Environmental consultant - 2 wheeled  Sit to stand: CGA and Min A Stand to sit: SBA Chair to chair: SBA and CGA Floor: Mod A and Max A  RAMP:  Level of Assistance: CGA Assistive device utilized: Environmental consultant - 2 wheeled Ramp Comments:   CURB:  Level of Assistance: CGA Assistive device utilized: Environmental consultant - 2 wheeled Curb Comments:   STAIRS: NT  GAIT: Gait pattern: step to pattern, decreased stride length, and poor foot clearance-  Right Distance walked: 100 Assistive device utilized: Walker - 2 wheeled Level of assistance: SBA and CGA Comments: level surfaces  FUNCTIONAL TESTS:  5 times sit to stand: 45 sec w/ CGA Timed up and go (TUG): 1 min 27 sec  M-CTSIB  Condition 1: Firm Surface, EO 10 Sec, Severe and retro-LOB  Sway  Condition 2: Firm Surface, EC 3 Sec, Severe and retro-LOB  Sway  Condition 3: Foam Surface, EO  Sec,  Sway  Condition 4: Foam Surface, EC  Sec,  Sway       GOALS: Goals reviewed with patient? Yes  SHORT TERM GOALS: UPDATED Target date: 05/15/2023>09/23/2023      Patient will be independent in HEP to improve functional outcomes Baseline: has been reviewed with PT and wife previously 06/04/23 Goal status: MET 06/04/23  2.  Teach-back strategies to reduce freezing of gait during turns Baseline: 16 steps to complete 180 deg turn; unable to recall 06/04/23 Goal status: NOT MET 06/04/23  3.  Demo improved safety and independence with sit to stand completing transfers with set-up assist Baseline: CGA-min A; unchanged 06/04/23 Goal status: NOT MET 06/04/23  4.  Demo ability to perform 10 backwards steps without LOB/retropulsion to improve motor control and facilitate righting reactions  Baseline: 3-5 steps before retropulsion/LOB  Goal status: INITIAL    LONG TERM GOALS: UPDATED Target date: 06/05/2023>06/20/2023> 07/17/2023> 10/21/2023        Demo reduced risk for falls per time 35 sec TUG test Baseline: 1 min 27 sec; 1 min 15 sec 06/04/23; 54 sec 06/19/23; 38 sec (07/16/23); (08/26/23) 2 min Goal status: IN PROGRESS   2.  Demo improved BLE strength and balance per time of 25 sec 5xSTS test to increase independence with mobility Baseline: 45 sec; 1 min 33 sec with occasional min A and consistent verbal cues 06/04/23; 1 min 18 sec 06/19/23; (07/16/23) 41 sec w/ arm rest push-off; (08/26/23) 52 sec Goal status: IN PROGRESS   3.  Demo improved safety with ambulation on level surfaces w/  supervision x 150 ft Baseline: SBA-CGA;  occasional min A for balance and cueing to reduce freezing for 160 ft 06/04/23; 150 ft w/ supervision level surfaces x 2 min 45 sec; (08/26/23) CGA-SBA w/ RW Goal status: IN PROGRESS  4.  Improve gait speed to 1.5 ft/sec to improve efficiency of ambulation  Baseline: 0.9 ft/sec; 1.5 ft/sec (07/16/23); 0.7 ft/sec (08/26/23)  Goal status: IN PROGRESS  ASSESSMENT:  CLINICAL IMPRESSION: Initiated session with trial of retro-walking but exhibits strong retropulsion today with difficulty in regaining postural control.  Continued with focus on general strength with emphasis on compound upper/lower body movements at 80% RPE to improve strength and power for mobility. Gait training trials with U-step demonstrating much improved stability by heavier/wider device as evidenced by safer turns without lateral LOB or AD tipping.  Continued sessions indicated to improve mobility and motor control to reduce risk for falls and implement relevant adaptations and compensatory strategies  OBJECTIVE IMPAIRMENTS: Abnormal gait, decreased activity tolerance, decreased balance, decreased coordination, decreased mobility, difficulty walking, decreased ROM, decreased strength, improper body mechanics, and postural dysfunction.   ACTIVITY LIMITATIONS: carrying, lifting, standing, stairs, transfers, bed mobility, and locomotion level  PARTICIPATION LIMITATIONS: meal prep, cleaning, laundry, interpersonal relationship, community activity, and exercise routine  PERSONAL FACTORS: Age, Time since onset of injury/illness/exacerbation, and 3+ comorbidities: PMH  are also affecting patient's functional outcome.   REHAB POTENTIAL: Good  CLINICAL DECISION MAKING: Evolving/moderate complexity  EVALUATION COMPLEXITY: Moderate  PLAN:  PT FREQUENCY: 1x/week  PT DURATION: 8 weeks  PLANNED INTERVENTIONS: Therapeutic exercises, Therapeutic activity, Neuromuscular re-education, Balance  training, Gait training, Patient/Family education, Self Care, and Joint mobilization  PLAN FOR NEXT SESSION: Check STGs; ask about falls.  Ask about U-step and continue practice turns.  Freezing of gait, review gym routine/at home seated PWR moves compliance  5:12 PM, 09/24/23 M. Shary Decamp, PT, DPT Physical Therapist- [place] Office Number: [phone]

## 2023-09-24 NOTE — Therapy (Signed)
OUTPATIENT OCCUPATIONAL THERAPY PARKINSON'S  Treatment Note  Patient Name: [name] MRN: [id number] DOB:25-Nov-1935, 88 y.o., male Today's Date: 09/24/2023  PCP: Garlan Fillers, MD REFERRING PROVIDER: Huston Foley, MD     END OF SESSION:  OT End of Session - 09/24/23 1453     Visit Number 27    Number of Visits 32    Date for OT Re-Evaluation 10/24/23    Authorization Type Medicare A &B    Progress Note Due on Visit 30    OT Start Time 1405    OT Stop Time 1445    OT Time Calculation (min) 40 min    Equipment Utilized During Treatment gait belt    Activity Tolerance Patient tolerated treatment well    Behavior During Therapy WFL for tasks assessed/performed;Flat affect                                     Past Medical History:  Diagnosis Date   Ankylosing spondylitis (HCC) dx'd ~ 1974   Arthritis    "back" (08/10/2015)   BENIGN PROSTATIC HYPERTROPHY, WITH OBSTRUCTION 01/15/2010   Bleeding duodenal ulcer    Bleeding esophageal ulcer    Bleeding stomach ulcer    GERD 02/22/2008   History of blood transfusion 08/26/1986   "lost ~ 1/2 of my blood volume from multiple bleeding ulcers"   History of hiatal hernia    HYPERGLYCEMIA 11/18/2007   HYPERLIPIDEMIA 02/22/2008   HYPERTENSION, UNSPECIFIED 11/10/2009   Kidney stones    Osteoarthritis    c-spine   Parkinson's disease (HCC)    PEPTIC ULCER DISEASE 11/17/2007   Situational depression    "son died in MVA June 04, 2015"   TIA (transient ischemic attack)    "not that I know of in the past; they are trying to determine if I've had one today (08/10/2015)"   Past Surgical History:  Procedure Laterality Date   CATARACT EXTRACTION W/ INTRAOCULAR LENS  IMPLANT, BILATERAL Bilateral 2013   Patient Active Problem List   Diagnosis Date Noted   Ankylosing spondylitis of cervicothoracic region (HCC) 10/16/2016   TIA (transient ischemic attack) 08/10/2015   Carotid stenosis 08/04/2014    Hyperlipidemia 02/21/2012   BENIGN PROSTATIC HYPERTROPHY, WITH OBSTRUCTION 01/15/2010   Essential hypertension 11/10/2009   GERD 02/22/2008   NEPHROLITHIASIS, HX OF 11/17/2007    ONSET DATE: referral date 03/25/23  REFERRING DIAG: G20.A2 (ICD-10-CM) - Parkinson's disease without dyskinesia, with fluctuations R26.81 (ICD-10-CM) - Unsteadiness on feet R41.841 (ICD-10-CM) - Cognitive communication deficit R27.8 (ICD-10-CM) - Other lack of coordination R26.2 (ICD-10-CM) - Difficulty in walking, not elsewhere classified  THERAPY DIAG:  Unsteadiness on feet  Other symptoms and signs involving the nervous system  Muscle weakness (generalized)  Abnormal posture  Rationale for Evaluation and Treatment: Rehabilitation  SUBJECTIVE:   SUBJECTIVE STATEMENT: Pt reported improved ind over last 3-4 days for sit-to-stands and general movements. Pt reported not using walker at home inside the house as frequently.  Pt accompanied by: self and Sherilyn Cooter (caregiver)  PERTINENT HISTORY: history of Parkinson's and ankylosing spondylitis   PRECAUTIONS: Fall  WEIGHT BEARING RESTRICTIONS: No  PAIN:  Are you having pain? Yes "off and on".  5-6/10 ("not that bad"), R side  FALLS: Has patient fallen in last 6 months? Yes. Number of falls has had multiple falls over the last few months with multiple in early July, and then 2 falls this month.  07/07/23 -  Pt's spouse reports that he has not had a fall since Weds. 07/09/23 - Pt reports fall between midnight and 3 AM last night when returning from using the bathroom.  09/24/23 - 2 falls about 2 weeks ago, fell backwards while attempting to stand. Went to [facility] ED though not admitted. Per 09/10/23 ED Note: Interpretation of Diagnostics CT imaging reveals 3 acute rib fractures, unclear if they are from the most recent fall or the prior fall earlier today.  CT head and cervical spine largely unremarkable.  LIVING ENVIRONMENT: Lives with: lives with their  spouse Lives in: House/apartment Stairs: Yes: External: 2-3 steps; has a ramp at entrance with raised railings Internal: steps to basement and steps to loft - however pt does not need to go up/down those at this time. Has following equipment at home: Dan Humphreys - 2 wheeled, Environmental consultant - 4 wheeled, bed side commode, Shower chair, Ramped entry, and transport chair  PLOF: Requires assistive device for independence and Needs assistance with ADLs  PATIENT GOALS: to be able to move without concern/serious concern  OBJECTIVE:   HAND DOMINANCE: Right  ADLs: Overall ADLs: Pt reports there are times that he can be "fairly independent" with dressing but there are times where spouse has to provide significant assistance with. Transfers/ambulation related to ADLs: Utilizing RW and will occasionally walk bouts through the house without RW.  If pt loses balance backwards he is unable to correct. Eating: "fumble fingered" UB Dressing: occasional assistance LB Dressing: socks and shoes are quite difficult Toileting: "I go to the bathroom by myself" but has difficulty getting up/down from toilet Bathing: washing at the sink, spouse will assist  Tub Shower transfers: pt feels uncomfortable in the shower with shower chair, but has not fallen Equipment: Shower seat with back and Walk in shower with 5.5" ledge with 30" door  IADLs: Light housekeeping: washes dishes when he is feeling strong, however frequent onset of weakness Meal Prep: Pt was primary cook prior to fall in 2023, but has returned to participation in aspects of meal prep Community mobility: recently renewed drivers license, but is not driving  MOBILITY STATUS: Needs Assist: CGA with mobility with RW and Hx of falls   POSTURE COMMENTS:  rounded shoulders and forward head, posterior pelvic tilt and R lean  FUNCTIONAL OUTCOME MEASURES: Fastening/unfastening 3 buttons: unable to button any buttons in 1 mins Physical performance test: PPT#2  (simulated eating) 22.79 sec & PPT#4 (donning/doffing jacket): 1:05  COORDINATION: Box and Blocks:  Right 30 blocks, Left 32 blocks  06/09/23: R: 39 blocks, L: 38 blocks  UE ROM:  all movements are slowed, decreased shoulder flexion d/t ankylosing spondylitis   UE MMT:    grossly 4/5 overall  COGNITION: Overall cognitive status:  Spouse reports thinking continues to be slower  OBSERVATIONS: Bradykinesia   TODAY'S TREATMENT:                                                                         DATE:   TherAct Gait belt for all ambulation and standing tasks today. Pt benefited from min v/c and tactile cues to keep walker on floor when ambulating to enter clinic.  Education/Safety: OT educated pt and caregiver on PD dx, importance  of large amplitude movements, strategies to reduce fall risk, and importance of using walker as recommended by PT to reduce fall risk. Pt and caregiver verbalized understanding.  Large Amplitude: Moving rings across large arc, seated  - to improve large amplitude movements with BUE, to improve sequencing of motor movements, to improve FM coordination of BUE - Pt required max v/c and tactile cues to maintain grasp of ring throughout full arc motion of B hands d/t pt attempting to release and toss rings across arc.   Sit-to-stand Transfers: Sit-to-stand/stand-to-sit practice, Sports administrator with Jenga blocks at tabletop while standing - standing with gait belt, minA to CGA - to improve standing tolerance, to improve static standing balance, to improve safety during sit-to-stand and stand-to-sit transfers, to improve FM coordination of BUE. Pt required decreased assistance for sit-to-stand and stand-to-sit as task progressed though continued to benefit from mod to max v/c for upright standing posture and for each step of transfer to improve safety: "nose over toes" to stand, chair behind BLE to sit, weight shifting forward d/t posterior lean, shoulders back, wide  base of support.   PATIENT EDUCATION: Education details: see treatment note above Person educated: Patient and caregiver Education method: Explanation and Verbal cues Education comprehension: verbalized understanding and needs further education  HOME EXERCISE PROGRAM: 06/25/23 - Handouts: V/c for sit-to-stand (see 06/25/23 pt instructions), additional copy of 05/14/23 "Bag Exercises" (see 05/14/23 pt instructions)  LSVT big exercises  Access Code: [email or web address] URL: [email or web address] Date: 06/23/2023 Prepared by: [facility] - Outpatient  Rehab - [facility]  Exercises - Seated Figure 4 Piriformis Stretch  - 1 x daily - 10 reps - 15 sec hold - Seated Piriformis Stretch  - 1 x daily - 10 reps - 15 sec hold - Seated Single Knee to Chest  - 1 x daily - 10 reps - 5 sec hold  07/09/23 - Provided examples of no-tie lace options for LB dressing. (Handout, see pt instructions).   GOALS: Goals reviewed with patient? Yes  SHORT TERM GOALS: Target date: 09/26/23  Pt and spouse will be independent in full body HEP with focus on large amplitude movements as needed to maintain/preserve function as pertaining to ADLs/IADLs. Baseline: Goal status: IN PROGRESS  2.  Pt will demonstrate ability to complete sit > stand as needed to get up from toilet or other seating options in home by completing x3 throughout session without cues or physical assistance to maintain/preserve function. Baseline: occasionally requiring cues and/or physical assistance Goal status:  IN PROGRESS  3. Pt and spouse will verbalize understanding of tips for fall prevention.  Baseline:  Goal status:  IN PROGRESS   LONG TERM GOALS: Target date: 10/24/23  Pt will report ability to complete UB/LB dressing at Supervision/setup level with use of AE and/or alternative strategies PRN.  Baseline: Min A Goal status: IN PROGRESS  2.  Pt and spouse will report understanding of adaptive techniques/strategies  and/or modifications to routines to increase safety with mobility and self-care tasks in the home.  Baseline:  Goal status: IN PROGRESS  3.  Pt will demonstrate ability to maintain sequencing and timing with don/doff jacket to maintain in 1-1:10 minute range with use of adaptive strategies PRN to preserve function with UB dressing and don/doff jacket.  Baseline: 1:05 Goal status: IN PROGRESS  4.  Pt and spouse will be independent in updated PD specific HEP to maintain/preserve function as needed for ADLs and quality of life. Baseline: Goal status: IN PROGRESS  ASSESSMENT:  CLINICAL IMPRESSION: Pt demonstrating much improved ease with sit > stand, however still benefiting from minA to CGA and cues to ensure safety. Pt tolerated tasks well today. Pt would continue to benefit from skilled OT services to improve pt understanding of strategies to reduce fall risk, improve safety, and decrease caregiver burden.  PERFORMANCE DEFICITS: in functional skills including ADLs, IADLs, coordination, dexterity, ROM, strength, pain, flexibility, Fine motor control, Gross motor control, balance, body mechanics, endurance, decreased knowledge of precautions, decreased knowledge of use of DME, and UE functional use and psychosocial skills including coping strategies, environmental adaptation, and routines and behaviors.   IMPAIRMENTS: are limiting patient from ADLs and IADLs.    PLAN:  OT FREQUENCY: 1x/week  OT DURATION: 8 weeks  PLANNED INTERVENTIONS: self care/ADL training, therapeutic exercise, therapeutic activity, neuromuscular re-education, balance training, functional mobility training, ultrasound, compression bandaging, moist heat, cryotherapy, patient/family education, cognitive remediation/compensation, psychosocial skills training, energy conservation, coping strategies training, and DME and/or AE instructions  RECOMMENDED OTHER SERVICES: NA  CONSULTED AND AGREED WITH PLAN OF CARE:  Patient  PLAN FOR NEXT SESSION:   Continue to review bag exercises for carryover to dressing Large amplitude and LSVT big exercises - continue to review Sit > stand  Education: Safety, reducing fall risk UB and LB dressing adaptive strategies    Wynetta Emery, OTR/L 09/24/2023, 3:09 PM   [facility] [address], [address] [place], [place] [address] Phone # [phone] Fax # [phone]

## 2023-09-29 ENCOUNTER — Other Ambulatory Visit: Payer: Self-pay | Admitting: Neurology

## 2023-09-29 DIAGNOSIS — Z9181 History of falling: Secondary | ICD-10-CM

## 2023-09-29 DIAGNOSIS — K5909 Other constipation: Secondary | ICD-10-CM

## 2023-09-29 DIAGNOSIS — Z789 Other specified health status: Secondary | ICD-10-CM

## 2023-09-29 DIAGNOSIS — G20C Parkinsonism, unspecified: Secondary | ICD-10-CM

## 2023-09-29 DIAGNOSIS — R351 Nocturia: Secondary | ICD-10-CM

## 2023-09-29 DIAGNOSIS — N318 Other neuromuscular dysfunction of bladder: Secondary | ICD-10-CM

## 2023-10-01 ENCOUNTER — Ambulatory Visit: Payer: Medicare Other | Attending: Neurology

## 2023-10-01 ENCOUNTER — Ambulatory Visit: Payer: Medicare Other | Admitting: Occupational Therapy

## 2023-10-01 DIAGNOSIS — R2689 Other abnormalities of gait and mobility: Secondary | ICD-10-CM | POA: Diagnosis not present

## 2023-10-01 DIAGNOSIS — R262 Difficulty in walking, not elsewhere classified: Secondary | ICD-10-CM | POA: Insufficient documentation

## 2023-10-01 DIAGNOSIS — R293 Abnormal posture: Secondary | ICD-10-CM | POA: Diagnosis not present

## 2023-10-01 DIAGNOSIS — R29818 Other symptoms and signs involving the nervous system: Secondary | ICD-10-CM | POA: Diagnosis not present

## 2023-10-01 DIAGNOSIS — M6281 Muscle weakness (generalized): Secondary | ICD-10-CM | POA: Insufficient documentation

## 2023-10-01 DIAGNOSIS — R278 Other lack of coordination: Secondary | ICD-10-CM | POA: Insufficient documentation

## 2023-10-01 DIAGNOSIS — G20A2 Parkinson's disease without dyskinesia, with fluctuations: Secondary | ICD-10-CM | POA: Insufficient documentation

## 2023-10-01 DIAGNOSIS — R2681 Unsteadiness on feet: Secondary | ICD-10-CM | POA: Insufficient documentation

## 2023-10-01 NOTE — Therapy (Signed)
 OUTPATIENT PHYSICAL THERAPY NEURO TREATMENT NOTE Patient Name: [name] MRN: [id number] DOB:1936/05/14, 88 y.o., male Today's Date: 10/01/2023   PCP: Yolande Toribio MATSU REFERRING PROVIDER: Buck Saucer, MD  END OF SESSION:  PT End of Session - 10/01/23 1524     Visit Number 30    Number of Visits 34    Date for PT Re-Evaluation 10/21/23    Authorization Type Medicare/[employer]    Progress Note Due on Visit 36    PT Start Time 1530    PT Stop Time 1615    PT Time Calculation (min) 45 min    Equipment Utilized During Treatment Gait belt    Activity Tolerance Patient tolerated treatment well    Behavior During Therapy WFL for tasks assessed/performed                            Past Medical History:  Diagnosis Date   Ankylosing spondylitis (HCC) dx'd ~ 1974   Arthritis    back (08/10/2015)   BENIGN PROSTATIC HYPERTROPHY, WITH OBSTRUCTION 01/15/2010   Bleeding duodenal ulcer    Bleeding esophageal ulcer    Bleeding stomach ulcer    GERD 02/22/2008   History of blood transfusion 08/26/1986   lost ~ 1/2 of my blood volume from multiple bleeding ulcers   History of hiatal hernia    HYPERGLYCEMIA 11/18/2007   HYPERLIPIDEMIA 02/22/2008   HYPERTENSION, UNSPECIFIED 11/10/2009   Kidney stones    Osteoarthritis    c-spine   Parkinson's disease (HCC)    PEPTIC ULCER DISEASE 11/17/2007   Situational depression    son died in MVA 16-Jun-2015   TIA (transient ischemic attack)    not that I know of in the past; they are trying to determine if I've had one today (08/10/2015)   Past Surgical History:  Procedure Laterality Date   CATARACT EXTRACTION W/ INTRAOCULAR LENS  IMPLANT, BILATERAL Bilateral 2013   Patient Active Problem List   Diagnosis Date Noted   Ankylosing spondylitis of cervicothoracic region (HCC) 10/16/2016   TIA (transient ischemic attack) 08/10/2015   Carotid stenosis 08/04/2014   Hyperlipidemia 02/21/2012   BENIGN PROSTATIC HYPERTROPHY,  WITH OBSTRUCTION 01/15/2010   Essential hypertension 11/10/2009   GERD 02/22/2008   NEPHROLITHIASIS, HX OF 11/17/2007    ONSET DATE: several years  REFERRING DIAG: G20.A2 (ICD-10-CM) - Parkinson's disease with fluctuating manifestations, unspecified whether dyskinesia present R26.81 (ICD-10-CM) - Unsteadiness on feet R41.841 (ICD-10-CM) - Cognitive communication deficit R27.8 (ICD-10-CM) - Other lack of coordination R26.2 (ICD-10-CM) - Difficulty in walking, not elsewhere classified  THERAPY DIAG:  Unsteadiness on feet  Other symptoms and signs involving the nervous system  Muscle weakness (generalized)  Abnormal posture  Other abnormalities of gait and mobility  Parkinson's disease with fluctuating manifestations, unspecified whether dyskinesia present (HCC)  Difficulty in walking, not elsewhere classified  Rationale for Evaluation and Treatment: Rehabilitation  SUBJECTIVE:  SUBJECTIVE STATEMENT: Doing better past couple of days, ribs aren't too sore Pt accompanied by: significant other  PERTINENT HISTORY: PD, complex medical history of peptic ulcer disease with history of upper GI bleed, TIA, depression, degenerative disc disease in the neck, osteoarthritis, kidney stones, hyperlipidemia, hypertension, history of hiatal hernia, BPH, arthritis, and ankylosing spondylitis, and parkinsonism   PAIN:  Are you having pain? Various places  mid back mostly  PRECAUTIONS: Fall  RED FLAGS: None   WEIGHT BEARING RESTRICTIONS: No  FALLS: Has patient fallen in last 6 months? Yes. Number of falls unknown, multiple  LIVING ENVIRONMENT: Lives with: lives with their spouse Lives in: House/apartment Stairs: Ramp to enter, stairs to loft and basement, ground floor set-up Has following equipment  at home: [name] - 2 wheeled  PLOF: Needs assistance with ADLs, Needs assistance with homemaking, and Needs assistance with gait, supervision for ambulation at household distances  PATIENT GOALS: improve balance, walking, reduce falls  OBJECTIVE:   TODAY'S TREATMENT: 10/01/23 Activity Comments  LAQ 3x10 10#  Alt stair taps 1x20 10#, 4 box  Gait training -trials with U-step to improve step length and turn negotiation -forwards-backwards w/ U-step -trials with counted steps for greater step length              TODAY'S TREATMENT: 09/24/23 Activity Comments  Gait training W/ U-step SBA level surfaces, cues in brake function and step length. Cues for step through when negotiating turns to reduce freezing of gait  STRENGTH -seated single arm row 1x10: 10#, 20#, 25# -seated hamstring curls 1x10: 10#, 15#, 20# -seated bilat row w/ trunk flexion-extension 1x10: 20#, 25# -LAQ 3x10 5# -alt stair taps 3x10 5# -sidestepping x 2 min 5# along counter                       PATIENT EDUCATION: Education details: 09/19/2023:  Turning technique using pt's RW; discussed again benefits of U-step for improved stability with turns and potential for decreased fall risk. Person educated: Patient and Spouse Education method: Explanation, Demonstration, and Verbal cues Education comprehension: verbalized understanding and needs further education   Access Code: [email or web address] URL: [email or web address] Date: 05/08/2023 Prepared by: [facility] - Outpatient  Rehab - [facility]  Program Notes perform with wife's supervision  Exercises - Mini Squat with Counter Support  - 1 x daily - 5 x weekly - 2 sets - 10 reps - Staggered Stance Forward Backward Weight Shift with Counter Support  - 1 x daily - 5 x weekly - 2 sets - 10 reps - Standing Quarter Turn with Counter Support  - 1 x daily - 5 x weekly - 2 sets - 5 reps - Seated Long Arc Quad with Ankle Weight  - 1 x daily - 5 x weekly -  3 sets - 10 reps - Seated Hip Flexion March with Ankle Weights  - 1 x daily - 5 x weekly - 3 sets - 10 reps - Seated Active Hip Flexion  - 1 x daily - 7 x weekly - 1 sets - 10 reps - 5-10 sec hold        ------------------------------------------------- Objective measures below taken at initial evaluation:  DIAGNOSTIC FINDINGS: n/a for episode  COGNITION: Overall cognitive status: History of cognitive impairments - at baseline   SENSATION: Neuropathy in bilat feet  COORDINATION: Difficulty with rapid alternating movements    MUSCLE TONE: NT   POSTURE: rounded shoulders, forward head, flexed trunk , and head down , kyphosis  LOWER EXTREMITY ROM:     Active  Right Eval Left Eval  Hip flexion    Hip extension    Hip abduction    Hip adduction    Hip internal rotation    Hip external rotation    Knee flexion 120 120  Knee extension 0 0  Ankle dorsiflexion 5 12  Ankle plantarflexion    Ankle inversion    Ankle eversion     (Blank rows = not tested)  LOWER EXTREMITY MMT:    BLE strength grossly 3+/5  BED MOBILITY:  Mod A  TRANSFERS: Assistive device utilized: Environmental Consultant - 2 wheeled  Sit to stand: CGA and Min A Stand to sit: SBA Chair to chair: SBA and CGA Floor: Mod A and Max A  RAMP:  Level of Assistance: CGA Assistive device utilized: Environmental Consultant - 2 wheeled Ramp Comments:   CURB:  Level of Assistance: CGA Assistive device utilized: Environmental Consultant - 2 wheeled Curb Comments:   STAIRS: NT  GAIT: Gait pattern: step to pattern, decreased stride length, and poor foot clearance- Right Distance walked: 100 Assistive device utilized: Walker - 2 wheeled Level of assistance: SBA and CGA Comments: level surfaces  FUNCTIONAL TESTS:  5 times sit to stand: 45 sec w/ CGA Timed up and go (TUG): 1 min 27 sec  M-CTSIB  Condition 1: Firm Surface, EO 10 Sec, Severe and retro-LOB  Sway  Condition 2: Firm Surface, EC 3 Sec, Severe and retro-LOB  Sway  Condition 3: Foam  Surface, EO  Sec,  Sway  Condition 4: Foam Surface, EC  Sec,  Sway       GOALS: Goals reviewed with patient? Yes  SHORT TERM GOALS: UPDATED Target date: 05/15/2023>09/23/2023      Patient will be independent in HEP to improve functional outcomes Baseline: has been reviewed with PT and wife previously 06/04/23 Goal status: MET 06/04/23  2.  Teach-back strategies to reduce freezing of gait during turns Baseline: 16 steps to complete 180 deg turn; unable to recall 06/04/23 Goal status: NOT MET 06/04/23  3.  Demo improved safety and independence with sit to stand completing transfers with set-up assist Baseline: CGA-min A; unchanged 06/04/23 Goal status: NOT MET 06/04/23  4.  Demo ability to perform 10 backwards steps without LOB/retropulsion to improve motor control and facilitate righting reactions  Baseline: 3-5 steps before retropulsion/LOB; 10-15 steps with U-step  Goal status: MET    LONG TERM GOALS: UPDATED Target date: 06/05/2023>06/20/2023> 07/17/2023> 10/21/2023        Demo reduced risk for falls per time 35 sec TUG test Baseline: 1 min 27 sec; 1 min 15 sec 06/04/23; 54 sec 06/19/23; 38 sec (07/16/23); (08/26/23) 2 min Goal status: IN PROGRESS   2.  Demo improved BLE strength and balance per time of 25 sec 5xSTS test to increase independence with mobility Baseline: 45 sec; 1 min 33 sec with occasional min A and consistent verbal cues 06/04/23; 1 min 18 sec 06/19/23; (07/16/23) 41 sec w/ arm rest push-off; (08/26/23) 52 sec Goal status: IN PROGRESS   3.  Demo improved safety with ambulation on level surfaces w/ supervision x 150 ft Baseline: SBA-CGA; occasional min A for balance and cueing to reduce freezing for 160 ft 06/04/23; 150 ft w/ supervision level surfaces x 2 min 45 sec; (08/26/23) CGA-SBA w/ RW Goal status: IN PROGRESS  4.  Improve gait speed to 1.5 ft/sec to improve efficiency of ambulation  Baseline: 0.9 ft/sec; 1.5 ft/sec (07/16/23); 0.7 ft/sec  (08/26/23)  Goal status: IN PROGRESS  ASSESSMENT:  CLINICAL IMPRESSION: Initiated with heavy resistance training to improve LE strength/power with cues for attention, sequence, and full ROM. Gait training trials with U-step to improve safety and stability with device to improve ability to negotiate turns and progressed to strategies for faster pace and greater stride length with reliance on visual targets with great carryover demonstrating faster pace and longer step length with strategy. Provided U-step to trial at home to see if device is feasible and practical for home use.   OBJECTIVE IMPAIRMENTS: Abnormal gait, decreased activity tolerance, decreased balance, decreased coordination, decreased mobility, difficulty walking, decreased ROM, decreased strength, improper body mechanics, and postural dysfunction.   ACTIVITY LIMITATIONS: carrying, lifting, standing, stairs, transfers, bed mobility, and locomotion level  PARTICIPATION LIMITATIONS: meal prep, cleaning, laundry, interpersonal relationship, community activity, and exercise routine  PERSONAL FACTORS: Age, Time since onset of injury/illness/exacerbation, and 3+ comorbidities: PMH  are also affecting patient's functional outcome.   REHAB POTENTIAL: Good  CLINICAL DECISION MAKING: Evolving/moderate complexity  EVALUATION COMPLEXITY: Moderate  PLAN:  PT FREQUENCY: 1x/week  PT DURATION: 8 weeks  PLANNED INTERVENTIONS: Therapeutic exercises, Therapeutic activity, Neuromuscular re-education, Balance training, Gait training, Patient/Family education, Self Care, and Joint mobilization  PLAN FOR NEXT SESSION:  ask about falls.  Ask about U-step and continue practice turns.  Freezing of gait, review gym routine/at home seated PWR moves compliance  3:25 PM, 10/01/23 M. Kelly Breta Demedeiros, PT, DPT Physical Therapist- [place] Office Number: [phone]

## 2023-10-01 NOTE — Therapy (Signed)
 OUTPATIENT OCCUPATIONAL THERAPY PARKINSON'S  Treatment Note  Patient Name: [name] MRN: [id number] DOB:05-07-1936, 88 y.o., male Today's Date: 10/01/2023  PCP: Yolande Toribio MATSU, MD REFERRING PROVIDER: Buck Saucer, MD     END OF SESSION:  OT End of Session - 10/01/23 1711     Visit Number 28    Number of Visits 32    Date for OT Re-Evaluation 10/24/23    Authorization Type Medicare A &B    Progress Note Due on Visit 30    OT Start Time 1448    OT Stop Time 1530    OT Time Calculation (min) 42 min    Equipment Utilized During Treatment gait belt    Activity Tolerance Patient tolerated treatment well    Behavior During Therapy WFL for tasks assessed/performed;Flat affect                                      Past Medical History:  Diagnosis Date   Ankylosing spondylitis (HCC) dx'd ~ 1974   Arthritis    back (08/10/2015)   BENIGN PROSTATIC HYPERTROPHY, WITH OBSTRUCTION 01/15/2010   Bleeding duodenal ulcer    Bleeding esophageal ulcer    Bleeding stomach ulcer    GERD 02/22/2008   History of blood transfusion 08/26/1986   lost ~ 1/2 of my blood volume from multiple bleeding ulcers   History of hiatal hernia    HYPERGLYCEMIA 11/18/2007   HYPERLIPIDEMIA 02/22/2008   HYPERTENSION, UNSPECIFIED 11/10/2009   Kidney stones    Osteoarthritis    c-spine   Parkinson's disease (HCC)    PEPTIC ULCER DISEASE 11/17/2007   Situational depression    son died in MVA Jun 25, 2015   TIA (transient ischemic attack)    not that I know of in the past; they are trying to determine if I've had one today (08/10/2015)   Past Surgical History:  Procedure Laterality Date   CATARACT EXTRACTION W/ INTRAOCULAR LENS  IMPLANT, BILATERAL Bilateral 2013   Patient Active Problem List   Diagnosis Date Noted   Ankylosing spondylitis of cervicothoracic region (HCC) 10/16/2016   TIA (transient ischemic attack) 08/10/2015   Carotid stenosis 08/04/2014    Hyperlipidemia 02/21/2012   BENIGN PROSTATIC HYPERTROPHY, WITH OBSTRUCTION 01/15/2010   Essential hypertension 11/10/2009   GERD 02/22/2008   NEPHROLITHIASIS, HX OF 11/17/2007    ONSET DATE: referral date 03/25/23  REFERRING DIAG: G20.A2 (ICD-10-CM) - Parkinson's disease without dyskinesia, with fluctuations R26.81 (ICD-10-CM) - Unsteadiness on feet R41.841 (ICD-10-CM) - Cognitive communication deficit R27.8 (ICD-10-CM) - Other lack of coordination R26.2 (ICD-10-CM) - Difficulty in walking, not elsewhere classified  THERAPY DIAG:  Other symptoms and signs involving the nervous system  Muscle weakness (generalized)  Unsteadiness on feet  Abnormal posture  Rationale for Evaluation and Treatment: Rehabilitation  SUBJECTIVE:   SUBJECTIVE STATEMENT: Pt reports significant improvements with mobility over the last several days, but noticed a decrease in mobility starting yesterday.  Pt's spouse reports that he had increased difficulty getting up and dressed for the day.  Pt reports hearing marbles rolling in his head and neck crunching since his most recent big fall.  Pt accompanied by: self and spouse Natale)  PERTINENT HISTORY: history of Parkinson's and ankylosing spondylitis   PRECAUTIONS: Fall  WEIGHT BEARING RESTRICTIONS: No  PAIN:  Are you having pain? Yes. 4/10 in back and neck.  FALLS: Has patient fallen in last 6 months? Yes. Number  of falls has had multiple falls over the last few months with multiple in early July, and then 2 falls this month.  07/07/23 - Pt's spouse reports that he has not had a fall since Weds. 07/09/23 - Pt reports fall between midnight and 3 AM last night when returning from using the bathroom.  09/24/23 - 2 falls about 2 weeks ago, fell backwards while attempting to stand. Went to [facility] ED though not admitted. Per 09/10/23 ED Note: Interpretation of Diagnostics CT imaging reveals 3 acute rib fractures, unclear if they are from the most recent  fall or the prior fall earlier today.  CT head and cervical spine largely unremarkable.  LIVING ENVIRONMENT: Lives with: lives with their spouse Lives in: House/apartment Stairs: Yes: External: 2-3 steps; has a ramp at entrance with raised railings Internal: steps to basement and steps to loft - however pt does not need to go up/down those at this time. Has following equipment at home: [name] - 2 wheeled, Environmental Consultant - 4 wheeled, bed side commode, Shower chair, Ramped entry, and transport chair  PLOF: Requires assistive device for independence and Needs assistance with ADLs  PATIENT GOALS: to be able to move without concern/serious concern  OBJECTIVE:   HAND DOMINANCE: Right  ADLs: Overall ADLs: Pt reports there are times that he can be fairly independent with dressing but there are times where spouse has to provide significant assistance with. Transfers/ambulation related to ADLs: Utilizing RW and will occasionally walk bouts through the house without RW.  If pt loses balance backwards he is unable to correct. Eating: fumble fingered UB Dressing: occasional assistance LB Dressing: socks and shoes are quite difficult Toileting: I go to the bathroom by myself but has difficulty getting up/down from toilet Bathing: washing at the sink, spouse will assist  Tub Shower transfers: pt feels uncomfortable in the shower with shower chair, but has not fallen Equipment: Shower seat with back and Walk in shower with 5.5 ledge with 30 door  IADLs: Light housekeeping: washes dishes when he is feeling strong, however frequent onset of weakness Meal Prep: Pt was primary cook prior to fall in 2023, but has returned to participation in aspects of meal prep Community mobility: recently renewed drivers license, but is not driving  MOBILITY STATUS: Needs Assist: CGA with mobility with RW and Hx of falls   POSTURE COMMENTS:  rounded shoulders and forward head, posterior pelvic tilt and R  lean  FUNCTIONAL OUTCOME MEASURES: Fastening/unfastening 3 buttons: unable to button any buttons in 1 mins Physical performance test: PPT#2 (simulated eating) 22.79 sec & PPT#4 (donning/doffing jacket): 1:05  COORDINATION: Box and Blocks:  Right 30 blocks, Left 32 blocks  06/09/23: R: 39 blocks, L: 38 blocks  UE ROM:  all movements are slowed, decreased shoulder flexion d/t ankylosing spondylitis   UE MMT:    grossly 4/5 overall  COGNITION: Overall cognitive status:  Spouse reports thinking continues to be slower  OBSERVATIONS: Bradykinesia   TODAY'S TREATMENT:                                                                         DATE:   10/01/23 Large amplitude: engaged in modified LSVT big exercises to carry over to decreased  burden of care with transitional movements and dressing. Pt still with good forward and downward reach when given targets to reach towards and verbal cues from therapist to further facilitate ROM. OT providing verbal cues and targets for upright posture when reaching up and out to side, utilizing upright poles for targets with open chest stretch. Pt demonstrating improved carryover and open stretch this session. NMR: engaged in simulated UB dressing/donning jacket initially with hula hoop to get cape technique with big arm movement up and around shoulders.  Utilized ship broker for visual feedback to aid in amplitude when simulating donning jacket.  Pt still with quite rigid shoulder movements, benefiting from transition to scarf and with hand over hand to facilitate smooth movements. Education/safety: OT reiterating education on large amplitude movements, big steps, and sharp turns to aid in ambulation and turning.  Pt demonstrating improvements with large steps and turning when provided with verbal cues and targets.   PATIENT EDUCATION: Education details: see treatment note above Person educated: Patient and caregiver Education method: Explanation and Verbal  cues Education comprehension: verbalized understanding and needs further education  HOME EXERCISE PROGRAM: 06/25/23 - Handouts: V/c for sit-to-stand (see 06/25/23 pt instructions), additional copy of 05/14/23 Bag Exercises (see 05/14/23 pt instructions)  LSVT big exercises  Access Code: [email or web address] URL: [email or web address] Date: 06/23/2023 Prepared by: [facility] - Outpatient  Rehab - [facility]  Exercises - Seated Figure 4 Piriformis Stretch  - 1 x daily - 10 reps - 15 sec hold - Seated Piriformis Stretch  - 1 x daily - 10 reps - 15 sec hold - Seated Single Knee to Chest  - 1 x daily - 10 reps - 5 sec hold  07/09/23 - Provided examples of no-tie lace options for LB dressing. (Handout, see pt instructions).   GOALS: Goals reviewed with patient? Yes  SHORT TERM GOALS: Target date: 09/26/23  Pt and spouse will be independent in full body HEP with focus on large amplitude movements as needed to maintain/preserve function as pertaining to ADLs/IADLs. Baseline: Goal status: IN PROGRESS  2.  Pt will demonstrate ability to complete sit > stand as needed to get up from toilet or other seating options in home by completing x3 throughout session without cues or physical assistance to maintain/preserve function. Baseline: occasionally requiring cues and/or physical assistance Goal status:  IN PROGRESS  3. Pt and spouse will verbalize understanding of tips for fall prevention.  Baseline:  Goal status:  IN PROGRESS   LONG TERM GOALS: Target date: 10/24/23  Pt will report ability to complete UB/LB dressing at Supervision/setup level with use of AE and/or alternative strategies PRN.  Baseline: Min A Goal status: IN PROGRESS  2.  Pt and spouse will report understanding of adaptive techniques/strategies and/or modifications to routines to increase safety with mobility and self-care tasks in the home.  Baseline:  Goal status: IN PROGRESS  3.  Pt will demonstrate ability  to maintain sequencing and timing with don/doff jacket to maintain in 1-1:10 minute range with use of adaptive strategies PRN to preserve function with UB dressing and don/doff jacket.  Baseline: 1:05 Goal status: IN PROGRESS  4.  Pt and spouse will be independent in updated PD specific HEP to maintain/preserve function as needed for ADLs and quality of life. Baseline: Goal status: IN PROGRESS  ASSESSMENT:  CLINICAL IMPRESSION: Pt demonstrating much improved ease with ambulation and transfers, when provided with initial cue and targets for turning, however still benefiting from minA to CGA to ensure safety.  Pt tolerated tasks well today. Pt would continue to benefit from skilled OT services to improve pt understanding of strategies to reduce fall risk, improve safety, and decrease caregiver burden.  PERFORMANCE DEFICITS: in functional skills including ADLs, IADLs, coordination, dexterity, ROM, strength, pain, flexibility, Fine motor control, Gross motor control, balance, body mechanics, endurance, decreased knowledge of precautions, decreased knowledge of use of DME, and UE functional use and psychosocial skills including coping strategies, environmental adaptation, and routines and behaviors.   IMPAIRMENTS: are limiting patient from ADLs and IADLs.    PLAN:  OT FREQUENCY: 1x/week  OT DURATION: 8 weeks  PLANNED INTERVENTIONS: self care/ADL training, therapeutic exercise, therapeutic activity, neuromuscular re-education, balance training, functional mobility training, ultrasound, compression bandaging, moist heat, cryotherapy, patient/family education, cognitive remediation/compensation, psychosocial skills training, energy conservation, coping strategies training, and DME and/or AE instructions  RECOMMENDED OTHER SERVICES: NA  CONSULTED AND AGREED WITH PLAN OF CARE: Patient  PLAN FOR NEXT SESSION:   Continue to review bag exercises for carryover to dressing Large amplitude and LSVT  big exercises - continue to review Sit > stand  Education: Safety, reducing fall risk UB and LB dressing adaptive strategies    Katherine Syme, OTR/L 10/01/2023, 5:12 PM   [facility] at [facility] [address], [address] [place], [place] [address] Phone # [phone] Fax # [phone]

## 2023-10-02 DIAGNOSIS — B351 Tinea unguium: Secondary | ICD-10-CM | POA: Diagnosis not present

## 2023-10-02 DIAGNOSIS — M79676 Pain in unspecified toe(s): Secondary | ICD-10-CM | POA: Diagnosis not present

## 2023-10-08 ENCOUNTER — Ambulatory Visit: Payer: Medicare Other | Admitting: Occupational Therapy

## 2023-10-08 ENCOUNTER — Ambulatory Visit: Payer: Medicare Other

## 2023-10-08 DIAGNOSIS — G20A2 Parkinson's disease without dyskinesia, with fluctuations: Secondary | ICD-10-CM

## 2023-10-08 DIAGNOSIS — R29818 Other symptoms and signs involving the nervous system: Secondary | ICD-10-CM

## 2023-10-08 DIAGNOSIS — R293 Abnormal posture: Secondary | ICD-10-CM | POA: Diagnosis not present

## 2023-10-08 DIAGNOSIS — R2689 Other abnormalities of gait and mobility: Secondary | ICD-10-CM | POA: Diagnosis not present

## 2023-10-08 DIAGNOSIS — M6281 Muscle weakness (generalized): Secondary | ICD-10-CM | POA: Diagnosis not present

## 2023-10-08 DIAGNOSIS — R2681 Unsteadiness on feet: Secondary | ICD-10-CM

## 2023-10-08 DIAGNOSIS — R262 Difficulty in walking, not elsewhere classified: Secondary | ICD-10-CM

## 2023-10-08 NOTE — Therapy (Signed)
OUTPATIENT PHYSICAL THERAPY NEURO TREATMENT NOTE Patient Name: [name] MRN: [id number] DOB:[birth year], 88 y.o., male Today's Date: 10/08/2023   PCP: Garlan Fillers REFERRING PROVIDER: Huston Foley, MD  END OF SESSION:  PT End of Session - 10/08/23 1536     Visit Number 31    Number of Visits 34    Date for PT Re-Evaluation 10/21/23    Authorization Type Medicare/[employer]    Progress Note Due on Visit 36    PT Start Time 1535    PT Stop Time 1615    PT Time Calculation (min) 40 min    Equipment Utilized During Treatment Gait belt    Activity Tolerance Patient tolerated treatment well    Behavior During Therapy WFL for tasks assessed/performed                            Past Medical History:  Diagnosis Date   Ankylosing spondylitis (HCC) dx'd ~ 1974   Arthritis    "back" (08/10/2015)   BENIGN PROSTATIC HYPERTROPHY, WITH OBSTRUCTION 01/15/2010   Bleeding duodenal ulcer    Bleeding esophageal ulcer    Bleeding stomach ulcer    GERD 02/22/2008   History of blood transfusion 08/26/1986   "lost ~ 1/2 of my blood volume from multiple bleeding ulcers"   History of hiatal hernia    HYPERGLYCEMIA 11/18/2007   HYPERLIPIDEMIA 02/22/2008   HYPERTENSION, UNSPECIFIED 11/10/2009   Kidney stones    Osteoarthritis    c-spine   Parkinson's disease (HCC)    PEPTIC ULCER DISEASE 11/17/2007   Situational depression    "son died in MVA June 14, 2015"   TIA (transient ischemic attack)    "not that I know of in the past; they are trying to determine if I've had one today (08/10/2015)"   Past Surgical History:  Procedure Laterality Date   CATARACT EXTRACTION W/ INTRAOCULAR LENS  IMPLANT, BILATERAL Bilateral 2013   Patient Active Problem List   Diagnosis Date Noted   Ankylosing spondylitis of cervicothoracic region (HCC) 10/16/2016   TIA (transient ischemic attack) 08/10/2015   Carotid stenosis 08/04/2014   Hyperlipidemia 02/21/2012   BENIGN PROSTATIC HYPERTROPHY,  WITH OBSTRUCTION 01/15/2010   Essential hypertension 11/10/2009   GERD 02/22/2008   NEPHROLITHIASIS, HX OF 11/17/2007    ONSET DATE: "several years"  REFERRING DIAG: G20.A2 (ICD-10-CM) - Parkinson's disease with fluctuating manifestations, unspecified whether dyskinesia present R26.81 (ICD-10-CM) - Unsteadiness on feet R41.841 (ICD-10-CM) - Cognitive communication deficit R27.8 (ICD-10-CM) - Other lack of coordination R26.2 (ICD-10-CM) - Difficulty in walking, not elsewhere classified  THERAPY DIAG:  Other symptoms and signs involving the nervous system  Muscle weakness (generalized)  Unsteadiness on feet  Abnormal posture  Other abnormalities of gait and mobility  Parkinson's disease with fluctuating manifestations, unspecified whether dyskinesia present (HCC)  Difficulty in walking, not elsewhere classified  Rationale for Evaluation and Treatment: Rehabilitation  SUBJECTIVE:  SUBJECTIVE STATEMENT: Doing better past couple of days, ribs aren't too sore Pt accompanied by: significant other  PERTINENT HISTORY: PD, complex medical history of peptic ulcer disease with history of upper GI bleed, TIA, depression, degenerative disc disease in the neck, osteoarthritis, kidney stones, hyperlipidemia, hypertension, history of hiatal hernia, BPH, arthritis, and ankylosing spondylitis, and parkinsonism   PAIN:  Are you having pain? "Various places"  mid back mostly  PRECAUTIONS: Fall  RED FLAGS: None   WEIGHT BEARING RESTRICTIONS: No  FALLS: Has patient fallen in last 6 months? Yes. Number of falls unknown, multiple  LIVING ENVIRONMENT: Lives with: lives with their spouse Lives in: House/apartment Stairs: Ramp to enter, stairs to loft and basement, ground floor set-up Has following equipment  at home: Dan Humphreys - 2 wheeled  PLOF: Needs assistance with ADLs, Needs assistance with homemaking, and Needs assistance with gait, supervision for ambulation at household distances  PATIENT GOALS: improve balance, walking, reduce falls  OBJECTIVE:   TODAY'S TREATMENT: 10/08/23 Activity Comments  Discussion regarding trial with U-step in home environment. Discussed/identified barriers of use.    Gait training Use of visual targets for stride length  Motor control -lateral step and reach--difficulty sequencing single large step, requiring elimination of 2-step direction and tactile cues to promote single large step -forward step and weight shift -balance on airex              TODAY'S TREATMENT: 10/01/23 Activity Comments  LAQ 3x10 10#  Alt stair taps 1x20 10#, 4" box  Gait training -trials with U-step to improve step length and turn negotiation -forwards-backwards w/ U-step -trials with counted steps for greater step length                PATIENT EDUCATION: Education details: 09/19/2023:  Turning technique using pt's RW; discussed again benefits of U-step for improved stability with turns and potential for decreased fall risk. Person educated: Patient and Spouse Education method: Explanation, Demonstration, and Verbal cues Education comprehension: verbalized understanding and needs further education   Access Code: [email or web address] URL: [email or web address] Date: 05/08/2023 Prepared by: [facility] - [facility]  Program Notes perform with wife's supervision  Exercises - Mini Squat with Counter Support  - 1 x daily - 5 x weekly - 2 sets - 10 reps - Staggered Stance Forward Backward Weight Shift with Counter Support  - 1 x daily - 5 x weekly - 2 sets - 10 reps - Standing Quarter Turn with Counter Support  - 1 x daily - 5 x weekly - 2 sets - 5 reps - Seated Long Arc Quad with Ankle Weight  - 1 x daily - 5 x weekly - 3 sets - 10 reps - Seated Hip  Flexion March with Ankle Weights  - 1 x daily - 5 x weekly - 3 sets - 10 reps - Seated Active Hip Flexion  - 1 x daily - 7 x weekly - 1 sets - 10 reps - 5-10 sec hold        ------------------------------------------------- Objective measures below taken at initial evaluation:  DIAGNOSTIC FINDINGS: n/a for episode  COGNITION: Overall cognitive status: History of cognitive impairments - at baseline   SENSATION: Neuropathy in bilat feet  COORDINATION: Difficulty with rapid alternating movements    MUSCLE TONE: NT   POSTURE: rounded shoulders, forward head, flexed trunk , and head down , kyphosis  LOWER EXTREMITY ROM:     Active  Right Eval Left Eval  Hip flexion  Hip extension    Hip abduction    Hip adduction    Hip internal rotation    Hip external rotation    Knee flexion 120 120  Knee extension 0 0  Ankle dorsiflexion 5 12  Ankle plantarflexion    Ankle inversion    Ankle eversion     (Blank rows = not tested)  LOWER EXTREMITY MMT:    BLE strength grossly 3+/5  BED MOBILITY:  Mod A  TRANSFERS: Assistive device utilized: Environmental consultant - 2 wheeled  Sit to stand: CGA and Min A Stand to sit: SBA Chair to chair: SBA and CGA Floor: Mod A and Max A  RAMP:  Level of Assistance: CGA Assistive device utilized: Environmental consultant - 2 wheeled Ramp Comments:   CURB:  Level of Assistance: CGA Assistive device utilized: Environmental consultant - 2 wheeled Curb Comments:   STAIRS: NT  GAIT: Gait pattern: step to pattern, decreased stride length, and poor foot clearance- Right Distance walked: 100 Assistive device utilized: Walker - 2 wheeled Level of assistance: SBA and CGA Comments: level surfaces  FUNCTIONAL TESTS:  5 times sit to stand: 45 sec w/ CGA Timed up and go (TUG): 1 min 27 sec  M-CTSIB  Condition 1: Firm Surface, EO 10 Sec, Severe and retro-LOB  Sway  Condition 2: Firm Surface, EC 3 Sec, Severe and retro-LOB  Sway  Condition 3: Foam Surface, EO  Sec,  Sway   Condition 4: Foam Surface, EC  Sec,  Sway       GOALS: Goals reviewed with patient? Yes  SHORT TERM GOALS: UPDATED Target date: 05/15/2023>09/23/2023      Patient will be independent in HEP to improve functional outcomes Baseline: has been reviewed with PT and wife previously 06/04/23 Goal status: MET 06/04/23  2.  Teach-back strategies to reduce freezing of gait during turns Baseline: 16 steps to complete 180 deg turn; unable to recall 06/04/23 Goal status: NOT MET 06/04/23  3.  Demo improved safety and independence with sit to stand completing transfers with set-up assist Baseline: CGA-min A; unchanged 06/04/23 Goal status: NOT MET 06/04/23  4.  Demo ability to perform 10 backwards steps without LOB/retropulsion to improve motor control and facilitate righting reactions  Baseline: 3-5 steps before retropulsion/LOB; 10-15 steps with U-step  Goal status: MET    LONG TERM GOALS: UPDATED Target date: 06/05/2023>06/20/2023> 07/17/2023> 10/21/2023        Demo reduced risk for falls per time 35 sec TUG test Baseline: 1 min 27 sec; 1 min 15 sec 06/04/23; 54 sec 06/19/23; 38 sec (07/16/23); (08/26/23) 2 min Goal status: IN PROGRESS   2.  Demo improved BLE strength and balance per time of 25 sec 5xSTS test to increase independence with mobility Baseline: 45 sec; 1 min 33 sec with occasional min A and consistent verbal cues 06/04/23; 1 min 18 sec 06/19/23; (07/16/23) 41 sec w/ arm rest push-off; (08/26/23) 52 sec Goal status: IN PROGRESS   3.  Demo improved safety with ambulation on level surfaces w/ supervision x 150 ft Baseline: SBA-CGA; occasional min A for balance and cueing to reduce freezing for 160 ft 06/04/23; 150 ft w/ supervision level surfaces x 2 min 45 sec; (08/26/23) CGA-SBA w/ RW Goal status: IN PROGRESS  4.  Improve gait speed to 1.5 ft/sec to improve efficiency of ambulation  Baseline: 0.9 ft/sec; 1.5 ft/sec (07/16/23); 0.7 ft/sec (08/26/23)  Goal status: IN  PROGRESS  ASSESSMENT:  CLINICAL IMPRESSION: Reports improved stability with gait using U-step walker in  household environment but limitation being bulk of device not allowing navigation throughout home. Session focus on improved coordination and motor control ultimately unable to coordinate 2-step directions requiring heavy cues for correction and even blocking/tactile assist to facilitate.  Use of visual targets to guide/sequence with limited success. Reinforced techniques to promote greater stride length and larger amplitude movement to enhance coordination. Unable to maintain balance on compliant surface any length of time before retro-LOB and absent righting reactions. Reinforced to caregiver of benefit of performing rigorous cardiovascular exercise at fitness facility is likely the most impactful strategy at this time.  OBJECTIVE IMPAIRMENTS: Abnormal gait, decreased activity tolerance, decreased balance, decreased coordination, decreased mobility, difficulty walking, decreased ROM, decreased strength, improper body mechanics, and postural dysfunction.   ACTIVITY LIMITATIONS: carrying, lifting, standing, stairs, transfers, bed mobility, and locomotion level  PARTICIPATION LIMITATIONS: meal prep, cleaning, laundry, interpersonal relationship, community activity, and exercise routine  PERSONAL FACTORS: Age, Time since onset of injury/illness/exacerbation, and 3+ comorbidities: PMH  are also affecting patient's functional outcome.   REHAB POTENTIAL: Good  CLINICAL DECISION MAKING: Evolving/moderate complexity  EVALUATION COMPLEXITY: Moderate  PLAN:  PT FREQUENCY: 1x/week  PT DURATION: 8 weeks  PLANNED INTERVENTIONS: Therapeutic exercises, Therapeutic activity, Neuromuscular re-education, Balance training, Gait training, Patient/Family education, Self Care, and Joint mobilization  PLAN FOR NEXT SESSION:  ask about falls.  Ask about U-step and continue practice turns.  Freezing of gait,  review gym routine/at home seated PWR moves compliance  3:37 PM, 10/08/23 M. Shary Decamp, PT, DPT Physical Therapist- [place] Office Number: [phone]

## 2023-10-08 NOTE — Therapy (Unsigned)
OUTPATIENT OCCUPATIONAL THERAPY PARKINSON'S  Treatment Note  Patient Name: [name] MRN: [id number] DOB:1936-06-16, 88 y.o., male Today's Date: 10/09/2023  PCP: Garlan Fillers, MD REFERRING PROVIDER: Huston Foley, MD     END OF SESSION:  OT End of Session - 10/08/23 1807     Visit Number 29    Number of Visits 32    Date for OT Re-Evaluation 10/24/23    Authorization Type Medicare A &B    Progress Note Due on Visit 30    OT Start Time 1450    OT Stop Time 1530    OT Time Calculation (min) 40 min    Equipment Utilized During Treatment gait belt    Activity Tolerance Patient tolerated treatment well    Behavior During Therapy WFL for tasks assessed/performed;Flat affect                                       Past Medical History:  Diagnosis Date   Ankylosing spondylitis (HCC) dx'd ~ 1974   Arthritis    "back" (08/10/2015)   BENIGN PROSTATIC HYPERTROPHY, WITH OBSTRUCTION 01/15/2010   Bleeding duodenal ulcer    Bleeding esophageal ulcer    Bleeding stomach ulcer    GERD 02/22/2008   History of blood transfusion 08/26/1986   "lost ~ 1/2 of my blood volume from multiple bleeding ulcers"   History of hiatal hernia    HYPERGLYCEMIA 11/18/2007   HYPERLIPIDEMIA 02/22/2008   HYPERTENSION, UNSPECIFIED 11/10/2009   Kidney stones    Osteoarthritis    c-spine   Parkinson's disease (HCC)    PEPTIC ULCER DISEASE 11/17/2007   Situational depression    "son died in MVA 24-Jun-2015"   TIA (transient ischemic attack)    "not that I know of in the past; they are trying to determine if I've had one today (08/10/2015)"   Past Surgical History:  Procedure Laterality Date   CATARACT EXTRACTION W/ INTRAOCULAR LENS  IMPLANT, BILATERAL Bilateral 2013   Patient Active Problem List   Diagnosis Date Noted   Ankylosing spondylitis of cervicothoracic region (HCC) 10/16/2016   TIA (transient ischemic attack) 08/10/2015   Carotid stenosis 08/04/2014    Hyperlipidemia 02/21/2012   BENIGN PROSTATIC HYPERTROPHY, WITH OBSTRUCTION 01/15/2010   Essential hypertension 11/10/2009   GERD 02/22/2008   NEPHROLITHIASIS, HX OF 11/17/2007    ONSET DATE: referral date 03/25/23  REFERRING DIAG: G20.A2 (ICD-10-CM) - Parkinson's disease without dyskinesia, with fluctuations R26.81 (ICD-10-CM) - Unsteadiness on feet R41.841 (ICD-10-CM) - Cognitive communication deficit R27.8 (ICD-10-CM) - Other lack of coordination R26.2 (ICD-10-CM) - Difficulty in walking, not elsewhere classified  THERAPY DIAG:  Other symptoms and signs involving the nervous system  Muscle weakness (generalized)  Unsteadiness on feet  Abnormal posture  Rationale for Evaluation and Treatment: Rehabilitation  SUBJECTIVE:   SUBJECTIVE STATEMENT: Pt reports no "big falls" did have a near fall where he landed on box of kitty litter next to commode but did not go to the floor.   Pt accompanied by: self and spouse [name])  PERTINENT HISTORY: history of Parkinson's and ankylosing spondylitis   PRECAUTIONS: Fall  WEIGHT BEARING RESTRICTIONS: No  PAIN:  Are you having pain? Yes. 4/10 in back "as usual".  FALLS: Has patient fallen in last 6 months? Yes. Number of falls has had multiple falls over the last few months with multiple in early July, and then 2 falls this month.  07/07/23 - Pt's spouse reports that he has not had a fall since Weds. 07/09/23 - Pt reports fall between midnight and 3 AM last night when returning from using the bathroom.  09/24/23 - 2 falls about 2 weeks ago, fell backwards while attempting to stand. Went to [facility] ED though not admitted. Per 09/10/23 ED Note: Interpretation of Diagnostics CT imaging reveals 3 acute rib fractures, unclear if they are from the most recent fall or the prior fall earlier today.  CT head and cervical spine largely unremarkable.  LIVING ENVIRONMENT: Lives with: lives with their spouse Lives in: House/apartment Stairs: Yes:  External: 2-3 steps; has a ramp at entrance with raised railings Internal: steps to basement and steps to loft - however pt does not need to go up/down those at this time. Has following equipment at home: Dan Humphreys - 2 wheeled, Environmental consultant - 4 wheeled, bed side commode, Shower chair, Ramped entry, and transport chair  PLOF: Requires assistive device for independence and Needs assistance with ADLs  PATIENT GOALS: to be able to move without concern/serious concern  OBJECTIVE:   HAND DOMINANCE: Right  ADLs: Overall ADLs: Pt reports there are times that he can be "fairly independent" with dressing but there are times where spouse has to provide significant assistance with. Transfers/ambulation related to ADLs: Utilizing RW and will occasionally walk bouts through the house without RW.  If pt loses balance backwards he is unable to correct. Eating: "fumble fingered" UB Dressing: occasional assistance LB Dressing: socks and shoes are quite difficult Toileting: "I go to the bathroom by myself" but has difficulty getting up/down from toilet Bathing: washing at the sink, spouse will assist  Tub Shower transfers: pt feels uncomfortable in the shower with shower chair, but has not fallen Equipment: Shower seat with back and Walk in shower with 5.5" ledge with 30" door  IADLs: Light housekeeping: washes dishes when he is feeling strong, however frequent onset of weakness Meal Prep: Pt was primary cook prior to fall in 2023, but has returned to participation in aspects of meal prep Community mobility: recently renewed drivers license, but is not driving  MOBILITY STATUS: Needs Assist: CGA with mobility with RW and Hx of falls   POSTURE COMMENTS:  rounded shoulders and forward head, posterior pelvic tilt and R lean  FUNCTIONAL OUTCOME MEASURES: Fastening/unfastening 3 buttons: unable to button any buttons in 1 mins Physical performance test: PPT#2 (simulated eating) 22.79 sec & PPT#4 (donning/doffing  jacket): 1:05  COORDINATION: Box and Blocks:  Right 30 blocks, Left 32 blocks  06/09/23: R: 39 blocks, L: 38 blocks  UE ROM:  all movements are slowed, decreased shoulder flexion d/t ankylosing spondylitis   UE MMT:    grossly 4/5 overall  COGNITION: Overall cognitive status:  Spouse reports thinking continues to be slower  OBSERVATIONS: Bradykinesia   TODAY'S TREATMENT:                                                                         DATE:   10/08/23 Functional transfers: Engaged in ambulatory transfers to/from various seats in the clinic with focus on large amplitude movements and turning in right angles instead of small shuffling turns to decrease instances of small steps and heels  catching on each other.  OT then incorporating turns around cones and turning to sit on to various seats to simulate maneuvering in home as pt with greater challenges with small spaces and turns over straightaways.  OT providing cues for large steps and large turns and providing CGA with ambulation and turns.  When backing up, pt with tendency to have significantly smaller steps and veering to L when backing up - yielding in increased possibility of missing target. Functional reach: engaged in dynamic standing and reach to facilitate increased upright posture and amplitude with overhead reach.  Utilizing resistance clothespins with pt reaching to mat table at side and then up vertical dowel with pt demonstrating improved upright posture with vertical reaching.  Completed bilaterally with improved reach with R> L UE.    10/01/23 Large amplitude: engaged in modified LSVT big exercises to carry over to decreased burden of care with transitional movements and dressing. Pt still with good forward and downward reach when given targets to reach towards and verbal cues from therapist to further facilitate ROM. OT providing verbal cues and targets for upright posture when reaching up and out to side, utilizing  upright poles for targets with open chest stretch. Pt demonstrating improved carryover and open stretch this session. NMR: engaged in simulated UB dressing/donning jacket initially with hula hoop to get "cape" technique with big arm movement up and around shoulders.  Utilized Ship broker for visual feedback to aid in amplitude when simulating donning jacket.  Pt still with quite rigid shoulder movements, benefiting from transition to scarf and with hand over hand to facilitate smooth movements. Education/safety: OT reiterating education on large amplitude movements, big steps, and sharp turns to aid in ambulation and turning.  Pt demonstrating improvements with large steps and turning when provided with verbal cues and targets.   PATIENT EDUCATION: Education details: see treatment note above Person educated: Patient and caregiver Education method: Explanation and Verbal cues Education comprehension: verbalized understanding and needs further education  HOME EXERCISE PROGRAM: 06/25/23 - Handouts: V/c for sit-to-stand (see 06/25/23 pt instructions), additional copy of 05/14/23 "Bag Exercises" (see 05/14/23 pt instructions)  LSVT big exercises  Access Code: [email or web address] URL: [email or web address] Date: 06/23/2023 Prepared by: [facility] Outpatient  Re[facility] - [facility]  Exercises - Seated Figure 4 Piriformis Stretch  - 1 x daily - 10 reps - 15 sec hold - Seated Piriformis Stretch  - 1 x daily - 10 reps - 15 sec hold - Seated Single Knee to Chest  - 1 x daily - 10 reps - 5 sec hold  07/09/23 - Provided examples of no-tie lace options for LB dressing. (Handout, see pt instructions).   GOALS: Goals reviewed with patient? Yes  SHORT TERM GOALS: Target date: 09/26/23  Pt and spouse will be independent in full body HEP with focus on large amplitude movements as needed to maintain/preserve function as pertaining to ADLs/IADLs. Baseline: Goal status: IN PROGRESS  2.  Pt will  demonstrate ability to complete sit > stand as needed to get up from toilet or other seating options in home by completing x3 throughout session without cues or physical assistance to maintain/preserve function. Baseline: occasionally requiring cues and/or physical assistance Goal status:  IN PROGRESS  3. Pt and spouse will verbalize understanding of tips for fall prevention.  Baseline:  Goal status:  IN PROGRESS   LONG TERM GOALS: Target date: 10/24/23  Pt will report ability to complete UB/LB dressing at Supervision/setup level with use of AE and/or  alternative strategies PRN.  Baseline: Min A Goal status: IN PROGRESS  2.  Pt and spouse will report understanding of adaptive techniques/strategies and/or modifications to routines to increase safety with mobility and self-care tasks in the home.  Baseline:  Goal status: IN PROGRESS  3.  Pt will demonstrate ability to maintain sequencing and timing with don/doff jacket to maintain in 1-1:10 minute range with use of adaptive strategies PRN to preserve function with UB dressing and don/doff jacket.  Baseline: 1:05 Goal status: IN PROGRESS  4.  Pt and spouse will be independent in updated PD specific HEP to maintain/preserve function as needed for ADLs and quality of life. Baseline: Goal status: IN PROGRESS  ASSESSMENT:  CLINICAL IMPRESSION: Pt demonstrating much improved ease with ambulation and transfers when provided with initial cue and targets for turning, utilizing "right angle turns" over small curved turns.  Pt does still benefit form CGA to ensure safety.  Pt with frequent overshooting when approaching seat, especially when backing up requiring increased cues - yielding falls such as the one that happed at home today prior to therapy session.  Pt demonstrating improved upright posture this session and with improved overhead reach.  Pt would continue to benefit from skilled OT services to improve pt understanding of strategies to  reduce fall risk, improve safety, and decrease caregiver burden.  PERFORMANCE DEFICITS: in functional skills including ADLs, IADLs, coordination, dexterity, ROM, strength, pain, flexibility, Fine motor control, Gross motor control, balance, body mechanics, endurance, decreased knowledge of precautions, decreased knowledge of use of DME, and UE functional use and psychosocial skills including coping strategies, environmental adaptation, and routines and behaviors.   IMPAIRMENTS: are limiting patient from ADLs and IADLs.    PLAN:  OT FREQUENCY: 1x/week  OT DURATION: 8 weeks  PLANNED INTERVENTIONS: self care/ADL training, therapeutic exercise, therapeutic activity, neuromuscular re-education, balance training, functional mobility training, ultrasound, compression bandaging, moist heat, cryotherapy, patient/family education, cognitive remediation/compensation, psychosocial skills training, energy conservation, coping strategies training, and DME and/or AE instructions  RECOMMENDED OTHER SERVICES: NA  CONSULTED AND AGREED WITH PLAN OF CARE: Patient  PLAN FOR NEXT SESSION:   Continue to review bag exercises for carryover to dressing Large amplitude and LSVT big exercises - continue to review Sit > stand  Education: Safety, reducing fall risk UB and LB dressing adaptive strategies    Mikyla Schachter, OTR/L 10/09/2023, 8:08 AM   [facility] at [facility] [address], [address] [place], [place] [address] Phone # [phone] Fax # [phone]

## 2023-10-14 DIAGNOSIS — S61411A Laceration without foreign body of right hand, initial encounter: Secondary | ICD-10-CM | POA: Diagnosis not present

## 2023-10-15 ENCOUNTER — Ambulatory Visit: Payer: Medicare Other

## 2023-10-15 ENCOUNTER — Ambulatory Visit: Payer: Medicare Other | Admitting: Occupational Therapy

## 2023-10-15 NOTE — Progress Notes (Signed)
 Chief Complaint  Patient presents with   Follow-up    Pt in 1  with wife Pt here for PD/memory f/u wife states 10 + falls in past 6 months Wife states has increased weakness.Wife and wife states increased nightmares and hallucinations Pt states increased anxiety      HISTORY OF PRESENT ILLNESS:  10/20/23 ALL:  Gerrod returns for follow up for PD. He was last seen by Dr Frances Furbish 03/2023 following hospitalization for recurrent falls, cognitive decline and hallucinations. He was referred to PT/OT and HH. Carb-levo 1 tablet five times daily continued. Since, he continues to have progressive symptoms of PD. He has had multiple falls since last visit. Last fall 09/10/2023 resulted in a head injury requiring suture in ER. He had been seen the day prior with another fall where he fractured a rib. He continues PT/OT. HH deferred. U Step walker seemed to help with stability. Order needed. Sialorrhea continues to be bothersome. He denies difficulty swallowing. Eating normally. He is sleeping more. He does continue to describe visual hallucinations as well as some delusional thoughts. He is bothered by these as he is afraid others will think he is crazy. He has not used CPAP recently for OSA due to head injury.   04/10/2023 SA:  He reports feeling weaker overall.  He has had falls.  He has had instances where he could not stop his movement as he fell backwards.  He tries to walk around in the house on a regular basis, sometimes he does not use his 2 wheeled walker.  He usually gets 4 doses of Sinemet and on a regular day, sometimes all 5 doses.  He has had more drooling, he has occasional visual hallucinations but nothing alarming per se.    He presented to the emergency room on 03/03/2023 with recurrent falls as well as hallucinations.  I reviewed the emergency room records.  He had a head CT without contrast and cervical spine CT without contrast on 03/03/2023 and I reviewed the report: Impression: No evidence of  acute intracranial or cervical spine injury.  He also had a CT of the chest, abdomen and pelvis with contrast on 03/03/2023 and I reviewed the results:   IMPRESSION: 1. No evidence of acute traumatic injury to the chest, abdomen, or pelvis. 2. Mild inferior endplate compression fracture of T3 has a chronic appearance. 3.  Aortic Atherosclerosis (ICD10-I70.0).   He had a CT of the lumbar spine without contrast on 03/03/2023 and I reviewed the results: Impression: 1. No acute/traumatic lumbar spine pathology. 2. Bilateral L5 pars defects. No listhesis.    Laboratory testing showed mildly elevated BUN at 27 on chemistry profile, otherwise benign findings on CMP, CK level 112, urinalysis negative for UTI, CBC with differential showed below normal RBC at 3.7, hemoglobin below normal at 12.5, hematocrit below normal at 37.3, MCV borderline above normal at 100.5.  10/24/2022 ALL:  ELIZAR ALPERN is a 88 y.o. male here today for follow up for PD. He was last seen by Dr Frances Furbish 06/2022 and advised to continue carb/levo 1 tablet five times daily. He was encouraged to resume autoPAP. Since, he has continue to work with PT/OT twice daily. He feels that it is helping with strength. He does report multiple falls, the majority while getting PT. He feels that he is getting around home pretty well. He is using his walker most all times. He has not needed as much assistance with ADLs. He does admit to furniture surfing.  He eats normally. He denies trouble swallowing foods or liquids but does continue to have drooling. He is scheduled to see speech therapy in 2 weeks. He is sleeping well. Memory is stable. He will occasionally forget some details but has not seemed overly bothersome.   He reports trying to resume autoPAP therapy but has not been able to tolerate mask and headgear due to tenderness of scalp following skin biopsies. He is willing to try a different mask/headgear. He was previously with [facility] and needs  new DME.   HISTORY (copied from Dr Teofilo Pod previous note)  Mr. Baria is an 88 year old right-handed gentleman with an underlying complex medical history of peptic ulcer disease with history of upper GI bleed, TIA, depression, degenerative disc disease in the neck, osteoarthritis, kidney stones, hyperlipidemia, hypertension, history of hiatal hernia, BPH, arthritis, and ankylosing spondylitis, and parkinsonism, who presents for follow-up of his OSA and parkinsonism. The patient is accompanied by his wife again today and presents for a sooner than scheduled appointment after a recent fall with injury.  I last saw him on 01/24/2022, at which time he was compliant with his AutoPap therapy, residual AHI was still elevated and he was still having trouble with mask seal.  He was using Sinemet 4 times a day.   Today, 07/03/2022: He reports feeling okay, reports feeling weak and having difficulty standing and walking at times, he has had trouble with nighttime urination.  He is currently sleeping in a recliner.  He has not been able to use his AutoPap because of the forehead laceration and healing scar which is bothered by the headgear.  His wife provides additional details of his history and reports that since his fall in mid October he has had mobility decline, needing more help with activities of daily living including dressing which was previously independent.  He uses a 2 wheeled walker but sometimes has trouble standing.  He has fallen in the past few weeks a few times, no serious injuries with the exception of the forehead laceration last month.  He had follow-up at the primary care for the stitches, they did not check him for his frequent urination but he did have an urgent care visit and was checked for urinary tract infection which was negative per wife.  She would like for him to see a urologist.  She would like for him to have PT and OT.   Of note, he presented to the emergency room on 06/06/2022 after a  fall, he sustained a scalp 3 through the right side, needing stitches.  I reviewed the emergency room records from [name], ED department from 06/06/2022.  He had a head CT without contrast and cervical spine CT without contrast on 06/06/2022 and I reviewed the results:   IMPRESSION: 1. No acute intracranial process. 2. No acute osseous injury in the cervical spine.   REVIEW OF SYSTEMS: Out of a complete 14 system review of symptoms, the patient complains only of the following symptoms, imbalance, difficulty controlling saliva, tremor, daytime sleepiness and all other reviewed systems are negative.   ALLERGIES: No Known Allergies   HOME MEDICATIONS: Outpatient Medications Prior to Visit  Medication Sig Dispense Refill   aspirin 81 MG tablet Take 81 mg by mouth daily.       atorvastatin (LIPITOR) 10 MG tablet TAKE 1 TABLET BY MOUTH DAILY (Patient taking differently: Take 10 mg by mouth at bedtime.) 90 tablet 3   carbidopa-levodopa (SINEMET IR) 25-100 MG tablet TAKE 1 TABLET  BY MOUTH 5 TIMES  DAILY (AT 6 AM, 10 AM, 2 PM, 6  PM, 9 PM DAILY ) 450 tablet 0   cholecalciferol (VITAMIN D3) 25 MCG (1000 UNIT) tablet Take 1,000 Units by mouth daily.     levothyroxine (SYNTHROID) 50 MCG tablet Take 50 mcg by mouth daily before breakfast.     losartan-hydrochlorothiazide (HYZAAR) 50-12.5 MG tablet TAKE ONE-HALF TABLET BY MOUTH  DAILY 15 tablet 11   Multiple Vitamin (MULTIVITAMIN WITH MINERALS) TABS tablet Take 1 tablet by mouth daily with breakfast.     pantoprazole (PROTONIX) 20 MG tablet Take 20 mg by mouth at bedtime.     PRESCRIPTION MEDICATION BiPAP- At bedtime     Secukinumab (COSENTYX [place]) Inject into the vein every 30 (thirty) days.     sulindac (CLINORIL) 150 MG tablet Take 150 mg by mouth at bedtime.     SYSTANE COMPLETE PF 0.6 % SOLN Place 1 drop into both eyes 4 (four) times daily.     lidocaine (LIDODERM) 5 % Place 1 patch onto the skin daily. Remove & Discard patch within 12 hours or  as directed by MD 5 patch 0   oxyCODONE (ROXICODONE) 5 MG immediate release tablet Take 0.5 tablets (2.5 mg total) by mouth every 4 (four) hours as needed. 15 tablet 0   No facility-administered medications prior to visit.     PAST MEDICAL HISTORY: Past Medical History:  Diagnosis Date   Ankylosing spondylitis (HCC) dx'd ~ 1974   Arthritis    "back" (08/10/2015)   BENIGN PROSTATIC HYPERTROPHY, WITH OBSTRUCTION 01/15/2010   Bleeding duodenal ulcer    Bleeding esophageal ulcer    Bleeding stomach ulcer    GERD 02/22/2008   History of blood transfusion 08/26/1986   "lost ~ 1/2 of my blood volume from multiple bleeding ulcers"   History of hiatal hernia    HYPERGLYCEMIA 11/18/2007   HYPERLIPIDEMIA 02/22/2008   HYPERTENSION, UNSPECIFIED 11/10/2009   Kidney stones    Osteoarthritis    c-spine   Parkinson's disease (HCC)    PEPTIC ULCER DISEASE 11/17/2007   Situational depression    "son died in MVA 06-22-2015"   TIA (transient ischemic attack)    "not that I know of in the past; they are trying to determine if I've had one today (08/10/2015)"     PAST SURGICAL HISTORY: Past Surgical History:  Procedure Laterality Date   CATARACT EXTRACTION W/ INTRAOCULAR LENS  IMPLANT, BILATERAL Bilateral 2013     FAMILY HISTORY: Family History  Problem Relation Age of Onset   Appendicitis Mother        complications   Colon cancer Father    Diabetes type II Father    Lupus Sister    Colon cancer Other        1st degree relative <60   Parkinson's disease Neg Hx      SOCIAL HISTORY: Social History   Socioeconomic History   Marital status: Married    Spouse name: Not on file   Number of children: Not on file   Years of education: Not on file   Highest education level: Not on file  Occupational History   Not on file  Tobacco Use   Smoking status: Former    Types: Cigars, Pipe    Quit date: 08/26/1978    Years since quitting: 45.1   Smokeless tobacco: Never  Vaping Use    Vaping status: Never Used  Substance and Sexual Activity   Alcohol use: Yes  Comment: 08/10/2015 "glass of wine ~ month or so; occasionally a can of beer"   Drug use: No   Sexual activity: Yes  Other Topics Concern   Not on file  Social History Narrative   Retired and married.    Social Drivers of Corporate investment banker Strain: Not on file  Food Insecurity: No Food Insecurity (05/30/2023)   Hunger Vital Sign    Worried About Running Out of Food in the Last Year: Never true    Ran Out of Food in the Last Year: Never true  Transportation Needs: No Transportation Needs (05/30/2023)   PRAPARE - Administrator, Civil Service (Medical): No    Lack of Transportation (Non-Medical): No  Physical Activity: Not on file  Stress: Not on file  Social Connections: Not on file  Intimate Partner Violence: Not on file     PHYSICAL EXAM  Vitals:   10/20/23 1447  BP: 119/66  Pulse: 85  SpO2: 96%  Weight: 156 lb (70.8 kg)  Height: 5\' 7"  (1.702 m)    Body mass index is 24.43 kg/m.  Generalized: Well developed, in no acute distress, sialorrhea noted   Cardiology: normal rate and rhythm, no murmur auscultated  Respiratory: clear to auscultation bilaterally    Neurological examination  Mentation: Alert oriented to time, place, history taking. Follows all commands speech and language fluent Cranial nerve II-XII: Pupils were equal round reactive to light. Extraocular movements were full, visual field were full on confrontational test. Facial sensation and strength were normal. Uvula tongue midline. Head turning and shoulder shrug  were normal and symmetric. Motor: The motor testing reveals 4 over 5 strength of all 4 extremities. Global weakness. Left hand tremor  Sensory: Sensory testing is intact to soft touch on all 4 extremities. No evidence of extinction is noted.  Gait and station: Unable to walk, today due to instability. In wheelchair.  Reflexes: Deep tendon reflexes  are symmetric and normal bilaterally.    DIAGNOSTIC DATA (LABS, IMAGING, TESTING) - I reviewed patient records, labs, notes, testing and imaging myself where available.  Lab Results  Component Value Date   WBC 6.5 04/24/2023   HGB 13.1 04/24/2023   HCT 38.9 (L) 04/24/2023   MCV 100.0 04/24/2023   PLT 201 04/24/2023      Component Value Date/Time   NA 140 04/24/2023 1602   NA 141 09/16/2018 1115   K 3.9 04/24/2023 1602   CL 104 04/24/2023 1602   CO2 29 04/24/2023 1602   GLUCOSE 172 (H) 04/24/2023 1602   BUN 21 04/24/2023 1602   BUN 25 09/16/2018 1115   CREATININE 1.08 04/24/2023 1602   CREATININE 1.19 (H) 10/21/2016 0953   CALCIUM 9.4 04/24/2023 1602   PROT 6.7 03/03/2023 1153   PROT 6.8 09/16/2018 1115   ALBUMIN 4.8 03/03/2023 1153   ALBUMIN 5.0 (H) 09/16/2018 1115   AST 16 03/03/2023 1153   ALT <5 03/03/2023 1153   ALKPHOS 58 03/03/2023 1153   BILITOT 1.2 03/03/2023 1153   BILITOT 1.0 09/16/2018 1115   GFRNONAA >60 04/24/2023 1602   GFRAA 68 09/16/2018 1115   Lab Results  Component Value Date   CHOL 118 11/04/2017   HDL 68.70 11/04/2017   LDLCALC 42 11/04/2017   TRIG 35.0 11/04/2017   CHOLHDL 2 11/04/2017   Lab Results  Component Value Date   HGBA1C 5.9 (H) 08/11/2015   Lab Results  Component Value Date   VITAMINB12 621 08/11/2015   Lab Results  Component Value Date   TSH 3.29 12/25/2017       10/20/2023    2:50 PM 04/10/2023   12:53 PM  MMSE - Mini Mental State Exam  Orientation to time 4 5  Orientation to Place 4 4  Registration 3 3  Attention/ Calculation 1 4  Recall 3 3  Language- name 2 objects 2 2  Language- repeat 1 1  Language- follow 3 step command 3 3  Language- read & follow direction 1 1  Write a sentence 1 1  Copy design 0 0  Total score 23 27         No data to display           ASSESSMENT AND PLAN  88 y.o. year old male  has a past medical history of Ankylosing spondylitis (HCC) (dx'd ~ 1974), Arthritis, BENIGN  PROSTATIC HYPERTROPHY, WITH OBSTRUCTION (01/15/2010), Bleeding duodenal ulcer, Bleeding esophageal ulcer, Bleeding stomach ulcer, GERD (02/22/2008), History of blood transfusion (08/26/1986), History of hiatal hernia, HYPERGLYCEMIA (11/18/2007), HYPERLIPIDEMIA (02/22/2008), HYPERTENSION, UNSPECIFIED (11/10/2009), Kidney stones, Osteoarthritis, Parkinson's disease (HCC), PEPTIC ULCER DISEASE (11/17/2007), Situational depression, and TIA (transient ischemic attack). here with    Primary parkinsonism (HCC)  History of recent fall  Recurrent falls  Hallucinations  Balance problem  Sialorrhea  Hypophonia  OSA on CPAP  Difficulty using continuous positive airway pressure (CPAP) device   [name] feels that PD symptoms continue to progress. He is tolerating and will continue carb/levo 1 tablet five times daily. We have discussed trial of Nuplazid and will discuss with Dr Frances Furbish. I will place orders for U Step Walker per PT/OT recommendations as he was able to be more active with less falls while using loaner walker. He will continue close follow up with PT/OT. Consider resuming exercise program once PT/OT is finished. Fall precautions advised. I have encouraged him to resume CPAP use asap. Healthy lifestyle habits encouraged. He will follow up with PCP as directed. He will return to Dr Frances Furbish in 3-4 months, sooner if needed. He verbalizes understanding and agreement with this plan.    No orders of the defined types were placed in this encounter.    No orders of the defined types were placed in this encounter.   I spent 45 minutes of face-to-face and non-face-to-face time with patient.  This included previsit chart review, lab review, study review, order entry, electronic health record documentation, patient education.   Shawnie Dapper, MSN, FNP-C 10/20/2023, 4:55 PM  [facility] [address] [place], [place] [address] [phone]

## 2023-10-15 NOTE — Patient Instructions (Addendum)
 Below is our plan:  We will continue carbidopa-levodopa 1 tablet five times daily. I will talk to Dr Frances Furbish about starting Nuplazid. I will see what we can do to order the U Step walker.   Please make sure you are staying well hydrated. I recommend 50-60 ounces daily. Well balanced diet and regular exercise encouraged. Consistent sleep schedule with 6-8 hours recommended.   Please continue follow up with care team as directed.   Follow up with Dr Frances Furbish in 3-4 months   You may receive a survey regarding today's visit. I encourage you to leave honest feed back as I do use this information to improve patient care. Thank you for seeing me today!

## 2023-10-20 ENCOUNTER — Ambulatory Visit (INDEPENDENT_AMBULATORY_CARE_PROVIDER_SITE_OTHER): Payer: Medicare Other | Admitting: Family Medicine

## 2023-10-20 ENCOUNTER — Encounter: Payer: Self-pay | Admitting: Family Medicine

## 2023-10-20 VITALS — BP 119/66 | HR 85 | Ht 67.0 in | Wt 156.0 lb

## 2023-10-20 DIAGNOSIS — R296 Repeated falls: Secondary | ICD-10-CM

## 2023-10-20 DIAGNOSIS — R498 Other voice and resonance disorders: Secondary | ICD-10-CM

## 2023-10-20 DIAGNOSIS — Z789 Other specified health status: Secondary | ICD-10-CM

## 2023-10-20 DIAGNOSIS — G20C Parkinsonism, unspecified: Secondary | ICD-10-CM

## 2023-10-20 DIAGNOSIS — R443 Hallucinations, unspecified: Secondary | ICD-10-CM

## 2023-10-20 DIAGNOSIS — G4733 Obstructive sleep apnea (adult) (pediatric): Secondary | ICD-10-CM | POA: Diagnosis not present

## 2023-10-20 DIAGNOSIS — K117 Disturbances of salivary secretion: Secondary | ICD-10-CM

## 2023-10-20 DIAGNOSIS — Z9181 History of falling: Secondary | ICD-10-CM | POA: Diagnosis not present

## 2023-10-20 DIAGNOSIS — R2689 Other abnormalities of gait and mobility: Secondary | ICD-10-CM | POA: Diagnosis not present

## 2023-10-21 ENCOUNTER — Ambulatory Visit: Payer: Medicare Other | Admitting: Occupational Therapy

## 2023-10-21 ENCOUNTER — Other Ambulatory Visit: Payer: Self-pay | Admitting: Family Medicine

## 2023-10-21 ENCOUNTER — Other Ambulatory Visit: Payer: Self-pay | Admitting: *Deleted

## 2023-10-21 ENCOUNTER — Encounter: Payer: Self-pay | Admitting: Family Medicine

## 2023-10-21 ENCOUNTER — Ambulatory Visit: Payer: Medicare Other

## 2023-10-21 DIAGNOSIS — R278 Other lack of coordination: Secondary | ICD-10-CM

## 2023-10-21 DIAGNOSIS — R293 Abnormal posture: Secondary | ICD-10-CM | POA: Diagnosis not present

## 2023-10-21 DIAGNOSIS — R296 Repeated falls: Secondary | ICD-10-CM

## 2023-10-21 DIAGNOSIS — M6281 Muscle weakness (generalized): Secondary | ICD-10-CM

## 2023-10-21 DIAGNOSIS — R29818 Other symptoms and signs involving the nervous system: Secondary | ICD-10-CM

## 2023-10-21 DIAGNOSIS — G20A2 Parkinson's disease without dyskinesia, with fluctuations: Secondary | ICD-10-CM | POA: Diagnosis not present

## 2023-10-21 DIAGNOSIS — R2681 Unsteadiness on feet: Secondary | ICD-10-CM | POA: Diagnosis not present

## 2023-10-21 DIAGNOSIS — R2689 Other abnormalities of gait and mobility: Secondary | ICD-10-CM

## 2023-10-21 DIAGNOSIS — Z9181 History of falling: Secondary | ICD-10-CM

## 2023-10-21 DIAGNOSIS — R262 Difficulty in walking, not elsewhere classified: Secondary | ICD-10-CM

## 2023-10-21 DIAGNOSIS — G20C Parkinsonism, unspecified: Secondary | ICD-10-CM

## 2023-10-21 MED ORDER — NUPLAZID 10 MG PO TABS: 10.0000 mg | ORAL_TABLET | Freq: Every day | ORAL | 3 refills | Status: DC

## 2023-10-21 NOTE — Therapy (Signed)
 OUTPATIENT OCCUPATIONAL THERAPY [facility]  Treatment Note & Discharge Patient Name: [name] MRN: [id number] DOB:07-15-1936, 88 y.o., male Today's Date: 10/21/2023  PCP: Casey Fillers, MD REFERRING PROVIDER: Huston Foley, MD   OCCUPATIONAL THERAPY DISCHARGE SUMMARY  Visits from Start of Care: 30  Current functional level related to goals / functional outcomes: Pt met 2 of 4 LTGs.  Pt is demonstrating improvements in mobility with focus on amplitude, cues for larger steps, and cues for "sharp turns" to decrease shuffling of gait with slow turns.  Pt has benefited from use of U-Step in the home and community and tends to move better with fewer falls when engaging in increased activity (walking in park or going to gym).   Remaining deficits: Frequent falls, shuffled gait, limitations in LB dressing due to postural issues.   Education / Equipment: Educated on large amplitude exercises, modifications of positioning and technique for LB dressing, and educated on adaptive strategies/equipment to increase ease with aspects of dressing  Patient agrees to discharge. Patient goals were not met. Patient is being discharged due to maximized rehab potential. .     END OF SESSION:  OT End of Session - 10/21/23 1408     Visit Number 30    Number of Visits 32    Date for OT Re-Evaluation 10/24/23    Authorization Type Medicare A &B    Progress Note Due on Visit 30    OT Start Time 1405    OT Stop Time 1445    OT Time Calculation (min) 40 min    Equipment Utilized During Treatment gait belt    Activity Tolerance Patient tolerated treatment well    Behavior During Therapy WFL for tasks assessed/performed;Flat affect                                        Past Medical History:  Diagnosis Date   Ankylosing spondylitis (HCC) dx'd ~ 1974   Arthritis    "back" (08/10/2015)   BENIGN PROSTATIC HYPERTROPHY, WITH OBSTRUCTION 01/15/2010   Bleeding  duodenal ulcer    Bleeding esophageal ulcer    Bleeding stomach ulcer    GERD 02/22/2008   History of blood transfusion 08/26/1986   "lost ~ 1/2 of my blood volume from multiple bleeding ulcers"   History of hiatal hernia    HYPERGLYCEMIA 11/18/2007   HYPERLIPIDEMIA 02/22/2008   HYPERTENSION, UNSPECIFIED 11/10/2009   Kidney stones    Osteoarthritis    c-spine   Parkinson's disease (HCC)    PEPTIC ULCER DISEASE 11/17/2007   Situational depression    "son died in MVA June 12, 2015"   TIA (transient ischemic attack)    "not that I know of in the past; they are trying to determine if I've had one today (08/10/2015)"   Past Surgical History:  Procedure Laterality Date   CATARACT EXTRACTION W/ INTRAOCULAR LENS  IMPLANT, BILATERAL Bilateral 2013   Patient Active Problem List   Diagnosis Date Noted   Ankylosing spondylitis of cervicothoracic region (HCC) 10/16/2016   TIA (transient ischemic attack) 08/10/2015   Carotid stenosis 08/04/2014   Hyperlipidemia 02/21/2012   BENIGN PROSTATIC HYPERTROPHY, WITH OBSTRUCTION 01/15/2010   Essential hypertension 11/10/2009   GERD 02/22/2008   NEPHROLITHIASIS, HX OF 11/17/2007    ONSET DATE: referral date 03/25/23  REFERRING DIAG: G20.A2 (ICD-10-CM) - Parkinson's disease without dyskinesia, with fluctuations R26.81 (ICD-10-CM) - Unsteadiness on feet R41.841 (ICD-10-CM) -  Cognitive communication deficit R27.8 (ICD-10-CM) - Other lack of coordination R26.2 (ICD-10-CM) - Difficulty in walking, not elsewhere classified  THERAPY DIAG:  Other symptoms and signs involving the nervous system  Muscle weakness (generalized)  Unsteadiness on feet  Abnormal posture  Other lack of coordination  Rationale for Evaluation and Treatment: Rehabilitation  SUBJECTIVE:   SUBJECTIVE STATEMENT: Pt (referring to injury on R wrist) reports "getting entangled" with BSC over toilet as he was backing up in the bathroom and he hit his hand on BSC as he fell.    Pt's  spouse asking about botox for drooling.  Pt accompanied by: self and spouse [name])  PERTINENT HISTORY: history of Parkinson's and ankylosing spondylitis   PRECAUTIONS: Fall  WEIGHT BEARING RESTRICTIONS: No  PAIN:  Are you having pain? Yes. 4/10 in back "as usual".  FALLS: Has patient fallen in last 6 months? Yes. Number of falls has had multiple falls over the last few months with multiple in early July, and then 2 falls this month.  07/07/23 - Pt's spouse reports that he has not had a fall since Weds. 07/09/23 - Pt reports fall between midnight and 3 AM last night when returning from using the bathroom.  09/24/23 - 2 falls about 2 weeks ago, fell backwards while attempting to stand. Went to [facility] ED though not admitted. Per 09/10/23 ED Note: Interpretation of Diagnostics CT imaging reveals 3 acute rib fractures, unclear if they are from the most recent fall or the prior fall earlier today.  CT head and cervical spine largely unremarkable.  LIVING ENVIRONMENT: Lives with: lives with their spouse Lives in: House/apartment Stairs: Yes: External: 2-3 steps; has a ramp at entrance with raised railings Internal: steps to basement and steps to loft - however pt does not need to go up/down those at this time. Has following equipment at home: Dan Humphreys - 2 wheeled, Environmental consultant - 4 wheeled, bed side commode, Shower chair, Ramped entry, and transport chair  PLOF: Requires assistive device for independence and Needs assistance with ADLs  PATIENT GOALS: to be able to move without concern/serious concern  OBJECTIVE:   HAND DOMINANCE: Right  ADLs: Overall ADLs: Pt reports there are times that he can be "fairly independent" with dressing but there are times where spouse has to provide significant assistance with. Transfers/ambulation related to ADLs: Utilizing RW and will occasionally walk bouts through the house without RW.  If pt loses balance backwards he is unable to correct. Eating: "fumble  fingered" UB Dressing: occasional assistance LB Dressing: socks and shoes are quite difficult Toileting: "I go to the bathroom by myself" but has difficulty getting up/down from toilet Bathing: washing at the sink, spouse will assist  Tub Shower transfers: pt feels uncomfortable in the shower with shower chair, but has not fallen Equipment: Shower seat with back and Walk in shower with 5.5" ledge with 30" door  IADLs: Light housekeeping: washes dishes when he is feeling strong, however frequent onset of weakness Meal Prep: Pt was primary cook prior to fall in 2023, but has returned to participation in aspects of meal prep Community mobility: recently renewed drivers license, but is not driving  MOBILITY STATUS: Needs Assist: CGA with mobility with RW and Hx of falls   POSTURE COMMENTS:  rounded shoulders and forward head, posterior pelvic tilt and R lean  FUNCTIONAL OUTCOME MEASURES: Fastening/unfastening 3 buttons: unable to button any buttons in 1 mins Physical performance test: PPT#2 (simulated eating) 22.79 sec & PPT#4 (donning/doffing jacket): 1:05  COORDINATION: Box and Blocks:  Right 30 blocks, Left 32 blocks  06/09/23: R: 39 blocks, L: 38 blocks  UE ROM:  all movements are slowed, decreased shoulder flexion d/t ankylosing spondylitis   UE MMT:    grossly 4/5 overall  COGNITION: Overall cognitive status:  Spouse reports thinking continues to be slower  OBSERVATIONS: Bradykinesia   TODAY'S TREATMENT:                                                                         DATE:   10/21/23 Self-care: encouraging pt and spouse to make walkways more open in home to allow for increased space with maneuvering.  Spouse reports that they have one bookshelf in particular that could move to allow increased space in one area.  Pt's spouse reports that son-in-law has mentioned that he could widen some doorways - however this has not yet happened.  Pt now has antique, heavy duty  bedside commode that they have inherited, however it is too low for him to stand up independently.  Discussed possible solutions to increase height of seat, however many of them may still have other issues that arise as pt with such severe posterior lean and decreased stepping pattern.  OT provided pt with handheld urinal bottle to decrease need to ambulate to/from toilet during the night as pt with frequent falls when accessing bathroom.   Routine exercise: educated on increased engagement in exercise at the gym as pt can get on/off Nustep for mobility as well as he has had history of working with an exercise instructor who tailors exercises to his condition.  Pt's spouse reports that a few weeks ago when the weather was nice, they were able to get out and walk in the park and pt with improved mobility and decreased falls.  OT encouraging increased mobility.  Spouse also reporting NP writing order for U-step walker  AE: OT educating pt and spouse on adaptive devices and strategies to increase independence and decrease burden of care on spouse with ADLs.  Reiterated Skechers step-ins shoes vs elastic laces, shirts with magnetic buttons, etc.  Spouse in agreement that she needs to look into these items and trial aspects of them with pt.      10/08/23 Functional transfers: Engaged in ambulatory transfers to/from various seats in the clinic with focus on large amplitude movements and turning in right angles instead of small shuffling turns to decrease instances of small steps and heels catching on each other.  OT then incorporating turns around cones and turning to sit on to various seats to simulate maneuvering in home as pt with greater challenges with small spaces and turns over straightaways.  OT providing cues for large steps and large turns and providing CGA with ambulation and turns.  When backing up, pt with tendency to have significantly smaller steps and veering to L when backing up - yielding in  increased possibility of missing target. Functional reach: engaged in dynamic standing and reach to facilitate increased upright posture and amplitude with overhead reach.  Utilizing resistance clothespins with pt reaching to mat table at side and then up vertical dowel with pt demonstrating improved upright posture with vertical reaching.  Completed bilaterally with improved reach with R>  L UE.    10/01/23 Large amplitude: engaged in modified LSVT big exercises to carry over to decreased burden of care with transitional movements and dressing. Pt still with good forward and downward reach when given targets to reach towards and verbal cues from therapist to further facilitate ROM. OT providing verbal cues and targets for upright posture when reaching up and out to side, utilizing upright poles for targets with open chest stretch. Pt demonstrating improved carryover and open stretch this session. NMR: engaged in simulated UB dressing/donning jacket initially with hula hoop to get "cape" technique with big arm movement up and around shoulders.  Utilized Ship broker for visual feedback to aid in amplitude when simulating donning jacket.  Pt still with quite rigid shoulder movements, benefiting from transition to scarf and with hand over hand to facilitate smooth movements. Education/safety: OT reiterating education on large amplitude movements, big steps, and sharp turns to aid in ambulation and turning.  Pt demonstrating improvements with large steps and turning when provided with verbal cues and targets.   PATIENT EDUCATION: Education details: see treatment note above Person educated: Patient and caregiver Education method: Explanation and Verbal cues Education comprehension: verbalized understanding and needs further education  HOME EXERCISE PROGRAM: 06/25/23 - Handouts: V/c for sit-to-stand (see 06/25/23 pt instructions), additional copy of 05/14/23 "Bag Exercises" (see 05/14/23 pt instructions)  LSVT  big exercises  Access Code: [email or web address] URL: [email or web address] Date: 06/23/2023 Prepared by: [facility] - Outpatient  Rehab - [facility]  Exercises - Seated Figure 4 Piriformis Stretch  - 1 x daily - 10 reps - 15 sec hold - Seated Piriformis Stretch  - 1 x daily - 10 reps - 15 sec hold - Seated Single Knee to Chest  - 1 x daily - 10 reps - 5 sec hold  07/09/23 - Provided examples of no-tie lace options for LB dressing. (Handout, see pt instructions).   GOALS: Goals reviewed with patient? Yes  SHORT TERM GOALS: Target date: 09/26/23  Pt and spouse will be independent in full body HEP with focus on large amplitude movements as needed to maintain/preserve function as pertaining to ADLs/IADLs. Baseline: Goal status: IN PROGRESS  2.  Pt will demonstrate ability to complete sit > stand as needed to get up from toilet or other seating options in home by completing x3 throughout session without cues or physical assistance to maintain/preserve function. Baseline: occasionally requiring cues and/or physical assistance Goal status:  IN PROGRESS  3. Pt and spouse will verbalize understanding of tips for fall prevention.  Baseline:  Goal status:  IN PROGRESS   LONG TERM GOALS: Target date: 10/24/23  Pt will report ability to complete UB/LB dressing at Supervision/setup level with use of AE and/or alternative strategies PRN.  Baseline: Min A Goal status: NOT MET- 10/21/23  2.  Pt and spouse will report understanding of adaptive techniques/strategies and/or modifications to routines to increase safety with mobility and self-care tasks in the home.  Baseline:  Goal status: MET - however still need to purchase some suggestions - 10/21/23  3.  Pt will demonstrate ability to maintain sequencing and timing with don/doff jacket to maintain in 1-1:10 minute range with use of adaptive strategies PRN to preserve function with UB dressing and don/doff jacket.  Baseline: 1:05 Goal  status: NOT MET - 10/21/23  4.  Pt and spouse will be independent in updated PD specific HEP to maintain/preserve function as needed for ADLs and quality of life. Baseline: Goal status: MET - 10/21/23  ASSESSMENT:  CLINICAL IMPRESSION: Pt continues to demonstrate much improved ease with ambulation and transfers when provided with initial cue and targets for turning, utilizing "right angle turns" over small curved turns.  Pt still with frequent falls in the home as well as heavy posterior lean when sitting down.  Pt has received some DME during this bout of therapy, however due to heavy posterior lean when sitting recommend BSC over toilet and not in room at this time.  Pt and spouse still attempting to problem solve safe toileting options for the night time due to frequent urination overnight.  Pt unsure about use of urinal, however therapist provided them with one as an option.  Pt and spouse have been provided with options of adaptive equipment to aid in ease of dressing, however have not yet purchased any but report understanding of potential increased ease.  Pt will benefit from a break from therapy to allow time to focus on making modifications in the home, returning to routine exercise at the park vs in the gym, and obtain some recommended AE to increase each with ADLs.  Pt's spouse is understanding of recommendations and able to provide care at current level.  Will D/C at this time and anticipate follow-up in three months time for resumed care as long as functional decline has not occurred.    PERFORMANCE DEFICITS: in functional skills including ADLs, IADLs, coordination, dexterity, ROM, strength, pain, flexibility, Fine motor control, Gross motor control, balance, body mechanics, endurance, decreased knowledge of precautions, decreased knowledge of use of DME, and UE functional use and psychosocial skills including coping strategies, environmental adaptation, and routines and behaviors.    IMPAIRMENTS: are limiting patient from ADLs and IADLs.    PLAN:  OT FREQUENCY: 1x/week  OT DURATION: 8 weeks  PLANNED INTERVENTIONS: self care/ADL training, therapeutic exercise, therapeutic activity, neuromuscular re-education, balance training, functional mobility training, ultrasound, compression bandaging, moist heat, cryotherapy, patient/family education, cognitive remediation/compensation, psychosocial skills training, energy conservation, coping strategies training, and DME and/or AE instructions  RECOMMENDED OTHER SERVICES: NA  CONSULTED AND AGREED WITH PLAN OF CARE: Patient    Rosalio Loud, OTR/L 10/21/2023, 2:10 PM   [facility] at [facility] [address] [place], [place] [address] Phone # [phone] Fax # [phone]

## 2023-10-21 NOTE — Therapy (Signed)
 OUTPATIENT PHYSICAL THERAPY NEURO TREATMENT NOTE and D/C Summary Patient Name: [name] MRN: [id number] DOB:[birth year], 88 y.o., male Today's Date: 10/21/2023   PCP: Garlan Fillers REFERRING PROVIDER: Huston Foley, MD PHYSICAL THERAPY DISCHARGE SUMMARY  Visits from Start of Care: 32  Current functional level related to goals / functional outcomes: See below for outcome measures. Functional mobility: min A to SBA for sit to stand and stand-pivot transfers.  Ambulation level surfaces w/ CGA. Bed mobility CGA-min A   Remaining deficits: Balance, motor control, safety awareness, gait deviations   Education / Equipment: HEP and recommendation of U-STEP for ambulation/transfers   Patient agrees to discharge. Patient goals were partially met. Patient is being discharged due to maximized rehab potential.  Pt with chronic, progressive neurologic disease.  Has reached level for safer performance to replicate activities with caregiver assistance.      END OF SESSION:  PT End of Session - 10/21/23 1451     Visit Number 32    Number of Visits 34    Date for PT Re-Evaluation 10/21/23    Authorization Type Medicare/AARP    Progress Note Due on Visit 36    PT Start Time 1445    PT Stop Time 1530    PT Time Calculation (min) 45 min    Equipment Utilized During Treatment Gait belt    Activity Tolerance Patient tolerated treatment well    Behavior During Therapy WFL for tasks assessed/performed                            Past Medical History:  Diagnosis Date   Ankylosing spondylitis (HCC) dx'd ~ 1974   Arthritis    "back" (08/10/2015)   BENIGN PROSTATIC HYPERTROPHY, WITH OBSTRUCTION 01/15/2010   Bleeding duodenal ulcer    Bleeding esophageal ulcer    Bleeding stomach ulcer    GERD 02/22/2008   History of blood transfusion 08/26/1986   "lost ~ 1/2 of my blood volume from multiple bleeding ulcers"   History of hiatal hernia    HYPERGLYCEMIA 11/18/2007    HYPERLIPIDEMIA 02/22/2008   HYPERTENSION, UNSPECIFIED 11/10/2009   Kidney stones    Osteoarthritis    c-spine   Parkinson's disease (HCC)    PEPTIC ULCER DISEASE 11/17/2007   Situational depression    "son died in MVA 06/16/2015"   TIA (transient ischemic attack)    "not that I know of in the past; they are trying to determine if I've had one today (08/10/2015)"   Past Surgical History:  Procedure Laterality Date   CATARACT EXTRACTION W/ INTRAOCULAR LENS  IMPLANT, BILATERAL Bilateral 2013   Patient Active Problem List   Diagnosis Date Noted   Ankylosing spondylitis of cervicothoracic region (HCC) 10/16/2016   TIA (transient ischemic attack) 08/10/2015   Carotid stenosis 08/04/2014   Hyperlipidemia 02/21/2012   BENIGN PROSTATIC HYPERTROPHY, WITH OBSTRUCTION 01/15/2010   Essential hypertension 11/10/2009   GERD 02/22/2008   NEPHROLITHIASIS, HX OF 11/17/2007    ONSET DATE: "several years"  REFERRING DIAG: G20.A2 (ICD-10-CM) - Parkinson's disease with fluctuating manifestations, unspecified whether dyskinesia present R26.81 (ICD-10-CM) - Unsteadiness on feet R41.841 (ICD-10-CM) - Cognitive communication deficit R27.8 (ICD-10-CM) - Other lack of coordination R26.2 (ICD-10-CM) - Difficulty in walking, not elsewhere classified  THERAPY DIAG:  Other symptoms and signs involving the nervous system  Muscle weakness (generalized)  Unsteadiness on feet  Abnormal posture  Other abnormalities of gait and mobility  Parkinson's disease with fluctuating manifestations,  unspecified whether dyskinesia present (HCC)  Difficulty in walking, not elsewhere classified  Rationale for Evaluation and Treatment: Rehabilitation  SUBJECTIVE:                                                                                                                                                                                             SUBJECTIVE STATEMENT: Difficulty with walking around home, AD doesn't  fit through all doorways Pt accompanied by: significant other  PERTINENT HISTORY: PD, complex medical history of peptic ulcer disease with history of upper GI bleed, TIA, depression, degenerative disc disease in the neck, osteoarthritis, kidney stones, hyperlipidemia, hypertension, history of hiatal hernia, BPH, arthritis, and ankylosing spondylitis, and parkinsonism   PAIN:  Are you having pain? "Various places"  mid back mostly  PRECAUTIONS: Fall  RED FLAGS: None   WEIGHT BEARING RESTRICTIONS: No  FALLS: Has patient fallen in last 6 months? Yes. Number of falls unknown, multiple  LIVING ENVIRONMENT: Lives with: lives with their spouse Lives in: House/apartment Stairs: Ramp to enter, stairs to loft and basement, ground floor set-up Has following equipment at home: Dan Humphreys - 2 wheeled  PLOF: Needs assistance with ADLs, Needs assistance with homemaking, and Needs assistance with gait, supervision for ambulation at household distances  PATIENT GOALS: improve balance, walking, reduce falls  OBJECTIVE:   TODAY'S TREATMENT: 10/21/23 Activity Comments  POC details: performance/review   Transfer training   TUG test 50.81 sec w/ U-step  Gait speed Self-selected: 0.8 ft/sec CGA w/ cues for fast pace: 1.36 ft/sec Training in sequencing turns w/ RW and U-step            PATIENT EDUCATION: Education details: training in use of U-step to improve safety with transfers and gait Person educated: Patient and Spouse Education method: Explanation, Demonstration, and Verbal cues Education comprehension: verbalized understanding and needs further education   Access Code: [email or web address] URL: [email or web address] Date: 05/08/2023 Prepared by: [facility] - Outpatient  Rehab - [facility]  Program Notes perform with wife's supervision  Exercises - Mini Squat with Counter Support  - 1 x daily - 5 x weekly - 2 sets - 10 reps - Staggered Stance Forward Backward Weight Shift  with Counter Support  - 1 x daily - 5 x weekly - 2 sets - 10 reps - Standing Quarter Turn with Counter Support  - 1 x daily - 5 x weekly - 2 sets - 5 reps - Seated Long Arc Quad with Ankle Weight  - 1 x daily - 5 x weekly - 3 sets - 10 reps - Seated Hip Flexion March with Ankle Weights  - 1 x  daily - 5 x weekly - 3 sets - 10 reps - Seated Active Hip Flexion  - 1 x daily - 7 x weekly - 1 sets - 10 reps - 5-10 sec hold        ------------------------------------------------- Objective measures below taken at initial evaluation:  DIAGNOSTIC FINDINGS: n/a for episode  COGNITION: Overall cognitive status: History of cognitive impairments - at baseline   SENSATION: Neuropathy in bilat feet  COORDINATION: Difficulty with rapid alternating movements    MUSCLE TONE: NT   POSTURE: rounded shoulders, forward head, flexed trunk , and head down , kyphosis  LOWER EXTREMITY ROM:     Active  Right Eval Left Eval  Hip flexion    Hip extension    Hip abduction    Hip adduction    Hip internal rotation    Hip external rotation    Knee flexion 120 120  Knee extension 0 0  Ankle dorsiflexion 5 12  Ankle plantarflexion    Ankle inversion    Ankle eversion     (Blank rows = not tested)  LOWER EXTREMITY MMT:    BLE strength grossly 3+/5  BED MOBILITY:  Mod A  TRANSFERS: Assistive device utilized: Environmental consultant - 2 wheeled  Sit to stand: CGA and Min A Stand to sit: SBA Chair to chair: SBA and CGA Floor: Mod A and Max A  RAMP:  Level of Assistance: CGA Assistive device utilized: Environmental consultant - 2 wheeled Ramp Comments:   CURB:  Level of Assistance: CGA Assistive device utilized: Environmental consultant - 2 wheeled Curb Comments:   STAIRS: NT  GAIT: Gait pattern: step to pattern, decreased stride length, and poor foot clearance- Right Distance walked: 100 Assistive device utilized: Walker - 2 wheeled Level of assistance: SBA and CGA Comments: level surfaces  FUNCTIONAL TESTS:  5 times  sit to stand: 45 sec w/ CGA Timed up and go (TUG): 1 min 27 sec  M-CTSIB  Condition 1: Firm Surface, EO 10 Sec, Severe and retro-LOB  Sway  Condition 2: Firm Surface, EC 3 Sec, Severe and retro-LOB  Sway  Condition 3: Foam Surface, EO  Sec,  Sway  Condition 4: Foam Surface, EC  Sec,  Sway       GOALS: Goals reviewed with patient? Yes  SHORT TERM GOALS: UPDATED Target date: 05/15/2023>09/23/2023      Patient will be independent in HEP to improve functional outcomes Baseline: has been reviewed with PT and wife previously 06/04/23 Goal status: MET 06/04/23  2.  Teach-back strategies to reduce freezing of gait during turns Baseline: 16 steps to complete 180 deg turn; unable to recall 06/04/23 Goal status: NOT MET 06/04/23  3.  Demo improved safety and independence with sit to stand completing transfers with set-up assist Baseline: CGA-min A; unchanged 06/04/23 Goal status: NOT MET 06/04/23  4.  Demo ability to perform 10 backwards steps without LOB/retropulsion to improve motor control and facilitate righting reactions  Baseline: 3-5 steps before retropulsion/LOB; 10-15 steps with U-step  Goal status: MET    LONG TERM GOALS: UPDATED Target date: 06/05/2023>06/20/2023> 07/17/2023> 10/21/2023        Demo reduced risk for falls per time 35 sec TUG test Baseline: 1 min 27 sec; 1 min 15 sec 06/04/23; 54 sec 06/19/23; 38 sec (07/16/23); (08/26/23) 2 min; (10/20/22) 50 sec w/ U-step Goal status: NOT MET  2.  Demo improved BLE strength and balance per time of 25 sec 5xSTS test to increase independence with mobility Baseline: 45 sec; 1  min 33 sec with occasional min A and consistent verbal cues 06/04/23; 1 min 18 sec 06/19/23; (07/16/23) 41 sec w/ arm rest push-off; (08/26/23) 52 sec; (10/21/23) 47 sec w/ BUE use/retro-LOB Goal status: NOT MET  3.  Demo improved safety with ambulation on level surfaces w/ supervision x 150 ft Baseline: SBA-CGA; occasional min A for balance and cueing to  reduce freezing for 160 ft 06/04/23; 150 ft w/ supervision level surfaces x 2 min 45 sec; (08/26/23) CGA-SBA w/ RW; (10/21/23) CGA-SBA level surfaces w/ U-step  Goal status: NOT MET  4.  Improve gait speed to 1.5 ft/sec to improve efficiency of ambulation  Baseline: (10/21/23) 0.8 ft/sec  Goal status: NOT MET  ASSESSMENT:  CLINICAL IMPRESSION: Performance and review of STG/LTG outcome measurements with improved performance TUG test with best performance using U-STEP which greatly improves stability and reduces LOB during freezing of gait epsiodes when negotiating turns.  Functional mobility requiring min A to SBA for sit to stand transfers due to retro-pulsion and LOB with cues for position and sequence.  Ambulation on level surfaces with CGA-SBA with best performance using U-STEP walker with eliminates his tendency for lateral LOB with typical RW.  Gait training with U-STEP and forced pace to increase stride length and velocity to great effect increasing from self-selected speed of 0.8 ft/sec to 1.36 ft/sec with CGA and cues for pacing.  Improved sequence in turns with U-STEP vs RW reducing lateral LOB.  Pt and cargiver have been provided with materials for obtaining personal U-STEP walker as this greatly improves his safety and independence with functional mobility.  Caregiver training completed and proficient in carryover for home and fitness-facility activities.  Will D/C at this time and anticipate follow-up in three months time for resumed care as long as functional decline has not occurred.   OBJECTIVE IMPAIRMENTS: Abnormal gait, decreased activity tolerance, decreased balance, decreased coordination, decreased mobility, difficulty walking, decreased ROM, decreased strength, improper body mechanics, and postural dysfunction.   ACTIVITY LIMITATIONS: carrying, lifting, standing, stairs, transfers, bed mobility, and locomotion level  PARTICIPATION LIMITATIONS: meal prep, cleaning, laundry,  interpersonal relationship, community activity, and exercise routine  PERSONAL FACTORS: Age, Time since onset of injury/illness/exacerbation, and 3+ comorbidities: PMH  are also affecting patient's functional outcome.   REHAB POTENTIAL: Good  CLINICAL DECISION MAKING: Evolving/moderate complexity  EVALUATION COMPLEXITY: Moderate  PLAN:  PT FREQUENCY: 1x/week  PT DURATION: 8 weeks  PLANNED INTERVENTIONS: Therapeutic exercises, Therapeutic activity, Neuromuscular re-education, Balance training, Gait training, Patient/Family education, Self Care, and Joint mobilization  PLAN FOR NEXT SESSION: D/C to home/fitness facility program at this time.  Recommend U-STEP for greatest safety in transfers and gait  2:52 PM, 10/21/23 M. Shary Decamp, PT, DPT Physical Therapist- [place] Office Number: [phone]

## 2023-10-22 ENCOUNTER — Other Ambulatory Visit: Payer: Self-pay | Admitting: Family Medicine

## 2023-10-22 ENCOUNTER — Encounter: Payer: Self-pay | Admitting: Family Medicine

## 2023-10-22 ENCOUNTER — Other Ambulatory Visit: Payer: Self-pay | Admitting: *Deleted

## 2023-10-22 DIAGNOSIS — G20C Parkinsonism, unspecified: Secondary | ICD-10-CM

## 2023-10-22 DIAGNOSIS — R443 Hallucinations, unspecified: Secondary | ICD-10-CM

## 2023-10-22 DIAGNOSIS — M459 Ankylosing spondylitis of unspecified sites in spine: Secondary | ICD-10-CM | POA: Diagnosis not present

## 2023-10-22 MED ORDER — NUPLAZID 10 MG PO TABS: 10.0000 mg | ORAL_TABLET | Freq: Every day | ORAL | 3 refills | Status: DC

## 2023-10-22 NOTE — Telephone Encounter (Signed)
 Faxed order for U Step Walker to [facility] at [phone]. Received fax confirmation.

## 2023-10-23 NOTE — Telephone Encounter (Signed)
 Tried calling back x2 at [phone]. Got busy signal. Called [phone]. Spoke w/ wife. Relayed Amy's recommendation. Wife concerned about SE sedation that Amy previously mentioned and what to expect on med. I placed on hold and spoke w/ Amy. Per Amy, sedation not a common SE. More common SE include GI upset (constipation), leg swelling and worsening hallucinations. She does not write med often. Can hold off starting if they wish and discuss further with Dr. Frances Furbish. Other option is botox for excessive drooling. Aware starting him on low dose to make sure he would tolerate ok. She is concerned about SE of constipation as he already has constipation issues.   Wife would like form faxed in to see what cost would be first. That way they have this info prior to speaking with Dr. Frances Furbish. I emailed to her at [email or web address]. Waiting on signed return auth form.  She also reports [employer] called her and does not know what Ustep walker is, unable to provide. I looked this up and found rx form on [email or web address] to complete and send in. Amy signed. I faxed to them at [phone]. Received fax confirmation. See below.

## 2023-10-23 NOTE — Telephone Encounter (Signed)
 Received fax from [facility] that they cannot fill Nuplazid. Pt must sign service request form and that has to be sent to [facility] to get pt on drug. I called wife/[name] Swaine at [phone]. Patient picked up. He asked we call after 2pm today. Wife not home until then.

## 2023-10-24 NOTE — Progress Notes (Signed)
 CARDIOLOGY OFFICE NOTE  Date:  11/03/2023    [name] Date of Birth: 1936/01/03 Medical Record #[id number]  PCP:  Garlan Fillers, MD  Cardiologist:  Katherene Dinino [place] MD   Chief Complaint  Patient presents with   Hypertension   Hyperlipidemia    History of Present Illness: [name] is a 88 y.o. male who presents today for cardiac follow up.   He has a history of hyperlipidemia, HTN, and GERD. He has no known CAD. Negative stress echo in 2011. Does have known carotid disease -  study in 07/2014 with 40 to 59% RICA stenosis. Admitted in December 2016 with possible TIA. Echo was unremarkable. Carotid dopplers then showed only mild disease.   He has  Parkinson's disease and ankylosing spondylitis.  On follow up today he is doing OK. No chest pain or dyspnea. He does note sometimes his legs feel heavy/swollen. Still goes to the gym.    Past Medical History:  Diagnosis Date   Ankylosing spondylitis (HCC) dx'd ~ 1974   Arthritis    "back" (08/10/2015)   BENIGN PROSTATIC HYPERTROPHY, WITH OBSTRUCTION 01/15/2010   Bleeding duodenal ulcer    Bleeding esophageal ulcer    Bleeding stomach ulcer    GERD 02/22/2008   History of blood transfusion 08/26/1986   "lost ~ 1/2 of my blood volume from multiple bleeding ulcers"   History of hiatal hernia    HYPERGLYCEMIA 11/18/2007   HYPERLIPIDEMIA 02/22/2008   HYPERTENSION, UNSPECIFIED 11/10/2009   Kidney stones    Osteoarthritis    c-spine   Parkinson's disease (HCC)    PEPTIC ULCER DISEASE 11/17/2007   Situational depression    "son died in MVA 07/07/2015"   TIA (transient ischemic attack)    "not that I know of in the past; they are trying to determine if I've had one today (08/10/2015)"    Past Surgical History:  Procedure Laterality Date   CATARACT EXTRACTION W/ INTRAOCULAR LENS  IMPLANT, BILATERAL Bilateral 2013     Medications: Current Outpatient Medications  Medication Sig Dispense Refill   aspirin 81 MG  tablet Take 81 mg by mouth daily.       atorvastatin (LIPITOR) 10 MG tablet TAKE 1 TABLET BY MOUTH DAILY (Patient taking differently: Take 10 mg by mouth at bedtime.) 90 tablet 3   carbidopa-levodopa (SINEMET IR) 25-100 MG tablet TAKE 1 TABLET BY MOUTH 5 TIMES  DAILY (AT 6 AM, 10 AM, 2 PM, 6  PM, 9 PM DAILY ) 450 tablet 0   cholecalciferol (VITAMIN D3) 25 MCG (1000 UNIT) tablet Take 1,000 Units by mouth daily.     levothyroxine (SYNTHROID) 50 MCG tablet Take 50 mcg by mouth daily before breakfast.     losartan-hydrochlorothiazide (HYZAAR) 50-12.5 MG tablet TAKE ONE-HALF TABLET BY MOUTH  DAILY 15 tablet 11   Multiple Vitamin (MULTIVITAMIN WITH MINERALS) TABS tablet Take 1 tablet by mouth daily with breakfast.     pantoprazole (PROTONIX) 20 MG tablet Take 20 mg by mouth at bedtime.     PRESCRIPTION MEDICATION BiPAP- At bedtime     Secukinumab (COSENTYX [place]) Inject into the vein every 30 (thirty) days.     sulindac (CLINORIL) 150 MG tablet Take 150 mg by mouth at bedtime.     SYSTANE COMPLETE PF 0.6 % SOLN Place 1 drop into both eyes 4 (four) times daily.     Pimavanserin Tartrate (NUPLAZID) 10 MG TABS Take 10 mg by mouth daily. (Patient not taking: Reported on  11/03/2023) 90 tablet 3   No current facility-administered medications for this visit.    Allergies: No Known Allergies  Social History: The patient  reports that he quit smoking about 45 years ago. His smoking use included cigars and pipe. He has never used smokeless tobacco. He reports that he does not drink alcohol and does not use drugs.   Family History: The patient's family history includes Appendicitis in his mother; Colon cancer in his father and another family member; Diabetes type II in his father; Lupus in his sister.   Review of Systems: Please see the history of present illness.   All other systems are reviewed and negative.   Physical Exam: VS:  BP 135/73   Pulse 89   Ht 5\' 7"  (1.702 m)   Wt 159 lb (72.1 kg)   SpO2  98%   BMI 24.90 kg/m  .  BMI Body mass index is 24.9 kg/m.  Wt Readings from Last 3 Encounters:  11/03/23 159 lb (72.1 kg)  10/20/23 156 lb (70.8 kg)  05/02/23 153 lb (69.4 kg)   GENERAL:  Well appearing WM in NAD HEENT:  PERRL, EOMI, sclera are clear. Oropharynx is clear. NECK:  No jugular venous distention, carotid upstroke brisk and symmetric, no bruits, no thyromegaly or adenopathy LUNGS:  Clear to auscultation bilaterally CHEST:  Kyphotic.  HEART:  RRR,  PMI not displaced or sustained,S1 and S2 within normal limits, no S3, no S4: no clicks, no rubs, no murmurs ABD:  Soft, nontender. BS +, no masses or bruits. No hepatomegaly, no splenomegaly EXT:  2 + pulses throughout, no edema, no cyanosis no clubbing SKIN:  Warm and dry.  No rashes NEURO:  Alert and oriented x 3. Cranial nerves II through XII intact. PSYCH:  Cognitively intact   LABORATORY DATA:   Lab Results  Component Value Date   WBC 6.5 04/24/2023   HGB 13.1 04/24/2023   HCT 38.9 (L) 04/24/2023   PLT 201 04/24/2023   GLUCOSE 172 (H) 04/24/2023   CHOL 118 11/04/2017   TRIG 35.0 11/04/2017   HDL 68.70 11/04/2017   LDLCALC 42 11/04/2017   ALT <5 03/03/2023   AST 16 03/03/2023   NA 140 04/24/2023   K 3.9 04/24/2023   CL 104 04/24/2023   CREATININE 1.08 04/24/2023   BUN 21 04/24/2023   CO2 29 04/24/2023   TSH 3.29 12/25/2017   PSA 0.38 01/15/2010   INR 0.9 11/03/2007   HGBA1C 5.9 (H) 08/11/2015   Dated 05/29/17: creatinine 1.17. Hgb 12.4.  Dated 10/13/19: cholesterol 141, triglycerides 76, HDL 59, LDL 67. Creatinine 1.2. otherwise CMET, CBC, TFTs normal. Dated 10/19/20: cholesterol 133, triglycerides 71, HDL 60, LDL 59. Chemistries and TSH normal Dated 11/13/21: cholesterol 130, triglycerides 55, HDL 63, LDL 56. LFTs and TSH normal.  Dated 01/13/23: cholesterol 127, triglycerides 64, HDL 52, LDL 62.   BNP (last 3 results) No results for input(s): "BNP" in the last 8760 hours.  ProBNP (last 3 results) No  results for input(s): "PROBNP" in the last 8760 hours.   Other Studies Reviewed Today:  Echo 7/10/4: IMPRESSIONS     1. Left ventricular ejection fraction, by estimation, is 55 to 60%. Left  ventricular ejection fraction by 3D volume is 58 %. The left ventricle has  normal function. The left ventricle has no regional wall motion  abnormalities. Left ventricular diastolic   parameters are indeterminate.   2. Right ventricular systolic function is normal. The right ventricular  size is normal.  3. Right atrial size was moderately dilated.   4. The mitral valve is normal in structure. No evidence of mitral valve  regurgitation. No evidence of mitral stenosis.   5. The aortic valve is normal in structure. Aortic valve regurgitation is  trivial. No aortic stenosis is present.   6. The inferior vena cava is normal in size with greater than 50%  respiratory variability, suggesting right atrial pressure of 3 mmHg.   Assessment/Plan: 1. Carotid stenosis: mild nonobstructive by doppler in July 2024  2. Hyperlipidemia: continue statin. Good control.  3. HTN: well controlled.  4.. Ankylosing spondylitis  5. Parkinson's disease. Followed by Neuro.  Follow up in one year  Signed: Talishia Betzler [place] MD, [facility]  11/03/2023 2:29 PM  [facility]

## 2023-10-30 ENCOUNTER — Telehealth: Payer: Self-pay

## 2023-10-30 NOTE — Telephone Encounter (Signed)
Cpap order faxed 

## 2023-10-30 NOTE — Telephone Encounter (Addendum)
 Called [employer] at [phone].  Confirmed they received order and order shipped to pt 10/28/2023.    Re-emailed Nuplazid consent form to wife at [email or web address] since we have not received back. Waiting on signed return auth form.

## 2023-10-31 ENCOUNTER — Encounter (HOSPITAL_BASED_OUTPATIENT_CLINIC_OR_DEPARTMENT_OTHER): Payer: Self-pay

## 2023-11-03 ENCOUNTER — Ambulatory Visit: Payer: Medicare Other | Attending: Cardiology | Admitting: Cardiology

## 2023-11-03 ENCOUNTER — Encounter: Payer: Self-pay | Admitting: Cardiology

## 2023-11-03 VITALS — BP 135/73 | HR 89 | Ht 67.0 in | Wt 159.0 lb

## 2023-11-03 DIAGNOSIS — E782 Mixed hyperlipidemia: Secondary | ICD-10-CM | POA: Diagnosis not present

## 2023-11-03 DIAGNOSIS — I10 Essential (primary) hypertension: Secondary | ICD-10-CM

## 2023-11-03 MED ORDER — LOSARTAN POTASSIUM-HCTZ 50-12.5 MG PO TABS: 0.5000 | ORAL_TABLET | Freq: Every day | ORAL | 3 refills | Status: DC

## 2023-11-03 NOTE — Patient Instructions (Signed)
 Medication Instructions:  Continue same medications   Lab Work: None ordered   Testing/Procedures: None ordered   Follow-Up: At [facility], you and your health needs are our priority.  As part of our continuing mission to provide you with exceptional heart care, we have created designated Provider Care Teams.  These Care Teams include your primary Cardiologist (physician) and Advanced Practice Providers (APPs -  Physician Assistants and Nurse Practitioners) who all work together to provide you with the care you need, when you need it.  We recommend signing up for the patient portal called "MyChart".  Sign up information is provided on this After Visit Summary.  MyChart is used to connect with patients for Virtual Visits (Telemedicine).  Patients are able to view lab/test results, encounter notes, upcoming appointments, etc.  Non-urgent messages can be sent to your provider as well.   To learn more about what you can do with MyChart, go to [email or web address].    Your next appointment:  1 year    Call  in Nov to schedule March appointment     Provider: Dr.Jordan

## 2023-11-04 ENCOUNTER — Emergency Department (HOSPITAL_BASED_OUTPATIENT_CLINIC_OR_DEPARTMENT_OTHER)

## 2023-11-04 ENCOUNTER — Emergency Department (HOSPITAL_BASED_OUTPATIENT_CLINIC_OR_DEPARTMENT_OTHER)
Admission: EM | Admit: 2023-11-04 | Discharge: 2023-11-04 | Disposition: A | Discharge status: Home / Self Care | Attending: Emergency Medicine | Admitting: Emergency Medicine

## 2023-11-04 ENCOUNTER — Encounter (HOSPITAL_BASED_OUTPATIENT_CLINIC_OR_DEPARTMENT_OTHER): Payer: Self-pay | Admitting: Emergency Medicine

## 2023-11-04 DIAGNOSIS — I10 Essential (primary) hypertension: Secondary | ICD-10-CM | POA: Diagnosis not present

## 2023-11-04 DIAGNOSIS — S51812A Laceration without foreign body of left forearm, initial encounter: Secondary | ICD-10-CM | POA: Insufficient documentation

## 2023-11-04 DIAGNOSIS — S59912A Unspecified injury of left forearm, initial encounter: Secondary | ICD-10-CM | POA: Diagnosis not present

## 2023-11-04 DIAGNOSIS — M79632 Pain in left forearm: Secondary | ICD-10-CM | POA: Diagnosis not present

## 2023-11-04 DIAGNOSIS — M19032 Primary osteoarthritis, left wrist: Secondary | ICD-10-CM | POA: Diagnosis not present

## 2023-11-04 DIAGNOSIS — Z7982 Long term (current) use of aspirin: Secondary | ICD-10-CM | POA: Diagnosis not present

## 2023-11-04 DIAGNOSIS — M85832 Other specified disorders of bone density and structure, left forearm: Secondary | ICD-10-CM | POA: Diagnosis not present

## 2023-11-04 DIAGNOSIS — W19XXXA Unspecified fall, initial encounter: Secondary | ICD-10-CM | POA: Insufficient documentation

## 2023-11-04 DIAGNOSIS — Z23 Encounter for immunization: Secondary | ICD-10-CM | POA: Diagnosis not present

## 2023-11-04 DIAGNOSIS — G20C Parkinsonism, unspecified: Secondary | ICD-10-CM | POA: Insufficient documentation

## 2023-11-04 DIAGNOSIS — S59902A Unspecified injury of left elbow, initial encounter: Secondary | ICD-10-CM | POA: Diagnosis present

## 2023-11-04 MED ORDER — LIDOCAINE-EPINEPHRINE (PF) 2 %-1:200000 IJ SOLN
10.0000 mL | Freq: Once | INTRAMUSCULAR | Status: AC
  Administered 2023-11-04: 10 mL
  Filled 2023-11-04: qty 20

## 2023-11-04 MED ORDER — TETANUS-DIPHTH-ACELL PERTUSSIS 5-2.5-18.5 LF-MCG/0.5 IM SUSY
0.5000 mL | PREFILLED_SYRINGE | Freq: Once | INTRAMUSCULAR | Status: AC
  Administered 2023-11-04: 0.5 mL via INTRAMUSCULAR
  Filled 2023-11-04: qty 0.5

## 2023-11-04 NOTE — ED Provider Notes (Signed)
 [place] EMERGENCY DEPARTMENT AT [facility] Provider Note   CSN: [id number] Arrival date & time: 11/04/23  1424     History  Chief Complaint  Patient presents with   [name] is a 88 y.o. male.  Patient is an 88 year old male with a history of Parkinson's disease, duodenal and esophageal ulcers, ankylosing spondylitis, hypertension and hyperlipidemia who is presenting today with an injury to the left elbow area after a fall around 3 AM today.  Patient reports that he had got up and he saw dryer sheet on the floor that he was going to pick up but then reports his Parkinson's kicked in and he started going backwards and could not stop himself.  He fell up against a bookcase and then fell down to the ground.  He does think he hit his head on the bookcase a little bit but denies any loss of consciousness or any headache or neck pain.  He initially went to an urgent care but they did not feel that they could close the wound because they felt that it was too deep.  Patient is able to move his arm and denies any numbness or tingling in his hand.  He denies a headache or any nausea or vomiting.  He mobility is at baseline.  Thinks his last tetanus shot was more than 5 years ago.  The history is provided by the patient.  Fall       Home Medications Prior to Admission medications   Medication Sig Start Date End Date Taking? Authorizing Provider  aspirin 81 MG tablet Take 81 mg by mouth daily.      [provider]  atorvastatin (LIPITOR) 10 MG tablet TAKE 1 TABLET BY MOUTH DAILY Patient taking differently: Take 10 mg by mouth at bedtime. 03/04/23   [place], Peter M, MD  carbidopa-levodopa (SINEMET IR) 25-100 MG tablet TAKE 1 TABLET BY MOUTH 5 TIMES  DAILY (AT 6 AM, 10 AM, 2 PM, 6  PM, 9 PM DAILY ) 09/30/23   Huston Foley, MD  cholecalciferol (VITAMIN D3) 25 MCG (1000 UNIT) tablet Take 1,000 Units by mouth daily. 04/13/19   [provider]  levothyroxine  (SYNTHROID) 50 MCG tablet Take 50 mcg by mouth daily before breakfast. 01/23/21   [provider]  losartan-hydrochlorothiazide (HYZAAR) 50-12.5 MG tablet Take 0.5 tablets by mouth daily. 11/03/23   [place], Peter M, MD  Multiple Vitamin (MULTIVITAMIN WITH MINERALS) TABS tablet Take 1 tablet by mouth daily with breakfast.    [provider]  pantoprazole (PROTONIX) 20 MG tablet Take 20 mg by mouth at bedtime. 03/09/20   [provider]  Pimavanserin Tartrate (NUPLAZID) 10 MG TABS Take 10 mg by mouth daily. Patient not taking: Reported on 11/03/2023 10/22/23   Shawnie Dapper, NP  PRESCRIPTION MEDICATION BiPAP- At bedtime    [provider]  Secukinumab (COSENTYX ) Inject into the vein every 30 (thirty) days.    [provider]  sulindac (CLINORIL) 150 MG tablet Take 150 mg by mouth at bedtime.    [provider]  SYSTANE COMPLETE PF 0.6 % SOLN Place 1 drop into both eyes 4 (four) times daily.    [provider]      Allergies    Patient has no known allergies.    Review of Systems   Review of Systems  Physical Exam Updated Vital Signs BP (!) 140/66   Pulse 95   Temp 98.6 F (37 C) (Oral)  Resp 15   SpO2 100%  Physical Exam Vitals and nursing note reviewed.  Constitutional:      General: He is not in acute distress.    Appearance: He is well-developed.  HENT:     Head: Normocephalic and atraumatic.  Eyes:     Conjunctiva/sclera: Conjunctivae normal.     Pupils: Pupils are equal, round, and reactive to light.  Cardiovascular:     Rate and Rhythm: Normal rate and regular rhythm.     Heart sounds: No murmur heard. Pulmonary:     Effort: Pulmonary effort is normal. No respiratory distress.     Breath sounds: Normal breath sounds. No wheezing or rales.  Abdominal:     General: There is no distension.     Palpations: Abdomen is soft.     Tenderness: There is no abdominal tenderness. There is no guarding or rebound.   Musculoskeletal:        General: No tenderness. Normal range of motion.       Arms:     Cervical back: Normal range of motion and neck supple. No tenderness.     Comments: 3 cm laceration through the subcutaneous tissue just distal to the elbow.  Bleeding is controlled  Skin:    General: Skin is warm and dry.     Findings: No erythema or rash.  Neurological:     Mental Status: He is alert and oriented to person, place, and time.     Comments: Resting tremor in bilateral upper extremities.  Rigidity with movement  Psychiatric:        Behavior: Behavior normal.     ED Results / Procedures / Treatments   Labs (all labs ordered are listed, but only abnormal results are displayed) Labs Reviewed - No data to display  EKG None  Radiology DG Forearm Left Result Date: 11/04/2023 CLINICAL DATA:  Pain after fall EXAM: LEFT FOREARM - 2 VIEW COMPARISON:  None Available. FINDINGS: Osteopenia. No fracture or dislocation. Preserved adjacent joint spaces. There are degenerative changes seen of the wrist at the edge of the imaging field. IMPRESSION: No acute osseous abnormality. Electronically Signed   By: Karen Kays M.D.   On: 11/04/2023 17:57    Procedures Procedures   LACERATION REPAIR Performed by: [facility] Authorized by: Gwyneth Sprout Consent: Verbal consent obtained. Risks and benefits: risks, benefits and alternatives were discussed Consent given by: patient Patient identity confirmed: provided demographic data Prepped and Draped in normal sterile fashion Wound explored  Laceration Location: left forearm  Laceration Length: 3cm  No Foreign Bodies seen or palpated  Anesthesia: local infiltration  Local anesthetic: lidocaine 2% with epinephrine  Anesthetic total: 4 ml  Irrigation method: syringe Amount of cleaning: standard  Skin closure: 4.0 vicryl rapide  Number of sutures: 9  Technique: simple interrupted  Patient tolerance: Patient tolerated the  procedure well with no immediate complications.  Medications Ordered in ED Medications  lidocaine-EPINEPHrine (XYLOCAINE W/EPI) 2 %-1:200000 (PF) injection 10 mL (10 mLs Infiltration Given by Other 11/04/23 1814)  Tdap (BOOSTRIX) injection 0.5 mL (0.5 mLs Intramuscular Given 11/04/23 1814)    ED Course/ Medical Decision Making/ A&P                                 Medical Decision Making Amount and/or Complexity of Data Reviewed Radiology: ordered and independent interpretation performed. Decision-making details documented in ED Course.  Risk Prescription drug management.  Pt with multiple medical problems and comorbidities and presenting today with a complaint that caries a high risk for morbidity and mortality.  Here today after a fall with injury to the left arm and laceration.  Patient thinks he may have hit his head minimally on the bookshelf when he went down but no obvious signs of head injury on exam and no headache or neck pain.  Fall occurred over 12 hours ago and low suspicion for intracranial injury.  Patient does not take any anticoagulation.  Tetanus shot updated.  I have independently visualized and interpreted pt's images today. Forearm images negative today.  Wound repaired as above.  At this time patient does not require any further testing or imaging and is stable for discharge home.         Final Clinical Impression(s) / ED Diagnoses Final diagnoses:  Laceration of left forearm, initial encounter    Rx / DC Orders ED Discharge Orders     None         Gwyneth Sprout, MD 11/04/23 1856

## 2023-11-04 NOTE — Discharge Instructions (Addendum)
 It is okay to bathe then the stitches can get wet just do not soak them or scrub on them.  They will dissolve in within 7 to 10 days they should be loosening up and be able to be pulled out or they will eventually fall off.  You can place Vaseline over the wound and then a bandage.  It can be open to the air if you are not going to hit it on anything.

## 2023-11-04 NOTE — ED Triage Notes (Signed)
 Pt bib spouse with c/o fall at 0300 today. Pt endorse bleeding to LT elbow. Denies thinners. 4-5cm un-approximated lac noted to distal UE to elbow, bandage applied, bleeding controlled. Wife reports pt fell into corner, -loc, Head hit bookcase

## 2023-11-13 DIAGNOSIS — M1991 Primary osteoarthritis, unspecified site: Secondary | ICD-10-CM | POA: Diagnosis not present

## 2023-11-13 DIAGNOSIS — G20A1 Parkinson's disease without dyskinesia, without mention of fluctuations: Secondary | ICD-10-CM | POA: Diagnosis not present

## 2023-11-13 DIAGNOSIS — Z6824 Body mass index (BMI) 24.0-24.9, adult: Secondary | ICD-10-CM | POA: Diagnosis not present

## 2023-11-13 DIAGNOSIS — M459 Ankylosing spondylitis of unspecified sites in spine: Secondary | ICD-10-CM | POA: Diagnosis not present

## 2023-11-13 DIAGNOSIS — Z79899 Other long term (current) drug therapy: Secondary | ICD-10-CM | POA: Diagnosis not present

## 2023-11-19 DIAGNOSIS — M459 Ankylosing spondylitis of unspecified sites in spine: Secondary | ICD-10-CM | POA: Diagnosis not present

## 2023-11-19 DIAGNOSIS — R5383 Other fatigue: Secondary | ICD-10-CM | POA: Diagnosis not present

## 2023-11-19 DIAGNOSIS — Z79899 Other long term (current) drug therapy: Secondary | ICD-10-CM | POA: Diagnosis not present

## 2023-11-27 ENCOUNTER — Other Ambulatory Visit: Payer: Self-pay | Admitting: Neurology

## 2023-11-27 DIAGNOSIS — N318 Other neuromuscular dysfunction of bladder: Secondary | ICD-10-CM

## 2023-11-27 DIAGNOSIS — Z9181 History of falling: Secondary | ICD-10-CM

## 2023-11-27 DIAGNOSIS — Z789 Other specified health status: Secondary | ICD-10-CM

## 2023-11-27 DIAGNOSIS — K5909 Other constipation: Secondary | ICD-10-CM

## 2023-11-27 DIAGNOSIS — R351 Nocturia: Secondary | ICD-10-CM

## 2023-11-27 DIAGNOSIS — G20C Parkinsonism, unspecified: Secondary | ICD-10-CM

## 2023-12-04 DIAGNOSIS — B351 Tinea unguium: Secondary | ICD-10-CM | POA: Diagnosis not present

## 2023-12-04 DIAGNOSIS — M79674 Pain in right toe(s): Secondary | ICD-10-CM | POA: Diagnosis not present

## 2023-12-04 DIAGNOSIS — M79675 Pain in left toe(s): Secondary | ICD-10-CM | POA: Diagnosis not present

## 2023-12-09 ENCOUNTER — Telehealth: Payer: Self-pay | Admitting: Occupational Therapy

## 2023-12-09 DIAGNOSIS — R2681 Unsteadiness on feet: Secondary | ICD-10-CM

## 2023-12-09 DIAGNOSIS — G20A2 Parkinson's disease without dyskinesia, with fluctuations: Secondary | ICD-10-CM

## 2023-12-09 DIAGNOSIS — R293 Abnormal posture: Secondary | ICD-10-CM

## 2023-12-09 DIAGNOSIS — R278 Other lack of coordination: Secondary | ICD-10-CM

## 2023-12-09 NOTE — Telephone Encounter (Signed)
 Referral placed to neuro rehab as recommended.

## 2023-12-09 NOTE — Telephone Encounter (Signed)
 Dr. Omar Bibber, Per discharge summary with OT and PT, recommendation was made for return eval in 3 months due to chronic progression of his disease and frequent falls.    If you agree, please send referral via Epic for OT eval and treat and PT eval and treat to [facility].  Thank you.   Anthonette Kinsman, OT  [facility] at [facility] [address] [place], [address] [place], [place] [address] Phone # [phone] Fax # [phone]

## 2023-12-17 DIAGNOSIS — M454 Ankylosing spondylitis of thoracic region: Secondary | ICD-10-CM | POA: Diagnosis not present

## 2024-01-07 DIAGNOSIS — E039 Hypothyroidism, unspecified: Secondary | ICD-10-CM | POA: Diagnosis not present

## 2024-01-07 DIAGNOSIS — E785 Hyperlipidemia, unspecified: Secondary | ICD-10-CM | POA: Diagnosis not present

## 2024-01-07 DIAGNOSIS — N400 Enlarged prostate without lower urinary tract symptoms: Secondary | ICD-10-CM | POA: Diagnosis not present

## 2024-01-07 DIAGNOSIS — N1831 Chronic kidney disease, stage 3a: Secondary | ICD-10-CM | POA: Diagnosis not present

## 2024-01-07 DIAGNOSIS — I129 Hypertensive chronic kidney disease with stage 1 through stage 4 chronic kidney disease, or unspecified chronic kidney disease: Secondary | ICD-10-CM | POA: Diagnosis not present

## 2024-01-08 ENCOUNTER — Ambulatory Visit: Payer: Medicare Other | Admitting: Neurology

## 2024-01-14 DIAGNOSIS — M459 Ankylosing spondylitis of unspecified sites in spine: Secondary | ICD-10-CM | POA: Diagnosis not present

## 2024-01-15 DIAGNOSIS — R269 Unspecified abnormalities of gait and mobility: Secondary | ICD-10-CM | POA: Diagnosis not present

## 2024-01-15 DIAGNOSIS — E785 Hyperlipidemia, unspecified: Secondary | ICD-10-CM | POA: Diagnosis not present

## 2024-01-15 DIAGNOSIS — I10 Essential (primary) hypertension: Secondary | ICD-10-CM | POA: Diagnosis not present

## 2024-01-15 DIAGNOSIS — N1831 Chronic kidney disease, stage 3a: Secondary | ICD-10-CM | POA: Diagnosis not present

## 2024-01-15 DIAGNOSIS — E039 Hypothyroidism, unspecified: Secondary | ICD-10-CM | POA: Diagnosis not present

## 2024-01-15 DIAGNOSIS — R296 Repeated falls: Secondary | ICD-10-CM | POA: Diagnosis not present

## 2024-01-15 DIAGNOSIS — R82998 Other abnormal findings in urine: Secondary | ICD-10-CM | POA: Diagnosis not present

## 2024-01-15 DIAGNOSIS — G4733 Obstructive sleep apnea (adult) (pediatric): Secondary | ICD-10-CM | POA: Diagnosis not present

## 2024-01-15 DIAGNOSIS — I129 Hypertensive chronic kidney disease with stage 1 through stage 4 chronic kidney disease, or unspecified chronic kidney disease: Secondary | ICD-10-CM | POA: Diagnosis not present

## 2024-01-15 DIAGNOSIS — I6523 Occlusion and stenosis of bilateral carotid arteries: Secondary | ICD-10-CM | POA: Diagnosis not present

## 2024-01-15 DIAGNOSIS — G20A1 Parkinson's disease without dyskinesia, without mention of fluctuations: Secondary | ICD-10-CM | POA: Diagnosis not present

## 2024-01-15 DIAGNOSIS — Z0000 Encounter for general adult medical examination without abnormal findings: Secondary | ICD-10-CM | POA: Diagnosis not present

## 2024-01-15 DIAGNOSIS — M455 Ankylosing spondylitis of thoracolumbar region: Secondary | ICD-10-CM | POA: Diagnosis not present

## 2024-01-19 ENCOUNTER — Other Ambulatory Visit: Payer: Self-pay | Admitting: Cardiology

## 2024-01-20 ENCOUNTER — Other Ambulatory Visit: Payer: Self-pay

## 2024-01-20 ENCOUNTER — Ambulatory Visit: Payer: Medicare Other | Attending: Neurology | Admitting: Occupational Therapy

## 2024-01-20 ENCOUNTER — Ambulatory Visit: Payer: Medicare Other

## 2024-01-20 DIAGNOSIS — R2681 Unsteadiness on feet: Secondary | ICD-10-CM | POA: Insufficient documentation

## 2024-01-20 DIAGNOSIS — R293 Abnormal posture: Secondary | ICD-10-CM

## 2024-01-20 DIAGNOSIS — R2689 Other abnormalities of gait and mobility: Secondary | ICD-10-CM | POA: Insufficient documentation

## 2024-01-20 DIAGNOSIS — G20A2 Parkinson's disease without dyskinesia, with fluctuations: Secondary | ICD-10-CM | POA: Diagnosis not present

## 2024-01-20 DIAGNOSIS — R278 Other lack of coordination: Secondary | ICD-10-CM | POA: Diagnosis not present

## 2024-01-20 DIAGNOSIS — M6281 Muscle weakness (generalized): Secondary | ICD-10-CM | POA: Diagnosis not present

## 2024-01-20 DIAGNOSIS — R29818 Other symptoms and signs involving the nervous system: Secondary | ICD-10-CM | POA: Diagnosis not present

## 2024-01-20 NOTE — Therapy (Signed)
 OUTPATIENT OCCUPATIONAL THERAPY PARKINSON'S EVALUATION  Patient Name: [name] MRN: [id number] DOB:01/28/1936, 88 y.o., male Today's Date: 01/20/2024  PCP: Bertha Broad, MD REFERRING PROVIDER: Debbra Fairy, MD  END OF SESSION:  OT End of Session - 01/20/24 1550     Visit Number 1    Number of Visits 7    Date for OT Re-Evaluation 03/05/24    Authorization Type Medicare A &B    OT Start Time 1403    OT Stop Time 1448    OT Time Calculation (min) 45 min             Past Medical History:  Diagnosis Date   Ankylosing spondylitis (HCC) dx'd ~ 1974   Arthritis    "back" (08/10/2015)   BENIGN PROSTATIC HYPERTROPHY, WITH OBSTRUCTION 01/15/2010   Bleeding duodenal ulcer    Bleeding esophageal ulcer    Bleeding stomach ulcer    GERD 02/22/2008   History of blood transfusion 08/26/1986   "lost ~ 1/2 of my blood volume from multiple bleeding ulcers"   History of hiatal hernia    HYPERGLYCEMIA 11/18/2007   HYPERLIPIDEMIA 02/22/2008   HYPERTENSION, UNSPECIFIED 11/10/2009   Kidney stones    Osteoarthritis    c-spine   Parkinson's disease (HCC)    PEPTIC ULCER DISEASE 11/17/2007   Situational depression    "son died in MVA 06/11/2015"   TIA (transient ischemic attack)    "not that I know of in the past; they are trying to determine if I've had one today (08/10/2015)"   Past Surgical History:  Procedure Laterality Date   CATARACT EXTRACTION W/ INTRAOCULAR LENS  IMPLANT, BILATERAL Bilateral 2013   Patient Active Problem List   Diagnosis Date Noted   Ankylosing spondylitis of cervicothoracic region (HCC) 10/16/2016   TIA (transient ischemic attack) 08/10/2015   Carotid stenosis 08/04/2014   Hyperlipidemia 02/21/2012   BENIGN PROSTATIC HYPERTROPHY, WITH OBSTRUCTION 01/15/2010   Essential hypertension 11/10/2009   GERD 02/22/2008   NEPHROLITHIASIS, HX OF 11/17/2007    ONSET DATE: referral date 12/09/23  REFERRING DIAG: R26.81 (ICD-10-CM) - Unsteadiness on feet  R27.8 (ICD-10-CM) - Other lack of coordination R29.3 (ICD-10-CM) - Abnormal posture G20.A2 (ICD-10-CM) - Parkinson's disease without dyskinesia, with fluctuations  THERAPY DIAG:  Other symptoms and signs involving the nervous system  Muscle weakness (generalized)  Unsteadiness on feet  Other lack of coordination  Other abnormalities of gait and mobility  Rationale for Evaluation and Treatment: Rehabilitation  SUBJECTIVE:   SUBJECTIVE STATEMENT: Pt's spouse reports that he has become very frightened about getting in to the shower, therefore is utilizing shower chair seated at the sink but is able to complete mostly on his own.  As of last week, "we have wider doors in both of the bathrooms".  The use of the U-step has widened "my ability to do more".  Pt reports turning more smoothly, when turning to the right.  Pt and spouse report that pt has been walking longer distances at the park, going to the gym ~1x/week.  Pt accompanied by: self and significant other (spouse, Hazen Liter)  PERTINENT HISTORY: history of Parkinson's disease, duodenal and esophageal ulcers, ankylosing spondylitis, hypertension and hyperlipidemia  PRECAUTIONS: Fall  WEIGHT BEARING RESTRICTIONS: No  PAIN:  Are you having pain? No  FALLS: Has patient fallen in last 3 months? Yes. Number of falls 5-6 fall; however 2-3 falls where he has needed to have assist to get him up or go to ED  LIVING ENVIRONMENT: Lives with: lives  with their spouse Lives in: House/apartment Stairs: Yes: External: 2-3 steps; has a ramp at entrance with raised railings Internal: steps to basement and steps to loft - however pt does not need to go up/down those at this time. Has following equipment at home: Otho Blitz - 2 wheeled, Environmental consultant - 4 wheeled, U-step, bed side commode, Shower chair, Ramped entry, and transport chair  PLOF: Requires assistive device for independence and Needs assistance with ADLs  PATIENT GOALS: feel safe to get in to the  shower  OBJECTIVE:  Note: Objective measures were completed at Evaluation unless otherwise noted.  HAND DOMINANCE: Right  ADLs: Overall ADLs: Pt reports there are times that he can be "fairly independent" with dressing but there are times where spouse has to provide significant assistance. Transfers/ambulation related to ADLs: Utilizing U-step for mobility in and outside of the home Eating: "fumble fingered" UB Dressing: occasional assistance LB Dressing: sock and shoes continue to be difficult Toileting: Pt will occasionally require assistance to get up from toilet Bathing: sponge bathing at sink without assistance from shower chair seated at sink, spouse assisting with washing hair; Setup for washing feet Tub Shower transfers: not currently performing Equipment: Shower seat with back and Walk in shower with 5.5" ledge with 30" door  IADLs: Light housekeeping: washes dishes when he is feeling strong, however frequent onset of weakness Meal Prep: Pt was primary cook prior to fall in 2023, but has returned to participation in aspects of meal prep Community mobility: recently renewed drivers license, but is not driving  MOBILITY STATUS: Needs Assist: CGA with mobility with RW and Hx of falls   POSTURE COMMENTS:  rounded shoulders and forward head, posterior pelvic tilt and R lean  FUNCTIONAL OUTCOME MEASURES: Physical performance test: PPT#2 (simulated eating) 17.41 & PPT#4 (donning/doffing jacket): TBD  COORDINATION: 9 Hole Peg test: Right: 38.44 sec; Left: 61.81 sec  UE ROM:  all movements are slowed, decreased shoulder flexion d/t ankylosing spondylitis   UE MMT:   grossly 4/5 overall  COGNITION: Overall cognitive status: Spouse reports thinking continues to be slower  OBSERVATIONS: Bradykinesia                                                                                                                    TREATMENT DATE:  01/20/24 Educated on DME vs AE to increase  ease and independence with bathroom transfers and getting in/out of car.  Pt's spouse reports that she planned to purchase a car door handle/assist but has not yet.  Spouse asking about max height of toilet, as he still has difficulty getting up from [facility] over toilet as well as occasional spillage due to poor alignment of BSC over toilet seat.  OT discussed toilet rails as well as possibility of positioning RW over toilet seat to allow for hand holds to be hand holds to aid in pushing up from toilet, providing demonstration.  Pt's spouse to look into purchasing max height toilet.     PATIENT EDUCATION: Education details: Educated  on role and purpose of OT as well as potential interventions and goals for therapy based on initial evaluation findings. Person educated: Patient and Spouse Education method: Explanation Education comprehension: verbalized understanding and needs further education  HOME EXERCISE PROGRAM: TBD  GOALS: Goals reviewed with patient? Yes  SHORT TERM GOALS: Target date: 02/13/24  Pt and spouse will be independent in full body HEP with focus on large amplitude movements as needed to maintain/preserve function as pertaining to ADLs/IADLs.  Baseline: Goal status: INITIAL  2.  Pt will demonstrate ability to complete sit > stand as needed to get up from toilet or other seating options in home by completing x3 throughout session without cues or physical assistance to maintain/preserve function.  Baseline:  Goal status: INITIAL  3.  Pt will demonstrate improved functional use of BUE and/or adaptive techniques to aid in increased independence with phone use and tv remove use. Baseline:  Goal status: INITIAL   LONG TERM GOALS: Target date: 03/05/24  Pt and spouse will report understanding of DME vs adaptive techniques/strategies and/or modifications to routines to increase safety with mobility and self-care tasks in the home.  Baseline:  Goal status: INITIAL  2.  Pt will  report ability to complete UB/LB dressing at Supervision/setup level with use of AE and/or alternative strategies PRN.  Baseline:  Goal status: INITIAL  3.  Pt will demonstrate improved independence with stand pivot transfers to carry over to shower transfers with supervision. Baseline:  Goal status: INITIAL  4.  Pt will demonstrate improved fine motor coordination for ADLs as evidenced by decreasing 9 hole peg test score for LUE by 10 secs Baseline: Right: 38.44 sec; Left: 61.81 sec Goal status: INITIAL  ASSESSMENT:  CLINICAL IMPRESSION: Patient is a 88 y.o. male who was seen today for occupational therapy evaluation for advancements in Parkinson's disease, increased falls, and decreased independence with ADLs/IADLs.  Pt lives with spouse in home where he can reside on main floor with ramped entrance and recently widened bathroom doors.  Pt continues to require significantly increased time to complete aspects or dressing and/or physical assistance from spouse, particularly with underwear, socks, and shoes as well as buttons. Pt will benefit from skilled occupational therapy services to address strength and coordination, ROM, pain management, balance, GM/FM control, cognition, safety awareness, introduction of compensatory strategies/AE prn, and implementation of an HEP to improve participation and safety during ADLs and IADLs and quality of life.   PERFORMANCE DEFICITS:  in functional skills including ADLs, IADLs, coordination, dexterity, ROM, strength, pain, flexibility, Fine motor control, Gross motor control, balance, body mechanics, endurance, decreased knowledge of precautions, decreased knowledge of use of DME, and UE functional use and psychosocial skills including coping strategies, environmental adaptation, and routines and behaviors.   IMPAIRMENTS: are limiting patient from ADLs and IADLs.   COMORBIDITIES:  may have co-morbidities  that affects occupational performance. Patient will  benefit from skilled OT to address above impairments and improve overall function.  MODIFICATION OR ASSISTANCE TO COMPLETE EVALUATION: Min-Moderate modification of tasks or assist with assess necessary to complete an evaluation.  OT OCCUPATIONAL PROFILE AND HISTORY: Detailed assessment: Review of records and additional review of physical, cognitive, psychosocial history related to current functional performance.  CLINICAL DECISION MAKING: Moderate - several treatment options, min-mod task modification necessary  REHAB POTENTIAL: Good  EVALUATION COMPLEXITY: Moderate    PLAN:  OT FREQUENCY: 1x/week  OT DURATION: 6 weeks  PLANNED INTERVENTIONS: 97168 OT Re-evaluation, 97535 self care/ADL training, 16010 therapeutic exercise, 97530  therapeutic activity, 97112 neuromuscular re-education, 97140 manual therapy, balance training, functional mobility training, psychosocial skills training, energy conservation, coping strategies training, patient/family education, and DME and/or AE instructions  RECOMMENDED OTHER SERVICES: NA  CONSULTED AND AGREED WITH PLAN OF CARE: Patient and family member/caregiver  PLAN FOR NEXT SESSION: complete box and blocks assessment, problem solve bathroom transfers and recommendation for alternative DME, coordination for use of phone and TV remote   Anthonette Kinsman, OTR/L 01/20/2024, 3:51 PM   [facility] at [facility] [address], [address] [place], [place] [address] Phone # [phone] Fax # [phone]

## 2024-01-20 NOTE — Therapy (Signed)
 OUTPATIENT PHYSICAL THERAPY NEURO EVALUATION   Patient Name: [name] MRN: [id number] DOB:[birth year], 88 y.o., male Today's Date: 01/20/2024   PCP: Bertha Broad, MD REFERRING PROVIDER: Debbra Fairy, MD  END OF SESSION:  PT End of Session - 01/20/24 1308     Visit Number 1    Number of Visits 9    Date for PT Re-Evaluation 03/16/24    Authorization Type Medicare/[employer]    Progress Note Due on Visit 10    PT Start Time 1315    PT Stop Time 1400    PT Time Calculation (min) 45 min             Past Medical History:  Diagnosis Date   Ankylosing spondylitis (HCC) dx'd ~ 1974   Arthritis    "back" (08/10/2015)   BENIGN PROSTATIC HYPERTROPHY, WITH OBSTRUCTION 01/15/2010   Bleeding duodenal ulcer    Bleeding esophageal ulcer    Bleeding stomach ulcer    GERD 02/22/2008   History of blood transfusion 08/26/1986   "lost ~ 1/2 of my blood volume from multiple bleeding ulcers"   History of hiatal hernia    HYPERGLYCEMIA 11/18/2007   HYPERLIPIDEMIA 02/22/2008   HYPERTENSION, UNSPECIFIED 11/10/2009   Kidney stones    Osteoarthritis    c-spine   Parkinson's disease (HCC)    PEPTIC ULCER DISEASE 11/17/2007   Situational depression    "son died in MVA 25-Jun-2015"   TIA (transient ischemic attack)    "not that I know of in the past; they are trying to determine if I've had one today (08/10/2015)"   Past Surgical History:  Procedure Laterality Date   CATARACT EXTRACTION W/ INTRAOCULAR LENS  IMPLANT, BILATERAL Bilateral 2013   Patient Active Problem List   Diagnosis Date Noted   Ankylosing spondylitis of cervicothoracic region (HCC) 10/16/2016   TIA (transient ischemic attack) 08/10/2015   Carotid stenosis 08/04/2014   Hyperlipidemia 02/21/2012   BENIGN PROSTATIC HYPERTROPHY, WITH OBSTRUCTION 01/15/2010   Essential hypertension 11/10/2009   GERD 02/22/2008   NEPHROLITHIASIS, HX OF 11/17/2007    ONSET DATE: PD x years onset  REFERRING DIAG: R26.81 (ICD-10-CM) -  Unsteadiness on feet R29.3 (ICD-10-CM) - Abnormal posture R27.8 (ICD-10-CM) - Other lack of coordination G20.A2 (ICD-10-CM) - Parkinson's disease with fluctuating manifestations, unspecified whether dyskinesia present (HCC)  THERAPY DIAG:  Unsteadiness on feet  Abnormal posture  Parkinson's disease with fluctuating manifestations, unspecified whether dyskinesia present (HCC)  Other symptoms and signs involving the nervous system  Rationale for Evaluation and Treatment: Rehabilitation  SUBJECTIVE:  SUBJECTIVE STATEMENT: Been using the U-step for walking. Have gone to the park to walk a few times (upwards of a mile). Recently had home modifications to widen doorways to make more AD accessible. Had a fall in March resulting in skin tear to arm. Reports attending local gym 1x/wk for use of recumbent bike and a couple of machines.  Pt accompanied by: significant other  PERTINENT HISTORY: complex medical history of peptic ulcer disease with history of upper GI bleed, TIA, depression, degenerative disc disease in the neck, osteoarthritis, kidney stones, hyperlipidemia, hypertension, history of hiatal hernia, BPH, arthritis, and ankylosing spondylitis, and parkinsonism  PAIN:  Are you having pain? No worse for chronic pain, no acute pain  PRECAUTIONS: Fall  RED FLAGS: None   WEIGHT BEARING RESTRICTIONS: No  FALLS: Has patient fallen in last 3 months? Unsure as to number, notes fall/LOB every other week  LIVING ENVIRONMENT: Lives with: lives with their spouse Lives in: House/apartment Stairs: Ramp to enter, stairs to loft and basement, ground floor set-up Has following equipment at home: [employer] - 2 wheeled  PLOF: Needs assistance with ADLs, Needs assistance with homemaking, and Needs assistance with  gait  PATIENT GOALS:   OBJECTIVE:  Note: Objective measures were completed at Evaluation unless otherwise noted.  DIAGNOSTIC FINDINGS:   COGNITION: Overall cognitive status: Within functional limits for tasks assessed and some instances of difficulty attending to task/sequencing   SENSATION: Not tested  COORDINATION: Deficits to rapid alternating movements    MUSCLE TONE: rigidity  MUSCLE LENGTH: 10-15 degree knee flexion contracture bilat   POSTURE: rounded shoulders, forward head, and increased thoracic kyphosis  LOWER EXTREMITY ROM:     Right ankle ROM limits from fusion  LOWER EXTREMITY MMT:    4/5 gross BLE  BED MOBILITY:  Findings: NT, reports fluctuating performance  TRANSFERS: Sit to stand: Modified independence and CGA  Assistive device utilized: U-Step     Stand to sit: CGA  Assistive device utilized: u-step     Chair to chair: Modified independence and CGA  Assistive device utilized: U-step         CURB:  Findings: CGA-min A  STAIRS: Findings: Comments: CGA-min A GAIT: Findings: Distance walked: 150, Assistive device utilized:U-step, Level of assistance: CGA, and Comments: freezing in turns  FUNCTIONAL TESTS:  5 times sit to stand: 47.25 sec Timed up and go (TUG): 36 sec w/ U-step 10 meter walk test: 23.34 sec = 1.4 ft/sec w/ U-step Berg Balance Scale:  180 degree turns: 16-18 steps  Item Test date: 01/20/24 Test date:  Test date:   Sitting to standing 2. able to stand using hands after several tries Insert OPRCBERGREEVAL SmartPhrase at re-test date Insert OPRCBERGREEVAL SmartPhrase at re-test date  2. Standing unsupported 3. able to stand 2 minutes with supervision    3. Sitting with back unsupported, feet supported 4. able to sit safely and securely for 2 minutes    4. Standing to sitting 2. uses back of legs against chair to control descent    5. Pivot transfer  2. able to transfer with verbal cuing and/or supervision    6. Standing  unsupported with eyes closed 3. able to stand 10 seconds with supervision    7. Standing unsupported with feet together 3. able to place feet together independently and stand 1 minute with supervision    8. Reaching forward with outstretched arms while standing 3. can reach forward 12 cm (5 inches)    9. Pick up object from the  floor from standing 0. unable to try/needs assistance to keep from losing balance or falling    10. Turning to look behind over left and right shoulders while standing 2. turns sideways but only maintains balance    11. Turn 360 degrees 0. needs assistance while turning    12. Place alternate foot on step or stool while standing unsupported 1. able to complete > 2 steps needs minimal assist    13. Standing unsupported one foot in front 0. loses balance while stepping or standing    14. Standing on one leg 0. unable to try of needs assist to prevent fall      Total Score 25/56 Total Score /56 Total Score /56     PATIENT SURVEYS:  Freezing of gait questionnaire provided to fill out and return                                                                                                                              TREATMENT DATE:     PATIENT EDUCATION:a Education details: assessment details, rationale of tx interventions Person educated: Patient and Spouse Education method: Explanation Education comprehension: verbalized understanding  HOME EXERCISE PROGRAM: TBD  GOALS: Goals reviewed with patient? Yes  SHORT TERM GOALS: Target date: 02/17/2024    Patient will perform HEP with family/caregiver supervision for improved strength, balance, transfers, and gait  Baseline: Goal status: INITIAL  2.  Demo improved postural control and BLE strength per time 35 sec 5xSTS test Baseline: 47 sec Goal status: INITIAL    LONG TERM GOALS: Target date: 03/16/2024    Demo improved static strength and safety with ADL per score 40/56 Berg Balance Test Baseline:  25/56 Goal status: INITIAL  2.  Demo improved safety/efficiency of turning negotiating 180 degree turn in 10-12 steps Baseline: 16-18 w/ U-step Goal status: INITIAL  3.  Demo improved safety with mobility per time 25 sec TUG test Baseline: 36 sec w/ U-step Goal status: INITIAL  4.  Spouse to teach-back strategies to help with reducing freezing of gait Baseline:  Goal status: INITIAL   ASSESSMENT:  CLINICAL IMPRESSION: Patient is a 88 y.o. male who was seen today for physical therapy evaluation and treatment for deficits and limitations related to PD.  Exhibits bradykinesia, postural deviations, balance/motor control deficits, generalized weakness, high risk for falls per outcome measures, and increased unsteadiness in gait w/ freezing of gait being experienced. Pt would benefit from PT services to improve functional mobility and provide relevant strategies for adaptation/compensation to reduce risk for falls and improve self-management strategies.    OBJECTIVE IMPAIRMENTS: Abnormal gait, decreased activity tolerance, decreased balance, decreased coordination, decreased mobility, difficulty walking, decreased strength, improper body mechanics, and postural dysfunction.   ACTIVITY LIMITATIONS: carrying, lifting, bending, standing, squatting, stairs, transfers, bed mobility, reach over head, and locomotion level  PARTICIPATION LIMITATIONS: meal prep, cleaning, shopping, community activity, and ADL  PERSONAL FACTORS: Age, Time since onset of injury/illness/exacerbation, and 3+ comorbidities:  PMH are also affecting patient's functional outcome.   REHAB POTENTIAL: Good  CLINICAL DECISION MAKING: Unstable/unpredictable  EVALUATION COMPLEXITY: High  PLAN:  PT FREQUENCY: 1x/week  PT DURATION: 8 weeks  PLANNED INTERVENTIONS: 97750- Physical Performance Testing, 97110-Therapeutic exercises, 97530- Therapeutic activity, [id number]- Neuromuscular re-education, 97535- Self Care, [id number]- Manual  therapy, [id number]- Gait training, and [phone]- Aquatic Therapy  PLAN FOR NEXT SESSION: freezing of gait questionnaire, clock-face turns for FoG? (Trying to come up with activities that spouse can practice for turns/freezing of gait)   3:03 PM, 01/20/24 M. Kelly Crystie Yanko, PT, DPT Physical Therapist- [place] Office Number: [phone]

## 2024-01-28 ENCOUNTER — Encounter: Payer: Self-pay | Admitting: Neurology

## 2024-01-28 ENCOUNTER — Ambulatory Visit (INDEPENDENT_AMBULATORY_CARE_PROVIDER_SITE_OTHER): Admitting: Neurology

## 2024-01-28 VITALS — BP 131/69 | HR 91 | Ht 67.0 in | Wt 157.2 lb

## 2024-01-28 DIAGNOSIS — K117 Disturbances of salivary secretion: Secondary | ICD-10-CM

## 2024-01-28 DIAGNOSIS — G4733 Obstructive sleep apnea (adult) (pediatric): Secondary | ICD-10-CM

## 2024-01-28 DIAGNOSIS — R296 Repeated falls: Secondary | ICD-10-CM | POA: Diagnosis not present

## 2024-01-28 DIAGNOSIS — R443 Hallucinations, unspecified: Secondary | ICD-10-CM | POA: Diagnosis not present

## 2024-01-28 DIAGNOSIS — Z789 Other specified health status: Secondary | ICD-10-CM | POA: Diagnosis not present

## 2024-01-28 DIAGNOSIS — G20C Parkinsonism, unspecified: Secondary | ICD-10-CM | POA: Diagnosis not present

## 2024-01-28 DIAGNOSIS — R2689 Other abnormalities of gait and mobility: Secondary | ICD-10-CM | POA: Diagnosis not present

## 2024-01-28 NOTE — Progress Notes (Signed)
 Subjective:    Patient ID: [name] is a 88 y.o. male.  HPI    Interim history:  [name] is an 88 year old right-handed gentleman with an underlying complex medical history of peptic ulcer disease with history of upper GI bleed, TIA, depression, degenerative disc disease in the neck, osteoarthritis, kidney stones, hyperlipidemia, hypertension, history of hiatal hernia, BPH, arthritis, and ankylosing spondylitis, and parkinsonism, who presents for follow-up of his parkinsonism, complicated by recurrent falls and sleep disruption from nocturia and OSA with PAP intolerance. The patient is accompanied by his wife again today.  He was seen by Terrilyn Fick in February 2025, at which time he reported several falls.  He was prescribed a U step walker and Nuplazid  was discussed and ordered but he never started it.  He reported difficulty tolerating AutoPap therapy.  Today, 01/28/2024: He reports ongoing issues with significant drooling but does not wish to pursue Botox injections.  He never started Nuplazid , they have the form at home but are reluctant to proceed due to side effect concerns.  His wife believes that his nocturnal vivid dreams and hallucinations are a little bit less.  He continues to take his Sinemet .  He has been using a U step walker for the past few weeks and has done well with it.  It really has helped his balance.  He is trying to use his AutoPap as best as possible.  He has had severe constipation and takes Dulcolax or MiraLAX and also with stool softener alternating but has gone as long as a week without a bowel movement.  He is going to start physical therapy once a week for 8 weeks and also OT.  Once he started using the U step walker, they needed an adjustment to the door frames in the house and at least for the bathrooms they have been able to get someone to widen their door frames.  I reviewed his AutoPap compliance data and it is suboptimal, he does try to put it on every night or  almost every night but does not keep it on throughout the night, average usage of only a little over 2 hours, residual AHI elevated in the teens.  Leak is quite high from the mask.  He is tired during the day.  For his drooling, his PCP had suggested a spray topically in his mouth but he has not tried it yet.   Previously:    10/20/23 (Amy Lomax, NP): <<[name] returns for follow up for PD. He was last seen by Dr Omar Bibber 03/2023 following hospitalization for recurrent falls, cognitive decline and hallucinations. He was referred to PT/OT and HH. Carb-levo 1 tablet five times daily continued. Since, he continues to have progressive symptoms of PD. He has had multiple falls since last visit. Last fall 09/10/2023 resulted in a head injury requiring suture in ER. He had been seen the day prior with another fall where he fractured a rib. He continues PT/OT. HH deferred. U Step walker seemed to help with stability. Order needed. Sialorrhea continues to be bothersome. He denies difficulty swallowing. Eating normally. He is sleeping more. He does continue to describe visual hallucinations as well as some delusional thoughts. He is bothered by these as he is afraid others will think he is crazy. He has not used CPAP recently for OSA due to head injury. >> I saw him on 07/03/2022, at which time he reported overall weakness and difficulty standing and walking at times.  He was sleeping in the  recliner.  He was not using his AutoPap since he had sustained a forehead laceration.  He had fallen in October 2023 and required stitches to the forehead.  He had more mobility decline since then.  He was advised to consult with urology for nocturia.  I made a referral to neurorehab for PT and OT.  He was advised to continue with generic Sinemet  1 pill 5 times a day and take MiraLAX for constipation control.   04/10/2023: He reports feeling weaker overall.  He has had falls.  He has had instances where he could not stop his movement as he  fell backwards.  He tries to walk around in the house on a regular basis, sometimes he does not use his 2 wheeled walker.  He usually gets 4 doses of Sinemet  and on a regular day, sometimes all 5 doses.  He has had more drooling, he has occasional visual hallucinations but nothing alarming per se.    He presented to the emergency room on 03/03/2023 with recurrent falls as well as hallucinations.  I reviewed the emergency room records.  He had a head CT without contrast and cervical spine CT without contrast on 03/03/2023 and I reviewed the report: Impression: No evidence of acute intracranial or cervical spine injury.  He also had a CT of the chest, abdomen and pelvis with contrast on 03/03/2023 and I reviewed the results:   IMPRESSION: 1. No evidence of acute traumatic injury to the chest, abdomen, or pelvis. 2. Mild inferior endplate compression fracture of T3 has a chronic appearance. 3.  Aortic Atherosclerosis (ICD10-I70.0).   He had a CT of the lumbar spine without contrast on 03/03/2023 and I reviewed the results: Impression: 1. No acute/traumatic lumbar spine pathology. 2. Bilateral L5 pars defects. No listhesis.    Laboratory testing showed mildly elevated BUN at 27 on chemistry profile, otherwise benign findings on CMP, CK level 112, urinalysis negative for UTI, CBC with differential showed below normal RBC at 3.7, hemoglobin below normal at 12.5, hematocrit below normal at 37.3, MCV borderline above normal at 100.5. He saw Terrilyn Fick, NP on 10/24/2022, at which time he reported recurrent falls.  He had worked with PT and OT.  He had issues with sialorrhea.  He was supposed to see ST.  Memory was overall stable.  He was advised to restart using his AutoPap consistently, he needed a new DME and a new mask.  He requested a sooner appointment in the interim.    I saw him on 01/24/2022, at which time he was compliant with his AutoPap therapy, residual AHI was still elevated and he was still having trouble  with mask seal.  He was using Sinemet  4 times a day.     I saw him on 09/26/2021, at which time he reported that his tremor was worse.  He reported having fallen.  He was compliant with his AutoPap but his leak was high.  I requested a mask fit appointment through his DME company.  We added Sinemet  CR at bedtime and kept him on Sinemet  IR 1 pill 4 times a day during the day.    He had side effects from the CR and we discontinued the C/L ER in March 2023.   I reviewed his AutoPap compliance data from 12/24/2021 through 01/22/2022, which is a total of 30 days, during which time he used his machine every night with percent use days greater than 4 hours at 83%, indicating very good compliance with an average  usage of 5 hours and 10 minutes, residual AHI slightly better than last time at 15.8/h but still elevated in the moderate range, average pressure for the 95th percentile at 8.9 cm with a range of 6 to 11 cm with EPR.  Leak still high consistently, perhaps slightly better compared to last time at 93.7 L/min for the 95th percentile.     I saw him on 05/03/21, at which time he was slightly suboptimal with his AutoPap.  His AHI was also elevated around 10/h.  He was advised to make an appointment with his DME provider for mask refit as his leak was also high.  We talked about the importance of staying active and about fall prevention.  He was advised to continue with Sinemet  1 pill 4 times daily.     I reviewed his AutoPap compliance data from 08/26/2021 through 09/24/2021, total of 30 days, during which time he used his machine every night with percent use days greater than 4 hours at 73%, indicating adequate compliance, average usage of 4 hours and 43 minutes, residual AHI elevated at 18.5 with no obvious significant central component.  Average pressure for the 95th percentile at 7.7 cm with a very high leak consistently at 108 L/min, pressure range of 6 to 11 cm with EPR.     I saw him on 10/18/2020, at which time  he reported feeling stable with regards to his parkinsonian symptoms and he was tolerating Sinemet  1 pill 4 times a day, felt that he benefited from it.  He was willing to start AutoPap therapy.   He had an interim appointment with Terrilyn Fick, NP on 01/25/2021, at which time he was compliant with AutoPap therapy and felt that it was beneficial.   I reviewed his AutoPap compliance data from 04/02/2021 through 05/01/2021, which is a total of 30 days, during which time he used his machine every night with percent use days greater than 4 hours at 67%, indicating slightly suboptimal compliance with an average usage of 4 hours and 39 minutes, residual AHI elevated at 10.8/h, 95th percentile of pressure at 9.6 cm with a range of 6 to 11 cm, leak rather high consistently with the 95th percentile at 60.1 L/min.   I saw him on 05/10/2020, at which time we went over his sleep study results.  He was agreeable to starting AutoPap therapy.       I saw him on 04/17/2020, at which time he reported some decline in his mobility.  He was advised to increase C/L to 4 times a day. His wife reported that he had some irregularities in his breathing while asleep, also some snoring, he reported congestion and feeling of excess mucus.  He was advised to proceed with a sleep study.  He had a baseline sleep study on 04/26/2020 and we reviewed test results today.  Sleep efficiency was reduced at 64%, sleep latency 43.5 minutes, REM latency 209.5 minutes.  He had a significantly reduced percentage of REM sleep at 6.2, he had moderate to severe sleep fragmentation, wake after sleep onset was 140 minutes.  Total AHI was 18.3/h.  Supine AHI was 19.8/h.  Average oxygen saturation was 98%, nadir was 87%.  He had mild PLM's no significant arousals.  He was advised to proceed with treatment at home in the form of AutoPap.  He requested a follow-up appointment to discuss.      I saw him on 03/24/2019, at which time he reported doing okay with Sinemet , he  was taking it 3 times a day.  He had noticed some benefit including improvement in his mobility and balance.  He felt that perhaps his left hand tremor was a little better.     I spoke to him on 12/23/2018 via phone visit, at which time we mutually agreed to start Sinemet  with gradual titration.  He had to reschedule his DaTscan  secondary to the virus pandemic.  He had his DaTscan  in the interim on 01/07/2019 and I reviewed the results: IMPRESSION: Decreased activity within the LEFT and RIGHT posterior striata is a pattern typical of Parkinson's syndrome pathology.   We called him with his test results.       I first met him on 09/16/2018 at the request of his rheumatology PA, at which time he reported a bilateral hand tremor for the past at least one year, left side more noticeable than right. He had also increased difficulty with mobility. He had some findings concerning for parkinsonism. I suggested we proceed with a nuclear medicine DaT scan but this was delayed secondary to the coronavirus pandemic.    Initial Visit, 09/16/2018: (He) reports bilateral hand tremor, left more than right for the past at least one year with some progression noted. His wife has noted significant impairment in his mobility this time. He has a left more than right tremor, the left hand is the most significant. He has not noticed any lower extremity tremor. He does have restriction in his shoulder mobility, left more than right. He has no family history of Parkinson's disease, does not know much about his mother's history her family history, she died at 40, when he was 88 years old. His paternal grandmother lived to be above 64 years old. He had a sister who died young at 19 from lupus, his brother has significant arthritis issues at age 69. He injured his right ankle and foot when he was a child and continues to have restricted movements in his right ankle. He has fallen a few times. He fell one or 2 times in the last 6  months, one time he fell in the bathroom and one time he fell in the closet. He has had intermittent numbness in both hands, particularly the right hand, particularly with certain movements or posture, such as while holding the steering wheel.    I reviewed your office note from 07/10/2018, which you kindly included. He has also had some balance issues. For his arthritis he takes Cosentyx injections monthly. Of note, he had a brain MRI without contrast as well as MRA head without contrast on 08/10/2015 and I reviewed the results: IMPRESSION: MRI HEAD IMPRESSION:   Negative brain MRI with no acute intracranial infarct or other process identified.   MRA HEAD IMPRESSION:   Negative intracranial MRA. No large or proximal arterial branch occlusion. No high-grade or correctable stenosis.   He is retired, lives with his wife, he quit smoking and drinks alcohol  about once a week, caffeine in the form of coffee, 2-3 cups per day on average.      His Past Medical History Is Significant For: Past Medical History:  Diagnosis Date   Ankylosing spondylitis (HCC) dx'd ~ 1974   Arthritis    "back" (08/10/2015)   BENIGN PROSTATIC HYPERTROPHY, WITH OBSTRUCTION 01/15/2010   Bleeding duodenal ulcer    Bleeding esophageal ulcer    Bleeding stomach ulcer    GERD 02/22/2008   History of blood transfusion 08/26/1986   "lost ~ 1/2 of my blood volume  from multiple bleeding ulcers"   History of hiatal hernia    HYPERGLYCEMIA 11/18/2007   HYPERLIPIDEMIA 02/22/2008   HYPERTENSION, UNSPECIFIED 11/10/2009   Kidney stones    Osteoarthritis    c-spine   Parkinson's disease (HCC)    PEPTIC ULCER DISEASE 11/17/2007   Situational depression    "son died in MVA 06-27-15"   TIA (transient ischemic attack)    "not that I know of in the past; they are trying to determine if I've had one today (08/10/2015)"    His Past Surgical History Is Significant For: Past Surgical History:  Procedure Laterality Date    CATARACT EXTRACTION W/ INTRAOCULAR LENS  IMPLANT, BILATERAL Bilateral 2013    His Family History Is Significant For: Family History  Problem Relation Age of Onset   Appendicitis Mother        complications   Colon cancer Father    Diabetes type II Father    Lupus Sister    Colon cancer Other        1st degree relative <60   Parkinson's disease Neg Hx     His Social History Is Significant For: Social History   Socioeconomic History   Marital status: Married    Spouse name: Not on file   Number of children: Not on file   Years of education: Not on file   Highest education level: Not on file  Occupational History   Not on file  Tobacco Use   Smoking status: Former    Types: Cigars, Pipe    Quit date: 08/26/1978    Years since quitting: 45.4   Smokeless tobacco: Never  Vaping Use   Vaping status: Never Used  Substance and Sexual Activity   Alcohol  use: Never    Comment: 08/10/2015 "glass of wine ~ month or so; occasionally a can of beer"   Drug use: Never   Sexual activity: Not Currently    Partners: Female    Comment: married  Other Topics Concern   Not on file  Social History Narrative   Retired and married.    Social Drivers of Corporate investment banker Strain: Not on file  Food Insecurity: No Food Insecurity (05/30/2023)   Hunger Vital Sign    Worried About Running Out of Food in the Last Year: Never true    Ran Out of Food in the Last Year: Never true  Transportation Needs: No Transportation Needs (05/30/2023)   PRAPARE - Administrator, Civil Service (Medical): No    Lack of Transportation (Non-Medical): No  Physical Activity: Not on file  Stress: Not on file  Social Connections: Not on file    His Allergies Are:  No Known Allergies:   His Current Medications Are:  Outpatient Encounter Medications as of 01/28/2024  Medication Sig   aspirin  81 MG tablet Take 81 mg by mouth daily.     atorvastatin  (LIPITOR) 10 MG tablet TAKE 1 TABLET BY  MOUTH DAILY   carbidopa -levodopa  (SINEMET  IR) 25-100 MG tablet TAKE 1 TABLET BY MOUTH 5 TIMES  DAILY (AT 6 AM, 10 AM, 2 PM, 6  PM, 9 PM DAILY )   cholecalciferol  (VITAMIN D3) 25 MCG (1000 UNIT) tablet Take 1,000 Units by mouth daily.   levothyroxine  (SYNTHROID ) 50 MCG tablet Take 50 mcg by mouth daily before breakfast.   losartan -hydrochlorothiazide  (HYZAAR) 50-12.5 MG tablet Take 0.5 tablets by mouth daily.   Multiple Vitamin (MULTIVITAMIN WITH MINERALS) TABS tablet Take 1 tablet by mouth daily with  breakfast.   MYRBETRIQ 50 MG TB24 tablet Take 50 mg by mouth daily.   pantoprazole  (PROTONIX ) 20 MG tablet Take 20 mg by mouth at bedtime.   PRESCRIPTION MEDICATION BiPAP- At bedtime   Secukinumab (COSENTYX [place]) Inject into the vein every 30 (thirty) days.   sulindac (CLINORIL) 150 MG tablet Take 150 mg by mouth at bedtime.   SYSTANE COMPLETE PF 0.6 % SOLN Place 1 drop into both eyes 4 (four) times daily.   Pimavanserin Tartrate  (NUPLAZID ) 10 MG TABS Take 10 mg by mouth daily. (Patient not taking: Reported on 11/03/2023)   No facility-administered encounter medications on file as of 01/28/2024.  :  Review of Systems:  Out of a complete 14 point review of systems, all are reviewed and negative with the exception of these symptoms as listed below:   Review of Systems  Neurological:        Pt here for Parkinson and cpap f/u Pt states when mask is on with cpap machineit seems like he is suffocating. Pt states chokes and coughs a lot   ESS      Objective:  Neurological Exam  Physical Exam Physical Examination:   Vitals:   01/28/24 1400  BP: 131/69  Pulse: 91    General Examination: The patient is a very pleasant 88 y.o. male in no acute distress. He appears frail and deconditioned, significant sialorrhea noted.   HEENT: Normocephalic, atraumatic, tracking is difficult, neck with moderate to severe rigidity and forward tilt, significant sialorrhea today, moderate hypophonia with mild to  moderate dysarthria. There are no carotid bruits on auscultation.  Tongue protrudes centrally.   Chest: Clear to auscultation without wheezing, rhonchi or crackles noted.   Heart: S1+S2+0, regular and normal without murmurs, rubs or gallops noted.    Abdomen: Soft, non-tender and non-distended.   Extremities: There is trace edema around the ankles.      Skin: Warm and dry without trophic changes noted.   Musculoskeletal: exam reveals lower back stiffness, reduced ROM in neck, restricted mobility in the left shoulder, arthritic changes in both hands. Restricted mobility in the right ankle from a childhood injury, all appear to be stable.   Neurologically:  Mental status: The patient is awake, alert and oriented but history is in large part provided by his wife today.        04/10/2023   12:53 PM  MMSE - Mini Mental State Exam  Orientation to time 5  Orientation to Place 4  Registration 3  Attention/ Calculation 4  Recall 3  Language- name 2 objects 2  Language- repeat 1  Language- follow 3 step command 3  Language- read & follow direction 1  Write a sentence 1  Copy design 0  Total score 27      On 04/10/2023: CDT: 1/4, AFT: 9/min.   Cranial nerves II - XII are as described above under HEENT exam.  Motor exam: no focal atrophy. Strength is globally mildly weak, mild increased tone in the left upper extremity only.   (On 09/16/2018: on Archimedes spiral drawing he has moderate difficulty with the left hand, mild difficulty with right hand. Handwriting with his dominant, right hand is somewhat difficult to read, on the smaller side, not particularly tremulous.)   He has a mild but intermittent resting tremor in the left hand, very slight in the right hand at times.  No lower extremity tremor noted.  Fine motor skills are moderately impaired throughout, worse on the left compared to right.  Cerebellar testing: No dysmetria or intention tremor.  Sensory exam: intact to light  touch.  Gait, station and balance: He stands with difficulty and needs assistance with standing. Posture is moderately stooped with increase in upper back kyphosis noted. He walks with his U step walker and maneuvers at quite well.  However, balance is impaired.   Assessment and Plan:    In summary, ANQUAN AZZARELLO is an 88 year old right-handed gentleman with an underlying complex medical history of peptic ulcer disease with history of upper GI bleed, TIA, depression, degenerative disc disease in the neck, osteoarthritis, kidney stones, hyperlipidemia, hypertension, history of hiatal hernia, BPH, arthritis, and ankylosing spondylitis, and parkinsonism, who presents for follow-up of his parkinsonism, complicated by recurrent falls and sleep disruption from nocturia and OSA with PAP intolerance, cognitive decline, and intermittent hallucinations, as well as sialorrhea.  His DaTscan  from 01/07/2019 was supportive for an underlying parkinsonian syndrome.   His diagnostic sleep study on 04/26/2020 showed moderate obstructive sleep apnea by number of events with an AHI of 18.3/h, but milder desaturations, O2 nadir of 87%. He started autoPap therapy in April 2022.  He is struggling with his AutoPap but willing to continue to try it.   He has had decline in mobility and worsening balance, recurrent falls.   He started using a U step walker about 6 to 8 weeks ago and has done fairly well with it.   We talked about the challenges of advancing Parkinson's disease again today.  He does not wish to consider Botox injections for his sialorrhea at this time but we can revisit in the future.   For his hallucinations, we had discussed the use of Nuplazid  in low-dose but he does not wish to try it at this time.  We can revisit in the future.    He is going to start OT and PT again.  We will continue with his medications and he is advised to be more proactive about constipation issues, take MiraLAX every day and add a  stool softener daily.  If this does not help combat his constipation, he is advised to talk to his PCP about a referral to GI. He is advised to follow-up with Terrilyn Fick, NP in about 6 months, sooner if needed.  I answered all their questions today and the patient and his wife were in agreement. I spent 45 minutes in total face-to-face time and in reviewing records during pre-charting, more than 50% of which was spent in counseling and coordination of care, reviewing test results, reviewing medications and treatment regimen and/or in discussing or reviewing the diagnosis of PD, the prognosis and treatment options. Pertinent laboratory and imaging test results that were available during this visit with the patient were reviewed by me and considered in my medical decision making (see chart for details).

## 2024-01-28 NOTE — Patient Instructions (Signed)
 Please take MiraLAX daily and add a stool softener daily.  If this does not help your constipation, talk to your PCP about seeing a GI specialist.  Continue to use your AutoPap as best as possible.  Use your U step walker at all times.  Try to hydrate well with water.  You can try the spray for excessive salivation as discussed with Dr. Efraim Grange.  We may consider Botox injections down the road.  We will monitor your hallucinations and may consider low-dose Nuplazid , as discussed, in the near future.

## 2024-02-03 ENCOUNTER — Ambulatory Visit: Attending: Neurology | Admitting: Occupational Therapy

## 2024-02-03 ENCOUNTER — Ambulatory Visit

## 2024-02-03 DIAGNOSIS — G20A2 Parkinson's disease without dyskinesia, with fluctuations: Secondary | ICD-10-CM | POA: Insufficient documentation

## 2024-02-03 DIAGNOSIS — R2681 Unsteadiness on feet: Secondary | ICD-10-CM

## 2024-02-03 DIAGNOSIS — R262 Difficulty in walking, not elsewhere classified: Secondary | ICD-10-CM | POA: Diagnosis not present

## 2024-02-03 DIAGNOSIS — Z961 Presence of intraocular lens: Secondary | ICD-10-CM | POA: Diagnosis not present

## 2024-02-03 DIAGNOSIS — R29818 Other symptoms and signs involving the nervous system: Secondary | ICD-10-CM | POA: Insufficient documentation

## 2024-02-03 DIAGNOSIS — R293 Abnormal posture: Secondary | ICD-10-CM

## 2024-02-03 DIAGNOSIS — R2689 Other abnormalities of gait and mobility: Secondary | ICD-10-CM | POA: Insufficient documentation

## 2024-02-03 DIAGNOSIS — M6281 Muscle weakness (generalized): Secondary | ICD-10-CM | POA: Insufficient documentation

## 2024-02-03 DIAGNOSIS — H52203 Unspecified astigmatism, bilateral: Secondary | ICD-10-CM | POA: Diagnosis not present

## 2024-02-03 DIAGNOSIS — R278 Other lack of coordination: Secondary | ICD-10-CM | POA: Insufficient documentation

## 2024-02-03 NOTE — Therapy (Signed)
 OUTPATIENT OCCUPATIONAL THERAPY PARKINSON'S EVALUATION  Patient Name: [name] MRN: [id number] DOB:Apr 10, 1936, 88 y.o., male Today's Date: 02/03/2024  PCP: Bertha Broad, MD REFERRING PROVIDER: Debbra Fairy, MD  END OF SESSION:  OT End of Session - 02/03/24 1549     Visit Number 2    Number of Visits 7    Date for OT Re-Evaluation 03/05/24    Authorization Type Medicare A &B    OT Start Time 1405    OT Stop Time 1445    OT Time Calculation (min) 40 min              Past Medical History:  Diagnosis Date   Ankylosing spondylitis (HCC) dx'd ~ 1974   Arthritis    "back" (08/10/2015)   BENIGN PROSTATIC HYPERTROPHY, WITH OBSTRUCTION 01/15/2010   Bleeding duodenal ulcer    Bleeding esophageal ulcer    Bleeding stomach ulcer    GERD 02/22/2008   History of blood transfusion 08/26/1986   "lost ~ 1/2 of my blood volume from multiple bleeding ulcers"   History of hiatal hernia    HYPERGLYCEMIA 11/18/2007   HYPERLIPIDEMIA 02/22/2008   HYPERTENSION, UNSPECIFIED 11/10/2009   Kidney stones    Osteoarthritis    c-spine   Parkinson's disease (HCC)    PEPTIC ULCER DISEASE 11/17/2007   Situational depression    "son died in MVA 06-19-15"   TIA (transient ischemic attack)    "not that I know of in the past; they are trying to determine if I've had one today (08/10/2015)"   Past Surgical History:  Procedure Laterality Date   CATARACT EXTRACTION W/ INTRAOCULAR LENS  IMPLANT, BILATERAL Bilateral 2013   Patient Active Problem List   Diagnosis Date Noted   Ankylosing spondylitis of cervicothoracic region (HCC) 10/16/2016   TIA (transient ischemic attack) 08/10/2015   Carotid stenosis 08/04/2014   Hyperlipidemia 02/21/2012   BENIGN PROSTATIC HYPERTROPHY, WITH OBSTRUCTION 01/15/2010   Essential hypertension 11/10/2009   GERD 02/22/2008   NEPHROLITHIASIS, HX OF 11/17/2007    ONSET DATE: referral date 12/09/23  REFERRING DIAG: R26.81 (ICD-10-CM) - Unsteadiness on feet  R27.8 (ICD-10-CM) - Other lack of coordination R29.3 (ICD-10-CM) - Abnormal posture G20.A2 (ICD-10-CM) - Parkinson's disease without dyskinesia, with fluctuations  THERAPY DIAG:  Abnormal posture  Other symptoms and signs involving the nervous system  Muscle weakness (generalized)  Other lack of coordination  Rationale for Evaluation and Treatment: Rehabilitation  SUBJECTIVE:   SUBJECTIVE STATEMENT: Pt's spouse reports that he is using the U-step in the house, but "not enough"  Pt accompanied by: self and significant other (spouse, Hazen Liter)  PERTINENT HISTORY: history of Parkinson's disease, duodenal and esophageal ulcers, ankylosing spondylitis, hypertension and hyperlipidemia  PRECAUTIONS: Fall  WEIGHT BEARING RESTRICTIONS: No  PAIN:  Are you having pain? No  FALLS: Has patient fallen in last 3 months? Yes. Number of falls 5-6 fall; however 2-3 falls where he has needed to have assist to get him up or go to ED  LIVING ENVIRONMENT: Lives with: lives with their spouse Lives in: House/apartment Stairs: Yes: External: 2-3 steps; has a ramp at entrance with raised railings Internal: steps to basement and steps to loft - however pt does not need to go up/down those at this time. Has following equipment at home: Otho Blitz - 2 wheeled, Environmental consultant - 4 wheeled, U-step, bed side commode, Shower chair, Ramped entry, and transport chair  PLOF: Requires assistive device for independence and Needs assistance with ADLs  PATIENT GOALS: feel safe to get  in to the shower  OBJECTIVE:  Note: Objective measures were completed at Evaluation unless otherwise noted.  HAND DOMINANCE: Right  ADLs: Overall ADLs: Pt reports there are times that he can be "fairly independent" with dressing but there are times where spouse has to provide significant assistance. Transfers/ambulation related to ADLs: Utilizing U-step for mobility in and outside of the home Eating: "fumble fingered" UB Dressing: occasional  assistance LB Dressing: sock and shoes continue to be difficult Toileting: Pt will occasionally require assistance to get up from toilet Bathing: sponge bathing at sink without assistance from shower chair seated at sink, spouse assisting with washing hair; Setup for washing feet Tub Shower transfers: not currently performing Equipment: Shower seat with back and Walk in shower with 5.5" ledge with 30" door  IADLs: Light housekeeping: washes dishes when he is feeling strong, however frequent onset of weakness Meal Prep: Pt was primary cook prior to fall in 2023, but has returned to participation in aspects of meal prep Community mobility: recently renewed drivers license, but is not driving  MOBILITY STATUS: Needs Assist: CGA with mobility with RW and Hx of falls   POSTURE COMMENTS:  rounded shoulders and forward head, posterior pelvic tilt and R lean  FUNCTIONAL OUTCOME MEASURES: Physical performance test: PPT#2 (simulated eating) 17.41 & PPT#4 (donning/doffing jacket): TBD  COORDINATION: 9 Hole Peg test: Right: 38.44 sec; Left: 61.81 sec  UE ROM:  all movements are slowed, decreased shoulder flexion d/t ankylosing spondylitis   UE MMT:   grossly 4/5 overall  COGNITION: Overall cognitive status: Spouse reports thinking continues to be slower  OBSERVATIONS: Bradykinesia                                                                                                                    TREATMENT DATE:  02/03/24 Box and blocks: Left: 30 blocks (hitting barrier x4) and Right: 36 blocks (hitting barrier x3) Card flipping: engaged in large amplitude movements with cards to provide feedback on amplitude.  Pt flipping cards with LUE with decreased amplitude (inward), OT providing cues to attend to visual pile and ensure reaching to target to facilitate increased amplitude.  Pt flipping cards with RUE with decreased amplitude (downward), OT providing cues to attend to visual pile and ensure  reaching to target to facilitate increased amplitude. OT then adding rolled towel to provide barrier to further facilitate clearance and reach.  OT educating on functional carryover with reaching for items on the dinner table and when placing cups into a cabinet.  OT encouraged pt to attempt folding laundry, in sitting, incorporating reaching outside BOS to upper and lower surfaces to incorporate amplitude.    01/20/24 Educated on DME vs AE to increase ease and independence with bathroom transfers and getting in/out of car.  Pt's spouse reports that she planned to purchase a car door handle/assist but has not yet.  Spouse asking about max height of toilet, as he still has difficulty getting up from [facility] over toilet as well as occasional  spillage due to poor alignment of BSC over toilet seat.  OT discussed toilet rails as well as possibility of positioning RW over toilet seat to allow for hand holds to be hand holds to aid in pushing up from toilet, providing demonstration.  Pt's spouse to look into purchasing max height toilet.     PATIENT EDUCATION: Education details: Educated on role and purpose of OT as well as potential interventions and goals for therapy based on initial evaluation findings. Person educated: Patient and Spouse Education method: Explanation Education comprehension: verbalized understanding and needs further education  HOME EXERCISE PROGRAM: TBD  GOALS: Goals reviewed with patient? Yes  SHORT TERM GOALS: Target date: 02/13/24  Pt and spouse will be independent in full body HEP with focus on large amplitude movements as needed to maintain/preserve function as pertaining to ADLs/IADLs.  Baseline: Goal status: INITIAL  2.  Pt will demonstrate ability to complete sit > stand as needed to get up from toilet or other seating options in home by completing x3 throughout session without cues or physical assistance to maintain/preserve function.  Baseline:  Goal status:  INITIAL  3.  Pt will demonstrate improved functional use of BUE and/or adaptive techniques to aid in increased independence with phone use and tv remove use. Baseline:  Goal status: INITIAL   LONG TERM GOALS: Target date: 03/05/24  Pt and spouse will report understanding of DME vs adaptive techniques/strategies and/or modifications to routines to increase safety with mobility and self-care tasks in the home.  Baseline:  Goal status: INITIAL  2.  Pt will report ability to complete UB/LB dressing at Supervision/setup level with use of AE and/or alternative strategies PRN.  Baseline:  Goal status: INITIAL  3.  Pt will demonstrate improved independence with stand pivot transfers to carry over to shower transfers with supervision. Baseline:  Goal status: INITIAL  4.  Pt will demonstrate improved fine motor coordination for ADLs as evidenced by decreasing 9 hole peg test score for LUE by 10 secs Baseline: Right: 38.44 sec; Left: 61.81 sec Goal status: INITIAL  ASSESSMENT:  CLINICAL IMPRESSION: Pt demonstrating fatigue this session, due to prolonged eye appt earlier in the day.  Pt demonstrating small steps during ambulation to therapy mat and CGA for upright posture with cues for larger steps.  Pt demonstrating decreased amplitude and hitting barrier during box and blocks and card sorting task.  Pt's spouse receptive to education on therapeutic activities and self-care to focus on amplitude with reaching and carryover to stepping as well.   Pt will continue to benefit from skilled occupational therapy services to address strength and coordination, ROM, pain management, balance, GM/FM control, cognition, safety awareness, introduction of compensatory strategies/AE prn, and implementation of an HEP to improve participation and safety during ADLs and IADLs and quality of life.   PERFORMANCE DEFICITS:  in functional skills including ADLs, IADLs, coordination, dexterity, ROM, strength, pain,  flexibility, Fine motor control, Gross motor control, balance, body mechanics, endurance, decreased knowledge of precautions, decreased knowledge of use of DME, and UE functional use and psychosocial skills including coping strategies, environmental adaptation, and routines and behaviors.   IMPAIRMENTS: are limiting patient from ADLs and IADLs.     PLAN:  OT FREQUENCY: 1x/week  OT DURATION: 6 weeks  PLANNED INTERVENTIONS: 97168 OT Re-evaluation, 97535 self care/ADL training, 14782 therapeutic exercise, 97530 therapeutic activity, 97112 neuromuscular re-education, 97140 manual therapy, balance training, functional mobility training, psychosocial skills training, energy conservation, coping strategies training, patient/family education, and DME and/or AE  instructions  RECOMMENDED OTHER SERVICES: NA  CONSULTED AND AGREED WITH PLAN OF CARE: Patient and family member/caregiver  PLAN FOR NEXT SESSION: problem solve bathroom transfers and recommendation for alternative DME, coordination for use of phone and TV remote   [place], Isa Manuel, OTR/L 02/03/2024, 3:50 PM   [facility] at [facility] [address], [address] [place], [place] [address] Phone # [phone] Fax # [phone]

## 2024-02-03 NOTE — Therapy (Signed)
 OUTPATIENT PHYSICAL THERAPY NEURO TREATMENT   Patient Name: [name] MRN: [id number] DOB:1936/05/25, 88 y.o., male Today's Date: 02/03/2024   PCP: Bertha Broad, MD REFERRING PROVIDER: Debbra Fairy, MD  END OF SESSION:  PT End of Session - 02/03/24 1443     Visit Number 2    Number of Visits 9    Date for PT Re-Evaluation 03/16/24    Authorization Type Medicare/[employer]ARP    Progress Note Due on Visit 10    PT Start Time 1445    PT Stop Time 1530    PT Time Calculation (min) 45 min             Past Medical History:  Diagnosis Date   Ankylosing spondylitis (HCC) dx'd ~ 1974   Arthritis    "back" (08/10/2015)   BENIGN PROSTATIC HYPERTROPHY, WITH OBSTRUCTION 01/15/2010   Bleeding duodenal ulcer    Bleeding esophageal ulcer    Bleeding stomach ulcer    GERD 02/22/2008   History of blood transfusion 08/26/1986   "lost ~ 1/2 of my blood volume from multiple bleeding ulcers"   History of hiatal hernia    HYPERGLYCEMIA 11/18/2007   HYPERLIPIDEMIA 02/22/2008   HYPERTENSION, UNSPECIFIED 11/10/2009   Kidney stones    Osteoarthritis    c-spine   Parkinson's disease (HCC)    PEPTIC ULCER DISEASE 11/17/2007   Situational depression    "son died in MVA 07-01-15"   TIA (transient ischemic attack)    "not that I know of in the past; they are trying to determine if I've had one today (08/10/2015)"   Past Surgical History:  Procedure Laterality Date   CATARACT EXTRACTION W/ INTRAOCULAR LENS  IMPLANT, BILATERAL Bilateral 2013   Patient Active Problem List   Diagnosis Date Noted   Ankylosing spondylitis of cervicothoracic region (HCC) 10/16/2016   TIA (transient ischemic attack) 08/10/2015   Carotid stenosis 08/04/2014   Hyperlipidemia 02/21/2012   BENIGN PROSTATIC HYPERTROPHY, WITH OBSTRUCTION 01/15/2010   Essential hypertension 11/10/2009   GERD 02/22/2008   NEPHROLITHIASIS, HX OF 11/17/2007    ONSET DATE: PD x years onset  REFERRING DIAG: R26.81 (ICD-10-CM) -  Unsteadiness on feet R29.3 (ICD-10-CM) - Abnormal posture R27.8 (ICD-10-CM) - Other lack of coordination G20.A2 (ICD-10-CM) - Parkinson's disease with fluctuating manifestations, unspecified whether dyskinesia present (HCC)  THERAPY DIAG:  Unsteadiness on feet  Abnormal posture  Parkinson's disease with fluctuating manifestations, unspecified whether dyskinesia present (HCC)  Other symptoms and signs involving the nervous system  Muscle weakness (generalized)  Other abnormalities of gait and mobility  Difficulty in walking, not elsewhere classified  Rationale for Evaluation and Treatment: Rehabilitation  SUBJECTIVE:  SUBJECTIVE STATEMENT: Doing ok, no new issues.  Pt accompanied by: significant other  PERTINENT HISTORY: complex medical history of peptic ulcer disease with history of upper GI bleed, TIA, depression, degenerative disc disease in the neck, osteoarthritis, kidney stones, hyperlipidemia, hypertension, history of hiatal hernia, BPH, arthritis, and ankylosing spondylitis, and parkinsonism  PAIN:  Are you having pain? No worse for chronic pain, no acute pain  PRECAUTIONS: Fall  RED FLAGS: None   WEIGHT BEARING RESTRICTIONS: No  FALLS: Has patient fallen in last 3 months? Unsure as to number, notes fall/LOB every other week  LIVING ENVIRONMENT: Lives with: lives with their spouse Lives in: House/apartment Stairs: Ramp to enter, stairs to loft and basement, ground floor set-up Has following equipment at home: Otho Blitz - 2 wheeled  PLOF: Needs assistance with ADLs, Needs assistance with homemaking, and Needs assistance with gait  PATIENT GOALS:   OBJECTIVE:   TODAY'S TREATMENT: 02/03/24 Activity Comments  Pt/caregiver education/strategies Freezing of gait strategies, provided  visual cues for clockface turning, walking with metronome, counted steps, etc  Gait training -trials with metronome and counted steps, visual cues,   Multisensory balance On foam             Note: Objective measures were completed at Evaluation unless otherwise noted.  DIAGNOSTIC FINDINGS:   COGNITION: Overall cognitive status: Within functional limits for tasks assessed and some instances of difficulty attending to task/sequencing   SENSATION: Not tested  COORDINATION: Deficits to rapid alternating movements    MUSCLE TONE: rigidity  MUSCLE LENGTH: 10-15 degree knee flexion contracture bilat   POSTURE: rounded shoulders, forward head, and increased thoracic kyphosis  LOWER EXTREMITY ROM:     Right ankle ROM limits from fusion  LOWER EXTREMITY MMT:    4/5 gross BLE  BED MOBILITY:  Findings: NT, reports fluctuating performance  TRANSFERS: Sit to stand: Modified independence and CGA  Assistive device utilized: U-Step     Stand to sit: CGA  Assistive device utilized: u-step     Chair to chair: Modified independence and CGA  Assistive device utilized: U-step         CURB:  Findings: CGA-min A  STAIRS: Findings: Comments: CGA-min A GAIT: Findings: Distance walked: 150, Assistive device utilized:U-step, Level of assistance: CGA, and Comments: freezing in turns  FUNCTIONAL TESTS:  5 times sit to stand: 47.25 sec Timed up and go (TUG): 36 sec w/ U-step 10 meter walk test: 23.34 sec = 1.4 ft/sec w/ U-step Berg Balance Scale:  180 degree turns: 16-18 steps  Item Test date: 01/20/24 Test date:  Test date:   Sitting to standing 2. able to stand using hands after several tries Insert OPRCBERGREEVAL SmartPhrase at re-test date Insert OPRCBERGREEVAL SmartPhrase at re-test date  2. Standing unsupported 3. able to stand 2 minutes with supervision    3. Sitting with back unsupported, feet supported 4. able to sit safely and securely for 2 minutes    4. Standing to  sitting 2. uses back of legs against chair to control descent    5. Pivot transfer  2. able to transfer with verbal cuing and/or supervision    6. Standing unsupported with eyes closed 3. able to stand 10 seconds with supervision    7. Standing unsupported with feet together 3. able to place feet together independently and stand 1 minute with supervision    8. Reaching forward with outstretched arms while standing 3. can reach forward 12 cm (5 inches)    9. Pick up object from  the floor from standing 0. unable to try/needs assistance to keep from losing balance or falling    10. Turning to look behind over left and right shoulders while standing 2. turns sideways but only maintains balance    11. Turn 360 degrees 0. needs assistance while turning    12. Place alternate foot on step or stool while standing unsupported 1. able to complete > 2 steps needs minimal assist    13. Standing unsupported one foot in front 0. loses balance while stepping or standing    14. Standing on one leg 0. unable to try of needs assist to prevent fall      Total Score 25/56 Total Score /56 Total Score /56     PATIENT SURVEYS:  Freezing of gait questionnaire provided to fill out and return                                                                                                                              TREATMENT DATE:     PATIENT EDUCATION:a Education details: assessment details, rationale of tx interventions Person educated: Patient and Spouse Education method: Explanation Education comprehension: verbalized understanding  HOME EXERCISE PROGRAM: TBD  GOALS: Goals reviewed with patient? Yes  SHORT TERM GOALS: Target date: 02/17/2024    Patient will perform HEP with family/caregiver supervision for improved strength, balance, transfers, and gait  Baseline: Goal status: INITIAL  2.  Demo improved postural control and BLE strength per time 35 sec 5xSTS test Baseline: 47 sec Goal status:  INITIAL    LONG TERM GOALS: Target date: 03/16/2024    Demo improved static strength and safety with ADL per score 40/56 Berg Balance Test Baseline: 25/56 Goal status: INITIAL  2.  Demo improved safety/efficiency of turning negotiating 180 degree turn in 10-12 steps Baseline: 16-18 w/ U-step Goal status: INITIAL  3.  Demo improved safety with mobility per time 25 sec TUG test Baseline: 36 sec w/ U-step Goal status: INITIAL  4.  Spouse to teach-back strategies to help with reducing freezing of gait Baseline:  Goal status: INITIAL   ASSESSMENT:  CLINICAL IMPRESSION: Education/demo/practice of strategies for freezing of gait to sequence step height and reduced festinating. Good tolerance and carryover to gait with metronome or counted steps. Difficulty with postural stability under multisensory demands, better with anticipatory reactions. Continued sessions to progress POC details  OBJECTIVE IMPAIRMENTS: Abnormal gait, decreased activity tolerance, decreased balance, decreased coordination, decreased mobility, difficulty walking, decreased strength, improper body mechanics, and postural dysfunction.   ACTIVITY LIMITATIONS: carrying, lifting, bending, standing, squatting, stairs, transfers, bed mobility, reach over head, and locomotion level  PARTICIPATION LIMITATIONS: meal prep, cleaning, shopping, community activity, and ADL  PERSONAL FACTORS: Age, Time since onset of injury/illness/exacerbation, and 3+ comorbidities: PMH are also affecting patient's functional outcome.   REHAB POTENTIAL: Good  CLINICAL DECISION MAKING: Unstable/unpredictable  EVALUATION COMPLEXITY: High  PLAN:  PT FREQUENCY: 1x/week  PT DURATION: 8 weeks  PLANNED INTERVENTIONS:  54098- Physical Performance Testing, 97110-Therapeutic exercises, 97530- Therapeutic activity, [id number]- Neuromuscular re-education, 97535- Self Care, 11914- Manual therapy, [id number]- Gait training, and [phone]- Aquatic Therapy  PLAN  FOR NEXT SESSION: freezing of gait questionnaire, clock-face turns for FoG? (Trying to come up with activities that spouse can practice for turns/freezing of gait)   2:44 PM, 02/03/24 M. Kelly Tykeisha Peer, PT, DPT Physical Therapist- [place] Office Number: [phone]

## 2024-02-09 NOTE — Therapy (Incomplete)
 OUTPATIENT PHYSICAL THERAPY NEURO TREATMENT   Patient Name: [name] MRN: [id number] DOB:12-24-1935, 88 y.o., male Today's Date: 02/09/2024   PCP: Bertha Broad, MD REFERRING PROVIDER: Debbra Fairy, MD  END OF SESSION:    Past Medical History:  Diagnosis Date   Ankylosing spondylitis (HCC) dx'd ~ 1974   Arthritis    back (08/10/2015)   BENIGN PROSTATIC HYPERTROPHY, WITH OBSTRUCTION 01/15/2010   Bleeding duodenal ulcer    Bleeding esophageal ulcer    Bleeding stomach ulcer    GERD 02/22/2008   History of blood transfusion 08/26/1986   lost ~ 1/2 of my blood volume from multiple bleeding ulcers   History of hiatal hernia    HYPERGLYCEMIA 11/18/2007   HYPERLIPIDEMIA 02/22/2008   HYPERTENSION, UNSPECIFIED 11/10/2009   Kidney stones    Osteoarthritis    c-spine   Parkinson's disease (HCC)    PEPTIC ULCER DISEASE 11/17/2007   Situational depression    son died in MVA Jul 16, 2015   TIA (transient ischemic attack)    not that I know of in the past; they are trying to determine if I've had one today (08/10/2015)   Past Surgical History:  Procedure Laterality Date   CATARACT EXTRACTION W/ INTRAOCULAR LENS  IMPLANT, BILATERAL Bilateral 2013   Patient Active Problem List   Diagnosis Date Noted   Ankylosing spondylitis of cervicothoracic region (HCC) 10/16/2016   TIA (transient ischemic attack) 08/10/2015   Carotid stenosis 08/04/2014   Hyperlipidemia 02/21/2012   BENIGN PROSTATIC HYPERTROPHY, WITH OBSTRUCTION 01/15/2010   Essential hypertension 11/10/2009   GERD 02/22/2008   NEPHROLITHIASIS, HX OF 11/17/2007    ONSET DATE: PD x years onset  REFERRING DIAG: R26.81 (ICD-10-CM) - Unsteadiness on feet R29.3 (ICD-10-CM) - Abnormal posture R27.8 (ICD-10-CM) - Other lack of coordination G20.A2 (ICD-10-CM) - Parkinson's disease with fluctuating manifestations, unspecified whether dyskinesia present (HCC)  THERAPY DIAG:  No diagnosis found.  Rationale for  Evaluation and Treatment: Rehabilitation  SUBJECTIVE:                                                                                                                                                                                             SUBJECTIVE STATEMENT: Doing ok, no new issues.  Pt accompanied by: significant other  PERTINENT HISTORY: complex medical history of peptic ulcer disease with history of upper GI bleed, TIA, depression, degenerative disc disease in the neck, osteoarthritis, kidney stones, hyperlipidemia, hypertension, history of hiatal hernia, BPH, arthritis, and ankylosing spondylitis, and parkinsonism  PAIN:  Are you having pain? No worse for chronic pain, no acute pain  PRECAUTIONS: Fall  RED FLAGS: None  WEIGHT BEARING RESTRICTIONS: No  FALLS: Has patient fallen in last 3 months? Unsure as to number, notes fall/LOB every other week  LIVING ENVIRONMENT: Lives with: lives with their spouse Lives in: House/apartment Stairs: Ramp to enter, stairs to loft and basement, ground floor set-up Has following equipment at home: Otho Blitz - 2 wheeled  PLOF: Needs assistance with ADLs, Needs assistance with homemaking, and Needs assistance with gait  PATIENT GOALS:   OBJECTIVE:     TODAY'S TREATMENT: 02/10/24 Activity Comments                         TODAY'S TREATMENT: 02/03/24 Activity Comments  Pt/caregiver education/strategies Freezing of gait strategies, provided visual cues for clockface turning, walking with metronome, counted steps, etc  Gait training -trials with metronome and counted steps, visual cues,   Multisensory balance On foam             Note: Objective measures were completed at Evaluation unless otherwise noted.  DIAGNOSTIC FINDINGS:   COGNITION: Overall cognitive status: Within functional limits for tasks assessed and some instances of difficulty attending to task/sequencing   SENSATION: Not tested  COORDINATION: Deficits  to rapid alternating movements    MUSCLE TONE: rigidity  MUSCLE LENGTH: 10-15 degree knee flexion contracture bilat   POSTURE: rounded shoulders, forward head, and increased thoracic kyphosis  LOWER EXTREMITY ROM:     Right ankle ROM limits from fusion  LOWER EXTREMITY MMT:    4/5 gross BLE  BED MOBILITY:  Findings: NT, reports fluctuating performance  TRANSFERS: Sit to stand: Modified independence and CGA  Assistive device utilized: U-Step     Stand to sit: CGA  Assistive device utilized: u-step     Chair to chair: Modified independence and CGA  Assistive device utilized: U-step         CURB:  Findings: CGA-min A  STAIRS: Findings: Comments: CGA-min A GAIT: Findings: Distance walked: 150, Assistive device utilized:U-step, Level of assistance: CGA, and Comments: freezing in turns  FUNCTIONAL TESTS:  5 times sit to stand: 47.25 sec Timed up and go (TUG): 36 sec w/ U-step 10 meter walk test: 23.34 sec = 1.4 ft/sec w/ U-step Berg Balance Scale:  180 degree turns: 16-18 steps  Item Test date: 01/20/24 Test date:  Test date:   Sitting to standing 2. able to stand using hands after several tries Insert OPRCBERGREEVAL SmartPhrase at re-test date Insert OPRCBERGREEVAL SmartPhrase at re-test date  2. Standing unsupported 3. able to stand 2 minutes with supervision    3. Sitting with back unsupported, feet supported 4. able to sit safely and securely for 2 minutes    4. Standing to sitting 2. uses back of legs against chair to control descent    5. Pivot transfer  2. able to transfer with verbal cuing and/or supervision    6. Standing unsupported with eyes closed 3. able to stand 10 seconds with supervision    7. Standing unsupported with feet together 3. able to place feet together independently and stand 1 minute with supervision    8. Reaching forward with outstretched arms while standing 3. can reach forward 12 cm (5 inches)    9. Pick up object from the floor from  standing 0. unable to try/needs assistance to keep from losing balance or falling    10. Turning to look behind over left and right shoulders while standing 2. turns sideways but only maintains balance    11. Turn 360 degrees 0.  needs assistance while turning    12. Place alternate foot on step or stool while standing unsupported 1. able to complete > 2 steps needs minimal assist    13. Standing unsupported one foot in front 0. loses balance while stepping or standing    14. Standing on one leg 0. unable to try of needs assist to prevent fall      Total Score 25/56 Total Score /56 Total Score /56     PATIENT SURVEYS:  Freezing of gait questionnaire provided to fill out and return                                                                                                                              TREATMENT DATE:     PATIENT EDUCATION:a Education details: assessment details, rationale of tx interventions Person educated: Patient and Spouse Education method: Explanation Education comprehension: verbalized understanding  HOME EXERCISE PROGRAM: TBD  GOALS: Goals reviewed with patient? Yes  SHORT TERM GOALS: Target date: 02/17/2024    Patient will perform HEP with family/caregiver supervision for improved strength, balance, transfers, and gait  Baseline: Goal status: IN PROGRESS  2.  Demo improved postural control and BLE strength per time 35 sec 5xSTS test Baseline: 47 sec Goal status: IN PROGRESS    LONG TERM GOALS: Target date: 03/16/2024    Demo improved static strength and safety with ADL per score 40/56 Berg Balance Test Baseline: 25/56 Goal status: IN PROGRESS  2.  Demo improved safety/efficiency of turning negotiating 180 degree turn in 10-12 steps Baseline: 16-18 w/ U-step Goal status: IN PROGRESS  3.  Demo improved safety with mobility per time 25 sec TUG test Baseline: 36 sec w/ U-step Goal status: IN PROGRESS  4.  Spouse to teach-back strategies to  help with reducing freezing of gait Baseline:  Goal status: IN PROGRESS   ASSESSMENT:  CLINICAL IMPRESSION: Education/demo/practice of strategies for freezing of gait to sequence step height and reduced festinating. Good tolerance and carryover to gait with metronome or counted steps. Difficulty with postural stability under multisensory demands, better with anticipatory reactions. Continued sessions to progress POC details  OBJECTIVE IMPAIRMENTS: Abnormal gait, decreased activity tolerance, decreased balance, decreased coordination, decreased mobility, difficulty walking, decreased strength, improper body mechanics, and postural dysfunction.   ACTIVITY LIMITATIONS: carrying, lifting, bending, standing, squatting, stairs, transfers, bed mobility, reach over head, and locomotion level  PARTICIPATION LIMITATIONS: meal prep, cleaning, shopping, community activity, and ADL  PERSONAL FACTORS: Age, Time since onset of injury/illness/exacerbation, and 3+ comorbidities: PMH are also affecting patient's functional outcome.   REHAB POTENTIAL: Good  CLINICAL DECISION MAKING: Unstable/unpredictable  EVALUATION COMPLEXITY: High  PLAN:  PT FREQUENCY: 1x/week  PT DURATION: 8 weeks  PLANNED INTERVENTIONS: 97750- Physical Performance Testing, 97110-Therapeutic exercises, 97530- Therapeutic activity, [id number]- Neuromuscular re-education, 97535- Self Care, 16109- Manual therapy, [id number]- Gait training, and [phone]- Aquatic Therapy  PLAN FOR NEXT SESSION: freezing of gait questionnaire, clock-face turns for FoG? (Trying to  come up with activities that spouse can practice for turns/freezing of gait)

## 2024-02-10 ENCOUNTER — Ambulatory Visit: Admitting: Physical Therapy

## 2024-02-10 ENCOUNTER — Ambulatory Visit: Admitting: Occupational Therapy

## 2024-02-17 ENCOUNTER — Ambulatory Visit: Admitting: Occupational Therapy

## 2024-02-17 ENCOUNTER — Ambulatory Visit

## 2024-02-17 DIAGNOSIS — M6281 Muscle weakness (generalized): Secondary | ICD-10-CM

## 2024-02-17 DIAGNOSIS — R278 Other lack of coordination: Secondary | ICD-10-CM

## 2024-02-17 DIAGNOSIS — G20A2 Parkinson's disease without dyskinesia, with fluctuations: Secondary | ICD-10-CM | POA: Diagnosis not present

## 2024-02-17 DIAGNOSIS — R262 Difficulty in walking, not elsewhere classified: Secondary | ICD-10-CM

## 2024-02-17 DIAGNOSIS — R29818 Other symptoms and signs involving the nervous system: Secondary | ICD-10-CM | POA: Diagnosis not present

## 2024-02-17 DIAGNOSIS — R293 Abnormal posture: Secondary | ICD-10-CM

## 2024-02-17 DIAGNOSIS — R2689 Other abnormalities of gait and mobility: Secondary | ICD-10-CM

## 2024-02-17 DIAGNOSIS — R2681 Unsteadiness on feet: Secondary | ICD-10-CM

## 2024-02-17 NOTE — Therapy (Signed)
 OUTPATIENT OCCUPATIONAL THERAPY PARKINSON'S  Treatment Note  Patient Name: [name] MRN: [id number] DOB:1935/09/01, 88 y.o., male Today's Date: 02/17/2024  PCP: Casey Toribio MATSU, MD REFERRING PROVIDER: Buck Saucer, MD  END OF SESSION:  OT End of Session - 02/17/24 1430     Visit Number 3    Number of Visits 7    Date for OT Re-Evaluation 03/05/24    Authorization Type Medicare A &B    OT Start Time 1404    OT Stop Time 1444    OT Time Calculation (min) 40 min            Past Medical History:  Diagnosis Date   Ankylosing spondylitis (HCC) dx'd ~ 1974   Arthritis    back (08/10/2015)   BENIGN PROSTATIC HYPERTROPHY, WITH OBSTRUCTION 01/15/2010   Bleeding duodenal ulcer    Bleeding esophageal ulcer    Bleeding stomach ulcer    GERD 02/22/2008   History of blood transfusion 08/26/1986   lost ~ 1/2 of my blood volume from multiple bleeding ulcers   History of hiatal hernia    HYPERGLYCEMIA 11/18/2007   HYPERLIPIDEMIA 02/22/2008   HYPERTENSION, UNSPECIFIED 11/10/2009   Kidney stones    Osteoarthritis    c-spine   Parkinson's disease (HCC)    PEPTIC ULCER DISEASE 11/17/2007   Situational depression    son died in MVA Jul 16, 2015   TIA (transient ischemic attack)    not that I know of in the past; they are trying to determine if I've had one today (08/10/2015)   Past Surgical History:  Procedure Laterality Date   CATARACT EXTRACTION W/ INTRAOCULAR LENS  IMPLANT, BILATERAL Bilateral 2013   Patient Active Problem List   Diagnosis Date Noted   Ankylosing spondylitis of cervicothoracic region (HCC) 10/16/2016   TIA (transient ischemic attack) 08/10/2015   Carotid stenosis 08/04/2014   Hyperlipidemia 02/21/2012   BENIGN PROSTATIC HYPERTROPHY, WITH OBSTRUCTION 01/15/2010   Essential hypertension 11/10/2009   GERD 02/22/2008   NEPHROLITHIASIS, HX OF 11/17/2007    ONSET DATE: referral date 12/09/23  REFERRING DIAG: R26.81 (ICD-10-CM) - Unsteadiness on  feet R27.8 (ICD-10-CM) - Other lack of coordination R29.3 (ICD-10-CM) - Abnormal posture G20.A2 (ICD-10-CM) - Parkinson's disease without dyskinesia, with fluctuations  THERAPY DIAG:  Other symptoms and signs involving the nervous system  Muscle weakness (generalized)  Other lack of coordination  Rationale for Evaluation and Treatment: Rehabilitation  SUBJECTIVE:   SUBJECTIVE STATEMENT: Pt reports having a few days last week wearing he was feeling nauseous and not top of the mark. Pt reports having a couple falls last night, reporting falling backwards due to Parkinsons when heading to or from the bathroom and when attempting to don slip on shoes.   Pt accompanied by: self (friend Victory)  PERTINENT HISTORY: history of Parkinson's disease, duodenal and esophageal ulcers, ankylosing spondylitis, hypertension and hyperlipidemia  PRECAUTIONS: Fall  WEIGHT BEARING RESTRICTIONS: No  PAIN:  Are you having pain? No  FALLS: Has patient fallen in last 3 months? Yes. Number of falls 5-6 fall; however 2-3 falls where he has needed to have assist to get him up or go to ED  LIVING ENVIRONMENT: Lives with: lives with their spouse Lives in: House/apartment Stairs: Yes: External: 2-3 steps; has a ramp at entrance with raised railings Internal: steps to basement and steps to loft - however pt does not need to go up/down those at this time. Has following equipment at home: [name] - 2 wheeled, Walker - 4 wheeled, U-step, bed  side commode, Shower chair, Ramped entry, and transport chair  PLOF: Requires assistive device for independence and Needs assistance with ADLs  PATIENT GOALS: feel safe to get in to the shower  OBJECTIVE:  Note: Objective measures were completed at Evaluation unless otherwise noted.  HAND DOMINANCE: Right  ADLs: Overall ADLs: Pt reports there are times that he can be fairly independent with dressing but there are times where spouse has to provide significant  assistance. Transfers/ambulation related to ADLs: Utilizing U-step for mobility in and outside of the home Eating: fumble fingered UB Dressing: occasional assistance LB Dressing: sock and shoes continue to be difficult Toileting: Pt will occasionally require assistance to get up from toilet Bathing: sponge bathing at sink without assistance from shower chair seated at sink, spouse assisting with washing hair; Setup for washing feet Tub Shower transfers: not currently performing Equipment: Shower seat with back and Walk in shower with 5.5 ledge with 30 door  IADLs: Light housekeeping: washes dishes when he is feeling strong, however frequent onset of weakness Meal Prep: Pt was primary cook prior to fall in 2023, but has returned to participation in aspects of meal prep Community mobility: recently renewed drivers license, but is not driving  MOBILITY STATUS: Needs Assist: CGA with mobility with RW and Hx of falls   POSTURE COMMENTS:  rounded shoulders and forward head, posterior pelvic tilt and R lean  FUNCTIONAL OUTCOME MEASURES: Physical performance test: PPT#2 (simulated eating) 17.41 & PPT#4 (donning/doffing jacket): TBD  COORDINATION: 9 Hole Peg test: Right: 38.44 sec; Left: 61.81 sec  UE ROM:  all movements are slowed, decreased shoulder flexion d/t ankylosing spondylitis   UE MMT:   grossly 4/5 overall  COGNITION: Overall cognitive status: Spouse reports thinking continues to be slower  OBSERVATIONS: Bradykinesia                                                                                                                    TREATMENT DATE:  02/17/24 Self-care: pt reports having his doors widened in the home which has made turns easier and maneuvering through room.  Pt reports using U-step in the home about half the time but that there are still some passages in the home that are not navigable with the U-step.   Functional mobility: OT providing cues for large  amplitude with sit > stand and ambulation with focus on bigger steps to increase upright mobility and safety with sit > stand and turns when approaching and backing up to seat - as pt with frequent falls coming to or from toilet. Coordination: with RUE, LUE, picking up various small coins and placing them into coin slot, picking up coins and stacking/unstacking them, and picking up stones and pushing stones into putty to simulate pushing buttons on phone and TV remote.  OT providing demonstration and verbal cues to incorporate finger flicks and large amplitude hand opening prior to picking up small items to facilitate increased amplitude and motor control.  Pt with improved ability to pick up  coins vs slide across table immediately after finger flicks, however decreasing with repetition.      02/03/24 Box and blocks: Left: 30 blocks (hitting barrier x4) and Right: 36 blocks (hitting barrier x3) Card flipping: engaged in large amplitude movements with cards to provide feedback on amplitude.  Pt flipping cards with LUE with decreased amplitude (inward), OT providing cues to attend to visual [name] and ensure reaching to target to facilitate increased amplitude.  Pt flipping cards with RUE with decreased amplitude (downward), OT providing cues to attend to visual [name] and ensure reaching to target to facilitate increased amplitude. OT then adding rolled towel to provide barrier to further facilitate clearance and reach.  OT educating on functional carryover with reaching for items on the dinner table and when placing cups into a cabinet.  OT encouraged pt to attempt folding laundry, in sitting, incorporating reaching outside BOS to upper and lower surfaces to incorporate amplitude.    01/20/24 Educated on DME vs AE to increase ease and independence with bathroom transfers and getting in/out of car.  Pt's spouse reports that she planned to purchase a car door handle/assist but has not yet.  Spouse asking about  max height of toilet, as he still has difficulty getting up from [facility] over toilet as well as occasional spillage due to poor alignment of BSC over toilet seat.  OT discussed toilet rails as well as possibility of positioning RW over toilet seat to allow for hand holds to be hand holds to aid in pushing up from toilet, providing demonstration.  Pt's spouse to look into purchasing max height toilet.     PATIENT EDUCATION: Education details: Educated on role and purpose of OT as well as potential interventions and goals for therapy based on initial evaluation findings. Person educated: Patient and Spouse Education method: Explanation Education comprehension: verbalized understanding and needs further education  HOME EXERCISE PROGRAM: TBD  GOALS: Goals reviewed with patient? Yes  SHORT TERM GOALS: Target date: 02/13/24  Pt and spouse will be independent in full body HEP with focus on large amplitude movements as needed to maintain/preserve function as pertaining to ADLs/IADLs.  Baseline: Goal status: INITIAL  2.  Pt will demonstrate ability to complete sit > stand as needed to get up from toilet or other seating options in home by completing x3 throughout session without cues or physical assistance to maintain/preserve function.  Baseline:  Goal status: INITIAL  3.  Pt will demonstrate improved functional use of BUE and/or adaptive techniques to aid in increased independence with phone use and tv remove use. Baseline:  Goal status: INITIAL   LONG TERM GOALS: Target date: 03/05/24  Pt and spouse will report understanding of DME vs adaptive techniques/strategies and/or modifications to routines to increase safety with mobility and self-care tasks in the home.  Baseline:  Goal status: INITIAL  2.  Pt will report ability to complete UB/LB dressing at Supervision/setup level with use of AE and/or alternative strategies PRN.  Baseline:  Goal status: INITIAL  3.  Pt will demonstrate  improved independence with stand pivot transfers to carry over to shower transfers with supervision. Baseline:  Goal status: INITIAL  4.  Pt will demonstrate improved fine motor coordination for ADLs as evidenced by decreasing 9 hole peg test score for LUE by 10 secs Baseline: Right: 38.44 sec; Left: 61.81 sec Goal status: INITIAL  ASSESSMENT:  CLINICAL IMPRESSION: Pt initially with difficulty with sit > stand and reporting instability, however vitals WNL.  Pt benefiting from  additional sit > stand with improved ease and amplitude followed by improved mobility with ambulation with mod cues for big steps.  Pt demonstrating improved coordination and ability to pick up coins and stones from table top and resistance after use of large amplitude, finger flicks.  Pt will continue to benefit from skilled occupational therapy services to address strength and coordination, ROM, pain management, balance, GM/FM control, cognition, safety awareness, introduction of compensatory strategies/AE prn, and implementation of an HEP to improve participation and safety during ADLs and IADLs and quality of life.   PERFORMANCE DEFICITS:  in functional skills including ADLs, IADLs, coordination, dexterity, ROM, strength, pain, flexibility, Fine motor control, Gross motor control, balance, body mechanics, endurance, decreased knowledge of precautions, decreased knowledge of use of DME, and UE functional use and psychosocial skills including coping strategies, environmental adaptation, and routines and behaviors.   IMPAIRMENTS: are limiting patient from ADLs and IADLs.     PLAN:  OT FREQUENCY: 1x/week  OT DURATION: 6 weeks  PLANNED INTERVENTIONS: 97168 OT Re-evaluation, 97535 self care/ADL training, 02889 therapeutic exercise, 97530 therapeutic activity, 97112 neuromuscular re-education, 97140 manual therapy, balance training, functional mobility training, psychosocial skills training, energy conservation, coping  strategies training, patient/family education, and DME and/or AE instructions  RECOMMENDED OTHER SERVICES: NA  CONSULTED AND AGREED WITH PLAN OF CARE: Patient and family member/caregiver  PLAN FOR NEXT SESSION: problem solve bathroom transfers and recommendation for alternative DME, coordination for use of phone and TV remote   Upton, LAURAINE, OTR/L 02/17/2024, 2:30 PM   [facility] at [facility] [address] [place], [place] [address] Phone # [phone] Fax # [phone]

## 2024-02-17 NOTE — Therapy (Signed)
 OUTPATIENT PHYSICAL THERAPY NEURO TREATMENT   Patient Name: [name] MRN: [id number] DOB:10-19-1935, 88 y.o., male Today's Date: 02/17/2024   PCP: Yolande Toribio MATSU, MD REFERRING PROVIDER: Buck Saucer, MD  END OF SESSION:  PT End of Session - 02/17/24 1452     Visit Number 3    Number of Visits 9    Date for PT Re-Evaluation 03/16/24    Authorization Type Medicare/[employer]    Progress Note Due on Visit 10    PT Start Time 1445    PT Stop Time 1530    PT Time Calculation (min) 45 min          Past Medical History:  Diagnosis Date   Ankylosing spondylitis (HCC) dx'd ~ 1974   Arthritis    back (08/10/2015)   BENIGN PROSTATIC HYPERTROPHY, WITH OBSTRUCTION 01/15/2010   Bleeding duodenal ulcer    Bleeding esophageal ulcer    Bleeding stomach ulcer    GERD 02/22/2008   History of blood transfusion 08/26/1986   lost ~ 1/2 of my blood volume from multiple bleeding ulcers   History of hiatal hernia    HYPERGLYCEMIA 11/18/2007   HYPERLIPIDEMIA 02/22/2008   HYPERTENSION, UNSPECIFIED 11/10/2009   Kidney stones    Osteoarthritis    c-spine   Parkinson's disease (HCC)    PEPTIC ULCER DISEASE 11/17/2007   Situational depression    son died in MVA 2015/07/15   TIA (transient ischemic attack)    not that I know of in the past; they are trying to determine if I've had one today (08/10/2015)   Past Surgical History:  Procedure Laterality Date   CATARACT EXTRACTION W/ INTRAOCULAR LENS  IMPLANT, BILATERAL Bilateral 2013   Patient Active Problem List   Diagnosis Date Noted   Ankylosing spondylitis of cervicothoracic region (HCC) 10/16/2016   TIA (transient ischemic attack) 08/10/2015   Carotid stenosis 08/04/2014   Hyperlipidemia 02/21/2012   BENIGN PROSTATIC HYPERTROPHY, WITH OBSTRUCTION 01/15/2010   Essential hypertension 11/10/2009   GERD 02/22/2008   NEPHROLITHIASIS, HX OF 11/17/2007    ONSET DATE: PD x years onset  REFERRING DIAG: R26.81 (ICD-10-CM) -  Unsteadiness on feet R29.3 (ICD-10-CM) - Abnormal posture R27.8 (ICD-10-CM) - Other lack of coordination G20.A2 (ICD-10-CM) - Parkinson's disease with fluctuating manifestations, unspecified whether dyskinesia present (HCC)  THERAPY DIAG:  Unsteadiness on feet  Abnormal posture  Parkinson's disease with fluctuating manifestations, unspecified whether dyskinesia present (HCC)  Muscle weakness (generalized)  Other abnormalities of gait and mobility  Difficulty in walking, not elsewhere classified  Rationale for Evaluation and Treatment: Rehabilitation  SUBJECTIVE:  SUBJECTIVE STATEMENT: Have been using the U-step almost exclusively for walking indoors and out.    Pt accompanied by: significant other  PERTINENT HISTORY: complex medical history of peptic ulcer disease with history of upper GI bleed, TIA, depression, degenerative disc disease in the neck, osteoarthritis, kidney stones, hyperlipidemia, hypertension, history of hiatal hernia, BPH, arthritis, and ankylosing spondylitis, and parkinsonism  PAIN:  Are you having pain? No worse for chronic pain, no acute pain  PRECAUTIONS: Fall  RED FLAGS: None   WEIGHT BEARING RESTRICTIONS: No  FALLS: Has patient fallen in last 3 months? Unsure as to number, notes fall/LOB every other week  LIVING ENVIRONMENT: Lives with: lives with their spouse Lives in: House/apartment Stairs: Ramp to enter, stairs to loft and basement, ground floor set-up Has following equipment at home: [name] - 2 wheeled  PLOF: Needs assistance with ADLs, Needs assistance with homemaking, and Needs assistance with gait  PATIENT GOALS:   OBJECTIVE:   TODAY'S TREATMENT: 02/17/24 Activity Comments  NU-step level 4 x 8 min Cues for 70+ SPM  Lateral step over x 2 min Half roll  bolster BUE support  Forwards/backwards x 2 min Half roll bolster BUE support  Gait training -strategies for turning to address FoG -strategies for stride length and step height -sit to stand step over 6 hurdles  5xSTS test 37 sec w/ UE support  Static standing EO/EC On firm, 7-9 sec before LOB w/ eyes closed     TODAY'S TREATMENT: 02/03/24 Activity Comments  Pt/caregiver education/strategies Freezing of gait strategies, provided visual cues for clockface turning, walking with metronome, counted steps, etc  Gait training -trials with metronome and counted steps, visual cues,   Multisensory balance On foam             Note: Objective measures were completed at Evaluation unless otherwise noted.  DIAGNOSTIC FINDINGS:   COGNITION: Overall cognitive status: Within functional limits for tasks assessed and some instances of difficulty attending to task/sequencing   SENSATION: Not tested  COORDINATION: Deficits to rapid alternating movements    MUSCLE TONE: rigidity  MUSCLE LENGTH: 10-15 degree knee flexion contracture bilat   POSTURE: rounded shoulders, forward head, and increased thoracic kyphosis  LOWER EXTREMITY ROM:     Right ankle ROM limits from fusion  LOWER EXTREMITY MMT:    4/5 gross BLE  BED MOBILITY:  Findings: NT, reports fluctuating performance  TRANSFERS: Sit to stand: Modified independence and CGA  Assistive device utilized: U-Step     Stand to sit: CGA  Assistive device utilized: u-step     Chair to chair: Modified independence and CGA  Assistive device utilized: U-step         CURB:  Findings: CGA-min A  STAIRS: Findings: Comments: CGA-min A GAIT: Findings: Distance walked: 150, Assistive device utilized:U-step, Level of assistance: CGA, and Comments: freezing in turns  FUNCTIONAL TESTS:  5 times sit to stand: 47.25 sec Timed up and go (TUG): 36 sec w/ U-step 10 meter walk test: 23.34 sec = 1.4 ft/sec w/ U-step Berg Balance Scale:   180 degree turns: 16-18 steps  Item Test date: 01/20/24 Test date:  Test date:   Sitting to standing 2. able to stand using hands after several tries Insert OPRCBERGREEVAL SmartPhrase at re-test date Insert OPRCBERGREEVAL SmartPhrase at re-test date  2. Standing unsupported 3. able to stand 2 minutes with supervision    3. Sitting with back unsupported, feet supported 4. able to sit safely and securely for 2 minutes    4.  Standing to sitting 2. uses back of legs against chair to control descent    5. Pivot transfer  2. able to transfer with verbal cuing and/or supervision    6. Standing unsupported with eyes closed 3. able to stand 10 seconds with supervision    7. Standing unsupported with feet together 3. able to place feet together independently and stand 1 minute with supervision    8. Reaching forward with outstretched arms while standing 3. can reach forward 12 cm (5 inches)    9. Pick up object from the floor from standing 0. unable to try/needs assistance to keep from losing balance or falling    10. Turning to look behind over left and right shoulders while standing 2. turns sideways but only maintains balance    11. Turn 360 degrees 0. needs assistance while turning    12. Place alternate foot on step or stool while standing unsupported 1. able to complete > 2 steps needs minimal assist    13. Standing unsupported one foot in front 0. loses balance while stepping or standing    14. Standing on one leg 0. unable to try of needs assist to prevent fall      Total Score 25/56 Total Score /56 Total Score /56     PATIENT SURVEYS:  Freezing of gait questionnaire provided to fill out and return                                                                                                                              TREATMENT DATE:     PATIENT EDUCATION:a Education details: assessment details, rationale of tx interventions Person educated: Patient and Spouse Education method:  Explanation Education comprehension: verbalized understanding  HOME EXERCISE PROGRAM: TBD  GOALS: Goals reviewed with patient? Yes  SHORT TERM GOALS: Target date: 02/17/2024    Patient will perform HEP with family/caregiver supervision for improved strength, balance, transfers, and gait  Baseline: Goal status: MET  2.  Demo improved postural control and BLE strength per time 35 sec 5xSTS test Baseline: 47 sec; 37 sec Goal status: NOT MET    LONG TERM GOALS: Target date: 03/16/2024    Demo improved static strength and safety with ADL per score 40/56 Berg Balance Test Baseline: 25/56 Goal status: INITIAL  2.  Demo improved safety/efficiency of turning negotiating 180 degree turn in 10-12 steps Baseline: 16-18 w/ U-step Goal status: INITIAL  3.  Demo improved safety with mobility per time 25 sec TUG test Baseline: 36 sec w/ U-step Goal status: INITIAL  4.  Spouse to teach-back strategies to help with reducing freezing of gait Baseline:  Goal status: INITIAL   ASSESSMENT:  CLINICAL IMPRESSION: NU-step for neuro re-ed to facilitate rapid alternating movement at higher cadence to prepare for ambulation. Activities to improve step height and length with visual target for stepping over obstacle.  Gait training strategies to improve turning 180 degrees with good carryover to clock face  imagery to sequence turning. With cues for step height/length/speed he starts off well but after 10-15 ft reverting to smaller amplitude/instances of festinating. Improved carryover w/ visual cues vs verbal. Improved performance 5xSTS with less retro-pulsion but need for UE support for arising and lowering. Continued sessions to advance POC details to improve safety with mobility and reduce risk for falls  OBJECTIVE IMPAIRMENTS: Abnormal gait, decreased activity tolerance, decreased balance, decreased coordination, decreased mobility, difficulty walking, decreased strength, improper body  mechanics, and postural dysfunction.   ACTIVITY LIMITATIONS: carrying, lifting, bending, standing, squatting, stairs, transfers, bed mobility, reach over head, and locomotion level  PARTICIPATION LIMITATIONS: meal prep, cleaning, shopping, community activity, and ADL  PERSONAL FACTORS: Age, Time since onset of injury/illness/exacerbation, and 3+ comorbidities: PMH are also affecting patient's functional outcome.   REHAB POTENTIAL: Good  CLINICAL DECISION MAKING: Unstable/unpredictable  EVALUATION COMPLEXITY: High  PLAN:  PT FREQUENCY: 1x/week  PT DURATION: 8 weeks  PLANNED INTERVENTIONS: 97750- Physical Performance Testing, 97110-Therapeutic exercises, 97530- Therapeutic activity, [id number]- Neuromuscular re-education, 97535- Self Care, 02859- Manual therapy, [id number]- Gait training, and [phone]- Aquatic Therapy  PLAN FOR NEXT SESSION: freezing of gait questionnaire, clock-face turns for FoG? (Trying to come up with activities that spouse can practice for turns/freezing of gait)   2:53 PM, 02/17/24 M. Kelly Maxmilian Trostel, PT, DPT Physical Therapist- [place] Office Number: [phone]

## 2024-02-17 NOTE — Patient Instructions (Signed)
 Coordination Exercises  Perform the following exercises for 10-15 minutes 2 times per day. Perform with both hand(s). Perform using big movements.  Flipping Cards: Place deck of cards on the table. Flip cards over by opening your hand big to grasp and then turn your palm up big. Deal cards: Hold 1/2 or whole deck in your hand. Use thumb to push card off top of deck with one big push. Pick up coins and place in coin bank or container: Pick up with big, intentional movements. Do not drag coin to the edge. Pick up coins and stack one at a time: Pick up with big, intentional movements. Do not drag coin to the edge. (5-10 in a stack) Pick up 5-10 coins one at a time and hold in palm. Then, move coins from palm to fingertips one at time and place in coin bank/container.

## 2024-02-18 DIAGNOSIS — M459 Ankylosing spondylitis of unspecified sites in spine: Secondary | ICD-10-CM | POA: Diagnosis not present

## 2024-02-19 DIAGNOSIS — M79674 Pain in right toe(s): Secondary | ICD-10-CM | POA: Diagnosis not present

## 2024-02-19 DIAGNOSIS — M79675 Pain in left toe(s): Secondary | ICD-10-CM | POA: Diagnosis not present

## 2024-02-19 DIAGNOSIS — B351 Tinea unguium: Secondary | ICD-10-CM | POA: Diagnosis not present

## 2024-02-24 ENCOUNTER — Ambulatory Visit

## 2024-02-24 ENCOUNTER — Ambulatory Visit: Attending: Neurology | Admitting: Occupational Therapy

## 2024-02-24 DIAGNOSIS — R29818 Other symptoms and signs involving the nervous system: Secondary | ICD-10-CM | POA: Diagnosis not present

## 2024-02-24 DIAGNOSIS — R262 Difficulty in walking, not elsewhere classified: Secondary | ICD-10-CM | POA: Insufficient documentation

## 2024-02-24 DIAGNOSIS — R2689 Other abnormalities of gait and mobility: Secondary | ICD-10-CM | POA: Diagnosis not present

## 2024-02-24 DIAGNOSIS — R278 Other lack of coordination: Secondary | ICD-10-CM | POA: Insufficient documentation

## 2024-02-24 DIAGNOSIS — R2681 Unsteadiness on feet: Secondary | ICD-10-CM | POA: Insufficient documentation

## 2024-02-24 DIAGNOSIS — R293 Abnormal posture: Secondary | ICD-10-CM | POA: Insufficient documentation

## 2024-02-24 DIAGNOSIS — M6281 Muscle weakness (generalized): Secondary | ICD-10-CM | POA: Insufficient documentation

## 2024-02-24 DIAGNOSIS — G20A2 Parkinson's disease without dyskinesia, with fluctuations: Secondary | ICD-10-CM | POA: Diagnosis not present

## 2024-02-24 NOTE — Therapy (Signed)
 OUTPATIENT OCCUPATIONAL THERAPY PARKINSON'S  Treatment Note  Patient Name: [name] MRN: [id number] DOB:09-11-1935, 88 y.o., male Today's Date: 02/24/2024  PCP: Yolande Toribio MATSU, MD REFERRING PROVIDER: Buck Saucer, MD  END OF SESSION:  OT End of Session - 02/24/24 1412     Visit Number 4    Number of Visits 7    Date for OT Re-Evaluation 03/05/24    Authorization Type Medicare A &B    OT Start Time 1406    OT Stop Time 1446    OT Time Calculation (min) 40 min             Past Medical History:  Diagnosis Date   Ankylosing spondylitis (HCC) dx'd ~ 1974   Arthritis    back (08/10/2015)   BENIGN PROSTATIC HYPERTROPHY, WITH OBSTRUCTION 01/15/2010   Bleeding duodenal ulcer    Bleeding esophageal ulcer    Bleeding stomach ulcer    GERD 02/22/2008   History of blood transfusion 08/26/1986   lost ~ 1/2 of my blood volume from multiple bleeding ulcers   History of hiatal hernia    HYPERGLYCEMIA 11/18/2007   HYPERLIPIDEMIA 02/22/2008   HYPERTENSION, UNSPECIFIED 11/10/2009   Kidney stones    Osteoarthritis    c-spine   Parkinson's disease (HCC)    PEPTIC ULCER DISEASE 11/17/2007   Situational depression    son died in MVA 06/07/2015   TIA (transient ischemic attack)    not that I know of in the past; they are trying to determine if I've had one today (08/10/2015)   Past Surgical History:  Procedure Laterality Date   CATARACT EXTRACTION W/ INTRAOCULAR LENS  IMPLANT, BILATERAL Bilateral 2013   Patient Active Problem List   Diagnosis Date Noted   Ankylosing spondylitis of cervicothoracic region (HCC) 10/16/2016   TIA (transient ischemic attack) 08/10/2015   Carotid stenosis 08/04/2014   Hyperlipidemia 02/21/2012   BENIGN PROSTATIC HYPERTROPHY, WITH OBSTRUCTION 01/15/2010   Essential hypertension 11/10/2009   GERD 02/22/2008   NEPHROLITHIASIS, HX OF 11/17/2007    ONSET DATE: referral date 12/09/23  REFERRING DIAG: R26.81 (ICD-10-CM) - Unsteadiness on  feet R27.8 (ICD-10-CM) - Other lack of coordination R29.3 (ICD-10-CM) - Abnormal posture G20.A2 (ICD-10-CM) - Parkinson's disease without dyskinesia, with fluctuations  THERAPY DIAG:  Other symptoms and signs involving the nervous system  Muscle weakness (generalized)  Other lack of coordination  Rationale for Evaluation and Treatment: Rehabilitation  SUBJECTIVE:   SUBJECTIVE STATEMENT: Pt reports using U-step for exercise, completing laps around the house.  Spouse expressing desire for pt to use it more for primary mobility.   Pt accompanied by: self (spouse, [name])  PERTINENT HISTORY: history of Parkinson's disease, duodenal and esophageal ulcers, ankylosing spondylitis, hypertension and hyperlipidemia  PRECAUTIONS: Fall  WEIGHT BEARING RESTRICTIONS: No  PAIN:  Are you having pain? No  FALLS: Has patient fallen in last 3 months? Yes. Number of falls 5-6 fall; however 2-3 falls where he has needed to have assist to get him up or go to ED  LIVING ENVIRONMENT: Lives with: lives with their spouse Lives in: House/apartment Stairs: Yes: External: 2-3 steps; has a ramp at entrance with raised railings Internal: steps to basement and steps to loft - however pt does not need to go up/down those at this time. Has following equipment at home: [name] - 2 wheeled, Environmental consultant - 4 wheeled, U-step, bed side commode, Shower chair, Ramped entry, and transport chair  PLOF: Requires assistive device for independence and Needs assistance with ADLs  PATIENT  GOALS: feel safe to get in to the shower  OBJECTIVE:  Note: Objective measures were completed at Evaluation unless otherwise noted.  HAND DOMINANCE: Right  ADLs: Overall ADLs: Pt reports there are times that he can be fairly independent with dressing but there are times where spouse has to provide significant assistance. Transfers/ambulation related to ADLs: Utilizing U-step for mobility in and outside of the home Eating: fumble  fingered UB Dressing: occasional assistance LB Dressing: sock and shoes continue to be difficult Toileting: Pt will occasionally require assistance to get up from toilet Bathing: sponge bathing at sink without assistance from shower chair seated at sink, spouse assisting with washing hair; Setup for washing feet Tub Shower transfers: not currently performing Equipment: Shower seat with back and Walk in shower with 5.5 ledge with 30 door  IADLs: Light housekeeping: washes dishes when he is feeling strong, however frequent onset of weakness Meal Prep: Pt was primary cook prior to fall in 2023, but has returned to participation in aspects of meal prep Community mobility: recently renewed drivers license, but is not driving  MOBILITY STATUS: Needs Assist: CGA with mobility with RW and Hx of falls   POSTURE COMMENTS:  rounded shoulders and forward head, posterior pelvic tilt and R lean  FUNCTIONAL OUTCOME MEASURES: Physical performance test: PPT#2 (simulated eating) 17.41 & PPT#4 (donning/doffing jacket): TBD  COORDINATION: 9 Hole Peg test: Right: 38.44 sec; Left: 61.81 sec  UE ROM:  all movements are slowed, decreased shoulder flexion d/t ankylosing spondylitis   UE MMT:   grossly 4/5 overall  COGNITION: Overall cognitive status: Spouse reports thinking continues to be slower  OBSERVATIONS: Bradykinesia                                                                                                                    TREATMENT DATE:  02/24/24 Bathroom modifications: spouse repors plan to order 21 height commode seat but was told by store that they were unable to order that height commode.  OT showing pt and spouse options for elevated toilet seat with and without arm rests to allow for safe attachment to current commode seat.  Pt and spouse voicing concern with a seat that would slip while he is transferring on/off or while seated.  OT voicing concern about seats or rails that have  foot pieces that could be a fall hazard.  OT then locating toilet seat riser with attached arm rests that appeared to be the best option for the pt at this time.  OT providing with picture and encouraged pt's spouse to f/u and do her own research to best option. Additional resources: OT educating about [place] and [facility] providing new online resource for Find Help  - to support pt with resources in their zip code for health related needs and resources.    02/17/24 Self-care: pt reports having his doors widened in the home which has made turns easier and maneuvering through room.  Pt reports using U-step in the home about half the  time but that there are still some passages in the home that are not navigable with the U-step.   Functional mobility: OT providing cues for large amplitude with sit > stand and ambulation with focus on bigger steps to increase upright mobility and safety with sit > stand and turns when approaching and backing up to seat - as pt with frequent falls coming to or from toilet. Coordination: with RUE, LUE, picking up various small coins and placing them into coin slot, picking up coins and stacking/unstacking them, and picking up stones and pushing stones into putty to simulate pushing buttons on phone and TV remote.  OT providing demonstration and verbal cues to incorporate finger flicks and large amplitude hand opening prior to picking up small items to facilitate increased amplitude and motor control.  Pt with improved ability to pick up coins vs slide across table immediately after finger flicks, however decreasing with repetition.      02/03/24 Box and blocks: Left: 30 blocks (hitting barrier x4) and Right: 36 blocks (hitting barrier x3) Card flipping: engaged in large amplitude movements with cards to provide feedback on amplitude.  Pt flipping cards with LUE with decreased amplitude (inward), OT providing cues to attend to visual pile and ensure reaching to target to  facilitate increased amplitude.  Pt flipping cards with RUE with decreased amplitude (downward), OT providing cues to attend to visual pile and ensure reaching to target to facilitate increased amplitude. OT then adding rolled towel to provide barrier to further facilitate clearance and reach.  OT educating on functional carryover with reaching for items on the dinner table and when placing cups into a cabinet.  OT encouraged pt to attempt folding laundry, in sitting, incorporating reaching outside BOS to upper and lower surfaces to incorporate amplitude.    PATIENT EDUCATION: Education details: Educated on role and purpose of OT as well as potential interventions and goals for therapy based on initial evaluation findings. Person educated: Patient and Spouse Education method: Explanation Education comprehension: verbalized understanding and needs further education  HOME EXERCISE PROGRAM: TBD  GOALS: Goals reviewed with patient? Yes  SHORT TERM GOALS: Target date: 02/13/24  Pt and spouse will be independent in full body HEP with focus on large amplitude movements as needed to maintain/preserve function as pertaining to ADLs/IADLs.  Baseline: Goal status: INITIAL  2.  Pt will demonstrate ability to complete sit > stand as needed to get up from toilet or other seating options in home by completing x3 throughout session without cues or physical assistance to maintain/preserve function.  Baseline:  Goal status: INITIAL  3.  Pt will demonstrate improved functional use of BUE and/or adaptive techniques to aid in increased independence with phone use and tv remove use. Baseline:  Goal status: INITIAL   LONG TERM GOALS: Target date: 03/05/24  Pt and spouse will report understanding of DME vs adaptive techniques/strategies and/or modifications to routines to increase safety with mobility and self-care tasks in the home.  Baseline:  Goal status: INITIAL  2.  Pt will report ability to complete  UB/LB dressing at Supervision/setup level with use of AE and/or alternative strategies PRN.  Baseline:  Goal status: INITIAL  3.  Pt will demonstrate improved independence with stand pivot transfers to carry over to shower transfers with supervision. Baseline:  Goal status: INITIAL  4.  Pt will demonstrate improved fine motor coordination for ADLs as evidenced by decreasing 9 hole peg test score for LUE by 10 secs Baseline: Right: 38.44 sec;  Left: 61.81 sec Goal status: INITIAL  ASSESSMENT:  CLINICAL IMPRESSION: Pt demonstrating increased shuffling of gait this session with smaller steps, especially during turns.  Pt continues to benefit from cues for larger strides.  Pt and spouse appreciative of recommendation of various DME to increase safety in bathroom, recommending toilet riser with arm rests as best option to meet their needs and concerns at this time.  Pt's spouse to look into options.  Pt will continue to benefit from skilled occupational therapy services to address strength and coordination, ROM, pain management, balance, GM/FM control, cognition, safety awareness, introduction of compensatory strategies/AE prn, and implementation of an HEP to improve participation and safety during ADLs and IADLs and quality of life.   PERFORMANCE DEFICITS:  in functional skills including ADLs, IADLs, coordination, dexterity, ROM, strength, pain, flexibility, Fine motor control, Gross motor control, balance, body mechanics, endurance, decreased knowledge of precautions, decreased knowledge of use of DME, and UE functional use and psychosocial skills including coping strategies, environmental adaptation, and routines and behaviors.   IMPAIRMENTS: are limiting patient from ADLs and IADLs.     PLAN:  OT FREQUENCY: 1x/week  OT DURATION: 6 weeks  PLANNED INTERVENTIONS: 97168 OT Re-evaluation, 97535 self care/ADL training, 02889 therapeutic exercise, 97530 therapeutic activity, 97112 neuromuscular  re-education, 97140 manual therapy, balance training, functional mobility training, psychosocial skills training, energy conservation, coping strategies training, patient/family education, and DME and/or AE instructions  RECOMMENDED OTHER SERVICES: NA  CONSULTED AND AGREED WITH PLAN OF CARE: Patient and family member/caregiver  PLAN FOR NEXT SESSION: any progress on toilet riser with arm rests?  coordination for use of phone and TV remote   KAYLENE DOMINO, OTR/L 02/24/2024, 3:46 PM   [facility] [address] [place], [address] [place], [place] [address] Phone # [phone] Fax # [phone]

## 2024-03-02 ENCOUNTER — Ambulatory Visit: Admitting: Occupational Therapy

## 2024-03-02 ENCOUNTER — Ambulatory Visit

## 2024-03-02 DIAGNOSIS — G20A2 Parkinson's disease without dyskinesia, with fluctuations: Secondary | ICD-10-CM

## 2024-03-02 DIAGNOSIS — R293 Abnormal posture: Secondary | ICD-10-CM | POA: Diagnosis not present

## 2024-03-02 DIAGNOSIS — R29818 Other symptoms and signs involving the nervous system: Secondary | ICD-10-CM

## 2024-03-02 DIAGNOSIS — R2681 Unsteadiness on feet: Secondary | ICD-10-CM | POA: Diagnosis not present

## 2024-03-02 DIAGNOSIS — R278 Other lack of coordination: Secondary | ICD-10-CM | POA: Diagnosis not present

## 2024-03-02 DIAGNOSIS — M6281 Muscle weakness (generalized): Secondary | ICD-10-CM

## 2024-03-02 DIAGNOSIS — R262 Difficulty in walking, not elsewhere classified: Secondary | ICD-10-CM

## 2024-03-02 DIAGNOSIS — R2689 Other abnormalities of gait and mobility: Secondary | ICD-10-CM

## 2024-03-02 NOTE — Therapy (Signed)
 OUTPATIENT OCCUPATIONAL THERAPY [facility]  Treatment Note  Patient Name: [name] MRN: [id number] DOB:06-27-1936, 88 y.o., male Today's Date: 03/02/2024  PCP: Yolande Toribio MATSU, MD REFERRING PROVIDER: Buck Saucer, MD  END OF SESSION:  OT End of Session - 03/02/24 1602     Visit Number 5    Number of Visits 7    Date for OT Re-Evaluation 03/05/24    Authorization Type Medicare A &B    OT Start Time 1407    OT Stop Time 1447    OT Time Calculation (min) 40 min              Past Medical History:  Diagnosis Date   Ankylosing spondylitis (HCC) dx'd ~ 1974   Arthritis    back (08/10/2015)   BENIGN PROSTATIC HYPERTROPHY, WITH OBSTRUCTION 01/15/2010   Bleeding duodenal ulcer    Bleeding esophageal ulcer    Bleeding stomach ulcer    GERD 02/22/2008   History of blood transfusion 08/26/1986   lost ~ 1/2 of my blood volume from multiple bleeding ulcers   History of hiatal hernia    HYPERGLYCEMIA 11/18/2007   HYPERLIPIDEMIA 02/22/2008   HYPERTENSION, UNSPECIFIED 11/10/2009   Kidney stones    Osteoarthritis    c-spine   Parkinson's disease (HCC)    PEPTIC ULCER DISEASE 11/17/2007   Situational depression    son died in MVA 2015/06/14   TIA (transient ischemic attack)    not that I know of in the past; they are trying to determine if I've had one today (08/10/2015)   Past Surgical History:  Procedure Laterality Date   CATARACT EXTRACTION W/ INTRAOCULAR LENS  IMPLANT, BILATERAL Bilateral 2013   Patient Active Problem List   Diagnosis Date Noted   Ankylosing spondylitis of cervicothoracic region (HCC) 10/16/2016   TIA (transient ischemic attack) 08/10/2015   Carotid stenosis 08/04/2014   Hyperlipidemia 02/21/2012   BENIGN PROSTATIC HYPERTROPHY, WITH OBSTRUCTION 01/15/2010   Essential hypertension 11/10/2009   GERD 02/22/2008   NEPHROLITHIASIS, HX OF 11/17/2007    ONSET DATE: referral date 12/09/23  REFERRING DIAG: R26.81 (ICD-10-CM) - Unsteadiness  on feet R27.8 (ICD-10-CM) - Other lack of coordination R29.3 (ICD-10-CM) - Abnormal posture G20.A2 (ICD-10-CM) - Parkinson's disease without dyskinesia, with fluctuations  THERAPY DIAG:  Other symptoms and signs involving the nervous system  Muscle weakness (generalized)  Rationale for Evaluation and Treatment: Rehabilitation  SUBJECTIVE:   SUBJECTIVE STATEMENT: Pt reports yesterday I tried to bounce off the linoleum floor.  Pt reports that he was trying to carry multiple items into the kitchen.    Pt accompanied by: self (spouse, [name])  PERTINENT HISTORY: history of Parkinson's disease, duodenal and esophageal ulcers, ankylosing spondylitis, hypertension and hyperlipidemia  PRECAUTIONS: Fall  WEIGHT BEARING RESTRICTIONS: No  PAIN:  Are you having pain? No  FALLS: Has patient fallen in last 3 months? Yes. Number of falls 5-6 fall; however 2-3 falls where he has needed to have assist to get him up or go to ED  LIVING ENVIRONMENT: Lives with: lives with their spouse Lives in: House/apartment Stairs: Yes: External: 2-3 steps; has a ramp at entrance with raised railings Internal: steps to basement and steps to loft - however pt does not need to go up/down those at this time. Has following equipment at home: [name] - 2 wheeled, Environmental consultant - 4 wheeled, U-step, bed side commode, Shower chair, Ramped entry, and transport chair  PLOF: Requires assistive device for independence and Needs assistance with ADLs  PATIENT GOALS: feel  safe to get in to the shower  OBJECTIVE:  Note: Objective measures were completed at Evaluation unless otherwise noted.  HAND DOMINANCE: Right  ADLs: Overall ADLs: Pt reports there are times that he can be fairly independent with dressing but there are times where spouse has to provide significant assistance. Transfers/ambulation related to ADLs: Utilizing U-step for mobility in and outside of the home Eating: fumble fingered UB Dressing: occasional  assistance LB Dressing: sock and shoes continue to be difficult Toileting: Pt will occasionally require assistance to get up from toilet Bathing: sponge bathing at sink without assistance from shower chair seated at sink, spouse assisting with washing hair; Setup for washing feet Tub Shower transfers: not currently performing Equipment: Shower seat with back and Walk in shower with 5.5 ledge with 30 door  IADLs: Light housekeeping: washes dishes when he is feeling strong, however frequent onset of weakness Meal Prep: Pt was primary cook prior to fall in 2023, but has returned to participation in aspects of meal prep Community mobility: recently renewed drivers license, but is not driving  MOBILITY STATUS: Needs Assist: CGA with mobility with RW and Hx of falls   POSTURE COMMENTS:  rounded shoulders and forward head, posterior pelvic tilt and R lean  FUNCTIONAL OUTCOME MEASURES: Physical performance test: PPT#2 (simulated eating) 17.41 & PPT#4 (donning/doffing jacket): TBD  COORDINATION: 9 Hole Peg test: Right: 38.44 sec; Left: 61.81 sec  UE ROM:  all movements are slowed, decreased shoulder flexion d/t ankylosing spondylitis   UE MMT:   grossly 4/5 overall  COGNITION: Overall cognitive status: Spouse reports thinking continues to be slower  OBSERVATIONS: Bradykinesia                                                                                                                    TREATMENT DATE:  03/02/24 Additional resources: OT assisted spouse with navigating through [facility] and [facility] online resource Find Help.  Still with limited resources for respite care and/or physical assistance for patient due to location in [place], [place]  Pt's spouse reports that she was unable to locate toilet seat riser with attached arm rests in the store but plans to pursue ordering online.    Phone use: pt reports difficulty with use of both cell phone and landline.  Pt frequently with  difficulty pushing correct buttons on phones as well as TV remote.  OT educating on use of stylus to aid in precision with pushing buttons.  Engaged in simulated dialing of phone and navigating through spouse's phone with significantly increased ease with use of stylus.  Pt will benefit from trial with personal cell phone and home phone.      02/24/24 Bathroom modifications: spouse repors plan to order 21 height commode seat but was told by store that they were unable to order that height commode.  OT showing pt and spouse options for elevated toilet seat with and without arm rests to allow for safe attachment to current commode seat.  Pt and spouse  voicing concern with a seat that would slip while he is transferring on/off or while seated.  OT voicing concern about seats or rails that have foot pieces that could be a fall hazard.  OT then locating toilet seat riser with attached arm rests that appeared to be the best option for the pt at this time.  OT providing with picture and encouraged pt's spouse to f/u and do her own research to best option. Additional resources: OT educating about [place] [facility] providing new online resource for Find Help  - to support pt with resources in their zip code for health related needs and resources.    02/17/24 Self-care: pt reports having his doors widened in the home which has made turns easier and maneuvering through room.  Pt reports using U-step in the home about half the time but that there are still some passages in the home that are not navigable with the U-step.   Functional mobility: OT providing cues for large amplitude with sit > stand and ambulation with focus on bigger steps to increase upright mobility and safety with sit > stand and turns when approaching and backing up to seat - as pt with frequent falls coming to or from toilet. Coordination: with RUE, LUE, picking up various small coins and placing them into coin slot, picking up coins and  stacking/unstacking them, and picking up stones and pushing stones into putty to simulate pushing buttons on phone and TV remote.  OT providing demonstration and verbal cues to incorporate finger flicks and large amplitude hand opening prior to picking up small items to facilitate increased amplitude and motor control.  Pt with improved ability to pick up coins vs slide across table immediately after finger flicks, however decreasing with repetition.     PATIENT EDUCATION: Education details: Adaptive strategies to increase ease/success with phone and TV remote Person educated: Patient and Spouse Education method: Explanation Education comprehension: verbalized understanding and needs further education  HOME EXERCISE PROGRAM: TBD  GOALS: Goals reviewed with patient? Yes  SHORT TERM GOALS: Target date: 02/13/24  Pt and spouse will be independent in full body HEP with focus on large amplitude movements as needed to maintain/preserve function as pertaining to ADLs/IADLs.  Baseline: Goal status: INITIAL  2.  Pt will demonstrate ability to complete sit > stand as needed to get up from toilet or other seating options in home by completing x3 throughout session without cues or physical assistance to maintain/preserve function.  Baseline:  Goal status: INITIAL  3.  Pt will demonstrate improved functional use of BUE and/or adaptive techniques to aid in increased independence with phone use and tv remove use. Baseline:  Goal status: INITIAL   LONG TERM GOALS: Target date: 03/05/24  Pt and spouse will report understanding of DME vs adaptive techniques/strategies and/or modifications to routines to increase safety with mobility and self-care tasks in the home.  Baseline:  Goal status: INITIAL  2.  Pt will report ability to complete UB/LB dressing at Supervision/setup level with use of AE and/or alternative strategies PRN.  Baseline:  Goal status: INITIAL  3.  Pt will demonstrate improved  independence with stand pivot transfers to carry over to shower transfers with supervision. Baseline:  Goal status: INITIAL  4.  Pt will demonstrate improved fine motor coordination for ADLs as evidenced by decreasing 9 hole peg test score for LUE by 10 secs Baseline: Right: 38.44 sec; Left: 61.81 sec Goal status: INITIAL  ASSESSMENT:  CLINICAL IMPRESSION: Pt demonstrating  increased shuffling of gait this session with smaller steps, especially during turns.  Pt continues to benefit from cues for targets and sharp turns when turning to maintain larger strides.  Pt's spouse still looking at DME options for toilet as unable to purchase desired toilet riser with arm rests in the store.  Pt benefiting from use of stylus to correctly select numbers on phone pad, will continue to benefit from practice on personal phone and/or TV remote.  Pt will continue to benefit from skilled occupational therapy services to address balance, GM/FM control, cognition, safety awareness, introduction of compensatory strategies/AE prn, and implementation of an HEP to improve participation and safety during ADLs and IADLs and quality of life.   PERFORMANCE DEFICITS:  in functional skills including ADLs, IADLs, coordination, dexterity, ROM, strength, pain, flexibility, Fine motor control, Gross motor control, balance, body mechanics, endurance, decreased knowledge of precautions, decreased knowledge of use of DME, and UE functional use and psychosocial skills including coping strategies, environmental adaptation, and routines and behaviors.   IMPAIRMENTS: are limiting patient from ADLs and IADLs.     PLAN:  OT FREQUENCY: 1x/week  OT DURATION: 6 weeks  PLANNED INTERVENTIONS: 97168 OT Re-evaluation, 97535 self care/ADL training, 02889 therapeutic exercise, 97530 therapeutic activity, 97112 neuromuscular re-education, 97140 manual therapy, balance training, functional mobility training, psychosocial skills training,  energy conservation, coping strategies training, patient/family education, and DME and/or AE instructions  RECOMMENDED OTHER SERVICES: NA  CONSULTED AND AGREED WITH PLAN OF CARE: Patient and family member/caregiver  PLAN FOR NEXT SESSION: any progress on toilet riser with arm rests?  coordination for use of phone and TV remote  Re-cert vs D/C - what are goals that you want to pursue   KAYLENE DOMINO, OTR/L 03/02/2024, 4:03 PM   [facility] at [facility] [address] [place], [place] [address] Phone # [phone] Fax # [phone]

## 2024-03-02 NOTE — Therapy (Signed)
 OUTPATIENT PHYSICAL THERAPY NEURO TREATMENT   Patient Name: [name] MRN: [id number] DOB:01-18-1936, 88 y.o., male Today's Date: 03/02/2024   PCP: Yolande Toribio MATSU, MD REFERRING PROVIDER: Buck Saucer, MD  END OF SESSION:  PT End of Session - 03/02/24 1444     Visit Number 4    Number of Visits 9    Date for PT Re-Evaluation 03/16/24    Authorization Type Medicare/[employer]ARP    Progress Note Due on Visit 10    PT Start Time 1445    PT Stop Time 1530    PT Time Calculation (min) 45 min          Past Medical History:  Diagnosis Date   Ankylosing spondylitis (HCC) dx'd ~ 1974   Arthritis    back (08/10/2015)   BENIGN PROSTATIC HYPERTROPHY, WITH OBSTRUCTION 01/15/2010   Bleeding duodenal ulcer    Bleeding esophageal ulcer    Bleeding stomach ulcer    GERD 02/22/2008   History of blood transfusion 08/26/1986   lost ~ 1/2 of my blood volume from multiple bleeding ulcers   History of hiatal hernia    HYPERGLYCEMIA 11/18/2007   HYPERLIPIDEMIA 02/22/2008   HYPERTENSION, UNSPECIFIED 11/10/2009   Kidney stones    Osteoarthritis    c-spine   Parkinson's disease (HCC)    PEPTIC ULCER DISEASE 11/17/2007   Situational depression    son died in MVA 08-Jul-2015   TIA (transient ischemic attack)    not that I know of in the past; they are trying to determine if I've had one today (08/10/2015)   Past Surgical History:  Procedure Laterality Date   CATARACT EXTRACTION W/ INTRAOCULAR LENS  IMPLANT, BILATERAL Bilateral 2013   Patient Active Problem List   Diagnosis Date Noted   Ankylosing spondylitis of cervicothoracic region (HCC) 10/16/2016   TIA (transient ischemic attack) 08/10/2015   Carotid stenosis 08/04/2014   Hyperlipidemia 02/21/2012   BENIGN PROSTATIC HYPERTROPHY, WITH OBSTRUCTION 01/15/2010   Essential hypertension 11/10/2009   GERD 02/22/2008   NEPHROLITHIASIS, HX OF 11/17/2007    ONSET DATE: PD x years onset  REFERRING DIAG: R26.81 (ICD-10-CM) -  Unsteadiness on feet R29.3 (ICD-10-CM) - Abnormal posture R27.8 (ICD-10-CM) - Other lack of coordination G20.A2 (ICD-10-CM) - Parkinson's disease with fluctuating manifestations, unspecified whether dyskinesia present (HCC)  THERAPY DIAG:  Other symptoms and signs involving the nervous system  Muscle weakness (generalized)  Unsteadiness on feet  Abnormal posture  Parkinson's disease with fluctuating manifestations, unspecified whether dyskinesia present (HCC)  Other abnormalities of gait and mobility  Difficulty in walking, not elsewhere classified  Rationale for Evaluation and Treatment: Rehabilitation  SUBJECTIVE:  SUBJECTIVE STATEMENT: Has had one fall since last session, no other. Going to the gym occasionally Pt accompanied by: significant other  PERTINENT HISTORY: complex medical history of peptic ulcer disease with history of upper GI bleed, TIA, depression, degenerative disc disease in the neck, osteoarthritis, kidney stones, hyperlipidemia, hypertension, history of hiatal hernia, BPH, arthritis, and ankylosing spondylitis, and parkinsonism  PAIN:  Are you having pain? No worse for chronic pain, no acute pain  PRECAUTIONS: Fall  RED FLAGS: None   WEIGHT BEARING RESTRICTIONS: No  FALLS: Has patient fallen in last 3 months? Unsure as to number, notes fall/LOB every other week  LIVING ENVIRONMENT: Lives with: lives with their spouse Lives in: House/apartment Stairs: Ramp to enter, stairs to loft and basement, ground floor set-up Has following equipment at home: [name] - 2 wheeled  PLOF: Needs assistance with ADLs, Needs assistance with homemaking, and Needs assistance with gait  PATIENT GOALS:   OBJECTIVE:   TODAY'S TREATMENT: 03/02/24 Activity Comments  NU-step level 2 x 5 min  For dynamic warm-up  Transfer training -techniques for retropulsion: hand placement, red t-band on back of belt -techniques for safe turning for stand to sit  Gait training -techniques for step initiation, step length, height w/ visual cues              TODAY'S TREATMENT: 02/17/24 Activity Comments  NU-step level 4 x 8 min Cues for 70+ SPM  Lateral step over x 2 min Half roll bolster BUE support  Forwards/backwards x 2 min Half roll bolster BUE support  Gait training -strategies for turning to address FoG -strategies for stride length and step height -sit to stand step over 6 hurdles  5xSTS test 37 sec w/ UE support  Static standing EO/EC On firm, 7-9 sec before LOB w/ eyes closed     TODAY'S TREATMENT: 02/03/24 Activity Comments  Pt/caregiver education/strategies Freezing of gait strategies, provided visual cues for clockface turning, walking with metronome, counted steps, etc  Gait training -trials with metronome and counted steps, visual cues,   Multisensory balance On foam             Note: Objective measures were completed at Evaluation unless otherwise noted.  DIAGNOSTIC FINDINGS:   COGNITION: Overall cognitive status: Within functional limits for tasks assessed and some instances of difficulty attending to task/sequencing   SENSATION: Not tested  COORDINATION: Deficits to rapid alternating movements    MUSCLE TONE: rigidity  MUSCLE LENGTH: 10-15 degree knee flexion contracture bilat   POSTURE: rounded shoulders, forward head, and increased thoracic kyphosis  LOWER EXTREMITY ROM:     Right ankle ROM limits from fusion  LOWER EXTREMITY MMT:    4/5 gross BLE  BED MOBILITY:  Findings: NT, reports fluctuating performance  TRANSFERS: Sit to stand: Modified independence and CGA  Assistive device utilized: U-Step     Stand to sit: CGA  Assistive device utilized: u-step     Chair to chair: Modified independence and CGA  Assistive device utilized: U-step          CURB:  Findings: CGA-min A  STAIRS: Findings: Comments: CGA-min A GAIT: Findings: Distance walked: 150, Assistive device utilized:U-step, Level of assistance: CGA, and Comments: freezing in turns  FUNCTIONAL TESTS:  5 times sit to stand: 47.25 sec Timed up and go (TUG): 36 sec w/ U-step 10 meter walk test: 23.34 sec = 1.4 ft/sec w/ Shan Levins Balance Scale:  180 degree turns: 16-18 steps  Item Test date: 01/20/24 Test date:  Test date:  Sitting to standing 2. able to stand using hands after several tries Insert OPRCBERGREEVAL SmartPhrase at re-test date Insert OPRCBERGREEVAL SmartPhrase at re-test date  2. Standing unsupported 3. able to stand 2 minutes with supervision    3. Sitting with back unsupported, feet supported 4. able to sit safely and securely for 2 minutes    4. Standing to sitting 2. uses back of legs against chair to control descent    5. Pivot transfer  2. able to transfer with verbal cuing and/or supervision    6. Standing unsupported with eyes closed 3. able to stand 10 seconds with supervision    7. Standing unsupported with feet together 3. able to place feet together independently and stand 1 minute with supervision    8. Reaching forward with outstretched arms while standing 3. can reach forward 12 cm (5 inches)    9. Pick up object from the floor from standing 0. unable to try/needs assistance to keep from losing balance or falling    10. Turning to look behind over left and right shoulders while standing 2. turns sideways but only maintains balance    11. Turn 360 degrees 0. needs assistance while turning    12. Place alternate foot on step or stool while standing unsupported 1. able to complete > 2 steps needs minimal assist    13. Standing unsupported one foot in front 0. loses balance while stepping or standing    14. Standing on one leg 0. unable to try of needs assist to prevent fall      Total Score 25/56 Total Score /56 Total Score /56      PATIENT SURVEYS:  Freezing of gait questionnaire provided to fill out and return                                                                                                                              TREATMENT DATE:     PATIENT EDUCATION:a Education details: assessment details, rationale of tx interventions Person educated: Patient and Spouse Education method: Explanation Education comprehension: verbalized understanding  HOME EXERCISE PROGRAM: TBD  GOALS: Goals reviewed with patient? Yes  SHORT TERM GOALS: Target date: 02/17/2024    Patient will perform HEP with family/caregiver supervision for improved strength, balance, transfers, and gait  Baseline: Goal status: MET  2.  Demo improved postural control and BLE strength per time 35 sec 5xSTS test Baseline: 47 sec; 37 sec Goal status: NOT MET    LONG TERM GOALS: Target date: 03/16/2024    Demo improved static strength and safety with ADL per score 40/56 Berg Balance Test Baseline: 25/56 Goal status: INITIAL  2.  Demo improved safety/efficiency of turning negotiating 180 degree turn in 10-12 steps Baseline: 16-18 w/ U-step Goal status: INITIAL  3.  Demo improved safety with mobility per time 25 sec TUG test Baseline: 36 sec w/ U-step Goal status: INITIAL  4.  Spouse to teach-back strategies to help with  reducing freezing of gait Baseline:  Goal status: INITIAL   ASSESSMENT:  CLINICAL IMPRESSION: Transfer training to improve safety and sequence with sit to stand and stand-pivot transfers to improve techniques for overcoming retro-pulsion. Difficulty with safety awareness/sequence for pivoting w/ verbal/tactile cues for UE placement. Gait training strategies to improve step height/length and negotiation of obstacles w/ HHA for guided sequence and improved carryover from min A to CGA.  Provided HEP update for sit to stand w/ resistance pulling posteriorly to train for overcompensation against retropulsion  with spouse demo good carryover  OBJECTIVE IMPAIRMENTS: Abnormal gait, decreased activity tolerance, decreased balance, decreased coordination, decreased mobility, difficulty walking, decreased strength, improper body mechanics, and postural dysfunction.   ACTIVITY LIMITATIONS: carrying, lifting, bending, standing, squatting, stairs, transfers, bed mobility, reach over head, and locomotion level  PARTICIPATION LIMITATIONS: meal prep, cleaning, shopping, community activity, and ADL  PERSONAL FACTORS: Age, Time since onset of injury/illness/exacerbation, and 3+ comorbidities: PMH are also affecting patient's functional outcome.   REHAB POTENTIAL: Good  CLINICAL DECISION MAKING: Unstable/unpredictable  EVALUATION COMPLEXITY: High  PLAN:  PT FREQUENCY: 1x/week  PT DURATION: 8 weeks  PLANNED INTERVENTIONS: 97750- Physical Performance Testing, 97110-Therapeutic exercises, 97530- Therapeutic activity, [id number]- Neuromuscular re-education, 97535- Self Care, 02859- Manual therapy, [id number]- Gait training, and [phone]- Aquatic Therapy  PLAN FOR NEXT SESSION: freezing of gait questionnaire, clock-face turns for FoG? (Trying to come up with activities that spouse can practice for turns/freezing of gait)   2:44 PM, 03/02/24 M. Kelly Keiana Tavella, PT, DPT Physical Therapist- [place] Office Number: [phone]

## 2024-03-09 ENCOUNTER — Ambulatory Visit: Admitting: Occupational Therapy

## 2024-03-09 ENCOUNTER — Ambulatory Visit

## 2024-03-09 DIAGNOSIS — R29818 Other symptoms and signs involving the nervous system: Secondary | ICD-10-CM | POA: Diagnosis not present

## 2024-03-09 DIAGNOSIS — M6281 Muscle weakness (generalized): Secondary | ICD-10-CM | POA: Diagnosis not present

## 2024-03-09 DIAGNOSIS — R2681 Unsteadiness on feet: Secondary | ICD-10-CM

## 2024-03-09 DIAGNOSIS — R293 Abnormal posture: Secondary | ICD-10-CM | POA: Diagnosis not present

## 2024-03-09 DIAGNOSIS — G20A2 Parkinson's disease without dyskinesia, with fluctuations: Secondary | ICD-10-CM | POA: Diagnosis not present

## 2024-03-09 DIAGNOSIS — R278 Other lack of coordination: Secondary | ICD-10-CM | POA: Diagnosis not present

## 2024-03-09 DIAGNOSIS — R2689 Other abnormalities of gait and mobility: Secondary | ICD-10-CM

## 2024-03-09 DIAGNOSIS — R262 Difficulty in walking, not elsewhere classified: Secondary | ICD-10-CM

## 2024-03-09 NOTE — Therapy (Signed)
 OUTPATIENT PHYSICAL THERAPY NEURO TREATMENT   Patient Name: [name] MRN: [id number] DOB:Jan 09, 1936, 88 y.o., male Today's Date: 03/09/2024   PCP: Yolande Toribio MATSU, MD REFERRING PROVIDER: Buck Saucer, MD  END OF SESSION:  PT End of Session - 03/09/24 1441     Visit Number 5    Number of Visits 9    Date for PT Re-Evaluation 03/16/24    Authorization Type Medicare/AARP    Progress Note Due on Visit 10    PT Start Time 1445    PT Stop Time 1530    PT Time Calculation (min) 45 min          Past Medical History:  Diagnosis Date   Ankylosing spondylitis (HCC) dx'd ~ 1974   Arthritis    back (08/10/2015)   BENIGN PROSTATIC HYPERTROPHY, WITH OBSTRUCTION 01/15/2010   Bleeding duodenal ulcer    Bleeding esophageal ulcer    Bleeding stomach ulcer    GERD 02/22/2008   History of blood transfusion 08/26/1986   lost ~ 1/2 of my blood volume from multiple bleeding ulcers   History of hiatal hernia    HYPERGLYCEMIA 11/18/2007   HYPERLIPIDEMIA 02/22/2008   HYPERTENSION, UNSPECIFIED 11/10/2009   Kidney stones    Osteoarthritis    c-spine   Parkinson's disease (HCC)    PEPTIC ULCER DISEASE 11/17/2007   Situational depression    son died in MVA Jun 23, 2015   TIA (transient ischemic attack)    not that I know of in the past; they are trying to determine if I've had one today (08/10/2015)   Past Surgical History:  Procedure Laterality Date   CATARACT EXTRACTION W/ INTRAOCULAR LENS  IMPLANT, BILATERAL Bilateral 2013   Patient Active Problem List   Diagnosis Date Noted   Ankylosing spondylitis of cervicothoracic region (HCC) 10/16/2016   TIA (transient ischemic attack) 08/10/2015   Carotid stenosis 08/04/2014   Hyperlipidemia 02/21/2012   BENIGN PROSTATIC HYPERTROPHY, WITH OBSTRUCTION 01/15/2010   Essential hypertension 11/10/2009   GERD 02/22/2008   NEPHROLITHIASIS, HX OF 11/17/2007    ONSET DATE: PD x years onset  REFERRING DIAG: R26.81 (ICD-10-CM) -  Unsteadiness on feet R29.3 (ICD-10-CM) - Abnormal posture R27.8 (ICD-10-CM) - Other lack of coordination G20.A2 (ICD-10-CM) - Parkinson's disease with fluctuating manifestations, unspecified whether dyskinesia present (HCC)  THERAPY DIAG:  Other symptoms and signs involving the nervous system  Muscle weakness (generalized)  Unsteadiness on feet  Abnormal posture  Parkinson's disease with fluctuating manifestations, unspecified whether dyskinesia present (HCC)  Other abnormalities of gait and mobility  Difficulty in walking, not elsewhere classified  Rationale for Evaluation and Treatment: Rehabilitation  SUBJECTIVE:  SUBJECTIVE STATEMENT: It's been a rough week of PD symptoms. Having difficulty with negotiating/walking in house, sceptical of U-step in tight quarters Pt accompanied by: significant other  PERTINENT HISTORY: complex medical history of peptic ulcer disease with history of upper GI bleed, TIA, depression, degenerative disc disease in the neck, osteoarthritis, kidney stones, hyperlipidemia, hypertension, history of hiatal hernia, BPH, arthritis, and ankylosing spondylitis, and parkinsonism  PAIN:  Are you having pain? No worse for chronic pain, no acute pain  PRECAUTIONS: Fall  RED FLAGS: None   WEIGHT BEARING RESTRICTIONS: No  FALLS: Has patient fallen in last 3 months? Unsure as to number, notes fall/LOB every other week  LIVING ENVIRONMENT: Lives with: lives with their spouse Lives in: House/apartment Stairs: Ramp to enter, stairs to loft and basement, ground floor set-up Has following equipment at home: [name] - 2 wheeled  PLOF: Needs assistance with ADLs, Needs assistance with homemaking, and Needs assistance with gait  PATIENT GOALS:   OBJECTIVE:   TODAY'S  TREATMENT: 03/09/24 Activity Comments  Transfer training -supervision sit to stand and stand-pivot w/ RW--cues for UE placement/sequence -  NU-step level 5 x 8 min Steady state 70-80 SPM  Gait training RW w/ 3# ankle weights for added weight and supervision-SBA level surfaces w/ trials for faster pace/greater stride length and cues for negotiating turns w/ various strategies  Kicking physioball x 1 min Single arm row x 10 reps 15# 3 round circuit  Sit to stand 5x w/ HR Seated lat row 10x 20# 3 round circuit        TODAY'S TREATMENT: 03/02/24 Activity Comments  NU-step level 2 x 5 min For dynamic warm-up  Transfer training -techniques for retropulsion: hand placement, red t-band on back of belt -techniques for safe turning for stand to sit  Gait training -techniques for step initiation, step length, height w/ visual cues                  Note: Objective measures were completed at Evaluation unless otherwise noted.  DIAGNOSTIC FINDINGS:   COGNITION: Overall cognitive status: Within functional limits for tasks assessed and some instances of difficulty attending to task/sequencing   SENSATION: Not tested  COORDINATION: Deficits to rapid alternating movements    MUSCLE TONE: rigidity  MUSCLE LENGTH: 10-15 degree knee flexion contracture bilat   POSTURE: rounded shoulders, forward head, and increased thoracic kyphosis  LOWER EXTREMITY ROM:     Right ankle ROM limits from fusion  LOWER EXTREMITY MMT:    4/5 gross BLE  BED MOBILITY:  Findings: NT, reports fluctuating performance  TRANSFERS: Sit to stand: Modified independence and CGA  Assistive device utilized: U-Step     Stand to sit: CGA  Assistive device utilized: u-step     Chair to chair: Modified independence and CGA  Assistive device utilized: U-step         CURB:  Findings: CGA-min A  STAIRS: Findings: Comments: CGA-min A GAIT: Findings: Distance walked: 150, Assistive device utilized:U-step,  Level of assistance: CGA, and Comments: freezing in turns  FUNCTIONAL TESTS:  5 times sit to stand: 47.25 sec Timed up and go (TUG): 36 sec w/ U-step 10 meter walk test: 23.34 sec = 1.4 ft/sec w/ U-step Berg Balance Scale:  180 degree turns: 16-18 steps  Item Test date: 01/20/24 Test date:  Test date:   Sitting to standing 2. able to stand using hands after several tries Insert OPRCBERGREEVAL SmartPhrase at re-test date Insert OPRCBERGREEVAL SmartPhrase at re-test date  2. Standing unsupported  3. able to stand 2 minutes with supervision    3. Sitting with back unsupported, feet supported 4. able to sit safely and securely for 2 minutes    4. Standing to sitting 2. uses back of legs against chair to control descent    5. Pivot transfer  2. able to transfer with verbal cuing and/or supervision    6. Standing unsupported with eyes closed 3. able to stand 10 seconds with supervision    7. Standing unsupported with feet together 3. able to place feet together independently and stand 1 minute with supervision    8. Reaching forward with outstretched arms while standing 3. can reach forward 12 cm (5 inches)    9. Pick up object from the floor from standing 0. unable to try/needs assistance to keep from losing balance or falling    10. Turning to look behind over left and right shoulders while standing 2. turns sideways but only maintains balance    11. Turn 360 degrees 0. needs assistance while turning    12. Place alternate foot on step or stool while standing unsupported 1. able to complete > 2 steps needs minimal assist    13. Standing unsupported one foot in front 0. loses balance while stepping or standing    14. Standing on one leg 0. unable to try of needs assist to prevent fall      Total Score 25/56 Total Score /56 Total Score /56     PATIENT SURVEYS:  Freezing of gait questionnaire provided to fill out and return                                                                                                                               TREATMENT DATE:     PATIENT EDUCATION:a Education details: assessment details, rationale of tx interventions Person educated: Patient and Spouse Education method: Explanation Education comprehension: verbalized understanding  HOME EXERCISE PROGRAM: TBD  GOALS: Goals reviewed with patient? Yes  SHORT TERM GOALS: Target date: 02/17/2024    Patient will perform HEP with family/caregiver supervision for improved strength, balance, transfers, and gait  Baseline: Goal status: MET  2.  Demo improved postural control and BLE strength per time 35 sec 5xSTS test Baseline: 47 sec; 37 sec Goal status: NOT MET    LONG TERM GOALS: Target date: 03/16/2024    Demo improved static strength and safety with ADL per score 40/56 Berg Balance Test Baseline: 25/56 Goal status: INITIAL  2.  Demo improved safety/efficiency of turning negotiating 180 degree turn in 10-12 steps Baseline: 16-18 w/ U-step Goal status: INITIAL  3.  Demo improved safety with mobility per time 25 sec TUG test Baseline: 36 sec w/ U-step Goal status: INITIAL  4.  Spouse to teach-back strategies to help with reducing freezing of gait Baseline:  Goal status: INITIAL   ASSESSMENT:  CLINICAL IMPRESSION: Transfer training to improve safety with manuevering in tight spaces with and without  AD w/ verbal cues in sequence/UE placement for improved safety awareness.  Gait training trials to improve stride length and safety in turns w/ improved performance using added weight to walker and verbal cues in step length to avoid crossing midline with improved stability throughout. Dynamic balance activities to facilitate single limb support and reactive balance strategies to reduce risk for falls.  Continued sessions to provide ongoing caregiver training in safe mobility and balance practices to rehearse for HEP  OBJECTIVE IMPAIRMENTS: Abnormal gait, decreased activity tolerance,  decreased balance, decreased coordination, decreased mobility, difficulty walking, decreased strength, improper body mechanics, and postural dysfunction.   ACTIVITY LIMITATIONS: carrying, lifting, bending, standing, squatting, stairs, transfers, bed mobility, reach over head, and locomotion level  PARTICIPATION LIMITATIONS: meal prep, cleaning, shopping, community activity, and ADL  PERSONAL FACTORS: Age, Time since onset of injury/illness/exacerbation, and 3+ comorbidities: PMH are also affecting patient's functional outcome.   REHAB POTENTIAL: Good  CLINICAL DECISION MAKING: Unstable/unpredictable  EVALUATION COMPLEXITY: High  PLAN:  PT FREQUENCY: 1x/week  PT DURATION: 8 weeks  PLANNED INTERVENTIONS: 97750- Physical Performance Testing, 97110-Therapeutic exercises, 97530- Therapeutic activity, [id number]- Neuromuscular re-education, 97535- Self Care, 02859- Manual therapy, [id number]- Gait training, and [phone]- Aquatic Therapy  PLAN FOR NEXT SESSION: freezing of gait questionnaire, clock-face turns for FoG? (Trying to come up with activities that spouse can practice for turns/freezing of gait)   2:42 PM, 03/09/24 M. Kelly Amana Bouska, PT, DPT Physical Therapist- [place] Office Number: [phone]

## 2024-03-09 NOTE — Therapy (Signed)
 OUTPATIENT OCCUPATIONAL THERAPY PARKINSON'S  Treatment Note & Discharge Note  Patient Name: [name] MRN: [id number] DOB:[birth year], 88 y.o., male Today's Date: 03/09/2024  PCP: Yolande Toribio MATSU, MD REFERRING PROVIDER: Buck Saucer, MD  OCCUPATIONAL THERAPY DISCHARGE SUMMARY  Visits from Start of Care: 6  Current functional level related to goals / functional outcomes: Pt has met 3 of 3 STGs and 1 of 4 LTGs.  Pt and spouse have verbalized understanding of AE/DME to aid in independence and safety with ADLs.  They have made some modifications and are still researching and awaiting some equipment.  Primary focus has been on accessing resources for equipment and support in the home.  Pt and spouse to pursue involvement in PD support group for additional resources.   Remaining deficits: Impaired coordination, ease with ADLs, and bathroom transfers   Education / Equipment: Large amplitude exercises, PD resources   Patient agrees to discharge. Patient goals were partially met. Patient is being discharged due to maximized rehab potential.  With plan to access resources via [facility] Neurology movement disorder and local PD support group.      END OF SESSION:  OT End of Session - 03/09/24 1521     Visit Number 6    Number of Visits 7    Date for OT Re-Evaluation 03/05/24    Authorization Type Medicare A &B    OT Start Time 1406    OT Stop Time 1446    OT Time Calculation (min) 40 min               Past Medical History:  Diagnosis Date   Ankylosing spondylitis (HCC) dx'd ~ 1974   Arthritis    back (08/10/2015)   BENIGN PROSTATIC HYPERTROPHY, WITH OBSTRUCTION 01/15/2010   Bleeding duodenal ulcer    Bleeding esophageal ulcer    Bleeding stomach ulcer    GERD 02/22/2008   History of blood transfusion 08/26/1986   lost ~ 1/2 of my blood volume from multiple bleeding ulcers   History of hiatal hernia    HYPERGLYCEMIA 11/18/2007   HYPERLIPIDEMIA 02/22/2008    HYPERTENSION, UNSPECIFIED 11/10/2009   Kidney stones    Osteoarthritis    c-spine   Parkinson's disease (HCC)    PEPTIC ULCER DISEASE 11/17/2007   Situational depression    son died in MVA 07-14-15   TIA (transient ischemic attack)    not that I know of in the past; they are trying to determine if I've had one today (08/10/2015)   Past Surgical History:  Procedure Laterality Date   CATARACT EXTRACTION W/ INTRAOCULAR LENS  IMPLANT, BILATERAL Bilateral 2013   Patient Active Problem List   Diagnosis Date Noted   Ankylosing spondylitis of cervicothoracic region (HCC) 10/16/2016   TIA (transient ischemic attack) 08/10/2015   Carotid stenosis 08/04/2014   Hyperlipidemia 02/21/2012   BENIGN PROSTATIC HYPERTROPHY, WITH OBSTRUCTION 01/15/2010   Essential hypertension 11/10/2009   GERD 02/22/2008   NEPHROLITHIASIS, HX OF 11/17/2007    ONSET DATE: referral date 12/09/23  REFERRING DIAG: R26.81 (ICD-10-CM) - Unsteadiness on feet R27.8 (ICD-10-CM) - Other lack of coordination R29.3 (ICD-10-CM) - Abnormal posture G20.A2 (ICD-10-CM) - Parkinson's disease without dyskinesia, with fluctuations  THERAPY DIAG:  Other symptoms and signs involving the nervous system  Unsteadiness on feet  Rationale for Evaluation and Treatment: Rehabilitation  SUBJECTIVE:   SUBJECTIVE STATEMENT: Pt reports that they have had a hard time aligning his and trainer's schedules.   Pt accompanied by: self (spouse, [name])  PERTINENT HISTORY:  history of Parkinson's disease, duodenal and esophageal ulcers, ankylosing spondylitis, hypertension and hyperlipidemia  PRECAUTIONS: Fall  WEIGHT BEARING RESTRICTIONS: No  PAIN:  Are you having pain? No  FALLS: Has patient fallen in last 3 months? Yes. Number of falls 5-6 fall; however 2-3 falls where he has needed to have assist to get him up or go to ED  LIVING ENVIRONMENT: Lives with: lives with their spouse Lives in: House/apartment Stairs: Yes: External:  2-3 steps; has a ramp at entrance with raised railings Internal: steps to basement and steps to loft - however pt does not need to go up/down those at this time. Has following equipment at home: [name] - 2 wheeled, Environmental consultant - 4 wheeled, U-step, bed side commode, Shower chair, Ramped entry, and transport chair  PLOF: Requires assistive device for independence and Needs assistance with ADLs  PATIENT GOALS: feel safe to get in to the shower  OBJECTIVE:  Note: Objective measures were completed at Evaluation unless otherwise noted.  HAND DOMINANCE: Right  ADLs: Overall ADLs: Pt reports there are times that he can be fairly independent with dressing but there are times where spouse has to provide significant assistance. Transfers/ambulation related to ADLs: Utilizing U-step for mobility in and outside of the home Eating: fumble fingered UB Dressing: occasional assistance LB Dressing: sock and shoes continue to be difficult Toileting: Pt will occasionally require assistance to get up from toilet Bathing: sponge bathing at sink without assistance from shower chair seated at sink, spouse assisting with washing hair; Setup for washing feet Tub Shower transfers: not currently performing Equipment: Shower seat with back and Walk in shower with 5.5 ledge with 30 door  IADLs: Light housekeeping: washes dishes when he is feeling strong, however frequent onset of weakness Meal Prep: Pt was primary cook prior to fall in 2023, but has returned to participation in aspects of meal prep Community mobility: recently renewed drivers license, but is not driving  MOBILITY STATUS: Needs Assist: CGA with mobility with RW and Hx of falls   POSTURE COMMENTS:  rounded shoulders and forward head, posterior pelvic tilt and R lean  FUNCTIONAL OUTCOME MEASURES: Physical performance test: PPT#2 (simulated eating) 17.41 & PPT#4 (donning/doffing jacket): TBD  COORDINATION: 9 Hole Peg test: Right: 38.44 sec;  Left: 61.81 sec  UE ROM:  all movements are slowed, decreased shoulder flexion d/t ankylosing spondylitis   UE MMT:   grossly 4/5 overall  COGNITION: Overall cognitive status: Spouse reports thinking continues to be slower  OBSERVATIONS: Bradykinesia                                                                                                                    TREATMENT DATE:  03/09/24 Self-care/additional resources: OT providing pt and spouse with social worker contact with Adult nurse Neurology movement disorders program to aid in accessing resources and aid.  OT educating on monthly support group in [facility] facility and recommending attendance and/or reaching out to Child psychotherapist with additional questions or for resources.  OT also provided pt and spouse with handout of local and online resources for PD support and recommendations.   Phone use: Spouse reports that he answered the phone without issue this morning.  Pt reports that use of stylus may help, but that he does not carry one with him at all times.  OT also educated on adaptive phones that have pictures and/or larger buttons to aid in use.      03/02/24 Additional resources: OT assisted spouse with navigating through [facility] and [facility] online resource Find Help.  Still with limited resources for respite care and/or physical assistance for patient due to location in [place], [place]  Pt's spouse reports that she was unable to locate toilet seat riser with attached arm rests in the store but plans to pursue ordering online.    Phone use: pt reports difficulty with use of both cell phone and landline.  Pt frequently with difficulty pushing correct buttons on phones as well as TV remote.  OT educating on use of stylus to aid in precision with pushing buttons.  Engaged in simulated dialing of phone and navigating through spouse's phone with significantly increased ease with use of stylus.  Pt will benefit from trial  with personal cell phone and home phone.      02/24/24 Bathroom modifications: spouse repors plan to order 21 height commode seat but was told by store that they were unable to order that height commode.  OT showing pt and spouse options for elevated toilet seat with and without arm rests to allow for safe attachment to current commode seat.  Pt and spouse voicing concern with a seat that would slip while he is transferring on/off or while seated.  OT voicing concern about seats or rails that have foot pieces that could be a fall hazard.  OT then locating toilet seat riser with attached arm rests that appeared to be the best option for the pt at this time.  OT providing with picture and encouraged pt's spouse to f/u and do her own research to best option. Additional resources: OT educating about [place] and [facility] providing new online resource for Find Help  - to support pt with resources in their zip code for health related needs and resources.   PATIENT EDUCATION: Education details: Adaptive strategies to increase ease/success with phone and TV remote Person educated: Patient and Spouse Education method: Explanation Education comprehension: verbalized understanding and needs further education  HOME EXERCISE PROGRAM: TBD  GOALS: Goals reviewed with patient? Yes  SHORT TERM GOALS: Target date: 02/13/24  Pt and spouse will be independent in full body HEP with focus on large amplitude movements as needed to maintain/preserve function as pertaining to ADLs/IADLs.  Baseline: Goal status: MET  2.  Pt will demonstrate ability to complete sit > stand as needed to get up from toilet or other seating options in home by completing x3 throughout session without cues or physical assistance to maintain/preserve function.  Baseline:  Goal status: MET  3.  Pt will demonstrate improved functional use of BUE and/or adaptive techniques to aid in increased independence with phone use and tv remove  use. Baseline:  Goal status: MET   LONG TERM GOALS: Target date: 03/05/24  Pt and spouse will report understanding of DME vs adaptive techniques/strategies and/or modifications to routines to increase safety with mobility and self-care tasks in the home.  Baseline:  Goal status: MET - has made attempts at modifications to bathroom, has reached out to resources  to aid in home - with no success, therefore provided with [facility] contact through [facility] Neurology movement disorders program   2.  Pt will report ability to complete UB/LB dressing at Supervision/setup level with use of AE and/or alternative strategies PRN.  Baseline:  Goal status: Not met - wife pursuing adaptive clothes with magnetic closures, has not yet purchased  3.  Pt will demonstrate improved independence with stand pivot transfers to carry over to shower transfers with supervision. Baseline:  Goal status: Not met  4.  Pt will demonstrate improved fine motor coordination for ADLs as evidenced by decreasing 9 hole peg test score for LUE by 10 secs Baseline: Right: 38.44 sec; Left: 61.81 sec Goal status: Not assessed  ASSESSMENT:  CLINICAL IMPRESSION: Pt and spouse engaged in conversation about AE/DME to aid in independence and safety with ADLs and functional mobility and transfers.  Pt and spouse receptive to resources for local PD support group and social worker to aid in resources. Due to progressive nature of PD, recommend f/u evaluation in ~3 months to aid in resources and support for ADLs, IADLs, and quality of life.  PERFORMANCE DEFICITS:  in functional skills including ADLs, IADLs, coordination, dexterity, ROM, strength, pain, flexibility, Fine motor control, Gross motor control, balance, body mechanics, endurance, decreased knowledge of precautions, decreased knowledge of use of DME, and UE functional use and psychosocial skills including coping strategies, environmental adaptation, and routines and behaviors.    IMPAIRMENTS: are limiting patient from ADLs and IADLs.     PLAN:  OT FREQUENCY: 1x/week  OT DURATION: 6 weeks  PLANNED INTERVENTIONS: 97168 OT Re-evaluation, 97535 self care/ADL training, 02889 therapeutic exercise, 97530 therapeutic activity, 97112 neuromuscular re-education, 97140 manual therapy, balance training, functional mobility training, psychosocial skills training, energy conservation, coping strategies training, patient/family education, and DME and/or AE instructions  RECOMMENDED OTHER SERVICES: NA  CONSULTED AND AGREED WITH PLAN OF CARE: Patient and family member/caregiver   KAYLENE DOMINO, OTR/L 03/09/2024, 3:21 PM   [facility] at [facility] [address], [address] [place], [place] [address] Phone # [phone] Fax # [phone]

## 2024-03-16 ENCOUNTER — Ambulatory Visit

## 2024-03-16 DIAGNOSIS — R29818 Other symptoms and signs involving the nervous system: Secondary | ICD-10-CM

## 2024-03-16 DIAGNOSIS — M6281 Muscle weakness (generalized): Secondary | ICD-10-CM

## 2024-03-16 DIAGNOSIS — R2689 Other abnormalities of gait and mobility: Secondary | ICD-10-CM

## 2024-03-16 DIAGNOSIS — R2681 Unsteadiness on feet: Secondary | ICD-10-CM

## 2024-03-16 DIAGNOSIS — G20A2 Parkinson's disease without dyskinesia, with fluctuations: Secondary | ICD-10-CM | POA: Diagnosis not present

## 2024-03-16 DIAGNOSIS — R293 Abnormal posture: Secondary | ICD-10-CM

## 2024-03-16 DIAGNOSIS — R262 Difficulty in walking, not elsewhere classified: Secondary | ICD-10-CM

## 2024-03-16 DIAGNOSIS — R278 Other lack of coordination: Secondary | ICD-10-CM | POA: Diagnosis not present

## 2024-03-16 NOTE — Therapy (Signed)
 OUTPATIENT PHYSICAL THERAPY NEURO TREATMENT, Progress Note and Recertification   Patient Name: [name] MRN: [id number] DOB:1936/06/03, 88 y.o., male Today's Date: 03/16/2024   PCP: Yolande Toribio MATSU, MD REFERRING PROVIDER: Buck Saucer, MD  Progress Note Reporting Period 01/20/24 to 03/16/24  See note below for Objective Data and Assessment of Progress/Goals.      END OF SESSION:  PT End of Session - 03/16/24 1448     Visit Number 6    Number of Visits 13    Date for PT Re-Evaluation 05/04/24    Authorization Type Medicare/[employer]    Progress Note Due on Visit 16    PT Start Time 1445    PT Stop Time 1530    PT Time Calculation (min) 45 min          Past Medical History:  Diagnosis Date   Ankylosing spondylitis (HCC) dx'd ~ 1974   Arthritis    back (08/10/2015)   BENIGN PROSTATIC HYPERTROPHY, WITH OBSTRUCTION 01/15/2010   Bleeding duodenal ulcer    Bleeding esophageal ulcer    Bleeding stomach ulcer    GERD 02/22/2008   History of blood transfusion 08/26/1986   lost ~ 1/2 of my blood volume from multiple bleeding ulcers   History of hiatal hernia    HYPERGLYCEMIA 11/18/2007   HYPERLIPIDEMIA 02/22/2008   HYPERTENSION, UNSPECIFIED 11/10/2009   Kidney stones    Osteoarthritis    c-spine   Parkinson's disease (HCC)    PEPTIC ULCER DISEASE 11/17/2007   Situational depression    son died in MVA 07/09/2015   TIA (transient ischemic attack)    not that I know of in the past; they are trying to determine if I've had one today (08/10/2015)   Past Surgical History:  Procedure Laterality Date   CATARACT EXTRACTION W/ INTRAOCULAR LENS  IMPLANT, BILATERAL Bilateral 2013   Patient Active Problem List   Diagnosis Date Noted   Ankylosing spondylitis of cervicothoracic region (HCC) 10/16/2016   TIA (transient ischemic attack) 08/10/2015   Carotid stenosis 08/04/2014   Hyperlipidemia 02/21/2012   BENIGN PROSTATIC HYPERTROPHY, WITH OBSTRUCTION 01/15/2010    Essential hypertension 11/10/2009   GERD 02/22/2008   NEPHROLITHIASIS, HX OF 11/17/2007    ONSET DATE: PD x years onset  REFERRING DIAG: R26.81 (ICD-10-CM) - Unsteadiness on feet R29.3 (ICD-10-CM) - Abnormal posture R27.8 (ICD-10-CM) - Other lack of coordination G20.A2 (ICD-10-CM) - Parkinson's disease with fluctuating manifestations, unspecified whether dyskinesia present (HCC)  THERAPY DIAG:  Other symptoms and signs involving the nervous system  Muscle weakness (generalized)  Unsteadiness on feet  Abnormal posture  Parkinson's disease with fluctuating manifestations, unspecified whether dyskinesia present (HCC)  Other abnormalities of gait and mobility  Difficulty in walking, not elsewhere classified  Rationale for Evaluation and Treatment: Rehabilitation  SUBJECTIVE:  SUBJECTIVE STATEMENT: Been doing better with homework  Pt accompanied by: significant other  PERTINENT HISTORY: complex medical history of peptic ulcer disease with history of upper GI bleed, TIA, depression, degenerative disc disease in the neck, osteoarthritis, kidney stones, hyperlipidemia, hypertension, history of hiatal hernia, BPH, arthritis, and ankylosing spondylitis, and parkinsonism  PAIN:  Are you having pain? No worse for chronic pain, no acute pain  PRECAUTIONS: Fall  RED FLAGS: None   WEIGHT BEARING RESTRICTIONS: No  FALLS: Has patient fallen in last 3 months? Unsure as to number, notes fall/LOB every other week  LIVING ENVIRONMENT: Lives with: lives with their spouse Lives in: House/apartment Stairs: Ramp to enter, stairs to loft and basement, ground floor set-up Has following equipment at home: [name] - 2 wheeled  PLOF: Needs assistance with ADLs, Needs assistance with homemaking, and Needs  assistance with gait  PATIENT GOALS:   OBJECTIVE:   TODAY'S TREATMENT: 03/16/24 Activity Comments  Berg Balance Test 27/56  TUG test 1 min 30 sec w/ U-step  180 deg turn w/ U-step 20-22 steps  Transfers: SBA-min A   Gait: SBA-min A         [facility] PT Assessment - 03/16/24 0001       Berg Balance Test   Sit to Stand Able to stand using hands after several tries    Standing Unsupported Unable to stand 30 seconds unassisted    Sitting with Back Unsupported but Feet Supported on Floor or Stool Able to sit safely and securely 2 minutes    Stand to Sit Controls descent by using hands    Transfers Able to transfer safely, definite need of hands    Standing Unsupported with Eyes Closed Able to stand 3 seconds    Standing Unsupported with Feet Together Able to place feet together independently and stand for 1 minute with supervision    From Standing, Reach Forward with Outstretched Arm Can reach forward >12 cm safely (5)    From Standing Position, Pick up Object from Floor Able to pick up shoe, needs supervision    From Standing Position, Turn to Look Behind Over each Shoulder Turn sideways only but maintains balance    Turn 360 Degrees Needs assistance while turning    Standing Unsupported, Alternately Place Feet on Step/Stool Able to complete >2 steps/needs minimal assist    Standing Unsupported, One Foot in [employer] balance while stepping or standing    Standing on One Leg Tries to lift leg/unable to hold 3 seconds but remains standing independently    Total Score 27           TODAY'S TREATMENT: 03/09/24 Activity Comments  Transfer training -supervision sit to stand and stand-pivot w/ RW--cues for UE placement/sequence -  NU-step level 5 x 8 min Steady state 70-80 SPM  Gait training RW w/ 3# ankle weights for added weight and supervision-SBA level surfaces w/ trials for faster pace/greater stride length and cues for negotiating turns w/ various strategies  Kicking physioball x 1  min Single arm row x 10 reps 15# 3 round circuit  Sit to stand 5x w/ HR Seated lat row 10x 20# 3 round circuit              Note: Objective measures were completed at Evaluation unless otherwise noted.  DIAGNOSTIC FINDINGS:   COGNITION: Overall cognitive status: Within functional limits for tasks assessed and some instances of difficulty attending to task/sequencing   SENSATION: Not tested  COORDINATION: Deficits to rapid alternating movements  MUSCLE TONE: rigidity  MUSCLE LENGTH: 10-15 degree knee flexion contracture bilat   POSTURE: rounded shoulders, forward head, and increased thoracic kyphosis  LOWER EXTREMITY ROM:     Right ankle ROM limits from fusion  LOWER EXTREMITY MMT:    4/5 gross BLE  BED MOBILITY:  Findings: NT, reports fluctuating performance  TRANSFERS: Sit to stand: Modified independence and CGA  Assistive device utilized: U-Step     Stand to sit: CGA  Assistive device utilized: u-step     Chair to chair: Modified independence and CGA  Assistive device utilized: U-step         CURB:  Findings: CGA-min A  STAIRS: Findings: Comments: CGA-min A GAIT: Findings: Distance walked: 150, Assistive device utilized:U-step, Level of assistance: CGA, and Comments: freezing in turns  FUNCTIONAL TESTS:  5 times sit to stand: 47.25 sec Timed up and go (TUG): 36 sec w/ U-step 10 meter walk test: 23.34 sec = 1.4 ft/sec w/ U-step Berg Balance Scale:  180 degree turns: 16-18 steps  Item Test date: 01/20/24 Test date:  Test date:   Sitting to standing 2. able to stand using hands after several tries Insert OPRCBERGREEVAL SmartPhrase at re-test date Insert OPRCBERGREEVAL SmartPhrase at re-test date  2. Standing unsupported 3. able to stand 2 minutes with supervision    3. Sitting with back unsupported, feet supported 4. able to sit safely and securely for 2 minutes    4. Standing to sitting 2. uses back of legs against chair to control descent     5. Pivot transfer  2. able to transfer with verbal cuing and/or supervision    6. Standing unsupported with eyes closed 3. able to stand 10 seconds with supervision    7. Standing unsupported with feet together 3. able to place feet together independently and stand 1 minute with supervision    8. Reaching forward with outstretched arms while standing 3. can reach forward 12 cm (5 inches)    9. Pick up object from the floor from standing 0. unable to try/needs assistance to keep from losing balance or falling    10. Turning to look behind over left and right shoulders while standing 2. turns sideways but only maintains balance    11. Turn 360 degrees 0. needs assistance while turning    12. Place alternate foot on step or stool while standing unsupported 1. able to complete > 2 steps needs minimal assist    13. Standing unsupported one foot in front 0. loses balance while stepping or standing    14. Standing on one leg 0. unable to try of needs assist to prevent fall      Total Score 25/56 Total Score /56 Total Score /56     PATIENT SURVEYS:  Freezing of gait questionnaire provided to fill out and return  TREATMENT DATE:     PATIENT EDUCATION:a Education details: assessment details, rationale of tx interventions Person educated: Patient and Spouse Education method: Explanation Education comprehension: verbalized understanding  HOME EXERCISE PROGRAM: TBD  GOALS: Goals reviewed with patient? Yes  SHORT TERM GOALS: Target date: 02/17/2024    Patient will perform HEP with family/caregiver supervision for improved strength, balance, transfers, and gait  Baseline: Goal status: MET  2.  Demo improved postural control and BLE strength per time 35 sec 5xSTS test Baseline: 47 sec; 37 sec Goal status: NOT MET    LONG TERM GOALS: Target date: 05/04/24    Demo  improved static strength and safety with ADL per score 40/56 Berg Balance Test Baseline: 25/56; 27/56 Goal status: IN PROGRESS 03/16/24  2.  Demo improved safety/efficiency of turning negotiating 180 degree turn in 10-12 steps Baseline: 16-18 w/ U-step; 20-22 w/ U-step Goal status: IN PROGRESS  3.  Demo improved safety with mobility per time 25 sec TUG test Baseline: 36 sec w/ U-step; 1 min 30 sec w/ U-step Goal status: IN PROGRESS  4.  Spouse to teach-back strategies to help with reducing freezing of gait Baseline:  Goal status: IMET   ASSESSMENT:  CLINICAL IMPRESSION: Berg Balance Test 27/56 from 25/56 at outset. Continuing to experience significant freezing of gait and postural instability w/ frequent LOB when standing requiring min-mod A to correct and recover. Difficulty with sequencing requiring multi-modal cues from caregiver/therapist for safety and sequence for transfers and ambulation. Review of strategies for sequence and instruction to pt and spouse in verbal, visual, tactile cue strategies to improve safety with mobility.  Pt would benefit from ongoing services to facilitate further strategies and activities that can be safely performed at home to maintain current level of function.   OBJECTIVE IMPAIRMENTS: Abnormal gait, decreased activity tolerance, decreased balance, decreased coordination, decreased mobility, difficulty walking, decreased strength, improper body mechanics, and postural dysfunction.   ACTIVITY LIMITATIONS: carrying, lifting, bending, standing, squatting, stairs, transfers, bed mobility, reach over head, and locomotion level  PARTICIPATION LIMITATIONS: meal prep, cleaning, shopping, community activity, and ADL  PERSONAL FACTORS: Age, Time since onset of injury/illness/exacerbation, and 3+ comorbidities: PMH are also affecting patient's functional outcome.   REHAB POTENTIAL: Good  CLINICAL DECISION MAKING: Unstable/unpredictable  EVALUATION COMPLEXITY:  High  PLAN:  PT FREQUENCY: 1x/week  PT DURATION: 8 weeks  PLANNED INTERVENTIONS: 97750- Physical Performance Testing, 97110-Therapeutic exercises, 97530- Therapeutic activity, [id number]- Neuromuscular re-education, 97535- Self Care, [id number]- Manual therapy, [id number]- Gait training, and [phone]- Aquatic Therapy  PLAN FOR NEXT SESSION: freezing of gait questionnaire, clock-face turns for FoG? (Trying to come up with activities that spouse can practice for turns/freezing of gait)   5:19 PM, 03/16/24 M. Kelly Alpa Salvo, PT, DPT Physical Therapist- [place] Office Number: [phone]

## 2024-03-17 DIAGNOSIS — M459 Ankylosing spondylitis of unspecified sites in spine: Secondary | ICD-10-CM | POA: Diagnosis not present

## 2024-03-23 ENCOUNTER — Ambulatory Visit

## 2024-03-23 DIAGNOSIS — G20A2 Parkinson's disease without dyskinesia, with fluctuations: Secondary | ICD-10-CM

## 2024-03-23 DIAGNOSIS — M6281 Muscle weakness (generalized): Secondary | ICD-10-CM

## 2024-03-23 DIAGNOSIS — R293 Abnormal posture: Secondary | ICD-10-CM

## 2024-03-23 DIAGNOSIS — R29818 Other symptoms and signs involving the nervous system: Secondary | ICD-10-CM

## 2024-03-23 DIAGNOSIS — R2681 Unsteadiness on feet: Secondary | ICD-10-CM

## 2024-03-23 DIAGNOSIS — R2689 Other abnormalities of gait and mobility: Secondary | ICD-10-CM

## 2024-03-23 DIAGNOSIS — R262 Difficulty in walking, not elsewhere classified: Secondary | ICD-10-CM

## 2024-03-23 DIAGNOSIS — R278 Other lack of coordination: Secondary | ICD-10-CM | POA: Diagnosis not present

## 2024-03-23 NOTE — Therapy (Signed)
 OUTPATIENT PHYSICAL THERAPY NEURO TREATMENT  Patient Name: [name] MRN: [id number] DOB:11-19-1935, 88 y.o., male Today's Date: 03/23/2024   PCP: Yolande Toribio MATSU, MD REFERRING PROVIDER: Buck Saucer, MD      END OF SESSION:  PT End of Session - 03/23/24 1441     Visit Number 7    Number of Visits 13    Date for PT Re-Evaluation 05/04/24    Authorization Type Medicare/AARP    Progress Note Due on Visit 16    PT Start Time 1435    PT Stop Time 1530    PT Time Calculation (min) 55 min          Past Medical History:  Diagnosis Date   Ankylosing spondylitis (HCC) dx'd ~ 1974   Arthritis    back (08/10/2015)   BENIGN PROSTATIC HYPERTROPHY, WITH OBSTRUCTION 01/15/2010   Bleeding duodenal ulcer    Bleeding esophageal ulcer    Bleeding stomach ulcer    GERD 02/22/2008   History of blood transfusion 08/26/1986   lost ~ 1/2 of my blood volume from multiple bleeding ulcers   History of hiatal hernia    HYPERGLYCEMIA 11/18/2007   HYPERLIPIDEMIA 02/22/2008   HYPERTENSION, UNSPECIFIED 11/10/2009   Kidney stones    Osteoarthritis    c-spine   Parkinson's disease (HCC)    PEPTIC ULCER DISEASE 11/17/2007   Situational depression    son died in MVA 2015-06-07   TIA (transient ischemic attack)    not that I know of in the past; they are trying to determine if I've had one today (08/10/2015)   Past Surgical History:  Procedure Laterality Date   CATARACT EXTRACTION W/ INTRAOCULAR LENS  IMPLANT, BILATERAL Bilateral 2013   Patient Active Problem List   Diagnosis Date Noted   Ankylosing spondylitis of cervicothoracic region (HCC) 10/16/2016   TIA (transient ischemic attack) 08/10/2015   Carotid stenosis 08/04/2014   Hyperlipidemia 02/21/2012   BENIGN PROSTATIC HYPERTROPHY, WITH OBSTRUCTION 01/15/2010   Essential hypertension 11/10/2009   GERD 02/22/2008   NEPHROLITHIASIS, HX OF 11/17/2007    ONSET DATE: PD x years onset  REFERRING DIAG: R26.81 (ICD-10-CM) -  Unsteadiness on feet R29.3 (ICD-10-CM) - Abnormal posture R27.8 (ICD-10-CM) - Other lack of coordination G20.A2 (ICD-10-CM) - Parkinson's disease with fluctuating manifestations, unspecified whether dyskinesia present (HCC)  THERAPY DIAG:  Other symptoms and signs involving the nervous system  Muscle weakness (generalized)  Unsteadiness on feet  Abnormal posture  Parkinson's disease with fluctuating manifestations, unspecified whether dyskinesia present (HCC)  Other abnormalities of gait and mobility  Difficulty in walking, not elsewhere classified  Rationale for Evaluation and Treatment: Rehabilitation  SUBJECTIVE:  SUBJECTIVE STATEMENT: Continuing to practice turns at home. Will be going to gym tomorrow  Pt accompanied by: significant other  PERTINENT HISTORY: complex medical history of peptic ulcer disease with history of upper GI bleed, TIA, depression, degenerative disc disease in the neck, osteoarthritis, kidney stones, hyperlipidemia, hypertension, history of hiatal hernia, BPH, arthritis, and ankylosing spondylitis, and parkinsonism  PAIN:  Are you having pain? No worse for chronic pain, no acute pain  PRECAUTIONS: Fall  RED FLAGS: None   WEIGHT BEARING RESTRICTIONS: No  FALLS: Has patient fallen in last 3 months? Unsure as to number, notes fall/LOB every other week  LIVING ENVIRONMENT: Lives with: lives with their spouse Lives in: House/apartment Stairs: Ramp to enter, stairs to loft and basement, ground floor set-up Has following equipment at home: [name] - 2 wheeled  PLOF: Needs assistance with ADLs, Needs assistance with homemaking, and Needs assistance with gait  PATIENT GOALS:   OBJECTIVE:   TODAY'S TREATMENT: 03/23/24 Activity Comments  NU-step level 5 x 8 min  Steady state 70-90 SPM, short steps for neural priming  Pre-gait/gait training -weight shift: lateral/ant-post -step height targets for visual cues -U-step w/ visual destination targets and counted steps -counted steps for 180 deg turns -resisted gait--difficulty and causing shorter step length -gait for speed w/ laser for step length -sit to stand w/ step initiation via visual cue                  TODAY'S TREATMENT: 03/16/24 Activity Comments  Berg Balance Test 27/56  TUG test 1 min 30 sec w/ U-step  180 deg turn w/ U-step 20-22 steps  Transfers: SBA-min A   Gait: SBA-min A                Note: Objective measures were completed at Evaluation unless otherwise noted.  DIAGNOSTIC FINDINGS:   COGNITION: Overall cognitive status: Within functional limits for tasks assessed and some instances of difficulty attending to task/sequencing   SENSATION: Not tested  COORDINATION: Deficits to rapid alternating movements    MUSCLE TONE: rigidity  MUSCLE LENGTH: 10-15 degree knee flexion contracture bilat   POSTURE: rounded shoulders, forward head, and increased thoracic kyphosis  LOWER EXTREMITY ROM:     Right ankle ROM limits from fusion  LOWER EXTREMITY MMT:    4/5 gross BLE  BED MOBILITY:  Findings: NT, reports fluctuating performance  TRANSFERS: Sit to stand: Modified independence and CGA  Assistive device utilized: U-Step     Stand to sit: CGA  Assistive device utilized: u-step     Chair to chair: Modified independence and CGA  Assistive device utilized: U-step         CURB:  Findings: CGA-min A  STAIRS: Findings: Comments: CGA-min A GAIT: Findings: Distance walked: 150, Assistive device utilized:U-step, Level of assistance: CGA, and Comments: freezing in turns  FUNCTIONAL TESTS:  5 times sit to stand: 47.25 sec Timed up and go (TUG): 36 sec w/ U-step 10 meter walk test: 23.34 sec = 1.4 ft/sec w/ U-step Berg Balance Scale:  180 degree turns:  16-18 steps  Item Test date: 01/20/24 Test date:  Test date:   Sitting to standing 2. able to stand using hands after several tries Insert OPRCBERGREEVAL SmartPhrase at re-test date Insert OPRCBERGREEVAL SmartPhrase at re-test date  2. Standing unsupported 3. able to stand 2 minutes with supervision    3. Sitting with back unsupported, feet supported 4. able to sit safely and securely for 2 minutes    4. Standing to  sitting 2. uses back of legs against chair to control descent    5. Pivot transfer  2. able to transfer with verbal cuing and/or supervision    6. Standing unsupported with eyes closed 3. able to stand 10 seconds with supervision    7. Standing unsupported with feet together 3. able to place feet together independently and stand 1 minute with supervision    8. Reaching forward with outstretched arms while standing 3. can reach forward 12 cm (5 inches)    9. Pick up object from the floor from standing 0. unable to try/needs assistance to keep from losing balance or falling    10. Turning to look behind over left and right shoulders while standing 2. turns sideways but only maintains balance    11. Turn 360 degrees 0. needs assistance while turning    12. Place alternate foot on step or stool while standing unsupported 1. able to complete > 2 steps needs minimal assist    13. Standing unsupported one foot in front 0. loses balance while stepping or standing    14. Standing on one leg 0. unable to try of needs assist to prevent fall      Total Score 25/56 Total Score /56 Total Score /56     PATIENT SURVEYS:  Freezing of gait questionnaire provided to fill out and return                                                                                                                              TREATMENT DATE:     PATIENT EDUCATION:a Education details: assessment details, rationale of tx interventions Person educated: Patient and Spouse Education method: Explanation Education  comprehension: verbalized understanding  HOME EXERCISE PROGRAM: TBD  GOALS: Goals reviewed with patient? Yes  SHORT TERM GOALS: Target date: 02/17/2024    Patient will perform HEP with family/caregiver supervision for improved strength, balance, transfers, and gait  Baseline: Goal status: MET  2.  Demo improved postural control and BLE strength per time 35 sec 5xSTS test Baseline: 47 sec; 37 sec Goal status: NOT MET    LONG TERM GOALS: Target date: 05/04/24    Demo improved static strength and safety with ADL per score 40/56 Berg Balance Test Baseline: 25/56; 27/56 Goal status: IN PROGRESS 03/16/24  2.  Demo improved safety/efficiency of turning negotiating 180 degree turn in 10-12 steps Baseline: 16-18 w/ U-step; 20-22 w/ U-step Goal status: IN PROGRESS  3.  Demo improved safety with mobility per time 25 sec TUG test Baseline: 36 sec w/ U-step; 1 min 30 sec w/ U-step Goal status: IN PROGRESS  4.  Spouse to teach-back strategies to help with reducing freezing of gait Baseline:  Goal status: IMET   ASSESSMENT:  CLINICAL IMPRESSION: NU-step for neural priming to facilitate rapid alternating movement. Pre-gait activities to promote large amplitude step height and length with weight shifting strategies for single limb support. Reinforced with gait training w/ tactics  of visual, auditory, and counted steps to facilitate stride length and gait speed w/ SBA-CGA needed due to unsteadiness and difficulty with multi-step directions requiring cues for maintenance of task and direction.  Continued sessions to progress POC details to improve mobility, reduce risk for falls, and caregiver training for carryover.   OBJECTIVE IMPAIRMENTS: Abnormal gait, decreased activity tolerance, decreased balance, decreased coordination, decreased mobility, difficulty walking, decreased strength, improper body mechanics, and postural dysfunction.   ACTIVITY LIMITATIONS: carrying, lifting, bending,  standing, squatting, stairs, transfers, bed mobility, reach over head, and locomotion level  PARTICIPATION LIMITATIONS: meal prep, cleaning, shopping, community activity, and ADL  PERSONAL FACTORS: Age, Time since onset of injury/illness/exacerbation, and 3+ comorbidities: PMH are also affecting patient's functional outcome.   REHAB POTENTIAL: Good  CLINICAL DECISION MAKING: Unstable/unpredictable  EVALUATION COMPLEXITY: High  PLAN:  PT FREQUENCY: 1x/week  PT DURATION: 8 weeks  PLANNED INTERVENTIONS: 97750- Physical Performance Testing, 97110-Therapeutic exercises, 97530- Therapeutic activity, [id number]- Neuromuscular re-education, 97535- Self Care, 02859- Manual therapy, [id number]- Gait training, and [phone]- Aquatic Therapy  PLAN FOR NEXT SESSION: auditory cues for gait for outdoors ambulation to enable walking at park   2:42 PM, 03/23/24 M. Kelly Kala Ambriz, PT, DPT Physical Therapist- [place] Office Number: [phone]

## 2024-03-31 ENCOUNTER — Ambulatory Visit: Attending: Neurology | Admitting: Physical Therapy

## 2024-03-31 ENCOUNTER — Encounter: Payer: Self-pay | Admitting: Physical Therapy

## 2024-03-31 DIAGNOSIS — R262 Difficulty in walking, not elsewhere classified: Secondary | ICD-10-CM | POA: Diagnosis not present

## 2024-03-31 DIAGNOSIS — M6281 Muscle weakness (generalized): Secondary | ICD-10-CM | POA: Diagnosis not present

## 2024-03-31 DIAGNOSIS — R278 Other lack of coordination: Secondary | ICD-10-CM | POA: Diagnosis not present

## 2024-03-31 DIAGNOSIS — R2689 Other abnormalities of gait and mobility: Secondary | ICD-10-CM | POA: Insufficient documentation

## 2024-03-31 DIAGNOSIS — R29818 Other symptoms and signs involving the nervous system: Secondary | ICD-10-CM | POA: Diagnosis not present

## 2024-03-31 DIAGNOSIS — R293 Abnormal posture: Secondary | ICD-10-CM | POA: Insufficient documentation

## 2024-03-31 DIAGNOSIS — R2681 Unsteadiness on feet: Secondary | ICD-10-CM | POA: Diagnosis not present

## 2024-03-31 DIAGNOSIS — G20A2 Parkinson's disease without dyskinesia, with fluctuations: Secondary | ICD-10-CM | POA: Diagnosis not present

## 2024-03-31 NOTE — Therapy (Signed)
 OUTPATIENT PHYSICAL THERAPY NEURO TREATMENT  Patient Name: [name] MRN: [id number] DOB:[birth year], 88 y.o., male Today's Date: 03/31/2024   PCP: Yolande Toribio MATSU, MD REFERRING PROVIDER: Buck Saucer, MD      END OF SESSION:  PT End of Session - 03/31/24 1532     Visit Number 8    Number of Visits 13    Date for PT Re-Evaluation 05/04/24    Authorization Type Medicare/[employer]    Progress Note Due on Visit 16    PT Start Time 1532    PT Stop Time 1610    PT Time Calculation (min) 38 min           Past Medical History:  Diagnosis Date   Ankylosing spondylitis (HCC) dx'd ~ 1974   Arthritis    back (08/10/2015)   BENIGN PROSTATIC HYPERTROPHY, WITH OBSTRUCTION 01/15/2010   Bleeding duodenal ulcer    Bleeding esophageal ulcer    Bleeding stomach ulcer    GERD 02/22/2008   History of blood transfusion 08/26/1986   lost ~ 1/2 of my blood volume from multiple bleeding ulcers   History of hiatal hernia    HYPERGLYCEMIA 11/18/2007   HYPERLIPIDEMIA 02/22/2008   HYPERTENSION, UNSPECIFIED 11/10/2009   Kidney stones    Osteoarthritis    c-spine   Parkinson's disease (HCC)    PEPTIC ULCER DISEASE 11/17/2007   Situational depression    son died in MVA 11-Jun-2015   TIA (transient ischemic attack)    not that I know of in the past; they are trying to determine if I've had one today (08/10/2015)   Past Surgical History:  Procedure Laterality Date   CATARACT EXTRACTION W/ INTRAOCULAR LENS  IMPLANT, BILATERAL Bilateral 2013   Patient Active Problem List   Diagnosis Date Noted   Ankylosing spondylitis of cervicothoracic region (HCC) 10/16/2016   TIA (transient ischemic attack) 08/10/2015   Carotid stenosis 08/04/2014   Hyperlipidemia 02/21/2012   BENIGN PROSTATIC HYPERTROPHY, WITH OBSTRUCTION 01/15/2010   Essential hypertension 11/10/2009   GERD 02/22/2008   NEPHROLITHIASIS, HX OF 11/17/2007    ONSET DATE: PD x years onset  REFERRING DIAG: R26.81 (ICD-10-CM)  - Unsteadiness on feet R29.3 (ICD-10-CM) - Abnormal posture R27.8 (ICD-10-CM) - Other lack of coordination G20.A2 (ICD-10-CM) - Parkinson's disease with fluctuating manifestations, unspecified whether dyskinesia present (HCC)  THERAPY DIAG:  Other symptoms and signs involving the nervous system  Muscle weakness (generalized)  Unsteadiness on feet  Abnormal posture  Parkinson's disease with fluctuating manifestations, unspecified whether dyskinesia present (HCC)  Other abnormalities of gait and mobility  Difficulty in walking, not elsewhere classified  Other lack of coordination  Rationale for Evaluation and Treatment: Rehabilitation  SUBJECTIVE:  SUBJECTIVE STATEMENT: Pt states he went to the gym.   Pt accompanied by: significant other  PERTINENT HISTORY: complex medical history of peptic ulcer disease with history of upper GI bleed, TIA, depression, degenerative disc disease in the neck, osteoarthritis, kidney stones, hyperlipidemia, hypertension, history of hiatal hernia, BPH, arthritis, and ankylosing spondylitis, and parkinsonism  PAIN:  Are you having pain? No worse for chronic pain, no acute pain  PRECAUTIONS: Fall  RED FLAGS: None   WEIGHT BEARING RESTRICTIONS: No  FALLS: Has patient fallen in last 3 months? Unsure as to number, notes fall/LOB every other week  LIVING ENVIRONMENT: Lives with: lives with their spouse Lives in: House/apartment Stairs: Ramp to enter, stairs to loft and basement, ground floor set-up Has following equipment at home: [name] - 2 wheeled  PLOF: Needs assistance with ADLs, Needs assistance with homemaking, and Needs assistance with gait  PATIENT GOALS:   OBJECTIVE:  TODAY'S TREATMENT: 03/31/24 Activity Comments  NU-step level 6 x 8 min Steady state  >80 SPM  Pre-gait/gait training -weight shift: lateral/ant-post -step height targets for visual cues -U-step w/ visual destination targets and counted steps -counted steps for 180 deg turns -resisted gait--difficulty and causing shorter step length -gait for speed w/ laser for step length -sit to stand w/ step initiation via visual cue   Standing in // bars: Squat touch into chair x10 Sliding hands back on // bars x10 Modified PWR! Rock x10 Modified PWR! Twist x10 Standing big step forward to different colored dots cued for L and R foot Bilat UE support on // bars  Amb with RW Cues for big steps and visual cueing with RTB across the front of RW         TODAY'S TREATMENT: 03/23/24 Activity Comments  NU-step level 5 x 8 min Steady state 70-90 SPM, short steps for neural priming  Pre-gait/gait training -weight shift: lateral/ant-post -step height targets for visual cues -U-step w/ visual destination targets and counted steps -counted steps for 180 deg turns -resisted gait--difficulty and causing shorter step length -gait for speed w/ laser for step length -sit to stand w/ step initiation via visual cue                  TODAY'S TREATMENT: 03/16/24 Activity Comments  Berg Balance Test 27/56  TUG test 1 min 30 sec w/ U-step  180 deg turn w/ U-step 20-22 steps  Transfers: SBA-min A   Gait: SBA-min A                Note: Objective measures were completed at Evaluation unless otherwise noted.  DIAGNOSTIC FINDINGS:   COGNITION: Overall cognitive status: Within functional limits for tasks assessed and some instances of difficulty attending to task/sequencing   SENSATION: Not tested  COORDINATION: Deficits to rapid alternating movements    MUSCLE TONE: rigidity  MUSCLE LENGTH: 10-15 degree knee flexion contracture bilat   POSTURE: rounded shoulders, forward head, and increased thoracic kyphosis  LOWER EXTREMITY ROM:     Right ankle ROM limits from  fusion  LOWER EXTREMITY MMT:    4/5 gross BLE  BED MOBILITY:  Findings: NT, reports fluctuating performance  TRANSFERS: Sit to stand: Modified independence and CGA  Assistive device utilized: U-Step     Stand to sit: CGA  Assistive device utilized: u-step     Chair to chair: Modified independence and CGA  Assistive device utilized: U-step         CURB:  Findings: CGA-min A  STAIRS: Findings: Comments:  CGA-min A GAIT: Findings: Distance walked: 150, Assistive device utilized:U-step, Level of assistance: CGA, and Comments: freezing in turns  FUNCTIONAL TESTS:  5 times sit to stand: 47.25 sec Timed up and go (TUG): 36 sec w/ U-step 10 meter walk test: 23.34 sec = 1.4 ft/sec w/ U-step Lars Balance Scale:  180 degree turns: 16-18 steps  Item Test date: 01/20/24 Test date:  Test date:   Sitting to standing 2. able to stand using hands after several tries Insert OPRCBERGREEVAL SmartPhrase at re-test date Insert OPRCBERGREEVAL SmartPhrase at re-test date  2. Standing unsupported 3. able to stand 2 minutes with supervision    3. Sitting with back unsupported, feet supported 4. able to sit safely and securely for 2 minutes    4. Standing to sitting 2. uses back of legs against chair to control descent    5. Pivot transfer  2. able to transfer with verbal cuing and/or supervision    6. Standing unsupported with eyes closed 3. able to stand 10 seconds with supervision    7. Standing unsupported with feet together 3. able to place feet together independently and stand 1 minute with supervision    8. Reaching forward with outstretched arms while standing 3. can reach forward 12 cm (5 inches)    9. Pick up object from the floor from standing 0. unable to try/needs assistance to keep from losing balance or falling    10. Turning to look behind over left and right shoulders while standing 2. turns sideways but only maintains balance    11. Turn 360 degrees 0. needs assistance while turning     12. Place alternate foot on step or stool while standing unsupported 1. able to complete > 2 steps needs minimal assist    13. Standing unsupported one foot in front 0. loses balance while stepping or standing    14. Standing on one leg 0. unable to try of needs assist to prevent fall      Total Score 25/56 Total Score /56 Total Score /56     PATIENT SURVEYS:  Freezing of gait questionnaire provided to fill out and return                                                                                                                              TREATMENT DATE:     PATIENT EDUCATION:a Education details: assessment details, rationale of tx interventions Person educated: Patient and Spouse Education method: Explanation Education comprehension: verbalized understanding  HOME EXERCISE PROGRAM: TBD  GOALS: Goals reviewed with patient? Yes  SHORT TERM GOALS: Target date: 02/17/2024    Patient will perform HEP with family/caregiver supervision for improved strength, balance, transfers, and gait  Baseline: Goal status: MET  2.  Demo improved postural control and BLE strength per time 35 sec 5xSTS test Baseline: 47 sec; 37 sec Goal status: NOT MET    LONG TERM GOALS: Target date: 05/04/24    Demo improved  static strength and safety with ADL per score 40/56 Berg Balance Test Baseline: 25/56; 27/56 Goal status: IN PROGRESS 03/16/24  2.  Demo improved safety/efficiency of turning negotiating 180 degree turn in 10-12 steps Baseline: 16-18 w/ U-step; 20-22 w/ U-step Goal status: IN PROGRESS  3.  Demo improved safety with mobility per time 25 sec TUG test Baseline: 36 sec w/ U-step; 1 min 30 sec w/ U-step Goal status: IN PROGRESS  4.  Spouse to teach-back strategies to help with reducing freezing of gait Baseline:  Goal status: IMET   ASSESSMENT:  CLINICAL IMPRESSION: Worked on improving thoracic mobility and trunk extension this session to improve overall rigidity/weight  shifting with modified PWR! movements. Worked on balance, stepping and coordination using colored dots. Continued to focus on increasing step length with amb and improving turns.    OBJECTIVE IMPAIRMENTS: Abnormal gait, decreased activity tolerance, decreased balance, decreased coordination, decreased mobility, difficulty walking, decreased strength, improper body mechanics, and postural dysfunction.   ACTIVITY LIMITATIONS: carrying, lifting, bending, standing, squatting, stairs, transfers, bed mobility, reach over head, and locomotion level  PARTICIPATION LIMITATIONS: meal prep, cleaning, shopping, community activity, and ADL  PERSONAL FACTORS: Age, Time since onset of injury/illness/exacerbation, and 3+ comorbidities: PMH are also affecting patient's functional outcome.   REHAB POTENTIAL: Good  CLINICAL DECISION MAKING: Unstable/unpredictable  EVALUATION COMPLEXITY: High  PLAN:  PT FREQUENCY: 1x/week  PT DURATION: 8 weeks  PLANNED INTERVENTIONS: 97750- Physical Performance Testing, 97110-Therapeutic exercises, 97530- Therapeutic activity, [id number]- Neuromuscular re-education, 97535- Self Care, [id number]- Manual therapy, [id number]- Gait training, and [phone]- Aquatic Therapy  PLAN FOR NEXT SESSION: auditory cues for gait for outdoors ambulation to enable walking at park   3:32 PM, 03/31/24 Samier Jaco April Ma L Almer Bushey, PT, DPT Physical Therapist- [place] Office Number: [phone]

## 2024-04-02 ENCOUNTER — Emergency Department (HOSPITAL_COMMUNITY)
Admission: EM | Admit: 2024-04-02 | Discharge: 2024-04-03 | Disposition: A | Discharge status: Home / Self Care | Attending: Emergency Medicine | Admitting: Emergency Medicine

## 2024-04-02 ENCOUNTER — Encounter (HOSPITAL_COMMUNITY): Payer: Self-pay

## 2024-04-02 ENCOUNTER — Emergency Department (HOSPITAL_COMMUNITY)

## 2024-04-02 ENCOUNTER — Other Ambulatory Visit: Payer: Self-pay

## 2024-04-02 DIAGNOSIS — I10 Essential (primary) hypertension: Secondary | ICD-10-CM | POA: Insufficient documentation

## 2024-04-02 DIAGNOSIS — Z7982 Long term (current) use of aspirin: Secondary | ICD-10-CM | POA: Diagnosis not present

## 2024-04-02 DIAGNOSIS — R600 Localized edema: Secondary | ICD-10-CM | POA: Insufficient documentation

## 2024-04-02 DIAGNOSIS — Y92019 Unspecified place in single-family (private) house as the place of occurrence of the external cause: Secondary | ICD-10-CM | POA: Insufficient documentation

## 2024-04-02 DIAGNOSIS — S0101XA Laceration without foreign body of scalp, initial encounter: Secondary | ICD-10-CM | POA: Insufficient documentation

## 2024-04-02 DIAGNOSIS — S0990XA Unspecified injury of head, initial encounter: Secondary | ICD-10-CM | POA: Diagnosis not present

## 2024-04-02 DIAGNOSIS — I6523 Occlusion and stenosis of bilateral carotid arteries: Secondary | ICD-10-CM | POA: Diagnosis not present

## 2024-04-02 DIAGNOSIS — W19XXXA Unspecified fall, initial encounter: Secondary | ICD-10-CM | POA: Insufficient documentation

## 2024-04-02 DIAGNOSIS — G20C Parkinsonism, unspecified: Secondary | ICD-10-CM | POA: Diagnosis not present

## 2024-04-02 DIAGNOSIS — Z79899 Other long term (current) drug therapy: Secondary | ICD-10-CM | POA: Diagnosis not present

## 2024-04-02 DIAGNOSIS — S199XXA Unspecified injury of neck, initial encounter: Secondary | ICD-10-CM | POA: Diagnosis not present

## 2024-04-02 DIAGNOSIS — Y92009 Unspecified place in unspecified non-institutional (private) residence as the place of occurrence of the external cause: Secondary | ICD-10-CM

## 2024-04-02 DIAGNOSIS — T07XXXA Unspecified multiple injuries, initial encounter: Secondary | ICD-10-CM | POA: Diagnosis not present

## 2024-04-02 LAB — URINALYSIS, W/ REFLEX TO CULTURE (INFECTION SUSPECTED)
Bacteria, UA: NONE SEEN
Bilirubin Urine: NEGATIVE
Glucose, UA: NEGATIVE mg/dL
Hgb urine dipstick: NEGATIVE
Ketones, ur: 5 mg/dL — AB
Leukocytes,Ua: NEGATIVE
Nitrite: NEGATIVE
Protein, ur: NEGATIVE mg/dL
Specific Gravity, Urine: 1.009 (ref 1.005–1.030)
pH: 6 (ref 5.0–8.0)

## 2024-04-02 LAB — BASIC METABOLIC PANEL WITH GFR
Anion gap: 11 (ref 5–15)
BUN: 23 mg/dL (ref 8–23)
CO2: 25 mmol/L (ref 22–32)
Calcium: 9.5 mg/dL (ref 8.9–10.3)
Chloride: 103 mmol/L (ref 98–111)
Creatinine, Ser: 1.09 mg/dL (ref 0.61–1.24)
GFR, Estimated: >60 mL/min (ref >60)
Glucose, Bld: 99 mg/dL (ref 70–99)
Potassium: 4.1 mmol/L (ref 3.5–5.1)
Sodium: 139 mmol/L (ref 135–145)

## 2024-04-02 LAB — CBC WITH DIFFERENTIAL/PLATELET
Abs Immature Granulocytes: 0.02 K/uL (ref 0.00–0.07)
Basophils Absolute: 0 K/uL (ref 0.0–0.1)
Basophils Relative: 0 %
Eosinophils Absolute: 0.2 K/uL (ref 0.0–0.5)
Eosinophils Relative: 2 %
HCT: 37.5 % — ABNORMAL LOW (ref 39.0–52.0)
Hemoglobin: 12.8 g/dL — ABNORMAL LOW (ref 13.0–17.0)
Immature Granulocytes: 0 %
Lymphocytes Relative: 25 %
Lymphs Abs: 1.5 K/uL (ref 0.7–4.0)
MCH: 34.4 pg — ABNORMAL HIGH (ref 26.0–34.0)
MCHC: 34.1 g/dL (ref 30.0–36.0)
MCV: 100.8 fL — ABNORMAL HIGH (ref 80.0–100.0)
Monocytes Absolute: 0.7 K/uL (ref 0.1–1.0)
Monocytes Relative: 11 %
Neutro Abs: 3.8 K/uL (ref 1.7–7.7)
Neutrophils Relative %: 62 %
Platelets: 203 K/uL (ref 150–400)
RBC: 3.72 MIL/uL — ABNORMAL LOW (ref 4.22–5.81)
RDW: 12.7 % (ref 11.5–15.5)
WBC: 6.1 K/uL (ref 4.0–10.5)
nRBC: 0 % (ref 0.0–0.2)

## 2024-04-02 LAB — BRAIN NATRIURETIC PEPTIDE: B Natriuretic Peptide: 77 pg/mL (ref 0.0–100.0)

## 2024-04-02 NOTE — ED Triage Notes (Addendum)
 Pt BIB [facility] due to a fall at home after being startled by his wife resulting in a head laceration to the back of his head. Pt is not on blood thinners and did not have LOC.

## 2024-04-02 NOTE — ED Provider Notes (Signed)
 [place] EMERGENCY DEPARTMENT AT [facility] Provider Note   CSN: [id number] Arrival date & time: 04/02/24  2052     History  Chief Complaint  Patient presents with   [name]    [name] is a 88 y.o. male with PMH as listed below who presents BIB [facility] due to a fall at home resulting in a head laceration to the back of his head. Pt is not on blood thinners and did not have LOC.  H/o parkinson's disease and states that he fell in his house d/t his parkinson's gait and hit the back of his head. Has been feeling some increased fatigue recently and has had some increased leg swelling that isn't normal for him. No f/c, cough, CP, back pain, abd pain, urinary sxs, orthopnea. No recent changes to medicines. Tdap updated march 2025.   Past Medical History:  Diagnosis Date   Ankylosing spondylitis (HCC) dx'd ~ 1974   Arthritis    back (08/10/2015)   BENIGN PROSTATIC HYPERTROPHY, WITH OBSTRUCTION 01/15/2010   Bleeding duodenal ulcer    Bleeding esophageal ulcer    Bleeding stomach ulcer    GERD 02/22/2008   History of blood transfusion 08/26/1986   lost ~ 1/2 of my blood volume from multiple bleeding ulcers   History of hiatal hernia    HYPERGLYCEMIA 11/18/2007   HYPERLIPIDEMIA 02/22/2008   HYPERTENSION, UNSPECIFIED 11/10/2009   Kidney stones    Osteoarthritis    c-spine   Parkinson's disease (HCC)    PEPTIC ULCER DISEASE 11/17/2007   Situational depression    son died in MVA 06-21-2015   TIA (transient ischemic attack)    not that I know of in the past; they are trying to determine if I've had one today (08/10/2015)       Home Medications Prior to Admission medications   Medication Sig Start Date End Date Taking? Authorizing Provider  aspirin  81 MG tablet Take 81 mg by mouth daily.      [provider]  atorvastatin  (LIPITOR) 10 MG tablet TAKE 1 TABLET BY MOUTH DAILY 01/20/24   [place], Peter M, MD  carbidopa -levodopa  (SINEMET  IR) 25-100 MG tablet  TAKE 1 TABLET BY MOUTH 5 TIMES  DAILY (AT 6 AM, 10 AM, 2 PM, 6  PM, 9 PM DAILY ) 11/28/23   Buck Saucer, MD  cholecalciferol  (VITAMIN D3) 25 MCG (1000 UNIT) tablet Take 1,000 Units by mouth daily. 04/13/19   [provider]  levothyroxine  (SYNTHROID ) 50 MCG tablet Take 50 mcg by mouth daily before breakfast. 01/23/21   [provider]  losartan -hydrochlorothiazide  (HYZAAR) 50-12.5 MG tablet Take 0.5 tablets by mouth daily. 11/03/23   [place], Peter M, MD  Multiple Vitamin (MULTIVITAMIN WITH MINERALS) TABS tablet Take 1 tablet by mouth daily with breakfast.    [provider]  MYRBETRIQ 50 MG TB24 tablet Take 50 mg by mouth daily. 01/23/24   [provider]  pantoprazole  (PROTONIX ) 20 MG tablet Take 20 mg by mouth at bedtime. 03/09/20   [provider]  Pimavanserin Tartrate  (NUPLAZID ) 10 MG TABS Take 10 mg by mouth daily. Patient not taking: Reported on 11/03/2023 10/22/23   Lomax, Amy, NP  PRESCRIPTION MEDICATION BiPAP- At bedtime    [provider]  Secukinumab  (COSENTYX  Octa) Inject into the vein every 30 (thirty) days.    [provider]  sulindac (CLINORIL) 150 MG tablet Take 150 mg by mouth at bedtime.    [provider]  SYSTANE COMPLETE PF 0.6 %  SOLN Place 1 drop into both eyes 4 (four) times daily.    [provider]      Allergies    Patient has no known allergies.    Review of Systems   Review of Systems A 10 point review of systems was performed and is negative unless otherwise reported in HPI.  Physical Exam Updated Vital Signs BP (!) 184/91 (BP Location: Left Arm)   Pulse 93   Temp 98.3 F (36.8 C) (Oral)   Resp 20   Ht 5' 8 (1.727 m)   Wt 79.4 kg   SpO2 97%   BMI 26.61 kg/m  Physical Exam General: Elderly- appearing male, lying in bed.  HEENT: Small hematoma to crown of head with 3 cm linear laceration, some minimal venous oozing that stops with direct pressure. No facial trauma. EOMI, PERRLA,  Sclera anicteric, MMM, trachea midline. Masked facies. NO midline C-spine TTP deformities/step-offs.  Cardiology: RRR, no murmurs/rubs/gallops. No chest wall TTP  Resp: Normal respiratory rate and effort. CTAB, no wheezes, rhonchi, crackles.  Abd: Soft, non-tender, non-distended. No rebound tenderness or guarding.  GU: Deferred. MSK: No peripheral edema or signs of trauma. Extremities without deformity or TTP. No cyanosis or clubbing. Skin: warm, dry.  Back: No T or L spine TTP deformities or stepoffs.  Neuro: A&Ox4, CNs II-XII grossly intact. MAEs. Sensation grossly intact.  Psych: Normal mood and affect.   ED Results / Procedures / Treatments   Labs (all labs ordered are listed, but only abnormal results are displayed) Labs Reviewed  CBC WITH DIFFERENTIAL/PLATELET - Abnormal; Notable for the following components:      Result Value   RBC 3.72 (*)    Hemoglobin 12.8 (*)    HCT 37.5 (*)    MCV 100.8 (*)    MCH 34.4 (*)    All other components within normal limits  URINALYSIS, W/ REFLEX TO CULTURE (INFECTION SUSPECTED) - Abnormal; Notable for the following components:   Ketones, ur 5 (*)    All other components within normal limits  BASIC METABOLIC PANEL WITH GFR  BRAIN NATRIURETIC PEPTIDE    EKG EKG Interpretation Date/Time:  Friday April 02 2024 21:08:15 EDT Ventricular Rate:  88 PR Interval:  199 QRS Duration:  76 QT Interval:  345 QTC Calculation: 418 R Axis:   41  Text Interpretation: Sinus rhythm Confirmed by Franklyn Gills [phone]) on 04/02/2024 9:41:41 PM  Radiology CTH: 1. No acute intracranial abnormality. 2. No acute displaced fracture or traumatic listhesis of the cervical spine.  CT C-spine: 1. No acute intracranial abnormality. 2. No acute displaced fracture or traumatic listhesis of the cervical spine.    Procedures .Laceration Repair  Date/Time: 04/09/2024 3:28 PM  Performed by: Franklyn Gills SAILOR, MD Authorized by: Franklyn Gills SAILOR, MD   Consent:     Consent obtained:  Verbal   Consent given by:  Patient and spouse   Risks discussed:  Infection, need for additional repair, nerve damage, pain, poor cosmetic result, poor wound healing, tendon damage and retained foreign body Universal protocol:    Imaging studies available: yes     Immediately prior to procedure, a time out was called: yes     Patient identity confirmed:  Verbally with patient Anesthesia:    Anesthesia method:  None Laceration details:    Location:  Scalp   Scalp location:  Crown   Length (cm):  3   Depth (mm):  5 Pre-procedure details:    Preparation:  Imaging obtained to evaluate  for foreign bodies Exploration:    Imaging outcome: foreign body not noted     Wound exploration: wound explored through full range of motion     Wound extent: areolar tissue not violated, fascia not violated, no foreign body, no signs of injury, no nerve damage, no tendon damage, no underlying fracture and no vascular damage     Contaminated: no   Treatment:    Area cleansed with:  Saline Skin repair:    Repair method:  Staples   Number of staples:  5 Approximation:    Approximation:  Close Repair type:    Repair type:  Simple Post-procedure details:    Dressing:  Sterile dressing   Procedure completion:  Tolerated well, no immediate complications     Medications Ordered in ED Medications - No data to display  ED Course/ Medical Decision Making/ A&P                          Medical Decision Making Amount and/or Complexity of Data Reviewed Labs: ordered. Decision-making details documented in ED Course. Radiology: ordered. Decision-making details documented in ED Course.    This patient presents to the ED for concern of fall, increased leg swelling; this involves an extensive number of treatment options, and is a complaint that carries with it a high risk of complications and morbidity.  I considered the following differential and admission for this acute, potentially  life threatening condition.   MDM:    DDX for trauma includes but is not limited to:  -Head Injury such as skull fx or ICH - small laceration to crown of head with small hematoma. No LOC/AC. CTH and CT c-spine reassuringly unremarkable. Repaired laceration w/ 5 staples at bedside and hemostasis achieved w/ staples/direct pressure. Placed ABD pad and sterile dressing. Discussed signs of infection and staple removal, as well as possibility of retained foreign body/scarring.  -Low c/f Chest Injury and Abdominal Injury - including hemo/pneumothorax, cardiac, abdominal solid and hollow organ injury -Low c/f hemorrhage, he is actually hypertensive - from this perspective he is asymptomatic with no signs of hypertensive emergency. -He does report increased leg swelling not normal for him - obtained basic labs which were reassuring and BNP was wnl. UA w/ no significant abnormalities and renal function at baseline.  No tachypnea/hypoxia/SOB to indicate HF exacerbation. Advised f/u for this with his PCP within 1 week, same time as staple removal.     Clinical Course as of 04/09/24 1532  Fri Apr 02, 2024  2147 CT Head Wo Contrast 1. No acute intracranial abnormality. 2. No acute displaced fracture or traumatic listhesis of the cervical spine.   [HN]  2240 Urinalysis, w/ Reflex to Culture (Infection Suspected) -Urine, Clean Catch(!) neg [HN]  2240 B Natriuretic Peptide: 77.0 wnl [HN]    Clinical Course User Index [HN] Franklyn Sid SAILOR, MD    Labs: I Ordered, and personally interpreted labs.  The pertinent results include:  those listed above  Imaging Studies ordered: I ordered imaging studies including CTH, CT C-spine I independently visualized and interpreted imaging. I agree with the radiologist interpretation  Additional history obtained from chart review, wife at bedside.    Reevaluation: After the interventions noted above, I reevaluated the patient and found that they have  :improved  Social Determinants of Health: Lives at home with wife.  Disposition:  DC w/ discharge instructions/return precautions. All questions answered to patient's satisfaction.    Co morbidities that complicate the  patient evaluation  Past Medical History:  Diagnosis Date   Ankylosing spondylitis (HCC) dx'd ~ 1974   Arthritis    back (08/10/2015)   BENIGN PROSTATIC HYPERTROPHY, WITH OBSTRUCTION 01/15/2010   Bleeding duodenal ulcer    Bleeding esophageal ulcer    Bleeding stomach ulcer    GERD 02/22/2008   History of blood transfusion 08/26/1986   lost ~ 1/2 of my blood volume from multiple bleeding ulcers   History of hiatal hernia    HYPERGLYCEMIA 11/18/2007   HYPERLIPIDEMIA 02/22/2008   HYPERTENSION, UNSPECIFIED 11/10/2009   Kidney stones    Osteoarthritis    c-spine   Parkinson's disease (HCC)    PEPTIC ULCER DISEASE 11/17/2007   Situational depression    son died in MVA Jun 18, 2015   TIA (transient ischemic attack)    not that I know of in the past; they are trying to determine if I've had one today (08/10/2015)     Medicines No orders of the defined types were placed in this encounter.   I have reviewed the patients home medicines and have made adjustments as needed  Problem List / ED Course: Problem List Items Addressed This Visit   None Visit Diagnoses       Fall in home, initial encounter    -  Primary     Laceration of scalp, initial encounter                       This note was created using dictation software, which may contain spelling or grammatical errors.    Franklyn Sid SAILOR, MD 04/09/24 [phone]

## 2024-04-02 NOTE — Discharge Instructions (Signed)
 Thank you for coming to [facility] Emergency Department. You were seen for fall at home and head injury. We did an exam, labs, and imaging, and these showed a laceration to your posterior scalp. Please have the 5 staples removed in 7-10 days by a healthcare professional.   Please follow up with your primary care provider within 1 week.   Do not hesitate to return to the ED or call 911 if you experience: -Worsening symptoms -Lightheadedness, passing out -Signs of infection including increased redness, increased swelling, increased pain, pus drainage, fevers/chills -Anything else that concerns you

## 2024-04-07 ENCOUNTER — Ambulatory Visit

## 2024-04-07 DIAGNOSIS — R2689 Other abnormalities of gait and mobility: Secondary | ICD-10-CM | POA: Diagnosis not present

## 2024-04-07 DIAGNOSIS — M6281 Muscle weakness (generalized): Secondary | ICD-10-CM

## 2024-04-07 DIAGNOSIS — R29818 Other symptoms and signs involving the nervous system: Secondary | ICD-10-CM | POA: Diagnosis not present

## 2024-04-07 DIAGNOSIS — G20A2 Parkinson's disease without dyskinesia, with fluctuations: Secondary | ICD-10-CM | POA: Diagnosis not present

## 2024-04-07 DIAGNOSIS — R2681 Unsteadiness on feet: Secondary | ICD-10-CM | POA: Diagnosis not present

## 2024-04-07 DIAGNOSIS — R293 Abnormal posture: Secondary | ICD-10-CM | POA: Diagnosis not present

## 2024-04-07 DIAGNOSIS — R262 Difficulty in walking, not elsewhere classified: Secondary | ICD-10-CM

## 2024-04-07 NOTE — Therapy (Signed)
 OUTPATIENT PHYSICAL THERAPY NEURO TREATMENT  Patient Name: [name] MRN: [id number] DOB:1936-07-01, 88 y.o., male Today's Date: 04/07/2024   PCP: Yolande Toribio MATSU, MD REFERRING PROVIDER: Buck Saucer, MD      END OF SESSION:  PT End of Session - 04/07/24 1621     Visit Number 9    Number of Visits 13    Date for PT Re-Evaluation 05/04/24    Authorization Type Medicare/[employer]    Progress Note Due on Visit 16    PT Start Time 1615    PT Stop Time 1700    PT Time Calculation (min) 45 min           Past Medical History:  Diagnosis Date   Ankylosing spondylitis (HCC) dx'd ~ 1974   Arthritis    back (08/10/2015)   BENIGN PROSTATIC HYPERTROPHY, WITH OBSTRUCTION 01/15/2010   Bleeding duodenal ulcer    Bleeding esophageal ulcer    Bleeding stomach ulcer    GERD 02/22/2008   History of blood transfusion 08/26/1986   lost ~ 1/2 of my blood volume from multiple bleeding ulcers   History of hiatal hernia    HYPERGLYCEMIA 11/18/2007   HYPERLIPIDEMIA 02/22/2008   HYPERTENSION, UNSPECIFIED 11/10/2009   Kidney stones    Osteoarthritis    c-spine   Parkinson's disease (HCC)    PEPTIC ULCER DISEASE 11/17/2007   Situational depression    son died in MVA 17-Jun-2015   TIA (transient ischemic attack)    not that I know of in the past; they are trying to determine if I've had one today (08/10/2015)   Past Surgical History:  Procedure Laterality Date   CATARACT EXTRACTION W/ INTRAOCULAR LENS  IMPLANT, BILATERAL Bilateral 2013   Patient Active Problem List   Diagnosis Date Noted   Ankylosing spondylitis of cervicothoracic region (HCC) 10/16/2016   TIA (transient ischemic attack) 08/10/2015   Carotid stenosis 08/04/2014   Hyperlipidemia 02/21/2012   BENIGN PROSTATIC HYPERTROPHY, WITH OBSTRUCTION 01/15/2010   Essential hypertension 11/10/2009   GERD 02/22/2008   NEPHROLITHIASIS, HX OF 11/17/2007    ONSET DATE: PD x years onset  REFERRING DIAG: R26.81 (ICD-10-CM)  - Unsteadiness on feet R29.3 (ICD-10-CM) - Abnormal posture R27.8 (ICD-10-CM) - Other lack of coordination G20.A2 (ICD-10-CM) - Parkinson's disease with fluctuating manifestations, unspecified whether dyskinesia present (HCC)  THERAPY DIAG:  Other symptoms and signs involving the nervous system  Muscle weakness (generalized)  Unsteadiness on feet  Abnormal posture  Parkinson's disease with fluctuating manifestations, unspecified whether dyskinesia present (HCC)  Other abnormalities of gait and mobility  Difficulty in walking, not elsewhere classified  Rationale for Evaluation and Treatment: Rehabilitation  SUBJECTIVE:  SUBJECTIVE STATEMENT: Had a backwards fall w/ scalp laceration.   Pt accompanied by: significant other  PERTINENT HISTORY: complex medical history of peptic ulcer disease with history of upper GI bleed, TIA, depression, degenerative disc disease in the neck, osteoarthritis, kidney stones, hyperlipidemia, hypertension, history of hiatal hernia, BPH, arthritis, and ankylosing spondylitis, and parkinsonism  PAIN:  Are you having pain? No worse for chronic pain, no acute pain  PRECAUTIONS: Fall  RED FLAGS: None   WEIGHT BEARING RESTRICTIONS: No  FALLS: Has patient fallen in last 3 months? Unsure as to number, notes fall/LOB every other week  LIVING ENVIRONMENT: Lives with: lives with their spouse Lives in: House/apartment Stairs: Ramp to enter, stairs to loft and basement, ground floor set-up Has following equipment at home: [name] - 2 wheeled  PLOF: Needs assistance with ADLs, Needs assistance with homemaking, and Needs assistance with gait  PATIENT GOALS:   OBJECTIVE:   TODAY'S TREATMENT: 04/07/24 Activity Comments  Attempted recumbent bicycle--right ankle limiting    NU-step for pre-gait 3 min warm-up 2 min forced fast pace therapist assist 60 sec self-pace Completing 3 rounds 10 min total  Gait training -U-step and CGA w/ cues for running speed x 2 min traveling 190 ft. 2 rounds completed  Balance/coordination Medicine ball throw 3x10 Standing trunk twists 1x10 w/ CGA for sequence Med ball slam 1x10          TODAY'S TREATMENT: 03/31/24 Activity Comments  NU-step level 6 x 8 min Steady state >80 SPM  Pre-gait/gait training -weight shift: lateral/ant-post -step height targets for visual cues -U-step w/ visual destination targets and counted steps -counted steps for 180 deg turns -resisted gait--difficulty and causing shorter step length -gait for speed w/ laser for step length -sit to stand w/ step initiation via visual cue   Standing in // bars: Squat touch into chair x10 Sliding hands back on // bars x10 Modified PWR! Rock x10 Modified PWR! Twist x10 Standing big step forward to different colored dots cued for L and R foot Bilat UE support on // bars  Amb with RW Cues for big steps and visual cueing with RTB across the front of RW                  Note: Objective measures were completed at Evaluation unless otherwise noted.  DIAGNOSTIC FINDINGS:   COGNITION: Overall cognitive status: Within functional limits for tasks assessed and some instances of difficulty attending to task/sequencing   SENSATION: Not tested  COORDINATION: Deficits to rapid alternating movements    MUSCLE TONE: rigidity  MUSCLE LENGTH: 10-15 degree knee flexion contracture bilat   POSTURE: rounded shoulders, forward head, and increased thoracic kyphosis  LOWER EXTREMITY ROM:     Right ankle ROM limits from fusion  LOWER EXTREMITY MMT:    4/5 gross BLE  BED MOBILITY:  Findings: NT, reports fluctuating performance  TRANSFERS: Sit to stand: Modified independence and CGA  Assistive device utilized: U-Step     Stand to sit: CGA   Assistive device utilized: u-step     Chair to chair: Modified independence and CGA  Assistive device utilized: U-step         CURB:  Findings: CGA-min A  STAIRS: Findings: Comments: CGA-min A GAIT: Findings: Distance walked: 150, Assistive device utilized:U-step, Level of assistance: CGA, and Comments: freezing in turns  FUNCTIONAL TESTS:  5 times sit to stand: 47.25 sec Timed up and go (TUG): 36 sec w/ U-step 10 meter walk test: 23.34 sec = 1.4  ft/sec w/ Shan Levins Balance Scale:  180 degree turns: 16-18 steps  Item Test date: 01/20/24 Test date:  Test date:   Sitting to standing 2. able to stand using hands after several tries Insert OPRCBERGREEVAL SmartPhrase at re-test date Insert OPRCBERGREEVAL SmartPhrase at re-test date  2. Standing unsupported 3. able to stand 2 minutes with supervision    3. Sitting with back unsupported, feet supported 4. able to sit safely and securely for 2 minutes    4. Standing to sitting 2. uses back of legs against chair to control descent    5. Pivot transfer  2. able to transfer with verbal cuing and/or supervision    6. Standing unsupported with eyes closed 3. able to stand 10 seconds with supervision    7. Standing unsupported with feet together 3. able to place feet together independently and stand 1 minute with supervision    8. Reaching forward with outstretched arms while standing 3. can reach forward 12 cm (5 inches)    9. Pick up object from the floor from standing 0. unable to try/needs assistance to keep from losing balance or falling    10. Turning to look behind over left and right shoulders while standing 2. turns sideways but only maintains balance    11. Turn 360 degrees 0. needs assistance while turning    12. Place alternate foot on step or stool while standing unsupported 1. able to complete > 2 steps needs minimal assist    13. Standing unsupported one foot in front 0. loses balance while stepping or standing    14. Standing on  one leg 0. unable to try of needs assist to prevent fall      Total Score 25/56 Total Score /56 Total Score /56     PATIENT SURVEYS:  Freezing of gait questionnaire provided to fill out and return                                                                                                                              TREATMENT DATE:     PATIENT EDUCATION:a Education details: assessment details, rationale of tx interventions Person educated: Patient and Spouse Education method: Explanation Education comprehension: verbalized understanding  HOME EXERCISE PROGRAM: TBD  GOALS: Goals reviewed with patient? Yes  SHORT TERM GOALS: Target date: 02/17/2024    Patient will perform HEP with family/caregiver supervision for improved strength, balance, transfers, and gait  Baseline: Goal status: MET  2.  Demo improved postural control and BLE strength per time 35 sec 5xSTS test Baseline: 47 sec; 37 sec Goal status: NOT MET    LONG TERM GOALS: Target date: 05/04/24    Demo improved static strength and safety with ADL per score 40/56 Berg Balance Test Baseline: 25/56; 27/56 Goal status: IN PROGRESS 03/16/24  2.  Demo improved safety/efficiency of turning negotiating 180 degree turn in 10-12 steps Baseline: 16-18 w/ U-step; 20-22 w/ U-step Goal status: IN PROGRESS  3.  Demo improved safety with mobility per time 25 sec TUG test Baseline: 36 sec w/ U-step; 1 min 30 sec w/ U-step Goal status: IN PROGRESS  4.  Spouse to teach-back strategies to help with reducing freezing of gait Baseline:  Goal status: IMET   ASSESSMENT:  CLINICAL IMPRESSION: Forced pace on NU-step to increase speed and coordination for rapid alternating motion to prepare for gait training to reinforce larger stride and faster speed to good effect as able to travel 190 ft in 2 min for two rounds which would be speed of 1.5 ft/sec. Balance/coordination activities to improve safety/stability with unsupported  standing against postural perturbations from throwing weighted ball. Good tolerance with only 1 instance of retro-LOB requiring total assist to correct. Continued sessions to progress POC details to prevent functional decline and progress caregiver training for safe techniques for carryover   OBJECTIVE IMPAIRMENTS: Abnormal gait, decreased activity tolerance, decreased balance, decreased coordination, decreased mobility, difficulty walking, decreased strength, improper body mechanics, and postural dysfunction.   ACTIVITY LIMITATIONS: carrying, lifting, bending, standing, squatting, stairs, transfers, bed mobility, reach over head, and locomotion level  PARTICIPATION LIMITATIONS: meal prep, cleaning, shopping, community activity, and ADL  PERSONAL FACTORS: Age, Time since onset of injury/illness/exacerbation, and 3+ comorbidities: PMH are also affecting patient's functional outcome.   REHAB POTENTIAL: Good  CLINICAL DECISION MAKING: Unstable/unpredictable  EVALUATION COMPLEXITY: High  PLAN:  PT FREQUENCY: 1x/week  PT DURATION: 8 weeks  PLANNED INTERVENTIONS: 97750- Physical Performance Testing, 97110-Therapeutic exercises, 97530- Therapeutic activity, [id number]- Neuromuscular re-education, 97535- Self Care, 02859- Manual therapy, [phone]- Gait training, and [phone]- Aquatic Therapy  PLAN FOR NEXT SESSION: auditory cues for gait for outdoors ambulation to enable walking at park   4:22 PM, 04/07/24 Jonette MARLA Sandifer, PT, DPT Physical Therapist- [place] Office Number: [phone]

## 2024-04-09 DIAGNOSIS — S0101XA Laceration without foreign body of scalp, initial encounter: Secondary | ICD-10-CM | POA: Diagnosis not present

## 2024-04-09 DIAGNOSIS — G20A1 Parkinson's disease without dyskinesia, without mention of fluctuations: Secondary | ICD-10-CM | POA: Diagnosis not present

## 2024-04-09 DIAGNOSIS — Z4802 Encounter for removal of sutures: Secondary | ICD-10-CM | POA: Diagnosis not present

## 2024-04-09 DIAGNOSIS — R269 Unspecified abnormalities of gait and mobility: Secondary | ICD-10-CM | POA: Diagnosis not present

## 2024-04-09 DIAGNOSIS — R296 Repeated falls: Secondary | ICD-10-CM | POA: Diagnosis not present

## 2024-04-13 ENCOUNTER — Encounter: Payer: Self-pay | Admitting: Physical Therapy

## 2024-04-13 ENCOUNTER — Ambulatory Visit: Admitting: Physical Therapy

## 2024-04-13 DIAGNOSIS — R2681 Unsteadiness on feet: Secondary | ICD-10-CM

## 2024-04-13 DIAGNOSIS — G20A2 Parkinson's disease without dyskinesia, with fluctuations: Secondary | ICD-10-CM | POA: Diagnosis not present

## 2024-04-13 DIAGNOSIS — R2689 Other abnormalities of gait and mobility: Secondary | ICD-10-CM | POA: Diagnosis not present

## 2024-04-13 DIAGNOSIS — R262 Difficulty in walking, not elsewhere classified: Secondary | ICD-10-CM

## 2024-04-13 DIAGNOSIS — R29818 Other symptoms and signs involving the nervous system: Secondary | ICD-10-CM | POA: Diagnosis not present

## 2024-04-13 DIAGNOSIS — M6281 Muscle weakness (generalized): Secondary | ICD-10-CM | POA: Diagnosis not present

## 2024-04-13 DIAGNOSIS — R293 Abnormal posture: Secondary | ICD-10-CM | POA: Diagnosis not present

## 2024-04-13 DIAGNOSIS — R278 Other lack of coordination: Secondary | ICD-10-CM

## 2024-04-13 NOTE — Therapy (Signed)
 OUTPATIENT PHYSICAL THERAPY NEURO TREATMENT AND PROGRESS NOTE  Progress Note Reporting Period 01/20/2024 to 04/13/2024  See note below for Objective Data and Assessment of Progress/Goals.    Patient Name: [name] MRN: [id number] DOB:[birth year], 88 y.o., male Today's Date: 04/13/2024   PCP: Yolande Toribio MATSU, MD REFERRING PROVIDER: Buck Saucer, MD      END OF SESSION:  PT End of Session - 04/13/24 1438     Visit Number 10    Number of Visits 13    Date for PT Re-Evaluation 05/04/24    Authorization Type Medicare/AARP    Progress Note Due on Visit 16    PT Start Time 1438    PT Stop Time 1520    PT Time Calculation (min) 42 min            Past Medical History:  Diagnosis Date   Ankylosing spondylitis (HCC) dx'd ~ 1974   Arthritis    back (08/10/2015)   BENIGN PROSTATIC HYPERTROPHY, WITH OBSTRUCTION 01/15/2010   Bleeding duodenal ulcer    Bleeding esophageal ulcer    Bleeding stomach ulcer    GERD 02/22/2008   History of blood transfusion 08/26/1986   lost ~ 1/2 of my blood volume from multiple bleeding ulcers   History of hiatal hernia    HYPERGLYCEMIA 11/18/2007   HYPERLIPIDEMIA 02/22/2008   HYPERTENSION, UNSPECIFIED 11/10/2009   Kidney stones    Osteoarthritis    c-spine   Parkinson's disease (HCC)    PEPTIC ULCER DISEASE 11/17/2007   Situational depression    son died in MVA 24-Jun-2015   TIA (transient ischemic attack)    not that I know of in the past; they are trying to determine if I've had one today (08/10/2015)   Past Surgical History:  Procedure Laterality Date   CATARACT EXTRACTION W/ INTRAOCULAR LENS  IMPLANT, BILATERAL Bilateral 2013   Patient Active Problem List   Diagnosis Date Noted   Ankylosing spondylitis of cervicothoracic region (HCC) 10/16/2016   TIA (transient ischemic attack) 08/10/2015   Carotid stenosis 08/04/2014   Hyperlipidemia 02/21/2012   BENIGN PROSTATIC HYPERTROPHY, WITH OBSTRUCTION 01/15/2010   Essential  hypertension 11/10/2009   GERD 02/22/2008   NEPHROLITHIASIS, HX OF 11/17/2007    ONSET DATE: PD x years onset  REFERRING DIAG: R26.81 (ICD-10-CM) - Unsteadiness on feet R29.3 (ICD-10-CM) - Abnormal posture R27.8 (ICD-10-CM) - Other lack of coordination G20.A2 (ICD-10-CM) - Parkinson's disease with fluctuating manifestations, unspecified whether dyskinesia present (HCC)  THERAPY DIAG:  Other symptoms and signs involving the nervous system  Muscle weakness (generalized)  Unsteadiness on feet  Abnormal posture  Parkinson's disease with fluctuating manifestations, unspecified whether dyskinesia present (HCC)  Other abnormalities of gait and mobility  Difficulty in walking, not elsewhere classified  Other lack of coordination  Rationale for Evaluation and Treatment: Rehabilitation  SUBJECTIVE:  SUBJECTIVE STATEMENT: Pt states he is having a good day today. Endorses no pain or soreness from the fall.   Pt accompanied by: significant other  PERTINENT HISTORY: complex medical history of peptic ulcer disease with history of upper GI bleed, TIA, depression, degenerative disc disease in the neck, osteoarthritis, kidney stones, hyperlipidemia, hypertension, history of hiatal hernia, BPH, arthritis, and ankylosing spondylitis, and parkinsonism  PAIN:  Are you having pain? No worse for chronic pain, no acute pain  PRECAUTIONS: Fall  RED FLAGS: None   WEIGHT BEARING RESTRICTIONS: No  FALLS: Has patient fallen in last 3 months? Unsure as to number, notes fall/LOB every other week  LIVING ENVIRONMENT: Lives with: lives with their spouse Lives in: House/apartment Stairs: Ramp to enter, stairs to loft and basement, ground floor set-up Has following equipment at home: [name] - 2 wheeled  PLOF:  Needs assistance with ADLs, Needs assistance with homemaking, and Needs assistance with gait  PATIENT GOALS:   OBJECTIVE:  TODAY'S TREATMENT: 04/13/24 Activity Comments  NU-step for pre-gait 3 min warm-up 2 min forced fast pace therapist assist 10 min total     Standing in // bars: Sliding hands back on // bars as if performing modified row x10 Standing scap squeeze x10 Modified PWR! Step x10 using colored dots as targets   Modified PWR! Twist x10 using colored dots as targets Standing stepping on colored dots for left turn Bilat UE support on // bars  Amb with RW Cues for big steps and visual cueing with RTB across the front of RW -working on 180 turns L & R          TODAY'S TREATMENT: 04/07/24 Activity Comments  Attempted recumbent bicycle--right ankle limiting   NU-step for pre-gait 3 min warm-up 2 min forced fast pace therapist assist 60 sec self-pace Completing 3 rounds 10 min total  Gait training -U-step and CGA w/ cues for running speed x 2 min traveling 190 ft. 2 rounds completed  Balance/coordination Medicine ball throw 3x10 Standing trunk twists 1x10 w/ CGA for sequence Med ball slam 1x10                     Note: Objective measures were completed at Evaluation unless otherwise noted.  DIAGNOSTIC FINDINGS:   COGNITION: Overall cognitive status: Within functional limits for tasks assessed and some instances of difficulty attending to task/sequencing   SENSATION: Not tested  COORDINATION: Deficits to rapid alternating movements    MUSCLE TONE: rigidity  MUSCLE LENGTH: 10-15 degree knee flexion contracture bilat   POSTURE: rounded shoulders, forward head, and increased thoracic kyphosis  LOWER EXTREMITY ROM:     Right ankle ROM limits from fusion  LOWER EXTREMITY MMT:    4/5 gross BLE  BED MOBILITY:  Findings: NT, reports fluctuating performance  TRANSFERS: Sit to stand: Modified independence and CGA  Assistive device utilized:  U-Step     Stand to sit: CGA  Assistive device utilized: u-step     Chair to chair: Modified independence and CGA  Assistive device utilized: U-step         CURB:  Findings: CGA-min A  STAIRS: Findings: Comments: CGA-min A GAIT: Findings: Distance walked: 150, Assistive device utilized:U-step, Level of assistance: CGA, and Comments: freezing in turns  FUNCTIONAL TESTS:  5 times sit to stand: 47.25 sec Timed up and go (TUG): 36 sec w/ U-step 10 meter walk test: 23.34 sec = 1.4 ft/sec w/ U-step Berg Balance Scale:  180 degree turns: 16-18 steps  Item Test date: 01/20/24 Test date:  Test date:   Sitting to standing 2. able to stand using hands after several tries Insert OPRCBERGREEVAL SmartPhrase at re-test date Insert OPRCBERGREEVAL SmartPhrase at re-test date  2. Standing unsupported 3. able to stand 2 minutes with supervision    3. Sitting with back unsupported, feet supported 4. able to sit safely and securely for 2 minutes    4. Standing to sitting 2. uses back of legs against chair to control descent    5. Pivot transfer  2. able to transfer with verbal cuing and/or supervision    6. Standing unsupported with eyes closed 3. able to stand 10 seconds with supervision    7. Standing unsupported with feet together 3. able to place feet together independently and stand 1 minute with supervision    8. Reaching forward with outstretched arms while standing 3. can reach forward 12 cm (5 inches)    9. Pick up object from the floor from standing 0. unable to try/needs assistance to keep from losing balance or falling    10. Turning to look behind over left and right shoulders while standing 2. turns sideways but only maintains balance    11. Turn 360 degrees 0. needs assistance while turning    12. Place alternate foot on step or stool while standing unsupported 1. able to complete > 2 steps needs minimal assist    13. Standing unsupported one foot in front 0. loses balance while stepping  or standing    14. Standing on one leg 0. unable to try of needs assist to prevent fall      Total Score 25/56 Total Score /56 Total Score /56     PATIENT SURVEYS:  Freezing of gait questionnaire provided to fill out and return                                                                                                                              TREATMENT DATE:     PATIENT EDUCATION:a Education details: assessment details, rationale of tx interventions Person educated: Patient and Spouse Education method: Explanation Education comprehension: verbalized understanding  HOME EXERCISE PROGRAM: TBD  GOALS: Goals reviewed with patient? Yes  SHORT TERM GOALS: Target date: 02/17/2024    Patient will perform HEP with family/caregiver supervision for improved strength, balance, transfers, and gait  Baseline: Goal status: MET  2.  Demo improved postural control and BLE strength per time 35 sec 5xSTS test Baseline: 47 sec; 37 sec Goal status: NOT MET    LONG TERM GOALS: Target date: 05/04/24    Demo improved static strength and safety with ADL per score 40/56 Berg Balance Test Baseline: 25/56; 27/56 Goal status: IN PROGRESS 03/16/24  2.  Demo improved safety/efficiency of turning negotiating 180 degree turn in 10-12 steps Baseline: 16-18 w/ U-step; 20-22 w/ U-step; 12-18 steps given min A for RW negotiation 04/13/24 Goal status: IN PROGRESS  3.  Demo improved  safety with mobility per time 25 sec TUG test Baseline: 36 sec w/ U-step; 1 min 30 sec w/ U-step Goal status: IN PROGRESS  4.  Spouse to teach-back strategies to help with reducing freezing of gait Baseline:  Goal status: IMET   ASSESSMENT:  CLINICAL IMPRESSION: Forced pace on NU-step to increase speed and coordination for rapid alternating motion to prepare for gait training to reinforce larger stride and faster speed. Continued to work on standing posture and stability in // bars. Working on stepping and  coordination with colored dots as visual cues. Very stiff trunk and hips which may also be inhibiting his ability to perform turns.    OBJECTIVE IMPAIRMENTS: Abnormal gait, decreased activity tolerance, decreased balance, decreased coordination, decreased mobility, difficulty walking, decreased strength, improper body mechanics, and postural dysfunction.    PLAN:  PT FREQUENCY: 1x/week  PT DURATION: 8 weeks  PLANNED INTERVENTIONS: 97750- Physical Performance Testing, 97110-Therapeutic exercises, 97530- Therapeutic activity, [id number]- Neuromuscular re-education, 97535- Self Care, 02859- Manual therapy, [phone]- Gait training, and [phone]- Aquatic Therapy  PLAN FOR NEXT SESSION: auditory cues for gait for outdoors ambulation to enable walking at park   2:38 PM, 04/13/24 Rabecka Brendel April Ma L Emylee Decelle, PT, DPT Physical Therapist- [place] Office Number: [phone]

## 2024-04-14 DIAGNOSIS — M459 Ankylosing spondylitis of unspecified sites in spine: Secondary | ICD-10-CM | POA: Diagnosis not present

## 2024-04-20 ENCOUNTER — Ambulatory Visit

## 2024-04-20 DIAGNOSIS — R2681 Unsteadiness on feet: Secondary | ICD-10-CM | POA: Diagnosis not present

## 2024-04-20 DIAGNOSIS — R29818 Other symptoms and signs involving the nervous system: Secondary | ICD-10-CM | POA: Diagnosis not present

## 2024-04-20 DIAGNOSIS — R2689 Other abnormalities of gait and mobility: Secondary | ICD-10-CM

## 2024-04-20 DIAGNOSIS — R293 Abnormal posture: Secondary | ICD-10-CM

## 2024-04-20 DIAGNOSIS — M6281 Muscle weakness (generalized): Secondary | ICD-10-CM

## 2024-04-20 DIAGNOSIS — G20A2 Parkinson's disease without dyskinesia, with fluctuations: Secondary | ICD-10-CM

## 2024-04-20 DIAGNOSIS — R262 Difficulty in walking, not elsewhere classified: Secondary | ICD-10-CM

## 2024-04-20 NOTE — Therapy (Signed)
 OUTPATIENT PHYSICAL THERAPY NEURO TREATMENT  Patient Name: [name] MRN: [id number] DOB:Jan 17, 1936, 88 y.o., male Today's Date: 04/20/2024   PCP: Yolande Toribio MATSU, MD REFERRING PROVIDER: Buck Saucer, MD      END OF SESSION:  PT End of Session - 04/20/24 1447     Visit Number 11    Number of Visits 13    Date for PT Re-Evaluation 05/04/24    Authorization Type Medicare/[employer]    Progress Note Due on Visit 16    PT Start Time 1447    PT Stop Time 1530    PT Time Calculation (min) 43 min            Past Medical History:  Diagnosis Date   Ankylosing spondylitis (HCC) dx'd ~ 1974   Arthritis    back (08/10/2015)   BENIGN PROSTATIC HYPERTROPHY, WITH OBSTRUCTION 01/15/2010   Bleeding duodenal ulcer    Bleeding esophageal ulcer    Bleeding stomach ulcer    GERD 02/22/2008   History of blood transfusion 08/26/1986   lost ~ 1/2 of my blood volume from multiple bleeding ulcers   History of hiatal hernia    HYPERGLYCEMIA 11/18/2007   HYPERLIPIDEMIA 02/22/2008   HYPERTENSION, UNSPECIFIED 11/10/2009   Kidney stones    Osteoarthritis    c-spine   Parkinson's disease (HCC)    PEPTIC ULCER DISEASE 11/17/2007   Situational depression    son died in MVA June 03, 2015   TIA (transient ischemic attack)    not that I know of in the past; they are trying to determine if I've had one today (08/10/2015)   Past Surgical History:  Procedure Laterality Date   CATARACT EXTRACTION W/ INTRAOCULAR LENS  IMPLANT, BILATERAL Bilateral 2013   Patient Active Problem List   Diagnosis Date Noted   Ankylosing spondylitis of cervicothoracic region (HCC) 10/16/2016   TIA (transient ischemic attack) 08/10/2015   Carotid stenosis 08/04/2014   Hyperlipidemia 02/21/2012   BENIGN PROSTATIC HYPERTROPHY, WITH OBSTRUCTION 01/15/2010   Essential hypertension 11/10/2009   GERD 02/22/2008   NEPHROLITHIASIS, HX OF 11/17/2007    ONSET DATE: PD x years onset  REFERRING DIAG: R26.81  (ICD-10-CM) - Unsteadiness on feet R29.3 (ICD-10-CM) - Abnormal posture R27.8 (ICD-10-CM) - Other lack of coordination G20.A2 (ICD-10-CM) - Parkinson's disease with fluctuating manifestations, unspecified whether dyskinesia present (HCC)  THERAPY DIAG:  Other symptoms and signs involving the nervous system  Muscle weakness (generalized)  Unsteadiness on feet  Abnormal posture  Parkinson's disease with fluctuating manifestations, unspecified whether dyskinesia present (HCC)  Other abnormalities of gait and mobility  Difficulty in walking, not elsewhere classified  Rationale for Evaluation and Treatment: Rehabilitation  SUBJECTIVE:  SUBJECTIVE STATEMENT: Pt states he is having a good day today. Endorses no pain or soreness from the fall.   Pt accompanied by: significant other  PERTINENT HISTORY: complex medical history of peptic ulcer disease with history of upper GI bleed, TIA, depression, degenerative disc disease in the neck, osteoarthritis, kidney stones, hyperlipidemia, hypertension, history of hiatal hernia, BPH, arthritis, and ankylosing spondylitis, and parkinsonism  PAIN:  Are you having pain? No worse for chronic pain, no acute pain  PRECAUTIONS: Fall  RED FLAGS: None   WEIGHT BEARING RESTRICTIONS: No  FALLS: Has patient fallen in last 3 months? Unsure as to number, notes fall/LOB every other week  LIVING ENVIRONMENT: Lives with: lives with their spouse Lives in: House/apartment Stairs: Ramp to enter, stairs to loft and basement, ground floor set-up Has following equipment at home: [name] - 2 wheeled  PLOF: Needs assistance with ADLs, Needs assistance with homemaking, and Needs assistance with gait  PATIENT GOALS:   OBJECTIVE:   TODAY'S TREATMENT: 04/20/24 Activity Comments   NU-step for pregait   Gait training -Stair ambulation and obstacles for step height HHA for stepping over, verbal cues for kick out for descending stairs -visual cue strategies for step length using targets to kick foot -counted steps for stride length/speed                 TODAY'S TREATMENT: 04/13/24 Activity Comments  NU-step for pre-gait 3 min warm-up 2 min forced fast pace therapist assist 10 min total     Standing in // bars: Sliding hands back on // bars as if performing modified row x10 Standing scap squeeze x10 Modified PWR! Step x10 using colored dots as targets   Modified PWR! Twist x10 using colored dots as targets Standing stepping on colored dots for left turn Bilat UE support on // bars  Amb with RW Cues for big steps and visual cueing with RTB across the front of RW -working on 180 turns L & R          TODAY'S TREATMENT: 04/07/24 Activity Comments  Attempted recumbent bicycle--right ankle limiting   NU-step for pre-gait 3 min warm-up 2 min forced fast pace therapist assist 60 sec self-pace Completing 3 rounds 10 min total  Gait training -U-step and CGA w/ cues for running speed x 2 min traveling 190 ft. 2 rounds completed  Balance/coordination Medicine ball throw 3x10 Standing trunk twists 1x10 w/ CGA for sequence Med ball slam 1x10                     Note: Objective measures were completed at Evaluation unless otherwise noted.  DIAGNOSTIC FINDINGS:   COGNITION: Overall cognitive status: Within functional limits for tasks assessed and some instances of difficulty attending to task/sequencing   SENSATION: Not tested  COORDINATION: Deficits to rapid alternating movements    MUSCLE TONE: rigidity  MUSCLE LENGTH: 10-15 degree knee flexion contracture bilat   POSTURE: rounded shoulders, forward head, and increased thoracic kyphosis  LOWER EXTREMITY ROM:     Right ankle ROM limits from fusion  LOWER EXTREMITY MMT:    4/5  gross BLE  BED MOBILITY:  Findings: NT, reports fluctuating performance  TRANSFERS: Sit to stand: Modified independence and CGA  Assistive device utilized: U-Step     Stand to sit: CGA  Assistive device utilized: u-step     Chair to chair: Modified independence and CGA  Assistive device utilized: U-step         CURB:  Findings: CGA-min  A  STAIRS: Findings: Comments: CGA-min A GAIT: Findings: Distance walked: 150, Assistive device utilized:U-step, Level of assistance: CGA, and Comments: freezing in turns  FUNCTIONAL TESTS:  5 times sit to stand: 47.25 sec Timed up and go (TUG): 36 sec w/ U-step 10 meter walk test: 23.34 sec = 1.4 ft/sec w/ U-step Lars Balance Scale:  180 degree turns: 16-18 steps  Item Test date: 01/20/24 Test date:  Test date:   Sitting to standing 2. able to stand using hands after several tries Insert OPRCBERGREEVAL SmartPhrase at re-test date Insert OPRCBERGREEVAL SmartPhrase at re-test date  2. Standing unsupported 3. able to stand 2 minutes with supervision    3. Sitting with back unsupported, feet supported 4. able to sit safely and securely for 2 minutes    4. Standing to sitting 2. uses back of legs against chair to control descent    5. Pivot transfer  2. able to transfer with verbal cuing and/or supervision    6. Standing unsupported with eyes closed 3. able to stand 10 seconds with supervision    7. Standing unsupported with feet together 3. able to place feet together independently and stand 1 minute with supervision    8. Reaching forward with outstretched arms while standing 3. can reach forward 12 cm (5 inches)    9. Pick up object from the floor from standing 0. unable to try/needs assistance to keep from losing balance or falling    10. Turning to look behind over left and right shoulders while standing 2. turns sideways but only maintains balance    11. Turn 360 degrees 0. needs assistance while turning    12. Place alternate foot on step or  stool while standing unsupported 1. able to complete > 2 steps needs minimal assist    13. Standing unsupported one foot in front 0. loses balance while stepping or standing    14. Standing on one leg 0. unable to try of needs assist to prevent fall      Total Score 25/56 Total Score /56 Total Score /56     PATIENT SURVEYS:  Freezing of gait questionnaire provided to fill out and return                                                                                                                              TREATMENT DATE:     PATIENT EDUCATION:a Education details: assessment details, rationale of tx interventions Person educated: Patient and Spouse Education method: Explanation Education comprehension: verbalized understanding  HOME EXERCISE PROGRAM: TBD  GOALS: Goals reviewed with patient? Yes  SHORT TERM GOALS: Target date: 02/17/2024    Patient will perform HEP with family/caregiver supervision for improved strength, balance, transfers, and gait  Baseline: Goal status: MET  2.  Demo improved postural control and BLE strength per time 35 sec 5xSTS test Baseline: 47 sec; 37 sec Goal status: NOT MET    LONG TERM GOALS: Target date: 05/04/24  Demo improved static strength and safety with ADL per score 40/56 Berg Balance Test Baseline: 25/56; 27/56 Goal status: IN PROGRESS 03/16/24  2.  Demo improved safety/efficiency of turning negotiating 180 degree turn in 10-12 steps Baseline: 16-18 w/ U-step; 20-22 w/ U-step; 12-18 steps given min A for RW negotiation 04/13/24 Goal status: IN PROGRESS  3.  Demo improved safety with mobility per time 25 sec TUG test Baseline: 36 sec w/ U-step; 1 min 30 sec w/ U-step Goal status: IN PROGRESS  4.  Spouse to teach-back strategies to help with reducing freezing of gait Baseline:  Goal status: IMET   ASSESSMENT:  CLINICAL IMPRESSION: Forced pace on NU-step to increase speed and coordination for rapid alternating motion to  prepare for gait training to reinforce larger stride and faster speed. Stair ambulation to improve safety with descending stairs with good carryover to verbal/tactile cue of forced knee extension for heel strike initial contact and visual cues for step height over obstacles. CGA for ambulation throughout with improved carryover to stride length and step height with visual, auditory, and counted step objectives. Continued sessions to maintain functional mobility and reinforce strategies to spouse for carryover.    OBJECTIVE IMPAIRMENTS: Abnormal gait, decreased activity tolerance, decreased balance, decreased coordination, decreased mobility, difficulty walking, decreased strength, improper body mechanics, and postural dysfunction.    PLAN:  PT FREQUENCY: 1x/week  PT DURATION: 8 weeks  PLANNED INTERVENTIONS: 97750- Physical Performance Testing, 97110-Therapeutic exercises, 97530- Therapeutic activity, 97112- Neuromuscular re-education, 97535- Self Care, 02859- Manual therapy, [phone]- Gait training, and [phone]- Aquatic Therapy  PLAN FOR NEXT SESSION: auditory cues for gait for outdoors ambulation to enable walking at park   2:48 PM, 04/20/24 Tamara Kenyon K Yousif Edelson, PT, DPT Physical Therapist- [place] Office Number: [phone]

## 2024-04-27 ENCOUNTER — Encounter: Payer: Self-pay | Admitting: Physical Therapy

## 2024-04-27 ENCOUNTER — Ambulatory Visit: Attending: Neurology | Admitting: Physical Therapy

## 2024-04-27 DIAGNOSIS — G20A2 Parkinson's disease without dyskinesia, with fluctuations: Secondary | ICD-10-CM | POA: Insufficient documentation

## 2024-04-27 DIAGNOSIS — R2689 Other abnormalities of gait and mobility: Secondary | ICD-10-CM | POA: Insufficient documentation

## 2024-04-27 DIAGNOSIS — R293 Abnormal posture: Secondary | ICD-10-CM | POA: Diagnosis not present

## 2024-04-27 DIAGNOSIS — R29818 Other symptoms and signs involving the nervous system: Secondary | ICD-10-CM | POA: Diagnosis not present

## 2024-04-27 DIAGNOSIS — R278 Other lack of coordination: Secondary | ICD-10-CM | POA: Insufficient documentation

## 2024-04-27 DIAGNOSIS — R2681 Unsteadiness on feet: Secondary | ICD-10-CM | POA: Diagnosis not present

## 2024-04-27 DIAGNOSIS — M6281 Muscle weakness (generalized): Secondary | ICD-10-CM | POA: Diagnosis not present

## 2024-04-27 DIAGNOSIS — R262 Difficulty in walking, not elsewhere classified: Secondary | ICD-10-CM | POA: Insufficient documentation

## 2024-04-27 NOTE — Therapy (Signed)
 OUTPATIENT PHYSICAL THERAPY NEURO TREATMENT  Patient Name: [name] MRN: [id number] DOB:08/04/1936, 88 y.o., male Today's Date: 04/27/2024   PCP: Yolande Toribio MATSU, MD REFERRING PROVIDER: Buck Saucer, MD      END OF SESSION:  PT End of Session - 04/27/24 1447     Visit Number 12    Number of Visits 13    Date for PT Re-Evaluation 05/04/24    Authorization Type Medicare/AARP    Progress Note Due on Visit 16    PT Start Time 1447    PT Stop Time 1525    PT Time Calculation (min) 38 min             Past Medical History:  Diagnosis Date   Ankylosing spondylitis (HCC) dx'd ~ 1974   Arthritis    back (08/10/2015)   BENIGN PROSTATIC HYPERTROPHY, WITH OBSTRUCTION 01/15/2010   Bleeding duodenal ulcer    Bleeding esophageal ulcer    Bleeding stomach ulcer    GERD 02/22/2008   History of blood transfusion 08/26/1986   lost ~ 1/2 of my blood volume from multiple bleeding ulcers   History of hiatal hernia    HYPERGLYCEMIA 11/18/2007   HYPERLIPIDEMIA 02/22/2008   HYPERTENSION, UNSPECIFIED 11/10/2009   Kidney stones    Osteoarthritis    c-spine   Parkinson's disease (HCC)    PEPTIC ULCER DISEASE 11/17/2007   Situational depression    son died in MVA 27-Jun-2015   TIA (transient ischemic attack)    not that I know of in the past; they are trying to determine if I've had one today (08/10/2015)   Past Surgical History:  Procedure Laterality Date   CATARACT EXTRACTION W/ INTRAOCULAR LENS  IMPLANT, BILATERAL Bilateral 2013   Patient Active Problem List   Diagnosis Date Noted   Ankylosing spondylitis of cervicothoracic region (HCC) 10/16/2016   TIA (transient ischemic attack) 08/10/2015   Carotid stenosis 08/04/2014   Hyperlipidemia 02/21/2012   BENIGN PROSTATIC HYPERTROPHY, WITH OBSTRUCTION 01/15/2010   Essential hypertension 11/10/2009   GERD 02/22/2008   NEPHROLITHIASIS, HX OF 11/17/2007    ONSET DATE: PD x years onset  REFERRING DIAG: R26.81  (ICD-10-CM) - Unsteadiness on feet R29.3 (ICD-10-CM) - Abnormal posture R27.8 (ICD-10-CM) - Other lack of coordination G20.A2 (ICD-10-CM) - Parkinson's disease with fluctuating manifestations, unspecified whether dyskinesia present (HCC)  THERAPY DIAG:  No diagnosis found.  Rationale for Evaluation and Treatment: Rehabilitation  SUBJECTIVE:  SUBJECTIVE STATEMENT: Pt reports no new complaints or issues.   Pt accompanied by: significant other  PERTINENT HISTORY: complex medical history of peptic ulcer disease with history of upper GI bleed, TIA, depression, degenerative disc disease in the neck, osteoarthritis, kidney stones, hyperlipidemia, hypertension, history of hiatal hernia, BPH, arthritis, and ankylosing spondylitis, and parkinsonism  PAIN:  Are you having pain? No worse for chronic pain, no acute pain  PRECAUTIONS: Fall  RED FLAGS: None   WEIGHT BEARING RESTRICTIONS: No  FALLS: Has patient fallen in last 3 months? Unsure as to number, notes fall/LOB every other week  LIVING ENVIRONMENT: Lives with: lives with their spouse Lives in: House/apartment Stairs: Ramp to enter, stairs to loft and basement, ground floor set-up Has following equipment at home: [name] - 2 wheeled  PLOF: Needs assistance with ADLs, Needs assistance with homemaking, and Needs assistance with gait  PATIENT GOALS:   OBJECTIVE:  TODAY'S TREATMENT: 04/27/24 Activity Comments  Seated: PWR!up 2x10 PWR!rock x10 PWR!twist x10 PWR!step x10 Diagonals x10 R&L Ant/post pelvic tilting x10  Use of scarves to exaggerate arm movement  Standing at counter: PWR!up to side step and reach for sticky notes on cabinets PWR!up and fwd step to dots with PT cueing Visual cues for pt to reach with UEs and visual cues using dots for  LEs to step on   Gait training with RW  Cues for bigger and faster steps Working on turning with less steps, min A for RW negotiation            TODAY'S TREATMENT: 04/20/24 Activity Comments  NU-step for pregait   Gait training -Stair ambulation and obstacles for step height HHA for stepping over, verbal cues for kick out for descending stairs -visual cue strategies for step length using targets to kick foot -counted steps for stride length/speed                 TODAY'S TREATMENT: 04/13/24 Activity Comments  NU-step for pre-gait 3 min warm-up 2 min forced fast pace therapist assist 10 min total     Standing in // bars: Sliding hands back on // bars as if performing modified row x10 Standing scap squeeze x10 Modified PWR! Step x10 using colored dots as targets   Modified PWR! Twist x10 using colored dots as targets Standing stepping on colored dots for left turn Bilat UE support on // bars  Amb with RW Cues for big steps and visual cueing with RTB across the front of RW -working on 180 turns L & R                Note: Objective measures were completed at Evaluation unless otherwise noted.  DIAGNOSTIC FINDINGS:   COGNITION: Overall cognitive status: Within functional limits for tasks assessed and some instances of difficulty attending to task/sequencing   SENSATION: Not tested  COORDINATION: Deficits to rapid alternating movements    MUSCLE TONE: rigidity  MUSCLE LENGTH: 10-15 degree knee flexion contracture bilat   POSTURE: rounded shoulders, forward head, and increased thoracic kyphosis  LOWER EXTREMITY ROM:     Right ankle ROM limits from fusion  LOWER EXTREMITY MMT:    4/5 gross BLE  BED MOBILITY:  Findings: NT, reports fluctuating performance  TRANSFERS: Sit to stand: Modified independence and CGA  Assistive device utilized: U-Step     Stand to sit: CGA  Assistive device utilized: u-step     Chair to chair: Modified independence and CGA   Assistive device utilized: U-step  CURB:  Findings: CGA-min A  STAIRS: Findings: Comments: CGA-min A GAIT: Findings: Distance walked: 150, Assistive device utilized:U-step, Level of assistance: CGA, and Comments: freezing in turns  FUNCTIONAL TESTS:  5 times sit to stand: 47.25 sec Timed up and go (TUG): 36 sec w/ U-step 10 meter walk test: 23.34 sec = 1.4 ft/sec w/ U-step Lars Balance Scale:  180 degree turns: 16-18 steps  Item Test date: 01/20/24 Test date:  Test date:   Sitting to standing 2. able to stand using hands after several tries Insert OPRCBERGREEVAL SmartPhrase at re-test date Insert OPRCBERGREEVAL SmartPhrase at re-test date  2. Standing unsupported 3. able to stand 2 minutes with supervision    3. Sitting with back unsupported, feet supported 4. able to sit safely and securely for 2 minutes    4. Standing to sitting 2. uses back of legs against chair to control descent    5. Pivot transfer  2. able to transfer with verbal cuing and/or supervision    6. Standing unsupported with eyes closed 3. able to stand 10 seconds with supervision    7. Standing unsupported with feet together 3. able to place feet together independently and stand 1 minute with supervision    8. Reaching forward with outstretched arms while standing 3. can reach forward 12 cm (5 inches)    9. Pick up object from the floor from standing 0. unable to try/needs assistance to keep from losing balance or falling    10. Turning to look behind over left and right shoulders while standing 2. turns sideways but only maintains balance    11. Turn 360 degrees 0. needs assistance while turning    12. Place alternate foot on step or stool while standing unsupported 1. able to complete > 2 steps needs minimal assist    13. Standing unsupported one foot in front 0. loses balance while stepping or standing    14. Standing on one leg 0. unable to try of needs assist to prevent fall      Total Score 25/56 Total  Score /56 Total Score /56     PATIENT SURVEYS:  Freezing of gait questionnaire provided to fill out and return                                                                                                                              TREATMENT DATE:     PATIENT EDUCATION:a Education details: assessment details, rationale of tx interventions Person educated: Patient and Spouse Education method: Explanation Education comprehension: verbalized understanding  HOME EXERCISE PROGRAM: TBD  GOALS: Goals reviewed with patient? Yes  SHORT TERM GOALS: Target date: 02/17/2024    Patient will perform HEP with family/caregiver supervision for improved strength, balance, transfers, and gait  Baseline: Goal status: MET  2.  Demo improved postural control and BLE strength per time 35 sec 5xSTS test Baseline: 47 sec; 37 sec Goal status: NOT MET    LONG TERM  GOALS: Target date: 05/04/24    Demo improved static strength and safety with ADL per score 40/56 Berg Balance Test Baseline: 25/56; 27/56 Goal status: IN PROGRESS 03/16/24  2.  Demo improved safety/efficiency of turning negotiating 180 degree turn in 10-12 steps Baseline: 16-18 w/ U-step; 20-22 w/ U-step; 12-18 steps given min A for RW negotiation 04/13/24 Goal status: IN PROGRESS  3.  Demo improved safety with mobility per time 25 sec TUG test Baseline: 36 sec w/ U-step; 1 min 30 sec w/ U-step;  Goal status: IN PROGRESS  4.  Spouse to teach-back strategies to help with reducing freezing of gait Baseline:  Goal status: IMET   ASSESSMENT:  CLINICAL IMPRESSION: Will need to check goals next session. Pt comes in very rigid this afternoon with very minimal step clearance. Session focused on PWR! Moves to improve rigidity and improve trunk/hip mobility. Continued to work on stepping and weight shifting.    OBJECTIVE IMPAIRMENTS: Abnormal gait, decreased activity tolerance, decreased balance, decreased coordination, decreased  mobility, difficulty walking, decreased strength, improper body mechanics, and postural dysfunction.    PLAN:  PT FREQUENCY: 1x/week  PT DURATION: 8 weeks  PLANNED INTERVENTIONS: 97750- Physical Performance Testing, 97110-Therapeutic exercises, 97530- Therapeutic activity, [id number]- Neuromuscular re-education, 97535- Self Care, 02859- Manual therapy, [phone]- Gait training, and [phone]- Aquatic Therapy  PLAN FOR NEXT SESSION: auditory cues for gait for outdoors ambulation to enable walking at park   2:47 PM, 04/27/24 Chonita Gadea April Ma L Joliana Claflin, PT, DPT Physical Therapist- [place] Office Number: [phone]

## 2024-04-29 DIAGNOSIS — M79674 Pain in right toe(s): Secondary | ICD-10-CM | POA: Diagnosis not present

## 2024-04-29 DIAGNOSIS — B351 Tinea unguium: Secondary | ICD-10-CM | POA: Diagnosis not present

## 2024-04-29 DIAGNOSIS — M79675 Pain in left toe(s): Secondary | ICD-10-CM | POA: Diagnosis not present

## 2024-05-03 NOTE — Therapy (Signed)
 OUTPATIENT PHYSICAL THERAPY NEURO TREATMENT  Patient Name: [name] MRN: [id number] DOB:[birth year], 88 y.o., male Today's Date: 05/03/2024   PCP: Yolande Toribio MATSU, MD REFERRING PROVIDER: Buck Saucer, MD      END OF SESSION:       Past Medical History:  Diagnosis Date   Ankylosing spondylitis (HCC) dx'd ~ 1974   Arthritis    back (08/10/2015)   BENIGN PROSTATIC HYPERTROPHY, WITH OBSTRUCTION 01/15/2010   Bleeding duodenal ulcer    Bleeding esophageal ulcer    Bleeding stomach ulcer    GERD 02/22/2008   History of blood transfusion 08/26/1986   lost ~ 1/2 of my blood volume from multiple bleeding ulcers   History of hiatal hernia    HYPERGLYCEMIA 11/18/2007   HYPERLIPIDEMIA 02/22/2008   HYPERTENSION, UNSPECIFIED 11/10/2009   Kidney stones    Osteoarthritis    c-spine   Parkinson's disease (HCC)    PEPTIC ULCER DISEASE 11/17/2007   Situational depression    son died in MVA 2015-06-13   TIA (transient ischemic attack)    not that I know of in the past; they are trying to determine if I've had one today (08/10/2015)   Past Surgical History:  Procedure Laterality Date   CATARACT EXTRACTION W/ INTRAOCULAR LENS  IMPLANT, BILATERAL Bilateral 2013   Patient Active Problem List   Diagnosis Date Noted   Ankylosing spondylitis of cervicothoracic region (HCC) 10/16/2016   TIA (transient ischemic attack) 08/10/2015   Carotid stenosis 08/04/2014   Hyperlipidemia 02/21/2012   BENIGN PROSTATIC HYPERTROPHY, WITH OBSTRUCTION 01/15/2010   Essential hypertension 11/10/2009   GERD 02/22/2008   NEPHROLITHIASIS, HX OF 11/17/2007    ONSET DATE: PD x years onset  REFERRING DIAG: R26.81 (ICD-10-CM) - Unsteadiness on feet R29.3 (ICD-10-CM) - Abnormal posture R27.8 (ICD-10-CM) - Other lack of coordination G20.A2 (ICD-10-CM) - Parkinson's disease with fluctuating manifestations, unspecified whether dyskinesia present (HCC)  THERAPY DIAG:  No diagnosis found.  Rationale  for Evaluation and Treatment: Rehabilitation  SUBJECTIVE:                                                                                                                                                                                             SUBJECTIVE STATEMENT: Pt reports no new complaints or issues.   Pt accompanied by: significant other  PERTINENT HISTORY: complex medical history of peptic ulcer disease with history of upper GI bleed, TIA, depression, degenerative disc disease in the neck, osteoarthritis, kidney stones, hyperlipidemia, hypertension, history of hiatal hernia, BPH, arthritis, and ankylosing spondylitis, and parkinsonism  PAIN:  Are you having pain? No worse for chronic pain, no acute  pain  PRECAUTIONS: Fall  RED FLAGS: None   WEIGHT BEARING RESTRICTIONS: No  FALLS: Has patient fallen in last 3 months? Unsure as to number, notes fall/LOB every other week  LIVING ENVIRONMENT: Lives with: lives with their spouse Lives in: House/apartment Stairs: Ramp to enter, stairs to loft and basement, ground floor set-up Has following equipment at home: [name] - 2 wheeled  PLOF: Needs assistance with ADLs, Needs assistance with homemaking, and Needs assistance with gait  PATIENT GOALS:   OBJECTIVE:     TODAY'S TREATMENT: 05/04/24 Activity Comments                          TODAY'S TREATMENT: 04/27/24 Activity Comments  Seated: PWR!up 2x10 PWR!rock x10 PWR!twist x10 PWR!step x10 Diagonals x10 R&L Ant/post pelvic tilting x10  Use of scarves to exaggerate arm movement  Standing at counter: PWR!up to side step and reach for sticky notes on cabinets PWR!up and fwd step to dots with PT cueing Visual cues for pt to reach with UEs and visual cues using dots for LEs to step on   Gait training with RW  Cues for bigger and faster steps Working on turning with less steps, min A for RW negotiation            TODAY'S TREATMENT: 04/20/24 Activity Comments   NU-step for pregait   Gait training -Stair ambulation and obstacles for step height HHA for stepping over, verbal cues for kick out for descending stairs -visual cue strategies for step length using targets to kick foot -counted steps for stride length/speed                 TODAY'S TREATMENT: 04/13/24 Activity Comments  NU-step for pre-gait 3 min warm-up 2 min forced fast pace therapist assist 10 min total     Standing in // bars: Sliding hands back on // bars as if performing modified row x10 Standing scap squeeze x10 Modified PWR! Step x10 using colored dots as targets   Modified PWR! Twist x10 using colored dots as targets Standing stepping on colored dots for left turn Bilat UE support on // bars  Amb with RW Cues for big steps and visual cueing with RTB across the front of RW -working on 180 turns L & R                Note: Objective measures were completed at Evaluation unless otherwise noted.  DIAGNOSTIC FINDINGS:   COGNITION: Overall cognitive status: Within functional limits for tasks assessed and some instances of difficulty attending to task/sequencing   SENSATION: Not tested  COORDINATION: Deficits to rapid alternating movements    MUSCLE TONE: rigidity  MUSCLE LENGTH: 10-15 degree knee flexion contracture bilat   POSTURE: rounded shoulders, forward head, and increased thoracic kyphosis  LOWER EXTREMITY ROM:     Right ankle ROM limits from fusion  LOWER EXTREMITY MMT:    4/5 gross BLE  BED MOBILITY:  Findings: NT, reports fluctuating performance  TRANSFERS: Sit to stand: Modified independence and CGA  Assistive device utilized: U-Step     Stand to sit: CGA  Assistive device utilized: u-step     Chair to chair: Modified independence and CGA  Assistive device utilized: U-step         CURB:  Findings: CGA-min A  STAIRS: Findings: Comments: CGA-min A GAIT: Findings: Distance walked: 150, Assistive device utilized:U-step, Level of  assistance: CGA, and Comments: freezing in turns  FUNCTIONAL TESTS:  5 times sit to stand: 47.25 sec Timed up and go (TUG): 36 sec w/ U-step 10 meter walk test: 23.34 sec = 1.4 ft/sec w/ U-step Lars Balance Scale:  180 degree turns: 16-18 steps  Item Test date: 01/20/24 Test date:  Test date:   Sitting to standing 2. able to stand using hands after several tries Insert OPRCBERGREEVAL SmartPhrase at re-test date Insert OPRCBERGREEVAL SmartPhrase at re-test date  2. Standing unsupported 3. able to stand 2 minutes with supervision    3. Sitting with back unsupported, feet supported 4. able to sit safely and securely for 2 minutes    4. Standing to sitting 2. uses back of legs against chair to control descent    5. Pivot transfer  2. able to transfer with verbal cuing and/or supervision    6. Standing unsupported with eyes closed 3. able to stand 10 seconds with supervision    7. Standing unsupported with feet together 3. able to place feet together independently and stand 1 minute with supervision    8. Reaching forward with outstretched arms while standing 3. can reach forward 12 cm (5 inches)    9. Pick up object from the floor from standing 0. unable to try/needs assistance to keep from losing balance or falling    10. Turning to look behind over left and right shoulders while standing 2. turns sideways but only maintains balance    11. Turn 360 degrees 0. needs assistance while turning    12. Place alternate foot on step or stool while standing unsupported 1. able to complete > 2 steps needs minimal assist    13. Standing unsupported one foot in front 0. loses balance while stepping or standing    14. Standing on one leg 0. unable to try of needs assist to prevent fall      Total Score 25/56 Total Score /56 Total Score /56     PATIENT SURVEYS:  Freezing of gait questionnaire provided to fill out and return                                                                                                                               TREATMENT DATE:     PATIENT EDUCATION:a Education details: assessment details, rationale of tx interventions Person educated: Patient and Spouse Education method: Explanation Education comprehension: verbalized understanding  HOME EXERCISE PROGRAM: TBD  GOALS: Goals reviewed with patient? Yes  SHORT TERM GOALS: Target date: 02/17/2024    Patient will perform HEP with family/caregiver supervision for improved strength, balance, transfers, and gait  Baseline: Goal status: MET  2.  Demo improved postural control and BLE strength per time 35 sec 5xSTS test Baseline: 47 sec; 37 sec Goal status: NOT MET    LONG TERM GOALS: Target date: 05/04/24    Demo improved static strength and safety with ADL per score 40/56 Berg Balance Test Baseline: 25/56; 27/56 Goal status: IN PROGRESS 03/16/24  2.  Demo improved safety/efficiency of turning negotiating 180 degree turn in 10-12 steps Baseline: 16-18 w/ U-step; 20-22 w/ U-step; 12-18 steps given min A for RW negotiation 04/13/24 Goal status: IN PROGRESS  3.  Demo improved safety with mobility per time 25 sec TUG test Baseline: 36 sec w/ U-step; 1 min 30 sec w/ U-step;  Goal status: IN PROGRESS  4.  Spouse to teach-back strategies to help with reducing freezing of gait Baseline:  Goal status: IMET   ASSESSMENT:  CLINICAL IMPRESSION: Will need to check goals next session. Pt comes in very rigid this afternoon with very minimal step clearance. Session focused on PWR! Moves to improve rigidity and improve trunk/hip mobility. Continued to work on stepping and weight shifting.    OBJECTIVE IMPAIRMENTS: Abnormal gait, decreased activity tolerance, decreased balance, decreased coordination, decreased mobility, difficulty walking, decreased strength, improper body mechanics, and postural dysfunction.    PLAN:  PT FREQUENCY: 1x/week  PT DURATION: 8 weeks  PLANNED INTERVENTIONS: 97750-  Physical Performance Testing, 97110-Therapeutic exercises, 97530- Therapeutic activity, [id number]- Neuromuscular re-education, 97535- Self Care, 02859- Manual therapy, [phone]- Gait training, and [phone]- Aquatic Therapy  PLAN FOR NEXT SESSION: auditory cues for gait for outdoors ambulation to enable walking at park

## 2024-05-04 ENCOUNTER — Ambulatory Visit: Admitting: Physical Therapy

## 2024-05-04 ENCOUNTER — Encounter: Payer: Self-pay | Admitting: Physical Therapy

## 2024-05-04 DIAGNOSIS — R29818 Other symptoms and signs involving the nervous system: Secondary | ICD-10-CM

## 2024-05-04 DIAGNOSIS — G20A2 Parkinson's disease without dyskinesia, with fluctuations: Secondary | ICD-10-CM | POA: Diagnosis not present

## 2024-05-04 DIAGNOSIS — R2689 Other abnormalities of gait and mobility: Secondary | ICD-10-CM | POA: Diagnosis not present

## 2024-05-04 DIAGNOSIS — R2681 Unsteadiness on feet: Secondary | ICD-10-CM | POA: Diagnosis not present

## 2024-05-04 DIAGNOSIS — R293 Abnormal posture: Secondary | ICD-10-CM

## 2024-05-04 DIAGNOSIS — M6281 Muscle weakness (generalized): Secondary | ICD-10-CM | POA: Diagnosis not present

## 2024-05-06 ENCOUNTER — Telehealth: Payer: Self-pay | Admitting: Occupational Therapy

## 2024-05-06 DIAGNOSIS — G20A2 Parkinson's disease without dyskinesia, with fluctuations: Secondary | ICD-10-CM

## 2024-05-06 NOTE — Telephone Encounter (Signed)
 Dr. Buck, Upon discharge from Occupational therapy in July, patient, spouse, and therapist discussed return evaluation in ~3 months due to progressive nature of PD to aid in resources and support for ADLs, IADLs, and quality of life.  If you agree, please send referral via Epic for occupational therapy eval and treat.  Thank you.  KAYLENE DOMINO, OT  [facility] at [facility] [address] [place], [address] [place], [place] [address] Phone # [phone] Fax # [phone]

## 2024-05-11 ENCOUNTER — Ambulatory Visit: Admitting: Physical Therapy

## 2024-05-11 DIAGNOSIS — M6281 Muscle weakness (generalized): Secondary | ICD-10-CM

## 2024-05-11 DIAGNOSIS — R2689 Other abnormalities of gait and mobility: Secondary | ICD-10-CM

## 2024-05-11 DIAGNOSIS — R2681 Unsteadiness on feet: Secondary | ICD-10-CM

## 2024-05-11 DIAGNOSIS — G20A2 Parkinson's disease without dyskinesia, with fluctuations: Secondary | ICD-10-CM

## 2024-05-11 DIAGNOSIS — R29818 Other symptoms and signs involving the nervous system: Secondary | ICD-10-CM

## 2024-05-11 DIAGNOSIS — R262 Difficulty in walking, not elsewhere classified: Secondary | ICD-10-CM

## 2024-05-11 DIAGNOSIS — R293 Abnormal posture: Secondary | ICD-10-CM | POA: Diagnosis not present

## 2024-05-11 NOTE — Therapy (Signed)
 OUTPATIENT PHYSICAL THERAPY NEURO TREATMENT  Patient Name: [name] MRN: [id number] DOB:06-20-1936, 88 y.o., male Today's Date: 05/11/2024   PCP: Yolande Toribio MATSU, MD REFERRING PROVIDER: Buck Saucer, MD    END OF SESSION:  PT End of Session - 05/11/24 1529     Visit Number 14    Number of Visits 17    Date for PT Re-Evaluation 06/04/24    Authorization Type Medicare/[employer]    Progress Note Due on Visit 16    PT Start Time 1530    PT Stop Time 1610    PT Time Calculation (min) 40 min    Equipment Utilized During Treatment Gait belt    Activity Tolerance Patient tolerated treatment well    Behavior During Therapy WFL for tasks assessed/performed            Past Medical History:  Diagnosis Date   Ankylosing spondylitis (HCC) dx'd ~ 1974   Arthritis    back (08/10/2015)   BENIGN PROSTATIC HYPERTROPHY, WITH OBSTRUCTION 01/15/2010   Bleeding duodenal ulcer    Bleeding esophageal ulcer    Bleeding stomach ulcer    GERD 02/22/2008   History of blood transfusion 08/26/1986   lost ~ 1/2 of my blood volume from multiple bleeding ulcers   History of hiatal hernia    HYPERGLYCEMIA 11/18/2007   HYPERLIPIDEMIA 02/22/2008   HYPERTENSION, UNSPECIFIED 11/10/2009   Kidney stones    Osteoarthritis    c-spine   Parkinson's disease (HCC)    PEPTIC ULCER DISEASE 11/17/2007   Situational depression    son died in MVA Jul 08, 2015   TIA (transient ischemic attack)    not that I know of in the past; they are trying to determine if I've had one today (08/10/2015)   Past Surgical History:  Procedure Laterality Date   CATARACT EXTRACTION W/ INTRAOCULAR LENS  IMPLANT, BILATERAL Bilateral 2013   Patient Active Problem List   Diagnosis Date Noted   Ankylosing spondylitis of cervicothoracic region (HCC) 10/16/2016   TIA (transient ischemic attack) 08/10/2015   Carotid stenosis 08/04/2014   Hyperlipidemia 02/21/2012   BENIGN PROSTATIC HYPERTROPHY, WITH OBSTRUCTION  01/15/2010   Essential hypertension 11/10/2009   GERD 02/22/2008   NEPHROLITHIASIS, HX OF 11/17/2007    ONSET DATE: PD x years onset  REFERRING DIAG: R26.81 (ICD-10-CM) - Unsteadiness on feet R29.3 (ICD-10-CM) - Abnormal posture R27.8 (ICD-10-CM) - Other lack of coordination G20.A2 (ICD-10-CM) - Parkinson's disease with fluctuating manifestations, unspecified whether dyskinesia present (HCC)  THERAPY DIAG:  Parkinson's disease with fluctuating manifestations, unspecified whether dyskinesia present (HCC)  Other symptoms and signs involving the nervous system  Muscle weakness (generalized)  Unsteadiness on feet  Abnormal posture  Other abnormalities of gait and mobility  Difficulty in walking, not elsewhere classified  Rationale for Evaluation and Treatment: Rehabilitation  SUBJECTIVE:  SUBJECTIVE STATEMENT: Pt states his recliner chair flipped over. Wife states she wasn't present when it occurred.   Pt accompanied by: significant other  PERTINENT HISTORY: complex medical history of peptic ulcer disease with history of upper GI bleed, TIA, depression, degenerative disc disease in the neck, osteoarthritis, kidney stones, hyperlipidemia, hypertension, history of hiatal hernia, BPH, arthritis, and ankylosing spondylitis, and parkinsonism  PAIN:  Are you having pain? Not really  PRECAUTIONS: Fall  RED FLAGS: None   WEIGHT BEARING RESTRICTIONS: No  FALLS: Has patient fallen in last 3 months? Unsure as to number, notes fall/LOB every other week  LIVING ENVIRONMENT: Lives with: lives with their spouse Lives in: House/apartment Stairs: Ramp to enter, stairs to loft and basement, ground floor set-up Has following equipment at home: [name] - 2 wheeled  PLOF: Needs assistance with ADLs,  Needs assistance with homemaking, and Needs assistance with gait  PATIENT GOALS:   OBJECTIVE:     TODAY'S TREATMENT: 05/04/24 Activity Comments  Gait training into and out of session with U step 4x50' Cueing to step past red laser line to increase step length  360 degree turn with U step 15+ steps to complete   Weaving in/out of cones with U step Cues to step past red laser light. Cues to stop when shuffling/festination begins and reset  Figure 8 around cones with U step   Fwd step up on 2nd 4 step x10 with bilat UE support Fwd step up on 1st 4 step x10 with 1 hand held support Fwd step up on 1st 4 step x 10 without UE support   Nustep L4-5 x 6 min        PATIENT EDUCATION: Education details: exam findings, progress towards goals and remaining impairments, discussed activities that pt performs to keep active, discussed that pt ambulates best with U step and to plan to use this in-sessions Person educated: Patient and Spouse Education method: Explanation, Demonstration, Tactile cues, and Verbal cues Education comprehension: verbalized understanding and returned demonstration       Note: Objective measures were completed at Evaluation unless otherwise noted.  DIAGNOSTIC FINDINGS:   COGNITION: Overall cognitive status: Within functional limits for tasks assessed and some instances of difficulty attending to task/sequencing   SENSATION: Not tested  COORDINATION: Deficits to rapid alternating movements    MUSCLE TONE: rigidity  MUSCLE LENGTH: 10-15 degree knee flexion contracture bilat   POSTURE: rounded shoulders, forward head, and increased thoracic kyphosis  LOWER EXTREMITY ROM:     Right ankle ROM limits from fusion  LOWER EXTREMITY MMT:    4/5 gross BLE  BED MOBILITY:  Findings: NT, reports fluctuating performance  TRANSFERS: Sit to stand: Modified independence and CGA  Assistive device utilized: U-Step     Stand to sit: CGA  Assistive device  utilized: u-step     Chair to chair: Modified independence and CGA  Assistive device utilized: U-step         CURB:  Findings: CGA-min A  STAIRS: Findings: Comments: CGA-min A GAIT: Findings: Distance walked: 150, Assistive device utilized:U-step, Level of assistance: CGA, and Comments: freezing in turns  FUNCTIONAL TESTS:  5 times sit to stand: 47.25 sec Timed up and go (TUG): 36 sec w/ U-step 10 meter walk test: 23.34 sec = 1.4 ft/sec w/ U-step Berg Balance Scale:  180 degree turns: 16-18 steps  Item Test date: 01/20/24 Test date:  Test date:   Sitting to standing 2. able to stand using hands after several tries Insert OPRCBERGREEVAL SmartPhrase  at re-test date Insert OPRCBERGREEVAL SmartPhrase at re-test date  2. Standing unsupported 3. able to stand 2 minutes with supervision    3. Sitting with back unsupported, feet supported 4. able to sit safely and securely for 2 minutes    4. Standing to sitting 2. uses back of legs against chair to control descent    5. Pivot transfer  2. able to transfer with verbal cuing and/or supervision    6. Standing unsupported with eyes closed 3. able to stand 10 seconds with supervision    7. Standing unsupported with feet together 3. able to place feet together independently and stand 1 minute with supervision    8. Reaching forward with outstretched arms while standing 3. can reach forward 12 cm (5 inches)    9. Pick up object from the floor from standing 0. unable to try/needs assistance to keep from losing balance or falling    10. Turning to look behind over left and right shoulders while standing 2. turns sideways but only maintains balance    11. Turn 360 degrees 0. needs assistance while turning    12. Place alternate foot on step or stool while standing unsupported 1. able to complete > 2 steps needs minimal assist    13. Standing unsupported one foot in front 0. loses balance while stepping or standing    14. Standing on one leg 0. unable  to try of needs assist to prevent fall      Total Score 25/56 Total Score /56 Total Score /56     PATIENT SURVEYS:  Freezing of gait questionnaire provided to fill out and return                                                                                                                              TREATMENT DATE:     PATIENT EDUCATION:a Education details: assessment details, rationale of tx interventions Person educated: Patient and Spouse Education method: Explanation Education comprehension: verbalized understanding  HOME EXERCISE PROGRAM: TBD  GOALS: Goals reviewed with patient? Yes  SHORT TERM GOALS: Target date: 02/17/2024    Patient will perform HEP with family/caregiver supervision for improved strength, balance, transfers, and gait  Baseline: Goal status: MET  2.  Demo improved postural control and BLE strength per time 35 sec 5xSTS test Baseline: 47 sec; 37 sec Goal status: NOT MET    LONG TERM GOALS: Target date: 06/04/24    Demo improved static strength and safety with ADL per score 40/56 Berg Balance Test Baseline: 25/56; 27/56; 29/56 05/04/24 Goal status: IN PROGRESS 05/04/24  2.  Demo improved safety/efficiency of turning negotiating 180 degree turn in 10-12 steps Baseline: 16-18 w/ U-step; 20-22 w/ U-step; 12-18 steps given min A for RW negotiation 04/13/24; 20+ steps and min A to prevent posterior LOB 05/04/24 Goal status: IN PROGRESS 05/04/24  3.  Demo improved safety with mobility per time 25 sec TUG test Baseline: 36 sec w/  U-step; 1 min 30 sec w/ U-step; 1 min 10 sec with RW and verbal cueing for safe turning/navigation 05/04/24 Goal status: IN PROGRESS 05/04/24  4.  Spouse to teach-back strategies to help with reducing freezing of gait Baseline:  Goal status: MET   ASSESSMENT:  CLINICAL IMPRESSION: Continued to practice ambulating with U step. Worked on obstacle negotiation, stepping, and turning with pt demonstrating improved safety with U step  and improved stepping given visual cues from laser. Will try and work on outdoor amb with audio cues next session.    OBJECTIVE IMPAIRMENTS: Abnormal gait, decreased activity tolerance, decreased balance, decreased coordination, decreased mobility, difficulty walking, decreased strength, improper body mechanics, and postural dysfunction.    PLAN:  PT FREQUENCY: 1x/week  PT DURATION: 4 weeks  PLANNED INTERVENTIONS: 97750- Physical Performance Testing, 97110-Therapeutic exercises, 97530- Therapeutic activity, [id number]- Neuromuscular re-education, 97535- Self Care, 02859- Manual therapy, [id number]- Gait training, and [phone]- Aquatic Therapy  PLAN FOR NEXT SESSION: patient moves best with U-step which they have at home. Make sure to use U-step in sessions! auditory cues for gait for outdoors ambulation to enable walking at park  Willine Schwalbe April Ma L [place], [place], DPT 05/11/24 3:29 PM  [facility] at [facility] [address] [place], [address] [place], [place] [address] Phone # [phone] Fax # [phone]

## 2024-05-13 DIAGNOSIS — M459 Ankylosing spondylitis of unspecified sites in spine: Secondary | ICD-10-CM | POA: Diagnosis not present

## 2024-05-13 DIAGNOSIS — Z79899 Other long term (current) drug therapy: Secondary | ICD-10-CM | POA: Diagnosis not present

## 2024-05-17 ENCOUNTER — Encounter: Payer: Self-pay | Admitting: Physical Therapy

## 2024-05-17 ENCOUNTER — Ambulatory Visit: Admitting: Physical Therapy

## 2024-05-17 DIAGNOSIS — G20A2 Parkinson's disease without dyskinesia, with fluctuations: Secondary | ICD-10-CM | POA: Diagnosis not present

## 2024-05-17 DIAGNOSIS — R2681 Unsteadiness on feet: Secondary | ICD-10-CM | POA: Diagnosis not present

## 2024-05-17 DIAGNOSIS — R2689 Other abnormalities of gait and mobility: Secondary | ICD-10-CM

## 2024-05-17 DIAGNOSIS — M6281 Muscle weakness (generalized): Secondary | ICD-10-CM

## 2024-05-17 DIAGNOSIS — R29818 Other symptoms and signs involving the nervous system: Secondary | ICD-10-CM | POA: Diagnosis not present

## 2024-05-17 DIAGNOSIS — R293 Abnormal posture: Secondary | ICD-10-CM | POA: Diagnosis not present

## 2024-05-17 NOTE — Therapy (Signed)
 OUTPATIENT PHYSICAL THERAPY NEURO TREATMENT  Patient Name: [name] MRN: [id number] DOB:1935/10/11, 88 y.o., male Today's Date: 05/17/2024   PCP: Yolande Toribio MATSU, MD REFERRING PROVIDER: Buck Saucer, MD    END OF SESSION:  PT End of Session - 05/17/24 1536     Visit Number 15    Number of Visits 17    Date for Recertification  06/04/24    Authorization Type Medicare/AARP    Progress Note Due on Visit 16    PT Start Time 1533    PT Stop Time 1614    PT Time Calculation (min) 41 min    Equipment Utilized During Treatment Gait belt    Activity Tolerance Patient tolerated treatment well    Behavior During Therapy WFL for tasks assessed/performed             Past Medical History:  Diagnosis Date   Ankylosing spondylitis (HCC) dx'd ~ 1974   Arthritis    back (08/10/2015)   BENIGN PROSTATIC HYPERTROPHY, WITH OBSTRUCTION 01/15/2010   Bleeding duodenal ulcer    Bleeding esophageal ulcer    Bleeding stomach ulcer    GERD 02/22/2008   History of blood transfusion 08/26/1986   lost ~ 1/2 of my blood volume from multiple bleeding ulcers   History of hiatal hernia    HYPERGLYCEMIA 11/18/2007   HYPERLIPIDEMIA 02/22/2008   HYPERTENSION, UNSPECIFIED 11/10/2009   Kidney stones    Osteoarthritis    c-spine   Parkinson's disease (HCC)    PEPTIC ULCER DISEASE 11/17/2007   Situational depression    son died in MVA 13-Jul-2015   TIA (transient ischemic attack)    not that I know of in the past; they are trying to determine if I've had one today (08/10/2015)   Past Surgical History:  Procedure Laterality Date   CATARACT EXTRACTION W/ INTRAOCULAR LENS  IMPLANT, BILATERAL Bilateral 2013   Patient Active Problem List   Diagnosis Date Noted   Ankylosing spondylitis of cervicothoracic region (HCC) 10/16/2016   TIA (transient ischemic attack) 08/10/2015   Carotid stenosis 08/04/2014   Hyperlipidemia 02/21/2012   BENIGN PROSTATIC HYPERTROPHY, WITH OBSTRUCTION  01/15/2010   Essential hypertension 11/10/2009   GERD 02/22/2008   NEPHROLITHIASIS, HX OF 11/17/2007    ONSET DATE: PD x years onset  REFERRING DIAG: R26.81 (ICD-10-CM) - Unsteadiness on feet R29.3 (ICD-10-CM) - Abnormal posture R27.8 (ICD-10-CM) - Other lack of coordination G20.A2 (ICD-10-CM) - Parkinson's disease with fluctuating manifestations, unspecified whether dyskinesia present (HCC)  THERAPY DIAG:  Muscle weakness (generalized)  Unsteadiness on feet  Other abnormalities of gait and mobility  Rationale for Evaluation and Treatment: Rehabilitation  SUBJECTIVE:  SUBJECTIVE STATEMENT: No additional falls since recliner incident.  Try to follow Lynn's instructions.  Have gotten to gym once; have tried the U-step at the [facility] for short distances. Pt reports as first report that he thinks he has broken toe (3rd toe) on left foot and sometimes it bothers him.  He notes that he hasn't talked to MD about it. Pt accompanied by: significant other  PERTINENT HISTORY: complex medical history of peptic ulcer disease with history of upper GI bleed, TIA, depression, degenerative disc disease in the neck, osteoarthritis, kidney stones, hyperlipidemia, hypertension, history of hiatal hernia, BPH, arthritis, and ankylosing spondylitis, and parkinsonism  PAIN:  Are you having pain? Not really  PRECAUTIONS: Fall  RED FLAGS: None   WEIGHT BEARING RESTRICTIONS: No  FALLS: Has patient fallen in last 3 months? Unsure as to number, notes fall/LOB every other week  LIVING ENVIRONMENT: Lives with: lives with their spouse Lives in: House/apartment Stairs: Ramp to enter, stairs to loft and basement, ground floor set-up Has following equipment at home: [name] - 2 wheeled  PLOF: Needs assistance with  ADLs, Needs assistance with homemaking, and Needs assistance with gait  PATIENT GOALS:   OBJECTIVE:    TODAY'S TREATMENT: 05/17/2024 Activity Comments  NuStep, Level 4, x 8 minutes Cues for full extension of BLEs, slows pace with conversation  Gait training with U-step RW, 50 ft x 4 reps Cues to step past laser light line Cues to stop and reset if festination starts  Gait with U-step RW, 85 ft  Cues to step past laser light line  Marching in place 2 x 10 reps   Marching in place with 180 turn with U-step RW With assist and cues  Gait training with figure-8 obstacle negotiation, using U-step RW Assist for tighter turns and cues to keep feet to laser line     TODAY'S TREATMENT: 05/04/24 Activity Comments  Gait training into and out of session with U step 4x50' Cueing to step past red laser line to increase step length  360 degree turn with U step 15+ steps to complete   Weaving in/out of cones with U step Cues to step past red laser light. Cues to stop when shuffling/festination begins and reset  Figure 8 around cones with U step   Fwd step up on 2nd 4 step x10 with bilat UE support Fwd step up on 1st 4 step x10 with 1 hand held support Fwd step up on 1st 4 step x 10 without UE support   Nustep L4-5 x 6 min        PATIENT EDUCATION: Education details:05/17/2024:  Reiterating staying close to U-step, inside BOS of walker; pt does report near end of session some L 3rd toe pain and wonders if it is broken-discussed with pt/wife to further monitor and seek MD if needed Person educated: Patient and Spouse Education method: Explanation, Demonstration, Tactile cues, and Verbal cues Education comprehension: verbalized understanding and returned demonstration       Note: Objective measures were completed at Evaluation unless otherwise noted.  DIAGNOSTIC FINDINGS:   COGNITION: Overall cognitive status: Within functional limits for tasks assessed and some instances of difficulty  attending to task/sequencing   SENSATION: Not tested  COORDINATION: Deficits to rapid alternating movements    MUSCLE TONE: rigidity  MUSCLE LENGTH: 10-15 degree knee flexion contracture bilat   POSTURE: rounded shoulders, forward head, and increased thoracic kyphosis  LOWER EXTREMITY ROM:     Right ankle ROM limits from fusion  LOWER  EXTREMITY MMT:    4/5 gross BLE  BED MOBILITY:  Findings: NT, reports fluctuating performance  TRANSFERS: Sit to stand: Modified independence and CGA  Assistive device utilized: U-Step     Stand to sit: CGA  Assistive device utilized: u-step     Chair to chair: Modified independence and CGA  Assistive device utilized: U-step         CURB:  Findings: CGA-min A  STAIRS: Findings: Comments: CGA-min A GAIT: Findings: Distance walked: 150, Assistive device utilized:U-step, Level of assistance: CGA, and Comments: freezing in turns  FUNCTIONAL TESTS:  5 times sit to stand: 47.25 sec Timed up and go (TUG): 36 sec w/ U-step 10 meter walk test: 23.34 sec = 1.4 ft/sec w/ U-step Berg Balance Scale:  180 degree turns: 16-18 steps  Item Test date: 01/20/24 Test date:  Test date:   Sitting to standing 2. able to stand using hands after several tries Insert OPRCBERGREEVAL SmartPhrase at re-test date Insert OPRCBERGREEVAL SmartPhrase at re-test date  2. Standing unsupported 3. able to stand 2 minutes with supervision    3. Sitting with back unsupported, feet supported 4. able to sit safely and securely for 2 minutes    4. Standing to sitting 2. uses back of legs against chair to control descent    5. Pivot transfer  2. able to transfer with verbal cuing and/or supervision    6. Standing unsupported with eyes closed 3. able to stand 10 seconds with supervision    7. Standing unsupported with feet together 3. able to place feet together independently and stand 1 minute with supervision    8. Reaching forward with outstretched arms while standing  3. can reach forward 12 cm (5 inches)    9. Pick up object from the floor from standing 0. unable to try/needs assistance to keep from losing balance or falling    10. Turning to look behind over left and right shoulders while standing 2. turns sideways but only maintains balance    11. Turn 360 degrees 0. needs assistance while turning    12. Place alternate foot on step or stool while standing unsupported 1. able to complete > 2 steps needs minimal assist    13. Standing unsupported one foot in front 0. loses balance while stepping or standing    14. Standing on one leg 0. unable to try of needs assist to prevent fall      Total Score 25/56 Total Score /56 Total Score /56     PATIENT SURVEYS:  Freezing of gait questionnaire provided to fill out and return                                                                                                                              TREATMENT DATE:     PATIENT EDUCATION:a Education details: assessment details, rationale of tx interventions Person educated: Patient and Spouse Education method: Explanation Education comprehension: verbalized understanding  HOME EXERCISE  PROGRAM: TBD  GOALS: Goals reviewed with patient? Yes  SHORT TERM GOALS: Target date: 02/17/2024    Patient will perform HEP with family/caregiver supervision for improved strength, balance, transfers, and gait  Baseline: Goal status: MET  2.  Demo improved postural control and BLE strength per time 35 sec 5xSTS test Baseline: 47 sec; 37 sec Goal status: NOT MET    LONG TERM GOALS: Target date: 06/04/24    Demo improved static strength and safety with ADL per score 40/56 Berg Balance Test Baseline: 25/56; 27/56; 29/56 05/04/24 Goal status: IN PROGRESS 05/04/24  2.  Demo improved safety/efficiency of turning negotiating 180 degree turn in 10-12 steps Baseline: 16-18 w/ U-step; 20-22 w/ U-step; 12-18 steps given min A for RW negotiation 04/13/24; 20+ steps and min  A to prevent posterior LOB 05/04/24 Goal status: IN PROGRESS 05/04/24  3.  Demo improved safety with mobility per time 25 sec TUG test Baseline: 36 sec w/ U-step; 1 min 30 sec w/ U-step; 1 min 10 sec with RW and verbal cueing for safe turning/navigation 05/04/24 Goal status: IN PROGRESS 05/04/24  4.  Spouse to teach-back strategies to help with reducing freezing of gait Baseline:  Goal status: MET   ASSESSMENT:  CLINICAL IMPRESSION: Pt presents today initially with no complaints; does report some L 3rd toe pain after gait activities and thinks he may have a broken toe (he hasn't mentioned to wife until now and does not indicate this was due to a fall).  Educated pt/wife to monitor and to consult MD if pain worsens or impacts gait more negatively.  Otherwise, focused on gait and standing activities today to help with turns and changes of direction.  He continues to need cues and some stop/reset breaks to keep larger amplitude of foot clearance with gait and turns.  Pt will continue to benefit from skilled PT towards goals for improved functional mobility and decreased fall risk.  OBJECTIVE IMPAIRMENTS: Abnormal gait, decreased activity tolerance, decreased balance, decreased coordination, decreased mobility, difficulty walking, decreased strength, improper body mechanics, and postural dysfunction.    PLAN:  PT FREQUENCY: 1x/week  PT DURATION: 4 weeks  PLANNED INTERVENTIONS: 97750- Physical Performance Testing, 97110-Therapeutic exercises, 97530- Therapeutic activity, [id number]- Neuromuscular re-education, 97535- Self Care, 02859- Manual therapy, [phone]- Gait training, and [phone]- Aquatic Therapy  PLAN FOR NEXT SESSION: Outdoor gait, try auditory cues.  (Per end of session note, next visit is PN at visit 16).  patient moves best with U-step which they have at home. Make sure to use U-step in sessions!   Greig Anon, PT 05/17/24 7:57 PM Phone: [phone] Fax: [phone]  [facility] at [facility] [address] [place], [place] [address] Phone # [phone] Fax # [phone]

## 2024-05-20 DIAGNOSIS — Z6825 Body mass index (BMI) 25.0-25.9, adult: Secondary | ICD-10-CM | POA: Diagnosis not present

## 2024-05-20 DIAGNOSIS — Z79899 Other long term (current) drug therapy: Secondary | ICD-10-CM | POA: Diagnosis not present

## 2024-05-20 DIAGNOSIS — G20A1 Parkinson's disease without dyskinesia, without mention of fluctuations: Secondary | ICD-10-CM | POA: Diagnosis not present

## 2024-05-20 DIAGNOSIS — M1991 Primary osteoarthritis, unspecified site: Secondary | ICD-10-CM | POA: Diagnosis not present

## 2024-05-20 DIAGNOSIS — N182 Chronic kidney disease, stage 2 (mild): Secondary | ICD-10-CM | POA: Diagnosis not present

## 2024-05-20 DIAGNOSIS — M459 Ankylosing spondylitis of unspecified sites in spine: Secondary | ICD-10-CM | POA: Diagnosis not present

## 2024-05-20 DIAGNOSIS — E663 Overweight: Secondary | ICD-10-CM | POA: Diagnosis not present

## 2024-05-21 NOTE — Therapy (Signed)
 OUTPATIENT PHYSICAL THERAPY NEURO TREATMENT  Patient Name: [name] MRN: [id number] DOB:08/31/1935, 88 y.o., male Today's Date: 05/24/2024   PCP: Yolande Toribio MATSU, MD REFERRING PROVIDER: Buck Saucer, MD    END OF SESSION:  PT End of Session - 05/24/24 1616     Visit Number 16    Number of Visits 17    Date for Recertification  06/04/24    Authorization Type Medicare/AARP    Progress Note Due on Visit 16    PT Start Time June 26, 1531    PT Stop Time 1614    PT Time Calculation (min) 42 min    Equipment Utilized During Treatment Gait belt    Activity Tolerance Patient tolerated treatment well    Behavior During Therapy WFL for tasks assessed/performed              Past Medical History:  Diagnosis Date   Ankylosing spondylitis (HCC) dx'd ~ 06-25-1973   Arthritis    back (08/10/2015)   BENIGN PROSTATIC HYPERTROPHY, WITH OBSTRUCTION 01/15/2010   Bleeding duodenal ulcer    Bleeding esophageal ulcer    Bleeding stomach ulcer    GERD 02/22/2008   History of blood transfusion 08/26/1986   lost ~ 1/2 of my blood volume from multiple bleeding ulcers   History of hiatal hernia    HYPERGLYCEMIA 11/18/2007   HYPERLIPIDEMIA 02/22/2008   HYPERTENSION, UNSPECIFIED 11/10/2009   Kidney stones    Osteoarthritis    c-spine   Parkinson's disease (HCC)    PEPTIC ULCER DISEASE 11/17/2007   Situational depression    son died in MVA 06-26-15   TIA (transient ischemic attack)    not that I know of in the past; they are trying to determine if I've had one today (08/10/2015)   Past Surgical History:  Procedure Laterality Date   CATARACT EXTRACTION W/ INTRAOCULAR LENS  IMPLANT, BILATERAL Bilateral June 25, 2012   Patient Active Problem List   Diagnosis Date Noted   Ankylosing spondylitis of cervicothoracic region (HCC) 10/16/2016   TIA (transient ischemic attack) 08/10/2015   Carotid stenosis 08/04/2014   Hyperlipidemia 02/21/2012   BENIGN PROSTATIC HYPERTROPHY, WITH OBSTRUCTION  01/15/2010   Essential hypertension 11/10/2009   GERD 02/22/2008   NEPHROLITHIASIS, HX OF 11/17/2007    ONSET DATE: PD x years onset  REFERRING DIAG: R26.81 (ICD-10-CM) - Unsteadiness on feet R29.3 (ICD-10-CM) - Abnormal posture R27.8 (ICD-10-CM) - Other lack of coordination G20.A2 (ICD-10-CM) - Parkinson's disease with fluctuating manifestations, unspecified whether dyskinesia present (HCC)  THERAPY DIAG:  Muscle weakness (generalized)  Unsteadiness on feet  Other abnormalities of gait and mobility  Rationale for Evaluation and Treatment: Rehabilitation  SUBJECTIVE:  SUBJECTIVE STATEMENT: Reports a fell on the floor while trying to make sandwiches on Friday. Reports that he hit his head- denies lumps or bruises. Denies dizziness, diplopia, HA.   Pt accompanied by: significant other  PERTINENT HISTORY: complex medical history of peptic ulcer disease with history of upper GI bleed, TIA, depression, degenerative disc disease in the neck, osteoarthritis, kidney stones, hyperlipidemia, hypertension, history of hiatal hernia, BPH, arthritis, and ankylosing spondylitis, and parkinsonism  PAIN:  Are you having pain? Not really  PRECAUTIONS: Fall  RED FLAGS: None   WEIGHT BEARING RESTRICTIONS: No  FALLS: Has patient fallen in last 3 months? Unsure as to number, notes fall/LOB every other week  LIVING ENVIRONMENT: Lives with: lives with their spouse Lives in: House/apartment Stairs: Ramp to enter, stairs to loft and basement, ground floor set-up Has following equipment at home: [name] - 2 wheeled  PLOF: Needs assistance with ADLs, Needs assistance with homemaking, and Needs assistance with gait  PATIENT GOALS:   OBJECTIVE:     TODAY'S TREATMENT: 05/21/24 Activity Comments  Gait  training into/out of clinic with RW Cueing to kick and step and keep eyes ahead to mitigate freezing   standing PWR moves in II bars: up rock Twist (modified with cross body punch instead) step Use of mirror feedback. Manual cueing for large amplitude stretch with arms. Use of manual feedback to increase lateral wt shift with rock. Pt with difficulty completing LE pivot necessary to complete PWR twist, thus performed cross body punch instead. Use of stomp and visual targets on floor for PWR step  standing trunk twist with ball Cues to stop and reset posture and balance in midline; tendency for LOB with L>R rotation      PATIENT EDUCATION: Education details: discussed using U step when trying to carry items rather than going without AD Person educated: Patient and Spouse Education method: Explanation Education comprehension: verbalized understanding       Note: Objective measures were completed at Evaluation unless otherwise noted.  DIAGNOSTIC FINDINGS:   COGNITION: Overall cognitive status: Within functional limits for tasks assessed and some instances of difficulty attending to task/sequencing   SENSATION: Not tested  COORDINATION: Deficits to rapid alternating movements    MUSCLE TONE: rigidity  MUSCLE LENGTH: 10-15 degree knee flexion contracture bilat   POSTURE: rounded shoulders, forward head, and increased thoracic kyphosis  LOWER EXTREMITY ROM:     Right ankle ROM limits from fusion  LOWER EXTREMITY MMT:    4/5 gross BLE  BED MOBILITY:  Findings: NT, reports fluctuating performance  TRANSFERS: Sit to stand: Modified independence and CGA  Assistive device utilized: U-Step     Stand to sit: CGA  Assistive device utilized: u-step     Chair to chair: Modified independence and CGA  Assistive device utilized: U-step         CURB:  Findings: CGA-min A  STAIRS: Findings: Comments: CGA-min A GAIT: Findings: Distance walked: 150, Assistive device  utilized:U-step, Level of assistance: CGA, and Comments: freezing in turns  FUNCTIONAL TESTS:  5 times sit to stand: 47.25 sec Timed up and go (TUG): 36 sec w/ U-step 10 meter walk test: 23.34 sec = 1.4 ft/sec w/ U-step Berg Balance Scale:  180 degree turns: 16-18 steps  Item Test date: 01/20/24 Test date:  Test date:   Sitting to standing 2. able to stand using hands after several tries Insert OPRCBERGREEVAL SmartPhrase at re-test date Insert OPRCBERGREEVAL SmartPhrase at re-test date  2. Standing unsupported 3. able to stand  2 minutes with supervision    3. Sitting with back unsupported, feet supported 4. able to sit safely and securely for 2 minutes    4. Standing to sitting 2. uses back of legs against chair to control descent    5. Pivot transfer  2. able to transfer with verbal cuing and/or supervision    6. Standing unsupported with eyes closed 3. able to stand 10 seconds with supervision    7. Standing unsupported with feet together 3. able to place feet together independently and stand 1 minute with supervision    8. Reaching forward with outstretched arms while standing 3. can reach forward 12 cm (5 inches)    9. Pick up object from the floor from standing 0. unable to try/needs assistance to keep from losing balance or falling    10. Turning to look behind over left and right shoulders while standing 2. turns sideways but only maintains balance    11. Turn 360 degrees 0. needs assistance while turning    12. Place alternate foot on step or stool while standing unsupported 1. able to complete > 2 steps needs minimal assist    13. Standing unsupported one foot in front 0. loses balance while stepping or standing    14. Standing on one leg 0. unable to try of needs assist to prevent fall      Total Score 25/56 Total Score /56 Total Score /56     PATIENT SURVEYS:  Freezing of gait questionnaire provided to fill out and return                                                                                                                               TREATMENT DATE:     PATIENT EDUCATION:a Education details: assessment details, rationale of tx interventions Person educated: Patient and Spouse Education method: Explanation Education comprehension: verbalized understanding  HOME EXERCISE PROGRAM: TBD  GOALS: Goals reviewed with patient? Yes  SHORT TERM GOALS: Target date: 02/17/2024    Patient will perform HEP with family/caregiver supervision for improved strength, balance, transfers, and gait  Baseline: Goal status: MET  2.  Demo improved postural control and BLE strength per time 35 sec 5xSTS test Baseline: 47 sec; 37 sec Goal status: NOT MET    LONG TERM GOALS: Target date: 06/04/24    Demo improved static strength and safety with ADL per score 40/56 Berg Balance Test Baseline: 25/56; 27/56; 29/56 05/04/24 Goal status: IN PROGRESS 05/04/24  2.  Demo improved safety/efficiency of turning negotiating 180 degree turn in 10-12 steps Baseline: 16-18 w/ U-step; 20-22 w/ U-step; 12-18 steps given min A for RW negotiation 04/13/24; 20+ steps and min A to prevent posterior LOB 05/04/24 Goal status: IN PROGRESS 05/04/24  3.  Demo improved safety with mobility per time 25 sec TUG test Baseline: 36 sec w/ U-step; 1 min 30 sec w/ U-step; 1 min 10 sec with RW and verbal cueing for  safe turning/navigation 05/04/24 Goal status: IN PROGRESS 05/04/24  4.  Spouse to teach-back strategies to help with reducing freezing of gait Baseline:  Goal status: MET   ASSESSMENT:  CLINICAL IMPRESSION: Patient arrived to session with report of a fall on Friday with head trauma. Patient denies any changes in symptoms. Standing PWR moves were performed with modifications d/t difficulty coordinating hip rotation movement, limited shoulder ROM, and limited spinal mobility. Patient responded to manual and visual cues for max success. Tolerated entirety of session in standing with good  tolerance and stamina. No complaints upon leaving.  OBJECTIVE IMPAIRMENTS: Abnormal gait, decreased activity tolerance, decreased balance, decreased coordination, decreased mobility, difficulty walking, decreased strength, improper body mechanics, and postural dysfunction.    PLAN:  PT FREQUENCY: 1x/week  PT DURATION: 4 weeks  PLANNED INTERVENTIONS: 97750- Physical Performance Testing, 97110-Therapeutic exercises, 97530- Therapeutic activity, [id number]- Neuromuscular re-education, 97535- Self Care, 02859- Manual therapy, [id number]- Gait training, and [phone]- Aquatic Therapy  PLAN FOR NEXT SESSION: plan for DC and transition to OT Outdoor gait, try auditory cues.  (Per end of session note, next visit is PN at visit 16).  patient moves best with U-step which they have at home. Make sure to use U-step in sessions!   Louana Terrilyn Christians, PT, DPT 05/24/24 4:17 PM  [facility] [address] [place], [address] [place], [place] [address] Phone # [phone] Fax # [phone]

## 2024-05-24 ENCOUNTER — Ambulatory Visit: Admitting: Physical Therapy

## 2024-05-24 ENCOUNTER — Encounter: Payer: Self-pay | Admitting: Physical Therapy

## 2024-05-24 DIAGNOSIS — M6281 Muscle weakness (generalized): Secondary | ICD-10-CM

## 2024-05-24 DIAGNOSIS — R2681 Unsteadiness on feet: Secondary | ICD-10-CM | POA: Diagnosis not present

## 2024-05-24 DIAGNOSIS — R29818 Other symptoms and signs involving the nervous system: Secondary | ICD-10-CM | POA: Diagnosis not present

## 2024-05-24 DIAGNOSIS — G20A2 Parkinson's disease without dyskinesia, with fluctuations: Secondary | ICD-10-CM | POA: Diagnosis not present

## 2024-05-24 DIAGNOSIS — R2689 Other abnormalities of gait and mobility: Secondary | ICD-10-CM

## 2024-05-24 DIAGNOSIS — R293 Abnormal posture: Secondary | ICD-10-CM | POA: Diagnosis not present

## 2024-05-31 ENCOUNTER — Ambulatory Visit: Attending: Neurology

## 2024-05-31 DIAGNOSIS — R278 Other lack of coordination: Secondary | ICD-10-CM | POA: Insufficient documentation

## 2024-05-31 DIAGNOSIS — R2689 Other abnormalities of gait and mobility: Secondary | ICD-10-CM | POA: Insufficient documentation

## 2024-05-31 DIAGNOSIS — R2681 Unsteadiness on feet: Secondary | ICD-10-CM | POA: Diagnosis not present

## 2024-05-31 DIAGNOSIS — G20A2 Parkinson's disease without dyskinesia, with fluctuations: Secondary | ICD-10-CM | POA: Diagnosis not present

## 2024-05-31 DIAGNOSIS — R293 Abnormal posture: Secondary | ICD-10-CM | POA: Diagnosis not present

## 2024-05-31 DIAGNOSIS — R262 Difficulty in walking, not elsewhere classified: Secondary | ICD-10-CM | POA: Insufficient documentation

## 2024-05-31 DIAGNOSIS — R29818 Other symptoms and signs involving the nervous system: Secondary | ICD-10-CM | POA: Insufficient documentation

## 2024-05-31 DIAGNOSIS — M6281 Muscle weakness (generalized): Secondary | ICD-10-CM | POA: Diagnosis not present

## 2024-05-31 NOTE — Therapy (Signed)
 OUTPATIENT PHYSICAL THERAPY NEURO TREATMENT, Progress Note, and Recertification  Patient Name: [name] MRN: [id number] DOB:05-May-1936, 88 y.o., male Today's Date: 05/31/2024   PCP: Yolande Toribio MATSU, MD REFERRING PROVIDER: Buck Saucer, MD  Progress Note Reporting Period 05/04/24 to 05/31/24  See note below for Objective Data and Assessment of Progress/Goals.      END OF SESSION:  PT End of Session - 05/31/24 1534     Visit Number 17    Number of Visits 23    Date for Recertification  07/26/24    Authorization Type Medicare/[employer]    Progress Note Due on Visit 27    PT Start Time Jun 09, 1531    PT Stop Time 1615    PT Time Calculation (min) 43 min    Equipment Utilized During Treatment Gait belt    Activity Tolerance Patient tolerated treatment well    Behavior During Therapy WFL for tasks assessed/performed              Past Medical History:  Diagnosis Date   Ankylosing spondylitis (HCC) dx'd ~ 1974   Arthritis    back (08/10/2015)   BENIGN PROSTATIC HYPERTROPHY, WITH OBSTRUCTION 01/15/2010   Bleeding duodenal ulcer    Bleeding esophageal ulcer    Bleeding stomach ulcer    GERD 02/22/2008   History of blood transfusion 08/26/1986   lost ~ 1/2 of my blood volume from multiple bleeding ulcers   History of hiatal hernia    HYPERGLYCEMIA 11/18/2007   HYPERLIPIDEMIA 02/22/2008   HYPERTENSION, UNSPECIFIED 11/10/2009   Kidney stones    Osteoarthritis    c-spine   Parkinson's disease (HCC)    PEPTIC ULCER DISEASE 11/17/2007   Situational depression    son died in MVA 06-09-2015   TIA (transient ischemic attack)    not that I know of in the past; they are trying to determine if I've had one today (08/10/2015)   Past Surgical History:  Procedure Laterality Date   CATARACT EXTRACTION W/ INTRAOCULAR LENS  IMPLANT, BILATERAL Bilateral 06-08-2012   Patient Active Problem List   Diagnosis Date Noted   Ankylosing spondylitis of cervicothoracic region (HCC) 10/16/2016    TIA (transient ischemic attack) 08/10/2015   Carotid stenosis 08/04/2014   Hyperlipidemia 02/21/2012   BENIGN PROSTATIC HYPERTROPHY, WITH OBSTRUCTION 01/15/2010   Essential hypertension 11/10/2009   GERD 02/22/2008   NEPHROLITHIASIS, HX OF 11/17/2007    ONSET DATE: PD x years onset  REFERRING DIAG: R26.81 (ICD-10-CM) - Unsteadiness on feet R29.3 (ICD-10-CM) - Abnormal posture R27.8 (ICD-10-CM) - Other lack of coordination G20.A2 (ICD-10-CM) - Parkinson's disease with fluctuating manifestations, unspecified whether dyskinesia present (HCC)  THERAPY DIAG:  Muscle weakness (generalized)  Unsteadiness on feet  Other abnormalities of gait and mobility  Parkinson's disease with fluctuating manifestations, unspecified whether dyskinesia present (HCC)  Other symptoms and signs involving the nervous system  Abnormal posture  Difficulty in walking, not elsewhere classified  Rationale for Evaluation and Treatment: Rehabilitation  SUBJECTIVE:  SUBJECTIVE STATEMENT: Have experienced additional falls with falling backwards. Have been more consistent with using U-step all the time. Difficulty with sequencing turns to sitting down. Walking outdoors at the park.   Pt accompanied by: significant other  PERTINENT HISTORY: complex medical history of peptic ulcer disease with history of upper GI bleed, TIA, depression, degenerative disc disease in the neck, osteoarthritis, kidney stones, hyperlipidemia, hypertension, history of hiatal hernia, BPH, arthritis, and ankylosing spondylitis, and parkinsonism  PAIN:  Are you having pain? Not really  PRECAUTIONS: Fall  RED FLAGS: None   WEIGHT BEARING RESTRICTIONS: No  FALLS: Has patient fallen in last 3 months? Unsure as to number, notes fall/LOB every  other week  LIVING ENVIRONMENT: Lives with: lives with their spouse Lives in: House/apartment Stairs: Ramp to enter, stairs to loft and basement, ground floor set-up Has following equipment at home: [name] - 2 wheeled  PLOF: Needs assistance with ADLs, Needs assistance with homemaking, and Needs assistance with gait  PATIENT GOALS:   OBJECTIVE:   TODAY'S TREATMENT: 05/31/24 Activity Comments  TUG test w/ U-step 52 sec 43 sec  Berg Balance Test 28/56  Freezing of gait strategies Use of metronome to sequence steps for turning in place for turning to sit w/ U-step               TODAY'S TREATMENT: 05/21/24 Activity Comments  Gait training into/out of clinic with RW Cueing to kick and step and keep eyes ahead to mitigate freezing   standing PWR moves in II bars: up rock Twist (modified with cross body punch instead) step Use of mirror feedback. Manual cueing for large amplitude stretch with arms. Use of manual feedback to increase lateral wt shift with rock. Pt with difficulty completing LE pivot necessary to complete PWR twist, thus performed cross body punch instead. Use of stomp and visual targets on floor for PWR step  standing trunk twist with ball Cues to stop and reset posture and balance in midline; tendency for LOB with L>R rotation      PATIENT EDUCATION: Education details: discussed using U step when trying to carry items rather than going without AD Person educated: Patient and Spouse Education method: Explanation Education comprehension: verbalized understanding       Note: Objective measures were completed at Evaluation unless otherwise noted.  DIAGNOSTIC FINDINGS:   COGNITION: Overall cognitive status: Within functional limits for tasks assessed and some instances of difficulty attending to task/sequencing   SENSATION: Not tested  COORDINATION: Deficits to rapid alternating movements    MUSCLE TONE: rigidity  MUSCLE LENGTH: 10-15 degree  knee flexion contracture bilat   POSTURE: rounded shoulders, forward head, and increased thoracic kyphosis  LOWER EXTREMITY ROM:     Right ankle ROM limits from fusion  LOWER EXTREMITY MMT:    4/5 gross BLE  BED MOBILITY:  Findings: NT, reports fluctuating performance  TRANSFERS: Sit to stand: Modified independence and CGA  Assistive device utilized: U-Step     Stand to sit: CGA  Assistive device utilized: u-step     Chair to chair: Modified independence and CGA  Assistive device utilized: U-step         CURB:  Findings: CGA-min A  STAIRS: Findings: Comments: CGA-min A GAIT: Findings: Distance walked: 150, Assistive device utilized:U-step, Level of assistance: CGA, and Comments: freezing in turns  FUNCTIONAL TESTS:  5 times sit to stand: 47.25 sec Timed up and go (TUG): 36 sec w/ U-step 10 meter walk test: 23.34 sec = 1.4 ft/sec w/  Shan Levins Balance Scale:  180 degree turns: 16-18 steps  Item Test date: 01/20/24 Test date: 06/01/23 Test date:   Sitting to standing 2. able to stand using hands after several tries 2. able to stand using hands after several tries  3. able to stand 2 minutes with supervision  4. able to sit safely and securely for 2 minutes   2. uses back of legs against chair to control descent  2. able to transfer with verbal cuing and/or supervision  3. able to stand 10 seconds with supervision  3. able to place feet together independently and stand 1 minute with supervision  3. can reach forward 12 cm (5 inches)    3. able to pick up slipper but needs supervision  1. needs supervision when turning    1. needs close supervision or verbal cuing  1. able to complete > 2 steps needs minimal assist    0. loses balance while stepping or standing   0. unable to try of needs assist to prevent fall       Insert OPRCBERGREEVAL SmartPhrase at re-test date  2. Standing unsupported 3. able to stand 2 minutes with supervision    3. Sitting with back  unsupported, feet supported 4. able to sit safely and securely for 2 minutes    4. Standing to sitting 2. uses back of legs against chair to control descent    5. Pivot transfer  2. able to transfer with verbal cuing and/or supervision    6. Standing unsupported with eyes closed 3. able to stand 10 seconds with supervision    7. Standing unsupported with feet together 3. able to place feet together independently and stand 1 minute with supervision    8. Reaching forward with outstretched arms while standing 3. can reach forward 12 cm (5 inches)    9. Pick up object from the floor from standing 0. unable to try/needs assistance to keep from losing balance or falling    10. Turning to look behind over left and right shoulders while standing 2. turns sideways but only maintains balance    11. Turn 360 degrees 0. needs assistance while turning    12. Place alternate foot on step or stool while standing unsupported 1. able to complete > 2 steps needs minimal assist    13. Standing unsupported one foot in front 0. loses balance while stepping or standing    14. Standing on one leg 0. unable to try of needs assist to prevent fall      Total Score 25/56 Total Score  28 /56 Total Score /56     PATIENT SURVEYS:  Freezing of gait questionnaire provided to fill out and return                                                                                                                              TREATMENT DATE:     PATIENT EDUCATION:a Education details: assessment  details, rationale of tx interventions Person educated: Patient and Spouse Education method: Explanation Education comprehension: verbalized understanding  HOME EXERCISE PROGRAM: TBD  GOALS: Goals reviewed with patient? Yes  SHORT TERM GOALS: Target date: 06/21/2024      Patient will perform HEP with family/caregiver supervision for improved strength, balance, transfers, and gait  Baseline: Goal status: MET  2.  Demo  improved postural control and BLE strength per time 35 sec 5xSTS test Baseline: 47 sec; 37 sec; 38 sec Goal status: IN PROGRESS 05/31/24    LONG TERM GOALS: Target date: 07/26/2024      Maintain score 28-30/56 Berg Balance Test for safety during ADL Baseline: 25/56; 27/56; 29/56; 28/56 Goal status: IN PROGRESS 05/31/24  2.  Demo improved safety/efficiency of turning negotiating 180 degree turn in 10-12 steps Baseline: 16-18 w/ U-step; 20-22 w/ U-step; 12-18 steps given min A for RW negotiation 04/13/24; 20+ steps and min A to prevent posterior LOB 05/04/24 Goal status: IN PROGRESS 05/04/24  3.  Demo improved safety with mobility per time 25 sec TUG test Baseline: 36 sec w/ U-step; 1 min 30 sec w/ U-step; 1 min 10 sec with RW and verbal cueing for safe turning/navigation; 47 sec w/ U-step Goal status: IN PROGRESS 05/31/24  4.  Spouse/patient to teach-back strategies to help with reducing freezing of gait to improve safety with turning to sit w/ U-step Baseline:  Goal status: IN PROGRESS   ASSESSMENT:  CLINICAL IMPRESSION: Review of POC details with improved performance TUG test w/ U-step with 47 sec avergae time indicating high risk for falls. Berg Balance Test w/ score 28/56 consistent with past performance and also indicating high risk for falls.  Continues to have difficulty with freezing of gait and festinating and loss of postural control w/ retropulsion requiring total assist for regaining postural control.  Review of strategies for freezing of gait and improving sequencing with spouse endorsing difficulty with sequencing turning for safe stand to sit transfers when using U-step walker.  Discussed additional auditory strategies to try for negotiating turns (metronome with U-step for turning in place). Pt would benefit from ongoing PT services for skilled maintenance to prevent functional decline as well as requiring hands-on assistance for reducing risk for falls during activities as he has  complete LOB with loss of postural control/retropulsion requiring total assistance and skilled handling to recover and regain control.  OBJECTIVE IMPAIRMENTS: Abnormal gait, decreased activity tolerance, decreased balance, decreased coordination, decreased mobility, difficulty walking, decreased strength, improper body mechanics, and postural dysfunction.    PLAN:  PT FREQUENCY: 1x/week  PT DURATION: 6 weeks  PLANNED INTERVENTIONS: 97750- Physical Performance Testing, 97110-Therapeutic exercises, 97530- Therapeutic activity, [id number]- Neuromuscular re-education, 97535- Self Care, 02859- Manual therapy, [id number]- Gait training, and [phone]- Aquatic Therapy  PLAN FOR NEXT SESSION: metronome with U-step for sequencing steps for turning to perform stand to sit transfer  5:05 PM, 05/31/24 M. Kelly Desa Rech, PT, DPT Physical Therapist- [place] Office Number: [phone]

## 2024-06-09 ENCOUNTER — Ambulatory Visit

## 2024-06-09 ENCOUNTER — Ambulatory Visit: Payer: Self-pay | Admitting: Occupational Therapy

## 2024-06-09 ENCOUNTER — Other Ambulatory Visit: Payer: Self-pay

## 2024-06-09 DIAGNOSIS — R2681 Unsteadiness on feet: Secondary | ICD-10-CM | POA: Diagnosis not present

## 2024-06-09 DIAGNOSIS — M6281 Muscle weakness (generalized): Secondary | ICD-10-CM

## 2024-06-09 DIAGNOSIS — R2689 Other abnormalities of gait and mobility: Secondary | ICD-10-CM

## 2024-06-09 DIAGNOSIS — R29818 Other symptoms and signs involving the nervous system: Secondary | ICD-10-CM

## 2024-06-09 DIAGNOSIS — R262 Difficulty in walking, not elsewhere classified: Secondary | ICD-10-CM

## 2024-06-09 DIAGNOSIS — R293 Abnormal posture: Secondary | ICD-10-CM

## 2024-06-09 DIAGNOSIS — R278 Other lack of coordination: Secondary | ICD-10-CM

## 2024-06-09 DIAGNOSIS — G20A2 Parkinson's disease without dyskinesia, with fluctuations: Secondary | ICD-10-CM | POA: Diagnosis not present

## 2024-06-09 NOTE — Therapy (Signed)
 OUTPATIENT OCCUPATIONAL THERAPY PARKINSON'S EVALUATION  Patient Name: [name] MRN: [id number] DOB:09-07-1935, 88 y.o., male Today's Date: 06/09/2024  PCP: Yolande Toribio MATSU, MD REFERRING PROVIDER: Cary No, NP  END OF SESSION:  OT End of Session - 06/09/24 1519     Visit Number 1    Number of Visits 10    Date for Recertification  07/30/24    Authorization Type Medicare A &B    OT Start Time 1405    OT Stop Time 1450    OT Time Calculation (min) 45 min          Past Medical History:  Diagnosis Date   Ankylosing spondylitis (HCC) dx'd ~ 1973-06-25   Arthritis    back (08/10/2015)   BENIGN PROSTATIC HYPERTROPHY, WITH OBSTRUCTION 01/15/2010   Bleeding duodenal ulcer    Bleeding esophageal ulcer    Bleeding stomach ulcer    GERD 02/22/2008   History of blood transfusion 08/26/1986   lost ~ 1/2 of my blood volume from multiple bleeding ulcers   History of hiatal hernia    HYPERGLYCEMIA 11/18/2007   HYPERLIPIDEMIA 02/22/2008   HYPERTENSION, UNSPECIFIED 11/10/2009   Kidney stones    Osteoarthritis    c-spine   Parkinson's disease (HCC)    PEPTIC ULCER DISEASE 11/17/2007   Situational depression    son died in MVA 26-Jun-2015   TIA (transient ischemic attack)    not that I know of in the past; they are trying to determine if I've had one today (08/10/2015)   Past Surgical History:  Procedure Laterality Date   CATARACT EXTRACTION W/ INTRAOCULAR LENS  IMPLANT, BILATERAL Bilateral 25-Jun-2012   Patient Active Problem List   Diagnosis Date Noted   Ankylosing spondylitis of cervicothoracic region (HCC) 10/16/2016   TIA (transient ischemic attack) 08/10/2015   Carotid stenosis 08/04/2014   Hyperlipidemia 02/21/2012   BENIGN PROSTATIC HYPERTROPHY, WITH OBSTRUCTION 01/15/2010   Essential hypertension 11/10/2009   GERD 02/22/2008   NEPHROLITHIASIS, HX OF 11/17/2007    ONSET DATE: referral date 05/07/24  REFERRING DIAG: G20.A2 (ICD-10-CM) - Parkinson's disease with  fluctuating manifestations, unspecified whether dyskinesia present  THERAPY DIAG:  Other symptoms and signs involving the nervous system  Abnormal posture  Unsteadiness on feet  Muscle weakness (generalized)  Other lack of coordination  Rationale for Evaluation and Treatment: Rehabilitation  SUBJECTIVE:   SUBJECTIVE STATEMENT: I feel like I have made some progress in some areas.  Pt reporting having more movement in his feet.  Pt reporting still needing help with getting dressed.  [place] and attempting to transition to using U-step in the home.  Pt has had multiple falls in the bathroom when transferring to/from toilet.  Spouse reports that it can take up to 2 hours to bathe and get dressed at the sink. Pt accompanied by: self and significant other (spouse, [name])  PERTINENT HISTORY: history of Parkinson's disease, duodenal and esophageal ulcers, ankylosing spondylitis, hypertension and hyperlipidemia   PRECAUTIONS: Fall  WEIGHT BEARING RESTRICTIONS: No  PAIN:  Are you having pain? No  FALLS: Has patient fallen in last 6 months? Yes. Number of falls : falling more than once a week, most often in the bathroom or when attempting to walk without walker  LIVING ENVIRONMENT: Lives with: lives with their spouse Lives in: House/apartment Stairs: Yes: External: 2-3 steps; has a ramp at entrance with raised railings Internal: steps to basement and steps to loft - however pt does not need  to go up/down those at this time. Has following equipment at home: [name] - 2 wheeled, Environmental consultant - 4 wheeled, U-step, bed side commode, Shower chair, Ramped entry, and transport chair; 21 commode and arm rests that are attached to toilet seat, doorways have been widened  PLOF: Requires assistive device for independence and Needs assistance with ADLs; [place]  PATIENT GOALS: safety with bathroom transfers and strategies for  dressing tasks  OBJECTIVE:  Note: Objective measures were completed at Evaluation unless otherwise noted.  HAND DOMINANCE: Right  ADLs: Overall ADLs: fluctuates drastically; can take up to 2 hours for bathing and dressing Transfers/ambulation related to ADLs: Utilizing U-step for mobility in and outside of the home  Eating: he's doing well with the eating manageable UB Dressing: occasional assistance, button up shirt is easier than pullover LB Dressing: getting dressed on a closed commode seat Toileting: Pt will occasionally require assistance to get up from toilet, has had falls attempting to transfer both to and from toilet Bathing: sponge bathing at sink without assistance from shower chair seated at sink, spouse assisting with washing hair; Setup for washing feet  Tub Shower transfers: not currently performing Equipment: Shower seat with back and Walk in shower with 5.5 ledge with 30 door  IADLs: Light housekeeping: washes dishes when he is feeling strong, however frequent onset of weakness Meal Prep: Pt was primary cook prior to fall in 2023, but has returned to participation in aspects of meal prep Community mobility: recently renewed drivers license, but is not driving  MOBILITY STATUS: Needs Assist: CGA with mobility with RW vs U-step and Hx of falls   POSTURE COMMENTS:  rounded shoulders and forward head, posterior pelvic tilt and R lean    FUNCTIONAL OUTCOME MEASURES:    UE ROM:  all movements are slowed, decreased shoulder flexion d/t ankylosing spondylitis   UE MMT:   grossly 3+ to 4/5 overall  COGNITION: Overall cognitive status: spouse reports thinking continues to be slower  OBSERVATIONS: Bradykinesia                                                                                                                    TREATMENT DATE:  06/09/24: Educated on use of 90* turns when making transfers in bathroom. Educated on improvements in stepping pattern  and turns, from previous therapy sessions, when turning 90* turns to allow for increased amplitude and decreased crossing of feet.    PATIENT EDUCATION: Education details: Educated on role and purpose of OT as well as potential interventions and goals for therapy based on initial evaluation findings. Person educated: Patient and Spouse Education method: Medical illustrator Education comprehension: verbalized understanding and needs further education  HOME EXERCISE PROGRAM: TBD  GOALS: Goals reviewed with patient? Yes  SHORT TERM GOALS: Target date: 07/02/24  Pt and spouse will be independent in full body HEP with focus on large amplitude movements as needed to maintain/preserve function as pertaining to ADLs/IADLs.  Baseline:  Goal status:  INITIAL  2.  Pt will demonstrate ability to complete sequencing of approach to transfer and sit > stand as needed to get up from toilet or other seating options in home by completing x3 throughout session without cues or physical assistance to maintain/preserve function.  Baseline:  Goal status: INITIAL  3.  Pt and spouse will report understanding of DME vs adaptive techniques/strategies and/or modifications to routines to increase safety with mobility and self-care tasks in the home.  Baseline:  Goal status: INITIAL    LONG TERM GOALS: Target date: 07/30/24  Pt will demonstrate improved ease and independence with sit > stand as evidenced by completing 5x sit > stand without posterior LOB and improved time by 5 seconds. Baseline: TBD, heavy posterior lean with sit > stand on eval Goal status: INITIAL  2.  Pt will demonstrate and/or report ability to complete UB dressing (both button up shirt and pull over sweatshirt) with min assist or better with use of AE and/or alternative strategies PRN.  Baseline: pt reporting 5-7 on PSFS with UB dressing Goal status: INITIAL  3.  Pt will demonstrate and/or report ability to complete LB  dressing (donning pants) with min assist or better with use of AE and/or alternative strategies PRN.  Baseline: pt reporting 5 on PSFS Goal status: INITIAL  4.  Pt will demonstrate and/or report ability to complete LB dressing (donning shoes) with mod assist or better with use of AE and/or alternative strategies PRN.  Baseline: max-total assist for tie shoes and min assist to supervision for slip on - but prefers tie shoes for safety with walking Goal status: INITIAL  5.  Patient will report at least two-point increase in average PSFS score or at least three-point increase in a single activity score indicating functionally significant improvement given minimum detectable change. Baseline: 4.6 Goal status: INITIAL   ASSESSMENT:  CLINICAL IMPRESSION: Patient is a 88 y.o. male who was seen today for occupational therapy evaluation for advancements in Parkinson's disease, increased falls, and decreased independence with ADLs/IADLs.  Pt lives with spouse in home where he can reside on main floor with ramped entrance and recently widened bathroom doors.  Pt continues to require significantly increased time to complete aspects or dressing and/or physical assistance from spouse, particularly with underwear, socks, and shoes as well as button up shirts and pull over sweatshirts. Pt also having increased falls in the bathroom due to difficulty with sequencing of turns when accessing toilet.  Pt will benefit from skilled occupational therapy services to address strength and coordination, ROM, pain management, balance, GM/FM control, cognition, safety awareness, introduction of compensatory strategies/AE prn, and implementation of an HEP to improve participation and safety during ADLs and IADLs and quality of life. SABRA   PERFORMANCE DEFICITS: in functional skills including ADLs, IADLs, ROM, strength, flexibility, mobility, balance, body mechanics, decreased knowledge of precautions, decreased knowledge of use of  DME, and UE functional use and psychosocial skills including coping strategies, environmental adaptation, and routines and behaviors.   IMPAIRMENTS: are limiting patient from ADLs and IADLs.   COMORBIDITIES:  may have co-morbidities  that affects occupational performance. Patient will benefit from skilled OT to address above impairments and improve overall function.  MODIFICATION OR ASSISTANCE TO COMPLETE EVALUATION: Min-Moderate modification of tasks or assist with assess necessary to complete an evaluation.  OT OCCUPATIONAL PROFILE AND HISTORY: Detailed assessment: Review of records and additional review of physical, cognitive, psychosocial history related to current functional performance.  CLINICAL DECISION MAKING: Moderate - several  treatment options, min-mod task modification necessary  REHAB POTENTIAL: Good  EVALUATION COMPLEXITY: Moderate    PLAN:  OT FREQUENCY: 2x/week for 3 weeks, then 1x/week for remaining 3 weeks  OT DURATION: 6 weeks (asking 8 weeks to accommodate scheduling)  PLANNED INTERVENTIONS: 02831 OT Re-evaluation, 97535 self care/ADL training, 02889 therapeutic exercise, 97530 therapeutic activity, 97112 neuromuscular re-education, 97140 manual therapy, 97035 ultrasound, 97032 electrical stimulation (manual), functional mobility training, psychosocial skills training, energy conservation, coping strategies training, patient/family education, and DME and/or AE instructions  RECOMMENDED OTHER SERVICES: NA  CONSULTED AND AGREED WITH PLAN OF CARE: Patient and family member/caregiver  PLAN FOR NEXT SESSION: time 5x/ sit > stand and time don/doff jacket; educate on modified technique for button up and pull over shirts, problem solve height of chair to increase ease with LB dressing and donning shoes, transfers with 90* turns and big steps, large amplitude exercises   Deana Krock, OTR/L 06/09/2024, 3:21 PM  [facility] at [facility] [address] [place], [place] [address] Phone # [phone] Fax # [phone]

## 2024-06-09 NOTE — Therapy (Signed)
 OUTPATIENT PHYSICAL THERAPY NEURO TREATMENT  Patient Name: [name] MRN: [id number] DOB:01-31-1936, 88 y.o., male Today's Date: 06/09/2024   PCP: Yolande Toribio MATSU, MD REFERRING PROVIDER: Buck Saucer, MD      END OF SESSION:  PT End of Session - 06/09/24 1319     Visit Number 18    Number of Visits 23    Date for Recertification  07/26/24    Authorization Type Medicare/[employer]ARP    Progress Note Due on Visit 27    PT Start Time 1315    PT Stop Time 1400    PT Time Calculation (min) 45 min    Equipment Utilized During Treatment Gait belt    Activity Tolerance Patient tolerated treatment well    Behavior During Therapy WFL for tasks assessed/performed              Past Medical History:  Diagnosis Date   Ankylosing spondylitis (HCC) dx'd ~ 07/18/73   Arthritis    back (08/10/2015)   BENIGN PROSTATIC HYPERTROPHY, WITH OBSTRUCTION 01/15/2010   Bleeding duodenal ulcer    Bleeding esophageal ulcer    Bleeding stomach ulcer    GERD 02/22/2008   History of blood transfusion 08/26/1986   lost ~ 1/2 of my blood volume from multiple bleeding ulcers   History of hiatal hernia    HYPERGLYCEMIA 11/18/2007   HYPERLIPIDEMIA 02/22/2008   HYPERTENSION, UNSPECIFIED 11/10/2009   Kidney stones    Osteoarthritis    c-spine   Parkinson's disease (HCC)    PEPTIC ULCER DISEASE 11/17/2007   Situational depression    son died in MVA 2015-07-19   TIA (transient ischemic attack)    not that I know of in the past; they are trying to determine if I've had one today (08/10/2015)   Past Surgical History:  Procedure Laterality Date   CATARACT EXTRACTION W/ INTRAOCULAR LENS  IMPLANT, BILATERAL Bilateral 18-Jul-2012   Patient Active Problem List   Diagnosis Date Noted   Ankylosing spondylitis of cervicothoracic region (HCC) 10/16/2016   TIA (transient ischemic attack) 08/10/2015   Carotid stenosis 08/04/2014   Hyperlipidemia 02/21/2012   BENIGN PROSTATIC HYPERTROPHY, WITH OBSTRUCTION  01/15/2010   Essential hypertension 11/10/2009   GERD 02/22/2008   NEPHROLITHIASIS, HX OF 11/17/2007    ONSET DATE: PD x years onset  REFERRING DIAG: R26.81 (ICD-10-CM) - Unsteadiness on feet R29.3 (ICD-10-CM) - Abnormal posture R27.8 (ICD-10-CM) - Other lack of coordination G20.A2 (ICD-10-CM) - Parkinson's disease with fluctuating manifestations, unspecified whether dyskinesia present (HCC)  THERAPY DIAG:  Muscle weakness (generalized)  Unsteadiness on feet  Other abnormalities of gait and mobility  Parkinson's disease with fluctuating manifestations, unspecified whether dyskinesia present (HCC)  Other symptoms and signs involving the nervous system  Abnormal posture  Difficulty in walking, not elsewhere classified  Rationale for Evaluation and Treatment: Rehabilitation  SUBJECTIVE:  SUBJECTIVE STATEMENT: Doing ok, have had a few falls since last session, but no lasting injuries  Pt accompanied by: significant other  PERTINENT HISTORY: complex medical history of peptic ulcer disease with history of upper GI bleed, TIA, depression, degenerative disc disease in the neck, osteoarthritis, kidney stones, hyperlipidemia, hypertension, history of hiatal hernia, BPH, arthritis, and ankylosing spondylitis, and parkinsonism  PAIN:  Are you having pain? Not really  PRECAUTIONS: Fall  RED FLAGS: None   WEIGHT BEARING RESTRICTIONS: No  FALLS: Has patient fallen in last 3 months? Unsure as to number, notes fall/LOB every other week  LIVING ENVIRONMENT: Lives with: lives with their spouse Lives in: House/apartment Stairs: Ramp to enter, stairs to loft and basement, ground floor set-up Has following equipment at home: [name] - 2 wheeled  PLOF: Needs assistance with ADLs, Needs assistance with  homemaking, and Needs assistance with gait  PATIENT GOALS:   OBJECTIVE:   TODAY'S TREATMENT: 06/09/24 Activity Comments  NU-step level 3 x 6 min Maintaining 70+ SPM  strength Standing unilat row 3x10, 15# Seated row 3x10, 25# Seated lat row 3x10, 35#  Transfer training -trials w/ trunk flexion over BOS for sit to stand -auditory and visual cues for sequencing turns for chair to chair transfers              TODAY'S TREATMENT: 05/31/24 Activity Comments  TUG test w/ U-step 52 sec 43 sec  Berg Balance Test 28/56  Freezing of gait strategies Use of metronome to sequence steps for turning in place for turning to sit w/ U-step               TODAY'S TREATMENT: 05/21/24 Activity Comments  Gait training into/out of clinic with RW Cueing to kick and step and keep eyes ahead to mitigate freezing   standing PWR moves in II bars: up rock Twist (modified with cross body punch instead) step Use of mirror feedback. Manual cueing for large amplitude stretch with arms. Use of manual feedback to increase lateral wt shift with rock. Pt with difficulty completing LE pivot necessary to complete PWR twist, thus performed cross body punch instead. Use of stomp and visual targets on floor for PWR step  standing trunk twist with ball Cues to stop and reset posture and balance in midline; tendency for LOB with L>R rotation      PATIENT EDUCATION: Education details: discussed using U step when trying to carry items rather than going without AD Person educated: Patient and Spouse Education method: Explanation Education comprehension: verbalized understanding       Note: Objective measures were completed at Evaluation unless otherwise noted.  DIAGNOSTIC FINDINGS:   COGNITION: Overall cognitive status: Within functional limits for tasks assessed and some instances of difficulty attending to task/sequencing   SENSATION: Not tested  COORDINATION: Deficits to rapid alternating  movements    MUSCLE TONE: rigidity  MUSCLE LENGTH: 10-15 degree knee flexion contracture bilat   POSTURE: rounded shoulders, forward head, and increased thoracic kyphosis  LOWER EXTREMITY ROM:     Right ankle ROM limits from fusion  LOWER EXTREMITY MMT:    4/5 gross BLE  BED MOBILITY:  Findings: NT, reports fluctuating performance  TRANSFERS: Sit to stand: Modified independence and CGA  Assistive device utilized: U-Step     Stand to sit: CGA  Assistive device utilized: u-step     Chair to chair: Modified independence and CGA  Assistive device utilized: U-step         CURB:  Findings: CGA-min A  STAIRS: Findings: Comments: CGA-min A GAIT: Findings: Distance walked: 150, Assistive device utilized:U-step, Level of assistance: CGA, and Comments: freezing in turns  FUNCTIONAL TESTS:  5 times sit to stand: 47.25 sec Timed up and go (TUG): 36 sec w/ U-step 10 meter walk test: 23.34 sec = 1.4 ft/sec w/ U-step Lars Balance Scale:  180 degree turns: 16-18 steps  Item Test date: 01/20/24 Test date: 06/01/23 Test date:   Sitting to standing 2. able to stand using hands after several tries 2. able to stand using hands after several tries  3. able to stand 2 minutes with supervision  4. able to sit safely and securely for 2 minutes   2. uses back of legs against chair to control descent  2. able to transfer with verbal cuing and/or supervision  3. able to stand 10 seconds with supervision  3. able to place feet together independently and stand 1 minute with supervision  3. can reach forward 12 cm (5 inches)    3. able to pick up slipper but needs supervision  1. needs supervision when turning    1. needs close supervision or verbal cuing  1. able to complete > 2 steps needs minimal assist    0. loses balance while stepping or standing   0. unable to try of needs assist to prevent fall       Insert OPRCBERGREEVAL SmartPhrase at re-test date  2. Standing  unsupported 3. able to stand 2 minutes with supervision    3. Sitting with back unsupported, feet supported 4. able to sit safely and securely for 2 minutes    4. Standing to sitting 2. uses back of legs against chair to control descent    5. Pivot transfer  2. able to transfer with verbal cuing and/or supervision    6. Standing unsupported with eyes closed 3. able to stand 10 seconds with supervision    7. Standing unsupported with feet together 3. able to place feet together independently and stand 1 minute with supervision    8. Reaching forward with outstretched arms while standing 3. can reach forward 12 cm (5 inches)    9. Pick up object from the floor from standing 0. unable to try/needs assistance to keep from losing balance or falling    10. Turning to look behind over left and right shoulders while standing 2. turns sideways but only maintains balance    11. Turn 360 degrees 0. needs assistance while turning    12. Place alternate foot on step or stool while standing unsupported 1. able to complete > 2 steps needs minimal assist    13. Standing unsupported one foot in front 0. loses balance while stepping or standing    14. Standing on one leg 0. unable to try of needs assist to prevent fall      Total Score 25/56 Total Score  28 /56 Total Score /56     PATIENT SURVEYS:  Freezing of gait questionnaire provided to fill out and return  TREATMENT DATE:     PATIENT EDUCATION:a Education details: assessment details, rationale of tx interventions Person educated: Patient and Spouse Education method: Explanation Education comprehension: verbalized understanding  HOME EXERCISE PROGRAM: TBD  GOALS: Goals reviewed with patient? Yes  SHORT TERM GOALS: Target date: 06/21/2024      Patient will perform HEP with family/caregiver supervision for improved  strength, balance, transfers, and gait  Baseline: Goal status: MET  2.  Demo improved postural control and BLE strength per time 35 sec 5xSTS test Baseline: 47 sec; 37 sec; 38 sec Goal status: IN PROGRESS 05/31/24    LONG TERM GOALS: Target date: 07/26/2024      Maintain score 28-30/56 Berg Balance Test for safety during ADL Baseline: 25/56; 27/56; 29/56; 28/56 Goal status: IN PROGRESS 05/31/24  2.  Demo improved safety/efficiency of turning negotiating 180 degree turn in 10-12 steps Baseline: 16-18 w/ U-step; 20-22 w/ U-step; 12-18 steps given min A for RW negotiation 04/13/24; 20+ steps and min A to prevent posterior LOB 05/04/24 Goal status: IN PROGRESS 05/04/24  3.  Demo improved safety with mobility per time 25 sec TUG test Baseline: 36 sec w/ U-step; 1 min 30 sec w/ U-step; 1 min 10 sec with RW and verbal cueing for safe turning/navigation; 47 sec w/ U-step Goal status: IN PROGRESS 05/31/24  4.  Spouse/patient to teach-back strategies to help with reducing freezing of gait to improve safety with turning to sit w/ U-step Baseline:  Goal status: IN PROGRESS   ASSESSMENT:  CLINICAL IMPRESSION: Strength and conditioning wit emphasis on postural strength to address postural fault for promoting extension/posterior chain. Transfer training strategies to improve mobility and reduce freezing of gait and retropulsion with best performance using visual cues for sequence vs auditory/tactile. Continued sessions to progress caregiver training and prevent functional decline OBJECTIVE IMPAIRMENTS: Abnormal gait, decreased activity tolerance, decreased balance, decreased coordination, decreased mobility, difficulty walking, decreased strength, improper body mechanics, and postural dysfunction.    PLAN:  PT FREQUENCY: 1x/week  PT DURATION: 6 weeks  PLANNED INTERVENTIONS: 97750- Physical Performance Testing, 97110-Therapeutic exercises, 97530- Therapeutic activity, [id number]- Neuromuscular  re-education, 97535- Self Care, 02859- Manual therapy, [id number]- Gait training, and [phone]- Aquatic Therapy  PLAN FOR NEXT SESSION: metronome with U-step for sequencing steps for turning to perform stand to sit transfer  1:22 PM, 06/09/24 M. Kelly Nasrin Lanzo, PT, DPT Physical Therapist- [place] Office Number: [phone]

## 2024-06-10 DIAGNOSIS — M459 Ankylosing spondylitis of unspecified sites in spine: Secondary | ICD-10-CM | POA: Diagnosis not present

## 2024-06-21 ENCOUNTER — Ambulatory Visit: Admitting: Occupational Therapy

## 2024-06-21 DIAGNOSIS — R29818 Other symptoms and signs involving the nervous system: Secondary | ICD-10-CM

## 2024-06-21 DIAGNOSIS — G20A2 Parkinson's disease without dyskinesia, with fluctuations: Secondary | ICD-10-CM | POA: Diagnosis not present

## 2024-06-21 DIAGNOSIS — R293 Abnormal posture: Secondary | ICD-10-CM

## 2024-06-21 DIAGNOSIS — R2681 Unsteadiness on feet: Secondary | ICD-10-CM | POA: Diagnosis not present

## 2024-06-21 DIAGNOSIS — R2689 Other abnormalities of gait and mobility: Secondary | ICD-10-CM | POA: Diagnosis not present

## 2024-06-21 DIAGNOSIS — M6281 Muscle weakness (generalized): Secondary | ICD-10-CM

## 2024-06-21 NOTE — Therapy (Signed)
 OUTPATIENT OCCUPATIONAL THERAPY PARKINSON'S  Treatment Note  Patient Name: [name] MRN: [id number] DOB:[birth year], 88 y.o., male Today's Date: 06/21/2024  PCP: Yolande Toribio MATSU, MD REFERRING PROVIDER: Cary No, NP  END OF SESSION:  OT End of Session - 06/21/24 1019     Visit Number 2    Number of Visits 10    Date for Recertification  07/30/24    Authorization Type Medicare A &B    OT Start Time 1020    OT Stop Time 1100    OT Time Calculation (min) 40 min           Past Medical History:  Diagnosis Date   Ankylosing spondylitis (HCC) dx'd ~ 1974   Arthritis    back (08/10/2015)   BENIGN PROSTATIC HYPERTROPHY, WITH OBSTRUCTION 01/15/2010   Bleeding duodenal ulcer    Bleeding esophageal ulcer    Bleeding stomach ulcer    GERD 02/22/2008   History of blood transfusion 08/26/1986   lost ~ 1/2 of my blood volume from multiple bleeding ulcers   History of hiatal hernia    HYPERGLYCEMIA 11/18/2007   HYPERLIPIDEMIA 02/22/2008   HYPERTENSION, UNSPECIFIED 11/10/2009   Kidney stones    Osteoarthritis    c-spine   Parkinson's disease (HCC)    PEPTIC ULCER DISEASE 11/17/2007   Situational depression    son died in MVA 06/26/15   TIA (transient ischemic attack)    not that I know of in the past; they are trying to determine if I've had one today (08/10/2015)   Past Surgical History:  Procedure Laterality Date   CATARACT EXTRACTION W/ INTRAOCULAR LENS  IMPLANT, BILATERAL Bilateral 2013   Patient Active Problem List   Diagnosis Date Noted   Ankylosing spondylitis of cervicothoracic region (HCC) 10/16/2016   TIA (transient ischemic attack) 08/10/2015   Carotid stenosis 08/04/2014   Hyperlipidemia 02/21/2012   BENIGN PROSTATIC HYPERTROPHY, WITH OBSTRUCTION 01/15/2010   Essential hypertension 11/10/2009   GERD 02/22/2008   NEPHROLITHIASIS, HX OF 11/17/2007    ONSET DATE: referral date 05/07/24  REFERRING DIAG: G20.A2 (ICD-10-CM) - Parkinson's disease  with fluctuating manifestations, unspecified whether dyskinesia present  THERAPY DIAG:  Muscle weakness (generalized)  Unsteadiness on feet  Other symptoms and signs involving the nervous system  Abnormal posture  Rationale for Evaluation and Treatment: Rehabilitation  SUBJECTIVE:   SUBJECTIVE STATEMENT: Pt's spouse reporting that he is almost exclusively using the U-step at home, noticing improvements in gait.  Pt accompanied by: self and significant other (spouse, [name])  PERTINENT HISTORY: history of Parkinson's disease, duodenal and esophageal ulcers, ankylosing spondylitis, hypertension and hyperlipidemia   PRECAUTIONS: Fall  WEIGHT BEARING RESTRICTIONS: No  PAIN:  Are you having pain? No  FALLS: Has patient fallen in last 6 months? Yes. Number of falls : falling more than once a week, most often in the bathroom or when attempting to walk without walker  LIVING ENVIRONMENT: Lives with: lives with their spouse Lives in: House/apartment Stairs: Yes: External: 2-3 steps; has a ramp at entrance with raised railings Internal: steps to basement and steps to loft - however pt does not need to go up/down those at this time. Has following equipment at home: [name] - 2 wheeled, Environmental Consultant - 4 wheeled, U-step, bed side commode, Shower chair, Ramped entry, and transport chair; 21 commode and arm rests that are attached to toilet seat, doorways have been widened  PLOF: Requires assistive device for independence and Needs assistance with ADLs; [place]  PATIENT GOALS: safety with bathroom transfers and strategies for dressing tasks  OBJECTIVE:  Note: Objective measures were completed at Evaluation unless otherwise noted.  HAND DOMINANCE: Right  ADLs: Overall ADLs: fluctuates drastically; can take up to 2 hours for bathing and dressing Transfers/ambulation related to ADLs: Utilizing U-step for mobility in and outside of the home  Eating: he's  doing well with the eating manageable UB Dressing: occasional assistance, button up shirt is easier than pullover LB Dressing: getting dressed on a closed commode seat Toileting: Pt will occasionally require assistance to get up from toilet, has had falls attempting to transfer both to and from toilet Bathing: sponge bathing at sink without assistance from shower chair seated at sink, spouse assisting with washing hair; Setup for washing feet  Tub Shower transfers: not currently performing Equipment: Shower seat with back and Walk in shower with 5.5 ledge with 30 door  IADLs: Light housekeeping: washes dishes when he is feeling strong, however frequent onset of weakness Meal Prep: Pt was primary cook prior to fall in 2023, but has returned to participation in aspects of meal prep Community mobility: recently renewed drivers license, but is not driving  MOBILITY STATUS: Needs Assist: CGA with mobility with RW vs U-step and Hx of falls   POSTURE COMMENTS:  rounded shoulders and forward head, posterior pelvic tilt and R lean    FUNCTIONAL OUTCOME MEASURES:    UE ROM:  all movements are slowed, decreased shoulder flexion d/t ankylosing spondylitis   UE MMT:   grossly 3+ to 4/5 overall  COGNITION: Overall cognitive status: spouse reports thinking continues to be slower  OBSERVATIONS: Bradykinesia                                                                                                                    TREATMENT DATE:  06/21/24 Don/doff jacket: pt required 2:10 and assist to advance jacket around shoulders when donning jacket.  Engaged in massed practice with donning/doffing jacket.  Pt unable to advance jacket around shoulders independently, due to decreased shoulder ROM secondary to ankylosing spondylitis.  OT educating on various strategies to increase ease with donning jacket.  Pt demonstrating improvements in sitting with therapist aiding in advancing jacket around  shoulders and then pt able to thread 2nd arm.  OT also educating on donning harder arm first, to allow more difficult movement to be completed by second arm.  Pt demonstrating improved technique with doffing jacket, by utilizing large amplitude movements to advance jacket off shoulders and then ability to reach behind and remove arms from sleeves. 5x/ sit > stand: 38.53 sec.  Pt utilizing no hands when coming up to stand, however stabilizing self on U step walker briefly for upright stance before returning to sit.  Engaged in discussion about recommended height for sit > stand in home and to increase ease with aspects of dressing.  Pt utilizing 21.5 mat table this session.      06/09/24: Educated on use of 5* turns when making transfers  in bathroom. Educated on improvements in stepping pattern and turns, from previous therapy sessions, when turning 90* turns to allow for increased amplitude and decreased crossing of feet.    PATIENT EDUCATION: Education details: techniques for land Person educated: Patient and Spouse Education method: Medical Illustrator Education comprehension: verbalized understanding and needs further education  HOME EXERCISE PROGRAM: TBD  GOALS: Goals reviewed with patient? Yes  SHORT TERM GOALS: Target date: 07/02/24  Pt and spouse will be independent in full body HEP with focus on large amplitude movements as needed to maintain/preserve function as pertaining to ADLs/IADLs.  Baseline:  Goal status: in progress  2.  Pt will demonstrate ability to complete sequencing of approach to transfer and sit > stand as needed to get up from toilet or other seating options in home by completing x3 throughout session without cues or physical assistance to maintain/preserve function.  Baseline:  Goal status: in progress  3.  Pt and spouse will report understanding of DME vs adaptive techniques/strategies and/or modifications to routines to increase  safety with mobility and self-care tasks in the home.  Baseline:  Goal status: in progress    LONG TERM GOALS: Target date: 07/30/24  Pt will demonstrate improved ease and independence with sit > stand as evidenced by completing 5x sit > stand without posterior LOB and improved time by 5 seconds. Baseline: TBD, heavy posterior lean with sit > stand on eval Goal status: in progress  2.  Pt will demonstrate and/or report ability to complete UB dressing (both button up shirt and pull over sweatshirt) with min assist or better with use of AE and/or alternative strategies PRN.  Baseline: pt reporting 5-7 on PSFS with UB dressing Goal status: in progress  3.  Pt will demonstrate and/or report ability to complete LB dressing (donning pants) with min assist or better with use of AE and/or alternative strategies PRN.  Baseline: pt reporting 5 on PSFS Goal status: in progress  4.  Pt will demonstrate and/or report ability to complete LB dressing (donning shoes) with mod assist or better with use of AE and/or alternative strategies PRN.  Baseline: max-total assist for tie shoes and min assist to supervision for slip on - but prefers tie shoes for safety with walking Goal status: in progress  5.  Patient will report at least two-point increase in average PSFS score or at least three-point increase in a single activity score indicating functionally significant improvement given minimum detectable change. Baseline: 4.6 Goal status: in progress   ASSESSMENT:  CLINICAL IMPRESSION: Patient is a 88 y.o. male who was seen today for occupational therapy treatment for advancements in Parkinson's disease, increased falls, and decreased independence with ADLs/IADLs.  Pt benefits greatly from use of U-step with ambulation, demonstrating increased step length and pattern as well as transfers.  Pt completing sit > stand throughout session without UE support and demonstrating difficulty with sequencing when  doffing and donning jacket, benefiting from massed practice and cues to complete tasks in similar manner to allow for increased carryover. Pt will continue to benefit from skilled occupational therapy services to address strength and coordination, ROM, pain management, balance, GM/FM control, cognition, safety awareness, introduction of compensatory strategies/AE prn, and implementation of an HEP to improve participation and safety during ADLs and IADLs and quality of life. SABRA   PERFORMANCE DEFICITS: in functional skills including ADLs, IADLs, ROM, strength, flexibility, mobility, balance, body mechanics, decreased knowledge of precautions, decreased knowledge of use of DME, and UE functional  use and psychosocial skills including coping strategies, environmental adaptation, and routines and behaviors.   IMPAIRMENTS: are limiting patient from ADLs and IADLs.     PLAN:  OT FREQUENCY: 2x/week for 3 weeks, then 1x/week for remaining 3 weeks  OT DURATION: 6 weeks (asking 8 weeks to accommodate scheduling)  PLANNED INTERVENTIONS: 02831 OT Re-evaluation, 97535 self care/ADL training, 02889 therapeutic exercise, 97530 therapeutic activity, 97112 neuromuscular re-education, 97140 manual therapy, 97035 ultrasound, 97032 electrical stimulation (manual), functional mobility training, psychosocial skills training, energy conservation, coping strategies training, patient/family education, and DME and/or AE instructions  RECOMMENDED OTHER SERVICES: NA  CONSULTED AND AGREED WITH PLAN OF CARE: Patient and family member/caregiver  PLAN FOR NEXT SESSION: massed practice with don/doff jacket; educate on modified technique for button up and pull over shirts, problem solve height of chair to increase ease with LB dressing and donning shoes, transfers with 90* turns and big steps, large amplitude exercises   Wyllow Seigler, OTR/L 06/21/2024, 10:27 AM  [facility] at [facility] [address] [place], [place] [address] Phone # [phone] Fax # [phone]

## 2024-06-23 ENCOUNTER — Ambulatory Visit: Admitting: Occupational Therapy

## 2024-06-23 DIAGNOSIS — R2689 Other abnormalities of gait and mobility: Secondary | ICD-10-CM | POA: Diagnosis not present

## 2024-06-23 DIAGNOSIS — G20A2 Parkinson's disease without dyskinesia, with fluctuations: Secondary | ICD-10-CM | POA: Diagnosis not present

## 2024-06-23 DIAGNOSIS — M6281 Muscle weakness (generalized): Secondary | ICD-10-CM

## 2024-06-23 DIAGNOSIS — R29818 Other symptoms and signs involving the nervous system: Secondary | ICD-10-CM

## 2024-06-23 DIAGNOSIS — R2681 Unsteadiness on feet: Secondary | ICD-10-CM

## 2024-06-23 DIAGNOSIS — R293 Abnormal posture: Secondary | ICD-10-CM | POA: Diagnosis not present

## 2024-06-23 NOTE — Therapy (Signed)
 OUTPATIENT OCCUPATIONAL THERAPY PARKINSON'S  Treatment Note  Patient Name: [name] MRN: [id number] DOB:Jan 14, 1936, 88 y.o., male Today's Date: 06/23/2024  PCP: Yolande Toribio MATSU, MD REFERRING PROVIDER: Cary No, NP  END OF SESSION:  OT End of Session - 06/23/24 1125     Visit Number 3    Number of Visits 10    Date for Recertification  07/30/24    Authorization Type Medicare A &B    OT Start Time 1020    OT Stop Time 1100    OT Time Calculation (min) 40 min            Past Medical History:  Diagnosis Date   Ankylosing spondylitis (HCC) dx'd ~ 1974   Arthritis    back (08/10/2015)   BENIGN PROSTATIC HYPERTROPHY, WITH OBSTRUCTION 01/15/2010   Bleeding duodenal ulcer    Bleeding esophageal ulcer    Bleeding stomach ulcer    GERD 02/22/2008   History of blood transfusion 08/26/1986   lost ~ 1/2 of my blood volume from multiple bleeding ulcers   History of hiatal hernia    HYPERGLYCEMIA 11/18/2007   HYPERLIPIDEMIA 02/22/2008   HYPERTENSION, UNSPECIFIED 11/10/2009   Kidney stones    Osteoarthritis    c-spine   Parkinson's disease (HCC)    PEPTIC ULCER DISEASE 11/17/2007   Situational depression    son died in MVA 2015-06-03   TIA (transient ischemic attack)    not that I know of in the past; they are trying to determine if I've had one today (08/10/2015)   Past Surgical History:  Procedure Laterality Date   CATARACT EXTRACTION W/ INTRAOCULAR LENS  IMPLANT, BILATERAL Bilateral 2013   Patient Active Problem List   Diagnosis Date Noted   Ankylosing spondylitis of cervicothoracic region (HCC) 10/16/2016   TIA (transient ischemic attack) 08/10/2015   Carotid stenosis 08/04/2014   Hyperlipidemia 02/21/2012   BENIGN PROSTATIC HYPERTROPHY, WITH OBSTRUCTION 01/15/2010   Essential hypertension 11/10/2009   GERD 02/22/2008   NEPHROLITHIASIS, HX OF 11/17/2007    ONSET DATE: referral date 05/07/24  REFERRING DIAG: G20.A2 (ICD-10-CM) - Parkinson's  disease with fluctuating manifestations, unspecified whether dyskinesia present  THERAPY DIAG:  Muscle weakness (generalized)  Unsteadiness on feet  Other symptoms and signs involving the nervous system  Rationale for Evaluation and Treatment: Rehabilitation  SUBJECTIVE:   SUBJECTIVE STATEMENT: Pt reports that he fell into the cat litter box in the bathroom again.    Pt accompanied by: self and significant other (spouse, [name])  PERTINENT HISTORY: history of Parkinson's disease, duodenal and esophageal ulcers, ankylosing spondylitis, hypertension and hyperlipidemia   PRECAUTIONS: Fall  WEIGHT BEARING RESTRICTIONS: No  PAIN:  Are you having pain? No  FALLS: Has patient fallen in last 6 months? Yes. Number of falls : falling more than once a week, most often in the bathroom or when attempting to walk without walker  LIVING ENVIRONMENT: Lives with: lives with their spouse Lives in: House/apartment Stairs: Yes: External: 2-3 steps; has a ramp at entrance with raised railings Internal: steps to basement and steps to loft - however pt does not need to go up/down those at this time. Has following equipment at home: [name] - 2 wheeled, Environmental Consultant - 4 wheeled, U-step, bed side commode, Shower chair, Ramped entry, and transport chair; 21 commode and arm rests that are attached to toilet seat, doorways have been widened  PLOF: Requires assistive device for independence and Needs assistance with ADLs; [place]  PATIENT GOALS:  safety with bathroom transfers and strategies for dressing tasks  OBJECTIVE:  Note: Objective measures were completed at Evaluation unless otherwise noted.  HAND DOMINANCE: Right  ADLs: Overall ADLs: fluctuates drastically; can take up to 2 hours for bathing and dressing Transfers/ambulation related to ADLs: Utilizing U-step for mobility in and outside of the home  Eating: he's doing well with the eating manageable UB  Dressing: occasional assistance, button up shirt is easier than pullover LB Dressing: getting dressed on a closed commode seat Toileting: Pt will occasionally require assistance to get up from toilet, has had falls attempting to transfer both to and from toilet Bathing: sponge bathing at sink without assistance from shower chair seated at sink, spouse assisting with washing hair; Setup for washing feet  Tub Shower transfers: not currently performing Equipment: Shower seat with back and Walk in shower with 5.5 ledge with 30 door  IADLs: Light housekeeping: washes dishes when he is feeling strong, however frequent onset of weakness Meal Prep: Pt was primary cook prior to fall in 2023, but has returned to participation in aspects of meal prep Community mobility: recently renewed drivers license, but is not driving  MOBILITY STATUS: Needs Assist: CGA with mobility with RW vs U-step and Hx of falls   POSTURE COMMENTS:  rounded shoulders and forward head, posterior pelvic tilt and R lean    FUNCTIONAL OUTCOME MEASURES:    UE ROM:  all movements are slowed, decreased shoulder flexion d/t ankylosing spondylitis   UE MMT:   grossly 3+ to 4/5 overall  COGNITION: Overall cognitive status: spouse reports thinking continues to be slower  OBSERVATIONS: Bradykinesia                                                                                                                    TREATMENT DATE:  06/23/24 Self-care: engaged in therapeutic discussion about frequency of falls and use of U-step with mobility.  Pt and spouse reporting falls seem to happen when he is taking a few steps to or from the U-step.  OT drew a mockup of pt's bathroom with his guidance to problem solve setup of U-step.  Engaged in massed practice with ambulating to counter in treatment gym to simulate home bathroom setup and positioning of U-step during transitional movements to/from counter.  OT educating on increased  proximity of U-step to counter top and pt to decrease need for steps away from U-step.  Pt verbalizing some concerns about placement, but demonstrating improvements in ease of transitional movements. Pt to attempt improved placement at home. Sit > stand: engaged in sit > stand from progressively lower surface mat table, progressing from 21.5 height to 20 height, lowering 0.5 at a time.  Pt demonstrating improvements with anterior weight shift and intermittent use of UE to adjust to fully upright posture when standing.      06/21/24 Don/doff jacket: pt required 2:10 and assist to advance jacket around shoulders when donning jacket.  Engaged in massed practice with donning/doffing jacket.  Pt unable  to advance jacket around shoulders independently, due to decreased shoulder ROM secondary to ankylosing spondylitis.  OT educating on various strategies to increase ease with donning jacket.  Pt demonstrating improvements in sitting with therapist aiding in advancing jacket around shoulders and then pt able to thread 2nd arm.  OT also educating on donning harder arm first, to allow more difficult movement to be completed by second arm.  Pt demonstrating improved technique with doffing jacket, by utilizing large amplitude movements to advance jacket off shoulders and then ability to reach behind and remove arms from sleeves. 5x/ sit > stand: 38.53 sec.  Pt utilizing no hands when coming up to stand, however stabilizing self on U step walker briefly for upright stance before returning to sit.  Engaged in discussion about recommended height for sit > stand in home and to increase ease with aspects of dressing.  Pt utilizing 21.5 mat table this session.      06/09/24: Educated on use of 90* turns when making transfers in bathroom. Educated on improvements in stepping pattern and turns, from previous therapy sessions, when turning 90* turns to allow for increased amplitude and decreased crossing of feet.     PATIENT EDUCATION: Education details: techniques for land Person educated: Patient and Spouse Education method: Medical Illustrator Education comprehension: verbalized understanding and needs further education  HOME EXERCISE PROGRAM: TBD  GOALS: Goals reviewed with patient? Yes  SHORT TERM GOALS: Target date: 07/02/24  Pt and spouse will be independent in full body HEP with focus on large amplitude movements as needed to maintain/preserve function as pertaining to ADLs/IADLs.  Baseline:  Goal status: in progress  2.  Pt will demonstrate ability to complete sequencing of approach to transfer and sit > stand as needed to get up from toilet or other seating options in home by completing x3 throughout session without cues or physical assistance to maintain/preserve function.  Baseline:  Goal status: in progress  3.  Pt and spouse will report understanding of DME vs adaptive techniques/strategies and/or modifications to routines to increase safety with mobility and self-care tasks in the home.  Baseline:  Goal status: in progress    LONG TERM GOALS: Target date: 07/30/24  Pt will demonstrate improved ease and independence with sit > stand as evidenced by completing 5x sit > stand without posterior LOB and improved time by 5 seconds. Baseline: TBD, heavy posterior lean with sit > stand on eval Goal status: in progress  2.  Pt will demonstrate and/or report ability to complete UB dressing (both button up shirt and pull over sweatshirt) with min assist or better with use of AE and/or alternative strategies PRN.  Baseline: pt reporting 5-7 on PSFS with UB dressing Goal status: in progress  3.  Pt will demonstrate and/or report ability to complete LB dressing (donning pants) with min assist or better with use of AE and/or alternative strategies PRN.  Baseline: pt reporting 5 on PSFS Goal status: in progress  4.  Pt will demonstrate and/or report ability  to complete LB dressing (donning shoes) with mod assist or better with use of AE and/or alternative strategies PRN.  Baseline: max-total assist for tie shoes and min assist to supervision for slip on - but prefers tie shoes for safety with walking Goal status: in progress  5.  Patient will report at least two-point increase in average PSFS score or at least three-point increase in a single activity score indicating functionally significant improvement given minimum detectable change. Baseline:  4.6 Goal status: in progress   ASSESSMENT:  CLINICAL IMPRESSION: Patient is a 88 y.o. male who was seen today for occupational therapy treatment for advancements in Parkinson's disease, increased falls, and decreased independence with ADLs/IADLs.  Pt benefits greatly from use of U-step with ambulation, demonstrating increased step length and pattern as well as transfers.  Pt has had another fall this morning in the bathroom.  After discussion and therapeutic listening, it seems that pt is falling when he has to take a few steps to transition from U-step to desired location.  Pt hesitant but spouse in agreement to continue to problem solve ways in which pt can keep U-step closer to himself and to destination to reduce risk of falling.  Pt completing sit > stand throughout session without UE support, even as progressing from 21.5 to 20.0 height surface. Pt will continue to benefit from skilled occupational therapy services to address strength and coordination, ROM, pain management, balance, GM/FM control, cognition, safety awareness, introduction of compensatory strategies/AE prn, and implementation of an HEP to improve participation and safety during ADLs and IADLs and quality of life. SABRA   PERFORMANCE DEFICITS: in functional skills including ADLs, IADLs, ROM, strength, flexibility, mobility, balance, body mechanics, decreased knowledge of precautions, decreased knowledge of use of DME, and UE functional use and  psychosocial skills including coping strategies, environmental adaptation, and routines and behaviors.   IMPAIRMENTS: are limiting patient from ADLs and IADLs.     PLAN:  OT FREQUENCY: 2x/week for 3 weeks, then 1x/week for remaining 3 weeks  OT DURATION: 6 weeks (asking 8 weeks to accommodate scheduling)  PLANNED INTERVENTIONS: 02831 OT Re-evaluation, 97535 self care/ADL training, 02889 therapeutic exercise, 97530 therapeutic activity, 97112 neuromuscular re-education, 97140 manual therapy, 97035 ultrasound, 97032 electrical stimulation (manual), functional mobility training, psychosocial skills training, energy conservation, coping strategies training, patient/family education, and DME and/or AE instructions  RECOMMENDED OTHER SERVICES: NA  CONSULTED AND AGREED WITH PLAN OF CARE: Patient and family member/caregiver  PLAN FOR NEXT SESSION: massed practice with don/doff jacket; educate on modified technique for button up and pull over shirts, problem solve height of chair to increase ease with LB dressing and donning shoes, transfers with 90* turns and big steps, large amplitude exercises   Lyssa Hackley, OTR/L 06/23/2024, 11:26 AM  [facility] [address] [place], [place] [address] Phone # [phone] Fax # [phone]

## 2024-06-28 NOTE — Therapy (Incomplete)
 OUTPATIENT PHYSICAL THERAPY NEURO TREATMENT  Patient Name: [name] MRN: [id number] DOB:[birth year], 88 y.o., male Today's Date: 06/28/2024   PCP: Yolande Toribio MATSU, MD REFERRING PROVIDER: Buck Saucer, MD      END OF SESSION:        Past Medical History:  Diagnosis Date   Ankylosing spondylitis (HCC) dx'd ~ 1974   Arthritis    back (08/10/2015)   BENIGN PROSTATIC HYPERTROPHY, WITH OBSTRUCTION 01/15/2010   Bleeding duodenal ulcer    Bleeding esophageal ulcer    Bleeding stomach ulcer    GERD 02/22/2008   History of blood transfusion 08/26/1986   lost ~ 1/2 of my blood volume from multiple bleeding ulcers   History of hiatal hernia    HYPERGLYCEMIA 11/18/2007   HYPERLIPIDEMIA 02/22/2008   HYPERTENSION, UNSPECIFIED 11/10/2009   Kidney stones    Osteoarthritis    c-spine   Parkinson's disease (HCC)    PEPTIC ULCER DISEASE 11/17/2007   Situational depression    son died in MVA 06-06-2015   TIA (transient ischemic attack)    not that I know of in the past; they are trying to determine if I've had one today (08/10/2015)   Past Surgical History:  Procedure Laterality Date   CATARACT EXTRACTION W/ INTRAOCULAR LENS  IMPLANT, BILATERAL Bilateral 2013   Patient Active Problem List   Diagnosis Date Noted   Ankylosing spondylitis of cervicothoracic region (HCC) 10/16/2016   TIA (transient ischemic attack) 08/10/2015   Carotid stenosis 08/04/2014   Hyperlipidemia 02/21/2012   BENIGN PROSTATIC HYPERTROPHY, WITH OBSTRUCTION 01/15/2010   Essential hypertension 11/10/2009   GERD 02/22/2008   NEPHROLITHIASIS, HX OF 11/17/2007    ONSET DATE: PD x years onset  REFERRING DIAG: R26.81 (ICD-10-CM) - Unsteadiness on feet R29.3 (ICD-10-CM) - Abnormal posture R27.8 (ICD-10-CM) - Other lack of coordination G20.A2 (ICD-10-CM) - Parkinson's disease with fluctuating manifestations, unspecified whether dyskinesia present (HCC)  THERAPY DIAG:  No diagnosis  found.  Rationale for Evaluation and Treatment: Rehabilitation  SUBJECTIVE:                                                                                                                                                                                             SUBJECTIVE STATEMENT: Doing ok, have had a few falls since last session, but no lasting injuries  Pt accompanied by: significant other  PERTINENT HISTORY: complex medical history of peptic ulcer disease with history of upper GI bleed, TIA, depression, degenerative disc disease in the neck, osteoarthritis, kidney stones, hyperlipidemia, hypertension, history of hiatal hernia, BPH, arthritis, and ankylosing spondylitis, and parkinsonism  PAIN:  Are you having pain?  Not really  PRECAUTIONS: Fall  RED FLAGS: None   WEIGHT BEARING RESTRICTIONS: No  FALLS: Has patient fallen in last 3 months? Unsure as to number, notes fall/LOB every other week  LIVING ENVIRONMENT: Lives with: lives with their spouse Lives in: House/apartment Stairs: Ramp to enter, stairs to loft and basement, ground floor set-up Has following equipment at home: [name] - 2 wheeled  PLOF: Needs assistance with ADLs, Needs assistance with homemaking, and Needs assistance with gait  PATIENT GOALS:   OBJECTIVE:     TODAY'S TREATMENT: 06/29/24 Activity Comments                        TODAY'S TREATMENT: 06/09/24 Activity Comments  NU-step level 3 x 6 min Maintaining 70+ SPM  strength Standing unilat row 3x10, 15# Seated row 3x10, 25# Seated lat row 3x10, 35#  Transfer training -trials w/ trunk flexion over BOS for sit to stand -auditory and visual cues for sequencing turns for chair to chair transfers              TODAY'S TREATMENT: 05/31/24 Activity Comments  TUG test w/ U-step 52 sec 43 sec  Berg Balance Test 28/56  Freezing of gait strategies Use of metronome to sequence steps for turning in place for turning to sit w/ U-step                TODAY'S TREATMENT: 05/21/24 Activity Comments  Gait training into/out of clinic with RW Cueing to kick and step and keep eyes ahead to mitigate freezing   standing PWR moves in II bars: up rock Twist (modified with cross body punch instead) step Use of mirror feedback. Manual cueing for large amplitude stretch with arms. Use of manual feedback to increase lateral wt shift with rock. Pt with difficulty completing LE pivot necessary to complete PWR twist, thus performed cross body punch instead. Use of stomp and visual targets on floor for PWR step  standing trunk twist with ball Cues to stop and reset posture and balance in midline; tendency for LOB with L>R rotation      PATIENT EDUCATION: Education details: discussed using U step when trying to carry items rather than going without AD Person educated: Patient and Spouse Education method: Explanation Education comprehension: verbalized understanding       Note: Objective measures were completed at Evaluation unless otherwise noted.  DIAGNOSTIC FINDINGS:   COGNITION: Overall cognitive status: Within functional limits for tasks assessed and some instances of difficulty attending to task/sequencing   SENSATION: Not tested  COORDINATION: Deficits to rapid alternating movements    MUSCLE TONE: rigidity  MUSCLE LENGTH: 10-15 degree knee flexion contracture bilat   POSTURE: rounded shoulders, forward head, and increased thoracic kyphosis  LOWER EXTREMITY ROM:     Right ankle ROM limits from fusion  LOWER EXTREMITY MMT:    4/5 gross BLE  BED MOBILITY:  Findings: NT, reports fluctuating performance  TRANSFERS: Sit to stand: Modified independence and CGA  Assistive device utilized: U-Step     Stand to sit: CGA  Assistive device utilized: u-step     Chair to chair: Modified independence and CGA  Assistive device utilized: U-step         CURB:  Findings: CGA-min A  STAIRS: Findings: Comments:  CGA-min A GAIT: Findings: Distance walked: 150, Assistive device utilized:U-step, Level of assistance: CGA, and Comments: freezing in turns  FUNCTIONAL TESTS:  5 times sit to stand: 47.25 sec Timed up and go (TUG):  36 sec w/ U-step 10 meter walk test: 23.34 sec = 1.4 ft/sec w/ U-step Lars Balance Scale:  180 degree turns: 16-18 steps  Item Test date: 01/20/24 Test date: 06/01/23 Test date:   Sitting to standing 2. able to stand using hands after several tries 2. able to stand using hands after several tries  3. able to stand 2 minutes with supervision  4. able to sit safely and securely for 2 minutes   2. uses back of legs against chair to control descent  2. able to transfer with verbal cuing and/or supervision  3. able to stand 10 seconds with supervision  3. able to place feet together independently and stand 1 minute with supervision  3. can reach forward 12 cm (5 inches)    3. able to pick up slipper but needs supervision  1. needs supervision when turning    1. needs close supervision or verbal cuing  1. able to complete > 2 steps needs minimal assist    0. loses balance while stepping or standing   0. unable to try of needs assist to prevent fall       Insert OPRCBERGREEVAL SmartPhrase at re-test date  2. Standing unsupported 3. able to stand 2 minutes with supervision    3. Sitting with back unsupported, feet supported 4. able to sit safely and securely for 2 minutes    4. Standing to sitting 2. uses back of legs against chair to control descent    5. Pivot transfer  2. able to transfer with verbal cuing and/or supervision    6. Standing unsupported with eyes closed 3. able to stand 10 seconds with supervision    7. Standing unsupported with feet together 3. able to place feet together independently and stand 1 minute with supervision    8. Reaching forward with outstretched arms while standing 3. can reach forward 12 cm (5 inches)    9. Pick up object from the floor  from standing 0. unable to try/needs assistance to keep from losing balance or falling    10. Turning to look behind over left and right shoulders while standing 2. turns sideways but only maintains balance    11. Turn 360 degrees 0. needs assistance while turning    12. Place alternate foot on step or stool while standing unsupported 1. able to complete > 2 steps needs minimal assist    13. Standing unsupported one foot in front 0. loses balance while stepping or standing    14. Standing on one leg 0. unable to try of needs assist to prevent fall      Total Score 25/56 Total Score  28 /56 Total Score /56     PATIENT SURVEYS:  Freezing of gait questionnaire provided to fill out and return  TREATMENT DATE:     PATIENT EDUCATION:a Education details: assessment details, rationale of tx interventions Person educated: Patient and Spouse Education method: Explanation Education comprehension: verbalized understanding  HOME EXERCISE PROGRAM: TBD  GOALS: Goals reviewed with patient? Yes  SHORT TERM GOALS: Target date: 06/21/2024      Patient will perform HEP with family/caregiver supervision for improved strength, balance, transfers, and gait  Baseline: Goal status: MET  2.  Demo improved postural control and BLE strength per time 35 sec 5xSTS test Baseline: 47 sec; 37 sec; 38 sec Goal status: IN PROGRESS 05/31/24    LONG TERM GOALS: Target date: 07/26/2024      Maintain score 28-30/56 Berg Balance Test for safety during ADL Baseline: 25/56; 27/56; 29/56; 28/56 Goal status: IN PROGRESS 05/31/24  2.  Demo improved safety/efficiency of turning negotiating 180 degree turn in 10-12 steps Baseline: 16-18 w/ U-step; 20-22 w/ U-step; 12-18 steps given min A for RW negotiation 04/13/24; 20+ steps and min A to prevent posterior LOB 05/04/24 Goal status: IN PROGRESS  05/04/24  3.  Demo improved safety with mobility per time 25 sec TUG test Baseline: 36 sec w/ U-step; 1 min 30 sec w/ U-step; 1 min 10 sec with RW and verbal cueing for safe turning/navigation; 47 sec w/ U-step Goal status: IN PROGRESS 05/31/24  4.  Spouse/patient to teach-back strategies to help with reducing freezing of gait to improve safety with turning to sit w/ U-step Baseline:  Goal status: IN PROGRESS   ASSESSMENT:  CLINICAL IMPRESSION: Strength and conditioning wit emphasis on postural strength to address postural fault for promoting extension/posterior chain. Transfer training strategies to improve mobility and reduce freezing of gait and retropulsion with best performance using visual cues for sequence vs auditory/tactile. Continued sessions to progress caregiver training and prevent functional decline OBJECTIVE IMPAIRMENTS: Abnormal gait, decreased activity tolerance, decreased balance, decreased coordination, decreased mobility, difficulty walking, decreased strength, improper body mechanics, and postural dysfunction.    PLAN:  PT FREQUENCY: 1x/week  PT DURATION: 6 weeks  PLANNED INTERVENTIONS: 97750- Physical Performance Testing, 97110-Therapeutic exercises, 97530- Therapeutic activity, [id number]- Neuromuscular re-education, 97535- Self Care, 02859- Manual therapy, [id number]- Gait training, and [phone]- Aquatic Therapy  PLAN FOR NEXT SESSION: metronome with U-step for sequencing steps for turning to perform stand to sit transfer

## 2024-06-29 ENCOUNTER — Ambulatory Visit: Admitting: Occupational Therapy

## 2024-06-29 ENCOUNTER — Ambulatory Visit: Attending: Family Medicine

## 2024-06-29 DIAGNOSIS — R29818 Other symptoms and signs involving the nervous system: Secondary | ICD-10-CM

## 2024-06-29 DIAGNOSIS — R2681 Unsteadiness on feet: Secondary | ICD-10-CM | POA: Diagnosis not present

## 2024-06-29 DIAGNOSIS — R293 Abnormal posture: Secondary | ICD-10-CM

## 2024-06-29 DIAGNOSIS — R2689 Other abnormalities of gait and mobility: Secondary | ICD-10-CM | POA: Insufficient documentation

## 2024-06-29 DIAGNOSIS — M6281 Muscle weakness (generalized): Secondary | ICD-10-CM

## 2024-06-29 DIAGNOSIS — R262 Difficulty in walking, not elsewhere classified: Secondary | ICD-10-CM | POA: Insufficient documentation

## 2024-06-29 DIAGNOSIS — G20A2 Parkinson's disease without dyskinesia, with fluctuations: Secondary | ICD-10-CM | POA: Diagnosis not present

## 2024-06-29 NOTE — Therapy (Signed)
 OUTPATIENT OCCUPATIONAL THERAPY PARKINSON'S  Treatment Note  Patient Name: [name] MRN: [id number] DOB:[birth year], 88 y.o., male Today's Date: 06/29/2024  PCP: Yolande Toribio MATSU, MD REFERRING PROVIDER: Cary No, NP  END OF SESSION:  OT End of Session - 06/29/24 1439     Visit Number 4    Number of Visits 10    Date for Recertification  07/30/24    Authorization Type Medicare A &B    OT Start Time 1236    OT Stop Time 1318    OT Time Calculation (min) 42 min             Past Medical History:  Diagnosis Date   Ankylosing spondylitis (HCC) dx'd ~ 1974   Arthritis    back (08/10/2015)   BENIGN PROSTATIC HYPERTROPHY, WITH OBSTRUCTION 01/15/2010   Bleeding duodenal ulcer    Bleeding esophageal ulcer    Bleeding stomach ulcer    GERD 02/22/2008   History of blood transfusion 08/26/1986   lost ~ 1/2 of my blood volume from multiple bleeding ulcers   History of hiatal hernia    HYPERGLYCEMIA 11/18/2007   HYPERLIPIDEMIA 02/22/2008   HYPERTENSION, UNSPECIFIED 11/10/2009   Kidney stones    Osteoarthritis    c-spine   Parkinson's disease (HCC)    PEPTIC ULCER DISEASE 11/17/2007   Situational depression    son died in MVA 06-25-2015   TIA (transient ischemic attack)    not that I know of in the past; they are trying to determine if I've had one today (08/10/2015)   Past Surgical History:  Procedure Laterality Date   CATARACT EXTRACTION W/ INTRAOCULAR LENS  IMPLANT, BILATERAL Bilateral 2013   Patient Active Problem List   Diagnosis Date Noted   Ankylosing spondylitis of cervicothoracic region (HCC) 10/16/2016   TIA (transient ischemic attack) 08/10/2015   Carotid stenosis 08/04/2014   Hyperlipidemia 02/21/2012   BENIGN PROSTATIC HYPERTROPHY, WITH OBSTRUCTION 01/15/2010   Essential hypertension 11/10/2009   GERD 02/22/2008   NEPHROLITHIASIS, HX OF 11/17/2007    ONSET DATE: referral date 05/07/24  REFERRING DIAG: G20.A2 (ICD-10-CM) - Parkinson's  disease with fluctuating manifestations, unspecified whether dyskinesia present  THERAPY DIAG:  Other symptoms and signs involving the nervous system  Muscle weakness (generalized)  Abnormal posture  Rationale for Evaluation and Treatment: Rehabilitation  SUBJECTIVE:   SUBJECTIVE STATEMENT: Pt's spouse reporting pt taking increased time with dressing tasks this morning.  Pt accompanied by: self and significant other (spouse, [name])  PERTINENT HISTORY: history of Parkinson's disease, duodenal and esophageal ulcers, ankylosing spondylitis, hypertension and hyperlipidemia   PRECAUTIONS: Fall  WEIGHT BEARING RESTRICTIONS: No  PAIN:  Are you having pain? No  FALLS: Has patient fallen in last 6 months? Yes. Number of falls : falling more than once a week, most often in the bathroom or when attempting to walk without walker  LIVING ENVIRONMENT: Lives with: lives with their spouse Lives in: House/apartment Stairs: Yes: External: 2-3 steps; has a ramp at entrance with raised railings Internal: steps to basement and steps to loft - however pt does not need to go up/down those at this time. Has following equipment at home: [name] - 2 wheeled, Environmental Consultant - 4 wheeled, U-step, bed side commode, Shower chair, Ramped entry, and transport chair; 21 commode and arm rests that are attached to toilet seat, doorways have been widened  PLOF: Requires assistive device for independence and Needs assistance with ADLs; [place]  PATIENT GOALS: safety with bathroom transfers  and strategies for dressing tasks  OBJECTIVE:  Note: Objective measures were completed at Evaluation unless otherwise noted.  HAND DOMINANCE: Right  ADLs: Overall ADLs: fluctuates drastically; can take up to 2 hours for bathing and dressing Transfers/ambulation related to ADLs: Utilizing U-step for mobility in and outside of the home  Eating: he's doing well with the eating manageable UB  Dressing: occasional assistance, button up shirt is easier than pullover LB Dressing: getting dressed on a closed commode seat Toileting: Pt will occasionally require assistance to get up from toilet, has had falls attempting to transfer both to and from toilet Bathing: sponge bathing at sink without assistance from shower chair seated at sink, spouse assisting with washing hair; Setup for washing feet  Tub Shower transfers: not currently performing Equipment: Shower seat with back and Walk in shower with 5.5 ledge with 30 door  IADLs: Light housekeeping: washes dishes when he is feeling strong, however frequent onset of weakness Meal Prep: Pt was primary cook prior to fall in 2023, but has returned to participation in aspects of meal prep Community mobility: recently renewed drivers license, but is not driving  MOBILITY STATUS: Needs Assist: CGA with mobility with RW vs U-step and Hx of falls   POSTURE COMMENTS:  rounded shoulders and forward head, posterior pelvic tilt and R lean    FUNCTIONAL OUTCOME MEASURES:    UE ROM:  all movements are slowed, decreased shoulder flexion d/t ankylosing spondylitis   UE MMT:   grossly 3+ to 4/5 overall  COGNITION: Overall cognitive status: spouse reports thinking continues to be slower  OBSERVATIONS: Bradykinesia                                                                                                                    TREATMENT DATE:  06/29/24 Transitional movements: Attempted to engage in transfers and sit > stand intermittently throughout session and at beginning/end of session with fewer cues.  Pt demonstrating very slow and shuffled gait when transitioning from PT to OT session and when exiting therapy gym at the end of the session.  OT providing mod cues for big steps with ambulation and min tactile cues at back during sit > stand due to increased posterior and right lean this session. Large amplitude: engaged in modified LSVT  big exercises to carry over to decreased burden of care with transitional movements and dressing. Pt benefiting from targets and tactile cues for forward and downward reach as well as verbal cues and targets for improved upright posture when reaching up and out to side.  Pt demonstrating decreased speed with movements and requiring mod verbal cues throughout.  Pt also demonstrating right lean in sitting benefiting from targets to facilitate increased upright posture/positioning. Don/doff jacket:  pt requiring significantly increased time with initiation and sequencing with donning jacket this session.  Pt benefiting from demonstration and cues, however still requiring mod-max assist for donning jacket this session.  Pt with posterior lean resulting in LOB backwards requiring assistance to lower to  sit when attempting to stand to adjust jacket down back.     06/23/24 Self-care: engaged in therapeutic discussion about frequency of falls and use of U-step with mobility.  Pt and spouse reporting falls seem to happen when he is taking a few steps to or from the U-step.  OT drew a mockup of pt's bathroom with his guidance to problem solve setup of U-step.  Engaged in massed practice with ambulating to counter in treatment gym to simulate home bathroom setup and positioning of U-step during transitional movements to/from counter.  OT educating on increased proximity of U-step to counter top and pt to decrease need for steps away from U-step.  Pt verbalizing some concerns about placement, but demonstrating improvements in ease of transitional movements. Pt to attempt improved placement at home. Sit > stand: engaged in sit > stand from progressively lower surface mat table, progressing from 21.5 height to 20 height, lowering 0.5 at a time.  Pt demonstrating improvements with anterior weight shift and intermittent use of UE to adjust to fully upright posture when standing.      06/21/24 Don/doff jacket: pt  required 2:10 and assist to advance jacket around shoulders when donning jacket.  Engaged in massed practice with donning/doffing jacket.  Pt unable to advance jacket around shoulders independently, due to decreased shoulder ROM secondary to ankylosing spondylitis.  OT educating on various strategies to increase ease with donning jacket.  Pt demonstrating improvements in sitting with therapist aiding in advancing jacket around shoulders and then pt able to thread 2nd arm.  OT also educating on donning harder arm first, to allow more difficult movement to be completed by second arm.  Pt demonstrating improved technique with doffing jacket, by utilizing large amplitude movements to advance jacket off shoulders and then ability to reach behind and remove arms from sleeves. 5x/ sit > stand: 38.53 sec.  Pt utilizing no hands when coming up to stand, however stabilizing self on U step walker briefly for upright stance before returning to sit.  Engaged in discussion about recommended height for sit > stand in home and to increase ease with aspects of dressing.  Pt utilizing 21.5 mat table this session.      PATIENT EDUCATION: Education details: techniques for land Person educated: Patient and Spouse Education method: Medical Illustrator Education comprehension: verbalized understanding and needs further education  HOME EXERCISE PROGRAM: TBD  GOALS: Goals reviewed with patient? Yes  SHORT TERM GOALS: Target date: 07/02/24  Pt and spouse will be independent in full body HEP with focus on large amplitude movements as needed to maintain/preserve function as pertaining to ADLs/IADLs.  Baseline:  Goal status: in progress  2.  Pt will demonstrate ability to complete sequencing of approach to transfer and sit > stand as needed to get up from toilet or other seating options in home by completing x3 throughout session without cues or physical assistance to maintain/preserve  function.  Baseline:  Goal status: in progress  3.  Pt and spouse will report understanding of DME vs adaptive techniques/strategies and/or modifications to routines to increase safety with mobility and self-care tasks in the home.  Baseline:  Goal status: in progress    LONG TERM GOALS: Target date: 07/30/24  Pt will demonstrate improved ease and independence with sit > stand as evidenced by completing 5x sit > stand without posterior LOB and improved time by 5 seconds. Baseline: TBD, heavy posterior lean with sit > stand on eval Goal status: in progress  2.  Pt will demonstrate and/or report ability to complete UB dressing (both button up shirt and pull over sweatshirt) with min assist or better with use of AE and/or alternative strategies PRN.  Baseline: pt reporting 5-7 on PSFS with UB dressing Goal status: in progress  3.  Pt will demonstrate and/or report ability to complete LB dressing (donning pants) with min assist or better with use of AE and/or alternative strategies PRN.  Baseline: pt reporting 5 on PSFS Goal status: in progress  4.  Pt will demonstrate and/or report ability to complete LB dressing (donning shoes) with mod assist or better with use of AE and/or alternative strategies PRN.  Baseline: max-total assist for tie shoes and min assist to supervision for slip on - but prefers tie shoes for safety with walking Goal status: in progress  5.  Patient will report at least two-point increase in average PSFS score or at least three-point increase in a single activity score indicating functionally significant improvement given minimum detectable change. Baseline: 4.6 Goal status: in progress   ASSESSMENT:  CLINICAL IMPRESSION: Patient is a 88 y.o. male who was seen today for occupational therapy treatment for advancements in Parkinson's disease, increased falls, and decreased independence with ADLs/IADLs.  Pt benefits greatly from use of U-step with ambulation, however  much slower with smaller steps this date.  Pt benefiting from cues for larger steps and anterior weight shift for sit > stand.  Pt demonstrating increased right lean and slower response time throughout entire session, OT questioning pt's quality of sleep vs change in medications.  Pt with increased difficulty with all tasks from familiar LSVT exercise to practice with jacket this session due to right lean and slower response time.  Pt will continue to benefit from skilled occupational therapy services to address strength and coordination, ROM, pain management, balance, GM/FM control, cognition, safety awareness, introduction of compensatory strategies/AE prn, and implementation of an HEP to improve participation and safety during ADLs and IADLs and quality of life. SABRA   PERFORMANCE DEFICITS: in functional skills including ADLs, IADLs, ROM, strength, flexibility, mobility, balance, body mechanics, decreased knowledge of precautions, decreased knowledge of use of DME, and UE functional use and psychosocial skills including coping strategies, environmental adaptation, and routines and behaviors.   IMPAIRMENTS: are limiting patient from ADLs and IADLs.     PLAN:  OT FREQUENCY: 2x/week for 3 weeks, then 1x/week for remaining 3 weeks  OT DURATION: 6 weeks (asking 8 weeks to accommodate scheduling)  PLANNED INTERVENTIONS: 02831 OT Re-evaluation, 97535 self care/ADL training, 02889 therapeutic exercise, 97530 therapeutic activity, 97112 neuromuscular re-education, 97140 manual therapy, 97035 ultrasound, 97032 electrical stimulation (manual), functional mobility training, psychosocial skills training, energy conservation, coping strategies training, patient/family education, and DME and/or AE instructions  RECOMMENDED OTHER SERVICES: NA  CONSULTED AND AGREED WITH PLAN OF CARE: Patient and family member/caregiver  PLAN FOR NEXT SESSION: massed practice with don/doff jacket; educate on modified technique for  button up and pull over shirts, problem solve height of chair to increase ease with LB dressing and donning shoes, transfers with 90* turns and big steps, large amplitude exercises   Izabell Schalk, OTR/L 06/29/2024, 2:39 PM  [facility] at [facility] [address], [address] [place], [place] [address] Phone # [phone] Fax # [phone]

## 2024-06-29 NOTE — Therapy (Signed)
 OUTPATIENT PHYSICAL THERAPY NEURO TREATMENT  Patient Name: [name] MRN: [id number] DOB:1936-08-26, 88 y.o., male Today's Date: 06/29/2024   PCP: Yolande Toribio MATSU, MD REFERRING PROVIDER: Buck Saucer, MD      END OF SESSION:  PT End of Session - 06/29/24 1147     Visit Number 19    Number of Visits 23    Date for Recertification  07/26/24    Authorization Type Medicare/[employer]    Progress Note Due on Visit 27    PT Start Time 1147    PT Stop Time 1230    PT Time Calculation (min) 43 min    Equipment Utilized During Treatment Gait belt    Activity Tolerance Patient tolerated treatment well    Behavior During Therapy WFL for tasks assessed/performed              Past Medical History:  Diagnosis Date   Ankylosing spondylitis (HCC) dx'd ~ 1974   Arthritis    back (08/10/2015)   BENIGN PROSTATIC HYPERTROPHY, WITH OBSTRUCTION 01/15/2010   Bleeding duodenal ulcer    Bleeding esophageal ulcer    Bleeding stomach ulcer    GERD 02/22/2008   History of blood transfusion 08/26/1986   lost ~ 1/2 of my blood volume from multiple bleeding ulcers   History of hiatal hernia    HYPERGLYCEMIA 11/18/2007   HYPERLIPIDEMIA 02/22/2008   HYPERTENSION, UNSPECIFIED 11/10/2009   Kidney stones    Osteoarthritis    c-spine   Parkinson's disease (HCC)    PEPTIC ULCER DISEASE 11/17/2007   Situational depression    son died in MVA 2015-06-21   TIA (transient ischemic attack)    not that I know of in the past; they are trying to determine if I've had one today (08/10/2015)   Past Surgical History:  Procedure Laterality Date   CATARACT EXTRACTION W/ INTRAOCULAR LENS  IMPLANT, BILATERAL Bilateral 2013   Patient Active Problem List   Diagnosis Date Noted   Ankylosing spondylitis of cervicothoracic region (HCC) 10/16/2016   TIA (transient ischemic attack) 08/10/2015   Carotid stenosis 08/04/2014   Hyperlipidemia 02/21/2012   BENIGN PROSTATIC HYPERTROPHY, WITH OBSTRUCTION  01/15/2010   Essential hypertension 11/10/2009   GERD 02/22/2008   NEPHROLITHIASIS, HX OF 11/17/2007    ONSET DATE: PD x years onset  REFERRING DIAG: R26.81 (ICD-10-CM) - Unsteadiness on feet R29.3 (ICD-10-CM) - Abnormal posture R27.8 (ICD-10-CM) - Other lack of coordination G20.A2 (ICD-10-CM) - Parkinson's disease with fluctuating manifestations, unspecified whether dyskinesia present (HCC)  THERAPY DIAG:  Muscle weakness (generalized)  Unsteadiness on feet  Other symptoms and signs involving the nervous system  Abnormal posture  Other abnormalities of gait and mobility  Parkinson's disease with fluctuating manifestations, unspecified whether dyskinesia present (HCC)  Difficulty in walking, not elsewhere classified  Rationale for Evaluation and Treatment: Rehabilitation  SUBJECTIVE:  SUBJECTIVE STATEMENT: Have not had any falls in past week.  More frequently using U-step  Pt accompanied by: significant other  PERTINENT HISTORY: complex medical history of peptic ulcer disease with history of upper GI bleed, TIA, depression, degenerative disc disease in the neck, osteoarthritis, kidney stones, hyperlipidemia, hypertension, history of hiatal hernia, BPH, arthritis, and ankylosing spondylitis, and parkinsonism  PAIN:  Are you having pain? Not really  PRECAUTIONS: Fall  RED FLAGS: None   WEIGHT BEARING RESTRICTIONS: No  FALLS: Has patient fallen in last 3 months? Unsure as to number, notes fall/LOB every other week  LIVING ENVIRONMENT: Lives with: lives with their spouse Lives in: House/apartment Stairs: Ramp to enter, stairs to loft and basement, ground floor set-up Has following equipment at home: [name] - 2 wheeled  PLOF: Needs assistance with ADLs, Needs assistance with  homemaking, and Needs assistance with gait  PATIENT GOALS:   OBJECTIVE:   TODAY'S TREATMENT: 06/29/24 Activity Comments  NU-step level 5 x 8 min  70-80 SPM  Slalom gait around obstacles Tehcniques for step initiation   Sit to stand transfers W/ red resistance for encouraging trunk flexion over BOS  High step forward march/retrowalk 2x2 min Parallel bars and CGA  Sidestep w/ BUE support 2x2 min Trials with tactile cues for sequencing/position. Trial w/ visual cues for sequence/foot placement  Standing hip flexion 1x20 reps 3#, CGA-min A for control, visual cue for height     TODAY'S TREATMENT: 06/09/24 Activity Comments  NU-step level 3 x 6 min Maintaining 70+ SPM  strength Standing unilat row 3x10, 15# Seated row 3x10, 25# Seated lat row 3x10, 35#  Transfer training -trials w/ trunk flexion over BOS for sit to stand -auditory and visual cues for sequencing turns for chair to chair transfers                PATIENT EDUCATION: Education details: discussed using U step when trying to carry items rather than going without AD Person educated: Patient and Spouse Education method: Explanation Education comprehension: verbalized understanding       Note: Objective measures were completed at Evaluation unless otherwise noted.  DIAGNOSTIC FINDINGS:   COGNITION: Overall cognitive status: Within functional limits for tasks assessed and some instances of difficulty attending to task/sequencing   SENSATION: Not tested  COORDINATION: Deficits to rapid alternating movements    MUSCLE TONE: rigidity  MUSCLE LENGTH: 10-15 degree knee flexion contracture bilat   POSTURE: rounded shoulders, forward head, and increased thoracic kyphosis  LOWER EXTREMITY ROM:     Right ankle ROM limits from fusion  LOWER EXTREMITY MMT:    4/5 gross BLE  BED MOBILITY:  Findings: NT, reports fluctuating performance  TRANSFERS: Sit to stand: Modified independence and CGA  Assistive  device utilized: U-Step     Stand to sit: CGA  Assistive device utilized: u-step     Chair to chair: Modified independence and CGA  Assistive device utilized: U-step         CURB:  Findings: CGA-min A  STAIRS: Findings: Comments: CGA-min A GAIT: Findings: Distance walked: 150, Assistive device utilized:U-step, Level of assistance: CGA, and Comments: freezing in turns  FUNCTIONAL TESTS:  5 times sit to stand: 47.25 sec Timed up and go (TUG): 36 sec w/ U-step 10 meter walk test: 23.34 sec = 1.4 ft/sec w/ Shan Levins Balance Scale:  180 degree turns: 16-18 steps  Item Test date: 01/20/24 Test date: 06/01/23 Test date:   Sitting to standing 2. able to stand using hands after  several tries 2. able to stand using hands after several tries  3. able to stand 2 minutes with supervision  4. able to sit safely and securely for 2 minutes   2. uses back of legs against chair to control descent  2. able to transfer with verbal cuing and/or supervision  3. able to stand 10 seconds with supervision  3. able to place feet together independently and stand 1 minute with supervision  3. can reach forward 12 cm (5 inches)    3. able to pick up slipper but needs supervision  1. needs supervision when turning    1. needs close supervision or verbal cuing  1. able to complete > 2 steps needs minimal assist    0. loses balance while stepping or standing   0. unable to try of needs assist to prevent fall       Insert OPRCBERGREEVAL SmartPhrase at re-test date  2. Standing unsupported 3. able to stand 2 minutes with supervision    3. Sitting with back unsupported, feet supported 4. able to sit safely and securely for 2 minutes    4. Standing to sitting 2. uses back of legs against chair to control descent    5. Pivot transfer  2. able to transfer with verbal cuing and/or supervision    6. Standing unsupported with eyes closed 3. able to stand 10 seconds with supervision    7. Standing  unsupported with feet together 3. able to place feet together independently and stand 1 minute with supervision    8. Reaching forward with outstretched arms while standing 3. can reach forward 12 cm (5 inches)    9. Pick up object from the floor from standing 0. unable to try/needs assistance to keep from losing balance or falling    10. Turning to look behind over left and right shoulders while standing 2. turns sideways but only maintains balance    11. Turn 360 degrees 0. needs assistance while turning    12. Place alternate foot on step or stool while standing unsupported 1. able to complete > 2 steps needs minimal assist    13. Standing unsupported one foot in front 0. loses balance while stepping or standing    14. Standing on one leg 0. unable to try of needs assist to prevent fall      Total Score 25/56 Total Score  28 /56 Total Score /56     PATIENT SURVEYS:  Freezing of gait questionnaire provided to fill out and return                                                                                                                              TREATMENT DATE:     PATIENT EDUCATION:a Education details: assessment details, rationale of tx interventions Person educated: Patient and Spouse Education method: Explanation Education comprehension: verbalized understanding  HOME EXERCISE PROGRAM: TBD  GOALS: Goals reviewed with patient? Yes  SHORT TERM  GOALS: Target date: 06/21/2024      Patient will perform HEP with family/caregiver supervision for improved strength, balance, transfers, and gait  Baseline: Goal status: MET  2.  Demo improved postural control and BLE strength per time 35 sec 5xSTS test Baseline: 47 sec; 37 sec; 38 sec Goal status: IN PROGRESS 05/31/24    LONG TERM GOALS: Target date: 07/26/2024      Maintain score 28-30/56 Berg Balance Test for safety during ADL Baseline: 25/56; 27/56; 29/56; 28/56 Goal status: IN PROGRESS 05/31/24  2.  Demo improved  safety/efficiency of turning negotiating 180 degree turn in 10-12 steps Baseline: 16-18 w/ U-step; 20-22 w/ U-step; 12-18 steps given min A for RW negotiation 04/13/24; 20+ steps and min A to prevent posterior LOB 05/04/24 Goal status: IN PROGRESS 05/04/24  3.  Demo improved safety with mobility per time 25 sec TUG test Baseline: 36 sec w/ U-step; 1 min 30 sec w/ U-step; 1 min 10 sec with RW and verbal cueing for safe turning/navigation; 47 sec w/ U-step Goal status: IN PROGRESS 05/31/24  4.  Spouse/patient to teach-back strategies to help with reducing freezing of gait to improve safety with turning to sit w/ U-step Baseline:  Goal status: IN PROGRESS   ASSESSMENT:  CLINICAL IMPRESSION: Training to maintain functional mobility and techniques/strategies for increasing amplitude of movement and instruction to pt and caregiver for home practice of resisted sit to stand using theraband pulling posteriorly to strengthen legs and provide tactile cue for trunk flexion over BOS to address retropulsion.  Dynamic balance and coordination activities to promote single limb control and coordination requiring heavy cues for visual/tactile/verbal reference for sequencing. Physical assistance needed for instances of retropulsion during standing tasks w/ CGA-min A to correct posterior LOB and cues for postural adjustment.  Gait training strategies for initiating ambulation due to freezing of gait w/ best carryover to using visual cues. Continued sessions indicated for skilled maintenance to help with ongoing exercise and mobility to prevent rapid and significant decline in functional mobility, strength, and reduce risk for falls  OBJECTIVE IMPAIRMENTS: Abnormal gait, decreased activity tolerance, decreased balance, decreased coordination, decreased mobility, difficulty walking, decreased strength, improper body mechanics, and postural dysfunction.    PLAN:  PT FREQUENCY: 1x/week  PT DURATION: 6 weeks  PLANNED  INTERVENTIONS: 97750- Physical Performance Testing, 97110-Therapeutic exercises, 97530- Therapeutic activity, [id number]- Neuromuscular re-education, 97535- Self Care, [id number]- Manual therapy, [phone]- Gait training, and [phone]- Aquatic Therapy  PLAN FOR NEXT SESSION: metronome with U-step for sequencing steps for turning to perform stand to sit transfer  11:47 AM, 06/29/24 M. Kelly Asahel Risden, PT, DPT Physical Therapist- [place] Office Number: [phone]

## 2024-07-01 ENCOUNTER — Ambulatory Visit: Admitting: Occupational Therapy

## 2024-07-01 DIAGNOSIS — M6281 Muscle weakness (generalized): Secondary | ICD-10-CM

## 2024-07-01 DIAGNOSIS — R293 Abnormal posture: Secondary | ICD-10-CM | POA: Diagnosis not present

## 2024-07-01 DIAGNOSIS — R29818 Other symptoms and signs involving the nervous system: Secondary | ICD-10-CM | POA: Diagnosis not present

## 2024-07-01 DIAGNOSIS — G20A2 Parkinson's disease without dyskinesia, with fluctuations: Secondary | ICD-10-CM | POA: Diagnosis not present

## 2024-07-01 DIAGNOSIS — R2689 Other abnormalities of gait and mobility: Secondary | ICD-10-CM | POA: Diagnosis not present

## 2024-07-01 DIAGNOSIS — R2681 Unsteadiness on feet: Secondary | ICD-10-CM | POA: Diagnosis not present

## 2024-07-01 NOTE — Therapy (Signed)
 OUTPATIENT OCCUPATIONAL THERAPY PARKINSON'S  Treatment Note  Patient Name: [name] MRN: [id number] DOB:24-Nov-1935, 88 y.o., male Today's Date: 07/01/2024  PCP: Yolande Toribio MATSU, MD REFERRING PROVIDER: Cary No, NP  END OF SESSION:  OT End of Session - 07/01/24 1210     Visit Number 5    Number of Visits 10    Date for Recertification  07/30/24    Authorization Type Medicare A &B    OT Start Time 1102    OT Stop Time 1144    OT Time Calculation (min) 42 min              Past Medical History:  Diagnosis Date   Ankylosing spondylitis (HCC) dx'd ~ 1974   Arthritis    back (08/10/2015)   BENIGN PROSTATIC HYPERTROPHY, WITH OBSTRUCTION 01/15/2010   Bleeding duodenal ulcer    Bleeding esophageal ulcer    Bleeding stomach ulcer    GERD 02/22/2008   History of blood transfusion 08/26/1986   lost ~ 1/2 of my blood volume from multiple bleeding ulcers   History of hiatal hernia    HYPERGLYCEMIA 11/18/2007   HYPERLIPIDEMIA 02/22/2008   HYPERTENSION, UNSPECIFIED 11/10/2009   Kidney stones    Osteoarthritis    c-spine   Parkinson's disease (HCC)    PEPTIC ULCER DISEASE 11/17/2007   Situational depression    son died in MVA 06-27-2015   TIA (transient ischemic attack)    not that I know of in the past; they are trying to determine if I've had one today (08/10/2015)   Past Surgical History:  Procedure Laterality Date   CATARACT EXTRACTION W/ INTRAOCULAR LENS  IMPLANT, BILATERAL Bilateral 2013   Patient Active Problem List   Diagnosis Date Noted   Ankylosing spondylitis of cervicothoracic region (HCC) 10/16/2016   TIA (transient ischemic attack) 08/10/2015   Carotid stenosis 08/04/2014   Hyperlipidemia 02/21/2012   BENIGN PROSTATIC HYPERTROPHY, WITH OBSTRUCTION 01/15/2010   Essential hypertension 11/10/2009   GERD 02/22/2008   NEPHROLITHIASIS, HX OF 11/17/2007    ONSET DATE: referral date 05/07/24  REFERRING DIAG: G20.A2 (ICD-10-CM) - Parkinson's  disease with fluctuating manifestations, unspecified whether dyskinesia present  THERAPY DIAG:  Other symptoms and signs involving the nervous system  Muscle weakness (generalized)  Rationale for Evaluation and Treatment: Rehabilitation  SUBJECTIVE:   SUBJECTIVE STATEMENT: Pt's spouse reporting that pt worked out with the trainer at the gym yesterday.  Pt accompanied by: self and significant other (spouse, [name])  PERTINENT HISTORY: history of Parkinson's disease, duodenal and esophageal ulcers, ankylosing spondylitis, hypertension and hyperlipidemia   PRECAUTIONS: Fall  WEIGHT BEARING RESTRICTIONS: No  PAIN:  Are you having pain? No  FALLS: Has patient fallen in last 6 months? Yes. Number of falls : falling more than once a week, most often in the bathroom or when attempting to walk without walker  LIVING ENVIRONMENT: Lives with: lives with their spouse Lives in: House/apartment Stairs: Yes: External: 2-3 steps; has a ramp at entrance with raised railings Internal: steps to basement and steps to loft - however pt does not need to go up/down those at this time. Has following equipment at home: [name] - 2 wheeled, Environmental Consultant - 4 wheeled, U-step, bed side commode, Shower chair, Ramped entry, and transport chair; 21 commode and arm rests that are attached to toilet seat, doorways have been widened  PLOF: Requires assistive device for independence and Needs assistance with ADLs; [place]  PATIENT GOALS: safety with bathroom transfers  and strategies for dressing tasks  OBJECTIVE:  Note: Objective measures were completed at Evaluation unless otherwise noted.  HAND DOMINANCE: Right  ADLs: Overall ADLs: fluctuates drastically; can take up to 2 hours for bathing and dressing Transfers/ambulation related to ADLs: Utilizing U-step for mobility in and outside of the home  Eating: he's doing well with the eating manageable UB Dressing: occasional  assistance, button up shirt is easier than pullover LB Dressing: getting dressed on a closed commode seat Toileting: Pt will occasionally require assistance to get up from toilet, has had falls attempting to transfer both to and from toilet Bathing: sponge bathing at sink without assistance from shower chair seated at sink, spouse assisting with washing hair; Setup for washing feet  Tub Shower transfers: not currently performing Equipment: Shower seat with back and Walk in shower with 5.5 ledge with 30 door  IADLs: Light housekeeping: washes dishes when he is feeling strong, however frequent onset of weakness Meal Prep: Pt was primary cook prior to fall in 2023, but has returned to participation in aspects of meal prep Community mobility: recently renewed drivers license, but is not driving  MOBILITY STATUS: Needs Assist: CGA with mobility with RW vs U-step and Hx of falls   POSTURE COMMENTS:  rounded shoulders and forward head, posterior pelvic tilt and R lean    FUNCTIONAL OUTCOME MEASURES:    UE ROM:  all movements are slowed, decreased shoulder flexion d/t ankylosing spondylitis   UE MMT:   grossly 3+ to 4/5 overall  COGNITION: Overall cognitive status: spouse reports thinking continues to be slower  OBSERVATIONS: Bradykinesia                                                                                                                    TREATMENT DATE:  07/01/24 Large amplitude: forward weight shifting with hands on large therapy ball.  OT providing demonstration and cues, requiring increased cues for technique as pt with tendency to push down vs forward with ball.  Transitioned to reaching outside BOS and across midline in sitting with focus on trunk rotation and weight shifting while reaching towards vertical poles.  OT providing demonstration, close supervision, and cues for sequencing and amplitude.  Pt benefiting from cues throughout for amplitude and postural control  to increase weight shifting and reach.  Pt benefits from targets for increased reach. Card sorting: engaged in flipping cards with RUE and then LUE with focus on large amplitude reach, external rotation.  OT providing cues to attend to stack of cards to increase attention and awareness as pt demonstrating decreasing ROM as he fatigued. OT educating on use of plate at home to allow for increased awareness if missing the target.   06/29/24 Transitional movements: Attempted to engage in transfers and sit > stand intermittently throughout session and at beginning/end of session with fewer cues.  Pt demonstrating very slow and shuffled gait when transitioning from PT to OT session and when exiting therapy gym at the end of the session.  OT providing mod cues for big steps with ambulation and min tactile cues at back during sit > stand due to increased posterior and right lean this session. Large amplitude: engaged in modified LSVT big exercises to carry over to decreased burden of care with transitional movements and dressing. Pt benefiting from targets and tactile cues for forward and downward reach as well as verbal cues and targets for improved upright posture when reaching up and out to side.  Pt demonstrating decreased speed with movements and requiring mod verbal cues throughout.  Pt also demonstrating right lean in sitting benefiting from targets to facilitate increased upright posture/positioning. Don/doff jacket:  pt requiring significantly increased time with initiation and sequencing with donning jacket this session.  Pt benefiting from demonstration and cues, however still requiring mod-max assist for donning jacket this session.  Pt with posterior lean resulting in LOB backwards requiring assistance to lower to sit when attempting to stand to adjust jacket down back.     06/23/24 Self-care: engaged in therapeutic discussion about frequency of falls and use of U-step with mobility.  Pt and spouse  reporting falls seem to happen when he is taking a few steps to or from the U-step.  OT drew a mockup of pt's bathroom with his guidance to problem solve setup of U-step.  Engaged in massed practice with ambulating to counter in treatment gym to simulate home bathroom setup and positioning of U-step during transitional movements to/from counter.  OT educating on increased proximity of U-step to counter top and pt to decrease need for steps away from U-step.  Pt verbalizing some concerns about placement, but demonstrating improvements in ease of transitional movements. Pt to attempt improved placement at home. Sit > stand: engaged in sit > stand from progressively lower surface mat table, progressing from 21.5 height to 20 height, lowering 0.5 at a time.  Pt demonstrating improvements with anterior weight shift and intermittent use of UE to adjust to fully upright posture when standing.     PATIENT EDUCATION: Education details: techniques for land Person educated: Patient and Spouse Education method: Medical Illustrator Education comprehension: verbalized understanding and needs further education  HOME EXERCISE PROGRAM: TBD  GOALS: Goals reviewed with patient? Yes  SHORT TERM GOALS: Target date: 07/02/24  Pt and spouse will be independent in full body HEP with focus on large amplitude movements as needed to maintain/preserve function as pertaining to ADLs/IADLs.  Baseline:  Goal status: in progress  2.  Pt will demonstrate ability to complete sequencing of approach to transfer and sit > stand as needed to get up from toilet or other seating options in home by completing x3 throughout session without cues or physical assistance to maintain/preserve function.  Baseline:  Goal status: in progress  3.  Pt and spouse will report understanding of DME vs adaptive techniques/strategies and/or modifications to routines to increase safety with mobility and self-care tasks  in the home.  Baseline:  Goal status: in progress    LONG TERM GOALS: Target date: 07/30/24  Pt will demonstrate improved ease and independence with sit > stand as evidenced by completing 5x sit > stand without posterior LOB and improved time by 5 seconds. Baseline: TBD, heavy posterior lean with sit > stand on eval Goal status: in progress  2.  Pt will demonstrate and/or report ability to complete UB dressing (both button up shirt and pull over sweatshirt) with min assist or better with use of AE and/or alternative strategies PRN.  Baseline:  pt reporting 5-7 on PSFS with UB dressing Goal status: in progress  3.  Pt will demonstrate and/or report ability to complete LB dressing (donning pants) with min assist or better with use of AE and/or alternative strategies PRN.  Baseline: pt reporting 5 on PSFS Goal status: in progress  4.  Pt will demonstrate and/or report ability to complete LB dressing (donning shoes) with mod assist or better with use of AE and/or alternative strategies PRN.  Baseline: max-total assist for tie shoes and min assist to supervision for slip on - but prefers tie shoes for safety with walking Goal status: in progress  5.  Patient will report at least two-point increase in average PSFS score or at least three-point increase in a single activity score indicating functionally significant improvement given minimum detectable change. Baseline: 4.6 Goal status: in progress   ASSESSMENT:  CLINICAL IMPRESSION: Patient is a 88 y.o. male who was seen today for occupational therapy treatment for advancements in Parkinson's disease, increased falls, and decreased independence with ADLs/IADLs.  Pt benefits greatly from use of U-step with ambulation, demonstrating improvements in gait speed and step length this session both at beginning and end of session.   Pt demonstrating improvements in upright body positioning and posture with verbal and demonstration cues, allowing for  increased ROM and reach during structured tasks.  Pt will continue to benefit from skilled occupational therapy services to address strength and coordination, ROM, pain management, balance, GM/FM control, cognition, safety awareness, introduction of compensatory strategies/AE prn, and implementation of an HEP to improve participation and safety during ADLs and IADLs and quality of life. SABRA   PERFORMANCE DEFICITS: in functional skills including ADLs, IADLs, ROM, strength, flexibility, mobility, balance, body mechanics, decreased knowledge of precautions, decreased knowledge of use of DME, and UE functional use and psychosocial skills including coping strategies, environmental adaptation, and routines and behaviors.   IMPAIRMENTS: are limiting patient from ADLs and IADLs.     PLAN:  OT FREQUENCY: 2x/week for 3 weeks, then 1x/week for remaining 3 weeks  OT DURATION: 6 weeks (asking 8 weeks to accommodate scheduling)  PLANNED INTERVENTIONS: 02831 OT Re-evaluation, 97535 self care/ADL training, 02889 therapeutic exercise, 97530 therapeutic activity, 97112 neuromuscular re-education, 97140 manual therapy, 97035 ultrasound, 97032 electrical stimulation (manual), functional mobility training, psychosocial skills training, energy conservation, coping strategies training, patient/family education, and DME and/or AE instructions  RECOMMENDED OTHER SERVICES: NA  CONSULTED AND AGREED WITH PLAN OF CARE: Patient and family member/caregiver  PLAN FOR NEXT SESSION: massed practice with don/doff jacket; educate on modified technique for button up and pull over shirts, problem solve height of chair to increase ease with LB dressing and donning shoes, transfers with 90* turns and big steps, large amplitude exercises  Check STGs at next session.   KAYLENE DOMINO, OTR/L 07/01/2024, 12:10 PM  [facility] [address] [place], [address] [place], [place] [address] Phone # [phone] Fax # [phone]

## 2024-07-02 DIAGNOSIS — Z23 Encounter for immunization: Secondary | ICD-10-CM | POA: Diagnosis not present

## 2024-07-06 ENCOUNTER — Ambulatory Visit

## 2024-07-06 ENCOUNTER — Ambulatory Visit: Admitting: Occupational Therapy

## 2024-07-06 DIAGNOSIS — M6281 Muscle weakness (generalized): Secondary | ICD-10-CM

## 2024-07-06 DIAGNOSIS — R293 Abnormal posture: Secondary | ICD-10-CM

## 2024-07-06 DIAGNOSIS — G20A2 Parkinson's disease without dyskinesia, with fluctuations: Secondary | ICD-10-CM

## 2024-07-06 DIAGNOSIS — R2681 Unsteadiness on feet: Secondary | ICD-10-CM | POA: Diagnosis not present

## 2024-07-06 DIAGNOSIS — R29818 Other symptoms and signs involving the nervous system: Secondary | ICD-10-CM

## 2024-07-06 DIAGNOSIS — R2689 Other abnormalities of gait and mobility: Secondary | ICD-10-CM | POA: Diagnosis not present

## 2024-07-06 DIAGNOSIS — R262 Difficulty in walking, not elsewhere classified: Secondary | ICD-10-CM

## 2024-07-06 NOTE — Therapy (Signed)
 OUTPATIENT OCCUPATIONAL THERAPY PARKINSON'S  Treatment Note  Patient Name: [name] MRN: [id number] DOB:1936/06/28, 88 y.o., male Today's Date: 07/06/2024  PCP: Yolande Toribio MATSU, MD REFERRING PROVIDER: Cary No, NP  END OF SESSION:  OT End of Session - 07/06/24 1106     Visit Number 6    Number of Visits 10    Date for Recertification  07/30/24    Authorization Type Medicare A &B    OT Start Time 1103    OT Stop Time 1145    OT Time Calculation (min) 42 min               Past Medical History:  Diagnosis Date   Ankylosing spondylitis (HCC) dx'd ~ 1974   Arthritis    back (08/10/2015)   BENIGN PROSTATIC HYPERTROPHY, WITH OBSTRUCTION 01/15/2010   Bleeding duodenal ulcer    Bleeding esophageal ulcer    Bleeding stomach ulcer    GERD 02/22/2008   History of blood transfusion 08/26/1986   lost ~ 1/2 of my blood volume from multiple bleeding ulcers   History of hiatal hernia    HYPERGLYCEMIA 11/18/2007   HYPERLIPIDEMIA 02/22/2008   HYPERTENSION, UNSPECIFIED 11/10/2009   Kidney stones    Osteoarthritis    c-spine   Parkinson's disease (HCC)    PEPTIC ULCER DISEASE 11/17/2007   Situational depression    son died in MVA 07-06-2015   TIA (transient ischemic attack)    not that I know of in the past; they are trying to determine if I've had one today (08/10/2015)   Past Surgical History:  Procedure Laterality Date   CATARACT EXTRACTION W/ INTRAOCULAR LENS  IMPLANT, BILATERAL Bilateral 2013   Patient Active Problem List   Diagnosis Date Noted   Ankylosing spondylitis of cervicothoracic region (HCC) 10/16/2016   TIA (transient ischemic attack) 08/10/2015   Carotid stenosis 08/04/2014   Hyperlipidemia 02/21/2012   BENIGN PROSTATIC HYPERTROPHY, WITH OBSTRUCTION 01/15/2010   Essential hypertension 11/10/2009   GERD 02/22/2008   NEPHROLITHIASIS, HX OF 11/17/2007    ONSET DATE: referral date 05/07/24  REFERRING DIAG: G20.A2 (ICD-10-CM) - Parkinson's  disease with fluctuating manifestations, unspecified whether dyskinesia present  THERAPY DIAG:  Other symptoms and signs involving the nervous system  Muscle weakness (generalized)  Abnormal posture  Rationale for Evaluation and Treatment: Rehabilitation  SUBJECTIVE:   SUBJECTIVE STATEMENT: Pt reports that he has been walking up and down the hall as it has been too cold to walk at the gym.    Pt accompanied by: self and significant other (spouse, [name])  PERTINENT HISTORY: history of Parkinson's disease, duodenal and esophageal ulcers, ankylosing spondylitis, hypertension and hyperlipidemia   PRECAUTIONS: Fall  WEIGHT BEARING RESTRICTIONS: No  PAIN:  Are you having pain? No  FALLS: Has patient fallen in last 6 months? Yes. Number of falls : falling more than once a week, most often in the bathroom or when attempting to walk without walker  LIVING ENVIRONMENT: Lives with: lives with their spouse Lives in: House/apartment Stairs: Yes: External: 2-3 steps; has a ramp at entrance with raised railings Internal: steps to basement and steps to loft - however pt does not need to go up/down those at this time. Has following equipment at home: [name] - 2 wheeled, Environmental Consultant - 4 wheeled, U-step, bed side commode, Shower chair, Ramped entry, and transport chair; 21 commode and arm rests that are attached to toilet seat, doorways have been widened  PLOF: Requires assistive device for independence and Needs assistance with  ADLs; [place]  PATIENT GOALS: safety with bathroom transfers and strategies for dressing tasks  OBJECTIVE:  Note: Objective measures were completed at Evaluation unless otherwise noted.  HAND DOMINANCE: Right  ADLs: Overall ADLs: fluctuates drastically; can take up to 2 hours for bathing and dressing Transfers/ambulation related to ADLs: Utilizing U-step for mobility in and outside of the home  Eating: he's doing well with the  eating manageable UB Dressing: occasional assistance, button up shirt is easier than pullover LB Dressing: getting dressed on a closed commode seat Toileting: Pt will occasionally require assistance to get up from toilet, has had falls attempting to transfer both to and from toilet Bathing: sponge bathing at sink without assistance from shower chair seated at sink, spouse assisting with washing hair; Setup for washing feet  Tub Shower transfers: not currently performing Equipment: Shower seat with back and Walk in shower with 5.5 ledge with 30 door  IADLs: Light housekeeping: washes dishes when he is feeling strong, however frequent onset of weakness Meal Prep: Pt was primary cook prior to fall in 2023, but has returned to participation in aspects of meal prep Community mobility: recently renewed drivers license, but is not driving  MOBILITY STATUS: Needs Assist: CGA with mobility with RW vs U-step and Hx of falls   POSTURE COMMENTS:  rounded shoulders and forward head, posterior pelvic tilt and R lean    FUNCTIONAL OUTCOME MEASURES:    UE ROM:  all movements are slowed, decreased shoulder flexion d/t ankylosing spondylitis   UE MMT:   grossly 3+ to 4/5 overall  COGNITION: Overall cognitive status: spouse reports thinking continues to be slower  OBSERVATIONS: Bradykinesia                                                                                                                    TREATMENT DATE:  07/06/24 Pt reporting that it feels like his shoes are magnetized, bringing heels closer together in sitting and with ambulation.  OT encouraging pt and spouse to utilize targets on the floor or even visual lines with tiles on floor to encourage separation of feet.  Spouse also to check a particular pair of shoes as some shoes with wider soles. LB dressing: OT elevated mat table to 21 to simulate home setup, as pt dressing from 21 height toilet for improved seat height to aid  in standing afterwards.  OT placed dots on floor to provide visual cue to keep feet in alignment prior to and post attempts at simulated LB dressing.  Utilized trash bag to simulate pant, completing massed practice with lifting one leg to advance bag under foot and then back out.  Completing with RLE and then LLE.  OT providing initial hand over hand to facilitate sequencing.  Pt demonstrating improvements when doffing bag compared to donning with decreased sequencing and frequently pulling up on bag at foot.  OT providing cues to return foot to colored dot to maintain foot alignment at shoulder width.  OT educating on donning LLE first due to decreased ease with lifting LLE to allow for increased ease with LB dressing.  OT providing with handout from OT toolkit, recommending attempt to bring leg up to edge of couch or bedside to allow for increased ease with reach towards foot.  However bed/couch may be too low of a seated height. Pt and spouse to attempt variations at home.    07/01/24 Large amplitude: forward weight shifting with hands on large therapy ball.  OT providing demonstration and cues, requiring increased cues for technique as pt with tendency to push down vs forward with ball.  Transitioned to reaching outside BOS and across midline in sitting with focus on trunk rotation and weight shifting while reaching towards vertical poles.  OT providing demonstration, close supervision, and cues for sequencing and amplitude.  Pt benefiting from cues throughout for amplitude and postural control to increase weight shifting and reach.  Pt benefits from targets for increased reach. Card sorting: engaged in flipping cards with RUE and then LUE with focus on large amplitude reach, external rotation.  OT providing cues to attend to stack of cards to increase attention and awareness as pt demonstrating decreasing ROM as he fatigued. OT educating on use of plate at home to allow for increased awareness if  missing the target.   06/29/24 Transitional movements: Attempted to engage in transfers and sit > stand intermittently throughout session and at beginning/end of session with fewer cues.  Pt demonstrating very slow and shuffled gait when transitioning from PT to OT session and when exiting therapy gym at the end of the session.  OT providing mod cues for big steps with ambulation and min tactile cues at back during sit > stand due to increased posterior and right lean this session. Large amplitude: engaged in modified LSVT big exercises to carry over to decreased burden of care with transitional movements and dressing. Pt benefiting from targets and tactile cues for forward and downward reach as well as verbal cues and targets for improved upright posture when reaching up and out to side.  Pt demonstrating decreased speed with movements and requiring mod verbal cues throughout.  Pt also demonstrating right lean in sitting benefiting from targets to facilitate increased upright posture/positioning. Don/doff jacket:  pt requiring significantly increased time with initiation and sequencing with donning jacket this session.  Pt benefiting from demonstration and cues, however still requiring mod-max assist for donning jacket this session.  Pt with posterior lean resulting in LOB backwards requiring assistance to lower to sit when attempting to stand to adjust jacket down back.       PATIENT EDUCATION: Education details: techniques for LB dressing Person educated: Patient and Spouse Education method: Medical Illustrator Education comprehension: verbalized understanding and needs further education  HOME EXERCISE PROGRAM: TBD  GOALS: Goals reviewed with patient? Yes  SHORT TERM GOALS: Target date: 07/02/24  Pt and spouse will be independent in full body HEP with focus on large amplitude movements as needed to maintain/preserve function as pertaining to ADLs/IADLs.  Baseline:  Goal  status: in progress  2.  Pt will demonstrate ability to complete sequencing of approach to transfer and sit > stand as needed to get up from toilet or other seating options in home by completing x3 throughout session without cues or physical assistance to maintain/preserve function.  Baseline:  Goal status: in progress  3.  Pt and spouse will report understanding of DME vs adaptive techniques/strategies and/or modifications to routines to  increase safety with mobility and self-care tasks in the home.  Baseline:  Goal status: in progress    LONG TERM GOALS: Target date: 07/30/24  Pt will demonstrate improved ease and independence with sit > stand as evidenced by completing 5x sit > stand without posterior LOB and improved time by 5 seconds. Baseline: TBD, heavy posterior lean with sit > stand on eval Goal status: in progress  2.  Pt will demonstrate and/or report ability to complete UB dressing (both button up shirt and pull over sweatshirt) with min assist or better with use of AE and/or alternative strategies PRN.  Baseline: pt reporting 5-7 on PSFS with UB dressing Goal status: in progress  3.  Pt will demonstrate and/or report ability to complete LB dressing (donning pants) with min assist or better with use of AE and/or alternative strategies PRN.  Baseline: pt reporting 5 on PSFS Goal status: in progress  4.  Pt will demonstrate and/or report ability to complete LB dressing (donning shoes) with mod assist or better with use of AE and/or alternative strategies PRN.  Baseline: max-total assist for tie shoes and min assist to supervision for slip on - but prefers tie shoes for safety with walking Goal status: in progress  5.  Patient will report at least two-point increase in average PSFS score or at least three-point increase in a single activity score indicating functionally significant improvement given minimum detectable change. Baseline: 4.6 Goal status: in  progress   ASSESSMENT:  CLINICAL IMPRESSION: Patient is a 88 y.o. male who was seen today for occupational therapy treatment for advancements in Parkinson's disease, increased falls, and decreased independence with ADLs/IADLs.  Pt benefits greatly from use of U-step with ambulation, demonstrating improvements in gait speed and step length this session with good sequencing during turns and transfer to sit on edge of mat.  Pt benefiting from massed practice with break down of LB dressing with use of targets to keep feet apart for increased ease with simulated LB dressing.  Pt continues to benefit from hand over hand and targets for increased sequencing.  Plan to further address modifications for increased ease with self-care tasks.  PERFORMANCE DEFICITS: in functional skills including ADLs, IADLs, ROM, strength, flexibility, mobility, balance, body mechanics, decreased knowledge of precautions, decreased knowledge of use of DME, and UE functional use and psychosocial skills including coping strategies, environmental adaptation, and routines and behaviors.   IMPAIRMENTS: are limiting patient from ADLs and IADLs.     PLAN:  OT FREQUENCY: 2x/week for 3 weeks, then 1x/week for remaining 3 weeks  OT DURATION: 6 weeks (asking 8 weeks to accommodate scheduling)  PLANNED INTERVENTIONS: 02831 OT Re-evaluation, 97535 self care/ADL training, 02889 therapeutic exercise, 97530 therapeutic activity, 97112 neuromuscular re-education, 97140 manual therapy, 97035 ultrasound, 97032 electrical stimulation (manual), functional mobility training, psychosocial skills training, energy conservation, coping strategies training, patient/family education, and DME and/or AE instructions  RECOMMENDED OTHER SERVICES: NA  CONSULTED AND AGREED WITH PLAN OF CARE: Patient and family member/caregiver  PLAN FOR NEXT SESSION: massed practice with don/doff jacket; educate on modified technique for button up and pull over shirts,  problem solve height of chair to increase ease with LB dressing and donning shoes, trial LB dressing with leg in modified circle sitting at edge of mat, transfers with 90* turns and big steps, large amplitude exercises    Pamela Intrieri, OTR/L 07/06/2024, 11:07 AM  [facility] [address], [address] [place], [place] [address] Phone # [phone] Fax # [phone]

## 2024-07-06 NOTE — Therapy (Signed)
 OUTPATIENT PHYSICAL THERAPY NEURO TREATMENT  Patient Name: [name] MRN: [id number] DOB:[birth year], 88 y.o., male Today's Date: 07/06/2024   PCP: Yolande Toribio MATSU, MD REFERRING PROVIDER: Buck Saucer, MD      END OF SESSION:  PT End of Session - 07/06/24 1205     Visit Number 20    Number of Visits 23    Date for Recertification  07/26/24    Authorization Type Medicare/[employer]    Progress Note Due on Visit 27    PT Start Time 1145    PT Stop Time 1230    PT Time Calculation (min) 45 min    Equipment Utilized During Treatment Gait belt    Activity Tolerance Patient tolerated treatment well    Behavior During Therapy WFL for tasks assessed/performed              Past Medical History:  Diagnosis Date   Ankylosing spondylitis (HCC) dx'd ~ 1974   Arthritis    back (08/10/2015)   BENIGN PROSTATIC HYPERTROPHY, WITH OBSTRUCTION 01/15/2010   Bleeding duodenal ulcer    Bleeding esophageal ulcer    Bleeding stomach ulcer    GERD 02/22/2008   History of blood transfusion 08/26/1986   lost ~ 1/2 of my blood volume from multiple bleeding ulcers   History of hiatal hernia    HYPERGLYCEMIA 11/18/2007   HYPERLIPIDEMIA 02/22/2008   HYPERTENSION, UNSPECIFIED 11/10/2009   Kidney stones    Osteoarthritis    c-spine   Parkinson's disease (HCC)    PEPTIC ULCER DISEASE 11/17/2007   Situational depression    son died in MVA 04-Jul-2015   TIA (transient ischemic attack)    not that I know of in the past; they are trying to determine if I've had one today (08/10/2015)   Past Surgical History:  Procedure Laterality Date   CATARACT EXTRACTION W/ INTRAOCULAR LENS  IMPLANT, BILATERAL Bilateral 2013   Patient Active Problem List   Diagnosis Date Noted   Ankylosing spondylitis of cervicothoracic region (HCC) 10/16/2016   TIA (transient ischemic attack) 08/10/2015   Carotid stenosis 08/04/2014   Hyperlipidemia 02/21/2012   BENIGN PROSTATIC HYPERTROPHY, WITH OBSTRUCTION  01/15/2010   Essential hypertension 11/10/2009   GERD 02/22/2008   NEPHROLITHIASIS, HX OF 11/17/2007    ONSET DATE: PD x years onset  REFERRING DIAG: R26.81 (ICD-10-CM) - Unsteadiness on feet R29.3 (ICD-10-CM) - Abnormal posture R27.8 (ICD-10-CM) - Other lack of coordination G20.A2 (ICD-10-CM) - Parkinson's disease with fluctuating manifestations, unspecified whether dyskinesia present (HCC)  THERAPY DIAG:  Other symptoms and signs involving the nervous system  Muscle weakness (generalized)  Abnormal posture  Unsteadiness on feet  Other abnormalities of gait and mobility  Parkinson's disease with fluctuating manifestations, unspecified whether dyskinesia present (HCC)  Difficulty in walking, not elsewhere classified  Rationale for Evaluation and Treatment: Rehabilitation  SUBJECTIVE:  SUBJECTIVE STATEMENT: Have not had any falls in past week.  More frequently using U-step  Pt accompanied by: significant other  PERTINENT HISTORY: complex medical history of peptic ulcer disease with history of upper GI bleed, TIA, depression, degenerative disc disease in the neck, osteoarthritis, kidney stones, hyperlipidemia, hypertension, history of hiatal hernia, BPH, arthritis, and ankylosing spondylitis, and parkinsonism  PAIN:  Are you having pain? Not really  PRECAUTIONS: Fall  RED FLAGS: None   WEIGHT BEARING RESTRICTIONS: No  FALLS: Has patient fallen in last 3 months? Unsure as to number, notes fall/LOB every other week  LIVING ENVIRONMENT: Lives with: lives with their spouse Lives in: House/apartment Stairs: Ramp to enter, stairs to loft and basement, ground floor set-up Has following equipment at home: [name] - 2 wheeled  PLOF: Needs assistance with ADLs, Needs assistance with  homemaking, and Needs assistance with gait  PATIENT GOALS:   OBJECTIVE:   TODAY'S TREATMENT: 07/06/24 Activity Comments  Circuit training x 10 min, completed 3 rounds -LAQ x 10 reps, 5# -alt stair taps x 10 reps, 5# -seated row x 10,15,20#  Transfer training -tactile, visual, verbal cues to improve trunk flexion over BOS for sit to stand and trials to generalize to use of U-step  Standing balance/postural control Standing postural perturbations multidirectional 3x2 min rounds              TODAY'S TREATMENT: 06/29/24 Activity Comments  NU-step level 5 x 8 min  70-80 SPM  Slalom gait around obstacles Tehcniques for step initiation   Sit to stand transfers W/ red resistance for encouraging trunk flexion over BOS  High step forward march/retrowalk 2x2 min Parallel bars and CGA  Sidestep w/ BUE support 2x2 min Trials with tactile cues for sequencing/position. Trial w/ visual cues for sequence/foot placement  Standing hip flexion 1x20 reps 3#, CGA-min A for control, visual cue for height     TODAY'S TREATMENT: 06/09/24 Activity Comments  NU-step level 3 x 6 min Maintaining 70+ SPM  strength Standing unilat row 3x10, 15# Seated row 3x10, 25# Seated lat row 3x10, 35#  Transfer training -trials w/ trunk flexion over BOS for sit to stand -auditory and visual cues for sequencing turns for chair to chair transfers                PATIENT EDUCATION: Education details: discussed using U step when trying to carry items rather than going without AD Person educated: Patient and Spouse Education method: Explanation Education comprehension: verbalized understanding       Note: Objective measures were completed at Evaluation unless otherwise noted.  DIAGNOSTIC FINDINGS:   COGNITION: Overall cognitive status: Within functional limits for tasks assessed and some instances of difficulty attending to task/sequencing   SENSATION: Not tested  COORDINATION: Deficits to rapid  alternating movements    MUSCLE TONE: rigidity  MUSCLE LENGTH: 10-15 degree knee flexion contracture bilat   POSTURE: rounded shoulders, forward head, and increased thoracic kyphosis  LOWER EXTREMITY ROM:     Right ankle ROM limits from fusion  LOWER EXTREMITY MMT:    4/5 gross BLE  BED MOBILITY:  Findings: NT, reports fluctuating performance  TRANSFERS: Sit to stand: Modified independence and CGA  Assistive device utilized: U-Step     Stand to sit: CGA  Assistive device utilized: u-step     Chair to chair: Modified independence and CGA  Assistive device utilized: U-step         CURB:  Findings: CGA-min A  STAIRS: Findings: Comments: CGA-min A  GAIT: Findings: Distance walked: 150, Assistive device utilized:U-step, Level of assistance: CGA, and Comments: freezing in turns  FUNCTIONAL TESTS:  5 times sit to stand: 47.25 sec Timed up and go (TUG): 36 sec w/ U-step 10 meter walk test: 23.34 sec = 1.4 ft/sec w/ U-step Lars Balance Scale:  180 degree turns: 16-18 steps  Item Test date: 01/20/24 Test date: 06/01/23 Test date:   Sitting to standing 2. able to stand using hands after several tries 2. able to stand using hands after several tries  3. able to stand 2 minutes with supervision  4. able to sit safely and securely for 2 minutes   2. uses back of legs against chair to control descent  2. able to transfer with verbal cuing and/or supervision  3. able to stand 10 seconds with supervision  3. able to place feet together independently and stand 1 minute with supervision  3. can reach forward 12 cm (5 inches)    3. able to pick up slipper but needs supervision  1. needs supervision when turning    1. needs close supervision or verbal cuing  1. able to complete > 2 steps needs minimal assist    0. loses balance while stepping or standing   0. unable to try of needs assist to prevent fall       Insert OPRCBERGREEVAL SmartPhrase at re-test date  2.  Standing unsupported 3. able to stand 2 minutes with supervision    3. Sitting with back unsupported, feet supported 4. able to sit safely and securely for 2 minutes    4. Standing to sitting 2. uses back of legs against chair to control descent    5. Pivot transfer  2. able to transfer with verbal cuing and/or supervision    6. Standing unsupported with eyes closed 3. able to stand 10 seconds with supervision    7. Standing unsupported with feet together 3. able to place feet together independently and stand 1 minute with supervision    8. Reaching forward with outstretched arms while standing 3. can reach forward 12 cm (5 inches)    9. Pick up object from the floor from standing 0. unable to try/needs assistance to keep from losing balance or falling    10. Turning to look behind over left and right shoulders while standing 2. turns sideways but only maintains balance    11. Turn 360 degrees 0. needs assistance while turning    12. Place alternate foot on step or stool while standing unsupported 1. able to complete > 2 steps needs minimal assist    13. Standing unsupported one foot in front 0. loses balance while stepping or standing    14. Standing on one leg 0. unable to try of needs assist to prevent fall      Total Score 25/56 Total Score  28 /56 Total Score /56     PATIENT SURVEYS:  Freezing of gait questionnaire provided to fill out and return  TREATMENT DATE:     PATIENT EDUCATION:a Education details: assessment details, rationale of tx interventions Person educated: Patient and Spouse Education method: Explanation Education comprehension: verbalized understanding  HOME EXERCISE PROGRAM: TBD  GOALS: Goals reviewed with patient? Yes  SHORT TERM GOALS: Target date: 06/21/2024      Patient will perform HEP with family/caregiver supervision for  improved strength, balance, transfers, and gait  Baseline: Goal status: MET  2.  Demo improved postural control and BLE strength per time 35 sec 5xSTS test Baseline: 47 sec; 37 sec; 38 sec Goal status: IN PROGRESS 05/31/24    LONG TERM GOALS: Target date: 07/26/2024      Maintain score 28-30/56 Berg Balance Test for safety during ADL Baseline: 25/56; 27/56; 29/56; 28/56 Goal status: IN PROGRESS 05/31/24  2.  Demo improved safety/efficiency of turning negotiating 180 degree turn in 10-12 steps Baseline: 16-18 w/ U-step; 20-22 w/ U-step; 12-18 steps given min A for RW negotiation 04/13/24; 20+ steps and min A to prevent posterior LOB 05/04/24 Goal status: IN PROGRESS 05/04/24  3.  Demo improved safety with mobility per time 25 sec TUG test Baseline: 36 sec w/ U-step; 1 min 30 sec w/ U-step; 1 min 10 sec with RW and verbal cueing for safe turning/navigation; 47 sec w/ U-step Goal status: IN PROGRESS 05/31/24  4.  Spouse/patient to teach-back strategies to help with reducing freezing of gait to improve safety with turning to sit w/ U-step Baseline:  Goal status: IN PROGRESS   ASSESSMENT:  CLINICAL IMPRESSION: Heavy work to initiate session with circuit training for increased activity tolerance requiring visual cues for isolation movements and tactile cues to facilitate compound movements.  Transfer training techniques w/ pt and spouse for strategies to promote trunk flexion over BOS and sequencing w/ AD initially min A progressed to SBA using strategy of pushing from chair and other extremity to walker, use of visual contrast on walker handle helped promote carryover over. Frequent physical assistance needed throughout session to overcome retropulsion and posterior LOB.    OBJECTIVE IMPAIRMENTS: Abnormal gait, decreased activity tolerance, decreased balance, decreased coordination, decreased mobility, difficulty walking, decreased strength, improper body mechanics, and postural dysfunction.     PLAN:  PT FREQUENCY: 1x/week  PT DURATION: 6 weeks  PLANNED INTERVENTIONS: 97750- Physical Performance Testing, 97110-Therapeutic exercises, 97530- Therapeutic activity, [id number]- Neuromuscular re-education, 97535- Self Care, [id number]- Manual therapy, [id number]- Gait training, and [phone]- Aquatic Therapy  PLAN FOR NEXT SESSION: metronome with U-step for sequencing steps for turning to perform stand to sit transfer  12:05 PM, 07/06/24 M. Kelly Chalise Pe, PT, DPT Physical Therapist- [place] Office Number: [phone]

## 2024-07-08 ENCOUNTER — Ambulatory Visit: Admitting: Occupational Therapy

## 2024-07-08 DIAGNOSIS — R2681 Unsteadiness on feet: Secondary | ICD-10-CM | POA: Diagnosis not present

## 2024-07-08 DIAGNOSIS — R29818 Other symptoms and signs involving the nervous system: Secondary | ICD-10-CM

## 2024-07-08 DIAGNOSIS — R293 Abnormal posture: Secondary | ICD-10-CM

## 2024-07-08 DIAGNOSIS — R2689 Other abnormalities of gait and mobility: Secondary | ICD-10-CM | POA: Diagnosis not present

## 2024-07-08 DIAGNOSIS — M459 Ankylosing spondylitis of unspecified sites in spine: Secondary | ICD-10-CM | POA: Diagnosis not present

## 2024-07-08 DIAGNOSIS — G20A2 Parkinson's disease without dyskinesia, with fluctuations: Secondary | ICD-10-CM | POA: Diagnosis not present

## 2024-07-08 DIAGNOSIS — M6281 Muscle weakness (generalized): Secondary | ICD-10-CM

## 2024-07-08 NOTE — Therapy (Signed)
 OUTPATIENT OCCUPATIONAL THERAPY PARKINSON'S  Treatment Note  Patient Name: [name] MRN: [id number] DOB:[birth year], 88 y.o., male Today's Date: 07/08/2024  PCP: Yolande Toribio MATSU, MD REFERRING PROVIDER: Cary No, NP  END OF SESSION:  OT End of Session - 07/08/24 1112     Visit Number 7    Number of Visits 10    Date for Recertification  07/30/24    Authorization Type Medicare A &B    OT Start Time 1104    OT Stop Time 1144    OT Time Calculation (min) 40 min                Past Medical History:  Diagnosis Date   Ankylosing spondylitis (HCC) dx'd ~ 1974   Arthritis    back (08/10/2015)   BENIGN PROSTATIC HYPERTROPHY, WITH OBSTRUCTION 01/15/2010   Bleeding duodenal ulcer    Bleeding esophageal ulcer    Bleeding stomach ulcer    GERD 02/22/2008   History of blood transfusion 08/26/1986   lost ~ 1/2 of my blood volume from multiple bleeding ulcers   History of hiatal hernia    HYPERGLYCEMIA 11/18/2007   HYPERLIPIDEMIA 02/22/2008   HYPERTENSION, UNSPECIFIED 11/10/2009   Kidney stones    Osteoarthritis    c-spine   Parkinson's disease (HCC)    PEPTIC ULCER DISEASE 11/17/2007   Situational depression    son died in MVA 06-30-2015   TIA (transient ischemic attack)    not that I know of in the past; they are trying to determine if I've had one today (08/10/2015)   Past Surgical History:  Procedure Laterality Date   CATARACT EXTRACTION W/ INTRAOCULAR LENS  IMPLANT, BILATERAL Bilateral 2013   Patient Active Problem List   Diagnosis Date Noted   Ankylosing spondylitis of cervicothoracic region (HCC) 10/16/2016   TIA (transient ischemic attack) 08/10/2015   Carotid stenosis 08/04/2014   Hyperlipidemia 02/21/2012   BENIGN PROSTATIC HYPERTROPHY, WITH OBSTRUCTION 01/15/2010   Essential hypertension 11/10/2009   GERD 02/22/2008   NEPHROLITHIASIS, HX OF 11/17/2007    ONSET DATE: referral date 05/07/24  REFERRING DIAG: G20.A2 (ICD-10-CM) -  Parkinson's disease with fluctuating manifestations, unspecified whether dyskinesia present  THERAPY DIAG:  Other symptoms and signs involving the nervous system  Muscle weakness (generalized)  Abnormal posture  Rationale for Evaluation and Treatment: Rehabilitation  SUBJECTIVE:   SUBJECTIVE STATEMENT: Pt reports things went topsy turvy.  Pt reports that he had a fall in the bathroom as he was exiting and then changed his mind to turn to get some tissues.    Pt accompanied by: self and significant other (spouse, [name])  PERTINENT HISTORY: history of Parkinson's disease, duodenal and esophageal ulcers, ankylosing spondylitis, hypertension and hyperlipidemia   PRECAUTIONS: Fall  WEIGHT BEARING RESTRICTIONS: No  PAIN:  Are you having pain? No  FALLS: Has patient fallen in last 6 months? Yes. Number of falls : falling more than once a week, most often in the bathroom or when attempting to walk without walker  LIVING ENVIRONMENT: Lives with: lives with their spouse Lives in: House/apartment Stairs: Yes: External: 2-3 steps; has a ramp at entrance with raised railings Internal: steps to basement and steps to loft - however pt does not need to go up/down those at this time. Has following equipment at home: [name] - 2 wheeled, Walker - 4 wheeled, U-step, bed side commode, Shower chair, Ramped entry, and transport chair; 21 commode and arm rests that are attached to toilet seat, doorways have been widened  PLOF: Requires assistive device for independence and Needs assistance with ADLs; [place]  PATIENT GOALS: safety with bathroom transfers and strategies for dressing tasks  OBJECTIVE:  Note: Objective measures were completed at Evaluation unless otherwise noted.  HAND DOMINANCE: Right  ADLs: Overall ADLs: fluctuates drastically; can take up to 2 hours for bathing and dressing Transfers/ambulation related to ADLs: Utilizing U-step for  mobility in and outside of the home  Eating: he's doing well with the eating manageable UB Dressing: occasional assistance, button up shirt is easier than pullover LB Dressing: getting dressed on a closed commode seat Toileting: Pt will occasionally require assistance to get up from toilet, has had falls attempting to transfer both to and from toilet Bathing: sponge bathing at sink without assistance from shower chair seated at sink, spouse assisting with washing hair; Setup for washing feet  Tub Shower transfers: not currently performing Equipment: Shower seat with back and Walk in shower with 5.5 ledge with 30 door  IADLs: Light housekeeping: washes dishes when he is feeling strong, however frequent onset of weakness Meal Prep: Pt was primary cook prior to fall in 2023, but has returned to participation in aspects of meal prep Community mobility: recently renewed drivers license, but is not driving  MOBILITY STATUS: Needs Assist: CGA with mobility with RW vs U-step and Hx of falls   POSTURE COMMENTS:  rounded shoulders and forward head, posterior pelvic tilt and R lean    FUNCTIONAL OUTCOME MEASURES:    UE ROM:  all movements are slowed, decreased shoulder flexion d/t ankylosing spondylitis   UE MMT:   grossly 3+ to 4/5 overall  COGNITION: Overall cognitive status: spouse reports thinking continues to be slower  OBSERVATIONS: Bradykinesia                                                                                                                    TREATMENT DATE:  07/08/24 Engaged in seated exercises at edge of mat with focus on breaking down LB dressing into hip flexion and external rotation. Engaged in massed practice of hip flexion with seated marching, followed by attempts at hip abduction to bring leg to edge of mat.  Pt able to achieve on R but not on L side.  Transitioned to attempting to flex left hip to raise leg while reaching towards shin with R hand to  address trunk rotation.  Pt with decreased ability to complete, therefore transitioned to isolated trunk rotation while holding ball in B hands.  OT providing cues for pt to turn head to look at target to facilitate increase UB rotation.  Utilized scarf to simulate LB dressing with RLE in partial hip external rotation.  Pt still requiring significant amount of time and encouragement for each movement and to carry over to functional tasks. Self-care: engaged in discussion about [employer] Slip-ins as an option to aid in donning shoes and eliminating need to tie shoes.  Spouse to look further into this option.  07/06/24 Pt reporting that it feels like his shoes are magnetized, bringing heels closer together in sitting and with ambulation.  OT encouraging pt and spouse to utilize targets on the floor or even visual lines with tiles on floor to encourage separation of feet.  Spouse also to check a particular pair of shoes as some shoes with wider soles. LB dressing: OT elevated mat table to 21 to simulate home setup, as pt dressing from 21 height toilet for improved seat height to aid in standing afterwards.  OT placed dots on floor to provide visual cue to keep feet in alignment prior to and post attempts at simulated LB dressing.  Utilized trash bag to simulate pant, completing massed practice with lifting one leg to advance bag under foot and then back out.  Completing with RLE and then LLE.  OT providing initial hand over hand to facilitate sequencing.  Pt demonstrating improvements when doffing bag compared to donning with decreased sequencing and frequently pulling up on bag at foot.  OT providing cues to return foot to colored dot to maintain foot alignment at shoulder width.  OT educating on donning LLE first due to decreased ease with lifting LLE to allow for increased ease with LB dressing.  OT providing with handout from OT toolkit, recommending attempt to bring leg up to edge of couch or bedside  to allow for increased ease with reach towards foot.  However bed/couch may be too low of a seated height. Pt and spouse to attempt variations at home.    07/01/24 Large amplitude: forward weight shifting with hands on large therapy ball.  OT providing demonstration and cues, requiring increased cues for technique as pt with tendency to push down vs forward with ball.  Transitioned to reaching outside BOS and across midline in sitting with focus on trunk rotation and weight shifting while reaching towards vertical poles.  OT providing demonstration, close supervision, and cues for sequencing and amplitude.  Pt benefiting from cues throughout for amplitude and postural control to increase weight shifting and reach.  Pt benefits from targets for increased reach. Card sorting: engaged in flipping cards with RUE and then LUE with focus on large amplitude reach, external rotation.  OT providing cues to attend to stack of cards to increase attention and awareness as pt demonstrating decreasing ROM as he fatigued. OT educating on use of plate at home to allow for increased awareness if missing the target.    PATIENT EDUCATION: Education details: HEP for trunk rotation and to aid in LB dressing Person educated: Patient and Spouse Education method: Explanation, Facilities Manager, Verbal cues, and Handouts Education comprehension: verbalized understanding and needs further education  HOME EXERCISE PROGRAM: Access Code: [email or web address] URL: [email or web address] Date: 07/08/2024 Prepared by: [facility] - Outpatient  Rehab - [facility]  Exercises - Seated Trunk Rotation  - 2 x daily - 7 x weekly - 3 sets - 10 reps - Seated March  - 2 x daily - 7 x weekly - 3 sets - 10 reps - Seated Hip Flexion and External Rotation  - 2 x daily - 7 x weekly - 3 sets - 10 reps  GOALS: Goals reviewed with patient? Yes  SHORT TERM GOALS: Target date: 07/02/24  Pt and spouse will be independent in full body  HEP with focus on large amplitude movements as needed to maintain/preserve function as pertaining to ADLs/IADLs.  Baseline:  Goal status: in progress  2.  Pt will demonstrate ability to complete sequencing of approach  to transfer and sit > stand as needed to get up from toilet or other seating options in home by completing x3 throughout session without cues or physical assistance to maintain/preserve function.  Baseline:  Goal status: in progress  3.  Pt and spouse will report understanding of DME vs adaptive techniques/strategies and/or modifications to routines to increase safety with mobility and self-care tasks in the home.  Baseline:  Goal status: in progress    LONG TERM GOALS: Target date: 07/30/24  Pt will demonstrate improved ease and independence with sit > stand as evidenced by completing 5x sit > stand without posterior LOB and improved time by 5 seconds. Baseline: TBD, heavy posterior lean with sit > stand on eval Goal status: in progress  2.  Pt will demonstrate and/or report ability to complete UB dressing (both button up shirt and pull over sweatshirt) with min assist or better with use of AE and/or alternative strategies PRN.  Baseline: pt reporting 5-7 on PSFS with UB dressing Goal status: in progress  3.  Pt will demonstrate and/or report ability to complete LB dressing (donning pants) with min assist or better with use of AE and/or alternative strategies PRN.  Baseline: pt reporting 5 on PSFS Goal status: in progress  4.  Pt will demonstrate and/or report ability to complete LB dressing (donning shoes) with mod assist or better with use of AE and/or alternative strategies PRN.  Baseline: max-total assist for tie shoes and min assist to supervision for slip on - but prefers tie shoes for safety with walking Goal status: in progress  5.  Patient will report at least two-point increase in average PSFS score or at least three-point increase in a single activity score  indicating functionally significant improvement given minimum detectable change. Baseline: 4.6 Goal status: in progress   ASSESSMENT:  CLINICAL IMPRESSION: Patient is a 88 y.o. male who was seen today for occupational therapy treatment for advancements in Parkinson's disease, increased falls, and decreased independence with ADLs/IADLs.  Pt benefits greatly from use of U-step with ambulation, demonstrating improvements in gait speed and step length this session with good sequencing during turns and transfer to sit on edge of mat.  Pt still requiring significant amount of time and encouragement for each movement and to carry over to functional tasks.  Pt limited in trunk mobility d/t ankylosing spondylitis, however will to attempt simple trunk and hip movements to see if will aid in ADLs.    PERFORMANCE DEFICITS: in functional skills including ADLs, IADLs, ROM, strength, flexibility, mobility, balance, body mechanics, decreased knowledge of precautions, decreased knowledge of use of DME, and UE functional use and psychosocial skills including coping strategies, environmental adaptation, and routines and behaviors.   IMPAIRMENTS: are limiting patient from ADLs and IADLs.     PLAN:  OT FREQUENCY: 2x/week for 3 weeks, then 1x/week for remaining 3 weeks  OT DURATION: 6 weeks (asking 8 weeks to accommodate scheduling)  PLANNED INTERVENTIONS: 02831 OT Re-evaluation, 97535 self care/ADL training, 02889 therapeutic exercise, 97530 therapeutic activity, 97112 neuromuscular re-education, 97140 manual therapy, 97035 ultrasound, 97032 electrical stimulation (manual), functional mobility training, psychosocial skills training, energy conservation, coping strategies training, patient/family education, and DME and/or AE instructions  RECOMMENDED OTHER SERVICES: NA  CONSULTED AND AGREED WITH PLAN OF CARE: Patient and family member/caregiver  PLAN FOR NEXT SESSION: massed practice with don/doff jacket;  educate on modified technique for button up and pull over shirts, problem solve height of chair to increase ease with LB dressing and  donning shoes, LB dressing, transfers with 90* turns and big steps, large amplitude exercises    Kaislyn Gulas, OTR/L 07/08/2024, 12:49 PM  [facility] at [facility] [address] [place], [place] [address] Phone # [phone] Fax # [phone]

## 2024-07-13 ENCOUNTER — Ambulatory Visit: Admitting: Occupational Therapy

## 2024-07-13 ENCOUNTER — Ambulatory Visit

## 2024-07-13 DIAGNOSIS — G20A2 Parkinson's disease without dyskinesia, with fluctuations: Secondary | ICD-10-CM | POA: Diagnosis not present

## 2024-07-13 DIAGNOSIS — R262 Difficulty in walking, not elsewhere classified: Secondary | ICD-10-CM

## 2024-07-13 DIAGNOSIS — R2681 Unsteadiness on feet: Secondary | ICD-10-CM | POA: Diagnosis not present

## 2024-07-13 DIAGNOSIS — M6281 Muscle weakness (generalized): Secondary | ICD-10-CM

## 2024-07-13 DIAGNOSIS — R29818 Other symptoms and signs involving the nervous system: Secondary | ICD-10-CM | POA: Diagnosis not present

## 2024-07-13 DIAGNOSIS — R293 Abnormal posture: Secondary | ICD-10-CM | POA: Diagnosis not present

## 2024-07-13 DIAGNOSIS — R2689 Other abnormalities of gait and mobility: Secondary | ICD-10-CM

## 2024-07-13 NOTE — Therapy (Signed)
 OUTPATIENT PHYSICAL THERAPY NEURO TREATMENT  Patient Name: [name] MRN: [id number] DOB:[birth year], 88 y.o., male Today's Date: 07/13/2024   PCP: Yolande Toribio MATSU, MD REFERRING PROVIDER: Buck Saucer, MD      END OF SESSION:  PT End of Session - 07/13/24 1149     Visit Number 21    Number of Visits 23    Date for Recertification  07/26/24    Authorization Type Medicare/[employer]    Progress Note Due on Visit 27    PT Start Time 1145    PT Stop Time 1230    PT Time Calculation (min) 45 min    Equipment Utilized During Treatment Gait belt    Activity Tolerance Patient tolerated treatment well    Behavior During Therapy WFL for tasks assessed/performed              Past Medical History:  Diagnosis Date   Ankylosing spondylitis (HCC) dx'd ~ 1974   Arthritis    back (08/10/2015)   BENIGN PROSTATIC HYPERTROPHY, WITH OBSTRUCTION 01/15/2010   Bleeding duodenal ulcer    Bleeding esophageal ulcer    Bleeding stomach ulcer    GERD 02/22/2008   History of blood transfusion 08/26/1986   lost ~ 1/2 of my blood volume from multiple bleeding ulcers   History of hiatal hernia    HYPERGLYCEMIA 11/18/2007   HYPERLIPIDEMIA 02/22/2008   HYPERTENSION, UNSPECIFIED 11/10/2009   Kidney stones    Osteoarthritis    c-spine   Parkinson's disease (HCC)    PEPTIC ULCER DISEASE 11/17/2007   Situational depression    son died in MVA June 29, 2015   TIA (transient ischemic attack)    not that I know of in the past; they are trying to determine if I've had one today (08/10/2015)   Past Surgical History:  Procedure Laterality Date   CATARACT EXTRACTION W/ INTRAOCULAR LENS  IMPLANT, BILATERAL Bilateral 2013   Patient Active Problem List   Diagnosis Date Noted   Ankylosing spondylitis of cervicothoracic region (HCC) 10/16/2016   TIA (transient ischemic attack) 08/10/2015   Carotid stenosis 08/04/2014   Hyperlipidemia 02/21/2012   BENIGN PROSTATIC HYPERTROPHY, WITH OBSTRUCTION  01/15/2010   Essential hypertension 11/10/2009   GERD 02/22/2008   NEPHROLITHIASIS, HX OF 11/17/2007    ONSET DATE: PD x years onset  REFERRING DIAG: R26.81 (ICD-10-CM) - Unsteadiness on feet R29.3 (ICD-10-CM) - Abnormal posture R27.8 (ICD-10-CM) - Other lack of coordination G20.A2 (ICD-10-CM) - Parkinson's disease with fluctuating manifestations, unspecified whether dyskinesia present (HCC)  THERAPY DIAG:  Other symptoms and signs involving the nervous system  Muscle weakness (generalized)  Abnormal posture  Unsteadiness on feet  Other abnormalities of gait and mobility  Parkinson's disease with fluctuating manifestations, unspecified whether dyskinesia present (HCC)  Difficulty in walking, not elsewhere classified  Rationale for Evaluation and Treatment: Rehabilitation  SUBJECTIVE:  SUBJECTIVE STATEMENT: Doing pretty well.   Pt accompanied by: significant other  PERTINENT HISTORY: complex medical history of peptic ulcer disease with history of upper GI bleed, TIA, depression, degenerative disc disease in the neck, osteoarthritis, kidney stones, hyperlipidemia, hypertension, history of hiatal hernia, BPH, arthritis, and ankylosing spondylitis, and parkinsonism  PAIN:  Are you having pain? Not really  PRECAUTIONS: Fall  RED FLAGS: None   WEIGHT BEARING RESTRICTIONS: No  FALLS: Has patient fallen in last 3 months? Unsure as to number, notes fall/LOB every other week  LIVING ENVIRONMENT: Lives with: lives with their spouse Lives in: House/apartment Stairs: Ramp to enter, stairs to loft and basement, ground floor set-up Has following equipment at home: [name] - 2 wheeled  PLOF: Needs assistance with ADLs, Needs assistance with homemaking, and Needs assistance with gait  PATIENT  GOALS:   OBJECTIVE:   TODAY'S TREATMENT: 07/13/24 Activity Comments  Sit to stand 1x10 Tactile cues to promote trunk flexion. 20 and 16   Knee extension 4x10 5#, visual target to sequence/isolate  Seated temper tantrums 5# 4x15 sec w/ and without assist to sequence  Alt stair tap 20x  5#, 6  Balance/coordination Activities requiring squatting, bending, throwing, catching, kicking  NU-step level 4 x 8 min          PATIENT EDUCATION: Education details: discussed using U step when trying to carry items rather than going without AD Person educated: Patient and Spouse Education method: Explanation Education comprehension: verbalized understanding       Note: Objective measures were completed at Evaluation unless otherwise noted.  DIAGNOSTIC FINDINGS:   COGNITION: Overall cognitive status: Within functional limits for tasks assessed and some instances of difficulty attending to task/sequencing   SENSATION: Not tested  COORDINATION: Deficits to rapid alternating movements    MUSCLE TONE: rigidity  MUSCLE LENGTH: 10-15 degree knee flexion contracture bilat   POSTURE: rounded shoulders, forward head, and increased thoracic kyphosis  LOWER EXTREMITY ROM:     Right ankle ROM limits from fusion  LOWER EXTREMITY MMT:    4/5 gross BLE  BED MOBILITY:  Findings: NT, reports fluctuating performance  TRANSFERS: Sit to stand: Modified independence and CGA  Assistive device utilized: U-Step     Stand to sit: CGA  Assistive device utilized: u-step     Chair to chair: Modified independence and CGA  Assistive device utilized: U-step         CURB:  Findings: CGA-min A  STAIRS: Findings: Comments: CGA-min A GAIT: Findings: Distance walked: 150, Assistive device utilized:U-step, Level of assistance: CGA, and Comments: freezing in turns  FUNCTIONAL TESTS:  5 times sit to stand: 47.25 sec Timed up and go (TUG): 36 sec w/ U-step 10 meter walk test: 23.34 sec = 1.4  ft/sec w/ U-step Berg Balance Scale:  180 degree turns: 16-18 steps  Item Test date: 01/20/24 Test date: 06/01/23 Test date:   Sitting to standing 2. able to stand using hands after several tries 2. able to stand using hands after several tries  3. able to stand 2 minutes with supervision  4. able to sit safely and securely for 2 minutes   2. uses back of legs against chair to control descent  2. able to transfer with verbal cuing and/or supervision  3. able to stand 10 seconds with supervision  3. able to place feet together independently and stand 1 minute with supervision  3. can reach forward 12 cm (5 inches)    3. able to pick up slipper  but needs supervision  1. needs supervision when turning    1. needs close supervision or verbal cuing  1. able to complete > 2 steps needs minimal assist    0. loses balance while stepping or standing   0. unable to try of needs assist to prevent fall       Insert OPRCBERGREEVAL SmartPhrase at re-test date  2. Standing unsupported 3. able to stand 2 minutes with supervision    3. Sitting with back unsupported, feet supported 4. able to sit safely and securely for 2 minutes    4. Standing to sitting 2. uses back of legs against chair to control descent    5. Pivot transfer  2. able to transfer with verbal cuing and/or supervision    6. Standing unsupported with eyes closed 3. able to stand 10 seconds with supervision    7. Standing unsupported with feet together 3. able to place feet together independently and stand 1 minute with supervision    8. Reaching forward with outstretched arms while standing 3. can reach forward 12 cm (5 inches)    9. Pick up object from the floor from standing 0. unable to try/needs assistance to keep from losing balance or falling    10. Turning to look behind over left and right shoulders while standing 2. turns sideways but only maintains balance    11. Turn 360 degrees 0. needs assistance while turning    12.  Place alternate foot on step or stool while standing unsupported 1. able to complete > 2 steps needs minimal assist    13. Standing unsupported one foot in front 0. loses balance while stepping or standing    14. Standing on one leg 0. unable to try of needs assist to prevent fall      Total Score 25/56 Total Score  28 /56 Total Score /56     PATIENT SURVEYS:  Freezing of gait questionnaire provided to fill out and return                                                                                                                              TREATMENT DATE:     PATIENT EDUCATION:a Education details: assessment details, rationale of tx interventions Person educated: Patient and Spouse Education method: Explanation Education comprehension: verbalized understanding  HOME EXERCISE PROGRAM: TBD  GOALS: Goals reviewed with patient? Yes  SHORT TERM GOALS: Target date: 06/21/2024      Patient will perform HEP with family/caregiver supervision for improved strength, balance, transfers, and gait  Baseline: Goal status: MET  2.  Demo improved postural control and BLE strength per time 35 sec 5xSTS test Baseline: 47 sec; 37 sec; 38 sec Goal status: IN PROGRESS 05/31/24    LONG TERM GOALS: Target date: 07/26/2024      Maintain score 28-30/56 Berg Balance Test for safety during ADL Baseline: 25/56; 27/56; 29/56; 28/56 Goal status: IN PROGRESS 05/31/24  2.  Demo  improved safety/efficiency of turning negotiating 180 degree turn in 10-12 steps Baseline: 16-18 w/ U-step; 20-22 w/ U-step; 12-18 steps given min A for RW negotiation 04/13/24; 20+ steps and min A to prevent posterior LOB 05/04/24 Goal status: IN PROGRESS 05/04/24  3.  Demo improved safety with mobility per time 25 sec TUG test Baseline: 36 sec w/ U-step; 1 min 30 sec w/ U-step; 1 min 10 sec with RW and verbal cueing for safe turning/navigation; 47 sec w/ U-step Goal status: IN PROGRESS 05/31/24  4.  Spouse/patient to  teach-back strategies to help with reducing freezing of gait to improve safety with turning to sit w/ U-step Baseline:  Goal status: IN PROGRESS   ASSESSMENT:  CLINICAL IMPRESSION: Transfer training techniques to reinforce trunk flexion over BOS to decrease retropulsion in sit to stand w/ 90% success rate using tactile cue of sliding hands up arm rests of chair/walker. Seated and standing balance and coordination activities to promote single limb stance and unsupported standing against postural perturbations.  Assistance for facilitating rapid alternating movements with carryover lasting 10-18 sec when assist removed.  Responding well to visual/tactile cues the best for mobility and balance.   OBJECTIVE IMPAIRMENTS: Abnormal gait, decreased activity tolerance, decreased balance, decreased coordination, decreased mobility, difficulty walking, decreased strength, improper body mechanics, and postural dysfunction.    PLAN:  PT FREQUENCY: 1x/week  PT DURATION: 6 weeks  PLANNED INTERVENTIONS: 97750- Physical Performance Testing, 97110-Therapeutic exercises, 97530- Therapeutic activity, [id number]- Neuromuscular re-education, 97535- Self Care, [id number]- Manual therapy, [phone]- Gait training, and [phone]- Aquatic Therapy  PLAN FOR NEXT SESSION: metronome with U-step for sequencing steps for turning to perform stand to sit transfer  11:49 AM, 07/13/24 M. Kelly Sevana Grandinetti, PT, DPT Physical Therapist- [place] Office Number: [phone]

## 2024-07-13 NOTE — Therapy (Signed)
 OUTPATIENT OCCUPATIONAL THERAPY PARKINSON'S  Treatment Note  Patient Name: [name] MRN: [id number] DOB:04/10/1936, 88 y.o., male Today's Date: 07/13/2024  PCP: Yolande Toribio MATSU, MD REFERRING PROVIDER: Cary No, NP  END OF SESSION:  OT End of Session - 07/13/24 1242     Visit Number 8    Number of Visits 10    Date for Recertification  07/30/24    Authorization Type Medicare A &B    OT Start Time 1103    OT Stop Time 1145    OT Time Calculation (min) 42 min                 Past Medical History:  Diagnosis Date   Ankylosing spondylitis (HCC) dx'd ~ 1974   Arthritis    back (08/10/2015)   BENIGN PROSTATIC HYPERTROPHY, WITH OBSTRUCTION 01/15/2010   Bleeding duodenal ulcer    Bleeding esophageal ulcer    Bleeding stomach ulcer    GERD 02/22/2008   History of blood transfusion 08/26/1986   lost ~ 1/2 of my blood volume from multiple bleeding ulcers   History of hiatal hernia    HYPERGLYCEMIA 11/18/2007   HYPERLIPIDEMIA 02/22/2008   HYPERTENSION, UNSPECIFIED 11/10/2009   Kidney stones    Osteoarthritis    c-spine   Parkinson's disease (HCC)    PEPTIC ULCER DISEASE 11/17/2007   Situational depression    son died in MVA Jul 01, 2015   TIA (transient ischemic attack)    not that I know of in the past; they are trying to determine if I've had one today (08/10/2015)   Past Surgical History:  Procedure Laterality Date   CATARACT EXTRACTION W/ INTRAOCULAR LENS  IMPLANT, BILATERAL Bilateral 2013   Patient Active Problem List   Diagnosis Date Noted   Ankylosing spondylitis of cervicothoracic region (HCC) 10/16/2016   TIA (transient ischemic attack) 08/10/2015   Carotid stenosis 08/04/2014   Hyperlipidemia 02/21/2012   BENIGN PROSTATIC HYPERTROPHY, WITH OBSTRUCTION 01/15/2010   Essential hypertension 11/10/2009   GERD 02/22/2008   NEPHROLITHIASIS, HX OF 11/17/2007    ONSET DATE: referral date 05/07/24  REFERRING DIAG: G20.A2 (ICD-10-CM) -  Parkinson's disease with fluctuating manifestations, unspecified whether dyskinesia present  THERAPY DIAG:  Other symptoms and signs involving the nervous system  Muscle weakness (generalized)  Unsteadiness on feet  Rationale for Evaluation and Treatment: Rehabilitation  SUBJECTIVE:   SUBJECTIVE STATEMENT: Pt's spouse reporting that he fell backwardswhen trying to get up from the toilet, due to retropulsion, resulting in cracking the toilet seat but he did not fall to the floor.    Pt accompanied by: self and significant other (spouse, [name])  PERTINENT HISTORY: history of Parkinson's disease, duodenal and esophageal ulcers, ankylosing spondylitis, hypertension and hyperlipidemia   PRECAUTIONS: Fall  WEIGHT BEARING RESTRICTIONS: No  PAIN:  Are you having pain? No  FALLS: Has patient fallen in last 6 months? Yes. Number of falls : falling more than once a week, most often in the bathroom or when attempting to walk without walker  LIVING ENVIRONMENT: Lives with: lives with their spouse Lives in: House/apartment Stairs: Yes: External: 2-3 steps; has a ramp at entrance with raised railings Internal: steps to basement and steps to loft - however pt does not need to go up/down those at this time. Has following equipment at home: [name] - 2 wheeled, Walker - 4 wheeled, U-step, bed side commode, Shower chair, Ramped entry, and transport chair; 21 commode and arm rests that are attached to toilet seat, doorways have been widened  PLOF: Requires assistive device for independence and Needs assistance with ADLs; [place]  PATIENT GOALS: safety with bathroom transfers and strategies for dressing tasks  OBJECTIVE:  Note: Objective measures were completed at Evaluation unless otherwise noted.  HAND DOMINANCE: Right  ADLs: Overall ADLs: fluctuates drastically; can take up to 2 hours for bathing and dressing Transfers/ambulation related to ADLs:  Utilizing U-step for mobility in and outside of the home  Eating: he's doing well with the eating manageable UB Dressing: occasional assistance, button up shirt is easier than pullover LB Dressing: getting dressed on a closed commode seat Toileting: Pt will occasionally require assistance to get up from toilet, has had falls attempting to transfer both to and from toilet Bathing: sponge bathing at sink without assistance from shower chair seated at sink, spouse assisting with washing hair; Setup for washing feet  Tub Shower transfers: not currently performing Equipment: Shower seat with back and Walk in shower with 5.5 ledge with 30 door  IADLs: Light housekeeping: washes dishes when he is feeling strong, however frequent onset of weakness Meal Prep: Pt was primary cook prior to fall in 2023, but has returned to participation in aspects of meal prep Community mobility: recently renewed drivers license, but is not driving  MOBILITY STATUS: Needs Assist: CGA with mobility with RW vs U-step and Hx of falls   POSTURE COMMENTS:  rounded shoulders and forward head, posterior pelvic tilt and R lean    FUNCTIONAL OUTCOME MEASURES:    UE ROM:  all movements are slowed, decreased shoulder flexion d/t ankylosing spondylitis   UE MMT:   grossly 3+ to 4/5 overall  COGNITION: Overall cognitive status: spouse reports thinking continues to be slower  OBSERVATIONS: Bradykinesia                                                                                                                    TREATMENT DATE:  07/13/24 OT educating pt and spouse on website - [email or web address] - that may aid in problem solving safety in home and decrease fall risk for pt.  This website was created by an OT to aid in prolonging independent living, and maximize the health of homes. Jacket: pt demonstrating good technique when doffing jacket at beginning of session with ability to advance jacket off his  shoulders and then completely pull off arms while standing with close supervision. LB dressing: pt still with decreased ease with lifting feet, especially while reaching forwards.  Pt continues to be fearful of falling backwards in sitting on edge of bed or couch.  OT reiterated recommendation to trial sitting in shower chair against the wall to see if that provides adequate height and back and side support.   Large amplitude: engaged in seated side stepping out to side and back to middle.  OT providing visual targets on the floor and demonstration to increase amplitude and sequencing.  Pt continues to demonstrate deceased space between feet when returning to midline. AE: OT educated on  adaptive equipment to aid in meal prep with jar openers and adaptive cutting board to aid both pt and spouse as pt was primary cook but does not have the balance or stamina to continue to cook at the capacity that he once was.   07/08/24 Engaged in seated exercises at edge of mat with focus on breaking down LB dressing into hip flexion and external rotation. Engaged in massed practice of hip flexion with seated marching, followed by attempts at hip abduction to bring leg to edge of mat.  Pt able to achieve on R but not on L side.  Transitioned to attempting to flex left hip to raise leg while reaching towards shin with R hand to address trunk rotation.  Pt with decreased ability to complete, therefore transitioned to isolated trunk rotation while holding ball in B hands.  OT providing cues for pt to turn head to look at target to facilitate increase UB rotation.  Utilized scarf to simulate LB dressing with RLE in partial hip external rotation.  Pt still requiring significant amount of time and encouragement for each movement and to carry over to functional tasks. Self-care: engaged in discussion about [employer] Slip-ins as an option to aid in donning shoes and eliminating need to tie shoes.  Spouse to look further into this  option.    07/06/24 Pt reporting that it feels like his shoes are magnetized, bringing heels closer together in sitting and with ambulation.  OT encouraging pt and spouse to utilize targets on the floor or even visual lines with tiles on floor to encourage separation of feet.  Spouse also to check a particular pair of shoes as some shoes with wider soles. LB dressing: OT elevated mat table to 21 to simulate home setup, as pt dressing from 21 height toilet for improved seat height to aid in standing afterwards.  OT placed dots on floor to provide visual cue to keep feet in alignment prior to and post attempts at simulated LB dressing.  Utilized trash bag to simulate pant, completing massed practice with lifting one leg to advance bag under foot and then back out.  Completing with RLE and then LLE.  OT providing initial hand over hand to facilitate sequencing.  Pt demonstrating improvements when doffing bag compared to donning with decreased sequencing and frequently pulling up on bag at foot.  OT providing cues to return foot to colored dot to maintain foot alignment at shoulder width.  OT educating on donning LLE first due to decreased ease with lifting LLE to allow for increased ease with LB dressing.  OT providing with handout from OT toolkit, recommending attempt to bring leg up to edge of couch or bedside to allow for increased ease with reach towards foot.  However bed/couch may be too low of a seated height. Pt and spouse to attempt variations at home.   PATIENT EDUCATION: Education details: HEP for LB mobility, trunk rotation, and to aid in LB dressing; adaptive equipment and resources for safety inhome Person educated: Patient and Spouse Education method: Explanation, Demonstration, Verbal cues, and Handouts Education comprehension: verbalized understanding and needs further education  HOME EXERCISE PROGRAM: Access Code: [email or web address] URL: [email or web address] Date:  07/08/2024 Prepared by: [facility] - Outpatient  Rehab - [facility]  Exercises - Seated Trunk Rotation  - 2 x daily - 7 x weekly - 3 sets - 10 reps - Seated March  - 2 x daily - 7 x weekly - 3 sets - 10 reps - Seated  Hip Flexion and External Rotation  - 2 x daily - 7 x weekly - 3 sets - 10 reps  GOALS: Goals reviewed with patient? Yes  SHORT TERM GOALS: Target date: 07/02/24  Pt and spouse will be independent in full body HEP with focus on large amplitude movements as needed to maintain/preserve function as pertaining to ADLs/IADLs.  Baseline:  Goal status: in progress  2.  Pt will demonstrate ability to complete sequencing of approach to transfer and sit > stand as needed to get up from toilet or other seating options in home by completing x3 throughout session without cues or physical assistance to maintain/preserve function.  Baseline:  Goal status: in progress  3.  Pt and spouse will report understanding of DME vs adaptive techniques/strategies and/or modifications to routines to increase safety with mobility and self-care tasks in the home.  Baseline:  Goal status: in progress    LONG TERM GOALS: Target date: 07/30/24  Pt will demonstrate improved ease and independence with sit > stand as evidenced by completing 5x sit > stand without posterior LOB and improved time by 5 seconds. Baseline: TBD, heavy posterior lean with sit > stand on eval Goal status: in progress  2.  Pt will demonstrate and/or report ability to complete UB dressing (both button up shirt and pull over sweatshirt) with min assist or better with use of AE and/or alternative strategies PRN.  Baseline: pt reporting 5-7 on PSFS with UB dressing Goal status: in progress  3.  Pt will demonstrate and/or report ability to complete LB dressing (donning pants) with min assist or better with use of AE and/or alternative strategies PRN.  Baseline: pt reporting 5 on PSFS Goal status: in progress  4.  Pt will  demonstrate and/or report ability to complete LB dressing (donning shoes) with mod assist or better with use of AE and/or alternative strategies PRN.  Baseline: max-total assist for tie shoes and min assist to supervision for slip on - but prefers tie shoes for safety with walking Goal status: in progress  5.  Patient will report at least two-point increase in average PSFS score or at least three-point increase in a single activity score indicating functionally significant improvement given minimum detectable change. Baseline: 4.6 Goal status: in progress   ASSESSMENT:  CLINICAL IMPRESSION: Patient is a 88 y.o. male who was seen today for occupational therapy treatment for advancements in Parkinson's disease, increased falls, and decreased independence with ADLs/IADLs.  Pt still requiring significant amount of time and encouragement for each movement, benefiting from demonstration, verbal cues, and tactile cues for seated stepping activity.  Pt and spouse appreciative of resources for home safety and modifications as well as recommendations to modify setup and/or routine to aid in ADLs and meal prep.  Pt limited in trunk mobility d/t ankylosing spondylitis, however willing to continue to engage in simple trunk and hip movements to see if will aid in ADLs.    PERFORMANCE DEFICITS: in functional skills including ADLs, IADLs, ROM, strength, flexibility, mobility, balance, body mechanics, decreased knowledge of precautions, decreased knowledge of use of DME, and UE functional use and psychosocial skills including coping strategies, environmental adaptation, and routines and behaviors.   IMPAIRMENTS: are limiting patient from ADLs and IADLs.     PLAN:  OT FREQUENCY: 2x/week for 3 weeks, then 1x/week for remaining 3 weeks  OT DURATION: 6 weeks (asking 8 weeks to accommodate scheduling)  PLANNED INTERVENTIONS: 02831 OT Re-evaluation, 97535 self care/ADL training, 02889 therapeutic exercise, 97530  therapeutic activity, 97112 neuromuscular re-education, 97140 manual therapy, 97035 ultrasound, 02967 electrical stimulation (manual), functional mobility training, psychosocial skills training, energy conservation, coping strategies training, patient/family education, and DME and/or AE instructions  RECOMMENDED OTHER SERVICES: NA  CONSULTED AND AGREED WITH PLAN OF CARE: Patient and family member/caregiver  PLAN FOR NEXT SESSION: massed practice with don/doff jacket; educate on modified technique for button up and pull over shirts, problem solve height of chair to increase ease with LB dressing and donning shoes, LB dressing, transfers with 90* turns and big steps, large amplitude exercises    Mikala Podoll, OTR/L 07/13/2024, 12:42 PM  [facility] at [facility] [address] [place], [place] [address] Phone # [phone] Fax # [phone]

## 2024-07-20 ENCOUNTER — Ambulatory Visit: Admitting: Occupational Therapy

## 2024-07-20 ENCOUNTER — Ambulatory Visit

## 2024-07-20 DIAGNOSIS — R293 Abnormal posture: Secondary | ICD-10-CM | POA: Diagnosis not present

## 2024-07-20 DIAGNOSIS — R29818 Other symptoms and signs involving the nervous system: Secondary | ICD-10-CM

## 2024-07-20 DIAGNOSIS — R2689 Other abnormalities of gait and mobility: Secondary | ICD-10-CM | POA: Diagnosis not present

## 2024-07-20 DIAGNOSIS — G20A2 Parkinson's disease without dyskinesia, with fluctuations: Secondary | ICD-10-CM | POA: Diagnosis not present

## 2024-07-20 DIAGNOSIS — R262 Difficulty in walking, not elsewhere classified: Secondary | ICD-10-CM

## 2024-07-20 DIAGNOSIS — M6281 Muscle weakness (generalized): Secondary | ICD-10-CM

## 2024-07-20 DIAGNOSIS — R2681 Unsteadiness on feet: Secondary | ICD-10-CM

## 2024-07-20 NOTE — Therapy (Signed)
 OUTPATIENT PHYSICAL THERAPY NEURO TREATMENT  Patient Name: [name] MRN: [id number] DOB:[birth year], 88 y.o., male Today's Date: 07/20/2024   PCP: Yolande Toribio MATSU, MD REFERRING PROVIDER: Buck Saucer, MD      END OF SESSION:  PT End of Session - 07/20/24 1015     Visit Number 22    Number of Visits 23    Date for Recertification  07/26/24    Authorization Type Medicare/[employer]    Progress Note Due on Visit 27    PT Start Time 1100    PT Stop Time 1145    PT Time Calculation (min) 45 min    Equipment Utilized During Treatment Gait belt    Activity Tolerance Patient tolerated treatment well    Behavior During Therapy WFL for tasks assessed/performed              Past Medical History:  Diagnosis Date   Ankylosing spondylitis (HCC) dx'd ~ 1974   Arthritis    back (08/10/2015)   BENIGN PROSTATIC HYPERTROPHY, WITH OBSTRUCTION 01/15/2010   Bleeding duodenal ulcer    Bleeding esophageal ulcer    Bleeding stomach ulcer    GERD 02/22/2008   History of blood transfusion 08/26/1986   lost ~ 1/2 of my blood volume from multiple bleeding ulcers   History of hiatal hernia    HYPERGLYCEMIA 11/18/2007   HYPERLIPIDEMIA 02/22/2008   HYPERTENSION, UNSPECIFIED 11/10/2009   Kidney stones    Osteoarthritis    c-spine   Parkinson's disease (HCC)    PEPTIC ULCER DISEASE 11/17/2007   Situational depression    son died in MVA 06-08-15   TIA (transient ischemic attack)    not that I know of in the past; they are trying to determine if I've had one today (08/10/2015)   Past Surgical History:  Procedure Laterality Date   CATARACT EXTRACTION W/ INTRAOCULAR LENS  IMPLANT, BILATERAL Bilateral 2013   Patient Active Problem List   Diagnosis Date Noted   Ankylosing spondylitis of cervicothoracic region (HCC) 10/16/2016   TIA (transient ischemic attack) 08/10/2015   Carotid stenosis 08/04/2014   Hyperlipidemia 02/21/2012   BENIGN PROSTATIC HYPERTROPHY, WITH OBSTRUCTION  01/15/2010   Essential hypertension 11/10/2009   GERD 02/22/2008   NEPHROLITHIASIS, HX OF 11/17/2007    ONSET DATE: PD x years onset  REFERRING DIAG: R26.81 (ICD-10-CM) - Unsteadiness on feet R29.3 (ICD-10-CM) - Abnormal posture R27.8 (ICD-10-CM) - Other lack of coordination G20.A2 (ICD-10-CM) - Parkinson's disease with fluctuating manifestations, unspecified whether dyskinesia present (HCC)  THERAPY DIAG:  Muscle weakness (generalized)  Abnormal posture  Unsteadiness on feet  Other abnormalities of gait and mobility  Parkinson's disease with fluctuating manifestations, unspecified whether dyskinesia present (HCC)  Difficulty in walking, not elsewhere classified  Rationale for Evaluation and Treatment: Rehabilitation  SUBJECTIVE:  SUBJECTIVE STATEMENT: Doing pretty well.   Pt accompanied by: significant other  PERTINENT HISTORY: complex medical history of peptic ulcer disease with history of upper GI bleed, TIA, depression, degenerative disc disease in the neck, osteoarthritis, kidney stones, hyperlipidemia, hypertension, history of hiatal hernia, BPH, arthritis, and ankylosing spondylitis, and parkinsonism  PAIN:  Are you having pain? Not really  PRECAUTIONS: Fall  RED FLAGS: None   WEIGHT BEARING RESTRICTIONS: No  FALLS: Has patient fallen in last 3 months? Unsure as to number, notes fall/LOB every other week  LIVING ENVIRONMENT: Lives with: lives with their spouse Lives in: House/apartment Stairs: Ramp to enter, stairs to loft and basement, ground floor set-up Has following equipment at home: [name] - 2 wheeled  PLOF: Needs assistance with ADLs, Needs assistance with homemaking, and Needs assistance with gait  PATIENT GOALS:   OBJECTIVE:   TODAY'S TREATMENT:  07/20/24 Activity Comments  NU-step level 3 x 9 min For rapid alternating movement and cues for HR/intensity  Sit to stand transfers Training in hand placement with improved carryover to trunk flexion over BOS  Dynamic balance               TODAY'S TREATMENT: 07/13/24 Activity Comments  Sit to stand 1x10 Tactile cues to promote trunk flexion. 20 and 16   Knee extension 4x10 5#, visual target to sequence/isolate  Seated temper tantrums 5# 4x15 sec w/ and without assist to sequence  Alt stair tap 20x  5#, 6  Balance/coordination Activities requiring squatting, bending, throwing, catching, kicking  NU-step level 4 x 8 min          PATIENT EDUCATION: Education details: discussed using U step when trying to carry items rather than going without AD Person educated: Patient and Spouse Education method: Explanation Education comprehension: verbalized understanding       Note: Objective measures were completed at Evaluation unless otherwise noted.  DIAGNOSTIC FINDINGS:   COGNITION: Overall cognitive status: Within functional limits for tasks assessed and some instances of difficulty attending to task/sequencing   SENSATION: Not tested  COORDINATION: Deficits to rapid alternating movements    MUSCLE TONE: rigidity  MUSCLE LENGTH: 10-15 degree knee flexion contracture bilat   POSTURE: rounded shoulders, forward head, and increased thoracic kyphosis  LOWER EXTREMITY ROM:     Right ankle ROM limits from fusion  LOWER EXTREMITY MMT:    4/5 gross BLE  BED MOBILITY:  Findings: NT, reports fluctuating performance  TRANSFERS: Sit to stand: Modified independence and CGA  Assistive device utilized: U-Step     Stand to sit: CGA  Assistive device utilized: u-step     Chair to chair: Modified independence and CGA  Assistive device utilized: U-step         CURB:  Findings: CGA-min A  STAIRS: Findings: Comments: CGA-min A GAIT: Findings: Distance walked: 150,  Assistive device utilized:U-step, Level of assistance: CGA, and Comments: freezing in turns  FUNCTIONAL TESTS:  5 times sit to stand: 47.25 sec Timed up and go (TUG): 36 sec w/ U-step 10 meter walk test: 23.34 sec = 1.4 ft/sec w/ U-step Lars Balance Scale:  180 degree turns: 16-18 steps  Item Test date: 01/20/24 Test date: 06/01/23 Test date:   Sitting to standing 2. able to stand using hands after several tries 2. able to stand using hands after several tries  3. able to stand 2 minutes with supervision  4. able to sit safely and securely for 2 minutes   2. uses back of legs against chair  to control descent  2. able to transfer with verbal cuing and/or supervision  3. able to stand 10 seconds with supervision  3. able to place feet together independently and stand 1 minute with supervision  3. can reach forward 12 cm (5 inches)    3. able to pick up slipper but needs supervision  1. needs supervision when turning    1. needs close supervision or verbal cuing  1. able to complete > 2 steps needs minimal assist    0. loses balance while stepping or standing   0. unable to try of needs assist to prevent fall       Insert OPRCBERGREEVAL SmartPhrase at re-test date  2. Standing unsupported 3. able to stand 2 minutes with supervision    3. Sitting with back unsupported, feet supported 4. able to sit safely and securely for 2 minutes    4. Standing to sitting 2. uses back of legs against chair to control descent    5. Pivot transfer  2. able to transfer with verbal cuing and/or supervision    6. Standing unsupported with eyes closed 3. able to stand 10 seconds with supervision    7. Standing unsupported with feet together 3. able to place feet together independently and stand 1 minute with supervision    8. Reaching forward with outstretched arms while standing 3. can reach forward 12 cm (5 inches)    9. Pick up object from the floor from standing 0. unable to try/needs assistance  to keep from losing balance or falling    10. Turning to look behind over left and right shoulders while standing 2. turns sideways but only maintains balance    11. Turn 360 degrees 0. needs assistance while turning    12. Place alternate foot on step or stool while standing unsupported 1. able to complete > 2 steps needs minimal assist    13. Standing unsupported one foot in front 0. loses balance while stepping or standing    14. Standing on one leg 0. unable to try of needs assist to prevent fall      Total Score 25/56 Total Score  28 /56 Total Score /56     PATIENT SURVEYS:  Freezing of gait questionnaire provided to fill out and return                                                                                                                              TREATMENT DATE:     PATIENT EDUCATION:a Education details: assessment details, rationale of tx interventions Person educated: Patient and Spouse Education method: Explanation Education comprehension: verbalized understanding  HOME EXERCISE PROGRAM: TBD  GOALS: Goals reviewed with patient? Yes  SHORT TERM GOALS: Target date: 06/21/2024      Patient will perform HEP with family/caregiver supervision for improved strength, balance, transfers, and gait  Baseline: Goal status: MET  2.  Demo improved postural control and BLE strength per time  35 sec 5xSTS test Baseline: 47 sec; 37 sec; 38 sec Goal status: IN PROGRESS 05/31/24    LONG TERM GOALS: Target date: 07/26/2024      Maintain score 28-30/56 Berg Balance Test for safety during ADL Baseline: 25/56; 27/56; 29/56; 28/56 Goal status: IN PROGRESS 05/31/24  2.  Demo improved safety/efficiency of turning negotiating 180 degree turn in 10-12 steps Baseline: 16-18 w/ U-step; 20-22 w/ U-step; 12-18 steps given min A for RW negotiation 04/13/24; 20+ steps and min A to prevent posterior LOB 05/04/24 Goal status: IN PROGRESS 05/04/24  3.  Demo improved safety with mobility  per time 25 sec TUG test Baseline: 36 sec w/ U-step; 1 min 30 sec w/ U-step; 1 min 10 sec with RW and verbal cueing for safe turning/navigation; 47 sec w/ U-step Goal status: IN PROGRESS 05/31/24  4.  Spouse/patient to teach-back strategies to help with reducing freezing of gait to improve safety with turning to sit w/ U-step Baseline:  Goal status: IN PROGRESS   ASSESSMENT:  CLINICAL IMPRESSION: NU-step for rapid alternating movement and discussion with pt and spouse regarding multiple benefits of this mode of training in regards to PD, verbalized understanding. Dynamic standing balance activities to improve postural control, single limb support, and unsupported standing to improve safety with transfers and ADL  OBJECTIVE IMPAIRMENTS: Abnormal gait, decreased activity tolerance, decreased balance, decreased coordination, decreased mobility, difficulty walking, decreased strength, improper body mechanics, and postural dysfunction.    PLAN:  PT FREQUENCY: 1x/week  PT DURATION: 6 weeks  PLANNED INTERVENTIONS: 97750- Physical Performance Testing, 97110-Therapeutic exercises, 97530- Therapeutic activity, [id number]- Neuromuscular re-education, 97535- Self Care, [id number]- Manual therapy, [id number]- Gait training, and [phone]- Aquatic Therapy  PLAN FOR NEXT SESSION: D/C assessment/summary  10:15 AM, 07/20/24 M. Kelly Adisa Vigeant, PT, DPT Physical Therapist- [place] Office Number: [phone]

## 2024-07-20 NOTE — Therapy (Signed)
 OUTPATIENT OCCUPATIONAL THERAPY PARKIN[facility]S  Treatment Note  Patient Name: [name] MRN: [id number] DOB:1935-11-29, 88 y.o., male Today's Date: 07/20/2024  PCP: Yolande Toribio MATSU, MD REFERRING PROVIDER: Cary No, NP  END OF SESSION:  OT End of Session - 07/20/24 1120     Visit Number 9    Number of Visits 10    Date for Recertification  07/30/24    Authorization Type Medicare A &B    OT Start Time 1020    OT Stop Time 1102    OT Time Calculation (min) 42 min                  Past Medical History:  Diagnosis Date   Ankylosing spondylitis (HCC) dx'd ~ 1974   Arthritis    back (08/10/2015)   BENIGN PROSTATIC HYPERTROPHY, WITH OBSTRUCTION 01/15/2010   Bleeding duodenal ulcer    Bleeding esophageal ulcer    Bleeding stomach ulcer    GERD 02/22/2008   History of blood transfusion 08/26/1986   lost ~ 1/2 of my blood volume from multiple bleeding ulcers   History of hiatal hernia    HYPERGLYCEMIA 11/18/2007   HYPERLIPIDEMIA 02/22/2008   HYPERTENSION, UNSPECIFIED 11/10/2009   Kidney stones    Osteoarthritis    c-spine   Parkinson's disease (HCC)    PEPTIC ULCER DISEASE 11/17/2007   Situational depression    son died in MVA 06/03/15   TIA (transient ischemic attack)    not that I know of in the past; they are trying to determine if I've had one today (08/10/2015)   Past Surgical History:  Procedure Laterality Date   CATARACT EXTRACTION W/ INTRAOCULAR LENS  IMPLANT, BILATERAL Bilateral 2013   Patient Active Problem List   Diagnosis Date Noted   Ankylosing spondylitis of cervicothoracic region (HCC) 10/16/2016   TIA (transient ischemic attack) 08/10/2015   Carotid stenosis 08/04/2014   Hyperlipidemia 02/21/2012   BENIGN PROSTATIC HYPERTROPHY, WITH OBSTRUCTION 01/15/2010   Essential hypertension 11/10/2009   GERD 02/22/2008   NEPHROLITHIASIS, HX OF 11/17/2007    ONSET DATE: referral date 05/07/24  REFERRING DIAG: G20.A2 (ICD-10-CM) -  Parkinson's disease with fluctuating manifestations, unspecified whether dyskinesia present  THERAPY DIAG:  Other symptoms and signs involving the nervous system  Muscle weakness (generalized)  Abnormal posture  Rationale for Evaluation and Treatment: Rehabilitation  SUBJECTIVE:   SUBJECTIVE STATEMENT: Pt reporting no falls but slight imbalances.  Pt's spouse reporting that pt was able to don his pajama pants without assistance yesterday.   Pt accompanied by: self and significant other (spouse, [name])  PERTINENT HISTORY: history of Parkinson's disease, duodenal and esophageal ulcers, ankylosing spondylitis, hypertension and hyperlipidemia   PRECAUTIONS: Fall  WEIGHT BEARING RESTRICTIONS: No  PAIN:  Are you having pain? No  FALLS: Has patient fallen in last 6 months? Yes. Number of falls : falling more than once a week, most often in the bathroom or when attempting to walk without walker  LIVING ENVIRONMENT: Lives with: lives with their spouse Lives in: House/apartment Stairs: Yes: External: 2-3 steps; has a ramp at entrance with raised railings Internal: steps to basement and steps to loft - however pt does not need to go up/down those at this time. Has following equipment at home: [name] - 2 wheeled, Environmental Consultant - 4 wheeled, U-step, bed side commode, Shower chair, Ramped entry, and transport chair; 21 commode and arm rests that are attached to toilet seat, doorways have been widened  PLOF: Requires assistive device for independence and Needs  assistance with ADLs; [place]  PATIENT GOALS: safety with bathroom transfers and strategies for dressing tasks  OBJECTIVE:  Note: Objective measures were completed at Evaluation unless otherwise noted.  HAND DOMINANCE: Right  ADLs: Overall ADLs: fluctuates drastically; can take up to 2 hours for bathing and dressing Transfers/ambulation related to ADLs: Utilizing U-step for mobility in and outside  of the home  Eating: he's doing well with the eating manageable UB Dressing: occasional assistance, button up shirt is easier than pullover LB Dressing: getting dressed on a closed commode seat Toileting: Pt will occasionally require assistance to get up from toilet, has had falls attempting to transfer both to and from toilet Bathing: sponge bathing at sink without assistance from shower chair seated at sink, spouse assisting with washing hair; Setup for washing feet  Tub Shower transfers: not currently performing Equipment: Shower seat with back and Walk in shower with 5.5 ledge with 30 door  IADLs: Light housekeeping: washes dishes when he is feeling strong, however frequent onset of weakness Meal Prep: Pt was primary cook prior to fall in 2023, but has returned to participation in aspects of meal prep Community mobility: recently renewed drivers license, but is not driving  MOBILITY STATUS: Needs Assist: CGA with mobility with RW vs U-step and Hx of falls   POSTURE COMMENTS:  rounded shoulders and forward head, posterior pelvic tilt and R lean    FUNCTIONAL OUTCOME MEASURES:    UE ROM:  all movements are slowed, decreased shoulder flexion d/t ankylosing spondylitis   UE MMT:   grossly 3+ to 4/5 overall  COGNITION: Overall cognitive status: spouse reports thinking continues to be slower  OBSERVATIONS: Bradykinesia                                                                                                                    TREATMENT DATE:  07/20/24 5x sit > stand: completed in 14.0 seconds with UE support on U-step to steady self when coming upright.  Pt with decreased stability during 5th stand but able to safely lower self back to seated position without falling.  Discussed benefits of lift chair vs use of w/c cushion or quilt in chair to increase height of chair.  Spouse reporting that MD recommended that pt may benefit from lift chair.  OT advised on typical  concerns of becoming reliant on lift chair, however may benefit from increased height seat.  OT recommending attempting use of w/c cushion/stadium cushion or folded quilt to raise heat of seat to aid in ease with sit > stand from lower recliner.  OT also reiterating scooting forward to allow for increased ease and weight shift for sit > stand. Simulated LB dressing: engaged in reach forward to touch opposite shin with UE for forward reach and trunk rotation, completing x5 bilaterally.  Engaged in seated side stepping out to side and back to middle.  OT providing visual targets on the floor and demonstration to increase amplitude and sequencing.  Pt demonstrating mild improvement in space between feet when returning to midline compared to previous session. Trunk rotation: engaged in passing ball around back to facilitate UB rotation to aid in bathing/dressing.  Benefiting from max cues due to decreased sequencing.     07/13/24 OT educating pt and spouse on website - [email or web address] - that may aid in problem solving safety in home and decrease fall risk for pt.  This website was created by an OT to aid in prolonging independent living, and maximize the health of homes. Jacket: pt demonstrating good technique when doffing jacket at beginning of session with ability to advance jacket off his shoulders and then completely pull off arms while standing with close supervision. LB dressing: pt still with decreased ease with lifting feet, especially while reaching forwards.  Pt continues to be fearful of falling backwards in sitting on edge of bed or couch.  OT reiterated recommendation to trial sitting in shower chair against the wall to see if that provides adequate height and back and side support.   Large amplitude: engaged in seated side stepping out to side and back to middle.  OT providing visual targets on the floor and demonstration to increase amplitude and sequencing.  Pt continues to  demonstrate deceased space between feet when returning to midline. AE: OT educated on adaptive equipment to aid in meal prep with jar openers and adaptive cutting board to aid both pt and spouse as pt was primary cook but does not have the balance or stamina to continue to cook at the capacity that he once was.   07/08/24 Engaged in seated exercises at edge of mat with focus on breaking down LB dressing into hip flexion and external rotation. Engaged in massed practice of hip flexion with seated marching, followed by attempts at hip abduction to bring leg to edge of mat.  Pt able to achieve on R but not on L side.  Transitioned to attempting to flex left hip to raise leg while reaching towards shin with R hand to address trunk rotation.  Pt with decreased ability to complete, therefore transitioned to isolated trunk rotation while holding ball in B hands.  OT providing cues for pt to turn head to look at target to facilitate increase UB rotation.  Utilized scarf to simulate LB dressing with RLE in partial hip external rotation.  Pt still requiring significant amount of time and encouragement for each movement and to carry over to functional tasks. Self-care: engaged in discussion about [employer] Slip-ins as an option to aid in donning shoes and eliminating need to tie shoes.  Spouse to look further into this option.      PATIENT EDUCATION: Education details: HEP for LB and trunk mobility and to aid in UB and LB dressing; strategies to increase seat height of recliner to aid in ease with sit > stand Person educated: Patient and Spouse Education method: Explanation, Demonstration, Verbal cues, and Handouts Education comprehension: verbalized understanding and needs further education  HOME EXERCISE PROGRAM: Access Code: [email or web address] URL: [email or web address] Date: 07/08/2024 Prepared by: [facility] - Outpatient  Rehab - [facility]  Exercises - Seated Trunk Rotation  - 2 x daily -  7 x weekly - 3 sets - 10 reps - Seated March  - 2 x daily - 7 x weekly - 3 sets - 10 reps - Seated Hip Flexion and External Rotation  - 2 x daily - 7 x weekly - 3 sets - 10 reps  GOALS: Goals reviewed with  patient? Yes  SHORT TERM GOALS: Target date: 07/02/24  Pt and spouse will be independent in full body HEP with focus on large amplitude movements as needed to maintain/preserve function as pertaining to ADLs/IADLs.  Baseline:  Goal status: in progress  2.  Pt will demonstrate ability to complete sequencing of approach to transfer and sit > stand as needed to get up from toilet or other seating options in home by completing x3 throughout session without cues or physical assistance to maintain/preserve function.  Baseline:  Goal status: in progress  3.  Pt and spouse will report understanding of DME vs adaptive techniques/strategies and/or modifications to routines to increase safety with mobility and self-care tasks in the home.  Baseline:  Goal status: in progress    LONG TERM GOALS: Target date: 07/30/24  Pt will demonstrate improved ease and independence with sit > stand as evidenced by completing 5x sit > stand without posterior LOB and improved time by 5 seconds. Baseline: 38.53 sec from 21.5 seat on 06/21/24 Goal status: in progress  2.  Pt will demonstrate and/or report ability to complete UB dressing (both button up shirt and pull over sweatshirt) with min assist or better with use of AE and/or alternative strategies PRN.  Baseline: pt reporting 5-7 on PSFS with UB dressing Goal status: in progress  3.  Pt will demonstrate and/or report ability to complete LB dressing (donning pants) with min assist or better with use of AE and/or alternative strategies PRN.  Baseline: pt reporting 5 on PSFS Goal status: in progress  4.  Pt will demonstrate and/or report ability to complete LB dressing (donning shoes) with mod assist or better with use of AE and/or alternative strategies  PRN.  Baseline: max-total assist for tie shoes and min assist to supervision for slip on - but prefers tie shoes for safety with walking Goal status: in progress  5.  Patient will report at least two-point increase in average PSFS score or at least three-point increase in a single activity score indicating functionally significant improvement given minimum detectable change. Baseline: 4.6 Goal status: in progress   ASSESSMENT:  CLINICAL IMPRESSION: Patient is a 88 y.o. male who was seen today for occupational therapy treatment for advancements in Parkinson's disease, increased falls, and decreased independence with ADLs/IADLs.  Pt still requiring significant amount of time and encouragement for each movement, benefiting from demonstration, verbal cues, and tactile cues for seated stepping activity and trunk rotation.  Pt and spouse reporting some carryover of recommendations and education aiding in ease with ADLs.  Pt limited in trunk mobility d/t ankylosing spondylitis, however willing to continue to engage in simple trunk and hip movements to see if will aid in ADLs.    PERFORMANCE DEFICITS: in functional skills including ADLs, IADLs, ROM, strength, flexibility, mobility, balance, body mechanics, decreased knowledge of precautions, decreased knowledge of use of DME, and UE functional use and psychosocial skills including coping strategies, environmental adaptation, and routines and behaviors.   IMPAIRMENTS: are limiting patient from ADLs and IADLs.     PLAN:  OT FREQUENCY: 2x/week for 3 weeks, then 1x/week for remaining 3 weeks  OT DURATION: 6 weeks (asking 8 weeks to accommodate scheduling)  PLANNED INTERVENTIONS: 02831 OT Re-evaluation, 97535 self care/ADL training, 02889 therapeutic exercise, 97530 therapeutic activity, 97112 neuromuscular re-education, 97140 manual therapy, 97035 ultrasound, 97032 electrical stimulation (manual), functional mobility training, psychosocial skills  training, energy conservation, coping strategies training, patient/family education, and DME and/or AE instructions  RECOMMENDED OTHER SERVICES: NA  CONSULTED AND AGREED WITH PLAN OF CARE: Patient and family member/caregiver  PLAN FOR NEXT SESSION: review goals and d/c. Review home modifications - did cushion in recliner help? Any improvement in ADLs with exercises for trunk rotation and reach.    KAYLENE DOMINO, OTR/L 07/20/2024, 11:20 AM  [facility] at [facility] [address] [place], [address] [place], [place] [address] Phone # [phone] Fax # [phone]

## 2024-07-21 DIAGNOSIS — Z23 Encounter for immunization: Secondary | ICD-10-CM | POA: Diagnosis not present

## 2024-07-27 ENCOUNTER — Ambulatory Visit: Admitting: Occupational Therapy

## 2024-07-27 ENCOUNTER — Ambulatory Visit

## 2024-07-27 DIAGNOSIS — G20A2 Parkinson's disease without dyskinesia, with fluctuations: Secondary | ICD-10-CM | POA: Diagnosis present

## 2024-07-27 DIAGNOSIS — R2681 Unsteadiness on feet: Secondary | ICD-10-CM | POA: Insufficient documentation

## 2024-07-27 DIAGNOSIS — R262 Difficulty in walking, not elsewhere classified: Secondary | ICD-10-CM | POA: Diagnosis present

## 2024-07-27 DIAGNOSIS — R2689 Other abnormalities of gait and mobility: Secondary | ICD-10-CM

## 2024-07-27 DIAGNOSIS — R29818 Other symptoms and signs involving the nervous system: Secondary | ICD-10-CM | POA: Diagnosis present

## 2024-07-27 DIAGNOSIS — M6281 Muscle weakness (generalized): Secondary | ICD-10-CM | POA: Insufficient documentation

## 2024-07-27 DIAGNOSIS — R293 Abnormal posture: Secondary | ICD-10-CM

## 2024-07-27 DIAGNOSIS — R278 Other lack of coordination: Secondary | ICD-10-CM | POA: Insufficient documentation

## 2024-07-27 NOTE — Therapy (Signed)
 OUTPATIENT PHYSICAL THERAPY NEURO TREATMENT, Recertification, and D/C Summary  Patient Name: [name] MRN: [id number] DOB:1936-03-27, 88 y.o., male Today's Date: 07/27/2024   PCP: Yolande Toribio MATSU, MD REFERRING PROVIDER: Buck Saucer, MD  PHYSICAL THERAPY DISCHARGE SUMMARY  Visits from Start of Care: 23  Current functional level related to goals / functional outcomes: See below for outcome measures.  SBA-min A for sit to stand transfers. CGA-min A for ambulation   Remaining deficits: Balance deficits, high risk for falls, bradykinesia, freezing of gait   Education / Equipment: HEP   Patient agrees to discharge. Patient goals were partially met. Patient is being discharged due to being pleased with the current functional level.     END OF SESSION:  PT End of Session - 07/27/24 1016     Visit Number 23    Number of Visits 23    Date for Recertification  07/27/24    Authorization Type Medicare/[employer]    Progress Note Due on Visit 27    PT Start Time 1100    PT Stop Time 1145    PT Time Calculation (min) 45 min    Equipment Utilized During Treatment Gait belt    Activity Tolerance Patient tolerated treatment well    Behavior During Therapy WFL for tasks assessed/performed              Past Medical History:  Diagnosis Date   Ankylosing spondylitis (HCC) dx'd ~ 1974   Arthritis    back (08/10/2015)   BENIGN PROSTATIC HYPERTROPHY, WITH OBSTRUCTION 01/15/2010   Bleeding duodenal ulcer    Bleeding esophageal ulcer    Bleeding stomach ulcer    GERD 02/22/2008   History of blood transfusion 08/26/1986   lost ~ 1/2 of my blood volume from multiple bleeding ulcers   History of hiatal hernia    HYPERGLYCEMIA 11/18/2007   HYPERLIPIDEMIA 02/22/2008   HYPERTENSION, UNSPECIFIED 11/10/2009   Kidney stones    Osteoarthritis    c-spine   Parkinson's disease (HCC)    PEPTIC ULCER DISEASE 11/17/2007   Situational depression    son died in MVA 06-15-2015   TIA  (transient ischemic attack)    not that I know of in the past; they are trying to determine if I've had one today (08/10/2015)   Past Surgical History:  Procedure Laterality Date   CATARACT EXTRACTION W/ INTRAOCULAR LENS  IMPLANT, BILATERAL Bilateral 2013   Patient Active Problem List   Diagnosis Date Noted   Ankylosing spondylitis of cervicothoracic region (HCC) 10/16/2016   TIA (transient ischemic attack) 08/10/2015   Carotid stenosis 08/04/2014   Hyperlipidemia 02/21/2012   BENIGN PROSTATIC HYPERTROPHY, WITH OBSTRUCTION 01/15/2010   Essential hypertension 11/10/2009   GERD 02/22/2008   NEPHROLITHIASIS, HX OF 11/17/2007    ONSET DATE: PD x years onset  REFERRING DIAG: R26.81 (ICD-10-CM) - Unsteadiness on feet R29.3 (ICD-10-CM) - Abnormal posture R27.8 (ICD-10-CM) - Other lack of coordination G20.A2 (ICD-10-CM) - Parkinson's disease with fluctuating manifestations, unspecified whether dyskinesia present (HCC)  THERAPY DIAG:  Muscle weakness (generalized)  Abnormal posture  Unsteadiness on feet  Other abnormalities of gait and mobility  Parkinson's disease with fluctuating manifestations, unspecified whether dyskinesia present (HCC)  Difficulty in walking, not elsewhere classified  Other symptoms and signs involving the nervous system  Rationale for Evaluation and Treatment: Rehabilitation  SUBJECTIVE:  SUBJECTIVE STATEMENT: Have had additional fall at home when exiting bathroom. Have not yet return to local gym for exercise/cycling Pt accompanied by: significant other  PERTINENT HISTORY: complex medical history of peptic ulcer disease with history of upper GI bleed, TIA, depression, degenerative disc disease in the neck, osteoarthritis, kidney stones, hyperlipidemia, hypertension,  history of hiatal hernia, BPH, arthritis, and ankylosing spondylitis, and parkinsonism  PAIN:  Are you having pain? Not really  PRECAUTIONS: Fall  RED FLAGS: None   WEIGHT BEARING RESTRICTIONS: No  FALLS: Has patient fallen in last 3 months? Unsure as to number, notes fall/LOB every other week  LIVING ENVIRONMENT: Lives with: lives with their spouse Lives in: House/apartment Stairs: Ramp to enter, stairs to loft and basement, ground floor set-up Has following equipment at home: [name] - 2 wheeled  PLOF: Needs assistance with ADLs, Needs assistance with homemaking, and Needs assistance with gait  PATIENT GOALS:   OBJECTIVE:   TODAY'S TREATMENT: 07/27/24 Activity Comments  Berg Balance Test 34/56  Review of FoG strategies   AD adjustment and demo                [facility] PT Assessment - 07/27/24 0001       Berg Balance Test   Sit to Stand Able to stand  independently using hands    Standing Unsupported Able to stand safely 2 minutes    Sitting with Back Unsupported but Feet Supported on Floor or Stool Able to sit safely and securely 2 minutes    Stand to Sit Controls descent by using hands    Transfers Able to transfer safely, definite need of hands    Standing Unsupported with Eyes Closed Able to stand 10 seconds with supervision    Standing Unsupported with Feet Together Able to place feet together independently and stand for 1 minute with supervision    From Standing, Reach Forward with Outstretched Arm Can reach forward >5 cm safely (2)    From Standing Position, Pick up Object from Floor Able to pick up shoe, needs supervision    From Standing Position, Turn to Look Behind Over each Shoulder Turn sideways only but maintains balance    Turn 360 Degrees Needs close supervision or verbal cueing    Standing Unsupported, Alternately Place Feet on Step/Stool Able to complete >2 steps/needs minimal assist    Standing Unsupported, One Foot in Front Needs help to step but  can hold 15 seconds    Standing on One Leg Tries to lift leg/unable to hold 3 seconds but remains standing independently    Total Score 34           PATIENT EDUCATION: Education details: discussed using U step when trying to carry items rather than going without AD Person educated: Patient and Spouse Education method: Explanation Education comprehension: verbalized understanding       Note: Objective measures were completed at Evaluation unless otherwise noted.  DIAGNOSTIC FINDINGS:   COGNITION: Overall cognitive status: Within functional limits for tasks assessed and some instances of difficulty attending to task/sequencing   SENSATION: Not tested  COORDINATION: Deficits to rapid alternating movements    MUSCLE TONE: rigidity  MUSCLE LENGTH: 10-15 degree knee flexion contracture bilat   POSTURE: rounded shoulders, forward head, and increased thoracic kyphosis  LOWER EXTREMITY ROM:     Right ankle ROM limits from fusion  LOWER EXTREMITY MMT:    4/5 gross BLE  BED MOBILITY:  Findings: NT, reports fluctuating performance  TRANSFERS: Sit to stand: Modified  independence and CGA  Assistive device utilized: U-Step     Stand to sit: CGA  Assistive device utilized: u-step     Chair to chair: Modified independence and CGA  Assistive device utilized: U-step         CURB:  Findings: CGA-min A  STAIRS: Findings: Comments: CGA-min A GAIT: Findings: Distance walked: 150, Assistive device utilized:U-step, Level of assistance: CGA, and Comments: freezing in turns  FUNCTIONAL TESTS:  5 times sit to stand: 47.25 sec Timed up and go (TUG): 36 sec w/ U-step 10 meter walk test: 23.34 sec = 1.4 ft/sec w/ U-step Lars Balance Scale:  180 degree turns: 16-18 steps  Item Test date: 01/20/24 Test date: 06/01/23 Test date:   Sitting to standing 2. able to stand using hands after several tries 2. able to stand using hands after several tries  3. able to stand 2 minutes  with supervision  4. able to sit safely and securely for 2 minutes   2. uses back of legs against chair to control descent  2. able to transfer with verbal cuing and/or supervision  3. able to stand 10 seconds with supervision  3. able to place feet together independently and stand 1 minute with supervision  3. can reach forward 12 cm (5 inches)    3. able to pick up slipper but needs supervision  1. needs supervision when turning    1. needs close supervision or verbal cuing  1. able to complete > 2 steps needs minimal assist    0. loses balance while stepping or standing   0. unable to try of needs assist to prevent fall       Insert OPRCBERGREEVAL SmartPhrase at re-test date  2. Standing unsupported 3. able to stand 2 minutes with supervision    3. Sitting with back unsupported, feet supported 4. able to sit safely and securely for 2 minutes    4. Standing to sitting 2. uses back of legs against chair to control descent    5. Pivot transfer  2. able to transfer with verbal cuing and/or supervision    6. Standing unsupported with eyes closed 3. able to stand 10 seconds with supervision    7. Standing unsupported with feet together 3. able to place feet together independently and stand 1 minute with supervision    8. Reaching forward with outstretched arms while standing 3. can reach forward 12 cm (5 inches)    9. Pick up object from the floor from standing 0. unable to try/needs assistance to keep from losing balance or falling    10. Turning to look behind over left and right shoulders while standing 2. turns sideways but only maintains balance    11. Turn 360 degrees 0. needs assistance while turning    12. Place alternate foot on step or stool while standing unsupported 1. able to complete > 2 steps needs minimal assist    13. Standing unsupported one foot in front 0. loses balance while stepping or standing    14. Standing on one leg 0. unable to try of needs assist to  prevent fall      Total Score 25/56 Total Score  28 /56 Total Score /56     PATIENT SURVEYS:  Freezing of gait questionnaire provided to fill out and return  TREATMENT DATE:     PATIENT EDUCATION:a Education details: assessment details, rationale of tx interventions Person educated: Patient and Spouse Education method: Explanation Education comprehension: verbalized understanding  HOME EXERCISE PROGRAM: TBD  GOALS: Goals reviewed with patient? Yes  SHORT TERM GOALS: Target date: 06/21/2024      Patient will perform HEP with family/caregiver supervision for improved strength, balance, transfers, and gait  Baseline: Goal status: MET  2.  Demo improved postural control and BLE strength per time 35 sec 5xSTS test Baseline: 47 sec; 37 sec; 38 sec Goal status: NOT MET    LONG TERM GOALS: Target date: 07/26/2024      Maintain score 28-30/56 Berg Balance Test for safety during ADL Baseline: 25/56; 27/56; 29/56; 28/56; 34/56 Goal status: MET  2.  Demo improved safety/efficiency of turning negotiating 180 degree turn in 10-12 steps Baseline: 16-18 w/ U-step; 20-22 w/ U-step; 12-18 steps given min A for RW negotiation 04/13/24; 20+ steps and min A to prevent posterior LOB Goal status: NOT MET  3.  Demo improved safety with mobility per time 25 sec TUG test Baseline: 36 sec w/ U-step; 1 min 30 sec w/ U-step; 1 min 10 sec with RW and verbal cueing for safe turning/navigation; 47 sec w/ U-step; 60 sec w/ U-step Goal status: NOT MET  4.  Spouse/patient to teach-back strategies to help with reducing freezing of gait to improve safety with turning to sit w/ U-step Baseline:  Goal status: MET   ASSESSMENT:  CLINICAL IMPRESSION: Demo improved Begr Balance Test score from previous to exceeding goal of 34/56 indicating high risk for falls with significant  improvement form baseline score 27.  Difficulty with freezing of gait remains and postural instability w/ retropulsion and varying level of assistance for functional mobility from SBA to min A for postural control.  Sessions with focus on caregiver training and discussing/practicing strategies for freezing of gait and overcoming retropulsion with best carryover from visual/tactile cues. Caregiver with good return demonstration of techiques.  Will D/C at this time and f/u as needed  OBJECTIVE IMPAIRMENTS: Abnormal gait, decreased activity tolerance, decreased balance, decreased coordination, decreased mobility, difficulty walking, decreased strength, improper body mechanics, and postural dysfunction.    PLAN:  PT FREQUENCY: 1x/week  PT DURATION: 6 weeks  PLANNED INTERVENTIONS: 97750- Physical Performance Testing, 97110-Therapeutic exercises, 97530- Therapeutic activity, [id number]- Neuromuscular re-education, 97535- Self Care, [id number]- Manual therapy, [phone]- Gait training, and [phone]- Aquatic Therapy  PLAN FOR NEXT SESSION: D/C assessment/summary  10:59 AM, 07/27/24 M. Kelly Pamela Maddy, PT, DPT Physical Therapist- [place] Office Number: [phone]

## 2024-07-27 NOTE — Therapy (Signed)
 OUTPATIENT OCCUPATIONAL THERAPY PARKINSON'S  Treatment Note & Discharge  Patient Name: [name] MRN: [id number] DOB:08/20/1936, 88 y.o., male Today's Date: 07/27/2024  PCP: Yolande Toribio MATSU, MD REFERRING PROVIDER: Cary No, NP  OCCUPATIONAL THERAPY DISCHARGE SUMMARY  Visits from Start of Care: 10  Current functional level related to goals / functional outcomes: Pt has met 3 of 5 LTGs.  Pt and spouse have been provided with multiple recommendations of alternative strategies and/or alternative items to aid in ease and independence with dressing tasks.  Pt's spouse to purchase new shoes and has modified recliner chair to aid in ease with sit > stand.  Pt has made some modifications to routine with bathroom transfers, however continues to have falls despite improved use of U-Step.     Remaining deficits: Increased assist with dressing, especially pull over style shirts and shoes.    Education / Equipment: HEP for trunk rotation and reaching to facilitate carryover to ADLs,  modifications to routine or setup with ADLs and transfers  Patient agrees to discharge. Patient goals were partially met. Patient is being discharged due to maximized rehab potential. .     END OF SESSION:  OT End of Session - 07/27/24 1439     Visit Number 10    Number of Visits 10    Date for Recertification  07/30/24    Authorization Type Medicare A &B    OT Start Time 1020    OT Stop Time 1100    OT Time Calculation (min) 40 min                   Past Medical History:  Diagnosis Date   Ankylosing spondylitis (HCC) dx'd ~ 1974   Arthritis    back (08/10/2015)   BENIGN PROSTATIC HYPERTROPHY, WITH OBSTRUCTION 01/15/2010   Bleeding duodenal ulcer    Bleeding esophageal ulcer    Bleeding stomach ulcer    GERD 02/22/2008   History of blood transfusion 08/26/1986   lost ~ 1/2 of my blood volume from multiple bleeding ulcers   History of hiatal hernia    HYPERGLYCEMIA 11/18/2007    HYPERLIPIDEMIA 02/22/2008   HYPERTENSION, UNSPECIFIED 11/10/2009   Kidney stones    Osteoarthritis    c-spine   Parkinson's disease (HCC)    PEPTIC ULCER DISEASE 11/17/2007   Situational depression    son died in MVA 06/26/2015   TIA (transient ischemic attack)    not that I know of in the past; they are trying to determine if I've had one today (08/10/2015)   Past Surgical History:  Procedure Laterality Date   CATARACT EXTRACTION W/ INTRAOCULAR LENS  IMPLANT, BILATERAL Bilateral 2013   Patient Active Problem List   Diagnosis Date Noted   Ankylosing spondylitis of cervicothoracic region (HCC) 10/16/2016   TIA (transient ischemic attack) 08/10/2015   Carotid stenosis 08/04/2014   Hyperlipidemia 02/21/2012   BENIGN PROSTATIC HYPERTROPHY, WITH OBSTRUCTION 01/15/2010   Essential hypertension 11/10/2009   GERD 02/22/2008   NEPHROLITHIASIS, HX OF 11/17/2007    ONSET DATE: referral date 05/07/24  REFERRING DIAG: G20.A2 (ICD-10-CM) - Parkinson's disease with fluctuating manifestations, unspecified whether dyskinesia present  THERAPY DIAG:  Other symptoms and signs involving the nervous system  Other lack of coordination  Abnormal posture  Muscle weakness (generalized)  Rationale for Evaluation and Treatment: Rehabilitation  SUBJECTIVE:   SUBJECTIVE STATEMENT: Pt reporting that he fell when trying to walk out of the bathroom.  Reporting that he got caught on one of the  rails on the side on his foot, causing him to fall forward and hit his head on the walker.    Spouse reports that MD prescribed him a muscle relaxer that he has not yet started.    Pt accompanied by: self and significant other (spouse, [name])  PERTINENT HISTORY: history of Parkinson's disease, duodenal and esophageal ulcers, ankylosing spondylitis, hypertension and hyperlipidemia   PRECAUTIONS: Fall  WEIGHT BEARING RESTRICTIONS: No  PAIN:  Are you having pain? No  FALLS: Has patient fallen in  last 6 months? Yes. Number of falls : falling more than once a week, most often in the bathroom or when attempting to walk without walker  LIVING ENVIRONMENT: Lives with: lives with their spouse Lives in: House/apartment Stairs: Yes: External: 2-3 steps; has a ramp at entrance with raised railings Internal: steps to basement and steps to loft - however pt does not need to go up/down those at this time. Has following equipment at home: [name] - 2 wheeled, Environmental Consultant - 4 wheeled, U-step, bed side commode, Shower chair, Ramped entry, and transport chair; 21 commode and arm rests that are attached to toilet seat, doorways have been widened  PLOF: Requires assistive device for independence and Needs assistance with ADLs; [place]  PATIENT GOALS: safety with bathroom transfers and strategies for dressing tasks  OBJECTIVE:  Note: Objective measures were completed at Evaluation unless otherwise noted.  HAND DOMINANCE: Right  ADLs: Overall ADLs: fluctuates drastically; can take up to 2 hours for bathing and dressing Transfers/ambulation related to ADLs: Utilizing U-step for mobility in and outside of the home  Eating: he's doing well with the eating manageable UB Dressing: occasional assistance, button up shirt is easier than pullover LB Dressing: getting dressed on a closed commode seat Toileting: Pt will occasionally require assistance to get up from toilet, has had falls attempting to transfer both to and from toilet Bathing: sponge bathing at sink without assistance from shower chair seated at sink, spouse assisting with washing hair; Setup for washing feet  Tub Shower transfers: not currently performing Equipment: Shower seat with back and Walk in shower with 5.5 ledge with 30 door  IADLs: Light housekeeping: washes dishes when he is feeling strong, however frequent onset of weakness Meal Prep: Pt was primary cook prior to fall in 2023, but has returned  to participation in aspects of meal prep Community mobility: recently renewed drivers license, but is not driving  MOBILITY STATUS: Needs Assist: CGA with mobility with RW vs U-step and Hx of falls   POSTURE COMMENTS:  rounded shoulders and forward head, posterior pelvic tilt and R lean    FUNCTIONAL OUTCOME MEASURES:    07/27/24: PSFS: 6.0   UE ROM:  all movements are slowed, decreased shoulder flexion d/t ankylosing spondylitis   UE MMT:   grossly 3+ to 4/5 overall  COGNITION: Overall cognitive status: spouse reports thinking continues to be slower  OBSERVATIONS: Bradykinesia  TREATMENT DATE:  07/27/24 Mobility: OT providing mod cues for step length when walking, especially when turning.  Pt demonstrating increased shuffling when turning and even with min LOB to R when turning.  Pt and spouse report that pt is still having falls, but they seem to be less dramatic. Spouse reporting that he is more able to aid in transfers from floor after falling.   Sit>stand: 21.91 sec, however with tendency to pull up on U-step walker this session Reviewed modifications to setup and routine with U-step positioning during transfers and even when standing in bathroom, positioning to increase ease with reaching towards feet during LB dressing, alternative clothing and/or shoes to aid in dressing, cushion for sit > stand from recliner.  Pt's spouse to purchase [employer]chers slip-ins and to continue to modify recliner seat for increased ease with sit > stand.    07/20/24 5x sit > stand: completed in 14.0 seconds with UE support on U-step to steady self when coming upright.  Pt with decreased stability during 5th stand but able to safely lower self back to seated position without falling.  Discussed benefits of lift chair vs use of w/c cushion or quilt in chair to increase height of chair.   Spouse reporting that MD recommended that pt may benefit from lift chair.  OT advised on typical concerns of becoming reliant on lift chair, however may benefit from increased height seat.  OT recommending attempting use of w/c cushion/stadium cushion or folded quilt to raise heat of seat to aid in ease with sit > stand from lower recliner.  OT also reiterating scooting forward to allow for increased ease and weight shift for sit > stand. Simulated LB dressing: engaged in reach forward to touch opposite shin with UE for forward reach and trunk rotation, completing x5 bilaterally.  Engaged in seated side stepping out to side and back to middle.  OT providing visual targets on the floor and demonstration to increase amplitude and sequencing.  Pt demonstrating mild improvement in space between feet when returning to midline compared to previous session. Trunk rotation: engaged in passing ball around back to facilitate UB rotation to aid in bathing/dressing.  Benefiting from max cues due to decreased sequencing.     07/13/24 OT educating pt and spouse on website - [email or web address] - that may aid in problem solving safety in home and decrease fall risk for pt.  This website was created by an OT to aid in prolonging independent living, and maximize the health of homes. Jacket: pt demonstrating good technique when doffing jacket at beginning of session with ability to advance jacket off his shoulders and then completely pull off arms while standing with close supervision. LB dressing: pt still with decreased ease with lifting feet, especially while reaching forwards.  Pt continues to be fearful of falling backwards in sitting on edge of bed or couch.  OT reiterated recommendation to trial sitting in shower chair against the wall to see if that provides adequate height and back and side support.   Large amplitude: engaged in seated side stepping out to side and back to middle.  OT providing visual  targets on the floor and demonstration to increase amplitude and sequencing.  Pt continues to demonstrate deceased space between feet when returning to midline. AE: OT educated on adaptive equipment to aid in meal prep with jar openers and adaptive cutting board to aid both pt and spouse as pt was primary cook but does not have the balance or stamina to  continue to cook at the capacity that he once was.   PATIENT EDUCATION: Education details: review of modifications to setup and/or routine with sit > stand, U-step positioning, and ADLs Person educated: Patient and Spouse Education method: Explanation, Demonstration, Verbal cues, and Handouts Education comprehension: verbalized understanding and needs further education  HOME EXERCISE PROGRAM: Access Code: [email or web address] URL: [email or web address] Date: 07/08/2024 Prepared by: [facility] - Outpatient  Rehab - [facility]  Exercises - Seated Trunk Rotation  - 2 x daily - 7 x weekly - 3 sets - 10 reps - Seated March  - 2 x daily - 7 x weekly - 3 sets - 10 reps - Seated Hip Flexion and External Rotation  - 2 x daily - 7 x weekly - 3 sets - 10 reps  GOALS: Goals reviewed with patient? Yes  SHORT TERM GOALS: Target date: 07/02/24  Pt and spouse will be independent in full body HEP with focus on large amplitude movements as needed to maintain/preserve function as pertaining to ADLs/IADLs.  Baseline:  Goal status: in progress  2.  Pt will demonstrate ability to complete sequencing of approach to transfer and sit > stand as needed to get up from toilet or other seating options in home by completing x3 throughout session without cues or physical assistance to maintain/preserve function.  Baseline:  Goal status: in progress  3.  Pt and spouse will report understanding of DME vs adaptive techniques/strategies and/or modifications to routines to increase safety with mobility and self-care tasks in the home.  Baseline:  Goal status:  in progress    LONG TERM GOALS: Target date: 07/30/24  Pt will demonstrate improved ease and independence with sit > stand as evidenced by completing 5x sit > stand without posterior LOB and improved time by 5 seconds. Baseline: 38.53 sec from 21.5 seat on 06/21/24 07/27/24: 21.91 sec from 21 seat Goal status: MET  2.  Pt will demonstrate and/or report ability to complete UB dressing (both button up shirt and pull over sweatshirt) with min assist or better with use of AE and/or alternative strategies PRN.  Baseline: pt reporting 5-7 on PSFS with UB dressing 07/27/24: reporting 5 on pull over and 8 on button up shirt, pt continues to be very limited in shoulder ROM due to ankylosing spondylitis Goal status: NOT MET  3.  Pt will demonstrate and/or report ability to complete LB dressing (donning pants) with min assist or better with use of AE and/or alternative strategies PRN.  Baseline: pt reporting 5 on PSFS 07/27/24:  pt reporting use of modified circle sitting to increase ease with donning pants, instances of completing without assistance, reporting improved score to 7 on PSFS Goal status: MET  4.  Pt will demonstrate and/or report ability to complete LB dressing (donning shoes) with mod assist or better with use of AE and/or alternative strategies PRN.  Baseline: max-total assist for tie shoes and min assist to supervision for slip on - but prefers tie shoes for safety with walking 07/27/24: still requiring assistance with tie shoes, pt and spouse to look into [employer] Slip-ins for increased ease and fit of shoes but have yet to do so Goal status: NOT MET  5.  Patient will report at least two-point increase in average PSFS score or at least three-point increase in a single activity score indicating functionally significant improvement given minimum detectable change. Baseline: 4.6 07/27/24: 6.0 overall, however improved from 4 to 7 with toilet transfers Goal status: MET (3 pt increase in  single activity)   ASSESSMENT:  CLINICAL IMPRESSION: Patient is a 88 y.o. male who was seen today for occupational therapy treatment for advancements in Parkinson's disease, increased falls, and decreased independence with ADLs/IADLs.  Pt slower with mobility this session, could be carryover from fall early morning yesterday when exiting the bathroom.  Pt and spouse reporting some carryover of recommendations and education aiding in ease with ADLs, however still needing time to engage in exercises and to purchase clothing items (shoes) to aid in ease with dressing.  Pt limited in trunk mobility d/t ankylosing spondylitis, which greatly impacts his ease with UB dressing.  Pt and spouse pleased with current recommendations and assistance provided during this time of OT, plan to follow through with some recommendations and modifications, and engage in HEP at home. Pt and spouse in agreement with taking a break from therapy to initiate education and exercises as well as f/u on medication changes from MD.  Pt will benefit from return eval in ~3-4 months due to progressive nature of condition.    PERFORMANCE DEFICITS: in functional skills including ADLs, IADLs, ROM, strength, flexibility, mobility, balance, body mechanics, decreased knowledge of precautions, decreased knowledge of use of DME, and UE functional use and psychosocial skills including coping strategies, environmental adaptation, and routines and behaviors.   IMPAIRMENTS: are limiting patient from ADLs and IADLs.     PLAN:  OT FREQUENCY: 2x/week for 3 weeks, then 1x/week for remaining 3 weeks  OT DURATION: 6 weeks (asking 8 weeks to accommodate scheduling)  PLANNED INTERVENTIONS: 02831 OT Re-evaluation, 97535 self care/ADL training, 02889 therapeutic exercise, 97530 therapeutic activity, 97112 neuromuscular re-education, 97140 manual therapy, 97035 ultrasound, 97032 electrical stimulation (manual), functional mobility training, psychosocial  skills training, energy conservation, coping strategies training, patient/family education, and DME and/or AE instructions  RECOMMENDED OTHER SERVICES: NA  CONSULTED AND AGREED WITH PLAN OF CARE: Patient and family member/caregiver  PLAN FOR NEXT SESSION: return eval in 3-4 months    Sadao Weyer, OTR/L 07/27/2024, 2:39 PM  [facility] at [facility] [address] [place], [place] [address] Phone # [phone] Fax # [phone]

## 2024-08-05 DIAGNOSIS — M459 Ankylosing spondylitis of unspecified sites in spine: Secondary | ICD-10-CM | POA: Diagnosis not present

## 2024-08-31 ENCOUNTER — Other Ambulatory Visit: Payer: Self-pay | Admitting: Cardiology

## 2024-09-02 NOTE — Progress Notes (Unsigned)
 "   No chief complaint on file.   HISTORY OF PRESENT ILLNESS:  09/02/2024 ALL:  [name] returns for follow up for PD. He was last seen by Dr Buck 01/2024  10/20/2023 ALL:  [name] returns for follow up for PD. He was last seen by Dr Buck 03/2023 following hospitalization for recurrent falls, cognitive decline and hallucinations. He was referred to PT/OT and HH. Carb-levo 1 tablet five times daily continued. Since, he continues to have progressive symptoms of PD. He has had multiple falls since last visit. Last fall 09/10/2023 resulted in a head injury requiring suture in ER. He had been seen the day prior with another fall where he fractured a rib. He continues PT/OT. HH deferred. U Step walker seemed to help with stability. Order needed. Sialorrhea continues to be bothersome. He denies difficulty swallowing. Eating normally. He is sleeping more. He does continue to describe visual hallucinations as well as some delusional thoughts. He is bothered by these as he is afraid others will think he is crazy. He has not used CPAP recently for OSA due to head injury.   04/10/2023 SA:  He reports feeling weaker overall.  He has had falls.  He has had instances where he could not stop his movement as he fell backwards.  He tries to walk around in the house on a regular basis, sometimes he does not use his 2 wheeled walker.  He usually gets 4 doses of Sinemet  and on a regular day, sometimes all 5 doses.  He has had more drooling, he has occasional visual hallucinations but nothing alarming per se.    He presented to the emergency room on 03/03/2023 with recurrent falls as well as hallucinations.  I reviewed the emergency room records.  He had a head CT without contrast and cervical spine CT without contrast on 03/03/2023 and I reviewed the report: Impression: No evidence of acute intracranial or cervical spine injury.  He also had a CT of the chest, abdomen and pelvis with contrast on 03/03/2023 and I reviewed the  results:   IMPRESSION: 1. No evidence of acute traumatic injury to the chest, abdomen, or pelvis. 2. Mild inferior endplate compression fracture of T3 has a chronic appearance. 3.  Aortic Atherosclerosis (ICD10-I70.0).   He had a CT of the lumbar spine without contrast on 03/03/2023 and I reviewed the results: Impression: 1. No acute/traumatic lumbar spine pathology. 2. Bilateral L5 pars defects. No listhesis.    Laboratory testing showed mildly elevated BUN at 27 on chemistry profile, otherwise benign findings on CMP, CK level 112, urinalysis negative for UTI, CBC with differential showed below normal RBC at 3.7, hemoglobin below normal at 12.5, hematocrit below normal at 37.3, MCV borderline above normal at 100.5.  10/24/2022 ALL:  [name] is a 89 y.o. male here today for follow up for PD. He was last seen by Dr Buck 06/2022 and advised to continue carb/levo 1 tablet five times daily. He was encouraged to resume autoPAP. Since, he has continue to work with PT/OT twice daily. He feels that it is helping with strength. He does report multiple falls, the majority while getting PT. He feels that he is getting around home pretty well. He is using his walker most all times. He has not needed as much assistance with ADLs. He does admit to furniture surfing. He eats normally. He denies trouble swallowing foods or liquids but does continue to have drooling. He is scheduled to see speech therapy in 2 weeks.  He is sleeping well. Memory is stable. He will occasionally forget some details but has not seemed overly bothersome.   He reports trying to resume autoPAP therapy but has not been able to tolerate mask and headgear due to tenderness of scalp following skin biopsies. He is willing to try a different mask/headgear. He was previously with [facility] and needs new DME.   HISTORY (copied from Dr Obie previous note)  [name] is an 89 year old right-handed gentleman with an underlying complex  medical history of peptic ulcer disease with history of upper GI bleed, TIA, depression, degenerative disc disease in the neck, osteoarthritis, kidney stones, hyperlipidemia, hypertension, history of hiatal hernia, BPH, arthritis, and ankylosing spondylitis, and parkinsonism, who presents for follow-up of his OSA and parkinsonism. The patient is accompanied by his wife again today and presents for a sooner than scheduled appointment after a recent fall with injury.  I last saw him on 01/24/2022, at which time he was compliant with his AutoPap therapy, residual AHI was still elevated and he was still having trouble with mask seal.  He was using Sinemet  4 times a day.   Today, 07/03/2022: He reports feeling okay, reports feeling weak and having difficulty standing and walking at times, he has had trouble with nighttime urination.  He is currently sleeping in a recliner.  He has not been able to use his AutoPap because of the forehead laceration and healing scar which is bothered by the headgear.  His wife provides additional details of his history and reports that since his fall in mid October he has had mobility decline, needing more help with activities of daily living including dressing which was previously independent.  He uses a 2 wheeled walker but sometimes has trouble standing.  He has fallen in the past few weeks a few times, no serious injuries with the exception of the forehead laceration last month.  He had follow-up at the primary care for the stitches, they did not check him for his frequent urination but he did have an urgent care visit and was checked for urinary tract infection which was negative per wife.  She would like for him to see a urologist.  She would like for him to have PT and OT.   Of note, he presented to the emergency room on 06/06/2022 after a fall, he sustained a scalp 3 through the right side, needing stitches.  I reviewed the emergency room records from [name], ED department  from 06/06/2022.  He had a head CT without contrast and cervical spine CT without contrast on 06/06/2022 and I reviewed the results:   IMPRESSION: 1. No acute intracranial process. 2. No acute osseous injury in the cervical spine.   REVIEW OF SYSTEMS: Out of a complete 14 system review of symptoms, the patient complains only of the following symptoms, imbalance, difficulty controlling saliva, tremor, daytime sleepiness and all other reviewed systems are negative.   ALLERGIES: No Known Allergies   HOME MEDICATIONS: Outpatient Medications Prior to Visit  Medication Sig Dispense Refill   aspirin  81 MG tablet Take 81 mg by mouth daily.       atorvastatin  (LIPITOR) 10 MG tablet TAKE 1 TABLET BY MOUTH DAILY 90 tablet 2   carbidopa -levodopa  (SINEMET  IR) 25-100 MG tablet TAKE 1 TABLET BY MOUTH 5 TIMES  DAILY (AT 6 AM, 10 AM, 2 PM, 6  PM, 9 PM DAILY ) 450 tablet 3   cholecalciferol  (VITAMIN D3) 25 MCG (1000 UNIT) tablet Take 1,000 Units  by mouth daily.     levothyroxine  (SYNTHROID ) 50 MCG tablet Take 50 mcg by mouth daily before breakfast.     losartan -hydrochlorothiazide  (HYZAAR) 50-12.5 MG tablet TAKE ONE-HALF TABLET BY MOUTH  DAILY 45 tablet 3   Multiple Vitamin (MULTIVITAMIN WITH MINERALS) TABS tablet Take 1 tablet by mouth daily with breakfast.     MYRBETRIQ 50 MG TB24 tablet Take 50 mg by mouth daily.     pantoprazole  (PROTONIX ) 20 MG tablet Take 20 mg by mouth at bedtime.     Pimavanserin Tartrate  (NUPLAZID ) 10 MG TABS Take 10 mg by mouth daily. (Patient not taking: Reported on 11/03/2023) 90 tablet 3   PRESCRIPTION MEDICATION BiPAP- At bedtime     Secukinumab  (COSENTYX  [place]) Inject into the vein every 30 (thirty) days.     sulindac (CLINORIL) 150 MG tablet Take 150 mg by mouth at bedtime.     SYSTANE COMPLETE PF 0.6 % SOLN Place 1 drop into both eyes 4 (four) times daily.     No facility-administered medications prior to visit.     PAST MEDICAL HISTORY: Past Medical History:   Diagnosis Date   Ankylosing spondylitis (HCC) dx'd ~ 1974   Arthritis    back (08/10/2015)   BENIGN PROSTATIC HYPERTROPHY, WITH OBSTRUCTION 01/15/2010   Bleeding duodenal ulcer    Bleeding esophageal ulcer    Bleeding stomach ulcer    GERD 02/22/2008   History of blood transfusion 08/26/1986   lost ~ 1/2 of my blood volume from multiple bleeding ulcers   History of hiatal hernia    HYPERGLYCEMIA 11/18/2007   HYPERLIPIDEMIA 02/22/2008   HYPERTENSION, UNSPECIFIED 11/10/2009   Kidney stones    Osteoarthritis    c-spine   Parkinson's disease (HCC)    PEPTIC ULCER DISEASE 11/17/2007   Situational depression    son died in MVA 2015/06/29   TIA (transient ischemic attack)    not that I know of in the past; they are trying to determine if I've had one today (08/10/2015)     PAST SURGICAL HISTORY: Past Surgical History:  Procedure Laterality Date   CATARACT EXTRACTION W/ INTRAOCULAR LENS  IMPLANT, BILATERAL Bilateral 2013     FAMILY HISTORY: Family History  Problem Relation Age of Onset   Appendicitis Mother        complications   Colon cancer Father    Diabetes type II Father    Lupus Sister    Colon cancer Other        1st degree relative <60   Parkinson's disease Neg Hx      SOCIAL HISTORY: Social History   Socioeconomic History   Marital status: Married    Spouse name: Not on file   Number of children: Not on file   Years of education: Not on file   Highest education level: Not on file  Occupational History   Not on file  Tobacco Use   Smoking status: Former    Types: Cigars, Pipe    Quit date: 08/26/1978    Years since quitting: 46.0   Smokeless tobacco: Never  Vaping Use   Vaping status: Never Used  Substance and Sexual Activity   Alcohol  use: Never    Comment: 08/10/2015 glass of wine ~ month or so; occasionally a can of beer   Drug use: Never   Sexual activity: Not Currently    Partners: Female    Comment: married  Other Topics Concern    Not on file  Social History Narrative   Retired  and married.    Social Drivers of Health   Tobacco Use: Medium Risk (07/27/2024)   Patient History    Smoking Tobacco Use: Former    Smokeless Tobacco Use: Never    Passive Exposure: Not on file  Financial Resource Strain: Not on file  Food Insecurity: No Food Insecurity (05/30/2023)   Hunger Vital Sign    Worried About Running Out of Food in the Last Year: Never true    Ran Out of Food in the Last Year: Never true  Transportation Needs: No Transportation Needs (05/30/2023)   PRAPARE - Administrator, Civil Service (Medical): No    Lack of Transportation (Non-Medical): No  Physical Activity: Not on file  Stress: Not on file  Social Connections: Not on file  Intimate Partner Violence: Not on file  Depression (EYV7-0): Not on file  Alcohol  Screen: Not on file  Housing: Low Risk (05/30/2023)   Housing    Last Housing Risk Score: 0  Utilities: Not on file  Health Literacy: Not on file     PHYSICAL EXAM  There were no vitals filed for this visit.   There is no height or weight on file to calculate BMI.  Generalized: Well developed, in no acute distress, sialorrhea noted   Cardiology: normal rate and rhythm, no murmur auscultated  Respiratory: clear to auscultation bilaterally    Neurological examination  Mentation: Alert oriented to time, place, history taking. Follows all commands speech and language fluent Cranial nerve II-XII: Pupils were equal round reactive to light. Extraocular movements were full, visual field were full on confrontational test. Facial sensation and strength were normal. Uvula tongue midline. Head turning and shoulder shrug  were normal and symmetric. Motor: The motor testing reveals 4 over 5 strength of all 4 extremities. Global weakness. Left hand tremor  Sensory: Sensory testing is intact to soft touch on all 4 extremities. No evidence of extinction is noted.  Gait and station: Unable to  walk, today due to instability. In wheelchair.  Reflexes: Deep tendon reflexes are symmetric and normal bilaterally.    DIAGNOSTIC DATA (LABS, IMAGING, TESTING) - I reviewed patient records, labs, notes, testing and imaging myself where available.  Lab Results  Component Value Date   WBC 6.1 04/02/2024   HGB 12.8 (L) 04/02/2024   HCT 37.5 (L) 04/02/2024   MCV 100.8 (H) 04/02/2024   PLT 203 04/02/2024      Component Value Date/Time   NA 139 04/02/2024 2140   NA 141 09/16/2018 1115   K 4.1 04/02/2024 2140   CL 103 04/02/2024 2140   CO2 25 04/02/2024 2140   GLUCOSE 99 04/02/2024 2140   BUN 23 04/02/2024 2140   BUN 25 09/16/2018 1115   CREATININE 1.09 04/02/2024 2140   CREATININE 1.19 (H) 10/21/2016 0953   CALCIUM  9.5 04/02/2024 2140   PROT 6.7 03/03/2023 1153   PROT 6.8 09/16/2018 1115   ALBUMIN 4.8 03/03/2023 1153   ALBUMIN 5.0 (H) 09/16/2018 1115   AST 16 03/03/2023 1153   ALT <5 03/03/2023 1153   ALKPHOS 58 03/03/2023 1153   BILITOT 1.2 03/03/2023 1153   BILITOT 1.0 09/16/2018 1115   GFRNONAA >60 04/02/2024 2140   GFRAA 68 09/16/2018 1115   Lab Results  Component Value Date   CHOL 118 11/04/2017   HDL 68.70 11/04/2017   LDLCALC 42 11/04/2017   TRIG 35.0 11/04/2017   CHOLHDL 2 11/04/2017   Lab Results  Component Value Date   HGBA1C 5.9 (H)  08/11/2015   Lab Results  Component Value Date   VITAMINB12 621 08/11/2015   Lab Results  Component Value Date   TSH 3.29 12/25/2017       10/20/2023    2:50 PM 04/10/2023   12:53 PM  MMSE - Mini Mental State Exam  Orientation to time 4 5  Orientation to Place 4 4  Registration 3 3  Attention/ Calculation 1 4  Recall 3 3  Language- name 2 objects 2 2  Language- repeat 1 1  Language- follow 3 step command 3 3  Language- read & follow direction 1 1  Write a sentence 1 1  Copy design 0 0  Total score 23 27         No data to display           ASSESSMENT AND PLAN  89 y.o. year old male  has a past  medical history of Ankylosing spondylitis (HCC) (dx'd ~ 1974), Arthritis, BENIGN PROSTATIC HYPERTROPHY, WITH OBSTRUCTION (01/15/2010), Bleeding duodenal ulcer, Bleeding esophageal ulcer, Bleeding stomach ulcer, GERD (02/22/2008), History of blood transfusion (08/26/1986), History of hiatal hernia, HYPERGLYCEMIA (11/18/2007), HYPERLIPIDEMIA (02/22/2008), HYPERTENSION, UNSPECIFIED (11/10/2009), Kidney stones, Osteoarthritis, Parkinson's disease (HCC), PEPTIC ULCER DISEASE (11/17/2007), Situational depression, and TIA (transient ischemic attack). here with    No diagnosis found.   GERAN HAITHCOCK feels that PD symptoms continue to progress. He is tolerating and will continue carb/levo 1 tablet five times daily. We have discussed trial of Nuplazid  and will discuss with Dr Buck. I will place orders for U Step Walker per PT/OT recommendations as he was able to be more active with less falls while using loaner walker. He will continue close follow up with PT/OT. Consider resuming exercise program once PT/OT is finished. Fall precautions advised. I have encouraged him to resume CPAP use asap. Healthy lifestyle habits encouraged. He will follow up with PCP as directed. He will return to Dr Buck in 3-4 months, sooner if needed. He verbalizes understanding and agreement with this plan.    No orders of the defined types were placed in this encounter.    No orders of the defined types were placed in this encounter.   I spent 45 minutes of face-to-face and non-face-to-face time with patient.  This included previsit chart review, lab review, study review, order entry, electronic health record documentation, patient education.   Casey Forbes, MSN, FNP-C 09/02/2024, 10:00 PM  [facility] [address] [place], [place] [address] [phone]  "

## 2024-09-02 NOTE — Patient Instructions (Signed)

## 2024-09-06 ENCOUNTER — Ambulatory Visit: Admitting: Family Medicine

## 2024-09-06 ENCOUNTER — Encounter: Payer: Self-pay | Admitting: Family Medicine

## 2024-09-06 VITALS — BP 116/82 | HR 91 | Ht 67.0 in | Wt 161.0 lb

## 2024-09-06 DIAGNOSIS — K5909 Other constipation: Secondary | ICD-10-CM

## 2024-09-06 DIAGNOSIS — G20C Parkinsonism, unspecified: Secondary | ICD-10-CM | POA: Diagnosis not present

## 2024-09-06 DIAGNOSIS — G4733 Obstructive sleep apnea (adult) (pediatric): Secondary | ICD-10-CM

## 2024-09-06 DIAGNOSIS — K117 Disturbances of salivary secretion: Secondary | ICD-10-CM

## 2024-09-06 DIAGNOSIS — Z789 Other specified health status: Secondary | ICD-10-CM

## 2024-09-06 DIAGNOSIS — R296 Repeated falls: Secondary | ICD-10-CM | POA: Diagnosis not present

## 2024-09-06 DIAGNOSIS — R2689 Other abnormalities of gait and mobility: Secondary | ICD-10-CM | POA: Diagnosis not present

## 2024-09-06 DIAGNOSIS — R531 Weakness: Secondary | ICD-10-CM

## 2024-09-06 DIAGNOSIS — R443 Hallucinations, unspecified: Secondary | ICD-10-CM

## 2024-09-07 ENCOUNTER — Telehealth: Payer: Self-pay | Admitting: Family Medicine

## 2024-09-07 NOTE — Telephone Encounter (Signed)
 [facility] will be taking Patients

## 2024-09-09 NOTE — Telephone Encounter (Signed)
 Stacy from [facility] called requesting VO for PT with the frequency of 1x9 She is also wanting to put in for the OT Eval Voicemail confidential.

## 2024-09-09 NOTE — Telephone Encounter (Signed)
 Spoke to stacy from [facility] gave verbal order for PT for 1 x weekly for 9 weeks. [facility] will go and evaluate patient for OT will call back for recommendations

## 2024-09-09 NOTE — Telephone Encounter (Signed)
 Ok to give VO for therapies as recommended.

## 2024-09-13 NOTE — Telephone Encounter (Signed)
 Malorie From [facility] called to request verbal for patient  OT   Frequency  1x-6w  Callback  [phone]

## 2024-09-14 NOTE — Telephone Encounter (Signed)
 Left secure detailed msg that Dr. Buck is agreeable to verbal orders

## 2024-09-14 NOTE — Telephone Encounter (Signed)
 Okay to give verbal orders as requested.

## 2024-10-28 ENCOUNTER — Ambulatory Visit: Admitting: Cardiology

## 2024-11-03 ENCOUNTER — Ambulatory Visit

## 2024-11-03 ENCOUNTER — Ambulatory Visit: Attending: Neurology

## 2025-01-19 ENCOUNTER — Ambulatory Visit: Admitting: Neurology
